# Patient Record
Sex: Female | Born: 1975 | Race: Black or African American | Hispanic: No | Marital: Single | Smoking: Former smoker
Health system: Southern US, Community
[De-identification: ages and names within clinical notes are randomized; demographics above are authoritative.]

## PROBLEM LIST (undated history)

## (undated) DIAGNOSIS — Z9289 Personal history of other medical treatment: Secondary | ICD-10-CM

## (undated) DIAGNOSIS — J309 Allergic rhinitis, unspecified: Secondary | ICD-10-CM

## (undated) DIAGNOSIS — F329 Major depressive disorder, single episode, unspecified: Secondary | ICD-10-CM

## (undated) DIAGNOSIS — D869 Sarcoidosis, unspecified: Secondary | ICD-10-CM

## (undated) DIAGNOSIS — K219 Gastro-esophageal reflux disease without esophagitis: Secondary | ICD-10-CM

## (undated) DIAGNOSIS — K279 Peptic ulcer, site unspecified, unspecified as acute or chronic, without hemorrhage or perforation: Secondary | ICD-10-CM

## (undated) DIAGNOSIS — J189 Pneumonia, unspecified organism: Secondary | ICD-10-CM

## (undated) DIAGNOSIS — R0602 Shortness of breath: Secondary | ICD-10-CM

## (undated) DIAGNOSIS — E119 Type 2 diabetes mellitus without complications: Secondary | ICD-10-CM

## (undated) DIAGNOSIS — I83819 Varicose veins of unspecified lower extremities with pain: Secondary | ICD-10-CM

## (undated) DIAGNOSIS — H6690 Otitis media, unspecified, unspecified ear: Secondary | ICD-10-CM

## (undated) DIAGNOSIS — D860 Sarcoidosis of lung: Secondary | ICD-10-CM

## (undated) DIAGNOSIS — M1990 Unspecified osteoarthritis, unspecified site: Secondary | ICD-10-CM

## (undated) DIAGNOSIS — F32A Depression, unspecified: Secondary | ICD-10-CM

## (undated) DIAGNOSIS — D649 Anemia, unspecified: Secondary | ICD-10-CM

## (undated) DIAGNOSIS — F419 Anxiety disorder, unspecified: Secondary | ICD-10-CM

## (undated) DIAGNOSIS — N920 Excessive and frequent menstruation with regular cycle: Secondary | ICD-10-CM

## (undated) DIAGNOSIS — J449 Chronic obstructive pulmonary disease, unspecified: Secondary | ICD-10-CM

## (undated) DIAGNOSIS — I10 Essential (primary) hypertension: Secondary | ICD-10-CM

## (undated) DIAGNOSIS — R768 Other specified abnormal immunological findings in serum: Secondary | ICD-10-CM

## (undated) DIAGNOSIS — E785 Hyperlipidemia, unspecified: Secondary | ICD-10-CM

## (undated) HISTORY — PX: WISDOM TOOTH EXTRACTION: SHX21

## (undated) HISTORY — DX: Hyperlipidemia, unspecified: E78.5

## (undated) HISTORY — DX: Allergic rhinitis, unspecified: J30.9

## (undated) HISTORY — DX: Gastro-esophageal reflux disease without esophagitis: K21.9

## (undated) HISTORY — DX: Morbid (severe) obesity due to excess calories: E66.01

## (undated) HISTORY — DX: Type 2 diabetes mellitus without complications: E11.9

## (undated) HISTORY — DX: Varicose veins of unspecified lower extremity with pain: I83.819

## (undated) HISTORY — DX: Essential (primary) hypertension: I10

## (undated) HISTORY — DX: Major depressive disorder, single episode, unspecified: F32.9

## (undated) HISTORY — DX: Anemia, unspecified: D64.9

## (undated) HISTORY — DX: Sarcoidosis, unspecified: D86.9

## (undated) HISTORY — PX: HERNIA REPAIR: SHX51

## (undated) HISTORY — DX: Peptic ulcer, site unspecified, unspecified as acute or chronic, without hemorrhage or perforation: K27.9

## (undated) HISTORY — DX: Otitis media, unspecified, unspecified ear: H66.90

## (undated) HISTORY — PX: ABDOMINAL HYSTERECTOMY: SHX81

## (undated) HISTORY — DX: Depression, unspecified: F32.A

## (undated) HISTORY — DX: Other specified abnormal immunological findings in serum: R76.8

## (undated) HISTORY — DX: Anxiety disorder, unspecified: F41.9

## (undated) HISTORY — DX: Excessive and frequent menstruation with regular cycle: N92.0

---

## 1998-05-07 ENCOUNTER — Emergency Department (HOSPITAL_COMMUNITY): Admission: EM | Admit: 1998-05-07 | Discharge: 1998-05-07 | Payer: Self-pay

## 1998-10-01 ENCOUNTER — Emergency Department (HOSPITAL_COMMUNITY): Admission: EM | Admit: 1998-10-01 | Discharge: 1998-10-02 | Payer: Self-pay | Admitting: Emergency Medicine

## 1999-02-22 ENCOUNTER — Emergency Department (HOSPITAL_COMMUNITY): Admission: EM | Admit: 1999-02-22 | Discharge: 1999-02-22 | Payer: Self-pay

## 1999-07-19 ENCOUNTER — Emergency Department (HOSPITAL_COMMUNITY): Admission: EM | Admit: 1999-07-19 | Discharge: 1999-07-19 | Payer: Self-pay | Admitting: Emergency Medicine

## 2000-03-15 ENCOUNTER — Emergency Department (HOSPITAL_COMMUNITY): Admission: EM | Admit: 2000-03-15 | Discharge: 2000-03-15 | Payer: Self-pay | Admitting: Emergency Medicine

## 2000-04-09 ENCOUNTER — Emergency Department (HOSPITAL_COMMUNITY): Admission: EM | Admit: 2000-04-09 | Discharge: 2000-04-09 | Payer: Self-pay | Admitting: Emergency Medicine

## 2000-06-19 ENCOUNTER — Emergency Department (HOSPITAL_COMMUNITY): Admission: EM | Admit: 2000-06-19 | Discharge: 2000-06-19 | Payer: Self-pay | Admitting: Emergency Medicine

## 2001-07-13 ENCOUNTER — Emergency Department (HOSPITAL_COMMUNITY): Admission: EM | Admit: 2001-07-13 | Discharge: 2001-07-13 | Payer: Self-pay | Admitting: Emergency Medicine

## 2001-11-20 ENCOUNTER — Emergency Department (HOSPITAL_COMMUNITY): Admission: EM | Admit: 2001-11-20 | Discharge: 2001-11-20 | Payer: Self-pay | Admitting: *Deleted

## 2003-10-26 ENCOUNTER — Emergency Department (HOSPITAL_COMMUNITY): Admission: EM | Admit: 2003-10-26 | Discharge: 2003-10-26 | Payer: Self-pay | Admitting: Emergency Medicine

## 2005-03-19 ENCOUNTER — Ambulatory Visit: Payer: Self-pay | Admitting: Internal Medicine

## 2005-04-15 ENCOUNTER — Ambulatory Visit: Payer: Self-pay | Admitting: Internal Medicine

## 2005-04-16 ENCOUNTER — Ambulatory Visit: Payer: Self-pay | Admitting: Internal Medicine

## 2005-06-24 ENCOUNTER — Ambulatory Visit: Payer: Self-pay | Admitting: Internal Medicine

## 2005-10-12 ENCOUNTER — Emergency Department (HOSPITAL_COMMUNITY): Admission: EM | Admit: 2005-10-12 | Discharge: 2005-10-12 | Payer: Self-pay | Admitting: Emergency Medicine

## 2006-03-14 ENCOUNTER — Ambulatory Visit: Payer: Self-pay | Admitting: Internal Medicine

## 2006-04-08 ENCOUNTER — Ambulatory Visit: Payer: Self-pay | Admitting: Internal Medicine

## 2006-09-09 ENCOUNTER — Emergency Department (HOSPITAL_COMMUNITY): Admission: EM | Admit: 2006-09-09 | Discharge: 2006-09-09 | Payer: Self-pay | Admitting: Emergency Medicine

## 2006-09-12 ENCOUNTER — Ambulatory Visit: Payer: Self-pay | Admitting: Internal Medicine

## 2006-10-06 ENCOUNTER — Ambulatory Visit: Payer: Self-pay | Admitting: Internal Medicine

## 2007-07-02 ENCOUNTER — Encounter: Payer: Self-pay | Admitting: Internal Medicine

## 2007-07-02 DIAGNOSIS — E739 Lactose intolerance, unspecified: Secondary | ICD-10-CM | POA: Insufficient documentation

## 2007-07-02 DIAGNOSIS — G43909 Migraine, unspecified, not intractable, without status migrainosus: Secondary | ICD-10-CM | POA: Insufficient documentation

## 2007-07-02 DIAGNOSIS — K279 Peptic ulcer, site unspecified, unspecified as acute or chronic, without hemorrhage or perforation: Secondary | ICD-10-CM | POA: Insufficient documentation

## 2007-07-02 DIAGNOSIS — K219 Gastro-esophageal reflux disease without esophagitis: Secondary | ICD-10-CM | POA: Insufficient documentation

## 2007-08-30 ENCOUNTER — Inpatient Hospital Stay (HOSPITAL_COMMUNITY): Admission: AD | Admit: 2007-08-30 | Discharge: 2007-08-31 | Payer: Self-pay | Admitting: Obstetrics & Gynecology

## 2007-09-01 ENCOUNTER — Ambulatory Visit (HOSPITAL_COMMUNITY): Admission: RE | Admit: 2007-09-01 | Discharge: 2007-09-01 | Payer: Self-pay | Admitting: Family Medicine

## 2007-09-13 ENCOUNTER — Encounter: Payer: Self-pay | Admitting: Obstetrics & Gynecology

## 2007-09-13 ENCOUNTER — Ambulatory Visit: Payer: Self-pay | Admitting: Obstetrics & Gynecology

## 2009-06-11 ENCOUNTER — Emergency Department (HOSPITAL_COMMUNITY): Admission: EM | Admit: 2009-06-11 | Discharge: 2009-06-11 | Payer: Self-pay | Admitting: Emergency Medicine

## 2009-12-11 ENCOUNTER — Ambulatory Visit: Payer: Self-pay | Admitting: Internal Medicine

## 2009-12-11 DIAGNOSIS — R062 Wheezing: Secondary | ICD-10-CM | POA: Insufficient documentation

## 2009-12-11 DIAGNOSIS — F17200 Nicotine dependence, unspecified, uncomplicated: Secondary | ICD-10-CM | POA: Insufficient documentation

## 2009-12-17 ENCOUNTER — Telehealth (INDEPENDENT_AMBULATORY_CARE_PROVIDER_SITE_OTHER): Payer: Self-pay | Admitting: *Deleted

## 2009-12-17 ENCOUNTER — Telehealth: Payer: Self-pay | Admitting: Internal Medicine

## 2009-12-17 DIAGNOSIS — F411 Generalized anxiety disorder: Secondary | ICD-10-CM | POA: Insufficient documentation

## 2009-12-17 DIAGNOSIS — D509 Iron deficiency anemia, unspecified: Secondary | ICD-10-CM | POA: Insufficient documentation

## 2009-12-17 DIAGNOSIS — E785 Hyperlipidemia, unspecified: Secondary | ICD-10-CM | POA: Insufficient documentation

## 2010-01-02 ENCOUNTER — Ambulatory Visit: Payer: Self-pay | Admitting: Internal Medicine

## 2010-01-16 ENCOUNTER — Encounter (INDEPENDENT_AMBULATORY_CARE_PROVIDER_SITE_OTHER): Payer: Self-pay | Admitting: *Deleted

## 2010-01-16 ENCOUNTER — Ambulatory Visit: Payer: Self-pay | Admitting: Internal Medicine

## 2010-01-16 DIAGNOSIS — H6690 Otitis media, unspecified, unspecified ear: Secondary | ICD-10-CM | POA: Insufficient documentation

## 2010-01-22 ENCOUNTER — Encounter: Payer: Self-pay | Admitting: Internal Medicine

## 2010-03-19 ENCOUNTER — Ambulatory Visit: Payer: Self-pay | Admitting: Internal Medicine

## 2010-03-25 HISTORY — PX: OTHER SURGICAL HISTORY: SHX169

## 2010-04-21 ENCOUNTER — Ambulatory Visit (HOSPITAL_COMMUNITY): Admission: RE | Admit: 2010-04-21 | Discharge: 2010-04-21 | Payer: Self-pay | Admitting: Obstetrics and Gynecology

## 2010-05-05 ENCOUNTER — Ambulatory Visit: Payer: Self-pay | Admitting: Internal Medicine

## 2010-05-05 DIAGNOSIS — R109 Unspecified abdominal pain: Secondary | ICD-10-CM | POA: Insufficient documentation

## 2010-05-05 DIAGNOSIS — R509 Fever, unspecified: Secondary | ICD-10-CM | POA: Insufficient documentation

## 2010-05-05 LAB — CONVERTED CEMR LAB
Bilirubin Urine: NEGATIVE
Leukocytes, UA: NEGATIVE
Total Protein, Urine: NEGATIVE mg/dL

## 2010-05-13 ENCOUNTER — Observation Stay (HOSPITAL_COMMUNITY): Admission: EM | Admit: 2010-05-13 | Discharge: 2010-05-13 | Payer: Self-pay | Admitting: Emergency Medicine

## 2010-05-15 ENCOUNTER — Ambulatory Visit: Payer: Self-pay | Admitting: Internal Medicine

## 2010-05-15 ENCOUNTER — Encounter (INDEPENDENT_AMBULATORY_CARE_PROVIDER_SITE_OTHER): Payer: Self-pay | Admitting: *Deleted

## 2010-05-15 DIAGNOSIS — J189 Pneumonia, unspecified organism: Secondary | ICD-10-CM | POA: Insufficient documentation

## 2010-05-15 DIAGNOSIS — N920 Excessive and frequent menstruation with regular cycle: Secondary | ICD-10-CM | POA: Insufficient documentation

## 2010-05-15 LAB — CONVERTED CEMR LAB
HCT: 29 % — ABNORMAL LOW (ref 36.0–46.0)
Hemoglobin: 9 g/dL — ABNORMAL LOW (ref 12.0–15.0)
Lymphocytes Relative: 15.7 % (ref 12.0–46.0)
Lymphs Abs: 1 10*3/uL (ref 0.7–4.0)
MCV: 61.6 fL — ABNORMAL LOW (ref 78.0–100.0)
Neutro Abs: 4.5 10*3/uL (ref 1.4–7.7)
Platelets: 535 10*3/uL — ABNORMAL HIGH (ref 150.0–400.0)
WBC: 6.4 10*3/uL (ref 4.5–10.5)

## 2010-05-19 ENCOUNTER — Encounter: Payer: Self-pay | Admitting: Internal Medicine

## 2010-05-19 ENCOUNTER — Ambulatory Visit: Payer: Self-pay | Admitting: Internal Medicine

## 2010-05-19 ENCOUNTER — Encounter (INDEPENDENT_AMBULATORY_CARE_PROVIDER_SITE_OTHER): Payer: Self-pay | Admitting: *Deleted

## 2010-05-19 DIAGNOSIS — B373 Candidiasis of vulva and vagina: Secondary | ICD-10-CM | POA: Insufficient documentation

## 2010-05-19 DIAGNOSIS — R197 Diarrhea, unspecified: Secondary | ICD-10-CM | POA: Insufficient documentation

## 2010-05-19 LAB — CONVERTED CEMR LAB
ALT: 26 units/L (ref 0–35)
Albumin: 3.6 g/dL (ref 3.5–5.2)
Basophils Relative: 0.9 % (ref 0.0–3.0)
Calcium: 9 mg/dL (ref 8.4–10.5)
Chloride: 111 meq/L (ref 96–112)
GFR calc non Af Amer: 107.25 mL/min (ref >60)
HCT: 27.6 % — ABNORMAL LOW (ref 36.0–46.0)
Hemoglobin: 8.6 g/dL — ABNORMAL LOW (ref 12.0–15.0)
Hgb A1c MFr Bld: 5.8 % (ref 4.6–6.5)
Lymphocytes Relative: 18.3 % (ref 12.0–46.0)
MCV: 61.4 fL — ABNORMAL LOW (ref 78.0–100.0)
Monocytes Relative: 6.1 % (ref 3.0–12.0)
Neutro Abs: 6.1 10*3/uL (ref 1.4–7.7)
Nitrite: NEGATIVE
Platelets: 432 10*3/uL — ABNORMAL HIGH (ref 150.0–400.0)
Potassium: 3.9 meq/L (ref 3.5–5.1)
RBC: 4.49 M/uL (ref 3.87–5.11)
RDW: 21 % — ABNORMAL HIGH (ref 11.5–14.6)
Sodium: 138 meq/L (ref 135–145)
Specific Gravity, Urine: >=1.03 (ref 1.000–1.030)
Total Protein: 7.3 g/dL (ref 6.0–8.3)
Urobilinogen, UA: 0.2 (ref 0.0–1.0)

## 2010-05-20 ENCOUNTER — Ambulatory Visit: Payer: Self-pay | Admitting: Internal Medicine

## 2010-05-21 ENCOUNTER — Encounter: Payer: Self-pay | Admitting: Internal Medicine

## 2010-05-21 ENCOUNTER — Ambulatory Visit: Payer: Self-pay | Admitting: Pulmonary Disease

## 2010-05-21 DIAGNOSIS — R599 Enlarged lymph nodes, unspecified: Secondary | ICD-10-CM | POA: Insufficient documentation

## 2010-05-21 DIAGNOSIS — J8409 Other alveolar and parieto-alveolar conditions: Secondary | ICD-10-CM | POA: Insufficient documentation

## 2010-05-28 ENCOUNTER — Ambulatory Visit: Payer: Self-pay | Admitting: Internal Medicine

## 2010-05-28 ENCOUNTER — Telehealth (INDEPENDENT_AMBULATORY_CARE_PROVIDER_SITE_OTHER): Payer: Self-pay | Admitting: *Deleted

## 2010-05-28 ENCOUNTER — Encounter (INDEPENDENT_AMBULATORY_CARE_PROVIDER_SITE_OTHER): Payer: Self-pay | Admitting: *Deleted

## 2010-05-28 ENCOUNTER — Encounter: Payer: Self-pay | Admitting: Pulmonary Disease

## 2010-05-28 DIAGNOSIS — R0602 Shortness of breath: Secondary | ICD-10-CM | POA: Insufficient documentation

## 2010-05-28 LAB — CONVERTED CEMR LAB
ALT: 20 units/L (ref 0–35)
AST: 23 units/L (ref 0–37)
Albumin: 3.7 g/dL (ref 3.5–5.2)
Alkaline Phosphatase: 65 units/L (ref 39–117)
Basophils Absolute: 0.1 10*3/uL (ref 0.0–0.1)
Basophils Relative: 0.9 % (ref 0.0–3.0)
Bilirubin, Direct: 0.2 mg/dL (ref 0.0–0.3)
CO2: 21 meq/L (ref 19–32)
Chloride: 107 meq/L (ref 96–112)
Eosinophils Absolute: 0.1 10*3/uL (ref 0.0–0.7)
HCT: 28.6 % — ABNORMAL LOW (ref 36.0–46.0)
Hemoglobin: 9 g/dL — ABNORMAL LOW (ref 12.0–15.0)
Lymphs Abs: 0.9 10*3/uL (ref 0.7–4.0)
Neutrophils Relative %: 81.1 % — ABNORMAL HIGH (ref 43.0–77.0)
Platelets: 365 10*3/uL (ref 150.0–400.0)
Potassium: 4.3 meq/L (ref 3.5–5.1)
RBC: 4.7 M/uL (ref 3.87–5.11)
Sed Rate: 36 mm/hr — ABNORMAL HIGH (ref 0–22)
Sodium: 138 meq/L (ref 135–145)
Total Protein: 7.5 g/dL (ref 6.0–8.3)
WBC: 8.8 10*3/uL (ref 4.5–10.5)

## 2010-06-03 ENCOUNTER — Ambulatory Visit: Payer: Self-pay | Admitting: Pulmonary Disease

## 2010-06-03 ENCOUNTER — Ambulatory Visit: Admission: RE | Admit: 2010-06-03 | Discharge: 2010-06-03 | Payer: Self-pay | Admitting: Pulmonary Disease

## 2010-06-08 ENCOUNTER — Ambulatory Visit: Payer: Self-pay | Admitting: Pulmonary Disease

## 2010-06-08 DIAGNOSIS — D869 Sarcoidosis, unspecified: Secondary | ICD-10-CM | POA: Insufficient documentation

## 2010-06-19 ENCOUNTER — Ambulatory Visit: Payer: Self-pay | Admitting: Internal Medicine

## 2010-06-19 DIAGNOSIS — G4700 Insomnia, unspecified: Secondary | ICD-10-CM | POA: Insufficient documentation

## 2010-06-24 ENCOUNTER — Ambulatory Visit: Payer: Self-pay | Admitting: Internal Medicine

## 2010-06-28 ENCOUNTER — Emergency Department (HOSPITAL_COMMUNITY): Admission: EM | Admit: 2010-06-28 | Discharge: 2010-06-28 | Payer: Self-pay | Admitting: Family Medicine

## 2010-07-07 ENCOUNTER — Telehealth: Payer: Self-pay | Admitting: Internal Medicine

## 2010-07-07 ENCOUNTER — Ambulatory Visit: Payer: Self-pay | Admitting: Internal Medicine

## 2010-07-07 DIAGNOSIS — R10816 Epigastric abdominal tenderness: Secondary | ICD-10-CM | POA: Insufficient documentation

## 2010-07-07 DIAGNOSIS — R350 Frequency of micturition: Secondary | ICD-10-CM | POA: Insufficient documentation

## 2010-07-07 DIAGNOSIS — B370 Candidal stomatitis: Secondary | ICD-10-CM | POA: Insufficient documentation

## 2010-07-07 LAB — CONVERTED CEMR LAB
Albumin: 4.1 g/dL (ref 3.5–5.2)
Basophils Relative: 0.2 % (ref 0.0–3.0)
Bilirubin Urine: NEGATIVE
Bilirubin, Direct: 0.1 mg/dL (ref 0.0–0.3)
Eosinophils Relative: 1 % (ref 0.0–5.0)
Glucose, Bld: 621 mg/dL (ref 70–99)
Hemoglobin: 10.8 g/dL — ABNORMAL LOW (ref 12.0–15.0)
Iron: 28 ug/dL — ABNORMAL LOW (ref 42–145)
Ketones, ur: NEGATIVE mg/dL
Leukocytes, UA: NEGATIVE
MCHC: 31.8 g/dL (ref 30.0–36.0)
Neutrophils Relative %: 81.7 % — ABNORMAL HIGH (ref 43.0–77.0)
Nitrite: NEGATIVE
Platelets: 354 10*3/uL (ref 150.0–400.0)
Potassium: 4.3 meq/L (ref 3.5–5.1)
RBC: 5.25 M/uL — ABNORMAL HIGH (ref 3.87–5.11)
RDW: 23.1 % — ABNORMAL HIGH (ref 11.5–14.6)
Saturation Ratios: 5.7 % — ABNORMAL LOW (ref 20.0–50.0)
Sed Rate: 16 mm/hr (ref 0–22)
Sodium: 136 meq/L (ref 135–145)
Total Bilirubin: 0.5 mg/dL (ref 0.3–1.2)
Transferrin: 349.2 mg/dL (ref 212.0–360.0)
Urobilinogen, UA: 0.2 (ref 0.0–1.0)
WBC: 6.7 10*3/uL (ref 4.5–10.5)
pH: 5.5 (ref 5.0–8.0)

## 2010-07-09 ENCOUNTER — Encounter: Payer: Self-pay | Admitting: Pulmonary Disease

## 2010-07-10 ENCOUNTER — Ambulatory Visit: Payer: Self-pay | Admitting: Pulmonary Disease

## 2010-07-26 ENCOUNTER — Encounter: Payer: Self-pay | Admitting: Internal Medicine

## 2010-07-26 ENCOUNTER — Emergency Department (HOSPITAL_COMMUNITY): Admission: EM | Admit: 2010-07-26 | Discharge: 2010-07-26 | Payer: Self-pay | Admitting: Emergency Medicine

## 2010-07-31 ENCOUNTER — Encounter (INDEPENDENT_AMBULATORY_CARE_PROVIDER_SITE_OTHER): Payer: Self-pay | Admitting: *Deleted

## 2010-07-31 ENCOUNTER — Encounter: Payer: Self-pay | Admitting: Internal Medicine

## 2010-07-31 ENCOUNTER — Ambulatory Visit: Payer: Self-pay | Admitting: Internal Medicine

## 2010-07-31 ENCOUNTER — Telehealth: Payer: Self-pay | Admitting: Internal Medicine

## 2010-07-31 DIAGNOSIS — R609 Edema, unspecified: Secondary | ICD-10-CM | POA: Insufficient documentation

## 2010-07-31 DIAGNOSIS — J019 Acute sinusitis, unspecified: Secondary | ICD-10-CM | POA: Insufficient documentation

## 2010-07-31 DIAGNOSIS — E1140 Type 2 diabetes mellitus with diabetic neuropathy, unspecified: Secondary | ICD-10-CM | POA: Insufficient documentation

## 2010-07-31 LAB — CONVERTED CEMR LAB
CO2: 24 meq/L (ref 19–32)
Cholesterol: 157 mg/dL (ref 0–200)
Eosinophils Absolute: 0.1 10*3/uL (ref 0.0–0.7)
Eosinophils Relative: 1.9 % (ref 0.0–5.0)
HCT: 25.8 % — ABNORMAL LOW (ref 36.0–46.0)
Hemoglobin: 8.2 g/dL — ABNORMAL LOW (ref 12.0–15.0)
Hgb A1c MFr Bld: 9.9 % — ABNORMAL HIGH (ref 4.6–6.5)
MCV: 65 fL — ABNORMAL LOW (ref 78.0–100.0)
Monocytes Absolute: 0.4 10*3/uL (ref 0.1–1.0)
Monocytes Relative: 5.7 % (ref 3.0–12.0)
Platelets: 349 10*3/uL (ref 150.0–400.0)
Potassium: 3.5 meq/L (ref 3.5–5.1)
RDW: 23.3 % — ABNORMAL HIGH (ref 11.5–14.6)
Saturation Ratios: 2.7 % — ABNORMAL LOW (ref 20.0–50.0)
Transferrin: 348.9 mg/dL (ref 212.0–360.0)
VLDL: 21.2 mg/dL (ref 0.0–40.0)
WBC: 7.6 10*3/uL (ref 4.5–10.5)

## 2010-08-03 ENCOUNTER — Encounter (HOSPITAL_COMMUNITY)
Admission: RE | Admit: 2010-08-03 | Discharge: 2010-10-23 | Payer: Self-pay | Source: Home / Self Care | Attending: Internal Medicine | Admitting: Internal Medicine

## 2010-08-10 ENCOUNTER — Ambulatory Visit: Payer: Self-pay

## 2010-08-15 ENCOUNTER — Ambulatory Visit: Payer: Self-pay | Admitting: Family Medicine

## 2010-08-15 DIAGNOSIS — R5381 Other malaise: Secondary | ICD-10-CM | POA: Insufficient documentation

## 2010-08-15 DIAGNOSIS — R5383 Other fatigue: Secondary | ICD-10-CM

## 2010-09-01 ENCOUNTER — Ambulatory Visit: Payer: Self-pay | Admitting: Internal Medicine

## 2010-09-02 LAB — CONVERTED CEMR LAB
BUN: 6 mg/dL (ref 6–23)
CO2: 24 meq/L (ref 19–32)
Eosinophils Absolute: 0.4 10*3/uL (ref 0.0–0.7)
GFR calc non Af Amer: 123.35 mL/min (ref >60)
Hemoglobin: 9.7 g/dL — ABNORMAL LOW (ref 12.0–15.0)
Lymphocytes Relative: 14.4 % (ref 12.0–46.0)
Lymphs Abs: 1 10*3/uL (ref 0.7–4.0)
Neutrophils Relative %: 73.5 % (ref 43.0–77.0)
Platelets: 344 10*3/uL (ref 150.0–400.0)
Potassium: 3.4 meq/L — ABNORMAL LOW (ref 3.5–5.1)
WBC: 6.8 10*3/uL (ref 4.5–10.5)

## 2010-09-10 ENCOUNTER — Ambulatory Visit: Payer: Self-pay | Admitting: Pulmonary Disease

## 2010-09-10 DIAGNOSIS — J209 Acute bronchitis, unspecified: Secondary | ICD-10-CM | POA: Insufficient documentation

## 2010-09-14 ENCOUNTER — Encounter (INDEPENDENT_AMBULATORY_CARE_PROVIDER_SITE_OTHER): Payer: Self-pay | Admitting: *Deleted

## 2010-09-14 ENCOUNTER — Ambulatory Visit: Payer: Self-pay | Admitting: Internal Medicine

## 2010-09-14 DIAGNOSIS — F32A Depression, unspecified: Secondary | ICD-10-CM | POA: Insufficient documentation

## 2010-09-14 DIAGNOSIS — M2550 Pain in unspecified joint: Secondary | ICD-10-CM | POA: Insufficient documentation

## 2010-09-14 DIAGNOSIS — F329 Major depressive disorder, single episode, unspecified: Secondary | ICD-10-CM

## 2010-09-14 LAB — CONVERTED CEMR LAB: Sed Rate: 37 mm/hr — ABNORMAL HIGH (ref 0–22)

## 2010-09-25 ENCOUNTER — Emergency Department (HOSPITAL_COMMUNITY)
Admission: EM | Admit: 2010-09-25 | Discharge: 2010-09-25 | Payer: Self-pay | Source: Home / Self Care | Admitting: Emergency Medicine

## 2010-09-28 ENCOUNTER — Emergency Department (HOSPITAL_COMMUNITY)
Admission: EM | Admit: 2010-09-28 | Discharge: 2010-09-28 | Payer: Self-pay | Source: Home / Self Care | Admitting: Emergency Medicine

## 2010-09-28 ENCOUNTER — Telehealth: Payer: Self-pay | Admitting: Internal Medicine

## 2010-09-28 ENCOUNTER — Encounter: Payer: Self-pay | Admitting: Internal Medicine

## 2010-09-29 ENCOUNTER — Ambulatory Visit: Payer: Self-pay | Admitting: Internal Medicine

## 2010-09-29 DIAGNOSIS — L509 Urticaria, unspecified: Secondary | ICD-10-CM | POA: Insufficient documentation

## 2010-09-30 ENCOUNTER — Ambulatory Visit: Payer: Self-pay | Admitting: Internal Medicine

## 2010-09-30 ENCOUNTER — Encounter: Payer: Self-pay | Admitting: Internal Medicine

## 2010-10-01 ENCOUNTER — Telehealth: Payer: Self-pay | Admitting: Internal Medicine

## 2010-10-01 ENCOUNTER — Encounter (INDEPENDENT_AMBULATORY_CARE_PROVIDER_SITE_OTHER): Payer: Self-pay | Admitting: *Deleted

## 2010-10-02 ENCOUNTER — Ambulatory Visit: Payer: Self-pay | Admitting: Internal Medicine

## 2010-10-09 ENCOUNTER — Ambulatory Visit: Payer: Self-pay | Admitting: Pulmonary Disease

## 2010-11-15 ENCOUNTER — Encounter: Payer: Self-pay | Admitting: Internal Medicine

## 2010-11-20 ENCOUNTER — Encounter: Payer: Self-pay | Admitting: Internal Medicine

## 2010-11-24 NOTE — Progress Notes (Signed)
Summary: bronch rescheduled to 06-03-2010   ---- 05/27/2010 3:54 PM, Arman Filter LPN wrote: Nicole Melendez, this is the pt that was scheduled for tomorrow for a bronch that was cancelled.  do you want me to go ahead and call her to get her rescheduled?  if so, let me know of a date and time and i will schedule it.  ------------------------------  ---- Converted from flag ---- ---- 05/28/2010 8:37 AM, Barbaraann Share MD wrote: thanks Nicole Millet.  the other bronch could be scheduled on wed or thurs am at 730 t  called and spoke with Nicole Melendez from cardiopulmonary and rescheduled pt's bronch to [facility] August 10 at 7:30am.  called and spoke with pt and informed her of the new appt date for bronch, to arrive at [facility] outpatient admitting at 6:15am,  NPO after midnight, and she will need to have someone there with her to drive her home.  Pt verbalized understanding.  Pt requested we send a doctors note to her job at [phone] informing them that she will not be at work on 06-03-2010 d/t medical reasons/procedure.  Will forward message to [facility] to make sure he is ok to send note.  Nicole Millet Reynolds LPN  May 28, 2010 10:41 AM   ok with me.     faxed dr's note to # above.  Nicole Millet Reynolds LPN  May 28, 2010 2:08 PM

## 2010-11-24 NOTE — Letter (Signed)
Summary: Out of Work  [facility]  [address] [place], [place] [address]   Phone: [phone]  Fax: [phone]    May 28, 2010   Employee:  [employer]    To Whom It May Concern:   For Medical reasons, please excuse the above named employee from work for the following dates:  Start:   05/27/2010  End:   06/01/2010  Return to work 06/01/2010  If you need additional information, please feel free to contact our office.         Sincerely,    Dr. [name]

## 2010-11-24 NOTE — Assessment & Plan Note (Signed)
Summary: SARCODOSIS IN HAND PER RHEUMATOLOGIST/HIVES ALL OVER AND HAND.Marland KitchenMarland Kitchen   Vital Signs:  Patient profile:   35 year old female Height:      67 inches Weight:      295.50 pounds BMI:     46.45 O2 Sat:      97 % on Room air Temp:     99.1 degrees F oral Pulse rate:   88 / minute Pulse rhythm:   regular BP sitting:   104 / 68  (left arm) Cuff size:   large  Vitals Entered By: Rock Nephew CMA (September 29, 2010 1:59 PM)  O2 Flow:  Room air CC: Patient c/o hives all over body which has flared up the sarcardosis in both hands  Does patient need assistance? Functional Status Self care Ambulation Normal   Primary Care Provider:  [name] MD  CC:  Patient c/o hives all over body which has flared up the sarcardosis in both hands.  History of Present Illness: complicated pt - here for ER f/u  seen 09/25/10 for bronchitis - given Zpack then 09/28/10 developed migrating hives and hand swelling - seens in ER x 2 on 12/5 - 1st no tx as hives had improved, 2nd given pepcid and benadryl - cont rash and itch and hand swelling -  no mouth swelling or trouble breathing  also needs work note stating intermittent abscence due to medical illness  reviewed prior OV as well: here to f/u;  under more stress lately as recently step sister died realtively suddently with breast cancer, as well as much more anixety with recent reading about sarcoidosis on the internet and all the possible end organ damage;  saw dr clance with bronchtis per pt last wk, finished the avelox and has small non prod cough residual, but Pt denies CP, worsening sob, doe, wheezing, orthopnea, pnd, worsening LE edema, palps, dizziness or syncope Pt denies new neuro symptoms such as headache, facial or extremity weakness Pt denies polydipsia, polyuria, or low sugar symptoms such as shakiness improved with eating.  Overall good compliance with meds, trying to follow low chol, DM diet, wt stable, little excercise however CBG's  in the lower 100s on current meds  Unfortunately today has what appears to be new issue of  significant polyarthralgias/arthritis -  hand/wrists and ankles primarily, with 2-3 wk increased marked Increaesd AM stiffness and pain, better with running hands in hot water, and improved after 1 hr. still with swelling to LLE (I think prob some venous insufficiency)but  has tender LLE to the swollen diffuse area of the ankle Left > right  also mentions increased depressive symtpoms in the past month with lower mood, high anxiety, anhedonia, wt increase, sleeping more, less motivated on the job though not in trouble and not missed work;  not looking forward or enjoying anymore;  Denies  suicidal ideation, or panic.   Also no overt bruising or bleeding.   Clinical Review Panels:  CBC   WBC:  6.8 (09/01/2010)   RBC:  4.44 (09/01/2010)   Hgb:  9.7 (09/01/2010)   Hct:  30.0 (09/01/2010)   Platelets:  344.0 (09/01/2010)   MCV  67.6 (09/01/2010)   MCHC  32.2 (09/01/2010)   RDW  23.1 (09/01/2010)   PMN:  73.5 (09/01/2010)   Lymphs:  14.4 (09/01/2010)   Monos:  6.2 (09/01/2010)   Eosinophils:  5.6 (09/01/2010)   Basophil:  0.3 (09/01/2010)  Complete Metabolic Panel   Glucose:  94 (09/01/2010)   Sodium:  140 (09/01/2010)   Potassium:  3.4 (09/01/2010)   Chloride:  107 (09/01/2010)   CO2:  24 (09/01/2010)   BUN:  6 (09/01/2010)   Creatinine:  0.7 (09/01/2010)   Albumin:  4.1 (07/07/2010)   Total Protein:  7.2 (07/07/2010)   Calcium:  9.2 (09/01/2010)   Total Bili:  0.5 (07/07/2010)   Alk Phos:  116 (07/07/2010)   SGPT (ALT):  32 (07/07/2010)   SGOT (AST):  19 (07/07/2010)   Current Medications (verified): 1)  Omeprazole 20 Mg Cpdr (Omeprazole) .... 2 By Mouth Once Daily 2)  Meclizine Hcl 12.5 Mg Tabs (Meclizine Hcl) .Marland Kitchen.. 1po Q 6 Hrs As Needed Dizzy 3)  Alprazolam 0.5 Mg Tabs (Alprazolam) .Marland Kitchen.. 1 By Mouth Two Times A Day As Needed 4)  Fluoxetine Hcl 40 Mg Caps (Fluoxetine Hcl) .Marland Kitchen.. 1 By Mouth  Once Daily 5)  Symbicort 160-4.5 Mcg/act Aero (Budesonide-Formoterol Fumarate) .... 2 Puffs Two Times A Day 6)  Proair Hfa 108 (90 Base) Mcg/act Aers (Albuterol Sulfate) .... 2 Puffs Four Times Per Day As Needed 7)  Zolpidem Tartrate 12.5 Mg Cr-Tabs (Zolpidem Tartrate) .Marland Kitchen.. 1 By Mouth At Bedtime As Needed 8)  Onetouch Test  Strp (Glucose Blood) .... Use Asd Four Times Per Day 9)  Lancets  Misc (Lancets) .... Use Asd Four Times Per Day 10)  Janumet 50-500 Mg Tabs (Sitagliptin-Metformin Hcl) .Marland Kitchen.. 1poi Two Times A Day 11)  Ferrous Sulfate 325 (65 Fe) Mg Tbec (Ferrous Sulfate) .Marland Kitchen.. 1po Two Times A Day 12)  Furosemide 20 Mg Tabs (Furosemide) .... Take 1 Tab By Mouth Every Day 13)  Tylenol Arthritis Pain 650 Mg Cr-Tabs (Acetaminophen) .Marland Kitchen.. 1-2 By Mouth Three Times A Day As Needed 14)  Celebrex 200 Mg Caps (Celecoxib) .... Take 1 Tablet By Mouth Once A Day As Needed  Allergies (verified): 1)  ! Pcn 2)  ! Percocet  Past History:  Past Medical History: Morbid Obesity Peptic ulcer disease GERD Migraine Anxiety Anemia-iron deficiency Hyperlipidemia menorrhoagia PNA episode july 2011 GYN:  Dr Rosemary Holms Diabetes mellitus, type II - steroid related Depression sarcoid  Review of Systems  The patient denies fever, chest pain, headaches, and hemoptysis.    Physical Exam  General:  NAD, alert and overweight-appearing.  sister (?friend) at side Eyes:  vision grossly intact; pupils equal, round and reactive to light.  conjunctiva and lids normal.    Lungs:  normal respiratory effort, no intercostal retractions or use of accessory muscles; normal breath sounds bilaterally - no crackles and no wheezes.    Heart:  normal rate, regular rhythm, no murmur, and no rub. BLE without edema. Skin:  hives B forearms and trunk - swelling diffuse of B hands/digits   Impression & Recommendations:  Problem # 1:  URTICARIA (ICD-708.9)  ?azith rxn - no other new exposure triggers identified on hx pred pak x  6 days (watch DM), cont antihist as ongoing close f/u PCP in next 72h to review and ensure resolution note provided as requested  Orders: Prescription Created Electronically [phone])  Problem # 2:  PULMONARY SARCOIDOSIS (ICD-135) defer mgmt of sarcoid symptoms to pulm and rheum as ongoing -  f/u PCP re: labs as needed (ANA not done prev?) note provided to pt as requested re: intermit abscences related to medical flares of sarcoid and DM - suspect FMLA needed - defer same to PCP  Problem # 3:  DIABETES MELLITUS, TYPE II (ICD-250.00) pt to watch for exac by steroids for urticaria - call if cbg>200 Her updated  medication list for this problem includes:    Janumet 50-500 Mg Tabs (Sitagliptin-metformin hcl) .Marland Kitchen... 1poi two times a day  Labs Reviewed: Creat: 0.7 (09/01/2010)    Reviewed HgBA1c results: 9.9 (07/31/2010)  10.8 (07/07/2010)  Complete Medication List: 1)  Omeprazole 20 Mg Cpdr (Omeprazole) .... 2 by mouth once daily 2)  Meclizine Hcl 12.5 Mg Tabs (Meclizine hcl) .Marland Kitchen.. 1po q 6 hrs as needed dizzy 3)  Alprazolam 0.5 Mg Tabs (Alprazolam) .Marland Kitchen.. 1 by mouth two times a day as needed 4)  Fluoxetine Hcl 40 Mg Caps (Fluoxetine hcl) .Marland Kitchen.. 1 by mouth once daily 5)  Symbicort 160-4.5 Mcg/act Aero (Budesonide-formoterol fumarate) .... 2 puffs two times a day 6)  Proair Hfa 108 (90 Base) Mcg/act Aers (Albuterol sulfate) .... 2 puffs four times per day as needed 7)  Zolpidem Tartrate 12.5 Mg Cr-tabs (Zolpidem tartrate) .Marland Kitchen.. 1 by mouth at bedtime as needed 8)  Onetouch Test Strp (Glucose blood) .... Use asd four times per day 9)  Lancets Misc (Lancets) .... Use asd four times per day 10)  Janumet 50-500 Mg Tabs (Sitagliptin-metformin hcl) .Marland Kitchen.. 1poi two times a day 11)  Ferrous Sulfate 325 (65 Fe) Mg Tbec (Ferrous sulfate) .Marland Kitchen.. 1po two times a day 12)  Furosemide 20 Mg Tabs (Furosemide) .... Take 1 tab by mouth every day 13)  Tylenol Arthritis Pain 650 Mg Cr-tabs (Acetaminophen) .Marland Kitchen.. 1-2 by  mouth three times a day as needed 14)  Celebrex 200 Mg Caps (Celecoxib) .... Take 1 tablet by mouth once a day as needed 15)  Prednisone (pak) 10 Mg Tabs (Prednisone) .... As directed x 6 days  Patient Instructions: 1)  it was good to see you today. 2)  Pred pak x 6 days - your prescriptions have been electronically submitted to your pharmacy. Please take as directed. Contact our office if you believe you're having problems with the medication(s).  3)  stop azith and continue benadryl every 4 hours as needed for other itch/hives symptoms  4)  work note as discussed - followup with dr. Jonny Ruiz on other paperwork as needed 5)  Please schedule a follow-up appointment in 48-72h with Dr. Jonny Ruiz, call sooner if problems.  Prescriptions: PREDNISONE (PAK) 10 MG TABS (PREDNISONE) as directed x 6 days  #1 pak x 0   Entered and Authorized by:   Newt Lukes MD   Signed by:   Newt Lukes MD on 09/29/2010   Method used:   Electronically to        [facility] [phone]* (retail)       [address]       [address]       [place], [place]  [address]       Ph: [phone]       Fax: [phone]   RxID:   [id number]    Orders Added: 1)  Est. Patient Level IV [44010] 2)  Prescription Created Electronically [id number]

## 2010-11-24 NOTE — Assessment & Plan Note (Signed)
Summary: consult for pulmonary infiltrates, mediastinal LN   Copy to:  [name] Primary Provider/Referring Provider:  [name] MD  CC:  Pulmonary Consult for pneumonia. Marland Kitchen  History of Present Illness: The pt is a 35y/o female who I have been asked to see for persistent pulmonary symptoms and abnormal ct chest.  She feels that she has been ill since Feb of this year, with ongoing cough, as well as sinus and chest congestion.  She has been treated with levaquin, azithro, and doxycycline, and has not gotten back to baseline. She also was treated with steroids prior to this summer.   She recently had a panic attack with chest tightness, and went to ER a week ago where she was diagnosed with pna by cxr.  She was admitted, and a chest ct during that admit showed mediastinal and hilar LN, as well as bilateral pulmonary infiltrates with sparing deep in the bases.  She was treated with IV abx, and sent home on orals, but didn't feel any better.  She still had sob, cough that was dry, but no fever at the time of discharge.  She currently is still coughing, but is not producing mucus.  Her breathing is better, but not back to baseline.  She is on a bronchodilator regimen, but unsure if they have been helpful.  She denies any fevers, chills, or sweats.  She does have a longstanding history of smoking, but has not done so since her hospitalization.  Current Medications (verified): 1)  Omeprazole 20 Mg Cpdr (Omeprazole) .... 2 By Mouth Once Daily 2)  Meclizine Hcl 12.5 Mg Tabs (Meclizine Hcl) .Marland Kitchen.. 1po Q 6 Hrs As Needed Dizzy 3)  Alprazolam 0.25 Mg Tabs (Alprazolam) .... 1/2 - 1 By Mouth Once Daily As Needed 4)  Oxycodone Hcl 5 Mg Tabs (Oxycodone Hcl) .Marland Kitchen.. 1po Q 6 Hrs As Needed Pain 5)  Fluoxetine Hcl 20 Mg Caps (Fluoxetine Hcl) .Marland Kitchen.. 1 By Mouth Once Daily 6)  Tessalon Perles 100 Mg Caps (Benzonatate) .Marland Kitchen.. 1-2 By Mouth Three Times A Day As Needed Cough 7)  Symbicort 160-4.5 Mcg/act Aero (Budesonide-Formoterol  Fumarate) .... 2 Puffs Two Times A Day 8)  Proair Hfa 108 (90 Base) Mcg/act Aers (Albuterol Sulfate) .... 2 Puffs Four Times Per Day As Needed 9)  Metronidazole 250 Mg Tabs (Metronidazole) .Marland Kitchen.. 1 By Mouth Qid For 10 Days 10)  Fluconazole 150 Mg Tabs (Fluconazole) .Marland Kitchen.. 1po Every Other Day As Needed Yeast Infection 11)  Norethindrone Acetate 5 Mg Tabs (Norethindrone Acetate) .... Take 3 Tabs By Mouth Daily As Needed For Bleeding  Allergies (verified): 1)  ! Pcn 2)  ! Percocet  Past History:  Past Medical History: Reviewed history from 05/15/2010 and no changes required. Morbid Obesity Glucose Intolerance Peptic ulcer disease GERD Migraine Anxiety Anemia-iron deficiency glucose intolerance Hyperlipidemia menorrhoagia PNA episode july 2011  GYN:  Dr Rosemary Holms  Past Surgical History: Reviewed history from 05/05/2010 and no changes required. s/p uterine ablation  April 21, 2010  Family History: Reviewed history from 12/11/2009 and no changes required. DM HTN  allergies: mother heart disease: mother (m.i.) , father cancer: maternal aunts ( ovarian, lung, breast, bone)   Social History: Reviewed history from 05/15/2010 and no changes required. pt is single and lives with Dalbert Batman pt has no children. pt works as a Human resources officer. former smoker.  started at age 63.  less than 1 ppd.  quit July 2011. Alcohol use-yes Drug use-no  Review of Systems  The patient complains of shortness of breath with activity, non-productive cough, chest pain, weight change, abdominal pain, sore throat, tooth/dental problems, and anxiety.  The patient denies shortness of breath at rest, productive cough, coughing up blood, irregular heartbeats, acid heartburn, indigestion, loss of appetite, difficulty swallowing, headaches, nasal congestion/difficulty breathing through nose, sneezing, itching, ear ache, depression, hand/feet swelling, joint stiffness or pain, rash, change in color of mucus,  and fever.    Vital Signs:  Patient profile:   35 year old female Height:      67 inches Weight:      306.13 pounds BMI:     48.12 O2 Sat:      97 % on Room air Temp:     98.2 degrees F oral Pulse rate:   89 / minute BP sitting:   114 / 68  (right arm) Cuff size:   large  Vitals Entered By: Arman Filter LPN (May 21, 2010 10:09 AM)  O2 Flow:  Room air CC: Pulmonary Consult for pneumonia.  Comments Medications reviewed with patient Arman Filter LPN  May 21, 2010 10:17 AM    Physical Exam  General:  obese female in nad Eyes:  PERRLA and EOMI.   Nose:  patent without discharge Mouth:  clear Neck:  no jvd, tmg, LN Lungs:  minimal scattered crackles, no wheezing or rhonchi Heart:  rrr, no mrg Abdomen:  soft and nontender, bs+ Extremities:  no edema or cyanosis pulses intact distally Neurologic:  alert and oriented, moves all 4.   Impression & Recommendations:  Problem # 1:  UNSPEC ALVEOLAR&PARIETOALVEOLAR PNEUMONOPATHY (ICD-516.9)  The pt has pulmonary infiltrates that have not responded to oral and IV abx.  I suspect this is not infectious in origin, but inflammatory.  Possibilites include BOOP, eosinophilic pna, sarcoid, etc.  I suspect this is not a malignancy.  The pt will need bronchoscopy with biopsy and BAL to help with the diagnosis.  The pt is agreeable.  I have discussed the risks of bleeding, ptx, and respiratory distress with her.  Problem # 2:  MEDIASTINAL LYMPHADENOPATHY (ICD-785.6)  It is unclear if this is reactive, or is a part of the actual process.  Certainly sarcoid could be a unifying diagnosis.  Medications Added to Medication List This Visit: 1)  Norethindrone Acetate 5 Mg Tabs (Norethindrone acetate) .... Take 3 tabs by mouth daily as needed for bleeding  Other Orders: Consultation Level V (16109)  Patient Instructions: 1)  will do bronchoscopy to look into your lungs next week for diagnosis 2)  no aspirin 3 days before procedure.

## 2010-11-24 NOTE — Progress Notes (Signed)
----   Converted from flag ---- ---- 12/16/2009 8:18 PM, [name] MD wrote: need old paper chart please ------------------------------

## 2010-11-24 NOTE — Progress Notes (Signed)
Summary: Work note  Phone Note Call from Patient Call back at [employer] [phone]   Caller: Patient Summary of Call: Pt called requesting new work note to include yesterday Dec 8th. Pt states she was not able to go to work this day either due to rash and advised MD of this at OV. Fax to [phone] Initial call taken by: Margaret Pyle, CMA,  October 01, 2010 11:12 AM  Follow-up for Phone Call        Patient called back a second time to have note corrected. Out of work dates she is requesting is Dec. 8 - 12. Patient requesting a call back to confirm note completed and can pickup or fax to her work at [phone]. call back number is [phone] Follow-up by: Robin Ewing CMA Duncan Dull),  October 01, 2010 1:50 PM  Additional Follow-up for Phone Call Additional follow up Details #1::        ok    to go back to work dec 12  to [place] Additional Follow-up by: [name] MD,  October 01, 2010 4:26 PM    Additional Follow-up for Phone Call Additional follow up Details #2::    completed out of work note for pt. and called pt to inform ready for pickup. Follow-up by: Zella Ball Ewing CMA Duncan Dull),  October 01, 2010 4:35 PM

## 2010-11-24 NOTE — Letter (Signed)
Summary: Out of Work  [facility]  [address] [place], [place] [address]   Phone: [phone]  Fax: [phone]    January 02, 2010   Employee:  Nicole Melendez    To Whom It May Concern:   For Medical reasons, please excuse the above named employee from work for the following dates:  Start:   Jan 02, 2010 only              OK to return to work Jan 04, 2010 as planned without restriction  If you need additional information, please feel free to contact our office.         Sincerely,    [name] MD

## 2010-11-24 NOTE — Assessment & Plan Note (Signed)
Summary: 1 mops f/u #/cd   Vital Signs:  Patient profile:   35 year old female Height:      67 inches Weight:      302.50 pounds BMI:     47.55 O2 Sat:      98 % on Room air Temp:     98.4 degrees F oral Pulse rate:   76 / minute BP sitting:   110 / 70  (left arm) Cuff size:   large  Vitals Entered By: Zella Ball Ewing CMA Duncan Dull) (June 19, 2010 10:01 AM)  O2 Flow:  Room air CC: 1 month followup/RE   Primary Care Provider:  [name] MD  CC:  1 month followup/RE.  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 and much improved with current tx;  overall doing well except for onset difficulty with sleep primarily getting to sleep, then staying asleep more than a couple of hours at a time;  denies worsening depressive symtpoms, anxiety or panic;  no other med changes;  Pt denies CP, worsening sob, doe, wheezing, orthopnea, pnd, worsening LE edema, palps, dizziness or syncope Pt denies new neuro symptoms such as headache, facial or extremity weakness  Denies polydipsia or polyuria  Problems Prior to Update: 1)  Insomnia-sleep Disorder-unspec  (ICD-780.52) 2)  Pulmonary Sarcoidosis  (ICD-135) 3)  Dyspnea  (ICD-786.05) 4)  Mediastinal Lymphadenopathy  (ICD-785.6) 5)  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) 6)  Vaginitis, Candidal  (ICD-112.1) 7)  Diarrhea, Acute  (ICD-787.91) 8)  Menorrhagia  (ICD-626.2) 9)  Pneumonia  (ICD-486) 10)  Abdominal Pain, Lower  (ICD-789.09) 11)  Fever Unspecified  (ICD-780.60) 12)  Otitis Media, Acute, Bilateral  (ICD-382.9) 13)  Hyperlipidemia  (ICD-272.4) 14)  Glucose Intolerance  (ICD-271.3) 15)  Anemia-iron Deficiency  (ICD-280.9) 16)  Anxiety  (ICD-300.00) 17)  Smoker  (ICD-305.1) 18)  Wheezing  (ICD-786.07) 19)  Migraine Headache  (ICD-346.90) 20)  Gerd  (ICD-530.81) 21)  Peptic Ulcer Disease  (ICD-533.90) 22)  Glucose Intolerance  (ICD-271.3) 23)  Morbid Obesity   (ICD-278.01)  Medications Prior to Update: 1)  Omeprazole 20 Mg Cpdr (Omeprazole) .... 2 By Mouth Once Daily 2)  Meclizine Hcl 12.5 Mg Tabs (Meclizine Hcl) .Marland Kitchen.. 1po Q 6 Hrs As Needed Dizzy 3)  Alprazolam 0.5 Mg Tabs (Alprazolam) .Marland Kitchen.. 1 By Mouth Two Times A Day As Needed 4)  Oxycodone Hcl 5 Mg Tabs (Oxycodone Hcl) .Marland Kitchen.. 1po Q 6 Hrs As Needed Pain 5)  Fluoxetine Hcl 20 Mg Caps (Fluoxetine Hcl) .Marland Kitchen.. 1 By Mouth Once Daily 6)  Tessalon Perles 100 Mg Caps (Benzonatate) .Marland Kitchen.. 1-2 By Mouth Three Times A Day As Needed Cough 7)  Symbicort 160-4.5 Mcg/act Aero (Budesonide-Formoterol Fumarate) .... 2 Puffs Two Times A Day 8)  Proair Hfa 108 (90 Base) Mcg/act Aers (Albuterol Sulfate) .... 2 Puffs Four Times Per Day As Needed 9)  Fluconazole 150 Mg Tabs (Fluconazole) .Marland Kitchen.. 1po Every Other Day As Needed Yeast Infection 10)  Prednisone 10 Mg  Tabs (Prednisone) .... Take As Directed 11)  Prednisone 10 Mg  Tabs (Prednisone) .... 4 Each Day For Next 3 Weeks, Then Decrease To 3 Each Day Until Next Visit With Me.  Current Medications (verified): 1)  Omeprazole 20 Mg Cpdr (Omeprazole) .... 2 By Mouth Once Daily 2)  Meclizine Hcl 12.5 Mg Tabs (Meclizine Hcl) .Marland Kitchen.. 1po Q 6 Hrs As Needed Dizzy 3)  Alprazolam 0.5 Mg Tabs (Alprazolam) .Marland Kitchen.. 1 By Mouth Two Times A  Day As Needed 4)  Oxycodone Hcl 5 Mg Tabs (Oxycodone Hcl) .Marland Kitchen.. 1po Q 6 Hrs As Needed Pain 5)  Fluoxetine Hcl 20 Mg Caps (Fluoxetine Hcl) .Marland Kitchen.. 1 By Mouth Once Daily 6)  Tessalon Perles 100 Mg Caps (Benzonatate) .Marland Kitchen.. 1-2 By Mouth Three Times A Day As Needed Cough 7)  Symbicort 160-4.5 Mcg/act Aero (Budesonide-Formoterol Fumarate) .... 2 Puffs Two Times A Day 8)  Proair Hfa 108 (90 Base) Mcg/act Aers (Albuterol Sulfate) .... 2 Puffs Four Times Per Day As Needed 9)  Fluconazole 150 Mg Tabs (Fluconazole) .Marland Kitchen.. 1po Every Other Day As Needed Yeast Infection 10)  Prednisone 10 Mg  Tabs (Prednisone) .... Take As Directed 11)  Prednisone 10 Mg  Tabs (Prednisone) .... 4 Each  Day For Next 3 Weeks, Then Decrease To 3 Each Day Until Next Visit With Me. 12)  Zolpidem Tartrate 12.5 Mg Cr-Tabs (Zolpidem Tartrate) .Marland Kitchen.. 1 By Mouth At Bedtime As Needed  Allergies (verified): 1)  ! Pcn 2)  ! Percocet  Past History:  Past Medical History: Last updated: 05/15/2010 Morbid Obesity Glucose Intolerance Peptic ulcer disease GERD Migraine Anxiety Anemia-iron deficiency glucose intolerance Hyperlipidemia menorrhoagia PNA episode july 2011  GYN:  Dr Rosemary Holms  Past Surgical History: Last updated: 05/05/2010 s/p uterine ablation  April 21, 2010  Social History: Last updated: 05/21/2010 pt is single and lives with Dalbert Batman pt has no children. pt works as a Human resources officer. former smoker.  started at age 32.  less than 1 ppd.  quit July 2011. Alcohol use-yes Drug use-no  Risk Factors: Smoking Status: current (12/11/2009)  Review of Systems       all otherwise negative per pt -    Physical Exam  General:  alert and overweight-appearing.   Head:  normocephalic and atraumatic.   Eyes:  vision grossly intact, pupils equal, and pupils round.   Ears:  R ear normal and L ear normal.   Nose:  no external deformity and no nasal discharge.   Mouth:  no gingival abnormalities and pharynx pink and moist.   Neck:  supple and no masses.   Lungs:  normal respiratory effort and normal breath sounds.   Heart:  normal rate and regular rhythm.   Extremities:  no edema, no erythema  Neurologic:  cranial nerves II-XII intact and strength normal in all extremities.   Psych:  not depressed appearing and slightly anxious.     Impression & Recommendations:  Problem # 1:  INSOMNIA-SLEEP DISORDER-UNSPEC (ICD-780.52)  Her updated medication list for this problem includes:    Zolpidem Tartrate 12.5 Mg Cr-tabs (Zolpidem tartrate) .Marland Kitchen... 1 by mouth at bedtime as needed treat as above, f/u any worsening signs or symptoms   Problem # 2:  GLUCOSE INTOLERANCE  (ICD-271.3) asympt, ok to follow for now, but to report any polydipsia, polyuria, or cbg > 150-200 on steroid  Problem # 3:  DYSPNEA (ICD-786.05) marked improved with current tx for sarcoid; to f/u pulm as recommended  Complete Medication List: 1)  Omeprazole 20 Mg Cpdr (Omeprazole) .... 2 by mouth once daily 2)  Meclizine Hcl 12.5 Mg Tabs (Meclizine hcl) .Marland Kitchen.. 1po q 6 hrs as needed dizzy 3)  Alprazolam 0.5 Mg Tabs (Alprazolam) .Marland Kitchen.. 1 by mouth two times a day as needed 4)  Oxycodone Hcl 5 Mg Tabs (Oxycodone hcl) .Marland Kitchen.. 1po q 6 hrs as needed pain 5)  Fluoxetine Hcl 20 Mg Caps (Fluoxetine hcl) .Marland Kitchen.. 1 by mouth once daily 6)  Tessalon Perles 100 Mg Caps (  Benzonatate) .Marland Kitchen.. 1-2 by mouth three times a day as needed cough 7)  Symbicort 160-4.5 Mcg/act Aero (Budesonide-formoterol fumarate) .... 2 puffs two times a day 8)  Proair Hfa 108 (90 Base) Mcg/act Aers (Albuterol sulfate) .... 2 puffs four times per day as needed 9)  Fluconazole 150 Mg Tabs (Fluconazole) .Marland Kitchen.. 1po every other day as needed yeast infection 10)  Prednisone 10 Mg Tabs (Prednisone) .... Take as directed 11)  Prednisone 10 Mg Tabs (Prednisone) .... 4 each day for next 3 weeks, then decrease to 3 each day until next visit with me. 12)  Zolpidem Tartrate 12.5 Mg Cr-tabs (Zolpidem tartrate) .Marland Kitchen.. 1 by mouth at bedtime as needed  Patient Instructions: 1)  Please take all new medications as prescribed 2)  Continue all previous medications as before this visit 3)  Please schedule a follow-up appointment in 6 months or sooner if needed Prescriptions: ZOLPIDEM TARTRATE 12.5 MG CR-TABS (ZOLPIDEM TARTRATE) 1 by mouth at bedtime as needed  #30 x 5   Entered and Authorized by:   [name] MD   Signed by:   [name] MD on 06/19/2010   Method used:   Print then Give to Patient   RxID:   [id number]

## 2010-11-24 NOTE — Assessment & Plan Note (Signed)
Summary: diarrhea/cold/last ov 2007/coming for acute visit only/cd   Vital Signs:  Patient profile:   35 year old female Height:      67.5 inches Weight:      313 pounds BMI:     48.47 O2 Sat:      98 % on Room air Temp:     98.5 degrees F oral Pulse rate:   89 / minute BP sitting:   118 / 70  (left arm) Cuff size:   large  Vitals Entered ByZella Ball Ewing (December 11, 2009 11:16 AM)  O2 Flow:  Room air CC: Diarrhea, body aches, congestion, chest pain/RE   CC:  Diarrhea, body aches, congestion, and chest pain/RE.  History of Present Illness: here with acute onset 3 days flu like illness with low grade fever, headache, general weakness and malaise; mild ST, myalgias, mild right pleuritic chest pains, non prod cough but chest congestion clearish sputum;  and diarrhea watery stool without blood or orthostasis, dizziness, palp or syncope.  Does have some mild difficulty with taking a deep breath with mild wheezing at best but denies sob/doe, orthopnea, pnd or worsening LE edema.  Denies rash, joint swelling, chills, abd pains.  Also requests rx for chantix as she is now determined to quit smoking.  Declines flu shot today.  Denies polydipsia or polyuria, blurred vision or recent  known elevated blood sugars.    Preventive Screening-Counseling & Management  Alcohol-Tobacco     Smoking Status: current      Drug Use:  no.    Problems Prior to Update: 1)  Smoker  (ICD-305.1) 2)  Wheezing  (ICD-786.07) 3)  Viral Infection-unspec  (ICD-079.99) 4)  Migraine Headache  (ICD-346.90) 5)  Gerd  (ICD-530.81) 6)  Peptic Ulcer Disease  (ICD-533.90) 7)  Glucose Intolerance  (ICD-271.3) 8)  Morbid Obesity  (ICD-278.01)  Medications Prior to Update: 1)  None  Current Medications (verified): 1)  Promethazine Hcl 25 Mg Tabs (Promethazine Hcl) .Marland Kitchen.. 1 By Mouth Q 6 Hrs As Needed Nausea 2)  Lomotil 2.5-0.025 Mg Tabs (Diphenoxylate-Atropine) .Marland Kitchen.. 1 By Mouth As Needed Loose Stool (Max 8 Tabs Per  24hrs) 3)  Prednisone 10 Mg Tabs (Prednisone) .... 3po Qd For 3days, Then 2po Qd For 3days, Then 1po Qd For 3days, Then Stop 4)  Chantix Continuing Month Pak 1 Mg Tabs (Varenicline Tartrate) .... Use Asd 1 By Mouth Once Daily  Allergies (verified): 1)  ! Pcn  Past History:  Family History: Last updated: 12/11/2009 DM HTN  Social History: Last updated: 12/11/2009 Married Current Smoker Alcohol use-yes Drug use-no  Risk Factors: Smoking Status: current (12/11/2009)  Past Medical History: Morbid Obesity Glucose Intolerance Peptic ulcer disease GERD Migraine Anxiety Anemia-iron deficiency glucose intolerance Hyperlipidemia  Past Surgical History: Denies surgical history  Family History: Reviewed history and no changes required. DM HTN  Social History: Reviewed history and no changes required. Married Current Smoker Alcohol use-yes Drug use-no Smoking Status:  current Drug Use:  no  Review of Systems       all otherwise negative per pt   Physical Exam  General:  alert and overweight-appearing.   Head:  normocephalic and atraumatic.   Eyes:  vision grossly intact, pupils equal, and pupils round.   Ears:  bilat tm's red, sinus nontender Nose:  nasal dischargemucosal pallor and mucosal edema.   Mouth:  pharyngeal erythema and fair dentition.   Neck:  supple and no masses.   Lungs:  normal respiratory effort, R decreased breath sounds,  R wheezes, L decreased breath sounds, and L wheezes.   Heart:  normal rate and regular rhythm.   Abdomen:  soft, non-tender, and normal bowel sounds.   Msk:  no joint tenderness and no joint swelling.   Extremities:  no edema, no erythema  Skin:  color normal and no rashes.     Impression & Recommendations:  Problem # 1:  VIRAL INFECTION-UNSPEC (ICD-079.99) clinical exam c/w this;  declines tamiflu,  tx with rest, fluids , tylenol as needed   Problem # 2:  WHEEZING (ICD-786.07) for prednisone burst and taper off    Problem # 3:  SMOKER (ICD-305.1)  Her updated medication list for this problem includes:    Chantix Continuing Month Pak 1 Mg Tabs (Varenicline tartrate) ..... Use asd 1 by mouth once daily treat as above, f/u any worsening signs or symptoms   Problem # 4:  GLUCOSE INTOLERANCE (ICD-271.3) stable overall by hx and exam, ok to continue meds/tx as is , no meds needed at this time, pt to f/u any worsening polys on prednisone or blurred vision, declines labs today  Complete Medication List: 1)  Promethazine Hcl 25 Mg Tabs (Promethazine hcl) .Marland Kitchen.. 1 by mouth q 6 hrs as needed nausea 2)  Lomotil 2.5-0.025 Mg Tabs (Diphenoxylate-atropine) .Marland Kitchen.. 1 by mouth as needed loose stool (max 8 tabs per 24hrs) 3)  Prednisone 10 Mg Tabs (Prednisone) .... 3po qd for 3days, then 2po qd for 3days, then 1po qd for 3days, then stop 4)  Chantix Continuing Month Pak 1 Mg Tabs (Varenicline tartrate) .... Use asd 1 by mouth once daily  Patient Instructions: 1)  Please take all new medications as prescribed 2)  Continue all previous medications as before this visit  3)  You are given the note for work today 4)  Please call for any owrsening symptoms such as blurred vision, or increase thirst or urination 5)  Please schedule a follow-up appointment as needed. Prescriptions: CHANTIX CONTINUING MONTH PAK 1 MG TABS (VARENICLINE TARTRATE) use asd 1 by mouth once daily  #30 x 1   Entered and Authorized by:   [name] MD   Signed by:   [name] MD on 12/11/2009   Method used:   Print then Give to Patient   RxID:   [id number] CHANTIX STARTING MONTH PAK 0.5 MG X 11 & 1 MG X 42 TABS (VARENICLINE TARTRATE) use asd 1 by mouth once daily  #30 x 0   Entered and Authorized by:   [name] MD   Signed by:   [name] MD on 12/11/2009   Method used:   Print then Give to Patient   RxID:   [id number] PREDNISONE 10 MG TABS (PREDNISONE) 3po qd for 3days, then 2po qd for 3days, then 1po qd for 3days, then  stop  #18 x 0   Entered and Authorized by:   [name] MD   Signed by:   [name] MD on 12/11/2009   Method used:   Print then Give to Patient   RxID:   [id number] LOMOTIL 2.5-0.025 MG TABS (DIPHENOXYLATE-ATROPINE) 1 by mouth as needed loose stool (max 8 tabs per 24hrs)  #40 x 1   Entered and Authorized by:   [name] MD   Signed by:   [name] MD on 12/11/2009   Method used:   Print then Give to Patient   RxID:   [id number] PROMETHAZINE HCL 25 MG TABS (PROMETHAZINE HCL)  1 by mouth q 6 hrs as needed nausea  #40 x 1   Entered and Authorized by:   [name] MD   Signed by:   [name] MD on 12/11/2009   Method used:   Print then Give to Patient   RxID:   [id number]

## 2010-11-24 NOTE — Assessment & Plan Note (Signed)
Summary: COUGH/NWS   Vital Signs:  Patient profile:   35 year old female Height:      67 inches Weight:      308.25 pounds BMI:     48.45 O2 Sat:      97 % on Room air Temp:     100.3 degrees F oral Pulse rate:   94 / minute BP sitting:   100 / 60  (left arm) Cuff size:   large  Vitals Entered ByZella Ball Ewing (Mar 19, 2010 11:48 AM)  O2 Flow:  Room air CC: Cough/RE   CC:  Cough/RE.  History of Present Illness: here with acute onset mild to mod x 2 to 3 days onset fever, mild ST, and increaseinlyg prod ocugh with greenish sputum, and today with mild wheezing and sob/doe (but not activity limiting).  Pt denies  orthopnea, pnd, worsening LE edema, palps, dizziness or syncope  Pt denies new neuro symptoms such as headache, facial or extremity weakness , but does have marked increased anxiety recently with near panic, though she has not had to go to ER.  Worse to go to work, one xanax per day in the am prior to work seems to work well in the past.  Denies worsening depressive symptoms, or suicidal ideation.    Problems Prior to Update: 1)  Bronchitis-acute  (ICD-466.0) 2)  Otitis Media, Acute, Bilateral  (ICD-382.9) 3)  Hyperlipidemia  (ICD-272.4) 4)  Glucose Intolerance  (ICD-271.3) 5)  Anemia-iron Deficiency  (ICD-280.9) 6)  Anxiety  (ICD-300.00) 7)  Smoker  (ICD-305.1) 8)  Wheezing  (ICD-786.07) 9)  Migraine Headache  (ICD-346.90) 10)  Gerd  (ICD-530.81) 11)  Peptic Ulcer Disease  (ICD-533.90) 12)  Glucose Intolerance  (ICD-271.3) 13)  Morbid Obesity  (ICD-278.01)  Medications Prior to Update: 1)  Prednisone 10 Mg Tabs (Prednisone) .... 3po Qd For 3days, Then 2po Qd For 3days, Then 1po Qd For 3days, Then Stop 2)  Chantix Continuing Month Pak 1 Mg Tabs (Varenicline Tartrate) .... Use Asd 1 By Mouth Once Daily 3)  Doxycycline Hyclate 100 Mg Caps (Doxycycline Hyclate) .Marland Kitchen.. 1po Two Times A Day 4)  Hydrocodone-Homatropine 5-1.5 Mg/79ml Syrp (Hydrocodone-Homatropine) .Marland Kitchen.. 1 Tsp By  Mouth Q 6 Hrs As Needed Cough 5)  Omeprazole 20 Mg Cpdr (Omeprazole) .... 2 By Mouth Once Daily 6)  Meclizine Hcl 12.5 Mg Tabs (Meclizine Hcl) .Marland Kitchen.. 1po Q 6 Hrs As Needed Dizzy  Current Medications (verified): 1)  Prednisone 10 Mg Tabs (Prednisone) .... 3po Qd For 3days, Then 2po Qd For 3days, Then 1po Qd For 3days, Then Stop 2)  Chantix Continuing Month Pak 1 Mg Tabs (Varenicline Tartrate) .... Use Asd 1 By Mouth Once Daily 3)  Azithromycin 250 Mg Tabs (Azithromycin) .... 2po Qd For 1 Day, Then 1po Qd For 4days, Then Stop 4)  Hydrocodone-Homatropine 5-1.5 Mg/76ml Syrp (Hydrocodone-Homatropine) .Marland Kitchen.. 1 Tsp By Mouth Q 6 Hrs As Needed Cough 5)  Omeprazole 20 Mg Cpdr (Omeprazole) .... 2 By Mouth Once Daily 6)  Meclizine Hcl 12.5 Mg Tabs (Meclizine Hcl) .Marland Kitchen.. 1po Q 6 Hrs As Needed Dizzy 7)  Alprazolam 0.25 Mg Tabs (Alprazolam) .... 1/2 - 1 By Mouth Once Daily As Needed  Allergies (verified): 1)  ! Pcn  Past History:  Past Medical History: Last updated: 12/11/2009 Morbid Obesity Glucose Intolerance Peptic ulcer disease GERD Migraine Anxiety Anemia-iron deficiency glucose intolerance Hyperlipidemia  Past Surgical History: Last updated: 12/11/2009 Denies surgical history  Social History: Last updated: 12/11/2009 Married Current Smoker Alcohol use-yes Drug  use-no  Risk Factors: Smoking Status: current (12/11/2009)  Review of Systems       all otherwise negative per pt -    Physical Exam  General:  alert and overweight-appearing.  , mild ill  Head:  normocephalic and atraumatic.   Eyes:  vision grossly intact, pupils equal, and pupils round.   Ears:  bilat tm's mil dred, sinus nontender Nose:  nasal dischargemucosal pallor and mucosal edema.   Mouth:  pharyngeal erythema and fair dentition.   Neck:  supple and cervical lymphadenopathy.   Lungs:  normal respiratory effort, R decreased breath sounds, R wheezes, L decreased breath sounds, and L wheezes.   - mild Heart:   normal rate and regular rhythm.   Extremities:  no edema, no erythema  Psych:  not depressed appearing and moderately anxious.     Impression & Recommendations:  Problem # 1:  BRONCHITIS-ACUTE (ICD-466.0)  Her updated medication list for this problem includes:    Azithromycin 250 Mg Tabs (Azithromycin) .Marland Kitchen... 2po qd for 1 day, then 1po qd for 4days, then stop    Hydrocodone-homatropine 5-1.5 Mg/81ml Syrp (Hydrocodone-homatropine) .Marland Kitchen... 1 tsp by mouth q 6 hrs as needed cough  Orders: Depo- Medrol 40mg  (J1030) Depo- Medrol 80mg  (J1040) Admin of Therapeutic Inj  intramuscular or subcutaneous (45409) treat as above, f/u any worsening signs or symptoms mo  Problem # 2:  WHEEZING (ICD-786.07) st likely related to above, tx with depo shot, and pred pack   Problem # 3:  .stable ANXIETY (ICD-300.00)  Her updated medication list for this problem includes:    Alprazolam 0.25 Mg Tabs (Alprazolam) .Marland Kitchen... 1/2 - 1 by mouth once daily as needed , stable overall by hx and exam, ok to continue meds/tx as is   Complete Medication List: 1)  Prednisone 10 Mg Tabs (Prednisone) .... 3po qd for 3days, then 2po qd for 3days, then 1po qd for 3days, then stop 2)  Chantix Continuing Month Pak 1 Mg Tabs (Varenicline tartrate) .... Use asd 1 by mouth once daily 3)  Azithromycin 250 Mg Tabs (Azithromycin) .... 2po qd for 1 day, then 1po qd for 4days, then stop 4)  Hydrocodone-homatropine 5-1.5 Mg/33ml Syrp (Hydrocodone-homatropine) .Marland Kitchen.. 1 tsp by mouth q 6 hrs as needed cough 5)  Omeprazole 20 Mg Cpdr (Omeprazole) .... 2 by mouth once daily 6)  Meclizine Hcl 12.5 Mg Tabs (Meclizine hcl) .Marland Kitchen.. 1po q 6 hrs as needed dizzy 7)  Alprazolam 0.25 Mg Tabs (Alprazolam) .... 1/2 - 1 by mouth once daily as needed  Patient Instructions: 1)  you had the steroid shot today 2)  Please take all new medications as prescribed  3)  Continue all previous medications as before this visit  4)  Please schedule a follow-up appointment  as needed. Prescriptions: ALPRAZOLAM 0.25 MG TABS (ALPRAZOLAM) 1/2 - 1 by mouth once daily as needed  #30 x 3   Entered and Authorized by:   [name] MD   Signed by:   [name] MD on 03/19/2010   Method used:   Print then Give to Patient   RxID:   [id number] PREDNISONE 10 MG TABS (PREDNISONE) 3po qd for 3days, then 2po qd for 3days, then 1po qd for 3days, then stop  #18 x 0   Entered and Authorized by:   [name] MD   Signed by:   [name] MD on 03/19/2010   Method used:   Print then Give to Patient   RxID:  [id number] HYDROCODONE-HOMATROPINE 5-1.5 MG/5ML SYRP (HYDROCODONE-HOMATROPINE) 1 tsp by mouth q 6 hrs as needed cough  #6oz x 1   Entered and Authorized by:   [name] MD   Signed by:   [name] MD on 03/19/2010   Method used:   Print then Give to Patient   RxID:   [id number] AZITHROMYCIN 250 MG TABS (AZITHROMYCIN) 2po qd for 1 day, then 1po qd for 4days, then stop  #6 x 1   Entered and Authorized by:   [name] MD   Signed by:   [name] MD on 03/19/2010   Method used:   Print then Give to Patient   RxID:   [id number]    Medication Administration  Injection # 1:    Medication: Depo- Medrol 40mg     Diagnosis: BRONCHITIS-ACUTE (ICD-466.0)    Route: IV    Site: LUOQ gluteus    Exp Date: 08/2012    Lot #: [id number]    Mfr: [employer]    Given by: Zella Ball Ewing (Mar 19, 2010 1:40 PM)  Injection # 2:    Medication: Depo- Medrol 80mg     Diagnosis: BRONCHITIS-ACUTE (ICD-466.0)    Route: IM    Site: LUOQ gluteus    Exp Date: 08/2012    Lot #: [id number]    Mfr: [employer]    Given by: Zella Ball Ewing (Mar 19, 2010 1:41 PM)  Orders Added: 1)  Depo- Medrol 40mg  [J1030] 2)  Depo- Medrol 80mg  [J1040] 3)  Admin of Therapeutic Inj  intramuscular or subcutaneous [96372] 4)  Est. Patient Level IV [06237]

## 2010-11-24 NOTE — Letter (Signed)
Summary: Out of Work  [facility]  [address] [place], [place] [address]   Phone: [phone]  Fax: [phone]    July 31, 2010   Employee:  Nicole Melendez    To Whom It May Concern:   For Medical reasons, please excuse the above named employee from work for the following dates:  Start:   07/31/2010  End:     08/01/2010  Patient can return to work Sunday August 02, 2010  If you need additional information, please feel free to contact our office.         Sincerely,    Dr. [name]

## 2010-11-24 NOTE — Assessment & Plan Note (Signed)
Summary: acute sick visit for acute bronchitis.   Visit Type:  Follow-up Copy to:  [name] Primary Provider/Referring Provider:  [name] MD  CC:  2 month f/u. pt c/o cough, wheezing, chest tightness when coughing, fever, chills, and body aches. x 1 week.  pt states she is getting strangled. pt quit smoking 04/2010.  History of Present Illness: the pt comes in today for an acute sick visit.  She has known sarcoid, but had very poor tolerance to prednisone with significant complications.  She has been weaned off the medication, and the decision was made to follow her clinically.  She comes in with a one week h/o cough with discolored mucus, chest congestion, worsening sob, and body aches.  She also feels that she has had fever and chills.  Current Medications (verified): 1)  Omeprazole 20 Mg Cpdr (Omeprazole) .... 2 By Mouth Once Daily 2)  Meclizine Hcl 12.5 Mg Tabs (Meclizine Hcl) .Marland Kitchen.. 1po Q 6 Hrs As Needed Dizzy 3)  Alprazolam 0.5 Mg Tabs (Alprazolam) .Marland Kitchen.. 1 By Mouth Two Times A Day As Needed 4)  Fluoxetine Hcl 20 Mg Caps (Fluoxetine Hcl) .Marland Kitchen.. 1 By Mouth Once Daily 5)  Symbicort 160-4.5 Mcg/act Aero (Budesonide-Formoterol Fumarate) .... 2 Puffs Two Times A Day 6)  Proair Hfa 108 (90 Base) Mcg/act Aers (Albuterol Sulfate) .... 2 Puffs Four Times Per Day As Needed 7)  Zolpidem Tartrate 12.5 Mg Cr-Tabs (Zolpidem Tartrate) .Marland Kitchen.. 1 By Mouth At Bedtime As Needed 8)  Onetouch Test  Strp (Glucose Blood) .... Use Asd Four Times Per Day 9)  Lancets  Misc (Lancets) .... Use Asd Four Times Per Day 10)  Janumet 50-500 Mg Tabs (Sitagliptin-Metformin Hcl) .Marland Kitchen.. 1poi Two Times A Day 11)  Ferrous Sulfate 325 (65 Fe) Mg Tbec (Ferrous Sulfate) .Marland Kitchen.. 1po Two Times A Day 12)  Furosemide 20 Mg Tabs (Furosemide) .... Take 1 Tab By Mouth Every Day  Allergies (verified): 1)  ! Pcn 2)  ! Percocet  Past History:  Past medical, surgical, family and social histories (including risk factors) reviewed, and no  changes noted (except as noted below).  Past Medical History: Reviewed history from 07/31/2010 and no changes required. Morbid Obesity Glucose Intolerance Peptic ulcer disease GERD Migraine Anxiety Anemia-iron deficiency glucose intolerance Hyperlipidemia menorrhoagia PNA episode july 2011 GYN:  Dr Rosemary Holms Diabetes mellitus, type II - steroid related  Past Surgical History: Reviewed history from 05/05/2010 and no changes required. s/p uterine ablation  April 21, 2010  Family History: Reviewed history from 05/21/2010 and no changes required. DM HTN  allergies: mother heart disease: mother (m.i.) , father cancer: maternal aunts ( ovarian, lung, breast, bone)   Social History: Reviewed history from 07/31/2010 and no changes required. pt is single and lives with Dalbert Batman - also pt of [facility] pt has no children. pt works as a Human resources officer. former smoker.  started at age 43.  less than 1 ppd.  quit July 2011. Alcohol use-yes Drug use-no  Review of Systems       The patient complains of shortness of breath with activity, non-productive cough, nasal congestion/difficulty breathing through nose, and hand/feet swelling.  The patient denies shortness of breath at rest, productive cough, coughing up blood, chest pain, irregular heartbeats, acid heartburn, indigestion, loss of appetite, weight change, abdominal pain, difficulty swallowing, sore throat, tooth/dental problems, headaches, sneezing, itching, ear ache, anxiety, depression, joint stiffness or pain, rash, change in color of mucus, and fever.    Vital Signs:  Patient  profile:   35 year old female Height:      67 inches Weight:      304 pounds BMI:     47.79 O2 Sat:      98 % on Room air Temp:     98.2 degrees F oral Pulse rate:   91 / minute BP sitting:   110 / 56  (left arm) Cuff size:   large  Vitals Entered By: Carver Fila (September 10, 2010 3:31 PM)  O2 Flow:  Room air CC: 2 month f/u. pt c/o cough, wheezing,  chest tightness when coughing, fever, chills, body aches. x 1 week.  pt states she is getting strangled. pt quit smoking 04/2010 Comments meds and allergies updated Phone number updated Carver Fila  September 10, 2010 3:29 PM    Physical Exam  General:  obese female in nad Nose:  no purulence or drainage Lungs:  a few rhonchi, but no crackles or wheezing Heart:  rrr Extremities:  no significant edema or cyanosis Neurologic:  alert and oriented, moves all 4.    Impression & Recommendations:  Problem # 1:  BRONCHITIS, ACUTE (ICD-466.0) the pt's history is most suggestive of acute bronchitis.  She will need treatment with abx, but would like to hold off on steroids given her lack of bronchospasm on exam and rxn in the past.  Problem # 2:  PULMONARY SARCOIDOSIS (ICD-135) I suspect the pt's current symptoms have nothing to do with her sarcoid.  Will need to monitor her over time with pfts and cxr's, as well as her clinical progress.  I have also stressed to her the importance of weight loss.  Medications Added to Medication List This Visit: 1)  Ferrous Sulfate 325 (65 Fe) Mg Tbec (Ferrous sulfate) .Marland Kitchen.. 1po two times a day 2)  Avelox 400 Mg Tabs (Moxifloxacin hcl) .... By mouth daily  Other Orders: Est. Patient Level IV (04540) T- * Misc. Laboratory test [phone])  Patient Instructions: 1)  will treat with avelox 400mg  one each day for 5 days 2)  mucinex dm twice a day may help with cough 3)  please call me if not improved in next 3-4 days.  Prescriptions: AVELOX 400 MG  TABS (MOXIFLOXACIN HCL) By mouth daily  #3 x 0   Entered and Authorized by:   Barbaraann Share MD   Signed by:   Barbaraann Share MD on 09/10/2010   Method used:   Print then Give to Patient   RxID:   [id number]   Appended Document: acute sick visit for acute bronchitis. please let pt know we forgot to schedule f/u apptm (check IDX to see if this is the case)  would like to see her in 3 mos if doing well, sooner  if having issues.  Appended Document: acute sick visit for acute bronchitis. pt scehduled for 11/27/2010 at 9:15

## 2010-11-24 NOTE — Letter (Signed)
Summary: Generic Letter  [name]  [address] [place], [place] [address]   Phone: [phone]  Fax: [phone]    09/29/2010  [facility] [place] [address] [place], [place]  [address]  Dear [name] and whom it may concern,  Due to chronic medical conditions of diabetes and sarcoid with frequent flares of each, patient will intermittently be absent from work. Please contact her PCP Dr. [name] for further information as needed.      Sincerely,     Rene Paci MD

## 2010-11-24 NOTE — Assessment & Plan Note (Signed)
Summary: WEAK/ACHES SINCE IRON TRANSFUSION WED/NWS   Vital Signs:  Patient profile:   35 year old female Weight:      300 pounds O2 Sat:      99 % on Room air Temp:     98.7 degrees F oral Pulse rate:   92 / minute BP sitting:   108 / 70  (left arm)  Vitals Entered By: Doristine Devoid CMA (August 15, 2010 9:18 AM)  O2 Flow:  Room air CC: body aches and cough    History of Present Illness: 35 yo woman here today for body aches since iron transfusion on Wed.  this is 2nd transfusion, no problems after 1st.  taking tylenol bid w/ some relief.  denies fevers or chills.  has developed cough- intermittantly productive.  hx of pulmonary sarcoid- 'they give me abx when i get a cold to avoid flaring up the sarcoid'.  denies nasal congestion or ear pain.  does have HA.  + facial pressure.  also c/o LE edema.  reports 'they puff up real good at work'.  recently started on HCTZ- 'this don't work'.  legs will improve w/ elevation and massage.  is on her feet for her entire work shift and reports that they get tight and painful w/ increased edema.  Current Medications (verified): 1)  Omeprazole 20 Mg Cpdr (Omeprazole) .... 2 By Mouth Once Daily 2)  Meclizine Hcl 12.5 Mg Tabs (Meclizine Hcl) .Marland Kitchen.. 1po Q 6 Hrs As Needed Dizzy 3)  Alprazolam 0.5 Mg Tabs (Alprazolam) .Marland Kitchen.. 1 By Mouth Two Times A Day As Needed 4)  Fluoxetine Hcl 20 Mg Caps (Fluoxetine Hcl) .Marland Kitchen.. 1 By Mouth Once Daily 5)  Tessalon Perles 100 Mg Caps (Benzonatate) .Marland Kitchen.. 1-2 By Mouth Three Times A Day As Needed Cough 6)  Symbicort 160-4.5 Mcg/act Aero (Budesonide-Formoterol Fumarate) .... 2 Puffs Two Times A Day 7)  Proair Hfa 108 (90 Base) Mcg/act Aers (Albuterol Sulfate) .... 2 Puffs Four Times Per Day As Needed 8)  Fluconazole 150 Mg Tabs (Fluconazole) .Marland Kitchen.. 1po Every Other Day As Needed Yeast Infection 9)  Zolpidem Tartrate 12.5 Mg Cr-Tabs (Zolpidem Tartrate) .Marland Kitchen.. 1 By Mouth At Bedtime As Needed 10)  Fioricet 50-325-40 Mg Tabs  (Butalbital-Apap-Caffeine) .Marland Kitchen.. 1po Once Daily As Needed 11)  Onetouch Test  Strp (Glucose Blood) .... Use Asd Four Times Per Day 12)  Lancets  Misc (Lancets) .... Use Asd Four Times Per Day 13)  Janumet 50-500 Mg Tabs (Sitagliptin-Metformin Hcl) .Marland Kitchen.. 1poi Two Times A Day 14)  Ferrous Sulfate 325 (65 Fe) Mg Tbec (Ferrous Sulfate) .Marland Kitchen.. 1po Once Daily 15)  Venofer 20 Mg/ml Soln (Iron Sucrose) .... 5 Cc Iv Q Wk For 3 Wks 16)  Furosemide 20 Mg Tabs (Furosemide) .... Take 1 Tab By Mouth Every Day 17)  Doxycycline Hyclate 100 Mg Caps (Doxycycline Hyclate) .... Take 1 Tab Twice A Day- Take W/ Food 18)  Diflucan 150 Mg Tabs (Fluconazole) .... Once Daily If Yeast Sxs Develop.  May Repeat in 3 Days If Sxs Persist.  Allergies (verified): 1)  ! Pcn 2)  ! Percocet  Past History:  Past Medical History: Last updated: 07/31/2010 Morbid Obesity Glucose Intolerance Peptic ulcer disease GERD Migraine Anxiety Anemia-iron deficiency glucose intolerance Hyperlipidemia menorrhoagia PNA episode july 2011 GYN:  Dr Rosemary Holms Diabetes mellitus, type II - steroid related  Review of Systems      See HPI  Physical Exam  General:  alert and overweight-appearing.  NAD Head:  + TTP over frontal and  maxillary sinuses Eyes:  no injxn or inflammation Ears:  External ear exam shows no significant lesions or deformities.  Otoscopic examination reveals clear canals, tympanic membranes are intact bilaterally without bulging, retraction, inflammation or discharge. Hearing is grossly normal bilaterally. Nose:  mild congestion Mouth:  no pharyngeal erythema or exudate Neck:  supple and no masses.   Lungs:  normal respiratory effort and normal breath sounds.   no cough heard during visit  Heart:  normal rate and regular rhythm.   Pulses:  +2 DP Extremities:  trace to 1+ edema bilat   Impression & Recommendations:  Problem # 1:  SINUSITIS- ACUTE-NOS (ICD-461.9) Assessment Unchanged given PCN allergy and recent  Azithro use will start Doxy.  may be cause of pt's myalgias/malaise.  reviewed supportive care and red flags that should prompt return.  Pt expresses understanding and is in agreement w/ this plan. The following medications were removed from the medication list:    Azithromycin 250 Mg Tabs (Azithromycin) .Marland Kitchen... 2po qd for 1 day, then 1po qd for 4days, then stop Her updated medication list for this problem includes:    Tessalon Perles 100 Mg Caps (Benzonatate) .Marland Kitchen... 1-2 by mouth three times a day as needed cough    Doxycycline Hyclate 100 Mg Caps (Doxycycline hyclate) .Marland Kitchen... Take 1 tab twice a day- take w/ food  Problem # 2:  PERIPHERAL EDEMA (ICD-782.3) Assessment: Unchanged pt reports no improvement in sxs w/ HCTZ.  will stop HCTZ and start Lasix.  stressed importance of f/u w/ PCP to check K+.  pt aware and in agreement. The following medications were removed from the medication list:    Hydrochlorothiazide 12.5 Mg Caps (Hydrochlorothiazide) .Marland Kitchen... 1po once daily Her updated medication list for this problem includes:    Furosemide 20 Mg Tabs (Furosemide) .Marland Kitchen... Take 1 tab by mouth every day  Problem # 3:  MALAISE (ICD-780.79) Assessment: New pt's body aches could be side effect of iron transfusion as this occurs in >10% of pts.  may also be related to sinus infxn.  tylenol q6 for pain, tx sinusitis as discussed above.  Complete Medication List: 1)  Omeprazole 20 Mg Cpdr (Omeprazole) .... 2 by mouth once daily 2)  Meclizine Hcl 12.5 Mg Tabs (Meclizine hcl) .Marland Kitchen.. 1po q 6 hrs as needed dizzy 3)  Alprazolam 0.5 Mg Tabs (Alprazolam) .Marland Kitchen.. 1 by mouth two times a day as needed 4)  Fluoxetine Hcl 20 Mg Caps (Fluoxetine hcl) .Marland Kitchen.. 1 by mouth once daily 5)  Tessalon Perles 100 Mg Caps (Benzonatate) .Marland Kitchen.. 1-2 by mouth three times a day as needed cough 6)  Symbicort 160-4.5 Mcg/act Aero (Budesonide-formoterol fumarate) .... 2 puffs two times a day 7)  Proair Hfa 108 (90 Base) Mcg/act Aers (Albuterol sulfate)  .... 2 puffs four times per day as needed 8)  Fluconazole 150 Mg Tabs (Fluconazole) .Marland Kitchen.. 1po every other day as needed yeast infection 9)  Zolpidem Tartrate 12.5 Mg Cr-tabs (Zolpidem tartrate) .Marland Kitchen.. 1 by mouth at bedtime as needed 10)  Fioricet 50-325-40 Mg Tabs (Butalbital-apap-caffeine) .Marland Kitchen.. 1po once daily as needed 11)  Onetouch Test Strp (Glucose blood) .... Use asd four times per day 12)  Lancets Misc (Lancets) .... Use asd four times per day 13)  Janumet 50-500 Mg Tabs (Sitagliptin-metformin hcl) .Marland Kitchen.. 1poi two times a day 14)  Ferrous Sulfate 325 (65 Fe) Mg Tbec (Ferrous sulfate) .Marland Kitchen.. 1po once daily 15)  Venofer 20 Mg/ml Soln (Iron sucrose) .... 5 cc iv q wk for 3 wks 16)  Furosemide 20 Mg Tabs (Furosemide) .... Take 1 tab by mouth every day 17)  Doxycycline Hyclate 100 Mg Caps (Doxycycline hyclate) .... Take 1 tab twice a day- take w/ food 18)  Diflucan 150 Mg Tabs (Fluconazole) .... Once daily if yeast sxs develop.  may repeat in 3 days if sxs persist.  Patient Instructions: 1)  Follow up with Dr Jonny Ruiz early this week 2)  Start the Doxycycline for a sinus infxn- take w/ food 3)  STOP the hydrochlorothiazide 4)  Start the furosemide daily for the swelling 5)  Take the Diflucan if you develop yeast 6)  Take tylenol for the body aches every 6 hours 7)  Call if no improvement or concerns 8)  Hang in there! Prescriptions: DIFLUCAN 150 MG TABS (FLUCONAZOLE) once daily if yeast sxs develop.  may repeat in 3 days if sxs persist.  #2 x 0   Entered and Authorized by:   Neena Rhymes MD   Signed by:   Neena Rhymes MD on 08/15/2010   Method used:   Electronically to        [facility] [address] [phone]* (retail)       [address] [place], [place]  [address]       Ph: [phone]       Fax: [phone]   RxID:   [id number] DOXYCYCLINE HYCLATE 100 MG CAPS (DOXYCYCLINE HYCLATE) Take 1 tab twice a day- take w/ food  #20 x 0   Entered and Authorized by:   Neena Rhymes MD   Signed by:   Neena Rhymes MD on 08/15/2010   Method used:   Electronically to        [facility] [address] [phone]* (retail)       [address] [place], [place]  [address]       Ph: [phone]       Fax: [phone]   RxID:   [id number] FUROSEMIDE 20 MG TABS (FUROSEMIDE) Take 1 tab by mouth every day  #30 x 1   Entered and Authorized by:   Neena Rhymes MD   Signed by:   Neena Rhymes MD on 08/15/2010   Method used:   Electronically to        [facility] [address] [phone]* (retail)       [address] [place], [place]  [address]       Ph: [phone]       Fax: [phone]   RxID:   [id number]    Orders Added: 1)  Est. Patient Level IV [[address]]

## 2010-11-24 NOTE — Assessment & Plan Note (Signed)
Summary: headache x3 days/cd   Vital Signs:  Patient profile:   35 year old female Height:      67 inches Weight:      304.50 pounds BMI:     47.86 O2 Sat:      97 % on Room air Temp:     97.5 degrees F oral Pulse rate:   71 / minute BP sitting:   114 / 72  (left arm) Cuff size:   large  Vitals Entered By: Zella Ball Ewing CMA Duncan Dull) (June 24, 2010 2:52 PM)  O2 Flow:  Room air CC: Headache for 3 days/RE   Primary Care Provider:  [name] MD  CC:  Headache for 3 days/RE.  History of Present Illness: here with acute onset 3 days mod and now severe right side headache, worse bilat temporal and occipital with throbbing, photophobia and n/v, with tylenol and flurbiprofen not working as it has in the past;  pain worse to bend over ,  assoc with mild dizziness and no syncope, on falls, injury, fever.  Pt denies CP, worsening sob, doe, wheezing, orthopnea, pnd, worsening LE edema, palps, dizziness or syncope  Pt denies new neuro symptoms such as facial or extremity weakness . 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) 6)  Vaginitis, Candidal  (ICD-112.1) 7)  Diarrhea, Acute  (ICD-787.91) 8)  Menorrhagia  (ICD-626.2) 9)  Pneumonia  (ICD-486) 10)  Abdominal Pain, Lower  (ICD-789.09) 11)  Fever Unspecified  (ICD-780.60) 12)  Otitis Media, Acute, Bilateral  (ICD-382.9) 13)  Hyperlipidemia  (ICD-272.4) 14)  Glucose Intolerance  (ICD-271.3) 15)  Anemia-iron Deficiency  (ICD-280.9) 16)  Anxiety  (ICD-300.00) 17)  Smoker  (ICD-305.1) 18)  Wheezing  (ICD-786.07) 19)  Migraine Headache  (ICD-346.90) 20)  Gerd  (ICD-530.81) 21)  Peptic Ulcer Disease  (ICD-533.90) 22)  Glucose Intolerance   (ICD-271.3) 23)  Morbid Obesity  (ICD-278.01)  Medications Prior to Update: 1)  Omeprazole 20 Mg Cpdr (Omeprazole) .... 2 By Mouth Once Daily 2)  Meclizine Hcl 12.5 Mg Tabs (Meclizine Hcl) .Marland Kitchen.. 1po Q 6 Hrs As Needed Dizzy 3)  Alprazolam 0.5 Mg Tabs (Alprazolam) .Marland Kitchen.. 1 By Mouth Two Times A Day As Needed 4)  Oxycodone Hcl 5 Mg Tabs (Oxycodone Hcl) .Marland Kitchen.. 1po Q 6 Hrs As Needed Pain 5)  Fluoxetine Hcl 20 Mg Caps (Fluoxetine Hcl) .Marland Kitchen.. 1 By Mouth Once Daily 6)  Tessalon Perles 100 Mg Caps (Benzonatate) .Marland Kitchen.. 1-2 By Mouth Three Times A Day As Needed Cough 7)  Symbicort 160-4.5 Mcg/act Aero (Budesonide-Formoterol Fumarate) .... 2 Puffs Two Times A Day 8)  Proair Hfa 108 (90 Base) Mcg/act Aers (Albuterol Sulfate) .... 2 Puffs Four Times Per Day As Needed 9)  Fluconazole 150 Mg Tabs (Fluconazole) .Marland Kitchen.. 1po Every Other Day As Needed Yeast Infection 10)  Prednisone 10 Mg  Tabs (Prednisone) .... Take As Directed 11)  Prednisone 10 Mg  Tabs (Prednisone) .... 4 Each Day For Next 3 Weeks, Then Decrease To 3 Each Day Until Next Visit With Me. 12)  Zolpidem Tartrate 12.5 Mg Cr-Tabs (Zolpidem Tartrate) .Marland Kitchen.. 1 By Mouth At Bedtime As Needed  Current Medications (verified): 1)  Omeprazole 20 Mg Cpdr (Omeprazole) .... 2 By Mouth Once Daily 2)  Meclizine Hcl 12.5 Mg  Tabs (Meclizine Hcl) .Marland Kitchen.. 1po Q 6 Hrs As Needed Dizzy 3)  Alprazolam 0.5 Mg Tabs (Alprazolam) .Marland Kitchen.. 1 By Mouth Two Times A Day As Needed 4)  Fluoxetine Hcl 20 Mg Caps (Fluoxetine Hcl) .Marland Kitchen.. 1 By Mouth Once Daily 5)  Tessalon Perles 100 Mg Caps (Benzonatate) .Marland Kitchen.. 1-2 By Mouth Three Times A Day As Needed Cough 6)  Symbicort 160-4.5 Mcg/act Aero (Budesonide-Formoterol Fumarate) .... 2 Puffs Two Times A Day 7)  Proair Hfa 108 (90 Base) Mcg/act Aers (Albuterol Sulfate) .... 2 Puffs Four Times Per Day As Needed 8)  Fluconazole 150 Mg Tabs (Fluconazole) .Marland Kitchen.. 1po Every Other Day As Needed Yeast Infection 9)  Prednisone 10 Mg  Tabs (Prednisone) .... Take As  Directed 10)  Prednisone 10 Mg  Tabs (Prednisone) .... 4 Each Day For Next 3 Weeks, Then Decrease To 3 Each Day Until Next Visit With Me. 11)  Zolpidem Tartrate 12.5 Mg Cr-Tabs (Zolpidem Tartrate) .Marland Kitchen.. 1 By Mouth At Bedtime As Needed 12)  Fioricet 50-325-40 Mg Tabs (Butalbital-Apap-Caffeine) .Marland Kitchen.. 1po Once Daily As Needed  Allergies (verified): 1)  ! Pcn 2)  ! Percocet  Past History:  Past Medical History: Last updated: 05/15/2010 Morbid Obesity Glucose Intolerance Peptic ulcer disease GERD Migraine Anxiety Anemia-iron deficiency glucose intolerance Hyperlipidemia menorrhoagia PNA episode july 2011  GYN:  Dr Rosemary Holms  Past Surgical History: Last updated: 05/05/2010 s/p uterine ablation  April 21, 2010  Social History: Last updated: 05/21/2010 pt is single and lives with Dalbert Batman pt has no children. pt works as a Human resources officer. former smoker.  started at age 43.  less than 1 ppd.  quit July 2011. Alcohol use-yes Drug use-no  Risk Factors: Smoking Status: current (12/11/2009)  Review of Systems       all otherwise negative per pt -    Physical Exam  General:  alert and overweight-appearing.   Head:  normocephalic and atraumatic.   Eyes:  vision grossly intact, pupils equal, and pupils round.   Ears:  R ear normal and L ear normal.   Nose:  no external deformity and no nasal discharge.   Mouth:  no gingival abnormalities and pharynx pink and moist.   Neck:  supple and no masses.   Lungs:  normal respiratory effort and normal breath sounds.   Heart:  normal rate and regular rhythm.   Extremities:  no edema, no erythema  Neurologic:  cranial nerves II-XII intact, strength normal in all extremities, and gait normal.     Impression & Recommendations:  Problem # 1:  MIGRAINE HEADACHE (ICD-346.90)  The following medications were removed from the medication list:    Oxycodone Hcl 5 Mg Tabs (Oxycodone hcl) .Marland Kitchen... 1po q 6 hrs as needed pain Her updated  medication list for this problem includes:    Fioricet 50-325-40 Mg Tabs (Butalbital-apap-caffeine) .Marland Kitchen... 1po once daily as needed 3 days, severe, missed work today;  for work note, demerol/phenergan 50/25 mg IM, and home with fioricet as needed   Orders: Demerol / Phenergan Injection (Y8657) Admin of Therapeutic Inj  intramuscular or subcutaneous (84696)  Complete Medication List: 1)  Omeprazole 20 Mg Cpdr (Omeprazole) .... 2 by mouth once daily 2)  Meclizine Hcl 12.5 Mg Tabs (Meclizine hcl) .Marland Kitchen.. 1po q 6 hrs as needed dizzy 3)  Alprazolam 0.5 Mg Tabs (Alprazolam) .Marland Kitchen.. 1 by mouth two times a day as needed 4)  Fluoxetine Hcl 20 Mg Caps (Fluoxetine hcl) .Marland Kitchen.. 1 by mouth once daily 5)  Tessalon Perles 100 Mg  Caps (Benzonatate) .Marland Kitchen.. 1-2 by mouth three times a day as needed cough 6)  Symbicort 160-4.5 Mcg/act Aero (Budesonide-formoterol fumarate) .... 2 puffs two times a day 7)  Proair Hfa 108 (90 Base) Mcg/act Aers (Albuterol sulfate) .... 2 puffs four times per day as needed 8)  Fluconazole 150 Mg Tabs (Fluconazole) .Marland Kitchen.. 1po every other day as needed yeast infection 9)  Prednisone 10 Mg Tabs (Prednisone) .... Take as directed 10)  Prednisone 10 Mg Tabs (Prednisone) .... 4 each day for next 3 weeks, then decrease to 3 each day until next visit with me. 11)  Zolpidem Tartrate 12.5 Mg Cr-tabs (Zolpidem tartrate) .Marland Kitchen.. 1 by mouth at bedtime as needed 12)  Fioricet 50-325-40 Mg Tabs (Butalbital-apap-caffeine) .Marland Kitchen.. 1po once daily as needed  Patient Instructions: 1)  you had the pain/nausea shot today (demerol/phenergan) 2)  you are given the work note today 3)  Please take all new medications as prescribed - the fioricet as needed further headaches (please use sparingly) 4)  Continue all previous medications as before this visit  5)  Please schedule a follow-up appointment as needed. Prescriptions: FIORICET 50-325-40 MG TABS (BUTALBITAL-APAP-CAFFEINE) 1po once daily as needed  #30 x 0   Entered  and Authorized by:   [name] MD   Signed by:   [name] MD on 06/24/2010   Method used:   Print then Give to Patient   RxID:   [id number]    Medication Administration  Injection # 1:    Medication: Demerol / Phenergan Injection    Diagnosis: MIGRAINE HEADACHE (ICD-346.90)    Route: IM    Site: LUOQ gluteus    Exp Date: 08/2011    Lot #: [id number]    Mfr: Baxter    Comments: Patient received Demerol 50mg , FAO#[id number], exp.12/24/2010 Patient received 25mg  Phenergan, QIO#[id number], exp 08/2011    Patient tolerated injection without complications    Given by: Zella Ball Ewing CMA Duncan Dull) (June 24, 2010 4:01 PM)  Orders Added: 1)  Demerol / Phenergan Injection [J2180] 2)  Admin of Therapeutic Inj  intramuscular or subcutaneous [96372] 3)  Est. Patient Level III [84132]

## 2010-11-24 NOTE — Assessment & Plan Note (Signed)
Summary: hives worst and cant put clothes on-lb   Vital Signs:  Patient profile:   35 year old female Height:      67 inches Weight:      298 pounds BMI:     46.84 O2 Sat:      96 % on Room air Temp:     98.4 degrees F oral Pulse rate:   84 / minute BP sitting:   140 / 78  (left arm) Cuff size:   large  Vitals Entered By: Zella Ball Ewing CMA Duncan Dull) (September 30, 2010 2:52 PM)  O2 Flow:  Room air CC: hives no better/RE   Primary Care Provider:  [name] MD  CC:  hives no better/RE.  History of Present Illness: pt here with acute - unfortunately still with severe hives with itching that do not seem much improved at all after being seen yesterday, tx with high dose predpack per Dr Felicity Coyer;  has been scratching so hard she has bruises to the right bicep area, bilat upper abd area.  Today in the office, new hive rather large occurred to the post right arm without scratching that area prior.  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  Has been taking the benadryl round the clock.  Roomate with hives approx 2 mo ago resolved and not recurred.   No tongue or other swelling, sob or wheezing or other rash. Overall good compliance with meds, and good tolerability.  Pt denies CP, worsening sob, doe, wheezing, orthopnea, pnd, worsening LE edema, palps, dizziness or syncope  This is fourth medical exposure for same problem - 2 in the ER, and now 2 in this clinic  Problems Prior to Update: 1)  Urticaria  (ICD-708.9) 2)  Depression  (ICD-311) 3)  Pain in Joint, Multiple Sites  (ICD-719.49) 4)  Bronchitis, Acute  (ICD-466.0) 5)  Malaise  (ICD-780.79) 6)  Diabetes Mellitus, Type II  (ICD-250.00) 7)  Peripheral Edema  (ICD-782.3) 8)  Sinusitis- Acute-nos  (ICD-461.9) 9)  Thrush  (ICD-112.0) 10)  Frequency, Urinary  (ICD-788.41) 11)   Epigastric Tenderness  (ICD-789.66) 12)  Insomnia-sleep Disorder-unspec  (ICD-780.52) 13)  Pulmonary Sarcoidosis  (ICD-135) 14)  Dyspnea  (ICD-786.05) 15)  Mediastinal Lymphadenopathy  (ICD-785.6) 16)  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) 17)  Vaginitis, Candidal  (ICD-112.1) 18)  Diarrhea, Acute  (ICD-787.91) 19)  Menorrhagia  (ICD-626.2) 20)  Pneumonia  (ICD-486) 21)  Abdominal Pain, Lower  (ICD-789.09) 22)  Fever Unspecified  (ICD-780.60) 23)  Otitis Media, Acute, Bilateral  (ICD-382.9) 24)  Hyperlipidemia  (ICD-272.4) 25)  Glucose Intolerance  (ICD-271.3) 26)  Anemia-iron Deficiency  (ICD-280.9) 27)  Anxiety  (ICD-300.00) 28)  Smoker  (ICD-305.1) 29)  Wheezing  (ICD-786.07) 30)  Migraine Headache  (ICD-346.90) 31)  Gerd  (ICD-530.81) 32)  Peptic Ulcer Disease  (ICD-533.90) 33)  Glucose Intolerance  (ICD-271.3) 34)  Morbid Obesity  (ICD-278.01)  Medications Prior to Update: 1)  Omeprazole 20 Mg Cpdr (Omeprazole) .... 2 By Mouth Once Daily 2)  Meclizine Hcl 12.5 Mg Tabs (Meclizine Hcl) .Marland Kitchen.. 1po Q 6 Hrs As Needed Dizzy 3)  Alprazolam 0.5 Mg Tabs (Alprazolam) .Marland Kitchen.. 1 By Mouth Two Times A Day As Needed 4)  Fluoxetine Hcl 40 Mg Caps (Fluoxetine Hcl) .Marland Kitchen.. 1 By Mouth Once Daily 5)  Symbicort 160-4.5 Mcg/act Aero (Budesonide-Formoterol  Fumarate) .... 2 Puffs Two Times A Day 6)  Proair Hfa 108 (90 Base) Mcg/act Aers (Albuterol Sulfate) .... 2 Puffs Four Times Per Day As Needed 7)  Zolpidem Tartrate 12.5 Mg Cr-Tabs (Zolpidem Tartrate) .Marland Kitchen.. 1 By Mouth At Bedtime As Needed 8)  Onetouch Test  Strp (Glucose Blood) .... Use Asd Four Times Per Day 9)  Lancets  Misc (Lancets) .... Use Asd Four Times Per Day 10)  Janumet 50-500 Mg Tabs (Sitagliptin-Metformin Hcl) .Marland Kitchen.. 1poi Two Times A Day 11)  Ferrous Sulfate 325 (65 Fe) Mg Tbec (Ferrous Sulfate) .Marland Kitchen.. 1po Two Times A Day 12)  Furosemide 20 Mg Tabs (Furosemide) .... Take 1 Tab By Mouth Every Day 13)  Tylenol Arthritis Pain  650 Mg Cr-Tabs (Acetaminophen) .Marland Kitchen.. 1-2 By Mouth Three Times A Day As Needed 14)  Celebrex 200 Mg Caps (Celecoxib) .... Take 1 Tablet By Mouth Once A Day As Needed 15)  Prednisone (Pak) 10 Mg Tabs (Prednisone) .... As Directed X 6 Days  Current Medications (verified): 1)  Omeprazole 20 Mg Cpdr (Omeprazole) .... 2 By Mouth Once Daily 2)  Meclizine Hcl 12.5 Mg Tabs (Meclizine Hcl) .Marland Kitchen.. 1po Q 6 Hrs As Needed Dizzy 3)  Alprazolam 0.5 Mg Tabs (Alprazolam) .Marland Kitchen.. 1 By Mouth Two Times A Day As Needed 4)  Fluoxetine Hcl 40 Mg Caps (Fluoxetine Hcl) .Marland Kitchen.. 1 By Mouth Once Daily 5)  Symbicort 160-4.5 Mcg/act Aero (Budesonide-Formoterol Fumarate) .... 2 Puffs Two Times A Day 6)  Proair Hfa 108 (90 Base) Mcg/act Aers (Albuterol Sulfate) .... 2 Puffs Four Times Per Day As Needed 7)  Zolpidem Tartrate 12.5 Mg Cr-Tabs (Zolpidem Tartrate) .Marland Kitchen.. 1 By Mouth At Bedtime As Needed 8)  Onetouch Test  Strp (Glucose Blood) .... Use Asd Four Times Per Day 9)  Lancets  Misc (Lancets) .... Use Asd Four Times Per Day 10)  Janumet 50-500 Mg Tabs (Sitagliptin-Metformin Hcl) .Marland Kitchen.. 1poi Two Times A Day 11)  Ferrous Sulfate 325 (65 Fe) Mg Tbec (Ferrous Sulfate) .Marland Kitchen.. 1po Two Times A Day 12)  Furosemide 20 Mg Tabs (Furosemide) .... Take 1 Tab By Mouth Every Day 13)  Tylenol Arthritis Pain 650 Mg Cr-Tabs (Acetaminophen) .Marland Kitchen.. 1-2 By Mouth Three Times A Day As Needed 14)  Celebrex 200 Mg Caps (Celecoxib) .... Take 1 Tablet By Mouth Once A Day As Needed 15)  Prednisone (Pak) 10 Mg Tabs (Prednisone) .... As Directed X 6 Days 16)  Hydroxyzine Hcl 25 Mg Tabs (Hydroxyzine Hcl) .Marland Kitchen.. 1 - 2 By Mouth Q 6 Hrs As Needed Itching  Allergies (verified): 1)  ! Pcn 2)  ! Percocet 3)  ! * Zpack  Past History:  Past Surgical History: Last updated: 05/05/2010 s/p uterine ablation  April 21, 2010  Social History: Last updated: 07/31/2010 pt is single and lives with Dalbert Batman - also pt of [facility] pt has no children. pt works as a Recruitment consultant. former smoker.  started at age 28.  less than 1 ppd.  quit July 2011. Alcohol use-yes Drug use-no  Risk Factors: Smoking Status: current (12/11/2009)  Past Medical History: Morbid Obesity Peptic ulcer disease GERD Migraine Anxiety Anemia-iron deficiency Hyperlipidemia menorrhoagia PNA episode july 2011 GYN:  Dr Rosemary Holms Diabetes mellitus, type II - steroid related Depression sarcoid - including hand per Rheumatology - Dr Dierdre Forth  Review of Systems       all otherwise negative per pt -    Physical Exam  General:  NAD, alert and overweight-appearing.  sister (?friend) at side, scratching  incessantly Head:  normocephalic and atraumatic.   Eyes:  vision grossly intact; pupils equal, round and reactive to light.  conjunctiva and lids normal.    Ears:  R ear normal and L ear normal.   Nose:  no external deformity and no nasal discharge.   Mouth:  pharyngeal erythema and fair dentition.   Neck:  supple and no masses.   Lungs:  normal respiratory effort, no intercostal retractions or use of accessory muscles; normal breath sounds bilaterally - no crackles and no wheezes.    Heart:  normal rate, regular rhythm, no murmur, and no rub. BLE without edema. Extremities:  no edema, no erythema  Skin:  hives B forearms and trunk - swelling diffuse of B hands/digits, with several areas or excoriation and mild bruising right upper arm and upper abd Psych:  slightly anxious.     Impression & Recommendations:  Problem # 1:  URTICARIA (ICD-708.9) unclear etiollgy;  I explaiined to pt that steroid effect does not start for 18 hrs after treatment start, so OK for depomedrol IM now, cont same oral predpack, cont benadryl as needed, also atarax as needed for itching, and zantac OTC 150 two times a day;  pt declines allergist at this time Orders: Depo- Medrol 40mg  (J1030) Depo- Medrol 80mg  (J1040) Admin of Therapeutic Inj  intramuscular or subcutaneous (16109)  Problem # 2:  PAIN IN  JOINT, MULTIPLE SITES (ICD-719.49) with recent dx sarcoid related hand involvement;  treat as above, f/u any worsening signs or symptoms , ok to start the celebrex soon, but I would wait until after the predpack completed  Problem # 3:  DIABETES MELLITUS, TYPE II (ICD-250.00)  Her updated medication list for this problem includes:    Janumet 50-500 Mg Tabs (Sitagliptin-metformin hcl) .Marland Kitchen... 1poi two times a day  Labs Reviewed: Creat: 0.7 (09/01/2010)    Reviewed HgBA1c results: 9.9 (07/31/2010)  10.8 (07/07/2010) stable overall by hx and exam, ok to continue meds/tx as is , to call for onset polys or sugar > 200 on the steroid tx  Complete Medication List: 1)  Omeprazole 20 Mg Cpdr (Omeprazole) .... 2 by mouth once daily 2)  Meclizine Hcl 12.5 Mg Tabs (Meclizine hcl) .Marland Kitchen.. 1po q 6 hrs as needed dizzy 3)  Alprazolam 0.5 Mg Tabs (Alprazolam) .Marland Kitchen.. 1 by mouth two times a day as needed 4)  Fluoxetine Hcl 40 Mg Caps (Fluoxetine hcl) .Marland Kitchen.. 1 by mouth once daily 5)  Symbicort 160-4.5 Mcg/act Aero (Budesonide-formoterol fumarate) .... 2 puffs two times a day 6)  Proair Hfa 108 (90 Base) Mcg/act Aers (Albuterol sulfate) .... 2 puffs four times per day as needed 7)  Zolpidem Tartrate 12.5 Mg Cr-tabs (Zolpidem tartrate) .Marland Kitchen.. 1 by mouth at bedtime as needed 8)  Onetouch Test Strp (Glucose blood) .... Use asd four times per day 9)  Lancets Misc (Lancets) .... Use asd four times per day 10)  Janumet 50-500 Mg Tabs (Sitagliptin-metformin hcl) .Marland Kitchen.. 1poi two times a day 11)  Ferrous Sulfate 325 (65 Fe) Mg Tbec (Ferrous sulfate) .Marland Kitchen.. 1po two times a day 12)  Furosemide 20 Mg Tabs (Furosemide) .... Take 1 tab by mouth every day 13)  Tylenol Arthritis Pain 650 Mg Cr-tabs (Acetaminophen) .Marland Kitchen.. 1-2 by mouth three times a day as needed 14)  Celebrex 200 Mg Caps (Celecoxib) .... Take 1 tablet by mouth once a day as needed 15)  Prednisone (pak) 10 Mg Tabs (Prednisone) .... As directed x 6 days 16)  Hydroxyzine Hcl  25  Mg Tabs (Hydroxyzine hcl) .Marland Kitchen.. 1 - 2 by mouth q 6 hrs as needed itching  Patient Instructions: 1)  you had the steroid shot today 2)  Please take all new medications as prescribed  - the atarax generic for itching 3)  Continue all previous medications as before this visit  4)  please also take zantac generic (ranitidine) 150 mg two times a day  that could help as well 5)  Please call if you feel you need the allergy referral 6)  You are given the work note today 7)  Please schedule a follow-up appointment as needed. Prescriptions: HYDROXYZINE HCL 25 MG TABS (HYDROXYZINE HCL) 1 - 2 by mouth q 6 hrs as needed itching  #60 x 1   Entered and Authorized by:   [name] MD   Signed by:   [name] MD on 09/30/2010   Method used:   Print then Give to Patient   RxID:   [id number]    Medication Administration  Injection # 1:    Medication: Depo- Medrol 40mg     Diagnosis: URTICARIA (ICD-708.9)    Route: IM    Site: LUOQ gluteus    Exp Date: 01/2013    Lot #: [id number]    Mfr: [employer]    Comments: patient received 120mg  Depo-medrol    Patient tolerated injection without complications    Given by: Zella Ball Ewing CMA Duncan Dull) (September 30, 2010 3:36 PM)  Injection # 2:    Medication: Depo- Medrol 80mg     Diagnosis: URTICARIA (ICD-708.9)    Route: IM    Site: LUOQ gluteus    Exp Date: 01/2013    Lot #: [id number]    Mfr: [employer]    Given by: Zella Ball Ewing CMA Duncan Dull) (September 30, 2010 3:36 PM)  Orders Added: 1)  Depo- Medrol 40mg  [J1030] 2)  Depo- Medrol 80mg  [J1040] 3)  Admin of Therapeutic Inj  intramuscular or subcutaneous [96372] 4)  Est. Patient Level IV [56213]     Appended Document: hives worst and cant put clothes on-lb add for HPI:  Pt denies polydipsia, polyuria, or low sugar symptoms such as shakiness improved with eating.  Overall good compliance with meds, trying to follow low chol, DM diet, wt stable, little excercise however

## 2010-11-24 NOTE — Assessment & Plan Note (Signed)
Summary: stopped taking steroids, feeling worse/cd   Vital Signs:  Patient profile:   35 year old female Height:      67 inches Weight:      287.38 pounds BMI:     45.17 O2 Sat:      97 % on Room air Temp:     98.6 degrees F oral Pulse rate:   117 / minute BP sitting:   116 / 74  (left arm) Cuff size:   large  Vitals Entered By: Zella Ball Ewing CMA (AAMA) (July 07, 2010 10:18 AM)  O2 Flow:  Room air CC: Body aches, no appetite, thirsty/RE   Primary Care Provider:  [name] MD  CC:  Body aches, no appetite, and thirsty/RE.  History of Present Illness: here to f/u - feels "flu-like" with aches to arms, upper back, neck ;  fees naueseaus  persistent,  had vomit x 3 four days ago, but nausea med has helped since then; no overt bleeding;  feels overall thirsty since been tkaing the prednisone, and ? worse since off the prednisone.  Has hx of glucose intolerance but normal a1c in july 2011. Also tx for thrush sept 4 per urgent care - overall improved, but water still does not taste right.  Pt denies CP except with drinking cold water and belching helped, worsening sob, doe, wheezing, orthopnea, pnd, worsening LE edema, palps,  syncope .  Has been some dizzy with standing up too fast  during the day.   Pt denies new neuro symptoms such as headache, facial or extremity weakness, except does have 3 fingers numb on the right hand for 3 days last wk, now resolved.  Last migraine was last wk.  No fever,  night sweats, loss of appetite or other constitutional symptoms , except for ? cold chills last night. Wt loss   + - down to 287 from 304 over 2 wks since last visit.  off the prednisone since sat sept 10.  has freq urination up to 4 -5 toimes per hour. Some increased blurry vision noted recetnly as well.  Not taking the PPI for some weeks. 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) 9)  Vaginitis, Candidal  (ICD-112.1) 10)  Diarrhea, Acute  (ICD-787.91) 11)  Menorrhagia  (ICD-626.2) 12)  Pneumonia  (ICD-486) 13)  Abdominal Pain, Lower  (ICD-789.09) 14)  Fever Unspecified  (ICD-780.60) 15)  Otitis Media, Acute, Bilateral  (ICD-382.9) 16)  Hyperlipidemia  (ICD-272.4) 17)  Glucose Intolerance  (ICD-271.3) 18)  Anemia-iron Deficiency  (ICD-280.9) 19)  Anxiety  (ICD-300.00) 20)  Smoker  (ICD-305.1) 21)  Wheezing  (ICD-786.07) 22)  Migraine Headache  (ICD-346.90) 23)  Gerd  (ICD-530.81) 24)  Peptic Ulcer Disease  (ICD-533.90) 25)  Glucose Intolerance  (ICD-271.3) 26)  Morbid Obesity  (ICD-278.01)  Medications Prior to Update: 1)  Omeprazole 20 Mg Cpdr (Omeprazole) .... 2 By Mouth Once Daily 2)  Meclizine Hcl 12.5 Mg Tabs (Meclizine Hcl) .Marland Kitchen.. 1po Q 6 Hrs As Needed Dizzy 3)  Alprazolam 0.5 Mg Tabs (Alprazolam) .Marland Kitchen.. 1 By Mouth Two Times A Day As Needed 4)  Fluoxetine Hcl 20 Mg Caps (Fluoxetine Hcl) .Marland Kitchen.. 1 By Mouth Once Daily 5)  Tessalon Perles 100 Mg Caps (  Benzonatate) .Marland Kitchen.. 1-2 By Mouth Three Times A Day As Needed Cough 6)  Symbicort 160-4.5 Mcg/act Aero (Budesonide-Formoterol Fumarate) .... 2 Puffs Two Times A Day 7)  Proair Hfa 108 (90 Base) Mcg/act Aers (Albuterol Sulfate) .... 2 Puffs Four Times Per Day As Needed 8)  Fluconazole 150 Mg Tabs (Fluconazole) .Marland Kitchen.. 1po Every Other Day As Needed Yeast Infection 9)  Prednisone 10 Mg  Tabs (Prednisone) .... Take As Directed 10)  Prednisone 10 Mg  Tabs (Prednisone) .... 4 Each Day For Next 3 Weeks, Then Decrease To 3 Each Day Until Next Visit With Me. 11)  Zolpidem Tartrate 12.5 Mg Cr-Tabs (Zolpidem Tartrate) .Marland Kitchen.. 1 By Mouth At Bedtime As Needed 12)  Fioricet 50-325-40 Mg Tabs (Butalbital-Apap-Caffeine) .Marland Kitchen..  1po Once Daily As Needed  Current Medications (verified): 1)  Omeprazole 20 Mg Cpdr (Omeprazole) .... 2 By Mouth Once Daily 2)  Meclizine Hcl 12.5 Mg Tabs (Meclizine Hcl) .Marland Kitchen.. 1po Q 6 Hrs As Needed Dizzy 3)  Alprazolam 0.5 Mg Tabs (Alprazolam) .Marland Kitchen.. 1 By Mouth Two Times A Day As Needed 4)  Fluoxetine Hcl 20 Mg Caps (Fluoxetine Hcl) .Marland Kitchen.. 1 By Mouth Once Daily 5)  Tessalon Perles 100 Mg Caps (Benzonatate) .Marland Kitchen.. 1-2 By Mouth Three Times A Day As Needed Cough 6)  Symbicort 160-4.5 Mcg/act Aero (Budesonide-Formoterol Fumarate) .... 2 Puffs Two Times A Day 7)  Proair Hfa 108 (90 Base) Mcg/act Aers (Albuterol Sulfate) .... 2 Puffs Four Times Per Day As Needed 8)  Fluconazole 150 Mg Tabs (Fluconazole) .Marland Kitchen.. 1po Every Other Day As Needed Yeast Infection 9)  Prednisone 10 Mg  Tabs (Prednisone) .... Take As Directed 10)  Prednisone 10 Mg  Tabs (Prednisone) .... 4 Each Day For Next 3 Weeks, Then Decrease To 3 Each Day Until Next Visit With Me. 11)  Zolpidem Tartrate 12.5 Mg Cr-Tabs (Zolpidem Tartrate) .Marland Kitchen.. 1 By Mouth At Bedtime As Needed 12)  Fioricet 50-325-40 Mg Tabs (Butalbital-Apap-Caffeine) .Marland Kitchen.. 1po Once Daily As Needed  Allergies (verified): 1)  ! Pcn 2)  ! Percocet  Past History:  Past Medical History: Last updated: 05/15/2010 Morbid Obesity Glucose Intolerance Peptic ulcer disease GERD Migraine Anxiety Anemia-iron deficiency glucose intolerance Hyperlipidemia menorrhoagia PNA episode july 2011  GYN:  Dr Rosemary Holms  Past Surgical History: Last updated: 05/05/2010 s/p uterine ablation  April 21, 2010  Social History: Last updated: 05/21/2010 pt is single and lives with Dalbert Batman pt has no children. pt works as a Human resources officer. former smoker.  started at age 3.  less than 1 ppd.  quit July 2011. Alcohol use-yes Drug use-no  Risk Factors: Smoking Status: current (12/11/2009)  Review of Systems       all otherwise negative per pt -    Physical Exam  General:  alert and  overweight-appearing.   Head:  normocephalic and atraumatic.   Eyes:  vision grossly intact, pupils equal, and pupils round.   Ears:  R ear normal and L ear normal.   Nose:  no external deformity and no nasal discharge.   Mouth:  no gingival abnormalities and pharynx pink and moist.  , no thrush noted Neck:  supple and no masses.   Lungs:  normal respiratory effort and normal breath sounds.   Heart:  normal rate and regular rhythm.   Abdomen:  soft and normal bowel sounds.  but mild tender epigstrium and mid lower abd without guarding or rebound Msk:  no joint tenderness and no joint swelling.   Extremities:  no edema, no erythema  Neurologic:  cranial nerves II-XII intact, strength normal in all extremities, and gait normal.     Impression & Recommendations:  Problem # 1:  GLUCOSE INTOLERANCE (ICD-271.3)  ? DM out of control with blurry vision and polydipsia/polyuria, wt loss - for labs check today, also urinary studies with freq urination   Orders: TLB-A1C / Hgb A1C (Glycohemoglobin) (83036-A1C) TLB-BMP (Basic Metabolic Panel-BMET) (80048-METABOL)  Problem # 2:  EPIGASTRIC TENDERNESS (ICD-789.66)  to re-start the PPI, also for abd u/s to check GB  Orders: Radiology Referral (Radiology) TLB-Hepatic/Liver Function Pnl (80076-HEPATIC) TLB-CBC Platelet - w/Differential (85025-CBCD) TLB-Sedimentation Rate (ESR) (85652-ESR)  Problem # 3:  ANEMIA-IRON DEFICIENCY (ICD-280.9)  for f/u lab today  Orders: TLB-IBC Pnl (Iron/FE;Transferrin) (83550-IBC)  Problem # 4:  GERD (ICD-530.81)  Her updated medication list for this problem includes:    Omeprazole 20 Mg Cpdr (Omeprazole) .Marland Kitchen... 2 by mouth once daily treat as above, f/u any worsening signs or symptoms   Problem # 5:  THRUSH (ICD-112.0) improved ; no further tx needed  Complete Medication List: 1)  Omeprazole 20 Mg Cpdr (Omeprazole) .... 2 by mouth once daily 2)  Meclizine Hcl 12.5 Mg Tabs (Meclizine hcl) .Marland Kitchen.. 1po q 6 hrs  as needed dizzy 3)  Alprazolam 0.5 Mg Tabs (Alprazolam) .Marland Kitchen.. 1 by mouth two times a day as needed 4)  Fluoxetine Hcl 20 Mg Caps (Fluoxetine hcl) .Marland Kitchen.. 1 by mouth once daily 5)  Tessalon Perles 100 Mg Caps (Benzonatate) .Marland Kitchen.. 1-2 by mouth three times a day as needed cough 6)  Symbicort 160-4.5 Mcg/act Aero (Budesonide-formoterol fumarate) .... 2 puffs two times a day 7)  Proair Hfa 108 (90 Base) Mcg/act Aers (Albuterol sulfate) .... 2 puffs four times per day as needed 8)  Fluconazole 150 Mg Tabs (Fluconazole) .Marland Kitchen.. 1po every other day as needed yeast infection 9)  Prednisone 10 Mg Tabs (Prednisone) .... Take as directed 10)  Prednisone 10 Mg Tabs (Prednisone) .... 4 each day for next 3 weeks, then decrease to 3 each day until next visit with me. 11)  Zolpidem Tartrate 12.5 Mg Cr-tabs (Zolpidem tartrate) .Marland Kitchen.. 1 by mouth at bedtime as needed 12)  Fioricet 50-325-40 Mg Tabs (Butalbital-apap-caffeine) .Marland Kitchen.. 1po once daily as needed  Other Orders: T-Culture, Urine ([id number]) TLB-Udip w/ Micro (81001-URINE)  Patient Instructions: 1)  please re-start the omeprazole for the stomach 2)  Please go to the Lab in the basement for your blood and/or urine tests today 3)  Please call the number on the [facility] Card for results of your testing  4)  You will be contacted about the referral(s) to: ultrasound 5)  We may need to start sugar medicine, or antibiotic depending on the  lab studies 6)  Please schedule a follow-up appointment as needed. Prescriptions: OMEPRAZOLE 20 MG CPDR (OMEPRAZOLE) 2 by mouth once daily  #180 x 3   Entered and Authorized by:   [name] MD   Signed by:   [name] MD on 07/07/2010   Method used:   Print then Give to Patient   RxID:   [id number]

## 2010-11-24 NOTE — Letter (Signed)
Summary: [facility]  [facility]   Imported By: Lester Parkwood 07/31/2010 07:14:51  _____________________________________________________________________  External Attachment:    Type:   Image     Comment:   External Document

## 2010-11-24 NOTE — Letter (Addendum)
Summary: Out of Work  [facility]  [address] [place], [place] [address]   Phone: [phone]  Fax: [phone]    October 01, 2010   Employee:  Nicole Melendez    To Whom It May Concern:   For Medical reasons, please excuse the above named employee from work for the following dates:  Start:   10/01/2010  End:   10/05/2010  If you need additional information, please feel free to contact our office.         Sincerely,    Dr. [name]

## 2010-11-24 NOTE — Letter (Signed)
Summary: Generic Letter  [facility]  [address] [place], [place] [address]   Phone: [phone]  Fax: [phone]    06/24/2010  [facility] [address] [place], [place]  [address]  Dear Ms. Ketchem,       This letter is to excuse you from work today  due to severe migraine headache flare.  You also  have been diagnosed with Pulmonary Sarcoidosis  which has been symptomatic for several months,  and only recently diagnosed and treated, and is   now improved.      Sincerely,   [name] MD

## 2010-11-24 NOTE — Assessment & Plan Note (Signed)
Summary: STILL WEAK/ SOB/ REFUSED MON APPT/NWS  #   Vital Signs:  Patient profile:   35 year old female Height:      67 inches Weight:      304 pounds BMI:     47.79 O2 Sat:      98 % on Room air Temp:     98.2 degrees F oral Pulse rate:   100 / minute BP sitting:   110 / 64  (left arm) Cuff size:   large  Vitals Entered By: Zella Ball Ewing CMA (AAMA) (May 19, 2010 9:26 AM)  O2 Flow:  Room air CC: Weak, SOB/RE   CC:  Weak and SOB/RE.  History of Present Illness: here with persistent fatigue and exhaustion - ? worse  -,  now with symptoms of fatigue walking from waiting room to the exam room;  some sob but better than last visit - inhalers seem to help;; still bleeding vaginally - stopped for one day, then re-started again yesterday;  also with pain to the left lower back and left side;  no radiation, no LLE pain/ weakness/numbness - pain worse to stand up or go up stairs;  makes her more nervous, has had to have help from friend going up the stairs;  Has somewhat loose stool/diarrhea , but no blood in the stool on the antibx.  No GU symptoms such as pain, freq, urgency, hematuria; does have the vagainl yeast infection symptoms of d/c since last visit.  Saw GYN last friday;  finished the progestin,  but  bleeding again the next day (sunday last) - called GYN earlier this am and re-started new rx for progestin;  Pt denies CP,  orthopnea, pnd, worsening LE edema, palps, or syncope .  cough helped by the perles, and wheezing improved.  No fever or chills or ST.  No  night sweats, loss of appetite or other constitutional symptoms except may have lost about 10 lbs with this illness.   Has hx of C diff and mult antibx recently.    Problems Prior to Update: 1)  Vaginitis, Candidal  (ICD-112.1) 2)  Diarrhea, Acute  (ICD-787.91) 3)  Menorrhagia  (ICD-626.2) 4)  Pneumonia  (ICD-486) 5)  Abdominal Pain, Lower  (ICD-789.09) 6)  Fever Unspecified  (ICD-780.60) 7)  Otitis Media, Acute, Bilateral   (ICD-382.9) 8)  Hyperlipidemia  (ICD-272.4) 9)  Glucose Intolerance  (ICD-271.3) 10)  Anemia-iron Deficiency  (ICD-280.9) 11)  Anxiety  (ICD-300.00) 12)  Smoker  (ICD-305.1) 13)  Wheezing  (ICD-786.07) 14)  Migraine Headache  (ICD-346.90) 15)  Gerd  (ICD-530.81) 16)  Peptic Ulcer Disease  (ICD-533.90) 17)  Glucose Intolerance  (ICD-271.3) 18)  Morbid Obesity  (ICD-278.01)  Medications Prior to Update: 1)  Omeprazole 20 Mg Cpdr (Omeprazole) .... 2 By Mouth Once Daily 2)  Meclizine Hcl 12.5 Mg Tabs (Meclizine Hcl) .Marland Kitchen.. 1po Q 6 Hrs As Needed Dizzy 3)  Alprazolam 0.25 Mg Tabs (Alprazolam) .... 1/2 - 1 By Mouth Once Daily As Needed 4)  Doxycycline Hyclate 100 Mg Caps (Doxycycline Hyclate) .Marland Kitchen.. 1 By Mouth Two Times A Day 5)  Oxycodone Hcl 5 Mg Tabs (Oxycodone Hcl) .Marland Kitchen.. 1po Q 6 Hrs As Needed Pain 6)  Fluoxetine Hcl 20 Mg Caps (Fluoxetine Hcl) .Marland Kitchen.. 1 By Mouth Once Daily 7)  Tessalon Perles 100 Mg Caps (Benzonatate) .Marland Kitchen.. 1-2 By Mouth Three Times A Day As Needed Cough 8)  Symbicort 160-4.5 Mcg/act Aero (Budesonide-Formoterol Fumarate) .... 2 Puffs Two Times A Day 9)  Proair Hfa 108 (  90 Base) Mcg/act Aers (Albuterol Sulfate) .... 2 Puffs Four Times Per Day As Needed  Current Medications (verified): 1)  Omeprazole 20 Mg Cpdr (Omeprazole) .... 2 By Mouth Once Daily 2)  Meclizine Hcl 12.5 Mg Tabs (Meclizine Hcl) .Marland Kitchen.. 1po Q 6 Hrs As Needed Dizzy 3)  Alprazolam 0.25 Mg Tabs (Alprazolam) .... 1/2 - 1 By Mouth Once Daily As Needed 4)  Oxycodone Hcl 5 Mg Tabs (Oxycodone Hcl) .Marland Kitchen.. 1po Q 6 Hrs As Needed Pain 5)  Fluoxetine Hcl 20 Mg Caps (Fluoxetine Hcl) .Marland Kitchen.. 1 By Mouth Once Daily 6)  Tessalon Perles 100 Mg Caps (Benzonatate) .Marland Kitchen.. 1-2 By Mouth Three Times A Day As Needed Cough 7)  Symbicort 160-4.5 Mcg/act Aero (Budesonide-Formoterol Fumarate) .... 2 Puffs Two Times A Day 8)  Proair Hfa 108 (90 Base) Mcg/act Aers (Albuterol Sulfate) .... 2 Puffs Four Times Per Day As Needed 9)  Metronidazole 250 Mg Tabs  (Metronidazole) .Marland Kitchen.. 1 By Mouth Qid For 10 Days 10)  Fluconazole 150 Mg Tabs (Fluconazole) .Marland Kitchen.. 1po Every Other Day As Needed Yeast Infection  Allergies (verified): 1)  ! Pcn  Past History:  Past Medical History: Last updated: 05/15/2010 Morbid Obesity Glucose Intolerance Peptic ulcer disease GERD Migraine Anxiety Anemia-iron deficiency glucose intolerance Hyperlipidemia menorrhoagia PNA episode july 2011  GYN:  Dr Rosemary Holms  Past Surgical History: Last updated: 05/05/2010 s/p uterine ablation  April 21, 2010  Family History: Last updated: 12/11/2009 DM HTN  Social History: Last updated: 05/15/2010 Married Current Smoker Alcohol use-yes Drug use-no  Risk Factors: Smoking Status: current (12/11/2009)  Review of Systems  The patient denies weight gain, vision loss, decreased hearing, hoarseness, chest pain, syncope, peripheral edema, prolonged cough, headaches, hemoptysis, melena, hematochezia, severe indigestion/heartburn, hematuria, suspicious skin lesions, depression, unusual weight change, abnormal bleeding, enlarged lymph nodes, and angioedema.         all otherwise negative per pt -    Physical Exam  General:  alert and overweight-appearing, mod ill appearing, sob with exertion to get up on the exam table, and appaers fatigued, weak Head:  normocephalic and atraumatic.  normocephalic and atraumatic.   Eyes:  vision grossly intact, pupils equal, and pupils round.  vision grossly intact, pupils equal, and pupils round.   Ears:  R ear normal and L ear normal.   Nose:  no external deformity and no nasal discharge.  no external deformity and no nasal discharge.   Mouth:  no gingival abnormalities and pharynx pink and moist.  no gingival abnormalities and pharynx pink and moist.   Neck:  supple and no masses.  supple and no masses.   Lungs:  normal respiratory effort and normal breath sounds.  normal respiratory effort and normal breath sounds.   Heart:  normal  rate and regular rhythm.  normal rate and regular rhythm.   Abdomen:  soft and normal bowel sounds.  but surprisingly moderate tender to mid upper and lower abd, as well left upper and lower and left side, as well as left flank and lumbar paravertebral areassoft and normal bowel sounds.   Msk:  no joint tenderness and no joint swelling.  except for mild warmth to the left kneeno joint tenderness and no joint swelling.   Extremities:  no edema, no erythema  Neurologic:  cranial nerves II-XII intact, strength normal in all extremities, sensation intact to light touch, and gait normal.  except for slight decreased sensation left mid ant thigh;cranial nerves II-XII intact, strength normal in all extremities, sensation intact to light  touch, and gait normal.   Skin:  color normal and no rashes.   Psych:  memory intact for recent and remote and normally interactive.     Impression & Recommendations:  Problem # 1:  DIARRHEA, ACUTE (ICD-787.91)  ? antibx assoc vs recurrent d ciff, but suspect the latter given the marked abd tenderness/back pain/tender as well;  will check stool tests, tx for c diff empiric with flagyl course; for routine labs as well   Orders: TLB-BMP (Basic Metabolic Panel-BMET) (80048-METABOL) TLB-CBC Platelet - w/Differential (85025-CBCD) TLB-Hepatic/Liver Function Pnl (80076-HEPATIC) TLB-Sedimentation Rate (ESR) (85652-ESR) T-Culture, C-Diff Toxin A/B ([address]-60454) T-Stool for O&P (09811-91478) T-Culture, Stool (87045/[id number]) T-Stool Giardia / Crypto- EIA (29562)  NOTE:   over 45 min spent on hx, physical exam, d/w pt of results of exam and plan for further eval and tx, review of recent labs in the [place] , and documentation time  Problem # 2:  VAGINITIS, CANDIDAL (ICD-112.1)  presumed per pt hx - tx empiric with diflucan asd  Her updated medication list for this problem includes:    Fluconazole 150 Mg Tabs (Fluconazole) .Marland Kitchen... 1po every other day as needed yeast  infection  Problem # 3:  MENORRHAGIA (ICD-626.2) with recurrent bleeding, recent hgb 9.0 - will persistent DOE will need to re-check, to re-start the progestin as per GYN this am, f/u with GYN  Problem # 4:  WHEEZING (ICD-786.07)  resolved with tx last visit; but with doe will check cxr to f/u also recent pna; ok to stop the doxy for now  Orders: T-2 View CXR, Same Day (71020.5TC)  Complete Medication List: 1)  Omeprazole 20 Mg Cpdr (Omeprazole) .... 2 by mouth once daily 2)  Meclizine Hcl 12.5 Mg Tabs (Meclizine hcl) .Marland Kitchen.. 1po q 6 hrs as needed dizzy 3)  Alprazolam 0.25 Mg Tabs (Alprazolam) .... 1/2 - 1 by mouth once daily as needed 4)  Oxycodone Hcl 5 Mg Tabs (Oxycodone hcl) .Marland Kitchen.. 1po q 6 hrs as needed pain 5)  Fluoxetine Hcl 20 Mg Caps (Fluoxetine hcl) .Marland Kitchen.. 1 by mouth once daily 6)  Tessalon Perles 100 Mg Caps (Benzonatate) .Marland Kitchen.. 1-2 by mouth three times a day as needed cough 7)  Symbicort 160-4.5 Mcg/act Aero (Budesonide-formoterol fumarate) .... 2 puffs two times a day 8)  Proair Hfa 108 (90 Base) Mcg/act Aers (Albuterol sulfate) .... 2 puffs four times per day as needed 9)  Metronidazole 250 Mg Tabs (Metronidazole) .Marland Kitchen.. 1 by mouth qid for 10 days 10)  Fluconazole 150 Mg Tabs (Fluconazole) .Marland Kitchen.. 1po every other day as needed yeast infection  Other Orders: TLB-Udip w/ Micro (81001-URINE) T-Culture, Urine ([id number]) TLB-A1C / Hgb A1C (Glycohemoglobin) (83036-A1C)  Patient Instructions: 1)  OK to stop the doxycycline 2)  start the metronidazole (flagyl) 3)  start the diflucan (for the yeast infection) 4)  Please go to the Lab in the basement for your blood and/or urine tests today , and stool tests 5)  Please go to Radiology in the basement level for your X-Ray today  6)  you are given the work note today 7)  Please schedule a follow-up appointment as needed. 8)  Call or go to the ER if the abd pain or diarrhea seems to be worse, or if you have nausea so that you cannot take  your medications Prescriptions: FLUCONAZOLE 150 MG TABS (FLUCONAZOLE) 1po every other day as needed yeast infection  #3 x 1   Entered and Authorized by:   [name] MD   Signed by:  [name] MD on 05/19/2010   Method used:   Print then Give to Patient   RxID:   [id number] METRONIDAZOLE 250 MG TABS (METRONIDAZOLE) 1 by mouth qid for 10 days  #40 x 0   Entered and Authorized by:   [name] MD   Signed by:   [name] MD on 05/19/2010   Method used:   Print then Give to Patient   RxID:   [id number]

## 2010-11-24 NOTE — Miscellaneous (Signed)
Summary: Orders Update  Clinical Lists Changes  Orders: Added new Test order of T-2 View CXR (71020TC) - Signed 

## 2010-11-24 NOTE — Assessment & Plan Note (Signed)
Summary: ov to discuss bronch results/mg   Copy to:  [name] Primary Provider/Referring Provider:  [name] MD  CC:  Pt is here for a f/u appt to discuss bronch results.  Pt states still sob with exertion and mostly non-productive cough but will occ cough up white to green colored sputum.  Marland Kitchen  History of Present Illness: The pt comes in today for f/u of her recent bronch results.  She was found to have non-necrotizing granulomas on tbbx, and her BAL did show a high percentage of lymphocytes (not eos).  All stains were negative.  I have reviewed the results with her in detail, and answered all of her questions.  Current Medications (verified): 1)  Omeprazole 20 Mg Cpdr (Omeprazole) .... 2 By Mouth Once Daily 2)  Meclizine Hcl 12.5 Mg Tabs (Meclizine Hcl) .Marland Kitchen.. 1po Q 6 Hrs As Needed Dizzy 3)  Alprazolam 0.5 Mg Tabs (Alprazolam) .Marland Kitchen.. 1 By Mouth Two Times A Day As Needed 4)  Oxycodone Hcl 5 Mg Tabs (Oxycodone Hcl) .Marland Kitchen.. 1po Q 6 Hrs As Needed Pain 5)  Fluoxetine Hcl 20 Mg Caps (Fluoxetine Hcl) .Marland Kitchen.. 1 By Mouth Once Daily 6)  Tessalon Perles 100 Mg Caps (Benzonatate) .Marland Kitchen.. 1-2 By Mouth Three Times A Day As Needed Cough 7)  Symbicort 160-4.5 Mcg/act Aero (Budesonide-Formoterol Fumarate) .... 2 Puffs Two Times A Day 8)  Proair Hfa 108 (90 Base) Mcg/act Aers (Albuterol Sulfate) .... 2 Puffs Four Times Per Day As Needed 9)  Fluconazole 150 Mg Tabs (Fluconazole) .Marland Kitchen.. 1po Every Other Day As Needed Yeast Infection  Allergies (verified): 1)  ! Pcn 2)  ! Percocet  Past History:  Past medical, surgical, family and social histories (including risk factors) reviewed, and no changes noted (except as noted below).  Past Medical History: Reviewed history from 05/15/2010 and no changes required. Morbid Obesity Glucose Intolerance Peptic ulcer disease GERD Migraine Anxiety Anemia-iron deficiency glucose intolerance Hyperlipidemia menorrhoagia PNA episode july 2011  GYN:  Dr Rosemary Holms  Past  Surgical History: Reviewed history from 05/05/2010 and no changes required. s/p uterine ablation  April 21, 2010  Family History: Reviewed history from 05/21/2010 and no changes required. DM HTN  allergies: mother heart disease: mother (m.i.) , father cancer: maternal aunts ( ovarian, lung, breast, bone)   Social History: Reviewed history from 05/21/2010 and no changes required. pt is single and lives with Dalbert Batman pt has no children. pt works as a Human resources officer. former smoker.  started at age 89.  less than 1 ppd.  quit July 2011. Alcohol use-yes Drug use-no  Review of Systems       The patient complains of shortness of breath with activity, productive cough, and non-productive cough.  The patient denies shortness of breath at rest, coughing up blood, chest pain, irregular heartbeats, acid heartburn, indigestion, loss of appetite, weight change, abdominal pain, difficulty swallowing, sore throat, tooth/dental problems, headaches, nasal congestion/difficulty breathing through nose, sneezing, itching, ear ache, anxiety, depression, hand/feet swelling, joint stiffness or pain, rash, change in color of mucus, and fever.    Vital Signs:  Patient profile:   35 year old female Height:      67 inches Weight:      308.50 pounds BMI:     48.49 O2 Sat:      99 % on Room air Temp:     97.7 degrees F oral Pulse rate:   86 / minute BP sitting:   100 / 58  (left arm) Cuff size:  large  Vitals Entered By: Arman Filter LPN (June 08, 2010 9:13 AM)  O2 Flow:  Room air CC: Pt is here for a f/u appt to discuss bronch results.  Pt states still sob with exertion and mostly non-productive cough but will occ cough up white to green colored sputum.   Comments Medications reviewed with patient Arman Filter LPN  June 08, 2010 9:13 AM    Physical Exam  General:  obese female in nad Nose:  no drainage or purulence Lungs:  a few scattered crackles, o/w clear Heart:   rrr Extremities:  no significant edema or cyanosis  Neurologic:  alert and oriented, moves all 4.   Impression & Recommendations:  Problem # 1:  PULMONARY SARCOIDOSIS (ICD-135) The pt has noncaseating granulomas on her lung biopsy which when combined with her history, is most suggestive of sarcoid.  I cannot 100% rule out HP given the biopsy description and large numbers of lymphocytes on BAL, but I think this is not likely.  I have had a long discussion with the pt about sarcoid, including its pathophysiology and various treatments.  She is clearly very symptomatic, and therefore will treat her with a course of steroids that I hope will be short.  She will also need full pfts once she reaches a stable baseline.  Medications Added to Medication List This Visit: 1)  Prednisone 10 Mg Tabs (Prednisone) .... Take as directed 2)  Prednisone 10 Mg Tabs (Prednisone) .... 4 each day for next 3 weeks, then decrease to 3 each day until next visit with me.  Other Orders: Est. Patient Level IV ([address])  Patient Instructions: 1)  will start on prednisone 40mg  a day for next 3 weeks.  Then decrease to 30mg  a day until next visit. 2)  make apptm with ophthamologist to examine your eyes, and make sure they know you have sarcoid. 3)  followup with me in 4 weeks, or sooner if problems.  Will check cxr next visit.   Prescriptions: PREDNISONE 10 MG  TABS (PREDNISONE) 4 each day for next 3 weeks, then decrease to 3 each day until next visit with me.  #100 x 1   Entered and Authorized by:   Barbaraann Share MD   Signed by:   Barbaraann Share MD on 06/08/2010   Method used:   Print then Give to Patient   RxID:   [id number]   Appended Document: ov to discuss bronch results/mg discussed path with Dr Dierdre Searles.  The granulomas are very well formed, are not in the alveoli, and appear most c/w sarcoid rather than HP.

## 2010-11-24 NOTE — Miscellaneous (Signed)
Summary: Orders Update  Clinical Lists Changes  Orders: Added new Referral order of Pulmonary Referral (Pulmonary) - Signed 

## 2010-11-24 NOTE — Assessment & Plan Note (Signed)
Summary: ACHES/ NO FEVER/NWS   Vital Signs:  Patient profile:   35 year old female Height:      67 inches Weight:      296.25 pounds BMI:     46.57 O2 Sat:      96 % on Room air Temp:     98.5 degrees F oral Pulse rate:   96 / minute BP sitting:   110 / 72  (left arm) Cuff size:   large  Vitals Entered By: Zella Ball Ewing CMA Duncan Dull) (September 14, 2010 4:04 PM)  O2 Flow:  Room air CC: Body Aches, depression/RE   Primary Care Provider:  [name] MD  CC:  Body Aches and depression/RE.  History of Present Illness: here to f/u;  under more stress lately as recently step sister died realtively suddently with breast cancer, as well as much more anixety with recent reading about sarcoidosis on the internet and all the possible end organ damage;  saw dr clance with bronchtis per pt last wk, finished the avelox and has small non prod cough residual, but Pt denies CP, worsening sob, doe, wheezing, orthopnea, pnd, worsening LE edema, palps, dizziness or syncope Pt denies new neuro symptoms such as headache, facial or extremity weakness Pt denies polydipsia, polyuria, or low sugar symptoms such as shakiness improved with eating.  Overall good compliance with meds, trying to follow low chol, DM diet, wt stable, little excercise however CBG's in the lower 100s on current meds  Unfortunately today has what appears to be new issue of  significant polyarthralgias/arthritis -  hand/wrists and ankles primarily, with 2-3 wk increased marked Increaesd AM stiffness and pain, better with running hands in hot water, and improved after 1 hr. still with swelling to LLE (I think prob some venous insufficiency)but  has tender LLE to the swollen diffuse area of the ankle Left > right  also mentions increased depressive symtpoms in the past month with lower mood, high anxiety, anhedonia, wt increase, sleeping more, less motivated on the job though not in trouble and not missed work;  not looking forward or enjoying  anymore;  Denies  suicidal ideation, or panic.   Also no overt bruising or bleeding.   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) 15)  Vaginitis, Candidal  (ICD-112.1) 16)  Diarrhea, Acute  (ICD-787.91) 17)  Menorrhagia  (ICD-626.2) 18)  Pneumonia  (ICD-486) 19)  Abdominal Pain, Lower  (ICD-789.09) 20)  Fever Unspecified  (ICD-780.60) 21)  Otitis Media, Acute, Bilateral  (ICD-382.9) 22)  Hyperlipidemia  (ICD-272.4) 23)  Glucose Intolerance  (ICD-271.3) 24)  Anemia-iron Deficiency  (ICD-280.9) 25)  Anxiety  (ICD-300.00) 26)  Smoker  (ICD-305.1) 27)  Wheezing  (ICD-786.07) 28)  Migraine Headache  (ICD-346.90) 29)  Gerd  (ICD-530.81) 30)  Peptic Ulcer Disease  (ICD-533.90) 31)  Glucose Intolerance  (ICD-271.3) 32)  Morbid Obesity  (ICD-278.01)  Medications Prior to Update: 1)  Omeprazole 20 Mg Cpdr (Omeprazole) .... 2 By Mouth Once Daily 2)  Meclizine Hcl 12.5  Mg Tabs (Meclizine Hcl) .Marland Kitchen.. 1po Q 6 Hrs As Needed Dizzy 3)  Alprazolam 0.5 Mg Tabs (Alprazolam) .Marland Kitchen.. 1 By Mouth Two Times A Day As Needed 4)  Fluoxetine Hcl 20 Mg Caps (Fluoxetine Hcl) .Marland Kitchen.. 1 By Mouth Once Daily 5)  Symbicort 160-4.5 Mcg/act Aero (Budesonide-Formoterol Fumarate) .... 2 Puffs Two Times A Day 6)  Proair Hfa 108 (90 Base) Mcg/act Aers (Albuterol Sulfate) .... 2 Puffs Four Times Per Day As Needed 7)  Zolpidem Tartrate 12.5 Mg Cr-Tabs (Zolpidem Tartrate) .Marland Kitchen.. 1 By Mouth At Bedtime As  Needed 8)  Onetouch Test  Strp (Glucose Blood) .... Use Asd Four Times Per Day 9)  Lancets  Misc (Lancets) .... Use Asd Four Times Per Day 10)  Janumet 50-500 Mg Tabs (Sitagliptin-Metformin Hcl) .Marland Kitchen.. 1poi Two Times A Day 11)  Ferrous Sulfate 325 (65 Fe) Mg Tbec (Ferrous Sulfate) .Marland Kitchen.. 1po Two Times A Day 12)  Furosemide 20 Mg Tabs (Furosemide) .... Take 1 Tab By Mouth Every Day 13)  Avelox 400 Mg  Tabs (Moxifloxacin Hcl) .... By Mouth Daily  Current Medications (verified): 1)  Omeprazole 20 Mg Cpdr (Omeprazole) .... 2 By Mouth Once Daily 2)  Meclizine Hcl 12.5 Mg Tabs (Meclizine Hcl) .Marland Kitchen.. 1po Q 6 Hrs As Needed Dizzy 3)  Alprazolam 0.5 Mg Tabs (Alprazolam) .Marland Kitchen.. 1 By Mouth Two Times A Day As Needed 4)  Fluoxetine Hcl 40 Mg Caps (Fluoxetine Hcl) .Marland Kitchen.. 1 By Mouth Once Daily 5)  Symbicort 160-4.5 Mcg/act Aero (Budesonide-Formoterol Fumarate) .... 2 Puffs Two Times A Day 6)  Proair Hfa 108 (90 Base) Mcg/act Aers (Albuterol Sulfate) .... 2 Puffs Four Times Per Day As Needed 7)  Zolpidem Tartrate 12.5 Mg Cr-Tabs (Zolpidem Tartrate) .Marland Kitchen.. 1 By Mouth At Bedtime As Needed 8)  Onetouch Test  Strp (Glucose Blood) .... Use Asd Four Times Per Day 9)  Lancets  Misc (Lancets) .... Use Asd Four Times Per Day 10)  Janumet 50-500 Mg Tabs (Sitagliptin-Metformin Hcl) .Marland Kitchen.. 1poi Two Times A Day 11)  Ferrous Sulfate 325 (65 Fe) Mg Tbec (Ferrous Sulfate) .Marland Kitchen.. 1po Two Times A Day 12)  Furosemide 20 Mg Tabs (Furosemide) .... Take 1 Tab By Mouth Every Day 13)  Tylenol Arthritis Pain 650 Mg Cr-Tabs (Acetaminophen) .Marland Kitchen.. 1-2 By Mouth Three Times A Day As Needed  Allergies (verified): 1)  ! Pcn 2)  ! Percocet  Past History:  Past Surgical History: Last updated: 05/05/2010 s/p uterine ablation  April 21, 2010  Social History: Last updated: 07/31/2010 pt is single and lives with Dalbert Batman - also pt of [facility] pt has no children. pt works as a Human resources officer. former smoker.  started at age 56.  less than 1 ppd.  quit  July 2011. Alcohol use-yes Drug use-no  Risk Factors: Smoking Status: current (12/11/2009)  Past Medical History: Morbid Obesity Glucose Intolerance Peptic ulcer disease GERD Migraine Anxiety Anemia-iron deficiency glucose intolerance Hyperlipidemia menorrhoagia PNA episode july 2011 GYN:  Dr Rosemary Holms Diabetes mellitus, type II - steroid related Depression  Family History: DM HTN  allergies: mother heart disease: mother (m.i.) , father cancer: maternal aunts ( ovarian, lung, breast, bone)  father with RA  Review of Systems       all otherwise negative per pt -    Physical Exam  General:  alert and overweight-appearing.  NAD Head:  normocephalic and atraumatic.   Eyes:  vision grossly intact, pupils equal, and pupils round.   Ears:  R ear normal and L ear normal.  Nose:  no external deformity and no nasal discharge.   Mouth:  pharyngeal erythema and fair dentition.   Neck:  supple and no masses.   Lungs:  normal respiratory effort and normal breath sounds.   Heart:  normal rate and regular rhythm.   Msk:  bilat MCP's with warmth, tender and mild swelling, also tender left ankle with small effusion Extremities:  no edema, no erythema    Impression & Recommendations:  Problem # 1:  BRONCHITIS, ACUTE (ICD-466.0)  The following medications were removed from the medication list:    Avelox 400 Mg Tabs (Moxifloxacin hcl) ..... By mouth daily Her updated medication list for this problem includes:    Symbicort 160-4.5 Mcg/act Aero (Budesonide-formoterol fumarate) .Marland Kitchen... 2 puffs two times a day    Proair Hfa 108 (90 Base) Mcg/act Aers (Albuterol sulfate) .Marland Kitchen... 2 puffs four times per day as needed improved, reassured, no further tx needed or cxr   Problem # 2:  DIABETES MELLITUS, TYPE II (ICD-250.00)  Her updated medication list for this problem includes:    Janumet 50-500 Mg Tabs (Sitagliptin-metformin hcl) .Marland Kitchen... 1poi two times a day  Labs Reviewed: Creat: 0.7  (09/01/2010)    Reviewed HgBA1c results: 9.9 (07/31/2010)  10.8 (07/07/2010) cbg's improved on current med, currently not on steroid which has caused increase in the past;  I expect next a1c to be much improved  Problem # 3:  DEPRESSION (ICD-311)  Her updated medication list for this problem includes:    Alprazolam 0.5 Mg Tabs (Alprazolam) .Marland Kitchen... 1 by mouth two times a day as needed    Fluoxetine Hcl 40 Mg Caps (Fluoxetine hcl) .Marland Kitchen... 1 by mouth once daily for increaesed prozac as above, f/u any worsening s/s.  declines counseling  Discussed treatment options, including trial of antidpressant medication. Patient agrees to call if any worsening of symptoms or thoughts of doing harm arise. Verified that the patient has no suicidal ideation at this time.   Problem # 4:  PAIN IN JOINT, MULTIPLE SITES (ICD-719.49) I suspect RA, but will need lab eval, and refer rheumatology, hopefully wont need prednisone for this, hold for now, for tylenol as needed  Orders: T-ANA Titer and Pattern (25366-44034) TLB-Sedimentation Rate (ESR) (85652-ESR) Rheumatology Referral (Rheumatology)  Complete Medication List: 1)  Omeprazole 20 Mg Cpdr (Omeprazole) .... 2 by mouth once daily 2)  Meclizine Hcl 12.5 Mg Tabs (Meclizine hcl) .Marland Kitchen.. 1po q 6 hrs as needed dizzy 3)  Alprazolam 0.5 Mg Tabs (Alprazolam) .Marland Kitchen.. 1 by mouth two times a day as needed 4)  Fluoxetine Hcl 40 Mg Caps (Fluoxetine hcl) .Marland Kitchen.. 1 by mouth once daily 5)  Symbicort 160-4.5 Mcg/act Aero (Budesonide-formoterol fumarate) .... 2 puffs two times a day 6)  Proair Hfa 108 (90 Base) Mcg/act Aers (Albuterol sulfate) .... 2 puffs four times per day as needed 7)  Zolpidem Tartrate 12.5 Mg Cr-tabs (Zolpidem tartrate) .Marland Kitchen.. 1 by mouth at bedtime as needed 8)  Onetouch Test Strp (Glucose blood) .... Use asd four times per day 9)  Lancets Misc (Lancets) .... Use asd four times per day 10)  Janumet 50-500 Mg Tabs (Sitagliptin-metformin hcl) .Marland Kitchen.. 1poi two times a  day 11)  Ferrous Sulfate 325 (65 Fe) Mg Tbec (Ferrous sulfate) .Marland Kitchen.. 1po two times a day 12)  Furosemide 20 Mg Tabs (Furosemide) .... Take 1 tab by mouth every day 13)  Tylenol Arthritis Pain 650 Mg Cr-tabs (Acetaminophen) .Marland Kitchen.. 1-2 by mouth three times a day as needed  Patient Instructions: 1)  Please go to the Lab in the basement for your blood and/or urine tests today 2)  Please call the number on the [facility] for results of your testing  3)  You will be contacted about the referral(s) to: rheumatology 4)  increase the fluoxetine to 40 mg per day 5)  you can also use the tylenol arthritis up to 2 every 8 hrs for pain  6)  Continue all previous medications as before this visit  7)  Please schedule a follow-up appointment in 3 months with CPX and labs and; 8)  HbgA1C prior to visit, ICD-9: 250.02 9)  Urine Microalbumin prior to visit, ICD-9: Prescriptions: FLUOXETINE HCL 40 MG CAPS (FLUOXETINE HCL) 1 by mouth once daily  #90 x 3   Entered and Authorized by:   [name] MD   Signed by:   [name] MD on 09/14/2010   Method used:   Print then Give to Patient   RxID:   [id number]    Orders Added: 1)  T-ANA Titer and Pattern [01027-25366] 2)  TLB-Sedimentation Rate (ESR) [85652-ESR] 3)  Rheumatology Referral [Rheumatology] 4)  Est. Patient Level IV [44034]

## 2010-11-24 NOTE — Letter (Signed)
Summary: Out of Work  [facility]  [address] [place], [place] [address]   Phone: [phone]  Fax: [phone]    January 16, 2010   Employee:  Nicole Melendez    To Whom It May Concern:   For Medical reasons, please excuse the above named employee from work for the following dates:  Start:   01/16/2010  End:   01/16/2010  If you need additional information, please feel free to contact our office.         Sincerely,    Dr. [name]

## 2010-11-24 NOTE — Letter (Signed)
Summary: Out of Work  [facility]  [address] [place], [place] [address]   Phone: [phone]  Fax: [phone]    May 15, 2010   Employee:  AVERLEIGH SAVARY Gasiorowski    To Whom It May Concern:   For Medical reasons, please excuse the above named employee from work for the following dates:  Start:   05/12/2010  End:   05/19/2010  Return to work May 19, 2010  If you need additional information, please feel free to contact our office.         Sincerely,    Dr. [name]

## 2010-11-24 NOTE — Letter (Signed)
Summary: Physician's order for Venofer/[place]  Physician's order for Venofer/[place]   Imported By: Sherian Rein 08/05/2010 09:13:35  _____________________________________________________________________  External Attachment:    Type:   Image     Comment:   External Document

## 2010-11-24 NOTE — Progress Notes (Signed)
Summary: LABS NEEDED  Phone Note From Other Clinic   Caller: Lanora Manis - Dr Dierdre Forth [phone] Summary of Call: Lanora Manis from Dr Shawnee Knapp office called regarding pt's last labs. Pt was in the office at the time. Gave her verbal results of what is avail per MD's request. Called lab, ANA has not resulted yet - max time should be 10 days, [facility] says it is still processing. called Lanora Manis back and advise she f/u w/pulmonary regarding results when avail.  Initial call taken by: Lamar Sprinkles, CMA,  September 28, 2010 12:47 PM  Follow-up for Phone Call        The ANA was originally ordered by Dr Jonny Ruiz. Wrong test was ordered due to lab error and if MD would like results of ANA pt must come in for re-draw. Advised Dr Felicity Coyer of this prior to pt's office visit today.  Follow-up by: Lamar Sprinkles, CMA,  September 29, 2010 5:27 PM  Additional Follow-up for Phone Call Additional follow up Details #1::        noted Additional Follow-up by: [name] MD,  September 29, 2010 5:46 PM     Appended Document: LABS NEEDED i did not order ANA at [facility] due to other acute issues (hives) and ER f/u needs - pt will see PCP again later this week for review - thanks

## 2010-11-24 NOTE — Progress Notes (Signed)
Summary: Return to work  Phone Note Call from Patient Call back at [employer] [phone]   Caller: Patient VM OK Summary of Call: Pt called requesting letter stating she is okay to return to work Sunday. Initial call taken by: Margaret Pyle, CMA,  July 31, 2010 1:12 PM  Follow-up for Phone Call        ok - to robin to handle  please also inform pt that her HGB is much lower this time, and almost to the point of needing transfusion of blood  She REALLY need to take her iron pill;  also we will refer to Short stay for IV iron - venofer 150 mg q wk x 3 wks  robin to obtain form for short stay to fill out Follow-up by: [name] MD,  July 31, 2010 1:14 PM  Additional Follow-up for Phone Call Additional follow up Details #1::        called pt informed of above information. Completed work Physicist, medical and faxed for patient. Will call [facility] and schedule Venofer IV for patient.    Additional Follow-up for Phone Call Additional follow up Details #2::    patient is scheduled for Venofer IV August 03, 2010 at 10:00am at [facility]. Faxed order and recent office notes to [phone]. Called patient and informed of appointment. Follow-up by: Zella Ball Ewing CMA (AAMA),  July 31, 2010 2:25 PM  New/Updated Medications: VENOFER 20 MG/ML SOLN (IRON SUCROSE) 5 cc IV q wk for 3 wks Prescriptions: VENOFER 20 MG/ML SOLN (IRON SUCROSE) 5 cc IV q wk for 3 wks  #as above x 0   Entered and Authorized by:   [name] MD   Signed by:   [name] MD on 07/31/2010   Method used:   Print then Give to Patient   RxID:   [id number]

## 2010-11-24 NOTE — Assessment & Plan Note (Signed)
Summary: COUGH/ EAR PAIN /NWS   Vital Signs:  Patient profile:   35 year old female Height:      67 inches Weight:      312.13 pounds BMI:     49.06 O2 Sat:      98 % on Room air Temp:     97.9 degrees F oral Pulse rate:   79 / minute BP sitting:   120 / 64  (left arm) Cuff size:   large  Vitals Entered ByZella Ball Ewing (January 16, 2010 2:16 PM)  O2 Flow:  Room air CC: cough, ear pain, dizzy/RE   CC:  cough, ear pain, and dizzy/RE.  History of Present Illness: here to f/u;  chest symtpoms have resolved, and overall felt better the worse again in the past 3 days with worsening bilat ear pain right more than left, with pressure, pain, feverish again, and headache without signfiicant sinus pain, pressure, St, cough , and Pt denies CP, sob, doe, wheezing, orthopnea, pnd, worsening LE edema, palps, dizziness or syncope   Has occas dizziness, hard beating occasoinally of the heart and few sweats.  Denies polyuria and polydipsia, has been trying to follow lower cal diet, no  wt loss yet.    Problems Prior to Update: 1)  Otitis Media, Acute, Bilateral  (ICD-382.9) 2)  Bronchitis-acute  (ICD-466.0) 3)  Hyperlipidemia  (ICD-272.4) 4)  Glucose Intolerance  (ICD-271.3) 5)  Anemia-iron Deficiency  (ICD-280.9) 6)  Anxiety  (ICD-300.00) 7)  Smoker  (ICD-305.1) 8)  Wheezing  (ICD-786.07) 9)  Migraine Headache  (ICD-346.90) 10)  Gerd  (ICD-530.81) 11)  Peptic Ulcer Disease  (ICD-533.90) 12)  Glucose Intolerance  (ICD-271.3) 13)  Morbid Obesity  (ICD-278.01)  Medications Prior to Update: 1)  Prednisone 10 Mg Tabs (Prednisone) .... 3po Qd For 3days, Then 2po Qd For 3days, Then 1po Qd For 3days, Then Stop 2)  Chantix Continuing Month Pak 1 Mg Tabs (Varenicline Tartrate) .... Use Asd 1 By Mouth Once Daily 3)  Azithromycin 250 Mg Tabs (Azithromycin) .... 2po Qd For 1 Day, Then 1po Qd For 4days, Then Stop 4)  Hydrocodone-Homatropine 5-1.5 Mg/70ml Syrp (Hydrocodone-Homatropine) .Marland Kitchen.. 1 Tsp By Mouth  Q 6 Hrs As Needed Cough 5)  Omeprazole 20 Mg Cpdr (Omeprazole) .... 2 By Mouth Once Daily  Current Medications (verified): 1)  Prednisone 10 Mg Tabs (Prednisone) .... 3po Qd For 3days, Then 2po Qd For 3days, Then 1po Qd For 3days, Then Stop 2)  Chantix Continuing Month Pak 1 Mg Tabs (Varenicline Tartrate) .... Use Asd 1 By Mouth Once Daily 3)  Doxycycline Hyclate 100 Mg Caps (Doxycycline Hyclate) .Marland Kitchen.. 1po Two Times A Day 4)  Hydrocodone-Homatropine 5-1.5 Mg/57ml Syrp (Hydrocodone-Homatropine) .Marland Kitchen.. 1 Tsp By Mouth Q 6 Hrs As Needed Cough 5)  Omeprazole 20 Mg Cpdr (Omeprazole) .... 2 By Mouth Once Daily 6)  Meclizine Hcl 12.5 Mg Tabs (Meclizine Hcl) .Marland Kitchen.. 1po Q 6 Hrs As Needed Dizzy  Allergies (verified): 1)  ! Pcn  Past History:  Past Medical History: Last updated: 12/11/2009 Morbid Obesity Glucose Intolerance Peptic ulcer disease GERD Migraine Anxiety Anemia-iron deficiency glucose intolerance Hyperlipidemia  Past Surgical History: Last updated: 12/11/2009 Denies surgical history  Social History: Last updated: 12/11/2009 Married Current Smoker Alcohol use-yes Drug use-no  Risk Factors: Smoking Status: current (12/11/2009)  Review of Systems       all otherwise negative per pt -    Physical Exam  General:  alert and overweight-appearing.  , mild ill  Head:  normocephalic  and atraumatic.   Eyes:  vision grossly intact, pupils equal, and pupils round.   Ears:  bilat tm's marked erythema, right shiny and slight bulging, bilat canals ok Nose:  nasal dischargemucosal pallor and mucosal edema.   Mouth:  pharyngeal erythema and fair dentition.   Neck:  supple and no masses.   Lungs:  normal respiratory effort and normal breath sounds.   Heart:  normal rate and regular rhythm.   Extremities:  no edema, no erythema    Impression & Recommendations:  Problem # 1:  OTITIS MEDIA, ACUTE, BILATERAL (ICD-382.9)  Her updated medication list for this problem includes:     Doxycycline Hyclate 100 Mg Caps (Doxycycline hyclate) .Marland Kitchen... 1po two times a day bilat, serous with significant vertigo - treat as above, f/u any worsening signs or symptoms   Problem # 2:  BRONCHITIS-ACUTE (ICD-466.0)  Her updated medication list for this problem includes:    Doxycycline Hyclate 100 Mg Caps (Doxycycline hyclate) .Marland Kitchen... 1po two times a day    Hydrocodone-homatropine 5-1.5 Mg/70ml Syrp (Hydrocodone-homatropine) .Marland Kitchen... 1 tsp by mouth q 6 hrs as needed cough resolved, cont to follow for any worsening symptoms  Problem # 3:  GLUCOSE INTOLERANCE (ICD-271.3) asympt - ok to follow for onset polys  Complete Medication List: 1)  Prednisone 10 Mg Tabs (Prednisone) .... 3po qd for 3days, then 2po qd for 3days, then 1po qd for 3days, then stop 2)  Chantix Continuing Month Pak 1 Mg Tabs (Varenicline tartrate) .... Use asd 1 by mouth once daily 3)  Doxycycline Hyclate 100 Mg Caps (Doxycycline hyclate) .Marland Kitchen.. 1po two times a day 4)  Hydrocodone-homatropine 5-1.5 Mg/20ml Syrp (Hydrocodone-homatropine) .Marland Kitchen.. 1 tsp by mouth q 6 hrs as needed cough 5)  Omeprazole 20 Mg Cpdr (Omeprazole) .... 2 by mouth once daily 6)  Meclizine Hcl 12.5 Mg Tabs (Meclizine hcl) .Marland Kitchen.. 1po q 6 hrs as needed dizzy  Patient Instructions: 1)  Please take all new medications as prescribed 2)  Continue all previous medications as before this visit  3)  Please schedule a follow-up appointment as needed. 4)  you are given the work note today Prescriptions: MECLIZINE HCL 12.5 MG TABS (MECLIZINE HCL) 1po q 6 hrs as needed dizzy  #50 x 1   Entered and Authorized by:   [name] MD   Signed by:   [name] MD on 01/16/2010   Method used:   Print then Give to Patient   RxID:   [id number] DOXYCYCLINE HYCLATE 100 MG CAPS (DOXYCYCLINE HYCLATE) 1po two times a day  #20 x 0   Entered and Authorized by:   [name] MD   Signed by:   [name] MD on 01/16/2010   Method used:   Print then Give to Patient   RxID:    [id number]

## 2010-11-24 NOTE — Letter (Signed)
Summary: Out of Work  [facility]  [address] [place], [place] [address]   Phone: [phone]  Fax: [phone]    December 11, 2009   Employee:  Nicole Melendez    To Whom It May Concern:   For Medical reasons, please excuse the above named employee from work for the following dates:  Start:   Dec 09, 2009  End:   Dec 14, 2009   --   to return to work without restriction Dec 15, 2009  If you need additional information, please feel free to contact our office.         Sincerely,    [name] MD

## 2010-11-24 NOTE — Assessment & Plan Note (Signed)
Summary: rov for sarcoid   Copy to:  [name] Primary Provider/Referring Provider:  [name] MD  CC:  4 week follow up. Pt states the prednisone caused her to have DM and withdrawls. Pt states she went to the eye dr and was told her vision was so bad. pt states she has cough with yellow phlem.Nicole Melendez  History of Present Illness: the pt comes in today for f/u of her recently diagnosed sarcoidosis.  She was started on prednisone at the last visit, however the pt had poor tolerance with hyperglycemia and malaise.  Unfortunately, she stopped suddenly on her own without consulting with me.  She did not see a big difference in her breathing, and her cxr today is really without much change.  Current Medications (verified): 1)  Omeprazole 20 Mg Cpdr (Omeprazole) .... 2 By Mouth Once Daily 2)  Meclizine Hcl 12.5 Mg Tabs (Meclizine Hcl) .Nicole Melendez.. 1po Q 6 Hrs As Needed Dizzy 3)  Alprazolam 0.5 Mg Tabs (Alprazolam) .Nicole Melendez.. 1 By Mouth Two Times A Day As Needed 4)  Fluoxetine Hcl 20 Mg Caps (Fluoxetine Hcl) .Nicole Melendez.. 1 By Mouth Once Daily 5)  Tessalon Perles 100 Mg Caps (Benzonatate) .Nicole Melendez.. 1-2 By Mouth Three Times A Day As Needed Cough 6)  Symbicort 160-4.5 Mcg/act Aero (Budesonide-Formoterol Fumarate) .... 2 Puffs Two Times A Day 7)  Proair Hfa 108 (90 Base) Mcg/act Aers (Albuterol Sulfate) .... 2 Puffs Four Times Per Day As Needed 8)  Fluconazole 150 Mg Tabs (Fluconazole) .Nicole Melendez.. 1po Every Other Day As Needed Yeast Infection 9)  Zolpidem Tartrate 12.5 Mg Cr-Tabs (Zolpidem Tartrate) .Nicole Melendez.. 1 By Mouth At Bedtime As Needed 10)  Fioricet 50-325-40 Mg Tabs (Butalbital-Apap-Caffeine) .Nicole Melendez.. 1po Once Daily As Needed 11)  Onetouch Test  Strp (Glucose Blood) .... Use Asd Four Times Per Day 12)  Lancets  Misc (Lancets) .... Use Asd Four Times Per Day 13)  Novolog Flexpen 100 Unit/ml Soln (Insulin Aspart) .... Use Asd Sliding Scale 14)  Novolog Flexpen Needles .... Use Asd Four Times Per Day As Needed Sliding Scale 15)  Janumet  50-500 Mg Tabs (Sitagliptin-Metformin Hcl) .Nicole Melendez.. 1poi Two Times A Day  Allergies (verified): 1)  ! Pcn 2)  ! Percocet  Past History:  Past medical, surgical, family and social histories (including risk factors) reviewed, and no changes noted (except as noted below).  Past Medical History: Reviewed history from 05/15/2010 and no changes required. Morbid Obesity Glucose Intolerance Peptic ulcer disease GERD Migraine Anxiety Anemia-iron deficiency glucose intolerance Hyperlipidemia menorrhoagia PNA episode july 2011  GYN:  Dr Rosemary Holms  Past Surgical History: Reviewed history from 05/05/2010 and no changes required. s/p uterine ablation  April 21, 2010 PMH-FH-SH reviewed-no changes except otherwise noted  Family History: Reviewed history from 05/21/2010 and no changes required. DM HTN  allergies: mother heart disease: mother (m.i.) , father cancer: maternal aunts ( ovarian, lung, breast, bone)   Social History: Reviewed history from 05/21/2010 and no changes required. pt is single and lives with Dalbert Batman pt has no children. pt works as a Human resources officer. former smoker.  started at age 54.  less than 1 ppd.  quit July 2011. Alcohol use-yes Drug use-no  Review of Systems       The patient complains of productive cough, acid heartburn, indigestion, loss of appetite, weight change, sneezing, and anxiety.  The patient denies shortness of breath with activity, shortness of breath at rest, non-productive cough, coughing up blood, chest pain, irregular heartbeats, abdominal pain, difficulty swallowing, sore  throat, tooth/dental problems, headaches, nasal congestion/difficulty breathing through nose, itching, ear ache, depression, hand/feet swelling, joint stiffness or pain, rash, change in color of mucus, and fever.    Vital Signs:  Patient profile:   35 year old female Height:      67 inches Weight:      302.50 pounds O2 Sat:      99 % on Room air Temp:     98.3 degrees  F oral Pulse rate:   91 / minute BP sitting:   120 / 70  (left arm) Cuff size:   large  Vitals Entered By: Carver Fila (July 10, 2010 9:56 AM)  O2 Flow:  Room air CC: 4 week follow up. Pt states the prednisone caused her to have DM and withdrawls. Pt states she went to the eye dr and was told her vision was so bad. pt states she has cough with yellow phlem. Comments meds and allergies updated Phone number updated Carver Fila  July 10, 2010 9:54 AM    Physical Exam  General:  obese female in nad Nose:  no drainage or purulence Mouth:  no thrush, exudates, or lesions. Lungs:  a few scattered crackles, no wheezing or rhonchi Heart:  rrr,no mrg Extremities:  no edema or cyanosis  Neurologic:  alert and oriented, moves all 4.   Impression & Recommendations:  Problem # 1:  PULMONARY SARCOIDOSIS (ICD-135) the pt had significant issues with prednisone treatment, including severe hyperglycemia, malaise, and really did not see a big difference in her breathing.  At this point, I am not sure the risk/benefit ratio favors continuing treatment (especially since no change in her cxr).  I have explained to her that she cannot just stop the medication, and will arrange for a taper over 6 days.  I have asked her to continue with her symbicort to help with airway symptoms, and to work on weight loss and exercise program.  If she continues to have symptoms that are significantly impacting her QOL, would consider whether to start MTX.  I have discussed this with her as well.    Medications Added to Medication List This Visit: 1)  Prednisone 10 Mg Tabs (Prednisone) .... Take as directed  Other Orders: Est. Patient Level IV (32440)  Patient Instructions: 1)  we need to taper you off prednisone...can't stop suddenly.  Would take 20mg  a day for 2 days, then 15mg  a day for 2 days, then 10mg  a day for 2 days, then stop. 2)  stay on symbicort, keep mouth rinsed.  If your breathing worsens to the  point that it is interfering significantly with your quality of life, can consider more aggressive treatment that does not include prednisone. 3)  followup with me in 2mos.  Prescriptions: PREDNISONE 10 MG  TABS (PREDNISONE) Take as directed  #10 x 0   Entered and Authorized by:   Barbaraann Share MD   Signed by:   Barbaraann Share MD on 07/10/2010   Method used:   Print then Give to Patient   RxID:   [id number]

## 2010-11-24 NOTE — Assessment & Plan Note (Signed)
Summary: fever/cough,earache/cd - told pt to come to side door/cd   Vital Signs:  Patient profile:   35 year old female Height:      67.5 inches Weight:      316.25 pounds BMI:     48.98 O2 Sat:      97 % on Room air Temp:     98.3 degrees F oral Pulse rate:   95 / minute BP sitting:   140 / 76  (left arm)  Vitals Entered By: Lucious Groves (January 02, 2010 3:47 PM)  O2 Flow:  Room air CC: C/O fever, cough, and earache x5 days. Pt is also having ulcer issues./kb Is Patient Diabetic? No Pain Assessment Patient in pain? no        CC:  C/O fever, cough, and and earache x5 days. Pt is also having ulcer issues./kb.  History of Present Illness: here with acute onset mild to mod symtpoms for 3 days fever, ST, headache, malaise and general weakness, and today with worsening prod cough and mild wheezing, sob;  Pt denies CP, orthopnea, pnd, worsening LE edema, palps, dizziness or syncope .  Pt denies new neuro symptoms such as headache, facial or extremity weakness   Has bilat ear fullness but no hearing loss or vertigo or n/v.  Does have some mild reflux symptoms despite diet changes, without dysphagia, wt loss, abd pain or bowel changes, or blood.    Preventive Screening-Counseling & Management      Drug Use:  no.    Problems Prior to Update: 1)  Bronchitis-acute  (ICD-466.0) 2)  Hyperlipidemia  (ICD-272.4) 3)  Glucose Intolerance  (ICD-271.3) 4)  Anemia-iron Deficiency  (ICD-280.9) 5)  Anxiety  (ICD-300.00) 6)  Smoker  (ICD-305.1) 7)  Wheezing  (ICD-786.07) 8)  Migraine Headache  (ICD-346.90) 9)  Gerd  (ICD-530.81) 10)  Peptic Ulcer Disease  (ICD-533.90) 11)  Glucose Intolerance  (ICD-271.3) 12)  Morbid Obesity  (ICD-278.01)  Medications Prior to Update: 1)  Promethazine Hcl 25 Mg Tabs (Promethazine Hcl) .Marland Kitchen.. 1 By Mouth Q 6 Hrs As Needed Nausea 2)  Lomotil 2.5-0.025 Mg Tabs (Diphenoxylate-Atropine) .Marland Kitchen.. 1 By Mouth As Needed Loose Stool (Max 8 Tabs Per 24hrs) 3)  Prednisone 10  Mg Tabs (Prednisone) .... 3po Qd For 3days, Then 2po Qd For 3days, Then 1po Qd For 3days, Then Stop 4)  Chantix Continuing Month Pak 1 Mg Tabs (Varenicline Tartrate) .... Use Asd 1 By Mouth Once Daily  Current Medications (verified): 1)  Prednisone 10 Mg Tabs (Prednisone) .... 3po Qd For 3days, Then 2po Qd For 3days, Then 1po Qd For 3days, Then Stop 2)  Chantix Continuing Month Pak 1 Mg Tabs (Varenicline Tartrate) .... Use Asd 1 By Mouth Once Daily 3)  Azithromycin 250 Mg Tabs (Azithromycin) .... 2po Qd For 1 Day, Then 1po Qd For 4days, Then Stop 4)  Hydrocodone-Homatropine 5-1.5 Mg/67ml Syrp (Hydrocodone-Homatropine) .Marland Kitchen.. 1 Tsp By Mouth Q 6 Hrs As Needed Cough 5)  Omeprazole 20 Mg Cpdr (Omeprazole) .... 2 By Mouth Once Daily  Allergies (verified): 1)  ! Pcn  Past History:  Past Medical History: Last updated: 12/11/2009 Morbid Obesity Glucose Intolerance Peptic ulcer disease GERD Migraine Anxiety Anemia-iron deficiency glucose intolerance Hyperlipidemia  Past Surgical History: Last updated: 12/11/2009 Denies surgical history  Social History: Last updated: 12/11/2009 Married Current Smoker Alcohol use-yes Drug use-no  Risk Factors: Smoking Status: current (12/11/2009)  Review of Systems       all otherwise negative per pt -    Physical  Exam  General:  alert and overweight-appearing.  , mild ill  Head:  normocephalic and atraumatic.   Eyes:  vision grossly intact, pupils equal, and pupils round.   Ears:  bilat tm's red, sinus nontender Nose:  nasal dischargemucosal pallor and mucosal edema.   Mouth:  pharyngeal erythema and fair dentition.   Neck:  supple and no masses.   Lungs:  normal respiratory effort, R decreased breath sounds, R wheezes, L decreased breath sounds, and L wheezes.   Heart:  normal rate and regular rhythm.   Abdomen:  soft, non-tender, and normal bowel sounds.   Extremities:  no edema, no erythema    Impression & Recommendations:  Problem  # 1:  BRONCHITIS-ACUTE (ICD-466.0)  Her updated medication list for this problem includes:    Azithromycin 250 Mg Tabs (Azithromycin) .Marland Kitchen... 2po qd for 1 day, then 1po qd for 4days, then stop    Hydrocodone-homatropine 5-1.5 Mg/30ml Syrp (Hydrocodone-homatropine) .Marland Kitchen... 1 tsp by mouth q 6 hrs as needed cough  Orders: Admin of Therapeutic Inj  intramuscular or subcutaneous (30160) Depo- Medrol 40mg  (J1030) Depo- Medrol 80mg  (J1040) treat as above, f/u any worsening signs or symptoms   Problem # 2:  WHEEZING (ICD-786.07) for depo shot today;  for prednisone burst and taper off , most likely due to above   Problem # 3:  GERD (ICD-530.81)  uncomplicated - for PPI tx as otc no longer working, treat as above, f/u any worsening signs or symptoms   Her updated medication list for this problem includes:    Omeprazole 20 Mg Cpdr (Omeprazole) .Marland Kitchen... 2 by mouth once daily  Problem # 4:  GLUCOSE INTOLERANCE (ICD-271.3) asympt - to cal or return for onset polydipsia or polyuria or cbg > 200 on prednisone  Complete Medication List: 1)  Prednisone 10 Mg Tabs (Prednisone) .... 3po qd for 3days, then 2po qd for 3days, then 1po qd for 3days, then stop 2)  Chantix Continuing Month Pak 1 Mg Tabs (Varenicline tartrate) .... Use asd 1 by mouth once daily 3)  Azithromycin 250 Mg Tabs (Azithromycin) .... 2po qd for 1 day, then 1po qd for 4days, then stop 4)  Hydrocodone-homatropine 5-1.5 Mg/70ml Syrp (Hydrocodone-homatropine) .Marland Kitchen.. 1 tsp by mouth q 6 hrs as needed cough 5)  Omeprazole 20 Mg Cpdr (Omeprazole) .... 2 by mouth once daily  Patient Instructions: 1)  you had the steroid shot today 2)  Please take all new medications as prescribed 3)  Continue all previous medications as before this visit  4)  you are given the work note today 5)  Please schedule a follow-up appointment as needed. Prescriptions: OMEPRAZOLE 20 MG CPDR (OMEPRAZOLE) 2 by mouth once daily  #60 x 11   Entered and Authorized by:   [name] MD   Signed by:   [name] MD on 01/02/2010   Method used:   Print then Give to Patient   RxID:   [id number] HYDROCODONE-HOMATROPINE 5-1.5 MG/5ML SYRP (HYDROCODONE-HOMATROPINE) 1 tsp by mouth q 6 hrs as needed cough  #6 oz x 1   Entered and Authorized by:   [name] MD   Signed by:   [name] MD on 01/02/2010   Method used:   Print then Give to Patient   RxID:   [id number] AZITHROMYCIN 250 MG TABS (AZITHROMYCIN) 2po qd for 1 day, then 1po qd for 4days, then stop  #6 x 1   Entered and Authorized by:   [name]  MD   Signed by:   [name] MD on 01/02/2010   Method used:   Print then Give to Patient   RxID:   [id number] PREDNISONE 10 MG TABS (PREDNISONE) 3po qd for 3days, then 2po qd for 3days, then 1po qd for 3days, then stop  #18 x 0   Entered and Authorized by:   [name] MD   Signed by:   [name] MD on 01/02/2010   Method used:   Print then Give to Patient   RxID:   [id number]    Medication Administration  Injection # 1:    Medication: Depo- Medrol 80mg     Diagnosis: BRONCHITIS-ACUTE (ICD-466.0)    Route: IM    Site: LUOQ gluteus    Exp Date: 06/2012    Lot #: Franchot Heidelberg    Mfr: Pharmacia    Patient tolerated injection without complications    Given by: Lucious Groves (January 02, 2010 4:24 PM)  Injection # 2:    Medication: Depo- Medrol 40mg     Diagnosis: BRONCHITIS-ACUTE (ICD-466.0)    Route: IM    Site: LUOQ gluteus    Exp Date: 06/2012    Lot #: Franchot Heidelberg    Patient tolerated injection without complications    Given by: Lucious Groves (January 02, 2010 4:25 PM)  Orders Added: 1)  Admin of Therapeutic Inj  intramuscular or subcutaneous [96372] 2)  Depo- Medrol 40mg  [J1030] 3)  Depo- Medrol 80mg  [J1040] 4)  Est. Patient Level I [84696] 5)  Est. Patient Level IV [29528]

## 2010-11-24 NOTE — Progress Notes (Signed)
  Phone Note Outgoing Call   Call placed by: Robin Call placed to: Patient Summary of Call: Called patient to request to return to office this afternoon as lab results came in and Glucose of 621. Dr. Jonny Ruiz request pt. to return to treat this. Patient stated she would be here asap. Initial call taken by: Robin Ewing CMA Duncan Dull),  July 07, 2010 3:51 PM     Appended Document:  pt seen post visit:   labs reviewed with pt;  gave 12 units subq novolog (fast acting regular) in the office ;  and samples/and rx for janumet 50/500 to start 1 by mouth two times a day;  also gave one syringe of novolog for SSI with instructions, and also glucometer with rx for supplies  pt states roomate well versed on glucometer use and insulin admin  pt states may need to re-start the prednisone - pending further eval later this wk per pulm  will give work note for today and tomorrow

## 2010-11-24 NOTE — Letter (Signed)
Summary: Out of Work  [facility]  [address] [place], [place] [address]   Phone: [phone]  Fax: [phone]    September 30, 2010   Employee:  ROBERT SUNGA [place]    To Whom It May Concern:   For Medical reasons, please excuse the above named employee from work for the following dates:  Start:   Oct 02, 2010  End:   Oct 04, 2010   -   to return to work Oct 05, 2010  If you need additional information, please feel free to contact our office.         Sincerely,    [name] MD

## 2010-11-24 NOTE — Letter (Signed)
Summary: Out of Work  [facility]  [address] [place], [place] [address]   Phone: [phone]  Fax: [phone]    July 07, 2010   Employee:  Nicole Melendez    To Whom It May Concern:   For Medical reasons, please excuse the above named employee from work for the following dates:  Start:   sept 13, 2011  End:   sept 14, 2011    -  to return to work sept 15, 2011 without restrictions  If you need additional information, please feel free to contact our office.         Sincerely,    [name] MD

## 2010-11-24 NOTE — Assessment & Plan Note (Signed)
Summary: abd,back cramps/cd   Vital Signs:  Patient profile:   35 year old female Height:      67 inches Weight:      305.75 pounds BMI:     48.06 O2 Sat:      97 % on Room air Temp:     98.3 degrees F oral Pulse rate:   106 / minute BP sitting:   112 / 68  (left arm) Cuff size:   large  Vitals Entered By: Zella Ball Ewing CMA Duncan Dull) (May 05, 2010 11:49 AM)  O2 Flow:  Room air CC: abdominal and back cramps/RE   CC:  abdominal and back cramps/RE.  History of Present Illness: here s/p recent uterine ablation june 28;  was doing well until 1-2 days increased lower abd pain, gradually worsening now 7/10; seems located mid lower abd, with some radiation to the lower back lower lumbar level, but no back area tenderness, and pain only worse to the ant mid abd with bending;  has had low grade temp , no chills, some nausea but able to take by mouth,  denies bowel change (last BM today without blood or unusual other character), but may have had mild urinary discomfort on urination with ? increased freq, no urgency and no blood.  No vomiting, headache, rash, joint pains and Pt denies CP, sob, doe, wheezing, orthopnea, pnd, worsening LE edema, palps, dizziness or syncope  Pt denies new neuro symptoms such as headache, facial or extremity weakness No intercourse since juen 289.  No hx of PID.  Denies polydipsia, polyuria.    Problems Prior to Update: 1)  Abdominal Pain, Lower  (ICD-789.09) 2)  Fever Unspecified  (ICD-780.60) 3)  Otitis Media, Acute, Bilateral  (ICD-382.9) 4)  Hyperlipidemia  (ICD-272.4) 5)  Glucose Intolerance  (ICD-271.3) 6)  Anemia-iron Deficiency  (ICD-280.9) 7)  Anxiety  (ICD-300.00) 8)  Smoker  (ICD-305.1) 9)  Wheezing  (ICD-786.07) 10)  Migraine Headache  (ICD-346.90) 11)  Gerd  (ICD-530.81) 12)  Peptic Ulcer Disease  (ICD-533.90) 13)  Glucose Intolerance  (ICD-271.3) 14)  Morbid Obesity  (ICD-278.01)  Medications Prior to Update: 1)  Prednisone 10 Mg Tabs  (Prednisone) .... 3po Qd For 3days, Then 2po Qd For 3days, Then 1po Qd For 3days, Then Stop 2)  Chantix Continuing Month Pak 1 Mg Tabs (Varenicline Tartrate) .... Use Asd 1 By Mouth Once Daily 3)  Azithromycin 250 Mg Tabs (Azithromycin) .... 2po Qd For 1 Day, Then 1po Qd For 4days, Then Stop 4)  Hydrocodone-Homatropine 5-1.5 Mg/44ml Syrp (Hydrocodone-Homatropine) .Marland Kitchen.. 1 Tsp By Mouth Q 6 Hrs As Needed Cough 5)  Omeprazole 20 Mg Cpdr (Omeprazole) .... 2 By Mouth Once Daily 6)  Meclizine Hcl 12.5 Mg Tabs (Meclizine Hcl) .Marland Kitchen.. 1po Q 6 Hrs As Needed Dizzy 7)  Alprazolam 0.25 Mg Tabs (Alprazolam) .... 1/2 - 1 By Mouth Once Daily As Needed  Current Medications (verified): 1)  Omeprazole 20 Mg Cpdr (Omeprazole) .... 2 By Mouth Once Daily 2)  Meclizine Hcl 12.5 Mg Tabs (Meclizine Hcl) .Marland Kitchen.. 1po Q 6 Hrs As Needed Dizzy 3)  Alprazolam 0.25 Mg Tabs (Alprazolam) .... 1/2 - 1 By Mouth Once Daily As Needed 4)  Levaquin 500 Mg Tabs (Levofloxacin) .Marland Kitchen.. 1po Once Daily 5)  Oxycodone Hcl 5 Mg Tabs (Oxycodone Hcl) .Marland Kitchen.. 1po Q 6 Hrs As Needed Pain  Allergies (verified): 1)  ! Pcn  Past History:  Past Medical History: Last updated: 12/11/2009 Morbid Obesity Glucose Intolerance Peptic ulcer disease GERD Migraine Anxiety Anemia-iron  deficiency glucose intolerance Hyperlipidemia  Social History: Last updated: 12/11/2009 Married Current Smoker Alcohol use-yes Drug use-no  Risk Factors: Smoking Status: current (12/11/2009)  Past Surgical History: s/p uterine ablation  April 21, 2010  Review of Systems       all otherwise negative per pt -    Physical Exam  General:  alert and overweight-appearing.   Head:  normocephalic and atraumatic.   Eyes:  vision grossly intact, pupils equal, and pupils round.   Ears:  R ear normal and L ear normal.   Nose:  no external deformity and no nasal discharge.   Mouth:  no gingival abnormalities and pharynx pink and moist.   Neck:  supple and no masses.     Lungs:  normal respiratory effort and normal breath sounds.   Heart:  normal rate and regular rhythm.   Extremities:  no edema, no erythema    Impression & Recommendations:  Problem # 1:  FEVER UNSPECIFIED (ICD-780.60) most likely assoc with below; treat adn eval as below, f/u any worsening signs or symptoms   Problem # 2:  ABDOMINAL PAIN, LOWER (ICD-789.09)  diff  to include endometritis, cystitis, or less likely diverticulitis  or other;  treat as below, f/u any worsening signs or symptoms . tx with empriric levaquiin, consider CT r/o abscess, or adding flagyl;  doubt PID at this time  Orders: T-Culture, Urine (87564-33295) TLB-Udip w/ Micro (81001-URINE)  Her updated medication list for this problem includes:    Oxycodone Hcl 5 Mg Tabs (Oxycodone hcl) .Marland Kitchen... 1po q 6 hrs as needed pain  Problem # 3:  GLUCOSE INTOLERANCE (ICD-271.3) asympt, follow for any onset polys  Complete Medication List: 1)  Omeprazole 20 Mg Cpdr (Omeprazole) .... 2 by mouth once daily 2)  Meclizine Hcl 12.5 Mg Tabs (Meclizine hcl) .Marland Kitchen.. 1po q 6 hrs as needed dizzy 3)  Alprazolam 0.25 Mg Tabs (Alprazolam) .... 1/2 - 1 by mouth once daily as needed 4)  Levaquin 500 Mg Tabs (Levofloxacin) .Marland Kitchen.. 1po once daily 5)  Oxycodone Hcl 5 Mg Tabs (Oxycodone hcl) .Marland Kitchen.. 1po q 6 hrs as needed pain  Patient Instructions: 1)  Please take all new medications as prescribed 2)  Continue all previous medications as before this visit  3)  Please go to the Lab in the basement for your urine tests today  4)  You are OK to go to Work later today 5)  Please keep your followup appt with GYN as planned Prescriptions: OXYCODONE HCL 5 MG TABS (OXYCODONE HCL) 1po q 6 hrs as needed pain  #30 x 0   Entered and Authorized by:   [name] MD   Signed by:   [name] MD on 05/05/2010   Method used:   Print then Give to Patient   RxID:   [id number] LEVAQUIN 500 MG TABS (LEVOFLOXACIN) 1po once daily  #10 x 0   Entered and  Authorized by:   [name] MD   Signed by:   [name] MD on 05/05/2010   Method used:   Print then Give to Patient   RxID:   [id number]

## 2010-11-24 NOTE — Assessment & Plan Note (Signed)
Summary: POST HOSP/PNEUMONIA/BLOOD TRANSFUSION-LB   Vital Signs:  Patient profile:   35 year old female Height:      67 inches Weight:      306.50 pounds BMI:     48.18 O2 Sat:      97 % on Room air Temp:     98.5 degrees F oral Pulse rate:   90 / minute BP sitting:   108 / 68  (left arm) Cuff size:   large  Vitals Entered By: Zella Ball Ewing CMA Duncan Dull) (May 15, 2010 8:57 AM)  O2 Flow:  Room air  CC: [facility], Pneumonia, blood transfusion/RE   CC:  [facility], Pneumonia, and blood transfusion/RE.  History of Present Illness: here to f/u ? pna with cough and bilat upper lobe infiltrates and significant mediastinal and hilar LA felt reactive on recent CTA (no PE, also on levaquin)  --    hospd july 19-20 with PNA, CP, anemia, anxiety;  also s/p endometrial ablation june 28;  has f/u appt later today with GYN and still having some vaginal bleeding;  no further CP;  did start prozac at time of d/c (and doxycyline) from hosp and tolerating well;  d/c hgb approx 8.6 - due for f/u cbc today;  no CP but still quite winded with non prod coughing still frequent; has some ? wheezing as well;  in retrospect also with ongoing cough for several months that seemed to improve with steroid treatment;  feels overall very fatigued for multiplle reasons and for the first time does not want to return to work  - last day of work was july 19 when she was taken from work to the ER.    Problems Prior to Update: 1)  Abdominal Pain, Lower  (ICD-789.09) 2)  Fever Unspecified  (ICD-780.60) 3)  Otitis Media, Acute, Bilateral  (ICD-382.9) 4)  Hyperlipidemia  (ICD-272.4) 5)  Glucose Intolerance  (ICD-271.3) 6)  Anemia-iron Deficiency  (ICD-280.9) 7)  Anxiety  (ICD-300.00) 8)  Smoker  (ICD-305.1) 9)  Wheezing  (ICD-786.07) 10)  Migraine Headache  (ICD-346.90) 11)  Gerd  (ICD-530.81) 12)  Peptic Ulcer Disease  (ICD-533.90) 13)  Glucose Intolerance  (ICD-271.3) 14)  Morbid Obesity   (ICD-278.01)  Medications Prior to Update: 1)  Omeprazole 20 Mg Cpdr (Omeprazole) .... 2 By Mouth Once Daily 2)  Meclizine Hcl 12.5 Mg Tabs (Meclizine Hcl) .Marland Kitchen.. 1po Q 6 Hrs As Needed Dizzy 3)  Alprazolam 0.25 Mg Tabs (Alprazolam) .... 1/2 - 1 By Mouth Once Daily As Needed 4)  Levaquin 500 Mg Tabs (Levofloxacin) .Marland Kitchen.. 1po Once Daily 5)  Oxycodone Hcl 5 Mg Tabs (Oxycodone Hcl) .Marland Kitchen.. 1po Q 6 Hrs As Needed Pain  Current Medications (verified): 1)  Omeprazole 20 Mg Cpdr (Omeprazole) .... 2 By Mouth Once Daily 2)  Meclizine Hcl 12.5 Mg Tabs (Meclizine Hcl) .Marland Kitchen.. 1po Q 6 Hrs As Needed Dizzy 3)  Alprazolam 0.25 Mg Tabs (Alprazolam) .... 1/2 - 1 By Mouth Once Daily As Needed 4)  Doxycycline Hyclate 100 Mg Caps (Doxycycline Hyclate) .Marland Kitchen.. 1 By Mouth Two Times A Day 5)  Oxycodone Hcl 5 Mg Tabs (Oxycodone Hcl) .Marland Kitchen.. 1po Q 6 Hrs As Needed Pain 6)  Fluoxetine Hcl 20 Mg Caps (Fluoxetine Hcl) .Marland Kitchen.. 1 By Mouth Once Daily 7)  Tessalon Perles 100 Mg Caps (Benzonatate) .Marland Kitchen.. 1-2 By Mouth Three Times A Day As Needed Cough 8)  Symbicort 160-4.5 Mcg/act Aero (Budesonide-Formoterol Fumarate) .... 2 Puffs Two Times A Day 9)  Proair Hfa 108 (90  Base) Mcg/act Aers (Albuterol Sulfate) .... 2 Puffs Four Times Per Day As Needed  Allergies (verified): 1)  ! Pcn  Past History:  Past Surgical History: Last updated: 05/05/2010 s/p uterine ablation  April 21, 2010  Social History: Last updated: 05/15/2010 Married Current Smoker Alcohol use-yes Drug use-no  Risk Factors: Smoking Status: current (12/11/2009)  Past Medical History: Morbid Obesity Glucose Intolerance Peptic ulcer disease GERD Migraine Anxiety Anemia-iron deficiency glucose intolerance Hyperlipidemia menorrhoagia PNA episode july 2011  GYN:  Dr Rosemary Holms  Social History: Married Current Smoker Alcohol use-yes Drug use-no  Review of Systems       all otherwise negative per pt -    Physical Exam  General:  alert and overweight-appearing.    Head:  normocephalic and atraumatic.   Eyes:  vision grossly intact, pupils equal, and pupils round.   Ears:  bilat tm's mild erythema Nose:  no external deformity and no nasal discharge.   Mouth:  no gingival abnormalities and pharynx pink and moist.   Neck:  supple and no masses.   Lungs:  normal respiratory effort, R decreased breath sounds, and L decreased breath sounds.  with ? trace wheezing Heart:  normal rate and regular rhythm.   Abdomen:  soft, non-tender, and normal bowel sounds.   Msk:  no joint tenderness and no joint swelling.   Extremities:  no edema, no erythema  Neurologic:  cranial nerves II-XII intact, strength normal in all extremities, and gait normal.   Skin:  color normal and no rashes.   Psych:  fatigued appearing, mod anxious   Impression & Recommendations:  Problem # 1:  PNEUMONIA (ICD-486)  Her updated medication list for this problem includes:    Doxycycline Hyclate 100 Mg Caps (Doxycycline hyclate) .Marland Kitchen... 1 by mouth two times a day bialt upper lobe with signif med and hilar adenopathy - cant r/o sarcoid I think but to cont the doxy for now,  consider pulm eval or CT f/u; work note given  Problem # 2:  WHEEZING (ICD-786.07)  mild, low grade but always seems to respont to depomedrol and prednisone - will add inhalers - symbicort and proair today after depomedrol shot today  Orders: Depo- Medrol 40mg  (J1030) Depo- Medrol 80mg  (J1040) Admin of Therapeutic Inj  intramuscular or subcutaneous (04540)  Problem # 3:  ANEMIA-IRON DEFICIENCY (ICD-280.9)  for f/u cbc today  Orders: TLB-CBC Platelet - w/Differential (85025-CBCD)  Problem # 4:  ANXIETY (ICD-300.00)  Her updated medication list for this problem includes:    Alprazolam 0.25 Mg Tabs (Alprazolam) .Marland Kitchen... 1/2 - 1 by mouth once daily as needed    Fluoxetine Hcl 20 Mg Caps (Fluoxetine hcl) .Marland Kitchen... 1 by mouth once daily add the prozac as above ;  f/u next visit  Problem # 5:  MENORRHAGIA  (ICD-626.2) to f/iu GYN later today, will try to forward cbc results  Complete Medication List: 1)  Omeprazole 20 Mg Cpdr (Omeprazole) .... 2 by mouth once daily 2)  Meclizine Hcl 12.5 Mg Tabs (Meclizine hcl) .Marland Kitchen.. 1po q 6 hrs as needed dizzy 3)  Alprazolam 0.25 Mg Tabs (Alprazolam) .... 1/2 - 1 by mouth once daily as needed 4)  Doxycycline Hyclate 100 Mg Caps (Doxycycline hyclate) .Marland Kitchen.. 1 by mouth two times a day 5)  Oxycodone Hcl 5 Mg Tabs (Oxycodone hcl) .Marland Kitchen.. 1po q 6 hrs as needed pain 6)  Fluoxetine Hcl 20 Mg Caps (Fluoxetine hcl) .Marland Kitchen.. 1 by mouth once daily 7)  Tessalon Perles 100 Mg Caps (Benzonatate) .Marland KitchenMarland KitchenMarland Kitchen  1-2 by mouth three times a day as needed cough 8)  Symbicort 160-4.5 Mcg/act Aero (Budesonide-formoterol fumarate) .... 2 puffs two times a day 9)  Proair Hfa 108 (90 Base) Mcg/act Aers (Albuterol sulfate) .... 2 puffs four times per day as needed  Patient Instructions: 1)  you had the steroid shot today 2)  start the symbicort at 2 puffs two times a day  3)  you can also use the proair HFA at 2 puffs four times per day as needed for wheezing and shortness of breath 4)  Please take all new medications as prescribed  - the cough pill 5)  Continue all previous medications as before this visit , including the doxycycline, and the prozac 6)  Please go to the Lab in the basement for your blood and/or urine tests today  7)  Please keep your Appt with GYN later today 8)  Please schedule a follow-up appointment in 1 month, or sooner if needed 9)  Call if not improving with the breathing for possible pulmonary referral Prescriptions: PROAIR HFA 108 (90 BASE) MCG/ACT AERS (ALBUTEROL SULFATE) 2 puffs four times per day as needed  #1 x 11   Entered and Authorized by:   [name] MD   Signed by:   [name] MD on 05/15/2010   Method used:   Print then Give to Patient   RxID:   [id number] SYMBICORT 160-4.5 MCG/ACT AERO (BUDESONIDE-FORMOTEROL FUMARATE) 2 puffs two times a day  #1 x  11   Entered and Authorized by:   [name] MD   Signed by:   [name] MD on 05/15/2010   Method used:   Print then Give to Patient   RxID:   [id number] TESSALON PERLES 100 MG CAPS (BENZONATATE) 1-2 by mouth three times a day as needed cough  #60 x 1   Entered and Authorized by:   [name] MD   Signed by:   [name] MD on 05/15/2010   Method used:   Print then Give to Patient   RxID:   [id number] FLUOXETINE HCL 20 MG CAPS (FLUOXETINE HCL) 1 by mouth once daily  #90 x 3   Entered and Authorized by:   [name] MD   Signed by:   [name] MD on 05/15/2010   Method used:   Print then Give to Patient   RxID:   [id number]    Medication Administration  Injection # 1:    Medication: Depo- Medrol 40mg     Diagnosis: WHEEZING (ICD-786.07)    Route: IM    Site: LUOQ gluteus    Exp Date: 01/2013    Lot #: [id number]    Mfr: [employer]    Given by: Zella Ball Ewing CMA Duncan Dull) (May 15, 2010 9:49 AM)  Injection # 2:    Medication: Depo- Medrol 80mg     Diagnosis: WHEEZING (ICD-786.07)    Route: IM    Site: LUOQ gluteus    Exp Date: 01/2013    Lot #: [id number]    Mfr: [employer]    Given by: Zella Ball Ewing CMA Duncan Dull) (May 15, 2010 9:49 AM)  Orders Added: 1)  Depo- Medrol 40mg  [J1030] 2)  Depo- Medrol 80mg  [J1040] 3)  Admin of Therapeutic Inj  intramuscular or subcutaneous [96372] 4)  TLB-CBC Platelet - w/Differential [85025-CBCD] 5)  Est. Patient Level IV [63016]

## 2010-11-24 NOTE — Progress Notes (Signed)
----   Converted from flag ---- ---- 12/16/2009 8:18 PM, [name] MD wrote: need old paper chart please ------------------------------  I have ordered chart

## 2010-11-24 NOTE — Letter (Signed)
Summary: Return to work paper  Return to work paper   Imported By: Lester Dunkirk 05/21/2010 10:41:28  _____________________________________________________________________  External Attachment:    Type:   Image     Comment:   External Document

## 2010-11-24 NOTE — Letter (Signed)
Summary: [employer]  [address]   [place], [place] [address]   Phone: [phone]  Fax: [phone]    Today's Date: May 28, 2010  Name of Patient: Nicole Melendez [facility]  The above named patient has a medical visit scheduled for Wednesday June 03, 2010.   Please take this into consideration when reviewing the time away from work/school.    Special Instructions:  [  ] None  [ X ] Patient  is to be off from work for the day on Wednesday August 10,2011 and can return to work the follow day Thursday June 04, 2010.  [  ] To be off until the next scheduled appointment on ______________________.  [  ] Other ________________________________________________________________ ________________________________________________________________________   Sincerely yours,   Marcelyn Bruins, M.D.

## 2010-11-24 NOTE — Letter (Signed)
Summary: Out of Work  [facility]  [address] [place], [place] [address]   Phone: [phone]  Fax: [phone]    May 19, 2010   Employee:  Nicole Melendez [place]am    To Whom It May Concern:   For Medical reasons, please excuse the above named employee from work for the following dates:  Start:   May 12, 2010  End:   May 25, 2010  Return to work May 25, 2010  If you need additional information, please feel free to contact our office.         Sincerely,    Dr. [name]

## 2010-11-24 NOTE — Letter (Signed)
Summary: Out of Work  [facility]  [address] [place], [place] [address]   Phone: [phone]  Fax: [phone]    September 14, 2010   Employee:  SABINE TENENBAUM Aja    To Whom It May Concern:   For Medical reasons, please excuse the above named employee from work for the following dates:  Start:   09/09/2010  End:   09/15/2010  If you need additional information, please feel free to contact our office.         Sincerely,    Dr. [name]

## 2010-11-24 NOTE — Assessment & Plan Note (Signed)
Summary: CANT CATCH HER BREATHE-LB   Vital Signs:  Patient profile:   35 year old female Height:      67 inches Weight:      304.75 pounds BMI:     47.90 O2 Sat:      98 % on Room air Temp:     98 degrees F oral Pulse rate:   80 / minute BP sitting:   112 / 64  (left arm) Cuff size:   large  Vitals Entered By: Zella Ball Ewing CMA Duncan Dull) (May 28, 2010 2:20 PM)  O2 Flow:  Room air CC: Breathing problems/RE   Primary Care Nicholous Girgenti:  [name] MD  CC:  Breathing problems/RE.  History of Present Illness: here after documented natural gas leak after smelling gas, called company yest and leak fixed, but pt with worsening sob /wheezing and using the proair inhaler up to 8 times per day; cough seems stable and mild productive;  Pt denies CP,  orthopnea, pnd, worsening LE edema, palps, dizziness or syncope but chest has sense of tightness.  No fever, wt loss,  loss of appetite or other constitutional symptoms except for 3 to 4 wks of night sweats.  Bronch unfort had to be postponed to next wed.  has some ongoing anxiety not well controlled with current meds but more situational it seems due to current illness;  denies worsening depresive symtpoms or suicidal ideation.  Denies polydipsia, polyuria. Overall good med complaicne and good tolerance.  Problems Prior to Update: 1)  Mediastinal Lymphadenopathy  (ICD-785.6) 2)  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) 3)  Vaginitis, Candidal  (ICD-112.1) 4)  Diarrhea, Acute  (ICD-787.91) 5)  Menorrhagia  (ICD-626.2) 6)  Pneumonia  (ICD-486) 7)  Abdominal Pain, Lower  (ICD-789.09) 8)  Fever Unspecified  (ICD-780.60) 9)  Otitis Media, Acute, Bilateral  (ICD-382.9) 10)  Hyperlipidemia  (ICD-272.4) 11)  Glucose Intolerance  (ICD-271.3) 12)  Anemia-iron Deficiency  (ICD-280.9) 13)  Anxiety  (ICD-300.00) 14)  Smoker  (ICD-305.1) 15)  Wheezing  (ICD-786.07) 16)  Migraine Headache  (ICD-346.90) 17)  Gerd  (ICD-530.81) 18)   Peptic Ulcer Disease  (ICD-533.90) 19)  Glucose Intolerance  (ICD-271.3) 20)  Morbid Obesity  (ICD-278.01)  Medications Prior to Update: 1)  Omeprazole 20 Mg Cpdr (Omeprazole) .... 2 By Mouth Once Daily 2)  Meclizine Hcl 12.5 Mg Tabs (Meclizine Hcl) .Marland Kitchen.. 1po Q 6 Hrs As Needed Dizzy 3)  Alprazolam 0.25 Mg Tabs (Alprazolam) .... 1/2 - 1 By Mouth Once Daily As Needed 4)  Oxycodone Hcl 5 Mg Tabs (Oxycodone Hcl) .Marland Kitchen.. 1po Q 6 Hrs As Needed Pain 5)  Fluoxetine Hcl 20 Mg Caps (Fluoxetine Hcl) .Marland Kitchen.. 1 By Mouth Once Daily 6)  Tessalon Perles 100 Mg Caps (Benzonatate) .Marland Kitchen.. 1-2 By Mouth Three Times A Day As Needed Cough 7)  Symbicort 160-4.5 Mcg/act Aero (Budesonide-Formoterol Fumarate) .... 2 Puffs Two Times A Day 8)  Proair Hfa 108 (90 Base) Mcg/act Aers (Albuterol Sulfate) .... 2 Puffs Four Times Per Day As Needed 9)  Metronidazole 250 Mg Tabs (Metronidazole) .Marland Kitchen.. 1 By Mouth Qid For 10 Days 10)  Fluconazole 150 Mg Tabs (Fluconazole) .Marland Kitchen.. 1po Every Other Day As Needed Yeast Infection 11)  Norethindrone Acetate 5 Mg Tabs (Norethindrone Acetate) .... Take 3 Tabs By Mouth Daily As Needed For Bleeding  Current Medications (verified): 1)  Omeprazole 20 Mg Cpdr (Omeprazole) .... 2 By Mouth Once Daily 2)  Meclizine Hcl 12.5 Mg Tabs (Meclizine Hcl) .Marland Kitchen.. 1po Q 6 Hrs As  Needed Dizzy 3)  Alprazolam 0.5 Mg Tabs (Alprazolam) .Marland Kitchen.. 1 By Mouth Two Times A Day As Needed 4)  Oxycodone Hcl 5 Mg Tabs (Oxycodone Hcl) .Marland Kitchen.. 1po Q 6 Hrs As Needed Pain 5)  Fluoxetine Hcl 20 Mg Caps (Fluoxetine Hcl) .Marland Kitchen.. 1 By Mouth Once Daily 6)  Tessalon Perles 100 Mg Caps (Benzonatate) .Marland Kitchen.. 1-2 By Mouth Three Times A Day As Needed Cough 7)  Symbicort 160-4.5 Mcg/act Aero (Budesonide-Formoterol Fumarate) .... 2 Puffs Two Times A Day 8)  Proair Hfa 108 (90 Base) Mcg/act Aers (Albuterol Sulfate) .... 2 Puffs Four Times Per Day As Needed 9)  Metronidazole 250 Mg Tabs (Metronidazole) .Marland Kitchen.. 1 By Mouth Qid For 10 Days 10)  Fluconazole 150 Mg Tabs  (Fluconazole) .Marland Kitchen.. 1po Every Other Day As Needed Yeast Infection 11)  Norethindrone Acetate 5 Mg Tabs (Norethindrone Acetate) .... Take 3 Tabs By Mouth Daily As Needed For Bleeding 12)  Prednisone 10 Mg Tabs (Prednisone) .... 3po Qd For 3days, Then 2po Qd For 3days, Then 1po Qd For 3days, Then Stop  Allergies (verified): 1)  ! Pcn 2)  ! Percocet  Past History:  Past Medical History: Last updated: 05/15/2010 Morbid Obesity Glucose Intolerance Peptic ulcer disease GERD Migraine Anxiety Anemia-iron deficiency glucose intolerance Hyperlipidemia menorrhoagia PNA episode july 2011  GYN:  Dr Rosemary Holms  Past Surgical History: Last updated: 05/05/2010 s/p uterine ablation  April 21, 2010  Social History: Last updated: 05/21/2010 pt is single and lives with Dalbert Batman pt has no children. pt works as a Human resources officer. former smoker.  started at age 50.  less than 1 ppd.  quit July 2011. Alcohol use-yes Drug use-no  Risk Factors: Smoking Status: current (12/11/2009)  Review of Systems       all otherwise negative per pt -    Physical Exam  General:  alert and overweight-appearing.   Head:  normocephalic and atraumatic.   Eyes:  vision grossly intact, pupils equal, and pupils round.   Ears:  R ear normal.  and left tm mild erythema Nose:  no external deformity and no nasal discharge.   Mouth:  no gingival abnormalities and pharynx pink and moist.   Neck:  supple.  with few nontender LA noted near angle of jaw on the right Lungs:  normal respiratory effort, R decreased breath sounds, and L decreased breath sounds.  with few wheezes, no rales Heart:  normal rate and regular rhythm.   Extremities:  no edema, no erythema    Impression & Recommendations:  Problem # 1:  DYSPNEA (ICD-786.05)  ? related to nat gas leak, vs anxiety vs underlying other as yet undefined condition;  OK for depomedrol IM todya, and pred pack for home and cont inhalers;  also for repeat cxr given  the gas leak to r/o radiographic oil droplets , as well as labs except hold on ABG for now; should f/u with bronch next wk as planned - doubt current tx will neg affect results  Orders: T-2 View CXR, Same Day (71020.5TC) TLB-BMP (Basic Metabolic Panel-BMET) (80048-METABOL) TLB-CBC Platelet - w/Differential (85025-CBCD) TLB-Hepatic/Liver Function Pnl (80076-HEPATIC) TLB-Sedimentation Rate (ESR) (85652-ESR)  Problem # 2:  ANXIETY (ICD-300.00)  Her updated medication list for this problem includes:    Alprazolam 0.5 Mg Tabs (Alprazolam) .Marland Kitchen... 1 by mouth two times a day as needed    Fluoxetine Hcl 20 Mg Caps (Fluoxetine hcl) .Marland Kitchen... 1 by mouth once daily ok for incr xanax as needed as above, Continue all other previous medications as  before this visit   Problem # 3:  GLUCOSE INTOLERANCE (ICD-271.3) pt to follow for onset polydipsia on the prednisone;  check labs today  Complete Medication List: 1)  Omeprazole 20 Mg Cpdr (Omeprazole) .... 2 by mouth once daily 2)  Meclizine Hcl 12.5 Mg Tabs (Meclizine hcl) .Marland Kitchen.. 1po q 6 hrs as needed dizzy 3)  Alprazolam 0.5 Mg Tabs (Alprazolam) .Marland Kitchen.. 1 by mouth two times a day as needed 4)  Oxycodone Hcl 5 Mg Tabs (Oxycodone hcl) .Marland Kitchen.. 1po q 6 hrs as needed pain 5)  Fluoxetine Hcl 20 Mg Caps (Fluoxetine hcl) .Marland Kitchen.. 1 by mouth once daily 6)  Tessalon Perles 100 Mg Caps (Benzonatate) .Marland Kitchen.. 1-2 by mouth three times a day as needed cough 7)  Symbicort 160-4.5 Mcg/act Aero (Budesonide-formoterol fumarate) .... 2 puffs two times a day 8)  Proair Hfa 108 (90 Base) Mcg/act Aers (Albuterol sulfate) .... 2 puffs four times per day as needed 9)  Metronidazole 250 Mg Tabs (Metronidazole) .Marland Kitchen.. 1 by mouth qid for 10 days 10)  Fluconazole 150 Mg Tabs (Fluconazole) .Marland Kitchen.. 1po every other day as needed yeast infection 11)  Norethindrone Acetate 5 Mg Tabs (Norethindrone acetate) .... Take 3 tabs by mouth daily as needed for bleeding 12)  Prednisone 10 Mg Tabs (Prednisone) .... 3po qd  for 3days, then 2po qd for 3days, then 1po qd for 3days, then stop  Patient Instructions: 1)  you had the steroid shot today 2)  Please take all new medications as prescribed - the prednisone 3)  Continue all previous medications as before this visit , including the inhalers 4)  Please go to Radiology in the basement level for your X-Ray today  5)  Please go to the Lab in the basement for your blood and/or urine tests today  6)  Please schedule a follow-up appointment next wk with Dr Shelle Iron and bronchoscopy as planned Prescriptions: PREDNISONE 10 MG TABS (PREDNISONE) 3po qd for 3days, then 2po qd for 3days, then 1po qd for 3days, then stop  #18 x 0   Entered and Authorized by:   [name] MD   Signed by:   [name] MD on 05/28/2010   Method used:   Print then Give to Patient   RxID:   [id number] ALPRAZOLAM 0.5 MG TABS (ALPRAZOLAM) 1 by mouth two times a day as needed  #60 x 1   Entered and Authorized by:   [name] MD   Signed by:   [name] MD on 05/28/2010   Method used:   Print then Give to Patient   RxID:   [id number]   Appended Document: CANT CATCH HER BREATHE-LB        Medication Administration  Injection # 1:    Medication: Depo- Medrol 40mg     Diagnosis: DYSPNEA (ICD-786.05)    Route: IM    Site: LUOQ gluteus    Exp Date: 01/2013    Lot #: [id number]    Mfr: Pharmacia    Comments: Patient received 120mg  Depo-Medrol    Patient tolerated injection without complications    Given by: Zella Ball Ewing CMA Duncan Dull) (May 28, 2010 4:26 PM)  Injection # 2:    Medication: Depo- Medrol 80mg     Diagnosis: DYSPNEA (ICD-786.05)    Route: IM    Site: LUOQ gluteus    Exp Date: 01/2013    Lot #: [id number]    Mfr: Pharmacia    Given by: Zella Ball Ewing CMA (AAMA) (May 28, 2010 4:26 PM)  Orders Added: 1)  Depo- Medrol 40mg  [J1030] 2)  Depo- Medrol 80mg  [J1040] 3)  Admin of Therapeutic Inj  intramuscular or subcutaneous [41324]

## 2010-11-24 NOTE — Assessment & Plan Note (Signed)
Summary: 2 WK FU/NWS  #   Vital Signs:  Patient profile:   35 year old female Height:      67 inches Weight:      305 pounds BMI:     47.94 O2 Sat:      97 % on Room air Temp:     98.1 degrees F oral Pulse rate:   97 / minute BP sitting:   120 / 78  (left arm) Cuff size:   large  Vitals Entered By: Alysia Penna (July 31, 2010 9:01 AM)  O2 Flow:  Room air CC: pt here for 2 week follow up. /cp sma Comments pt no longer taking Novolog Flexpen.   Primary Care Provider:  [name] MD  CC:  pt here for 2 week follow up. /cp sma.  History of Present Illness: here to f/u; now off prednisone per Dr Shelle Iron,  recent cxr/ua neg;  Pt denies CP, worsening sob, doe, wheezing, orthopnea, pnd, palps, dizziness or syncope  Pt denies new neuro symptoms such as headache, facial or extremity weakness  Pt denies polydipsia, polyuria, or low sugar symptoms such as shakiness improved with eating.  Overall good compliance with meds, trying to follow low chol, DM diet, wt stable, little excercise however CBG's have been in the lower 100's.  Not having to use the sliding scale insulin in the past 2 wks.  No overt bleeing or brusiing except for an insect bite to the right forearm , seen in ER 4 days ago - tx with benadryl and tylenol - now resolved.  Did have fever this AM at 100 with mild prod cough, and mild sinus congestion, but no ST, rash, or bowel or bladder changes, except for decreased overall urine volume she thinks since onset of worsening peripheral edema to the lower legs since approx 4 days.  Only taking the iron sporadicaly (not much at all) with recent mild anemia.  Has been thirsty and not really pushing fluids except hot on the job - works [employer] , but not in the [facility].   LMP 1 wk ago, normal and not pregnant per pt . Lives with female partner. 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) 12)  Vaginitis, Candidal  (ICD-112.1) 13)  Diarrhea, Acute  (ICD-787.91) 14)  Menorrhagia  (ICD-626.2) 15)  Pneumonia  (ICD-486) 16)  Abdominal Pain, Lower  (ICD-789.09) 17)  Fever Unspecified  (ICD-780.60) 18)  Otitis Media, Acute, Bilateral  (ICD-382.9) 19)  Hyperlipidemia  (ICD-272.4) 20)  Glucose Intolerance  (ICD-271.3) 21)  Anemia-iron Deficiency  (ICD-280.9) 22)  Anxiety  (ICD-300.00) 23)  Smoker  (ICD-305.1) 24)  Wheezing  (ICD-786.07) 25)  Migraine Headache  (ICD-346.90) 26)  Gerd  (ICD-530.81) 27)  Peptic Ulcer Disease  (ICD-533.90) 28)  Glucose Intolerance  (ICD-271.3) 29)  Morbid Obesity  (ICD-278.01)  Medications Prior to Update: 1)  Omeprazole 20 Mg Cpdr (Omeprazole) .... 2 By Mouth Once Daily 2)  Meclizine Hcl 12.5 Mg Tabs (Meclizine Hcl) .Marland Kitchen.. 1po Q 6 Hrs As Needed Dizzy 3)  Alprazolam 0.5 Mg Tabs (Alprazolam) .Marland Kitchen.. 1 By  Mouth Two Times A Day As Needed 4)  Fluoxetine Hcl 20 Mg Caps (Fluoxetine Hcl) .Marland Kitchen.. 1 By Mouth Once Daily 5)  Tessalon Perles 100 Mg Caps (Benzonatate) .Marland Kitchen.. 1-2 By Mouth Three Times A Day As Needed Cough 6)  Symbicort 160-4.5 Mcg/act Aero (Budesonide-Formoterol Fumarate) .... 2 Puffs Two Times A Day 7)  Proair Hfa 108 (90 Base) Mcg/act Aers (Albuterol Sulfate) .... 2 Puffs Four Times Per Day As Needed 8)  Fluconazole 150 Mg Tabs (Fluconazole) .Marland Kitchen.. 1po Every Other Day As Needed Yeast Infection 9)  Zolpidem Tartrate 12.5 Mg Cr-Tabs (Zolpidem Tartrate) .Marland Kitchen.. 1 By Mouth At Bedtime As Needed 10)  Fioricet 50-325-40 Mg Tabs (Butalbital-Apap-Caffeine) .Marland Kitchen.. 1po Once Daily As Needed 11)  Onetouch Test  Strp  (Glucose Blood) .... Use Asd Four Times Per Day 12)  Lancets  Misc (Lancets) .... Use Asd Four Times Per Day 13)  Novolog Flexpen 100 Unit/ml Soln (Insulin Aspart) .... Use Asd Sliding Scale 14)  Novolog Flexpen Needles .... Use Asd Four Times Per Day As Needed Sliding Scale 15)  Janumet 50-500 Mg Tabs (Sitagliptin-Metformin Hcl) .Marland Kitchen.. 1poi Two Times A Day 16)  Prednisone 10 Mg  Tabs (Prednisone) .... Take As Directed  Current Medications (verified): 1)  Omeprazole 20 Mg Cpdr (Omeprazole) .... 2 By Mouth Once Daily 2)  Meclizine Hcl 12.5 Mg Tabs (Meclizine Hcl) .Marland Kitchen.. 1po Q 6 Hrs As Needed Dizzy 3)  Alprazolam 0.5 Mg Tabs (Alprazolam) .Marland Kitchen.. 1 By Mouth Two Times A Day As Needed 4)  Fluoxetine Hcl 20 Mg Caps (Fluoxetine Hcl) .Marland Kitchen.. 1 By Mouth Once Daily 5)  Tessalon Perles 100 Mg Caps (Benzonatate) .Marland Kitchen.. 1-2 By Mouth Three Times A Day As Needed Cough 6)  Symbicort 160-4.5 Mcg/act Aero (Budesonide-Formoterol Fumarate) .... 2 Puffs Two Times A Day 7)  Proair Hfa 108 (90 Base) Mcg/act Aers (Albuterol Sulfate) .... 2 Puffs Four Times Per Day As Needed 8)  Fluconazole 150 Mg Tabs (Fluconazole) .Marland Kitchen.. 1po Every Other Day As Needed Yeast Infection 9)  Zolpidem Tartrate 12.5 Mg Cr-Tabs (Zolpidem Tartrate) .Marland Kitchen.. 1 By Mouth At Bedtime As Needed 10)  Fioricet 50-325-40 Mg Tabs (Butalbital-Apap-Caffeine) .Marland Kitchen.. 1po Once Daily As Needed 11)  Onetouch Test  Strp (Glucose Blood) .... Use Asd Four Times Per Day 12)  Lancets  Misc (Lancets) .... Use Asd Four Times Per Day 13)  Janumet 50-500 Mg Tabs (Sitagliptin-Metformin Hcl) .Marland Kitchen.. 1poi Two Times A Day 14)  Azithromycin 250 Mg Tabs (Azithromycin) .... 2po Qd For 1 Day, Then 1po Qd For 4days, Then Stop 15)  Ferrous Sulfate 325 (65 Fe) Mg Tbec (Ferrous Sulfate) .Marland Kitchen.. 1po Once Daily 16)  Venofer 20 Mg/ml Soln (Iron Sucrose) .... 5 Cc Iv Q Wk For 3 Wks 17)  Hydrochlorothiazide 12.5 Mg Caps (Hydrochlorothiazide) .Marland Kitchen.. 1po Once Daily  Allergies (verified): 1)  ! Pcn 2)  !  Percocet  Past History:  Past Surgical History: Last updated: 05/05/2010 s/p uterine ablation  April 21, 2010  Social History: Last updated: 07/31/2010 pt is single and lives with Dalbert Batman - also pt of [facility] pt has no children. pt works as a Human resources officer. former smoker.  started at age 69.  less than 1 ppd.  quit July 2011. Alcohol use-yes Drug use-no  Risk Factors: Smoking Status: current (12/11/2009)  Past Medical History: Morbid Obesity Glucose Intolerance Peptic ulcer disease GERD Migraine Anxiety Anemia-iron deficiency glucose intolerance Hyperlipidemia menorrhoagia PNA episode july 2011 GYN:  Dr Rosemary Holms Diabetes mellitus, type II - steroid related  Social  History: pt is single and lives with Dalbert Batman - also pt of [facility] pt has no children. pt works as a Human resources officer. former smoker.  started at age 30.  less than 1 ppd.  quit July 2011. Alcohol use-yes Drug use-no  Review of Systems       all otherwise negative per pt -    Physical Exam  General:  alert and overweight-appearing.  , mild ill  Head:  normocephalic and atraumatic.   Eyes:  vision grossly intact, pupils equal, and pupils round.   Ears:  right sinus tender, left nontender, TM's clear, canals clear Nose:  nasal dischargemucosal pallor and mucosal edema.   Mouth:  pharyngeal erythema and fair dentition.   Neck:  supple and no masses.   Lungs:  normal respiratory effort and normal breath sounds.   Heart:  normal rate and regular rhythm.   Abdomen:  soft and normal bowel sounds.  but mild tender epigstrium and mid lower abd without guarding or rebound Extremities:  trace to 1+ edema bilat   Impression & Recommendations:  Problem # 1:  SINUSITIS- ACUTE-NOS (ICD-461.9)  Her updated medication list for this problem includes:    Tessalon Perles 100 Mg Caps (Benzonatate) .Marland Kitchen... 1-2 by mouth three times a day as needed cough    Azithromycin 250 Mg Tabs (Azithromycin) .Marland Kitchen... 2po qd for 1  day, then 1po qd for 4days, then stop treat as above, f/u any worsening signs or symptoms  Problem # 2:  PERIPHERAL EDEMA (ICD-782.3)  differential includes sequelae of resolution of polyuria and by mouth fluids; worsening anemia, cardiac, pulm htn vs other-   for ecg, echo, and re-check labs below  Orders: EKG w/ Interpretation (93000) Echo Referral (Echo)  Her updated medication list for this problem includes:    Hydrochlorothiazide 12.5 Mg Caps (Hydrochlorothiazide) .Marland Kitchen... 1po once daily  Problem # 3:  ANEMIA-IRON DEFICIENCY (ICD-280.9)  encouraged to take the iron suppleemnt - 1 per day;  to recheck labs   Orders: TLB-IBC Pnl (Iron/FE;Transferrin) (83550-IBC) TLB-CBC Platelet - w/Differential (85025-CBCD)  Her updated medication list for this problem includes:    Ferrous Sulfate 325 (65 Fe) Mg Tbec (Ferrous sulfate) .Marland Kitchen... 1po once daily    Venofer 20 Mg/ml Soln (Iron sucrose) .Marland KitchenMarland KitchenMarland KitchenMarland Kitchen 5 cc iv q wk for 3 wks  Problem # 4:  DIABETES MELLITUS, TYPE II (ICD-250.00)  The following medications were removed from the medication list:    Novolog Flexpen 100 Unit/ml Soln (Insulin aspart) ..... Use asd sliding scale Her updated medication list for this problem includes:    Janumet 50-500 Mg Tabs (Sitagliptin-metformin hcl) .Marland Kitchen... 1poi two times a day improved, Continue all previous medications as before this visit , Pt to cont DM diet, excercise, wt loss efforts; to check labs today ; d/c SSI  Orders: TLB-BMP (Basic Metabolic Panel-BMET) (80048-METABOL) TLB-Lipid Panel (80061-LIPID) TLB-A1C / Hgb A1C (Glycohemoglobin) (83036-A1C)  Complete Medication List: 1)  Omeprazole 20 Mg Cpdr (Omeprazole) .... 2 by mouth once daily 2)  Meclizine Hcl 12.5 Mg Tabs (Meclizine hcl) .Marland Kitchen.. 1po q 6 hrs as needed dizzy 3)  Alprazolam 0.5 Mg Tabs (Alprazolam) .Marland Kitchen.. 1 by mouth two times a day as needed 4)  Fluoxetine Hcl 20 Mg Caps (Fluoxetine hcl) .Marland Kitchen.. 1 by mouth once daily 5)  Tessalon Perles 100 Mg  Caps (Benzonatate) .Marland Kitchen.. 1-2 by mouth three times a day as needed cough 6)  Symbicort 160-4.5 Mcg/act Aero (Budesonide-formoterol fumarate) .... 2 puffs two times a day 7)  Proair Hfa 108 (90 Base) Mcg/act Aers (Albuterol sulfate) .... 2 puffs four times per day as needed 8)  Fluconazole 150 Mg Tabs (Fluconazole) .Marland Kitchen.. 1po every other day as needed yeast infection 9)  Zolpidem Tartrate 12.5 Mg Cr-tabs (Zolpidem tartrate) .Marland Kitchen.. 1 by mouth at bedtime as needed 10)  Fioricet 50-325-40 Mg Tabs (Butalbital-apap-caffeine) .Marland Kitchen.. 1po once daily as needed 11)  Onetouch Test Strp (Glucose blood) .... Use asd four times per day 12)  Lancets Misc (Lancets) .... Use asd four times per day 13)  Janumet 50-500 Mg Tabs (Sitagliptin-metformin hcl) .Marland Kitchen.. 1poi two times a day 14)  Azithromycin 250 Mg Tabs (Azithromycin) .... 2po qd for 1 day, then 1po qd for 4days, then stop 15)  Ferrous Sulfate 325 (65 Fe) Mg Tbec (Ferrous sulfate) .Marland Kitchen.. 1po once daily 16)  Venofer 20 Mg/ml Soln (Iron sucrose) .... 5 cc iv q wk for 3 wks 17)  Hydrochlorothiazide 12.5 Mg Caps (Hydrochlorothiazide) .Marland Kitchen.. 1po once daily  Patient Instructions: 1)  OK to stop the insulin as you have 2)  Please take all new medications as prescribed - the antibiotic 3)  Continue all other previous medications as before this visit , including the iron - 1 pill per day 4)  Please go to the Lab in the basement for your blood and/or urine tests today 5)  Please call the number on the [facility] Card for results of your testing 6)  Your EKG was good today 7)  You will be contacted about the referral(s) to: echocardiogram 8)  Please schedule a follow-up appointment in 4 months with CPX labs and: 9)  HbgA1C prior to visit, ICD-9: 250.02 10)  Urine Microalbumin prior to visit, ICD-9: Prescriptions: HYDROCHLOROTHIAZIDE 12.5 MG CAPS (HYDROCHLOROTHIAZIDE) 1po once daily  #30 x 0   Entered and Authorized by:   [name] MD   Signed by:   [name] MD on  07/31/2010   Method used:   Print then Give to Patient   RxID:   [id number] AZITHROMYCIN 250 MG TABS (AZITHROMYCIN) 2po qd for 1 day, then 1po qd for 4days, then stop  #6 x 1   Entered and Authorized by:   [name] MD   Signed by:   [name] MD on 07/31/2010   Method used:   Print then Give to Patient   RxID:   [id number]

## 2010-11-26 NOTE — Consult Note (Signed)
Summary: [facility]   Imported By: Sherian Rein 10/09/2010 09:02:07  _____________________________________________________________________  External Attachment:    Type:   Image     Comment:   External Document

## 2010-11-27 ENCOUNTER — Other Ambulatory Visit: Payer: Self-pay

## 2010-11-27 ENCOUNTER — Ambulatory Visit: Admit: 2010-11-27 | Payer: Self-pay | Admitting: Internal Medicine

## 2010-12-04 ENCOUNTER — Ambulatory Visit: Payer: Self-pay | Admitting: Pulmonary Disease

## 2010-12-04 ENCOUNTER — Ambulatory Visit: Payer: Self-pay | Admitting: Internal Medicine

## 2010-12-10 ENCOUNTER — Encounter: Payer: Self-pay | Admitting: Internal Medicine

## 2010-12-10 ENCOUNTER — Ambulatory Visit (INDEPENDENT_AMBULATORY_CARE_PROVIDER_SITE_OTHER): Payer: 59 | Admitting: Internal Medicine

## 2010-12-10 DIAGNOSIS — R062 Wheezing: Secondary | ICD-10-CM

## 2010-12-10 DIAGNOSIS — J209 Acute bronchitis, unspecified: Secondary | ICD-10-CM

## 2010-12-10 DIAGNOSIS — F329 Major depressive disorder, single episode, unspecified: Secondary | ICD-10-CM

## 2010-12-10 DIAGNOSIS — E119 Type 2 diabetes mellitus without complications: Secondary | ICD-10-CM

## 2010-12-10 NOTE — Letter (Signed)
Summary: Alben Deeds MD/[facility]  Alben Deeds MD/[facility]   Imported By: Lester [place] 12/01/2010 09:26:26  _____________________________________________________________________  External Attachment:    Type:   Image     Comment:   External Document

## 2010-12-16 NOTE — Assessment & Plan Note (Signed)
Summary: strangling coughing spells causing difficulty breathing/lb   Vital Signs:  Patient profile:   35 year old female Height:      67 inches Weight:      293.25 pounds BMI:     46.10 O2 Sat:      97 % on Room air Temp:     99.2 degrees F oral Pulse rate:   88 / minute BP sitting:   120 / 78  (left arm) Cuff size:   large  Vitals Entered By: Zella Ball Ewing CMA Duncan Dull) (December 10, 2010 3:13 PM)  O2 Flow:  Room air CC: Cough/RE   Primary Care Provider:  [name] MD  CC:  Cough/RE.  History of Present Illness: here to f/u with acute onset 3 days mild to mod fever, general weakness and malaise, ST , increased prod cough with greenish sputum, and now mild wheezing today with sob.  Pt denies CP,  orthopnea, pnd, worsening LE edema, palps, dizziness or syncope  .  Pt denies new neuro symptoms such as headache, facial or extremity weakness  Pt denies polydipsia, polyuria   Overall good compliance with meds, trying to follow low chol diet, wt stable, little excercise however .  No  wt loss, night sweats, loss of appetite or other constitutional symptoms, though has signficant fatigue, and difficult to walk into work and states she has to walk a mile from her required parking area to workspace.  Denies worsening depressive symptoms, suicidal ideation, or panic. though has ongoing anxiety, overall stable.    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) 17)  Vaginitis, Candidal  (ICD-112.1) 18)  Diarrhea, Acute  (ICD-787.91) 19)  Menorrhagia  (ICD-626.2) 20)  Pneumonia  (ICD-486) 21)  Abdominal Pain, Lower  (ICD-789.09) 22)  Fever Unspecified  (ICD-780.60) 23)  Otitis Media, Acute, Bilateral  (ICD-382.9) 24)  Hyperlipidemia  (ICD-272.4) 25)  Glucose Intolerance  (ICD-271.3) 26)  Anemia-iron Deficiency  (ICD-280.9) 27)  Anxiety  (ICD-300.00) 28)  Smoker  (ICD-305.1) 29)  Wheezing  (ICD-786.07) 30)  Migraine Headache  (ICD-346.90) 31)  Gerd  (ICD-530.81) 32)  Peptic Ulcer Disease  (ICD-533.90) 33)  Glucose Intolerance  (ICD-271.3) 34)  Morbid Obesity  (ICD-278.01)  Medications Prior to Update: 1)  Omeprazole 20 Mg Cpdr (Omeprazole) .... 2 By Mouth Once Daily 2)  Meclizine Hcl 12.5 Mg Tabs (Meclizine Hcl) .Marland Kitchen.. 1po Q 6 Hrs As Needed Dizzy 3)  Alprazolam 0.5 Mg Tabs (Alprazolam) .Marland Kitchen.. 1 By Mouth Two Times A Day As Needed 4)  Fluoxetine Hcl 40 Mg Caps (Fluoxetine Hcl) .Marland Kitchen.. 1 By Mouth Once Daily 5)  Symbicort 160-4.5 Mcg/act Aero (Budesonide-Formoterol Fumarate) .... 2 Puffs Two Times A Day 6)  Proair Hfa 108 (90 Base) Mcg/act Aers (Albuterol Sulfate) .... 2 Puffs Four Times Per Day As Needed 7)  Zolpidem Tartrate 12.5 Mg Cr-Tabs (Zolpidem Tartrate) .Marland Kitchen.. 1 By Mouth At Bedtime As Needed 8)  Onetouch Test  Strp (Glucose Blood) .Marland KitchenMarland KitchenMarland Kitchen  Use Asd Four Times Per Day 9)  Lancets  Misc (Lancets) .... Use Asd Four Times Per Day 10)  Janumet 50-500 Mg Tabs (Sitagliptin-Metformin Hcl) .Marland Kitchen.. 1poi Two Times A Day 11)  Ferrous Sulfate 325 (65 Fe) Mg Tbec (Ferrous Sulfate) .Marland Kitchen.. 1po Two Times A Day 12)  Furosemide 20 Mg Tabs (Furosemide) .... Take 1 Tab By Mouth Every Day 13)  Tylenol Arthritis Pain 650 Mg Cr-Tabs (Acetaminophen) .Marland Kitchen.. 1-2 By Mouth Three Times A Day As Needed 14)  Celebrex 200 Mg Caps (Celecoxib) .... Take 1 Tablet By Mouth Once A Day As Needed 15)  Hydroxyzine Hcl 25 Mg Tabs (Hydroxyzine  Hcl) .Marland Kitchen.. 1 - 2 By Mouth Q 6 Hrs As Needed Itching  Current Medications (verified): 1)  Omeprazole 20 Mg Cpdr (Omeprazole) .... 2 By Mouth Once Daily 2)  Meclizine Hcl 12.5 Mg Tabs (Meclizine Hcl) .Marland Kitchen.. 1po Q 6 Hrs As Needed Dizzy 3)  Alprazolam 0.5 Mg Tabs (Alprazolam) .Marland Kitchen.. 1 By Mouth Two Times A Day As Needed 4)  Fluoxetine Hcl 40 Mg Caps (Fluoxetine Hcl) .Marland Kitchen.. 1 By Mouth Once Daily 5)  Symbicort 160-4.5 Mcg/act Aero (Budesonide-Formoterol Fumarate) .... 2 Puffs Two Times A Day 6)  Proair Hfa 108 (90 Base) Mcg/act Aers (Albuterol Sulfate) .... 2 Puffs Four Times Per Day As Needed 7)  Zolpidem Tartrate 12.5 Mg Cr-Tabs (Zolpidem Tartrate) .Marland Kitchen.. 1 By Mouth At Bedtime As Needed 8)  Onetouch Test  Strp (Glucose Blood) .... Use Asd Four Times Per Day 9)  Lancets  Misc (Lancets) .... Use Asd Four Times Per Day 10)  Janumet 50-500 Mg Tabs (Sitagliptin-Metformin Hcl) .Marland Kitchen.. 1poi Two Times A Day 11)  Ferrous Sulfate 325 (65 Fe) Mg Tbec (Ferrous Sulfate) .Marland Kitchen.. 1po Two Times A Day 12)  Furosemide 20 Mg Tabs (Furosemide) .... Take 1 Tab By Mouth Every Day 13)  Tylenol Arthritis Pain 650 Mg Cr-Tabs (Acetaminophen) .Marland Kitchen.. 1-2 By Mouth Three Times A Day As Needed 14)  Celebrex 200 Mg Caps (Celecoxib) .... Take 1 Tablet By Mouth Once A Day As Needed 15)  Hydroxyzine Hcl 25 Mg Tabs (Hydroxyzine Hcl) .Marland Kitchen.. 1 - 2 By Mouth Q 6 Hrs As Needed Itching 16)  Levofloxacin 250 Mg Tabs (Levofloxacin) .Marland Kitchen.. 1po Once Daily 17)  Tussionex Pennkinetic Er 10-8 Mg/20ml Lqcr (Hydrocod Polst-Chlorphen Polst) .Marland Kitchen.. 1 Tsp By Mouth Two Times A Day As Needed  Allergies (verified): 1)  ! Pcn 2)  ! Percocet 3)  ! * Zpack   Impression & Recommendations:  Problem # 1:  BRONCHITIS, ACUTE (ICD-466.0)  Her updated medication list for this problem includes:    Symbicort 160-4.5 Mcg/act Aero (Budesonide-formoterol fumarate) .Marland Kitchen... 2 puffs two times a day    Proair Hfa 108 (90 Base) Mcg/act Aers (Albuterol sulfate) .Marland Kitchen... 2 puffs four times per  day as needed    Levofloxacin 250 Mg Tabs (Levofloxacin) .Marland Kitchen... 1po once daily    Tussionex Pennkinetic Er 10-8 Mg/53ml Lqcr (Hydrocod polst-chlorphen polst) .Marland Kitchen... 1 tsp by mouth two times a day as needed  Take antibiotics and other medications as directed. Encouraged to push clear liquids, get enough rest, and take acetaminophen as needed. To be seen in 5-7 days if no improvement, sooner if worse.  treat as above, f/u any worsening signs or symptoms   Problem # 2:  WHEEZING (ICD-786.07) mild, likely due to above, for predpack for home as well  Problem # 3:  DIABETES MELLITUS, TYPE II (ICD-250.00)  Her updated medication list for this problem includes:  Janumet 50-500 Mg Tabs (Sitagliptin-metformin hcl) .Marland Kitchen... 1poi two times a day  Labs Reviewed: Creat: 0.7 (09/01/2010)    Reviewed HgBA1c results: 9.9 (07/31/2010)  10.8 (07/07/2010) stable overall by hx and exam, ok to continue meds/tx as is , pt to call for onset worsening polys or cbg's > 200 (does have insulin at home for as needed use)  Problem # 4:  DEPRESSION (ICD-311)  Her updated medication list for this problem includes:    Alprazolam 0.5 Mg Tabs (Alprazolam) .Marland Kitchen... 1 by mouth two times a day as needed    Fluoxetine Hcl 40 Mg Caps (Fluoxetine hcl) .Marland Kitchen... 1 by mouth once daily    Hydroxyzine Hcl 25 Mg Tabs (Hydroxyzine hcl) .Marland Kitchen... 1 - 2 by mouth q 6 hrs as needed itching  Discussed treatment options, including trial of antidpressant medication. Patient agrees to call if any worsening of symptoms or thoughts of doing harm arise. Verified that the patient has no suicidal ideation at this time.   stable overall by hx and exam, ok to continue meds/tx as is   Complete Medication List: 1)  Omeprazole 20 Mg Cpdr (Omeprazole) .... 2 by mouth once daily 2)  Meclizine Hcl 12.5 Mg Tabs (Meclizine hcl) .Marland Kitchen.. 1po q 6 hrs as needed dizzy 3)  Alprazolam 0.5 Mg Tabs (Alprazolam) .Marland Kitchen.. 1 by mouth two times a day as needed 4)  Fluoxetine Hcl 40  Mg Caps (Fluoxetine hcl) .Marland Kitchen.. 1 by mouth once daily 5)  Symbicort 160-4.5 Mcg/act Aero (Budesonide-formoterol fumarate) .... 2 puffs two times a day 6)  Proair Hfa 108 (90 Base) Mcg/act Aers (Albuterol sulfate) .... 2 puffs four times per day as needed 7)  Zolpidem Tartrate 12.5 Mg Cr-tabs (Zolpidem tartrate) .Marland Kitchen.. 1 by mouth at bedtime as needed 8)  Onetouch Test Strp (Glucose blood) .... Use asd four times per day 9)  Lancets Misc (Lancets) .... Use asd four times per day 10)  Janumet 50-500 Mg Tabs (Sitagliptin-metformin hcl) .Marland Kitchen.. 1poi two times a day 11)  Ferrous Sulfate 325 (65 Fe) Mg Tbec (Ferrous sulfate) .Marland Kitchen.. 1po two times a day 12)  Furosemide 20 Mg Tabs (Furosemide) .... Take 1 tab by mouth every day 13)  Tylenol Arthritis Pain 650 Mg Cr-tabs (Acetaminophen) .Marland Kitchen.. 1-2 by mouth three times a day as needed 14)  Celebrex 200 Mg Caps (Celecoxib) .... Take 1 tablet by mouth once a day as needed 15)  Hydroxyzine Hcl 25 Mg Tabs (Hydroxyzine hcl) .Marland Kitchen.. 1 - 2 by mouth q 6 hrs as needed itching 16)  Levofloxacin 250 Mg Tabs (Levofloxacin) .Marland Kitchen.. 1po once daily 17)  Tussionex Pennkinetic Er 10-8 Mg/15ml Lqcr (Hydrocod polst-chlorphen polst) .Marland Kitchen.. 1 tsp by mouth two times a day as needed   Patient Instructions: 1)  Please take all new medications as prescribed 2)  Continue all previous medications as before this visit  3)  Please schedule a follow-up appointment March 2 as planned Prescriptions: TUSSIONEX PENNKINETIC ER 10-8 MG/5ML LQCR (HYDROCOD POLST-CHLORPHEN POLST) 1 tsp by mouth two times a day as needed  #6oz x 1   Entered and Authorized by:   [name] MD   Signed by:   [name] MD on 12/10/2010   Method used:   Print then Give to Patient   RxID:   [id number] LEVOFLOXACIN 250 MG TABS (LEVOFLOXACIN) 1po once daily  #10 x 0   Entered and Authorized by:   [name] MD   Signed by:   [name] MD  on 12/10/2010   Method used:   Print then Give to Patient   RxID:    [id number]    Orders Added: 1)  Est. Patient Level IV [62952]

## 2010-12-25 ENCOUNTER — Ambulatory Visit: Payer: Self-pay | Admitting: Internal Medicine

## 2011-01-01 ENCOUNTER — Ambulatory Visit: Payer: Self-pay | Admitting: Pulmonary Disease

## 2011-01-01 ENCOUNTER — Ambulatory Visit: Payer: Self-pay | Admitting: Internal Medicine

## 2011-01-06 ENCOUNTER — Other Ambulatory Visit: Payer: Self-pay | Admitting: Internal Medicine

## 2011-01-06 ENCOUNTER — Encounter: Payer: Self-pay | Admitting: Internal Medicine

## 2011-01-06 ENCOUNTER — Ambulatory Visit (INDEPENDENT_AMBULATORY_CARE_PROVIDER_SITE_OTHER): Payer: 59 | Admitting: Internal Medicine

## 2011-01-06 ENCOUNTER — Other Ambulatory Visit: Payer: 59

## 2011-01-06 DIAGNOSIS — Z0000 Encounter for general adult medical examination without abnormal findings: Secondary | ICD-10-CM

## 2011-01-06 DIAGNOSIS — E119 Type 2 diabetes mellitus without complications: Secondary | ICD-10-CM

## 2011-01-06 LAB — CBC WITH DIFFERENTIAL/PLATELET
Basophils Absolute: 0 10*3/uL (ref 0.0–0.1)
Eosinophils Absolute: 0.3 10*3/uL (ref 0.0–0.7)
Hemoglobin: 10.7 g/dL — ABNORMAL LOW (ref 12.0–15.0)
MCHC: 32.4 g/dL (ref 30.0–36.0)
MCV: 66.8 fl — ABNORMAL LOW (ref 78.0–100.0)
Monocytes Absolute: 0.4 10*3/uL (ref 0.1–1.0)
Monocytes Relative: 7.7 % (ref 3.0–12.0)
Neutro Abs: 3.5 10*3/uL (ref 1.4–7.7)
Platelets: 368 10*3/uL (ref 150.0–400.0)
RBC: 4.95 Mil/uL (ref 3.87–5.11)

## 2011-01-06 LAB — HEPATIC FUNCTION PANEL
ALT: 18 U/L (ref 0–35)
Albumin: 3.6 g/dL (ref 3.5–5.2)
Alkaline Phosphatase: 118 U/L — ABNORMAL HIGH (ref 39–117)
Total Bilirubin: 0.5 mg/dL (ref 0.3–1.2)
Total Protein: 6.8 g/dL (ref 6.0–8.3)

## 2011-01-06 LAB — HEMOGLOBIN A1C: Hgb A1c MFr Bld: 5.6 % (ref 4.6–6.5)

## 2011-01-06 LAB — LIPID PANEL
Cholesterol: 170 mg/dL (ref 0–200)
HDL: 35.6 mg/dL — ABNORMAL LOW (ref >39.00)
Triglycerides: 117 mg/dL (ref 0.0–149.0)
VLDL: 23.4 mg/dL (ref 0.0–40.0)

## 2011-01-06 LAB — BASIC METABOLIC PANEL
BUN: 11 mg/dL (ref 6–23)
Chloride: 105 mEq/L (ref 96–112)
Creatinine, Ser: 0.8 mg/dL (ref 0.4–1.2)
Potassium: 3.8 mEq/L (ref 3.5–5.1)

## 2011-01-08 ENCOUNTER — Ambulatory Visit (INDEPENDENT_AMBULATORY_CARE_PROVIDER_SITE_OTHER)
Admission: RE | Admit: 2011-01-08 | Discharge: 2011-01-08 | Disposition: A | Discharge status: Home / Self Care | Payer: 59 | Source: Ambulatory Visit | Attending: Pulmonary Disease | Admitting: Pulmonary Disease

## 2011-01-08 ENCOUNTER — Ambulatory Visit: Payer: Self-pay | Admitting: Internal Medicine

## 2011-01-08 ENCOUNTER — Other Ambulatory Visit: Payer: Self-pay | Admitting: Pulmonary Disease

## 2011-01-08 ENCOUNTER — Encounter: Payer: Self-pay | Admitting: Pulmonary Disease

## 2011-01-08 ENCOUNTER — Ambulatory Visit (INDEPENDENT_AMBULATORY_CARE_PROVIDER_SITE_OTHER): Payer: 59 | Admitting: Pulmonary Disease

## 2011-01-08 DIAGNOSIS — D869 Sarcoidosis, unspecified: Secondary | ICD-10-CM

## 2011-01-08 LAB — AFB CULTURE WITH SMEAR (NOT AT ARMC): Acid Fast Smear: NONE SEEN

## 2011-01-08 LAB — CULTURE, RESPIRATORY W GRAM STAIN

## 2011-01-08 LAB — FUNGUS CULTURE W SMEAR

## 2011-01-08 LAB — BODY FLUID CELL COUNT WITH DIFFERENTIAL
Lymphs, Fluid: 51 %
Monocyte-Macrophage-Serous Fluid: 38 % — ABNORMAL LOW (ref 50–90)
Neutrophil Count, Fluid: 11 % (ref 0–25)
Total Nucleated Cell Count, Fluid: 675 cu mm (ref 0–1000)

## 2011-01-09 LAB — URINE MICROSCOPIC-ADD ON

## 2011-01-09 LAB — CBC
Hemoglobin: 8.6 g/dL — ABNORMAL LOW (ref 12.0–15.0)
MCH: 19 pg — ABNORMAL LOW (ref 26.0–34.0)
MCH: 19.5 pg — ABNORMAL LOW (ref 26.0–34.0)
MCHC: 30.8 g/dL (ref 30.0–36.0)
MCV: 61.5 fL — ABNORMAL LOW (ref 78.0–100.0)
MCV: 61.8 fL — ABNORMAL LOW (ref 78.0–100.0)
Platelets: 384 10*3/uL (ref 150–400)
RBC: 4.16 MIL/uL (ref 3.87–5.11)
RBC: 4.41 MIL/uL (ref 3.87–5.11)
RDW: 20.6 % — ABNORMAL HIGH (ref 11.5–15.5)
WBC: 6.8 10*3/uL (ref 4.0–10.5)

## 2011-01-09 LAB — COMPREHENSIVE METABOLIC PANEL
AST: 24 U/L (ref 0–37)
Albumin: 3.1 g/dL — ABNORMAL LOW (ref 3.5–5.2)
BUN: 5 mg/dL — ABNORMAL LOW (ref 6–23)
Calcium: 8.5 mg/dL (ref 8.4–10.5)
Chloride: 107 mEq/L (ref 96–112)
Creatinine, Ser: 0.74 mg/dL (ref 0.4–1.2)
GFR calc Af Amer: >60 mL/min (ref >60)
GFR calc non Af Amer: >60 mL/min (ref >60)
Potassium: 3.8 mEq/L (ref 3.5–5.1)
Total Bilirubin: 0.6 mg/dL (ref 0.3–1.2)
Total Protein: 6.3 g/dL (ref 6.0–8.3)

## 2011-01-09 LAB — RAPID URINE DRUG SCREEN, HOSP PERFORMED
Barbiturates: NOT DETECTED
Benzodiazepines: POSITIVE — AB
Opiates: POSITIVE — AB

## 2011-01-09 LAB — URINALYSIS, ROUTINE W REFLEX MICROSCOPIC
Glucose, UA: NEGATIVE mg/dL
Leukocytes, UA: NEGATIVE
Protein, ur: NEGATIVE mg/dL
pH: 5.5 (ref 5.0–8.0)

## 2011-01-09 LAB — CROSSMATCH

## 2011-01-09 LAB — DIFFERENTIAL
Basophils Absolute: 0.1 10*3/uL (ref 0.0–0.1)
Eosinophils Absolute: 0.3 10*3/uL (ref 0.0–0.7)
Lymphocytes Relative: 12 % (ref 12–46)
Monocytes Absolute: 0.7 10*3/uL (ref 0.1–1.0)
Neutrophils Relative %: 76 % (ref 43–77)

## 2011-01-09 LAB — POCT CARDIAC MARKERS: Myoglobin, poc: 60.6 ng/mL (ref 12–200)

## 2011-01-09 LAB — BASIC METABOLIC PANEL
BUN: 7 mg/dL (ref 6–23)
Calcium: 8.7 mg/dL (ref 8.4–10.5)
Creatinine, Ser: 0.78 mg/dL (ref 0.4–1.2)
GFR calc non Af Amer: >60 mL/min (ref >60)
Glucose, Bld: 96 mg/dL (ref 70–99)

## 2011-01-09 LAB — HEMOCCULT GUIAC POC 1CARD (OFFICE): Fecal Occult Bld: POSITIVE

## 2011-01-09 LAB — PHOSPHORUS: Phosphorus: 3.2 mg/dL (ref 2.3–4.6)

## 2011-01-09 LAB — APTT: aPTT: 28 seconds (ref 24–37)

## 2011-01-09 LAB — TROPONIN I: Troponin I: 0.01 ng/mL (ref 0.00–0.06)

## 2011-01-09 LAB — PROTIME-INR
INR: 1.34 (ref 0.00–1.49)
Prothrombin Time: 16.5 seconds — ABNORMAL HIGH (ref 11.6–15.2)

## 2011-01-09 LAB — CK TOTAL AND CKMB (NOT AT ARMC)
CK, MB: 1.9 ng/mL (ref 0.3–4.0)
Relative Index: 1.6 (ref 0.0–2.5)
Total CK: 116 U/L (ref 7–177)

## 2011-01-09 LAB — CARDIAC PANEL(CRET KIN+CKTOT+MB+TROPI): Total CK: 121 U/L (ref 7–177)

## 2011-01-09 LAB — HEMOGLOBIN A1C
Hgb A1c MFr Bld: 5.7 % — ABNORMAL HIGH (ref <5.7)
Mean Plasma Glucose: 117 mg/dL — ABNORMAL HIGH (ref <117)

## 2011-01-09 LAB — POCT PREGNANCY, URINE: Preg Test, Ur: NEGATIVE

## 2011-01-09 LAB — ABO/RH: ABO/RH(D): B POS

## 2011-01-10 LAB — CBC
HCT: 27.5 % — ABNORMAL LOW (ref 36.0–46.0)
Platelets: 387 10*3/uL (ref 150–400)
RBC: 4.44 MIL/uL (ref 3.87–5.11)
RDW: 20.6 % — ABNORMAL HIGH (ref 11.5–15.5)
WBC: 8.5 10*3/uL (ref 4.0–10.5)

## 2011-01-12 NOTE — Assessment & Plan Note (Signed)
Summary: HEADACHE /NWS   Vital Signs:  Patient profile:   35 year old female Height:      67 inches Weight:      287.50 pounds BMI:     45.19 O2 Sat:      98 % on Room air Temp:     98.6 degrees F oral Pulse rate:   79 / minute BP sitting:   122 / 68  (left arm) Cuff size:   large  Vitals Entered By: Zella Ball Ewing CMA [facility] Dull) (January 06, 2011 2:13 PM)  O2 Flow:  Room air  Preventive Care Screening  Pap Smear:    Date:  01/22/2010    Results:  normal      decleins pneumovax  CC: Headache and sore throat/RE   Primary Care Provider:  [name] MD  CC:  Headache and sore throat/RE.  History of Present Illness: here for wellness and f/u;  incidently with 3 days onset fever, facial pain, generalized HA, pressure, greenish d/c and mild ST;  Pt denies CP, worsening sob, doe, wheezing, orthopnea, pnd, worsening LE edema, palps, dizziness or syncope  Pt denies new neuro symptoms such as  facial or extremity weakness   Pt denies polydipsia, polyuria, or low sugar symptoms such as shakiness improved with eating.  Overall good compliance with meds, trying to follow low chol, DM diet, wt stable, little excercise however  CBG's in the lower 100's. Overall good compliance with meds, and good tolerability. .No recent wt loss, night sweats, loss of appetite or other constitutional symptoms .  Denies worsening depressive symptoms, suicidal ideation, or panic. though has ongoing anxiety, not worse recently.  Pt states good ability with ADL's, low fall risk, home safety reviewed and adequate, no significant change in hearing or vision, trying to follow lower chol diet, and occasionally active only with regular excercise.   Problems Prior to Update: 1)  Preventive Health Care  (ICD-V70.0) 2)  Urticaria  (ICD-708.9) 3)  Depression  (ICD-311) 4)  Pain in Joint, Multiple Sites  (ICD-719.49) 5)  Bronchitis, Acute  (ICD-466.0) 6)  Malaise  (ICD-780.79) 7)  Diabetes Mellitus, Type II  (ICD-250.00) 8)   Peripheral Edema  (ICD-782.3) 9)  Sinusitis- Acute-nos  (ICD-461.9) 10)  Thrush  (ICD-112.0) 11)  Frequency, Urinary  (ICD-788.41) 12)  Epigastric Tenderness  (ICD-789.66) 13)  Insomnia-sleep Disorder-unspec  (ICD-780.52) 14)  Pulmonary Sarcoidosis  (ICD-135) 15)  Dyspnea  (ICD-786.05) 16)  Mediastinal Lymphadenopathy  (ICD-785.6) 17)  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) 18)  Vaginitis, Candidal  (ICD-112.1) 19)  Diarrhea, Acute  (ICD-787.91) 20)  Menorrhagia  (ICD-626.2) 21)  Pneumonia  (ICD-486) 22)  Abdominal Pain, Lower  (ICD-789.09) 23)  Fever Unspecified  (ICD-780.60) 24)  Otitis Media, Acute, Bilateral  (ICD-382.9) 25)  Hyperlipidemia  (ICD-272.4) 26)  Glucose Intolerance  (ICD-271.3) 27)  Anemia-iron Deficiency  (ICD-280.9) 28)  Anxiety  (ICD-300.00) 29)  Smoker  (ICD-305.1) 30)  Wheezing  (ICD-786.07) 31)  Migraine Headache  (ICD-346.90) 32)  Gerd  (ICD-530.81) 33)  Peptic Ulcer Disease  (ICD-533.90) 34)  Glucose Intolerance  (ICD-271.3) 35)  Morbid Obesity  (ICD-278.01)  Medications Prior to Update: 1)  Omeprazole 20 Mg Cpdr (Omeprazole) .... 2 By Mouth Once Daily 2)  Meclizine Hcl 12.5 Mg Tabs (Meclizine Hcl) .Marland Kitchen.. 1po Q 6 Hrs As Needed Dizzy 3)  Alprazolam 0.5 Mg Tabs (Alprazolam) .Marland Kitchen.. 1 By Mouth Two Times A Day As Needed 4)  Fluoxetine Hcl 40 Mg Caps (Fluoxetine Hcl) .Marland Kitchen.. 1 By Mouth  Once Daily 5)  Symbicort 160-4.5 Mcg/act Aero (Budesonide-Formoterol Fumarate) .... 2 Puffs Two Times A Day 6)  Proair Hfa 108 (90 Base) Mcg/act Aers (Albuterol Sulfate) .... 2 Puffs Four Times Per Day As Needed 7)  Zolpidem Tartrate 12.5 Mg Cr-Tabs (Zolpidem Tartrate) .Marland Kitchen.. 1 By Mouth At Bedtime As Needed 8)  Onetouch Test  Strp (Glucose Blood) .... Use Asd Four Times Per Day 9)  Lancets  Misc (Lancets) .... Use Asd Four Times Per Day 10)  Janumet 50-500 Mg Tabs (Sitagliptin-Metformin Hcl) .Marland Kitchen.. 1poi Two Times A Day 11)  Ferrous Sulfate 325 (65 Fe) Mg Tbec  (Ferrous Sulfate) .Marland Kitchen.. 1po Two Times A Day 12)  Furosemide 20 Mg Tabs (Furosemide) .... Take 1 Tab By Mouth Every Day 13)  Tylenol Arthritis Pain 650 Mg Cr-Tabs (Acetaminophen) .Marland Kitchen.. 1-2 By Mouth Three Times A Day As Needed 14)  Celebrex 200 Mg Caps (Celecoxib) .... Take 1 Tablet By Mouth Once A Day As Needed 15)  Hydroxyzine Hcl 25 Mg Tabs (Hydroxyzine Hcl) .Marland Kitchen.. 1 - 2 By Mouth Q 6 Hrs As Needed Itching 16)  Levofloxacin 250 Mg Tabs (Levofloxacin) .Marland Kitchen.. 1po Once Daily 17)  Tussionex Pennkinetic Er 10-8 Mg/44ml Lqcr (Hydrocod Polst-Chlorphen Polst) .Marland Kitchen.. 1 Tsp By Mouth Two Times A Day As Needed  Current Medications (verified): 1)  Omeprazole 20 Mg Cpdr (Omeprazole) .... 2 By Mouth Once Daily 2)  Meclizine Hcl 12.5 Mg Tabs (Meclizine Hcl) .Marland Kitchen.. 1po Q 6 Hrs As Needed Dizzy 3)  Alprazolam 0.5 Mg Tabs (Alprazolam) .Marland Kitchen.. 1 By Mouth Two Times A Day As Needed 4)  Fluoxetine Hcl 40 Mg Caps (Fluoxetine Hcl) .Marland Kitchen.. 1 By Mouth Once Daily 5)  Symbicort 160-4.5 Mcg/act Aero (Budesonide-Formoterol Fumarate) .... 2 Puffs Two Times A Day 6)  Proair Hfa 108 (90 Base) Mcg/act Aers (Albuterol Sulfate) .... 2 Puffs Four Times Per Day As Needed 7)  Zolpidem Tartrate 12.5 Mg Cr-Tabs (Zolpidem Tartrate) .Marland Kitchen.. 1 By Mouth At Bedtime As Needed 8)  Onetouch Test  Strp (Glucose Blood) .... Use Asd Four Times Per Day 9)  Lancets  Misc (Lancets) .... Use Asd Four Times Per Day 10)  Janumet 50-500 Mg Tabs (Sitagliptin-Metformin Hcl) .Marland Kitchen.. 1poi Two Times A Day 11)  Ferrous Sulfate 325 (65 Fe) Mg Tbec (Ferrous Sulfate) .Marland Kitchen.. 1po Two Times A Day 12)  Furosemide 20 Mg Tabs (Furosemide) .... Take 1 Tab By Mouth Every Day 13)  Tylenol Arthritis Pain 650 Mg Cr-Tabs (Acetaminophen) .Marland Kitchen.. 1-2 By Mouth Three Times A Day As Needed 14)  Celebrex 200 Mg Caps (Celecoxib) .... Take 1 Tablet By Mouth Once A Day As Needed 15)  Hydroxyzine Hcl 25 Mg Tabs (Hydroxyzine Hcl) .Marland Kitchen.. 1 - 2 By Mouth Q 6 Hrs As Needed Itching 16)  Levofloxacin 250 Mg Tabs  (Levofloxacin) .Marland Kitchen.. 1po Once Daily 17)  Tussionex Pennkinetic Er 10-8 Mg/66ml Lqcr (Hydrocod Polst-Chlorphen Polst) .Marland Kitchen.. 1 Tsp By Mouth Two Times A Day As Needed  Allergies (verified): 1)  ! Pcn 2)  ! Percocet 3)  ! * Zpack  Past History:  Past Medical History: Last updated: 09/30/2010 Morbid Obesity Peptic ulcer disease GERD Migraine Anxiety Anemia-iron deficiency Hyperlipidemia menorrhoagia PNA episode july 2011 GYN:  Dr Rosemary Holms Diabetes mellitus, type II - steroid related Depression sarcoid - including hand per Rheumatology - Dr Dierdre Forth  Past Surgical History: Last updated: 05/05/2010 s/p uterine ablation  April 21, 2010  Family History: Last updated: 09/14/2010 DM HTN  allergies: mother heart disease: mother (m.i.) , father cancer: maternal  aunts ( ovarian, lung, breast, bone)  father with RA  Social History: Last updated: 07/31/2010 pt is single and lives with Dalbert Batman - also pt of [facility] pt has no children. pt works as a Human resources officer. former smoker.  started at age 62.  less than 1 ppd.  quit July 2011. Alcohol use-yes Drug use-no  Risk Factors: Smoking Status: current (12/11/2009)  Review of Systems  The patient denies anorexia, vision loss, decreased hearing, hoarseness, chest pain, syncope, dyspnea on exertion, peripheral edema, prolonged cough, hemoptysis, abdominal pain, melena, hematochezia, severe indigestion/heartburn, hematuria, muscle weakness, suspicious skin lesions, difficulty walking, depression, unusual weight change, abnormal bleeding, enlarged lymph nodes, and angioedema.         all otherwise negative per pt -    Physical Exam  General:  NAD, alert and overweight-appearing;  mild ill  Head:  normocephalic and atraumatic.   Eyes:  vision grossly intact; pupils equal, round and reactive to light.  conjunctiva and lids normal.    Ears:  bilat tm's midl erythema, sinus tender bilat Nose:  nasal dischargemucosal pallor and mucosal  edema.   Mouth:  pharyngeal erythema and fair dentition.   Neck:  supple and cervical lymphadenopathy.   Lungs:  normal respiratory effort and normal breath sounds.   Heart:  normal rate and regular rhythm.   Abdomen:  soft and normal bowel sound;  non-tender.   Msk:  no joint tenderness and no joint swelling.   Extremities:  no edema, no erythema  Neurologic:  strength normal in all extremities and gait normal.   Skin:  color normal and no rashes.   Psych:  not depressed appearing and slightly anxious.     Impression & Recommendations:  Problem # 1:  Preventive Health Care (ICD-V70.0)  Overall doing well, age appropriate education and counseling updated, referral for preventive services and immunizations addressed, dietary counseling and smoking status adressed , most recent labs reviewed I have personally reviewed and have noted 1.The patient's medical and social history 2.Their use of alcohol, tobacco or illicit drugs 3.Their current medications and supplements 4. Functional ability including ADL's, fall risk, home safety risk, hearing & visual impairment  5.Diet and physical activities 6.Evidence for depression or mood disorders The patients weight, height, BMI  have been recorded in the chart I have made referrals, counseling and provided education to the patient based review of the above   Orders: TLB-BMP (Basic Metabolic Panel-BMET) (80048-METABOL) TLB-CBC Platelet - w/Differential (85025-CBCD) TLB-Hepatic/Liver Function Pnl (80076-HEPATIC) TLB-Lipid Panel (80061-LIPID) TLB-TSH (Thyroid Stimulating Hormone) (84443-TSH) TLB-Udip ONLY (81003-UDIP)  Problem # 2:  DIABETES MELLITUS, TYPE II (ICD-250.00)  Her updated medication list for this problem includes:    Janumet 50-500 Mg Tabs (Sitagliptin-metformin hcl) .Marland Kitchen... 1poi two times a day  Labs Reviewed: Creat: 0.7 (09/01/2010)    Reviewed HgBA1c results: 9.9 (07/31/2010)  10.8 (07/07/2010) stable overall by hx and  exam, ok to continue meds/tx as is . /Pt to cont DM diet, excercise, wt control efforts; to check labs today  Orders: TLB-A1C / Hgb A1C (Glycohemoglobin) (83036-A1C) TLB-Microalbumin/Creat Ratio, Urine (82043-MALB)  Problem # 3:  SINUSITIS- ACUTE-NOS (ICD-461.9)  Her updated medication list for this problem includes:    Levofloxacin 250 Mg Tabs (Levofloxacin) .Marland Kitchen... 1po once daily    Tussionex Pennkinetic Er 10-8 Mg/56ml Lqcr (Hydrocod polst-chlorphen polst) .Marland Kitchen... 1 tsp by mouth two times a day as needed with headache, treat as above, f/u any worsening signs or symptoms   Instructed on treatment. Call if symptoms  persist or worsen.   Complete Medication List: 1)  Omeprazole 20 Mg Cpdr (Omeprazole) .... 2 by mouth once daily 2)  Meclizine Hcl 12.5 Mg Tabs (Meclizine hcl) .Marland Kitchen.. 1po q 6 hrs as needed dizzy 3)  Alprazolam 0.5 Mg Tabs (Alprazolam) .Marland Kitchen.. 1 by mouth two times a day as needed 4)  Fluoxetine Hcl 40 Mg Caps (Fluoxetine hcl) .Marland Kitchen.. 1 by mouth once daily 5)  Symbicort 160-4.5 Mcg/act Aero (Budesonide-formoterol fumarate) .... 2 puffs two times a day 6)  Proair Hfa 108 (90 Base) Mcg/act Aers (Albuterol sulfate) .... 2 puffs four times per day as needed 7)  Zolpidem Tartrate 12.5 Mg Cr-tabs (Zolpidem tartrate) .Marland Kitchen.. 1 by mouth at bedtime as needed 8)  Onetouch Test Strp (Glucose blood) .... Use asd four times per day 9)  Lancets Misc (Lancets) .... Use asd four times per day 10)  Janumet 50-500 Mg Tabs (Sitagliptin-metformin hcl) .Marland Kitchen.. 1poi two times a day 11)  Ferrous Sulfate 325 (65 Fe) Mg Tbec (Ferrous sulfate) .Marland Kitchen.. 1po two times a day 12)  Furosemide 20 Mg Tabs (Furosemide) .... Take 1 tab by mouth every day 13)  Tylenol Arthritis Pain 650 Mg Cr-tabs (Acetaminophen) .Marland Kitchen.. 1-2 by mouth three times a day as needed 14)  Celebrex 200 Mg Caps (Celecoxib) .... Take 1 tablet by mouth once a day as needed 15)  Hydroxyzine Hcl 25 Mg Tabs (Hydroxyzine hcl) .Marland Kitchen.. 1 - 2 by mouth q 6 hrs as needed  itching 16)  Levofloxacin 250 Mg Tabs (Levofloxacin) .Marland Kitchen.. 1po once daily 17)  Tussionex Pennkinetic Er 10-8 Mg/22ml Lqcr (Hydrocod polst-chlorphen polst) .Marland Kitchen.. 1 tsp by mouth two times a day as needed  Patient Instructions: 1)  Please take all new medications as prescribed 2)  Continue all previous medications as before this visit  3)  You are given the work note today 4)  Please go to the Lab in the basement for your blood and/or urine tests today 5)  Please call the number on the [facility] Card for results of your testing  6)  Please schedule a follow-up appointment in 6 months with: 7)  BMP prior to visit, ICD-9: 250.02 8)  Lipid Panel prior to visit, ICD-9: 9)  HbgA1C prior to visit, ICD-9: Prescriptions: TUSSIONEX PENNKINETIC ER 10-8 MG/5ML LQCR (HYDROCOD POLST-CHLORPHEN POLST) 1 tsp by mouth two times a day as needed  #6oz x 0   Entered and Authorized by:   [name] MD   Signed by:   [name] MD on 01/06/2011   Method used:   Print then Give to Patient   RxID:   [id number] LEVOFLOXACIN 250 MG TABS (LEVOFLOXACIN) 1po once daily  #10 x 0   Entered and Authorized by:   [name] MD   Signed by:   [name] MD on 01/06/2011   Method used:   Print then Give to Patient   RxID:   [id number]    Orders Added: 1)  TLB-A1C / Hgb A1C (Glycohemoglobin) [83036-A1C] 2)  TLB-Microalbumin/Creat Ratio, Urine [82043-MALB] 3)  TLB-BMP (Basic Metabolic Panel-BMET) [80048-METABOL] 4)  TLB-CBC Platelet - w/Differential [85025-CBCD] 5)  TLB-Hepatic/Liver Function Pnl [80076-HEPATIC] 6)  TLB-Lipid Panel [80061-LIPID] 7)  TLB-TSH (Thyroid Stimulating Hormone) [84443-TSH] 8)  TLB-Udip ONLY [81003-UDIP] 9)  Est. Patient 18-39 years [02725]

## 2011-01-12 NOTE — Letter (Signed)
Summary: Out of Work  [facility]  [address] [place], [place] [address]   Phone: [phone]  Fax: [phone]    January 06, 2011   Employee:  PARRIS SIGNER Tiller    To Whom It May Concern:   For Medical reasons, please excuse the above named employee from work for the following dates:  Start:   Jan 06, 2011  End:   Jan 06, 2011   -    OK to return to work later today Jan 06, 2011 without restriction  If you need additional information, please feel free to contact our office.         Sincerely,    [name] MD

## 2011-01-18 ENCOUNTER — Ambulatory Visit (INDEPENDENT_AMBULATORY_CARE_PROVIDER_SITE_OTHER): Payer: 59 | Admitting: Pulmonary Disease

## 2011-01-18 DIAGNOSIS — R0602 Shortness of breath: Secondary | ICD-10-CM

## 2011-01-18 DIAGNOSIS — D869 Sarcoidosis, unspecified: Secondary | ICD-10-CM

## 2011-01-18 LAB — PULMONARY FUNCTION TEST

## 2011-01-18 NOTE — Progress Notes (Signed)
PFT done today. 

## 2011-01-21 ENCOUNTER — Encounter: Payer: Self-pay | Admitting: Pulmonary Disease

## 2011-01-21 ENCOUNTER — Telehealth: Payer: Self-pay | Admitting: Pulmonary Disease

## 2011-01-21 NOTE — Telephone Encounter (Signed)
Called and spoke with pt.  Pt scheduled to see [facility] tomorrow at 9:45am.

## 2011-01-21 NOTE — Telephone Encounter (Signed)
Pt's pfts show severe restriction and severe decrease in DLCO.  Given this and abnormal cxr with symptoms, would consider treating her sarcoidosis to see if things improve.  She has been intolerant of prednisone due to very severe hyperglycemia, and would like to try her on MTX with low dose prednisone to see how she does    Nicole Melendez, please have pt come in to discuss pfts results, and possible treatment options for her sarcoid.

## 2011-01-21 NOTE — Assessment & Plan Note (Signed)
Summary: rov for sarcoid, ?recurrent pulmonary infxn   Copy to:  [name] Primary Provider/Referring Provider:  [name] MD  CC:  Routine f/u appt.   Pt states breathing is unchanged from last visit.  Pt states her pcp recently dx her with ear infection and bronchitis.  Pt c/o coughing up yellow to green sputum, ear ache, and sore throat and head/nasal congestion. Marland Kitchen  History of Present Illness: The pt comes in today for f/u of her multiple respiratory issues.  She has known sarcoidosis, and have been treating with symbicort for airway symptoms.  She has not had pfts either due to noncompliance with f/u visits or ?recurrent pulmonary infections.  It has been unclear how much sarcoid is playing a role in any of her symptoms, and she was tried on a short course of prednisone to help sort this out.  She has severe hyperglycemia related to this, and was intolerant of even low to moderate doses.  She has not been seen since Nov, even though she reports multiple respiratory infections that by history and documentation have been more upper respiratory in nature.  She also has been having joint issues, and I have reviewed the available rheum records which sound unclear whether they think this is sarcoid or not.  She currently has been treated for otitis/bronchitis.  She is not staying on her PPI compliantly, and admits to having reflux symptoms.   Medications Prior to Update: 1)  Omeprazole 20 Mg Cpdr (Omeprazole) .... 2 By Mouth Once Daily 2)  Meclizine Hcl 12.5 Mg Tabs (Meclizine Hcl) .Marland Kitchen.. 1po Q 6 Hrs As Needed Dizzy 3)  Alprazolam 0.5 Mg Tabs (Alprazolam) .Marland Kitchen.. 1 By Mouth Two Times A Day As Needed 4)  Fluoxetine Hcl 40 Mg Caps (Fluoxetine Hcl) .Marland Kitchen.. 1 By Mouth Once Daily 5)  Symbicort 160-4.5 Mcg/act Aero (Budesonide-Formoterol Fumarate) .... 2 Puffs Two Times A Day 6)  Proair Hfa 108 (90 Base) Mcg/act Aers (Albuterol Sulfate) .... 2 Puffs Four Times Per Day As Needed 7)  Zolpidem Tartrate 12.5 Mg  Cr-Tabs (Zolpidem Tartrate) .Marland Kitchen.. 1 By Mouth At Bedtime As Needed 8)  Onetouch Test  Strp (Glucose Blood) .... Use Asd Four Times Per Day 9)  Lancets  Misc (Lancets) .... Use Asd Four Times Per Day 10)  Janumet 50-500 Mg Tabs (Sitagliptin-Metformin Hcl) .Marland Kitchen.. 1poi Two Times A Day 11)  Ferrous Sulfate 325 (65 Fe) Mg Tbec (Ferrous Sulfate) .Marland Kitchen.. 1po Two Times A Day 12)  Furosemide 20 Mg Tabs (Furosemide) .... Take 1 Tab By Mouth Every Day 13)  Tylenol Arthritis Pain 650 Mg Cr-Tabs (Acetaminophen) .Marland Kitchen.. 1-2 By Mouth Three Times A Day As Needed 14)  Celebrex 200 Mg Caps (Celecoxib) .... Take 1 Tablet By Mouth Once A Day As Needed 15)  Hydroxyzine Hcl 25 Mg Tabs (Hydroxyzine Hcl) .Marland Kitchen.. 1 - 2 By Mouth Q 6 Hrs As Needed Itching 16)  Levofloxacin 250 Mg Tabs (Levofloxacin) .Marland Kitchen.. 1po Once Daily 17)  Tussionex Pennkinetic Er 10-8 Mg/76ml Lqcr (Hydrocod Polst-Chlorphen Polst) .Marland Kitchen.. 1 Tsp By Mouth Two Times A Day As Needed  Allergies (verified): 1)  ! Pcn 2)  ! Percocet 3)  ! * Zpack  Past History:  Past medical, surgical, family and social histories (including risk factors) reviewed, and no changes noted (except as noted below).  Past Medical History: Reviewed history from 09/30/2010 and no changes required. 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 s/p uterine ablation  April 21, 2010  Family History: Reviewed history from 09/14/2010 and no changes required. DM HTN  allergies: mother heart disease: mother (m.i.) , father cancer: maternal aunts ( ovarian, lung, breast, bone)  father with RA  Social History: Reviewed history from 07/31/2010 and no changes required. pt is single and lives with  Dalbert Batman - also pt of [facility] pt has no children. pt works as a Human resources officer. former smoker.  started at age 42.  less than 1 ppd.  quit July 2011. Alcohol use-yes Drug use-no  Review of Systems       The patient complains of productive cough, sore throat, ear ache, joint stiffness or pain, and change in color of mucus.  The patient denies shortness of breath with activity, shortness of breath at rest, non-productive cough, coughing up blood, chest pain, irregular heartbeats, acid heartburn, indigestion, loss of appetite, weight change, abdominal pain, difficulty swallowing, tooth/dental problems, headaches, nasal congestion/difficulty breathing through nose, sneezing, itching, anxiety, depression, hand/feet swelling, rash, and fever.    Vital Signs:  Patient profile:   35 year old female Height:      67 inches Weight:      288.25 pounds BMI:     45.31 O2 Sat:      98 % on Room air Temp:     98.2 degrees F oral Pulse rate:   83 / minute BP sitting:   114 / 78  (left arm) Cuff size:   large  Vitals Entered By: Arman Filter LPN (January 08, 2011 10:06 AM)  O2 Flow:  Room air CC: Routine f/u appt.   Pt states breathing is unchanged from last visit.  Pt states her pcp recently dx her with ear infection and bronchitis.  Pt c/o coughing up yellow to green sputum, ear ache, sore throat and head/nasal congestion.  Comments Medications reviewed with patient Arman Filter LPN  January 08, 2011 10:09 AM    Physical Exam  General:  obese female in nad  Nose:  no purulence or discharge seen  Lungs:  totally clear to ausculation  Heart:  rrr, no mrg  Extremities:  no significant edema or cyanosis  Neurologic:  alert and oriented, moves all 4    Impression & Recommendations:  Problem # 1:  PULMONARY SARCOIDOSIS (ICD-135) the pt has been having ongoing pulmonary symptoms, but is back to baseline currently. I have not seen her since Nov., and it is unclear if this is due to her sarcoid  flaring or possibly true ongoing pulmonary infections? She denies any sinus disease symptoms, but does have reflux and has not been compliant with her PPI.  I have reminded her that LPR can result in recurrent pulmonary infections.  If this is due to her sarcoid, will need to start mtx due to her steroid intolerance (severe hyperglycemia).  Would like her to stay compliant with PPI, and also finally get pfts for evaluation.  Will decide at that point whether to consider a trial of mtx.    Other Orders: Est. Patient Level III (16109) T-2 View CXR (71020TC) Pulmonary Referral (Pulmonary)  Patient Instructions: 1)  continue on symbicort 2)  take omeprazole for reflux everyday!! 3)  will check cxr today and schedule for breathing studies.  I will call you with results, and talk about future  plans 4)  schedule followup with me in 3mos, but I need to see you if you get sick to help sort thru the issues.

## 2011-01-22 ENCOUNTER — Encounter: Payer: Self-pay | Admitting: Pulmonary Disease

## 2011-01-22 ENCOUNTER — Telehealth: Payer: Self-pay | Admitting: Pulmonary Disease

## 2011-01-22 ENCOUNTER — Ambulatory Visit (INDEPENDENT_AMBULATORY_CARE_PROVIDER_SITE_OTHER): Payer: 59 | Admitting: Pulmonary Disease

## 2011-01-22 VITALS — BP 120/74 | HR 100 | Temp 98.2°F | Ht 67.0 in | Wt 291.2 lb

## 2011-01-22 DIAGNOSIS — D869 Sarcoidosis, unspecified: Secondary | ICD-10-CM

## 2011-01-22 DIAGNOSIS — T887XXA Unspecified adverse effect of drug or medicament, initial encounter: Secondary | ICD-10-CM

## 2011-01-22 MED ORDER — METHOTREXATE SODIUM 10 MG PO TABS: 10.0000 mg | ORAL_TABLET | ORAL | Status: DC

## 2011-01-22 MED ORDER — PREDNISONE 10 MG PO TABS: ORAL_TABLET | ORAL | Status: DC

## 2011-01-22 NOTE — Patient Instructions (Signed)
Stay on symbicort and meds for reflux Can try chlorpheniramine over the counter 8mg  at bedtime and afternoon if needed. Will start methotrexate 10mg  weekly, along with prednisone 10mg  a day.   Will need to check liver tests and blood count every 2 weeks until next visit  followup with me in 4weeks.

## 2011-01-22 NOTE — Telephone Encounter (Signed)
Megan, please make sure pt knows that she cannot take MTX if she is pregnant, and she is to call me immediately and stop med if she gets pregnant.

## 2011-01-22 NOTE — Assessment & Plan Note (Signed)
The pt has known sarcoidosis, has parenchymal infiltrates on cxr, and has abnormal pfts.  Her primary symptoms are cough, that may just be upper airway in origin, as well as doe.  However, she is morbidly obese and clearly deconditioned.  It is unclear whether sarcoid is playing a significant role in her symptomatology or not, but she does have abnormal findings that could be explained by active sarcoid.  She did not tolerate prednisone at moderate to high doses, due to severe hyperglycemia.  I would try and treat her again with MTX and low dose prednisone.  I have discussed the potential liver, lung, and bone marrow toxicity with her.  She understands we need to monitor her lfts and cbc, and is agreeable to doing this.  If she does not see a considerable improvement in 4-8  Wks, would d/c.

## 2011-01-22 NOTE — Progress Notes (Signed)
  Subjective:    Patient ID: Nicole Melendez, female    DOB: 1976/01/07, 35 y.o.   MRN: [id number]  HPI    Review of Systems  Constitutional: Positive for fever. Negative for appetite change and unexpected weight change.  HENT: Positive for ear pain, congestion, rhinorrhea and postnasal drip. Negative for sore throat, sneezing, trouble swallowing and dental problem.   Respiratory: Positive for cough, shortness of breath and wheezing. Negative for chest tightness.   Cardiovascular: Positive for chest pain and leg swelling. Negative for palpitations.  Gastrointestinal: Positive for nausea and diarrhea. Negative for abdominal pain.  Genitourinary: Negative for dysuria.  Musculoskeletal: Negative for joint swelling.  Skin: Negative for rash.  Neurological: Positive for headaches. Negative for syncope.  Hematological: Does not bruise/bleed easily.  Psychiatric/Behavioral: Negative for dysphoric mood. The patient is not nervous/anxious.        Objective:   Physical Exam Obese female in nad  Nares patent without discharge, no purulence Chest fairly clear, no wheezing or rhonchi Cor with rrr, no mrg Minimal edema, no cyanosis Alert and oriented, moves all 4        Assessment & Plan:

## 2011-01-25 NOTE — Telephone Encounter (Signed)
[id number] 

## 2011-01-26 NOTE — Telephone Encounter (Signed)
Pt called back and pt states she is not pregnant. Pt is aware to stop mtx if she does become pregnant. Pt verbalized understanding

## 2011-01-26 NOTE — Telephone Encounter (Signed)
Patient phoned stated that she was returning a call to [facility]. She can be reached at [phone].Vedia Coffer

## 2011-02-03 ENCOUNTER — Encounter: Payer: Self-pay | Admitting: *Deleted

## 2011-02-03 ENCOUNTER — Ambulatory Visit (INDEPENDENT_AMBULATORY_CARE_PROVIDER_SITE_OTHER): Payer: 59 | Admitting: Pulmonary Disease

## 2011-02-03 ENCOUNTER — Encounter: Payer: Self-pay | Admitting: Pulmonary Disease

## 2011-02-03 VITALS — BP 112/86 | HR 78 | Temp 99.0°F | Ht 67.0 in | Wt 288.2 lb

## 2011-02-03 DIAGNOSIS — D869 Sarcoidosis, unspecified: Secondary | ICD-10-CM

## 2011-02-03 MED ORDER — PROMETHAZINE HCL 12.5 MG PO TABS: 12.5000 mg | ORAL_TABLET | Freq: Four times a day (QID) | ORAL | Status: DC | PRN

## 2011-02-03 MED ORDER — NYSTATIN 100000 UNIT/ML MT SUSP: OROMUCOSAL | Status: DC

## 2011-02-03 NOTE — Progress Notes (Signed)
  Subjective:    Patient ID: Nicole Melendez, female    DOB: 27-Dec-1975, 35 y.o.   MRN: [id number]  HPI The pt comes in today for an acute sick visit.  She was started on mtx with low dose prednisone last visit, but had issues with nausea and abd. Discomfort, and had to discontinue.  She notes that while taking this, she saw a considerable improvement in her breathing and cough.  She also is having issues with thrush, and is on symbicort as well.    Review of Systems  Constitutional: Negative for fever and unexpected weight change.  HENT: Positive for sore throat, rhinorrhea and trouble swallowing. Negative for ear pain, nosebleeds, congestion, sneezing, dental problem, postnasal drip and sinus pressure.   Eyes: Positive for itching. Negative for redness.  Respiratory: Positive for cough. Negative for chest tightness, shortness of breath and wheezing.   Cardiovascular: Negative for palpitations and leg swelling.  Gastrointestinal: Positive for nausea, vomiting, abdominal pain and diarrhea.  Genitourinary: Negative for dysuria.  Musculoskeletal: Negative for joint swelling.  Skin: Negative for rash.  Neurological: Positive for headaches.  Hematological: Does not bruise/bleed easily.  Psychiatric/Behavioral: Negative for dysphoric mood. The patient is not nervous/anxious.        Objective:   Physical Exam Obese female in nad No purulence or discharge from nares OP with thrush, no lesions seen Chest with clear bs, no wheezing or rhonchi Cor with rrr, no mrg LE without edema, no cyanosis  Alert and oriented, moves all 4        Assessment & Plan:

## 2011-02-03 NOTE — Patient Instructions (Signed)
Make sure to rinse, gargle, and swallow after using symbicort Stay on prednisone 10mg  a day for now Stay off methotrexate for next 2 weeks, then restart back at 5mg  weekly for 2 weeks, then 7.5mg  weekly for 2 weeks, then 10mg  weekly thereafter.  You can split the dose on the day you take the medication.  Let me know if you continue to have GI issues.  Do not forget to get your labwork done once you start back on methotrexate Phenergan 12.5mg  one every 6hrs if needed for nausea.  Will make you sleepy Nystatin mouthwash as directed. Change followup appt with me to 4 weeks.

## 2011-02-04 ENCOUNTER — Encounter: Payer: Self-pay | Admitting: Pulmonary Disease

## 2011-02-06 NOTE — Assessment & Plan Note (Signed)
The pt is having probable side effects from mtx, but did notice considerable improvement in her breathing, cough, joint complaints with the therapeutic trial.  She wishes to keep trying the medication, but I will use lower doses to start with to see if she can tolerate.  She will call if she is continuing to have issues with tolerance, and will keep up with lab checks to monitor for toxicity.

## 2011-02-16 ENCOUNTER — Other Ambulatory Visit: Payer: Self-pay | Admitting: Internal Medicine

## 2011-02-17 ENCOUNTER — Encounter: Payer: Self-pay | Admitting: Internal Medicine

## 2011-02-17 ENCOUNTER — Ambulatory Visit (INDEPENDENT_AMBULATORY_CARE_PROVIDER_SITE_OTHER): Payer: 59 | Admitting: Internal Medicine

## 2011-02-17 VITALS — BP 120/70 | HR 96 | Temp 98.5°F | Ht 67.0 in | Wt 290.5 lb

## 2011-02-17 DIAGNOSIS — J019 Acute sinusitis, unspecified: Secondary | ICD-10-CM

## 2011-02-17 DIAGNOSIS — J309 Allergic rhinitis, unspecified: Secondary | ICD-10-CM

## 2011-02-17 DIAGNOSIS — I83819 Varicose veins of unspecified lower extremities with pain: Secondary | ICD-10-CM

## 2011-02-17 DIAGNOSIS — B373 Candidiasis of vulva and vagina: Secondary | ICD-10-CM

## 2011-02-17 DIAGNOSIS — E119 Type 2 diabetes mellitus without complications: Secondary | ICD-10-CM

## 2011-02-17 DIAGNOSIS — B3731 Acute candidiasis of vulva and vagina: Secondary | ICD-10-CM

## 2011-02-17 DIAGNOSIS — I868 Varicose veins of other specified sites: Secondary | ICD-10-CM

## 2011-02-17 DIAGNOSIS — D509 Iron deficiency anemia, unspecified: Secondary | ICD-10-CM

## 2011-02-17 MED ORDER — FLUTICASONE PROPIONATE 50 MCG/ACT NA SUSP: 2.0000 | Freq: Every day | NASAL | Status: DC

## 2011-02-17 MED ORDER — FLUCONAZOLE 150 MG PO TABS: ORAL_TABLET | ORAL | Status: AC

## 2011-02-17 MED ORDER — SULFAMETHOXAZOLE-TRIMETHOPRIM 800-160 MG PO TABS: 1.0000 | ORAL_TABLET | Freq: Two times a day (BID) | ORAL | Status: AC

## 2011-02-17 NOTE — Assessment & Plan Note (Signed)
stable overall by hx and exam, most recent lab reviewed with pt, and pt to continue medical treatment as before  Lab Results  Component Value Date   HGBA1C 5.6 01/06/2011

## 2011-02-17 NOTE — Assessment & Plan Note (Signed)
With current seasonal worsening ;  To cont the zyrtec as is, but add the flonase asd;  Consider allergy referral if not improved

## 2011-02-17 NOTE — Assessment & Plan Note (Addendum)
Roommate has current boil with recurrent boils as well;  Cant r/o MRSA;  For septra ds bid - tx with antibx course,  to f/u any worsening symptoms or concerns

## 2011-02-17 NOTE — Telephone Encounter (Signed)
Faxed hardcopy to pharmacy. 

## 2011-02-17 NOTE — Assessment & Plan Note (Addendum)
Mild symptoms, for diflucan given the current sinusitis per pt request prn symptoms

## 2011-02-17 NOTE — Patient Instructions (Addendum)
Take all new medications as prescribed Continue all other medications as before You will be contacted regarding the referral for: vein clinic Please return in Sept 2012 with Lab testing done 3-5 days before

## 2011-02-19 ENCOUNTER — Ambulatory Visit: Payer: 59 | Admitting: Pulmonary Disease

## 2011-02-21 ENCOUNTER — Encounter: Payer: Self-pay | Admitting: Internal Medicine

## 2011-02-21 DIAGNOSIS — Z0000 Encounter for general adult medical examination without abnormal findings: Secondary | ICD-10-CM | POA: Insufficient documentation

## 2011-02-21 DIAGNOSIS — Z0001 Encounter for general adult medical examination with abnormal findings: Secondary | ICD-10-CM | POA: Insufficient documentation

## 2011-02-21 NOTE — Progress Notes (Signed)
Subjective:    Patient ID: Nicole Melendez, female    DOB: Mar 06, 1976, 35 y.o.   MRN: [id number]  HPI   Here with 3 days acute onset fever, facial pain, pressure, general weakness and malaise, and greenish d/c, with slight ST, but little to no cough and Pt denies chest pain, increased sob or doe, wheezing, orthopnea, PND, increased LE swelling, palpitations, dizziness or syncope.  Does have several wks ongoing nasal allergy symptoms with clear congestion, itch and sneeze, without fever, pain, ST, cough or wheezing, and zyrtec not working well enough.  Pt denies polydipsia, polyuria, or low sugar symptoms such as weakness or confusion improved with po intake.  Pt states overall good compliance with meds, trying to follow lower cholesterol, diabetic diet, wt overall stable but little exercise however.   Pt denies chest pain, increased sob or doe, wheezing, orthopnea, PND, increased LE swelling, palpitations, dizziness or syncope.  Pt denies new neurological symptoms such as new headache, or facial or extremity weakness or numbness   Does also have varicose vein that seem to be tender to the lower extremities without active phlebitis and wonders if this can be treated, Past Medical History  Diagnosis Date  . Morbid obesity   . Peptic ulcer disease   . GERD (gastroesophageal reflux disease)   . Migraine   . Anxiety   . Anemia   . Hyperlipidemia   . Menorrhagia   . PNA (pneumonia) july 2011  . Diabetes mellitus type II     steroid related  . Depression   . Sarcoid     including hand per rheumatology-Dr. Dierdre Forth  . Allergic rhinitis, cause unspecified 02/17/2011  . Varicose veins with pain 02/17/2011   Past Surgical History  Procedure Date  . Uterine ablation 03/2010    reports that she quit smoking about 9 months ago. Her smoking use included Cigarettes. She has a 20 pack-year smoking history. She does not have any smokeless tobacco history on file. She reports that she drinks alcohol. She  reports that she does not use illicit drugs. family history includes Allergies in her mother; Bone cancer in her maternal aunt; Breast cancer in her maternal aunt; Diabetes in an unspecified family member; Heart attack in her mother; Heart disease in her father; Hypertension in an unspecified family member; Lung cancer in her maternal aunt; Ovarian cancer in her maternal aunt; and Rheum arthritis in her father. Allergies  Allergen Reactions  . Azithromycin     (z pak) hives  . Oxycodone-Acetaminophen     REACTION: difficulty breathing  . Penicillins     REACTION: swelling and difficulty breathing   Current Outpatient Prescriptions on File Prior to Visit  Medication Sig Dispense Refill  . acetaminophen (TYLENOL) 650 MG CR tablet 1-2 tablets by mouth 3 times a day as needed       . albuterol (PROAIR HFA) 108 (90 BASE) MCG/ACT inhaler 2 puffs up to 4 times a day as needed       . ALPRAZolam (XANAX) 0.5 MG tablet 1 by mouth 2 times a day as needed      . B-D UF III MINI PEN NEEDLES 31G X 5 MM MISC       . budesonide-formoterol (SYMBICORT) 160-4.5 MCG/ACT inhaler Inhale 2 puffs into the lungs 2 (two) times daily.        Marland Kitchen FLUoxetine (PROZAC) 40 MG capsule Once a day      . furosemide (LASIX) 20 MG tablet Once a day       .  hydrOXYzine (ATARAX) 25 MG tablet 1-2 by mouth every 6 hrs as needed for itching       . JANUMET 50-500 MG per tablet 1 twice a day      . methotrexate (RHEUMATREX) 2.5 MG tablet Take 4 tabs by mouth once a week.       Marland Kitchen NOVOLOG FLEXPEN 100 UNIT/ML injection       . omeprazole (PRILOSEC) 20 MG capsule 2 once a day       . ONE TOUCH ULTRA TEST test strip       . ONETOUCH DELICA LANCETS MISC       . predniSONE (DELTASONE) 10 MG tablet Take 10 mg by mouth daily.        . CELEBREX 200 MG capsule Once a day as needed      . cetirizine (ZYRTEC) 10 MG tablet Take 10 mg by mouth daily.        . ferrous sulfate 325 (65 FE) MG tablet 1 twice a day       . nystatin (MYCOSTATIN)  100000 UNIT/ML suspension 4 ml swish and swallow three times a day for 5 days.  60 mL  0  . zolpidem (AMBIEN CR) 12.5 MG CR tablet TAKE ONE TABLET BY MOUTH AT BEDTIME AS NEEDED  30 tablet  5   Review of Systems Review of Systems  Constitutional: Negative for diaphoresis and unexpected weight change.  HENT: Negative for drooling and tinnitus.   Eyes: Negative for photophobia and visual disturbance.  Respiratory: Negative for choking and stridor.   Gastrointestinal: Negative for vomiting and blood in stool.  Genitourinary: Negative for hematuria and decreased urine volume.  Musculoskeletal: Negative for gait problem.  Does have occasional leg cramps Skin: Negative for color change and wound.  Neurological: Negative for tremors and numbness.  Psychiatric/Behavioral: Negative for decreased concentration. The patient is not hyperactive.       Objective:   Physical Exam BP 120/70  Pulse 96  Temp(Src) 98.5 F (36.9 C) (Oral)  Ht 5\' 7"  (1.702 m)  Wt 290 lb 8 oz (131.77 kg)  BMI 45.50 kg/m2  SpO2 98%  LMP 02/05/2011 Physical Exam  VS noted, mild ill  Constitutional: Pt appears well-developed and well-nourished.  HENT: Head: Normocephalic.  Bilat tm's mild erythema.  Sinus tender.  Pharynx mild erythema without exudate Right Ear: External ear normal.  Left Ear: External ear normal.  Eyes: Conjunctivae and EOM are normal. Pupils are equal, round, and reactive to light.  Neck: Normal range of motion. Neck supple.  Cardiovascular: Normal rate and regular rhythm.   Pulmonary/Chest: Effort normal and breath sounds normal.  Neurological: Pt is alert. No cranial nerve deficit.  Skin: Skin is warm. No erythema. Several tender but nonphlebitic veins noted to LE's Psychiatric: Pt behavior is normal. Thought content normal.         Assessment & Plan:

## 2011-02-21 NOTE — Assessment & Plan Note (Signed)
No longer taking the iron;  No overt bleeding,  stable overall by hx and exam, most recent lab reviewed with pt, and pt to continue medical treatment as before  Lab Results  Component Value Date   WBC 5.4 01/06/2011   HGB 10.7* 01/06/2011   HCT 33.1* 01/06/2011   PLT 368.0 01/06/2011   CHOL 170 01/06/2011   TRIG 117.0 01/06/2011   HDL 35.60* 01/06/2011   ALT 18 01/06/2011   AST 24 01/06/2011   NA 137 01/06/2011   K 3.8 01/06/2011   CL 105 01/06/2011   CREATININE 0.8 01/06/2011   BUN 11 01/06/2011   CO2 26 01/06/2011   TSH 2.23 01/06/2011   INR 1.34 05/13/2010   HGBA1C 5.6 01/06/2011

## 2011-03-05 ENCOUNTER — Ambulatory Visit (INDEPENDENT_AMBULATORY_CARE_PROVIDER_SITE_OTHER): Payer: 59 | Admitting: Pulmonary Disease

## 2011-03-05 ENCOUNTER — Encounter: Payer: Self-pay | Admitting: Pulmonary Disease

## 2011-03-05 VITALS — BP 100/80 | HR 94 | Temp 98.5°F | Ht 67.0 in | Wt 292.4 lb

## 2011-03-05 DIAGNOSIS — D869 Sarcoidosis, unspecified: Secondary | ICD-10-CM

## 2011-03-05 MED ORDER — METHOTREXATE 2.5 MG PO TABS: ORAL_TABLET | ORAL | Status: DC

## 2011-03-05 NOTE — Progress Notes (Signed)
  Subjective:    Patient ID: Nicole Melendez, female    DOB: December 11, 1975, 35 y.o.   MRN: [id number]  HPI The pt comes in today for f/u of her known sarcoidosis.  She is being treated with mtx and low dose prednisone, and feels she is much better. Cough and dyspnea improved.  She is tolerating the lower dose mtx, and will go to the 10mg /weekly next week.  She is also due for her bloodwork next week.    Review of Systems  Constitutional: Positive for unexpected weight change. Negative for fever.  HENT: Positive for rhinorrhea, sneezing and dental problem. Negative for ear pain, nosebleeds, congestion, sore throat, trouble swallowing, postnasal drip and sinus pressure.   Eyes: Negative for redness and itching.  Respiratory: Positive for cough. Negative for chest tightness, shortness of breath and wheezing.   Cardiovascular: Negative for palpitations and leg swelling.  Gastrointestinal: Negative for nausea and vomiting.  Genitourinary: Negative for dysuria.  Musculoskeletal: Negative for joint swelling.  Skin: Negative for rash.  Neurological: Positive for headaches.  Hematological: Bruises/bleeds easily.  Psychiatric/Behavioral: Negative for dysphoric mood. The patient is not nervous/anxious.        Objective:   Physical Exam Obese female in nad No purulence or discharge from nares Chest clear to auscultation Cor with rrr LE with mild edema, no cyanosis  Alert, oriented, moves all 4        Assessment & Plan:

## 2011-03-05 NOTE — Patient Instructions (Signed)
Increase methotrexate as directed to 10mg  a week starting next week. No change in prednisone dosing Make sure you get your bloodwork Monday. Will see you back in 4weeks, and check cxr same day.

## 2011-03-05 NOTE — Assessment & Plan Note (Signed)
The pt feels she is doing better on mtx/pred.  She has less cough and sob is improved.  She feels better as well.  She is tolerating 7.5mg  a week currently without GI side effects, and is to go to 10mg  a week starting next week.  She knows to come for bloodwork next week as well.  She is to f/u with me in 4 weeks, and will check cxr next visit.

## 2011-03-08 ENCOUNTER — Other Ambulatory Visit (INDEPENDENT_AMBULATORY_CARE_PROVIDER_SITE_OTHER): Payer: 59

## 2011-03-08 DIAGNOSIS — T887XXA Unspecified adverse effect of drug or medicament, initial encounter: Secondary | ICD-10-CM

## 2011-03-08 LAB — CBC WITH DIFFERENTIAL/PLATELET
Basophils Absolute: 0 10*3/uL (ref 0.0–0.1)
Basophils Relative: 0.4 % (ref 0.0–3.0)
Eosinophils Absolute: 0.1 10*3/uL (ref 0.0–0.7)
Lymphocytes Relative: 11.5 % — ABNORMAL LOW (ref 12.0–46.0)
MCHC: 32.8 g/dL (ref 30.0–36.0)
MCV: 66.8 fl — ABNORMAL LOW (ref 78.0–100.0)
Monocytes Absolute: 0.7 10*3/uL (ref 0.1–1.0)
Neutrophils Relative %: 78.1 % — ABNORMAL HIGH (ref 43.0–77.0)
Platelets: 364 10*3/uL (ref 150.0–400.0)
RBC: 4.79 Mil/uL (ref 3.87–5.11)
RDW: 19.8 % — ABNORMAL HIGH (ref 11.5–14.6)

## 2011-03-08 LAB — HEPATIC FUNCTION PANEL
ALT: 19 U/L (ref 0–35)
AST: 18 U/L (ref 0–37)
Bilirubin, Direct: 0 mg/dL (ref 0.0–0.3)
Total Protein: 6.9 g/dL (ref 6.0–8.3)

## 2011-03-09 NOTE — Group Therapy Note (Signed)
NAME:  Nicole Melendez, Nicole Melendez               ACCOUNT NO.:  [id number]   MEDICAL RECORD NO.:  [id number]          PATIENT TYPE:  WOC   LOCATION:  [facility]                   FACILITY:  [facility]   PHYSICIAN:  Johnella Moloney, MD        DATE OF BIRTH:  [birth year]   DATE OF SERVICE:                                  CLINIC NOTE   CHIEF COMPLAINT:  Menometrorrhagia.   HISTORY OF PRESENT ILLNESS:  The patient is a 35 year old G0 who  presents with menometrorrhagia.  The patient was last seen in MAU on  August 30, 2007 for bleeding for over 20 days and changing pads every 2  to 3 hours.  She was evaluated in MAU and found to have a hematocrit of  26 and a pelvic ultrasound showed a 4.9-cm broad based subserosal  fibroid at the lower uterine segment at the right of midline.  Overall,  uterus size was 9.9 x 4.9 x 5.5 cm.  Endometrial stripe was homogenous  measuring 8 to 9 mm and normal adnexa.  The patient was told to follow  up here for further evaluation.  Of note, she was given a prescription  for Ortho Tri-Cyclen Lo which patient has been taking since her MAU  encounter.  Today, the patient reports that she continues to bleed even  though she is taking the Ortho Tri-Cyclen Lo.  She also experiences some  lower abdominal pain with this bleeding.  She wants to know if there is  anything else that can be done to stop this bleeding.  Patient reports  taking 800 of ibuprofen a day which does not help with this pain.   PAST OB-GYN HISTORY:  G0.  Her last menstrual period was on October 17.  Menarche at age 35.  She has had regular menstrual cycles prior to last  month.  Patient is currently on Ortho Tri-Cyclen Lo.  She had her last  Pap smear in 2005.  She does not have any history of abnormal Pap  smears.   PAST MEDICAL HISTORY:  Ulcer.   PAST SURGICAL HISTORY:  None.   MEDICATIONS:  1. Tri-Cyclen p.o. daily.  2. Iron OTC p.o. daily.   ALLERGIES:  1. PENICILLIN.  2. PERCOCET.  3. KEFLEX.  4.  SEAFOOD.  5. SHELLFISH.   The patient has no allergies to latex.   SOCIAL HISTORY:  The patient is currently unemployed.  She smokes 1/2 a  pack a day for 6 years.  She does not drink alcohol.  Does not use any  illicit drugs and denies abuse.   FAMILY HISTORY:  Mother had lump in her breast and uterus ? cancer.  Also has a significant  family history of diabetes and heart disease.   SYSTEMIC REVIEW:  The patient reports swelling in legs, muscle aches,  fatigue, dizzy spells, problems with breathing, nausea, vomiting, and  vaginal bleeding.   PHYSICAL EXAMINATION:  VITAL SIGNS:  Temperature 98.9, pulse 86, blood  pressure 117/76, weight 331 pounds, height 5 feet 7-1/2 inches.  GENERAL:  No apparent distress.  ABDOMEN:  Soft, nontender, nondistended.  PELVIC:  Normal external  female genitalia.  On speculum examination,  pink, well-rugated vagina, small amount of bright red blood involved.  No active bleeding noted.  Cervix normal.  No lesions.  Pap smear,  sample obtained.   ENDOMETRIAL BIOPSY:  The patient was counseled regarding the need for  endometrial biopsy given her menometrorrhagia and obesity.  All  questions were answered.  Informed consent was obtained.  Urine  pregnancy test was done and was negative.  Cervix was swabbed with  Betadine and an attempt was made to pass the [place] and __________  speculum was in place in anterior cervix and a sound was used but was  only able to advance to 4 cm.  At this point, the patient states that  she was in excruciating pain and demanding that procedure be stopped.  The procedure was abandoned at this point.  No tissue was obtained.  The  __________ removed from the patient's vagina, small amount of bleeding  was controlled.  Pressure and speculum was removed.   ASSESSMENT/PLAN:  The patient is a 35 year old G0 with menometrorrhagia,  now status post failed endometrial biopsy.  The patient was counseled  regarding the importance  of getting the tissue biopsy.  She is amenable  to have a D&C and hysteroscopy in the OR and this will be booked.  The  patient was also told that the Ortho Tri-Cyclen Lo might not be enough.  Hormones to stabilize her bleeders.  She was switched over to Provera 10  mg p.o. b.i.d. for 15 days to stabilize her bleeding and the  prescription was given also for ibuprofen 800 mg p.o. t.i.d. p.r.n.  pain.  The patient was told to follow up as needed and to expect a call  from the surgical scheduler for her D&C hysteroscopy  She will follow up  in 3 weeks and at this point we will see how she is doing.  May also  discuss possible IUD insertion at the time of surgery.           ______________________________  Johnella Moloney, MD     UD/MEDQ  D:  09/13/2007  T:  09/14/2007  Job:  [id number]

## 2011-04-02 ENCOUNTER — Other Ambulatory Visit: Payer: Self-pay | Admitting: Internal Medicine

## 2011-04-02 ENCOUNTER — Ambulatory Visit: Payer: 59 | Admitting: Pulmonary Disease

## 2011-04-05 ENCOUNTER — Ambulatory Visit (INDEPENDENT_AMBULATORY_CARE_PROVIDER_SITE_OTHER): Payer: 59 | Admitting: Internal Medicine

## 2011-04-05 ENCOUNTER — Encounter: Payer: Self-pay | Admitting: Internal Medicine

## 2011-04-05 DIAGNOSIS — R609 Edema, unspecified: Secondary | ICD-10-CM

## 2011-04-05 DIAGNOSIS — F329 Major depressive disorder, single episode, unspecified: Secondary | ICD-10-CM

## 2011-04-05 DIAGNOSIS — G43909 Migraine, unspecified, not intractable, without status migrainosus: Secondary | ICD-10-CM

## 2011-04-05 MED ORDER — PROMETHAZINE HCL 25 MG/ML IJ SOLN
25.0000 mg | Freq: Once | INTRAMUSCULAR | Status: AC
  Administered 2011-04-05: 25 mg via INTRAMUSCULAR

## 2011-04-05 MED ORDER — SUMATRIPTAN SUCCINATE 100 MG PO TABS: 100.0000 mg | ORAL_TABLET | ORAL | Status: DC | PRN

## 2011-04-05 MED ORDER — MEPERIDINE HCL 50 MG/ML IJ SOLN
50.0000 mg | Freq: Once | INTRAMUSCULAR | Status: AC
  Administered 2011-04-05: 50 mg via INTRAMUSCULAR

## 2011-04-05 MED ORDER — FLUOXETINE HCL 20 MG PO CAPS: ORAL_CAPSULE | ORAL | Status: DC

## 2011-04-05 NOTE — Patient Instructions (Addendum)
You are given the demerol/phenergan shot today for the migraine You are given the work note Take all new medications as prescribed - the imitrex (generic) for future migraine (you can still take the excedrin migraine for milder headache) Increase the prozac to 60 mg total per day You are given the Chantix coupon today Continue all other medications as before Please return in 3 mo with Lab testing done 3-5 days before

## 2011-04-05 NOTE — Assessment & Plan Note (Signed)
stable overall by hx and exam, most recent data reviewed with pt, and pt to continue medical treatment as before  Lab Results  Component Value Date   WBC 7.9 03/08/2011   HGB 10.5* 03/08/2011   HCT 32.0* 03/08/2011   PLT 364.0 03/08/2011   CHOL 170 01/06/2011   TRIG 117.0 01/06/2011   HDL 35.60* 01/06/2011   ALT 19 03/08/2011   AST 18 03/08/2011   NA 137 01/06/2011   K 3.8 01/06/2011   CL 105 01/06/2011   CREATININE 0.8 01/06/2011   BUN 11 01/06/2011   CO2 26 01/06/2011   TSH 2.23 01/06/2011   INR 1.34 05/13/2010   HGBA1C 5.6 01/06/2011

## 2011-04-05 NOTE — Assessment & Plan Note (Signed)
Persists despite OTC med tx, for demerol/phenergan IM today, imitrex prn, declines HA clinic referral

## 2011-04-05 NOTE — Assessment & Plan Note (Signed)
Mild worse per pt; to incr the prozac to 60 mg qd,declines counseling or psychiatric referral, verified nonsuicidal,   to f/u any worsening symptoms or concerns

## 2011-04-05 NOTE — Progress Notes (Signed)
Subjective:    Patient ID: Nicole Melendez, female    DOB: [birth year], 35 y.o.   MRN: [id number]  HPI Here to f/u, with c/o persistent severe HA onset last night, bifrontal, throbbing, assoc with photophobia and nausea, no vomiting; not better with excedrin migraine;  Has worsening depressive symptoms, but no suicidal ideation, or panic, and has ongoing anxiety, not increased recently.   Unable to go to work today.  Pt denies chest pain, increased sob or doe, wheezing, orthopnea, PND, increased LE swelling, palpitations, dizziness or syncope.  Pt denies new neurological symptoms such,as  facial or extremity weakness or numbness.   Pt denies polydipsia, polyuria.   Pt denies fever, wt loss, night sweats, loss of appetite, or other constitutional symptoms  Overall good compliance with treatment, and good medicine tolerability.   Concerned edema may be getting worse , no wt change, Past Medical History  Diagnosis Date  . Morbid obesity   . Peptic ulcer disease   . GERD (gastroesophageal reflux disease)   . Migraine   . Anxiety   . Anemia   . Hyperlipidemia   . Menorrhagia   . PNA (pneumonia) july 2011  . Diabetes mellitus type II     steroid related  . Depression   . Sarcoid     including hand per rheumatology-Dr. Dierdre Forth  . Allergic rhinitis, cause unspecified 02/17/2011  . Varicose veins with pain 02/17/2011   Past Surgical History  Procedure Date  . Uterine ablation 03/2010    reports that she quit smoking about a year ago. Her smoking use included Cigarettes. She has a 20 pack-year smoking history. She does not have any smokeless tobacco history on file. She reports that she drinks alcohol. She reports that she does not use illicit drugs. family history includes Allergies in her mother; Bone cancer in her maternal aunt; Breast cancer in her maternal aunt; Diabetes in an unspecified family member; Heart attack in her mother; Heart disease in her father; Hypertension in an unspecified family  member; Lung cancer in her maternal aunt; Ovarian cancer in her maternal aunt; and Rheum arthritis in her father. Allergies  Allergen Reactions  . Azithromycin     (z pak) hives  . Oxycodone-Acetaminophen     REACTION: difficulty breathing  . Penicillins     REACTION: swelling and difficulty breathing   Current Outpatient Prescriptions on File Prior to Visit  Medication Sig Dispense Refill  . acetaminophen (TYLENOL) 650 MG CR tablet 1-2 tablets by mouth 3 times a day as needed       . albuterol (PROAIR HFA) 108 (90 BASE) MCG/ACT inhaler 2 puffs up to 4 times a day as needed       . ALPRAZolam (XANAX) 0.5 MG tablet 1 by mouth 2 times a day as needed      . B-D UF III MINI PEN NEEDLES 31G X 5 MM MISC       . budesonide-formoterol (SYMBICORT) 160-4.5 MCG/ACT inhaler Inhale 2 puffs into the lungs 2 (two) times daily.        . CELEBREX 200 MG capsule Once a day as needed      . cetirizine (ZYRTEC) 10 MG tablet Take 10 mg by mouth daily.        . fluticasone (FLONASE) 50 MCG/ACT nasal spray 2 sprays by Nasal route daily.  16 g  2  . furosemide (LASIX) 20 MG tablet daily as needed.       . hydrOXYzine (ATARAX) 25 MG tablet  1-2 by mouth every 6 hrs as needed for itching       . JANUMET 50-500 MG per tablet 1 twice a day      . methotrexate (RHEUMATREX) 2.5 MG tablet Take 4 tabs by mouth once weekly.  16 tablet  0  . NOVOLOG FLEXPEN 100 UNIT/ML injection       . omeprazole (PRILOSEC) 20 MG capsule 2 once a day       . ONE TOUCH ULTRA TEST test strip       . ONETOUCH DELICA LANCETS MISC       . predniSONE (DELTASONE) 10 MG tablet Take 10 mg by mouth daily.        Marland Kitchen zolpidem (AMBIEN CR) 12.5 MG CR tablet TAKE ONE TABLET BY MOUTH AT BEDTIME AS NEEDED  30 tablet  5  . DISCONTD: FLUoxetine (PROZAC) 40 MG capsule Once a day      . CHANTIX STARTING MONTH PAK 0.5 MG X 11 & 1 MG X 42 tablet USE DAILY AS DIRECTED  53 tablet  0   No current facility-administered medications on file prior to visit.    Review of Systems All otherwise neg per pt     Objective:   Physical Exam BP 118/78  Pulse 74  Temp(Src) 98.4 F (36.9 C) (Oral)  Ht 5\' 7"  (1.702 m)  Wt 294 lb 4 oz (133.471 kg)  BMI 46.09 kg/m2  SpO2 97%  LMP 03/14/2011 Physical Exam  VS noted Constitutional: Pt appears well-developed and well-nourished.  HENT: Head: Normocephalic.  Right Ear: External ear normal.  Left Ear: External ear normal.  Eyes: Conjunctivae and EOM are normal. Pupils are equal, round, and reactive to light.  Neck: Normal range of motion. Neck supple.  Cardiovascular: Normal rate and regular rhythm.   Pulmonary/Chest: Effort normal and breath sounds normal.  Abd:  Soft, NT, non-distended, + BS Neurological: Pt is alert. No cranial nerve deficit. motor/sens/dtr's intact, gait normal Skin: Skin is warm. No erythema. No edema Psychiatric: Pt behavior is normal. Thought content normal. Has depressed affect, 1+ nervous        Assessment & Plan:

## 2011-04-07 ENCOUNTER — Encounter: Payer: 59 | Admitting: Vascular Surgery

## 2011-04-08 ENCOUNTER — Other Ambulatory Visit: Payer: Self-pay | Admitting: Pulmonary Disease

## 2011-04-09 NOTE — Telephone Encounter (Signed)
Pt checking on status of refill for methotrexate.Nicole Melendez

## 2011-04-12 ENCOUNTER — Ambulatory Visit: Payer: 59 | Admitting: Pulmonary Disease

## 2011-04-19 ENCOUNTER — Encounter: Payer: Self-pay | Admitting: Pulmonary Disease

## 2011-04-20 ENCOUNTER — Ambulatory Visit: Payer: 59 | Admitting: Pulmonary Disease

## 2011-04-29 ENCOUNTER — Other Ambulatory Visit: Payer: Self-pay | Admitting: Internal Medicine

## 2011-05-07 ENCOUNTER — Ambulatory Visit (INDEPENDENT_AMBULATORY_CARE_PROVIDER_SITE_OTHER)
Admission: RE | Admit: 2011-05-07 | Discharge: 2011-05-07 | Disposition: A | Discharge status: Home / Self Care | Payer: 59 | Source: Ambulatory Visit | Attending: Pulmonary Disease | Admitting: Pulmonary Disease

## 2011-05-07 ENCOUNTER — Ambulatory Visit (INDEPENDENT_AMBULATORY_CARE_PROVIDER_SITE_OTHER): Payer: 59 | Admitting: Pulmonary Disease

## 2011-05-07 VITALS — BP 130/80 | HR 94 | Temp 98.1°F | Ht 67.0 in | Wt 305.4 lb

## 2011-05-07 DIAGNOSIS — D869 Sarcoidosis, unspecified: Secondary | ICD-10-CM

## 2011-05-07 MED ORDER — METHOTREXATE 2.5 MG PO TABS: ORAL_TABLET | ORAL | Status: DC

## 2011-05-07 NOTE — Patient Instructions (Signed)
Stay on methotrexate 10mg  weekly along with prednisone 10mg  a day Will check bloodwork today, and let you know the results. followup with me in 6 wks. Stop smoking.

## 2011-05-07 NOTE — Progress Notes (Signed)
  Subjective:    Patient ID: Nicole Melendez, female    DOB: [birth year], 35 y.o.   MRN: [id number]  HPI The pt comes in today for f/u of her known sarcoidosis.  She is being treated with mtx and low dose prednisone, and feels she is doing much better.  She has had improvement of her arthritis, cough, and sob.  She is tolerating her meds well, but needs to be more vigilant with lab checks.  She has some purulent mucus that she feels is coming from her sinuses, but not consistent, and not overly symptomatic.   Review of Systems  Constitutional: Negative for fever and unexpected weight change.  HENT: Positive for congestion, rhinorrhea, sneezing, postnasal drip and sinus pressure. Negative for ear pain, nosebleeds, sore throat, trouble swallowing and dental problem.   Eyes: Negative for redness and itching.  Respiratory: Positive for cough. Negative for chest tightness, shortness of breath and wheezing.   Cardiovascular: Negative for palpitations and leg swelling.  Gastrointestinal: Positive for nausea and vomiting.  Genitourinary: Negative for dysuria.  Musculoskeletal: Negative for joint swelling.  Skin: Negative for rash.  Neurological: Positive for headaches.  Hematological: Does not bruise/bleed easily.  Psychiatric/Behavioral: Negative for dysphoric mood. The patient is not nervous/anxious.        Objective:   Physical Exam Obese female in nad Nares without purulence or discharge Chest with decreased bs, but no crackles, pops, or wheezes Cor with rrr LE with minimal edema, no cyanosis Alert, oriented,moves all 4        Assessment & Plan:

## 2011-05-10 ENCOUNTER — Other Ambulatory Visit (INDEPENDENT_AMBULATORY_CARE_PROVIDER_SITE_OTHER): Payer: 59

## 2011-05-10 DIAGNOSIS — D869 Sarcoidosis, unspecified: Secondary | ICD-10-CM

## 2011-05-10 LAB — HEPATIC FUNCTION PANEL
ALT: 22 U/L (ref 0–35)
Alkaline Phosphatase: 113 U/L (ref 39–117)
Bilirubin, Direct: 0.1 mg/dL (ref 0.0–0.3)
Total Protein: 7.4 g/dL (ref 6.0–8.3)

## 2011-05-10 LAB — CBC WITH DIFFERENTIAL/PLATELET
Basophils Relative: 0.2 % (ref 0.0–3.0)
Eosinophils Absolute: 0.1 10*3/uL (ref 0.0–0.7)
Eosinophils Relative: 0.9 % (ref 0.0–5.0)
Hemoglobin: 9.6 g/dL — ABNORMAL LOW (ref 12.0–15.0)
Lymphocytes Relative: 4 % — ABNORMAL LOW (ref 12.0–46.0)
MCHC: 32 g/dL (ref 30.0–36.0)
MCV: 68.7 fl — ABNORMAL LOW (ref 78.0–100.0)
Neutro Abs: 9.4 10*3/uL — ABNORMAL HIGH (ref 1.4–7.7)
Neutrophils Relative %: 90.3 % — ABNORMAL HIGH (ref 43.0–77.0)
RBC: 4.39 Mil/uL (ref 3.87–5.11)
WBC: 10.4 10*3/uL (ref 4.5–10.5)

## 2011-05-11 ENCOUNTER — Encounter: Payer: Self-pay | Admitting: Pulmonary Disease

## 2011-05-11 NOTE — Assessment & Plan Note (Signed)
The pt has really done well on her current meds, and I have asked her to continue.  Her cxr today is really unchanged, but she feels her breathing, cough, and arthritis symptoms are much improved.  She still has doe due to her morbid obesity and deconditioning, and she needs to work on this.  We also need to keep tabs on her labs while taking mtx.  She is having some scant discolored mucus, but is not consistent, and she thinks may be coming from her sinuses.

## 2011-05-13 IMAGING — CR DG CHEST 2V
2 series · 2 of 2 positions shown · non-contrast
Comparison: Chest x-ray of 05/19/2010

CLINICAL DATA: Shortness of breath, cough, chest tightness, former
smoker

CHEST - 2 VIEW

[view not recorded (1 of 2)]
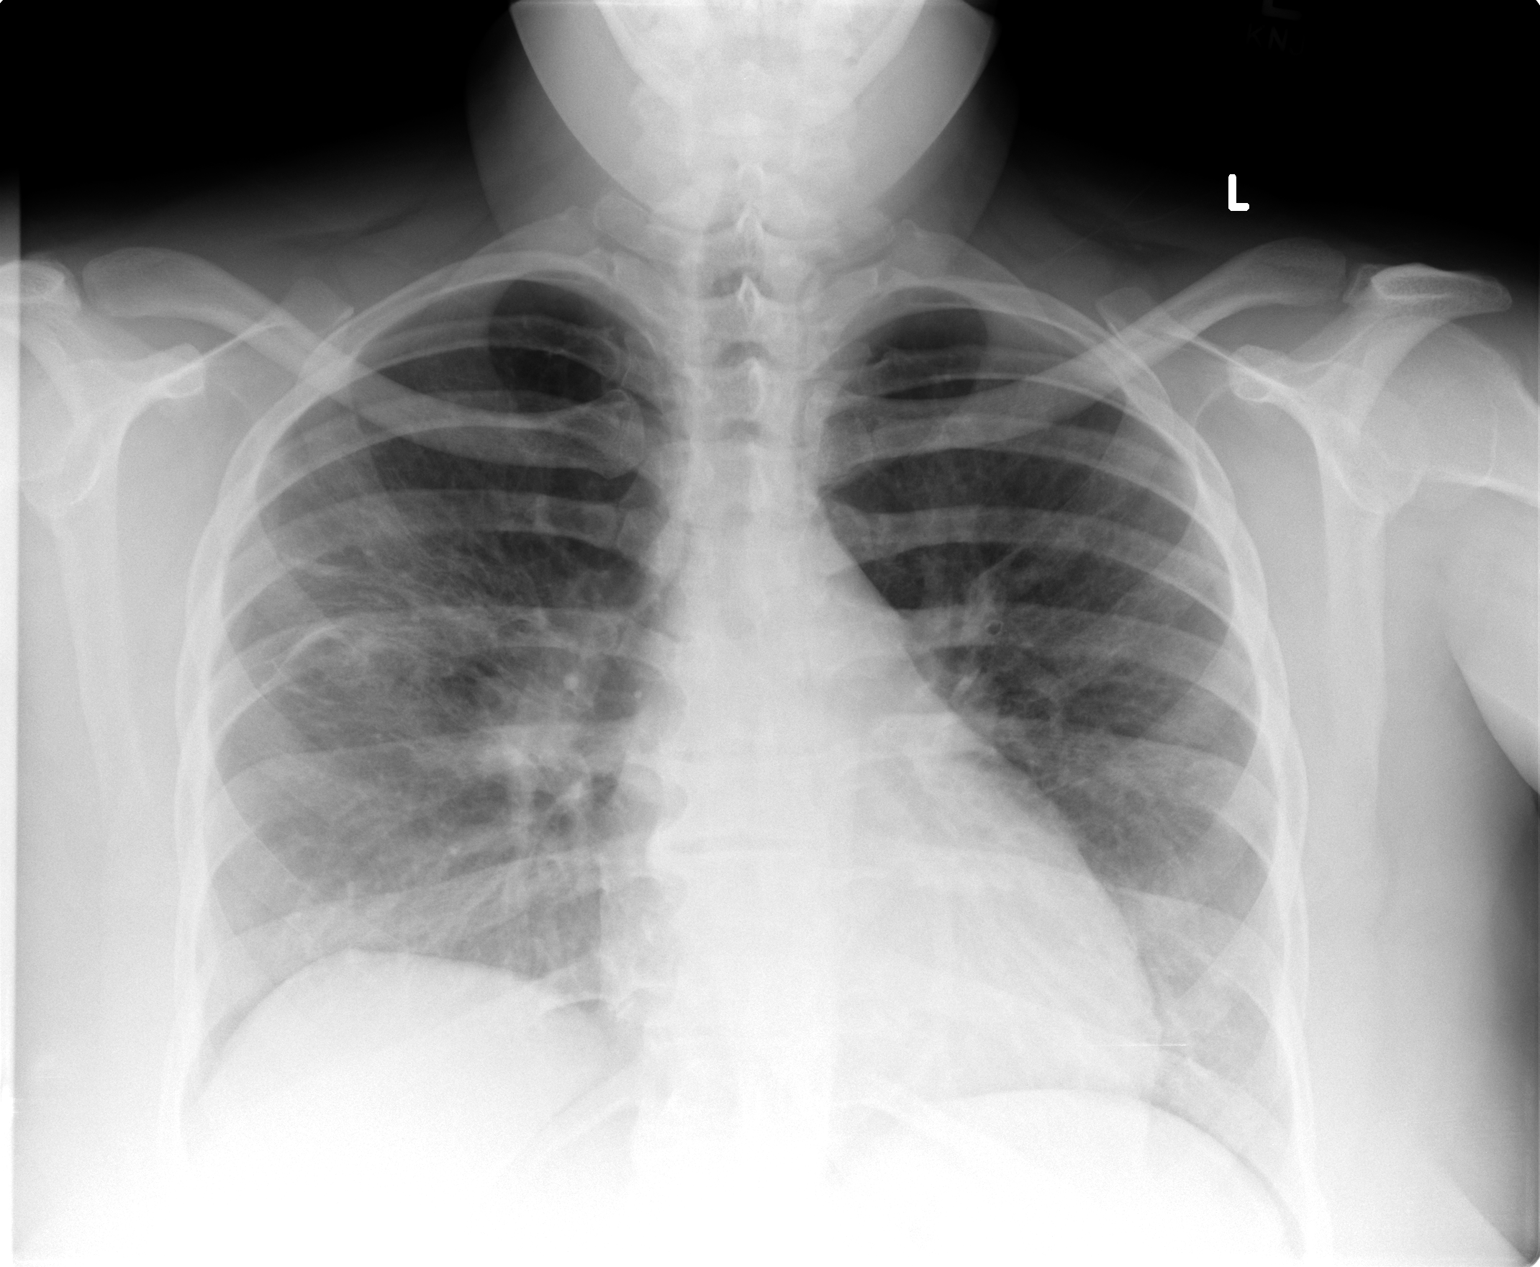

[view not recorded (2 of 2)]
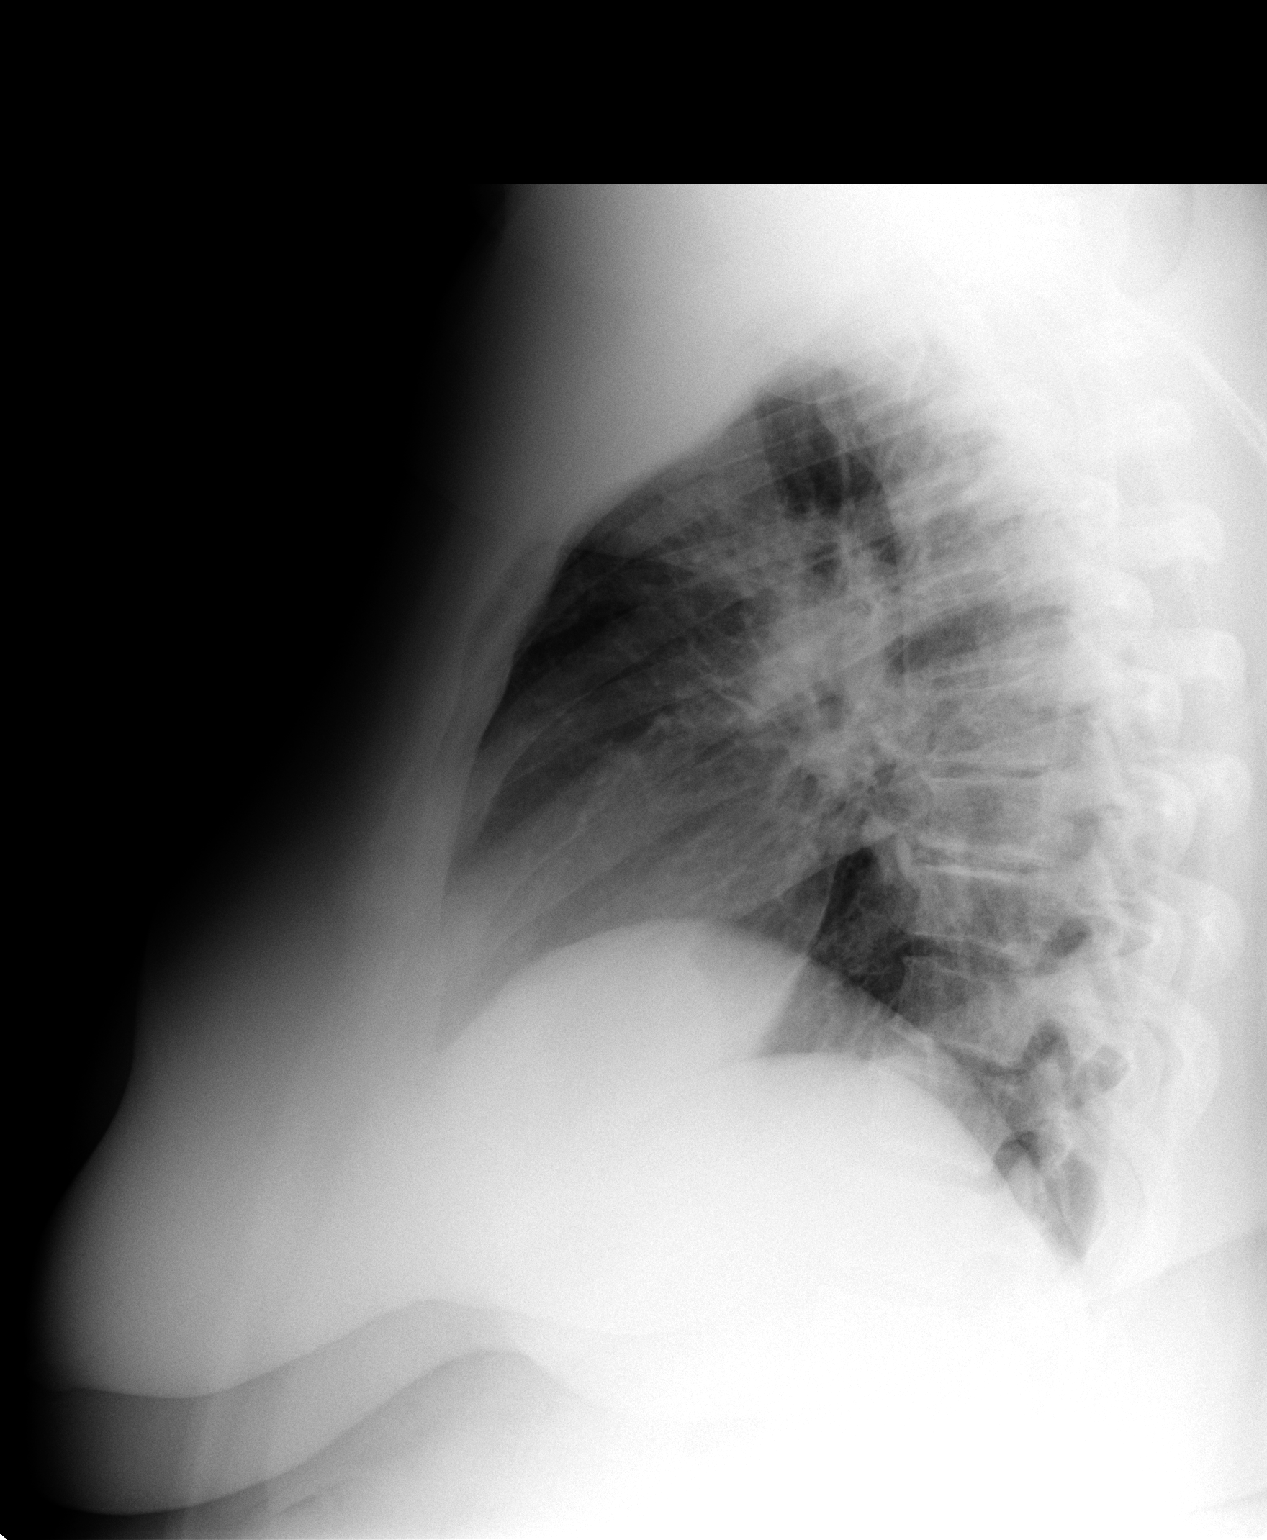

[2 of 2 positions shown; findings below may reference images not displayed]

FINDINGS: There is incomplete clearing of the opacity overlying the
right hilum and right mid lung field when compared to the most
recent chest x-ray.  Otherwise the lungs are clear.  The heart is
within upper limits of normal.  No bony abnormality is seen.
IMPRESSION: Incomplete clearing of right perihilar and right mid lung
infiltrate.  Recommend continued follow-up.

## 2011-05-14 ENCOUNTER — Other Ambulatory Visit: Payer: Self-pay

## 2011-05-14 ENCOUNTER — Telehealth: Payer: Self-pay | Admitting: Internal Medicine

## 2011-05-14 MED ORDER — FUROSEMIDE 20 MG PO TABS: 20.0000 mg | ORAL_TABLET | Freq: Every day | ORAL | Status: DC

## 2011-05-14 NOTE — Telephone Encounter (Signed)
Dr Jonny Ruiz patient, will forward.

## 2011-05-14 NOTE — Telephone Encounter (Signed)
Routines to robin 

## 2011-06-15 ENCOUNTER — Other Ambulatory Visit: Payer: Self-pay | Admitting: Internal Medicine

## 2011-06-18 ENCOUNTER — Encounter: Payer: Self-pay | Admitting: Pulmonary Disease

## 2011-06-18 ENCOUNTER — Ambulatory Visit (INDEPENDENT_AMBULATORY_CARE_PROVIDER_SITE_OTHER): Payer: 59 | Admitting: Pulmonary Disease

## 2011-06-18 VITALS — BP 122/74 | HR 108 | Temp 98.0°F | Ht 67.0 in | Wt 308.6 lb

## 2011-06-18 DIAGNOSIS — D869 Sarcoidosis, unspecified: Secondary | ICD-10-CM

## 2011-06-18 MED ORDER — VARENICLINE TARTRATE 1 MG PO TABS: 1.0000 mg | ORAL_TABLET | Freq: Two times a day (BID) | ORAL | Status: AC

## 2011-06-18 MED ORDER — VARENICLINE TARTRATE 0.5 MG X 11 & 1 MG X 42 PO MISC: ORAL | Status: AC

## 2011-06-18 MED ORDER — PREDNISONE 5 MG PO TABS: ORAL_TABLET | ORAL | Status: DC

## 2011-06-18 NOTE — Patient Instructions (Signed)
Decrease prednisone to 7.5mg  each day for 2 weeks, then go to 5mg  each day after that and stay there.  Go back to 10mg  a day if you have increased breathing or joint symptoms.  Will check bloodwork today, and monthly! Will give you prescription for chantix to quit smoking. Work on [employer] loss and exercise program. followup with me in 3mos.

## 2011-06-18 NOTE — Assessment & Plan Note (Signed)
The patient is much improved on methotrexate with low-dose prednisone, and I would like to try and taper her prednisone to lower doses given her underlying diabetes.  I have also stressed the importance of total smoking cessation, and we'll give the patient a prescription to try Chantix.  I have also encouraged her to work aggressively on weight loss and some type of exercise program now that she is feeling better.  We will check a chest x-ray next visit.

## 2011-06-18 NOTE — Progress Notes (Signed)
  Subjective:    Patient ID: Nicole Melendez, female    DOB: [birth year], 35 y.o.   MRN: [id number]  HPI The patient comes in today for followup of her known sarcoidosis, being treated with methotrexate and low-dose prednisone.  She has done extremely well from a pulmonary standpoint on this regimen, and has improved dyspnea on exertion and minimal cough at this time.  She has had periodic blood work with no evidence for myelosuppression or liver dysfunction.  Unfortunately, she is continuing to smoke, and is interested in trying Chantix.   Review of Systems  Constitutional: Negative for fever and unexpected weight change.  HENT: Positive for congestion, rhinorrhea, sneezing and postnasal drip. Negative for ear pain, nosebleeds, sore throat, trouble swallowing, dental problem and sinus pressure.   Eyes: Negative for redness and itching.  Respiratory: Negative for cough, chest tightness, shortness of breath and wheezing.   Cardiovascular: Positive for leg swelling. Negative for palpitations.  Gastrointestinal: Negative for nausea and vomiting.  Genitourinary: Negative for dysuria.  Musculoskeletal: Positive for joint swelling.  Skin: Negative for rash.  Neurological: Positive for headaches.  Hematological: Bruises/bleeds easily.  Psychiatric/Behavioral: Positive for dysphoric mood. The patient is nervous/anxious.        Objective:   Physical Exam Obese female in no acute distress Nose without purulent discharge noted Chest clear to auscultation, no wheezes or rhonchi Cardiac exam with regular rate and rhythm, no MRG Lower extremities with no significant edema, pulses intact distally Alert and oriented, moves all 4 extremities.       Assessment & Plan:

## 2011-07-05 ENCOUNTER — Other Ambulatory Visit: Payer: Self-pay | Admitting: Internal Medicine

## 2011-07-05 ENCOUNTER — Other Ambulatory Visit (INDEPENDENT_AMBULATORY_CARE_PROVIDER_SITE_OTHER): Payer: 59

## 2011-07-05 DIAGNOSIS — E119 Type 2 diabetes mellitus without complications: Secondary | ICD-10-CM

## 2011-07-05 DIAGNOSIS — D869 Sarcoidosis, unspecified: Secondary | ICD-10-CM

## 2011-07-05 DIAGNOSIS — D509 Iron deficiency anemia, unspecified: Secondary | ICD-10-CM

## 2011-07-05 LAB — CBC WITH DIFFERENTIAL/PLATELET
Basophils Absolute: 0.1 10*3/uL (ref 0.0–0.1)
Basophils Relative: 0.7 % (ref 0.0–3.0)
Eosinophils Absolute: 0.2 10*3/uL (ref 0.0–0.7)
Eosinophils Relative: 2.3 % (ref 0.0–5.0)
HCT: 35.7 % — ABNORMAL LOW (ref 36.0–46.0)
HCT: 36.9 % (ref 36.0–46.0)
Hemoglobin: 11 g/dL — ABNORMAL LOW (ref 12.0–15.0)
Lymphocytes Relative: 11.9 % — ABNORMAL LOW (ref 12.0–46.0)
Lymphs Abs: 1 10*3/uL (ref 0.7–4.0)
MCV: 68.9 fl — ABNORMAL LOW (ref 78.0–100.0)
Monocytes Relative: 6.8 % (ref 3.0–12.0)
Neutrophils Relative %: 78.1 % — ABNORMAL HIGH (ref 43.0–77.0)
Platelets: 441 10*3/uL — ABNORMAL HIGH (ref 150.0–400.0)
Platelets: 441 10*3/uL — ABNORMAL HIGH (ref 150.0–400.0)
RDW: 20.3 % — ABNORMAL HIGH (ref 11.5–14.6)
RDW: 20.8 % — ABNORMAL HIGH (ref 11.5–14.6)

## 2011-07-05 LAB — BASIC METABOLIC PANEL
BUN: 15 mg/dL (ref 6–23)
Calcium: 9.1 mg/dL (ref 8.4–10.5)
Creatinine, Ser: 1 mg/dL (ref 0.4–1.2)
GFR: 84.9 mL/min (ref >60.00)
Potassium: 3.9 mEq/L (ref 3.5–5.1)

## 2011-07-05 LAB — IBC PANEL
Iron: 29 ug/dL — ABNORMAL LOW (ref 42–145)
Saturation Ratios: 7.1 % — ABNORMAL LOW (ref 20.0–50.0)
Transferrin: 293.7 mg/dL (ref 212.0–360.0)

## 2011-07-05 LAB — LIPID PANEL
Cholesterol: 205 mg/dL — ABNORMAL HIGH (ref 0–200)
Total CHOL/HDL Ratio: 4
Triglycerides: 164 mg/dL — ABNORMAL HIGH (ref 0.0–149.0)
VLDL: 32.8 mg/dL (ref 0.0–40.0)

## 2011-07-05 LAB — HEPATIC FUNCTION PANEL
AST: 20 U/L (ref 0–37)
Total Bilirubin: 0.3 mg/dL (ref 0.3–1.2)

## 2011-07-06 ENCOUNTER — Other Ambulatory Visit: Payer: Self-pay

## 2011-07-06 MED ORDER — ALPRAZOLAM 0.5 MG PO TABS: ORAL_TABLET | ORAL | Status: DC

## 2011-07-06 NOTE — Telephone Encounter (Signed)
Faxed hardcopy to [facility]. 

## 2011-07-07 ENCOUNTER — Encounter: Payer: Self-pay | Admitting: Internal Medicine

## 2011-07-07 ENCOUNTER — Ambulatory Visit (INDEPENDENT_AMBULATORY_CARE_PROVIDER_SITE_OTHER): Payer: 59 | Admitting: Internal Medicine

## 2011-07-07 VITALS — BP 112/72 | HR 84 | Temp 98.3°F | Ht 67.0 in | Wt 309.8 lb

## 2011-07-07 DIAGNOSIS — Z0000 Encounter for general adult medical examination without abnormal findings: Secondary | ICD-10-CM

## 2011-07-07 DIAGNOSIS — M7712 Lateral epicondylitis, left elbow: Secondary | ICD-10-CM

## 2011-07-07 DIAGNOSIS — M7710 Lateral epicondylitis, unspecified elbow: Secondary | ICD-10-CM

## 2011-07-07 DIAGNOSIS — E785 Hyperlipidemia, unspecified: Secondary | ICD-10-CM

## 2011-07-07 DIAGNOSIS — E119 Type 2 diabetes mellitus without complications: Secondary | ICD-10-CM

## 2011-07-07 DIAGNOSIS — J019 Acute sinusitis, unspecified: Secondary | ICD-10-CM

## 2011-07-07 DIAGNOSIS — Z23 Encounter for immunization: Secondary | ICD-10-CM

## 2011-07-07 MED ORDER — ATORVASTATIN CALCIUM 10 MG PO TABS: 10.0000 mg | ORAL_TABLET | Freq: Every day | ORAL | Status: DC

## 2011-07-07 MED ORDER — FLUCONAZOLE 150 MG PO TABS: ORAL_TABLET | ORAL | Status: AC

## 2011-07-07 MED ORDER — SITAGLIPTIN PHOS-METFORMIN HCL 50-500 MG PO TABS: 1.0000 | ORAL_TABLET | Freq: Every day | ORAL | Status: DC

## 2011-07-07 MED ORDER — TRAMADOL HCL 50 MG PO TABS: 50.0000 mg | ORAL_TABLET | Freq: Four times a day (QID) | ORAL | Status: AC | PRN

## 2011-07-07 MED ORDER — DOXYCYCLINE HYCLATE 100 MG PO TABS: 100.0000 mg | ORAL_TABLET | Freq: Two times a day (BID) | ORAL | Status: AC

## 2011-07-07 NOTE — Patient Instructions (Signed)
You had the flu shot today Take all new medications as prescribed - the antibiotic, and the low dose lipitor, and the pain medication Please decrease the janumet to one pill in the AM only Please also use a Forearm band during the day, to help take the pressure off the left elbow tendonitis (ok to take off at night) until the pain is improved Please keep your appointments with your specialists as you have planned - Dr Dierdre Forth for next Monday Please return in 6 mo with Lab testing done 3-5 days before

## 2011-07-07 NOTE — Assessment & Plan Note (Signed)
Mild overcontrolled now, with several low AM sugars, though on prednisone; ok to decrease the janumet to aqm only  Lab Results  Component Value Date   HGBA1C 5.6 07/05/2011

## 2011-07-07 NOTE — Assessment & Plan Note (Signed)
Uncontrolled, goal ldl < 70 - to add lipitor 10 qd, f/u labs next visit

## 2011-07-08 ENCOUNTER — Encounter: Payer: Self-pay | Admitting: Internal Medicine

## 2011-07-08 NOTE — Assessment & Plan Note (Signed)
Mild to mod, for tramadol prn, forearm band during day only, consider 2-3 sick day off work (pt declines), f/u rheum Mon as she already has planned - ? For cortisone shot

## 2011-07-08 NOTE — Progress Notes (Signed)
Subjective:    Patient ID: Nicole Melendez, female    DOB: Dec 24, 1975, 35 y.o.   MRN: [id number]  HPI   Here with 3 days acute onset fever, facial pain, pressure, general weakness and malaise, and greenish d/c, with slight ST, but little to no cough and Pt denies chest pain, increased sob or doe, wheezing, orthopnea, PND, increased LE swelling, palpitations, dizziness or syncope.  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  Pt denies new neurological symptoms such as new headache, or facial or extremity weakness or numbness   Pt denies polydipsia, polyuria, or low sugar symptoms such as weakness or confusion improved with po intake.  Pt states overall good compliance with meds, trying to follow lower cholesterol, diabetic diet, wt overall stable but little exercise however.   Past Medical History  Diagnosis Date  . Morbid obesity   . Peptic ulcer disease   . GERD (gastroesophageal reflux disease)   . Migraine   . Anxiety   . Anemia   . Hyperlipidemia   . Menorrhagia   . PNA (pneumonia) july 2011  . Diabetes mellitus type II     steroid related  . Depression   . Sarcoid     including hand per rheumatology-Dr. Dierdre Forth  . Allergic rhinitis, cause unspecified 02/17/2011  . Varicose veins with pain 02/17/2011   Past Surgical History  Procedure Date  . Uterine ablation 03/2010    reports that she quit smoking about 14 months ago. Her smoking use included Cigarettes. She has a 20 pack-year smoking history. She does not have any smokeless tobacco history on file. She reports that she drinks alcohol. She reports that she does not use illicit drugs. family history includes Allergies in her mother; Bone cancer in her maternal aunt; Breast cancer in her maternal aunt; Diabetes in an unspecified family member; Heart attack in her mother; Heart disease in her father; Hypertension in an unspecified family member; Lung cancer in her maternal  aunt; Ovarian cancer in her maternal aunt; and Rheum arthritis in her father. Allergies  Allergen Reactions  . Azithromycin     (z pak) hives  . Oxycodone-Acetaminophen     REACTION: difficulty breathing  . Penicillins     REACTION: swelling and difficulty breathing   Current Outpatient Prescriptions on File Prior to Visit  Medication Sig Dispense Refill  . acetaminophen (TYLENOL) 650 MG CR tablet 1-2 tablets by mouth 3 times a day as needed       . albuterol (PROAIR HFA) 108 (90 BASE) MCG/ACT inhaler 2 puffs up to 4 times a day as needed       . ALPRAZolam (XANAX) 0.5 MG tablet 1 by mouth 2 times a day as needed  60 tablet  2  . B-D UF III MINI PEN NEEDLES 31G X 5 MM MISC       . CELEBREX 200 MG capsule Once a day as needed      . cetirizine (ZYRTEC) 10 MG tablet Take 10 mg by mouth daily.        Marland Kitchen FLUoxetine (PROZAC) 20 MG capsule Take 3 tabs by mouth per day  90 capsule  11  . fluticasone (FLONASE) 50 MCG/ACT nasal spray 2 sprays by Nasal route daily.  16 g  2  . furosemide (LASIX) 20 MG tablet Take 20 mg by mouth daily as needed.        . methotrexate (RHEUMATREX) 2.5 MG  tablet Take 4 tabs by mouth once weekly.  18 tablet  3  . NOVOLOG FLEXPEN 100 UNIT/ML injection as needed.       Marland Kitchen omeprazole (PRILOSEC) 20 MG capsule TAKE 2 CAPSULES BY MOUTH ONCE DAILY  60 capsule  11  . ONE TOUCH ULTRA TEST test strip       . ONETOUCH DELICA LANCETS MISC       . predniSONE (DELTASONE) 5 MG tablet Take as directed  100 tablet  1  . SUMAtriptan (IMITREX) 100 MG tablet Take 1 tablet (100 mg total) by mouth every 2 (two) hours as needed for migraine.  10 tablet  5  . SYMBICORT 160-4.5 MCG/ACT inhaler INHALE 2 PUFFS BY MOUTH TWICE DAILY  10.2 g  6  . varenicline (CHANTIX CONTINUING MONTH PAK) 1 MG tablet Take 1 tablet (1 mg total) by mouth 2 (two) times daily.  60 tablet  0  . varenicline (CHANTIX STARTING MONTH PAK) 0.5 MG X 11 & 1 MG X 42 tablet Take one 0.5mg  tablet by mouth once daily for 3 days,  then increase to one 0.5mg  tablet twice daily for 3 days, then increase to one 1mg  tablet twice daily.  53 tablet  0  . zolpidem (AMBIEN CR) 12.5 MG CR tablet TAKE ONE TABLET BY MOUTH AT BEDTIME AS NEEDED  30 tablet  5  . predniSONE (DELTASONE) 10 MG tablet Take 10 mg by mouth daily.         Review of Systems Review of Systems  Constitutional: Negative for diaphoresis and unexpected weight change.  HENT: Negative for drooling and tinnitus.   Eyes: Negative for photophobia and visual disturbance.  Respiratory: Negative for choking and stridor.   Gastrointestinal: Negative for vomiting and blood in stool.  Genitourinary: Negative for hematuria and decreased urine volume.  Musculoskeletal: Negative for gait problem.  Skin: Negative for color change and wound.     Objective:   Physical Exam BP 112/72  Pulse 84  Temp(Src) 98.3 F (36.8 C) (Oral)  Ht 5\' 7"  (1.702 m)  Wt 309 lb 12 oz (140.502 kg)  BMI 48.51 kg/m2  SpO2 95%  LMP 06/26/2011 Physical Exam  VS noted, mild ill Constitutional: Pt appears well-developed and well-nourished.  HENT: Head: Normocephalic.  Right Ear: External ear normal.  Left Ear: External ear normal.  Bilat tm's mild erythema.  Sinus tender bilat.  Pharynx mild erythema Eyes: Conjunctivae and EOM are normal. Pupils are equal, round, and reactive to light.  Neck: Normal range of motion. Neck supple.  Cardiovascular: Normal rate and regular rhythm.   Pulmonary/Chest: Effort normal and breath sounds normal.  Neurological: Pt is alert. No cranial nerve deficit.  Skin: Skin is warm. No erythema.  Psychiatric: Pt behavior is normal. Thought content normal.  Left lateral epicondylar area mild tender, swollen, without erythema, drainage, and elbow with FROM       Assessment & Plan:

## 2011-07-09 ENCOUNTER — Ambulatory Visit: Payer: 59 | Admitting: Internal Medicine

## 2011-07-13 ENCOUNTER — Encounter: Payer: Self-pay | Admitting: Internal Medicine

## 2011-07-13 ENCOUNTER — Ambulatory Visit (INDEPENDENT_AMBULATORY_CARE_PROVIDER_SITE_OTHER): Payer: 59 | Admitting: Internal Medicine

## 2011-07-13 VITALS — BP 126/72 | HR 82 | Temp 98.1°F | Wt 306.0 lb

## 2011-07-13 DIAGNOSIS — E119 Type 2 diabetes mellitus without complications: Secondary | ICD-10-CM

## 2011-07-13 DIAGNOSIS — M7710 Lateral epicondylitis, unspecified elbow: Secondary | ICD-10-CM

## 2011-07-13 DIAGNOSIS — E785 Hyperlipidemia, unspecified: Secondary | ICD-10-CM

## 2011-07-13 DIAGNOSIS — M7712 Lateral epicondylitis, left elbow: Secondary | ICD-10-CM

## 2011-07-15 DIAGNOSIS — Z0279 Encounter for issue of other medical certificate: Secondary | ICD-10-CM

## 2011-07-18 ENCOUNTER — Encounter: Payer: Self-pay | Admitting: Internal Medicine

## 2011-07-18 NOTE — Assessment & Plan Note (Signed)
stable overall by hx and exam, most recent data reviewed with pt, and pt to continue medical treatment as before  Lab Results  Component Value Date   HGBA1C 5.6 07/05/2011

## 2011-07-18 NOTE — Progress Notes (Signed)
Subjective:    Patient ID: Nicole Melendez, female    DOB: January 13, 1976, 35 y.o.   MRN: [id number]  HPI  Here to f/u; overall doing ok, at least 50% improved left lateral epicondylar area pain/swelling without new symptoms such as neck or shoulder pain or more distal LUE pain;  Pt denies chest pain, increased sob or doe, wheezing, orthopnea, PND, increased LE swelling, palpitations, dizziness or syncope. Pt denies new neurological symptoms such as new headache, or facial or extremity weakness or numbness   Pt denies polydipsia, polyuria, or low sugar symptoms.  Trying to follow lower chol, DM diet.  Has seen rheum, Dr Dierdre Forth, asked to rest for up to 6 wks with warm compresses, and possible MRI if not improved, no steroid injection.  Last day worked sept 11, needs work note, wants to go back sept 24. Overall good compliance with treatment, and good medicine tolerability.  Past Medical History  Diagnosis Date  . Morbid obesity   . Peptic ulcer disease   . GERD (gastroesophageal reflux disease)   . Migraine   . Anxiety   . Anemia   . Hyperlipidemia   . Menorrhagia   . PNA (pneumonia) july 2011  . Diabetes mellitus type II     steroid related  . Depression   . Sarcoid     including hand per rheumatology-Dr. Dierdre Forth  . Allergic rhinitis, cause unspecified 02/17/2011  . Varicose veins with pain 02/17/2011   Past Surgical History  Procedure Date  . Uterine ablation 03/2010    reports that she quit smoking about 14 months ago. Her smoking use included Cigarettes. She has a 20 pack-year smoking history. She does not have any smokeless tobacco history on file. She reports that she drinks alcohol. She reports that she does not use illicit drugs. family history includes Allergies in her mother; Bone cancer in her maternal aunt; Breast cancer in her maternal aunt; Diabetes in an unspecified family member; Heart attack in her mother; Heart disease in her father; Hypertension in an unspecified family  member; Lung cancer in her maternal aunt; Ovarian cancer in her maternal aunt; and Rheum arthritis in her father. Allergies  Allergen Reactions  . Azithromycin     (z pak) hives  . Oxycodone-Acetaminophen     REACTION: difficulty breathing  . Penicillins     REACTION: swelling and difficulty breathing   Current Outpatient Prescriptions on File Prior to Visit  Medication Sig Dispense Refill  . acetaminophen (TYLENOL) 650 MG CR tablet 1-2 tablets by mouth 3 times a day as needed       . albuterol (PROAIR HFA) 108 (90 BASE) MCG/ACT inhaler 2 puffs up to 4 times a day as needed       . ALPRAZolam (XANAX) 0.5 MG tablet 1 by mouth 2 times a day as needed  60 tablet  2  . atorvastatin (LIPITOR) 10 MG tablet Take 1 tablet (10 mg total) by mouth daily.  90 tablet  3  . B-D UF III MINI PEN NEEDLES 31G X 5 MM MISC       . CELEBREX 200 MG capsule Once a day as needed      . cetirizine (ZYRTEC) 10 MG tablet Take 10 mg by mouth daily.        Marland Kitchen doxycycline (VIBRA-TABS) 100 MG tablet Take 1 tablet (100 mg total) by mouth 2 (two) times daily.  20 tablet  0  . FLUoxetine (PROZAC) 20 MG capsule Take 3 tabs by mouth  per day  90 capsule  11  . fluticasone (FLONASE) 50 MCG/ACT nasal spray 2 sprays by Nasal route daily.  16 g  2  . ONE TOUCH ULTRA TEST test strip       . ONETOUCH DELICA LANCETS MISC       . predniSONE (DELTASONE) 10 MG tablet Take 10 mg by mouth daily.        . predniSONE (DELTASONE) 5 MG tablet Take as directed  100 tablet  1  . SUMAtriptan (IMITREX) 100 MG tablet Take 1 tablet (100 mg total) by mouth every 2 (two) hours as needed for migraine.  10 tablet  5  . SYMBICORT 160-4.5 MCG/ACT inhaler INHALE 2 PUFFS BY MOUTH TWICE DAILY  10.2 g  6  . traMADol (ULTRAM) 50 MG tablet Take 1 tablet (50 mg total) by mouth every 6 (six) hours as needed for pain.  60 tablet  1  . varenicline (CHANTIX CONTINUING MONTH PAK) 1 MG tablet Take 1 tablet (1 mg total) by mouth 2 (two) times daily.  60 tablet  0    . varenicline (CHANTIX STARTING MONTH PAK) 0.5 MG X 11 & 1 MG X 42 tablet Take one 0.5mg  tablet by mouth once daily for 3 days, then increase to one 0.5mg  tablet twice daily for 3 days, then increase to one 1mg  tablet twice daily.  53 tablet  0  . zolpidem (AMBIEN CR) 12.5 MG CR tablet TAKE ONE TABLET BY MOUTH AT BEDTIME AS NEEDED  30 tablet  5  . furosemide (LASIX) 20 MG tablet Take 20 mg by mouth daily as needed.        . methotrexate (RHEUMATREX) 2.5 MG tablet Take 4 tabs by mouth once weekly.  18 tablet  3  . NOVOLOG FLEXPEN 100 UNIT/ML injection as needed.       Marland Kitchen omeprazole (PRILOSEC) 20 MG capsule TAKE 2 CAPSULES BY MOUTH ONCE DAILY  60 capsule  11  . sitaGLIPtan-metformin (JANUMET) 50-500 MG per tablet Take 1 tablet by mouth daily after breakfast.  90 tablet  3   Review of Systems Review of Systems  Constitutional: Negative for diaphoresis and unexpected weight change.  HENT: Negative for drooling and tinnitus.   Eyes: Negative for photophobia and visual disturbance.  Respiratory: Negative for choking and stridor.   Gastrointestinal: Negative for vomiting and blood in stool.  Genitourinary: Negative for hematuria and decreased urine volume.     Objective:   Physical Exam BP 126/72  Pulse 82  Temp(Src) 98.1 F (36.7 C) (Oral)  Wt 306 lb (138.801 kg)  SpO2 97%  LMP 06/26/2011 Physical Exam  VS noted Constitutional: Pt appears well-developed and well-nourished.  HENT: Head: Normocephalic.  Right Ear: External ear normal.  Left Ear: External ear normal.  Eyes: Conjunctivae and EOM are normal. Pupils are equal, round, and reactive to light.  Neck: Normal range of motion. Neck supple.  Cardiovascular: Normal rate and regular rhythm.   Pulmonary/Chest: Effort normal and breath sounds normal.  Neurological: Pt is alert. No cranial nerve deficit.  Skin: Skin is warm. No erythema.  Psychiatric: Pt behavior is normal. Thought content normal.  LUE with mild tender/swelling only to  left lateral epicondylar area - o/w neurovasc intact       Assessment & Plan:

## 2011-07-18 NOTE — Assessment & Plan Note (Signed)
stable overall by hx and exam, most recent data reviewed with pt, and pt to continue medical treatment as before  Lab Results  Component Value Date   LDLCALC 111* 01/06/2011

## 2011-07-18 NOTE — Patient Instructions (Addendum)
Continue all other medications as before  

## 2011-07-18 NOTE — Assessment & Plan Note (Signed)
Improved overall, Continue all other medications as before and treatment,  to f/u any worsening symptoms or concerns, work note given, pt reassured

## 2011-07-23 ENCOUNTER — Ambulatory Visit (INDEPENDENT_AMBULATORY_CARE_PROVIDER_SITE_OTHER): Payer: 59 | Admitting: Internal Medicine

## 2011-07-23 ENCOUNTER — Encounter: Payer: Self-pay | Admitting: Internal Medicine

## 2011-07-23 VITALS — BP 128/82 | HR 75 | Temp 98.9°F | Ht 67.0 in | Wt 311.4 lb

## 2011-07-23 DIAGNOSIS — M7712 Lateral epicondylitis, left elbow: Secondary | ICD-10-CM

## 2011-07-23 DIAGNOSIS — E119 Type 2 diabetes mellitus without complications: Secondary | ICD-10-CM

## 2011-07-23 DIAGNOSIS — M7710 Lateral epicondylitis, unspecified elbow: Secondary | ICD-10-CM

## 2011-07-23 DIAGNOSIS — B370 Candidal stomatitis: Secondary | ICD-10-CM

## 2011-07-23 DIAGNOSIS — G43909 Migraine, unspecified, not intractable, without status migrainosus: Secondary | ICD-10-CM

## 2011-07-23 MED ORDER — NYSTATIN 100000 UNIT/ML MT SUSP: 500000.0000 [IU] | Freq: Four times a day (QID) | OROMUCOSAL | Status: AC

## 2011-07-23 MED ORDER — PROMETHAZINE HCL 25 MG/ML IJ SOLN: 25.0000 mg | Freq: Every day | INTRAMUSCULAR | Status: DC

## 2011-07-23 MED ORDER — SITAGLIPTIN PHOS-METFORMIN HCL 50-500 MG PO TABS: 1.0000 | ORAL_TABLET | Freq: Two times a day (BID) | ORAL | Status: DC

## 2011-07-23 MED ORDER — PROMETHAZINE HCL 25 MG/ML IJ SOLN
25.0000 mg | Freq: Every day | INTRAMUSCULAR | Status: DC
  Administered 2011-07-23: 25 mg via INTRAMUSCULAR

## 2011-07-23 MED ORDER — MEPERIDINE HCL 50 MG/ML IJ SOLN
50.0000 mg | Freq: Once | INTRAMUSCULAR | Status: AC
  Administered 2011-07-23: 50 mg via INTRAMUSCULAR

## 2011-07-23 NOTE — Patient Instructions (Addendum)
You had the shot for the headache today - demerol/phenergan 50/25 mg Increase the janumet to twice per day - sent to the pharmacy Take all new medications as prescribed - the nystatin for the thrush You will be contacted regarding the referral for: Diabetic education You are given the going back to work note for oct 1 Continue all other medications as before - the prednisone and insulin Please call in 1 wk if sugars are still > 200 Please return in 3 mo with Lab testing done 3-5 days before

## 2011-07-23 NOTE — Assessment & Plan Note (Addendum)
Uncontrolled likely due to combination less active than at work, ongoing prednisone use, wt gain, decreased janumet and dietary noncompliance - , symptomatic unfortunately with rather labile BS - will need to return to bid janumet, and FMLA form filled out, also refer DM education, to return to work soon, encouraged dietary compliance, exercise, wt loss

## 2011-07-23 NOTE — Progress Notes (Signed)
Subjective:    Patient ID: Nicole Melendez, female    DOB: 1975-11-26, 35 y.o.   MRN: [id number]  HPI   Here to f/u;  Since decreasing her janumet recently to one per day since her A1c was < 6, pt admits to less dietary compliance and has gained several lbs, with recent sugars in the past wk many times over 300, assoc with mild polydipsia, polyuria, and blurred vision, such that she felt she was unable to go to work.   Pt denies fever, wt loss, night sweats, loss of appetite, or other constitutional symptoms   Denies urinary symptoms such as dysuria, frequency, urgency,or hematuria.  No ST, HA, cough.  Pt denies chest pain, increased sob or doe, wheezing, orthopnea, PND, increased LE swelling, palpitations, dizziness or syncope.  Pt denies new neurological symptoms such as new headache, or facial or extremity weakness or numbness    Overall left arm lateral epicondylitis near resolved after being away from work and resting from her probable repetitive motion injury and is set to return to work soon.   Denies worsening depressive symptoms, suicidal ideation, or panic, though has ongoing anxiety.  Also incidentally with migraine onset this AM , left sided pain, throbbing, with nausea, mod to severe, no vomiting bu with photophobia.  Also with new whitch coating to the tongue on chronic prednisone - ? thrush Past Medical History  Diagnosis Date  . Morbid obesity   . Peptic ulcer disease   . GERD (gastroesophageal reflux disease)   . Migraine   . Anxiety   . Anemia   . Hyperlipidemia   . Menorrhagia   . PNA (pneumonia) july 2011  . Diabetes mellitus type II     steroid related  . Depression   . Sarcoid     including hand per rheumatology-Dr. Dierdre Forth  . Allergic rhinitis, cause unspecified 02/17/2011  . Varicose veins with pain 02/17/2011   Past Surgical History  Procedure Date  . Uterine ablation 03/2010    reports that she quit smoking about 15 months ago. Her smoking use included Cigarettes.  She has a 20 pack-year smoking history. She does not have any smokeless tobacco history on file. She reports that she drinks alcohol. She reports that she does not use illicit drugs. family history includes Allergies in her mother; Bone cancer in her maternal aunt; Breast cancer in her maternal aunt; Diabetes in an unspecified family member; Heart attack in her mother; Heart disease in her father; Hypertension in an unspecified family member; Lung cancer in her maternal aunt; Ovarian cancer in her maternal aunt; and Rheum arthritis in her father. Allergies  Allergen Reactions  . Azithromycin     (z pak) hives  . Oxycodone-Acetaminophen     REACTION: difficulty breathing  . Penicillins     REACTION: swelling and difficulty breathing   Current Outpatient Prescriptions on File Prior to Visit  Medication Sig Dispense Refill  . acetaminophen (TYLENOL) 650 MG CR tablet 1-2 tablets by mouth 3 times a day as needed       . albuterol (PROAIR HFA) 108 (90 BASE) MCG/ACT inhaler 2 puffs up to 4 times a day as needed       . ALPRAZolam (XANAX) 0.5 MG tablet 1 by mouth 2 times a day as needed  60 tablet  2  . atorvastatin (LIPITOR) 10 MG tablet Take 1 tablet (10 mg total) by mouth daily.  90 tablet  3  . B-D UF III MINI PEN NEEDLES 31G  X 5 MM MISC       . CELEBREX 200 MG capsule Once a day as needed      . cetirizine (ZYRTEC) 10 MG tablet Take 10 mg by mouth daily.        Marland Kitchen FLUoxetine (PROZAC) 20 MG capsule Take 3 tabs by mouth per day  90 capsule  11  . fluticasone (FLONASE) 50 MCG/ACT nasal spray 2 sprays by Nasal route daily.  16 g  2  . furosemide (LASIX) 20 MG tablet Take 20 mg by mouth daily as needed.        . methotrexate (RHEUMATREX) 2.5 MG tablet Take 4 tabs by mouth once weekly.  18 tablet  3  . NOVOLOG FLEXPEN 100 UNIT/ML injection as needed.       Marland Kitchen omeprazole (PRILOSEC) 20 MG capsule TAKE 2 CAPSULES BY MOUTH ONCE DAILY  60 capsule  11  . ONE TOUCH ULTRA TEST test strip       . ONETOUCH  DELICA LANCETS MISC       . predniSONE (DELTASONE) 10 MG tablet Take 10 mg by mouth daily.        . predniSONE (DELTASONE) 5 MG tablet Take as directed  100 tablet  1  . SUMAtriptan (IMITREX) 100 MG tablet Take 1 tablet (100 mg total) by mouth every 2 (two) hours as needed for migraine.  10 tablet  5  . SYMBICORT 160-4.5 MCG/ACT inhaler INHALE 2 PUFFS BY MOUTH TWICE DAILY  10.2 g  6  . varenicline (CHANTIX CONTINUING MONTH PAK) 1 MG tablet Take 1 tablet (1 mg total) by mouth 2 (two) times daily.  60 tablet  0  . varenicline (CHANTIX STARTING MONTH PAK) 0.5 MG X 11 & 1 MG X 42 tablet Take one 0.5mg  tablet by mouth once daily for 3 days, then increase to one 0.5mg  tablet twice daily for 3 days, then increase to one 1mg  tablet twice daily.  53 tablet  0  . zolpidem (AMBIEN CR) 12.5 MG CR tablet TAKE ONE TABLET BY MOUTH AT BEDTIME AS NEEDED  30 tablet  5   No current facility-administered medications on file prior to visit.   Review of Systems Review of Systems  Constitutional: Negative for diaphoresis and unexpected weight change.  HENT: Negative for drooling and tinnitus.   Eyes: Negative for photophobia and visual disturbance.  Respiratory: Negative for choking and stridor.   Gastrointestinal: Negative for vomiting and blood in stool.  Genitourinary: Negative for hematuria and decreased urine volume.    Objective:   Physical Exam BP 128/82  Pulse 75  Temp(Src) 98.9 F (37.2 C) (Oral)  Ht 5\' 7"  (1.702 m)  Wt 311 lb 6 oz (141.239 kg)  BMI 48.77 kg/m2  SpO2 97%  LMP 06/26/2011 Physical Exam  VS noted, not ill appearing Constitutional: Pt appears well-developed and well-nourished.  HENT: Head: Normocephalic. Tongue with whitich coating Right Ear: External ear normal.  Left Ear: External ear normal.  Eyes: Conjunctivae and EOM are normal. Pupils are equal, round, and reactive to light.  Neck: Normal range of motion. Neck supple.  Cardiovascular: Normal rate and regular rhythm.     Pulmonary/Chest: Effort normal and breath sounds normal.  Abd:  Soft, NT, non-distended, + BS Neurological: Pt is alert. No cranial nerve deficit. motor/sens/dtr intact, gait intact Skin: Skin is warm. No erythema.  Psychiatric: Pt behavior is normal. Thought content normal. not depressed appaering, 1+ nervous Left lateral epicondyle trace swelling, tender only with LUE o/w neurovasc  intact    Assessment & Plan:

## 2011-07-23 NOTE — Assessment & Plan Note (Signed)
Improved, ok for return to work  - note given and form filled out for [employer] - out of work from [employer] , return oct 1

## 2011-07-24 ENCOUNTER — Other Ambulatory Visit: Payer: Self-pay | Admitting: Internal Medicine

## 2011-07-24 ENCOUNTER — Encounter: Payer: Self-pay | Admitting: Internal Medicine

## 2011-07-24 NOTE — Assessment & Plan Note (Signed)
Mild to mod, for antibx course - nystatin soln,  to f/u any worsening symptoms or concerns

## 2011-07-24 NOTE — Assessment & Plan Note (Signed)
Typical  - for demeraol/phenergan IM today,  to f/u any worsening symptoms or concerns

## 2011-08-01 ENCOUNTER — Other Ambulatory Visit: Payer: Self-pay | Admitting: Internal Medicine

## 2011-08-02 ENCOUNTER — Ambulatory Visit (INDEPENDENT_AMBULATORY_CARE_PROVIDER_SITE_OTHER): Payer: 59 | Admitting: Internal Medicine

## 2011-08-02 ENCOUNTER — Ambulatory Visit (INDEPENDENT_AMBULATORY_CARE_PROVIDER_SITE_OTHER)
Admission: RE | Admit: 2011-08-02 | Discharge: 2011-08-02 | Disposition: A | Discharge status: Home / Self Care | Payer: 59 | Source: Ambulatory Visit | Attending: Internal Medicine | Admitting: Internal Medicine

## 2011-08-02 ENCOUNTER — Encounter: Payer: Self-pay | Admitting: Internal Medicine

## 2011-08-02 DIAGNOSIS — E119 Type 2 diabetes mellitus without complications: Secondary | ICD-10-CM

## 2011-08-02 DIAGNOSIS — J209 Acute bronchitis, unspecified: Secondary | ICD-10-CM

## 2011-08-02 DIAGNOSIS — R062 Wheezing: Secondary | ICD-10-CM | POA: Insufficient documentation

## 2011-08-02 MED ORDER — LEVOFLOXACIN 500 MG PO TABS: 500.0000 mg | ORAL_TABLET | Freq: Every day | ORAL | Status: AC

## 2011-08-02 NOTE — Assessment & Plan Note (Signed)
Mild to mod, for antibx course,  to f/u any worsening symptoms or concerns, cant r/p pna -for cxr today

## 2011-08-02 NOTE — Patient Instructions (Signed)
Take all new medications as prescribed - the levaquin Please also start today prednisone at 30 mg for 3 days, 20 mg for 3 days, 10 mg for 3 days, then back to the baseline 5 mg per day Be aware that your blood sugars will be increased due to illness and the prednisone;  Be prepared to check your blood sugars at least 3 to 4 times per day and use the sliding scale Novolog as you normally do You are given the work note today Please go to XRAY in the Basement for the x-ray test Please call the phone number [phone] (the [employer] System) for results of testing in 2-3 days;  When calling, simply dial the number, and when prompted enter the MRN number above (the Medical Record Number) and the # key, then the message should start.

## 2011-08-02 NOTE — Assessment & Plan Note (Signed)
Now on daily baseline 5 mg prednisone, to increase to 30 mg for 3 days, 20 mg for 3 days, 10 mg for 3 days, then back to 5 mg after that, has plenty prednisone at home/no new rx needed today

## 2011-08-02 NOTE — Assessment & Plan Note (Signed)
Improved by cbg's since last visit, to cont but will need to aware of incrased need for insulin due to illness and prednisone asd     - to cont the SSI and all other meds, diet

## 2011-08-02 NOTE — Progress Notes (Signed)
Subjective:    Patient ID: Nicole Melendez, female    DOB: 1976-06-19, 35 y.o.   MRN: [id number]  HPI  Here with acute visit for 1 wk URI symptoms of malaise, fever, congestion colored d/c, now unfortunately worsening - Here with acute onset mild to mod 2-3 days ST, HA, general weakness and malaise, with prod cough greenish sputum, but Pt denies chest pain, increased sob or doe, wheezing, orthopnea, PND, increased LE swelling, palpitations, dizziness or syncope except for onset mild wheezing this am.  Pt denies new neurological symptoms such as new headache, or facial or extremity weakness or numbness   Pt denies polydipsia, polyuria, or low sugar symptoms such as weakness or confusion improved with po intake.  Pt states overall good compliance with meds, trying to follow lower cholesterol, diabetic diet, wt overall stable but little exercise however.    Since last visit, cbg's have been some improved, still on prednisone dialy 5 mg.  Only spent one day back at work oct 1, then ill since then, hoping to improve after several days but just getting worse. Past Medical History  Diagnosis Date  . Morbid obesity   . Peptic ulcer disease   . GERD (gastroesophageal reflux disease)   . Migraine   . Anxiety   . Anemia   . Hyperlipidemia   . Menorrhagia   . PNA (pneumonia) july 2011  . Diabetes mellitus type II     steroid related  . Depression   . Sarcoid     including hand per rheumatology-Dr. Dierdre Forth  . Allergic rhinitis, cause unspecified 02/17/2011  . Varicose veins with pain 02/17/2011   Past Surgical History  Procedure Date  . Uterine ablation 03/2010    reports that she quit smoking about 15 months ago. Her smoking use included Cigarettes. She has a 20 pack-year smoking history. She does not have any smokeless tobacco history on file. She reports that she drinks alcohol. She reports that she does not use illicit drugs. family history includes Allergies in her mother; Bone cancer in her  maternal aunt; Breast cancer in her maternal aunt; Diabetes in an unspecified family member; Heart attack in her mother; Heart disease in her father; Hypertension in an unspecified family member; Lung cancer in her maternal aunt; Ovarian cancer in her maternal aunt; and Rheum arthritis in her father. Allergies  Allergen Reactions  . Azithromycin     (z pak) hives  . Oxycodone-Acetaminophen     REACTION: difficulty breathing  . Penicillins     REACTION: swelling and difficulty breathing   Current Outpatient Prescriptions on File Prior to Visit  Medication Sig Dispense Refill  . acetaminophen (TYLENOL) 650 MG CR tablet 1-2 tablets by mouth 3 times a day as needed       . albuterol (PROAIR HFA) 108 (90 BASE) MCG/ACT inhaler 2 puffs up to 4 times a day as needed       . ALPRAZolam (XANAX) 0.5 MG tablet 1 by mouth 2 times a day as needed  60 tablet  2  . CELEBREX 200 MG capsule Once a day as needed      . cetirizine (ZYRTEC) 10 MG tablet Take 10 mg by mouth daily.        Marland Kitchen FLUoxetine (PROZAC) 20 MG capsule Take 3 tabs by mouth per day  90 capsule  11  . fluticasone (FLONASE) 50 MCG/ACT nasal spray 2 sprays by Nasal route daily.  16 g  2  . furosemide (LASIX) 20 MG  tablet Take 20 mg by mouth daily as needed.        . methotrexate (RHEUMATREX) 2.5 MG tablet Take 4 tabs by mouth once weekly.  18 tablet  3  . NOVOLOG FLEXPEN 100 UNIT/ML injection USE AS DIRECTED PER SLIDING SCALE  15 mL  11  . nystatin (MYCOSTATIN) 100000 UNIT/ML suspension Take 5 mLs (500,000 Units total) by mouth 4 (four) times daily.  60 mL  0  . omeprazole (PRILOSEC) 20 MG capsule TAKE 2 CAPSULES BY MOUTH ONCE DAILY  60 capsule  11  . ONE TOUCH ULTRA TEST test strip USE TO TEST FOUR TIMES DAILY  200 each  11  . ONETOUCH DELICA LANCETS MISC USE TO TEST FOUR TIMES DAILY  200 each  11  . predniSONE (DELTASONE) 10 MG tablet Take 10 mg by mouth daily.        . predniSONE (DELTASONE) 5 MG tablet Take as directed  100 tablet  1  .  sitaGLIPtan-metformin (JANUMET) 50-500 MG per tablet Take 1 tablet by mouth 2 (two) times daily with a meal.  180 tablet  3  . SUMAtriptan (IMITREX) 100 MG tablet Take 1 tablet (100 mg total) by mouth every 2 (two) hours as needed for migraine.  10 tablet  5  . SYMBICORT 160-4.5 MCG/ACT inhaler INHALE 2 PUFFS BY MOUTH TWICE DAILY  10.2 g  6  . varenicline (CHANTIX CONTINUING MONTH PAK) 1 MG tablet Take 1 tablet (1 mg total) by mouth 2 (two) times daily.  60 tablet  0  . varenicline (CHANTIX STARTING MONTH PAK) 0.5 MG X 11 & 1 MG X 42 tablet Take one 0.5mg  tablet by mouth once daily for 3 days, then increase to one 0.5mg  tablet twice daily for 3 days, then increase to one 1mg  tablet twice daily.  53 tablet  0  . zolpidem (AMBIEN CR) 12.5 MG CR tablet TAKE ONE TABLET BY MOUTH AT BEDTIME AS NEEDED  30 tablet  5  . atorvastatin (LIPITOR) 10 MG tablet Take 1 tablet (10 mg total) by mouth daily.  90 tablet  3  . B-D UF III MINI PEN NEEDLES 31G X 5 MM MISC USE FOUR TIMES DAILY AS NEEDED PER SLIDING SCALE  200 each  11   Current Facility-Administered Medications on File Prior to Visit  Medication Dose Route Frequency Provider Last Rate Last Dose  . promethazine (PHENERGAN) injection 25 mg  25 mg Intravenous Daily [name], MD      . promethazine (PHENERGAN) injection 25 mg  25 mg Intramuscular Daily [name], MD   25 mg at 07/23/11 1211   Review of Systems Review of Systems  Constitutional: Negative for diaphoresis and unexpected weight change.  HENT: Negative for drooling and tinnitus.   Eyes: Negative for photophobia and visual disturbance.  Respiratory: Negative for choking and stridor.   Gastrointestinal: Negative for vomiting and blood in stool.  Genitourinary: Negative for hematuria and decreased urine volume.     Objective:   Physical Exam BP 108/70  Pulse 89  Temp(Src) 98.2 F (36.8 C) (Oral)  Ht 5\' 7"  (1.702 m)  Wt 316 lb 6 oz (143.507 kg)  BMI 49.55 kg/m2  SpO2 97%  LMP  06/26/2011 Physical Exam  VS noted. Mild ill  Constitutional: Pt appears well-developed and well-nourished.  HENT: Head: Normocephalic.  Right Ear: External ear normal.  Left Ear: External ear normal.  Bilat tm's mild erythema.  Sinus nontender.  Pharynx mild erythema Eyes: Conjunctivae and EOM are  normal. Pupils are equal, round, and reactive to light.  Neck: Normal range of motion. Neck supple.  Cardiovascular: Normal rate and regular rhythm.   Pulmonary/Chest: Effort normal and breath sounds  Mild decrased with bilat wheeze  Neurological: Pt is alert. No cranial nerve deficit.  Skin: Skin is warm. No erythema.  Psychiatric: Pt behavior is normal. Thought content normal. 1+ nervous    Assessment & Plan:

## 2011-08-03 ENCOUNTER — Telehealth: Payer: Self-pay

## 2011-08-03 LAB — CBC
HCT: 26.3 — ABNORMAL LOW
Hemoglobin: 8.6 — ABNORMAL LOW
RBC: 3.98
RDW: 21 — ABNORMAL HIGH

## 2011-08-03 LAB — WET PREP, GENITAL
Clue Cells Wet Prep HPF POC: NONE SEEN
Trich, Wet Prep: NONE SEEN
Yeast Wet Prep HPF POC: NONE SEEN

## 2011-08-03 LAB — URINALYSIS, ROUTINE W REFLEX MICROSCOPIC
Glucose, UA: 100 — AB
Ketones, ur: NEGATIVE
Leukocytes, UA: NEGATIVE
Nitrite: NEGATIVE
Specific Gravity, Urine: 1.02
pH: 6

## 2011-08-03 LAB — URINE MICROSCOPIC-ADD ON: WBC, UA: NONE SEEN

## 2011-08-03 LAB — POCT PREGNANCY, URINE: Preg Test, Ur: NEGATIVE

## 2011-08-03 MED ORDER — BENZONATATE 100 MG PO CAPS: ORAL_CAPSULE | ORAL | Status: DC

## 2011-08-03 MED ORDER — PROMETHAZINE HCL 25 MG PO TABS: 25.0000 mg | ORAL_TABLET | Freq: Four times a day (QID) | ORAL | Status: DC | PRN

## 2011-08-03 NOTE — Telephone Encounter (Signed)
The patient received the work note for today. She called to inform she would like to be out for the entire week, dated 08/03/2011 through 08/06/2011. Please advise.

## 2011-08-03 NOTE — Telephone Encounter (Signed)
Ok for phenergan for nasuea, and tessalon perle for cough \ Done per [employer]

## 2011-08-03 NOTE — Telephone Encounter (Signed)
Called the patient informed prescription has been sent in. Patient is requesting a work note was unable to work today.

## 2011-08-03 NOTE — Telephone Encounter (Signed)
Patient called with a cough and cannot keep anything down. Please advise

## 2011-08-03 NOTE — Telephone Encounter (Signed)
Ok for note 

## 2011-08-03 NOTE — Telephone Encounter (Signed)
Ok for this , as there is possible pna on cxr

## 2011-08-04 NOTE — Telephone Encounter (Signed)
Completed letter called the patient informed to pickup at front desk.

## 2011-08-26 ENCOUNTER — Ambulatory Visit: Payer: 59 | Admitting: Internal Medicine

## 2011-08-26 ENCOUNTER — Encounter: Payer: Self-pay | Admitting: Internal Medicine

## 2011-08-26 ENCOUNTER — Ambulatory Visit (INDEPENDENT_AMBULATORY_CARE_PROVIDER_SITE_OTHER): Payer: 59 | Admitting: Internal Medicine

## 2011-08-26 ENCOUNTER — Encounter: Payer: Self-pay | Admitting: *Deleted

## 2011-08-26 VITALS — BP 110/82 | HR 98 | Temp 98.8°F | Wt 319.4 lb

## 2011-08-26 DIAGNOSIS — F411 Generalized anxiety disorder: Secondary | ICD-10-CM

## 2011-08-26 MED ORDER — ALPRAZOLAM 0.5 MG PO TABS: 0.5000 mg | ORAL_TABLET | Freq: Three times a day (TID) | ORAL | Status: DC | PRN

## 2011-08-26 MED ORDER — SERTRALINE HCL 50 MG PO TABS: 50.0000 mg | ORAL_TABLET | Freq: Every day | ORAL | Status: DC

## 2011-08-26 NOTE — Assessment & Plan Note (Signed)
Long history of same. Prior overlap with depression New panic symptoms last night reviewed. No evidence of other physiologic problem Recent increase in Prozac to max dose one month ago, maybe overstimulating Stop Prozac, start sertraline -new prescription electronically submitted Also increase alprazolam to 3 times a day as needed for the next 30 days Followup with primary care physician in next 4-6 weeks to review symptoms and medications, call sooner if problems Reviewed and denied SI/HI

## 2011-08-26 NOTE — Patient Instructions (Signed)
It was good to see you today. Work note provided to you for return to work November 1 Stop Prozac and start Zoloft (sertraline) Increase alprazolam to 3 times a day as needed Your prescription(s) have been submitted to your pharmacy. Please take as directed and contact our office if you believe you are having problem(s) with the medication(s). Followup with Dr. Jonny Ruiz in 4-6 weeks to review symptoms and medications, call sooner if problems

## 2011-08-26 NOTE — Progress Notes (Signed)
  Subjective:    Patient ID: Nicole Melendez, female    DOB: 1976-09-21, 35 y.o.   MRN: [id number]  Anxiety Presents for follow-up visit. Symptoms include dizziness, hyperventilation, nervous/anxious behavior, panic and shortness of breath. Patient reports no chest pain, confusion or suicidal ideas. Symptoms occur occasionally. The severity of symptoms is causing significant distress. The quality of sleep is non-restorative. Nighttime awakenings: one to two.   Compliance with medications is 76-100%.   Past Medical History  Diagnosis Date  . Morbid obesity   . Peptic ulcer disease   . GERD (gastroesophageal reflux disease)   . Migraine   . Anxiety   . Anemia   . Hyperlipidemia   . Menorrhagia   . PNA (pneumonia) july 2011  . Diabetes mellitus type II     steroid related  . Depression   . Sarcoid     including hand per rheumatology-Dr. Dierdre Forth  . Allergic rhinitis, cause unspecified 02/17/2011  . Varicose veins with pain 02/17/2011    Review of Systems  Respiratory: Positive for shortness of breath.   Cardiovascular: Negative for chest pain.  Neurological: Positive for dizziness.  Psychiatric/Behavioral: Negative for suicidal ideas and confusion. The patient is nervous/anxious.        Objective:   Physical Exam BP 110/82  Pulse 98  Temp(Src) 98.8 F (37.1 C) (Oral)  Wt 319 lb 6.4 oz (144.879 kg)  SpO2 99% Wt Readings from Last 3 Encounters:  08/26/11 319 lb 6.4 oz (144.879 kg)  08/02/11 316 lb 6 oz (143.507 kg)  07/23/11 311 lb 6 oz (141.239 kg)   Constitutional: She is overweight - but appears well-developed and well-nourished. No distress.  Cardiovascular: Normal rate, regular rhythm and normal heart sounds.  No murmur heard. No BLE edema. Pulmonary/Chest: Effort normal and breath sounds normal. No respiratory distress. She has no wheezes. Psychiatric: She has a normal mood and slightly anxious affect. Her behavior is normal. Judgment and thought content normal.    Lab Results  Component Value Date   WBC 8.1 07/05/2011   HGB 11.0* 07/05/2011   HCT 35.7* 07/05/2011   PLT 441.0* 07/05/2011   GLUCOSE 102* 07/05/2011   CHOL 205* 07/05/2011   TRIG 164.0* 07/05/2011   HDL 53.40 07/05/2011   LDLDIRECT 135.0 07/05/2011   LDLCALC 111* 01/06/2011   ALT 19 07/05/2011   AST 20 07/05/2011   NA 139 07/05/2011   K 3.9 07/05/2011   CL 107 07/05/2011   CREATININE 1.0 07/05/2011   BUN 15 07/05/2011   CO2 24 07/05/2011   TSH 2.23 01/06/2011   INR 1.34 05/13/2010   HGBA1C 5.6 07/05/2011        Assessment & Plan:  See problem list. Medications and labs reviewed today.

## 2011-09-03 ENCOUNTER — Other Ambulatory Visit: Payer: Self-pay | Admitting: Pulmonary Disease

## 2011-09-24 ENCOUNTER — Ambulatory Visit: Payer: 59 | Admitting: Pulmonary Disease

## 2011-09-25 ENCOUNTER — Other Ambulatory Visit: Payer: Self-pay | Admitting: Internal Medicine

## 2011-09-27 NOTE — Telephone Encounter (Signed)
Done hardcopy to robin  

## 2011-09-28 NOTE — Telephone Encounter (Signed)
Faxed hardcopy to pharmacy. 

## 2011-10-05 ENCOUNTER — Other Ambulatory Visit: Payer: Self-pay | Admitting: Pulmonary Disease

## 2011-10-06 ENCOUNTER — Other Ambulatory Visit: Payer: Self-pay | Admitting: Internal Medicine

## 2011-10-08 ENCOUNTER — Telehealth: Payer: Self-pay | Admitting: Pulmonary Disease

## 2011-10-08 ENCOUNTER — Ambulatory Visit: Payer: 59 | Admitting: Pulmonary Disease

## 2011-10-08 NOTE — Telephone Encounter (Signed)
Called the pts pharmacy and they have a rx ready for her.  Called and spoke with pt and she is aware to keep her appt with [facility] and that the pharmacy has this ready for her.

## 2011-10-12 ENCOUNTER — Encounter: Payer: Self-pay | Admitting: Pulmonary Disease

## 2011-10-12 ENCOUNTER — Ambulatory Visit (INDEPENDENT_AMBULATORY_CARE_PROVIDER_SITE_OTHER): Payer: 59 | Admitting: Pulmonary Disease

## 2011-10-12 VITALS — BP 106/70 | HR 118 | Temp 98.2°F | Ht 67.0 in | Wt 331.2 lb

## 2011-10-12 DIAGNOSIS — D869 Sarcoidosis, unspecified: Secondary | ICD-10-CM

## 2011-10-12 NOTE — Patient Instructions (Signed)
Continue on methotrexate and prednisone Make sure you are taking folic acid daily, and need to check labwork monthly Work on weight loss followup with me in 4mos

## 2011-10-12 NOTE — Progress Notes (Signed)
  Subjective:    Patient ID: Nicole Melendez, female    DOB: Aug 20, 1976, 35 y.o.   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  She is being managed on low-dose methotrexate and prednisone, and has done very well with this treatment regimen.  She has had no symptoms consistent with a sarcoid flare, and her level of dyspnea on exertion is primarily related to her obesity and deconditioning.  She has done a good job staying away from cigarettes. Unfortunately she has gained 30 pounds since her last visit here.  She also has not kept up with her blood work since being on methotrexate, and I reminded her this needs to be done monthly.  She did have recent blood work with her rheumatologist, and I will get this information.  Review of Systems  Constitutional: Positive for appetite change and unexpected weight change. Negative for fever.  HENT: Positive for congestion and sneezing. Negative for ear pain, nosebleeds, sore throat, rhinorrhea, trouble swallowing, dental problem, postnasal drip and sinus pressure.   Eyes: Negative for redness and itching.  Respiratory: Positive for cough. Negative for chest tightness, shortness of breath and wheezing.   Cardiovascular: Negative for palpitations and leg swelling.  Gastrointestinal: Negative for nausea and vomiting.  Genitourinary: Negative for dysuria.  Musculoskeletal: Negative for joint swelling.  Skin: Negative for rash.  Neurological: Negative for headaches.  Hematological: Negative for adenopathy. Bruises/bleeds easily.  Psychiatric/Behavioral: Positive for dysphoric mood. The patient is nervous/anxious.        Objective:   Physical Exam Obese female in no acute distress Nose without purulence or discharge noted Chest is totally clear to auscultation, no wheezes or rhonchi Cardiac exam with regular rate and rhythm Lower extremities without edema, no cyanosis noted Alert and oriented, moves all 4  extremities.       Assessment & Plan:

## 2011-10-12 NOTE — Assessment & Plan Note (Signed)
The patient is doing fairly well from a pulmonary standpoint on her current regimen.  I have asked her to continue on this, and to work aggressively on weight loss and some type of conditioning program.  There is really no set time that she needs to be treated for her sarcoid, but I would typically do this for 6-12 months minimum and see how she responds.  I have reminded her that she needs to stay on folic acid, and also have her monthly lab work done while she is on methotrexate.

## 2011-10-15 ENCOUNTER — Telehealth: Payer: Self-pay | Admitting: Pulmonary Disease

## 2011-10-15 NOTE — Telephone Encounter (Signed)
LMOMTCB x 1 

## 2011-10-18 NOTE — Telephone Encounter (Signed)
LMOMTCB at home # and mobile #

## 2011-10-20 NOTE — Telephone Encounter (Signed)
LMOMTCB x 1 

## 2011-10-21 NOTE — Telephone Encounter (Signed)
Lm advising we have tried to contact her multiple times and has not had a response and to call back if anything further was needed

## 2011-11-24 ENCOUNTER — Encounter: Payer: Self-pay | Admitting: Internal Medicine

## 2011-11-24 ENCOUNTER — Other Ambulatory Visit (INDEPENDENT_AMBULATORY_CARE_PROVIDER_SITE_OTHER): Payer: 59

## 2011-11-24 ENCOUNTER — Ambulatory Visit (INDEPENDENT_AMBULATORY_CARE_PROVIDER_SITE_OTHER): Payer: 59 | Admitting: Internal Medicine

## 2011-11-24 VITALS — BP 120/78 | HR 95 | Temp 99.7°F | Ht 67.0 in | Wt 333.4 lb

## 2011-11-24 DIAGNOSIS — D869 Sarcoidosis, unspecified: Secondary | ICD-10-CM

## 2011-11-24 DIAGNOSIS — J209 Acute bronchitis, unspecified: Secondary | ICD-10-CM | POA: Insufficient documentation

## 2011-11-24 DIAGNOSIS — E119 Type 2 diabetes mellitus without complications: Secondary | ICD-10-CM

## 2011-11-24 DIAGNOSIS — F411 Generalized anxiety disorder: Secondary | ICD-10-CM

## 2011-11-24 DIAGNOSIS — R062 Wheezing: Secondary | ICD-10-CM | POA: Insufficient documentation

## 2011-11-24 LAB — CBC WITH DIFFERENTIAL/PLATELET
Basophils Absolute: 0 10*3/uL (ref 0.0–0.1)
Eosinophils Relative: 2.3 % (ref 0.0–5.0)
HCT: 30 % — ABNORMAL LOW (ref 36.0–46.0)
Lymphocytes Relative: 6.8 % — ABNORMAL LOW (ref 12.0–46.0)
Monocytes Relative: 5.8 % (ref 3.0–12.0)
Neutrophils Relative %: 85.1 % — ABNORMAL HIGH (ref 43.0–77.0)
Platelets: 308 10*3/uL (ref 150.0–400.0)
RDW: 21.4 % — ABNORMAL HIGH (ref 11.5–14.6)
WBC: 8.5 10*3/uL (ref 4.5–10.5)

## 2011-11-24 LAB — HEPATIC FUNCTION PANEL
ALT: 19 U/L (ref 0–35)
AST: 25 U/L (ref 0–37)
Alkaline Phosphatase: 126 U/L — ABNORMAL HIGH (ref 39–117)
Bilirubin, Direct: 0.1 mg/dL (ref 0.0–0.3)
Total Bilirubin: 0.4 mg/dL (ref 0.3–1.2)

## 2011-11-24 MED ORDER — LEVOFLOXACIN 250 MG PO TABS: 250.0000 mg | ORAL_TABLET | Freq: Every day | ORAL | Status: AC

## 2011-11-24 MED ORDER — LEVALBUTEROL TARTRATE 45 MCG/ACT IN AERO: 1.0000 | INHALATION_SPRAY | RESPIRATORY_TRACT | Status: DC | PRN

## 2011-11-24 MED ORDER — LEVOFLOXACIN 250 MG PO TABS: 250.0000 mg | ORAL_TABLET | Freq: Every day | ORAL | Status: DC

## 2011-11-24 NOTE — Progress Notes (Signed)
Subjective:    Patient ID: Nicole Melendez, female    DOB: 1976/02/03, 36 y.o.   MRN: [id number]  HPI  Here to f/u;  States she thinks she had panic attack with episode of sweats, tachypnea, and elev HR at work, and had to be let go home jan 28th partial day.  BS 154 at the time, went away when got home. Had 2 furhter similar episode last night, and one this am, did not try to go to work today.  Also incidentally with low grade temp today, with prod cough greenish sputum for last 3-4 days, mild to mod but using the symbicort more whille at work b/c the rescue inhaler makes her jittery at work.but Pt denies orthopnea, PND, increased LE swelling, palpitations, dizziness or syncope.    Pt denies other fever, wt loss, night sweats, loss of appetite, or other constitutional symptoms.  Overall good compliance with treatment, and good medicine tolerability. Past Medical History  Diagnosis Date  . Morbid obesity   . Peptic ulcer disease   . GERD (gastroesophageal reflux disease)   . Migraine   . Anxiety   . Anemia   . Hyperlipidemia   . Menorrhagia   . PNA (pneumonia) july 2011  . Diabetes mellitus type II     steroid related  . Depression   . Sarcoid     including hand per rheumatology-Dr. Dierdre Forth  . Allergic rhinitis, cause unspecified 02/17/2011  . Varicose veins with pain 02/17/2011   Past Surgical History  Procedure Date  . Uterine ablation 03/2010    reports that she quit smoking about 19 months ago. Her smoking use included Cigarettes. She has a 20 pack-year smoking history. She does not have any smokeless tobacco history on file. She reports that she drinks alcohol. She reports that she does not use illicit drugs. family history includes Allergies in her mother; Bone cancer in her maternal aunt; Breast cancer in her maternal aunt; Diabetes in an unspecified family member; Heart attack in her mother; Heart disease in her father; Hypertension in an unspecified family member; Lung cancer in  her maternal aunt; Ovarian cancer in her maternal aunt; and Rheum arthritis in her father. Allergies  Allergen Reactions  . Azithromycin     (z pak) hives  . Oxycodone-Acetaminophen     REACTION: difficulty breathing  . Penicillins     REACTION: swelling and difficulty breathing   Current Outpatient Prescriptions on File Prior to Visit  Medication Sig Dispense Refill  . acetaminophen (TYLENOL) 650 MG CR tablet 1-2 tablets by mouth 3 times a day as needed       . ALPRAZolam (XANAX) 0.5 MG tablet Take 1 tablet (0.5 mg total) by mouth 3 (three) times daily as needed for sleep or anxiety.  90 tablet  0  . atorvastatin (LIPITOR) 10 MG tablet Take 1 tablet (10 mg total) by mouth daily.  90 tablet  3  . B-D UF III MINI PEN NEEDLES 31G X 5 MM MISC USE FOUR TIMES DAILY AS NEEDED PER SLIDING SCALE  200 each  11  . CELEBREX 200 MG capsule Once a day as needed      . cetirizine (ZYRTEC) 10 MG tablet Take 10 mg by mouth daily.        . cyclobenzaprine (FLEXERIL) 5 MG tablet Take 5 mg by mouth as needed.        . fluticasone (FLONASE) 50 MCG/ACT nasal spray 2 sprays by Nasal route daily.  16 g  2  . furosemide (LASIX) 20 MG tablet Take 20 mg by mouth daily as needed.        . methotrexate (RHEUMATREX) 2.5 MG tablet TAKE 4 TABLETS BY MOUTH ONCE WEEKLY  18 tablet  0  . NOVOLOG FLEXPEN 100 UNIT/ML injection USE AS DIRECTED PER SLIDING SCALE  15 mL  11  . omeprazole (PRILOSEC) 20 MG capsule TAKE 2 CAPSULES BY MOUTH ONCE DAILY  60 capsule  11  . ONE TOUCH ULTRA TEST test strip USE TO TEST FOUR TIMES DAILY  200 each  11  . ONETOUCH DELICA LANCETS MISC USE TO TEST FOUR TIMES DAILY  200 each  11  . sertraline (ZOLOFT) 50 MG tablet Take 1 tablet (50 mg total) by mouth daily.  30 tablet  2  . sitaGLIPtan-metformin (JANUMET) 50-500 MG per tablet Take 1 tablet by mouth daily.        . SUMAtriptan (IMITREX) 100 MG tablet Take 1 tablet (100 mg total) by mouth every 2 (two) hours as needed for migraine.  10 tablet  5   . SYMBICORT 160-4.5 MCG/ACT inhaler INHALE 2 PUFFS BY MOUTH TWICE DAILY  10.2 g  6  . traMADol (ULTRAM) 50 MG tablet Take 50 mg by mouth as needed. Maximum dose= 8 tablets per day       . zolpidem (AMBIEN CR) 12.5 MG CR tablet TAKE 1 TABLET BY MOUTH AT BEDTIME AS NEEDED  30 tablet  5  . benzonatate (TESSALON PERLES) 100 MG capsule 1-2 tabs by mouth up to three times per day as needed for cough  90 capsule  1  . predniSONE (DELTASONE) 10 MG tablet One a day        Review of Systems Review of Systems  Constitutional: Negative for diaphoresis and unexpected weight change.  HENT: Negative for drooling and tinnitus.   Eyes: Negative for photophobia and visual disturbance.  Respiratory: Negative for choking and stridor.   Gastrointestinal: Negative for vomiting and blood in stool.  Genitourinary: Negative for hematuria and decreased urine volume.     Objective:   Physical Exam BP 120/78  Pulse 95  Temp(Src) 99.7 F (37.6 C) (Oral)  Ht 5\' 7"  (1.702 m)  Wt 333 lb 6 oz (151.218 kg)  BMI 52.21 kg/m2  SpO2 98% Physical Exam  VS noted, mild ill Constitutional: Pt appears well-developed and well-nourished.  HENT: Head: Normocephalic.  Right Ear: External ear normal.  Left Ear: External ear normal.  Bilat tm's mild erythema.  Sinus nontender.  Pharynx mild erythema Eyes: Conjunctivae and EOM are normal. Pupils are equal, round, and reactive to light.  Neck: Normal range of motion. Neck supple.  Cardiovascular: Normal rate and regular rhythm.   Pulmonary/Chest: Effort normal and breath sounds normal.  Abd:  Soft, NT, non-distended, + BS Neurological: Pt is alert. No cranial nerve deficit.  Skin: Skin is warm. No erythema.  Psychiatric: Pt behavior is normal. Thought content normal. 2-3+ nervous    Assessment & Plan:

## 2011-11-24 NOTE — Patient Instructions (Signed)
You are given the work note today Take all new medications as prescribed - the antibiotic Please increase the prednisone to 10 mg per day for 1 wk only, then back to the 5 mg per day OK to stop the albuterol inhaler Please use the Xopenex as directed as the rescue inhaler Please use the Symbicort only as prescribed Continue all other medications as before, except you can use the xanax RARELY up to 3 tabs per 8 hrs for panic attack

## 2011-11-24 NOTE — Assessment & Plan Note (Signed)
Currently on 5 mg prednisone, but with current illness, owuld recommend incr to 10 mg per day for 1 wk, then back to 5 mg; also change the alb rescue inhaler to Xopenex to hopefully help with daytime jitteriness, to take the symbicort asd only

## 2011-11-24 NOTE — Assessment & Plan Note (Addendum)
With recent panic attacks likely multifact with anxiety, acute illness, and possible med side effect;  Gave work note for illness, change to xopenex as above, and authorized for RARE use of xanax .5 gm - 1-3 as needed, but should not need often

## 2011-11-26 ENCOUNTER — Encounter: Payer: Self-pay | Admitting: Internal Medicine

## 2011-11-26 ENCOUNTER — Ambulatory Visit (INDEPENDENT_AMBULATORY_CARE_PROVIDER_SITE_OTHER): Payer: 59 | Admitting: Internal Medicine

## 2011-11-26 DIAGNOSIS — E119 Type 2 diabetes mellitus without complications: Secondary | ICD-10-CM

## 2011-11-26 DIAGNOSIS — B370 Candidal stomatitis: Secondary | ICD-10-CM

## 2011-11-26 DIAGNOSIS — J209 Acute bronchitis, unspecified: Secondary | ICD-10-CM

## 2011-11-26 DIAGNOSIS — A09 Infectious gastroenteritis and colitis, unspecified: Secondary | ICD-10-CM

## 2011-11-26 DIAGNOSIS — A084 Viral intestinal infection, unspecified: Secondary | ICD-10-CM

## 2011-11-26 MED ORDER — NYSTATIN 100000 UNIT/ML MT SUSP: 500000.0000 [IU] | Freq: Four times a day (QID) | OROMUCOSAL | Status: AC

## 2011-11-26 MED ORDER — DIPHENOXYLATE-ATROPINE 2.5-0.025 MG PO TABS: 1.0000 | ORAL_TABLET | Freq: Four times a day (QID) | ORAL | Status: AC | PRN

## 2011-11-26 NOTE — Progress Notes (Signed)
  Subjective:    Patient ID: Nicole Melendez, female    DOB: 1976-08-26, 36 y.o.   MRN: [id number]  HPI  Here for cold symptoms Seen 2 days ago by PCP for bronchospasm and anxiety rx'd levaquin x 10d and increase pred from 5>10 qd and change Alb to xopinex MDI Breathing better but "severe" diarrhea last 24h - no nausea and vomiting   Past Medical History  Diagnosis Date  . Morbid obesity   . Peptic ulcer disease   . GERD (gastroesophageal reflux disease)   . Migraine   . Anxiety   . Anemia   . Hyperlipidemia   . Menorrhagia   . PNA (pneumonia) july 2011  . Diabetes mellitus type II     steroid related  . Depression   . Sarcoid     including hand per rheumatology-Dr. Dierdre Forth  . Allergic rhinitis, cause unspecified 02/17/2011  . Varicose veins with pain 02/17/2011    Review of Systems  Constitutional: Positive for fever and fatigue. Negative for activity change and appetite change.  Gastrointestinal: Positive for abdominal pain and diarrhea (profuse x 24h). Negative for nausea, vomiting, constipation and rectal pain.       Objective:   Physical Exam BP 122/80  Pulse 99  Temp(Src) 99.1 F (37.3 C) (Oral)  SpO2 99% Wt Readings from Last 3 Encounters:  11/24/11 333 lb 6 oz (151.218 kg)  10/12/11 331 lb 3.2 oz (150.231 kg)  08/26/11 319 lb 6.4 oz (144.879 kg)   Constitutional: Obese. She appears fatigued but well-developed and well-nourished. No distress.  HENT: Head: Normocephalic and atraumatic. Ears: B TMs ok, no erythema or effusion; Nose: Nose normal. Mouth/Throat: Mild oral thrush. No oropharyngeal exudate.  Neck: Normal range of motion. Neck supple. No JVD present. No thyromegaly present.  Cardiovascular: Normal rate, regular rhythm and normal heart sounds.  No murmur heard. No BLE edema. Pulmonary/Chest: Effort normal and breath sounds normal. No respiratory distress. She has no wheezes.  Abdominal: Obese, Soft. Bowel sounds are normal. She exhibits no distension.  There is no tenderness. no masses   Lab Results  Component Value Date   WBC 8.5 11/24/2011   HGB 9.5* 11/24/2011   HCT 30.0* 11/24/2011   PLT 308.0 11/24/2011   GLUCOSE 102* 07/05/2011   CHOL 205* 07/05/2011   TRIG 164.0* 07/05/2011   HDL 53.40 07/05/2011   LDLDIRECT 135.0 07/05/2011   LDLCALC 111* 01/06/2011   ALT 19 11/24/2011   AST 25 11/24/2011   NA 139 07/05/2011   K 3.9 07/05/2011   CL 107 07/05/2011   CREATININE 1.0 07/05/2011   BUN 15 07/05/2011   CO2 24 07/05/2011   TSH 2.23 01/06/2011   INR 1.34 05/13/2010   HGBA1C 5.6 07/05/2011       Assessment & Plan:  Acute bronchitis - breathing seems improved - continue tx as begun by PCP 2 days ago  Oral thrush - nystatin swish and swallow  Diarrhea, suspect viral - reports "no help" with OTC meds - lomotil rx done - to call if worse - encouraged to maintain hydration efforts as ongoing  DM2 - patient advised on anticipated increase in cbgs due to steroid effect for next several days - pt will call if cbg>200

## 2011-11-26 NOTE — Patient Instructions (Signed)
It was good to see you today. Continue medications from Dr. Jonny Ruiz for your bronchitis symptoms Add nystatin solution for your thrush and use Lomotil for diarrhea as needed - Your prescription(s) have been submitted to your pharmacy. Please take as directed and contact our office if you believe you are having problem(s) with the medication(s). Keep hydrated, get plenty of rest and call if symptoms worse or unimproved Norovirus Infection Norovirus illness is caused by a viral infection. The term norovirus refers to a group of viruses. Any of those viruses can cause norovirus illness. This illness is often referred to by other names such as viral gastroenteritis, stomach flu, and food poisoning. Anyone can get a norovirus infection. People can have the illness multiple times during their lifetime. CAUSES   Norovirus is found in the stool or vomit of infected people. It is easily spread from person to person (contagious). People with norovirus are contagious from the moment they begin feeling ill. They may remain contagious for as long as 3 days to 2 weeks after recovery. People can become infected with the virus in several ways. This includes:  Eating food or drinking liquids that are contaminated with norovirus.     Touching surfaces or objects contaminated with norovirus, and then placing your hand in your mouth.     Having direct contact with a person who is infected and shows symptoms. This may occur while caring for someone with illness or while sharing foods or eating utensils with someone who is ill.  SYMPTOMS   Symptoms usually begin 1 to 2 days after ingestion of the virus. Symptoms may include:  Nausea.     Vomiting.    Diarrhea.    Stomach cramps.     Low-grade fever.     Chills.    Headache.    Muscle aches.     Tiredness.  Most people with norovirus illness get better within 1 to 2 days. Some people become dehydrated because they cannot drink enough liquids to replace  those lost from vomiting and diarrhea. This is especially true for young children, the elderly, and others who are unable to care for themselves. DIAGNOSIS   Diagnosis is based on your symptoms and exam. Currently, only state public health laboratories have the ability to test for norovirus in stool or vomit. TREATMENT   No specific treatment exists for norovirus infections. No vaccine is available to prevent infections. Norovirus illness is usually brief in healthy people. If you are ill with vomiting and diarrhea, you should drink enough water and fluids to keep your urine clear or pale yellow. Dehydration is the most serious health effect that can result from this infection. By drinking oral rehydration solution (ORS), people can reduce their chance of becoming dehydrated. There are many commercially available pre-made and powdered ORS designed to safely rehydrate people. These may be recommended by your caregiver. Replace any new fluid losses from diarrhea or vomiting with ORS as follows:  If your child weighs 10 kg or less (22 lb or less), give 60 to 120 ml ( to  cup or 2 to 4 oz) of ORS for each diarrheal stool or vomiting episode.     If your child weighs more than 10 kg (more than 22 lb), give 120 to 240 ml ( to 1 cup or 4 to 8 oz) of ORS for each diarrheal stool or vomiting episode.  HOME CARE INSTRUCTIONS    Follow all your caregiver's instructions.     Avoid sugar-free and  alcoholic drinks while ill.     Only take over-the-counter or prescription medicines for pain, vomiting, diarrhea, or fever as directed by your caregiver.  You can decrease your chances of coming in contact with norovirus or spreading it by following these steps:  Frequently wash your hands, especially after using the toilet, changing diapers, and before eating or preparing food.     Carefully wash fruits and vegetables. Cook shellfish before eating them.     Do not prepare food for others while you are  infected and for at least 3 days after recovering from illness.     Thoroughly clean and disinfect contaminated surfaces immediately after an episode of illness using a bleach-based household cleaner.     Immediately remove and wash clothing or linens that may be contaminated with the virus.     Use the toilet to dispose of any vomit or stool. Make sure the surrounding area is kept clean.     Food that may have been contaminated by an ill person should be discarded.  SEEK IMMEDIATE MEDICAL CARE IF:    You develop symptoms of dehydration that do not improve with fluid replacement. This may include:     Excessive sleepiness.     Lack of tears.     Dry mouth.     Dizziness when standing.     Weak pulse.  Document Released: 01/01/2003 Document Revised: 06/23/2011 Document Reviewed: 02/02/2010 [facility] Patient Information 2012 [place], [place].Norovirus Infection Norovirus illness is caused by a viral infection. The term norovirus refers to a group of viruses. Any of those viruses can cause norovirus illness. This illness is often referred to by other names such as viral gastroenteritis, stomach flu, and food poisoning. Anyone can get a norovirus infection. People can have the illness multiple times during their lifetime. CAUSES   Norovirus is found in the stool or vomit of infected people. It is easily spread from person to person (contagious). People with norovirus are contagious from the moment they begin feeling ill. They may remain contagious for as long as 3 days to 2 weeks after recovery. People can become infected with the virus in several ways. This includes:  Eating food or drinking liquids that are contaminated with norovirus.     Touching surfaces or objects contaminated with norovirus, and then placing your hand in your mouth.     Having direct contact with a person who is infected and shows symptoms. This may occur while caring for someone with illness or while sharing foods  or eating utensils with someone who is ill.  SYMPTOMS   Symptoms usually begin 1 to 2 days after ingestion of the virus. Symptoms may include:  Nausea.     Vomiting.    Diarrhea.    Stomach cramps.     Low-grade fever.     Chills.    Headache.    Muscle aches.     Tiredness.  Most people with norovirus illness get better within 1 to 2 days. Some people become dehydrated because they cannot drink enough liquids to replace those lost from vomiting and diarrhea. This is especially true for young children, the elderly, and others who are unable to care for themselves. DIAGNOSIS   Diagnosis is based on your symptoms and exam. Currently, only state public health laboratories have the ability to test for norovirus in stool or vomit. TREATMENT   No specific treatment exists for norovirus infections. No vaccine is available to prevent infections. Norovirus illness is usually brief  in healthy people. If you are ill with vomiting and diarrhea, you should drink enough water and fluids to keep your urine clear or pale yellow. Dehydration is the most serious health effect that can result from this infection. By drinking oral rehydration solution (ORS), people can reduce their chance of becoming dehydrated. There are many commercially available pre-made and powdered ORS designed to safely rehydrate people. These may be recommended by your caregiver. Replace any new fluid losses from diarrhea or vomiting with ORS as follows:  If your child weighs 10 kg or less (22 lb or less), give 60 to 120 ml ( to  cup or 2 to 4 oz) of ORS for each diarrheal stool or vomiting episode.     If your child weighs more than 10 kg (more than 22 lb), give 120 to 240 ml ( to 1 cup or 4 to 8 oz) of ORS for each diarrheal stool or vomiting episode.  HOME CARE INSTRUCTIONS    Follow all your caregiver's instructions.     Avoid sugar-free and alcoholic drinks while ill.     Only take over-the-counter or prescription  medicines for pain, vomiting, diarrhea, or fever as directed by your caregiver.  You can decrease your chances of coming in contact with norovirus or spreading it by following these steps:  Frequently wash your hands, especially after using the toilet, changing diapers, and before eating or preparing food.     Carefully wash fruits and vegetables. Cook shellfish before eating them.     Do not prepare food for others while you are infected and for at least 3 days after recovering from illness.     Thoroughly clean and disinfect contaminated surfaces immediately after an episode of illness using a bleach-based household cleaner.     Immediately remove and wash clothing or linens that may be contaminated with the virus.     Use the toilet to dispose of any vomit or stool. Make sure the surrounding area is kept clean.     Food that may have been contaminated by an ill person should be discarded.  SEEK IMMEDIATE MEDICAL CARE IF:    You develop symptoms of dehydration that do not improve with fluid replacement. This may include:     Excessive sleepiness.     Lack of tears.     Dry mouth.     Dizziness when standing.     Weak pulse.  Document Released: 01/01/2003 Document Revised: 06/23/2011 Document Reviewed: 02/02/2010 [facility] Patient Information 2012 [place], [place].

## 2011-11-28 ENCOUNTER — Encounter: Payer: Self-pay | Admitting: Internal Medicine

## 2011-11-28 NOTE — Assessment & Plan Note (Signed)
Mild to mod, for antibx course,  to f/u any worsening symptoms or concerns 

## 2011-11-28 NOTE — Assessment & Plan Note (Signed)
stable overall by hx and exam, most recent data reviewed with pt, and pt to continue medical treatment as before Lab Results  Component Value Date   HGBA1C 5.6 07/05/2011   To call for cbg > 200 on prednisone

## 2011-11-29 ENCOUNTER — Telehealth: Payer: Self-pay

## 2011-11-29 MED ORDER — METRONIDAZOLE 500 MG PO TABS: 500.0000 mg | ORAL_TABLET | Freq: Three times a day (TID) | ORAL | Status: AC

## 2011-11-29 NOTE — Telephone Encounter (Signed)
Patient informed. 

## 2011-11-29 NOTE — Telephone Encounter (Signed)
I would refer to Dr Felicity Coyer who saw her last

## 2011-11-29 NOTE — Telephone Encounter (Signed)
Patient was seen on Friday. She is still having nausea, diarrhea, body aches and some fever. Patient states she is no better and feeling worse. [phone]

## 2011-11-29 NOTE — Telephone Encounter (Signed)
Flagyl abx - may given 3 day work note extension but will need to see PCP if still unable to go back to work in 3 days time -thanks

## 2011-11-29 NOTE — Telephone Encounter (Signed)
Patient informed of MD's instructions. The patient informed to come by and pickup work note.

## 2011-11-29 NOTE — Telephone Encounter (Signed)
A user error has taken place: encounter opened in error, closed for administrative reasons.

## 2011-12-02 ENCOUNTER — Encounter: Payer: Self-pay | Admitting: Internal Medicine

## 2011-12-02 ENCOUNTER — Ambulatory Visit (INDEPENDENT_AMBULATORY_CARE_PROVIDER_SITE_OTHER): Payer: 59 | Admitting: Internal Medicine

## 2011-12-02 ENCOUNTER — Encounter: Payer: Self-pay | Admitting: *Deleted

## 2011-12-02 VITALS — BP 98/62 | HR 98 | Temp 98.2°F | Wt 333.1 lb

## 2011-12-02 DIAGNOSIS — D869 Sarcoidosis, unspecified: Secondary | ICD-10-CM

## 2011-12-02 DIAGNOSIS — IMO0001 Reserved for inherently not codable concepts without codable children: Secondary | ICD-10-CM

## 2011-12-02 DIAGNOSIS — J99 Respiratory disorders in diseases classified elsewhere: Secondary | ICD-10-CM

## 2011-12-02 DIAGNOSIS — E860 Dehydration: Secondary | ICD-10-CM

## 2011-12-02 DIAGNOSIS — D860 Sarcoidosis of lung: Secondary | ICD-10-CM

## 2011-12-02 DIAGNOSIS — A09 Infectious gastroenteritis and colitis, unspecified: Secondary | ICD-10-CM

## 2011-12-02 DIAGNOSIS — E1165 Type 2 diabetes mellitus with hyperglycemia: Secondary | ICD-10-CM

## 2011-12-02 NOTE — Patient Instructions (Signed)
It was good to see you today. Continue medications as reviewed - no changes Work note provided as discussed Keep hydrated, get plenty of rest and call if symptoms worse or unimproved Call Dr Shelle Iron for follow up sooner than 01/2012 for your sarcoid symptoms and recent bronchitis flare   Dehydration Dehydration is the reduction of water and fluid from the body to a level below that required for proper functioning. CAUSES   Dehydration occurs when there is excessive fluid loss from the body or when loss of normal fluids is not adequately replaced.  Loss of fluids occurs in vomiting, diarrhea, excessive sweating, excessive urine output, or excessive loss of fluid from the lungs (as occurs in fever or in patients on a ventilator).     Inadequate fluid replacement occurs with nausea or decreased appetite due to illness, sore throat, or mouth pain.  SYMPTOMS   Mild dehydration  Thirst (infants and young children may not be able to tell you they are thirsty).     Dry lips.     Slightly dry mouth membranes.  Moderate dehydration  Very dry mouth membranes.     Sunken eyes.     Sunken soft spot (fontanelle) on infant's head.     Skin does not bounce back quickly when lightly pinched and released.     Decreased urine production.     Decreased tear production.  Severe dehydration  Rapid, weak pulse (more than 100 beats per minute at rest).     Cold hands and feet.     Loss of ability to sweat in spite of heat and temperature.     Rapid breathing.     Blue lips.     Confusion, lethargy, difficult to arouse.     Minimal urine production.     No tears.  DIAGNOSIS   Your caregiver will diagnose dehydration based on your symptoms and your exam. Blood and urine tests will help confirm the diagnosis. The diagnostic evaluation should also identify the cause of dehydration. PREVENTION   The body depends on a proper balance of fluid and salts (electrolytes) for normal function.  Adequate fluid intake in the presence of illness or other stresses (such as extreme exercise) is important.   TREATMENT    Mild dehydration is safe to self-treat for most ages as long as it does not worsen. Contact your caregiver for even mild dehydration in infants and the elderly.     In teenagers and adults with moderate dehydration, careful home treatment (as outlined below) can be safe. Phone contact with a caregiver is advised. Children under 19 years of age with moderate dehydration should see a caregiver.     If you or your child is severely dehydrated, go to a hospital for treatment. Intravenous (IV) fluids will quickly reverse dehydration and are often lifesaving in young children, infants, and elderly persons.  HOME CARE INSTRUCTIONS   Small amounts of fluids should be taken frequently. Large amounts at one time may not be tolerated. Plain water may be harmful in infants and the elderly. Oral rehydration solutions (ORS) are available at pharmacies and grocery stores. ORS replaces water and important electrolytes in proper proportions. Sports drinks are not as effective as ORS and may be harmful because the sugar can make diarrhea worse.  As a general guideline for children, replace any new fluid losses from diarrhea and/or vomiting with ORS as follows:     If your child weighs 22 pounds or under (10 kg or less), give  60-120 mL (1/4-1/2 cup or 2-4 ounces) of ORS for each diarrheal stool or vomiting episode.     If your child weighs more than 22 pounds (more than 10 kg), give 120-240 mL (1/2-1 cup or 4-8 ounces) of ORS for each diarrheal stool or vomiting episode.     If your child is vomiting, it may be helpful to give the above ORS replacement in 5 mL (1 teaspoon) amounts every 5 minutes and increase as tolerated.     While correcting for dehydration, children should eat normally. However, foods high in sugar should be avoided because they may worsen diarrhea. Large amounts of  carbonated soft drinks, juice, gelatin desserts, and other highly sugared drinks should be avoided.     After correction of dehydration, other liquids that are appealing to the child may be added. Children should drink small amounts of fluids frequently and fluids should be increased as tolerated. Children should drink enough fluids to keep urine clear or pale yellow.     Adults should eat normally while drinking more fluids than usual. Drink small amounts of fluids frequently and increase the amount as tolerated. Drink enough fluids to keep urine clear or pale yellow. Broths, weak decaffeinated tea, lemon-lime soft drinks (allowed to go flat), and ORS replace fluids and electrolytes.     Avoid:    Carbonated drinks.     Juice.    Extremely hot or cold fluids.     Caffeine drinks.     Fatty, greasy foods.     Alcohol.    Tobacco.    Too much intake of anything at one time.     Gelatin desserts.     Probiotics are active cultures of beneficial bacteria. They may lessen the amount and number of diarrheal stools in adults. Probiotics can be found in yogurt with active cultures and in supplements.     Wash your hands well to avoid spreading germs (bacteria) and viruses.     Antidiarrheal medicines are not recommended for infants and children.     Only take over-the-counter or prescription medicines for pain, discomfort, or fever as directed by your caregiver. Do not give aspirin to children.     For adults with dehydration, ask your caregiver if you should continue all prescribed and over-the-counter medicines.     If your caregiver has given you a follow-up appointment, it is very important to keep that appointment. Not keeping the appointment could result in a lasting (chronic) or permanent injury and disability. If there is any problem keeping the appointment, you must call to reschedule.  SEEK IMMEDIATE MEDICAL CARE IF:    You are unable to keep fluids down or other symptoms  become worse despite treatment.     Vomiting or diarrhea develops and becomes persistent.     There is vomiting of blood or green matter (bile).     There is blood in the stool or the stools are black and tarry.     There is no urine output in 6 to 8 hours or there is only a small amount of very dark urine.     Abdominal pain develops, increases, or localizes.     You or your child has an oral temperature above 102 F (38.9 C), not controlled by medicine.     Your baby is older than 3 months with a rectal temperature of 102.66F (38.9 C) or higher.     Your baby is 64 months old or younger with a rectal  temperature of 100.4 F (38 C) or higher.     You develop excessive weakness, dizziness, fainting, or extreme thirst.     You develop a rash, stiff neck, severe headache, or you become irritable, sleepy, or difficult to awaken.  MAKE SURE YOU:    Understand these instructions.     Will watch your condition.     Will get help right away if you are not doing well or get worse.  Document Released: 10/11/2005 Document Revised: 04/26/2011 Document Reviewed: 09/09/2009 [facility] Patient Information 2012 [place], [place].

## 2011-12-02 NOTE — Progress Notes (Signed)
Subjective:    Patient ID: Nicole Melendez, female    DOB: September 06, 1976, 36 y.o.   MRN: [id number]  Wheezing  Pertinent negatives include no coughing, diarrhea, fever, shortness of breath or vomiting.    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 Morbid obesity   . Peptic ulcer disease   . GERD (gastroesophageal reflux disease)   . Migraine   . Anxiety   . Anemia   . Hyperlipidemia   . Menorrhagia   . PNA (pneumonia) july 2011  . Diabetes mellitus type II     steroid related  . Depression   . Sarcoid     including hand per rheumatology-Dr. Dierdre Forth  . Allergic rhinitis, cause unspecified   . Varicose veins with pain     Review of Systems  Constitutional: Positive for fatigue. Negative for fever, activity change and appetite change.  Respiratory: Negative for cough, shortness of breath and wheezing.   Gastrointestinal: Negative for nausea, vomiting, diarrhea, constipation and rectal pain.       Objective:   Physical Exam  BP 98/62  Pulse 98  Temp(Src) 98.2 F (36.8 C) (Oral)  Wt 333 lb 1.9 oz (151.102 kg)  SpO2 95% Wt Readings from Last 3 Encounters:  12/02/11 333 lb 1.9 oz (151.102 kg)  11/24/11 333 lb 6 oz (151.218 kg)  10/12/11 331 lb 3.2 oz (150.231 kg)   Constitutional: Obese. She appears fatigued but well-developed and well-nourished. No distress. s.o. At side Neck: Normal range of motion. Neck supple. No JVD present. No  thyromegaly present.  Cardiovascular: Normal rate, regular rhythm and normal heart sounds.  No murmur heard. No BLE edema. Pulmonary/Chest: Effort normal and breath sounds normal. No respiratory distress. She has no wheezes.  Abdominal: Obese, Soft. Bowel sounds are normal. She exhibits no distension. There is no tenderness. no masses   Lab Results  Component Value Date   WBC 8.5 11/24/2011   HGB 9.5* 11/24/2011   HCT 30.0* 11/24/2011   PLT 308.0 11/24/2011   GLUCOSE 102* 07/05/2011   CHOL 205* 07/05/2011   TRIG 164.0* 07/05/2011   HDL 53.40 07/05/2011   LDLDIRECT 135.0 07/05/2011   LDLCALC 111* 01/06/2011   ALT 19 11/24/2011   AST 25 11/24/2011   NA 139 07/05/2011   K 3.9 07/05/2011   CL 107 07/05/2011   CREATININE 1.0 07/05/2011   BUN 15 07/05/2011   CO2 24 07/05/2011   TSH 2.23 01/06/2011   INR 1.34 05/13/2010   HGBA1C 5.6 07/05/2011       Assessment & Plan:  Near syncope - mild component of dehydration following GI illness (resolve) - BP and HR stable - pulm symptoms imporved - continue hydration, no lasix and work note for last PM provided  Diarrhea, suspect viral -resolved -use prn lomotil and complete Flagyl as rx'd 2/3 - encouraged to maintain hydration efforts as ongoing  DM2 - patient advised on anticipated increase in cbgs due to steroid  effect- pt will call if cbg>200  pulm sarcoid - flare stabilizing following acute URI/bronchitis symptoms - advised follow up with pulm sooner than 01/2011  Time spent with pt/family today 25 minutes, greater than 50% time spent counseling patient on recent GI/pulm illness, DM2 with increase steroids and medication review. Also work note for acute time away due to illness

## 2011-12-03 ENCOUNTER — Ambulatory Visit (INDEPENDENT_AMBULATORY_CARE_PROVIDER_SITE_OTHER)
Admission: RE | Admit: 2011-12-03 | Discharge: 2011-12-03 | Disposition: A | Discharge status: Home / Self Care | Payer: 59 | Source: Ambulatory Visit | Attending: Internal Medicine | Admitting: Internal Medicine

## 2011-12-03 ENCOUNTER — Other Ambulatory Visit: Payer: Self-pay | Admitting: Internal Medicine

## 2011-12-03 ENCOUNTER — Other Ambulatory Visit: Payer: Self-pay | Admitting: Pulmonary Disease

## 2011-12-03 DIAGNOSIS — R05 Cough: Secondary | ICD-10-CM

## 2011-12-03 DIAGNOSIS — R509 Fever, unspecified: Secondary | ICD-10-CM

## 2011-12-03 DIAGNOSIS — R059 Cough, unspecified: Secondary | ICD-10-CM

## 2011-12-07 ENCOUNTER — Other Ambulatory Visit: Payer: Self-pay | Admitting: Internal Medicine

## 2011-12-10 ENCOUNTER — Other Ambulatory Visit: Payer: Self-pay | Admitting: Pulmonary Disease

## 2012-01-15 ENCOUNTER — Telehealth: Payer: Self-pay | Admitting: Pulmonary Disease

## 2012-01-15 NOTE — Telephone Encounter (Signed)
Pt has h/o sarcoidosis. Calls with a 3 week h/o cough, primarily occuring when lies down at night to sleep.  ++PND, denies reflux sx.  NO purulence from nares.  Very little mucus and no chest congestion.  On mtx, but no f/c/s.  Sounds more ua in origin than lower.  Asked her to use saline nasal rinses, take her tessalon pearls, and can try mucinex dm as well.  If cough not better by early next week, she is to call me at office.

## 2012-02-04 ENCOUNTER — Other Ambulatory Visit: Payer: Self-pay | Admitting: Internal Medicine

## 2012-02-04 ENCOUNTER — Other Ambulatory Visit: Payer: Self-pay | Admitting: Pulmonary Disease

## 2012-02-08 ENCOUNTER — Ambulatory Visit: Payer: 59 | Admitting: Pulmonary Disease

## 2012-02-10 ENCOUNTER — Emergency Department (HOSPITAL_COMMUNITY)
Admission: EM | Admit: 2012-02-10 | Discharge: 2012-02-10 | Disposition: A | Discharge status: Home / Self Care | Payer: 59 | Attending: Emergency Medicine | Admitting: Emergency Medicine

## 2012-02-10 ENCOUNTER — Other Ambulatory Visit: Payer: Self-pay | Admitting: Internal Medicine

## 2012-02-10 ENCOUNTER — Encounter (HOSPITAL_COMMUNITY): Payer: Self-pay | Admitting: Emergency Medicine

## 2012-02-10 DIAGNOSIS — E119 Type 2 diabetes mellitus without complications: Secondary | ICD-10-CM | POA: Insufficient documentation

## 2012-02-10 DIAGNOSIS — F411 Generalized anxiety disorder: Secondary | ICD-10-CM

## 2012-02-10 DIAGNOSIS — Z79899 Other long term (current) drug therapy: Secondary | ICD-10-CM | POA: Insufficient documentation

## 2012-02-10 DIAGNOSIS — M2550 Pain in unspecified joint: Secondary | ICD-10-CM | POA: Insufficient documentation

## 2012-02-10 DIAGNOSIS — D869 Sarcoidosis, unspecified: Secondary | ICD-10-CM | POA: Insufficient documentation

## 2012-02-10 MED ORDER — HYDROMORPHONE HCL PF 1 MG/ML IJ SOLN
1.0000 mg | Freq: Once | INTRAMUSCULAR | Status: AC
  Administered 2012-02-10: 1 mg via INTRAMUSCULAR
  Filled 2012-02-10: qty 1

## 2012-02-10 MED ORDER — ALPRAZOLAM 0.5 MG PO TABS: 0.5000 mg | ORAL_TABLET | Freq: Three times a day (TID) | ORAL | Status: DC | PRN

## 2012-02-10 MED ORDER — HYDROMORPHONE HCL 2 MG PO TABS: 2.0000 mg | ORAL_TABLET | ORAL | Status: DC | PRN

## 2012-02-10 MED ORDER — ALPRAZOLAM 0.5 MG PO TBDP: 0.5000 mg | ORAL_TABLET | Freq: Three times a day (TID) | ORAL | Status: DC | PRN

## 2012-02-10 NOTE — Telephone Encounter (Signed)
Faxed hardcopy to pharmacy. 

## 2012-02-10 NOTE — ED Notes (Signed)
Pt alert, nad, c/o joint pain, onset was this am, hx, of sarcoidosis, ambuulates to triage, steady gait noted

## 2012-02-10 NOTE — Telephone Encounter (Signed)
Done hardcopy to robin  

## 2012-02-10 NOTE — ED Provider Notes (Signed)
Medical screening examination/treatment/procedure(s) were performed by non-physician practitioner and as supervising physician I was immediately available for consultation/collaboration.   Ladaysha Soutar M Bevin Mayall, MD 02/10/12 0504 

## 2012-02-10 NOTE — ED Provider Notes (Signed)
History     CSN: [id number]  Arrival date & time 02/10/12  0309   First MD Initiated Contact with Patient 02/10/12 [phone]      Chief Complaint  Patient presents with  . 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  States this feels like a more intense version of her chronic pain.  Pain is exacerbated by using the joints and palpation.  Has taken ultram and celebrex without relief.  Denies fevers, trauma/injury.    The history is provided by the patient.    Past Medical History  Diagnosis Date  . Morbid obesity   . Peptic ulcer disease   . GERD (gastroesophageal reflux disease)   . Migraine   . Anxiety   . Anemia   . Hyperlipidemia   . Menorrhagia   . PNA (pneumonia) july 2011  . Diabetes mellitus type II     steroid related  . Depression   . Sarcoid     including hand per rheumatology-Dr. Dierdre Forth  . Allergic rhinitis, cause unspecified   . Varicose veins with pain     Past Surgical History  Procedure Date  . Uterine ablation 03/2010    Family History  Problem Relation Age of Onset  . Diabetes    . Hypertension    . Allergies Mother   . Heart disease Father   . Heart attack Mother   . Ovarian cancer Maternal Aunt   . Lung cancer Maternal Aunt   . Breast cancer Maternal Aunt   . Bone cancer Maternal Aunt   . Rheum arthritis Father     History  Substance Use Topics  . Smoking status: Former Smoker -- 1.0 packs/day for 20 years    Types: Cigarettes    Quit date: 04/24/2010  . Smokeless tobacco: Not on file   Comment: less than 1 ppd.  started at age 21.    Marland Kitchen Alcohol Use: Yes    OB History    Grav Para Term Preterm Abortions TAB SAB Ect Mult Living                  Review of Systems  Constitutional: Negative for fever.  Respiratory: Negative  for shortness of breath.   Cardiovascular: Negative for chest pain.  Gastrointestinal: Negative for abdominal pain.  Musculoskeletal: Positive for arthralgias. Negative for myalgias and back pain.  Skin: Negative for rash.  Neurological: Negative for weakness and numbness.  All other systems reviewed and are negative.    Allergies  Azithromycin; Oxycodone-acetaminophen; and Penicillins  Home Medications   Current Outpatient Rx  Name Route Sig Dispense Refill  . ACETAMINOPHEN ER 650 MG PO TBCR  1-2 tablets by mouth 3 times a day as needed     . ALPRAZOLAM 0.5 MG PO TABS Oral Take 1 tablet (0.5 mg total) by mouth 3 (three) times daily as needed for sleep or anxiety. 90 tablet 0  . ATORVASTATIN CALCIUM 10 MG PO TABS Oral Take 1 tablet (10 mg total) by mouth daily. 90 tablet 3  . BD PEN NEEDLE MINI U/F 31G X 5 MM MISC  USE FOUR TIMES DAILY AS NEEDED PER SLIDING SCALE 200 each 11  . BENZONATATE 100 MG PO CAPS  1-2 tabs by mouth up to three times per day as needed for cough 90 capsule 1  . CELEBREX 200 MG PO CAPS  Once a day as needed    . CETIRIZINE HCL 10 MG PO TABS Oral Take 10 mg by mouth daily.      . CYCLOBENZAPRINE HCL 5 MG PO TABS Oral Take 5 mg by mouth as needed.      Marland Kitchen FLUTICASONE PROPIONATE 50 MCG/ACT NA SUSP Nasal 2 sprays by Nasal route daily. 16 g 2  . FUROSEMIDE 20 MG PO TABS Oral Take 20 mg by mouth daily as needed.      Marland Kitchen LEVALBUTEROL TARTRATE 45 MCG/ACT IN AERO Inhalation Inhale 1-2 puffs into the lungs every 4 (four) hours as needed for wheezing. 1 Inhaler 12  . METHOTREXATE 2.5 MG PO TABS  TAKE 4 TABLETS BY MOUTH ONCE WEEKLY 18 tablet 0  . METHOTREXATE 2.5 MG PO TABS  TAKE 4 TABLETS BY MOUTH ONCE WEEKLY 16 tablet 3  . METHOTREXATE 2.5 MG PO TABS  TAKE 4 TABLETS BY MOUTH ONCE A WEEK 16 tablet 3  . NOVOLOG FLEXPEN 100 UNIT/ML [place] SOLN  USE AS DIRECTED PER SLIDING SCALE 15 mL 11  . OMEPRAZOLE 20 MG PO CPDR  TAKE 2 CAPSULES BY MOUTH ONCE DAILY 60 capsule 11  . ONETOUCH  ULTRA BLUE VI STRP  USE TO TEST FOUR TIMES DAILY 200 each 11  . ONETOUCH DELICA LANCETS MISC  USE TO TEST FOUR TIMES DAILY 200 each 11  . PREDNISONE 10 MG PO TABS  One a day    . PREDNISONE 5 MG PO TABS  TAKE AS DIRECTED 100 tablet 0  . SERTRALINE HCL 50 MG PO TABS  TAKE 1 TABLET BY MOUTH DAILY 30 tablet 2  . SITAGLIPTIN-METFORMIN HCL 50-500 MG PO TABS Oral Take 1 tablet by mouth daily.      . SUMATRIPTAN SUCCINATE 100 MG PO TABS Oral Take 1 tablet (100 mg total) by mouth every 2 (two) hours as needed for migraine. 10 tablet 5  . SYMBICORT 160-4.5 MCG/ACT IN AERO  INHALE 2 PUFFS BY MOUTH TWICE DAILY 10.2 g 6  . TRAMADOL HCL 50 MG PO TABS Oral Take 50 mg by mouth as needed. Maximum dose= 8 tablets per day     . ZOLPIDEM TARTRATE ER 12.5 MG PO TBCR  TAKE 1 TABLET BY MOUTH AT BEDTIME AS NEEDED 30 tablet 5    BP 107/62  Pulse 104  Temp(Src) 98 F (36.7 C) (Oral)  Resp 16  Wt 330 lb (149.687 kg)  SpO2 99%  LMP 01/27/2012  Physical Exam  Nursing note and vitals reviewed. Constitutional: She is oriented to person, place, and time. She appears well-developed and well-nourished.  HENT:  Head: Normocephalic and atraumatic.  Neck: Neck supple.  Pulmonary/Chest: Effort normal.  Musculoskeletal:       Right elbow: no tenderness found.       Left elbow: no tenderness found.       Right wrist: She exhibits tenderness.       Left wrist: She exhibits tenderness.       Right knee: tenderness found.       Left knee: no tenderness found.       Right ankle: no tenderness.       Left ankle: tenderness.       Patient with diffuse tenderness of bilateral wrists, right knee, left ankle.  No localized tenderness. Sensation intact, pulses intact.  No edema, erythema, warmth.    Neurological: She is alert and oriented to person, place, and time.    ED Course  Procedures (including critical care  time)  Labs Reviewed - No data to display No results found.  Discussed patient with Dr Patria Mane.   Filed  Vitals:   02/10/12 0441  BP: 143/70  Pulse: 77  Temp:   Resp: 18     1. Sarcoidosis   2. Joint pain       MDM  Patient with chronic joint pain from sarcoidosis, with increased pain today without injury or fevers.  Pt with tender joints without erythema, edema, fluctuance, or warmth.  Distal pulses intact.  Pain relieved in ED.  Likely normal pain exacerbation in patient's chronic condition.  D/C home with short course of pain medication and follow up with doctor who manages her sarcoidosis.  To return for worsening symptoms.  Return precautions given. Patient verbalizes understanding and agrees with plan.          Dillard Cannon [place], [place] 02/10/12 [phone]

## 2012-02-10 NOTE — Discharge Instructions (Signed)
Please call your doctor tomorrow morning to schedule a close follow up appointment for further management of your pain.  If you develop fevers, uncontrolled pain, or extreme swelling or redness of your joints, return to the ER immediately for a recheck.  You may return to the ER at any time for worsening condition or any new symptoms that concern you.   Arthralgia Your caregiver has diagnosed you as suffering from an arthralgia. Arthralgia means there is pain in a joint. This can come from many reasons including:  Bruising the joint which causes soreness (inflammation) in the joint.   Wear and tear on the joints which occur as we grow older (osteoarthritis).   Overusing the joint.   Various forms of arthritis.   Infections of the joint.  Regardless of the cause of pain in your joint, most of these different pains respond to anti-inflammatory drugs and rest. The exception to this is when a joint is infected, and these cases are treated with antibiotics, if it is a bacterial infection. HOME CARE INSTRUCTIONS   Rest the injured area for as long as directed by your caregiver. Then slowly start using the joint as directed by your caregiver and as the pain allows. Crutches as directed may be useful if the ankles, knees or hips are involved. If the knee was splinted or casted, continue use and care as directed. If an stretchy or elastic wrapping bandage has been applied today, it should be removed and re-applied every 3 to 4 hours. It should not be applied tightly, but firmly enough to keep swelling down. Watch toes and feet for swelling, bluish discoloration, coldness, numbness or excessive pain. If any of these problems (symptoms) occur, remove the ace bandage and re-apply more loosely. If these symptoms persist, contact your caregiver or return to this location.   For the first 24 hours, keep the injured extremity elevated on pillows while lying down.   Apply ice for 15 to 20 minutes to the sore  joint every couple hours while awake for the first half day. Then 3 to 4 times per day for the first 48 hours. Put the ice in a plastic bag and place a towel between the bag of ice and your skin.   Wear any splinting, casting, elastic bandage applications, or slings as instructed.   Only take over-the-counter or prescription medicines for pain, discomfort, or fever as directed by your caregiver. Do not use aspirin immediately after the injury unless instructed by your physician. Aspirin can cause increased bleeding and bruising of the tissues.   If you were given crutches, continue to use them as instructed and do not resume weight bearing on the sore joint until instructed.  Persistent pain and inability to use the sore joint as directed for more than 2 to 3 days are warning signs indicating that you should see a caregiver for a follow-up visit as soon as possible. Initially, a hairline fracture (break in bone) may not be evident on X-rays. Persistent pain and swelling indicate that further evaluation, non-weight bearing or use of the joint (use of crutches or slings as instructed), or further X-rays are indicated. X-rays may sometimes not show a small fracture until a week or 10 days later. Make a follow-up appointment with your own caregiver or one to whom we have referred you. A radiologist (specialist in reading X-rays) may read your X-rays. Make sure you know how you are to obtain your X-ray results. Do not assume everything is normal if  you do not hear from [place]. SEEK MEDICAL CARE IF: Bruising, swelling, or pain increases. SEEK IMMEDIATE MEDICAL CARE IF:   Your fingers or toes are numb or blue.   The pain is not responding to medications and continues to stay the same or get worse.   The pain in your joint becomes severe.   You develop a fever over 102 F (38.9 C).   It becomes impossible to move or use the joint.  MAKE SURE YOU:   Understand these instructions.   Will watch your  condition.   Will get help right away if you are not doing well or get worse.  Document Released: 10/11/2005 Document Revised: 09/30/2011 Document Reviewed: 05/29/2008 [facility] Patient Information 2012 [place], [place].

## 2012-02-11 ENCOUNTER — Other Ambulatory Visit (INDEPENDENT_AMBULATORY_CARE_PROVIDER_SITE_OTHER): Payer: 59

## 2012-02-11 ENCOUNTER — Ambulatory Visit (INDEPENDENT_AMBULATORY_CARE_PROVIDER_SITE_OTHER): Payer: 59 | Admitting: Internal Medicine

## 2012-02-11 ENCOUNTER — Encounter: Payer: Self-pay | Admitting: Internal Medicine

## 2012-02-11 VITALS — BP 120/82 | HR 92 | Temp 98.9°F | Ht 67.0 in | Wt 343.0 lb

## 2012-02-11 DIAGNOSIS — E119 Type 2 diabetes mellitus without complications: Secondary | ICD-10-CM

## 2012-02-11 DIAGNOSIS — D869 Sarcoidosis, unspecified: Secondary | ICD-10-CM

## 2012-02-11 DIAGNOSIS — D509 Iron deficiency anemia, unspecified: Secondary | ICD-10-CM

## 2012-02-11 DIAGNOSIS — M130 Polyarthritis, unspecified: Secondary | ICD-10-CM | POA: Insufficient documentation

## 2012-02-11 LAB — CBC WITH DIFFERENTIAL/PLATELET
Basophils Relative: 0.1 % (ref 0.0–3.0)
Eosinophils Absolute: 0.1 10*3/uL (ref 0.0–0.7)
Eosinophils Relative: 1.3 % (ref 0.0–5.0)
Eosinophils Relative: 1.7 % (ref 0.0–5.0)
HCT: 29.9 % — ABNORMAL LOW (ref 36.0–46.0)
HCT: 30.1 % — ABNORMAL LOW (ref 36.0–46.0)
Lymphs Abs: 0.4 10*3/uL — ABNORMAL LOW (ref 0.7–4.0)
Lymphs Abs: 0.8 10*3/uL (ref 0.7–4.0)
MCHC: 30.2 g/dL (ref 30.0–36.0)
MCV: 64.9 fl — ABNORMAL LOW (ref 78.0–100.0)
MCV: 65.1 fl — ABNORMAL LOW (ref 78.0–100.0)
Monocytes Absolute: 0.5 10*3/uL (ref 0.1–1.0)
Monocytes Absolute: 0.6 10*3/uL (ref 0.1–1.0)
Neutrophils Relative %: 87.7 % — ABNORMAL HIGH (ref 43.0–77.0)
Platelets: 308 10*3/uL (ref 150.0–400.0)
Platelets: 314 10*3/uL (ref 150.0–400.0)
RDW: 21.3 % — ABNORMAL HIGH (ref 11.5–14.6)
WBC: 8.7 10*3/uL (ref 4.5–10.5)
WBC: 8.9 10*3/uL (ref 4.5–10.5)

## 2012-02-11 LAB — BASIC METABOLIC PANEL
GFR: 135.94 mL/min (ref >60.00)
Glucose, Bld: 104 mg/dL — ABNORMAL HIGH (ref 70–99)
Potassium: 4.2 mEq/L (ref 3.5–5.1)
Sodium: 137 mEq/L (ref 135–145)

## 2012-02-11 LAB — HEPATIC FUNCTION PANEL
ALT: 18 U/L (ref 0–35)
ALT: 19 U/L (ref 0–35)
AST: 23 U/L (ref 0–37)
Albumin: 3.8 g/dL (ref 3.5–5.2)
Bilirubin, Direct: 0 mg/dL (ref 0.0–0.3)
Total Bilirubin: 0.3 mg/dL (ref 0.3–1.2)
Total Bilirubin: 0.3 mg/dL (ref 0.3–1.2)

## 2012-02-11 LAB — LIPID PANEL
LDL Cholesterol: 99 mg/dL (ref 0–99)
Total CHOL/HDL Ratio: 3
VLDL: 18.4 mg/dL (ref 0.0–40.0)

## 2012-02-11 LAB — HEMOGLOBIN A1C: Hgb A1c MFr Bld: 6 % (ref 4.6–6.5)

## 2012-02-11 LAB — IBC PANEL: Iron: 14 ug/dL — ABNORMAL LOW (ref 42–145)

## 2012-02-11 LAB — SEDIMENTATION RATE: Sed Rate: 40 mm/hr — ABNORMAL HIGH (ref 0–22)

## 2012-02-11 MED ORDER — PREDNISONE 10 MG PO TABS: 10.0000 mg | ORAL_TABLET | Freq: Every day | ORAL | Status: DC

## 2012-02-11 MED ORDER — HYDROMORPHONE HCL 2 MG PO TABS: ORAL_TABLET | ORAL | Status: DC

## 2012-02-11 MED ORDER — METHYLPREDNISOLONE ACETATE 80 MG/ML IJ SUSP: 120.0000 mg | Freq: Once | INTRAMUSCULAR | Status: DC

## 2012-02-11 MED ORDER — METHYLPREDNISOLONE ACETATE 80 MG/ML IJ SUSP
120.0000 mg | Freq: Once | INTRAMUSCULAR | Status: AC
  Administered 2012-02-11: 120 mg via INTRAMUSCULAR

## 2012-02-11 NOTE — Assessment & Plan Note (Addendum)
Pain has been attributed to sarcoid, but with frank swelling as well now appears to be inflammatory and I would also consider gout  - for depomedrol IM, predpack then return to baseline 5 qd, check uric acid, consider allopurinol, consider f/u with Dr Dierdre Forth for future attack for crystal/fluid analysis, and pain control today

## 2012-02-11 NOTE — Patient Instructions (Signed)
You had the steroid shot today Take all new medications as prescribed Continue all other medications as before Please have the pharmacy call with any refills you may need. Please go to LAB in the Basement for the blood and/or urine tests to be done today You will be contacted by phone if any changes need to be made immediately.  Otherwise, you will receive a letter about your results with an explanation. Please plan to return to work as scheduled 12 am Apr 23  Please consider seeing Dr Christy Gentles if you have an attack in the future, as I think he may be able to check joint fluid analysis for crystals

## 2012-02-12 ENCOUNTER — Encounter: Payer: Self-pay | Admitting: Internal Medicine

## 2012-02-12 LAB — RHEUMATOID FACTOR: Rhuematoid fact SerPl-aCnc: <10 IU/mL (ref <=14)

## 2012-02-12 NOTE — Assessment & Plan Note (Signed)
stable overall by hx and exam, most recent data reviewed with pt, and pt to continue medical treatment as before, consider iron infusion  Lab Results  Component Value Date   WBC 8.7 02/11/2012   HGB 9.1* 02/11/2012   HCT 30.1* 02/11/2012   MCV 64.9* 02/11/2012   PLT 314.0 02/11/2012   Lab Results  Component Value Date   IRON 14* 02/11/2012   FERRITIN 4.3* 02/11/2012

## 2012-02-12 NOTE — Progress Notes (Signed)
Subjective:    Patient ID: Nicole Melendez, female    DOB: [birth year], 36 y.o.   MRN: [id number]  HPI   Here to f/u, seen in ER apr 18 with joint pain thought related to sarcoid, today states pain increased now with swelling as well to bilat wrists and left ankle, without trauma, fever, hx or gout, and pt with good compliance with celebrex and daily prednisone 5 qd.  Also with pain to right knee, but no swelling.  Tx with limited dilaudid 2 mg rx but pt finds hard to take as the whole 2 mg makes her loopy, cant drive.  Father with hx of gout, mother with OA.  Sees [facility] for sardoid, hard to lose wt on the prednisone.  Pt denies chest pain, increased sob or doe, wheezing, orthopnea, PND, increased LE swelling, palpitations, dizziness or syncope.  Pt denies new neurological symptoms such as new headache, or facial or extremity weakness or numbness   Pt denies polydipsia, polyuria on the prednisone, last a1c approx 7 mo ago normal.  No overt bleeding or bruising, still with occas irreg period.   Past Medical History  Diagnosis Date  . Morbid obesity   . Peptic ulcer disease   . GERD (gastroesophageal reflux disease)   . Migraine   . Anxiety   . Anemia   . Hyperlipidemia   . Menorrhagia   . PNA (pneumonia) july 2011  . Diabetes mellitus type II     steroid related  . Depression   . Sarcoid     including hand per rheumatology-Dr. Dierdre Forth  . Allergic rhinitis, cause unspecified   . Varicose veins with pain    Past Surgical History  Procedure Date  . Uterine ablation 03/2010    reports that she quit smoking about 21 months ago. Her smoking use included Cigarettes. She has a 20 pack-year smoking history. She does not have any smokeless tobacco history on file. She reports that she drinks alcohol. She reports that she does not use illicit drugs. family history includes Allergies in her mother; Bone cancer in her maternal aunt; Breast cancer in her maternal aunt; Diabetes in an unspecified  family member; Heart attack in her mother; Heart disease in her father; Hypertension in an unspecified family member; Lung cancer in her maternal aunt; Ovarian cancer in her maternal aunt; and Rheum arthritis in her father. Allergies  Allergen Reactions  . Azithromycin     (z pak) hives  . Oxycodone-Acetaminophen     REACTION: difficulty breathing  . Penicillins     REACTION: swelling and difficulty breathing   Current Outpatient Prescriptions on File Prior to Visit  Medication Sig Dispense Refill  . acetaminophen (TYLENOL) 650 MG CR tablet 1-2 tablets by mouth 3 times a day as needed       . ALPRAZolam (XANAX) 0.5 MG tablet Take 1 tablet (0.5 mg total) by mouth 3 (three) times daily as needed for sleep or anxiety.  90 tablet  0  . atorvastatin (LIPITOR) 10 MG tablet Take 1 tablet (10 mg total) by mouth daily.  90 tablet  3  . B-D UF III MINI PEN NEEDLES 31G X 5 MM MISC USE FOUR TIMES DAILY AS NEEDED PER SLIDING SCALE  200 each  11  . benzonatate (TESSALON PERLES) 100 MG capsule 1-2 tabs by mouth up to three times per day as needed for cough  90 capsule  1  . CELEBREX 200 MG capsule Take 200 mg by mouth daily. Once  a day as needed      . cetirizine (ZYRTEC) 10 MG tablet Take 10 mg by mouth daily.        . cyclobenzaprine (FLEXERIL) 5 MG tablet Take 5 mg by mouth as needed.        . fluticasone (FLONASE) 50 MCG/ACT nasal spray 2 sprays by Nasal route daily.  16 g  2  . furosemide (LASIX) 20 MG tablet Take 20 mg by mouth daily as needed.        . levalbuterol (XOPENEX HFA) 45 MCG/ACT inhaler Inhale 1-2 puffs into the lungs every 4 (four) hours as needed for wheezing.  1 Inhaler  12  . methotrexate (RHEUMATREX) 2.5 MG tablet TAKE 4 TABLETS BY MOUTH ONCE A WEEK  16 tablet  3  . NOVOLOG FLEXPEN 100 UNIT/ML injection USE AS DIRECTED PER SLIDING SCALE  15 mL  11  . omeprazole (PRILOSEC) 20 MG capsule TAKE 2 CAPSULES BY MOUTH ONCE DAILY  60 capsule  11  . ONE TOUCH ULTRA TEST test strip USE TO TEST  FOUR TIMES DAILY  200 each  11  . ONETOUCH DELICA LANCETS MISC USE TO TEST FOUR TIMES DAILY  200 each  11  . predniSONE (DELTASONE) 5 MG tablet TAKE AS DIRECTED  100 tablet  0  . sertraline (ZOLOFT) 50 MG tablet TAKE 1 TABLET BY MOUTH DAILY  30 tablet  2  . sitaGLIPtan-metformin (JANUMET) 50-500 MG per tablet Take 1 tablet by mouth daily.        . SUMAtriptan (IMITREX) 100 MG tablet Take 1 tablet (100 mg total) by mouth every 2 (two) hours as needed for migraine.  10 tablet  5  . SYMBICORT 160-4.5 MCG/ACT inhaler INHALE 2 PUFFS BY MOUTH TWICE DAILY  10.2 g  6  . traMADol (ULTRAM) 50 MG tablet Take 50 mg by mouth as needed. Maximum dose= 8 tablets per day       . zolpidem (AMBIEN CR) 12.5 MG CR tablet TAKE 1 TABLET BY MOUTH AT BEDTIME AS NEEDED  30 tablet  5   Review of Systems Review of Systems  Constitutional: Negative for diaphoresis and unexpected weight change.  Respiratory: Negative for choking and stridor.   Gastrointestinal: Negative for vomiting and blood in stool.  Genitourinary: Negative for hematuria and decreased urine volume.  Skin: Negative for color change and wound.  Neurological: Negative for tremors and numbness.  Psychiatric/Behavioral: Negative for decreased concentration. The patient is not hyperactive.       Objective:   Physical Exam BP 120/82  Pulse 92  Temp(Src) 98.9 F (37.2 C) (Oral)  Ht 5\' 7"  (1.702 m)  Wt 343 lb (155.584 kg)  BMI 53.72 kg/m2  SpO2 95%  LMP 01/27/2012 Physical Exam  VS noted Constitutional: Pt appears well-developed and well-nourished.  HENT: Head: Normocephalic.  Right Ear: External ear normal.  Left Ear: External ear normal.  Eyes: Conjunctivae and EOM are normal. Pupils are equal, round, and reactive to light.  Neck: Normal range of motion. Neck supple.  Cardiovascular: Normal rate and regular rhythm.   Pulmonary/Chest: Effort normal and breath sounds normal.  Abd:  Soft, NT, non-distended, + BS Neurological: Pt is alert. No  motor intact; gait antalgic to wt bearing on left ankle MSK:   has mild trace to 1+ effusion bilat wrists and left ankle, with tenderness but FROM each.  Right knee NT, no effusion and FROM Skin: Skin is warm. No erythema.  Psychiatric: Pt behavior is normal. Thought content  normal. 1+ nervous    Assessment & Plan:

## 2012-02-12 NOTE — Assessment & Plan Note (Signed)
stable overall by hx and exam, most recent data reviewed with pt, and pt to continue medical treatment as before  Lab Results  Component Value Date   HGBA1C 6.0 02/11/2012    

## 2012-02-14 ENCOUNTER — Telehealth: Payer: Self-pay | Admitting: Internal Medicine

## 2012-02-14 ENCOUNTER — Encounter: Payer: Self-pay | Admitting: Internal Medicine

## 2012-02-14 ENCOUNTER — Ambulatory Visit (HOSPITAL_COMMUNITY): Payer: 59

## 2012-02-14 DIAGNOSIS — R768 Other specified abnormal immunological findings in serum: Secondary | ICD-10-CM

## 2012-02-14 DIAGNOSIS — M130 Polyarthritis, unspecified: Secondary | ICD-10-CM

## 2012-02-14 HISTORY — DX: Other specified abnormal immunological findings in serum: R76.8

## 2012-02-14 LAB — ANTI-NUCLEAR AB-TITER (ANA TITER): ANA Titer 1: NEGATIVE

## 2012-02-14 MED ORDER — FERUMOXYTOL INJECTION 510 MG/17 ML: INTRAVENOUS | Status: DC

## 2012-02-14 NOTE — Telephone Encounter (Signed)
Iv iron Done hardcopy to [facility]  Referral done

## 2012-02-14 NOTE — Telephone Encounter (Signed)
Scheduled the patient for IV Iron at [facility] 02/22/12 at 9 AM and 02/29/12 at 9 AM. Faxed completed form and medication list to [facility] at [phone]. Called the patient informed of appointments and patient agreed to all.

## 2012-02-15 ENCOUNTER — Ambulatory Visit (INDEPENDENT_AMBULATORY_CARE_PROVIDER_SITE_OTHER): Payer: 59 | Admitting: Internal Medicine

## 2012-02-15 ENCOUNTER — Encounter: Payer: Self-pay | Admitting: Internal Medicine

## 2012-02-15 VITALS — BP 122/62 | HR 91 | Temp 97.6°F | Ht 67.0 in | Wt 344.5 lb

## 2012-02-15 DIAGNOSIS — E119 Type 2 diabetes mellitus without complications: Secondary | ICD-10-CM

## 2012-02-15 DIAGNOSIS — D509 Iron deficiency anemia, unspecified: Secondary | ICD-10-CM

## 2012-02-15 DIAGNOSIS — R894 Abnormal immunological findings in specimens from other organs, systems and tissues: Secondary | ICD-10-CM

## 2012-02-15 DIAGNOSIS — M130 Polyarthritis, unspecified: Secondary | ICD-10-CM

## 2012-02-15 DIAGNOSIS — R768 Other specified abnormal immunological findings in serum: Secondary | ICD-10-CM

## 2012-02-15 NOTE — Assessment & Plan Note (Signed)
somwhat suggestive of rheumatologic d/o such as lupus, and does have anemia, as well as joint issuesm but may be nonspecific, for rheum f/u as above

## 2012-02-15 NOTE — Patient Instructions (Addendum)
Continue all other medications as before Please keep your appointments with your specialists as you have planned - Dr Dierdre Forth, and Dr Jessica Priest are given the work note Please keep your appointments with your IV iron as planned

## 2012-02-15 NOTE — Assessment & Plan Note (Signed)
Moderate, for IV iron  - apr 30 and second dose 1 wk later, cont oral iron

## 2012-02-15 NOTE — Assessment & Plan Note (Signed)
Improved , to cont same tx, finish current predpack, but unable to wear the boots for work, ok for work note, recent esr mild to [employer], neg RF but pos ANA somewhat suggestive of other illness such as lupus but could be nonspecific, for Rheum f/u as well, sees Dr Dierdre Forth

## 2012-02-15 NOTE — Assessment & Plan Note (Signed)
stable overall by hx and exam, most recent data reviewed with pt, and pt to continue medical treatment as before  Lab Results  Component Value Date   HGBA1C 6.0 02/11/2012

## 2012-02-15 NOTE — Progress Notes (Signed)
Subjective:    Patient ID: Nicole Melendez, female    DOB: 1976/03/15, 36 y.o.   MRN: [id number]  HPI  Here to f/u; was supposed to go to work last night, but unable due to joint tenderness and pain, specifically the left ankle;  Overall swelling is down at least 50% and pain down about 25% but is required to wear steel toed boots with a metal protection to the ankle as well, and pt simply too much tender and pain to wear the boot last night despite current treatment.  Has had no new trauma, fever or new or other joint pain or swelling.  No new cough. Sob, wheezing, CP.  CBG's minimally increased on po prednisone, and  Pt denies polydipsia, polyuria.   Past Medical History  Diagnosis Date  . Morbid obesity   . Peptic ulcer disease   . GERD (gastroesophageal reflux disease)   . Migraine   . Anxiety   . Anemia   . Hyperlipidemia   . Menorrhagia   . PNA (pneumonia) july 2011  . Diabetes mellitus type II     steroid related  . Depression   . Sarcoid     including hand per rheumatology-Dr. Dierdre Forth  . Allergic rhinitis, cause unspecified   . Varicose veins with pain   . Positive ANA (antinuclear antibody) 02/14/2012   Past Surgical History  Procedure Date  . Uterine ablation 03/2010    reports that she quit smoking about 21 months ago. Her smoking use included Cigarettes. She has a 20 pack-year smoking history. She does not have any smokeless tobacco history on file. She reports that she drinks alcohol. She reports that she does not use illicit drugs. family history includes Allergies in her mother; Bone cancer in her maternal aunt; Breast cancer in her maternal aunt; Diabetes in an unspecified family member; Heart attack in her mother; Heart disease in her father; Hypertension in an unspecified family member; Lung cancer in her maternal aunt; Ovarian cancer in her maternal aunt; and Rheum arthritis in her father. Allergies  Allergen Reactions  . Azithromycin     (z pak) hives  .  Oxycodone-Acetaminophen     REACTION: difficulty breathing  . Penicillins     REACTION: swelling and difficulty breathing   Current Outpatient Prescriptions on File Prior to Visit  Medication Sig Dispense Refill  . acetaminophen (TYLENOL) 650 MG CR tablet 1-2 tablets by mouth 3 times a day as needed       . ALPRAZolam (XANAX) 0.5 MG tablet Take 1 tablet (0.5 mg total) by mouth 3 (three) times daily as needed for sleep or anxiety.  90 tablet  0  . atorvastatin (LIPITOR) 10 MG tablet Take 1 tablet (10 mg total) by mouth daily.  90 tablet  3  . B-D UF III MINI PEN NEEDLES 31G X 5 MM MISC USE FOUR TIMES DAILY AS NEEDED PER SLIDING SCALE  200 each  11  . benzonatate (TESSALON PERLES) 100 MG capsule 1-2 tabs by mouth up to three times per day as needed for cough  90 capsule  1  . CELEBREX 200 MG capsule Take 200 mg by mouth daily. Once a day as needed      . cetirizine (ZYRTEC) 10 MG tablet Take 10 mg by mouth daily.        . cyclobenzaprine (FLEXERIL) 5 MG tablet Take 5 mg by mouth as needed.        . ferumoxytol (FERAHEME) 510 MG/17ML SOLN 510 mg  IV x 1 , with repeat dose in 1 week  15.08 mL  1  . fluticasone (FLONASE) 50 MCG/ACT nasal spray 2 sprays by Nasal route daily.  16 g  2  . furosemide (LASIX) 20 MG tablet Take 20 mg by mouth daily as needed.        Marland Kitchen HYDROmorphone (DILAUDID) 2 MG tablet 1/2 - 1 po bid as needed for pain  15 tablet  0  . levalbuterol (XOPENEX HFA) 45 MCG/ACT inhaler Inhale 1-2 puffs into the lungs every 4 (four) hours as needed for wheezing.  1 Inhaler  12  . methotrexate (RHEUMATREX) 2.5 MG tablet TAKE 4 TABLETS BY MOUTH ONCE A WEEK  16 tablet  3  . NOVOLOG FLEXPEN 100 UNIT/ML injection USE AS DIRECTED PER SLIDING SCALE  15 mL  11  . omeprazole (PRILOSEC) 20 MG capsule TAKE 2 CAPSULES BY MOUTH ONCE DAILY  60 capsule  11  . ONE TOUCH ULTRA TEST test strip USE TO TEST FOUR TIMES DAILY  200 each  11  . ONETOUCH DELICA LANCETS MISC USE TO TEST FOUR TIMES DAILY  200 each  11   . predniSONE (DELTASONE) 10 MG tablet Take 1 tablet (10 mg total) by mouth daily. 3 tabs by mouth per day for 3 days, then 2 tabs per day for 3 days, then 1 tab per day for 3 days, then stop  18 tablet  0  . predniSONE (DELTASONE) 5 MG tablet TAKE AS DIRECTED  100 tablet  0  . sertraline (ZOLOFT) 50 MG tablet TAKE 1 TABLET BY MOUTH DAILY  30 tablet  2  . sitaGLIPtan-metformin (JANUMET) 50-500 MG per tablet Take 1 tablet by mouth daily.        . SUMAtriptan (IMITREX) 100 MG tablet Take 1 tablet (100 mg total) by mouth every 2 (two) hours as needed for migraine.  10 tablet  5  . SYMBICORT 160-4.5 MCG/ACT inhaler INHALE 2 PUFFS BY MOUTH TWICE DAILY  10.2 g  6  . traMADol (ULTRAM) 50 MG tablet Take 50 mg by mouth as needed. Maximum dose= 8 tablets per day       . zolpidem (AMBIEN CR) 12.5 MG CR tablet TAKE 1 TABLET BY MOUTH AT BEDTIME AS NEEDED  30 tablet  5   Review of Systems Review of Systems  Constitutional: Negative for diaphoresis and unexpected weight change.  Eyes: Negative for photophobia and visual disturbance.  Respiratory: Negative for choking and stridor.   Gastrointestinal: Negative for vomiting and blood in stool.  Genitourinary: Negative for hematuria and decreased urine volume.  Musculoskeletal: + for limping to walk, but less than last visit Skin: Negative for color change and wound.  Neurological: Negative for tremors and numbness.     Objective:   Physical Exam BP 122/62  Pulse 91  Temp(Src) 97.6 F (36.4 C) (Oral)  Ht 5\' 7"  (1.702 m)  Wt 344 lb 8 oz (156.264 kg)  BMI 53.96 kg/m2  SpO2 98%  LMP 01/27/2012 Physical Exam  VS noted Constitutional: Pt appears well-developed and well-nourished.  HENT: Head: Normocephalic.  Right Ear: External ear normal.  Left Ear: External ear normal.  Eyes: Conjunctivae and EOM are normal. Pupils are equal, round, and reactive to light.  Neck: Normal range of motion. Neck supple.  Cardiovascular: Normal rate and regular rhythm.     Pulmonary/Chest: Effort normal and breath sounds normal.  Neurological: Pt is alert. Motor/sens/dtr intact throughout Skin: Skin is warm. No erythema. No rash Bilat wrist  with trace warmth, swelling, tender with FROM , gait antalgic - favors LLE Left ankle with trace warm, swelling but still at least mod tender, worse to ROM but did have full ROM Psychiatric: Pt behavior is normal. Thought content normal. 1+ nervous    Assessment & Plan:

## 2012-02-17 ENCOUNTER — Encounter: Payer: Self-pay | Admitting: Pulmonary Disease

## 2012-02-17 ENCOUNTER — Ambulatory Visit (INDEPENDENT_AMBULATORY_CARE_PROVIDER_SITE_OTHER): Payer: 59 | Admitting: Pulmonary Disease

## 2012-02-17 VITALS — BP 110/68 | HR 96 | Temp 98.5°F | Ht 67.0 in | Wt 344.8 lb

## 2012-02-17 DIAGNOSIS — D869 Sarcoidosis, unspecified: Secondary | ICD-10-CM

## 2012-02-17 NOTE — Patient Instructions (Signed)
Stay on symbicort, methotrexate with folic acid, and prednisone per rheumatology. Work on weight loss Can try chlorpheniramine 4mg  or 8mg  twice a day as needed for allergies in the place of zyrtec  Will schedule for your yearly breathing studies in 4-6 weeks after allergy season. followup with me in 6mos if doing well, but keep up with monthly bloodwork.

## 2012-02-17 NOTE — Assessment & Plan Note (Signed)
The patient been doing fairly well from a pulmonary standpoint, but has been having a lot of joint issues.  She is following closely with her rheumatologist for this.  I've asked her to continue on her current inhaler regimen, and also encouraged her to work aggressively on weight loss.  She is due for her yearly pulmonary function studies, and I would rather wait until allergy season is a little further along.  If she is doing well, I will see her back in 6 months.

## 2012-02-17 NOTE — Progress Notes (Signed)
  Subjective:    Patient ID: Nicole Melendez, female    DOB: 02-Mar-1976, 36 y.o.   MRN: [id number]  HPI Patient comes in today for followup of her known sarcoidosis, manifested as dyspnea on exertion and also significant joint complaints.  She is currently on methotrexate and low-dose prednisone, but most recently has had issues with joint pain and swelling.  She has had her prednisone increased by her rheumatologist, and consideration is being given to increasing her methotrexate dose.  From a pulmonary standpoint, the patient feels that she is fairly stable.  She has had no significant increasing shortness of breath despite gaining 13 pounds since last visit.  She has minimal cough except for that being caused by postnasal drip related to allergies.  She has been keeping up with her monthly blood work, and it has been normal.   Review of Systems  Constitutional: Negative for fever and unexpected weight change.  HENT: Positive for rhinorrhea. Negative for ear pain, nosebleeds, congestion, sore throat, sneezing, trouble swallowing, dental problem, postnasal drip and sinus pressure.   Eyes: Negative for redness and itching.  Respiratory: Positive for cough. Negative for chest tightness, shortness of breath and wheezing.   Cardiovascular: Positive for leg swelling. Negative for palpitations.  Gastrointestinal: Negative for nausea and vomiting.  Genitourinary: Negative for dysuria.  Musculoskeletal: Negative for joint swelling.  Skin: Negative for rash.  Neurological: Negative for headaches.  Hematological: Does not bruise/bleed easily.  Psychiatric/Behavioral: Negative for dysphoric mood. The patient is not nervous/anxious.        Objective:   Physical Exam Obese female in no acute distress Nose without purulence or discharge noted Chest with mildly decreased breath sounds, a few scattered crackles, no wheezing Cardiac exam with regular rate and rhythm Lower extremities with no significant  edema, no cyanosis Alert and oriented, moves all 4 extremities.       Assessment & Plan:

## 2012-02-22 ENCOUNTER — Encounter (HOSPITAL_COMMUNITY): Admission: RE | Admit: 2012-02-22 | Payer: 59 | Source: Ambulatory Visit

## 2012-02-29 ENCOUNTER — Encounter (HOSPITAL_COMMUNITY)
Admission: RE | Admit: 2012-02-29 | Discharge: 2012-02-29 | Disposition: A | Discharge status: Home / Self Care | Payer: 59 | Source: Ambulatory Visit | Attending: Internal Medicine | Admitting: Internal Medicine

## 2012-02-29 ENCOUNTER — Encounter (HOSPITAL_COMMUNITY): Payer: Self-pay

## 2012-02-29 DIAGNOSIS — D509 Iron deficiency anemia, unspecified: Secondary | ICD-10-CM | POA: Insufficient documentation

## 2012-02-29 MED ORDER — FERUMOXYTOL INJECTION 510 MG/17 ML
510.0000 mg | INTRAVENOUS | Status: DC
  Administered 2012-02-29: 510 mg via INTRAVENOUS

## 2012-02-29 MED ORDER — SODIUM CHLORIDE 0.9 % IV SOLN
INTRAVENOUS | Status: DC
  Administered 2012-02-29: 09:00:00 via INTRAVENOUS

## 2012-02-29 MED ORDER — FERUMOXYTOL INJECTION 510 MG/17 ML
INTRAVENOUS | Status: AC
  Administered 2012-02-29: 510 mg via INTRAVENOUS
  Filled 2012-02-29: qty 17

## 2012-02-29 NOTE — Discharge Instructions (Signed)
Refer to printed sheet for next appointment. Short Stay Phone # [phone] Your 2nd appointment for Nicole Melendez is 5/13 13 at 9:30 AM    [facility]  What is this medicine? FERUMOXYTOL is an iron complex. Iron is used to make healthy red blood cells, which carry oxygen and nutrients throughout the body. This medicine is used to treat iron deficiency anemia in people with chronic kidney disease. This medicine may be used for other purposes; ask your health care provider or pharmacist if you have questions. What should I tell my health care provider before I take this medicine? They need to know if you have any of these conditions: -anemia not caused by low iron levels -high levels of iron in the blood -magnetic resonance imaging (MRI) test scheduled -an unusual or allergic reaction to iron, other medicines, foods, dyes, or preservatives -pregnant or trying to get pregnant -breast-feeding How should I use this medicine? This medicine is for infusion into a vein. It is given by a health care professional in a hospital or clinic setting. Talk to your pediatrician regarding the use of this medicine in children. Special care may be needed. Overdosage: If you think you've taken too much of this medicine contact a poison control center or emergency room at once. Overdosage: If you think you have taken too much of this medicine contact a poison control center or emergency room at once. NOTE: This medicine is only for you. Do not share this medicine with others. What if I miss a dose? It is important not to miss your dose. Call your doctor or health care professional if you are unable to keep an appointment. What may interact with this medicine? This medicine may interact with the following medications: -other iron products This list may not describe all possible interactions. Give your health care provider a list of all the medicines, herbs, non-prescription drugs, or dietary supplements you use. Also  tell them if you smoke, drink alcohol, or use illegal drugs. Some items may interact with your medicine. What should I watch for while using this medicine? Visit your doctor or healthcare professional regularly. Tell your doctor or healthcare professional if your symptoms do not start to get better or if they get worse. You may need blood work done while you are taking this medicine. You may need to follow a special diet. Talk to your doctor. Foods that contain iron include: whole grains/cereals, dried fruits, beans, or peas, leafy green vegetables, and organ meats (liver, kidney). What side effects may I notice from receiving this medicine? Side effects that you should report to your doctor or health care professional as soon as possible: -allergic reactions like skin rash, itching or hives, swelling of the face, lips, or tongue -breathing problems -changes in blood pressure -feeling faint or lightheaded, falls -fever or chills -flushing, sweating, or hot feelings -swelling of the ankles or feet Side effects that usually do not require medical attention (Report these to your doctor or health care professional if they continue or are bothersome.): -diarrhea -headache -nausea, vomiting -stomach pain This list may not describe all possible side effects. Call your doctor for medical advice about side effects. You may report side effects to FDA at 1-800-FDA-1088. Where should I keep my medicine? This drug is given in a hospital or clinic and will not be stored at home. NOTE: This sheet is a summary. It may not cover all possible information. If you have questions about this medicine, talk to your doctor, pharmacist, or  health care provider.  2012, Elsevier/Gold Standard. (07/03/2008 9:48:25 PM)

## 2012-03-06 ENCOUNTER — Encounter (HOSPITAL_COMMUNITY): Payer: Self-pay

## 2012-03-06 ENCOUNTER — Encounter (HOSPITAL_COMMUNITY)
Admission: RE | Admit: 2012-03-06 | Discharge: 2012-03-06 | Disposition: A | Discharge status: Home / Self Care | Payer: 59 | Source: Ambulatory Visit | Attending: Internal Medicine | Admitting: Internal Medicine

## 2012-03-06 MED ORDER — FERUMOXYTOL INJECTION 510 MG/17 ML
INTRAVENOUS | Status: AC
  Administered 2012-03-06: 510 mg via INTRAVENOUS
  Filled 2012-03-06: qty 17

## 2012-03-06 MED ORDER — SODIUM CHLORIDE 0.9 % IV SOLN
INTRAVENOUS | Status: AC
  Administered 2012-03-06: 10:00:00 via INTRAVENOUS

## 2012-03-06 MED ORDER — FERUMOXYTOL INJECTION 510 MG/17 ML
510.0000 mg | INTRAVENOUS | Status: DC
  Administered 2012-03-06: 510 mg via INTRAVENOUS

## 2012-03-06 NOTE — Discharge Instructions (Signed)
Anemia, Nonspecific Your exam and blood tests show you are anemic. This means your blood (hemoglobin) level is low. Normal hemoglobin values are 12 to 15 g/dL for females and 14 to 17 g/dL for males. Make a note of your hemoglobin level today. The hematocrit percent is also used to measure anemia. A normal hematocrit is 38% to 46% in females and 42% to 49% in males. Make a note of your hematocrit level today. CAUSES  Anemia can be due to many different causes.  Excessive bleeding from periods (in women).   Intestinal bleeding.   Poor nutrition.   Kidney, thyroid, liver, and bone marrow diseases.  SYMPTOMS  Anemia can come on suddenly (acute). It can also come on slowly. Symptoms can include:  Minor weakness.   Dizziness.   Palpitations.   Shortness of breath.  Symptoms may be absent until half your hemoglobin is missing if it comes on slowly. Anemia due to acute blood loss from an injury or internal bleeding may require blood transfusion if the loss is severe. Hospital care is needed if you are anemic and there is significant continual blood loss. TREATMENT   Stool tests for blood (Hemoccult) and additional lab tests are often needed. This determines the best treatment.   Further checking on your condition and your response to treatment is very important. It often takes many weeks to correct anemia.  Depending on the cause, treatment can include:  Supplements of iron.   Vitamins B12 and folic acid.   Hormone medicines.If your anemia is due to bleeding, finding the cause of the blood loss is very important. This will help avoid further problems.  SEEK IMMEDIATE MEDICAL CARE IF:   You develop fainting, extreme weakness, shortness of breath, or chest pain.   You develop heavy vaginal bleeding.   You develop bloody or black, tarry stools or vomit up blood.   You develop a high fever, rash, repeated vomiting, or dehydration.  Document Released: 11/18/2004 Document Revised:  09/30/2011 Document Reviewed: 08/26/2009 Exit[employer] Patient Information 2012 [employer]. 

## 2012-03-10 ENCOUNTER — Other Ambulatory Visit (INDEPENDENT_AMBULATORY_CARE_PROVIDER_SITE_OTHER): Payer: 59

## 2012-03-10 DIAGNOSIS — D869 Sarcoidosis, unspecified: Secondary | ICD-10-CM

## 2012-03-10 LAB — HEPATIC FUNCTION PANEL
ALT: 18 U/L (ref 0–35)
AST: 20 U/L (ref 0–37)
Alkaline Phosphatase: 118 U/L — ABNORMAL HIGH (ref 39–117)
Bilirubin, Direct: 0.1 mg/dL (ref 0.0–0.3)
Total Bilirubin: 0.4 mg/dL (ref 0.3–1.2)
Total Protein: 7.1 g/dL (ref 6.0–8.3)

## 2012-03-10 LAB — CBC WITH DIFFERENTIAL/PLATELET
Basophils Relative: 0.3 % (ref 0.0–3.0)
Eosinophils Relative: 1 % (ref 0.0–5.0)
Lymphocytes Relative: 5.4 % — ABNORMAL LOW (ref 12.0–46.0)
MCV: 71.3 fl — ABNORMAL LOW (ref 78.0–100.0)
Monocytes Absolute: 0.6 10*3/uL (ref 0.1–1.0)
Monocytes Relative: 5.9 % (ref 3.0–12.0)
Neutrophils Relative %: 87.4 % — ABNORMAL HIGH (ref 43.0–77.0)
RBC: 4.59 Mil/uL (ref 3.87–5.11)
WBC: 9.7 10*3/uL (ref 4.5–10.5)

## 2012-03-13 ENCOUNTER — Telehealth: Payer: Self-pay | Admitting: Pulmonary Disease

## 2012-03-13 NOTE — Telephone Encounter (Signed)
Notes Recorded by Gwynneth Albright, CMA on 03/13/2012 at 10:06 AM LMOMTCB x 1 for the pt. ------  Notes Recorded by Barbaraann Share, MD on 03/10/2012 at 5:46 PM Let pt know that labwork is ok.  ---  lmomtcb to inform pt of lab results

## 2012-03-14 NOTE — Telephone Encounter (Signed)
I spoke with patient about results and she verbalized understanding and had no questions 

## 2012-03-17 ENCOUNTER — Other Ambulatory Visit: Payer: Self-pay | Admitting: Internal Medicine

## 2012-03-17 NOTE — Telephone Encounter (Signed)
Please advise on Xanax refill for this JWJ pt, thanks!

## 2012-03-17 NOTE — Telephone Encounter (Signed)
Rx faxed to pharmacy  

## 2012-03-31 ENCOUNTER — Ambulatory Visit (INDEPENDENT_AMBULATORY_CARE_PROVIDER_SITE_OTHER): Payer: 59 | Admitting: Pulmonary Disease

## 2012-03-31 DIAGNOSIS — D869 Sarcoidosis, unspecified: Secondary | ICD-10-CM

## 2012-03-31 LAB — PULMONARY FUNCTION TEST

## 2012-03-31 NOTE — Progress Notes (Signed)
PFT done today. 

## 2012-04-01 ENCOUNTER — Other Ambulatory Visit: Payer: Self-pay | Admitting: Internal Medicine

## 2012-04-03 NOTE — Telephone Encounter (Signed)
Done hardcopy to sharon 

## 2012-04-03 NOTE — Telephone Encounter (Signed)
Ambien request [last refill 12.01.12 #[id number]]/SLS Please advise.

## 2012-04-03 NOTE — Telephone Encounter (Signed)
Faxed Rx to pharmacy/SLS

## 2012-04-04 NOTE — Telephone Encounter (Signed)
Faxed hardcopy to pharmacy. 

## 2012-04-05 ENCOUNTER — Telehealth: Payer: Self-pay | Admitting: Pulmonary Disease

## 2012-04-05 NOTE — Telephone Encounter (Signed)
Nicole Melendez, please let pt know that her breathing studies are much improved from last year.  Good news.

## 2012-04-07 NOTE — Telephone Encounter (Signed)
Pt aware of PFT results per [facility] and also needed to schedule a follow-up per GYN for surgical clearance. TAH to be done the 2nd week of Aug 2013. Pt has appt 05/29/12 @ 12pm.

## 2012-04-11 ENCOUNTER — Telehealth: Payer: Self-pay | Admitting: Pulmonary Disease

## 2012-04-11 NOTE — Telephone Encounter (Signed)
Please let pt know that her breathing studies are IMPROVED from last study.

## 2012-04-11 NOTE — Telephone Encounter (Signed)
Patient notified her breathing study has improved from previous one.

## 2012-05-08 ENCOUNTER — Other Ambulatory Visit: Payer: Self-pay | Admitting: Internal Medicine

## 2012-05-08 MED ORDER — OMEPRAZOLE 20 MG PO CPDR: 20.0000 mg | DELAYED_RELEASE_CAPSULE | Freq: Two times a day (BID) | ORAL | Status: DC

## 2012-05-09 ENCOUNTER — Inpatient Hospital Stay (HOSPITAL_COMMUNITY)
Admission: AD | Admit: 2012-05-09 | Discharge: 2012-05-10 | Disposition: A | Discharge status: Home / Self Care | Payer: 59 | Source: Ambulatory Visit | Attending: Obstetrics and Gynecology | Admitting: Obstetrics and Gynecology

## 2012-05-09 DIAGNOSIS — N946 Dysmenorrhea, unspecified: Secondary | ICD-10-CM

## 2012-05-09 DIAGNOSIS — N920 Excessive and frequent menstruation with regular cycle: Secondary | ICD-10-CM

## 2012-05-10 ENCOUNTER — Encounter (HOSPITAL_COMMUNITY): Payer: Self-pay | Admitting: *Deleted

## 2012-05-10 MED ORDER — KETOROLAC TROMETHAMINE 60 MG/2ML IM SOLN
60.0000 mg | Freq: Once | INTRAMUSCULAR | Status: AC
  Administered 2012-05-10: 60 mg via INTRAMUSCULAR
  Filled 2012-05-10: qty 2

## 2012-05-10 MED ORDER — TRAMADOL HCL 50 MG PO TABS: 50.0000 mg | ORAL_TABLET | Freq: Four times a day (QID) | ORAL | Status: AC | PRN

## 2012-05-10 MED ORDER — NORETHINDRONE ACETATE 5 MG PO TABS: 5.0000 mg | ORAL_TABLET | Freq: Every day | ORAL | Status: DC

## 2012-05-10 NOTE — MAU Provider Note (Signed)
History     CSN: [id number]  Arrival date and time: 05/09/12 2357   First Provider Initiated Contact with Patient 05/10/12 0153      Chief Complaint  Patient presents with  . Vaginal Bleeding   HPI Menorrhagia, dysmenorrhea. Denies dizziness, pain worse, bleeding worse recently, Tramadol didn't help much. Awaiting hysterectomy. Anemia. Several medical problems, reviewed medical hx and meds.   Past Medical History  Diagnosis Date  . Morbid obesity   . Peptic ulcer disease   . GERD (gastroesophageal reflux disease)   . Migraine   . Anxiety   . Anemia   . Hyperlipidemia   . Menorrhagia   . PNA (pneumonia) july 2011  . Diabetes mellitus type II     steroid related  . Depression   . Sarcoid     including hand per rheumatology-Dr. Dierdre Forth  . Allergic rhinitis, cause unspecified   . Varicose veins with pain   . Positive ANA (antinuclear antibody) 02/14/2012    Past Surgical History  Procedure Date  . Uterine ablation 03/2010  . Wisdom tooth extraction     Family History  Problem Relation Age of Onset  . Diabetes    . Hypertension    . Allergies Mother   . Heart attack Mother   . Heart disease Father   . Rheum arthritis Father   . Ovarian cancer Maternal Aunt   . Lung cancer Maternal Aunt   . Breast cancer Maternal Aunt   . Bone cancer Maternal Aunt   . Other Neg Hx     History  Substance Use Topics  . Smoking status: Former Smoker -- 1.0 packs/day for 20 years    Types: Cigarettes    Quit date: 04/24/2010  . Smokeless tobacco: Not on file   Comment: less than 1 ppd.  started at age 31.    Marland Kitchen Alcohol Use: No    Allergies:  Allergies  Allergen Reactions  . Azithromycin     (z pak) hives  . Oxycodone-Acetaminophen     REACTION: difficulty breathing  . Penicillins     REACTION: swelling and difficulty breathing    Prescriptions prior to admission  Medication Sig Dispense Refill  . acetaminophen (TYLENOL) 650 MG CR tablet 1-2 tablets by mouth 3  times a day as needed       . ALPRAZolam (XANAX) 0.5 MG tablet TAKE 1 TABLET BY MOUTH THREE TIMES DAILY AS NEEDED FOR SLEEP OR ANXIETY  90 tablet  0  . CELEBREX 200 MG capsule Take 200 mg by mouth daily. Once a day as needed      . cetirizine (ZYRTEC) 10 MG tablet Take 10 mg by mouth daily.        . cyclobenzaprine (FLEXERIL) 5 MG tablet Take 5 mg by mouth as needed.        . furosemide (LASIX) 20 MG tablet Take 20 mg by mouth daily as needed.        . levalbuterol (XOPENEX HFA) 45 MCG/ACT inhaler Inhale 1-2 puffs into the lungs every 4 (four) hours as needed for wheezing.  1 Inhaler  12  . methotrexate (RHEUMATREX) 2.5 MG tablet TAKE 4 TABLETS BY MOUTH ONCE A WEEK  16 tablet  3  . NOVOLOG FLEXPEN 100 UNIT/ML injection USE AS DIRECTED PER SLIDING SCALE  15 mL  11  . omeprazole (PRILOSEC) 20 MG capsule Take 1 capsule (20 mg total) by mouth 2 (two) times daily.  60 capsule  11  . predniSONE (DELTASONE) 5  MG tablet TAKE AS DIRECTED  100 tablet  0  . sertraline (ZOLOFT) 50 MG tablet TAKE 1 TABLET BY MOUTH DAILY  30 tablet  11  . sitaGLIPtan-metformin (JANUMET) 50-500 MG per tablet Take 1 tablet by mouth daily.        . SYMBICORT 160-4.5 MCG/ACT inhaler INHALE 2 PUFFS BY MOUTH TWICE DAILY  10.2 g  6  . traMADol (ULTRAM) 50 MG tablet Take 50 mg by mouth as needed. Maximum dose= 8 tablets per day       . zolpidem (AMBIEN CR) 12.5 MG CR tablet TAKE 1 TABLET BY MOUTH AT BEDTIME AS NEEDED  30 tablet  5  . atorvastatin (LIPITOR) 10 MG tablet Take 1 tablet (10 mg total) by mouth daily.  90 tablet  3  . B-D UF III MINI PEN NEEDLES 31G X 5 MM MISC USE FOUR TIMES DAILY AS NEEDED PER SLIDING SCALE  200 each  11  . benzonatate (TESSALON PERLES) 100 MG capsule 1-2 tabs by mouth up to three times per day as needed for cough  90 capsule  1  . ferumoxytol (FERAHEME) 510 MG/17ML SOLN 510 mg IV x 1 , with repeat dose in 1 week  15.08 mL  1  . fluticasone (FLONASE) 50 MCG/ACT nasal spray 2 sprays by Nasal route daily.   16 g  2  . HYDROmorphone (DILAUDID) 2 MG tablet 1/2 - 1 po bid as needed for pain  15 tablet  0  . ONE TOUCH ULTRA TEST test strip USE TO TEST FOUR TIMES DAILY  200 each  11  . ONETOUCH DELICA LANCETS MISC USE TO TEST FOUR TIMES DAILY  200 each  11  . SUMAtriptan (IMITREX) 100 MG tablet Take 1 tablet (100 mg total) by mouth every 2 (two) hours as needed for migraine.  10 tablet  5    ROS Physical Exam   Blood pressure 135/86, pulse 80, temperature 98.6 F (37 C), temperature source Oral, resp. rate 20, height 5\' 7"  (1.702 m), weight 159.213 kg (351 lb), last menstrual period 05/07/2012, SpO2 100.00%.  Physical Exam A&O x 3, no acute distress. Pleasant CV RRR, S1S2 normal Abdo soft, non tender, non acute Extr no edema/ tenderness Pelvic deferred UPT not done since not sexually active with female  MAU Course  Procedures Toradol injection IM.   Assessment and Plan  Menorrhagia, dysmenorrhea.  Discharge home since pain improved and is stable.  Office f/up in 2 wks.  Aygestin to control bleeding, Ultram for pain mngmt Add Iron bid, see PCP to check if repeat iron infusion needed.  Bleeding precautions.   Nicole Melendez R 05/10/2012, 1:58 AM

## 2012-05-10 NOTE — MAU Note (Signed)
Vaginal bleeding x3 days. Bright red with few tennis ball sized clots today. States went through a "bag of pads" today that were all saturated. Lower back pain that is sharp & constant & she states is related to her sarcoidosis diagnosis. Scheduled for a hysterectomy in August with Dr. Billy Coast.

## 2012-05-10 NOTE — MAU Note (Signed)
Pt reports bleeding every 15-16 days for a 5-8 day duration of bleeding. This episode the pt reports day 3 of bleeding with tennis ball sized clots. Pt reports changing a pad every 30-60 minutes and back"spasms". Surgery scheduled August 22nd.

## 2012-05-12 ENCOUNTER — Ambulatory Visit (INDEPENDENT_AMBULATORY_CARE_PROVIDER_SITE_OTHER): Payer: 59 | Admitting: Internal Medicine

## 2012-05-12 ENCOUNTER — Encounter: Payer: Self-pay | Admitting: Internal Medicine

## 2012-05-12 VITALS — BP 122/70 | HR 92 | Temp 98.2°F | Ht 67.0 in | Wt 351.5 lb

## 2012-05-12 DIAGNOSIS — M130 Polyarthritis, unspecified: Secondary | ICD-10-CM

## 2012-05-12 DIAGNOSIS — E119 Type 2 diabetes mellitus without complications: Secondary | ICD-10-CM

## 2012-05-12 DIAGNOSIS — Z0000 Encounter for general adult medical examination without abnormal findings: Secondary | ICD-10-CM

## 2012-05-12 DIAGNOSIS — N920 Excessive and frequent menstruation with regular cycle: Secondary | ICD-10-CM

## 2012-05-12 MED ORDER — PREDNISONE 10 MG PO TABS: ORAL_TABLET | ORAL | Status: DC

## 2012-05-12 MED ORDER — HYDROMORPHONE HCL 2 MG PO TABS: ORAL_TABLET | ORAL | Status: DC

## 2012-05-12 NOTE — Patient Instructions (Addendum)
Take all new medications as prescribed  - the pain medication, and the prednisone Continue all other medications as before Please keep your appointments with your specialists as you have planned - including the surgury for aug 22  You are given the work note today You should consider follow up with Dr Dierdre Forth sooner to consider possible Humira for after august surgury if you continue to have flares of the joints

## 2012-05-13 ENCOUNTER — Encounter: Payer: Self-pay | Admitting: Internal Medicine

## 2012-05-13 NOTE — Assessment & Plan Note (Signed)
With marked bilat wrist and hand swelling/warm/tender and other joint pain - for pain control, depomedrol IM, predpak, and f/u rheum as planned, gave note for work,

## 2012-05-13 NOTE — Assessment & Plan Note (Signed)
stable overall by hx and exam, most recent data reviewed with pt, and pt to continue medical treatment as before, to call with cbg > 200 with steroid tx Lab Results  Component Value Date   HGBA1C 6.0 02/11/2012

## 2012-05-13 NOTE — Assessment & Plan Note (Signed)
To f/u with surgury as planned aug 22

## 2012-05-13 NOTE — Progress Notes (Signed)
Subjective:    Patient ID: Nicole Melendez, female    DOB: 27-Aug-1976, 36 y.o.   MRN: [id number]  HPI  Here with marked flare symmetrical bilat wrists and mult other joints including bilat MCP's, left knee, right ankle, left lower back pain/swelling/warmth again x 4-5 days , about 9/10, not able to work, worse to move wrists and hands, nothing makes better.  Has seen Rheumatology, assured per pt it was not gout, and is already on MTX in the setting of sarcoidosis.  Was seen in ER, with toradol 60 mg no help per pt.  Overall good compliance with treatment, and good medicine tolerability - including the celebrex, flexeril and tramadol, none helping at this time. Has ongoing menorrhagia, for TAH scheduled aug 22.  Pt denies chest pain, increased sob or doe, wheezing, orthopnea, PND, increased LE swelling, palpitations, dizziness or syncope.   Pt denies polydipsia, polyuria.   Pt denies fever, wt loss, night sweats, loss of appetite, or other constitutional symptoms Past Medical History  Diagnosis Date  . Morbid obesity   . Peptic ulcer disease   . GERD (gastroesophageal reflux disease)   . Migraine   . Anxiety   . Anemia   . Hyperlipidemia   . Menorrhagia   . PNA (pneumonia) july 2011  . Diabetes mellitus type II     steroid related  . Depression   . Sarcoid     including hand per rheumatology-Dr. Dierdre Forth  . Allergic rhinitis, cause unspecified   . Varicose veins with pain   . Positive ANA (antinuclear antibody) 02/14/2012   Past Surgical History  Procedure Date  . Uterine ablation 03/2010  . Wisdom tooth extraction     reports that she quit smoking about 2 years ago. Her smoking use included Cigarettes. She has a 20 pack-year smoking history. She does not have any smokeless tobacco history on file. She reports that she does not drink alcohol or use illicit drugs. family history includes Allergies in her mother; Bone cancer in her maternal aunt; Breast cancer in her maternal aunt; Diabetes  in an unspecified family member; Heart attack in her mother; Heart disease in her father; Hypertension in an unspecified family member; Lung cancer in her maternal aunt; Ovarian cancer in her maternal aunt; and Rheum arthritis in her father.  There is no history of Other. Allergies  Allergen Reactions  . Azithromycin     (z pak) hives  . Oxycodone-Acetaminophen     REACTION: difficulty breathing  . Penicillins     REACTION: swelling and difficulty breathing   Current Outpatient Prescriptions on File Prior to Visit  Medication Sig Dispense Refill  . acetaminophen (TYLENOL) 650 MG CR tablet 1-2 tablets by mouth 3 times a day as needed       . ALPRAZolam (XANAX) 0.5 MG tablet TAKE 1 TABLET BY MOUTH THREE TIMES DAILY AS NEEDED FOR SLEEP OR ANXIETY  90 tablet  0  . atorvastatin (LIPITOR) 10 MG tablet Take 1 tablet (10 mg total) by mouth daily.  90 tablet  3  . B-D UF III MINI PEN NEEDLES 31G X 5 MM MISC USE FOUR TIMES DAILY AS NEEDED PER SLIDING SCALE  200 each  11  . benzonatate (TESSALON PERLES) 100 MG capsule 1-2 tabs by mouth up to three times per day as needed for cough  90 capsule  1  . CELEBREX 200 MG capsule Take 200 mg by mouth daily. Once a day as needed      .  cetirizine (ZYRTEC) 10 MG tablet Take 10 mg by mouth daily.        . cyclobenzaprine (FLEXERIL) 5 MG tablet Take 5 mg by mouth as needed.        . ferumoxytol (FERAHEME) 510 MG/17ML SOLN 510 mg IV x 1 , with repeat dose in 1 week  15.08 mL  1  . furosemide (LASIX) 20 MG tablet Take 20 mg by mouth daily as needed.        . levalbuterol (XOPENEX HFA) 45 MCG/ACT inhaler Inhale 1-2 puffs into the lungs every 4 (four) hours as needed for wheezing.  1 Inhaler  12  . methotrexate (RHEUMATREX) 2.5 MG tablet TAKE 4 TABLETS BY MOUTH ONCE A WEEK  16 tablet  3  . norethindrone (AYGESTIN) 5 MG tablet Take 1 tablet (5 mg total) by mouth daily.  30 tablet  0  . NOVOLOG FLEXPEN 100 UNIT/ML injection USE AS DIRECTED PER SLIDING SCALE  15 mL  11    . omeprazole (PRILOSEC) 20 MG capsule Take 1 capsule (20 mg total) by mouth 2 (two) times daily.  60 capsule  11  . ONE TOUCH ULTRA TEST test strip USE TO TEST FOUR TIMES DAILY  200 each  11  . ONETOUCH DELICA LANCETS MISC USE TO TEST FOUR TIMES DAILY  200 each  11  . predniSONE (DELTASONE) 5 MG tablet TAKE AS DIRECTED  100 tablet  0  . sertraline (ZOLOFT) 50 MG tablet TAKE 1 TABLET BY MOUTH DAILY  30 tablet  11  . sitaGLIPtan-metformin (JANUMET) 50-500 MG per tablet Take 1 tablet by mouth daily.        . SYMBICORT 160-4.5 MCG/ACT inhaler INHALE 2 PUFFS BY MOUTH TWICE DAILY  10.2 g  6  . traMADol (ULTRAM) 50 MG tablet Take 50 mg by mouth as needed. Maximum dose= 8 tablets per day       . traMADol (ULTRAM) 50 MG tablet Take 1 tablet (50 mg total) by mouth every 6 (six) hours as needed for pain.  15 tablet  0  . zolpidem (AMBIEN CR) 12.5 MG CR tablet TAKE 1 TABLET BY MOUTH AT BEDTIME AS NEEDED  30 tablet  5  . fluticasone (FLONASE) 50 MCG/ACT nasal spray 2 sprays by Nasal route daily.  16 g  2  . SUMAtriptan (IMITREX) 100 MG tablet Take 1 tablet (100 mg total) by mouth every 2 (two) hours as needed for migraine.  10 tablet  5   Review of Systems Review of Systems  Constitutional: Negative for diaphoresis and unexpected weight change.  HENT: Negative for tinnitus.   Eyes: Negative for photophobia and visual disturbance.  Respiratory: Negative for choking and stridor.   Gastrointestinal: Negative for vomiting and blood in stool.  Genitourinary: Negative for hematuria and decreased urine volume.  Skin: Negative for color change and wound.  Neurological: Negative for tremors and numbness.     Objective:   Physical Exam BP 122/70  Pulse 92  Temp 98.2 F (36.8 C) (Oral)  Ht 5\' 7"  (1.702 m)  Wt 351 lb 8 oz (159.439 kg)  BMI 55.05 kg/m2  SpO2 97%  LMP 05/07/2012 Physical Exam  VS noted, uncomfortable in pain Constitutional: Pt appears well-developed and well-nourished.  HENT: Head:  Normocephalic.  Right Ear: External ear normal.  Left Ear: External ear normal.  Eyes: Conjunctivae and EOM are normal. Pupils are equal, round, and reactive to light.  Neck: Normal range of motion. Neck supple.  Cardiovascular: Normal rate and  regular rhythm.   Pulmonary/Chest: Effort normal and breath sounds  - no rales or wheezing.  Bilat wrists with 1-2+ swelling, warm, tender with swelling or hands and less tender to bilat MCP's, left knee mild warm without effusion, right ankle mild warm without effusion, tender of left SI joint area, spine nontender Neurological: Pt is alert. Not confused Skin: Skin is warm. No erythema.  Psychiatric: Pt behavior is normal. Thought content normal.     Assessment & Plan:

## 2012-05-29 ENCOUNTER — Ambulatory Visit: Payer: 59 | Admitting: Pulmonary Disease

## 2012-05-30 ENCOUNTER — Encounter: Payer: Self-pay | Admitting: Endocrinology

## 2012-05-30 ENCOUNTER — Ambulatory Visit (INDEPENDENT_AMBULATORY_CARE_PROVIDER_SITE_OTHER): Payer: 59 | Admitting: Endocrinology

## 2012-05-30 VITALS — BP 118/70 | HR 85 | Temp 99.2°F | Resp 16 | Wt 347.0 lb

## 2012-05-30 DIAGNOSIS — J069 Acute upper respiratory infection, unspecified: Secondary | ICD-10-CM

## 2012-05-30 MED ORDER — DOXYCYCLINE HYCLATE 100 MG PO TABS: 100.0000 mg | ORAL_TABLET | Freq: Two times a day (BID) | ORAL | Status: AC

## 2012-05-30 MED ORDER — FLUCONAZOLE 150 MG PO TABS: 150.0000 mg | ORAL_TABLET | Freq: Every day | ORAL | Status: AC

## 2012-05-30 NOTE — Patient Instructions (Addendum)
i have sent 2 prescriptions to your pharmacy: for ear infections, and for thrush.   Don't take lipitor while on these new medications. I hope you feel better soon.  If you don't feel better by next week, please call back.

## 2012-05-30 NOTE — Progress Notes (Signed)
Subjective:    Patient ID: Nicole Melendez, female    DOB: June 07, 1976, 36 y.o.   MRN: [id number]  HPI Pt states few days of moderate pain at the throat, but no assoc fever.  Because she is on steroids for sarcoidosis, she wonders if she has thrush.   Past Medical History  Diagnosis Date  . Morbid obesity   . Peptic ulcer disease   . GERD (gastroesophageal reflux disease)   . Migraine   . Anxiety   . Anemia   . Hyperlipidemia   . Menorrhagia   . PNA (pneumonia) july 2011  . Diabetes mellitus type II     steroid related  . Depression   . Sarcoid     including hand per rheumatology-Dr. Dierdre Forth  . Allergic rhinitis, cause unspecified   . Varicose veins with pain   . Positive ANA (antinuclear antibody) 02/14/2012    Past Surgical History  Procedure Date  . Uterine ablation 03/2010  . Wisdom tooth extraction     History   Social History  . Marital Status: Single    Spouse Name: N/A    Number of Children: 0  . Years of Education: N/A   Occupational History  . tobacco worker    Social History Main Topics  . Smoking status: Former Smoker -- 1.0 packs/day for 20 years    Types: Cigarettes    Quit date: 04/24/2010  . Smokeless tobacco: Not on file   Comment: less than 1 ppd.  started at age 57.    Marland Kitchen Alcohol Use: No  . Drug Use: No  . Sexually Active: Yes   Other Topics Concern  . Not on file   Social History Narrative   Pt lives with Dalbert Batman- also pt of [facility]    Current Outpatient Prescriptions on File Prior to Visit  Medication Sig Dispense Refill  . acetaminophen (TYLENOL) 650 MG CR tablet 1-2 tablets by mouth 3 times a day as needed       . ALPRAZolam (XANAX) 0.5 MG tablet TAKE 1 TABLET BY MOUTH THREE TIMES DAILY AS NEEDED FOR SLEEP OR ANXIETY  90 tablet  0  . atorvastatin (LIPITOR) 10 MG tablet Take 1 tablet (10 mg total) by mouth daily.  90 tablet  3  . B-D UF III MINI PEN NEEDLES 31G X 5 MM MISC USE FOUR TIMES DAILY AS NEEDED PER SLIDING SCALE  200 each   11  . benzonatate (TESSALON PERLES) 100 MG capsule 1-2 tabs by mouth up to three times per day as needed for cough  90 capsule  1  . CELEBREX 200 MG capsule Take 200 mg by mouth daily. Once a day as needed      . cetirizine (ZYRTEC) 10 MG tablet Take 10 mg by mouth daily.        . cyclobenzaprine (FLEXERIL) 5 MG tablet Take 5 mg by mouth as needed.        . ferumoxytol (FERAHEME) 510 MG/17ML SOLN 510 mg IV x 1 , with repeat dose in 1 week  15.08 mL  1  . furosemide (LASIX) 20 MG tablet Take 20 mg by mouth daily as needed.        Marland Kitchen HYDROmorphone (DILAUDID) 2 MG tablet 1/2 - 1 po bid as needed for pain  15 tablet  0  . levalbuterol (XOPENEX HFA) 45 MCG/ACT inhaler Inhale 1-2 puffs into the lungs every 4 (four) hours as needed for wheezing.  1 Inhaler  12  .  methotrexate (RHEUMATREX) 2.5 MG tablet TAKE 4 TABLETS BY MOUTH ONCE A WEEK  16 tablet  3  . norethindrone (AYGESTIN) 5 MG tablet Take 1 tablet (5 mg total) by mouth daily.  30 tablet  0  . NOVOLOG FLEXPEN 100 UNIT/ML injection USE AS DIRECTED PER SLIDING SCALE  15 mL  11  . omeprazole (PRILOSEC) 20 MG capsule Take 1 capsule (20 mg total) by mouth 2 (two) times daily.  60 capsule  11  . ONE TOUCH ULTRA TEST test strip USE TO TEST FOUR TIMES DAILY  200 each  11  . ONETOUCH DELICA LANCETS MISC USE TO TEST FOUR TIMES DAILY  200 each  11  . predniSONE (DELTASONE) 5 MG tablet TAKE AS DIRECTED  100 tablet  0  . sertraline (ZOLOFT) 50 MG tablet TAKE 1 TABLET BY MOUTH DAILY  30 tablet  11  . sitaGLIPtan-metformin (JANUMET) 50-500 MG per tablet Take 1 tablet by mouth daily.        . SYMBICORT 160-4.5 MCG/ACT inhaler INHALE 2 PUFFS BY MOUTH TWICE DAILY  10.2 g  6  . traMADol (ULTRAM) 50 MG tablet Take 50 mg by mouth as needed. Maximum dose= 8 tablets per day       . zolpidem (AMBIEN CR) 12.5 MG CR tablet TAKE 1 TABLET BY MOUTH AT BEDTIME AS NEEDED  30 tablet  5  . fluticasone (FLONASE) 50 MCG/ACT nasal spray 2 sprays by Nasal route daily.  16 g  2  .  SUMAtriptan (IMITREX) 100 MG tablet Take 1 tablet (100 mg total) by mouth every 2 (two) hours as needed for migraine.  10 tablet  5    Allergies  Allergen Reactions  . Azithromycin     (z pak) hives  . Oxycodone-Acetaminophen     REACTION: difficulty breathing  . Penicillins     REACTION: swelling and difficulty breathing    Family History  Problem Relation Age of Onset  . Diabetes    . Hypertension    . Allergies Mother   . Heart attack Mother   . Heart disease Father   . Rheum arthritis Father   . Ovarian cancer Maternal Aunt   . Lung cancer Maternal Aunt   . Breast cancer Maternal Aunt   . Bone cancer Maternal Aunt   . Other Neg Hx     BP 118/70  Pulse 85  Temp 99.2 F (37.3 C) (Oral)  Resp 16  Wt 347 lb (157.398 kg)  SpO2 98%  LMP 05/07/2012    Review of Systems Denies nasal congestion, but she has earache.      Objective:   Physical Exam VITAL SIGNS:  See vs page GENERAL: no distress head: no deformity eyes: no periorbital swelling, no proptosis external nose and ears are normal mouth: no lesion seen, but there is erythema Both tm's are red.        Assessment & Plan:  URI.  New Sarcoidosis. She is at risk for thrush, in view of steroid rx Dyslipidemia.  There is a potential drug-drug interaction between lipitor and diflucan

## 2012-06-01 ENCOUNTER — Other Ambulatory Visit: Payer: Self-pay | Admitting: Pulmonary Disease

## 2012-06-05 ENCOUNTER — Other Ambulatory Visit (HOSPITAL_COMMUNITY): Payer: 59

## 2012-06-05 ENCOUNTER — Other Ambulatory Visit: Payer: Self-pay | Admitting: Internal Medicine

## 2012-06-05 NOTE — Telephone Encounter (Signed)
Faxed hardcopy to pharmacy. 

## 2012-06-05 NOTE — Telephone Encounter (Signed)
Done hardcopy to robin  

## 2012-06-27 ENCOUNTER — Other Ambulatory Visit: Payer: Self-pay | Admitting: Pulmonary Disease

## 2012-06-27 ENCOUNTER — Other Ambulatory Visit (INDEPENDENT_AMBULATORY_CARE_PROVIDER_SITE_OTHER): Payer: 59

## 2012-06-27 ENCOUNTER — Other Ambulatory Visit: Payer: 59

## 2012-06-27 DIAGNOSIS — D869 Sarcoidosis, unspecified: Secondary | ICD-10-CM

## 2012-06-27 DIAGNOSIS — Z0000 Encounter for general adult medical examination without abnormal findings: Secondary | ICD-10-CM

## 2012-06-27 DIAGNOSIS — E119 Type 2 diabetes mellitus without complications: Secondary | ICD-10-CM

## 2012-06-27 LAB — HEPATIC FUNCTION PANEL
AST: 19 U/L (ref 0–37)
Albumin: 3.6 g/dL (ref 3.5–5.2)
Alkaline Phosphatase: 91 U/L (ref 39–117)
Alkaline Phosphatase: 90 U/L (ref 39–117)
Bilirubin, Direct: 0 mg/dL (ref 0.0–0.3)
Total Bilirubin: 0.3 mg/dL (ref 0.3–1.2)
Total Bilirubin: 0.5 mg/dL (ref 0.3–1.2)

## 2012-06-27 LAB — CBC WITH DIFFERENTIAL/PLATELET
Basophils Absolute: 0 10*3/uL (ref 0.0–0.1)
Basophils Absolute: 0 10*3/uL (ref 0.0–0.1)
Basophils Relative: 0.3 % (ref 0.0–3.0)
Eosinophils Absolute: 0.1 10*3/uL (ref 0.0–0.7)
HCT: 38.4 % (ref 36.0–46.0)
Hemoglobin: 12.5 g/dL (ref 12.0–15.0)
Hemoglobin: 12.6 g/dL (ref 12.0–15.0)
Lymphocytes Relative: 8 % — ABNORMAL LOW (ref 12.0–46.0)
Lymphocytes Relative: 7.5 % — ABNORMAL LOW (ref 12.0–46.0)
Lymphs Abs: 0.6 10*3/uL — ABNORMAL LOW (ref 0.7–4.0)
MCHC: 32.6 g/dL (ref 30.0–36.0)
Monocytes Relative: 5.7 % (ref 3.0–12.0)
Neutro Abs: 6.8 10*3/uL (ref 1.4–7.7)
Neutro Abs: 6.8 10*3/uL (ref 1.4–7.7)
RBC: 4.57 Mil/uL (ref 3.87–5.11)
RDW: 16.6 % — ABNORMAL HIGH (ref 11.5–14.6)
RDW: 16.2 % — ABNORMAL HIGH (ref 11.5–14.6)
WBC: 8 10*3/uL (ref 4.5–10.5)

## 2012-06-27 LAB — TSH: TSH: 1.55 u[IU]/mL (ref 0.35–5.50)

## 2012-06-27 LAB — BASIC METABOLIC PANEL
BUN: 10 mg/dL (ref 6–23)
CO2: 25 mEq/L (ref 19–32)
Calcium: 9.1 mg/dL (ref 8.4–10.5)
Chloride: 104 mEq/L (ref 96–112)
Creatinine, Ser: 0.9 mg/dL (ref 0.4–1.2)

## 2012-06-27 LAB — HEMOGLOBIN A1C: Hgb A1c MFr Bld: 6.8 % — ABNORMAL HIGH (ref 4.6–6.5)

## 2012-06-27 LAB — LIPID PANEL
LDL Cholesterol: 98 mg/dL (ref 0–99)
Total CHOL/HDL Ratio: 4
Triglycerides: 111 mg/dL (ref 0.0–149.0)

## 2012-06-29 ENCOUNTER — Encounter: Payer: Self-pay | Admitting: Internal Medicine

## 2012-07-03 ENCOUNTER — Other Ambulatory Visit: Payer: Self-pay | Admitting: Internal Medicine

## 2012-07-05 ENCOUNTER — Ambulatory Visit: Payer: 59

## 2012-07-05 ENCOUNTER — Encounter (HOSPITAL_COMMUNITY): Payer: Self-pay

## 2012-07-05 DIAGNOSIS — E119 Type 2 diabetes mellitus without complications: Secondary | ICD-10-CM

## 2012-07-05 LAB — URINALYSIS
Nitrite: NEGATIVE
Total Protein, Urine: NEGATIVE
pH: 6.6 (ref 5.0–8.0)

## 2012-07-06 ENCOUNTER — Other Ambulatory Visit: Payer: Self-pay | Admitting: Internal Medicine

## 2012-07-06 NOTE — Telephone Encounter (Signed)
Done hardcopy to robin  

## 2012-07-07 NOTE — Telephone Encounter (Signed)
Faxed hardcopy to pharmacy. 

## 2012-07-12 ENCOUNTER — Encounter (HOSPITAL_COMMUNITY): Payer: Self-pay | Admitting: Pharmacist

## 2012-07-13 ENCOUNTER — Other Ambulatory Visit: Payer: Self-pay | Admitting: Obstetrics and Gynecology

## 2012-07-13 ENCOUNTER — Ambulatory Visit (INDEPENDENT_AMBULATORY_CARE_PROVIDER_SITE_OTHER): Payer: 59 | Admitting: Internal Medicine

## 2012-07-13 ENCOUNTER — Encounter: Payer: Self-pay | Admitting: Internal Medicine

## 2012-07-13 VITALS — BP 122/86 | HR 90 | Temp 98.2°F | Ht 67.0 in | Wt 350.1 lb

## 2012-07-13 DIAGNOSIS — E119 Type 2 diabetes mellitus without complications: Secondary | ICD-10-CM

## 2012-07-13 DIAGNOSIS — F329 Major depressive disorder, single episode, unspecified: Secondary | ICD-10-CM

## 2012-07-13 DIAGNOSIS — K529 Noninfective gastroenteritis and colitis, unspecified: Secondary | ICD-10-CM

## 2012-07-13 DIAGNOSIS — K5289 Other specified noninfective gastroenteritis and colitis: Secondary | ICD-10-CM

## 2012-07-13 MED ORDER — PROMETHAZINE HCL 25 MG PO TABS: 25.0000 mg | ORAL_TABLET | Freq: Four times a day (QID) | ORAL | Status: DC | PRN

## 2012-07-13 MED ORDER — DIPHENOXYLATE-ATROPINE 2.5-0.025 MG PO TABS: 1.0000 | ORAL_TABLET | Freq: Four times a day (QID) | ORAL | Status: DC | PRN

## 2012-07-13 NOTE — Patient Instructions (Addendum)
Take all new medications as prescribed Continue all other medications as before Please go to LAB in the Basement for the stool tests to be done You are given the work note You should be ok if the illness takes it course as usual (viral gastroenteritis) for surgury next wk Please drink more fluids in the next week

## 2012-07-13 NOTE — Progress Notes (Signed)
Subjective:    Patient ID: Nicole Melendez, female    DOB: 05/04/1976, 36 y.o.   MRN: [id number]  HPI  Here with acute onset 2-3 days nausea, crampy abd pains, feeling warm and 5-7 loose stools per day, and myalgias without chills, rash, joint pain, blood or orthostasis. No other sick contacts.  Pt denies chest pain, increased sob or doe, wheezing, orthopnea, PND, increased LE swelling, palpitations, dizziness or syncope.  Pt denies new neurological symptoms such as new headache, or facial or extremity weakness or numbness   Pt denies polydipsia, polyuria, Denies worsening depressive symptoms, suicidal ideation, or panic, though has ongoing anxiety, not increased recently.  Overall good compliance with treatment, and good medicine tolerability.  Due for surgury next week Past Medical History  Diagnosis Date  . Morbid obesity   . Peptic ulcer disease   . GERD (gastroesophageal reflux disease)   . Migraine   . Anxiety   . Anemia   . Hyperlipidemia   . Menorrhagia   . PNA (pneumonia) july 2011  . Diabetes mellitus type II     steroid related  . Depression   . Sarcoid     including hand per rheumatology-Dr. Dierdre Forth  . Allergic rhinitis, cause unspecified   . Varicose veins with pain   . Positive ANA (antinuclear antibody) 02/14/2012   Past Surgical History  Procedure Date  . Uterine ablation 03/2010  . Wisdom tooth extraction     reports that she quit smoking about 2 years ago. Her smoking use included Cigarettes. She has a 20 pack-year smoking history. She does not have any smokeless tobacco history on file. She reports that she does not drink alcohol or use illicit drugs. family history includes Allergies in her mother; Bone cancer in her maternal aunt; Breast cancer in her maternal aunt; Diabetes in an unspecified family member; Heart attack in her mother; Heart disease in her father; Hypertension in an unspecified family member; Lung cancer in her maternal aunt; Ovarian cancer in her  maternal aunt; and Rheum arthritis in her father.  There is no history of Other. Allergies  Allergen Reactions  . Azithromycin     (z pak) hives  . Oxycodone-Acetaminophen     REACTION: difficulty breathing  . Penicillins     REACTION: swelling and difficulty breathing   Current Outpatient Prescriptions on File Prior to Visit  Medication Sig Dispense Refill  . acetaminophen (TYLENOL) 500 MG tablet Take 1,000 mg by mouth every 6 (six) hours as needed. For pain      . ALPRAZolam (XANAX) 0.5 MG tablet Take 0.5 mg by mouth 3 (three) times daily as needed. For anxiety      . B-D UF III MINI PEN NEEDLES 31G X 5 MM MISC USE FOUR TIMES DAILY AS NEEDED PER SLIDING SCALE  200 each  11  . benzonatate (TESSALON) 100 MG capsule Take 100-200 mg by mouth 3 (three) times daily as needed. For cough      . budesonide-formoterol (SYMBICORT) 160-4.5 MCG/ACT inhaler Inhale 2 puffs into the lungs 2 (two) times daily.      . CELEBREX 200 MG capsule Take 200 mg by mouth daily as needed. For pain      . cetirizine (ZYRTEC) 10 MG tablet Take 10 mg by mouth daily as needed. For allergies      . cyclobenzaprine (FLEXERIL) 5 MG tablet Take 5 mg by mouth daily as needed. For spasms      . fluticasone (FLONASE) 50 MCG/ACT nasal  spray Place 2 sprays into the nose daily as needed. For allergies      . folic acid (FOLVITE) 1 MG tablet Take 1 mg by mouth daily.      . furosemide (LASIX) 20 MG tablet Take 20 mg by mouth daily as needed. For fluid retention      . HYDROmorphone (DILAUDID) 2 MG tablet Take 1-2 mg by mouth every 4 (four) hours as needed. For pain      . insulin aspart (NOVOLOG FLEXPEN) 100 UNIT/ML injection Inject 0-4 Units into the skin 3 (three) times daily before meals. Based on sliding scale.  Pt checks blood sugars 6 times per day but typically only has to cover with novolog insulin1 time per day.      . levalbuterol (XOPENEX HFA) 45 MCG/ACT inhaler Inhale 2 puffs into the lungs 2 (two) times daily.      .  methotrexate (RHEUMATREX) 2.5 MG tablet Take 10 mg by mouth every Friday. Caution:Chemotherapy. Protect from light.      Marland Kitchen omeprazole (PRILOSEC) 20 MG capsule Take 20 mg by mouth daily.      . ONE TOUCH ULTRA TEST test strip USE TO TEST FOUR TIMES DAILY  200 each  11  . ONETOUCH DELICA LANCETS MISC USE TO TEST FOUR TIMES DAILY  200 each  11  . predniSONE (DELTASONE) 5 MG tablet Take 5 mg by mouth daily.      . sertraline (ZOLOFT) 50 MG tablet Take 50 mg by mouth daily.      . sitaGLIPtan-metformin (JANUMET) 50-500 MG per tablet Take 1 tablet by mouth daily.       . SUMAtriptan (IMITREX) 100 MG tablet Take 100 mg by mouth every 2 (two) hours as needed. For migraines      . traMADol (ULTRAM) 50 MG tablet Take 50 mg by mouth every 6 (six) hours as needed. For pain; Maximum dose= 8 tablets per day      . zolpidem (AMBIEN CR) 12.5 MG CR tablet Take 12.5 mg by mouth at bedtime.      . promethazine (PHENERGAN) 12.5 MG tablet Take 1 tablet (12.5 mg total) by mouth every 6 (six) hours as needed for nausea.  20 tablet  0  . promethazine (PHENERGAN) 25 MG tablet Take 1 tablet (25 mg total) by mouth every 6 (six) hours as needed for nausea.  30 tablet  0   Review of Systems All otherwise neg per pt    Objective:   Physical Exam BP 122/86  Pulse 90  Temp 98.2 F (36.8 C) (Oral)  Ht 5\' 7"  (1.702 m)  Wt 350 lb 2 oz (158.816 kg)  BMI 54.84 kg/m2  SpO2 97% Physical Exam  VS noted, mild ill Constitutional: Pt appears well-developed and well-nourished.  HENT: Head: Normocephalic.  Right Ear: External ear normal.  Left Ear: External ear normal.  Bilat tm's mild erythema.  Sinus nontender.  Pharynx mild erythema Eyes: Conjunctivae and EOM are normal. Pupils are equal, round, and reactive to light.  Neck: Normal range of motion. Neck supple.  Cardiovascular: Normal rate and regular rhythm.   Pulmonary/Chest: Effort normal and breath sounds normal.  Abd:  Soft, non-distended, + BS, but with diffuse mild  tender Neurological: Pt is alert. Not confused  Skin: Skin is warm. No erythema.  Psychiatric: Pt behavior is normal. Thought content normal.     Assessment & Plan:

## 2012-07-15 ENCOUNTER — Encounter: Payer: Self-pay | Admitting: Internal Medicine

## 2012-07-15 NOTE — Assessment & Plan Note (Signed)
stable overall by hx and exam, most recent data reviewed with pt, and pt to continue medical treatment as before Lab Results  Component Value Date   WBC 8.0 06/27/2012   HGB 12.6 06/27/2012   HCT 38.9 06/27/2012   PLT 299.0 06/27/2012   GLUCOSE 119* 06/27/2012   CHOL 163 06/27/2012   TRIG 111.0 06/27/2012   HDL 43.30 06/27/2012   LDLDIRECT 135.0 07/05/2011   LDLCALC 98 06/27/2012   ALT 20 06/27/2012   AST 19 06/27/2012   NA 139 06/27/2012   K 3.6 06/27/2012   CL 104 06/27/2012   CREATININE 0.9 06/27/2012   BUN 10 06/27/2012   CO2 25 06/27/2012   TSH 1.55 06/27/2012   INR 1.34 05/13/2010   HGBA1C 6.8* 06/27/2012   MICROALBUR 0.5 07/05/2012

## 2012-07-15 NOTE — Assessment & Plan Note (Signed)
stable overall by hx and exam, most recent data reviewed with pt, and pt to continue medical treatment as before Lab Results  Component Value Date   HGBA1C 6.8* 06/27/2012

## 2012-07-15 NOTE — Assessment & Plan Note (Signed)
C/w prob viral illness, for anti-emetic/lomotil, stool tests, hold antibx for now

## 2012-07-19 ENCOUNTER — Encounter (HOSPITAL_COMMUNITY)
Admission: RE | Admit: 2012-07-19 | Discharge: 2012-07-19 | Disposition: A | Discharge status: Home / Self Care | Payer: 59 | Source: Ambulatory Visit | Attending: Obstetrics and Gynecology | Admitting: Obstetrics and Gynecology

## 2012-07-19 ENCOUNTER — Other Ambulatory Visit: Payer: Self-pay

## 2012-07-19 ENCOUNTER — Encounter (HOSPITAL_COMMUNITY): Payer: Self-pay

## 2012-07-19 HISTORY — DX: Sarcoidosis of lung: D86.0

## 2012-07-19 HISTORY — DX: Shortness of breath: R06.02

## 2012-07-19 HISTORY — DX: Personal history of other medical treatment: Z92.89

## 2012-07-19 LAB — CBC
HCT: 36.9 % (ref 36.0–46.0)
MCHC: 32.8 g/dL (ref 30.0–36.0)
MCV: 80.2 fL (ref 78.0–100.0)
RDW: 15.6 % — ABNORMAL HIGH (ref 11.5–15.5)
WBC: 8 10*3/uL (ref 4.0–10.5)

## 2012-07-19 LAB — COMPREHENSIVE METABOLIC PANEL
Albumin: 3.9 g/dL (ref 3.5–5.2)
BUN: 8 mg/dL (ref 6–23)
Chloride: 99 mEq/L (ref 96–112)
Creatinine, Ser: 0.76 mg/dL (ref 0.50–1.10)
GFR calc Af Amer: >90 mL/min (ref >90)
Glucose, Bld: 115 mg/dL — ABNORMAL HIGH (ref 70–99)
Total Bilirubin: 0.2 mg/dL — ABNORMAL LOW (ref 0.3–1.2)

## 2012-07-19 LAB — SURGICAL PCR SCREEN
MRSA, PCR: NEGATIVE
Staphylococcus aureus: NEGATIVE

## 2012-07-19 MED ORDER — GENTAMICIN SULFATE 40 MG/ML IJ SOLN
INTRAVENOUS | Status: AC
  Administered 2012-07-20: 100 mL via INTRAVENOUS
  Filled 2012-07-19: qty 12.56

## 2012-07-19 NOTE — Anesthesia Preprocedure Evaluation (Addendum)
Anesthesia Evaluation  Patient identified by MRN, date of birth, ID band Patient awake    Reviewed: Allergy & Precautions, H&P , Patient's Chart, lab work & pertinent test results, reviewed documented beta blocker date and time   Airway Mallampati: III TM Distance: >3 FB Neck ROM: full    Dental No notable dental hx.    Pulmonary shortness of breath and with exertion,  breath sounds clear to auscultation  Pulmonary exam normal       Cardiovascular Rhythm:regular Rate:Normal     Neuro/Psych    GI/Hepatic GERD-  Medicated,  Endo/Other  diabetes, Well Controlled, Type obesity  Renal/GU      Musculoskeletal   Abdominal   Peds  Hematology   Anesthesia Other Findings 1) MO: pt gained 95 lbs since last GA at [facility]. 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 Takes Prednisone for yrs. We will add Methyl-pred in am, and could expect difficulty ventilating her  with her MO,sarcoidosis, and trendelenburg for procedure. RA sat is 95%, but pt does have impaired diffusion on PFT's.  I have mentioned this to Dr Billy [place] 3)DM: am FBs typically on 70"s. No insulin. Today it is 121 4) GERD she took meds this am  Reproductive/Obstetrics                        Anesthesia Physical Anesthesia Plan  ASA: III  Anesthesia Plan: General   Post-op Pain Management:    Induction: Intravenous  Airway Management Planned: Oral ETT and Video Laryngoscope Planned  Additional Equipment:   Intra-op Plan:   Post-operative Plan:   Informed Consent: I have reviewed the patients History and Physical, chart, labs and discussed the procedure including the risks, benefits and alternatives for the proposed anesthesia with the patient or authorized representative who has indicated his/her understanding and  acceptance.   Dental Advisory Given and Dental advisory given  Plan Discussed with: CRNA and Surgeon  Anesthesia Plan Comments: (  Discussed  general anesthesia, including possible nausea, instrumentation of airway, sore throat,pulmonary aspiration, etc. I asked if the were any outstanding questions, or  concerns before we proceeded. )        Anesthesia Quick Evaluation

## 2012-07-19 NOTE — Patient Instructions (Addendum)
Your procedure is scheduled on:07/20/12  Enter through the Main Entrance at :6am  Pick up desk phone and dial [phone] and inform us of your arrival.  Please call [phone] if you have any problems the morning of surgery.  Remember: Do not eat after midnight: tonight  Do not drink after:3:30 am Thursday- water only  Take these meds the morning of surgery with a sip of water:Prednisone, xanax, Omeprazole, Sertraline Bring inhalers to hospital  DO NOT wear jewelry, eye make-up, lipstick,body lotion, or dark fingernail polish. Do not shave for 48 hours prior to surgery.  If you are to be admitted after surgery, leave suitcase in car until your room has been assigned. Patients discharged on the day of surgery will not be allowed to drive home.   Remember to use your Hibiclens as instructed.

## 2012-07-20 ENCOUNTER — Ambulatory Visit (HOSPITAL_COMMUNITY)
Admission: RE | Admit: 2012-07-20 | Discharge: 2012-07-21 | Disposition: A | Discharge status: Home / Self Care | Payer: 59 | Source: Ambulatory Visit | Attending: Obstetrics and Gynecology | Admitting: Obstetrics and Gynecology

## 2012-07-20 ENCOUNTER — Encounter (HOSPITAL_COMMUNITY): Payer: Self-pay | Admitting: *Deleted

## 2012-07-20 ENCOUNTER — Encounter (HOSPITAL_COMMUNITY): Payer: Self-pay | Admitting: Anesthesiology

## 2012-07-20 ENCOUNTER — Ambulatory Visit (HOSPITAL_COMMUNITY): Payer: 59 | Admitting: Anesthesiology

## 2012-07-20 ENCOUNTER — Encounter (HOSPITAL_COMMUNITY)
Admission: RE | Disposition: A | Discharge status: Home / Self Care | Payer: Self-pay | Source: Ambulatory Visit | Attending: Obstetrics and Gynecology

## 2012-07-20 DIAGNOSIS — D259 Leiomyoma of uterus, unspecified: Secondary | ICD-10-CM | POA: Insufficient documentation

## 2012-07-20 DIAGNOSIS — Z01812 Encounter for preprocedural laboratory examination: Secondary | ICD-10-CM | POA: Insufficient documentation

## 2012-07-20 DIAGNOSIS — N815 Vaginal enterocele: Secondary | ICD-10-CM | POA: Insufficient documentation

## 2012-07-20 DIAGNOSIS — Z01818 Encounter for other preprocedural examination: Secondary | ICD-10-CM | POA: Insufficient documentation

## 2012-07-20 DIAGNOSIS — N838 Other noninflammatory disorders of ovary, fallopian tube and broad ligament: Secondary | ICD-10-CM | POA: Insufficient documentation

## 2012-07-20 DIAGNOSIS — N946 Dysmenorrhea, unspecified: Secondary | ICD-10-CM | POA: Insufficient documentation

## 2012-07-20 LAB — GLUCOSE, CAPILLARY
Glucose-Capillary: 179 mg/dL — ABNORMAL HIGH (ref 70–99)
Glucose-Capillary: 145 mg/dL — ABNORMAL HIGH (ref 70–99)

## 2012-07-20 SURGERY — ROBOTIC ASSISTED TOTAL HYSTERECTOMY
Anesthesia: General | Site: Abdomen | Wound class: Clean Contaminated

## 2012-07-20 MED ORDER — GLUCOSE BLOOD VI STRP: 1.0000 | ORAL_STRIP | Status: DC | PRN

## 2012-07-20 MED ORDER — ROCURONIUM BROMIDE 50 MG/5ML IV SOLN
INTRAVENOUS | Status: AC
  Filled 2012-07-20: qty 1

## 2012-07-20 MED ORDER — HYDROMORPHONE HCL PF 1 MG/ML IJ SOLN
INTRAMUSCULAR | Status: AC
  Administered 2012-07-20: 0.5 mg via INTRAVENOUS
  Filled 2012-07-20: qty 1

## 2012-07-20 MED ORDER — ONDANSETRON HCL 4 MG/2ML IJ SOLN: 4.0000 mg | Freq: Four times a day (QID) | INTRAMUSCULAR | Status: DC | PRN

## 2012-07-20 MED ORDER — ROCURONIUM BROMIDE 100 MG/10ML IV SOLN
INTRAVENOUS | Status: DC | PRN
  Administered 2012-07-20: 10 mg via INTRAVENOUS
  Administered 2012-07-20: 20 mg via INTRAVENOUS
  Administered 2012-07-20: 10 mg via INTRAVENOUS
  Administered 2012-07-20: 5 mg via INTRAVENOUS
  Administered 2012-07-20: 40 mg via INTRAVENOUS

## 2012-07-20 MED ORDER — NEOSTIGMINE METHYLSULFATE 1 MG/ML IJ SOLN
INTRAMUSCULAR | Status: AC
  Filled 2012-07-20: qty 10

## 2012-07-20 MED ORDER — INFLUENZA VIRUS VACC SPLIT PF IM SUSP: 0.5000 mL | INTRAMUSCULAR | Status: DC

## 2012-07-20 MED ORDER — FENTANYL CITRATE 0.05 MG/ML IJ SOLN
INTRAMUSCULAR | Status: DC | PRN
  Administered 2012-07-20 (×2): 50 ug via INTRAVENOUS
  Administered 2012-07-20: 100 ug via INTRAVENOUS
  Administered 2012-07-20: 50 ug via INTRAVENOUS

## 2012-07-20 MED ORDER — LEVALBUTEROL TARTRATE 45 MCG/ACT IN AERO
2.0000 | INHALATION_SPRAY | Freq: Two times a day (BID) | RESPIRATORY_TRACT | Status: DC
  Administered 2012-07-20: 2 via RESPIRATORY_TRACT

## 2012-07-20 MED ORDER — BUPIVACAINE HCL (PF) 0.25 % IJ SOLN
INTRAMUSCULAR | Status: DC | PRN
  Administered 2012-07-20: 18 mL

## 2012-07-20 MED ORDER — HYDROMORPHONE HCL PF 1 MG/ML IJ SOLN
INTRAMUSCULAR | Status: DC | PRN
  Administered 2012-07-20: 1 mg via INTRAVENOUS

## 2012-07-20 MED ORDER — NALOXONE HCL 0.4 MG/ML IJ SOLN: 0.4000 mg | INTRAMUSCULAR | Status: DC | PRN

## 2012-07-20 MED ORDER — FLUTICASONE PROPIONATE 50 MCG/ACT NA SUSP: 2.0000 | Freq: Every day | NASAL | Status: DC | PRN

## 2012-07-20 MED ORDER — HYDROMORPHONE HCL PF 1 MG/ML IJ SOLN
0.2500 mg | INTRAMUSCULAR | Status: DC | PRN
  Administered 2012-07-20 (×4): 0.5 mg via INTRAVENOUS

## 2012-07-20 MED ORDER — DIPHENHYDRAMINE HCL 50 MG/ML IJ SOLN: 12.5000 mg | Freq: Four times a day (QID) | INTRAMUSCULAR | Status: DC | PRN

## 2012-07-20 MED ORDER — SUCCINYLCHOLINE CHLORIDE 20 MG/ML IJ SOLN
INTRAMUSCULAR | Status: AC
  Filled 2012-07-20: qty 10

## 2012-07-20 MED ORDER — GLYCOPYRROLATE 0.2 MG/ML IJ SOLN
INTRAMUSCULAR | Status: AC
  Filled 2012-07-20: qty 1

## 2012-07-20 MED ORDER — MENTHOL 3 MG MT LOZG
1.0000 | LOZENGE | OROMUCOSAL | Status: DC | PRN
  Filled 2012-07-20: qty 9

## 2012-07-20 MED ORDER — PROPOFOL 10 MG/ML IV EMUL
INTRAVENOUS | Status: AC
  Filled 2012-07-20: qty 40

## 2012-07-20 MED ORDER — SERTRALINE HCL 50 MG PO TABS
50.0000 mg | ORAL_TABLET | Freq: Every day | ORAL | Status: DC
  Filled 2012-07-20: qty 1

## 2012-07-20 MED ORDER — DIPHENHYDRAMINE HCL 12.5 MG/5ML PO ELIX: 12.5000 mg | ORAL_SOLUTION | Freq: Four times a day (QID) | ORAL | Status: DC | PRN

## 2012-07-20 MED ORDER — PHENYLEPHRINE HCL 10 MG/ML IJ SOLN
INTRAMUSCULAR | Status: DC | PRN
  Administered 2012-07-20 (×3): 80 ug via INTRAVENOUS

## 2012-07-20 MED ORDER — SODIUM CHLORIDE 0.9 % IJ SOLN: 9.0000 mL | INTRAMUSCULAR | Status: DC | PRN

## 2012-07-20 MED ORDER — METHOTREXATE 2.5 MG PO TABS: 10.0000 mg | ORAL_TABLET | ORAL | Status: DC

## 2012-07-20 MED ORDER — FENTANYL CITRATE 0.05 MG/ML IJ SOLN
INTRAMUSCULAR | Status: AC
Start: 2012-07-20 — End: 2012-07-20
  Filled 2012-07-20: qty 5

## 2012-07-20 MED ORDER — ARTIFICIAL TEARS OP OINT
TOPICAL_OINTMENT | OPHTHALMIC | Status: AC
  Filled 2012-07-20: qty 3.5

## 2012-07-20 MED ORDER — KETOROLAC TROMETHAMINE 30 MG/ML IJ SOLN
INTRAMUSCULAR | Status: AC
  Filled 2012-07-20: qty 1

## 2012-07-20 MED ORDER — MIDAZOLAM HCL 5 MG/5ML IJ SOLN
INTRAMUSCULAR | Status: DC | PRN
  Administered 2012-07-20 (×2): 1 mg via INTRAVENOUS

## 2012-07-20 MED ORDER — FUROSEMIDE 20 MG PO TABS
20.0000 mg | ORAL_TABLET | Freq: Every day | ORAL | Status: DC | PRN
  Filled 2012-07-20: qty 1

## 2012-07-20 MED ORDER — TRAMADOL HCL 50 MG PO TABS
50.0000 mg | ORAL_TABLET | Freq: Four times a day (QID) | ORAL | Status: DC | PRN
  Administered 2012-07-21: 50 mg via ORAL
  Filled 2012-07-20: qty 1

## 2012-07-20 MED ORDER — SODIUM CHLORIDE 0.9 % IJ SOLN
INTRAMUSCULAR | Status: DC | PRN
  Administered 2012-07-20: 10 mL

## 2012-07-20 MED ORDER — MIDAZOLAM HCL 5 MG/5ML IJ SOLN: INTRAMUSCULAR | Status: DC | PRN

## 2012-07-20 MED ORDER — LACTATED RINGERS IV SOLN
INTRAVENOUS | Status: DC
  Administered 2012-07-20 – 2012-07-21 (×2): via INTRAVENOUS

## 2012-07-20 MED ORDER — PREDNISONE 5 MG PO TABS
5.0000 mg | ORAL_TABLET | Freq: Every day | ORAL | Status: DC
  Filled 2012-07-20 (×2): qty 1

## 2012-07-20 MED ORDER — LACTATED RINGERS IV SOLN
INTRAVENOUS | Status: DC
  Administered 2012-07-20 (×2): via INTRAVENOUS

## 2012-07-20 MED ORDER — HYDROMORPHONE 0.3 MG/ML IV SOLN
INTRAVENOUS | Status: DC
  Administered 2012-07-20: 13:00:00 via INTRAVENOUS
  Administered 2012-07-20 (×2): 1.2 mg via INTRAVENOUS
  Administered 2012-07-20: 1.5 mg via INTRAVENOUS
  Administered 2012-07-21 (×2): 0.6 mg via INTRAVENOUS
  Filled 2012-07-20: qty 25

## 2012-07-20 MED ORDER — NEOSTIGMINE METHYLSULFATE 1 MG/ML IJ SOLN
INTRAMUSCULAR | Status: DC | PRN
  Administered 2012-07-20: 3 mg via INTRAVENOUS

## 2012-07-20 MED ORDER — BUPIVACAINE HCL (PF) 0.25 % IJ SOLN
INTRAMUSCULAR | Status: AC
  Filled 2012-07-20: qty 30

## 2012-07-20 MED ORDER — ONDANSETRON HCL 4 MG/2ML IJ SOLN
INTRAMUSCULAR | Status: AC
  Filled 2012-07-20: qty 2

## 2012-07-20 MED ORDER — HYDROMORPHONE HCL PF 1 MG/ML IJ SOLN
INTRAMUSCULAR | Status: AC
  Filled 2012-07-20: qty 1

## 2012-07-20 MED ORDER — HYDROMORPHONE HCL 2 MG PO TABS
1.0000 mg | ORAL_TABLET | ORAL | Status: DC | PRN
  Administered 2012-07-21 (×2): 2 mg via ORAL
  Filled 2012-07-20 (×2): qty 1

## 2012-07-20 MED ORDER — GLYCOPYRROLATE 0.2 MG/ML IJ SOLN
INTRAMUSCULAR | Status: AC
  Filled 2012-07-20: qty 2

## 2012-07-20 MED ORDER — ZOLPIDEM TARTRATE 5 MG PO TABS: 5.0000 mg | ORAL_TABLET | Freq: Every evening | ORAL | Status: DC | PRN

## 2012-07-20 MED ORDER — LIDOCAINE HCL (CARDIAC) 20 MG/ML IV SOLN
INTRAVENOUS | Status: DC | PRN
  Administered 2012-07-20: 80 mg via INTRAVENOUS

## 2012-07-20 MED ORDER — PROPOFOL 10 MG/ML IV EMUL
INTRAVENOUS | Status: DC | PRN
  Administered 2012-07-20: 40 mg via INTRAVENOUS
  Administered 2012-07-20: 100 mg via INTRAVENOUS
  Administered 2012-07-20: 250 mg via INTRAVENOUS

## 2012-07-20 MED ORDER — BUDESONIDE-FORMOTEROL FUMARATE 160-4.5 MCG/ACT IN AERO
2.0000 | INHALATION_SPRAY | Freq: Two times a day (BID) | RESPIRATORY_TRACT | Status: DC
  Administered 2012-07-20: 2 via RESPIRATORY_TRACT

## 2012-07-20 MED ORDER — MIDAZOLAM HCL 2 MG/2ML IJ SOLN
INTRAMUSCULAR | Status: AC
  Filled 2012-07-20: qty 2

## 2012-07-20 MED ORDER — GLYCOPYRROLATE 0.2 MG/ML IJ SOLN
INTRAMUSCULAR | Status: DC | PRN
  Administered 2012-07-20: 0.4 mg via INTRAVENOUS

## 2012-07-20 MED ORDER — MUPIROCIN 2 % EX OINT: TOPICAL_OINTMENT | Freq: Two times a day (BID) | CUTANEOUS | Status: DC

## 2012-07-20 MED ORDER — INSULIN ASPART 100 UNIT/ML ~~LOC~~ SOLN
0.0000 [IU] | Freq: Three times a day (TID) | SUBCUTANEOUS | Status: DC
  Administered 2012-07-20: 2 [IU] via SUBCUTANEOUS

## 2012-07-20 MED ORDER — LIDOCAINE HCL (CARDIAC) 20 MG/ML IV SOLN
INTRAVENOUS | Status: AC
  Filled 2012-07-20: qty 5

## 2012-07-20 MED ORDER — ONDANSETRON HCL 4 MG/2ML IJ SOLN
INTRAMUSCULAR | Status: DC | PRN
  Administered 2012-07-20: 4 mg via INTRAVENOUS

## 2012-07-20 MED ORDER — METHYLPREDNISOLONE SODIUM SUCC 125 MG IJ SOLR
125.0000 mg | Freq: Once | INTRAMUSCULAR | Status: AC
  Administered 2012-07-20: 125 mg via INTRAVENOUS
  Filled 2012-07-20: qty 2

## 2012-07-20 MED ORDER — SUCCINYLCHOLINE CHLORIDE 20 MG/ML IJ SOLN
INTRAMUSCULAR | Status: DC | PRN
  Administered 2012-07-20: 100 mg via INTRAVENOUS

## 2012-07-20 SURGICAL SUPPLY — 61 items
BAG URINE DRAINAGE (UROLOGICAL SUPPLIES) ×3 IMPLANT
BARRIER ADHS 3X4 INTERCEED (GAUZE/BANDAGES/DRESSINGS) IMPLANT
BLADE MORCELLATOR EXT  12.5X15 (ELECTROSURGICAL) ×1
BLADE MORCELLATOR EXT 12.5X15 (ELECTROSURGICAL) ×2 IMPLANT
CABLE HIGH FREQUENCY MONO STRZ (ELECTRODE) ×3 IMPLANT
CATH FOLEY 3WAY  5CC 16FR (CATHETERS) ×1
CATH FOLEY 3WAY 5CC 16FR (CATHETERS) ×2 IMPLANT
CHLORAPREP W/TINT 26ML (MISCELLANEOUS) ×3 IMPLANT
CLOTH BEACON ORANGE TIMEOUT ST (SAFETY) ×3 IMPLANT
CONT PATH 16OZ SNAP LID 3702 (MISCELLANEOUS) ×3 IMPLANT
COVER MAYO STAND STRL (DRAPES) ×3 IMPLANT
COVER TABLE BACK 60X90 (DRAPES) ×6 IMPLANT
COVER TIP SHEARS 8 DVNC (MISCELLANEOUS) ×2 IMPLANT
COVER TIP SHEARS 8MM DA VINCI (MISCELLANEOUS) ×1
DECANTER SPIKE VIAL GLASS SM (MISCELLANEOUS) ×3 IMPLANT
DERMABOND ADVANCED (GAUZE/BANDAGES/DRESSINGS) ×1
DERMABOND ADVANCED .7 DNX12 (GAUZE/BANDAGES/DRESSINGS) ×2 IMPLANT
DRAPE HUG U DISPOSABLE (DRAPE) ×3 IMPLANT
DRAPE LG THREE QUARTER DISP (DRAPES) ×6 IMPLANT
DRAPE ROBOTICS STRL (DRAPES) ×3 IMPLANT
DRAPE WARM FLUID 44X44 (DRAPE) ×3 IMPLANT
ELECT REM PT RETURN 9FT ADLT (ELECTROSURGICAL) ×3
ELECTRODE REM PT RTRN 9FT ADLT (ELECTROSURGICAL) ×2 IMPLANT
GAUZE PACKING 2X5 YD STERILE (GAUZE/BANDAGES/DRESSINGS) ×3 IMPLANT
GLOVE BIO SURGEON STRL SZ7 (GLOVE) ×3 IMPLANT
GLOVE BIO SURGEON STRL SZ7.5 (GLOVE) ×9 IMPLANT
GLOVE BIOGEL PI IND STRL 7.0 (GLOVE) ×2 IMPLANT
GLOVE BIOGEL PI INDICATOR 7.0 (GLOVE) ×1
GOWN STRL REIN XL XLG (GOWN DISPOSABLE) ×18 IMPLANT
GYRUS RUMI II 2.5CM BLUE (DISPOSABLE) ×3
KIT ACCESSORY DA VINCI DISP (KITS) ×1
KIT ACCESSORY DVNC DISP (KITS) ×2 IMPLANT
NEEDLE INSUFFLATION 14GA 150MM (NEEDLE) ×3 IMPLANT
PACK LAVH (CUSTOM PROCEDURE TRAY) ×3 IMPLANT
PAD PREP 24X48 CUFFED NSTRL (MISCELLANEOUS) ×6 IMPLANT
PLUG CATH AND CAP STER (CATHETERS) ×3 IMPLANT
PROTECTOR NERVE ULNAR (MISCELLANEOUS) ×6 IMPLANT
RUMI II GYRUS 2.5CM BLUE (DISPOSABLE) ×2 IMPLANT
SET CYSTO W/LG BORE CLAMP LF (SET/KITS/TRAYS/PACK) ×3 IMPLANT
SET IRRIG TUBING LAPAROSCOPIC (IRRIGATION / IRRIGATOR) ×3 IMPLANT
SLEEVE SCD COMPRESS KNEE MED (MISCELLANEOUS) ×3 IMPLANT
SOLUTION ELECTROLUBE (MISCELLANEOUS) ×3 IMPLANT
SUT VIC AB 0 CT1 27 (SUTURE) ×2
SUT VIC AB 0 CT1 27XBRD ANBCTR (SUTURE) ×4 IMPLANT
SUT VICRYL 0 UR6 27IN ABS (SUTURE) ×3 IMPLANT
SUT VICRYL RAPIDE 4/0 PS 2 (SUTURE) ×6 IMPLANT
SUT VLOC 180 0 9IN  GS21 (SUTURE)
SUT VLOC 180 0 9IN GS21 (SUTURE) IMPLANT
SYR 50ML LL SCALE MARK (SYRINGE) ×3 IMPLANT
SYRINGE 10CC LL (SYRINGE) ×3 IMPLANT
TIP UTERINE 6.7X8CM BLUE DISP (MISCELLANEOUS) ×3 IMPLANT
TOWEL OR 17X24 6PK STRL BLUE (TOWEL DISPOSABLE) ×9 IMPLANT
TROCAR 5M 150ML BLDLS (TROCAR) ×3 IMPLANT
TROCAR DISP BLADELESS 8 DVNC (TROCAR) ×2 IMPLANT
TROCAR DISP BLADELESS 8MM (TROCAR) ×1
TROCAR XCEL NON-BLD 11X100MML (ENDOMECHANICALS) ×3 IMPLANT
TROCAR XCEL NON-BLD 5MMX100MML (ENDOMECHANICALS) ×3 IMPLANT
TROCAR Z-THREAD 12X150 (TROCAR) ×3 IMPLANT
TUBING FILTER THERMOFLATOR (ELECTROSURGICAL) ×3 IMPLANT
WARMER LAPAROSCOPE (MISCELLANEOUS) ×3 IMPLANT
WATER STERILE IRR 1000ML POUR (IV SOLUTION) ×9 IMPLANT

## 2012-07-20 NOTE — Anesthesia Postprocedure Evaluation (Signed)
  Anesthesia Post-op Note  Patient: Nicole Melendez  Procedure(s) Performed: Procedure(s) (LRB) with comments: ROBOTIC ASSISTED TOTAL HYSTERECTOMY (N/A) BILATERAL SALPINGECTOMY (Bilateral) Patient is awake and responsive. Pain and nausea are reasonably well controlled. Vital signs are stable and clinically acceptable. Oxygen saturation is clinically acceptable. There are no apparent anesthetic complications at this time. Patient is ready for discharge with Oxygen to floor

## 2012-07-20 NOTE — H&P (Signed)
NAME:  Nicole Melendez, Nicole Melendez               ACCOUNT NO.:  [id number]  MEDICAL RECORD NO.:  [id number]  LOCATION:  SDC                           FACILITY:  [facility]  PHYSICIAN:  Lenoard Aden, M.D.DATE OF BIRTH:  1976-08-15  DATE OF ADMISSION:  07/19/2012 DATE OF DISCHARGE:                             HISTORY & PHYSICAL   CHIEF COMPLAINT:  Refractory menorrhagia and dysmenorrhea.  HISTORY OF PRESENT ILLNESS:  She is a 36 year old African American female G0, P0 status post hysteroscopy, resection of fibroid with HTA endometrial ablation in 2011, with persistent dysmenorrhea and menorrhagia for definitive therapy.  PAST MEDICAL HISTORY:  Remarkable for morbid obesity, menorrhagia, diabetes mellitus, and sarcoidosis.  ALLERGIES:  PENICILLIN, Z-PAK, and PERCOCET ANALGESIA.  SOCIAL HISTORY:  She is a nonsmoker, nondrinker.  She denies domestic or physical violence.  FAMILY HISTORY:  Hypertension, heart disease, diabetes, ovarian cancer, cervical cancer, and breast cancer.  MEDICATIONS: 1. Pepcid. 2. Janumet. 3. NovoLog. 4. Celebrex. 5. Tramadol. 6. Prednisone. 7. Methotrexate. 8. Ambien. 9. Flexeril. 10.Folic acid. 11.Hydromorphone as needed. 12.Xopenex. 13.Symbicort. 14.Xanax as needed.  PAST SURGICAL HISTORY:  As noted.  PHYSICAL EXAMINATION:  GENERAL:  She is an obese African American female, height is 66 inches, weight of 345 pounds. HEENT:  Normal. NECK:  Supple.  Full range of motion. LUNGS:  Clear. HEART:  Regular rhythm. ABDOMEN:  Soft, obese, and nontender. PELVIC:  Confirmed by pelvic ultrasound, reveals a bulky anteflexed uterus, and a questionable posterior pedunculated fibroid that is approximately 10 cm in diameter. EXTREMITIES:  No cords. NEUROLOGIC:  Nonfocal. SKIN:  Intact.  IMPRESSION: 1. Refractory dysmenorrhea, menorrhagia for definitive therapy. 2. Sarcoidosis with restrictive airway disease. 3. Diabetes mellitus. 4. Morbid obesity.  PLAN:   Proceed with da Vinci assisted TLH  with bilateral salpingectomy.  Risks of anesthesia, infection, bleeding, injury to abdominal organs, need for repair was discussed.  Delayed versus immediate complications to include bowel and bladder injury noted.  Upon discussion with anesthesia and with the patient today due to her restrictive lung disease and inability to tolerate sustained deep Trendelenburg position. She might be candidate for minimally invasive surgery failure.  She acknowledges these risks and consents to an open procedure as needed.  She also consents to the possible need for blood product transfusion.     Lenoard Aden, M.D.     RJT/MEDQ  D:  07/19/2012  T:  07/19/2012  Job:  [id number]

## 2012-07-20 NOTE — Op Note (Signed)
07/20/2012  10:54 AM  PATIENT:  Nicole Melendez  36 y.o. female  PRE-OPERATIVE DIAGNOSIS:  Symptomatic Fibroids Morbid obesity Dysmenorrhea  POST-OPERATIVE DIAGNOSIS:  Symptomatic Fibroids Enterocele Pelvic Adhesions  PROCEDURE:  Procedure(s): ROBOTIC ASSISTED TOTAL HYSTERECTOMY BILATERAL SALPINGECTOMY MCCALL CULDEPLASTY LYSIS OF ADHESIONS REMOVAL OF BILATERAL PARATUBAL CYSTS MORCELLATION MYOMECTOMY  SURGEON:  Surgeon(s): Lenoard Aden, MD Robley Fries, MD  ASSISTANTS: MODY   ANESTHESIA:   local and general  ESTIMATED BLOOD LOSS: * No blood loss amount entered *   DRAINS: FOLEY  LOCAL MEDICATIONS USED:  MARCAINE     SPECIMEN:  Source of Specimen:  UTERUS, CERVIX, TUBES, PARATUBAL CYSTS  DISPOSITION OF SPECIMEN:  PATHOLOGY  COUNTS:  YES  DICTATION #: [id number]  PLAN OF CARE: DC IN AM  PATIENT DISPOSITION:  PACU - hemodynamically stable.

## 2012-07-20 NOTE — Transfer of Care (Signed)
Immediate Anesthesia Transfer of Care Note  Patient: Nicole Melendez  Procedure(s) Performed: Procedure(s) (LRB) with comments: ROBOTIC ASSISTED TOTAL HYSTERECTOMY (N/A) BILATERAL SALPINGECTOMY (Bilateral)  Patient Location: PACU  Anesthesia Type: General  Level of Consciousness: awake, alert  and oriented  Airway & Oxygen Therapy: Patient Spontanous Breathing and Patient connected to nasal cannula oxygen  Post-op Assessment: Report given to PACU RN and Post -op Vital signs reviewed and stable  Post vital signs: stable  Complications: No apparent anesthesia complications

## 2012-07-20 NOTE — Progress Notes (Signed)
Patient ID: Nicole Melendez, female   DOB: August 09, 1976, 36 y.o.   MRN: [id number] Patient seen and examined. Consent witnessed and signed. No changes noted. Update completed.

## 2012-07-20 NOTE — Anesthesia Postprocedure Evaluation (Signed)
  Anesthesia Post-op Note  Patient: Nicole Melendez  Procedure(s) Performed: Procedure(s) (LRB) with comments: ROBOTIC ASSISTED TOTAL HYSTERECTOMY (N/A) BILATERAL SALPINGECTOMY (Bilateral)  Patient Location: [facility]  Anesthesia Type: General  Level of Consciousness: awake  Airway and Oxygen Therapy: Patient Spontanous Breathing  Post-op Pain: mild  Post-op Assessment: Patient's Cardiovascular Status Stable and Respiratory Function Stable  Post-op Vital Signs: stable  Complications: No apparent anesthesia complications

## 2012-07-20 NOTE — Addendum Note (Signed)
Addendum  created 07/20/12 1716 by Renford Dills, CRNA   Modules edited:Notes Section

## 2012-07-21 LAB — CBC
HCT: 28.5 % — ABNORMAL LOW (ref 36.0–46.0)
Hemoglobin: 9.4 g/dL — ABNORMAL LOW (ref 12.0–15.0)
MCH: 26.6 pg (ref 26.0–34.0)
MCV: 80.5 fL (ref 78.0–100.0)
Platelets: 275 10*3/uL (ref 150–400)
RBC: 3.54 MIL/uL — ABNORMAL LOW (ref 3.87–5.11)
WBC: 11.4 10*3/uL — ABNORMAL HIGH (ref 4.0–10.5)

## 2012-07-21 LAB — COMPREHENSIVE METABOLIC PANEL
AST: 17 U/L (ref 0–37)
Albumin: 3.1 g/dL — ABNORMAL LOW (ref 3.5–5.2)
BUN: 7 mg/dL (ref 6–23)
Calcium: 9.1 mg/dL (ref 8.4–10.5)
Creatinine, Ser: 0.77 mg/dL (ref 0.50–1.10)
Total Protein: 6.2 g/dL (ref 6.0–8.3)

## 2012-07-21 LAB — GLUCOSE, CAPILLARY
Glucose-Capillary: 176 mg/dL — ABNORMAL HIGH (ref 70–99)
Glucose-Capillary: 189 mg/dL — ABNORMAL HIGH (ref 70–99)

## 2012-07-21 MED ORDER — HYDROMORPHONE HCL 2 MG PO TABS: 2.0000 mg | ORAL_TABLET | ORAL | Status: DC | PRN

## 2012-07-21 MED ORDER — INFLUENZA VIRUS VACC SPLIT PF IM SUSP
0.5000 mL | INTRAMUSCULAR | Status: AC
  Administered 2012-07-21: 0.5 mL via INTRAMUSCULAR
  Filled 2012-07-21: qty 0.5

## 2012-07-21 MED ORDER — TRAMADOL HCL 50 MG PO TABS: 50.0000 mg | ORAL_TABLET | Freq: Four times a day (QID) | ORAL | Status: DC | PRN

## 2012-07-21 NOTE — Progress Notes (Signed)
1 Day Post-Op Procedure(s) (LRB): ROBOTIC ASSISTED TOTAL HYSTERECTOMY (N/A) BILATERAL SALPINGECTOMY (Bilateral)  Subjective: Patient reports nausea, incisional pain, tolerating PO, + flatus and no problems voiding.    Objective: I have reviewed patient's vital signs, intake and output, medications and labs. BP 125/70  Pulse 81  Temp 98.2 F (36.8 C) (Oral)  Resp 18  Ht 5\' 7"  (1.702 m)  Wt 156.491 kg (345 lb)  BMI 54.03 kg/m2  SpO2 98%  CBC    Component Value Date/Time   WBC 11.4* 07/21/2012 0540   RBC 3.54* 07/21/2012 0540   HGB 9.4* 07/21/2012 0540   HCT 28.5* 07/21/2012 0540   PLT 275 07/21/2012 0540   MCV 80.5 07/21/2012 0540   MCH 26.6 07/21/2012 0540   MCHC 33.0 07/21/2012 0540   RDW 15.5 07/21/2012 0540   LYMPHSABS 0.6* 06/27/2012 0856   MONOABS 0.5 06/27/2012 0856   EOSABS 0.1 06/27/2012 0856   BASOSABS 0.0 06/27/2012 0856      General: alert, cooperative and appears stated age Resp: clear to auscultation bilaterally and normal percussion bilaterally Cardio: regular rate and rhythm, S1, S2 normal, no murmur, click, rub or gallop and normal apical impulse GI: soft, non-tender; bowel sounds normal; no masses,  no organomegaly and incision: clean, dry and intact Extremities: extremities normal, atraumatic, no cyanosis or edema and Homans sign is negative, no sign of DVT Vaginal Bleeding: minimal  Assessment: s/p Procedure(s) (LRB) with comments: ROBOTIC ASSISTED TOTAL HYSTERECTOMY (N/A) BILATERAL SALPINGECTOMY (Bilateral): stable, progressing well and tolerating diet  Plan: Advance diet Encourage ambulation Advance to PO medication Discharge home  LOS: 1 day    Tirth Cothron J 07/21/2012, 7:05 AM

## 2012-07-21 NOTE — Op Note (Signed)
NAME:  Nicole Melendez, Nicole Melendez               ACCOUNT NO.:  [id number]  MEDICAL RECORD NO.:  [id number]  LOCATION:  9316                          FACILITY:  [facility]  PHYSICIAN:  Lenoard Aden, M.D.DATE OF BIRTH:  1976/08/13  DATE OF PROCEDURE: DATE OF DISCHARGE:                              OPERATIVE REPORT   DESCRIPTION OF PROCEDURE:  After being apprised of risks of anesthesia, infection, bleeding, injury to abdominal organs, possible need for repair, the patient was brought to the operating room where she was administered general anesthetic without complications.  Prepped and draped in usual sterile fashion.  Foley catheter placed.  RUMI retractor placed per vagina.  Supraumbilical incision made without difficulty. Veress needle placed, opening pressure of 5 noted, 3 L of CO2 insufflated without difficulty.  Trocar placed atraumatically. Visualization revealed a markedly enlarged uterine fibroid which is on a thick stalk, pedunculated off the fundus of the uterus.  Bilateral normal ovaries.  Both tubes with large paratubal cyst.  At this time, robotic ports were made, 2 on the right, 1 on the left, and an additional 12 mm port on the left.  Replaced this as an Geophysicist/field seismologist port. The robot was then docked in a standard fashion after establishing deep Trendelenburg position which the patient tolerates well.  At this time after docking the robot, the ProGrasp PK forceps and Endoshears were placed.  Visualization revealed a markedly enlarged fibroid as noted. This fibroid was isolated, cauterized, and a myomectomy was performed removing the fibroid at its base.  The base of the fibroid was then cauterized for good hemostasis.  Both paratubal cysts were then sharply excised.  The mesosalpinx was identified and cauterized and divided. Both tubes were excised.  At this time retroperitoneal space was entered on the right side.  The ovarian ligament was skeletonized, divided.  The round ligament was  opened.  The ureter was noted.  The bladder flap was developed sharply.  On the left side, the retroperitoneal space was entered.  The tubo-ovarian ligament was divided, round ligament was divided, and the uterine vessels were skeletonized bilaterally after the bladder flap was developed sharply in anterior fashion palpating the RUMI cup between the exposed area.  At this time, the uterine vessels were cauterized bilaterally.  After cauterizing them on the left, they were divided.  The ureters were noted to be out of the field and peristalsing normally bilaterally.  The cup was then circumscribed using the Endoshears dividing the specimen which was then released into the abdomen with the fibroid and RUMI retractor was removed including cups placed.  The vagina was closed using a 0 V-Loc suture in a continuous running fashion and McCall culdoplasty suture was placed without difficulty.  Good hemostasis noted.  Ureters appeared normal.  Both ovaries appeared normal.  At this time, the robot was undocked and extensive morcellation was accomplished for specimen weighing 930 g. All fibroid fragments were noted and removed.  Irrigation was accomplished.  Good hemostasis was noted.  The trocar sites were removed and incisions were closed using 0 Vicryl, 4-0 Vicryl.  At this time, the attention was turned to the vaginal portion of the procedure where there was small amount  of bleeding, oozing noted from the vaginal cuff.  A vaginal packing was placed without difficulty.  The patient tolerated the procedure well.  Urine was clear and copious.  The patient was awakened and transferred to recovery room in good condition.    Lenoard Aden, M.D.    RJT/MEDQ  D:  07/20/2012  T:  07/21/2012  Job:  [id number]

## 2012-08-07 ENCOUNTER — Other Ambulatory Visit: Payer: Self-pay | Admitting: Internal Medicine

## 2012-08-14 ENCOUNTER — Other Ambulatory Visit (INDEPENDENT_AMBULATORY_CARE_PROVIDER_SITE_OTHER): Payer: 59

## 2012-08-14 DIAGNOSIS — D869 Sarcoidosis, unspecified: Secondary | ICD-10-CM

## 2012-08-14 LAB — HEPATIC FUNCTION PANEL
ALT: 26 U/L (ref 0–35)
AST: 24 U/L (ref 0–37)
Albumin: 3.4 g/dL — ABNORMAL LOW (ref 3.5–5.2)
Alkaline Phosphatase: 100 U/L (ref 39–117)
Total Protein: 7.2 g/dL (ref 6.0–8.3)

## 2012-08-14 LAB — CBC WITH DIFFERENTIAL/PLATELET
Basophils Relative: 0.6 % (ref 0.0–3.0)
Eosinophils Relative: 3.5 % (ref 0.0–5.0)
HCT: 35.2 % — ABNORMAL LOW (ref 36.0–46.0)
Hemoglobin: 11.2 g/dL — ABNORMAL LOW (ref 12.0–15.0)
Lymphocytes Relative: 8.5 % — ABNORMAL LOW (ref 12.0–46.0)
Lymphs Abs: 0.6 10*3/uL — ABNORMAL LOW (ref 0.7–4.0)
Monocytes Relative: 6.3 % (ref 3.0–12.0)
Neutro Abs: 6.1 10*3/uL (ref 1.4–7.7)
RBC: 4.41 Mil/uL (ref 3.87–5.11)

## 2012-08-15 ENCOUNTER — Telehealth: Payer: Self-pay | Admitting: Pulmonary Disease

## 2012-08-15 NOTE — Telephone Encounter (Signed)
Pt returned call. I advised her re:  f/u appt 08-18-12. Nothing further needed per pt and I will close this encounter. Hazel Sams

## 2012-08-15 NOTE — Telephone Encounter (Signed)
lmomtcb x1 for pt--looks like it was a reminder call advising pt of upcoming appt.

## 2012-08-16 ENCOUNTER — Other Ambulatory Visit: Payer: Self-pay

## 2012-08-16 MED ORDER — SITAGLIPTIN PHOS-METFORMIN HCL 50-500 MG PO TABS: 1.0000 | ORAL_TABLET | Freq: Two times a day (BID) | ORAL | Status: DC

## 2012-08-17 ENCOUNTER — Telehealth: Payer: Self-pay | Admitting: Pulmonary Disease

## 2012-08-17 NOTE — Telephone Encounter (Signed)
Notes Recorded by Barbaraann Share, MD on 08/14/2012 at 11:49 AM Please let pt know that her labwork is ok.  I spoke with patient about results and she verbalized understanding and had no questions

## 2012-08-18 ENCOUNTER — Ambulatory Visit (INDEPENDENT_AMBULATORY_CARE_PROVIDER_SITE_OTHER): Payer: 59 | Admitting: Pulmonary Disease

## 2012-08-18 ENCOUNTER — Encounter: Payer: Self-pay | Admitting: Pulmonary Disease

## 2012-08-18 VITALS — BP 132/82 | HR 89 | Temp 98.5°F | Ht 67.0 in | Wt 347.6 lb

## 2012-08-18 DIAGNOSIS — D869 Sarcoidosis, unspecified: Secondary | ICD-10-CM

## 2012-08-18 MED ORDER — NYSTATIN 100000 UNIT/ML MT SUSP: 4.0000 mL | Freq: Three times a day (TID) | OROMUCOSAL | Status: DC

## 2012-08-18 NOTE — Patient Instructions (Addendum)
No change in breathing meds Will get you nystatin mouth wash for your thrush. Work on weight loss.  This is the next step now that sarcoid is controlled.  followup with me in 6mos if doing well.

## 2012-08-18 NOTE — Assessment & Plan Note (Signed)
The patient is doing very well on her current medical regimen.  She is having no breathing issues or joint complaints at this time.  Her lab testing has been adequate.  I have asked her to continue on her current medications, and to work aggressively on weight loss.  She will continue to need yearly PFTs and chest x-ray.

## 2012-08-18 NOTE — Addendum Note (Signed)
Addended by: Ozella Almond R on: 08/18/2012 10:42 AM   Modules accepted: Orders

## 2012-08-18 NOTE — Progress Notes (Signed)
  Subjective:    Patient ID: Nicole Melendez, female    DOB: 12-16-1975, 36 y.o.   MRN: [id number]  HPI The patient comes in today for followup of her known sarcoidosis affecting both her lung and joints.  She is maintaining on methotrexate and low-dose prednisone, and has done well.  Her PFTs this summer actually showed improvement from her prior, and her joint symptoms are well controlled on her current dose of medications.  She is recently had a hysterectomy with no pulmonary complications.  She did recently get an upper respiratory infection that sounds viral, but denies any chest congestion or purulence.   Review of Systems  Constitutional: Negative for fever and unexpected weight change.  HENT: Positive for congestion, rhinorrhea and postnasal drip. Negative for ear pain, nosebleeds, sore throat, sneezing, trouble swallowing, dental problem and sinus pressure.   Eyes: Negative for redness and itching.  Respiratory: Positive for wheezing ( surgery 4 weeks ago...wheezing since sx and since catching cold). Negative for cough, chest tightness and shortness of breath.   Cardiovascular: Negative for palpitations and leg swelling.  Gastrointestinal: Negative for nausea and vomiting.  Genitourinary: Negative for dysuria.  Musculoskeletal: Negative for joint swelling.  Skin: Negative for rash.  Neurological: Positive for headaches.  Hematological: Does not bruise/bleed easily.  Psychiatric/Behavioral: Negative for dysphoric mood. The patient is not nervous/anxious.        Objective:   Physical Exam Morbidly obese female in no acute distress Nose without purulence or discharge noted Oropharynx with thrush Chest totally clear to auscultation, no wheezing Cardiac exam with regular rate and rhythm Lower extremities with no significant edema, no cyanosis Alert and oriented, moves all 4 extremities.       Assessment & Plan:

## 2012-08-25 ENCOUNTER — Other Ambulatory Visit: Payer: Self-pay | Admitting: Pulmonary Disease

## 2012-09-18 ENCOUNTER — Other Ambulatory Visit: Payer: Self-pay

## 2012-09-18 MED ORDER — GLUCOSE BLOOD VI STRP: ORAL_STRIP | Status: DC

## 2012-09-18 MED ORDER — INSULIN ASPART 100 UNIT/ML ~~LOC~~ SOLN: SUBCUTANEOUS | Status: DC

## 2012-09-29 ENCOUNTER — Telehealth: Payer: Self-pay | Admitting: Pulmonary Disease

## 2012-09-29 MED ORDER — METHOTREXATE 2.5 MG PO TABS: 10.0000 mg | ORAL_TABLET | ORAL | Status: DC

## 2012-09-29 NOTE — Telephone Encounter (Signed)
Called and spoke with pt and she stated that [facility] normally fills her prednisone and methotrexate.  She stated that she called the pharmacy last week for the refill and this has still not been done.  i apologized to the pt and i advised her that i would get this sent in for her today.  Nothing further is needed.

## 2012-09-29 NOTE — Telephone Encounter (Signed)
Duplicate message.  This medication has already been sent to the pharmacy.

## 2012-10-05 ENCOUNTER — Other Ambulatory Visit: Payer: Self-pay

## 2012-10-05 MED ORDER — ALPRAZOLAM 0.5 MG PO TABS: 0.5000 mg | ORAL_TABLET | Freq: Three times a day (TID) | ORAL | Status: DC | PRN

## 2012-10-05 NOTE — Telephone Encounter (Signed)
Faxed hardcopy to pharmacy. 

## 2012-10-05 NOTE — Telephone Encounter (Signed)
Done hardcopy to robin  

## 2012-10-06 ENCOUNTER — Other Ambulatory Visit (INDEPENDENT_AMBULATORY_CARE_PROVIDER_SITE_OTHER): Payer: 59

## 2012-10-06 DIAGNOSIS — D869 Sarcoidosis, unspecified: Secondary | ICD-10-CM

## 2012-10-06 LAB — CBC WITH DIFFERENTIAL/PLATELET
Basophils Absolute: 0 10*3/uL (ref 0.0–0.1)
HCT: 36.1 % (ref 36.0–46.0)
Lymphocytes Relative: 13 % (ref 12.0–46.0)
Lymphs Abs: 0.9 10*3/uL (ref 0.7–4.0)
Monocytes Relative: 6.5 % (ref 3.0–12.0)
Neutrophils Relative %: 77 % (ref 43.0–77.0)
Platelets: 315 10*3/uL (ref 150.0–400.0)
RDW: 18.9 % — ABNORMAL HIGH (ref 11.5–14.6)

## 2012-10-06 LAB — HEPATIC FUNCTION PANEL
AST: 21 U/L (ref 0–37)
Bilirubin, Direct: 0 mg/dL (ref 0.0–0.3)
Total Bilirubin: 0.6 mg/dL (ref 0.3–1.2)

## 2012-10-13 ENCOUNTER — Telehealth: Payer: Self-pay | Admitting: Pulmonary Disease

## 2012-10-13 NOTE — Telephone Encounter (Signed)
Notes Recorded by Barbaraann Share, MD on 10/09/2012 at 9:06 AM Please let pt know that bloodwork ok. ----  I spoke with patient about results and she verbalized understanding and had no questions

## 2012-10-26 ENCOUNTER — Encounter: Payer: Self-pay | Admitting: Internal Medicine

## 2012-10-26 ENCOUNTER — Ambulatory Visit (INDEPENDENT_AMBULATORY_CARE_PROVIDER_SITE_OTHER): Payer: 59 | Admitting: Internal Medicine

## 2012-10-26 VITALS — BP 114/86 | HR 98 | Temp 98.1°F | Ht 67.0 in | Wt 346.0 lb

## 2012-10-26 DIAGNOSIS — B370 Candidal stomatitis: Secondary | ICD-10-CM

## 2012-10-26 DIAGNOSIS — J209 Acute bronchitis, unspecified: Secondary | ICD-10-CM

## 2012-10-26 MED ORDER — LEVOFLOXACIN 500 MG PO TABS: 500.0000 mg | ORAL_TABLET | Freq: Every day | ORAL | Status: DC

## 2012-10-26 MED ORDER — NYSTATIN 100000 UNIT/ML MT SUSP: 4.0000 mL | Freq: Three times a day (TID) | OROMUCOSAL | Status: DC

## 2012-10-26 MED ORDER — PHENYLEPH-PROMETHAZINE-COD 5-6.25-10 MG/5ML PO SYRP: 5.0000 mL | ORAL_SOLUTION | Freq: Three times a day (TID) | ORAL | Status: DC | PRN

## 2012-10-26 NOTE — Patient Instructions (Signed)

## 2012-10-26 NOTE — Progress Notes (Signed)
HPI  Pt presents to the clinic today with c/o cough, wheezing and diarrhea x 4 days. She does have a history of sarcoidosis and is prone to acute bronchitis. She has been taking ibuprofen and her inhalers. They have helped a little. She does feel fatigued and short of breath. She denies chest pains. She has taken lomotil for her diarrhea which has helped some. She does c/o of some nausea but no vomiting. She has had sick contacts. Additionally, she does c/o thrush in her mouth. This is a chronic problem for her since she in using her inhalers and on daily prednisone. She has used nystatin swish and swallow in the past which helps but she ran out of it.  Review of Systems    Past Medical History  Diagnosis Date  . Morbid obesity   . Peptic ulcer disease   . GERD (gastroesophageal reflux disease)   . Migraine   . Anxiety   . Anemia   . Hyperlipidemia   . Menorrhagia   . PNA (pneumonia) july 2011  . Diabetes mellitus type II     steroid related  . Depression   . Sarcoid     including hand per rheumatology-Dr. Dierdre Forth  . Allergic rhinitis, cause unspecified   . Varicose veins with pain   . Positive ANA (antinuclear antibody) 02/14/2012  . Shortness of breath     on exertion  . History of blood transfusion   . Sarcoidosis of lung     Family History  Problem Relation Age of Onset  . Diabetes    . Hypertension    . Allergies Mother   . Heart attack Mother   . Heart disease Father   . Rheum arthritis Father   . Ovarian cancer Maternal Aunt   . Lung cancer Maternal Aunt   . Breast cancer Maternal Aunt   . Bone cancer Maternal Aunt   . Other Neg Hx     History   Social History  . Marital Status: Single    Spouse Name: N/A    Number of Children: 0  . Years of Education: N/A   Occupational History  . tobacco worker    Social History Main Topics  . Smoking status: Former Smoker -- 1.0 packs/day for 20 years    Types: Cigarettes    Quit date: 04/24/2010  . Smokeless  tobacco: Not on file     Comment: less than 1 ppd.  started at age 23.    Marland Kitchen Alcohol Use: No  . Drug Use: No  . Sexually Active: Yes   Other Topics Concern  . Not on file   Social History Narrative   Pt lives with Dalbert Batman- also pt of [facility]    Allergies  Allergen Reactions  . Azithromycin     (z pak) hives  . Oxycodone-Acetaminophen     REACTION: difficulty breathing  . Penicillins     REACTION: swelling and difficulty breathing     Constitutional: Positive headache, fatigue and fever. Denies abrupt weight changes.  HEENT:  Positive sore throat. Denies eye redness, eye pain, pressure behind the eyes, facial pain, nasal congestion ear pain, ringing in the ears, wax buildup, runny nose or bloody nose. Respiratory: Positive cough and thick green sputum production. Denies difficulty breathing or shortness of breath.  Cardiovascular: Denies chest pain, chest tightness, palpitations or swelling in the hands or feet.   No other specific complaints in a complete review of systems (except as listed in HPI above).  Objective:    General: Appears her stated age, well developed, well nourished in NAD. HEENT: Head: normal shape and size; Eyes: sclera white, no icterus, conjunctiva pink, PERRLA and EOMs intact; Ears: Tm's gray and intact, normal light reflex; Nose: mucosa pink and moist, septum midline; Throat/Mouth: + PND. Teeth present, mucosa pink and moist, no exudate noted, no lesions or ulcerations noted.  Neck: Mild cervical lymphadenopathy. Neck supple, trachea midline. No massses, lumps or thyromegaly present.  Cardiovascular: Normal rate and rhythm. S1,S2 noted.  No murmur, rubs or gallops noted. No JVD or BLE edema. No carotid bruits noted. Pulmonary/Chest: Normal effort and scattered ronchi throughout. No respiratory distress. No wheezes, rales noted.      Assessment & Plan:   Acute bronchitis  eRx given for Levaquin RX faxed for phenergen with codeine  Thrush, new  onset with additional workup required:  Refilled Nystatin swish and swallow  RTC as needed or if symptoms persist.

## 2012-10-29 ENCOUNTER — Ambulatory Visit (INDEPENDENT_AMBULATORY_CARE_PROVIDER_SITE_OTHER): Payer: 59 | Admitting: Emergency Medicine

## 2012-10-29 VITALS — BP 132/79 | HR 80 | Temp 98.4°F | Resp 18 | Ht 66.0 in | Wt 346.0 lb

## 2012-10-29 DIAGNOSIS — R05 Cough: Secondary | ICD-10-CM

## 2012-10-29 DIAGNOSIS — J99 Respiratory disorders in diseases classified elsewhere: Secondary | ICD-10-CM

## 2012-10-29 DIAGNOSIS — R059 Cough, unspecified: Secondary | ICD-10-CM

## 2012-10-29 DIAGNOSIS — J40 Bronchitis, not specified as acute or chronic: Secondary | ICD-10-CM

## 2012-10-29 NOTE — Progress Notes (Signed)
  Subjective:    Patient ID: Nicole Melendez, female    DOB: [birth year], 37 y.o.   MRN: [id number]  HPI patient enters with a chief complaint of cough. She was seen by her PCP she has a history of sarcoidosis. She was treated with Levaquin and is feeling significantly better    Review of Systems     Objective:   Physical Exam H. EENT exam is normal. Neck is supple. Chest is clear to auscultation and percussion Pulse ox was checked in the office and it was      Assessment & Plan:  Patient much improved from recent office visit. She is on Levaquin and getting better. I have released her to return to work tomorrow.

## 2012-11-10 ENCOUNTER — Other Ambulatory Visit: Payer: Self-pay | Admitting: Internal Medicine

## 2012-11-13 ENCOUNTER — Ambulatory Visit (INDEPENDENT_AMBULATORY_CARE_PROVIDER_SITE_OTHER): Payer: 59 | Admitting: Internal Medicine

## 2012-11-13 ENCOUNTER — Encounter: Payer: Self-pay | Admitting: Internal Medicine

## 2012-11-13 ENCOUNTER — Telehealth: Payer: Self-pay | Admitting: Internal Medicine

## 2012-11-13 VITALS — BP 108/62 | HR 101 | Temp 98.2°F | Ht 67.0 in | Wt 343.0 lb

## 2012-11-13 DIAGNOSIS — R05 Cough: Secondary | ICD-10-CM | POA: Insufficient documentation

## 2012-11-13 DIAGNOSIS — F411 Generalized anxiety disorder: Secondary | ICD-10-CM

## 2012-11-13 DIAGNOSIS — R059 Cough, unspecified: Secondary | ICD-10-CM

## 2012-11-13 DIAGNOSIS — E119 Type 2 diabetes mellitus without complications: Secondary | ICD-10-CM

## 2012-11-13 DIAGNOSIS — N760 Acute vaginitis: Secondary | ICD-10-CM

## 2012-11-13 DIAGNOSIS — F17200 Nicotine dependence, unspecified, uncomplicated: Secondary | ICD-10-CM

## 2012-11-13 MED ORDER — HYDROCODONE-HOMATROPINE 5-1.5 MG/5ML PO SYRP: 5.0000 mL | ORAL_SOLUTION | Freq: Four times a day (QID) | ORAL | Status: DC | PRN

## 2012-11-13 MED ORDER — VARENICLINE TARTRATE 1 MG PO TABS: 1.0000 mg | ORAL_TABLET | Freq: Two times a day (BID) | ORAL | Status: DC

## 2012-11-13 MED ORDER — SERTRALINE HCL 100 MG PO TABS: 100.0000 mg | ORAL_TABLET | Freq: Every day | ORAL | Status: DC

## 2012-11-13 MED ORDER — VARENICLINE TARTRATE 0.5 MG PO TABS: 0.5000 mg | ORAL_TABLET | Freq: Two times a day (BID) | ORAL | Status: DC

## 2012-11-13 MED ORDER — FLUCONAZOLE 150 MG PO TABS: ORAL_TABLET | ORAL | Status: DC

## 2012-11-13 NOTE — Telephone Encounter (Signed)
Called the patient informed letter was faxed as requested from VM message to her work fax number [phone]

## 2012-11-13 NOTE — Progress Notes (Signed)
Subjective:    Patient ID: Nicole Melendez, female    DOB: 02-17-1976, 37 y.o.   MRN: [id number]  HPI  Here to f/u with persistent nonprod cough, has been ongoing since tx for infection with levaquin, now without fever, productivity, and Pt denies chest pain, increased sob or doe, wheezing, orthopnea, PND, increased LE swelling, palpitations, dizziness or syncope.  But still with cough controleld ok with tessalon during the day, but very disruptive to sleep in the past wk and cannot function well at work due to fatigue, lack of sleep and persistent general weakness/malaise.  Incidentally with mild vaginal d/c c/w prob yeast and reqeusts tx.  Denies worsening depressive symptoms, suicidal ideation, or panic; has ongoing anxiety, some increased stressors recently, and asks for increased zoloft from 50 to 100 as 50 no longer really working as well.  Wants to quit smoking, and asks for chantix, as she took this successfully before.   Pt denies polydipsia, polyuria.  Pt denies new neurological symptoms such as new headache, or facial or extremity weakness or numbnes   Plans for gastric sleeve procedure soon Past Medical History  Diagnosis Date  . Morbid obesity   . Peptic ulcer disease   . GERD (gastroesophageal reflux disease)   . Migraine   . Anxiety   . Anemia   . Hyperlipidemia   . Menorrhagia   . PNA (pneumonia) july 2011  . Diabetes mellitus type II     steroid related  . Depression   . Sarcoid     including hand per rheumatology-Dr. Dierdre Forth  . Allergic rhinitis, cause unspecified   . Varicose veins with pain   . Positive ANA (antinuclear antibody) 02/14/2012  . Shortness of breath     on exertion  . History of blood transfusion   . Sarcoidosis of lung    Past Surgical History  Procedure Date  . Uterine ablation 03/2010  . Wisdom tooth extraction     reports that she quit smoking about 2 years ago. Her smoking use included Cigarettes. She has a 20 pack-year smoking history. She does  not have any smokeless tobacco history on file. She reports that she does not drink alcohol or use illicit drugs. family history includes Allergies in her mother; Bone cancer in her maternal aunt; Breast cancer in her maternal aunt; Diabetes in an unspecified family member; Heart attack in her mother; Heart disease in her father; Hypertension in an unspecified family member; Lung cancer in her maternal aunt; Ovarian cancer in her maternal aunt; and Rheum arthritis in her father.  There is no history of Other. Allergies  Allergen Reactions  . Azithromycin     (z pak) hives  . Oxycodone-Acetaminophen     REACTION: difficulty breathing  . Penicillins     REACTION: swelling and difficulty breathing   Current Outpatient Prescriptions on File Prior to Visit  Medication Sig Dispense Refill  . acetaminophen (TYLENOL) 500 MG tablet Take 1,000 mg by mouth every 6 (six) hours as needed. For pain      . ALPRAZolam (XANAX) 0.5 MG tablet Take 1 tablet (0.5 mg total) by mouth 3 (three) times daily as needed. For anxiety  90 tablet  1  . B-D UF III MINI PEN NEEDLES 31G X 5 MM MISC USE FOUR TIMES DAILY AS NEEDED PER SLIDING SCALE  200 each  11  . benzonatate (TESSALON) 100 MG capsule Take 100-200 mg by mouth 3 (three) times daily as needed. For cough      .  CELEBREX 200 MG capsule Take 200 mg by mouth daily as needed. For pain      . cetirizine (ZYRTEC) 10 MG tablet Take 10 mg by mouth daily as needed. For allergies      . cyclobenzaprine (FLEXERIL) 5 MG tablet Take 5 mg by mouth daily as needed. For spasms      . fluticasone (FLONASE) 50 MCG/ACT nasal spray Place 2 sprays into the nose daily as needed. For allergies      . folic acid (FOLVITE) 1 MG tablet Take 1 mg by mouth daily.      . furosemide (LASIX) 20 MG tablet Take 20 mg by mouth daily as needed. For fluid retention      . glucose blood (ONE TOUCH ULTRA TEST) test strip Use as instructed  200 each  11  . HYDROmorphone (DILAUDID) 2 MG tablet Take 1-2  mg by mouth every 4 (four) hours as needed. For pain      . insulin aspart (NOVOLOG FLEXPEN) 100 UNIT/ML injection Only as needed  3 mL  6  . levalbuterol (XOPENEX HFA) 45 MCG/ACT inhaler Inhale 2 puffs into the lungs 2 (two) times daily.      Marland Kitchen levofloxacin (LEVAQUIN) 500 MG tablet Take 1 tablet (500 mg total) by mouth daily.  7 tablet  0  . methotrexate (RHEUMATREX) 2.5 MG tablet TAKE 4 TABLETS BY MOUTH ONCE A WEEK  16 tablet  0  . nystatin (MYCOSTATIN) 100000 UNIT/ML suspension Take 4 mLs (400,000 Units total) by mouth 3 (three) times daily. Swish and swallow as directed  120 mL  0  . omeprazole (PRILOSEC) 20 MG capsule Take 20 mg by mouth daily.      Letta Pate DELICA LANCETS MISC USE TO TEST FOUR TIMES DAILY  200 each  11  . Phenyleph-Promethazine-Cod 5-6.25-10 MG/5ML SYRP Take 5 mLs by mouth 3 (three) times daily as needed.  180 mL  0  . predniSONE (DELTASONE) 5 MG tablet Take 5 mg by mouth daily.      . sitaGLIPtan-metformin (JANUMET) 50-500 MG per tablet Take 1 tablet by mouth 2 (two) times daily with a meal.  180 tablet  3  . SUMAtriptan (IMITREX) 100 MG tablet Take 100 mg by mouth every 2 (two) hours as needed. For migraines      . SYMBICORT 160-4.5 MCG/ACT inhaler INHALE 2 PUFFS BY MOUTH TWICE DAILY  10.2 g  3  . traMADol (ULTRAM) 50 MG tablet Take 1-2 tablets (50-100 mg total) by mouth every 6 (six) hours as needed.  30 tablet  0  . zolpidem (AMBIEN CR) 12.5 MG CR tablet Take 12.5 mg by mouth at bedtime.       Review of Systems  Constitutional: Negative for unexpected weight change, or unusual diaphoresis  HENT: Negative for tinnitus.   Eyes: Negative for photophobia and visual disturbance.  Respiratory: Negative for choking and stridor.   Gastrointestinal: Negative for vomiting and blood in stool.  Genitourinary: Negative for hematuria and decreased urine volume.  Musculoskeletal: Negative for acute joint swelling Skin: Negative for color change and wound.  Neurological:  Negative for tremors and numbness other than noted  Psychiatric/Behavioral: Negative for decreased concentration or  hyperactivity.       Objective:   Physical Exam BP 108/62  Pulse 101  Temp 98.2 F (36.8 C) (Oral)  Ht 5\' 7"  (1.702 m)  Wt 343 lb (155.584 kg)  BMI 53.72 kg/m2  SpO2 97%  LMP 07/17/2012 VS noted, not ill appaering,  but fatigued Constitutional: Pt appears well-developed and well-nourished.  HENT: Head: NCAT.  Right Ear: External ear normal.  Left Ear: External ear normal.  Bilat tm's with mild erythema.  Max sinus areas mild tender.  Pharynx with mild erythema, no exudate Eyes: Conjunctivae and EOM are normal. Pupils are equal, round, and reactive to light.  Neck: Normal range of motion. Neck supple.  Cardiovascular: Normal rate and regular rhythm.   Pulmonary/Chest: Effort normal and breath sounds normal. , no rales or wheezing Abd:  Soft, NT, non-distended, + BS Neurological: Pt is alert. Not confused  Skin: Skin is warm. No erythema.  Psychiatric: Pt behavior is normal. Thought content normal.     Assessment & Plan:

## 2012-11-13 NOTE — Assessment & Plan Note (Addendum)
etiolgoy unclear, on mult meds for allergy, asthma and PPI, - ? Post infectious, exam bening - for cough med,  to f/u any worsening symptoms or concerns, hold on cxr or pulm at this time

## 2012-11-13 NOTE — Telephone Encounter (Signed)
Patient was just here for an appointment and forgot to get a work note, she is requesting a call back when it is ready for pick up

## 2012-11-13 NOTE — Patient Instructions (Addendum)
Please take all new medication as prescribed - the cough medicine, diflucan for yeast, chantix OK to increase the zoloft to 100 mg per day Please continue all other medications as before

## 2012-11-17 ENCOUNTER — Ambulatory Visit: Payer: Self-pay | Admitting: Bariatrics

## 2012-11-17 DIAGNOSIS — E7800 Pure hypercholesterolemia, unspecified: Secondary | ICD-10-CM

## 2012-11-17 LAB — CBC WITH DIFFERENTIAL/PLATELET
Eosinophil #: 0.2 10*3/uL (ref 0.0–0.7)
HCT: 34.6 % — ABNORMAL LOW (ref 35.0–47.0)
HGB: 11.4 g/dL — ABNORMAL LOW (ref 12.0–16.0)
MCHC: 32.9 g/dL (ref 32.0–36.0)
MCV: 74 fL — ABNORMAL LOW (ref 80–100)
Monocyte #: 0.6 x10 3/mm (ref 0.2–0.9)
Neutrophil #: 7 10*3/uL — ABNORMAL HIGH (ref 1.4–6.5)
Neutrophil %: 79.9 %
WBC: 8.8 10*3/uL (ref 3.6–11.0)

## 2012-11-17 LAB — PROTIME-INR
INR: 1.3
Prothrombin Time: 16.3 secs — ABNORMAL HIGH (ref 11.5–14.7)

## 2012-11-17 LAB — COMPREHENSIVE METABOLIC PANEL
Anion Gap: 6 — ABNORMAL LOW (ref 7–16)
Bilirubin,Total: 0.4 mg/dL (ref 0.2–1.0)
Calcium, Total: 9.1 mg/dL (ref 8.5–10.1)
Co2: 28 mmol/L (ref 21–32)
Creatinine: 0.78 mg/dL (ref 0.60–1.30)
EGFR (African American): >60
EGFR (Non-African Amer.): >60
Osmolality: 274 (ref 275–301)
Potassium: 3.7 mmol/L (ref 3.5–5.1)
SGOT(AST): 19 U/L (ref 15–37)
SGPT (ALT): 25 U/L (ref 12–78)
Sodium: 138 mmol/L (ref 136–145)
Total Protein: 7.5 g/dL (ref 6.4–8.2)

## 2012-11-17 LAB — AMYLASE: Amylase: 40 U/L (ref 25–115)

## 2012-11-17 LAB — IRON AND TIBC
Iron Saturation: 9 %
Iron: 31 ug/dL — ABNORMAL LOW (ref 50–170)
Unbound Iron-Bind.Cap.: 332 ug/dL

## 2012-11-17 LAB — LIPASE, BLOOD: Lipase: 188 U/L (ref 73–393)

## 2012-11-17 LAB — MAGNESIUM: Magnesium: 1.8 mg/dL

## 2012-11-17 LAB — BILIRUBIN, DIRECT: Bilirubin, Direct: <0.1 mg/dL (ref 0.00–0.20)

## 2012-11-17 LAB — APTT: Activated PTT: 32.1 secs (ref 23.6–35.9)

## 2012-11-19 ENCOUNTER — Encounter: Payer: Self-pay | Admitting: Internal Medicine

## 2012-11-19 DIAGNOSIS — F17200 Nicotine dependence, unspecified, uncomplicated: Secondary | ICD-10-CM | POA: Insufficient documentation

## 2012-11-19 DIAGNOSIS — N760 Acute vaginitis: Secondary | ICD-10-CM | POA: Insufficient documentation

## 2012-11-19 NOTE — Assessment & Plan Note (Signed)
For increased zoloft to 100 mg,  to f/u any worsening symptoms or concerns, declines counseling referrral

## 2012-11-19 NOTE — Assessment & Plan Note (Signed)
stable overall by history and exam, recent data reviewed with pt, and pt to continue medical treatment as before,  to f/u any worsening symptoms or concerns Lab Results  Component Value Date   HGBA1C 6.8* 06/27/2012

## 2012-11-19 NOTE — Assessment & Plan Note (Signed)
Ok for chantix as reqeusted,  to f/u any worsening symptoms or concerns

## 2012-11-19 NOTE — Assessment & Plan Note (Signed)
Ok for diflucan asd, to f/u any worsening symptoms or concerns 

## 2012-11-24 ENCOUNTER — Ambulatory Visit: Payer: Self-pay | Admitting: Bariatrics

## 2012-11-25 ENCOUNTER — Ambulatory Visit: Payer: Self-pay | Admitting: Bariatrics

## 2012-12-04 ENCOUNTER — Other Ambulatory Visit: Payer: Self-pay | Admitting: Internal Medicine

## 2012-12-08 ENCOUNTER — Other Ambulatory Visit: Payer: Self-pay | Admitting: Internal Medicine

## 2012-12-14 ENCOUNTER — Ambulatory Visit: Payer: 59 | Admitting: Internal Medicine

## 2012-12-14 ENCOUNTER — Telehealth: Payer: Self-pay

## 2012-12-14 ENCOUNTER — Ambulatory Visit (INDEPENDENT_AMBULATORY_CARE_PROVIDER_SITE_OTHER): Payer: 59 | Admitting: Physician Assistant

## 2012-12-14 VITALS — BP 135/87 | HR 88 | Temp 98.8°F | Resp 17 | Ht 66.0 in | Wt 339.0 lb

## 2012-12-14 DIAGNOSIS — M130 Polyarthritis, unspecified: Secondary | ICD-10-CM

## 2012-12-14 MED ORDER — TRAMADOL HCL 50 MG PO TABS: 50.0000 mg | ORAL_TABLET | Freq: Four times a day (QID) | ORAL | Status: DC | PRN

## 2012-12-14 NOTE — Telephone Encounter (Signed)
Called the PA to inform of MD response and she stated she had returned to the patients room after leaving [place] a message.  The patient stated she did not need Dilaudid, but would like her Tramadol refilled and could go home sleep it off and go to work tonight.  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

## 2012-12-14 NOTE — Progress Notes (Signed)
  Subjective:    Patient ID: Nicole Melendez, female    DOB: 27-Mar-1976, 37 y.o.   MRN: [id number]  HPI A 37 year old AA female presents for bilateral wrist and hand pain since 4:30 am this morning. Hands were very swollen this morning but have progressively improved.  Pain feels like "normal sarcoid flare-up."  Rates pain 15/10.  Pt unable to see PCP until 4:15 pm and states "the pain is too severe to wait that long."  Pt normally followed by Dr. [name], PCP, and Dr. Mallie Mussel, endocrinologist.  Pt has appt with Dr. Mallie Mussel on Monday.  Pt denies f/c, CP, SOB.  Pt normally takes Dilaudid for pain during flare-ups.  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   Review of Systems   as above  Objective:   Physical Exam   General:  Pleasant, obese female.  NAD. HEENT:  PERRLA.  EOMs intact.   Heart:  RRR.  Normal S1,S2.  No m/g/r. Lungs:  CTAB. MSK:  Full ROM bilateral wrists and elbows.  Bilateral edema phalanges, more so right hand.  5/5 grip strength. Neuro:  A&Ox3.  Cranial nerves intact.  Sensation intact. Skin:  Warm and moist.  No rashes.      Assessment & Plan:  Polyarthritis - Plan: traMADol (ULTRAM) 50 MG tablet Spoke with Dr. Raphael Gibney nurse regarding the patient's apparent request for Dilaudid.  Actually, she is asking only for Tramadol, which she believes will allow her to "break the pain," take a nap, and then go to work this evening as planned.  Our impression that the patient's pain is real, not inflated, is confirmed by her PCP who indicates that she has not exhibited narcotic seeking behavior in the past.    Patient Instructions  Follow up with Dr. Jonny Ruiz and other healthcare providers per their directions.

## 2012-12-14 NOTE — Patient Instructions (Signed)
Follow up with Dr. Jonny Ruiz and other healthcare providers per their directions.

## 2012-12-14 NOTE — Telephone Encounter (Signed)
Although somewhat unusual, she has been tx as outpt in the past with limited prescriptoin for diluadid, has not been drug seeking or diverting in the past, and I think ok for limited rx

## 2012-12-14 NOTE — Telephone Encounter (Signed)
PA from urgent care needs advise as the patient is being seen at this time.  She presents with a flare up of her sarcoid in her hand and wrist.  She is requesting Dilaudid. Please advise  Call back number is [phone] or [phone]

## 2012-12-22 ENCOUNTER — Encounter: Payer: Self-pay | Admitting: Internal Medicine

## 2012-12-22 ENCOUNTER — Other Ambulatory Visit: Payer: Self-pay | Admitting: Internal Medicine

## 2012-12-22 ENCOUNTER — Ambulatory Visit (INDEPENDENT_AMBULATORY_CARE_PROVIDER_SITE_OTHER): Payer: 59 | Admitting: Internal Medicine

## 2012-12-22 VITALS — BP 124/72 | HR 76 | Temp 97.5°F | Ht 66.0 in | Wt 344.0 lb

## 2012-12-22 DIAGNOSIS — M130 Polyarthritis, unspecified: Secondary | ICD-10-CM

## 2012-12-22 DIAGNOSIS — F411 Generalized anxiety disorder: Secondary | ICD-10-CM

## 2012-12-22 NOTE — Patient Instructions (Signed)

## 2012-12-22 NOTE — Telephone Encounter (Signed)
Done hardcopy to robin  

## 2012-12-22 NOTE — Assessment & Plan Note (Signed)
Declined per Dr. Jonny Ruiz to give pt Dilaudid at this time. He prefers that her rheumatologist evaluate and treat pt's pain

## 2012-12-22 NOTE — Progress Notes (Signed)
Subjective:    Patient ID: Nicole Melendez, female    DOB: July 03, 1976, 37 y.o.   MRN: [id number]  HPI  Pt presents to the clinic today with c/o increased anxiety secondary to being in increased pain in her hands due to the rheumatoid arthritis. She has seen the rheumatologist who has her on prednisone, methotrexate and tramadol. The patient states that "she eats the tramadol like candy and it does nothing for her pain" She is requesting a stronger pain medicines. She says that she can't take vicodin because it makes her sick and she is allergic to percocet.  Review of Systems      Past Medical History  Diagnosis Date  . Morbid obesity   . Peptic ulcer disease   . GERD (gastroesophageal reflux disease)   . Migraine   . Anxiety   . Anemia   . Hyperlipidemia   . Menorrhagia   . PNA (pneumonia) july 2011  . Diabetes mellitus type II     steroid related  . Depression   . Sarcoid     including hand per rheumatology-Dr. Dierdre Forth  . Allergic rhinitis, cause unspecified   . Varicose veins with pain   . Positive ANA (antinuclear antibody) 02/14/2012  . Shortness of breath     on exertion  . History of blood transfusion   . Sarcoidosis of lung     Current Outpatient Prescriptions  Medication Sig Dispense Refill  . acetaminophen (TYLENOL) 500 MG tablet Take 1,000 mg by mouth every 6 (six) hours as needed. For pain      . ALPRAZolam (XANAX) 0.5 MG tablet TAKE 1 TABLET BY MOUTH THREE TIMES DAILY AS NEEDED FOR ANXIETY  90 tablet  01  . B-D UF III MINI PEN NEEDLES 31G X 5 MM MISC USE FOUR TIMES DAILY AS NEEDED PER SLIDING SCALE  200 each  11  . CELEBREX 200 MG capsule Take 200 mg by mouth daily as needed. For pain      . cetirizine (ZYRTEC) 10 MG tablet Take 10 mg by mouth daily as needed. For allergies      . cyclobenzaprine (FLEXERIL) 5 MG tablet Take 5 mg by mouth daily as needed. For spasms      . fluticasone (FLONASE) 50 MCG/ACT nasal spray Place 2 sprays into the nose daily as  needed. For allergies      . folic acid (FOLVITE) 1 MG tablet Take 1 mg by mouth daily.      . furosemide (LASIX) 20 MG tablet Take 20 mg by mouth daily as needed. For fluid retention      . glucose blood (ONE TOUCH ULTRA TEST) test strip Use as instructed  200 each  11  . insulin aspart (NOVOLOG FLEXPEN) 100 UNIT/ML injection Only as needed  3 mL  6  . methotrexate (RHEUMATREX) 2.5 MG tablet TAKE 4 TABLETS BY MOUTH ONCE A WEEK  16 tablet  0  . nystatin (MYCOSTATIN) 100000 UNIT/ML suspension TAKE 4 ML BY MOUTH THREE TIMES DAILY . SWISH AND SWALLOW AS DIRECTED  120 mL  0  . omeprazole (PRILOSEC) 20 MG capsule Take 20 mg by mouth daily.      Letta Pate DELICA LANCETS MISC USE TO TEST FOUR TIMES DAILY  200 each  11  . predniSONE (DELTASONE) 5 MG tablet Take 5 mg by mouth daily.      . sertraline (ZOLOFT) 100 MG tablet Take 1 tablet (100 mg total) by mouth daily.  90 tablet  3  .  sitaGLIPtan-metformin (JANUMET) 50-500 MG per tablet Take 1 tablet by mouth 2 (two) times daily with a meal.  180 tablet  3  . SUMAtriptan (IMITREX) 100 MG tablet Take 100 mg by mouth every 2 (two) hours as needed. For migraines      . SYMBICORT 160-4.5 MCG/ACT inhaler INHALE 2 PUFFS BY MOUTH TWICE DAILY  10.2 g  3  . traMADol (ULTRAM) 50 MG tablet Take 1-2 tablets (50-100 mg total) by mouth every 6 (six) hours as needed.  30 tablet  0  . varenicline (CHANTIX CONTINUING MONTH PAK) 1 MG tablet Take 1 tablet (1 mg total) by mouth 2 (two) times daily.  60 tablet  1  . zolpidem (AMBIEN CR) 12.5 MG CR tablet Take 12.5 mg by mouth at bedtime.       No current facility-administered medications for this visit.    Allergies  Allergen Reactions  . Azithromycin     (z pak) hives  . Oxycodone-Acetaminophen     REACTION: difficulty breathing  . Penicillins     REACTION: swelling and difficulty breathing    Family History  Problem Relation Age of Onset  . Diabetes    . Hypertension    . Allergies Mother   . Heart attack  Mother   . Heart disease Father   . Rheum arthritis Father   . Ovarian cancer Maternal Aunt   . Lung cancer Maternal Aunt   . Breast cancer Maternal Aunt   . Bone cancer Maternal Aunt   . Other Neg Hx     History   Social History  . Marital Status: Single    Spouse Name: N/A    Number of Children: 0  . Years of Education: N/A   Occupational History  . tobacco worker    Social History Main Topics  . Smoking status: Former Smoker -- 1.00 packs/day for 20 years    Types: Cigarettes    Quit date: 04/24/2010  . Smokeless tobacco: Not on file     Comment: less than 1 ppd.  started at age 62.    Marland Kitchen Alcohol Use: No  . Drug Use: No  . Sexually Active: Yes   Other Topics Concern  . Not on file   Social History Narrative   Pt lives with Dalbert Batman- also pt of [facility]     Constitutional: Denies fever, malaise, fatigue, headache or abrupt weight changes.  Musculoskeletal: Pt reports joint pain and swelling in her hands. Denies decrease in range of motion, difficulty with gait, muscle pain.  Psych: Pt reports anxiety. Denies depression, SI/HI.  No other specific complaints in a complete review of systems (except as listed in HPI above).  Objective:   Physical Exam   BP 124/72  Pulse 76  Temp(Src) 97.5 F (36.4 C) (Oral)  Ht 5\' 6"  (1.676 m)  Wt 344 lb (156.037 kg)  BMI 55.55 kg/m2  SpO2 99%  LMP 07/17/2012 Wt Readings from Last 3 Encounters:  12/22/12 344 lb (156.037 kg)  12/14/12 339 lb (153.769 kg)  11/13/12 343 lb (155.584 kg)    General: Appears her stated age, obese but well developed, well nourished in NAD. Cardiovascular: Normal rate and rhythm. S1,S2 noted.  No murmur, rubs or gallops noted. No JVD or BLE edema. No carotid bruits noted. Pulmonary/Chest: Normal effort and positive vesicular breath sounds. No respiratory distress. No wheezes, rales or ronchi noted.  Musculoskeletal: Normal range of motion. No signs of joint swelling. No difficulty with gait.   Psychiatric:  Mood and affect normal. Behavior is normal. Judgment and thought content normal.       Assessment & Plan:

## 2012-12-22 NOTE — Assessment & Plan Note (Signed)
Take one tab in the morning and 2 at night See how this regimen does for the next week Pt declined referral for counseling

## 2012-12-22 NOTE — Telephone Encounter (Signed)
Faxed hardcopy to pharmacy. 

## 2012-12-28 ENCOUNTER — Other Ambulatory Visit: Payer: Self-pay | Admitting: Internal Medicine

## 2013-01-03 ENCOUNTER — Encounter: Payer: Self-pay | Admitting: Internal Medicine

## 2013-01-03 ENCOUNTER — Ambulatory Visit (INDEPENDENT_AMBULATORY_CARE_PROVIDER_SITE_OTHER): Payer: 59 | Admitting: Internal Medicine

## 2013-01-03 VITALS — BP 104/68 | HR 131 | Temp 97.9°F | Ht 67.0 in | Wt 340.1 lb

## 2013-01-03 DIAGNOSIS — Z23 Encounter for immunization: Secondary | ICD-10-CM

## 2013-01-03 DIAGNOSIS — F411 Generalized anxiety disorder: Secondary | ICD-10-CM

## 2013-01-03 DIAGNOSIS — E119 Type 2 diabetes mellitus without complications: Secondary | ICD-10-CM

## 2013-01-03 DIAGNOSIS — F419 Anxiety disorder, unspecified: Secondary | ICD-10-CM

## 2013-01-03 DIAGNOSIS — Z0000 Encounter for general adult medical examination without abnormal findings: Secondary | ICD-10-CM

## 2013-01-03 MED ORDER — CLONAZEPAM 1 MG PO TABS: 1.0000 mg | ORAL_TABLET | Freq: Two times a day (BID) | ORAL | Status: DC | PRN

## 2013-01-03 MED ORDER — SERTRALINE HCL 100 MG PO TABS: ORAL_TABLET | ORAL | Status: DC

## 2013-01-03 NOTE — Progress Notes (Signed)
Subjective:    Patient ID: Nicole Melendez, female    DOB: 1976/03/19, 37 y.o.   MRN: [id number]  HPI  Here for wellness and f/u;  Overall doing ok;  Pt denies CP, worsening SOB, DOE, wheezing, orthopnea, PND, worsening LE edema, palpitations, dizziness or syncope.  Pt denies neurological change such as new headache, facial or extremity weakness.  Pt denies polydipsia, polyuria, or low sugar symptoms. Pt states overall good compliance with treatment and medications, good tolerability, and has been trying to follow lower cholesterol diet.  Pt denies worsening depressive symptoms, suicidal ideation or panic. No fever, night sweats, wt loss, loss of appetite, or other constitutional symptoms.  Pt states good ability with ADL's, has low fall risk, home safety reviewed and adequate, no other significant changes in hearing or vision, and only occasionally active with exercise. Unfortunately also with More stressors at work and home, with several recent panic attacks, despite taking her zoloft.  Due for gastric sleave surgury soon. She plans to call insurance to determine her coverage, and self-refer for counseling soon.  Denies worsening depressive symptoms, suicidal ideation Past Medical History  Diagnosis Date  . Morbid obesity   . Peptic ulcer disease   . GERD (gastroesophageal reflux disease)   . Migraine   . Anxiety   . Anemia   . Hyperlipidemia   . Menorrhagia   . PNA (pneumonia) july 2011  . Diabetes mellitus type II     steroid related  . Depression   . Sarcoid     including hand per rheumatology-Dr. Dierdre Forth  . Allergic rhinitis, cause unspecified   . Varicose veins with pain   . Positive ANA (antinuclear antibody) 02/14/2012  . Shortness of breath     on exertion  . History of blood transfusion   . Sarcoidosis of lung    Past Surgical History  Procedure Laterality Date  . Uterine ablation  03/2010  . Wisdom tooth extraction    . Abdominal hysterectomy      reports that she quit  smoking about 2 years ago. Her smoking use included Cigarettes. She has a 20 pack-year smoking history. She does not have any smokeless tobacco history on file. She reports that she does not drink alcohol or use illicit drugs. family history includes Allergies in her mother; Bone cancer in her maternal aunt; Breast cancer in her maternal aunt; Diabetes in an unspecified family member; Heart attack in her mother; Heart disease in her father; Hypertension in an unspecified family member; Lung cancer in her maternal aunt; Ovarian cancer in her maternal aunt; and Rheum arthritis in her father.  There is no history of Other. Allergies  Allergen Reactions  . Azithromycin     (z pak) hives  . Oxycodone-Acetaminophen     REACTION: difficulty breathing  . Penicillins     REACTION: swelling and difficulty breathing   Current Outpatient Prescriptions on File Prior to Visit  Medication Sig Dispense Refill  . acetaminophen (TYLENOL) 500 MG tablet Take 1,000 mg by mouth every 6 (six) hours as needed. For pain      . ALPRAZolam (XANAX) 0.5 MG tablet TAKE 1 TABLET BY MOUTH THREE TIMES DAILY AS NEEDED FOR ANXIETY  90 tablet  01  . B-D UF III MINI PEN NEEDLES 31G X 5 MM MISC USE FOUR TIMES DAILY AS NEEDED FOR SLIDING SCALE  200 each  0  . CELEBREX 200 MG capsule Take 200 mg by mouth daily as needed. For pain      .  cetirizine (ZYRTEC) 10 MG tablet Take 10 mg by mouth daily as needed. For allergies      . cyclobenzaprine (FLEXERIL) 5 MG tablet Take 5 mg by mouth daily as needed. For spasms      . fluticasone (FLONASE) 50 MCG/ACT nasal spray Place 2 sprays into the nose daily as needed. For allergies      . folic acid (FOLVITE) 1 MG tablet Take 1 mg by mouth daily.      . furosemide (LASIX) 20 MG tablet Take 20 mg by mouth daily as needed. For fluid retention      . glucose blood (ONE TOUCH ULTRA TEST) test strip Use as instructed  200 each  11  . insulin aspart (NOVOLOG FLEXPEN) 100 UNIT/ML injection Only as  needed  3 mL  6  . methotrexate (RHEUMATREX) 2.5 MG tablet TAKE 4 TABLETS BY MOUTH ONCE A WEEK  16 tablet  0  . nystatin (MYCOSTATIN) 100000 UNIT/ML suspension TAKE 4 ML BY MOUTH THREE TIMES DAILY . SWISH AND SWALLOW AS DIRECTED  120 mL  0  . omeprazole (PRILOSEC) 20 MG capsule Take 20 mg by mouth daily.      Letta Pate DELICA LANCETS MISC USE TO TEST FOUR TIMES DAILY  200 each  11  . predniSONE (DELTASONE) 5 MG tablet Take 5 mg by mouth daily.      . sitaGLIPtan-metformin (JANUMET) 50-500 MG per tablet Take 1 tablet by mouth 2 (two) times daily with a meal.  180 tablet  3  . SUMAtriptan (IMITREX) 100 MG tablet Take 100 mg by mouth every 2 (two) hours as needed. For migraines      . SYMBICORT 160-4.5 MCG/ACT inhaler INHALE 2 PUFFS BY MOUTH TWICE DAILY  10.2 g  3  . traMADol (ULTRAM) 50 MG tablet Take 1-2 tablets (50-100 mg total) by mouth every 6 (six) hours as needed.  30 tablet  0  . zolpidem (AMBIEN CR) 12.5 MG CR tablet Take 12.5 mg by mouth at bedtime.       No current facility-administered medications on file prior to visit.   Review of Systems Constitutional: Negative for diaphoresis, activity change, appetite change or unexpected weight change.  HENT: Negative for hearing loss, ear pain, facial swelling, mouth sores and neck stiffness.   Eyes: Negative for pain, redness and visual disturbance.  Respiratory: Negative for shortness of breath and wheezing.   Cardiovascular: Negative for chest pain and palpitations.  Gastrointestinal: Negative for diarrhea, blood in stool, abdominal distention or other pain Genitourinary: Negative for hematuria, flank pain or change in urine volume.  Musculoskeletal: Negative for myalgias and joint swelling.  Skin: Negative for color change and wound.  Neurological: Negative for syncope and numbness. other than noted Hematological: Negative for adenopathy.  Psychiatric/Behavioral: Negative for hallucinations, self-injury, decreased concentration and  agitation.      Objective:   Physical Exam BP 104/68  Pulse 131  Temp(Src) 97.9 F (36.6 C) (Oral)  Ht 5\' 7"  (1.702 m)  Wt 340 lb 2 oz (154.28 kg)  BMI 53.26 kg/m2  SpO2 97%  LMP 07/17/2012 VS noted,  Constitutional: Pt is oriented to person, place, and time. Appears well-developed and well-nourished.  Head: Normocephalic and atraumatic.  Right Ear: External ear normal.  Left Ear: External ear normal.  Nose: Nose normal.  Mouth/Throat: Oropharynx is clear and moist.  Eyes: Conjunctivae and EOM are normal. Pupils are equal, round, and reactive to light.  Neck: Normal range of motion. Neck supple. No JVD  present. No tracheal deviation present.  Cardiovascular: Normal rate, regular rhythm, normal heart sounds and intact distal pulses.   Pulmonary/Chest: Effort normal and breath sounds normal.  Abdominal: Soft. Bowel sounds are normal. There is no tenderness. No HSM  Musculoskeletal: Normal range of motion. Exhibits no edema.  Lymphadenopathy:  Has no cervical adenopathy.  Neurological: Pt is alert and oriented to person, place, and time. Pt has normal reflexes. No cranial nerve deficit.  Skin: Skin is warm and dry. No rash noted.  Psychiatric: 2-3+ nervous. Behavior is normal.     Assessment & Plan:

## 2013-01-03 NOTE — Assessment & Plan Note (Signed)
To increase the zoloft to 200 mg per day, and add klonopin prn panic,  to f/u any worsening symptoms or concerns, for self referral to counseling

## 2013-01-03 NOTE — Patient Instructions (Addendum)
You had the tetanus shot today (Tdap) OK to incresae the zoloft to 200 mg per day Please take all new medication as prescribed - the klonopin as directed You are given the work note today Please go to the LAB in the Basement (turn left off the elevator) for the tests to be done today You will be contacted by phone if any changes need to be made immediately.  Otherwise, you will receive a letter about your results with an explanation, but please check with MyChart first. Please keep your appointments with your specialists as you have planned Thank you for enrolling in MyChart. Please follow the instructions below to securely access your online medical record. MyChart allows you to send messages to your doctor, view your test results, renew your prescriptions, schedule appointments, and more. Nicole Melendez with your Gastric Sleave surgury I think you should go for counseling as we discussed today Please return in 6 months, or sooner if needed, with Lab testing done 3-5 days before

## 2013-01-03 NOTE — Assessment & Plan Note (Signed)
stable overall by history and exam, recent data reviewed with pt, and pt to continue medical treatment as before,  to f/u any worsening symptoms or concerns Lab Results  Component Value Date   HGBA1C 6.8* 06/27/2012    

## 2013-01-03 NOTE — Assessment & Plan Note (Signed)

## 2013-01-04 ENCOUNTER — Other Ambulatory Visit: Payer: Self-pay | Admitting: Internal Medicine

## 2013-01-08 ENCOUNTER — Other Ambulatory Visit (INDEPENDENT_AMBULATORY_CARE_PROVIDER_SITE_OTHER): Payer: 59

## 2013-01-08 ENCOUNTER — Encounter: Payer: Self-pay | Admitting: Internal Medicine

## 2013-01-08 ENCOUNTER — Ambulatory Visit (INDEPENDENT_AMBULATORY_CARE_PROVIDER_SITE_OTHER): Payer: 59 | Admitting: Internal Medicine

## 2013-01-08 VITALS — BP 120/74 | HR 87 | Temp 97.9°F | Ht 67.0 in | Wt 346.0 lb

## 2013-01-08 DIAGNOSIS — D869 Sarcoidosis, unspecified: Secondary | ICD-10-CM

## 2013-01-08 DIAGNOSIS — E119 Type 2 diabetes mellitus without complications: Secondary | ICD-10-CM

## 2013-01-08 DIAGNOSIS — W57XXXA Bitten or stung by nonvenomous insect and other nonvenomous arthropods, initial encounter: Secondary | ICD-10-CM

## 2013-01-08 DIAGNOSIS — Z0000 Encounter for general adult medical examination without abnormal findings: Secondary | ICD-10-CM

## 2013-01-08 DIAGNOSIS — J0100 Acute maxillary sinusitis, unspecified: Secondary | ICD-10-CM | POA: Insufficient documentation

## 2013-01-08 DIAGNOSIS — J019 Acute sinusitis, unspecified: Secondary | ICD-10-CM

## 2013-01-08 LAB — HEPATIC FUNCTION PANEL
ALT: 19 U/L (ref 0–35)
Alkaline Phosphatase: 123 U/L — ABNORMAL HIGH (ref 39–117)
Bilirubin, Direct: 0.1 mg/dL (ref 0.0–0.3)
Bilirubin, Direct: 0.1 mg/dL (ref 0.0–0.3)
Total Bilirubin: 0.3 mg/dL (ref 0.3–1.2)
Total Protein: 7 g/dL (ref 6.0–8.3)

## 2013-01-08 LAB — CBC WITH DIFFERENTIAL/PLATELET
Basophils Absolute: 0 10*3/uL (ref 0.0–0.1)
Basophils Relative: 0.2 % (ref 0.0–3.0)
Basophils Relative: 0.2 % (ref 0.0–3.0)
Eosinophils Absolute: 0.1 10*3/uL (ref 0.0–0.7)
Eosinophils Absolute: 0.1 10*3/uL (ref 0.0–0.7)
Eosinophils Relative: 1.4 % (ref 0.0–5.0)
Lymphocytes Relative: 3.9 % — ABNORMAL LOW (ref 12.0–46.0)
Lymphocytes Relative: 4.9 % — ABNORMAL LOW (ref 12.0–46.0)
MCHC: 32.8 g/dL (ref 30.0–36.0)
MCHC: 33.1 g/dL (ref 30.0–36.0)
MCV: 76.5 fl — ABNORMAL LOW (ref 78.0–100.0)
Monocytes Absolute: 0.4 10*3/uL (ref 0.1–1.0)
Neutrophils Relative %: 89.8 % — ABNORMAL HIGH (ref 43.0–77.0)
Neutrophils Relative %: 91.4 % — ABNORMAL HIGH (ref 43.0–77.0)
Platelets: 346 10*3/uL (ref 150.0–400.0)
RBC: 4.63 Mil/uL (ref 3.87–5.11)
RBC: 4.68 Mil/uL (ref 3.87–5.11)
RDW: 20.4 % — ABNORMAL HIGH (ref 11.5–14.6)
WBC: 10 10*3/uL (ref 4.5–10.5)

## 2013-01-08 LAB — TSH: TSH: 1.01 u[IU]/mL (ref 0.35–5.50)

## 2013-01-08 LAB — BASIC METABOLIC PANEL
CO2: 25 mEq/L (ref 19–32)
Chloride: 99 mEq/L (ref 96–112)
Creatinine, Ser: 0.8 mg/dL (ref 0.4–1.2)

## 2013-01-08 LAB — LIPID PANEL
Cholesterol: 165 mg/dL (ref 0–200)
Total CHOL/HDL Ratio: 4
Triglycerides: 107 mg/dL (ref 0.0–149.0)

## 2013-01-08 MED ORDER — LEVOFLOXACIN 250 MG PO TABS: 250.0000 mg | ORAL_TABLET | Freq: Every day | ORAL | Status: DC

## 2013-01-08 NOTE — Assessment & Plan Note (Signed)
Mild to mod, for antibx course,  to f/u any worsening symptoms or concerns 

## 2013-01-08 NOTE — Assessment & Plan Note (Signed)
For topical benadryl

## 2013-01-08 NOTE — Progress Notes (Signed)
Subjective:    Patient ID: Nicole Melendez, female    DOB: Feb 02, 1976, 37 y.o.   MRN: [id number]  HPI  Here for wellness and f/u;  Overall doing ok;  Pt denies CP, worsening SOB, DOE, wheezing, orthopnea, PND, worsening LE edema, palpitations, dizziness or syncope.  Pt denies neurological change such as new headache, facial or extremity weakness.  Pt denies polydipsia, polyuria, or low sugar symptoms. Pt states overall good compliance with treatment and medications, good tolerability, and has been trying to follow lower cholesterol diet.  Pt denies worsening depressive symptoms, suicidal ideation or panic. No fever, night sweats, wt loss, loss of appetite, or other constitutional symptoms.  Pt states good ability with ADL's, has low fall risk, home safety reviewed and adequate, no other significant changes in hearing or vision, and only occasionally active with exercise.  Also,  Here with 2-3 days acute onset fever, facial pain, pressure, headache, general weakness and malaise, and greenish d/c, with mild ST and cough. Past Medical History  Diagnosis Date  . Morbid obesity   . Peptic ulcer disease   . GERD (gastroesophageal reflux disease)   . Migraine   . Anxiety   . Anemia   . Hyperlipidemia   . Menorrhagia   . PNA (pneumonia) july 2011  . Diabetes mellitus type II     steroid related  . Depression   . Sarcoid     including hand per rheumatology-Dr. Dierdre Forth  . Allergic rhinitis, cause unspecified   . Varicose veins with pain   . Positive ANA (antinuclear antibody) 02/14/2012  . Shortness of breath     on exertion  . History of blood transfusion   . Sarcoidosis of lung    Past Surgical History  Procedure Laterality Date  . Uterine ablation  03/2010  . Wisdom tooth extraction    . Abdominal hysterectomy      reports that she quit smoking about 2 years ago. Her smoking use included Cigarettes. She has a 20 pack-year smoking history. She does not have any smokeless tobacco history on  file. She reports that she does not drink alcohol or use illicit drugs. family history includes Allergies in her mother; Bone cancer in her maternal aunt; Breast cancer in her maternal aunt; Diabetes in an unspecified family member; Heart attack in her mother; Heart disease in her father; Hypertension in an unspecified family member; Lung cancer in her maternal aunt; Ovarian cancer in her maternal aunt; and Rheum arthritis in her father.  There is no history of Other. Allergies  Allergen Reactions  . Azithromycin     (z pak) hives  . Oxycodone-Acetaminophen     REACTION: difficulty breathing  . Penicillins     REACTION: swelling and difficulty breathing   Current Outpatient Prescriptions on File Prior to Visit  Medication Sig Dispense Refill  . acetaminophen (TYLENOL) 500 MG tablet Take 1,000 mg by mouth every 6 (six) hours as needed. For pain      . ALPRAZolam (XANAX) 0.5 MG tablet TAKE 1 TABLET BY MOUTH THREE TIMES DAILY AS NEEDED FOR ANXIETY  90 tablet  01  . B-D UF III MINI PEN NEEDLES 31G X 5 MM MISC USE FOUR TIMES DAILY AS NEEDED FOR SLIDING SCALE  200 each  0  . CELEBREX 200 MG capsule Take 200 mg by mouth daily as needed. For pain      . cetirizine (ZYRTEC) 10 MG tablet Take 10 mg by mouth daily as needed. For allergies      .  clonazePAM (KLONOPIN) 1 MG tablet Take 1 tablet (1 mg total) by mouth 2 (two) times daily as needed for anxiety.  60 tablet  2  . cyclobenzaprine (FLEXERIL) 5 MG tablet Take 5 mg by mouth daily as needed. For spasms      . fluticasone (FLONASE) 50 MCG/ACT nasal spray Place 2 sprays into the nose daily as needed. For allergies      . folic acid (FOLVITE) 1 MG tablet Take 1 mg by mouth daily.      . furosemide (LASIX) 20 MG tablet Take 20 mg by mouth daily as needed. For fluid retention      . glucose blood (ONE TOUCH ULTRA TEST) test strip Use as instructed  200 each  11  . insulin aspart (NOVOLOG FLEXPEN) 100 UNIT/ML injection Only as needed  3 mL  6  .  methotrexate (RHEUMATREX) 2.5 MG tablet TAKE 4 TABLETS BY MOUTH ONCE A WEEK  16 tablet  0  . nystatin (MYCOSTATIN) 100000 UNIT/ML suspension TAKE 4 ML BY MOUTH THREE TIMES DAILY . SWISH AND SWALLOW AS DIRECTED  120 mL  0  . omeprazole (PRILOSEC) 20 MG capsule Take 20 mg by mouth daily.      Letta Pate DELICA LANCETS MISC USE TO TEST FOUR TIMES DAILY  200 each  11  . predniSONE (DELTASONE) 5 MG tablet Take 5 mg by mouth daily.      . sertraline (ZOLOFT) 100 MG tablet 2 tabs by mouth per day  180 tablet  3  . sitaGLIPtan-metformin (JANUMET) 50-500 MG per tablet Take 1 tablet by mouth 2 (two) times daily with a meal.  180 tablet  3  . SUMAtriptan (IMITREX) 100 MG tablet Take 100 mg by mouth every 2 (two) hours as needed. For migraines      . SYMBICORT 160-4.5 MCG/ACT inhaler INHALE 2 PUFFS BY MOUTH TWICE DAILY  10.2 g  3  . traMADol (ULTRAM) 50 MG tablet Take 1-2 tablets (50-100 mg total) by mouth every 6 (six) hours as needed.  30 tablet  0  . zolpidem (AMBIEN CR) 12.5 MG CR tablet Take 12.5 mg by mouth at bedtime.       No current facility-administered medications on file prior to visit.   Review of Systems Constitutional: Negative for diaphoresis, activity change, appetite change or unexpected weight change.  HENT: Negative for hearing loss, ear pain, facial swelling, mouth sores and neck stiffness.   Eyes: Negative for pain, redness and visual disturbance.  Respiratory: Negative for shortness of breath and wheezing.   Cardiovascular: Negative for chest pain and palpitations.  Gastrointestinal: Negative for diarrhea, blood in stool, abdominal distention or other pain Genitourinary: Negative for hematuria, flank pain or change in urine volume.  Musculoskeletal: Negative for myalgias and joint swelling.  Skin: Negative for color change and wound.  Neurological: Negative for syncope and numbness. other than noted Hematological: Negative for adenopathy.  Psychiatric/Behavioral: Negative for  hallucinations, self-injury, decreased concentration and agitation.      Objective:   Physical Exam BP 120/74  Pulse 87  Temp(Src) 97.9 F (36.6 C) (Oral)  Ht 5\' 7"  (1.702 m)  Wt 346 lb (156.945 kg)  BMI 54.18 kg/m2  SpO2 97%  LMP 07/17/2012 VS noted,  Constitutional: Pt is oriented to person, place, and time. Appears well-developed and well-nourished.  Head: Normocephalic and atraumatic.  Right Ear: External ear normal.  Left Ear: External ear normal.  Nose: Nose normal.  Mouth/Throat: Oropharynx is clear and moist.  Bilat  tm's with mild erythema.  Max sinus areas mild tender.  Pharynx with mild erythema, no exudate Eyes: Conjunctivae and EOM are normal. Pupils are equal, round, and reactive to light.  Neck: Normal range of motion. Neck supple. No JVD present. No tracheal deviation present.  Cardiovascular: Normal rate, regular rhythm, normal heart sounds and intact distal pulses.   Pulmonary/Chest: Effort normal and breath sounds normal.  Abdominal: Soft. Bowel sounds are normal. There is no tenderness. No HSM  Musculoskeletal: Normal range of motion. Exhibits no edema.  Lymphadenopathy:  Has no cervical adenopathy.  Neurological: Pt is alert and oriented to person, place, and time. Pt has normal reflexes. No cranial nerve deficit.  Skin: Skin is warm and dry. No rash noted. Has several bedbug bites to arms Psychiatric:  Has  normal mood and affect. Behavior is normal.     Assessment & Plan:

## 2013-01-08 NOTE — Assessment & Plan Note (Signed)

## 2013-01-08 NOTE — Patient Instructions (Addendum)
Please take all new medication as prescribed - the antibiotic You can also take Delsym OTC for cough, and/or Mucinex (or it's generic off brand) for congestion, and tylenol as needed for pain, as well as sudafed for congestion Please continue the pest control efforts You are given the work note today Please go to the LAB in the Basement (turn left off the elevator) for the tests to be done today You will be contacted by phone if any changes need to be made immediately.  Otherwise, you will receive a letter about your results with an explanation, but please check with MyChart first. Thank you for enrolling in MyChart. Please follow the instructions below to securely access your online medical record. MyChart allows you to send messages to your doctor, view your test results, renew your prescriptions, schedule appointments, and more.

## 2013-01-08 NOTE — Assessment & Plan Note (Signed)
stable overall by history and exam, recent data reviewed with pt, and pt to continue medical treatment as before,  to f/u any worsening symptoms or concerns Lab Results  Component Value Date   HGBA1C 6.8* 06/27/2012

## 2013-01-09 ENCOUNTER — Encounter: Payer: Self-pay | Admitting: *Deleted

## 2013-01-09 ENCOUNTER — Other Ambulatory Visit: Payer: Self-pay | Admitting: Pulmonary Disease

## 2013-01-11 LAB — URINALYSIS, ROUTINE W REFLEX MICROSCOPIC
Leukocytes, UA: NEGATIVE
Nitrite: NEGATIVE
Urobilinogen, UA: 0.2 (ref 0.0–1.0)

## 2013-01-11 LAB — MICROALBUMIN / CREATININE URINE RATIO
Creatinine,U: 111.7 mg/dL
Microalb, Ur: 0.7 mg/dL (ref 0.0–1.9)

## 2013-01-15 ENCOUNTER — Encounter: Payer: Self-pay | Admitting: Internal Medicine

## 2013-01-15 ENCOUNTER — Ambulatory Visit (INDEPENDENT_AMBULATORY_CARE_PROVIDER_SITE_OTHER): Payer: 59 | Admitting: Internal Medicine

## 2013-01-15 VITALS — BP 126/82 | HR 108 | Temp 97.7°F | Ht 67.0 in | Wt 343.0 lb

## 2013-01-15 DIAGNOSIS — B379 Candidiasis, unspecified: Secondary | ICD-10-CM

## 2013-01-15 DIAGNOSIS — J019 Acute sinusitis, unspecified: Secondary | ICD-10-CM

## 2013-01-15 DIAGNOSIS — B49 Unspecified mycosis: Secondary | ICD-10-CM

## 2013-01-15 DIAGNOSIS — T3695XA Adverse effect of unspecified systemic antibiotic, initial encounter: Secondary | ICD-10-CM

## 2013-01-15 MED ORDER — AMOXICILLIN-POT CLAVULANATE 875-125 MG PO TABS: 1.0000 | ORAL_TABLET | Freq: Two times a day (BID) | ORAL | Status: DC

## 2013-01-15 MED ORDER — HYDROCODONE-HOMATROPINE 5-1.5 MG/5ML PO SYRP: 5.0000 mL | ORAL_SOLUTION | Freq: Three times a day (TID) | ORAL | Status: DC | PRN

## 2013-01-15 MED ORDER — FLUCONAZOLE 150 MG PO TABS: 150.0000 mg | ORAL_TABLET | Freq: Once | ORAL | Status: DC

## 2013-01-15 NOTE — Progress Notes (Signed)
HPI  Pt presents to the clinic today with 3 week history of nasal congestions, sinus pain and pressure, headache, fatigue and fever. She saw Dr. Jonny Ruiz on 3/17 for the same. She was diagnosed with acute sinuitis and given a course of Levaquin. She does not feel any better. The pain continues in her head and face. She has finished the course of antibiotics and been through a whole bottle of robitussin DM. She has continue to ha low grade fevers at night, controlled with tylenol. She does have a history of allergies and sarcoidosis. She has had sick contacts.  Review of Systems    Past Medical History  Diagnosis Date  . Morbid obesity   . Peptic ulcer disease   . GERD (gastroesophageal reflux disease)   . Migraine   . Anxiety   . Anemia   . Hyperlipidemia   . Menorrhagia   . PNA (pneumonia) july 2011  . Diabetes mellitus type II     steroid related  . Depression   . Sarcoid     including hand per rheumatology-Dr. Dierdre Forth  . Allergic rhinitis, cause unspecified   . Varicose veins with pain   . Positive ANA (antinuclear antibody) 02/14/2012  . Shortness of breath     on exertion  . History of blood transfusion   . Sarcoidosis of lung     Family History  Problem Relation Age of Onset  . Diabetes    . Hypertension    . Allergies Mother   . Heart attack Mother   . Heart disease Father   . Rheum arthritis Father   . Ovarian cancer Maternal Aunt   . Lung cancer Maternal Aunt   . Breast cancer Maternal Aunt   . Bone cancer Maternal Aunt   . Other Neg Hx     History   Social History  . Marital Status: Single    Spouse Name: N/A    Number of Children: 0  . Years of Education: N/A   Occupational History  . tobacco worker    Social History Main Topics  . Smoking status: Former Smoker -- 1.00 packs/day for 20 years    Types: Cigarettes    Quit date: 04/24/2010  . Smokeless tobacco: Not on file     Comment: less than 1 ppd.  started at age 50.    Marland Kitchen Alcohol Use: No  .  Drug Use: No  . Sexually Active: Yes   Other Topics Concern  . Not on file   Social History Narrative   Pt lives with Dalbert Batman- also pt of [facility]    Allergies  Allergen Reactions  . Azithromycin     (z pak) hives  . Oxycodone-Acetaminophen     REACTION: difficulty breathing  . Penicillins     REACTION: swelling and difficulty breathing     Constitutional: Positive headache, fatigue and fever. Denies abrupt weight changes.  HEENT:  Positive eye pain, pressure behind the eyes, facial pain, nasal congestion and sore throat. Denies eye redness, ear pain, ringing in the ears, wax buildup, runny nose or bloody nose. Respiratory: Positive cough. Denies sputum production, difficulty breathing or shortness of breath.  Cardiovascular: Denies chest pain, chest tightness, palpitations or swelling in the hands or feet.   No other specific complaints in a complete review of systems (except as listed in HPI above).  Objective:    BP 126/82  Pulse 108  Temp(Src) 97.7 F (36.5 C) (Oral)  Ht 5\' 7"  (1.702 m)  Wt  343 lb (155.584 kg)  BMI 53.71 kg/m2  SpO2 98%  LMP 07/17/2012 Wt Readings from Last 3 Encounters:  01/15/13 343 lb (155.584 kg)  01/08/13 346 lb (156.945 kg)  01/03/13 340 lb 2 oz (154.28 kg)    General: Appears her stated age, well developed, well nourished in NAD. HEENT: Head: normal shape and size, sinuses tender to palpation; Eyes: sclera white, no icterus, conjunctiva pink, PERRLA and EOMs intact; Ears: Tm's gray and intact, normal light reflex; Nose: mucosa pink and moist, septum midline; Throat/Mouth: + PND. Teeth present, mucosa pink and moist, no exudate noted, no lesions or ulcerations noted.  Neck: Mild cervical lymphadenopathy. Neck supple, trachea midline. No massses, lumps or thyromegaly present.  Cardiovascular: Normal rate and rhythm. S1,S2 noted.  No murmur, rubs or gallops noted. No JVD or BLE edema. No carotid bruits noted. Pulmonary/Chest: Normal effort  and positive vesicular breath sounds. No respiratory distress. No wheezes, rales or ronchi noted.      Assessment & Plan:   Acute bacterial sinusitis  Can use a Neti Pot which can be purchased from your local drug store. Flonase 2 sprays each nostril for 3 days and then as needed. Augmentin BID for 10 days Hycodan cough syrup for night time Pt requests diflucan for antibiotic induced yeast infection  RTC as needed or if symptoms persist.

## 2013-01-15 NOTE — Patient Instructions (Signed)

## 2013-01-16 DIAGNOSIS — Z0279 Encounter for issue of other medical certificate: Secondary | ICD-10-CM

## 2013-01-22 ENCOUNTER — Encounter: Payer: Self-pay | Admitting: *Deleted

## 2013-01-22 ENCOUNTER — Encounter: Payer: Self-pay | Admitting: Internal Medicine

## 2013-01-22 ENCOUNTER — Ambulatory Visit (INDEPENDENT_AMBULATORY_CARE_PROVIDER_SITE_OTHER): Payer: 59 | Admitting: Internal Medicine

## 2013-01-22 VITALS — BP 124/82 | HR 102 | Temp 97.2°F | Ht 67.0 in | Wt 343.0 lb

## 2013-01-22 DIAGNOSIS — R300 Dysuria: Secondary | ICD-10-CM

## 2013-01-22 DIAGNOSIS — M549 Dorsalgia, unspecified: Secondary | ICD-10-CM

## 2013-01-22 LAB — POCT URINALYSIS DIPSTICK
Glucose, UA: NEGATIVE
Ketones, UA: NEGATIVE
Spec Grav, UA: 1.025

## 2013-01-22 MED ORDER — KETOROLAC TROMETHAMINE 60 MG/2ML IM SOLN
30.0000 mg | Freq: Once | INTRAMUSCULAR | Status: AC
  Administered 2013-01-22: 30 mg via INTRAMUSCULAR

## 2013-01-22 NOTE — Progress Notes (Signed)
HPI  Pt presents to the clinic today with c/o back pain. She is concerned that she has a kidney stone. She has pain in her left flank. She has not noticed any blood in her urine. She does have some burning with urination. She is currently on Levaquin for a sinus infection. She has been having fevers, no chills or body aches. She has not vaginal discharge that is concerning. She has no history of kidney stones in the past.   Review of Systems  Past Medical History  Diagnosis Date  . Morbid obesity   . Peptic ulcer disease   . GERD (gastroesophageal reflux disease)   . Migraine   . Anxiety   . Anemia   . Hyperlipidemia   . Menorrhagia   . PNA (pneumonia) july 2011  . Diabetes mellitus type II     steroid related  . Depression   . Sarcoid     including hand per rheumatology-Dr. Dierdre Forth  . Allergic rhinitis, cause unspecified   . Varicose veins with pain   . Positive ANA (antinuclear antibody) 02/14/2012  . Shortness of breath     on exertion  . History of blood transfusion   . Sarcoidosis of lung     Family History  Problem Relation Age of Onset  . Diabetes    . Hypertension    . Allergies Mother   . Heart attack Mother   . Heart disease Father   . Rheum arthritis Father   . Ovarian cancer Maternal Aunt   . Lung cancer Maternal Aunt   . Breast cancer Maternal Aunt   . Bone cancer Maternal Aunt   . Other Neg Hx     History   Social History  . Marital Status: Single    Spouse Name: N/A    Number of Children: 0  . Years of Education: N/A   Occupational History  . tobacco worker    Social History Main Topics  . Smoking status: Former Smoker -- 1.00 packs/day for 20 years    Types: Cigarettes    Quit date: 04/24/2010  . Smokeless tobacco: Not on file     Comment: less than 1 ppd.  started at age 88.    Marland Kitchen Alcohol Use: No  . Drug Use: No  . Sexually Active: Yes   Other Topics Concern  . Not on file   Social History Narrative   Pt lives with Dalbert Batman-  also pt of [facility]    Allergies  Allergen Reactions  . Azithromycin     (z pak) hives  . Oxycodone-Acetaminophen     REACTION: difficulty breathing  . Penicillins     REACTION: swelling and difficulty breathing    Constitutional: Pt reports fever. Denies fever, malaise, fatigue, headache or abrupt weight changes.   GU: Pt reports back pain. Denies urgency, frequency, pain with urination, burning sensation, blood in urine, odor or discharge. Skin: Denies redness, rashes, lesions or ulcercations.   No other specific complaints in a complete review of systems (except as listed in HPI above).    Objective:   Physical Exam  BP 124/82  Pulse 102  Temp(Src) 97.2 F (36.2 C) (Oral)  Ht 5\' 7"  (1.702 m)  Wt 343 lb (155.584 kg)  BMI 53.71 kg/m2  SpO2 95%  LMP 07/17/2012 Wt Readings from Last 3 Encounters:  01/22/13 343 lb (155.584 kg)  01/15/13 343 lb (155.584 kg)  01/08/13 346 lb (156.945 kg)    General: Appears her stated age, obese,  but well developed, well nourished in NAD. Cardiovascular: Normal rate and rhythm. S1,S2 noted.  No murmur, rubs or gallops noted. No JVD or BLE edema. No carotid bruits noted. Pulmonary/Chest: Normal effort and positive vesicular breath sounds. No respiratory distress. No wheezes, rales or ronchi noted.  Abdomen: Soft and nontender. Normal bowel sounds, no bruits noted. No distention or masses noted. Liver, spleen and kidneys non palpable. Tender to palpation over the bladder area. No CVA tenderness.      Assessment & Plan:   Flank pain with dysuria, new onset:  Urinalysis, showed blood and leuks- continue Levaquin at this time Will give 30 mg toradol for back pain If no improvement with abx, may consider CT scan to r/o kidney stones  RTC as needed or if symptoms persist.

## 2013-01-22 NOTE — Patient Instructions (Signed)
Dysuria  Dysuria is the medical term for pain with urination. There are many causes for dysuria, but urinary tract infection is the most common. If a urinalysis was performed it can show that there is a urinary tract infection. A urine culture confirms that you or your child is sick. You will need to follow up with a healthcare provider because:  · If a urine culture was done you will need to know the culture results and treatment recommendations.  · If the urine culture was positive, you or your child will need to be put on antibiotics or know if the antibiotics prescribed are the right antibiotics for your urinary tract infection.  · If the urine culture is negative (no urinary tract infection), then other causes may need to be explored or antibiotics need to be stopped.  Today laboratory work may have been done and there does not seem to be an infection. If cultures were done they will take at least 24 to 48 hours to be completed.  Today x-rays may have been taken and they read as normal. No cause can be found for the problems. The x-rays may be re-read by a radiologist and you will be contacted if additional findings are made.  You or your child may have been put on medications to help with this problem until you can see your primary caregiver. If the problems get better, see your primary caregiver if the problems return. If you were given antibiotics (medications which kill germs), take all of the mediations as directed for the full course of treatment.   If laboratory work was done, you need to find the results. Leave a telephone number where you can be reached. If this is not possible, make sure you find out how you are to get test results.  HOME CARE INSTRUCTIONS   · Drink lots of fluids. For adults, drink eight, 8 ounce glasses of clear juice or water a day. For children, replace fluids as suggested by your caregiver.  · Empty the bladder often. Avoid holding urine for long periods of time.  · After a bowel  movement, women should cleanse front to back, using each tissue only once.  · Empty your bladder before and after sexual intercourse.  · Take all the medicine given to you until it is gone. You may feel better in a few days, but TAKE ALL MEDICINE.  · Avoid caffeine, tea, alcohol and carbonated beverages, because they tend to irritate the bladder.  · In men, alcohol may irritate the prostate.  · Only take over-the-counter or prescription medicines for pain, discomfort, or fever as directed by your caregiver.  · If your caregiver has given you a follow-up appointment, it is very important to keep that appointment. Not keeping the appointment could result in a chronic or permanent injury, pain, and disability. If there is any problem keeping the appointment, you must call back to this facility for assistance.  SEEK IMMEDIATE MEDICAL CARE IF:   · Back pain develops.  · A fever develops.  · There is nausea (feeling sick to your stomach) or vomiting (throwing up).  · Problems are no better with medications or are getting worse.  MAKE SURE YOU:   · Understand these instructions.  · Will watch your condition.  · Will get help right away if you are not doing well or get worse.  Document Released: 07/09/2004 Document Revised: 01/03/2012 Document Reviewed: 05/16/2008  [employer]® Patient Information ©2013 [employer].

## 2013-01-24 ENCOUNTER — Encounter: Payer: Self-pay | Admitting: Internal Medicine

## 2013-01-24 ENCOUNTER — Other Ambulatory Visit (INDEPENDENT_AMBULATORY_CARE_PROVIDER_SITE_OTHER): Payer: 59

## 2013-01-24 ENCOUNTER — Ambulatory Visit (INDEPENDENT_AMBULATORY_CARE_PROVIDER_SITE_OTHER)
Admission: RE | Admit: 2013-01-24 | Discharge: 2013-01-24 | Disposition: A | Discharge status: Home / Self Care | Payer: 59 | Source: Ambulatory Visit | Attending: Internal Medicine | Admitting: Internal Medicine

## 2013-01-24 ENCOUNTER — Ambulatory Visit (INDEPENDENT_AMBULATORY_CARE_PROVIDER_SITE_OTHER): Payer: 59 | Admitting: Internal Medicine

## 2013-01-24 VITALS — BP 112/70 | HR 78 | Temp 97.8°F | Ht 67.0 in | Wt 343.2 lb

## 2013-01-24 DIAGNOSIS — R300 Dysuria: Secondary | ICD-10-CM

## 2013-01-24 DIAGNOSIS — R109 Unspecified abdominal pain: Secondary | ICD-10-CM

## 2013-01-24 DIAGNOSIS — E119 Type 2 diabetes mellitus without complications: Secondary | ICD-10-CM

## 2013-01-24 LAB — URINALYSIS, ROUTINE W REFLEX MICROSCOPIC
Nitrite: NEGATIVE
Total Protein, Urine: NEGATIVE
Urobilinogen, UA: 0.2 (ref 0.0–1.0)

## 2013-01-24 MED ORDER — IOHEXOL 300 MG/ML  SOLN
100.0000 mL | Freq: Once | INTRAMUSCULAR | Status: AC | PRN
  Administered 2013-01-24: 100 mL via INTRAVENOUS

## 2013-01-24 MED ORDER — SULFAMETHOXAZOLE-TRIMETHOPRIM 800-160 MG PO TABS: 1.0000 | ORAL_TABLET | Freq: Two times a day (BID) | ORAL | Status: DC

## 2013-01-24 NOTE — Assessment & Plan Note (Addendum)
Also markedly tender to lower right abd/mid low abd - could be GU related but also cant r/o acute appendix, ovarian dz, or abscess; for CT with contrast now; had BMET with cr 0.8 on mar 27

## 2013-01-24 NOTE — Assessment & Plan Note (Signed)
stable overall by history and exam, recent data reviewed with pt, and pt to continue medical treatment as before,  to f/u any worsening symptoms or concerns Lab Results  Component Value Date   HGBA1C 6.1 01/08/2013    

## 2013-01-24 NOTE — Progress Notes (Signed)
Subjective:    Patient ID: Nicole Melendez, female    DOB: 10-29-1975, 37 y.o.   MRN: [id number]  HPI   Here with 3-4 day lower abd pain and dysuria, with urinary freq with small amounts, no blood, seen and tx for UTI per NP mar 31 with oral levaquin after exam and abnormal Udip; has been taking the levaquin asd, but unfortunately pain has persisted and worsened now mod to severe to low mid abd and RLQ with persistent dysuria/frequency, now with pain also worsening to lower back as well as bilat flank areas.  States temp 101 last night, not as high this AM, no chills, has some myalgias as well.  No n/v, constipation. Still has appendix but no other hx of GYN prior issues.  Here with female significant other, no hx of STD, vaginal d/c.   Pt denies polydipsia, polyuria, or low sugar symptoms  Pt states overall good compliance with meds, trying to follow lower cholesterol, diabetic diet. Past Medical History  Diagnosis Date  . Morbid obesity   . Peptic ulcer disease   . GERD (gastroesophageal reflux disease)   . Migraine   . Anxiety   . Anemia   . Hyperlipidemia   . Menorrhagia   . PNA (pneumonia) july 2011  . Diabetes mellitus type II     steroid related  . Depression   . Sarcoid     including hand per rheumatology-Dr. Dierdre Forth  . Allergic rhinitis, cause unspecified   . Varicose veins with pain   . Positive ANA (antinuclear antibody) 02/14/2012  . Shortness of breath     on exertion  . History of blood transfusion   . Sarcoidosis of lung    Past Surgical History  Procedure Laterality Date  . Uterine ablation  03/2010  . Wisdom tooth extraction    . Abdominal hysterectomy      reports that she quit smoking about 2 years ago. Her smoking use included Cigarettes. She has a 20 pack-year smoking history. She does not have any smokeless tobacco history on file. She reports that she does not drink alcohol or use illicit drugs. family history includes Allergies in her mother; Bone cancer in  her maternal aunt; Breast cancer in her maternal aunt; Diabetes in an unspecified family member; Heart attack in her mother; Heart disease in her father; Hypertension in an unspecified family member; Lung cancer in her maternal aunt; Ovarian cancer in her maternal aunt; and Rheum arthritis in her father.  There is no history of Other. Allergies  Allergen Reactions  . Azithromycin     (z pak) hives  . Oxycodone-Acetaminophen     REACTION: difficulty breathing  . Penicillins     REACTION: swelling and difficulty breathing   Current Outpatient Prescriptions on File Prior to Visit  Medication Sig Dispense Refill  . acetaminophen (TYLENOL) 500 MG tablet Take 1,000 mg by mouth every 6 (six) hours as needed. For pain      . ALPRAZolam (XANAX) 0.5 MG tablet TAKE 1 TABLET BY MOUTH THREE TIMES DAILY AS NEEDED FOR ANXIETY  90 tablet  01  . B-D UF III MINI PEN NEEDLES 31G X 5 MM MISC USE FOUR TIMES DAILY AS NEEDED FOR SLIDING SCALE  200 each  0  . CELEBREX 200 MG capsule Take 200 mg by mouth daily as needed. For pain      . cetirizine (ZYRTEC) 10 MG tablet Take 10 mg by mouth daily as needed. For allergies      .  clonazePAM (KLONOPIN) 1 MG tablet Take 1 tablet (1 mg total) by mouth 2 (two) times daily as needed for anxiety.  60 tablet  2  . cyclobenzaprine (FLEXERIL) 5 MG tablet Take 5 mg by mouth daily as needed. For spasms      . fluconazole (DIFLUCAN) 150 MG tablet Take 1 tablet (150 mg total) by mouth once.  1 tablet  1  . fluticasone (FLONASE) 50 MCG/ACT nasal spray Place 2 sprays into the nose daily as needed. For allergies      . folic acid (FOLVITE) 1 MG tablet Take 1 mg by mouth daily.      . furosemide (LASIX) 20 MG tablet Take 20 mg by mouth daily as needed. For fluid retention      . glucose blood (ONE TOUCH ULTRA TEST) test strip Use as instructed  200 each  11  . HYDROcodone-homatropine (HYCODAN) 5-1.5 MG/5ML syrup Take 5 mLs by mouth every 8 (eight) hours as needed for cough.  120 mL  0   . insulin aspart (NOVOLOG FLEXPEN) 100 UNIT/ML injection Only as needed  3 mL  6  . methotrexate (RHEUMATREX) 2.5 MG tablet TAKE 4 TABLETS BY MOUTH ONCE A WEEK  16 tablet  0  . nystatin (MYCOSTATIN) 100000 UNIT/ML suspension TAKE 4 ML BY MOUTH THREE TIMES DAILY . SWISH AND SWALLOW AS DIRECTED  120 mL  0  . omeprazole (PRILOSEC) 20 MG capsule Take 20 mg by mouth daily.      Letta Pate DELICA LANCETS MISC USE TO TEST FOUR TIMES DAILY  200 each  11  . predniSONE (DELTASONE) 5 MG tablet Take 5 mg by mouth daily.      . sertraline (ZOLOFT) 100 MG tablet 2 tabs by mouth per day  180 tablet  3  . sitaGLIPtan-metformin (JANUMET) 50-500 MG per tablet Take 1 tablet by mouth 2 (two) times daily with a meal.  180 tablet  3  . SUMAtriptan (IMITREX) 100 MG tablet Take 100 mg by mouth every 2 (two) hours as needed. For migraines      . SYMBICORT 160-4.5 MCG/ACT inhaler INHALE 2 PUFFS BY MOUTH TWICE DAILY  10.2 g  3  . traMADol (ULTRAM) 50 MG tablet Take 1-2 tablets (50-100 mg total) by mouth every 6 (six) hours as needed.  30 tablet  0  . zolpidem (AMBIEN CR) 12.5 MG CR tablet Take 12.5 mg by mouth at bedtime.       No current facility-administered medications on file prior to visit.    Review of Systems  Constitutional: Negative for unexpected weight change, or unusual diaphoresis  HENT: Negative for tinnitus.   Eyes: Negative for photophobia and visual disturbance.  Respiratory: Negative for choking and stridor.   Gastrointestinal: Negative for vomiting and blood in stool.  Genitourinary: Negative for hematuria and decreased urine volume.  Musculoskeletal: Negative for acute joint swelling Skin: Negative for color change and wound.  Neurological: Negative for tremors and numbness other than noted  Psychiatric/Behavioral: Negative for decreased concentration or  hyperactivity.  '    Objective:   Physical Exam BP 112/70  Pulse 78  Temp(Src) 97.8 F (36.6 C) (Oral)  Ht 5\' 7"  (1.702 m)  Wt 343 lb  4 oz (155.697 kg)  BMI 53.75 kg/m2  SpO2 97%  LMP 07/17/2012 VS noted, at least mild ill appearing Constitutional: Pt appears well-developed and well-nourished.  HENT: Head: NCAT.  Right Ear: External ear normal.  Left Ear: External ear normal.  Eyes: Conjunctivae and  EOM are normal. Pupils are equal, round, and reactive to light.  Neck: Normal range of motion. Neck supple.  Cardiovascular: Normal rate and regular rhythm.   Pulmonary/Chest: Effort normal and breath sounds normal.  Abd:  Soft, non-distended, + BS but with moderate tender to mid low abd and rlq, without guarding; has bilat flank tender per pt Neurological: Pt is alert. Not confused , motor intact, dtr symmetric Spine tender in midline approx l4 or l5 but no swelling/red/rash and she thinks this happened this AM with bending forward to put on shoes Skin: Skin is warm. No erythema.  Psychiatric: Pt behavior is normal. Thought content normal. mild nervous    Assessment & Plan:

## 2013-01-24 NOTE — Assessment & Plan Note (Signed)
Persists after abnormal Udip and levaquin x 3 days, cx not done at the time, for urine studies today, and change levaquin for possible resistant infection

## 2013-01-24 NOTE — Patient Instructions (Addendum)
Your specimen will be sent for culture today OK to stop the levaquin as it may not be working well Please take all new medication as prescribed  - the Septra DS (sulfa antibiotic) Please continue all other medications as before Please have the pharmacy call with any other refills you may need. You will be contacted regarding the referral for: CT abd/pelvis - please see PCC's now - to rule out appendix and abscess You are given the work note

## 2013-01-28 ENCOUNTER — Ambulatory Visit (INDEPENDENT_AMBULATORY_CARE_PROVIDER_SITE_OTHER): Payer: 59 | Admitting: Internal Medicine

## 2013-01-28 VITALS — BP 133/81 | HR 122 | Temp 98.5°F | Resp 17 | Ht 67.0 in | Wt 336.0 lb

## 2013-01-28 DIAGNOSIS — K5732 Diverticulitis of large intestine without perforation or abscess without bleeding: Secondary | ICD-10-CM

## 2013-01-28 DIAGNOSIS — E669 Obesity, unspecified: Secondary | ICD-10-CM

## 2013-01-28 DIAGNOSIS — R1032 Left lower quadrant pain: Secondary | ICD-10-CM

## 2013-01-28 DIAGNOSIS — R509 Fever, unspecified: Secondary | ICD-10-CM

## 2013-01-28 LAB — POCT CBC
Lymph, poc: 1.1 (ref 0.6–3.4)
MCHC: 31.4 g/dL — AB (ref 31.8–35.4)
MPV: 7.5 fL (ref 0–99.8)
POC Granulocyte: 7 — AB (ref 2–6.9)
POC LYMPH PERCENT: 13.2 %L (ref 10–50)
POC MID %: 5.8 %M (ref 0–12)
RDW, POC: 19.5 %

## 2013-01-28 LAB — LIPASE: Lipase: 65 U/L (ref 0–75)

## 2013-01-28 LAB — POCT URINALYSIS DIPSTICK
Glucose, UA: NEGATIVE
Nitrite, UA: NEGATIVE
Protein, UA: NEGATIVE
Urobilinogen, UA: 0.2

## 2013-01-28 LAB — IFOBT (OCCULT BLOOD): IFOBT: NEGATIVE

## 2013-01-28 LAB — POCT UA - MICROSCOPIC ONLY
Casts, Ur, LPF, POC: NEGATIVE
Crystals, Ur, HPF, POC: NEGATIVE
Yeast, UA: NEGATIVE

## 2013-01-28 MED ORDER — METRONIDAZOLE 500 MG PO TABS: 500.0000 mg | ORAL_TABLET | Freq: Two times a day (BID) | ORAL | Status: DC

## 2013-01-28 MED ORDER — CIPROFLOXACIN HCL 500 MG PO TABS: 500.0000 mg | ORAL_TABLET | Freq: Two times a day (BID) | ORAL | Status: DC

## 2013-01-28 NOTE — Patient Instructions (Signed)
Constipation, Adult Constipation is when a person has fewer than 3 bowel movements a week; has difficulty having a bowel movement; or has stools that are dry, hard, or larger than normal. As people grow older, constipation is more common. If you try to fix constipation with medicines that make you have a bowel movement (laxatives), the problem may get worse. Long-term laxative use may cause the muscles of the colon to become weak. A low-fiber diet, not taking in enough fluids, and taking certain medicines may make constipation worse. CAUSES   Certain medicines, such as antidepressants, pain medicine, iron supplements, antacids, and water pills.   Certain diseases, such as diabetes, irritable bowel syndrome (IBS), thyroid disease, or depression.   Not drinking enough water.   Not eating enough fiber-rich foods.   Stress or travel.  Lack of physical activity or exercise.  Not going to the restroom when there is the urge to have a bowel movement.  Ignoring the urge to have a bowel movement.  Using laxatives too much. SYMPTOMS   Having fewer than 3 bowel movements a week.   Straining to have a bowel movement.   Having hard, dry, or larger than normal stools.   Feeling full or bloated.   Pain in the lower abdomen.  Not feeling relief after having a bowel movement. DIAGNOSIS  Your caregiver will take a medical history and perform a physical exam. Further testing may be done for severe constipation. Some tests may include:   A barium enema X-ray to examine your rectum, colon, and sometimes, your small intestine.  A sigmoidoscopy to examine your lower colon.  A colonoscopy to examine your entire colon. TREATMENT  Treatment will depend on the severity of your constipation and what is causing it. Some dietary treatments include drinking more fluids and eating more fiber-rich foods. Lifestyle treatments may include regular exercise. If these diet and lifestyle recommendations  do not help, your caregiver may recommend taking over-the-counter laxative medicines to help you have bowel movements. Prescription medicines may be prescribed if over-the-counter medicines do not work.  HOME CARE INSTRUCTIONS   Increase dietary fiber in your diet, such as fruits, vegetables, whole grains, and beans. Limit high-fat and processed sugars in your diet, such as [place] fries, hamburgers, cookies, candies, and soda.   A fiber supplement may be added to your diet if you cannot get enough fiber from foods.   Drink enough fluids to keep your urine clear or pale yellow.   Exercise regularly or as directed by your caregiver.   Go to the restroom when you have the urge to go. Do not hold it.  Only take medicines as directed by your caregiver. Do not take other medicines for constipation without talking to your caregiver first. [place] IF:   You have bright red blood in your stool.   Your constipation lasts for more than 4 days or gets worse.   You have abdominal or rectal pain.   You have thin, pencil-like stools.  You have unexplained weight loss. MAKE SURE YOU:   Understand these instructions.  Will watch your condition.  Will get help right away if you are not doing well or get worse. Document Released: 07/09/2004 Document Revised: 01/03/2012 Document Reviewed: 09/14/2011 [facility] Patient Information 2013 [place]. Diverticulitis A diverticulum is a small pouch or sac on the colon. Diverticulosis is the presence of these diverticula on the colon. Diverticulitis is the irritation (inflammation) or infection of diverticula. CAUSES  The colon and  its diverticula contain bacteria. If food particles block the tiny opening to a diverticulum, the bacteria inside can grow and cause an increase in pressure. This leads to infection and inflammation and is called diverticulitis. SYMPTOMS   Abdominal pain and tenderness. Usually, the pain is  located on the left side of your abdomen. However, it could be located elsewhere.  Fever.  Bloating.  Feeling sick to your stomach (nausea).  Throwing up (vomiting).  Abnormal stools. DIAGNOSIS  Your caregiver will take a history and perform a physical exam. Since many things can cause abdominal pain, other tests may be necessary. Tests may include:  Blood tests.  Urine tests.  X-ray of the abdomen.  CT scan of the abdomen. Sometimes, surgery is needed to determine if diverticulitis or other conditions are causing your symptoms. TREATMENT  Most of the time, you can be treated without surgery. Treatment includes:  Resting the bowels by only having liquids for a few days. As you improve, you will need to eat a low-fiber diet.  Intravenous (IV) fluids if you are losing body fluids (dehydrated).  Antibiotic medicines that treat infections may be given.  Pain and nausea medicine, if needed.  Surgery if the inflamed diverticulum has burst. HOME CARE INSTRUCTIONS   Try a clear liquid diet (broth, tea, or water for as long as directed by your caregiver). You may then gradually begin a low-fiber diet as tolerated. A low-fiber diet is a diet with less than 10 grams of fiber. Choose the foods below to reduce fiber in the diet:  White breads, cereals, rice, and pasta.  Cooked fruits and vegetables or soft fresh fruits and vegetables without the skin.  Ground or well-cooked tender beef, ham, veal, lamb, pork, or poultry.  Eggs and seafood.  After your diverticulitis symptoms have improved, your caregiver may put you on a high-fiber diet. A high-fiber diet includes 14 grams of fiber for every 1000 calories consumed. For a standard 2000 calorie diet, you would need 28 grams of fiber. Follow these diet guidelines to help you increase the fiber in your diet. It is important to slowly increase the amount fiber in your diet to avoid gas, constipation, and bloating.  Choose whole-grain  breads, cereals, pasta, and brown rice.  Choose fresh fruits and vegetables with the skin on. Do not overcook vegetables because the more vegetables are cooked, the more fiber is lost.  Choose more nuts, seeds, legumes, dried peas, beans, and lentils.  Look for food products that have greater than 3 grams of fiber per serving on the Nutrition Facts label.  Take all medicine as directed by your caregiver.  If your caregiver has given you a follow-up appointment, it is very important that you go. Not going could result in lasting (chronic) or permanent injury, pain, and disability. If there is any problem keeping the appointment, call to reschedule. SEEK MEDICAL CARE IF:   Your pain does not improve.  You have a hard time advancing your diet beyond clear liquids.  Your bowel movements do not return to normal. SEEK IMMEDIATE MEDICAL CARE IF:   Your pain becomes worse.  You have an oral temperature above 102 F (38.9 C), not controlled by medicine.  You have repeated vomiting.  You have bloody or black, tarry stools.  Symptoms that brought you to your caregiver become worse or are not getting better. MAKE SURE YOU:   Understand these instructions.  Will watch your condition.  Will get help right away if you are  not doing well or get worse. Document Released: 07/21/2005 Document Revised: 01/03/2012 Document Reviewed: 11/16/2010 [facility] Patient Information 2013 Nicole Melendez, [place].

## 2013-01-28 NOTE — Progress Notes (Signed)
  Subjective:    Patient ID: Nicole Melendez, female    DOB: 1976-09-12, 37 y.o.   MRN: [id number]  HPI    Review of Systems     Objective:   Physical Exam        Assessment & Plan:

## 2013-01-28 NOTE — Progress Notes (Signed)
Subjective:    Patient ID: Nicole Melendez, female    DOB: 07-24-1976, 37 y.o.   MRN: [id number]  HPI  37 year old female presents with abdominal pain and constipation.  Had a ct on April 2 went home and did what they told her to and she is drinking plenty of water.  Now having pain on the left side.  Thinks it might be the contrast that she had to drink for ct.  She drank it really fast and now feels like she is having difficulty releasing the contrast.  Having fever and chills but no vomiting.  Hurting in the left side of back.  Saw Dr. Jonny Ruiz a few days ago.  Pulse 122, oximetry 94% at intake. Repeat 97% and pulse of 86. Is constipated and using MOM and water. 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    Review of Systems Massive obesity    Objective:   Physical Exam  Vitals reviewed. Constitutional: She is oriented to person, place, and time. She appears well-nourished. She appears distressed.  Cardiovascular: Normal rate, regular rhythm and normal heart sounds.   Pulmonary/Chest: Effort normal and breath sounds normal.  Abdominal: There is tenderness. There is no rebound and no guarding.  Genitourinary: Rectal exam shows fissure, mass, tenderness and anal tone abnormal. Left adnexum displays tenderness.  Neurological: She is alert and oriented to person, place, and time. No cranial nerve deficit. She exhibits normal muscle tone. Coordination normal.   Results for orders placed in visit on 01/28/13  POCT CBC      Result Value Range   WBC 8.7  4.6 - 10.2 K/uL   Lymph, poc 1.1  0.6 - 3.4   POC LYMPH PERCENT 13.2  10 - 50 %L   MID (cbc) 0.5  0 - 0.9   POC MID % 5.8  0 - 12 %M   POC Granulocyte 7.0 (*) 2 - 6.9   Granulocyte percent 81.0 (*) 37 - 80 %G   RBC 5.15  4.04 - 5.48 M/uL   Hemoglobin 13.0  12.2 - 16.2 g/dL   HCT, POC 78.2  95.6 - 47.9 %   MCV 80.4  80 - 97 fL   MCH, POC 25.2 (*) 27 - 31.2 pg   MCHC 31.4 (*)  31.8 - 35.4 g/dL   RDW, POC 21.3     Platelet Count, POC 403  142 - 424 K/uL   MPV 7.5  0 - 99.8 fL  POCT UA - MICROSCOPIC ONLY      Result Value Range   WBC, Ur, HPF, POC 0-1     RBC, urine, microscopic 0-4     Bacteria, U Microscopic t4race     Mucus, UA trace     Epithelial cells, urine per micros 2-6     Crystals, Ur, HPF, POC neg     Casts, Ur, LPF, POC neg     Yeast, UA neg    POCT URINALYSIS DIPSTICK      Result Value Range   Color, UA amber     Clarity, UA cloudy     Glucose, UA neg     Bilirubin, UA neg     Ketones, UA neg     Spec Grav, UA 1.025     Blood, UA trace-lysed     pH, UA 6.0     Protein, UA neg     Urobilinogen, UA 0.2  Nitrite, UA neg     Leukocytes, UA Negative    IFOBT (OCCULT BLOOD)      Result Value Range   IFOBT Negative            Assessment & Plan:

## 2013-01-30 ENCOUNTER — Encounter: Payer: Self-pay | Admitting: *Deleted

## 2013-02-02 DIAGNOSIS — Z0279 Encounter for issue of other medical certificate: Secondary | ICD-10-CM

## 2013-02-08 ENCOUNTER — Other Ambulatory Visit: Payer: Self-pay | Admitting: Internal Medicine

## 2013-02-09 NOTE — Telephone Encounter (Signed)
Faxed hardcopy to [facility] [place] 

## 2013-02-09 NOTE — Telephone Encounter (Signed)
Done hardcopy to robin  

## 2013-02-13 ENCOUNTER — Ambulatory Visit: Payer: 59 | Admitting: Pulmonary Disease

## 2013-02-22 ENCOUNTER — Encounter: Payer: Self-pay | Admitting: Internal Medicine

## 2013-02-22 ENCOUNTER — Ambulatory Visit (INDEPENDENT_AMBULATORY_CARE_PROVIDER_SITE_OTHER): Payer: 59 | Admitting: Internal Medicine

## 2013-02-22 ENCOUNTER — Encounter: Payer: Self-pay | Admitting: *Deleted

## 2013-02-22 VITALS — BP 118/80 | HR 95 | Temp 99.2°F | Ht 67.0 in | Wt 340.0 lb

## 2013-02-22 DIAGNOSIS — K439 Ventral hernia without obstruction or gangrene: Secondary | ICD-10-CM

## 2013-02-22 HISTORY — PX: HERNIA MESH REMOVAL: SHX1745

## 2013-02-22 MED ORDER — ABDOMINAL BINDER/ELASTIC 3XL MISC: 1.0000 | Status: DC | PRN

## 2013-02-22 NOTE — Progress Notes (Signed)
Subjective:    Patient ID: Nicole Melendez, female    DOB: 1976/03/01, 37 y.o.   MRN: [id number]  HPI  Pt presents to the clinic today with c/o abdominal pain. This started roughly 1 month ago. The pain originally started on the right side. She saw Dr. Jonny Ruiz for the same. CT scan was performed to r/o appendicitis. She did have a small hernia containing fat but not bowel. This did not seem to be the cause of her pain. A few days after the scan was done, she saw a different doctor [facility] with c/o abdominal pain on the left side. She felt like she was having trouble passing the contrast. They did treat her with cipro/flagyl. Since that time, the pain has continued just above her umbilicus. The pain is worse when she lifts, strains or has a BM. She thinks it may be her hernia. She is supposed to have a gastric sleeve and hernia repair on May 13th. She needs help controlling the pain until that time.  Review of Systems      Past Medical History  Diagnosis Date  . Morbid obesity   . Peptic ulcer disease   . GERD (gastroesophageal reflux disease)   . Migraine   . Anxiety   . Anemia   . Hyperlipidemia   . Menorrhagia   . PNA (pneumonia) july 2011  . Diabetes mellitus type II     steroid related  . Depression   . Sarcoid     including hand per rheumatology-Dr. Dierdre Forth  . Allergic rhinitis, cause unspecified   . Varicose veins with pain   . Positive ANA (antinuclear antibody) 02/14/2012  . Shortness of breath     on exertion  . History of blood transfusion   . Sarcoidosis of lung     Current Outpatient Prescriptions  Medication Sig Dispense Refill  . acetaminophen (TYLENOL) 500 MG tablet Take 1,000 mg by mouth every 6 (six) hours as needed. For pain      . ALPRAZolam (XANAX) 0.5 MG tablet TAKE 1 TABLET BY MOUTH THREE TIMES DAILY AS NEEDED FOR ANXIETY  90 tablet  01  . B-D UF III MINI PEN NEEDLES 31G X 5 MM MISC USE FOUR TIMES DAILY AS NEEDED FOR SLIDING SCALE  200  each  0  . CELEBREX 200 MG capsule Take 200 mg by mouth daily as needed. For pain      . cetirizine (ZYRTEC) 10 MG tablet Take 10 mg by mouth daily as needed. For allergies      . ciprofloxacin (CIPRO) 500 MG tablet Take 1 tablet (500 mg total) by mouth 2 (two) times daily.  20 tablet  0  . clonazePAM (KLONOPIN) 1 MG tablet Take 1 tablet (1 mg total) by mouth 2 (two) times daily as needed for anxiety.  60 tablet  2  . cyclobenzaprine (FLEXERIL) 5 MG tablet Take 5 mg by mouth daily as needed. For spasms      . fluconazole (DIFLUCAN) 150 MG tablet Take 1 tablet (150 mg total) by mouth once.  1 tablet  1  . fluticasone (FLONASE) 50 MCG/ACT nasal spray Place 2 sprays into the nose daily as needed. For allergies      . folic acid (FOLVITE) 1 MG tablet Take 1 mg by mouth daily.      . furosemide (LASIX) 20 MG tablet Take 20 mg by mouth daily as needed. For fluid retention      . glucose blood (  ONE TOUCH ULTRA TEST) test strip Use as instructed  200 each  11  . HYDROcodone-homatropine (HYCODAN) 5-1.5 MG/5ML syrup Take 5 mLs by mouth every 8 (eight) hours as needed for cough.  120 mL  0  . insulin aspart (NOVOLOG FLEXPEN) 100 UNIT/ML injection Only as needed  3 mL  6  . methotrexate (RHEUMATREX) 2.5 MG tablet TAKE 4 TABLETS BY MOUTH ONCE A WEEK  16 tablet  0  . metroNIDAZOLE (FLAGYL) 500 MG tablet Take 1 tablet (500 mg total) by mouth 2 (two) times daily with a meal.  14 tablet  0  . nystatin (MYCOSTATIN) 100000 UNIT/ML suspension TAKE 4 ML BY MOUTH THREE TIMES DAILY . SWISH AND SWALLOW AS DIRECTED  120 mL  0  . omeprazole (PRILOSEC) 20 MG capsule Take 20 mg by mouth daily.      Letta Pate DELICA LANCETS MISC USE TO TEST FOUR TIMES DAILY  200 each  11  . predniSONE (DELTASONE) 5 MG tablet Take 5 mg by mouth daily.      . sertraline (ZOLOFT) 100 MG tablet 2 tabs by mouth per day  180 tablet  3  . sitaGLIPtan-metformin (JANUMET) 50-500 MG per tablet Take 1 tablet by mouth 2 (two) times daily with a meal.   180 tablet  3  . sulfamethoxazole-trimethoprim (SEPTRA DS) 800-160 MG per tablet Take 1 tablet by mouth 2 (two) times daily.  20 tablet  0  . SUMAtriptan (IMITREX) 100 MG tablet Take 100 mg by mouth every 2 (two) hours as needed. For migraines      . SYMBICORT 160-4.5 MCG/ACT inhaler INHALE 2 PUFFS BY MOUTH TWICE DAILY  10.2 g  3  . traMADol (ULTRAM) 50 MG tablet Take 1-2 tablets (50-100 mg total) by mouth every 6 (six) hours as needed.  30 tablet  0  . zolpidem (AMBIEN CR) 12.5 MG CR tablet TAKE 1 TABLET BY MOUTH EVERY NIGHT AT BEDTIME AS NEEDED  30 tablet  0   No current facility-administered medications for this visit.    Allergies  Allergen Reactions  . Azithromycin     (z pak) hives  . Oxycodone-Acetaminophen     REACTION: difficulty breathing  . Penicillins     REACTION: swelling and difficulty breathing    Family History  Problem Relation Age of Onset  . Diabetes    . Hypertension    . Allergies Mother   . Heart attack Mother   . Heart disease Father   . Rheum arthritis Father   . Ovarian cancer Maternal Aunt   . Lung cancer Maternal Aunt   . Breast cancer Maternal Aunt   . Bone cancer Maternal Aunt   . Other Neg Hx     History   Social History  . Marital Status: Single    Spouse Name: N/A    Number of Children: 0  . Years of Education: N/A   Occupational History  . tobacco worker    Social History Main Topics  . Smoking status: Former Smoker -- 1.00 packs/day for 20 years    Types: Cigarettes    Quit date: 04/24/2010  . Smokeless tobacco: Not on file     Comment: less than 1 ppd.  started at age 47.    Marland Kitchen Alcohol Use: No  . Drug Use: No  . Sexually Active: Yes   Other Topics Concern  . Not on file   Social History Narrative   Pt lives with Dalbert Batman- also pt of [facility]  Constitutional: Denies fever, malaise, fatigue, headache or abrupt weight changes.   Gastrointestinal: Pt reports abdominal pain. Denies bloating, constipation, diarrhea or  blood in the stool.  GU: Denies urgency, frequency, pain with urination, burning sensation, blood in urine, odor or discharge.   No other specific complaints in a complete review of systems (except as listed in HPI above).  Objective:   Physical Exam  BP 118/80  Pulse 95  Temp(Src) 99.2 F (37.3 C) (Oral)  Ht 5\' 7"  (1.702 m)  Wt 340 lb (154.223 kg)  BMI 53.24 kg/m2  SpO2 97%  LMP 07/17/2012 Wt Readings from Last 3 Encounters:  02/22/13 340 lb (154.223 kg)  01/28/13 336 lb (152.409 kg)  01/24/13 343 lb 4 oz (155.697 kg)    General: Appears her stated age, obese but well developed, well nourished in NAD. Cardiovascular: Normal rate and rhythm. S1,S2 noted.  No murmur, rubs or gallops noted. No JVD or BLE edema. No carotid bruits noted. Pulmonary/Chest: Normal effort and positive vesicular breath sounds. No respiratory distress. No wheezes, rales or ronchi noted.  Abdomen: Soft and nontender. Normal bowel sounds, no bruits noted. No distention noted. Liver, spleen and kidneys non palpable. Tender just above the umbilicus with presence of fat hernia noted.        Assessment & Plan:   Abdominal pain secondary to fat hernia, recurrent:  eRx for abdominal binder Wear when up and about for the next 2 weeks Follow up with bariatric surgery about have hernia repaired Good luck with your surgery!  RTC as needed or if symptoms persist

## 2013-02-22 NOTE — Patient Instructions (Signed)
Abdominal Pain (Nonspecific)  Your exam might not show the exact reason you have abdominal pain. Since there are many different causes of abdominal pain, another checkup and more tests may be needed. It is very important to follow up for lasting (persistent) or worsening symptoms. A possible cause of abdominal pain in any person who still has his or her appendix is acute appendicitis. Appendicitis is often hard to diagnose. Normal blood tests, urine tests, ultrasound, and CT scans do not completely rule out early appendicitis or other causes of abdominal pain. Sometimes, only the changes that happen over time will allow appendicitis and other causes of abdominal pain to be determined. Other potential problems that may require surgery may also take time to become more apparent. Because of this, it is important that you follow all of the instructions below.  HOME CARE INSTRUCTIONS    Rest as much as possible.   Do not eat solid food until your pain is gone.   While adults or children have pain: A diet of water, weak decaffeinated tea, broth or bouillon, gelatin, oral rehydration solutions (ORS), frozen ice pops, or ice chips may be helpful.   When pain is gone in adults or children: Start a light diet (dry toast, crackers, applesauce, or white rice). Increase the diet slowly as long as it does not bother you. Eat no dairy products (including cheese and eggs) and no spicy, fatty, fried, or high-fiber foods.   Use no alcohol, caffeine, or cigarettes.   Take your regular medicines unless your caregiver told you not to.   Take any prescribed medicine as directed.   Only take over-the-counter or prescription medicines for pain, discomfort, or fever as directed by your caregiver. Do not give aspirin to children.  If your caregiver has given you a follow-up appointment, it is very important to keep that appointment. Not keeping the appointment could result in a permanent injury and/or lasting (chronic) pain and/or  disability. If there is any problem keeping the appointment, you must call to reschedule.   SEEK IMMEDIATE MEDICAL CARE IF:    Your pain is not gone in 24 hours.   Your pain becomes worse, changes location, or feels different.   You or your child has an oral temperature above 102 F (38.9 C), not controlled by medicine.   Your baby is older than 3 months with a rectal temperature of 102 F (38.9 C) or higher.   Your baby is 3 months old or younger with a rectal temperature of 100.4 F (38 C) or higher.   You have shaking chills.   You keep throwing up (vomiting) or cannot drink liquids.   There is blood in your vomit or you see blood in your bowel movements.   Your bowel movements become dark or black.   You have frequent bowel movements.   Your bowel movements stop (become blocked) or you cannot pass gas.   You have bloody, frequent, or painful urination.   You have yellow discoloration in the skin or whites of the eyes.   Your stomach becomes bloated or bigger.   You have dizziness or fainting.   You have chest or back pain.  MAKE SURE YOU:    Understand these instructions.   Will watch your condition.   Will get help right away if you are not doing well or get worse.  Document Released: 10/11/2005 Document Revised: 01/03/2012 Document Reviewed: 09/08/2009  [employer] Patient Information 2013 [employer].

## 2013-02-23 ENCOUNTER — Emergency Department (HOSPITAL_COMMUNITY)
Admission: EM | Admit: 2013-02-23 | Discharge: 2013-02-23 | Disposition: A | Discharge status: Home / Self Care | Payer: 59 | Attending: Emergency Medicine | Admitting: Emergency Medicine

## 2013-02-23 ENCOUNTER — Encounter (HOSPITAL_COMMUNITY): Payer: Self-pay | Admitting: Emergency Medicine

## 2013-02-23 DIAGNOSIS — IMO0002 Reserved for concepts with insufficient information to code with codable children: Secondary | ICD-10-CM | POA: Insufficient documentation

## 2013-02-23 DIAGNOSIS — Z8709 Personal history of other diseases of the respiratory system: Secondary | ICD-10-CM | POA: Insufficient documentation

## 2013-02-23 DIAGNOSIS — F411 Generalized anxiety disorder: Secondary | ICD-10-CM | POA: Insufficient documentation

## 2013-02-23 DIAGNOSIS — Z8719 Personal history of other diseases of the digestive system: Secondary | ICD-10-CM | POA: Insufficient documentation

## 2013-02-23 DIAGNOSIS — R109 Unspecified abdominal pain: Secondary | ICD-10-CM

## 2013-02-23 DIAGNOSIS — Z862 Personal history of diseases of the blood and blood-forming organs and certain disorders involving the immune mechanism: Secondary | ICD-10-CM | POA: Insufficient documentation

## 2013-02-23 DIAGNOSIS — Z8701 Personal history of pneumonia (recurrent): Secondary | ICD-10-CM | POA: Insufficient documentation

## 2013-02-23 DIAGNOSIS — Z8619 Personal history of other infectious and parasitic diseases: Secondary | ICD-10-CM | POA: Insufficient documentation

## 2013-02-23 DIAGNOSIS — F329 Major depressive disorder, single episode, unspecified: Secondary | ICD-10-CM | POA: Insufficient documentation

## 2013-02-23 DIAGNOSIS — F3289 Other specified depressive episodes: Secondary | ICD-10-CM | POA: Insufficient documentation

## 2013-02-23 DIAGNOSIS — Z880 Allergy status to penicillin: Secondary | ICD-10-CM | POA: Insufficient documentation

## 2013-02-23 DIAGNOSIS — E785 Hyperlipidemia, unspecified: Secondary | ICD-10-CM | POA: Insufficient documentation

## 2013-02-23 DIAGNOSIS — Z87891 Personal history of nicotine dependence: Secondary | ICD-10-CM | POA: Insufficient documentation

## 2013-02-23 DIAGNOSIS — Z79899 Other long term (current) drug therapy: Secondary | ICD-10-CM | POA: Insufficient documentation

## 2013-02-23 DIAGNOSIS — Z8679 Personal history of other diseases of the circulatory system: Secondary | ICD-10-CM | POA: Insufficient documentation

## 2013-02-23 DIAGNOSIS — R110 Nausea: Secondary | ICD-10-CM | POA: Insufficient documentation

## 2013-02-23 DIAGNOSIS — E119 Type 2 diabetes mellitus without complications: Secondary | ICD-10-CM | POA: Insufficient documentation

## 2013-02-23 DIAGNOSIS — G43909 Migraine, unspecified, not intractable, without status migrainosus: Secondary | ICD-10-CM | POA: Insufficient documentation

## 2013-02-23 DIAGNOSIS — Z794 Long term (current) use of insulin: Secondary | ICD-10-CM | POA: Insufficient documentation

## 2013-02-23 DIAGNOSIS — K219 Gastro-esophageal reflux disease without esophagitis: Secondary | ICD-10-CM | POA: Insufficient documentation

## 2013-02-23 DIAGNOSIS — R1013 Epigastric pain: Secondary | ICD-10-CM | POA: Insufficient documentation

## 2013-02-23 DIAGNOSIS — Z8742 Personal history of other diseases of the female genital tract: Secondary | ICD-10-CM | POA: Insufficient documentation

## 2013-02-23 DIAGNOSIS — Z8711 Personal history of peptic ulcer disease: Secondary | ICD-10-CM | POA: Insufficient documentation

## 2013-02-23 DIAGNOSIS — Z90710 Acquired absence of both cervix and uterus: Secondary | ICD-10-CM | POA: Insufficient documentation

## 2013-02-23 LAB — URINALYSIS, ROUTINE W REFLEX MICROSCOPIC
Leukocytes, UA: NEGATIVE
Nitrite: NEGATIVE
Specific Gravity, Urine: 1.02 (ref 1.005–1.030)
Urobilinogen, UA: 0.2 mg/dL (ref 0.0–1.0)
pH: 5.5 (ref 5.0–8.0)

## 2013-02-23 LAB — CBC WITH DIFFERENTIAL/PLATELET
Eosinophils Absolute: 0.2 10*3/uL (ref 0.0–0.7)
Eosinophils Relative: 2 % (ref 0–5)
HCT: 33.6 % — ABNORMAL LOW (ref 36.0–46.0)
Hemoglobin: 11.6 g/dL — ABNORMAL LOW (ref 12.0–15.0)
Lymphs Abs: 1 10*3/uL (ref 0.7–4.0)
MCH: 26 pg (ref 26.0–34.0)
MCV: 75.3 fL — ABNORMAL LOW (ref 78.0–100.0)
Monocytes Absolute: 0.4 10*3/uL (ref 0.1–1.0)
Monocytes Relative: 4 % (ref 3–12)
RBC: 4.46 MIL/uL (ref 3.87–5.11)

## 2013-02-23 LAB — COMPREHENSIVE METABOLIC PANEL
AST: 21 U/L (ref 0–37)
Albumin: 3.7 g/dL (ref 3.5–5.2)
Alkaline Phosphatase: 109 U/L (ref 39–117)
BUN: 8 mg/dL (ref 6–23)
Chloride: 102 mEq/L (ref 96–112)
Potassium: 3.8 mEq/L (ref 3.5–5.1)
Sodium: 139 mEq/L (ref 135–145)
Total Bilirubin: 0.4 mg/dL (ref 0.3–1.2)
Total Protein: 7.8 g/dL (ref 6.0–8.3)

## 2013-02-23 MED ORDER — HYDROMORPHONE HCL PF 1 MG/ML IJ SOLN
1.0000 mg | Freq: Once | INTRAMUSCULAR | Status: DC
  Filled 2013-02-23: qty 1

## 2013-02-23 MED ORDER — PANTOPRAZOLE SODIUM 40 MG IV SOLR
40.0000 mg | Freq: Once | INTRAVENOUS | Status: AC
  Administered 2013-02-23: 40 mg via INTRAVENOUS
  Filled 2013-02-23: qty 40

## 2013-02-23 MED ORDER — PROMETHAZINE HCL 25 MG PO TABS: 25.0000 mg | ORAL_TABLET | Freq: Four times a day (QID) | ORAL | Status: DC | PRN

## 2013-02-23 MED ORDER — ONDANSETRON HCL 4 MG/2ML IJ SOLN
4.0000 mg | Freq: Once | INTRAMUSCULAR | Status: AC
  Administered 2013-02-23: 4 mg via INTRAVENOUS
  Filled 2013-02-23 (×2): qty 2

## 2013-02-23 NOTE — ED Notes (Signed)
Pt reports seeing PCP for same pain. Was told pain was related to hernia and to "find an abdominal wrap." Pt unable to find abdominal wrap and is here today for pain control. States it is a 10/10 pain. Pt reports taking 650 mg tylenol 1115 this AM, with no relief.

## 2013-02-23 NOTE — ED Notes (Signed)
IV cannula removed.

## 2013-02-23 NOTE — ED Notes (Signed)
Pt  Discharged.Vital signs stable and GCS 15

## 2013-02-23 NOTE — ED Provider Notes (Signed)
History     CSN: [id number]  Arrival date & time 02/23/13  0124   First MD Initiated Contact with Patient 02/23/13 [phone]      Chief Complaint  Patient presents with  . Abdominal Pain    (Consider location/radiation/quality/duration/timing/severity/associated sxs/prior treatment) Patient is a 37 y.o. female presenting with abdominal pain. The history is provided by the patient (pt complains of nauseau and pain).  Abdominal Pain Pain location:  Epigastric Pain quality: aching   Pain radiates to:  Does not radiate Pain severity:  Moderate Onset quality:  Sudden Timing:  Intermittent Progression:  Partially resolved Chronicity:  Recurrent Context: not alcohol use   Associated symptoms: no chest pain, no cough, no diarrhea, no fatigue and no hematuria     Past Medical History  Diagnosis Date  . Morbid obesity   . Peptic ulcer disease   . GERD (gastroesophageal reflux disease)   . Migraine   . Anxiety   . Anemia   . Hyperlipidemia   . Menorrhagia   . PNA (pneumonia) july 2011  . Diabetes mellitus type II     steroid related  . Depression   . Sarcoid     including hand per rheumatology-Dr. Dierdre Forth  . Allergic rhinitis, cause unspecified   . Varicose veins with pain   . Positive ANA (antinuclear antibody) 02/14/2012  . Shortness of breath     on exertion  . History of blood transfusion   . Sarcoidosis of lung     Past Surgical History  Procedure Laterality Date  . Uterine ablation  03/2010  . Wisdom tooth extraction    . Abdominal hysterectomy      Family History  Problem Relation Age of Onset  . Diabetes    . Hypertension    . Allergies Mother   . Heart attack Mother   . Heart disease Father   . Rheum arthritis Father   . Ovarian cancer Maternal Aunt   . Lung cancer Maternal Aunt   . Breast cancer Maternal Aunt   . Bone cancer Maternal Aunt   . Other Neg Hx     History  Substance Use Topics  . Smoking status: Former Smoker -- 1.00 packs/day for 20  years    Types: Cigarettes    Quit date: 04/24/2010  . Smokeless tobacco: Not on file     Comment: less than 1 ppd.  started at age 60.    Marland Kitchen Alcohol Use: No    OB History   Grav Para Term Preterm Abortions TAB SAB Ect Mult Living                  Review of Systems  Constitutional: Negative for appetite change and fatigue.  HENT: Negative for congestion, sinus pressure and ear discharge.   Eyes: Negative for discharge.  Respiratory: Negative for cough.   Cardiovascular: Negative for chest pain.  Gastrointestinal: Positive for abdominal pain. Negative for diarrhea.  Genitourinary: Negative for frequency and hematuria.  Musculoskeletal: Negative for back pain.  Skin: Negative for rash.  Neurological: Negative for seizures and headaches.  Psychiatric/Behavioral: Negative for hallucinations.    Allergies  Azithromycin; Oxycodone-acetaminophen; and Penicillins  Home Medications   Current Outpatient Rx  Name  Route  Sig  Dispense  Refill  . acetaminophen (TYLENOL) 500 MG tablet   Oral   Take 1,000 mg by mouth every 6 (six) hours as needed. For pain         . ALPRAZolam (XANAX) 0.5 MG tablet  TAKE 1 TABLET BY MOUTH THREE TIMES DAILY AS NEEDED FOR ANXIETY   90 tablet   01   . budesonide-formoterol (SYMBICORT) 160-4.5 MCG/ACT inhaler   Inhalation   Inhale 2 puffs into the lungs 2 (two) times daily.         . CELEBREX 200 MG capsule   Oral   Take 200 mg by mouth daily as needed. For pain         . cetirizine (ZYRTEC) 10 MG tablet   Oral   Take 10 mg by mouth daily as needed. For allergies         . cyclobenzaprine (FLEXERIL) 5 MG tablet   Oral   Take 5 mg by mouth daily as needed. For spasms         . fluticasone (FLONASE) 50 MCG/ACT nasal spray   Nasal   Place 2 sprays into the nose daily as needed. For allergies         . folic acid (FOLVITE) 1 MG tablet   Oral   Take 1 mg by mouth daily.         . furosemide (LASIX) 20 MG tablet   Oral    Take 20 mg by mouth daily as needed. For fluid retention         . HYDROcodone-homatropine (HYCODAN) 5-1.5 MG/5ML syrup   Oral   Take 5 mLs by mouth every 8 (eight) hours as needed for cough.   120 mL   0   . insulin aspart (NOVOLOG) 100 UNIT/ML injection   Subcutaneous   Inject into the skin 3 (three) times daily with meals. Sliding scale         . methotrexate (RHEUMATREX) 2.5 MG tablet   Oral   Take 10 mg by mouth once a week. fridays         . nystatin (MYCOSTATIN) 100000 UNIT/ML suspension      TAKE 4 ML BY MOUTH THREE TIMES DAILY . SWISH AND SWALLOW AS DIRECTED   120 mL   0   . omeprazole (PRILOSEC) 20 MG capsule   Oral   Take 20 mg by mouth daily.         . predniSONE (DELTASONE) 5 MG tablet   Oral   Take 5 mg by mouth daily.         . sertraline (ZOLOFT) 100 MG tablet   Oral   Take 100 mg by mouth daily.         . sitaGLIPtan-metformin (JANUMET) 50-500 MG per tablet   Oral   Take 1 tablet by mouth daily.         . SUMAtriptan (IMITREX) 100 MG tablet   Oral   Take 100 mg by mouth every 2 (two) hours as needed. For migraines         . zolpidem (AMBIEN CR) 12.5 MG CR tablet   Oral   Take 12.5 mg by mouth every morning. Pt works 3rd shift         . promethazine (PHENERGAN) 25 MG tablet   Oral   Take 1 tablet (25 mg total) by mouth every 6 (six) hours as needed for nausea.   15 tablet   0     BP 150/91  Pulse 92  Temp(Src) 98.3 F (36.8 C) (Oral)  Resp 14  SpO2 97%  LMP 07/17/2012  Physical Exam  Constitutional: She is oriented to person, place, and time. She appears well-developed.  HENT:  Head: Normocephalic.  Eyes: Conjunctivae and  EOM are normal. No scleral icterus.  Neck: Neck supple. No thyromegaly present.  Cardiovascular: Normal rate and regular rhythm.  Exam reveals no gallop and no friction rub.   No murmur heard. Pulmonary/Chest: No stridor. She has no wheezes. She has no rales. She exhibits no tenderness.   Abdominal: She exhibits no distension. There is tenderness. There is no rebound.  Mild epigastric tenderness  Musculoskeletal: Normal range of motion. She exhibits no edema.  Lymphadenopathy:    She has no cervical adenopathy.  Neurological: She is oriented to person, place, and time. Coordination normal.  Skin: No rash noted. No erythema.  Psychiatric: She has a normal mood and affect. Her behavior is normal.    ED Course  Procedures (including critical care time)  Labs Reviewed  URINALYSIS, ROUTINE W REFLEX MICROSCOPIC - Abnormal; Notable for the following:    APPearance CLOUDY (*)    Bilirubin Urine SMALL (*)    All other components within normal limits  CBC WITH DIFFERENTIAL - Abnormal; Notable for the following:    Hemoglobin 11.6 (*)    HCT 33.6 (*)    MCV 75.3 (*)    RDW 17.7 (*)    Neutrophils Relative 81 (*)    All other components within normal limits  COMPREHENSIVE METABOLIC PANEL  LIPASE, BLOOD  URINALYSIS, ROUTINE W REFLEX MICROSCOPIC   No results found.   1. Abdominal pain       MDM          Benny Lennert, MD 02/23/13 [phone]

## 2013-02-23 NOTE — ED Notes (Signed)
PT. REPORTS MID ABDOMINAL PAIN WITH NAUSEA ONSET 2 DAYS AGO , STATES HISTORY OF HERNIA .

## 2013-02-26 ENCOUNTER — Emergency Department (HOSPITAL_COMMUNITY)
Admission: EM | Admit: 2013-02-26 | Discharge: 2013-02-27 | Disposition: A | Discharge status: Home / Self Care | Payer: 59 | Attending: Emergency Medicine | Admitting: Emergency Medicine

## 2013-02-26 DIAGNOSIS — Z9189 Other specified personal risk factors, not elsewhere classified: Secondary | ICD-10-CM | POA: Insufficient documentation

## 2013-02-26 DIAGNOSIS — N920 Excessive and frequent menstruation with regular cycle: Secondary | ICD-10-CM | POA: Insufficient documentation

## 2013-02-26 DIAGNOSIS — J99 Respiratory disorders in diseases classified elsewhere: Secondary | ICD-10-CM | POA: Insufficient documentation

## 2013-02-26 DIAGNOSIS — F329 Major depressive disorder, single episode, unspecified: Secondary | ICD-10-CM | POA: Insufficient documentation

## 2013-02-26 DIAGNOSIS — Z888 Allergy status to other drugs, medicaments and biological substances status: Secondary | ICD-10-CM | POA: Insufficient documentation

## 2013-02-26 DIAGNOSIS — I83893 Varicose veins of bilateral lower extremities with other complications: Secondary | ICD-10-CM | POA: Insufficient documentation

## 2013-02-26 DIAGNOSIS — E162 Hypoglycemia, unspecified: Secondary | ICD-10-CM

## 2013-02-26 DIAGNOSIS — Z3202 Encounter for pregnancy test, result negative: Secondary | ICD-10-CM | POA: Insufficient documentation

## 2013-02-26 DIAGNOSIS — Z885 Allergy status to narcotic agent status: Secondary | ICD-10-CM | POA: Insufficient documentation

## 2013-02-26 DIAGNOSIS — Z79899 Other long term (current) drug therapy: Secondary | ICD-10-CM | POA: Insufficient documentation

## 2013-02-26 DIAGNOSIS — K219 Gastro-esophageal reflux disease without esophagitis: Secondary | ICD-10-CM | POA: Insufficient documentation

## 2013-02-26 DIAGNOSIS — Z87891 Personal history of nicotine dependence: Secondary | ICD-10-CM | POA: Insufficient documentation

## 2013-02-26 DIAGNOSIS — Z8701 Personal history of pneumonia (recurrent): Secondary | ICD-10-CM | POA: Insufficient documentation

## 2013-02-26 DIAGNOSIS — R55 Syncope and collapse: Secondary | ICD-10-CM

## 2013-02-26 DIAGNOSIS — D869 Sarcoidosis, unspecified: Secondary | ICD-10-CM | POA: Insufficient documentation

## 2013-02-26 DIAGNOSIS — Z8679 Personal history of other diseases of the circulatory system: Secondary | ICD-10-CM | POA: Insufficient documentation

## 2013-02-26 DIAGNOSIS — F411 Generalized anxiety disorder: Secondary | ICD-10-CM | POA: Insufficient documentation

## 2013-02-26 DIAGNOSIS — E785 Hyperlipidemia, unspecified: Secondary | ICD-10-CM | POA: Insufficient documentation

## 2013-02-26 DIAGNOSIS — R61 Generalized hyperhidrosis: Secondary | ICD-10-CM | POA: Insufficient documentation

## 2013-02-26 DIAGNOSIS — Z794 Long term (current) use of insulin: Secondary | ICD-10-CM | POA: Insufficient documentation

## 2013-02-26 DIAGNOSIS — E1169 Type 2 diabetes mellitus with other specified complication: Secondary | ICD-10-CM | POA: Insufficient documentation

## 2013-02-26 DIAGNOSIS — F3289 Other specified depressive episodes: Secondary | ICD-10-CM | POA: Insufficient documentation

## 2013-02-26 DIAGNOSIS — Z880 Allergy status to penicillin: Secondary | ICD-10-CM | POA: Insufficient documentation

## 2013-02-26 DIAGNOSIS — D649 Anemia, unspecified: Secondary | ICD-10-CM | POA: Insufficient documentation

## 2013-02-26 DIAGNOSIS — R894 Abnormal immunological findings in specimens from other organs, systems and tissues: Secondary | ICD-10-CM | POA: Insufficient documentation

## 2013-02-26 DIAGNOSIS — K279 Peptic ulcer, site unspecified, unspecified as acute or chronic, without hemorrhage or perforation: Secondary | ICD-10-CM | POA: Insufficient documentation

## 2013-02-26 NOTE — ED Notes (Addendum)
Per EMS: Pt walking when she had sudden onset of weakness, witnessed syncopal episode with LOC. Denies injury. PT BG 90 on arrival, which is low per pt. Pt given 12.5 g D10. CBG 150. Pt reporting nausea on arrival, given 4mg  Zofran IV.  Pt AO  X 4. Denies dizziness, blurred vision. 130/76. 80 SR. 16 RR. 100% RA.

## 2013-02-27 ENCOUNTER — Ambulatory Visit: Payer: Self-pay | Admitting: Bariatrics

## 2013-02-27 ENCOUNTER — Ambulatory Visit (INDEPENDENT_AMBULATORY_CARE_PROVIDER_SITE_OTHER): Payer: 59 | Admitting: Family Medicine

## 2013-02-27 VITALS — BP 159/75 | HR 89 | Temp 97.9°F | Resp 18 | Ht 67.25 in | Wt 338.4 lb

## 2013-02-27 DIAGNOSIS — E119 Type 2 diabetes mellitus without complications: Secondary | ICD-10-CM

## 2013-02-27 LAB — CBC WITH DIFFERENTIAL/PLATELET
Basophil #: 0.1 10*3/uL (ref 0.0–0.1)
Basophil %: 0.8 %
Eosinophil #: 0.2 10*3/uL (ref 0.0–0.7)
Eosinophils Absolute: 0.2 10*3/uL (ref 0.0–0.7)
HGB: 12.2 g/dL (ref 12.0–16.0)
Hemoglobin: 11.4 g/dL — ABNORMAL LOW (ref 12.0–15.0)
Lymphocyte %: 4.7 %
Lymphs Abs: 0.9 10*3/uL (ref 0.7–4.0)
MCH: 26 pg (ref 26.0–34.0)
MCH: 25.9 pg — ABNORMAL LOW (ref 26.0–34.0)
MCHC: 33.4 g/dL (ref 32.0–36.0)
MCV: 78 fL — ABNORMAL LOW (ref 80–100)
Monocyte #: 0.5 x10 3/mm (ref 0.2–0.9)
Monocytes Relative: 4 % (ref 3–12)
Neutro Abs: 8.4 10*3/uL — ABNORMAL HIGH (ref 1.7–7.7)
Neutrophil #: 8.2 10*3/uL — ABNORMAL HIGH (ref 1.4–6.5)
Neutrophil %: 87.2 %
Neutrophils Relative %: 85 % — ABNORMAL HIGH (ref 43–77)
RBC: 4.39 MIL/uL (ref 3.87–5.11)
RDW: 18.6 % — ABNORMAL HIGH (ref 11.5–14.5)

## 2013-02-27 LAB — BASIC METABOLIC PANEL
Anion Gap: 7 (ref 7–16)
BUN: 7 mg/dL (ref 7–18)
Calcium, Total: 9.5 mg/dL (ref 8.5–10.1)
Chloride: 110 mmol/L — ABNORMAL HIGH (ref 98–107)
Creatinine: 0.7 mg/dL (ref 0.60–1.30)
EGFR (African American): >60
Glucose: 81 mg/dL (ref 65–99)
Osmolality: 278 (ref 275–301)
Potassium: 3.6 mmol/L (ref 3.5–5.1)
Sodium: 141 mmol/L (ref 136–145)

## 2013-02-27 LAB — URINALYSIS, ROUTINE W REFLEX MICROSCOPIC
Bilirubin Urine: NEGATIVE
Glucose, UA: NEGATIVE mg/dL
Hgb urine dipstick: NEGATIVE
Ketones, ur: NEGATIVE mg/dL
Leukocytes, UA: NEGATIVE
pH: 6 (ref 5.0–8.0)

## 2013-02-27 LAB — POCT I-STAT, CHEM 8
Chloride: 105 mEq/L (ref 96–112)
Creatinine, Ser: 0.7 mg/dL (ref 0.50–1.10)
Glucose, Bld: 89 mg/dL (ref 70–99)
Potassium: 3.4 mEq/L — ABNORMAL LOW (ref 3.5–5.1)

## 2013-02-27 LAB — GLUCOSE, POCT (MANUAL RESULT ENTRY): POC Glucose: 122 mg/dl — AB (ref 70–99)

## 2013-02-27 MED ORDER — SODIUM CHLORIDE 0.9 % IV BOLUS (SEPSIS)
1000.0000 mL | Freq: Once | INTRAVENOUS | Status: AC
  Administered 2013-02-27: 1000 mL via INTRAVENOUS

## 2013-02-27 NOTE — Progress Notes (Signed)
Nicole Melendez is a 37 y.o. female who presents to [facility] today for  hypoglycemia. Patient had a syncopal event last evening and was seen in the emergency room found to be hypoglycemic. This is as result of significant weight loss in preparation for gastric bypass surgery. She's currently on a clear liquid diet. She currently feels a bit woozy but overall is well. Her blood sugars today have ranged from 50 this morning to 88 later today. She's currently taking Januvia and metformin for her diabetes. She does have NovoLog sliding scale insulin but is not required any recently.  She denies any chest pain palpitations or shortness of breath. She feels well otherwise.    PMH: Reviewed diabetes, morbid obesity, sarcoidosis History  Substance Use Topics  . Smoking status: Former Smoker -- 1.00 packs/day for 20 years    Types: Cigarettes    Quit date: 04/24/2010  . Smokeless tobacco: Not on file     Comment: less than 1 ppd.  started at age 9.    Marland Kitchen Alcohol Use: No   ROS as above  Medications reviewed. Current Outpatient Prescriptions  Medication Sig Dispense Refill  . acetaminophen (TYLENOL) 500 MG tablet Take 1,000 mg by mouth every 6 (six) hours as needed. For pain      . ALPRAZolam (XANAX) 0.5 MG tablet Take 0.5 mg by mouth 3 (three) times daily as needed for sleep or anxiety.      . budesonide-formoterol (SYMBICORT) 160-4.5 MCG/ACT inhaler Inhale 2 puffs into the lungs 2 (two) times daily.      . CELEBREX 200 MG capsule Take 200 mg by mouth daily as needed. For pain      . cetirizine (ZYRTEC) 10 MG tablet Take 10 mg by mouth daily as needed. For allergies      . cyclobenzaprine (FLEXERIL) 5 MG tablet Take 5 mg by mouth daily as needed. For spasms      . fluticasone (FLONASE) 50 MCG/ACT nasal spray Place 2 sprays into the nose daily as needed. For allergies      . folic acid (FOLVITE) 1 MG tablet Take 1 mg by mouth daily.      . furosemide (LASIX) 20 MG tablet Take 20 mg by mouth daily as  needed. For fluid retention      . insulin aspart (NOVOLOG) 100 UNIT/ML injection Inject into the skin 3 (three) times daily with meals. Sliding scale      . methotrexate (RHEUMATREX) 2.5 MG tablet Take 10 mg by mouth once a week. fridays      . omeprazole (PRILOSEC) 20 MG capsule Take 20 mg by mouth daily.      . predniSONE (DELTASONE) 5 MG tablet Take 5 mg by mouth daily.      . promethazine (PHENERGAN) 25 MG tablet Take 1 tablet (25 mg total) by mouth every 6 (six) hours as needed for nausea.  15 tablet  0  . sertraline (ZOLOFT) 100 MG tablet Take 100 mg by mouth daily.      . sitaGLIPtan-metformin (JANUMET) 50-500 MG per tablet Take 1 tablet by mouth daily.      . SUMAtriptan (IMITREX) 100 MG tablet Take 100 mg by mouth every 2 (two) hours as needed. For migraines      . traMADol (ULTRAM) 50 MG tablet Take 50 mg by mouth every 6 (six) hours as needed for pain.      Marland Kitchen zolpidem (AMBIEN CR) 12.5 MG CR tablet Take 12.5 mg by mouth every morning. Pt  works 3rd shift      . diphenoxylate-atropine (LOMOTIL) 2.5-0.025 MG per tablet Take 1 tablet by mouth 4 (four) times daily as needed for diarrhea or loose stools.      Marland Kitchen HYDROcodone-homatropine (HYCODAN) 5-1.5 MG/5ML syrup Take 5 mLs by mouth every 8 (eight) hours as needed for cough.  120 mL  0  . nystatin (MYCOSTATIN) 100000 UNIT/ML suspension TAKE 4 ML BY MOUTH THREE TIMES DAILY . SWISH AND SWALLOW AS DIRECTED  120 mL  0   No current facility-administered medications for this visit.    Exam:  BP 159/75  Pulse 89  Temp(Src) 97.9 F (36.6 C) (Oral)  Resp 18  Ht 5' 7.25" (1.708 m)  Wt 338 lb 6.4 oz (153.497 kg)  BMI 52.62 kg/m2  SpO2 96%  LMP 07/17/2012 Gen: Well NAD HEENT: EOMI,  MMM Lungs: CTABL Nl WOB Heart: RRR no MRG Abd: NABS, NT, ND Exts: Non edematous BL  LE, warm and well perfused.   Results for orders placed during the hospital encounter of 02/26/13 (from the past 72 hour(s))  URINALYSIS, ROUTINE W REFLEX MICROSCOPIC      Status: Abnormal   Collection Time    02/27/13  1:25 AM      Result Value Range   Color, Urine YELLOW  YELLOW   APPearance CLOUDY (*) CLEAR   Specific Gravity, Urine 1.020  1.005 - 1.030   pH 6.0  5.0 - 8.0   Glucose, UA NEGATIVE  NEGATIVE mg/dL   Hgb urine dipstick NEGATIVE  NEGATIVE   Bilirubin Urine NEGATIVE  NEGATIVE   Ketones, ur NEGATIVE  NEGATIVE mg/dL   Protein, ur NEGATIVE  NEGATIVE mg/dL   Urobilinogen, UA 1.0  0.0 - 1.0 mg/dL   Nitrite NEGATIVE  NEGATIVE   Leukocytes, UA NEGATIVE  NEGATIVE   Comment: MICROSCOPIC NOT DONE ON URINES WITH NEGATIVE PROTEIN, BLOOD, LEUKOCYTES, NITRITE, OR GLUCOSE <1000 mg/dL.  GLUCOSE, CAPILLARY     Status: None   Collection Time    02/27/13  1:30 AM      Result Value Range   Glucose-Capillary 91  70 - 99 mg/dL  POCT PREGNANCY, URINE     Status: None   Collection Time    02/27/13  1:31 AM      Result Value Range   Preg Test, Ur NEGATIVE  NEGATIVE   Comment:            THE SENSITIVITY OF THIS     METHODOLOGY IS >24 mIU/mL  CBC WITH DIFFERENTIAL     Status: Abnormal   Collection Time    02/27/13  2:02 AM      Result Value Range   WBC 9.8  4.0 - 10.5 K/uL   RBC 4.39  3.87 - 5.11 MIL/uL   Hemoglobin 11.4 (*) 12.0 - 15.0 g/dL   HCT 40.9 (*) 81.1 - 91.4 %   MCV 74.0 (*) 78.0 - 100.0 fL   MCH 26.0  26.0 - 34.0 pg   MCHC 35.1  30.0 - 36.0 g/dL   RDW 78.2 (*) 95.6 - 21.3 %   Platelets 273  150 - 400 K/uL   Neutrophils Relative 85 (*) 43 - 77 %   Neutro Abs 8.4 (*) 1.7 - 7.7 K/uL   Lymphocytes Relative 9 (*) 12 - 46 %   Lymphs Abs 0.9  0.7 - 4.0 K/uL   Monocytes Relative 4  3 - 12 %   Monocytes Absolute 0.3  0.1 - 1.0 K/uL  Eosinophils Relative 2  0 - 5 %   Eosinophils Absolute 0.2  0.0 - 0.7 K/uL   Basophils Relative 0  0 - 1 %   Basophils Absolute 0.0  0.0 - 0.1 K/uL  POCT I-STAT, CHEM 8     Status: Abnormal   Collection Time    02/27/13  2:27 AM      Result Value Range   Sodium 140  135 - 145 mEq/L   Potassium 3.4 (*) 3.5 -  5.1 mEq/L   Chloride 105  96 - 112 mEq/L   BUN 6  6 - 23 mg/dL   Creatinine, Ser 2.72  0.50 - 1.10 mg/dL   Glucose, Bld 89  70 - 99 mg/dL   Calcium, Ion 5.36  1.12 - 1.23 mmol/L   TCO2 27  0 - 100 mmol/L   Hemoglobin 12.2  12.0 - 15.0 g/dL   HCT 64.4  03.4 - 74.2 %  GLUCOSE, CAPILLARY     Status: Abnormal   Collection Time    02/27/13  3:27 AM      Result Value Range   Glucose-Capillary 117 (*) 70 - 99 mg/dL    Assessment and Plan: 37 y.o. female with hypoglycemia. This is likely related to severe calorie restriction with a clear liquid diet in preparation for gastric bypass surgery.   She's currently doing well. Plan to discontinue Januvia and metformin.  We'll carefully monitor blood sugars over the next week. Followup as needed.

## 2013-02-27 NOTE — ED Provider Notes (Signed)
History     CSN: [id number]  Arrival date & time 02/26/13  2349   First MD Initiated Contact with Patient 02/27/13 0129      Chief Complaint  Patient presents with  . Near Syncope    (Consider location/radiation/quality/duration/timing/severity/associated sxs/prior treatment) HPI 37 yo female presents to the ER via EMS with reported syncope.  Pt is unsure of what happened.  Friends report she became very diaphoretic, was "talking crazy" and then fell forward.  EMS reports blood sugar was 90 upon their arrival.  Pt reports this is very low for her-she normally runs 120-150.  She was given D10.  Pt is on week three of a clear liquid diet in preparation for bariatric surgery next week.  She is eating chicken broth, diluted juice, and bariatric replacement meal supplement as well as vitamins. This diet was recommended by her bariatric surgeon.  No prior episodes of hypoglycemia on this diet.  No h/o syncope.  Pt currently denies chest pain, palpitations, sob, head or face pain, n/v, fever.  She does not use novolog, only janumet which has been recently adjusted downward by her doctors.   Past Medical History  Diagnosis Date  . Morbid obesity   . Peptic ulcer disease   . GERD (gastroesophageal reflux disease)   . Migraine   . Anxiety   . Anemia   . Hyperlipidemia   . Menorrhagia   . PNA (pneumonia) july 2011  . Diabetes mellitus type II     steroid related  . Depression   . Sarcoid     including hand per rheumatology-Dr. Dierdre Forth  . Allergic rhinitis, cause unspecified   . Varicose veins with pain   . Positive ANA (antinuclear antibody) 02/14/2012  . Shortness of breath     on exertion  . History of blood transfusion   . Sarcoidosis of lung     Past Surgical History  Procedure Laterality Date  . Uterine ablation  03/2010  . Wisdom tooth extraction    . Abdominal hysterectomy      Family History  Problem Relation Age of Onset  . Diabetes    . Hypertension    . Allergies  Mother   . Heart attack Mother   . Heart disease Father   . Rheum arthritis Father   . Ovarian cancer Maternal Aunt   . Lung cancer Maternal Aunt   . Breast cancer Maternal Aunt   . Bone cancer Maternal Aunt   . Other Neg Hx     History  Substance Use Topics  . Smoking status: Former Smoker -- 1.00 packs/day for 20 years    Types: Cigarettes    Quit date: 04/24/2010  . Smokeless tobacco: Not on file     Comment: less than 1 ppd.  started at age 62.    Marland Kitchen Alcohol Use: No    OB History   Grav Para Term Preterm Abortions TAB SAB Ect Mult Living                  Review of Systems  All other systems reviewed and are negative.    Allergies  Azithromycin; Oxycodone-acetaminophen; and Penicillins  Home Medications   Current Outpatient Rx  Name  Route  Sig  Dispense  Refill  . acetaminophen (TYLENOL) 500 MG tablet   Oral   Take 1,000 mg by mouth every 6 (six) hours as needed. For pain         . ALPRAZolam (XANAX) 0.5 MG tablet  Oral   Take 0.5 mg by mouth 3 (three) times daily as needed for sleep or anxiety.         . budesonide-formoterol (SYMBICORT) 160-4.5 MCG/ACT inhaler   Inhalation   Inhale 2 puffs into the lungs 2 (two) times daily.         . CELEBREX 200 MG capsule   Oral   Take 200 mg by mouth daily as needed. For pain         . cetirizine (ZYRTEC) 10 MG tablet   Oral   Take 10 mg by mouth daily as needed. For allergies         . cyclobenzaprine (FLEXERIL) 5 MG tablet   Oral   Take 5 mg by mouth daily as needed. For spasms         . diphenoxylate-atropine (LOMOTIL) 2.5-0.025 MG per tablet   Oral   Take 1 tablet by mouth 4 (four) times daily as needed for diarrhea or loose stools.         . fluticasone (FLONASE) 50 MCG/ACT nasal spray   Nasal   Place 2 sprays into the nose daily as needed. For allergies         . folic acid (FOLVITE) 1 MG tablet   Oral   Take 1 mg by mouth daily.         . furosemide (LASIX) 20 MG tablet    Oral   Take 20 mg by mouth daily as needed. For fluid retention         . HYDROcodone-homatropine (HYCODAN) 5-1.5 MG/5ML syrup   Oral   Take 5 mLs by mouth every 8 (eight) hours as needed for cough.   120 mL   0   . insulin aspart (NOVOLOG) 100 UNIT/ML injection   Subcutaneous   Inject into the skin 3 (three) times daily with meals. Sliding scale         . methotrexate (RHEUMATREX) 2.5 MG tablet   Oral   Take 10 mg by mouth once a week. fridays         . nystatin (MYCOSTATIN) 100000 UNIT/ML suspension      TAKE 4 ML BY MOUTH THREE TIMES DAILY . SWISH AND SWALLOW AS DIRECTED   120 mL   0   . omeprazole (PRILOSEC) 20 MG capsule   Oral   Take 20 mg by mouth daily.         . predniSONE (DELTASONE) 5 MG tablet   Oral   Take 5 mg by mouth daily.         . promethazine (PHENERGAN) 25 MG tablet   Oral   Take 1 tablet (25 mg total) by mouth every 6 (six) hours as needed for nausea.   15 tablet   0   . sertraline (ZOLOFT) 100 MG tablet   Oral   Take 100 mg by mouth daily.         . sitaGLIPtan-metformin (JANUMET) 50-500 MG per tablet   Oral   Take 1 tablet by mouth daily.         . SUMAtriptan (IMITREX) 100 MG tablet   Oral   Take 100 mg by mouth every 2 (two) hours as needed. For migraines         . traMADol (ULTRAM) 50 MG tablet   Oral   Take 50 mg by mouth every 6 (six) hours as needed for pain.         Marland Kitchen zolpidem (AMBIEN CR) 12.5 MG CR  tablet   Oral   Take 12.5 mg by mouth every morning. Pt works 3rd shift           BP 127/69  Pulse 78  Temp(Src) 98.1 F (36.7 C) (Oral)  Resp 16  SpO2 100%  LMP 07/17/2012  Physical Exam  Nursing note and vitals reviewed. Constitutional: She is oriented to person, place, and time. She appears well-developed and well-nourished.  Morbid obesity  HENT:  Head: Normocephalic and atraumatic.  Right Ear: External ear normal.  Left Ear: External ear normal.  Nose: Nose normal.  Mouth/Throat: Oropharynx  is clear and moist.  Eyes: Conjunctivae and EOM are normal. Pupils are equal, round, and reactive to light.  Neck: Normal range of motion. Neck supple. No JVD present. No tracheal deviation present. No thyromegaly present.  Cardiovascular: Normal rate, regular rhythm, normal heart sounds and intact distal pulses.  Exam reveals no gallop and no friction rub.   No murmur heard. Pulmonary/Chest: Effort normal and breath sounds normal. No stridor. No respiratory distress. She has no wheezes. She has no rales. She exhibits no tenderness.  Abdominal: Soft. Bowel sounds are normal. She exhibits no distension and no mass. There is no tenderness. There is no rebound and no guarding.  Musculoskeletal: Normal range of motion. She exhibits no edema and no tenderness.  Lymphadenopathy:    She has no cervical adenopathy.  Neurological: She is oriented to person, place, and time. She has normal reflexes. No cranial nerve deficit. She exhibits normal muscle tone. Coordination normal.  Skin: Skin is warm and dry. No rash noted. No erythema. No pallor.  Psychiatric: She has a normal mood and affect. Her behavior is normal. Judgment and thought content normal.    ED Course  Procedures (including critical care time)  Labs Reviewed  URINALYSIS, ROUTINE W REFLEX MICROSCOPIC - Abnormal; Notable for the following:    APPearance CLOUDY (*)    All other components within normal limits  CBC WITH DIFFERENTIAL - Abnormal; Notable for the following:    Hemoglobin 11.4 (*)    HCT 32.5 (*)    MCV 74.0 (*)    RDW 17.3 (*)    Neutrophils Relative 85 (*)    Neutro Abs 8.4 (*)    Lymphocytes Relative 9 (*)    All other components within normal limits  POCT I-STAT, CHEM 8 - Abnormal; Notable for the following:    Potassium 3.4 (*)    All other components within normal limits  POCT PREGNANCY, URINE   No results found.   Date: 02/27/2013  Rate: 76  Rhythm: normal sinus rhythm  QRS Axis: right  Intervals: normal   ST/T Wave abnormalities: normal  Conduction Disutrbances:none  Narrative Interpretation:   Old EKG Reviewed: none available    1. Hypoglycemia   2. Syncope       MDM  37 yo female with syncope.  Noted to be slightly orthostatic here, appears slighly dehydrated on physical exam.  Do not feel sxs due to PE or malignant arrythmia.  Pt has drank juice and had IVF here, able to ambulate and feels well.  Will d/c to f/u with her bariatric team at [facility].        Olivia Mackie, MD 02/27/13 [id number]

## 2013-02-27 NOTE — Patient Instructions (Addendum)
Thank you for coming in today. Discontinue the Januvia and metformin Check your blood sugar 3 times a day and when you feel woozy Continue the clear liquid diet Followup with your surgeon as scheduled.  Return as needed

## 2013-02-27 NOTE — ED Notes (Signed)
Pt ambulated in hallway without difficulty

## 2013-02-28 ENCOUNTER — Other Ambulatory Visit: Payer: Self-pay | Admitting: Internal Medicine

## 2013-02-28 ENCOUNTER — Ambulatory Visit: Payer: 59 | Admitting: Internal Medicine

## 2013-03-02 ENCOUNTER — Encounter: Payer: Self-pay | Admitting: Pulmonary Disease

## 2013-03-02 ENCOUNTER — Ambulatory Visit (INDEPENDENT_AMBULATORY_CARE_PROVIDER_SITE_OTHER): Payer: 59 | Admitting: Pulmonary Disease

## 2013-03-02 VITALS — BP 126/82 | HR 98 | Temp 97.9°F | Ht 67.0 in | Wt 335.0 lb

## 2013-03-02 DIAGNOSIS — D869 Sarcoidosis, unspecified: Secondary | ICD-10-CM

## 2013-03-02 MED ORDER — CETIRIZINE HCL 10 MG PO TABS: 10.0000 mg | ORAL_TABLET | Freq: Every day | ORAL | Status: DC | PRN

## 2013-03-02 MED ORDER — PREDNISONE 5 MG PO TABS: 5.0000 mg | ORAL_TABLET | Freq: Every day | ORAL | Status: DC

## 2013-03-02 NOTE — Patient Instructions (Addendum)
No change in medications You will need followup breathing tests after your surgery.  Will wait until pain has resolved so we can get good effort. followup with me in 6mos.

## 2013-03-02 NOTE — Addendum Note (Signed)
Addended by: Nita Sells on: 03/02/2013 11:14 AM   Modules accepted: Orders

## 2013-03-02 NOTE — Assessment & Plan Note (Signed)
Patient has done very well from a pulmonary standpoint on methotrexate and low-dose prednisone for her sarcoidosis.  Her x-rays and symptoms have remained stable.  She is due for pulmonary function studies, but will await until she is recovered from her upcoming bariatric surgery.  I have asked her to continue on her current medications, to keep up with her labs while on methotrexate, and to work aggressively on weight loss.  Will schedule for pulmonary function studies when she is recovered from her surgery.

## 2013-03-02 NOTE — Progress Notes (Signed)
  Subjective:    Patient ID: Nicole Melendez, female    DOB: [birth year], 37 y.o.   MRN: [id number]  HPI The patient comes in today for followup of her sarcoidosis.  She has maintained on methotrexate and low-dose prednisone, and has done extremely well from a pulmonary standpoint.  She believes her breathing is at a stable baseline, and has not had any significant cough.  She is scheduled for bariatric surgery next week.  She is continuing to followup with rheumatology regarding her joint complaints, and there is a discussion about changing her methotrexate to injectable form.  She is continuing to have her labs checked periodically while being on methotrexate.   Review of Systems  Constitutional: Negative for fever and unexpected weight change.  HENT: Negative for ear pain, nosebleeds, congestion, sore throat, rhinorrhea, sneezing, trouble swallowing, dental problem, postnasal drip and sinus pressure.   Eyes: Negative for redness and itching.  Respiratory: Negative for cough, chest tightness, shortness of breath and wheezing.   Cardiovascular: Negative for palpitations and leg swelling.  Gastrointestinal: Negative for nausea and vomiting.  Genitourinary: Negative for dysuria.  Musculoskeletal: Negative for joint swelling.  Skin: Negative for rash.  Neurological: Positive for headaches (d/t stopping DM meds for surgery---"blood sugar HA").  Hematological: Does not bruise/bleed easily.  Psychiatric/Behavioral: Negative for dysphoric mood. The patient is not nervous/anxious.        Objective:   Physical Exam Morbidly obese female in no acute distress Nose without purulence or discharge noted Neck without lymphadenopathy or thyromegaly Chest totally clear to auscultation, no wheezing Cardiac exam with regular rate and rhythm Lower extremities with minimal edema, no cyanosis Alert and oriented, moves all 4 extremities.       Assessment & Plan:

## 2013-03-06 ENCOUNTER — Inpatient Hospital Stay: Payer: Self-pay | Admitting: Bariatrics

## 2013-03-07 LAB — BASIC METABOLIC PANEL
Anion Gap: 6 — ABNORMAL LOW (ref 7–16)
BUN: 6 mg/dL — ABNORMAL LOW (ref 7–18)
EGFR (African American): >60
EGFR (Non-African Amer.): >60
Osmolality: 271 (ref 275–301)
Potassium: 4.1 mmol/L (ref 3.5–5.1)

## 2013-03-07 LAB — CBC WITH DIFFERENTIAL/PLATELET
Basophil #: 0.1 10*3/uL (ref 0.0–0.1)
Basophil %: 0.6 %
HCT: 33.3 % — ABNORMAL LOW (ref 35.0–47.0)
HGB: 11.4 g/dL — ABNORMAL LOW (ref 12.0–16.0)
Lymphocyte %: 7.8 %
MCV: 78 fL — ABNORMAL LOW (ref 80–100)
Monocyte %: 6.5 %
Neutrophil #: 8 10*3/uL — ABNORMAL HIGH (ref 1.4–6.5)
RBC: 4.28 10*6/uL (ref 3.80–5.20)

## 2013-04-05 ENCOUNTER — Other Ambulatory Visit: Payer: Self-pay | Admitting: Pulmonary Disease

## 2013-04-13 ENCOUNTER — Ambulatory Visit: Payer: 59 | Admitting: Internal Medicine

## 2013-04-23 ENCOUNTER — Other Ambulatory Visit: Payer: Self-pay | Admitting: Internal Medicine

## 2013-04-23 NOTE — Telephone Encounter (Signed)
Done hardcopy to robin  

## 2013-04-23 NOTE — Telephone Encounter (Signed)
Faxed hardcopy to [facility] 

## 2013-08-30 ENCOUNTER — Other Ambulatory Visit: Payer: Self-pay

## 2013-09-07 ENCOUNTER — Ambulatory Visit: Payer: 59 | Admitting: Pulmonary Disease

## 2013-12-28 ENCOUNTER — Other Ambulatory Visit: Payer: Self-pay | Admitting: Pulmonary Disease

## 2013-12-28 ENCOUNTER — Other Ambulatory Visit: Payer: Self-pay | Admitting: Internal Medicine

## 2013-12-28 NOTE — Telephone Encounter (Signed)
Faxed hardcopy to [facility] [place] 

## 2013-12-28 NOTE — Telephone Encounter (Signed)
Done hardcopy to robin  

## 2013-12-31 ENCOUNTER — Encounter: Payer: Self-pay | Admitting: Internal Medicine

## 2013-12-31 ENCOUNTER — Telehealth: Payer: Self-pay | Admitting: *Deleted

## 2013-12-31 ENCOUNTER — Ambulatory Visit (INDEPENDENT_AMBULATORY_CARE_PROVIDER_SITE_OTHER): Payer: BC Managed Care – PPO | Admitting: Internal Medicine

## 2013-12-31 VITALS — BP 108/60 | HR 105 | Temp 98.0°F | Wt 263.6 lb

## 2013-12-31 DIAGNOSIS — M130 Polyarthritis, unspecified: Secondary | ICD-10-CM

## 2013-12-31 DIAGNOSIS — D869 Sarcoidosis, unspecified: Secondary | ICD-10-CM

## 2013-12-31 DIAGNOSIS — J209 Acute bronchitis, unspecified: Secondary | ICD-10-CM

## 2013-12-31 MED ORDER — HYDROXYZINE HCL 10 MG PO TABS: 10.0000 mg | ORAL_TABLET | Freq: Four times a day (QID) | ORAL | Status: DC | PRN

## 2013-12-31 MED ORDER — MOMETASONE FUROATE 0.1 % EX OINT: TOPICAL_OINTMENT | Freq: Two times a day (BID) | CUTANEOUS | Status: DC

## 2013-12-31 MED ORDER — MELOXICAM 7.5 MG PO TABS: ORAL_TABLET | ORAL | Status: DC

## 2013-12-31 MED ORDER — DOXYCYCLINE HYCLATE 100 MG PO TABS: 100.0000 mg | ORAL_TABLET | Freq: Two times a day (BID) | ORAL | Status: DC

## 2013-12-31 NOTE — Progress Notes (Addendum)
   Subjective:    Patient ID: Nicole Melendez, female    DOB: May 09, 1976, 38 y.o.   MRN: [id number]  HPI  Swelling of her hands and feet began 12/27/13; this is in the context of sarcoid with present joint involvement. She is seeing a rheumatologist. She restarted her methotrexate and prednisone on 3/7; she has noted some improvement in the swelling. She is here because she's continued to have swelling as well as discomfort.Tylenol & Tramadol don't help; she is unable to work.  There was no specific trigger or cause of the swelling such as increased salt intake or prolonged travel / standing. She is not on amlodipine       Review of Systems    She has had cough and productive sputum for 3 weeks. It is now yellow-green. She is having intermittent fever mainly  late in the evenings.       Objective:   Physical Exam   General appearance:adequately nourished; no acute distress or increased work of breathing is present.  No  lymphadenopathy about the head, neck, or axilla noted.   Eyes: No conjunctival inflammation or lid edema is present. There is no scleral icterus.  Ears:  External ear exam shows no significant lesions or deformities.  Otoscopic examination reveals clear canals, tympanic membranes are intact bilaterally without bulging, retraction, inflammation or discharge.  Nose:  External nasal examination shows no deformity or inflammation. Nasal mucosa are pink and moist without lesions or exudates. No septal dislocation or deviation.No obstruction to airflow.   Oral exam: Dental hygiene is good; lips and gums are healthy appearing.There is no oropharyngeal erythema or exudate noted.   Neck:  No deformities,  masses, or tenderness noted.     Heart:  Normal rate and regular rhythm. S1 and S2 normal without gallop, murmur, click, rub or other extra sounds.   Lungs:Chest clear to auscultation; no wheezes, rhonchi,rales ,or rubs present.No increased work of breathing.     Extremities:  No cyanosis, edema, or clubbing  noted . Hands appear surprisingly normal   Skin: Warm & dry w/o jaundice or tenting.         Assessment & Plan:  #1 sarcoidosis with joint involvement; swelling and pain secondary to this area and she's just restarted her methotrexate and prednisone which should address her present complaints  #2 bronchitis. Immunosuppression would not be suspect as she has been off the methotrexate and prednisone 8 mos See orders

## 2013-12-31 NOTE — Telephone Encounter (Signed)
[facility] Triage Call Report Triage Record Num: [id number] Operator: Feliberto Harts Patient Name: Janet Humphreys Call Date & Time: 12/29/2013 3:06:54PM Patient Phone: [phone] PCP: Cathlean Cower Patient Gender: Female PCP Fax : [phone] Patient DOB: [birth year] Practice Name: [facility] Reason for Call: Caller: Raeghan/Patient; PCP: Cathlean Cower (Adults only); CB#: [phone]; Call regarding Medication Issue; Medication(s): Methotrexate. Takes Methotrexate 2.5.mg Takes 4 pills once a week on Friday. States called office to refill pills on Thursday, but did not get call back. States [facility] sent over Medication Refill Request but was not sent back. Missed Methotrexate on 12/28/2013 because was out. Methrotrexate was prescribed by Dr. Lorel [facility] and last visit was 02/2013. Dr. Penni Homans notified. Ordered Methotrexate 2.5 mg tab. Take 4 tabs once a week on Fri.. Called to Hugh/Pharmacist/[facility]. Pt. has appt. 3/13 with Dr. Judi Cong and 3/27 with Dr. Gwenette Greet. Protocol(s) Used: Office Note Recommended Outcome per Protocol: Information Noted and Sent to Office Reason for Outcome: Caller information to office Care Advice: ~

## 2013-12-31 NOTE — Progress Notes (Signed)
Pre visit review using our clinic review tool, if applicable. No additional management support is needed unless otherwise documented below in the visit note. 

## 2013-12-31 NOTE — Progress Notes (Addendum)
   Subjective:    Patient ID: Nicole Melendez, female    DOB: 10/16/1976, 38 y.o.   MRN: [id number]  HPI      Review of Systems        Objective:   Physical Exam        Assessment & Plan:    A user error has taken place: charting done on wrong patient and has been corrected.

## 2013-12-31 NOTE — Addendum Note (Signed)
Addended byUnice Cobble F on: 12/31/2013 04:54 PM   Modules accepted: Orders

## 2013-12-31 NOTE — Patient Instructions (Addendum)
Please remain out of work until 01/02/2014.

## 2014-01-04 ENCOUNTER — Encounter: Payer: Self-pay | Admitting: Internal Medicine

## 2014-01-04 ENCOUNTER — Ambulatory Visit (INDEPENDENT_AMBULATORY_CARE_PROVIDER_SITE_OTHER): Payer: BC Managed Care – PPO | Admitting: Internal Medicine

## 2014-01-04 VITALS — BP 112/82 | HR 102 | Temp 98.1°F | Ht 67.0 in | Wt 261.8 lb

## 2014-01-04 DIAGNOSIS — N760 Acute vaginitis: Secondary | ICD-10-CM

## 2014-01-04 DIAGNOSIS — D509 Iron deficiency anemia, unspecified: Secondary | ICD-10-CM

## 2014-01-04 DIAGNOSIS — F411 Generalized anxiety disorder: Secondary | ICD-10-CM

## 2014-01-04 DIAGNOSIS — Z0000 Encounter for general adult medical examination without abnormal findings: Secondary | ICD-10-CM

## 2014-01-04 DIAGNOSIS — D869 Sarcoidosis, unspecified: Secondary | ICD-10-CM

## 2014-01-04 DIAGNOSIS — E119 Type 2 diabetes mellitus without complications: Secondary | ICD-10-CM

## 2014-01-04 DIAGNOSIS — Z23 Encounter for immunization: Secondary | ICD-10-CM

## 2014-01-04 MED ORDER — ALPRAZOLAM 0.5 MG PO TABS: ORAL_TABLET | ORAL | Status: DC

## 2014-01-04 MED ORDER — FLUCONAZOLE 150 MG PO TABS: ORAL_TABLET | ORAL | Status: DC

## 2014-01-04 NOTE — Assessment & Plan Note (Signed)
Also for iron f/u 

## 2014-01-04 NOTE — Assessment & Plan Note (Signed)
For diflucan asd,  to f/u any worsening symptoms or concerns 

## 2014-01-04 NOTE — Assessment & Plan Note (Signed)

## 2014-01-04 NOTE — Progress Notes (Signed)
Subjective:    Patient ID: Nicole Melendez, female    DOB: [birth year], 38 y.o.   MRN: [id number]  HPI  Here for wellness and f/u;  Overall doing ok;  Pt denies CP, worsening SOB, DOE, wheezing, orthopnea, PND, worsening LE edema, palpitations, dizziness or syncope.  Pt denies neurological change such as new headache, facial or extremity weakness.  Pt denies polydipsia, polyuria, or low sugar symptoms. Pt states overall good compliance with treatment and medications, good tolerability, and has been trying to follow lower cholesterol diet.  Pt denies worsening depressive symptoms, suicidal ideation or panic. No fever, night sweats, wt loss, loss of appetite, or other constitutional symptoms.  Pt states good ability with ADL's, has low fall risk, home safety reviewed and adequate, no other significant changes in hearing or vision, and only occasionally active with exercise. Working now with [employer], requests labs.  Had recent bronchitis now improved except for slight nonprod cough.  Asks for diflucan due to yeast infx related to doxy use. Past Medical History  Diagnosis Date  . Morbid obesity   . Peptic ulcer disease   . GERD (gastroesophageal reflux disease)   . Migraine   . Anxiety   . Anemia   . Hyperlipidemia   . Menorrhagia   . PNA (pneumonia) july 2011  . Diabetes mellitus type II     steroid related  . Depression   . Sarcoid     including hand per rheumatology-Dr. Amil Amen  . Allergic rhinitis, cause unspecified   . Varicose veins with pain   . Positive ANA (antinuclear antibody) 02/14/2012  . Shortness of breath     on exertion  . History of blood transfusion   . Sarcoidosis of lung    Past Surgical History  Procedure Laterality Date  . Uterine ablation  03/2010  . Wisdom tooth extraction    . Abdominal hysterectomy      reports that she quit smoking about 14 months ago. Her smoking use included Cigarettes. She has a 20 pack-year smoking history. She does not have any  smokeless tobacco history on file. She reports that she does not drink alcohol or use illicit drugs. family history includes Allergies in her mother; Bone cancer in her maternal aunt; Breast cancer in her maternal aunt; Diabetes in an other family member; Heart attack in her mother; Heart disease in her father; Hypertension in an other family member; Lung cancer in her maternal aunt; Ovarian cancer in her maternal aunt; Rheum arthritis in her father. There is no history of Other. Allergies  Allergen Reactions  . Azithromycin     (z pak) hives  . Oxycodone-Acetaminophen     REACTION: difficulty breathing  . Penicillins     REACTION: swelling and difficulty breathing   Current Outpatient Prescriptions on File Prior to Visit  Medication Sig Dispense Refill  . acetaminophen (TYLENOL) 500 MG tablet Take 1,000 mg by mouth every 6 (six) hours as needed. For pain      . budesonide-formoterol (SYMBICORT) 160-4.5 MCG/ACT inhaler Inhale 2 puffs into the lungs 2 (two) times daily.      Marland Kitchen doxycycline (VIBRA-TABS) 100 MG tablet Take 1 tablet (100 mg total) by mouth 2 (two) times daily.  14 tablet  0  . fluticasone (FLONASE) 50 MCG/ACT nasal spray Place 2 sprays into the nose daily as needed. For allergies      . insulin aspart (NOVOLOG) 100 UNIT/ML injection Inject into the skin 3 (three) times daily with meals. Sliding  scale      . meloxicam (MOBIC) 7.5 MG tablet 1 bid  20 tablet  0  . methotrexate (RHEUMATREX) 2.5 MG tablet Take 10 mg by mouth once a week. fridays      . methotrexate (RHEUMATREX) 2.5 MG tablet TAKE 4 TABLETS BY MOUTH EVERY FRIDAY NIGHT. PROTECT FROM LIGHT.  16 tablet  0  . promethazine (PHENERGAN) 25 MG tablet Take 1 tablet (25 mg total) by mouth every 6 (six) hours as needed for nausea.  15 tablet  0  . XOPENEX HFA 45 MCG/ACT inhaler INHALE 1-2 PUFFS BY MOUTH EVERY 4 HOURS AS NEEDED FOR WHEEZING  15 g  11   No current facility-administered medications on file prior to visit.   Overall  states lost > 100 lbs, only takes insulin prn.  Review of Systems Constitutional: Negative for diaphoresis, activity change, appetite change or unexpected weight change.  HENT: Negative for hearing loss, ear pain, facial swelling, mouth sores and neck stiffness.   Eyes: Negative for pain, redness and visual disturbance.  Respiratory: Negative for shortness of breath and wheezing.   Cardiovascular: Negative for chest pain and palpitations.  Gastrointestinal: Negative for diarrhea, blood in stool, abdominal distention or other pain Genitourinary: Negative for hematuria, flank pain or change in urine volume.  Musculoskeletal: Negative for myalgias and joint swelling.  Skin: Negative for color change and wound.  Neurological: Negative for syncope and numbness. other than noted Hematological: Negative for adenopathy.  Psychiatric/Behavioral: Negative for hallucinations, self-injury, decreased concentration and agitation.      Objective:   Physical Exam BP 112/82  Pulse 102  Temp(Src) 98.1 F (36.7 C) (Oral)  Ht 5\' 7"  (1.702 m)  Wt 261 lb 12 oz (118.729 kg)  BMI 40.99 kg/m2  SpO2 96%  LMP 07/17/2012 VS noted,  Constitutional: Pt is oriented to person, place, and time./obese Appears well-developed and well-nourished.  Head: Normocephalic and atraumatic.  Right Ear: External ear normal.  Left Ear: External ear normal.  Nose: Nose normal.  Mouth/Throat: Oropharynx is clear and moist.  Eyes: Conjunctivae and EOM are normal. Pupils are equal, round, and reactive to light.  Neck: Normal range of motion. Neck supple. No JVD present. No tracheal deviation present.  Cardiovascular: Normal rate, regular rhythm, normal heart sounds and intact distal pulses.   Pulmonary/Chest: Effort normal and breath sounds normal.  Abdominal: Soft. Bowel sounds are normal. There is no tenderness. No HSM  Musculoskeletal: Normal range of motion. Exhibits no edema.  Lymphadenopathy:  Has no cervical  adenopathy.  Neurological: Pt is alert and oriented to person, place, and time. Pt has normal reflexes. No cranial nerve deficit.  Skin: Skin is warm and dry. No rash noted.  Psychiatric:  Has  normal mood and affect. Behavior is normal.     Assessment & Plan:

## 2014-01-04 NOTE — Progress Notes (Signed)
Pre visit review using our clinic review tool, if applicable. No additional management support is needed unless otherwise documented below in the visit note. 

## 2014-01-04 NOTE — Assessment & Plan Note (Signed)
stable overall by history and exam, recent data reviewed with pt, and pt to continue medical treatment as before,  to f/u any worsening symptoms or concerns Lab Results  Component Value Date   HGBA1C 6.1 01/08/2013

## 2014-01-04 NOTE — Assessment & Plan Note (Signed)
To cont prednisone as rx, f/u pulm, recent bronchitis symtpomatically improved

## 2014-01-04 NOTE — Assessment & Plan Note (Signed)
stable overall by history and exam, recent data reviewed with pt, and pt to continue medical treatment as before,  to f/u any worsening symptoms or concerns Lab Results  Component Value Date   WBC 9.8 02/27/2013   HGB 12.2 02/27/2013   HCT 36.0 02/27/2013   PLT 273 02/27/2013   GLUCOSE 89 02/27/2013   CHOL 165 01/08/2013   TRIG 107.0 01/08/2013   HDL 45.70 01/08/2013   LDLDIRECT 135.0 07/05/2011   LDLCALC 98 01/08/2013   ALT 18 02/23/2013   AST 21 02/23/2013   NA 140 02/27/2013   K 3.4* 02/27/2013   CL 105 02/27/2013   CREATININE 0.70 02/27/2013   BUN 6 02/27/2013   CO2 24 02/23/2013   TSH 1.01 01/08/2013   INR 1.34 05/13/2010   HGBA1C 6.1 01/08/2013   MICROALBUR 0.7 01/08/2013

## 2014-01-04 NOTE — Patient Instructions (Addendum)
You had the flu shot today  Please continue all other medications as before, and refills have been done if requested. Please have the pharmacy call with any other refills you may need.  Please continue your efforts at being more active, low cholesterol diet, and weight control. You are otherwise up to date with prevention measures today.  You will be contacted regarding the referral for: mammogram  Please go to the LAB in the Basement (turn left off the elevator) for the tests to be done today You will be contacted by phone if any changes need to be made immediately.  Otherwise, you will receive a letter about your results with an explanation, but please check with MyChart first.  Please remember to sign up for MyChart if you have not done so, as this will be important to you in the future with finding out test results, communicating by private email, and scheduling acute appointments online when needed.  Please keep your appointments with your specialists as you have planned  - Dr Amil Amen  Please return in 6 months, or sooner if needed, with Lab testing done 3-5 days before

## 2014-01-09 ENCOUNTER — Other Ambulatory Visit: Payer: Self-pay | Admitting: Internal Medicine

## 2014-01-09 DIAGNOSIS — Z1231 Encounter for screening mammogram for malignant neoplasm of breast: Secondary | ICD-10-CM

## 2014-01-11 ENCOUNTER — Ambulatory Visit: Payer: BC Managed Care – PPO

## 2014-01-18 ENCOUNTER — Encounter: Payer: Self-pay | Admitting: Pulmonary Disease

## 2014-01-18 ENCOUNTER — Other Ambulatory Visit (INDEPENDENT_AMBULATORY_CARE_PROVIDER_SITE_OTHER): Payer: BC Managed Care – PPO

## 2014-01-18 ENCOUNTER — Ambulatory Visit (INDEPENDENT_AMBULATORY_CARE_PROVIDER_SITE_OTHER)
Admission: RE | Admit: 2014-01-18 | Discharge: 2014-01-18 | Disposition: A | Discharge status: Home / Self Care | Payer: BC Managed Care – PPO | Source: Ambulatory Visit | Attending: Pulmonary Disease | Admitting: Pulmonary Disease

## 2014-01-18 ENCOUNTER — Ambulatory Visit (INDEPENDENT_AMBULATORY_CARE_PROVIDER_SITE_OTHER): Payer: BC Managed Care – PPO | Admitting: Pulmonary Disease

## 2014-01-18 VITALS — BP 129/86 | HR 72 | Temp 98.0°F | Ht 67.0 in | Wt 246.0 lb

## 2014-01-18 DIAGNOSIS — D869 Sarcoidosis, unspecified: Secondary | ICD-10-CM

## 2014-01-18 DIAGNOSIS — Z79899 Other long term (current) drug therapy: Secondary | ICD-10-CM

## 2014-01-18 LAB — CBC WITH DIFFERENTIAL/PLATELET
Basophils Absolute: 0 10*3/uL (ref 0.0–0.1)
Basophils Relative: 0.2 % (ref 0.0–3.0)
EOS ABS: 0.1 10*3/uL (ref 0.0–0.7)
Eosinophils Relative: 2.2 % (ref 0.0–5.0)
HCT: 38.2 % (ref 36.0–46.0)
Hemoglobin: 12.9 g/dL (ref 12.0–15.0)
LYMPHS PCT: 11.5 % — AB (ref 12.0–46.0)
Lymphs Abs: 0.8 10*3/uL (ref 0.7–4.0)
MCHC: 33.8 g/dL (ref 30.0–36.0)
MCV: 87.6 fl (ref 78.0–100.0)
Monocytes Absolute: 0.3 10*3/uL (ref 0.1–1.0)
Monocytes Relative: 4.4 % (ref 3.0–12.0)
NEUTROS PCT: 81.7 % — AB (ref 43.0–77.0)
Neutro Abs: 5.6 10*3/uL (ref 1.4–7.7)
PLATELETS: 309 10*3/uL (ref 150.0–400.0)
RBC: 4.37 Mil/uL (ref 3.87–5.11)
RDW: 15.2 % — AB (ref 11.5–14.6)
WBC: 6.8 10*3/uL (ref 4.5–10.5)

## 2014-01-18 NOTE — Progress Notes (Signed)
   Subjective:    Patient ID: Nicole Melendez, female    DOB: 1976-08-26, 38 y.o.   MRN: [id number]  HPI The patient comes in today for followup of her known pulmonary sarcoidosis. She has done very well with methotrexate and low-dose prednisone in the past, which also treated her joint issues. She was off methotrexate for a period of time, but has had a recurrence of her hand discomfort. She is now back on the medication under the direction of her rheumatologist. The patient has had bariatric surgery, and has lost almost 100 pounds since the last visit. She never did have her followup PFTs, and is due for her chest x-ray as well. She feels that her breathing has greatly improved, probably more because of the weight loss than anything else.   Review of Systems  Constitutional: Negative for fever and unexpected weight change.  HENT: Positive for facial swelling, postnasal drip and sinus pressure. Negative for congestion, dental problem, ear pain, nosebleeds, rhinorrhea, sneezing, sore throat and trouble swallowing.   Eyes: Negative for redness and itching.  Respiratory: Negative for cough, chest tightness, shortness of breath and wheezing.   Cardiovascular: Negative for palpitations and leg swelling.  Gastrointestinal: Negative for nausea and vomiting.  Genitourinary: Negative for dysuria.  Musculoskeletal: Negative for joint swelling.  Skin: Negative for rash.  Neurological: Negative for headaches.  Hematological: Does not bruise/bleed easily.  Psychiatric/Behavioral: Negative for dysphoric mood. The patient is not nervous/anxious.        Objective:   Physical Exam Overweight female in no acute distress Nose without purulence or discharge noted Neck without lymphadenopathy or thyromegaly Chest is totally clear to auscultation Cardiac exam with regular rate and rhythm Lower extremities with no significant edema, no cyanosis Alert and oriented, moves all 4 extremities        Assessment & Plan:

## 2014-01-18 NOTE — Patient Instructions (Signed)
Will get bloodwork requested by Dr. Amil Amen, and send to him Will give you prescription for MTX and folic acid until your visit with rheumatology. Will check chest xray today, and call you with results. Please schedule breathing tests in the next 2-4 weeks to evaluate your lungs, and will call you with results.

## 2014-01-18 NOTE — Assessment & Plan Note (Signed)
The patient has done very well from a pulmonary standpoint, with no significant cough and greatly improved dyspnea on exertion. I suspect a lot of her shortness of breath was due to to her morbid obesity, and she has lost 100 pounds after her bariatric surgery.  She is back home methotrexate and low-dose prednisone because of her joint symptoms, and she is due for a chest x-ray and pulmonary function studies. Her lungs are clear on exam today. I will call her with the results once they're available.

## 2014-01-23 ENCOUNTER — Telehealth: Payer: Self-pay | Admitting: Pulmonary Disease

## 2014-01-23 NOTE — Telephone Encounter (Signed)
Spoke with lab technician  Will do research to find out why this is saying "needs to be collected"

## 2014-01-23 NOTE — Telephone Encounter (Signed)
Spoke with Blanch Media in the lab and advised her the order for CBC w/diff and CMET were place 01/18/14 @11 :21 a.m BOTH orders. Informed to advise Caren Griffins her supervisor and contact pt ASAP to come and have this lab drawn free of charge and explain to pt why they did not draw this the first time. Also, informed her my manager Maryann Conners would follow up that this matter has been dealt with ASAP

## 2014-01-23 NOTE — Telephone Encounter (Signed)
Nicole Melendez, we never got the CMET results on this pt but did get cbc. The computer says not collected!  What gives??

## 2014-01-24 ENCOUNTER — Encounter: Payer: Self-pay | Admitting: Internal Medicine

## 2014-01-26 ENCOUNTER — Other Ambulatory Visit: Payer: Self-pay | Admitting: Internal Medicine

## 2014-01-28 ENCOUNTER — Other Ambulatory Visit (INDEPENDENT_AMBULATORY_CARE_PROVIDER_SITE_OTHER): Payer: BC Managed Care – PPO

## 2014-01-28 ENCOUNTER — Ambulatory Visit (INDEPENDENT_AMBULATORY_CARE_PROVIDER_SITE_OTHER): Payer: BC Managed Care – PPO | Admitting: Pulmonary Disease

## 2014-01-28 ENCOUNTER — Telehealth: Payer: Self-pay | Admitting: Pulmonary Disease

## 2014-01-28 DIAGNOSIS — D869 Sarcoidosis, unspecified: Secondary | ICD-10-CM

## 2014-01-28 DIAGNOSIS — Z5181 Encounter for therapeutic drug level monitoring: Secondary | ICD-10-CM

## 2014-01-28 LAB — PULMONARY FUNCTION TEST
DL/VA % PRED: 81 %
DL/VA: 4.11 ml/min/mmHg/L
DLCO UNC: 17.48 ml/min/mmHg
DLCO unc % pred: 64 %
FEF 25-75 Post: 2 L/sec
FEF 25-75 Pre: 2.72 L/sec
FEF2575-%Change-Post: -26 %
FEF2575-%PRED-PRE: 91 %
FEF2575-%Pred-Post: 67 %
FEV1-%CHANGE-POST: -6 %
FEV1-%PRED-PRE: 86 %
FEV1-%Pred-Post: 80 %
FEV1-Post: 2.19 L
FEV1-Pre: 2.35 L
FEV1FVC-%Change-Post: -7 %
FEV1FVC-%Pred-Pre: 103 %
FEV6-%Change-Post: -1 %
FEV6-%Pred-Post: 82 %
FEV6-%Pred-Pre: 84 %
FEV6-POST: 2.68 L
FEV6-Pre: 2.73 L
FEV6FVC-%PRED-PRE: 102 %
FEV6FVC-%Pred-Post: 102 %
FVC-%Change-Post: 0 %
FVC-%PRED-PRE: 82 %
FVC-%Pred-Post: 82 %
FVC-POST: 2.74 L
FVC-PRE: 2.73 L
POST FEV1/FVC RATIO: 80 %
POST FEV6/FVC RATIO: 100 %
Pre FEV1/FVC ratio: 86 %
Pre FEV6/FVC Ratio: 100 %
RV % pred: 71 %
RV: 1.2 L
TLC % PRED: 72 %
TLC: 3.9 L

## 2014-01-28 LAB — COMPREHENSIVE METABOLIC PANEL
ALT: 12 U/L (ref 0–35)
AST: 12 U/L (ref 0–37)
Albumin: 3.5 g/dL (ref 3.5–5.2)
Alkaline Phosphatase: 89 U/L (ref 39–117)
BILIRUBIN TOTAL: 0.8 mg/dL (ref 0.3–1.2)
BUN: 11 mg/dL (ref 6–23)
CHLORIDE: 107 meq/L (ref 96–112)
CO2: 26 meq/L (ref 19–32)
CREATININE: 0.8 mg/dL (ref 0.4–1.2)
Calcium: 9 mg/dL (ref 8.4–10.5)
GFR: 105.16 mL/min (ref >60.00)
Glucose, Bld: 92 mg/dL (ref 70–99)
Potassium: 3.9 mEq/L (ref 3.5–5.1)
Sodium: 139 mEq/L (ref 135–145)
Total Protein: 6.7 g/dL (ref 6.0–8.3)

## 2014-01-28 NOTE — Telephone Encounter (Signed)
Lynette from the [place] office called and said that pt's CMET needs to be made "future" This has been done Will sign off

## 2014-01-28 NOTE — Progress Notes (Signed)
PFt done. Nicole Melendez, CMA  

## 2014-01-29 ENCOUNTER — Other Ambulatory Visit: Payer: Self-pay | Admitting: Internal Medicine

## 2014-01-29 ENCOUNTER — Encounter: Payer: Self-pay | Admitting: Internal Medicine

## 2014-01-29 ENCOUNTER — Ambulatory Visit (INDEPENDENT_AMBULATORY_CARE_PROVIDER_SITE_OTHER): Payer: BC Managed Care – PPO | Admitting: Internal Medicine

## 2014-01-29 VITALS — BP 120/62 | HR 87 | Temp 97.9°F | Resp 14 | Wt 264.0 lb

## 2014-01-29 DIAGNOSIS — F4323 Adjustment disorder with mixed anxiety and depressed mood: Secondary | ICD-10-CM

## 2014-01-29 MED ORDER — SERTRALINE HCL 50 MG PO TABS: 50.0000 mg | ORAL_TABLET | Freq: Every day | ORAL | Status: DC

## 2014-01-29 NOTE — Telephone Encounter (Signed)
Patient informed of refills remaining and to check with drug store.

## 2014-01-29 NOTE — Telephone Encounter (Signed)
Xanax already done mar 15 for 3 mo total tx

## 2014-01-29 NOTE — Patient Instructions (Addendum)
Please remain out of work until seen by Dr Jenny Reichmann 01/31/14 Sertraline 50 mg 1/2 daily X 2 days.
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
## 2014-01-29 NOTE — Progress Notes (Signed)
Pre visit review using our clinic review tool, if applicable. No additional management support is needed unless otherwise documented below in the visit note. 

## 2014-01-31 ENCOUNTER — Ambulatory Visit (INDEPENDENT_AMBULATORY_CARE_PROVIDER_SITE_OTHER): Payer: BC Managed Care – PPO | Admitting: Internal Medicine

## 2014-01-31 ENCOUNTER — Other Ambulatory Visit: Payer: Self-pay

## 2014-01-31 ENCOUNTER — Encounter: Payer: Self-pay | Admitting: Internal Medicine

## 2014-01-31 VITALS — BP 118/82 | HR 91 | Temp 97.8°F | Ht 67.0 in | Wt 264.0 lb

## 2014-01-31 DIAGNOSIS — E119 Type 2 diabetes mellitus without complications: Secondary | ICD-10-CM

## 2014-01-31 DIAGNOSIS — F411 Generalized anxiety disorder: Secondary | ICD-10-CM

## 2014-01-31 DIAGNOSIS — F3289 Other specified depressive episodes: Secondary | ICD-10-CM

## 2014-01-31 DIAGNOSIS — F329 Major depressive disorder, single episode, unspecified: Secondary | ICD-10-CM

## 2014-01-31 DIAGNOSIS — F4323 Adjustment disorder with mixed anxiety and depressed mood: Secondary | ICD-10-CM

## 2014-01-31 MED ORDER — SERTRALINE HCL 100 MG PO TABS: ORAL_TABLET | ORAL | Status: DC

## 2014-01-31 NOTE — Progress Notes (Signed)
Pre visit review using our clinic review tool, if applicable. No additional management support is needed unless otherwise documented below in the visit note. 

## 2014-01-31 NOTE — Patient Instructions (Addendum)
OK to increase the zoloft to 50 mg per day for 3 days, then 100 mg per day for 3 days, then 150 mg per day for 3 days, then 200 mg per day after that (a new prescription has been sent to your pharmacy)  Please continue all other medications as before, and refills have been done if requested. Please have the pharmacy call with any other refills you may need.  You should have refills of the xanax already at the phamacy, please call if this is not correct  Please continue your efforts at being more active, low cholesterol diet, and weight control.  Please keep your appointments with your specialists as you have planned - psychology Feb 13, 2014  You are given the LOA work note for the dates of Apr 6 with a return to work date of Feb 18, 2014  Please return in 6 months, or sooner if needed

## 2014-01-31 NOTE — Progress Notes (Signed)
Subjective:    Patient ID: Nicole Melendez, female    DOB: 12-12-1975, 38 y.o.   MRN: [id number]  HPI Here to f/u, c/o overwhelmed with anxiety and panic recently, has been increased depressed,  Mod to severe, no suicidal ideation or plan. 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 Zoloft helped in the past.  Pt denies chest pain, increased sob or doe, wheezing, orthopnea, PND, increased LE swelling, palpitations, dizziness or syncope.  Pt denies new neurological symptoms such as new headache, or facial or extremity weakness or numbness   Pt denies polydipsia, polyuria,   Past Medical History  Diagnosis Date  . Morbid obesity   . Peptic ulcer disease   . GERD (gastroesophageal reflux disease)   . Migraine   . Anxiety   . Anemia   . Hyperlipidemia   . Menorrhagia   . PNA (pneumonia) july 2011  . Diabetes mellitus type II     steroid related  . Depression   . Sarcoid     including hand per rheumatology-Dr. Amil Amen  . Allergic rhinitis, cause unspecified   . Varicose veins with pain   . Positive ANA (antinuclear antibody) 02/14/2012  . Shortness of breath     on exertion  . History of blood transfusion   . Sarcoidosis of lung    Past Surgical History  Procedure Laterality Date  . Uterine ablation  03/2010  . Wisdom tooth extraction    . Abdominal hysterectomy      reports that she quit smoking about 15 months ago. Her smoking use included Cigarettes. She has a 20 pack-year smoking history. She does not have any smokeless tobacco history on file. She reports that she does not drink alcohol or use illicit drugs. family history includes Allergies in her mother; Bone cancer in her maternal aunt; Breast cancer in her maternal aunt; Diabetes in an other family member; Heart attack in her mother; Heart disease in her father; Hypertension in an other family member;  Lung cancer in her maternal aunt; Ovarian cancer in her maternal aunt; Rheum arthritis in her father. There is no history of Other. Allergies  Allergen Reactions  . Azithromycin     (z pak) hives  . Oxycodone-Acetaminophen     REACTION: difficulty breathing  . Penicillins     REACTION: swelling and difficulty breathing   . Current Outpatient Prescriptions on File Prior to Visit  Medication Sig Dispense Refill  . acetaminophen (TYLENOL) 500 MG tablet Take 1,000 mg by mouth every 6 (six) hours as needed. For pain      . ALPRAZolam (XANAX) 0.5 MG tablet TAKE 1 TABLET BY MOUTH THREE TIMES DAILY AS NEEDED FOR ANXIETY  90 tablet  2  . budesonide-formoterol (SYMBICORT) 160-4.5 MCG/ACT inhaler Inhale 2 puffs into the lungs 2 (two) times daily.      . fluticasone (FLONASE) 50 MCG/ACT nasal spray Place 2 sprays into the nose daily as needed. For allergies      . insulin aspart (NOVOLOG) 100 UNIT/ML injection Inject into the skin 3 (three) times daily with meals. Sliding scale      . meloxicam (MOBIC) 7.5 MG tablet 1 bid  20 tablet  0  . methotrexate (RHEUMATREX) 2.5 MG tablet TAKE 4 TABLETS BY MOUTH EVERY FRIDAY NIGHT. PROTECT FROM LIGHT.  16 tablet  0  . XOPENEX HFA 45 MCG/ACT inhaler INHALE 1-2  PUFFS BY MOUTH EVERY 4 HOURS AS NEEDED FOR WHEEZING  15 g  11   No current facility-administered medications on file prior to visit.   Review of Systems  Constitutional: Negative for unexpected weight change, or unusual diaphoresis  HENT: Negative for tinnitus.   Eyes: Negative for photophobia and visual disturbance.  Respiratory: Negative for choking and stridor.   Gastrointestinal: Negative for vomiting and blood in stool.  Genitourinary: Negative for hematuria and decreased urine volume.  Musculoskeletal: Negative for acute joint swelling Skin: Negative for color change and wound.  Neurological: Negative for tremors and numbness other than noted  Psychiatric/Behavioral: Negative for decreased  concentration or  hyperactivity.       Objective:   Physical Exam BP 118/82  Pulse 91  Temp(Src) 97.8 F (36.6 C) (Oral)  Ht 5\' 7"  (1.702 m)  Wt 264 lb (119.75 kg)  BMI 41.34 kg/m2  SpO2 99%  LMP 07/17/2012 VS noted,  Constitutional: Pt appears well-developed and well-nourished.  HENT: Head: NCAT.  Right Ear: External ear normal.  Left Ear: External ear normal.  Eyes: Conjunctivae and EOM are normal. Pupils are equal, round, and reactive to light.  Neck: Normal range of motion. Neck supple.  Cardiovascular: Normal rate and regular rhythm.   Pulmonary/Chest: Effort normal and breath sounds normal.  Neurological: Pt is alert. Not confused  Skin: Skin is warm. No erythema.  Psychiatric: Pt behavior is normal. Thought content normal. 2+ nervous, with markedly depressed affect    Assessment & Plan:

## 2014-02-03 NOTE — Assessment & Plan Note (Addendum)
To re-start zoloft asd,  to f/u any worsening symptoms or concerns, declines psychiatry referral, to go to ER or [facility] for worsening or sucidal ideation, for LOA letter

## 2014-02-03 NOTE — Assessment & Plan Note (Signed)
Cont xanax prn, already had rx

## 2014-02-03 NOTE — Assessment & Plan Note (Signed)
stable overall by history and exam, recent data reviewed with pt, and pt to continue medical treatment as before,  to f/u any worsening symptoms or concerns Lab Results  Component Value Date   HGBA1C 6.1 01/08/2013    

## 2014-02-07 ENCOUNTER — Encounter: Payer: Self-pay | Admitting: Pulmonary Disease

## 2014-02-13 ENCOUNTER — Ambulatory Visit (INDEPENDENT_AMBULATORY_CARE_PROVIDER_SITE_OTHER): Payer: BC Managed Care – PPO | Admitting: Psychiatry

## 2014-02-13 DIAGNOSIS — F410 Panic disorder [episodic paroxysmal anxiety] without agoraphobia: Secondary | ICD-10-CM

## 2014-02-13 DIAGNOSIS — F329 Major depressive disorder, single episode, unspecified: Secondary | ICD-10-CM

## 2014-02-13 DIAGNOSIS — F3289 Other specified depressive episodes: Secondary | ICD-10-CM

## 2014-02-14 ENCOUNTER — Encounter: Payer: Self-pay | Admitting: *Deleted

## 2014-02-15 ENCOUNTER — Encounter: Payer: Self-pay | Admitting: Internal Medicine

## 2014-02-15 ENCOUNTER — Ambulatory Visit (INDEPENDENT_AMBULATORY_CARE_PROVIDER_SITE_OTHER): Payer: BC Managed Care – PPO | Admitting: Internal Medicine

## 2014-02-15 VITALS — BP 126/88 | HR 79 | Temp 98.3°F | Ht 67.0 in | Wt 261.2 lb

## 2014-02-15 DIAGNOSIS — F411 Generalized anxiety disorder: Secondary | ICD-10-CM

## 2014-02-15 DIAGNOSIS — J309 Allergic rhinitis, unspecified: Secondary | ICD-10-CM

## 2014-02-15 DIAGNOSIS — E119 Type 2 diabetes mellitus without complications: Secondary | ICD-10-CM

## 2014-02-15 MED ORDER — ALPRAZOLAM 0.5 MG PO TABS: ORAL_TABLET | ORAL | Status: DC

## 2014-02-15 MED ORDER — CETIRIZINE HCL 10 MG PO TABS: 10.0000 mg | ORAL_TABLET | Freq: Every day | ORAL | Status: DC

## 2014-02-15 NOTE — Assessment & Plan Note (Signed)
To cont current meds, f/u counseling as planned, gave further work note

## 2014-02-15 NOTE — Assessment & Plan Note (Signed)
Mild to mod, for zyrtec asd,  to f/u any worsening symptoms or concerns 

## 2014-02-15 NOTE — Patient Instructions (Signed)
Please take all new medication as prescribed - the zyrtec  Please continue all other medications as before, and refills have been done if requested - the xanax  Please have the pharmacy call with any other refills you may need.  You are given the work note

## 2014-02-15 NOTE — Progress Notes (Signed)
Subjective:    Patient ID: Nicole Melendez, female    DOB: [birth year], 38 y.o.   MRN: [id number]  HPI  Here to f/u, improved but still States panic attack only down from 15/day 6 per day , Now up to 200 mg zoloft for the past wk, tolerating well. Xanax seems to wear off quickly.  Saw apr 22 psychologist, for f/u at 1 mo.  Still with significant fatigue. , states unable to cope with work. Still pushing herself to excericise, lost over 100lbs, no wt regain so far. Denies worsening depressive symptoms, suicidal ideation.   Does have several wks ongoing nasal allergy symptoms with clearish congestion, itch and sneezing, without fever, pain, ST, cough, swelling or wheezing.  Pt denies polydipsia, polyuria,   Past Medical History  Diagnosis Date  . Morbid obesity   . Peptic ulcer disease   . GERD (gastroesophageal reflux disease)   . Migraine   . Anxiety   . Anemia   . Hyperlipidemia   . Menorrhagia   . PNA (pneumonia) july 2011  . Diabetes mellitus type II     steroid related  . Depression   . Sarcoid     including hand per rheumatology-Dr. Amil Amen  . Allergic rhinitis, cause unspecified   . Varicose veins with pain   . Positive ANA (antinuclear antibody) 02/14/2012  . Shortness of breath     on exertion  . History of blood transfusion   . Sarcoidosis of lung    Past Surgical History  Procedure Laterality Date  . Uterine ablation  03/2010  . Wisdom tooth extraction    . Abdominal hysterectomy      reports that she quit smoking about 15 months ago. Her smoking use included Cigarettes. She has a 20 pack-year smoking history. She does not have any smokeless tobacco history on file. She reports that she does not drink alcohol or use illicit drugs. family history includes Allergies in her mother; Bone cancer in her maternal aunt; Breast cancer in her maternal aunt; Diabetes in an other family member; Heart attack in her mother; Heart disease in her father; Hypertension in an other family  member; Lung cancer in her maternal aunt; Ovarian cancer in her maternal aunt; Rheum arthritis in her father. There is no history of Other. Allergies  Allergen Reactions  . Azithromycin     (z pak) hives  . Oxycodone-Acetaminophen     REACTION: difficulty breathing  . Penicillins     REACTION: swelling and difficulty breathing  . Klonopin [Clonazepam] Other (See Comments)    Memory difficulty   Current Outpatient Prescriptions on File Prior to Visit  Medication Sig Dispense Refill  . acetaminophen (TYLENOL) 500 MG tablet Take 1,000 mg by mouth every 6 (six) hours as needed. For pain      . budesonide-formoterol (SYMBICORT) 160-4.5 MCG/ACT inhaler Inhale 2 puffs into the lungs 2 (two) times daily.      . fluticasone (FLONASE) 50 MCG/ACT nasal spray Place 2 sprays into the nose daily as needed. For allergies      . insulin aspart (NOVOLOG) 100 UNIT/ML injection Inject into the skin 3 (three) times daily with meals. Sliding scale      . meloxicam (MOBIC) 7.5 MG tablet 1 bid  20 tablet  0  . methotrexate (RHEUMATREX) 2.5 MG tablet TAKE 4 TABLETS BY MOUTH EVERY FRIDAY NIGHT. PROTECT FROM LIGHT.  16 tablet  0  . sertraline (ZOLOFT) 100 MG tablet 2 tabs by mouth per day  180 tablet  3  . XOPENEX HFA 45 MCG/ACT inhaler INHALE 1-2 PUFFS BY MOUTH EVERY 4 HOURS AS NEEDED FOR WHEEZING  15 g  11   No current facility-administered medications on file prior to visit.   Review of Systems  Constitutional: Negative for unusual diaphoresis or other sweats  HENT: Negative for ringing in ear Eyes: Negative for double vision or worsening visual disturbance.  Respiratory: Negative for choking and stridor.   Gastrointestinal: Negative for vomiting or other signifcant bowel change Genitourinary: Negative for hematuria or decreased urine volume.  Musculoskeletal: Negative for other MSK pain or swelling Skin: Negative for color change and worsening wound.  Neurological: Negative for tremors and numbness other  than noted        Objective:   Physical Exam BP 126/88  Pulse 79  Temp(Src) 98.3 F (36.8 C) (Oral)  Ht 5\' 7"  (1.702 m)  Wt 261 lb 3.2 oz (118.48 kg)  BMI 40.90 kg/m2  SpO2 97%  LMP 07/17/2012 VS noted,  Constitutional: Pt appears well-developed, well-nourished.  HENT: Head: NCAT.  Right Ear: External ear normal.  Left Ear: External ear normal.  Bilat tm's with mild erythema.  Max sinus areas non tender.  Pharynx with mild erythema, no exudate Eyes: . Pupils are equal, round, and reactive to light. Conjunctivae and EOM are normal Neck: Normal range of motion. Neck supple.  Cardiovascular: Normal rate and regular rhythm.   Pulmonary/Chest: Effort normal and breath sounds normal.  Neurological: Pt is alert. Not confused , motor grossly intact Skin: Skin is warm. No rash Psychiatric: Pt behavior is normal. No agitation. 1-2+ nervous, some depressed affect    Assessment & Plan:

## 2014-02-15 NOTE — Progress Notes (Signed)
Pre visit review using our clinic review tool, if applicable. No additional management support is needed unless otherwise documented below in the visit note. 

## 2014-02-15 NOTE — Assessment & Plan Note (Signed)
stable overall by history and exam, recent data reviewed with pt, and pt to continue medical treatment as before,  to f/u any worsening symptoms or concerns Lab Results  Component Value Date   HGBA1C 6.1 01/08/2013

## 2014-02-22 ENCOUNTER — Other Ambulatory Visit: Payer: Self-pay | Admitting: Internal Medicine

## 2014-02-25 ENCOUNTER — Other Ambulatory Visit: Payer: Self-pay | Admitting: Internal Medicine

## 2014-02-25 NOTE — Telephone Encounter (Signed)
Done erx 

## 2014-02-27 ENCOUNTER — Other Ambulatory Visit: Payer: Self-pay | Admitting: Internal Medicine

## 2014-03-04 ENCOUNTER — Other Ambulatory Visit: Payer: Self-pay | Admitting: Internal Medicine

## 2014-03-05 ENCOUNTER — Other Ambulatory Visit: Payer: Self-pay | Admitting: Internal Medicine

## 2014-03-05 MED ORDER — ALPRAZOLAM 0.5 MG PO TABS: ORAL_TABLET | ORAL | Status: DC

## 2014-03-05 NOTE — Telephone Encounter (Signed)
This is third reqeust for xanax, and appears to be too soon   Ok to let pharmacy know

## 2014-03-05 NOTE — Telephone Encounter (Signed)
Done hardcopy to robin  

## 2014-03-05 NOTE — Telephone Encounter (Signed)
Maybe the last paper refill is up front in the closet?

## 2014-03-05 NOTE — Telephone Encounter (Signed)
Normally would fax alprazolam, but checked the cabnet and no prescription for this patient was found.

## 2014-03-05 NOTE — Telephone Encounter (Signed)
Called the pharmacy to inform and they stated they did not send in request.  The patient stated she last refilled in march.

## 2014-03-05 NOTE — Telephone Encounter (Signed)
g

## 2014-03-06 NOTE — Telephone Encounter (Signed)
Faxed script back to [facility].../lmb 

## 2014-03-13 ENCOUNTER — Ambulatory Visit: Payer: BC Managed Care – PPO | Admitting: Psychiatry

## 2014-03-13 DIAGNOSIS — Z0279 Encounter for issue of other medical certificate: Secondary | ICD-10-CM

## 2014-03-19 ENCOUNTER — Ambulatory Visit (INDEPENDENT_AMBULATORY_CARE_PROVIDER_SITE_OTHER): Payer: BC Managed Care – PPO | Admitting: Internal Medicine

## 2014-03-19 ENCOUNTER — Encounter: Payer: Self-pay | Admitting: Internal Medicine

## 2014-03-19 VITALS — BP 112/70 | HR 98 | Temp 98.2°F | Ht 67.0 in | Wt 259.2 lb

## 2014-03-19 DIAGNOSIS — T783XXA Angioneurotic edema, initial encounter: Secondary | ICD-10-CM

## 2014-03-19 DIAGNOSIS — E119 Type 2 diabetes mellitus without complications: Secondary | ICD-10-CM

## 2014-03-19 DIAGNOSIS — J3489 Other specified disorders of nose and nasal sinuses: Secondary | ICD-10-CM

## 2014-03-19 MED ORDER — DOXYCYCLINE HYCLATE 100 MG PO TABS: 100.0000 mg | ORAL_TABLET | Freq: Two times a day (BID) | ORAL | Status: DC

## 2014-03-19 MED ORDER — PREDNISONE 10 MG PO TABS: ORAL_TABLET | ORAL | Status: DC

## 2014-03-19 MED ORDER — METHYLPREDNISOLONE ACETATE 80 MG/ML IJ SUSP
120.0000 mg | Freq: Once | INTRAMUSCULAR | Status: AC
  Administered 2014-03-19: 120 mg via INTRAMUSCULAR

## 2014-03-19 NOTE — Progress Notes (Signed)
Pre visit review using our clinic review tool, if applicable. No additional management support is needed unless otherwise documented below in the visit note. 

## 2014-03-19 NOTE — Progress Notes (Signed)
Subjective:    Patient ID: Nicole Melendez, female    DOB: Feb 02, 1976, 38 y.o.   MRN: [id number]  HPI  Here to f/u, had a fall down stairs recently when "left hip went out", and c/o some right post lat rib pain, then saw rheum with LBP, had some sort of shot IM left buttock, effect supposed to last up to 3 wks (cant recall name), no help with pain really, but then later may 15 awoke with eyes/face/tongue swelling (iwth pain but without trauma or fever), and some improved with time and benadryl but still significant today, despite 7 days prednisone pack per pt.  Pt denies chest pain, increased sob or doe, wheezing, orthopnea, PND, increased LE swelling, palpitations, dizziness or syncope, except for some mild sob, cut back on exercise, used inhaler, now improved. Last cxr mar 27  - nad.  Hard to speak due to persistent swollen tongue, not yet back to work since being out initialy late April.  Wants to go back in 1 wk Past Medical History  Diagnosis Date  . Morbid obesity   . Peptic ulcer disease   . GERD (gastroesophageal reflux disease)   . Migraine   . Anxiety   . Anemia   . Hyperlipidemia   . Menorrhagia   . PNA (pneumonia) july 2011  . Diabetes mellitus type II     steroid related  . Depression   . Sarcoid     including hand per rheumatology-Dr. Amil Amen  . Allergic rhinitis, cause unspecified   . Varicose veins with pain   . Positive ANA (antinuclear antibody) 02/14/2012  . Shortness of breath     on exertion  . History of blood transfusion   . Sarcoidosis of lung    Past Surgical History  Procedure Laterality Date  . Uterine ablation  03/2010  . Wisdom tooth extraction    . Abdominal hysterectomy      reports that she quit smoking about 16 months ago. Her smoking use included Cigarettes. She has a 20 pack-year smoking history. She does not have any smokeless tobacco history on file. She reports that she does not drink alcohol or use illicit drugs. family history includes  Allergies in her mother; Bone cancer in her maternal aunt; Breast cancer in her maternal aunt; Diabetes in an other family member; Heart attack in her mother; Heart disease in her father; Hypertension in an other family member; Lung cancer in her maternal aunt; Ovarian cancer in her maternal aunt; Rheum arthritis in her father. There is no history of Other. Allergies  Allergen Reactions  . Azithromycin     (z pak) hives  . Oxycodone-Acetaminophen     REACTION: difficulty breathing  . Penicillins     REACTION: swelling and difficulty breathing  . Klonopin [Clonazepam] Other (See Comments)    Memory difficulty   Current Outpatient Prescriptions on File Prior to Visit  Medication Sig Dispense Refill  . acetaminophen (TYLENOL) 500 MG tablet Take 1,000 mg by mouth every 6 (six) hours as needed. For pain      . ALPRAZolam (XANAX) 0.5 MG tablet TAKE 1 TABLET BY MOUTH THREE TIMES DAILY AS NEEDED FOR ANXIETY  90 tablet  2  . B-D UF III MINI PEN NEEDLES 31G X 5 MM MISC USE FOUR TIMES DAILY FOR SLIDING SCALE AS NEEDED  200 each  0  . budesonide-formoterol (SYMBICORT) 160-4.5 MCG/ACT inhaler Inhale 2 puffs into the lungs 2 (two) times daily.      Marland Kitchen  cetirizine (ZYRTEC) 10 MG tablet Take 1 tablet (10 mg total) by mouth daily.  30 tablet  11  . fluticasone (FLONASE) 50 MCG/ACT nasal spray Place 2 sprays into the nose daily as needed. For allergies      . insulin aspart (NOVOLOG) 100 UNIT/ML injection Inject into the skin 3 (three) times daily with meals. Sliding scale      . meloxicam (MOBIC) 7.5 MG tablet 1 bid  20 tablet  0  . methotrexate (RHEUMATREX) 2.5 MG tablet TAKE 4 TABLETS BY MOUTH EVERY FRIDAY NIGHT. PROTECT FROM LIGHT.  16 tablet  0  . sertraline (ZOLOFT) 100 MG tablet 2 tabs by mouth per day  180 tablet  3  . sertraline (ZOLOFT) 50 MG tablet 2 tabs by mouth per day  60 tablet  11  . XOPENEX HFA 45 MCG/ACT inhaler INHALE 1-2 PUFFS BY MOUTH EVERY 4 HOURS AS NEEDED FOR WHEEZING  15 g  11   No  current facility-administered medications on file prior to visit.   Review of Systems  Constitutional: Negative for unusual diaphoresis or other sweats  HENT: Negative for ringing in ear Eyes: Negative for double vision or worsening visual disturbance.  Respiratory: Negative for choking and stridor.   Gastrointestinal: Negative for vomiting or other signifcant bowel change Genitourinary: Negative for hematuria or decreased urine volume.  Musculoskeletal: Negative for other MSK pain or swelling Skin: Negative for color change and worsening wound.  Neurological: Negative for tremors and numbness other than noted  Psychiatric/Behavioral: Negative for decreased concentration or agitation other than above       Objective:   Physical Exam BP 112/70  Pulse 98  Temp(Src) 98.2 F (36.8 C) (Oral)  Ht 5\' 7"  (1.702 m)  Wt 259 lb 4 oz (117.595 kg)  BMI 40.59 kg/m2  SpO2 98%  LMP 07/17/2012 VS noted, voice with "thick tongue" with slurred but mostly understandable speech Constitutional: Pt appears well-developed, well-nourished.  HENT: Head: NCAT.  Right Ear: External ear normal.  Left Ear: External ear normal.  Eyes: . Pupils are equal, round, and reactive to light. Conjunctivae and EOM are normal  Has trace upper and lower eyelid puffiness but 2+ bilat facial edema, and swelling tongue Bilat tm's with no erythema.  Max sinus areas mod tender.  Pharynx with mild erythema, no exudate Neck: Normal range of motion. Neck supple.  Cardiovascular: Normal rate and regular rhythm.   Pulmonary/Chest: Effort normal and breath sounds normal.  - no rales or wheezing Neurological: Pt is alert. Not confused , motor grossly intact Skin: Skin is warm. No rash Psychiatric: Pt behavior is normal. No agitation. still somewhat depressed affect     Assessment & Plan:

## 2014-03-19 NOTE — Assessment & Plan Note (Signed)
?   Underlying sinusitis, with pain out of proportion to recent allergy rxn, for doxy course

## 2014-03-19 NOTE — Assessment & Plan Note (Signed)
Though improved from onset, still dramatic with facial and tongue swelling, has been avoiding the Er due to wait time, only some improved with recent predpack per rheum, for depomedrol IM today, further predpack asd, cont benadryl prn, will likely be ok for return to work June 1

## 2014-03-19 NOTE — Assessment & Plan Note (Signed)
stable overall by history and exam, recent data reviewed with pt, and pt to continue medical treatment as before,  to f/u any worsening symptoms or concerns Lab Results  Component Value Date   HGBA1C 6.1 01/08/2013   Follow closely on steroid tx

## 2014-03-19 NOTE — Patient Instructions (Signed)
You had the steroid shot today  Please take all new medication as prescribed - the antibiotic, and the prednisone  Please check your sugars to make sure they stay at least less than 200  Please continue all other medications as before, and refills have been done if requested. Please have the pharmacy call with any other refills you may need.  Please continue your efforts at being more active, low cholesterol diet, and weight control.  Please keep your appointments with your specialists as you have planned  You should be ok to return to work March 25, 2014, without restrictions

## 2014-03-22 DIAGNOSIS — Z0279 Encounter for issue of other medical certificate: Secondary | ICD-10-CM

## 2014-03-27 DIAGNOSIS — Z0279 Encounter for issue of other medical certificate: Secondary | ICD-10-CM

## 2014-04-11 ENCOUNTER — Ambulatory Visit: Payer: BC Managed Care – PPO | Admitting: Psychiatry

## 2014-04-24 ENCOUNTER — Other Ambulatory Visit: Payer: Self-pay | Admitting: Internal Medicine

## 2014-04-29 ENCOUNTER — Telehealth: Payer: Self-pay | Admitting: *Deleted

## 2014-04-29 DIAGNOSIS — E119 Type 2 diabetes mellitus without complications: Secondary | ICD-10-CM

## 2014-04-29 NOTE — Telephone Encounter (Signed)
Patient coming in for an appointment. Lipid, a1c, bmet ordered Diabetic bundle.

## 2014-04-30 ENCOUNTER — Ambulatory Visit (INDEPENDENT_AMBULATORY_CARE_PROVIDER_SITE_OTHER): Payer: BC Managed Care – PPO | Admitting: Psychiatry

## 2014-04-30 DIAGNOSIS — F410 Panic disorder [episodic paroxysmal anxiety] without agoraphobia: Secondary | ICD-10-CM

## 2014-04-30 DIAGNOSIS — F3289 Other specified depressive episodes: Secondary | ICD-10-CM

## 2014-04-30 DIAGNOSIS — F329 Major depressive disorder, single episode, unspecified: Secondary | ICD-10-CM

## 2014-05-03 ENCOUNTER — Other Ambulatory Visit: Payer: Self-pay | Admitting: Internal Medicine

## 2014-05-03 ENCOUNTER — Ambulatory Visit: Payer: BC Managed Care – PPO | Admitting: Internal Medicine

## 2014-05-17 ENCOUNTER — Ambulatory Visit: Payer: BC Managed Care – PPO | Admitting: Internal Medicine

## 2014-05-28 ENCOUNTER — Ambulatory Visit: Payer: BC Managed Care – PPO | Admitting: Psychiatry

## 2014-06-08 ENCOUNTER — Other Ambulatory Visit: Payer: Self-pay | Admitting: Pulmonary Disease

## 2014-06-11 ENCOUNTER — Ambulatory Visit: Payer: BC Managed Care – PPO | Admitting: Psychiatry

## 2014-06-13 ENCOUNTER — Other Ambulatory Visit: Payer: Self-pay | Admitting: Pulmonary Disease

## 2014-07-04 ENCOUNTER — Telehealth: Payer: Self-pay | Admitting: Pulmonary Disease

## 2014-07-04 NOTE — Telephone Encounter (Signed)
Called # provided, not correct. Correct # [phone]  Called [facility] x1 for Nicole Melendez. Did not see any surgical clearance

## 2014-07-04 NOTE — Telephone Encounter (Signed)
Spoke with Dr Loyal Gambler  He states that the pt has an abscess tooth and is scheduled to have this taken care of tomorrow  Given her hx of sarcoid he wants verbal ok from [facility]  She states that he will be giving her versed and fentanyl  Appt is tomorrow 07/05/14 at 11:45 am  Please advise asap thanks!

## 2014-07-05 ENCOUNTER — Ambulatory Visit: Payer: BC Managed Care – PPO | Admitting: Internal Medicine

## 2014-07-05 NOTE — Telephone Encounter (Signed)
Spoke with Dr Loyal Gambler and gave Dr Janifer Adie recommendations regarding tooth extraction.

## 2014-07-05 NOTE — Telephone Encounter (Signed)
Should do fine with tooth extraction from a pulmonary standpoint.

## 2014-07-08 ENCOUNTER — Encounter: Payer: Self-pay | Admitting: Pulmonary Disease

## 2014-07-08 ENCOUNTER — Ambulatory Visit (INDEPENDENT_AMBULATORY_CARE_PROVIDER_SITE_OTHER): Payer: BC Managed Care – PPO | Admitting: Pulmonary Disease

## 2014-07-08 VITALS — BP 110/80 | HR 84 | Temp 98.7°F | Ht 67.0 in | Wt 280.0 lb

## 2014-07-08 DIAGNOSIS — D869 Sarcoidosis, unspecified: Secondary | ICD-10-CM

## 2014-07-08 DIAGNOSIS — Z23 Encounter for immunization: Secondary | ICD-10-CM

## 2014-07-08 NOTE — Assessment & Plan Note (Signed)
The patient is doing well from a pulmonary standpoint with respect to sarcoidosis. She is continuing on her bronchodilator regimen, and feels that her breathing overall is stable. She is still on methotrexate and prednisone for her joint complaints, and is being overseen by rheumatology. She has gained significant weight since the last visit, and I have encouraged her to work aggressively on further weight loss. We will also give her a flu shot today.

## 2014-07-08 NOTE — Patient Instructions (Signed)
Will refill your prescriptions, and give you the flu shot today. Keep working on weight loss. followup with me again in 64mos.

## 2014-07-08 NOTE — Progress Notes (Signed)
   Subjective:    Patient ID: Nicole Melendez, female    DOB: Mar 14, 1976, 38 y.o.   MRN: [id number]  HPI The patient comes in today for followup of her known pulmonary sarcoid. She is continuing on methotrexate and low-dose prednisone another direction of rheumatology for her ongoing joint symptoms. She has done very well from a pulmonary standpoint, and is staying on her bronchodilator regimen. She has had very after surgery and had lost a lot of weight, but has gained 35 pounds back since the last visit. She feels that her exertional tolerance is at baseline, and has minimal cough.   Review of Systems  Constitutional: Negative for fever and unexpected weight change.  HENT: Positive for congestion and postnasal drip. Negative for dental problem, ear pain, nosebleeds, rhinorrhea, sinus pressure, sneezing, sore throat and trouble swallowing.   Eyes: Negative for redness and itching.  Respiratory: Positive for cough. Negative for chest tightness, shortness of breath and wheezing.   Cardiovascular: Negative for palpitations and leg swelling.  Gastrointestinal: Negative for nausea and vomiting.  Genitourinary: Negative for dysuria.  Musculoskeletal: Negative for joint swelling.  Skin: Negative for rash.  Neurological: Negative for headaches.  Hematological: Does not bruise/bleed easily.  Psychiatric/Behavioral: Negative for dysphoric mood. The patient is not nervous/anxious.        Objective:   Physical Exam Obese female in no acute distress Nose without purulence or discharge noted Neck without lymphadenopathy or thyromegaly Chest fairly clear except for a rare crackle Cardiac exam with regular rate and rhythm Lower extremities with minimal edema, no cyanosis Alert and oriented, moves all 4 extremities.       Assessment & Plan:

## 2014-07-11 ENCOUNTER — Other Ambulatory Visit: Payer: Self-pay | Admitting: Pulmonary Disease

## 2014-07-12 ENCOUNTER — Other Ambulatory Visit: Payer: Self-pay | Admitting: Internal Medicine

## 2014-07-12 NOTE — Telephone Encounter (Signed)
Done hardcopy to robin  

## 2014-07-12 NOTE — Telephone Encounter (Signed)
Faxed hardcopy for Alprazolam [facility]

## 2014-10-15 ENCOUNTER — Other Ambulatory Visit: Payer: Self-pay | Admitting: Internal Medicine

## 2014-10-15 NOTE — Telephone Encounter (Signed)
Done hardcopy to robin  

## 2014-10-15 NOTE — Telephone Encounter (Signed)
Faxed hardcopy for [facility] for Alprazolam

## 2014-10-17 ENCOUNTER — Other Ambulatory Visit (INDEPENDENT_AMBULATORY_CARE_PROVIDER_SITE_OTHER): Payer: BC Managed Care – PPO

## 2014-10-17 ENCOUNTER — Ambulatory Visit (INDEPENDENT_AMBULATORY_CARE_PROVIDER_SITE_OTHER): Payer: BC Managed Care – PPO | Admitting: Internal Medicine

## 2014-10-17 ENCOUNTER — Encounter: Payer: Self-pay | Admitting: Internal Medicine

## 2014-10-17 VITALS — BP 112/84 | HR 96 | Temp 98.6°F | Ht 67.0 in | Wt 297.0 lb

## 2014-10-17 DIAGNOSIS — E119 Type 2 diabetes mellitus without complications: Secondary | ICD-10-CM

## 2014-10-17 DIAGNOSIS — F411 Generalized anxiety disorder: Secondary | ICD-10-CM

## 2014-10-17 DIAGNOSIS — G43909 Migraine, unspecified, not intractable, without status migrainosus: Secondary | ICD-10-CM

## 2014-10-17 DIAGNOSIS — E785 Hyperlipidemia, unspecified: Secondary | ICD-10-CM

## 2014-10-17 DIAGNOSIS — Z0189 Encounter for other specified special examinations: Secondary | ICD-10-CM

## 2014-10-17 DIAGNOSIS — Z0000 Encounter for general adult medical examination without abnormal findings: Secondary | ICD-10-CM

## 2014-10-17 LAB — HEPATIC FUNCTION PANEL
ALK PHOS: 115 U/L (ref 39–117)
ALT: 11 U/L (ref 0–35)
AST: 16 U/L (ref 0–37)
Albumin: 3.9 g/dL (ref 3.5–5.2)
BILIRUBIN TOTAL: 0.5 mg/dL (ref 0.2–1.2)
Bilirubin, Direct: 0 mg/dL (ref 0.0–0.3)
Total Protein: 7.6 g/dL (ref 6.0–8.3)

## 2014-10-17 LAB — LIPID PANEL
CHOL/HDL RATIO: 4
Cholesterol: 210 mg/dL — ABNORMAL HIGH (ref 0–200)
HDL: 58.3 mg/dL (ref >39.00)
LDL CALC: 132 mg/dL — AB (ref 0–99)
NONHDL: 151.7
Triglycerides: 101 mg/dL (ref 0.0–149.0)
VLDL: 20.2 mg/dL (ref 0.0–40.0)

## 2014-10-17 LAB — BASIC METABOLIC PANEL
BUN: 7 mg/dL (ref 6–23)
CALCIUM: 9.2 mg/dL (ref 8.4–10.5)
CO2: 24 meq/L (ref 19–32)
CREATININE: 0.8 mg/dL (ref 0.4–1.2)
Chloride: 105 mEq/L (ref 96–112)
GFR: 107.97 mL/min (ref >60.00)
Glucose, Bld: 101 mg/dL — ABNORMAL HIGH (ref 70–99)
Potassium: 4.3 mEq/L (ref 3.5–5.1)
SODIUM: 138 meq/L (ref 135–145)

## 2014-10-17 LAB — HEMOGLOBIN A1C: HEMOGLOBIN A1C: 5.7 % (ref 4.6–6.5)

## 2014-10-17 MED ORDER — LEVALBUTEROL TARTRATE 45 MCG/ACT IN AERO: 1.0000 | INHALATION_SPRAY | Freq: Two times a day (BID) | RESPIRATORY_TRACT | Status: DC

## 2014-10-17 MED ORDER — ALPRAZOLAM 0.5 MG PO TABS: 0.5000 mg | ORAL_TABLET | Freq: Three times a day (TID) | ORAL | Status: DC | PRN

## 2014-10-17 MED ORDER — BUDESONIDE-FORMOTEROL FUMARATE 160-4.5 MCG/ACT IN AERO: 2.0000 | INHALATION_SPRAY | Freq: Two times a day (BID) | RESPIRATORY_TRACT | Status: DC

## 2014-10-17 MED ORDER — NAPROXEN 500 MG PO TABS: 500.0000 mg | ORAL_TABLET | Freq: Two times a day (BID) | ORAL | Status: DC

## 2014-10-17 MED ORDER — SERTRALINE HCL 100 MG PO TABS: ORAL_TABLET | ORAL | Status: DC

## 2014-10-17 MED ORDER — FLUTICASONE PROPIONATE 50 MCG/ACT NA SUSP: 2.0000 | Freq: Every day | NASAL | Status: DC | PRN

## 2014-10-17 MED ORDER — CYCLOBENZAPRINE HCL 5 MG PO TABS: 5.0000 mg | ORAL_TABLET | Freq: Every evening | ORAL | Status: DC | PRN

## 2014-10-17 NOTE — Patient Instructions (Signed)

## 2014-10-17 NOTE — Progress Notes (Signed)
Subjective:    Patient ID: Nicole Melendez, female    DOB: 05/30/1976, 38 y.o.   MRN: [id number]  HPI  Here to f/u; overall doing ok,  Pt denies chest pain, increased sob or doe, wheezing, orthopnea, PND, increased LE swelling, palpitations, dizziness or syncope.  Pt denies polydipsia, polyuria, or low sugar symptoms such as weakness or confusion improved with po intake.  Pt denies new neurological symptoms such as new headache, or facial or extremity weakness or numbness.   Pt states overall good compliance with meds, has been trying to follow lower cholesterol, diabetic diet,  but little exercise however. Did have fall down stair may 2015 with hairline pelvic fx, difficult recovery and wt gain 60 lbs (but only 38 lbs documented) Wt Readings from Last 3 Encounters:  10/17/14 297 lb (134.718 kg)  07/08/14 280 lb (127.007 kg)  03/19/14 259 lb 4 oz (117.595 kg)  Last a1c mar 2014 - 6.1, but 6.8 prior secondary to steroid.  Also still on chronic prednisone low dose, off mtx, but also on naproxen bid prn, and flexreil qhs prn spasms.  Trying to be more active, has more stable work schedule, plans to lose wt, goal 225 for now.   Denies worsening depressive symptoms, suicidal ideation, or panic Past Medical History  Diagnosis Date  . Morbid obesity   . Peptic ulcer disease   . GERD (gastroesophageal reflux disease)   . Migraine   . Anxiety   . Anemia   . Hyperlipidemia   . Menorrhagia   . PNA (pneumonia) july 2011  . Diabetes mellitus type II     steroid related  . Depression   . Sarcoid     including hand per rheumatology-Dr. Amil Amen  . Allergic rhinitis, cause unspecified   . Varicose veins with pain   . Positive ANA (antinuclear antibody) 02/14/2012  . Shortness of breath     on exertion  . History of blood transfusion   . Sarcoidosis of lung    Past Surgical History  Procedure Laterality Date  . Uterine ablation  03/2010  . Wisdom tooth extraction    . Abdominal hysterectomy       reports that she quit smoking about 1 years ago. Her smoking use included Cigarettes. She has a 20 pack-year smoking history. She does not have any smokeless tobacco history on file. She reports that she does not drink alcohol or use illicit drugs. family history includes Allergies in her mother; Bone cancer in her maternal aunt; Breast cancer in her maternal aunt; Diabetes in an other family member; Heart attack in her mother; Heart disease in her father; Hypertension in an other family member; Lung cancer in her maternal aunt; Ovarian cancer in her maternal aunt; Rheum arthritis in her father. There is no history of Other. Allergies  Allergen Reactions  . Azithromycin     (z pak) hives  . Oxycodone-Acetaminophen     REACTION: difficulty breathing  . Penicillins     REACTION: swelling and difficulty breathing  . Klonopin [Clonazepam] Other (See Comments)    Memory difficulty   Current Outpatient Prescriptions on File Prior to Visit  Medication Sig Dispense Refill  . acetaminophen (TYLENOL) 500 MG tablet Take 1,000 mg by mouth every 6 (six) hours as needed. For pain    . B-D UF III MINI PEN NEEDLES 31G X 5 MM MISC USE FOUR TIMES DAILY FOR SLIDING SCALE AS NEEDED 200 each 0  . cetirizine (ZYRTEC) 10 MG tablet Take  1 tablet (10 mg total) by mouth daily. 30 tablet 11  . insulin aspart (NOVOLOG) 100 UNIT/ML injection Inject into the skin 3 (three) times daily with meals. Sliding scale as needed    . NOVOLOG FLEXPEN 100 UNIT/ML FlexPen USE AS NEEDED AS DIRECTED 15 mL 11  . predniSONE (DELTASONE) 10 MG tablet Take 5 mg by mouth daily.     No current facility-administered medications on file prior to visit.   Review of Systems  Constitutional: Negative for unusual diaphoresis or other sweats  HENT: Negative for ringing in ear Eyes: Negative for double vision or worsening visual disturbance.  Respiratory: Negative for choking and stridor.   Gastrointestinal: Negative for vomiting or other  signifcant bowel change Genitourinary: Negative for hematuria or decreased urine volume.  Musculoskeletal: Negative for other MSK pain or swelling Skin: Negative for color change and worsening wound.  Neurological: Negative for tremors and numbness other than noted  Psychiatric/Behavioral: Negative for decreased concentration or agitation other than above       Objective:   Physical Exam BP 112/84 mmHg  Pulse 96  Temp(Src) 98.6 F (37 C) (Oral)  Ht 5\' 7"  (1.702 m)  Wt 297 lb (134.718 kg)  BMI 46.51 kg/m2  SpO2 95%  LMP 07/17/2012 VS noted, not ill appearing Constitutional: Pt appears well-developed, well-nourished.  HENT: Head: NCAT.  Right Ear: External ear normal.  Left Ear: External ear normal.  Eyes: . Pupils are equal, round, and reactive to light. Conjunctivae and EOM are normal Neck: Normal range of motion. Neck supple.  Cardiovascular: Normal rate and regular rhythm.   Pulmonary/Chest: Effort normal and breath sounds without rales or wheezing.  Neurological: Pt is alert. Not confused , motor grossly intact Skin: Skin is warm. No rash Psychiatric: Pt behavior is normal. No agitation. not depressed affect, or overly nervous    Assessment & Plan:

## 2014-10-17 NOTE — Progress Notes (Signed)
Pre visit review using our clinic review tool, if applicable. No additional management support is needed unless otherwise documented below in the visit note. 

## 2014-10-20 NOTE — Assessment & Plan Note (Signed)
stable overall by history and exam, recent data reviewed with pt, and pt to continue medical treatment as before,  to f/u any worsening symptoms or concerns Lab Results  Component Value Date   HGBA1C 5.7 10/17/2014

## 2014-10-20 NOTE — Assessment & Plan Note (Signed)
stable overall by history and exam, recent data reviewed with pt, and pt to continue medical treatment as before,  to f/u any worsening symptoms or concerns Lab Results  Component Value Date   LDLCALC 132* 10/17/2014

## 2014-10-20 NOTE — Assessment & Plan Note (Signed)
stable overall by history and exam, and pt to continue medical treatment as before,  to f/u any worsening symptoms or concerns, FLMA form filled out/signed

## 2014-10-20 NOTE — Assessment & Plan Note (Signed)
stable overall by history and exam, recent data reviewed with pt, and pt to continue medical treatment as before,  to f/u any worsening symptoms or concerns Lab Results  Component Value Date   WBC 6.8 01/18/2014   HGB 12.9 01/18/2014   HCT 38.2 01/18/2014   PLT 309.0 01/18/2014   GLUCOSE 101* 10/17/2014   CHOL 210* 10/17/2014   TRIG 101.0 10/17/2014   HDL 58.30 10/17/2014   LDLDIRECT 135.0 07/05/2011   LDLCALC 132* 10/17/2014   ALT 11 10/17/2014   AST 16 10/17/2014   NA 138 10/17/2014   K 4.3 10/17/2014   CL 105 10/17/2014   CREATININE 0.8 10/17/2014   BUN 7 10/17/2014   CO2 24 10/17/2014   TSH 1.01 01/08/2013   INR 1.34 05/13/2010   HGBA1C 5.7 10/17/2014   MICROALBUR 0.7 01/08/2013

## 2014-10-22 ENCOUNTER — Telehealth: Payer: Self-pay | Admitting: Internal Medicine

## 2014-10-22 MED ORDER — INSULIN ASPART 100 UNIT/ML ~~LOC~~ SOLN: SUBCUTANEOUS | Status: DC

## 2014-10-22 NOTE — Telephone Encounter (Signed)
Pt called request refill for Novolog, pt stated that she got a massage stating that it need an authorization for it (not sure why). Please check

## 2014-11-14 ENCOUNTER — Encounter: Payer: Self-pay | Admitting: Internal Medicine

## 2014-11-14 ENCOUNTER — Ambulatory Visit (INDEPENDENT_AMBULATORY_CARE_PROVIDER_SITE_OTHER): Payer: BLUE CROSS/BLUE SHIELD | Admitting: Internal Medicine

## 2014-11-14 VITALS — BP 134/84 | HR 93 | Temp 98.4°F | Ht 67.0 in | Wt 303.5 lb

## 2014-11-14 DIAGNOSIS — E785 Hyperlipidemia, unspecified: Secondary | ICD-10-CM

## 2014-11-14 DIAGNOSIS — R062 Wheezing: Secondary | ICD-10-CM

## 2014-11-14 DIAGNOSIS — Z0189 Encounter for other specified special examinations: Secondary | ICD-10-CM

## 2014-11-14 DIAGNOSIS — J069 Acute upper respiratory infection, unspecified: Secondary | ICD-10-CM

## 2014-11-14 DIAGNOSIS — E119 Type 2 diabetes mellitus without complications: Secondary | ICD-10-CM

## 2014-11-14 DIAGNOSIS — Z0000 Encounter for general adult medical examination without abnormal findings: Secondary | ICD-10-CM

## 2014-11-14 MED ORDER — LEVOFLOXACIN 250 MG PO TABS: 250.0000 mg | ORAL_TABLET | Freq: Every day | ORAL | Status: DC

## 2014-11-14 MED ORDER — KETOROLAC TROMETHAMINE 30 MG/ML IJ SOLN
30.0000 mg | Freq: Once | INTRAMUSCULAR | Status: AC
  Administered 2014-11-14: 30 mg via INTRAMUSCULAR

## 2014-11-14 MED ORDER — METHYLPREDNISOLONE ACETATE 80 MG/ML IJ SUSP
80.0000 mg | Freq: Once | INTRAMUSCULAR | Status: AC
  Administered 2014-11-14: 80 mg via INTRAMUSCULAR

## 2014-11-14 MED ORDER — INSULIN ASPART 100 UNIT/ML ~~LOC~~ SOLN: SUBCUTANEOUS | Status: DC

## 2014-11-14 MED ORDER — HYDROCODONE-HOMATROPINE 5-1.5 MG/5ML PO SYRP: 5.0000 mL | ORAL_SOLUTION | Freq: Four times a day (QID) | ORAL | Status: DC | PRN

## 2014-11-14 NOTE — Assessment & Plan Note (Signed)
Recent a1c good -  Lab Results  Component Value Date   HGBA1C 5.7 10/17/2014   Ok to continue SSI novolog for now, sugar likely up and down related to acute illness

## 2014-11-14 NOTE — Patient Instructions (Addendum)
You had the steroid shot today, and the pain shot (toradol)  Please take all new medication as prescribed - the antibiotic, and cough medicine  Please continue all other medications as before, including the insulin as prescribed  Please have the pharmacy call with any other refills you may need.  Please continue your efforts at being more active, low cholesterol diet, and weight control.  Please keep your appointments with your specialists as you may have planned  Please return in 6 months, or sooner if needed, with Lab testing done 3-5 days before

## 2014-11-14 NOTE — Progress Notes (Signed)
Subjective:    Patient ID: Nicole Melendez, female    DOB: 09/26/1976, 39 y.o.   MRN: [id number]  HPI  Here with 2-3 days acute onset fever, facial pain, pressure, headache, general weakness and malaise, and greenish d/c, with mild ST and cough, but pt denies chest pain, wheezing, increased sob or doe, orthopnea, PND, increased LE swelling, palpitations, dizziness or syncope. Sick people at work but cant always stay away.  BS has been up and down since last visit, hih of 369, lowest approx 52. Still taking prednisone 5 mg per day. Pt denies new neurological symptoms such as new headache, or facial or extremity weakness or numbness Admits to not watching cholesterol in diet recently. Past Medical History  Diagnosis Date  . Morbid obesity   . Peptic ulcer disease   . GERD (gastroesophageal reflux disease)   . Migraine   . Anxiety   . Anemia   . Hyperlipidemia   . Menorrhagia   . PNA (pneumonia) july 2011  . Diabetes mellitus type II     steroid related  . Depression   . Sarcoid     including hand per rheumatology-Dr. Amil Amen  . Allergic rhinitis, cause unspecified   . Varicose veins with pain   . Positive ANA (antinuclear antibody) 02/14/2012  . Shortness of breath     on exertion  . History of blood transfusion   . Sarcoidosis of lung    Past Surgical History  Procedure Laterality Date  . Uterine ablation  03/2010  . Wisdom tooth extraction    . Abdominal hysterectomy      reports that she quit smoking about 2 years ago. Her smoking use included Cigarettes. She has a 20 pack-year smoking history. She does not have any smokeless tobacco history on file. She reports that she does not drink alcohol or use illicit drugs. family history includes Allergies in her mother; Bone cancer in her maternal aunt; Breast cancer in her maternal aunt; Diabetes in an other family member; Heart attack in her mother; Heart disease in her father; Hypertension in an other family member; Lung cancer in  her maternal aunt; Ovarian cancer in her maternal aunt; Rheum arthritis in her father. There is no history of Other. Allergies  Allergen Reactions  . Azithromycin     (z pak) hives  . Oxycodone-Acetaminophen     REACTION: difficulty breathing  . Penicillins     REACTION: swelling and difficulty breathing  . Klonopin [Clonazepam] Other (See Comments)    Memory difficulty   Current Outpatient Prescriptions on File Prior to Visit  Medication Sig Dispense Refill  . acetaminophen (TYLENOL) 500 MG tablet Take 1,000 mg by mouth every 6 (six) hours as needed. For pain    . ALPRAZolam (XANAX) 0.5 MG tablet Take 1 tablet (0.5 mg total) by mouth 3 (three) times daily as needed. for anxiety 90 tablet 2  . B-D UF III MINI PEN NEEDLES 31G X 5 MM MISC USE FOUR TIMES DAILY FOR SLIDING SCALE AS NEEDED 200 each 0  . budesonide-formoterol (SYMBICORT) 160-4.5 MCG/ACT inhaler Inhale 2 puffs into the lungs 2 (two) times daily. 1 Inhaler 11  . cetirizine (ZYRTEC) 10 MG tablet Take 1 tablet (10 mg total) by mouth daily. 30 tablet 11  . cyclobenzaprine (FLEXERIL) 5 MG tablet Take 1 tablet (5 mg total) by mouth at bedtime as needed for muscle spasms. 30 tablet 3  . fluticasone (FLONASE) 50 MCG/ACT nasal spray Place 2 sprays into both nostrils daily  as needed. For allergies 16 g 11  . levalbuterol (XOPENEX HFA) 45 MCG/ACT inhaler Inhale 1-2 puffs into the lungs 2 (two) times daily. 15 g 11  . naproxen (NAPROSYN) 500 MG tablet Take 1 tablet (500 mg total) by mouth 2 (two) times daily with a meal. 60 tablet 3  . NOVOLOG FLEXPEN 100 UNIT/ML FlexPen USE AS NEEDED AS DIRECTED 15 mL 11  . predniSONE (DELTASONE) 10 MG tablet Take 5 mg by mouth daily.    . sertraline (ZOLOFT) 100 MG tablet 2 tabs by mouth per day 180 tablet 3   No current facility-administered medications on file prior to visit.   Review of Systems  Constitutional: Negative for unusual diaphoresis or other sweats  HENT: Negative for ringing in  ear Eyes: Negative for double vision or worsening visual disturbance.  Respiratory: Negative for choking and stridor.   Gastrointestinal: Negative for vomiting or other signifcant bowel change Genitourinary: Negative for hematuria or decreased urine volume.  Musculoskeletal: Negative for other MSK pain or swelling Skin: Negative for color change and worsening wound.  Neurological: Negative for tremors and numbness other than noted  Psychiatric/Behavioral: Negative for decreased concentration or agitation other than above       Objective:   Physical Exam BP 134/84 mmHg  Pulse 93  Temp(Src) 98.4 F (36.9 C) (Oral)  Ht 5\' 7"  (1.702 m)  Wt 303 lb 8 oz (137.667 kg)  BMI 47.52 kg/m2  SpO2 95%  LMP 07/17/2012 VS noted, mild ill Constitutional: Pt appears well-developed, well-nourished.  HENT: Head: NCAT.  Right Ear: External ear normal.  Left Ear: External ear normal.  Eyes: . Pupils are equal, round, and reactive to light. Conjunctivae and EOM are normal Bilat tm's with mild erythema.  Max sinus areas mild tender.  Pharynx with mild erythema, no exudate Neck: Normal range of motion. Neck supple.  Cardiovascular: Normal rate and regular rhythm.   Pulmonary/Chest: Effort normal and decresaed breath sounds without rales but with mild wheezing.  Neurological: Pt is alert. Not confused , motor grossly intact Skin: Skin is warm. No rash Psychiatric: Pt behavior is normal. No agitation.     Assessment & Plan:

## 2014-11-14 NOTE — Progress Notes (Signed)
Pre visit review using our clinic review tool, if applicable. No additional management support is needed unless otherwise documented below in the visit note. 

## 2014-11-14 NOTE — Assessment & Plan Note (Signed)
Mild to mod, for antibx course,  to f/u any worsening symptoms or concerns, also for toradol IM due to severe HA associated

## 2014-11-14 NOTE — Assessment & Plan Note (Signed)
Declines statin , for lower chol diet,  Lab Results  Component Value Date   LDLCALC 132* 10/17/2014   Goal ldl < 100  to f/u any worsening symptoms or concerns

## 2014-11-14 NOTE — Assessment & Plan Note (Signed)
Mild to mod, for depomedrol IM,  to f/u any worsening symptoms or concerns 

## 2014-11-22 ENCOUNTER — Telehealth: Payer: Self-pay

## 2014-11-22 MED ORDER — INSULIN LISPRO 100 UNIT/ML ~~LOC~~ SOLN: 12.0000 [IU] | Freq: Three times a day (TID) | SUBCUTANEOUS | Status: DC

## 2014-11-22 NOTE — Telephone Encounter (Signed)
Insurance does not cover novolog, but will do humalog.. Advise please

## 2014-11-22 NOTE — Telephone Encounter (Signed)
RX changed 

## 2014-11-22 NOTE — Telephone Encounter (Signed)
Prescription faxed in.

## 2014-11-22 NOTE — Telephone Encounter (Signed)
Called the patient left a detailed medication has been changed as she requested.

## 2014-12-04 ENCOUNTER — Other Ambulatory Visit: Payer: Self-pay | Admitting: Pulmonary Disease

## 2014-12-10 ENCOUNTER — Telehealth: Payer: Self-pay | Admitting: Pulmonary Disease

## 2014-12-10 MED ORDER — PREDNISONE 10 MG PO TABS: 5.0000 mg | ORAL_TABLET | Freq: Every day | ORAL | Status: DC

## 2014-12-10 NOTE — Telephone Encounter (Signed)
Pt has upcoming appointment on 01/06/15. Advised pt that I would give enough medication to last until then but she needs to make sure she keeps her appointment. She agreed and verbalized understanding.

## 2014-12-31 ENCOUNTER — Encounter: Payer: Self-pay | Admitting: Internal Medicine

## 2015-01-06 ENCOUNTER — Ambulatory Visit: Payer: Self-pay | Admitting: Pulmonary Disease

## 2015-01-10 ENCOUNTER — Telehealth: Payer: Self-pay | Admitting: Pulmonary Disease

## 2015-01-10 MED ORDER — PREDNISONE 10 MG PO TABS: 5.0000 mg | ORAL_TABLET | Freq: Every day | ORAL | Status: DC

## 2015-01-10 NOTE — Telephone Encounter (Signed)
Pt states its ok to leave detailed message on vm she works 2nd shift

## 2015-01-10 NOTE — Telephone Encounter (Signed)
Rx sent to pharmacy. Patient notified via voicemail. Nothing further needed.

## 2015-01-10 NOTE — Telephone Encounter (Signed)
Ok to give her enough to get to the April app;tm

## 2015-01-10 NOTE — Telephone Encounter (Signed)
Patient requesting refill on Prednisone to last until she can come in for her next OV.  She is currently taking 5mg  daily.  Next OV 02/06/15.    Tracy - ok to refill Prednisone?  Please advise.

## 2015-01-22 ENCOUNTER — Encounter: Payer: Self-pay | Admitting: Internal Medicine

## 2015-01-22 ENCOUNTER — Ambulatory Visit (INDEPENDENT_AMBULATORY_CARE_PROVIDER_SITE_OTHER): Payer: BLUE CROSS/BLUE SHIELD | Admitting: Internal Medicine

## 2015-01-22 ENCOUNTER — Other Ambulatory Visit (INDEPENDENT_AMBULATORY_CARE_PROVIDER_SITE_OTHER): Payer: BLUE CROSS/BLUE SHIELD

## 2015-01-22 VITALS — BP 130/90 | HR 81 | Temp 97.9°F | Resp 15 | Ht 67.0 in | Wt 308.5 lb

## 2015-01-22 DIAGNOSIS — R112 Nausea with vomiting, unspecified: Secondary | ICD-10-CM

## 2015-01-22 DIAGNOSIS — J069 Acute upper respiratory infection, unspecified: Secondary | ICD-10-CM | POA: Diagnosis not present

## 2015-01-22 DIAGNOSIS — R040 Epistaxis: Secondary | ICD-10-CM

## 2015-01-22 DIAGNOSIS — J209 Acute bronchitis, unspecified: Secondary | ICD-10-CM | POA: Diagnosis not present

## 2015-01-22 DIAGNOSIS — E119 Type 2 diabetes mellitus without complications: Secondary | ICD-10-CM

## 2015-01-22 LAB — BASIC METABOLIC PANEL
BUN: 11 mg/dL (ref 6–23)
CHLORIDE: 104 meq/L (ref 96–112)
CO2: 26 mEq/L (ref 19–32)
CREATININE: 0.72 mg/dL (ref 0.40–1.20)
Calcium: 9.2 mg/dL (ref 8.4–10.5)
GFR: 114.78 mL/min (ref >60.00)
GLUCOSE: 94 mg/dL (ref 70–99)
POTASSIUM: 3.9 meq/L (ref 3.5–5.1)
Sodium: 135 mEq/L (ref 135–145)

## 2015-01-22 LAB — HEMOGLOBIN A1C: HEMOGLOBIN A1C: 5.8 % (ref 4.6–6.5)

## 2015-01-22 MED ORDER — SULFAMETHOXAZOLE-TRIMETHOPRIM 800-160 MG PO TABS: 1.0000 | ORAL_TABLET | Freq: Two times a day (BID) | ORAL | Status: DC

## 2015-01-22 NOTE — Progress Notes (Signed)
Pre visit review using our clinic review tool, if applicable. No additional management support is needed unless otherwise documented below in the visit note. 

## 2015-01-22 NOTE — Patient Instructions (Addendum)
Stay on clear liquids for 48-72 hours or until vomiting resolved.This would include  jello, sherbert (NOT ice cream), Li[employer] chicken noodle soup(NOT cream based soups),Gatorade Lite, flat Ginger ale (without High Fructose Corn Syrup),dry toast or crackers, baked potato.No milk , dairy or grease until well. Align , a [employer] , daily if stools are loose. Immodium AD for frankly watery stool. Report increasing pain, fever or rectal bleeding  Plain Mucinex (NOT D) for thick secretions ;force NON dairy fluids .   Flonase OR Nasacort AQ 1 spray in each nostril twice a day as needed. Use the "crossover" technique into opposite nostril spraying toward opposite ear @ 45 degree angle, not straight up into nostril.  Plain Allegra (NOT D )  160 daily , Loratidine 10 mg , OR Zyrtec 10 mg @ bedtime  as needed for itchy eyes & sneezing.   Your next office appointment will be determined based upon review of your pending labs 7/or xrays  Those instructions will be transmitted to you by My Chart  Critical results will be called.   Followup as needed for any active or acute issue. Please report any significant change in your symptoms.

## 2015-01-22 NOTE — Progress Notes (Signed)
Subjective:    Patient ID: Nicole Melendez, female    DOB: 1976/01/24, 39 y.o.   MRN: [id number]  HPI  Her symptoms began 01/18/15 has sneezing followed by epistaxis. She does use Flonase and her technique was reviewed. She squirts it straight up @ the nasal septum. She has no other bleeding dyscrasias  As of 07/22/15 she developed nausea and vomiting with production of gastric juices without hematemesis. She did cough up some of the blood which had drained from the nose. Since then she has had nausea  & vomiting at least 4 times a day.  She is a diabetic on insulin only if fasting blood sugars are over 150. Her fasting blood sugars range 42 (!!!)-101. Her A1c was 5.7% on 10/17/14.No hypoglycemic symptoms described.  She has had some ear pain with some foamy discharge. She's had chills and sweats. She has frontal headache. She describes yellow secretions greater from the chest than from the head. She also has sneezing and itching.   Review of Systems Hemoptysis, hematuria, melena, or rectal bleeding denied. No unexplained weight loss, significant dyspepsia,dysphagia, or abdominal pain.  There is no abnormal bruising , bleeding, or difficulty stopping bleeding with injury.  She denies dysuria, pyuria, hematuria.  She has no constipation or diarrhea.  The present illness is not associated with fever or facial pain.     Objective:   Physical Exam  Pertinent or positive findings include: There is severe erythema of the nasal septum.  She has minimal tenderness in the left upper quadrant.  She has slight crepitus of the knees.  Gen.: Adequately nourished in appearance. Alert, appropriate and cooperative throughout exam. BMI: 48.31 Head: Normocephalic without obvious abnormalities  Eyes: No corneal or conjunctival inflammation noted. Pupils equal round reactive to light and accommodation. Extraocular motion intact. No icterus. Ears: External  ear exam reveals no significant lesions or  deformities. Canals clear .TMs normal. Hearing is grossly normal bilaterally. Nose: External nasal exam reveals no deformity or inflammation. No lesions or exudates noted.   Mouth: Oral mucosa and oropharynx reveal no lesions or exudates. Teeth in good repair.Tongue moist. Neck: No deformities, masses, or tenderness noted. Range of motion & Thyroid normal. Lungs: Normal respiratory effort; chest expands symmetrically. Lungs are clear to auscultation without rales, wheezes, or increased work of breathing. Heart: Normal rate and rhythm. Normal S1 and S2. No gallop, click, or rub. No  murmur. Abdomen: Bowel sounds normal; abdomen soft . No masses, organomegaly or hernias noted. Musculoskeletal/extremities: No deformity or scoliosis noted of  the thoracic or lumbar spine.  No clubbing, cyanosis, edema, or significant extremity  deformity noted.  Range of motion normal . Tone & strength normal. Hand joints normal  Fingernail  health good. Able to lie down & sit up w/o help.  Negative SLR bilaterally Vascular: Carotid, radial artery, dorsalis pedis and  posterior tibial pulses are full and equal. No bruits present. Neurologic: Alert and oriented x3. Deep tendon reflexes symmetrical and normal.  Gait normal    Skin: Intact without suspicious lesions or rashes. Lymph: No cervical, axillary lymphadenopathy present. Psych: Mood and affect are normal. Normally interactive  Assessment & Plan:  #1 acute bronchitis  #2 upper respiratory infection  #3 nausea & vomiting possibly related to postnasal drainage and epistaxis  #4 epistaxis due to to nasal septum irritation  #5 diabetes;? Status. She is on as needed insulin  Plan: See orders and recommendations

## 2015-01-23 ENCOUNTER — Other Ambulatory Visit: Payer: Self-pay | Admitting: Internal Medicine

## 2015-01-23 NOTE — Telephone Encounter (Signed)
Sent to pharmacy 

## 2015-01-23 NOTE — Telephone Encounter (Signed)
Done hardcopy to Cherina  

## 2015-01-27 ENCOUNTER — Ambulatory Visit: Payer: Self-pay | Admitting: Pulmonary Disease

## 2015-02-04 ENCOUNTER — Emergency Department (HOSPITAL_COMMUNITY): Payer: BLUE CROSS/BLUE SHIELD

## 2015-02-04 ENCOUNTER — Emergency Department (HOSPITAL_COMMUNITY)
Admission: EM | Admit: 2015-02-04 | Discharge: 2015-02-05 | Disposition: A | Discharge status: Home / Self Care | Payer: BLUE CROSS/BLUE SHIELD | Attending: Emergency Medicine | Admitting: Emergency Medicine

## 2015-02-04 ENCOUNTER — Encounter (HOSPITAL_COMMUNITY): Payer: Self-pay

## 2015-02-04 DIAGNOSIS — Z794 Long term (current) use of insulin: Secondary | ICD-10-CM | POA: Diagnosis not present

## 2015-02-04 DIAGNOSIS — Z87891 Personal history of nicotine dependence: Secondary | ICD-10-CM | POA: Insufficient documentation

## 2015-02-04 DIAGNOSIS — K219 Gastro-esophageal reflux disease without esophagitis: Secondary | ICD-10-CM | POA: Insufficient documentation

## 2015-02-04 DIAGNOSIS — E119 Type 2 diabetes mellitus without complications: Secondary | ICD-10-CM | POA: Insufficient documentation

## 2015-02-04 DIAGNOSIS — Z7952 Long term (current) use of systemic steroids: Secondary | ICD-10-CM | POA: Insufficient documentation

## 2015-02-04 DIAGNOSIS — Z862 Personal history of diseases of the blood and blood-forming organs and certain disorders involving the immune mechanism: Secondary | ICD-10-CM | POA: Insufficient documentation

## 2015-02-04 DIAGNOSIS — Z7951 Long term (current) use of inhaled steroids: Secondary | ICD-10-CM | POA: Insufficient documentation

## 2015-02-04 DIAGNOSIS — Z880 Allergy status to penicillin: Secondary | ICD-10-CM | POA: Diagnosis not present

## 2015-02-04 DIAGNOSIS — F419 Anxiety disorder, unspecified: Secondary | ICD-10-CM | POA: Diagnosis not present

## 2015-02-04 DIAGNOSIS — Z8701 Personal history of pneumonia (recurrent): Secondary | ICD-10-CM | POA: Insufficient documentation

## 2015-02-04 DIAGNOSIS — Z79899 Other long term (current) drug therapy: Secondary | ICD-10-CM | POA: Diagnosis not present

## 2015-02-04 DIAGNOSIS — R1012 Left upper quadrant pain: Secondary | ICD-10-CM | POA: Diagnosis not present

## 2015-02-04 DIAGNOSIS — F329 Major depressive disorder, single episode, unspecified: Secondary | ICD-10-CM | POA: Insufficient documentation

## 2015-02-04 DIAGNOSIS — Z8679 Personal history of other diseases of the circulatory system: Secondary | ICD-10-CM | POA: Diagnosis not present

## 2015-02-04 DIAGNOSIS — Z8742 Personal history of other diseases of the female genital tract: Secondary | ICD-10-CM | POA: Insufficient documentation

## 2015-02-04 DIAGNOSIS — R112 Nausea with vomiting, unspecified: Secondary | ICD-10-CM | POA: Diagnosis present

## 2015-02-04 DIAGNOSIS — Z8711 Personal history of peptic ulcer disease: Secondary | ICD-10-CM | POA: Insufficient documentation

## 2015-02-04 DIAGNOSIS — R109 Unspecified abdominal pain: Secondary | ICD-10-CM

## 2015-02-04 LAB — CBC WITH DIFFERENTIAL/PLATELET
BASOS ABS: 0 10*3/uL (ref 0.0–0.1)
Basophils Relative: 1 % (ref 0–1)
Eosinophils Absolute: 0.2 10*3/uL (ref 0.0–0.7)
Eosinophils Relative: 3 % (ref 0–5)
HCT: 36.5 % (ref 36.0–46.0)
HEMOGLOBIN: 12.4 g/dL (ref 12.0–15.0)
LYMPHS ABS: 1.4 10*3/uL (ref 0.7–4.0)
LYMPHS PCT: 17 % (ref 12–46)
MCH: 29.4 pg (ref 26.0–34.0)
MCHC: 34 g/dL (ref 30.0–36.0)
MCV: 86.5 fL (ref 78.0–100.0)
Monocytes Absolute: 0.3 10*3/uL (ref 0.1–1.0)
Monocytes Relative: 4 % (ref 3–12)
NEUTROS PCT: 75 % (ref 43–77)
Neutro Abs: 6.4 10*3/uL (ref 1.7–7.7)
PLATELETS: 235 10*3/uL (ref 150–400)
RBC: 4.22 MIL/uL (ref 3.87–5.11)
RDW: 14 % (ref 11.5–15.5)
WBC: 8.4 10*3/uL (ref 4.0–10.5)

## 2015-02-04 LAB — COMPREHENSIVE METABOLIC PANEL
ALBUMIN: 3.8 g/dL (ref 3.5–5.2)
ALT: 16 U/L (ref 0–35)
AST: 18 U/L (ref 0–37)
Alkaline Phosphatase: 123 U/L — ABNORMAL HIGH (ref 39–117)
Anion gap: 8 (ref 5–15)
BUN: 15 mg/dL (ref 6–23)
CALCIUM: 9.2 mg/dL (ref 8.4–10.5)
CO2: 25 mmol/L (ref 19–32)
CREATININE: 0.72 mg/dL (ref 0.50–1.10)
Chloride: 104 mmol/L (ref 96–112)
GFR calc Af Amer: >90 mL/min (ref >90)
GFR calc non Af Amer: >90 mL/min (ref >90)
Glucose, Bld: 96 mg/dL (ref 70–99)
Potassium: 4 mmol/L (ref 3.5–5.1)
Sodium: 137 mmol/L (ref 135–145)
TOTAL PROTEIN: 7.2 g/dL (ref 6.0–8.3)
Total Bilirubin: 0.4 mg/dL (ref 0.3–1.2)

## 2015-02-04 LAB — URINALYSIS, ROUTINE W REFLEX MICROSCOPIC
BILIRUBIN URINE: NEGATIVE
GLUCOSE, UA: NEGATIVE mg/dL
Ketones, ur: NEGATIVE mg/dL
Leukocytes, UA: NEGATIVE
NITRITE: NEGATIVE
PH: 5.5 (ref 5.0–8.0)
Protein, ur: NEGATIVE mg/dL
Specific Gravity, Urine: 1.013 (ref 1.005–1.030)
Urobilinogen, UA: 0.2 mg/dL (ref 0.0–1.0)

## 2015-02-04 LAB — LIPASE, BLOOD: Lipase: 44 U/L (ref 11–59)

## 2015-02-04 LAB — URINE MICROSCOPIC-ADD ON

## 2015-02-04 MED ORDER — SODIUM CHLORIDE 0.9 % IV BOLUS (SEPSIS)
1000.0000 mL | Freq: Once | INTRAVENOUS | Status: AC
  Administered 2015-02-04: 1000 mL via INTRAVENOUS

## 2015-02-04 MED ORDER — SODIUM CHLORIDE 0.9 % IV SOLN
80.0000 mg | Freq: Once | INTRAVENOUS | Status: AC
  Administered 2015-02-04: 80 mg via INTRAVENOUS
  Filled 2015-02-04: qty 80

## 2015-02-04 MED ORDER — ONDANSETRON HCL 4 MG/2ML IJ SOLN
4.0000 mg | Freq: Once | INTRAMUSCULAR | Status: AC
  Administered 2015-02-04: 4 mg via INTRAVENOUS
  Filled 2015-02-04: qty 2

## 2015-02-04 MED ORDER — IOHEXOL 300 MG/ML  SOLN
100.0000 mL | Freq: Once | INTRAMUSCULAR | Status: AC | PRN
  Administered 2015-02-04: 100 mL via INTRAVENOUS

## 2015-02-04 MED ORDER — MORPHINE SULFATE 4 MG/ML IJ SOLN
4.0000 mg | Freq: Once | INTRAMUSCULAR | Status: AC
  Administered 2015-02-04: 4 mg via INTRAVENOUS
  Filled 2015-02-04: qty 1

## 2015-02-04 NOTE — ED Notes (Signed)
Pt presents with c/o vomiting for approx 2-3 weeks. Pt was seen at her PCP but symptoms have not resolved. Pt denies diarrhea.

## 2015-02-04 NOTE — ED Notes (Signed)
Patient unable to urinate at this time. 

## 2015-02-04 NOTE — ED Provider Notes (Signed)
CSN: [id number]     Arrival date & time 02/04/15  1900 History   First MD Initiated Contact with Patient 02/04/15 2121     Chief Complaint  Patient presents with  . Emesis     (Consider location/radiation/quality/duration/timing/severity/associated sxs/prior Treatment) The history is provided by the patient and medical records. No language interpreter was used.     Nicole Melendez is a 39 y.o. female  with a hx of hx of diabetes (no longer on medication after losing 123 lbs), PUD, anxiety, depression presents to the Emergency Department complaining of gradual, persistent, progressively worsening vomiting onset 2-3 weeks ago.  Pt reports she was evaluated by her PCP who gave her bactrim which did not help her emesis.  She reports this was given for "irritation" of her sinuses.  Pt reports she had no abd pain prior to the onset of her emesis.  She reports she has abd soreness now but no focal pain.  Pt reports Hx of hiatal hernia which was surgically repaired 2 years ago.  Pt reports no hematemesis, melena or hematochezia.  Pt reports she vomits about several times per day but not immediately before or after eating.  Nothing makes it better or worse.  Pt denies fever, chills, headache, neck pain, chest pain, SOB, diarrhea, weakness, dizziness, syncope.  Pt reports she does take aleve (once every 2 weeks or so) but denies EtOH usage.     Past Medical History  Diagnosis Date  . Morbid obesity   . Peptic ulcer disease   . GERD (gastroesophageal reflux disease)   . Migraine   . Anxiety   . Anemia   . Hyperlipidemia   . Menorrhagia   . PNA (pneumonia) july 2011  . Diabetes mellitus type II     steroid related  . Depression   . Sarcoid     including hand per rheumatology-Dr. Amil Amen  . Allergic rhinitis, cause unspecified   . Varicose veins with pain   . Positive ANA (antinuclear antibody) 02/14/2012  . Shortness of breath     on exertion  . History of blood transfusion   . Sarcoidosis  of lung    Past Surgical History  Procedure Laterality Date  . Uterine ablation  03/2010  . Wisdom tooth extraction    . Abdominal hysterectomy     Family History  Problem Relation Age of Onset  . Diabetes    . Hypertension    . Allergies Mother   . Heart attack Mother   . Heart disease Father   . Rheum arthritis Father   . Ovarian cancer Maternal Aunt   . Lung cancer Maternal Aunt   . Breast cancer Maternal Aunt   . Bone cancer Maternal Aunt   . Other Neg Hx    History  Substance Use Topics  . Smoking status: Former Smoker -- 1.00 packs/day for 20 years    Types: Cigarettes    Quit date: 10/25/2012  . Smokeless tobacco: Not on file     Comment: QUIT 04/2010 AND STARTED BACK 2014 X 3 MONTHS. less than 1 ppd.  started at age 42.    Marland Kitchen Alcohol Use: No   OB History    No data available     Review of Systems  Constitutional: Negative for fever, diaphoresis, appetite change, fatigue and unexpected weight change.  HENT: Negative for mouth sores and trouble swallowing.   Respiratory: Negative for cough, chest tightness, shortness of breath, wheezing and stridor.   Cardiovascular: Negative for  chest pain and palpitations.  Gastrointestinal: Positive for nausea, vomiting and abdominal pain. Negative for diarrhea, constipation, blood in stool, abdominal distention and rectal pain.  Genitourinary: Negative for dysuria, urgency, frequency, hematuria, flank pain and difficulty urinating.  Musculoskeletal: Negative for back pain, neck pain and neck stiffness.  Skin: Negative for rash.  Neurological: Negative for weakness.  Hematological: Negative for adenopathy.  Psychiatric/Behavioral: Negative for confusion.  All other systems reviewed and are negative.     Allergies  Azithromycin; Oxycodone-acetaminophen; Penicillins; and Klonopin  Home Medications   Prior to Admission medications   Medication Sig Start Date End Date Taking? Authorizing Provider  acetaminophen (TYLENOL)  500 MG tablet Take 1,000 mg by mouth every 6 (six) hours as needed. For pain   Yes Historical Provider, MD  ALPRAZolam Duanne Moron) 0.5 MG tablet TAKE 1 TABLET BY MOUTH THREE TIMES DAILY AS NEEDED 01/23/15  Yes Biagio Borg, MD  budesonide-formoterol [facility]) 160-4.5 MCG/ACT inhaler Inhale 2 puffs into the lungs 2 (two) times daily. 10/17/14  Yes Biagio Borg, MD  cetirizine (ZYRTEC) 10 MG tablet Take 1 tablet (10 mg total) by mouth daily. 02/15/14  Yes Biagio Borg, MD  cholecalciferol (VITAMIN D) 1000 UNITS tablet Take 5,000 Units by mouth daily.   Yes Historical Provider, MD  cyclobenzaprine (FLEXERIL) 5 MG tablet Take 1 tablet (5 mg total) by mouth at bedtime as needed for muscle spasms. 10/17/14  Yes Biagio Borg, MD  fluticasone (FLONASE) 50 MCG/ACT nasal spray Place 2 sprays into both nostrils daily as needed. For allergies 10/17/14  Yes Biagio Borg, MD  insulin lispro (HUMALOG) 100 UNIT/ML injection Inject 0.12 mLs (12 Units total) into the skin 3 (three) times daily with meals. Up to 12 units tid with meals, sliding scale use as directed 11/22/14  Yes Biagio Borg, MD  levalbuterol [facility] HFA) 45 MCG/ACT inhaler Inhale 1-2 puffs into the lungs 2 (two) times daily. 10/17/14  Yes Biagio Borg, MD  naproxen (NAPROSYN) 500 MG tablet Take 1 tablet (500 mg total) by mouth 2 (two) times daily with a meal. 10/17/14  Yes Biagio Borg, MD  naproxen sodium (ANAPROX) 220 MG tablet Take 220 mg by mouth 2 (two) times daily with a meal.   Yes Historical Provider, MD  predniSONE (DELTASONE) 10 MG tablet Take 0.5 tablets (5 mg total) by mouth daily. 01/10/15  Yes Kathee Delton, MD  sertraline (ZOLOFT) 100 MG tablet 2 tabs by mouth per day Patient taking differently: Take 200 mg by mouth daily.  10/17/14  Yes Biagio Borg, MD  vitamin B-12 (CYANOCOBALAMIN) 1000 MCG tablet Take 1,000 mcg by mouth daily.   Yes Historical Provider, MD  B-D UF III MINI PEN NEEDLES 31G X 5 MM MISC USE FOUR TIMES DAILY FOR SLIDING  SCALE AS NEEDED    Biagio Borg, MD  HYDROcodone-acetaminophen (NORCO/VICODIN) 5-325 MG per tablet Take 1-2 tablets by mouth every 6 (six) hours as needed for moderate pain or severe pain. 02/05/15   Tyquasia Pant, PA-C  NOVOLOG FLEXPEN 100 UNIT/ML FlexPen USE AS NEEDED AS DIRECTED Patient not taking: Reported on 02/04/2015 05/03/14   Biagio Borg, MD  omeprazole (PRILOSEC) 20 MG capsule Take 1 capsule (20 mg total) by mouth daily. 02/05/15   Pike Scantlebury, PA-C  ondansetron (ZOFRAN ODT) 8 MG disintegrating tablet 8mg  ODT q4 hours prn nausea 02/05/15   Juanmiguel Defelice, PA-C  sulfamethoxazole-trimethoprim (BACTRIM DS,SEPTRA DS) 800-160 MG per tablet Take 1 tablet by mouth  2 (two) times daily. Patient not taking: Reported on 02/04/2015 01/22/15   Hendricks Limes, MD   BP 134/76 mmHg  Pulse 83  Temp(Src)   Resp 22  Ht 5\' 7"  (1.702 m)  Wt 312 lb (141.522 kg)  BMI 48.85 kg/m2  SpO2 100%  LMP 07/17/2012 Physical Exam  Constitutional: She appears well-developed and well-nourished.  HENT:  Head: Normocephalic and atraumatic.  Mouth/Throat: Oropharynx is clear and moist.  Eyes: Conjunctivae are normal. No scleral icterus.  Cardiovascular: Normal rate, regular rhythm and intact distal pulses.   Pulmonary/Chest: Effort normal and breath sounds normal.  Abdominal: Soft. Bowel sounds are normal. She exhibits no distension and no mass. There is tenderness in the left upper quadrant. There is no rebound, no guarding and no CVA tenderness.    Left upper quadrant abdominal tenderness without guarding or rebound No CVA tenderness  Neurological: She is alert.  Skin: Skin is warm and dry.  Psychiatric: She has a normal mood and affect.  Nursing note and vitals reviewed.   ED Course  Procedures (including critical care time) Labs Review Labs Reviewed  COMPREHENSIVE METABOLIC PANEL - Abnormal; Notable for the following:    Alkaline Phosphatase 123 (*)    All other components within  normal limits  URINALYSIS, ROUTINE W REFLEX MICROSCOPIC - Abnormal; Notable for the following:    Hgb urine dipstick TRACE (*)    All other components within normal limits  URINE MICROSCOPIC-ADD ON - Abnormal; Notable for the following:    Bacteria, UA FEW (*)    All other components within normal limits  CBC WITH DIFFERENTIAL/PLATELET  LIPASE, BLOOD    Imaging Review No results found.   EKG Interpretation   Date/Time:  Tuesday February 04 2015 21:28:37 EDT Ventricular Rate:  82 PR Interval:  172 QRS Duration: 94 QT Interval:  389 QTC Calculation: 315 R Axis:   94 Text Interpretation:  Sinus rhythm Borderline right axis deviation ST  elev, probable normal early repol pattern ED PHYSICIAN INTERPRETATION  AVAILABLE IN [place] Confirmed by TEST, Record ([id number]) on  02/06/2015 8:10:38 AM      MDM   Final diagnoses:  Abdominal pain  Non-intractable vomiting with nausea, vomiting of unspecified type   [name] presents with left upper quadrant abdominal pain and daily emesis for the last 3 weeks. Patient reports being treated for a possible sinus infection with Bactrim and has not been given antiemetics by her primary care physician.  Patient reports she is concerned because of her persistent vomiting.    Patient has a history of hiatal hernia repair by [place] [facility] with a procedure similar to gastric bypass.  1:00AM Labs reassuring.  CT scan pending.  Pt resting comfortably at this time.    Care transferred to Florene Glen, NP who will follow CT and dispo accordingly.  I anticipate discharge home with GI follow-up.  BP 134/76 mmHg  Pulse 83  Temp(Src)   Resp 22  Ht 5\' 7"  (1.702 m)  Wt 312 lb (141.522 kg)  BMI 48.85 kg/m2  SpO2 100%  LMP 07/17/2012   Abigail Butts, PA-C 02/07/15 1746  Davonna Belling, MD 02/08/15 1254

## 2015-02-05 MED ORDER — HYDROCODONE-ACETAMINOPHEN 5-325 MG PO TABS: 1.0000 | ORAL_TABLET | Freq: Four times a day (QID) | ORAL | Status: DC | PRN

## 2015-02-05 MED ORDER — ONDANSETRON 8 MG PO TBDP
ORAL_TABLET | ORAL | Status: DC
Start: 2015-02-05 — End: 2015-02-13

## 2015-02-05 MED ORDER — OMEPRAZOLE 20 MG PO CPDR: 20.0000 mg | DELAYED_RELEASE_CAPSULE | Freq: Every day | ORAL | Status: DC

## 2015-02-05 MED ORDER — ONDANSETRON HCL 4 MG/2ML IJ SOLN
INTRAMUSCULAR | Status: AC
  Filled 2015-02-05: qty 2

## 2015-02-05 NOTE — Discharge Instructions (Signed)
1. Medications: zofran, vicodin, usual home medications 2. Treatment: rest, drink plenty of fluids, advance diet slowly 3. Follow Up: Please followup with your primary doctor in 2 days for discussion of your diagnoses and further evaluation after today's visit; if you do not have a primary care doctor use the resource guide provided to find one; Please return to the ER for persistent vomiting, high fevers or worsening symptoms  

## 2015-02-06 ENCOUNTER — Ambulatory Visit: Payer: Self-pay | Admitting: Pulmonary Disease

## 2015-02-12 ENCOUNTER — Other Ambulatory Visit: Payer: Self-pay | Admitting: Internal Medicine

## 2015-02-13 ENCOUNTER — Ambulatory Visit (INDEPENDENT_AMBULATORY_CARE_PROVIDER_SITE_OTHER)
Admission: RE | Admit: 2015-02-13 | Discharge: 2015-02-13 | Disposition: A | Discharge status: Home / Self Care | Payer: BLUE CROSS/BLUE SHIELD | Source: Ambulatory Visit | Attending: Pulmonary Disease | Admitting: Pulmonary Disease

## 2015-02-13 ENCOUNTER — Ambulatory Visit (INDEPENDENT_AMBULATORY_CARE_PROVIDER_SITE_OTHER): Payer: BLUE CROSS/BLUE SHIELD | Admitting: Pulmonary Disease

## 2015-02-13 ENCOUNTER — Telehealth: Payer: Self-pay | Admitting: Pulmonary Disease

## 2015-02-13 ENCOUNTER — Other Ambulatory Visit: Payer: Self-pay | Admitting: *Deleted

## 2015-02-13 ENCOUNTER — Encounter: Payer: Self-pay | Admitting: Pulmonary Disease

## 2015-02-13 VITALS — BP 124/70 | HR 76 | Temp 98.2°F | Ht 67.0 in | Wt 316.2 lb

## 2015-02-13 DIAGNOSIS — D869 Sarcoidosis, unspecified: Secondary | ICD-10-CM

## 2015-02-13 MED ORDER — PREDNISONE 10 MG PO TABS: 5.0000 mg | ORAL_TABLET | Freq: Every day | ORAL | Status: DC

## 2015-02-13 NOTE — Patient Instructions (Signed)
Continue on your methotrexate and very low dose prednisone.  Keep up with your rheumatology visits Work on weight loss Will check chest xray today, and schedule breathing studies for some time in next 4-8 weeks Try stopping your symbicort since you are no longer smoking, and your sarcoid appears to be under good control followup again in 56mos.

## 2015-02-13 NOTE — Progress Notes (Signed)
   Subjective:    Patient ID: Nicole Melendez, female    DOB: July 31, 1976, 39 y.o.   MRN: [id number]  HPI The patient comes in today for follow-up of her known sarcoidosis, manifested as arthritis and also dyspnea on exertion. She has done very well on methotrexate and low-dose prednisone, and is also following with rheumatology with her blood work. She comes in today where she feels that she is breathing fairly well, and has had no recent exacerbations. She has no significant cough or mucus production. She has gained over 30 pounds since the last visit, and I've encouraged her to work aggressively on reducing this. She is due for her yearly chest x-ray and PFTs.   Review of Systems  Constitutional: Negative for fever and unexpected weight change.  HENT: Positive for congestion, postnasal drip, sneezing and sore throat. Negative for dental problem, ear pain, nosebleeds, rhinorrhea, sinus pressure and trouble swallowing.   Eyes: Negative for redness and itching.  Respiratory: Negative for cough, chest tightness, shortness of breath and wheezing.   Cardiovascular: Negative for palpitations and leg swelling.  Gastrointestinal: Negative for nausea and vomiting.  Genitourinary: Negative for dysuria.  Musculoskeletal: Negative for joint swelling.  Skin: Negative for rash.  Neurological: Negative for headaches.  Hematological: Does not bruise/bleed easily.  Psychiatric/Behavioral: Negative for dysphoric mood. The patient is not nervous/anxious.        Objective:   Physical Exam Obese female in no acute distress Nose without purulence or discharge noted Neck without lymphadenopathy or thyromegaly Chest with clear breath sounds, no wheezing Cardiac exam with regular rate and rhythm Lower extremities without significant edema, no cyanosis Alert and oriented, moves all 4 extremities.       Assessment & Plan:

## 2015-02-13 NOTE — Telephone Encounter (Signed)
Patient notified of CXR results. Nothing further needed.  

## 2015-02-13 NOTE — Assessment & Plan Note (Signed)
The patient continues on methotrexate and low-dose prednisone for her sarcoidosis. Her joint issues have really been more of a problem than her breathing issues, and she continues to follow-up with rheumatology. She is due for her yearly chest x-ray and PFTs, and these will be scheduled. I've also asked her to try and come off her Symbicort, since she has quit smoking and her sarcoid appears to be under good control. Finally, I have stressed to her the importance of aggressive weight loss, since this can affect her breathing.

## 2015-02-14 NOTE — Consult Note (Signed)
PATIENT NAME:  Nicole Melendez, Nicole Melendez MR#:  [id number] DATE OF BIRTH:  12-08-1975  DATE OF CONSULTATION:  03/07/2013  REFERRING PHYSICIAN:    Arletta Bale, MD CONSULTING PHYSICIAN:  Lavone Orn, MD  CHIEF COMPLAINT: Diabetes.   HISTORY OF PRESENT ILLNESS: This is a 39 year old female seen in consultation for diabetes. She has morbid obesity and underwent sleeve gastrectomy yesterday. She has had diabetes since 2011. Diabetes was managed with Janumet 50/500 mg once daily and NovoLog before meals sliding scale. She recalls blood sugars were typically in the low 100 range and she is not seeing any blood sugars over 160 in recent weeks. She had a severe low episode about one week ago. She recalls loss of consciousness.  EMS was called and she was found to have blood sugar in the 50s. She was treated and released. Since that time she has not been taking her Janumet. She denies any known complications from diabetes. Blood sugars since hospitalization have been in the 130 to 170 range on fingerstick testing. A venous glucose level this morning was just 96. Currently her incisional pain is fairly well controlled. She denies nausea. She is on a liquid diet. Appetite is poor.   PAST MEDICAL HISTORY: 1.  Type 2 diabetes.  2.  Morbid obesity (BMI 55). 3.  Anxiety.  4.  Sarcoid.   FAMILY HISTORY: Both parents had diabetes.   ALLERGIES: 1.  PENICILLIN. 2.  AZITHROMYCIN. 3.  PERCOCET.  OUTPATIENT MEDICATIONS: 1.  Janumet 50/500 mg once daily.  2.  NovoLog insulin sliding scale before meals and at bedtime.  3.  Ambien 10 mg at bedtime.  4.  Methotrexate 10 mg once weekly.  5.  Xanax 0.5 mg t.i.d. as needed.  6.  Zoloft 50 mg daily.  7.  Celebrex 200 mg daily.  8.  Flexeril 5 mg t.i.d. as needed.  9.  Tramadol 50 mg t.i.d. as needed.  10.  Lasix 20 mg daily as needed.   SOCIAL HISTORY: The patient is single. She denies use of alcohol. She quit tobacco 3 years ago, she then recently restarted but quit  again 2 weeks ago.    REVIEW OF SYSTEMS:   GENERAL: Poor appetite. No fever.  HEENT: No blurred vision. No sore throat.  NECK: No neck pain. No dysphasia.  CARDIAC: No chest pain. No palpitations.  PULMONARY: No cough. No shortness of breath.  ABDOMEN: She has appropriate incisional pain. Denies nausea.  EXTREMITIES: Denies leg swelling. Denies weakness. endocrine:  Denies heat or cold intolerance.  HEMATOLOGIC:  Denies easy bruisability or recent bleeding.   PHYSICAL EXAMINATION: VITAL SIGNS: Height 66.9 inches, weight 353 pounds, BMI 55. Temperature 98.2, pulse 87, respirations 20, blood pressure 122/86.  GENERAL: Morbidly obese, African American female in no acute distress.  HEENT: Extraocular movements are intact. Oropharynx clear.  Mucous membranes moist. NECK: Supple. No thyromegaly is present. No submandibular lymphadenopathy is present.  CARDIAC: Regular rate and rhythm. No carotid bruit.  LUNGS: Clear to auscultation bilaterally. Good inspiratory effort. No wheeze.  ABDOMEN: Appropriately tender, decreased bowel sounds.  EXTREMITIES: No edema is present.  SKIN: No rash or dermatopathy is present. Acanthosis nigricans is present on skin folds.  PSYCHIATRIC: Alert and oriented x 3.  NEUROLOGIC: No tremors present. No appreciable sensory deficits. Normal cranial nerves.   LABORATORY DATA: Glucose 96, BUN 6, creatinine 0.76, sodium 137, potassium 4.1, calcium 9.0, WBC 9.4, hematocrit 33.3, platelets 295.   GENERAL: This is a 39 year old female with mild diabetes, apparently was  well controlled prior to surgery on low dose Janumet.   RECOMMENDATIONS: 1.  Agree with current use of NovoLog sliding scale only. Given that she will be on a very limited diet and previously had well-controlled diabetes just on Janumet, I do not suspect she will need any additional medications. She is familiar with use of NovoLog sliding scale, so this would be appropriate for discharge.  2.  I do  recommend follow-up with her primary care physician in 3 to 4 weeks to review diabetes control.  Her primary care physician is in [place]. She has a good long-term prognosis, especially with weight loss, that she can maintain good diabetes control with diet alone.   Thank you for the kind request for consultation.     ____________________________ A. Lavone Orn, MD ams:ct D: 03/07/2013 10:24:56 ET T: 03/07/2013 11:01:02 ET JOB#: [id number]  cc: A. Lavone Orn, MD, <Dictator> Sherlon Handing MD ELECTRONICALLY SIGNED 03/09/2013 15:48

## 2015-02-14 NOTE — Op Note (Signed)
PATIENT NAME:  Nicole Melendez, Nicole Melendez MR#:  [id number] DATE OF BIRTH:  1976/07/21  DATE OF PROCEDURE:  03/06/2013  PREOPERATIVE DIAGNOSIS: Morbid obesity with a BMI of 53, associated with diabetes mellitus, obstructive sleep apnea, hypertension and gastroesophageal reflux disease.   POSTOPERATIVE DIAGNOSIS: Morbid obesity with a BMI of 53, associated with diabetes mellitus, obstructive sleep apnea, hypertension and gastroesophageal reflux disease with presence of hiatal hernia.   PROCEDURE PERFORMED: Laparoscopic sleeve gastrectomy with repair of hiatal hernia.   PHYSICIAN IN ATTENDANCE:  Legrand Como A. Duke Salvia, MD   ASSISTANT: Darrin Luis, PA   DESCRIPTION OF PROCEDURE: The patient was brought to the operating room and placed in the supine position. General anesthesia was obtained with orotracheal intubation. The patient had a Foley catheter inserted sterilely. TED hose and Thromboguards were applied and a foot board applied at the end of the operative bed. The abdomen and chest were sterilely prepped.  A 5 mm Optiview trocar was introduced under direct visualization in the left upper quadrant of the abdomen. Pneumoperitoneum was obtained with carbon dioxide. Three additional trocars were introduced across the upper abdomen and a Nathanson liver retractor introduced through a subxiphoid wound. On inspection, the patient was noted to have a small hiatal hernia present with the GE junction lying within the lower mediastinum consistent with a 1 cm defect. The patient had division of gastrohepatic ligament with use of the Harmonic scalpel. Hemoclips were applied at the moderate sized left hepatic branch of the left gastric artery. The patient then had division of the peritoneum across the anterior hiatus. This was performed with use of the Harmonic scalpel followed by blunt dissection separating both the herniated peritoneum and the lower esophagus away from the overlying pericardium. Additional dissection within the  lower mediastinum was performed, sweeping the esophagus away from the pleural surfaces on the right and left side.  Use of the Harmonic scalpel was performed in case of several small little vessels to prevent any bleeding. The patient then had division of the peritoneum just lateral to the right crural margin, and further blunt dissection was used to reduce herniated lesser sac fatty tissue. The base of the crus was identified. The patient then had division of several phrenoesophageal ligaments and the upper stomach freed from the undersurface of the left hemidiaphragm, all done by use of the Harmonic scalpel. Further circumferential dissection of the lower esophagus was then performed completing mobilization from the pleural surfaces on the right and left side. This dissection was extended in the lower mediastinum over a distance of approximately 5 to 6 cm. Ultimately, this resulted in delivery of 2 cm of esophagus lying comfortably in the abdominal cavity. A posterior crural repair was then performed with 2 interrupted 0 Ethibond sutures. At this point, the patient had division of multiple gastric pedicles along the greater curvature of the stomach beginning approximately 4 cm proximal to the pylorus, the stomach being freed from peritoneal attachments to the pancreas as well. With full mobilization of the lateral aspect of the stomach, a ViSiGi dilator and aspirating cannula was introduced transorally and directed towards the distal antrum. The patient then underwent a series of GIA staple firings to create a medially-based gastric tube. This was begun with a series of green load staples placed in a relative transverse direction in an effort to avoid any narrowing in the region of the incisura. Next, a vertical line of staples were placed parallel to the lesser curvature and brought out just lateral to the angle of His  leaving a small dog ear at the upper apex of the sleeve. This was then treated by partial  withdrawal, occlusion of the distal stomach, and insufflation via the cannula for distention of the gastric tube with a saline bath, no air leak identified. The ViSiGi dilator was withdrawn. The patient then had approximation in the upper outer aspect of the sleeve to the prior divided aspects of the gastrosplenic ligament.  This was done in an effort to prevent any proximal migration of the stomach into the lower mediastinum. At this point, the mobilized and divided lateral stomach was delivered through a right upper quadrant 15 mm trocar site. The fascia and peritoneum of the wound were then closed with 0 Vicryl suture as passed by a PMI suture device. The pneumoperitoneum was relieved. The trocars were removed. The wounds were injected with 0.25 Marcaine followed by 4-0 Monocryl in the dermis, followed by Dermabond. The patient was allowed to recover at this point having tolerated the procedure well.  ____________________________ Venia Carbon. Duke Salvia, MD mat:cb D: 03/06/2013 16:00:57 ET T: 03/06/2013 17:28:00 ET JOB#: [id number]  cc: Legrand Como A. Duke Salvia, MD, <Dictator> Cathlean Cower, MD Ladora Daniel MD ELECTRONICALLY SIGNED 04/02/2013 13:44

## 2015-02-14 NOTE — Consult Note (Signed)
Chief Complaint and History:  Referring Physician Dr. Duke Salvia   Chief Complaint Diabetes   Allergies:  Fish: Resp. Distress, Angioedema  Shellfish: Other, Angioedema, Hives  Bee Stings: Swelling, Resp. Distress  Penicillin: Hives  Percocet 10/325: Hives  Assessment/Plan:  Assessment/Plan 39 yo F with type 2 diabetes and morbid obesity, s/p sleeve gastrectomy yesterday. She was seen, examined, and chart was reviewed. She was diangosed with type 2 diabetes in 2011. Outpt diabetes regimen included Janumet 50-500 mg once daily and NovoLog qAC sliding scale. She had a severe episode with LOC one week ago and at that time Janumet was stopped. Recalls that sugars are typically in the low 100s and never over 160. Now feeling well. Incision pain is controlled. No N/V. Sugar today fasting was in the 90s.  A/ Obesity S/p sleeve gastrectomy Type 2 diabetes  P/ Agree with current use of NovoLog SSI only. She has a good prognosis for not needing medications for diabetes as she was only on low dose Janumet with apparently good glycemic control pre-operatively. She was advised to see her PCP in [place] in the next 3-4 weeks for F/U diabetes.   Full consult will be dictated.   Electronic Signatures: Judi Cong (MD)  (Signed 14-May-14 10:08)  Authored: Chief Complaint and History, ALLERGIES, Assessment/Plan   Last Updated: 14-May-14 10:08 by Judi Cong (MD)

## 2015-02-21 ENCOUNTER — Ambulatory Visit (INDEPENDENT_AMBULATORY_CARE_PROVIDER_SITE_OTHER): Payer: BLUE CROSS/BLUE SHIELD | Admitting: Internal Medicine

## 2015-02-21 ENCOUNTER — Encounter: Payer: Self-pay | Admitting: Internal Medicine

## 2015-02-21 VITALS — BP 128/88 | HR 87 | Temp 98.9°F | Resp 18 | Ht 67.0 in | Wt 315.0 lb

## 2015-02-21 DIAGNOSIS — J069 Acute upper respiratory infection, unspecified: Secondary | ICD-10-CM

## 2015-02-21 DIAGNOSIS — J309 Allergic rhinitis, unspecified: Secondary | ICD-10-CM | POA: Diagnosis not present

## 2015-02-21 DIAGNOSIS — E119 Type 2 diabetes mellitus without complications: Secondary | ICD-10-CM

## 2015-02-21 MED ORDER — LEVOFLOXACIN 500 MG PO TABS: 500.0000 mg | ORAL_TABLET | Freq: Every day | ORAL | Status: DC

## 2015-02-21 MED ORDER — FLUCONAZOLE 150 MG PO TABS: ORAL_TABLET | ORAL | Status: DC

## 2015-02-21 MED ORDER — METHYLPREDNISOLONE ACETATE 80 MG/ML IJ SUSP
80.0000 mg | Freq: Once | INTRAMUSCULAR | Status: AC
  Administered 2015-02-21: 80 mg via INTRAMUSCULAR

## 2015-02-21 NOTE — Patient Instructions (Signed)
You had the steroid shot today  Please take all new medication as prescribed - the antibiotic (and diflucan if needed)  Please continue all other medications as before, and refills have been done if requested.  Please have the pharmacy call with any other refills you may need.  Please continue your efforts at being more active, low cholesterol diet, and weight control.  Please keep your appointments with your specialists as you may have planned

## 2015-02-21 NOTE — Assessment & Plan Note (Signed)
Mild to mod, for antibx course,  to f/u any worsening symptoms or concerns 

## 2015-02-21 NOTE — Progress Notes (Signed)
Subjective:    Patient ID: Nicole Melendez, female    DOB: Jul 23, 1976, 39 y.o.   MRN: [id number]  HPI   Here with 2-3 days acute onset fever, facial pain, pressure, headache, general weakness and malaise, and greenish d/c, with mild ST and cough, but pt denies chest pain, wheezing, increased sob or doe, orthopnea, PND, increased LE swelling, palpitations, dizziness or syncope.  Does have several wks ongoing nasal allergy symptoms with clearish congestion, itch and sneezing, without fever, pain, ST, cough, swelling or wheezing.   Pt denies polydipsia, polyuria Past Medical History  Diagnosis Date  . Morbid obesity   . Peptic ulcer disease   . GERD (gastroesophageal reflux disease)   . Migraine   . Anxiety   . Anemia   . Hyperlipidemia   . Menorrhagia   . PNA (pneumonia) july 2011  . Diabetes mellitus type II     steroid related  . Depression   . Sarcoid     including hand per rheumatology-Dr. Amil Amen  . Allergic rhinitis, cause unspecified   . Varicose veins with pain   . Positive ANA (antinuclear antibody) 02/14/2012  . Shortness of breath     on exertion  . History of blood transfusion   . Sarcoidosis of lung    Past Surgical History  Procedure Laterality Date  . Uterine ablation  03/2010  . Wisdom tooth extraction    . Abdominal hysterectomy      reports that she quit smoking about 2 years ago. Her smoking use included Cigarettes. She has a 20 pack-year smoking history. She does not have any smokeless tobacco history on file. She reports that she does not drink alcohol or use illicit drugs. family history includes Allergies in her mother; Bone cancer in her maternal aunt; Breast cancer in her maternal aunt; Diabetes in an other family member; Heart attack in her mother; Heart disease in her father; Hypertension in an other family member; Lung cancer in her maternal aunt; Ovarian cancer in her maternal aunt; Rheum arthritis in her father. There is no history of Other. Allergies    Allergen Reactions  . Azithromycin     (z pak) hives  . Oxycodone-Acetaminophen     REACTION: difficulty breathing  . Penicillins     REACTION: swelling and difficulty breathing  . Klonopin [Clonazepam] Other (See Comments)    Memory difficulty   Current Outpatient Prescriptions on File Prior to Visit  Medication Sig Dispense Refill  . acetaminophen (TYLENOL) 500 MG tablet Take 1,000 mg by mouth every 6 (six) hours as needed. For pain    . ALPRAZolam (XANAX) 0.5 MG tablet TAKE 1 TABLET BY MOUTH THREE TIMES DAILY AS NEEDED 90 tablet 2  . B-D UF III MINI PEN NEEDLES 31G X 5 MM MISC USE FOUR TIMES DAILY FOR SLIDING SCALE AS NEEDED 200 each 0  . budesonide-formoterol (SYMBICORT) 160-4.5 MCG/ACT inhaler Inhale 2 puffs into the lungs 2 (two) times daily. 1 Inhaler 11  . cetirizine (ZYRTEC) 10 MG tablet Take 1 tablet (10 mg total) by mouth daily. 30 tablet 11  . cholecalciferol (VITAMIN D) 1000 UNITS tablet Take 5,000 Units by mouth daily.    . cyclobenzaprine (FLEXERIL) 5 MG tablet Take 1 tablet (5 mg total) by mouth at bedtime as needed for muscle spasms. 30 tablet 3  . fluticasone (FLONASE) 50 MCG/ACT nasal spray Place 2 sprays into both nostrils daily as needed. For allergies 16 g 11  . insulin lispro (HUMALOG) 100 UNIT/ML injection  Inject 0.12 mLs (12 Units total) into the skin 3 (three) times daily with meals. Up to 12 units tid with meals, sliding scale use as directed 10 mL 11  . levalbuterol (XOPENEX HFA) 45 MCG/ACT inhaler Inhale 1-2 puffs into the lungs 2 (two) times daily. 15 g 11  . methotrexate (RHEUMATREX) 2.5 MG tablet Take as directed    . naproxen (NAPROSYN) 500 MG tablet TAKE 1 TABLET BY MOUTH TWICE DAILY WITH A MEAL 60 tablet 5  . naproxen sodium (ANAPROX) 220 MG tablet Take 220 mg by mouth 2 (two) times daily with a meal.    . omeprazole (PRILOSEC) 20 MG capsule Take 1 capsule (20 mg total) by mouth daily. 30 capsule 0  . predniSONE (DELTASONE) 10 MG tablet Take 0.5 tablets  (5 mg total) by mouth daily. 30 tablet 5  . sertraline (ZOLOFT) 100 MG tablet 2 tabs by mouth per day (Patient taking differently: Take 200 mg by mouth daily. ) 180 tablet 3  . vitamin B-12 (CYANOCOBALAMIN) 1000 MCG tablet Take 1,000 mcg by mouth daily.     No current facility-administered medications on file prior to visit.   Review of Systems  Constitutional: Negative for unusual diaphoresis or night sweats HENT: Negative for ringing in ear or discharge Eyes: Negative for double vision or worsening visual disturbance.  Respiratory: Negative for choking and stridor.   Gastrointestinal: Negative for vomiting or other signifcant bowel change Genitourinary: Negative for hematuria or change in urine volume.  Musculoskeletal: Negative for other MSK pain or swelling Skin: Negative for color change and worsening wound.  Neurological: Negative for tremors and numbness other than noted  Psychiatric/Behavioral: Negative for decreased concentration or agitation other than above       Objective:   Physical Exam BP 128/88 mmHg  Pulse 87  Temp(Src) 98.9 F (37.2 C) (Oral)  Resp 18  Ht 5\' 7"  (1.702 m)  Wt 315 lb (142.883 kg)  BMI 49.32 kg/m2  SpO2 97%  LMP 07/17/2012 VS noted, midl ill Constitutional: Pt appears in no significant distress HENT: Head: NCAT.  Right Ear: External ear normal.  Left Ear: External ear normal.  Bilat tm's with mild erythema.  Max sinus areas mild tender.  Pharynx with mild erythema, no exudate Eyes: . Pupils are equal, round, and reactive to light. Conjunctivae and EOM are normal Neck: Normal range of motion. Neck supple.  Cardiovascular: Normal rate and regular rhythm.   Pulmonary/Chest: Effort normal and breath sounds without rales or wheezing.  Neurological: Pt is alert. Not confused , motor grossly intact Skin: Skin is warm. No rash, no LE edema Psychiatric: Pt behavior is normal. No agitation.     Assessment & Plan:

## 2015-02-21 NOTE — Assessment & Plan Note (Signed)
stable overall by history and exam, recent data reviewed with pt, and pt to continue medical treatment as before,  to f/u any worsening symptoms or concerns Lab Results  Component Value Date   HGBA1C 5.8 01/22/2015

## 2015-02-21 NOTE — Assessment & Plan Note (Signed)
Mild to mod seasonal flare, for depomedrol IM, cont flonase asd,  to f/u any worsening symptoms or concerns

## 2015-03-18 ENCOUNTER — Other Ambulatory Visit: Payer: Self-pay | Admitting: Internal Medicine

## 2015-03-21 ENCOUNTER — Ambulatory Visit (INDEPENDENT_AMBULATORY_CARE_PROVIDER_SITE_OTHER): Payer: BLUE CROSS/BLUE SHIELD | Admitting: Internal Medicine

## 2015-03-21 ENCOUNTER — Encounter: Payer: Self-pay | Admitting: Internal Medicine

## 2015-03-21 ENCOUNTER — Other Ambulatory Visit (INDEPENDENT_AMBULATORY_CARE_PROVIDER_SITE_OTHER): Payer: BLUE CROSS/BLUE SHIELD

## 2015-03-21 VITALS — BP 132/74 | HR 85 | Temp 98.9°F | Resp 15 | Wt 321.1 lb

## 2015-03-21 DIAGNOSIS — R300 Dysuria: Secondary | ICD-10-CM

## 2015-03-21 DIAGNOSIS — K148 Other diseases of tongue: Secondary | ICD-10-CM | POA: Diagnosis not present

## 2015-03-21 DIAGNOSIS — R350 Frequency of micturition: Secondary | ICD-10-CM | POA: Diagnosis not present

## 2015-03-21 LAB — URINALYSIS
BILIRUBIN URINE: NEGATIVE
KETONES UR: NEGATIVE
LEUKOCYTES UA: NEGATIVE
Nitrite: NEGATIVE
Specific Gravity, Urine: 1.02 (ref 1.000–1.030)
TOTAL PROTEIN, URINE-UPE24: NEGATIVE
Urine Glucose: NEGATIVE
Urobilinogen, UA: 0.2 (ref 0.0–1.0)
pH: 6.5 (ref 5.0–8.0)

## 2015-03-21 MED ORDER — SULFAMETHOXAZOLE-TRIMETHOPRIM 800-160 MG PO TABS: 1.0000 | ORAL_TABLET | Freq: Two times a day (BID) | ORAL | Status: DC

## 2015-03-21 NOTE — Patient Instructions (Addendum)
Drink as much nondairy fluids as possible. Avoid spicy foods or alcohol as  these may aggravate the bladder. Do not take decongestants. Avoid narcotics if possible.  Salt water gargled well  3X/day;do not swallow  Plain Mucinex (NOT D) for thick secretions ;force NON dairy fluids .   Nasal cleansing in the shower as discussed with lather of mild shampoo.After 10 seconds wash off lather while  exhaling through nostrils. Make sure that all residual soap is removed to prevent irritation.  Flonase OR Nasacort AQ 1 spray in each nostril twice a day as needed. Use the "crossover" technique into opposite nostril spraying toward opposite ear @ 45 degree angle, not straight up into nostril.  Plain Allegra (NOT D )  160 daily , Loratidine 10 mg , OR Zyrtec 10 mg @ bedtime  as needed for itchy eyes & sneezing.

## 2015-03-21 NOTE — Progress Notes (Signed)
Pre visit review using our clinic review tool, if applicable. No additional management support is needed unless otherwise documented below in the visit note. 

## 2015-03-21 NOTE — Progress Notes (Signed)
   Subjective:    Patient ID: Nicole Melendez, female    DOB: [birth year], 39 y.o.   MRN: [id number]  HPI  Her symptoms began as congestion in  the left ear. With a Q-tip she was able to remove some yellow material. That day she had some cold sweats. She developed sore throat for which she used Hall's lozenges.  As of 5/25 she thought she had thrush covering her tongue. She developed a painful white bump on the tip of the time. Also on that day she began having aching with urination as well as frequency.   She denies any other symptoms of an upper respiratory tract infection. She also denies any pulmonary symptoms.  She denies hematuria or pyuria.  Review of Systems  Frontal headache, facial pain , nasal purulence, dental pain, sore throat , otic pain or otic discharge denied. No fever or chills. Extrinsic symptoms of itchy, watery eyes, sneezing, or angioedema are denied. There is no significant cough, sputum production, wheezing,or  paroxysmal nocturnal dyspnea.       Objective:   Physical Exam  Pertinent or positive findings include: The otic exam is  totally normal with no sign of infection.  She has a small whitish plaque 3 x 3 mm over the tip of the tongue.  General appearance :adequately nourished; in no distress. BMI:50.28 Eyes: No conjunctival inflammation or scleral icterus is present. Oral exam:  Lips and gums are healthy appearing.There is no oropharyngeal erythema or exudate noted. Dental hygiene is good. Heart:  Normal rate and regular rhythm. S1 and S2 normal without gallop, murmur, click, rub or other extra sounds   Lungs:Chest clear to auscultation; no wheezes, rhonchi,rales ,or rubs present.No increased work of breathing.  Abdomen: bowel sounds normal, soft and non-tender without masses, organomegaly or hernias noted.  No guarding or rebound. No flank tenderness to percussion. Vascular : all pulses equal ; no bruits present. Skin:Warm & dry.  Intact without  suspicious lesions or rashes ; no tenting Lymphatic: No lymphadenopathy is noted about the head, neck, axilla Neuro: Strength, tone & DTRs normal.         Assessment & Plan:  #1 dysuria, probable urinary tract infection #2 URI symptoms #3 tongue lesion Plan:  she'll be placed on Sulfa pending results of the culture and sensitivity.

## 2015-03-23 LAB — URINE CULTURE

## 2015-04-03 ENCOUNTER — Ambulatory Visit: Payer: Self-pay | Admitting: Internal Medicine

## 2015-04-21 ENCOUNTER — Telehealth: Payer: Self-pay | Admitting: Internal Medicine

## 2015-04-22 ENCOUNTER — Ambulatory Visit: Payer: Self-pay | Admitting: Internal Medicine

## 2015-04-23 NOTE — Telephone Encounter (Signed)
Rx faxed to pharmacy  

## 2015-04-23 NOTE — Telephone Encounter (Signed)
Done hardcopy to Dahlia  

## 2015-04-25 NOTE — Telephone Encounter (Signed)
Rx called in to pharmacy. 

## 2015-04-25 NOTE — Telephone Encounter (Signed)
Patient called and stated pharmacy has not received this yet. Can you please refax it.

## 2015-05-14 ENCOUNTER — Ambulatory Visit: Payer: Self-pay | Admitting: Internal Medicine

## 2015-06-07 ENCOUNTER — Encounter (HOSPITAL_COMMUNITY): Payer: Self-pay | Admitting: Emergency Medicine

## 2015-06-07 ENCOUNTER — Emergency Department (HOSPITAL_COMMUNITY)
Admission: EM | Admit: 2015-06-07 | Discharge: 2015-06-07 | Disposition: A | Discharge status: Home / Self Care | Payer: BLUE CROSS/BLUE SHIELD | Source: Home / Self Care | Attending: Family Medicine | Admitting: Family Medicine

## 2015-06-07 DIAGNOSIS — R109 Unspecified abdominal pain: Secondary | ICD-10-CM

## 2015-06-07 DIAGNOSIS — K529 Noninfective gastroenteritis and colitis, unspecified: Secondary | ICD-10-CM | POA: Diagnosis not present

## 2015-06-07 MED ORDER — ONDANSETRON HCL 4 MG PO TABS: 4.0000 mg | ORAL_TABLET | Freq: Three times a day (TID) | ORAL | Status: DC | PRN

## 2015-06-07 NOTE — ED Provider Notes (Signed)
CSN: [id number]     Arrival date & time 06/07/15  1734 History   First MD Initiated Contact with Patient 06/07/15 1753     Chief Complaint  Patient presents with  . Nausea    Patient is a 39 y.o. female presenting with diarrhea, abdominal pain, and hypertension. The history is provided by the patient. No language interpreter was used.  Diarrhea Quality:  Watery (Diarrhea started 4 days ago) Severity:  Moderate Onset quality:  Gradual Timing:  Intermittent (She had loose stool 4 times already today) Progression:  Unchanged Relieved by:  Nothing Worsened by:  Nothing tried Associated symptoms: abdominal pain and vomiting   Associated symptoms: no recent cough and no fever   Vomiting:    Quality:  Stomach contents   Severity:  Moderate   Timing:  Intermittent   Progression:  Worsening (She vomited about 12 times already today. She has been drinking each time she vomits) Risk factors: sick contacts   Risk factors: no suspicious food intake   Abdominal Pain Pain location: Left mid abdominal quadrant. Pain quality: aching   Pain radiates to:  Does not radiate Pain severity:  Moderate Onset quality:  Unable to specify Timing:  Intermittent Context: retching   Context: not trauma   Relieved by: Uses tramadol at home with some improvement. Worsened by:  Nothing tried Associated symptoms: diarrhea, nausea and vomiting   Associated symptoms: no constipation, no fever, no hematemesis, no hematochezia and no melena   Hypertension This is a new (BP high today, this is not normal for her.) problem. Associated symptoms include abdominal pain.  Elevated BP: Denies previous hx of HTN.  Past Medical History  Diagnosis Date  . Morbid obesity   . Peptic ulcer disease   . GERD (gastroesophageal reflux disease)   . Migraine   . Anxiety   . Anemia   . Hyperlipidemia   . Menorrhagia   . PNA (pneumonia) july 2011  . Diabetes mellitus type II     steroid related  . Depression   . Sarcoid      including hand per rheumatology-Dr. Amil Amen  . Allergic rhinitis, cause unspecified   . Varicose veins with pain   . Positive ANA (antinuclear antibody) 02/14/2012  . Shortness of breath     on exertion  . History of blood transfusion   . Sarcoidosis of lung    Past Surgical History  Procedure Laterality Date  . Uterine ablation  03/2010  . Wisdom tooth extraction    . Abdominal hysterectomy     Family History  Problem Relation Age of Onset  . Diabetes    . Hypertension    . Allergies Mother   . Heart attack Mother   . Heart disease Father   . Rheum arthritis Father   . Ovarian cancer Maternal Aunt   . Lung cancer Maternal Aunt   . Breast cancer Maternal Aunt   . Bone cancer Maternal Aunt   . Other Neg Hx    Social History  Substance Use Topics  . Smoking status: Former Smoker -- 1.00 packs/day for 20 years    Types: Cigarettes    Quit date: 10/25/2012  . Smokeless tobacco: None     Comment: QUIT 04/2010 AND STARTED BACK 2014 X 3 MONTHS. less than 1 ppd.  started at age 12.    Marland Kitchen Alcohol Use: No   OB History    No data available     Review of Systems  Constitutional: Negative for fever.  Respiratory: Negative.   Cardiovascular: Negative.   Gastrointestinal: Positive for nausea, vomiting, abdominal pain and diarrhea. Negative for constipation, melena, hematochezia and hematemesis.  Genitourinary: Negative.   All other systems reviewed and are negative.   Allergies  Azithromycin; Oxycodone-acetaminophen; Penicillins; and Klonopin  Home Medications   Prior to Admission medications   Medication Sig Start Date End Date Taking? Authorizing Provider  acetaminophen (TYLENOL) 500 MG tablet Take 1,000 mg by mouth every 6 (six) hours as needed. For pain    Historical Provider, MD  ALPRAZolam Duanne Moron) 0.5 MG tablet TAKE 1 TABLET BY MOUTH THREE TIMES DAILY AS NEEDED 04/23/15   Biagio Borg, MD  B-D UF III MINI PEN NEEDLES 31G X 5 MM MISC USE FOUR TIMES DAILY FOR SLIDING  SCALE AS NEEDED    Biagio Borg, MD  budesonide-formoterol [facility]) 160-4.5 MCG/ACT inhaler Inhale 2 puffs into the lungs 2 (two) times daily. 10/17/14   Biagio Borg, MD  cetirizine (ZYRTEC) 10 MG tablet TAKE 1 TABLET BY MOUTH EVERY DAY 03/18/15   Biagio Borg, MD  cholecalciferol (VITAMIN D) 1000 UNITS tablet Take 5,000 Units by mouth daily.    Historical Provider, MD  cyclobenzaprine (FLEXERIL) 5 MG tablet Take 1 tablet (5 mg total) by mouth at bedtime as needed for muscle spasms. 10/17/14   Biagio Borg, MD  fluconazole (DIFLUCAN) 150 MG tablet 1 tab by mouth every 3 days as needed 02/21/15   Biagio Borg, MD  fluticasone [facility]) 50 MCG/ACT nasal spray Place 2 sprays into both nostrils daily as needed. For allergies 10/17/14   Biagio Borg, MD  insulin lispro (HUMALOG) 100 UNIT/ML injection Inject 0.12 mLs (12 Units total) into the skin 3 (three) times daily with meals. Up to 12 units tid with meals, sliding scale use as directed 11/22/14   Biagio Borg, MD  levalbuterol [facility] HFA) 45 MCG/ACT inhaler Inhale 1-2 puffs into the lungs 2 (two) times daily. 10/17/14   Biagio Borg, MD  levofloxacin (LEVAQUIN) 500 MG tablet Take 1 tablet (500 mg total) by mouth daily. 02/21/15   Biagio Borg, MD  methotrexate (RHEUMATREX) 2.5 MG tablet Take as directed    Historical Provider, MD  naproxen (NAPROSYN) 500 MG tablet TAKE 1 TABLET BY MOUTH TWICE DAILY WITH A MEAL 02/12/15   Biagio Borg, MD  naproxen sodium (ANAPROX) 220 MG tablet Take 220 mg by mouth 2 (two) times daily with a meal.    Historical Provider, MD  omeprazole (PRILOSEC) 20 MG capsule Take 1 capsule (20 mg total) by mouth daily. 02/05/15   Hannah Muthersbaugh, PA-C  predniSONE (DELTASONE) 10 MG tablet Take 0.5 tablets (5 mg total) by mouth daily. 02/13/15   Kathee Delton, MD  sertraline (ZOLOFT) 100 MG tablet 2 tabs by mouth per day Patient taking differently: Take 200 mg by mouth daily.  10/17/14   Biagio Borg, MD    sulfamethoxazole-trimethoprim (BACTRIM DS,SEPTRA DS) 800-160 MG per tablet Take 1 tablet by mouth 2 (two) times daily. 03/21/15   Hendricks Limes, MD  traMADol Veatrice Bourbon) 50 MG tablet  02/21/15   Historical Provider, MD  vitamin B-12 (CYANOCOBALAMIN) 1000 MCG tablet Take 1,000 mcg by mouth daily.    Historical Provider, MD   BP 149/102 mmHg  Pulse 85  Temp(Src) 98.3 F (36.8 C) (Oral)  Resp 16  SpO2 99%  LMP 07/17/2012 Physical Exam  Constitutional: She appears well-developed. No distress.  HENT:  Mouth/Throat: Uvula is midline,  oropharynx is clear and moist and mucous membranes are normal.  Cardiovascular: Normal rate, regular rhythm and normal heart sounds.   No murmur heard. Pulmonary/Chest: Effort normal and breath sounds normal. No respiratory distress. She has no wheezes.  Abdominal: Soft. She exhibits no distension and no mass. There is no hepatosplenomegaly. There is tenderness. There is no rigidity, no rebound, no guarding, no CVA tenderness, no tenderness at McBurney's point and negative Murphy's sign.    Musculoskeletal: Normal range of motion.  Skin: Skin is warm. No pallor.  Nursing note and vitals reviewed.   ED Course  Procedures (including critical care time) Labs Review Labs Reviewed - No data to display  Imaging Review No results found. Rechecked BP =140/95  MDM  No diagnosis found. Gastroenteritis Abdominal pain Elevated BP  Patient likely have viral enteritis especially in the setting of sick contact. Hydration recommended. Zofran given prn nausea and vomiting. Good hand and bowel hygiene discussed. Continue tramadol prn abdominal pain. She is advised to go to the ED if symptoms worsens. Currently she is hemodynamically stable. BP initially elevated. Repeat BP checked by me was 140/90. Elevated is likely due to her pain. Monitor for now.  Kinnie Feil, MD 06/07/15 [phone]

## 2015-06-07 NOTE — ED Notes (Signed)
C/o nausea and diarrhea

## 2015-06-07 NOTE — Discharge Instructions (Signed)
It was nice seeing you today, I am sorry you don't feel well, you likely have viral gastroenteritis. I have given you Zofran to help with your vomiting, keep hydrated. Continue Tramadol as needed for pain. If no improvement please go to the ED.   Viral Gastroenteritis Viral gastroenteritis is also called stomach flu. This illness is caused by a certain type of germ (virus). It can cause sudden watery poop (diarrhea) and throwing up (vomiting). This can cause you to lose body fluids (dehydration). This illness usually lasts for 3 to 8 days. It usually goes away on its own. HOME CARE   Drink enough fluids to keep your pee (urine) clear or pale yellow. Drink small amounts of fluids often.  Ask your doctor how to replace body fluid losses (rehydration).  Avoid:  Foods high in sugar.  Alcohol.  Bubbly (carbonated) drinks.  Tobacco.  Juice.  Caffeine drinks.  Very hot or cold fluids.  Fatty, greasy foods.  Eating too much at one time.  Dairy products until 24 to 48 hours after your watery poop stops.  You may eat foods with active cultures (probiotics). They can be found in some yogurts and supplements.  Wash your hands well to avoid spreading the illness.  Only take medicines as told by your doctor. Do not give aspirin to children. Do not take medicines for watery poop (antidiarrheals).  Ask your doctor if you should keep taking your regular medicines.  Keep all doctor visits as told. GET HELP RIGHT AWAY IF:   You cannot keep fluids down.  You do not pee at least once every 6 to 8 hours.  You are short of breath.  You see blood in your poop or throw up. This may look like coffee grounds.  You have belly (abdominal) pain that gets worse or is just in one small spot (localized).  You keep throwing up or having watery poop.  You have a fever.  The patient is a child younger than 3 months, and he or she has a fever.  The patient is a child older than 3 months, and  he or she has a fever and problems that do not go away.  The patient is a child older than 3 months, and he or she has a fever and problems that suddenly get worse.  The patient is a baby, and he or she has no tears when crying. MAKE SURE YOU:   Understand these instructions.  Will watch your condition.  Will get help right away if you are not doing well or get worse. Document Released: 03/29/2008 Document Revised: 01/03/2012 Document Reviewed: 07/28/2011 [facility] Patient Information 2015 [place], [place]. This information is not intended to replace advice given to you by your health care provider. Make sure you discuss any questions you have with your health care provider.

## 2015-06-10 ENCOUNTER — Encounter: Payer: Self-pay | Admitting: Internal Medicine

## 2015-06-10 ENCOUNTER — Ambulatory Visit (INDEPENDENT_AMBULATORY_CARE_PROVIDER_SITE_OTHER): Payer: BLUE CROSS/BLUE SHIELD | Admitting: Internal Medicine

## 2015-06-10 VITALS — BP 150/88 | HR 83 | Temp 98.5°F | Ht 67.0 in | Wt 326.0 lb

## 2015-06-10 DIAGNOSIS — E119 Type 2 diabetes mellitus without complications: Secondary | ICD-10-CM | POA: Diagnosis not present

## 2015-06-10 DIAGNOSIS — R112 Nausea with vomiting, unspecified: Secondary | ICD-10-CM | POA: Diagnosis not present

## 2015-06-10 DIAGNOSIS — R768 Other specified abnormal immunological findings in serum: Secondary | ICD-10-CM | POA: Diagnosis not present

## 2015-06-10 DIAGNOSIS — D869 Sarcoidosis, unspecified: Secondary | ICD-10-CM | POA: Diagnosis not present

## 2015-06-10 MED ORDER — ONDANSETRON HCL 4 MG PO TABS: 4.0000 mg | ORAL_TABLET | Freq: Three times a day (TID) | ORAL | Status: DC | PRN

## 2015-06-10 MED ORDER — PREDNISONE 10 MG PO TABS: ORAL_TABLET | ORAL | Status: DC

## 2015-06-10 MED ORDER — METHYLPREDNISOLONE ACETATE 80 MG/ML IJ SUSP
80.0000 mg | Freq: Once | INTRAMUSCULAR | Status: AC
  Administered 2015-06-10: 80 mg via INTRAMUSCULAR

## 2015-06-10 NOTE — Assessment & Plan Note (Signed)
For f/u Rheum as well, pt plans to call

## 2015-06-10 NOTE — Assessment & Plan Note (Signed)
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

## 2015-06-10 NOTE — Progress Notes (Signed)
Subjective:    Patient ID: Nicole Melendez, female    DOB: Jun 23, 1976, 39 y.o.   MRN: [id number]  HPI  Here with acute onset n/v/d x 6 days, seen at [facility] with dx AGE likely viral, with little improvement so far except watery stools only 3 today when has been 5 per day, no high fevers, ST, cough and Pt denies chest pain, increased sob or doe, wheezing, orthopnea, PND, increased LE swelling, palpitations, dizziness or syncope.  Denies worsening reflux, dysphagia, n/v, bowel change or blood, but does have some left sided abd pains, crampy, mild.  Denies urinary symptoms such as dysuria, frequency, urgency, flank pain, hematuria or fever, chills.  Does have bilat MCP hand swelling assoc with sardoid repeat flare similar to last, needs referral to pulm as dr clance has left practice, also plans to try to contact dr beekman/rheum as he has left [facility] practice as well.  Pt denies polydipsia, polyuria  Past Medical History  Diagnosis Date  . Morbid obesity   . Peptic ulcer disease   . GERD (gastroesophageal reflux disease)   . Migraine   . Anxiety   . Anemia   . Hyperlipidemia   . Menorrhagia   . PNA (pneumonia) july 2011  . Diabetes mellitus type II     steroid related  . Depression   . Sarcoid     including hand per rheumatology-Dr. Amil Amen  . Allergic rhinitis, cause unspecified   . Varicose veins with pain   . Positive ANA (antinuclear antibody) 02/14/2012  . Shortness of breath     on exertion  . History of blood transfusion   . Sarcoidosis of lung    Past Surgical History  Procedure Laterality Date  . Uterine ablation  03/2010  . Wisdom tooth extraction    . Abdominal hysterectomy      reports that she quit smoking about 2 years ago. Her smoking use included Cigarettes. She has a 20 pack-year smoking history. She does not have any smokeless tobacco history on file. She reports that she does not drink alcohol or use illicit drugs. family history includes Allergies in her  mother; Bone cancer in her maternal aunt; Breast cancer in her maternal aunt; Diabetes in an other family member; Heart attack in her mother; Heart disease in her father; Hypertension in an other family member; Lung cancer in her maternal aunt; Ovarian cancer in her maternal aunt; Rheum arthritis in her father. There is no history of Other. Allergies  Allergen Reactions  . Azithromycin     (z pak) hives  . Oxycodone-Acetaminophen     REACTION: difficulty breathing  . Penicillins     REACTION: swelling and difficulty breathing  . Klonopin [Clonazepam] Other (See Comments)    Memory difficulty   Current Outpatient Prescriptions on File Prior to Visit  Medication Sig Dispense Refill  . acetaminophen (TYLENOL) 500 MG tablet Take 1,000 mg by mouth every 6 (six) hours as needed. For pain    . ALPRAZolam (XANAX) 0.5 MG tablet TAKE 1 TABLET BY MOUTH THREE TIMES DAILY AS NEEDED 90 tablet 2  . B-D UF III MINI PEN NEEDLES 31G X 5 MM MISC USE FOUR TIMES DAILY FOR SLIDING SCALE AS NEEDED 200 each 0  . budesonide-formoterol (SYMBICORT) 160-4.5 MCG/ACT inhaler Inhale 2 puffs into the lungs 2 (two) times daily. 1 Inhaler 11  . cholecalciferol (VITAMIN D) 1000 UNITS tablet Take 5,000 Units by mouth daily.    . cyclobenzaprine (FLEXERIL) 5 MG  tablet Take 1 tablet (5 mg total) by mouth at bedtime as needed for muscle spasms. 30 tablet 3  . fluticasone (FLONASE) 50 MCG/ACT nasal spray Place 2 sprays into both nostrils daily as needed. For allergies 16 g 11  . insulin lispro (HUMALOG) 100 UNIT/ML injection Inject 0.12 mLs (12 Units total) into the skin 3 (three) times daily with meals. Up to 12 units tid with meals, sliding scale use as directed 10 mL 11  . methotrexate (RHEUMATREX) 2.5 MG tablet Take as directed    . naproxen (NAPROSYN) 500 MG tablet TAKE 1 TABLET BY MOUTH TWICE DAILY WITH A MEAL 60 tablet 5  . naproxen sodium (ANAPROX) 220 MG tablet Take 220 mg by mouth 2 (two) times daily with a meal.    .  omeprazole (PRILOSEC) 20 MG capsule Take 1 capsule (20 mg total) by mouth daily. 30 capsule 0  . sertraline (ZOLOFT) 100 MG tablet 2 tabs by mouth per day (Patient taking differently: Take 200 mg by mouth daily. ) 180 tablet 3  . traMADol (ULTRAM) 50 MG tablet   3  . vitamin B-12 (CYANOCOBALAMIN) 1000 MCG tablet Take 1,000 mcg by mouth daily.    . cetirizine (ZYRTEC) 10 MG tablet TAKE 1 TABLET BY MOUTH EVERY DAY (Patient not taking: Reported on 06/10/2015) 30 tablet 5  . fluconazole (DIFLUCAN) 150 MG tablet 1 tab by mouth every 3 days as needed (Patient not taking: Reported on 06/10/2015) 2 tablet 1  . levalbuterol (XOPENEX HFA) 45 MCG/ACT inhaler Inhale 1-2 puffs into the lungs 2 (two) times daily. (Patient not taking: Reported on 06/10/2015) 15 g 11  . levofloxacin (LEVAQUIN) 500 MG tablet Take 1 tablet (500 mg total) by mouth daily. (Patient not taking: Reported on 06/10/2015) 10 tablet 0  . sulfamethoxazole-trimethoprim (BACTRIM DS,SEPTRA DS) 800-160 MG per tablet Take 1 tablet by mouth 2 (two) times daily. (Patient not taking: Reported on 06/10/2015) 14 tablet 0   No current facility-administered medications on file prior to visit.   Review of Systems  Constitutional: Negative for unusual diaphoresis or night sweats HENT: Negative for ringing in ear or discharge Eyes: Negative for double vision or worsening visual disturbance.  Respiratory: Negative for choking and stridor.   Gastrointestinal: Negative for vomiting or other signifcant bowel change Genitourinary: Negative for hematuria or change in urine volume.  Musculoskeletal: Negative for other MSK pain or swelling Skin: Negative for color change and worsening wound.  Neurological: Negative for tremors and numbness other than noted  Psychiatric/Behavioral: Negative for decreased concentration or agitation other than above       Objective:   Physical Exam BP 150/88 mmHg  Pulse 83  Temp(Src) 98.5 F (36.9 C) (Oral)  Ht 5\' 7"  (1.702 m)   Wt 326 lb (147.873 kg)  BMI 51.05 kg/m2  SpO2 99%  LMP 07/17/2012 VS noted, non toxic appearing  Constitutional: Pt appears in no significant distress HENT: Head: NCAT.  Right Ear: External ear normal.  Left Ear: External ear normal.  Eyes: . Pupils are equal, round, and reactive to light. Conjunctivae and EOM are normal Neck: Normal range of motion. Neck supple.  Cardiovascular: Normal rate and regular rhythm.   Pulmonary/Chest: Effort normal and breath sounds without rales or wheezing.  Abd:  Soft, NT, ND, + BSexcep tmile left side/LLQ tender, no guarding or rebound Neurological: Pt is alert. Not confused , motor grossly intact Skin: Skin is warm. No rash, no LE edema Psychiatric: Pt behavior is normal. No agitation.  Assessment & Plan:

## 2015-06-10 NOTE — Assessment & Plan Note (Signed)
stable overall by history and exam, recent data reviewed with pt, and pt to continue medical treatment as before,  to f/u any worsening symptoms or concerns Lab Results  Component Value Date   HGBA1C 5.8 01/22/2015   To call for onset polys or sugars > 200 with steroid tx

## 2015-06-10 NOTE — Addendum Note (Signed)
Addended by: Lyman Bishop on: 06/10/2015 05:34 PM   Modules accepted: Orders

## 2015-06-10 NOTE — Assessment & Plan Note (Addendum)
Mild, with watery stools c/w AGE, for zofran prn, cont lomotil prn, declines labs today,  to f/u any worsening symptoms or concerns

## 2015-06-10 NOTE — Patient Instructions (Signed)
You had the steroid shot today  Please take all new medication as prescribed  - the prednisone, and zofran as needed  You will be contacted regarding the referral for: pulmonary  Please continue all other medications as before, and refills have been done if requested.  Please have the pharmacy call with any other refills you may need.  Please keep your appointments with your specialists as you may have planned

## 2015-06-21 ENCOUNTER — Telehealth: Payer: Self-pay | Admitting: Critical Care Medicine

## 2015-06-21 ENCOUNTER — Other Ambulatory Visit: Payer: Self-pay | Admitting: Internal Medicine

## 2015-06-21 MED ORDER — PREDNISONE 10 MG PO TABS: ORAL_TABLET | ORAL | Status: DC

## 2015-06-21 MED ORDER — PREDNISONE 5 MG PO TABS: 5.0000 mg | ORAL_TABLET | Freq: Every day | ORAL | Status: DC

## 2015-06-21 NOTE — Telephone Encounter (Signed)
[facility] office pt with sarcoid flare. I sent pred pulse Needs OV this week with TP then assignment to new pulm MD in sept/october

## 2015-06-23 NOTE — Telephone Encounter (Signed)
Spoke with pt and advised of Dr Bettina Gavia recommendations.  Pt scheduled to see Tammy Parrett 06/27/15 at 2:45

## 2015-06-23 NOTE — Telephone Encounter (Signed)
[place] x 1 ([phone])

## 2015-06-24 ENCOUNTER — Encounter: Payer: Self-pay | Admitting: Internal Medicine

## 2015-06-24 ENCOUNTER — Ambulatory Visit (INDEPENDENT_AMBULATORY_CARE_PROVIDER_SITE_OTHER): Payer: BLUE CROSS/BLUE SHIELD | Admitting: Internal Medicine

## 2015-06-24 VITALS — BP 138/88 | HR 77 | Temp 97.6°F | Ht 67.0 in | Wt 329.0 lb

## 2015-06-24 DIAGNOSIS — D869 Sarcoidosis, unspecified: Secondary | ICD-10-CM | POA: Diagnosis not present

## 2015-06-24 DIAGNOSIS — B370 Candidal stomatitis: Secondary | ICD-10-CM

## 2015-06-24 DIAGNOSIS — R112 Nausea with vomiting, unspecified: Secondary | ICD-10-CM | POA: Diagnosis not present

## 2015-06-24 MED ORDER — NYSTATIN 100000 UNIT/ML MT SUSP: 500000.0000 [IU] | Freq: Four times a day (QID) | OROMUCOSAL | Status: DC

## 2015-06-24 NOTE — Progress Notes (Signed)
Pre visit review using our clinic review tool, if applicable. No additional management support is needed unless otherwise documented below in the visit note. 

## 2015-06-24 NOTE — Progress Notes (Signed)
Subjective:    Patient ID: Nicole Melendez, female    DOB: 12-08-1975, 39 y.o.   MRN: [id number]  HPI  Here to f/u, Pt denies chest pain, increasing sob or doe, wheezing, orthopnea, PND, increased LE swelling, palpitations, dizziness or syncope.  Pt denies new neurological symptoms such as new headache, or facial or extremity weakness or numbness.  Pt denies polydipsia, polyuria, or low sugar episode.   Pt denies new neurological symptoms such as new headache, or facial or extremity weakness or numbness.   Pt states overall good compliance with meds.  Recent AGE symptoms resolved, as welll as sarcoid flare , needs FMLA filled out for each episode out of work.  Does have thrush like rash to tongue after recent steroid tx. Past Medical History  Diagnosis Date  . Morbid obesity   . Peptic ulcer disease   . GERD (gastroesophageal reflux disease)   . Migraine   . Anxiety   . Anemia   . Hyperlipidemia   . Menorrhagia   . PNA (pneumonia) july 2011  . Diabetes mellitus type II     steroid related  . Depression   . Sarcoid     including hand per rheumatology-Dr. Amil Amen  . Allergic rhinitis, cause unspecified   . Varicose veins with pain   . Positive ANA (antinuclear antibody) 02/14/2012  . Shortness of breath     on exertion  . History of blood transfusion   . Sarcoidosis of lung    Past Surgical History  Procedure Laterality Date  . Uterine ablation  03/2010  . Wisdom tooth extraction    . Abdominal hysterectomy      reports that she quit smoking about 2 years ago. Her smoking use included Cigarettes. She has a 20 pack-year smoking history. She does not have any smokeless tobacco history on file. She reports that she does not drink alcohol or use illicit drugs. family history includes Allergies in her mother; Bone cancer in her maternal aunt; Breast cancer in her maternal aunt; Diabetes in an other family member; Heart attack in her mother; Heart disease in her father; Hypertension in an  other family member; Lung cancer in her maternal aunt; Ovarian cancer in her maternal aunt; Rheum arthritis in her father. There is no history of Other. Allergies  Allergen Reactions  . Azithromycin     (z pak) hives  . Oxycodone-Acetaminophen     REACTION: difficulty breathing  . Penicillins     REACTION: swelling and difficulty breathing  . Klonopin [Clonazepam] Other (See Comments)    Memory difficulty   Current Outpatient Prescriptions on File Prior to Visit  Medication Sig Dispense Refill  . acetaminophen (TYLENOL) 500 MG tablet Take 1,000 mg by mouth every 6 (six) hours as needed. For pain    . ALPRAZolam (XANAX) 0.5 MG tablet TAKE 1 TABLET BY MOUTH THREE TIMES DAILY AS NEEDED 90 tablet 2  . B-D UF III MINI PEN NEEDLES 31G X 5 MM MISC USE FOUR TIMES DAILY FOR SLIDING SCALE AS NEEDED 200 each 0  . budesonide-formoterol (SYMBICORT) 160-4.5 MCG/ACT inhaler Inhale 2 puffs into the lungs 2 (two) times daily. 1 Inhaler 11  . cholecalciferol (VITAMIN D) 1000 UNITS tablet Take 5,000 Units by mouth daily.    . cyclobenzaprine (FLEXERIL) 5 MG tablet Take 1 tablet (5 mg total) by mouth at bedtime as needed for muscle spasms. 30 tablet 3  . fluticasone (FLONASE) 50 MCG/ACT nasal spray Place 2 sprays into both nostrils daily  as needed. For allergies 16 g 11  . insulin lispro (HUMALOG) 100 UNIT/ML injection Inject 0.12 mLs (12 Units total) into the skin 3 (three) times daily with meals. Up to 12 units tid with meals, sliding scale use as directed 10 mL 11  . methotrexate (RHEUMATREX) 2.5 MG tablet Take as directed    . naproxen (NAPROSYN) 500 MG tablet TAKE 1 TABLET BY MOUTH TWICE DAILY WITH A MEAL 60 tablet 5  . naproxen sodium (ANAPROX) 220 MG tablet Take 220 mg by mouth 2 (two) times daily with a meal.    . omeprazole (PRILOSEC) 20 MG capsule Take 1 capsule (20 mg total) by mouth daily. 30 capsule 0  . ondansetron (ZOFRAN) 4 MG tablet Take 1 tablet (4 mg total) by mouth every 8 (eight) hours as  needed for nausea or vomiting. 15 tablet 0  . predniSONE (DELTASONE) 10 MG tablet Take 4 for three days 3 for three days 2 for three days 1 for three days and then resume 5mg  prednisone daily 30 tablet 0  . predniSONE (DELTASONE) 5 MG tablet Take 1 tablet (5 mg total) by mouth daily with breakfast. When current prednisone pulse of 10mg  dosing ends 60 tablet 6  . sertraline (ZOLOFT) 100 MG tablet 2 tabs by mouth per day (Patient taking differently: Take 200 mg by mouth daily. ) 180 tablet 3  . traMADol (ULTRAM) 50 MG tablet   3  . vitamin B-12 (CYANOCOBALAMIN) 1000 MCG tablet Take 1,000 mcg by mouth daily.    . cetirizine (ZYRTEC) 10 MG tablet TAKE 1 TABLET BY MOUTH EVERY DAY (Patient not taking: Reported on 06/10/2015) 30 tablet 5  . fluconazole (DIFLUCAN) 150 MG tablet 1 tab by mouth every 3 days as needed (Patient not taking: Reported on 06/10/2015) 2 tablet 1  . levalbuterol (XOPENEX HFA) 45 MCG/ACT inhaler Inhale 1-2 puffs into the lungs 2 (two) times daily. (Patient not taking: Reported on 06/10/2015) 15 g 11  . levofloxacin (LEVAQUIN) 500 MG tablet Take 1 tablet (500 mg total) by mouth daily. (Patient not taking: Reported on 06/10/2015) 10 tablet 0  . sulfamethoxazole-trimethoprim (BACTRIM DS,SEPTRA DS) 800-160 MG per tablet Take 1 tablet by mouth 2 (two) times daily. (Patient not taking: Reported on 06/10/2015) 14 tablet 0   No current facility-administered medications on file prior to visit.   Review of Systems  Constitutional: Negative for unusual diaphoresis or night sweats HENT: Negative for ringing in ear or discharge Eyes: Negative for double vision or worsening visual disturbance.  Respiratory: Negative for choking and stridor.   Gastrointestinal: Negative for vomiting or other signifcant bowel change Genitourinary: Negative for hematuria or change in urine volume.  Musculoskeletal: Negative for other MSK pain or swelling Skin: Negative for color change and worsening wound.    Neurological: Negative for tremors and numbness other than noted  Psychiatric/Behavioral: Negative for decreased concentration or agitation other than above       Objective:   Physical Exam BP 138/88 mmHg  Pulse 77  Temp(Src) 97.6 F (36.4 C) (Oral)  Ht 5\' 7"  (1.702 m)  Wt 329 lb (149.233 kg)  BMI 51.52 kg/m2  SpO2 98%  LMP 07/17/2012 VS noted,  Constitutional: Pt appears in no significant distress HENT: Head: NCAT.  Right Ear: External ear normal.  Left Ear: External ear normal.  Eyes: . Pupils are equal, round, and reactive to light. Conjunctivae and EOM are normal Neck: Normal range of motion. Neck supple.  Cardiovascular: Normal rate and regular rhythm.  Pulmonary/Chest: Effort normal and breath sounds without rales or wheezing.  Abd:  Soft, NT, ND, + BS Neurological: Pt is alert. Not confused , motor grossly intact Skin: Skin is warm. No rash except tongue white coating, no LE edema, no hand swelling Psychiatric: Pt behavior is normal. No agitation.     Assessment & Plan:

## 2015-06-24 NOTE — Patient Instructions (Addendum)
Please take all new medication as prescribed - the mouth solution  Please continue all other medications as before, and refills have been done if requested.  Please have the pharmacy call with any other refills you may need.  Please keep your appointments with your specialists as you may have planned  We will finish the paperwork and fax

## 2015-06-25 DIAGNOSIS — Z0279 Encounter for issue of other medical certificate: Secondary | ICD-10-CM

## 2015-06-26 NOTE — Assessment & Plan Note (Signed)
With recent flare arthritic symptoms now resolved post steroid tx, for FMLA filled out for dates off work

## 2015-06-26 NOTE — Assessment & Plan Note (Signed)
Resolved, exam benign, ok for FMLA filled out for aug 13 and 14 dates away from work

## 2015-06-26 NOTE — Assessment & Plan Note (Signed)
Mild, for nystatin soln asd, . to f/u any worsening symptoms or concerns  

## 2015-06-27 ENCOUNTER — Ambulatory Visit: Payer: Self-pay | Admitting: Adult Health

## 2015-06-28 ENCOUNTER — Encounter: Payer: Self-pay | Admitting: Family Medicine

## 2015-06-28 ENCOUNTER — Ambulatory Visit (INDEPENDENT_AMBULATORY_CARE_PROVIDER_SITE_OTHER): Payer: BLUE CROSS/BLUE SHIELD | Admitting: Family Medicine

## 2015-06-28 VITALS — BP 108/80 | Temp 98.8°F | Ht 67.0 in | Wt 328.0 lb

## 2015-06-28 DIAGNOSIS — H6692 Otitis media, unspecified, left ear: Secondary | ICD-10-CM

## 2015-06-28 DIAGNOSIS — H6690 Otitis media, unspecified, unspecified ear: Secondary | ICD-10-CM

## 2015-06-28 DIAGNOSIS — N760 Acute vaginitis: Secondary | ICD-10-CM | POA: Diagnosis not present

## 2015-06-28 HISTORY — DX: Otitis media, unspecified, unspecified ear: H66.90

## 2015-06-28 MED ORDER — CIPROFLOXACIN HCL 500 MG PO TABS: 500.0000 mg | ORAL_TABLET | Freq: Two times a day (BID) | ORAL | Status: DC

## 2015-06-28 MED ORDER — FLUCONAZOLE 150 MG PO TABS: 150.0000 mg | ORAL_TABLET | ORAL | Status: DC

## 2015-06-28 NOTE — Progress Notes (Signed)
Pre visit review using our clinic review tool, if applicable. No additional management support is needed unless otherwise documented below in the visit note. 

## 2015-06-28 NOTE — Patient Instructions (Signed)
Probiotic daily such as Dig[employer]age or [facility]    Otitis Media Otitis media is redness, soreness, and inflammation of the middle ear. Otitis media may be caused by allergies or, most commonly, by infection. Often it occurs as a complication of the common cold. SIGNS AND SYMPTOMS Symptoms of otitis media may include:  Earache.  Fever.  Ringing in your ear.  Headache.  Leakage of fluid from the ear. DIAGNOSIS To diagnose otitis media, your health care provider will examine your ear with an otoscope. This is an instrument that allows your health care provider to see into your ear in order to examine your eardrum. Your health care provider also will ask you questions about your symptoms. TREATMENT  Typically, otitis media resolves on its own within 3-5 days. Your health care provider may prescribe medicine to ease your symptoms of pain. If otitis media does not resolve within 5 days or is recurrent, your health care provider may prescribe antibiotic medicines if he or she suspects that a bacterial infection is the cause. HOME CARE INSTRUCTIONS   If you were prescribed an antibiotic medicine, finish it all even if you start to feel better.  Take medicines only as directed by your health care provider.  Keep all follow-up visits as directed by your health care provider. SEEK MEDICAL CARE IF:  You have otitis media only in one ear, or bleeding from your nose, or both.  You notice a lump on your neck.  You are not getting better in 3-5 days.  You feel worse instead of better. SEEK IMMEDIATE MEDICAL CARE IF:   You have pain that is not controlled with medicine.  You have swelling, redness, or pain around your ear or stiffness in your neck.  You notice that part of your face is paralyzed.  You notice that the bone behind your ear (mastoid) is tender when you touch it. MAKE SURE YOU:   Understand these instructions.  Will watch your condition.  Will get  help right away if you are not doing well or get worse. Document Released: 07/16/2004 Document Revised: 02/25/2014 Document Reviewed: 05/08/2013 [facility] Patient Information 2015 [place], [place]. This information is not intended to replace advice given to you by your health care provider. Make sure you discuss any questions you have with your health care provider.

## 2015-06-28 NOTE — Progress Notes (Signed)
Subjective:    Patient ID: Nicole Melendez, female    DOB: 09-Sep-1976, 39 y.o.   MRN: [id number]  Chief Complaint  Patient presents with  . bilateral ear pain    HPI Patient is in today for evaluation of ear pain. She has recently been struggling with a sarcoid flare which required a steroid increase. This she feels lead to thrush and further immune compromise. She is now struggling with left ear pain and she noted some discharge earlier in the week. Her right ear felt a little clogged but is better. No HA or fever. Does note some increased PND and SOB and has restarted her Symbicort with good results. No GI or GU concerns  Past Medical History  Diagnosis Date  . Morbid obesity   . Peptic ulcer disease   . GERD (gastroesophageal reflux disease)   . Migraine   . Anxiety   . Anemia   . Hyperlipidemia   . Menorrhagia   . PNA (pneumonia) july 2011  . Diabetes mellitus type II     steroid related  . Depression   . Sarcoid     including hand per rheumatology-Dr. Amil Amen  . Allergic rhinitis, cause unspecified   . Varicose veins with pain   . Positive ANA (antinuclear antibody) 02/14/2012  . Shortness of breath     on exertion  . History of blood transfusion   . Sarcoidosis of lung     Past Surgical History  Procedure Laterality Date  . Uterine ablation  03/2010  . Wisdom tooth extraction    . Abdominal hysterectomy      Family History  Problem Relation Age of Onset  . Diabetes    . Hypertension    . Allergies Mother   . Heart attack Mother   . Heart disease Father   . Rheum arthritis Father   . Ovarian cancer Maternal Aunt   . Lung cancer Maternal Aunt   . Breast cancer Maternal Aunt   . Bone cancer Maternal Aunt   . Other Neg Hx     Social History   Social History  . Marital Status: Single    Spouse Name: N/A  . Number of Children: 0  . Years of Education: N/A   Occupational History  . tobacco worker    Social History Main Topics  . Smoking status:  Former Smoker -- 1.00 packs/day for 20 years    Types: Cigarettes    Quit date: 10/25/2012  . Smokeless tobacco: Not on file     Comment: QUIT 04/2010 AND STARTED BACK 2014 X 3 MONTHS. less than 1 ppd.  started at age 38.    Marland Kitchen Alcohol Use: No  . Drug Use: No  . Sexual Activity: Yes    Birth Control/ Protection: None   Other Topics Concern  . Not on file   Social History Narrative   Pt lives with [name]- also pt of [facility]    Outpatient Prescriptions Prior to Visit  Medication Sig Dispense Refill  . acetaminophen (TYLENOL) 500 MG tablet Take 1,000 mg by mouth every 6 (six) hours as needed. For pain    . ALPRAZolam (XANAX) 0.5 MG tablet TAKE 1 TABLET BY MOUTH THREE TIMES DAILY AS NEEDED 90 tablet 2  . B-D UF III MINI PEN NEEDLES 31G X 5 MM MISC USE FOUR TIMES DAILY FOR SLIDING SCALE AS NEEDED 200 each 0  . budesonide-formoterol (SYMBICORT) 160-4.5 MCG/ACT inhaler Inhale 2 puffs into the lungs 2 (two) times  daily. 1 Inhaler 11  . cetirizine (ZYRTEC) 10 MG tablet TAKE 1 TABLET BY MOUTH EVERY DAY 30 tablet 5  . cholecalciferol (VITAMIN D) 1000 UNITS tablet Take 5,000 Units by mouth daily.    . cyclobenzaprine (FLEXERIL) 5 MG tablet Take 1 tablet (5 mg total) by mouth at bedtime as needed for muscle spasms. 30 tablet 3  . fluconazole (DIFLUCAN) 150 MG tablet 1 tab by mouth every 3 days as needed 2 tablet 1  . fluticasone (FLONASE) 50 MCG/ACT nasal spray Place 2 sprays into both nostrils daily as needed. For allergies 16 g 11  . insulin lispro (HUMALOG) 100 UNIT/ML injection Inject 0.12 mLs (12 Units total) into the skin 3 (three) times daily with meals. Up to 12 units tid with meals, sliding scale use as directed 10 mL 11  . levalbuterol (XOPENEX HFA) 45 MCG/ACT inhaler Inhale 1-2 puffs into the lungs 2 (two) times daily. 15 g 11  . levofloxacin (LEVAQUIN) 500 MG tablet Take 1 tablet (500 mg total) by mouth daily. 10 tablet 0  . methotrexate (RHEUMATREX) 2.5 MG tablet Take as directed    .  naproxen (NAPROSYN) 500 MG tablet TAKE 1 TABLET BY MOUTH TWICE DAILY WITH A MEAL 60 tablet 5  . naproxen sodium (ANAPROX) 220 MG tablet Take 220 mg by mouth 2 (two) times daily with a meal.    . nystatin (MYCOSTATIN) 100000 UNIT/ML suspension Take 5 mLs (500,000 Units total) by mouth 4 (four) times daily. 180 mL 0  . omeprazole (PRILOSEC) 20 MG capsule Take 1 capsule (20 mg total) by mouth daily. 30 capsule 0  . ondansetron (ZOFRAN) 4 MG tablet Take 1 tablet (4 mg total) by mouth every 8 (eight) hours as needed for nausea or vomiting. 15 tablet 0  . predniSONE (DELTASONE) 10 MG tablet Take 4 for three days 3 for three days 2 for three days 1 for three days and then resume 5mg  prednisone daily 30 tablet 0  . predniSONE (DELTASONE) 5 MG tablet Take 1 tablet (5 mg total) by mouth daily with breakfast. When current prednisone pulse of 10mg  dosing ends 60 tablet 6  . sertraline (ZOLOFT) 100 MG tablet 2 tabs by mouth per day (Patient taking differently: Take 200 mg by mouth daily. ) 180 tablet 3  . sulfamethoxazole-trimethoprim (BACTRIM DS,SEPTRA DS) 800-160 MG per tablet Take 1 tablet by mouth 2 (two) times daily. 14 tablet 0  . traMADol (ULTRAM) 50 MG tablet   3  . vitamin B-12 (CYANOCOBALAMIN) 1000 MCG tablet Take 1,000 mcg by mouth daily.     No facility-administered medications prior to visit.    Allergies  Allergen Reactions  . Azithromycin     (z pak) hives  . Oxycodone-Acetaminophen     REACTION: difficulty breathing  . Penicillins     REACTION: swelling and difficulty breathing  . Klonopin [Clonazepam] Other (See Comments)    Memory difficulty    Review of Systems  Constitutional: Positive for malaise/fatigue. Negative for fever.  HENT: Positive for congestion, ear discharge and ear pain.   Eyes: Negative for discharge.  Respiratory: Negative for shortness of breath.   Cardiovascular: Negative for chest pain, palpitations and leg swelling.  Gastrointestinal: Negative for nausea  and abdominal pain.  Genitourinary: Negative for dysuria.  Musculoskeletal: Positive for myalgias. Negative for falls.  Skin: Negative for rash.  Neurological: Positive for dizziness. Negative for loss of consciousness and headaches.  Endo/Heme/Allergies: Negative for environmental allergies.  Psychiatric/Behavioral: Negative for depression. The patient  is not nervous/anxious.        Objective:    Physical Exam  Constitutional: She is oriented to person, place, and time. She appears well-developed and well-nourished. No distress.  HENT:  Head: Normocephalic and atraumatic.  Right Ear: External ear normal.  Nose: Nose normal.  Left canal mildly erythematous, left TM erythematous and retracted  Eyes: Right eye exhibits no discharge. Left eye exhibits no discharge.  Neck: Normal range of motion. Neck supple.  Cardiovascular: Normal rate and regular rhythm.   No murmur heard. Pulmonary/Chest: Effort normal and breath sounds normal.  Abdominal: Soft. Bowel sounds are normal. There is no tenderness.  Musculoskeletal: She exhibits no edema.  Neurological: She is alert and oriented to person, place, and time.  Skin: Skin is warm and dry.  Psychiatric: She has a normal mood and affect.  Nursing note and vitals reviewed.   BP 108/80 mmHg  Temp(Src) 98.8 F (37.1 C) (Oral)  Ht 5\' 7"  (1.702 m)  Wt 328 lb (148.78 kg)  BMI 51.36 kg/m2  LMP 07/17/2012 Wt Readings from Last 3 Encounters:  06/28/15 328 lb (148.78 kg)  06/24/15 329 lb (149.233 kg)  06/10/15 326 lb (147.873 kg)     Lab Results  Component Value Date   WBC 8.4 02/04/2015   HGB 12.4 02/04/2015   HCT 36.5 02/04/2015   PLT 235 02/04/2015   GLUCOSE 96 02/04/2015   CHOL 210* 10/17/2014   TRIG 101.0 10/17/2014   HDL 58.30 10/17/2014   LDLDIRECT 135.0 07/05/2011   LDLCALC 132* 10/17/2014   ALT 16 02/04/2015   AST 18 02/04/2015   NA 137 02/04/2015   K 4.0 02/04/2015   CL 104 02/04/2015   CREATININE 0.72 02/04/2015     BUN 15 02/04/2015   CO2 25 02/04/2015   TSH 1.01 01/08/2013   INR 1.3 11/17/2012   HGBA1C 5.8 01/22/2015   MICROALBUR 0.7 01/08/2013    Lab Results  Component Value Date   TSH 1.01 01/08/2013   Lab Results  Component Value Date   WBC 8.4 02/04/2015   HGB 12.4 02/04/2015   HCT 36.5 02/04/2015   MCV 86.5 02/04/2015   PLT 235 02/04/2015   Lab Results  Component Value Date   NA 137 02/04/2015   K 4.0 02/04/2015   CO2 25 02/04/2015   GLUCOSE 96 02/04/2015   BUN 15 02/04/2015   CREATININE 0.72 02/04/2015   BILITOT 0.4 02/04/2015   ALKPHOS 123* 02/04/2015   AST 18 02/04/2015   ALT 16 02/04/2015   PROT 7.2 02/04/2015   ALBUMIN 3.8 02/04/2015   CALCIUM 9.2 02/04/2015   ANIONGAP 8 02/04/2015   GFR 114.78 01/22/2015   Lab Results  Component Value Date   CHOL 210* 10/17/2014   Lab Results  Component Value Date   HDL 58.30 10/17/2014   Lab Results  Component Value Date   LDLCALC 132* 10/17/2014   Lab Results  Component Value Date   TRIG 101.0 10/17/2014   Lab Results  Component Value Date   CHOLHDL 4 10/17/2014   Lab Results  Component Value Date   HGBA1C 5.8 01/22/2015       Assessment & Plan:   Otitis media Left, some irritation in the left canal as well, mild erythema, started on Ciprofloxacin with probiotics and report if worsens.  Vaginitis and vulvovaginitis Given rx for diflucan and encouraged probiotics   I am having Ms. Mcmillion maintain her acetaminophen, B-D UF III MINI PEN NEEDLES, fluticasone, levalbuterol, budesonide-formoterol, sertraline, cyclobenzaprine,  insulin lispro, naproxen sodium, vitamin B-12, cholecalciferol, omeprazole, naproxen, methotrexate, levofloxacin, fluconazole, cetirizine, traMADol, sulfamethoxazole-trimethoprim, ALPRAZolam, ondansetron, predniSONE, predniSONE, and nystatin.  No orders of the defined types were placed in this encounter.     Penni Homans, MD

## 2015-06-28 NOTE — Assessment & Plan Note (Signed)
Left, some irritation in the left canal as well, mild erythema, started on Ciprofloxacin with probiotics and report if worsens.

## 2015-06-29 DIAGNOSIS — N760 Acute vaginitis: Secondary | ICD-10-CM | POA: Insufficient documentation

## 2015-06-29 NOTE — Assessment & Plan Note (Signed)
Given rx for diflucan and encouraged probiotics

## 2015-07-01 ENCOUNTER — Telehealth: Payer: Self-pay

## 2015-07-01 NOTE — Telephone Encounter (Signed)
Form faxed to [place].  Pt informed and will pick up a copy in the morning.  Copy is UP Front.

## 2015-07-02 ENCOUNTER — Ambulatory Visit: Payer: Self-pay | Admitting: Adult Health

## 2015-07-02 ENCOUNTER — Encounter: Payer: Self-pay | Admitting: Adult Health

## 2015-07-02 ENCOUNTER — Ambulatory Visit (INDEPENDENT_AMBULATORY_CARE_PROVIDER_SITE_OTHER): Payer: BLUE CROSS/BLUE SHIELD | Admitting: Adult Health

## 2015-07-02 VITALS — BP 126/80 | HR 85 | Temp 98.2°F | Ht 67.0 in | Wt 324.0 lb

## 2015-07-02 DIAGNOSIS — Z23 Encounter for immunization: Secondary | ICD-10-CM

## 2015-07-02 DIAGNOSIS — R059 Cough, unspecified: Secondary | ICD-10-CM | POA: Insufficient documentation

## 2015-07-02 DIAGNOSIS — D869 Sarcoidosis, unspecified: Secondary | ICD-10-CM | POA: Diagnosis not present

## 2015-07-02 DIAGNOSIS — R05 Cough: Secondary | ICD-10-CM | POA: Insufficient documentation

## 2015-07-02 MED ORDER — FOLIC ACID 1 MG PO TABS: 1.0000 mg | ORAL_TABLET | Freq: Every day | ORAL | Status: DC

## 2015-07-02 NOTE — Progress Notes (Signed)
   Subjective:    Patient ID: Nicole Melendez, female    DOB: April 11, 1976, 39 y.o.   MRN: [id number]  HPI 39 yo morbidly obese former smoker female with sarcoid manifested as arthritis and DOE  07/02/2015 Follow up : Sarcoid  Pt returns for follow up . Former pt of Dr. Gwenette Greet , to establish with Dr. Lamonte Sakai  Last this year.  She has sarcoid on methotrexate and chronic prednisone at 5mg  daily  Had recent sarcoid flare with increased joint pain/swelling in hands and increased cough and dyspnea.  She was given prednisone burst. On 8/27. Says she if felling much better with decreased joint pain and breathing has improved. Previously tapered off symbicort in May but restarted last month when breathing worsening . Feels it has helped.  Currently on prednisone 10mg  daily .  Follows with rheumatology , on MTX and Folic acid.  Last cxr in April this year with chronic changes.  Needs refill of folic acid .  Denies chest pain, orthopnea, edema or fever. No rash .   Review of Systems Constitutional:   No  weight loss, night sweats,  Fevers, chills +, fatigue, or  lassitude.  HEENT:   No headaches,  Difficulty swallowing,  Tooth/dental problems, or  Sore throat,                No sneezing, itching, ear ache, nasal congestion, post nasal drip,   CV:  No chest pain,  Orthopnea, PND, swelling in lower extremities, anasarca, dizziness, palpitations, syncope.   GI  No heartburn, indigestion, abdominal pain, nausea, vomiting, diarrhea, change in bowel habits, loss of appetite, bloody stools.   Resp:   No chest wall deformity  Skin: no rash or lesions.  GU: no dysuria, change in color of urine, no urgency or frequency.  No flank pain, no hematuria   MS:  +jjoint pain    No decreased range of motion.  No back pain.  Psych:  No change in mood or affect. No depression or anxiety.  No memory loss.         Objective:   Physical Exam GEN: A/Ox3; pleasant , NAD, morbidly obese   HEENT:  [place]/AT,   EACs-clear, TMs-wnl, NOSE-clear, THROAT-clear, no lesions, no postnasal drip or exudate noted.   NECK:  Supple w/ fair ROM; no JVD; normal carotid impulses w/o bruits; no thyromegaly or nodules palpated; no lymphadenopathy.  RESP  Clear  P & A; w/o, wheezes/ rales/ or rhonchi.no accessory muscle use, no dullness to percussion  CARD:  RRR, no m/r/g  , no peripheral edema, pulses intact, no cyanosis or clubbing.  GI:   Soft & nt; nml bowel sounds; no organomegaly or masses detected.  Musco: Warm bil, no deformities or joint swelling noted.   Neuro: alert, no focal deficits noted.    Skin: Warm, no lesions or rashes       Assessment & Plan:

## 2015-07-02 NOTE — Patient Instructions (Addendum)
Taper prednisone 10mg  daily for 1 week then back to 5mg  daily and hold .  Can continue on Symbicort 2 puffs Twice daily  , rinse after use.  Flu shot today .  Follow up Dr. Lamonte Sakai  As planned in November.  Please contact office for sooner follow up if symptoms do not improve or worsen or seek emergency care

## 2015-07-02 NOTE — Assessment & Plan Note (Signed)
Cough ? Sarcoid related /RAD  Improved with steroids and symbicort   Plan  Taper prednisone 10mg  daily for 1 week then back to 5mg  daily and hold .  Can continue on Symbicort 2 puffs Twice daily  , rinse after use.  Flu shot today .  Follow up Dr. Lamonte Sakai  As planned in November.  Please contact office for sooner follow up if symptoms do not improve or worsen or seek emergency care

## 2015-07-02 NOTE — Assessment & Plan Note (Addendum)
Recent flare , improved and near baseline with steroid burst She will continue on slow steroid taper to 5mg  daily and hold.  Remain on MTX   Plan  Taper prednisone 10mg  daily for 1 week then back to 5mg  daily and hold .  Can continue on Symbicort 2 puffs Twice daily  , rinse after use.  Flu shot today .  Follow up Dr. Lamonte Sakai  As planned in November.  Please contact office for sooner follow up if symptoms do not improve or worsen or seek emergency care

## 2015-07-03 ENCOUNTER — Ambulatory Visit: Payer: Self-pay | Admitting: Adult Health

## 2015-07-21 ENCOUNTER — Encounter: Payer: Self-pay | Admitting: Internal Medicine

## 2015-07-22 ENCOUNTER — Telehealth: Payer: Self-pay | Admitting: *Deleted

## 2015-07-22 MED ORDER — GLUCOSE BLOOD VI STRP: 1.0000 | ORAL_STRIP | Freq: Four times a day (QID) | Status: DC

## 2015-07-22 NOTE — Telephone Encounter (Signed)
Pt sent email needing rx for one touch strips sent to pharmacy...Nicole Melendez

## 2015-07-29 ENCOUNTER — Encounter: Payer: Self-pay | Admitting: Internal Medicine

## 2015-07-29 ENCOUNTER — Encounter: Payer: Self-pay | Admitting: Emergency Medicine

## 2015-07-29 ENCOUNTER — Ambulatory Visit (INDEPENDENT_AMBULATORY_CARE_PROVIDER_SITE_OTHER): Payer: BLUE CROSS/BLUE SHIELD | Admitting: Internal Medicine

## 2015-07-29 ENCOUNTER — Other Ambulatory Visit (INDEPENDENT_AMBULATORY_CARE_PROVIDER_SITE_OTHER): Payer: Self-pay

## 2015-07-29 VITALS — BP 130/82 | HR 84 | Temp 98.1°F | Resp 16 | Wt 323.0 lb

## 2015-07-29 DIAGNOSIS — E1140 Type 2 diabetes mellitus with diabetic neuropathy, unspecified: Secondary | ICD-10-CM | POA: Diagnosis not present

## 2015-07-29 DIAGNOSIS — Z794 Long term (current) use of insulin: Secondary | ICD-10-CM

## 2015-07-29 LAB — HEPATIC FUNCTION PANEL
ALBUMIN: 3.8 g/dL (ref 3.5–5.2)
ALT: 13 U/L (ref 0–35)
AST: 15 U/L (ref 0–37)
Alkaline Phosphatase: 100 U/L (ref 39–117)
Bilirubin, Direct: 0.2 mg/dL (ref 0.0–0.3)
TOTAL PROTEIN: 7.1 g/dL (ref 6.0–8.3)
Total Bilirubin: 0.4 mg/dL (ref 0.2–1.2)

## 2015-07-29 LAB — BASIC METABOLIC PANEL
BUN: 8 mg/dL (ref 6–23)
CALCIUM: 9.5 mg/dL (ref 8.4–10.5)
CO2: 25 meq/L (ref 19–32)
CREATININE: 0.77 mg/dL (ref 0.40–1.20)
Chloride: 106 mEq/L (ref 96–112)
GFR: 105.95 mL/min (ref >60.00)
GLUCOSE: 103 mg/dL — AB (ref 70–99)
Potassium: 4 mEq/L (ref 3.5–5.1)
SODIUM: 139 meq/L (ref 135–145)

## 2015-07-29 LAB — MICROALBUMIN / CREATININE URINE RATIO
CREATININE, U: 90.5 mg/dL
MICROALB/CREAT RATIO: 0.8 mg/g (ref 0.0–30.0)

## 2015-07-29 LAB — HEMOGLOBIN A1C: Hgb A1c MFr Bld: 5.9 % (ref 4.6–6.5)

## 2015-07-29 LAB — TSH: TSH: 1.03 u[IU]/mL (ref 0.35–4.50)

## 2015-07-29 NOTE — Progress Notes (Signed)
Pre visit review using our clinic review tool, if applicable. No additional management support is needed unless otherwise documented below in the visit note. 

## 2015-07-29 NOTE — Patient Instructions (Signed)
  Your next office appointment will be determined based upon review of your pending labs  and  xrays  Those written interpretation of the lab results and instructions will be transmitted to you by My Chart  Critical results will be called.  Followup as needed for any active or acute issue. Please report any significant change in your symptoms.   The following nutritional changes may help prevent Diabetes progression & complications.  White carbohydrates (potatoes, rice, bread, and pasta) cause a high spike of the sugar level which stays elevated for a significant period of time (called sugar"load").  For example a  baked potato has a cup of sugar and a  french fry  2 teaspoons of sugar.  More complex carbs such as yams, wild  rice, whole grained bread &  wheat pasta have been much lower spike and persistent load of sugar than the white carbs. The pancreas excretes excess insulin in response to the high spike & load of sugar . Over time the pancreas can actually run out of insulin necessitating insulin shots.  Consume less than 30 Grams (preferably ZERO) of sugar per day from foods & drinks with High Fructose Corn Syrup (HFCS) sugar as #1,2,3 or # 4 on label.[employer], [employer] do not carry products with HFCS.

## 2015-07-29 NOTE — Progress Notes (Signed)
   Subjective:    Patient ID: Nicole Melendez, female    DOB: 21-Nov-1975, 39 y.o.   MRN: [id number]  HPI She has been out of insulin since February this year except for an outdated insulin pen.. She had been on Januvia until 2013 and is unsure why it was stopped. She typically administers short-acting insulin if her glucoses are over 150. If it's over 150 she will check her sugars up to 10 times a day. Her glucometer is over 85 years old.  9/30 her fasting blood sugar was 319 ;she took 12 units of the outdated insulin. That evening  sugar was 360 and she took another 12 units from her outdated pen.  On 9/30 she described blurred vision. She states she knows sugars are up as she "smells sweet to herself". 9/30-10/2 she had cold sweats, shakes, and felt jittery. 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  She has had polydipsia for 1 month. She had numbness of the left big toe for 3 months  She weighed 260 pounds in April of this year but has gained to 323 pounds as she could not exercise due to a hairline pelvic fracture. She had sustained a fracture when she fell due to dizziness caused by one of her medications. She is no longer on that medication and can not remember the name.   She has started walking 3 hours 4 times a week. She has no associated cardiopulmonary symptoms with this  Review of the chart reveals that her last A1c was 5.8% on 01/22/15. In December 2015 it was 5.7. Renal function has been normal.   Review of Systems  Chest pain, palpitations, tachycardia, exertional dyspnea, paroxysmal nocturnal dyspnea, claudication or edema are absent.  She does not have polyphagia or polyuria..   She also denies any nonhealing skin lesions.  She has no postural hypotension.      Objective:   Physical Exam  Pertinent or positive findings include:BMI 50.59. Pes planus present. Slightly deformed toenails.  General appearance :adequately nourished; in no  distress.  Eyes: No conjunctival inflammation or scleral icterus is present.  Oral exam:  Lips and gums are healthy appearing.There is no oropharyngeal erythema or exudate noted. Dental hygiene is good.  Heart:  Normal rate and regular rhythm. S1 and S2 normal without gallop, murmur, click, rub or other extra sounds    Lungs:Chest clear to auscultation; no wheezes, rhonchi,rales ,or rubs present.No increased work of breathing.   Abdomen: bowel sounds normal, soft and non-tender without masses, organomegaly or hernias noted.  No guarding or rebound. No flank tenderness to percussion.  Vascular : all pulses equal ; no bruits present.  Skin:Warm & dry.  Intact without suspicious lesions or rashes ; no tenting or jaundice   Lymphatic: No lymphadenopathy is noted about the head, neck, axilla, or inguinal areas.   Neuro: Strength, tone & DTRs normal.      Assessment & Plan:  #1 diabetes, questionable status. Rule out uncontrolled diabetes versus insulin induced hypoglycemia. The veracity of her glucometer will need to be documented  #2 morbid obesity. Nutritional interventions discussed  Plan: See orders and recommendations

## 2015-07-30 ENCOUNTER — Encounter: Payer: Self-pay | Admitting: Internal Medicine

## 2015-07-30 ENCOUNTER — Other Ambulatory Visit: Payer: Self-pay | Admitting: Internal Medicine

## 2015-07-30 DIAGNOSIS — Z794 Long term (current) use of insulin: Principal | ICD-10-CM

## 2015-07-30 DIAGNOSIS — E1140 Type 2 diabetes mellitus with diabetic neuropathy, unspecified: Secondary | ICD-10-CM

## 2015-08-02 ENCOUNTER — Encounter (HOSPITAL_COMMUNITY): Payer: Self-pay

## 2015-08-02 ENCOUNTER — Emergency Department (INDEPENDENT_AMBULATORY_CARE_PROVIDER_SITE_OTHER)
Admission: EM | Admit: 2015-08-02 | Discharge: 2015-08-02 | Disposition: A | Discharge status: Nursing Home (Includes ICF) | Payer: BLUE CROSS/BLUE SHIELD | Source: Home / Self Care

## 2015-08-02 ENCOUNTER — Encounter (HOSPITAL_COMMUNITY): Payer: Self-pay | Admitting: Emergency Medicine

## 2015-08-02 ENCOUNTER — Emergency Department (HOSPITAL_COMMUNITY)
Admission: EM | Admit: 2015-08-02 | Discharge: 2015-08-02 | Disposition: A | Discharge status: Home / Self Care | Payer: BLUE CROSS/BLUE SHIELD | Attending: Emergency Medicine | Admitting: Emergency Medicine

## 2015-08-02 DIAGNOSIS — Z8711 Personal history of peptic ulcer disease: Secondary | ICD-10-CM | POA: Insufficient documentation

## 2015-08-02 DIAGNOSIS — K219 Gastro-esophageal reflux disease without esophagitis: Secondary | ICD-10-CM | POA: Insufficient documentation

## 2015-08-02 DIAGNOSIS — D649 Anemia, unspecified: Secondary | ICD-10-CM | POA: Diagnosis not present

## 2015-08-02 DIAGNOSIS — E119 Type 2 diabetes mellitus without complications: Secondary | ICD-10-CM | POA: Diagnosis not present

## 2015-08-02 DIAGNOSIS — I10 Essential (primary) hypertension: Secondary | ICD-10-CM | POA: Diagnosis not present

## 2015-08-02 DIAGNOSIS — Z794 Long term (current) use of insulin: Secondary | ICD-10-CM | POA: Diagnosis not present

## 2015-08-02 DIAGNOSIS — Z880 Allergy status to penicillin: Secondary | ICD-10-CM | POA: Diagnosis not present

## 2015-08-02 DIAGNOSIS — F329 Major depressive disorder, single episode, unspecified: Secondary | ICD-10-CM | POA: Insufficient documentation

## 2015-08-02 DIAGNOSIS — F419 Anxiety disorder, unspecified: Secondary | ICD-10-CM | POA: Insufficient documentation

## 2015-08-02 DIAGNOSIS — Z791 Long term (current) use of non-steroidal anti-inflammatories (NSAID): Secondary | ICD-10-CM | POA: Diagnosis not present

## 2015-08-02 DIAGNOSIS — Z8742 Personal history of other diseases of the female genital tract: Secondary | ICD-10-CM | POA: Diagnosis not present

## 2015-08-02 DIAGNOSIS — Z79899 Other long term (current) drug therapy: Secondary | ICD-10-CM | POA: Insufficient documentation

## 2015-08-02 DIAGNOSIS — Z7951 Long term (current) use of inhaled steroids: Secondary | ICD-10-CM | POA: Diagnosis not present

## 2015-08-02 DIAGNOSIS — E669 Obesity, unspecified: Secondary | ICD-10-CM | POA: Insufficient documentation

## 2015-08-02 DIAGNOSIS — Z8701 Personal history of pneumonia (recurrent): Secondary | ICD-10-CM | POA: Diagnosis not present

## 2015-08-02 DIAGNOSIS — Z87891 Personal history of nicotine dependence: Secondary | ICD-10-CM | POA: Insufficient documentation

## 2015-08-02 DIAGNOSIS — E785 Hyperlipidemia, unspecified: Secondary | ICD-10-CM | POA: Diagnosis not present

## 2015-08-02 DIAGNOSIS — G43909 Migraine, unspecified, not intractable, without status migrainosus: Secondary | ICD-10-CM | POA: Insufficient documentation

## 2015-08-02 DIAGNOSIS — H539 Unspecified visual disturbance: Secondary | ICD-10-CM

## 2015-08-02 DIAGNOSIS — R51 Headache: Secondary | ICD-10-CM | POA: Diagnosis present

## 2015-08-02 DIAGNOSIS — R519 Headache, unspecified: Secondary | ICD-10-CM

## 2015-08-02 LAB — BASIC METABOLIC PANEL
ANION GAP: 6 (ref 5–15)
BUN: 8 mg/dL (ref 6–20)
CALCIUM: 8.9 mg/dL (ref 8.9–10.3)
CO2: 27 mmol/L (ref 22–32)
Chloride: 103 mmol/L (ref 101–111)
Creatinine, Ser: 0.78 mg/dL (ref 0.44–1.00)
GLUCOSE: 105 mg/dL — AB (ref 65–99)
Potassium: 4.2 mmol/L (ref 3.5–5.1)
Sodium: 136 mmol/L (ref 135–145)

## 2015-08-02 LAB — URINALYSIS, ROUTINE W REFLEX MICROSCOPIC
BILIRUBIN URINE: NEGATIVE
Glucose, UA: NEGATIVE mg/dL
KETONES UR: NEGATIVE mg/dL
Leukocytes, UA: NEGATIVE
NITRITE: NEGATIVE
PH: 5.5 (ref 5.0–8.0)
Protein, ur: NEGATIVE mg/dL
Specific Gravity, Urine: 1.017 (ref 1.005–1.030)
UROBILINOGEN UA: 0.2 mg/dL (ref 0.0–1.0)

## 2015-08-02 LAB — CBC
HEMATOCRIT: 37.3 % (ref 36.0–46.0)
HEMOGLOBIN: 12.6 g/dL (ref 12.0–15.0)
MCH: 27.8 pg (ref 26.0–34.0)
MCHC: 33.8 g/dL (ref 30.0–36.0)
MCV: 82.3 fL (ref 78.0–100.0)
Platelets: 267 10*3/uL (ref 150–400)
RBC: 4.53 MIL/uL (ref 3.87–5.11)
RDW: 15 % (ref 11.5–15.5)
WBC: 8.5 10*3/uL (ref 4.0–10.5)

## 2015-08-02 LAB — URINE MICROSCOPIC-ADD ON

## 2015-08-02 MED ORDER — KETOROLAC TROMETHAMINE 30 MG/ML IJ SOLN
INTRAMUSCULAR | Status: AC
  Filled 2015-08-02: qty 1

## 2015-08-02 MED ORDER — KETOROLAC TROMETHAMINE 30 MG/ML IJ SOLN
30.0000 mg | Freq: Once | INTRAMUSCULAR | Status: AC
  Administered 2015-08-02: 30 mg via INTRAVENOUS
  Filled 2015-08-02: qty 1

## 2015-08-02 MED ORDER — SODIUM CHLORIDE 0.9 % IV BOLUS (SEPSIS)
1000.0000 mL | Freq: Once | INTRAVENOUS | Status: AC
  Administered 2015-08-02: 1000 mL via INTRAVENOUS

## 2015-08-02 MED ORDER — DIPHENHYDRAMINE HCL 50 MG/ML IJ SOLN
25.0000 mg | Freq: Once | INTRAMUSCULAR | Status: AC
  Administered 2015-08-02: 25 mg via INTRAVENOUS
  Filled 2015-08-02: qty 1

## 2015-08-02 MED ORDER — PROCHLORPERAZINE EDISYLATE 5 MG/ML IJ SOLN
5.0000 mg | Freq: Once | INTRAMUSCULAR | Status: AC
  Administered 2015-08-02: 5 mg via INTRAVENOUS
  Filled 2015-08-02: qty 2

## 2015-08-02 NOTE — ED Notes (Addendum)
Pt reports headache and blurry vision since Tuesday. She went to PCP on Tuesday and Thursday and was told her blood pressure has been elevated. Hx of migraines and this does not feel similar. She went to [facility] today and sent here for CT scan.

## 2015-08-02 NOTE — ED Provider Notes (Signed)
CSN: [id number]     Arrival date & time 08/02/15  1855 History   First MD Initiated Contact with Patient 08/02/15 1940     Chief Complaint  Patient presents with  . Headache     (Consider location/radiation/quality/duration/timing/severity/associated sxs/prior Treatment) HPI This is a 39 year old morbidly obese female with past medical history of diabetes, chronic migraines who presents emergency Department with chief complaint of headache. Patient was seen at the urgent care earlier today here for further evaluation. Patient states that she normally gets migraines. This one is slightly different from her normal migraine. She states that normally it occurs in a single area. However, this feels the entire right side of her head. She also states that normally she gets an aura where she smells certain smells. She did not have that with the onset of this headache. She has some blurry vision which normal with her migraines.  Denies photophobia, phonophobia, UL throbbing, N/V,  stiff neck, neck pain, rash, or "thunderclap" onset.    Past Medical History  Diagnosis Date  . Morbid obesity ([place])   . Peptic ulcer disease   . GERD (gastroesophageal reflux disease)   . Migraine   . Anxiety   . Anemia   . Hyperlipidemia   . Menorrhagia   . PNA (pneumonia) july 2011  . Diabetes mellitus type II     steroid related  . Depression   . Sarcoid ([place])     including hand per rheumatology-Dr. Amil Amen  . Allergic rhinitis, cause unspecified   . Varicose veins with pain   . Positive ANA (antinuclear antibody) 02/14/2012  . Shortness of breath     on exertion  . History of blood transfusion   . Sarcoidosis of lung ([place])   . Otitis media 06/28/2015   Past Surgical History  Procedure Laterality Date  . Uterine ablation  03/2010  . Wisdom tooth extraction    . Abdominal hysterectomy     Family History  Problem Relation Age of Onset  . Diabetes    . Hypertension    . Allergies Mother   . Heart  attack Mother   . Heart disease Father   . Rheum arthritis Father   . Ovarian cancer Maternal Aunt   . Lung cancer Maternal Aunt   . Breast cancer Maternal Aunt   . Bone cancer Maternal Aunt   . Other Neg Hx    Social History  Substance Use Topics  . Smoking status: Former Smoker -- 1.00 packs/day for 20 years    Types: Cigarettes    Quit date: 10/25/2012  . Smokeless tobacco: None     Comment: QUIT 04/2010 AND STARTED BACK 2014 X 3 MONTHS. less than 1 ppd.  started at age 50.    Marland Kitchen Alcohol Use: No   OB History    No data available     Review of Systems  Ten systems reviewed and are negative for acute change, except as noted in the HPI.    Allergies  Azithromycin; Oxycodone-acetaminophen; Penicillins; and Klonopin  Home Medications   Prior to Admission medications   Medication Sig Start Date End Date Taking? Authorizing Provider  acetaminophen (TYLENOL) 500 MG tablet Take 1,000 mg by mouth every 6 (six) hours as needed. For pain    Historical Provider, MD  ALPRAZolam Duanne Moron) 0.5 MG tablet TAKE 1 TABLET BY MOUTH THREE TIMES DAILY AS NEEDED 04/23/15   Biagio Borg, MD  B-D UF III MINI PEN NEEDLES 31G X 5 MM MISC USE  FOUR TIMES DAILY FOR SLIDING SCALE AS NEEDED    Biagio Borg, MD  budesonide-formoterol [facility]) 160-4.5 MCG/ACT inhaler Inhale 2 puffs into the lungs 2 (two) times daily. 10/17/14   Biagio Borg, MD  cetirizine (ZYRTEC) 10 MG tablet TAKE 1 TABLET BY MOUTH EVERY DAY 03/18/15   Biagio Borg, MD  cholecalciferol (VITAMIN D) 1000 UNITS tablet Take 5,000 Units by mouth daily.    Historical Provider, MD  cyclobenzaprine (FLEXERIL) 5 MG tablet Take 1 tablet (5 mg total) by mouth at bedtime as needed for muscle spasms. 10/17/14   Biagio Borg, MD  folic acid (FOLVITE) 1 MG tablet Take 1 tablet (1 mg total) by mouth daily. 07/02/15   Tammy S Parrett, NP  glucose blood (ONE TOUCH ULTRA TEST) test strip 1 each by Other route 4 (four) times daily. Use to check blood sugars four  times a day Dx E11.9 07/22/15   Biagio Borg, MD  insulin lispro (HUMALOG) 100 UNIT/ML injection Inject 0.12 mLs (12 Units total) into the skin 3 (three) times daily with meals. Up to 12 units tid with meals, sliding scale use as directed 11/22/14   Biagio Borg, MD  levalbuterol [facility] HFA) 45 MCG/ACT inhaler Inhale 1-2 puffs into the lungs 2 (two) times daily. 10/17/14   Biagio Borg, MD  levofloxacin (LEVAQUIN) 500 MG tablet Take 1 tablet (500 mg total) by mouth daily. 02/21/15   Biagio Borg, MD  methotrexate (RHEUMATREX) 2.5 MG tablet Take as directed    Historical Provider, MD  naproxen (NAPROSYN) 500 MG tablet TAKE 1 TABLET BY MOUTH TWICE DAILY WITH A MEAL 02/12/15   Biagio Borg, MD  nystatin (MYCOSTATIN) 100000 UNIT/ML suspension Take 5 mLs (500,000 Units total) by mouth 4 (four) times daily. 06/24/15   Biagio Borg, MD  omeprazole (PRILOSEC) 20 MG capsule Take 1 capsule (20 mg total) by mouth daily. 02/05/15   Hannah Muthersbaugh, PA-C  ondansetron (ZOFRAN) 4 MG tablet Take 1 tablet (4 mg total) by mouth every 8 (eight) hours as needed for nausea or vomiting. 06/10/15   Biagio Borg, MD  predniSONE (DELTASONE) 10 MG tablet Take 4 for three days 3 for three days 2 for three days 1 for three days and then resume 5mg  prednisone daily 06/21/15   Elsie Stain, MD  predniSONE (DELTASONE) 5 MG tablet Take 1 tablet (5 mg total) by mouth daily with breakfast. When current prednisone pulse of 10mg  dosing ends 06/21/15   Elsie Stain, MD  sertraline (ZOLOFT) 100 MG tablet 2 tabs by mouth per day Patient taking differently: Take 200 mg by mouth daily.  10/17/14   Biagio Borg, MD  traMADol (ULTRAM) 50 MG tablet Take 50 mg by mouth every 6 (six) hours as needed.  02/21/15   Historical Provider, MD  vitamin B-12 (CYANOCOBALAMIN) 1000 MCG tablet Take 1,000 mcg by mouth daily.    Historical Provider, MD   BP 146/68 mmHg  Pulse 76  Temp(Src) 99.1 F (37.3 C) (Oral)  Resp 12  SpO2 100%  LMP  07/17/2012 Physical Exam  Constitutional: She is oriented to person, place, and time. She appears well-developed and well-nourished. No distress.  HENT:  Head: Normocephalic and atraumatic.  Mouth/Throat: Oropharynx is clear and moist.  Eyes: Conjunctivae and EOM are normal. Pupils are equal, round, and reactive to light. No scleral icterus.  No horizontal, vertical or rotational nystagmus  Neck: Normal range of motion. Neck supple.  Full  active and passive ROM without pain No midline or paraspinal tenderness No nuchal rigidity or meningeal signs  Cardiovascular: Normal rate, regular rhythm and intact distal pulses.   Pulmonary/Chest: Effort normal and breath sounds normal. No respiratory distress. She has no wheezes. She has no rales.  Abdominal: Soft. Bowel sounds are normal. There is no tenderness. There is no rebound and no guarding.  Musculoskeletal: Normal range of motion.  Lymphadenopathy:    She has no cervical adenopathy.  Neurological: She is alert and oriented to person, place, and time. She has normal reflexes. No cranial nerve deficit. She exhibits normal muscle tone. Coordination normal.  Mental Status:  Alert, oriented, thought content appropriate. Speech fluent without evidence of aphasia. Able to follow 2 step commands without difficulty.  Cranial Nerves:  II:  Peripheral visual fields grossly normal, pupils equal, round, reactive to light III,IV, VI: ptosis not present, extra-ocular motions intact bilaterally  V,VII: smile symmetric, facial light touch sensation equal VIII: hearing grossly normal bilaterally  IX,X: midline uvula rise  XI: bilateral shoulder shrug equal and strong XII: midline tongue extension  Motor:  5/5 in upper and lower extremities bilaterally including strong and equal grip strength and dorsiflexion/plantar flexion Sensory: Pinprick and light touch normal in all extremities.  Deep Tendon Reflexes: 2+ and symmetric  Cerebellar: normal  finger-to-nose with bilateral upper extremities Gait: normal gait and balance CV: distal pulses palpable throughout   Skin: Skin is warm and dry. No rash noted. She is not diaphoretic.  Psychiatric: She has a normal mood and affect. Her behavior is normal. Judgment and thought content normal.  Nursing note and vitals reviewed.   ED Course  Procedures (including critical care time) Labs Review Labs Reviewed - No data to display  Imaging Review No results found. I have personally reviewed and evaluated these images and lab results as part of my medical decision-making.   EKG Interpretation None      MDM   Final diagnoses:  Bad headache  Essential hypertension   No imaging necessary Pt HA treated and improved while in ED.  Presentation is like pts typical HA and non concerning for [facility], ICH, Meningitis, or temporal arteritis. Pt is afebrile with no focal neuro deficits, nuchal rigidity, or change in vision. Pt is to follow up with PCP to discuss prophylactic medication. Pt verbalizes understanding and is agreeable with plan to dc.      Margarita Mail, PA-C 08/04/15 [place], MD 08/05/15 1535

## 2015-08-02 NOTE — ED Notes (Signed)
The patient presented to the [facility] with a complaint of blurry vision and a headache that has been going on for a week. The patient stated that she has been to her PCP and optometrist.

## 2015-08-02 NOTE — Discharge Instructions (Signed)
You are having a headache. No specific cause was found today for your headache. It may have been a migraine or other cause of headache. Stress, anxiety, fatigue, and depression are common triggers for headaches. Your headache today does not appear to be life-threatening or require hospitalization, but often the exact cause of headaches is not determined in the emergency department. Therefore, follow-up with your doctor is very important to find out what may have caused your headache, and whether or not you need any further diagnostic testing or treatment. Sometimes headaches can appear benign (not harmful), but then more serious symptoms can develop which should prompt an immediate re-evaluation by your doctor or the emergency department. SEEK MEDICAL ATTENTION IF: You develop possible problems with medications prescribed.  The medications don't resolve your headache, if it recurs , or if you have multiple episodes of vomiting or can't take fluids. You have a change from the usual headache. RETURN IMMEDIATELY IF you develop a sudden, severe headache or confusion, become poorly responsive or faint, develop a fever above 100.35F or problem breathing, have a change in speech, vision, swallowing, or understanding, or develop new weakness, numbness, tingling, incoordination, or have a seizure.   Migraine Headache A migraine headache is an intense, throbbing pain on one or both sides of your head. A migraine can last for 30 minutes to several hours. CAUSES  The exact cause of a migraine headache is not always known. However, a migraine may be caused when nerves in the brain become irritated and release chemicals that cause inflammation. This causes pain. Certain things may also trigger migraines, such as:  Alcohol.  Smoking.  Stress.  Menstruation.  Aged cheeses.  Foods or drinks that contain nitrates, glutamate, aspartame, or tyramine.  Lack of  sleep.  Chocolate.  Caffeine.  Hunger.  Physical exertion.  Fatigue.  Medicines used to treat chest pain (nitroglycerine), birth control pills, estrogen, and some blood pressure medicines. SIGNS AND SYMPTOMS  Pain on one or both sides of your head.  Pulsating or throbbing pain.  Severe pain that prevents daily activities.  Pain that is aggravated by any physical activity.  Nausea, vomiting, or both.  Dizziness.  Pain with exposure to bright lights, loud noises, or activity.  General sensitivity to bright lights, loud noises, or smells. Before you get a migraine, you may get warning signs that a migraine is coming (aura). An aura may include:  Seeing flashing lights.  Seeing bright spots, halos, or zigzag lines.  Having tunnel vision or blurred vision.  Having feelings of numbness or tingling.  Having trouble talking.  Having muscle weakness. DIAGNOSIS  A migraine headache is often diagnosed based on:  Symptoms.  Physical exam.  A CT scan or MRI of your head. These imaging tests cannot diagnose migraines, but they can help rule out other causes of headaches. TREATMENT Medicines may be given for pain and nausea. Medicines can also be given to help prevent recurrent migraines.  HOME CARE INSTRUCTIONS  Only take over-the-counter or prescription medicines for pain or discomfort as directed by your health care provider. The use of long-term narcotics is not recommended.  Lie down in a dark, quiet room when you have a migraine.  Keep a journal to find out what may trigger your migraine headaches. For example, write down:  What you eat and drink.  How much sleep you get.  Any change to your diet or medicines.  Limit alcohol consumption.  Quit smoking if you smoke.  Get 7-9 hours  of sleep, or as recommended by your health care provider.  Limit stress.  Keep lights dim if bright lights bother you and make your migraines worse. SEEK IMMEDIATE MEDICAL  CARE IF:   Your migraine becomes severe.  You have a fever.  You have a stiff neck.  You have vision loss.  You have muscular weakness or loss of muscle control.  You start losing your balance or have trouble walking.  You feel faint or pass out.  You have severe symptoms that are different from your first symptoms. MAKE SURE YOU:   Understand these instructions.  Will watch your condition.  Will get help right away if you are not doing well or get worse.   This information is not intended to replace advice given to you by your health care provider. Make sure you discuss any questions you have with your health care provider.   Document Released: 10/11/2005 Document Revised: 11/01/2014 Document Reviewed: 06/18/2013 Elsevier Interactive Patient Education 2016 [employer].  Hypertension Hypertension, commonly called high blood pressure, is when the force of blood pumping through your arteries is too strong. Your arteries are the blood vessels that carry blood from your heart throughout your body. A blood pressure reading consists of a higher number over a lower number, such as 110/72. The higher number (systolic) is the pressure inside your arteries when your heart pumps. The lower number (diastolic) is the pressure inside your arteries when your heart relaxes. Ideally you want your blood pressure below 120/80. Hypertension forces your heart to work harder to pump blood. Your arteries may become narrow or stiff. Having untreated or uncontrolled hypertension can cause heart attack, stroke, kidney disease, and other problems. RISK FACTORS Some risk factors for high blood pressure are controllable. Others are not.  Risk factors you cannot control include:   Race. You may be at higher risk if you are African American.  Age. Risk increases with age.  Gender. Men are at higher risk than women before age 61 years. After age 57, women are at higher risk than men. Risk factors you can  control include:  Not getting enough exercise or physical activity.  Being overweight.  Getting too much fat, sugar, calories, or salt in your diet.  Drinking too much alcohol. SIGNS AND SYMPTOMS Hypertension does not usually cause signs or symptoms. Extremely high blood pressure (hypertensive crisis) may cause headache, anxiety, shortness of breath, and nosebleed. DIAGNOSIS To check if you have hypertension, your health care provider will measure your blood pressure while you are seated, with your arm held at the level of your heart. It should be measured at least twice using the same arm. Certain conditions can cause a difference in blood pressure between your right and left arms. A blood pressure reading that is higher than normal on one occasion does not mean that you need treatment. If it is not clear whether you have high blood pressure, you may be asked to return on a different day to have your blood pressure checked again. Or, you may be asked to monitor your blood pressure at home for 1 or more weeks. TREATMENT Treating high blood pressure includes making lifestyle changes and possibly taking medicine. Living a healthy lifestyle can help lower high blood pressure. You may need to change some of your habits. Lifestyle changes may include:  Following the DASH diet. This diet is high in fruits, vegetables, and whole grains. It is low in salt, red meat, and added sugars.  Keep your  sodium intake below 2,300 mg per day.  Getting at least 30-45 minutes of aerobic exercise at least 4 times per week.  Losing weight if necessary.  Not smoking.  Limiting alcoholic beverages.  Learning ways to reduce stress. Your health care provider may prescribe medicine if lifestyle changes are not enough to get your blood pressure under control, and if one of the following is true:  You are 88-46 years of age and your systolic blood pressure is above 140.  You are 11 years of age or older, and  your systolic blood pressure is above 150.  Your diastolic blood pressure is above 90.  You have diabetes, and your systolic blood pressure is over 638 or your diastolic blood pressure is over 90.  You have kidney disease and your blood pressure is above 140/90.  You have heart disease and your blood pressure is above 140/90. Your personal target blood pressure may vary depending on your medical conditions, your age, and other factors. HOME CARE INSTRUCTIONS  Have your blood pressure rechecked as directed by your health care provider.   Take medicines only as directed by your health care provider. Follow the directions carefully. Blood pressure medicines must be taken as prescribed. The medicine does not work as well when you skip doses. Skipping doses also puts you at risk for problems.  Do not smoke.   Monitor your blood pressure at home as directed by your health care provider. SEEK MEDICAL CARE IF:   You think you are having a reaction to medicines taken.  You have recurrent headaches or feel dizzy.  You have swelling in your ankles.  You have trouble with your vision. SEEK IMMEDIATE MEDICAL CARE IF:  You develop a severe headache or confusion.  You have unusual weakness, numbness, or feel faint.  You have severe chest or abdominal pain.  You vomit repeatedly.  You have trouble breathing. MAKE SURE YOU:   Understand these instructions.  Will watch your condition.  Will get help right away if you are not doing well or get worse.   This information is not intended to replace advice given to you by your health care provider. Make sure you discuss any questions you have with your health care provider.   Document Released: 10/11/2005 Document Revised: 02/25/2015 Document Reviewed: 08/03/2013 Elsevier Interactive Patient Education [employer].

## 2015-08-02 NOTE — ED Notes (Signed)
Gave pt sandwich and Sprite, per [place]

## 2015-08-02 NOTE — ED Provider Notes (Signed)
CSN: [id number]     Arrival date & time 08/02/15  1752 History   None    Chief Complaint  Patient presents with  . Visual Field Change  . Hypertension  . Headache   (Consider location/radiation/quality/duration/timing/severity/associated sxs/prior Treatment) Patient is a 39 y.o. female presenting with hypertension and headaches. The history is provided by the patient.  Hypertension This is a new problem. The current episode started more than 2 days ago. The problem occurs constantly. The problem has been gradually worsening. Associated symptoms include headaches. Nothing aggravates the symptoms. Nothing relieves the symptoms. She has tried nothing for the symptoms.  Headache   Past Medical History  Diagnosis Date  . Morbid obesity ([place])   . Peptic ulcer disease   . GERD (gastroesophageal reflux disease)   . Migraine   . Anxiety   . Anemia   . Hyperlipidemia   . Menorrhagia   . PNA (pneumonia) july 2011  . Diabetes mellitus type II     steroid related  . Depression   . Sarcoid ([place])     including hand per rheumatology-Dr. Amil Amen  . Allergic rhinitis, cause unspecified   . Varicose veins with pain   . Positive ANA (antinuclear antibody) 02/14/2012  . Shortness of breath     on exertion  . History of blood transfusion   . Sarcoidosis of lung ([place])   . Otitis media 06/28/2015   Past Surgical History  Procedure Laterality Date  . Uterine ablation  03/2010  . Wisdom tooth extraction    . Abdominal hysterectomy     Family History  Problem Relation Age of Onset  . Diabetes    . Hypertension    . Allergies Mother   . Heart attack Mother   . Heart disease Father   . Rheum arthritis Father   . Ovarian cancer Maternal Aunt   . Lung cancer Maternal Aunt   . Breast cancer Maternal Aunt   . Bone cancer Maternal Aunt   . Other Neg Hx    Social History  Substance Use Topics  . Smoking status: Former Smoker -- 1.00 packs/day for 20 years    Types: Cigarettes    Quit date:  10/25/2012  . Smokeless tobacco: None     Comment: QUIT 04/2010 AND STARTED BACK 2014 X 3 MONTHS. less than 1 ppd.  started at age 35.    Marland Kitchen Alcohol Use: No   OB History    No data available     Review of Systems  Constitutional: Negative.   Eyes: Positive for visual disturbance.  Cardiovascular: Negative.   Gastrointestinal: Negative.   Endocrine: Negative.   Genitourinary: Negative.   Allergic/Immunologic: Negative.   Neurological: Positive for headaches.  Hematological: Negative.   Psychiatric/Behavioral: Negative.     Allergies  Azithromycin; Oxycodone-acetaminophen; Penicillins; and Klonopin  Home Medications   Prior to Admission medications   Medication Sig Start Date End Date Taking? Authorizing Provider  acetaminophen (TYLENOL) 500 MG tablet Take 1,000 mg by mouth every 6 (six) hours as needed. For pain    Historical Provider, MD  ALPRAZolam Duanne Moron) 0.5 MG tablet TAKE 1 TABLET BY MOUTH THREE TIMES DAILY AS NEEDED 04/23/15   Biagio Borg, MD  B-D UF III MINI PEN NEEDLES 31G X 5 MM MISC USE FOUR TIMES DAILY FOR SLIDING SCALE AS NEEDED    Biagio Borg, MD  budesonide-formoterol [facility]) 160-4.5 MCG/ACT inhaler Inhale 2 puffs into the lungs 2 (two) times daily. 10/17/14   Hunt Oris  Jenny Reichmann, MD  cetirizine (ZYRTEC) 10 MG tablet TAKE 1 TABLET BY MOUTH EVERY DAY 03/18/15   Biagio Borg, MD  cholecalciferol (VITAMIN D) 1000 UNITS tablet Take 5,000 Units by mouth daily.    Historical Provider, MD  cyclobenzaprine (FLEXERIL) 5 MG tablet Take 1 tablet (5 mg total) by mouth at bedtime as needed for muscle spasms. 10/17/14   Biagio Borg, MD  folic acid (FOLVITE) 1 MG tablet Take 1 tablet (1 mg total) by mouth daily. 07/02/15   Tammy S Parrett, NP  glucose blood (ONE TOUCH ULTRA TEST) test strip 1 each by Other route 4 (four) times daily. Use to check blood sugars four times a day Dx E11.9 07/22/15   Biagio Borg, MD  insulin lispro (HUMALOG) 100 UNIT/ML injection Inject 0.12 mLs (12 Units  total) into the skin 3 (three) times daily with meals. Up to 12 units tid with meals, sliding scale use as directed 11/22/14   Biagio Borg, MD  levalbuterol [facility] HFA) 45 MCG/ACT inhaler Inhale 1-2 puffs into the lungs 2 (two) times daily. 10/17/14   Biagio Borg, MD  levofloxacin (LEVAQUIN) 500 MG tablet Take 1 tablet (500 mg total) by mouth daily. 02/21/15   Biagio Borg, MD  methotrexate (RHEUMATREX) 2.5 MG tablet Take as directed    Historical Provider, MD  naproxen (NAPROSYN) 500 MG tablet TAKE 1 TABLET BY MOUTH TWICE DAILY WITH A MEAL 02/12/15   Biagio Borg, MD  nystatin (MYCOSTATIN) 100000 UNIT/ML suspension Take 5 mLs (500,000 Units total) by mouth 4 (four) times daily. 06/24/15   Biagio Borg, MD  omeprazole (PRILOSEC) 20 MG capsule Take 1 capsule (20 mg total) by mouth daily. 02/05/15   Hannah Muthersbaugh, PA-C  ondansetron (ZOFRAN) 4 MG tablet Take 1 tablet (4 mg total) by mouth every 8 (eight) hours as needed for nausea or vomiting. 06/10/15   Biagio Borg, MD  predniSONE (DELTASONE) 10 MG tablet Take 4 for three days 3 for three days 2 for three days 1 for three days and then resume 5mg  prednisone daily 06/21/15   Elsie Stain, MD  predniSONE (DELTASONE) 5 MG tablet Take 1 tablet (5 mg total) by mouth daily with breakfast. When current prednisone pulse of 10mg  dosing ends 06/21/15   Elsie Stain, MD  sertraline (ZOLOFT) 100 MG tablet 2 tabs by mouth per day Patient taking differently: Take 200 mg by mouth daily.  10/17/14   Biagio Borg, MD  traMADol (ULTRAM) 50 MG tablet Take 50 mg by mouth every 6 (six) hours as needed.  02/21/15   Historical Provider, MD  vitamin B-12 (CYANOCOBALAMIN) 1000 MCG tablet Take 1,000 mcg by mouth daily.    Historical Provider, MD   Meds Ordered and Administered this Visit  Medications - No data to display  BP 159/117 mmHg  Pulse 84  Temp(Src) 98.9 F (37.2 C) (Oral)  Resp 20  SpO2 99%  LMP 07/17/2012 No data found.   Physical Exam   Constitutional: She appears well-developed and well-nourished.  Obese AA female in NAD  HENT:  Head: Normocephalic and atraumatic.  Right Ear: External ear normal.  Left Ear: External ear normal.  Mouth/Throat: Oropharynx is clear and moist.  Eyes: Conjunctivae and EOM are normal. Pupils are equal, round, and reactive to light.  Neck: Normal range of motion. Neck supple.  Cardiovascular: Normal rate, regular rhythm and normal heart sounds.   Pulmonary/Chest: Effort normal and breath sounds normal.  Abdominal:  Soft. Bowel sounds are normal.    ED Course  Procedures (including critical care time)  Labs Review Labs Reviewed - No data to display  Imaging Review No results found.   Visual Acuity Review  Right Eye Distance: 20/100 Left Eye Distance: 20/30 Bilateral Distance: 20/50  Right Eye Near:   Left Eye Near:    Bilateral Near:         MDM   1. Vision changes   2. Essential hypertension    Sent to ED for evaluation of Vision changes, headaches, negative Optometry evaluation yesterday. Headache is worse headache she has ever experienced and is not like the migraines she has had in the past.  Consider Neuro Evaluation.  Louise, FNP 08/02/15 [place], FNP 08/02/15 [phone]

## 2015-08-04 ENCOUNTER — Encounter: Payer: Self-pay | Admitting: Internal Medicine

## 2015-08-07 ENCOUNTER — Encounter: Payer: Self-pay | Admitting: Endocrinology

## 2015-08-07 ENCOUNTER — Encounter: Payer: Self-pay | Admitting: Internal Medicine

## 2015-08-07 ENCOUNTER — Ambulatory Visit (INDEPENDENT_AMBULATORY_CARE_PROVIDER_SITE_OTHER): Payer: BLUE CROSS/BLUE SHIELD | Admitting: Endocrinology

## 2015-08-07 ENCOUNTER — Ambulatory Visit: Payer: Self-pay | Admitting: Internal Medicine

## 2015-08-07 ENCOUNTER — Ambulatory Visit (INDEPENDENT_AMBULATORY_CARE_PROVIDER_SITE_OTHER): Payer: BLUE CROSS/BLUE SHIELD | Admitting: Internal Medicine

## 2015-08-07 VITALS — BP 128/86 | HR 98 | Temp 98.3°F | Ht 67.0 in | Wt 326.0 lb

## 2015-08-07 VITALS — BP 130/90 | HR 93 | Temp 98.4°F | Ht 67.0 in | Wt 326.0 lb

## 2015-08-07 DIAGNOSIS — R61 Generalized hyperhidrosis: Secondary | ICD-10-CM | POA: Diagnosis not present

## 2015-08-07 DIAGNOSIS — F411 Generalized anxiety disorder: Secondary | ICD-10-CM | POA: Diagnosis not present

## 2015-08-07 DIAGNOSIS — I10 Essential (primary) hypertension: Secondary | ICD-10-CM | POA: Diagnosis not present

## 2015-08-07 DIAGNOSIS — E1140 Type 2 diabetes mellitus with diabetic neuropathy, unspecified: Secondary | ICD-10-CM

## 2015-08-07 DIAGNOSIS — Z794 Long term (current) use of insulin: Secondary | ICD-10-CM

## 2015-08-07 DIAGNOSIS — G43909 Migraine, unspecified, not intractable, without status migrainosus: Secondary | ICD-10-CM | POA: Diagnosis not present

## 2015-08-07 MED ORDER — TELMISARTAN 40 MG PO TABS: 40.0000 mg | ORAL_TABLET | Freq: Every day | ORAL | Status: DC

## 2015-08-07 NOTE — Patient Instructions (Addendum)
Please take all new medication as prescribed - the micardis 40 mg per day for blood pressure  You are given the note today  Please continue all other medications as before, and refills have been done if requested.  Please have the pharmacy call with any other refills you may need.  Please keep your appointments with your specialists as you may have planned  Please return in 3 months, or sooner if needed

## 2015-08-07 NOTE — Progress Notes (Signed)
Pre visit review using our clinic review tool, if applicable. No additional management support is needed unless otherwise documented below in the visit note. 

## 2015-08-07 NOTE — Progress Notes (Signed)
Subjective:    Patient ID: Nicole Melendez, female    DOB: [birth year], 39 y.o.   MRN: [id number]  HPI pt states DM was dx'ed in 2011, when she was on steroids for sarcoidosis; she has mild if any neuropathy of the lower extremities; she is unaware of any associated chronic complications; she has been on insulin since dx (prn humalog only); pt says her diet and exercise are good; she has never had GDM, pancreatitis, severe hypoglycemia or DKA.  She also reports episodic HTN.  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  Meter is downloaded today, and the printout is scanned into the record.  cbg is higher on days off.  It varies from 70-190.  There is no trend throughout the day.  sxs (listed below under ROS), happen only when cbg is over 150.  She says she tolerated janumet in the past.   Past Medical History  Diagnosis Date  . Morbid obesity ([place])   . Peptic ulcer disease   . GERD (gastroesophageal reflux disease)   . Migraine   . Anxiety   . Anemia   . Hyperlipidemia   . Menorrhagia   . PNA (pneumonia) july 2011  . Diabetes mellitus type II     steroid related  . Depression   . Sarcoid ([place])     including hand per rheumatology-Dr. Amil Amen  . Allergic rhinitis, cause unspecified   . Varicose veins with pain   . Positive ANA (antinuclear antibody) 02/14/2012  . Shortness of breath     on exertion  . History of blood transfusion   . Sarcoidosis of lung ([place])   . Otitis media 06/28/2015  . Essential hypertension 08/08/2015    Past Surgical History  Procedure Laterality Date  . Uterine ablation  03/2010  . Wisdom tooth extraction    . Abdominal hysterectomy      Social History   Social History  . Marital Status: Single    Spouse Name: N/A  . Number of Children: 0  . Years of Education: N/A   Occupational History  . tobacco worker    Social History Main Topics  . Smoking status: Former Smoker -- 1.00 packs/day for 20 years    Types: Cigarettes    Quit date:  10/25/2012  . Smokeless tobacco: Not on file     Comment: QUIT 04/2010 AND STARTED BACK 2014 X 3 MONTHS. less than 1 ppd.  started at age 50.    Marland Kitchen Alcohol Use: No  . Drug Use: No  . Sexual Activity: Yes    Birth Control/ Protection: None   Other Topics Concern  . Not on file   Social History Narrative   Pt lives with [name]- also pt of [facility]    Current Outpatient Prescriptions on File Prior to Visit  Medication Sig Dispense Refill  . ALPRAZolam (XANAX) 0.5 MG tablet TAKE 1 TABLET BY MOUTH THREE TIMES DAILY AS NEEDED 90 tablet 2  . B-D UF III MINI PEN NEEDLES 31G X 5 MM MISC USE FOUR TIMES DAILY FOR SLIDING SCALE AS NEEDED 200 each 0  . budesonide-formoterol (SYMBICORT) 160-4.5 MCG/ACT inhaler Inhale 2 puffs into the lungs 2 (two) times daily. 1 Inhaler 11  . cetirizine (ZYRTEC) 10 MG tablet TAKE 1 TABLET BY MOUTH EVERY DAY 30 tablet 5  . cholecalciferol (VITAMIN D) 1000 UNITS tablet Take 5,000 Units by mouth daily.    . cyclobenzaprine (FLEXERIL) 5 MG tablet Take 1 tablet (5  mg total) by mouth at bedtime as needed for muscle spasms. 30 tablet 3  . folic acid (FOLVITE) 1 MG tablet Take 1 tablet (1 mg total) by mouth daily. 60 tablet 5  . glucose blood (ONE TOUCH ULTRA TEST) test strip 1 each by Other route 4 (four) times daily. Use to check blood sugars four times a day Dx E11.9 100 each 3  . levalbuterol (XOPENEX HFA) 45 MCG/ACT inhaler Inhale 1-2 puffs into the lungs 2 (two) times daily. 15 g 11  . methotrexate (RHEUMATREX) 2.5 MG tablet Take 10 mg by mouth every Friday.     . naproxen (NAPROSYN) 500 MG tablet TAKE 1 TABLET BY MOUTH TWICE DAILY WITH A MEAL 60 tablet 5  . ondansetron (ZOFRAN) 4 MG tablet Take 1 tablet (4 mg total) by mouth every 8 (eight) hours as needed for nausea or vomiting. 15 tablet 0  . oxyCODONE-acetaminophen (PERCOCET/ROXICET) 5-325 MG tablet Take 1 tablet by mouth daily as needed. For pain  0  . predniSONE (DELTASONE) 5 MG tablet Take 1 tablet (5 mg total)  by mouth daily with breakfast. When current prednisone pulse of 10mg  dosing ends 60 tablet 6  . sertraline (ZOLOFT) 100 MG tablet 2 tabs by mouth per day (Patient taking differently: Take 200 mg by mouth daily. ) 180 tablet 3  . vitamin B-12 (CYANOCOBALAMIN) 1000 MCG tablet Take 1,000 mcg by mouth daily.    Marland Kitchen nystatin (MYCOSTATIN) 100000 UNIT/ML suspension Take 5 mLs (500,000 Units total) by mouth 4 (four) times daily. 180 mL 0  . omeprazole (PRILOSEC) 20 MG capsule Take 1 capsule (20 mg total) by mouth daily. 30 capsule 0   No current facility-administered medications on file prior to visit.    Allergies  Allergen Reactions  . Azithromycin Hives    (z pak) hives  . Penicillins Shortness Of Breath and Swelling    REACTION: swelling and difficulty breathing  . Klonopin [Clonazepam] Other (See Comments)    Memory difficulty    Family History  Problem Relation Age of Onset  . Diabetes Father   . Hypertension    . Allergies Mother   . Heart attack Mother   . Heart disease Father   . Rheum arthritis Father   . Ovarian cancer Maternal Aunt   . Lung cancer Maternal Aunt   . Breast cancer Maternal Aunt   . Bone cancer Maternal Aunt   . Other Neg Hx   . Diabetes Mother     BP 128/86 mmHg  Pulse 98  Temp(Src) 98.3 F (36.8 C) (Oral)  Ht 5\' 7"  (1.702 m)  Wt 326 lb (147.873 kg)  BMI 51.05 kg/m2  SpO2 96%  LMP 07/17/2012    Review of Systems denies chest pain, sob, n/v, urinary frequency, muscle cramps, depression, cold intolerance, and rhinorrhea.  she has intermittent blurry vision, extreme fatigue, and intermittent headache.  She has lost 20 lbs recently.  She reports excessive diaphoresis and easy bruising.    Objective:   Physical Exam VS: see vs page GEN: no distress.  Morbid obesity.   HEAD: head: no deformity eyes: no periorbital swelling, no proptosis external nose and ears are normal mouth: no lesion seen NECK: supple, thyroid is not enlarged CHEST WALL: no  deformity LUNGS:  Clear to auscultation CV: reg rate and rhythm, no murmur ABD: abdomen is soft, nontender.  no hepatosplenomegaly.  not distended.  no hernia.  MUSCULOSKELETAL: muscle bulk and strength are grossly normal.  no obvious joint swelling.  gait is normal and steady EXTEMITIES: no deformity.  no ulcer on the feet.  feet are of normal color and temp.  no edema PULSES: dorsalis pedis intact bilat.  no carotid bruit NEURO:  cn 2-12 grossly intact.   readily moves all 4's.  sensation is intact to touch on the feet SKIN:  Normal texture and temperature.  No rash or suspicious lesion is visible.   NODES:  None palpable at the neck PSYCH: alert, well-oriented.  Does not appear anxious nor depressed.   Lab Results  Component Value Date   HGBA1C 5.9 07/29/2015       Assessment & Plan:  DM: cbg's suggest worse glycemic control than a1c, so we'll check fructosamine.  She prob only needs medication on days off. Morbid obesity: she would benefit from surgery Episodic HTN, uncertain etiology   Patient is advised the following: Patient Instructions  good diet and exercise significantly improve the control of your diabetes.  please let me know if you wish to be referred to a dietician.  high blood sugar is very risky to your health.  you should see an eye doctor and dentist every year.  It is very important to get all recommended vaccinations.  controlling your blood pressure and cholesterol drastically reduces the damage diabetes does to your body.  Those who smoke should quit.  please discuss these with your doctor.  check your blood sugar once a day.  vary the time of day when you check, between before the 3 meals, and at bedtime.  also check if you have symptoms of your blood sugar being too high or too low.  please keep a record of the readings and bring it to your next appointment here (or you can bring the meter itself).  You can write it on any piece of paper.  please call us sooner  if your blood sugar goes below 70, or if you have a lot of readings over 200.  A 24-HR urine test is requested for you today.  We'll let you know about the results.   Please consider having weight loss surgery.  It is good for your health.  Here is some information about it.  If you decide to consider further, please call the phone number in the papers, and register for a free informational meeting.     A different type of diabetes blood test is requested for you today.  We'll let you know about the results.   After we know the result, janumet-XR may be our best option (or, depending on your insurance, a similar medication called "kombiglyze").  It may be best to just take this in your days off.   Please come back for a follow-up appointment in 2 months.

## 2015-08-07 NOTE — Patient Instructions (Addendum)
good diet and exercise significantly improve the control of your diabetes.  please let me know if you wish to be referred to a dietician.  high blood sugar is very risky to your health.  you should see an eye doctor and dentist every year.  It is very important to get all recommended vaccinations.  controlling your blood pressure and cholesterol drastically reduces the damage diabetes does to your body.  Those who smoke should quit.  please discuss these with your doctor.  check your blood sugar once a day.  vary the time of day when you check, between before the 3 meals, and at bedtime.  also check if you have symptoms of your blood sugar being too high or too low.  please keep a record of the readings and bring it to your next appointment here (or you can bring the meter itself).  You can write it on any piece of paper.  please call us sooner if your blood sugar goes below 70, or if you have a lot of readings over 200.  A 24-HR urine test is requested for you today.  We'll let you know about the results.   Please consider having weight loss surgery.  It is good for your health.  Here is some information about it.  If you decide to consider further, please call the phone number in the papers, and register for a free informational meeting.     A different type of diabetes blood test is requested for you today.  We'll let you know about the results.   After we know the result, janumet-XR may be our best option (or, depending on your insurance, a similar medication called "kombiglyze").  It may be best to just take this in your days off.   Please come back for a follow-up appointment in 2 months.

## 2015-08-07 NOTE — Progress Notes (Signed)
Subjective:    Patient ID: Nicole Melendez, female    DOB: [birth year], 39 y.o.   MRN: [id number]  HPI   Here to f/u; overall doing ok,  Pt denies chest pain, increasing sob or doe, wheezing, orthopnea, PND, increased LE swelling, palpitations, dizziness or syncope. Pt denies polydipsia, polyuria, or low sugar episode.  .   Pt states overall good compliance with meds, mostly trying to follow appropriate diet, with wt overall stable,  but little exercise however. Wt Readings from Last 3 Encounters:  08/07/15 326 lb (147.873 kg)  08/07/15 326 lb (147.873 kg)  07/29/15 323 lb (146.512 kg)   BP Readings from Last 3 Encounters:  08/07/15 130/90  08/07/15 128/86  08/02/15 154/93  Pt denies new neurological symptoms such as new headache, or facial or extremity weakness or numbness,though  c/o visual disturbance intermittent for several months, usually but not always assoc with HA as she has had for several years.  Has long hx of anxiety and migraine. . Saw optho - no evidence for DM, or sarcoid.  Has missed work several times, needs note.  Doing ok with respect to migraine overall per pt though tx for migraine at ED was assoc with improvement of symptoms. Denies worsening depressive symptoms, suicidal ideation, or panic; she attribrutes her symtpoms completely due to HTN though for the most part has been minimally elevated, except for a severe exacerbation with recent ED visit at [facility] per pt .  Her main focus today is to have work note for specific dates missed in last few months and FMLA filled out, making HTN her chief illness for this purpose. Past Medical History  Diagnosis Date  . Morbid obesity ([place])   . Peptic ulcer disease   . GERD (gastroesophageal reflux disease)   . Migraine   . Anxiety   . Anemia   . Hyperlipidemia   . Menorrhagia   . PNA (pneumonia) july 2011  . Diabetes mellitus type II     steroid related  . Depression   . Sarcoid ([place])     including hand per  rheumatology-Dr. Amil Amen  . Allergic rhinitis, cause unspecified   . Varicose veins with pain   . Positive ANA (antinuclear antibody) 02/14/2012  . Shortness of breath     on exertion  . History of blood transfusion   . Sarcoidosis of lung ([place])   . Otitis media 06/28/2015   Past Surgical History  Procedure Laterality Date  . Uterine ablation  03/2010  . Wisdom tooth extraction    . Abdominal hysterectomy      reports that she quit smoking about 2 years ago. Her smoking use included Cigarettes. She has a 20 pack-year smoking history. She does not have any smokeless tobacco history on file. She reports that she does not drink alcohol or use illicit drugs. family history includes Allergies in her mother; Bone cancer in her maternal aunt; Breast cancer in her maternal aunt; Diabetes in her father and mother; Heart attack in her mother; Heart disease in her father; Hypertension in an other family member; Lung cancer in her maternal aunt; Ovarian cancer in her maternal aunt; Rheum arthritis in her father. There is no history of Other. Allergies  Allergen Reactions  . Azithromycin Hives    (z pak) hives  . Penicillins Shortness Of Breath and Swelling    REACTION: swelling and difficulty breathing  . Klonopin [Clonazepam] Other (See Comments)    Memory difficulty   Current Outpatient Prescriptions  on File Prior to Visit  Medication Sig Dispense Refill  . ALPRAZolam (XANAX) 0.5 MG tablet TAKE 1 TABLET BY MOUTH THREE TIMES DAILY AS NEEDED 90 tablet 2  . B-D UF III MINI PEN NEEDLES 31G X 5 MM MISC USE FOUR TIMES DAILY FOR SLIDING SCALE AS NEEDED 200 each 0  . budesonide-formoterol (SYMBICORT) 160-4.5 MCG/ACT inhaler Inhale 2 puffs into the lungs 2 (two) times daily. 1 Inhaler 11  . cetirizine (ZYRTEC) 10 MG tablet TAKE 1 TABLET BY MOUTH EVERY DAY 30 tablet 5  . cholecalciferol (VITAMIN D) 1000 UNITS tablet Take 5,000 Units by mouth daily.    . cyclobenzaprine (FLEXERIL) 5 MG tablet Take 1 tablet  (5 mg total) by mouth at bedtime as needed for muscle spasms. 30 tablet 3  . folic acid (FOLVITE) 1 MG tablet Take 1 tablet (1 mg total) by mouth daily. 60 tablet 5  . glucose blood (ONE TOUCH ULTRA TEST) test strip 1 each by Other route 4 (four) times daily. Use to check blood sugars four times a day Dx E11.9 100 each 3  . levalbuterol (XOPENEX HFA) 45 MCG/ACT inhaler Inhale 1-2 puffs into the lungs 2 (two) times daily. 15 g 11  . methotrexate (RHEUMATREX) 2.5 MG tablet Take 10 mg by mouth every Friday.     . naproxen (NAPROSYN) 500 MG tablet TAKE 1 TABLET BY MOUTH TWICE DAILY WITH A MEAL 60 tablet 5  . nystatin (MYCOSTATIN) 100000 UNIT/ML suspension Take 5 mLs (500,000 Units total) by mouth 4 (four) times daily. 180 mL 0  . omeprazole (PRILOSEC) 20 MG capsule Take 1 capsule (20 mg total) by mouth daily. 30 capsule 0  . ondansetron (ZOFRAN) 4 MG tablet Take 1 tablet (4 mg total) by mouth every 8 (eight) hours as needed for nausea or vomiting. 15 tablet 0  . oxyCODONE-acetaminophen (PERCOCET/ROXICET) 5-325 MG tablet Take 1 tablet by mouth daily as needed. For pain  0  . predniSONE (DELTASONE) 5 MG tablet Take 1 tablet (5 mg total) by mouth daily with breakfast. When current prednisone pulse of 10mg  dosing ends 60 tablet 6  . sertraline (ZOLOFT) 100 MG tablet 2 tabs by mouth per day (Patient taking differently: Take 200 mg by mouth daily. ) 180 tablet 3  . vitamin B-12 (CYANOCOBALAMIN) 1000 MCG tablet Take 1,000 mcg by mouth daily.     No current facility-administered medications on file prior to visit.   Review of Systems  Constitutional: Negative for unusual diaphoresis or night sweats HENT: Negative for ringing in ear or discharge Eyes: Negative for double vision or worsening visual disturbance.  Respiratory: Negative for choking and stridor.   Gastrointestinal: Negative for vomiting or other signifcant bowel change Genitourinary: Negative for hematuria or change in urine volume.    Musculoskeletal: Negative for other MSK pain or swelling Skin: Negative for color change and worsening wound.  Neurological: Negative for tremors and numbness other than noted  Psychiatric/Behavioral: Negative for decreased concentration or agitation other than above       Objective:   Physical Exam BP 130/90 mmHg  Pulse 93  Temp(Src) 98.4 F (36.9 C) (Oral)  Ht 5\' 7"  (1.702 m)  Wt 326 lb (147.873 kg)  BMI 51.05 kg/m2  SpO2 96%  LMP 07/17/2012 VS noted, non toxic Constitutional: Pt appears in no significant distress HENT: Head: NCAT.  Right Ear: External ear normal.  Left Ear: External ear normal.  Eyes: . Pupils are equal, round, and reactive to light. Conjunctivae and EOM are  normal Neck: Normal range of motion. Neck supple.  Cardiovascular: Normal rate and regular rhythm.   Pulmonary/Chest: Effort normal and breath sounds without rales or wheezing.  Abd:  Soft, NT, ND, + BS Neurological: Pt is alert. Not confused , motor grossly intact Skin: Skin is warm. No rash, no LE edema Psychiatric: Pt behavior is normal. No agitation. 2+ nervous    Assessment & Plan:

## 2015-08-08 ENCOUNTER — Encounter: Payer: Self-pay | Admitting: Internal Medicine

## 2015-08-08 ENCOUNTER — Encounter: Payer: Self-pay | Admitting: Endocrinology

## 2015-08-08 DIAGNOSIS — I10 Essential (primary) hypertension: Secondary | ICD-10-CM | POA: Insufficient documentation

## 2015-08-08 HISTORY — DX: Essential (primary) hypertension: I10

## 2015-08-08 NOTE — Assessment & Plan Note (Signed)
[place] for work note, but with focus on anxiety, migraine, and HTN as well, d/w pt, declines neurology referral as other tx change, appears to have low insight or even denial issue

## 2015-08-08 NOTE — Assessment & Plan Note (Signed)
Chronic ongoing, declines change of tx or referral for counseling, just wants her forms as she seems in danger of losing job at [employer]

## 2015-08-08 NOTE — Assessment & Plan Note (Signed)
Agree with pt she likely has new but mild onset HTN, not likely directly related to her recent visual symptoms assoc with recurring migraine, but r/o contributing cause of HA.  She is s/p TAH.  With hx of DM- to start micardis 40 qd, cont to monitor BP at home and next visit,  to f/u any worsening symptoms or concerns

## 2015-08-09 ENCOUNTER — Other Ambulatory Visit: Payer: Self-pay | Admitting: Internal Medicine

## 2015-08-09 MED ORDER — ONETOUCH ULTRA 2 W/DEVICE KIT: PACK | Status: DC

## 2015-08-09 MED ORDER — GLUCOSE BLOOD VI STRP: 1.0000 | ORAL_STRIP | Freq: Four times a day (QID) | Status: DC

## 2015-08-11 ENCOUNTER — Telehealth: Payer: Self-pay | Admitting: *Deleted

## 2015-08-11 LAB — FRUCTOSAMINE: Fructosamine: 206 umol/L (ref 190–270)

## 2015-08-11 MED ORDER — ONETOUCH ULTRA 2 W/DEVICE KIT: PACK | Status: DC

## 2015-08-11 MED ORDER — GLUCOSE BLOOD VI STRP: ORAL_STRIP | Status: DC

## 2015-08-11 NOTE — Telephone Encounter (Signed)
Pt sent a message via MyChart and asked for there glucometer and strips be sent to [facility] on [address] due to the cost. Done.

## 2015-08-12 ENCOUNTER — Other Ambulatory Visit: Payer: BLUE CROSS/BLUE SHIELD

## 2015-08-12 DIAGNOSIS — R61 Generalized hyperhidrosis: Secondary | ICD-10-CM

## 2015-08-13 ENCOUNTER — Ambulatory Visit (INDEPENDENT_AMBULATORY_CARE_PROVIDER_SITE_OTHER): Payer: BLUE CROSS/BLUE SHIELD | Admitting: Internal Medicine

## 2015-08-13 ENCOUNTER — Encounter: Payer: Self-pay | Admitting: Internal Medicine

## 2015-08-13 VITALS — BP 116/78 | HR 96 | Temp 98.3°F | Wt 326.2 lb

## 2015-08-13 DIAGNOSIS — I10 Essential (primary) hypertension: Secondary | ICD-10-CM | POA: Diagnosis not present

## 2015-08-13 DIAGNOSIS — Z794 Long term (current) use of insulin: Secondary | ICD-10-CM

## 2015-08-13 DIAGNOSIS — E1140 Type 2 diabetes mellitus with diabetic neuropathy, unspecified: Secondary | ICD-10-CM | POA: Diagnosis not present

## 2015-08-13 DIAGNOSIS — G43909 Migraine, unspecified, not intractable, without status migrainosus: Secondary | ICD-10-CM

## 2015-08-13 MED ORDER — KETOROLAC TROMETHAMINE 30 MG/ML IJ SOLN
30.0000 mg | Freq: Once | INTRAMUSCULAR | Status: AC
  Administered 2015-08-13: 30 mg via INTRAVENOUS

## 2015-08-13 MED ORDER — BUTALBITAL-APAP-CAFFEINE 50-325-40 MG PO TABS: 1.0000 | ORAL_TABLET | Freq: Four times a day (QID) | ORAL | Status: DC | PRN

## 2015-08-13 MED ORDER — TOPIRAMATE 50 MG PO TABS: 50.0000 mg | ORAL_TABLET | Freq: Two times a day (BID) | ORAL | Status: DC

## 2015-08-13 MED ORDER — SUMATRIPTAN SUCCINATE 100 MG PO TABS: 100.0000 mg | ORAL_TABLET | ORAL | Status: DC | PRN

## 2015-08-13 NOTE — Patient Instructions (Signed)
You had the toradol 30 mg shot today for pain  Please take all new medication as prescribed - the imitrex and fioricet as needed, as well as starting the topamax  Remember start the topamax at 1/2 pill at bedtime for 3 nights, then 1/2 pill twice per day for 3 days, then 1/2 pill in the AM and 1 pill at bedtime for 3 days, then 1 pill twice per day after that  You are given the work note  You will be contacted regarding the referral for: Neurology  Please continue all other medications as before, and refills have been done if requested.  Please have the pharmacy call with any other refills you may need.  Please keep your appointments with your specialists as you may have planned

## 2015-08-13 NOTE — Assessment & Plan Note (Signed)
Now appears transformed to daily HA, for imitrex prn, fioricet breakthrough pain, topamax asd for prevention, and refer Neurology/Dr Lewit, will hold on imaging for now, ok for back to work oct 21

## 2015-08-13 NOTE — Assessment & Plan Note (Signed)
Improved, o/w stable overall by history and exam, recent data reviewed with pt, and pt to continue medical treatment as before,  to f/u any worsening symptoms or concerns BP Readings from Last 3 Encounters:  08/13/15 116/78  08/07/15 130/90  08/07/15 128/86

## 2015-08-13 NOTE — Progress Notes (Signed)
Pre visit review using our clinic review tool, if applicable. No additional management support is needed unless otherwise documented below in the visit note. 

## 2015-08-13 NOTE — Assessment & Plan Note (Signed)
stable overall by history and exam, recent data reviewed with pt, and pt to continue medical treatment as before,  to f/u any worsening symptoms or concerns Lab Results  Component Value Date   HGBA1C 5.9 07/29/2015

## 2015-08-13 NOTE — Progress Notes (Signed)
Subjective:    Patient ID: Nicole Melendez, female    DOB: 1976-03-13, 39 y.o.   MRN: [id number]  HPI  Here to f/u., good med compliance and BP overall improved and stable, but unfortunately states 2 wks daily Headaches will not improve, daily right sided, throbbing, with blurry vision occas, nausea without vomiting, and photophobia.  Imitrex has helped in the past.  No willing to re-try med.  Wants to get back to work on Fri oct 21 as bills getting high. Pt denies chest pain, increased sob or doe, wheezing, orthopnea, PND, increased LE swelling, palpitations, dizziness or syncope.  Pt denies new neurological symptoms such as new headache, or facial or extremity weakness or numbness except for the above.  Pt denies polydipsia, polyuria  Wondering if she needs imaging Past Medical History  Diagnosis Date  . Morbid obesity ([place])   . Peptic ulcer disease   . GERD (gastroesophageal reflux disease)   . Migraine   . Anxiety   . Anemia   . Hyperlipidemia   . Menorrhagia   . PNA (pneumonia) july 2011  . Diabetes mellitus type II     steroid related  . Depression   . Sarcoid ([place])     including hand per rheumatology-Dr. Amil Amen  . Allergic rhinitis, cause unspecified   . Varicose veins with pain   . Positive ANA (antinuclear antibody) 02/14/2012  . Shortness of breath     on exertion  . History of blood transfusion   . Sarcoidosis of lung ([place])   . Otitis media 06/28/2015  . Essential hypertension 08/08/2015   Past Surgical History  Procedure Laterality Date  . Uterine ablation  03/2010  . Wisdom tooth extraction    . Abdominal hysterectomy      reports that she quit smoking about 2 years ago. Her smoking use included Cigarettes. She has a 20 pack-year smoking history. She does not have any smokeless tobacco history on file. She reports that she does not drink alcohol or use illicit drugs. family history includes Allergies in her mother; Bone cancer in her maternal aunt; Breast cancer in  her maternal aunt; Diabetes in her father and mother; Heart attack in her mother; Heart disease in her father; Hypertension in an other family member; Lung cancer in her maternal aunt; Ovarian cancer in her maternal aunt; Rheum arthritis in her father. There is no history of Other. Allergies  Allergen Reactions  . Azithromycin Hives    (z pak) hives  . Penicillins Shortness Of Breath and Swelling    REACTION: swelling and difficulty breathing  . Klonopin [Clonazepam] Other (See Comments)    Memory difficulty   Current Outpatient Prescriptions on File Prior to Visit  Medication Sig Dispense Refill  . ALPRAZolam (XANAX) 0.5 MG tablet TAKE 1 TABLET BY MOUTH THREE TIMES DAILY AS NEEDED 90 tablet 2  . B-D UF III MINI PEN NEEDLES 31G X 5 MM MISC USE FOUR TIMES DAILY FOR SLIDING SCALE AS NEEDED 200 each 0  . Blood Glucose Monitoring Suppl (ONE TOUCH ULTRA 2) W/DEVICE KIT Use as directed four times daily. Dx: E11.9 1 each 0  . budesonide-formoterol (SYMBICORT) 160-4.5 MCG/ACT inhaler Inhale 2 puffs into the lungs 2 (two) times daily. 1 Inhaler 11  . cetirizine (ZYRTEC) 10 MG tablet TAKE 1 TABLET BY MOUTH EVERY DAY 30 tablet 5  . cholecalciferol (VITAMIN D) 1000 UNITS tablet Take 5,000 Units by mouth daily.    . cyclobenzaprine (FLEXERIL) 5 MG tablet Take 1  tablet (5 mg total) by mouth at bedtime as needed for muscle spasms. 30 tablet 3  . folic acid (FOLVITE) 1 MG tablet Take 1 tablet (1 mg total) by mouth daily. 60 tablet 5  . glucose blood (ONE TOUCH ULTRA TEST) test strip Use to check blood sugars four times a day Dx E11.9 400 each 11  . levalbuterol (XOPENEX HFA) 45 MCG/ACT inhaler Inhale 1-2 puffs into the lungs 2 (two) times daily. 15 g 11  . methotrexate (RHEUMATREX) 2.5 MG tablet Take 10 mg by mouth every Friday.     . naproxen (NAPROSYN) 500 MG tablet TAKE 1 TABLET BY MOUTH TWICE DAILY WITH A MEAL 60 tablet 5  . nystatin (MYCOSTATIN) 100000 UNIT/ML suspension Take 5 mLs (500,000 Units total)  by mouth 4 (four) times daily. 180 mL 0  . omeprazole (PRILOSEC) 20 MG capsule Take 1 capsule (20 mg total) by mouth daily. 30 capsule 0  . ondansetron (ZOFRAN) 4 MG tablet Take 1 tablet (4 mg total) by mouth every 8 (eight) hours as needed for nausea or vomiting. 15 tablet 0  . oxyCODONE-acetaminophen (PERCOCET/ROXICET) 5-325 MG tablet Take 1 tablet by mouth daily as needed. For pain  0  . predniSONE (DELTASONE) 5 MG tablet Take 1 tablet (5 mg total) by mouth daily with breakfast. When current prednisone pulse of 32m dosing ends 60 tablet 6  . sertraline (ZOLOFT) 100 MG tablet 2 tabs by mouth per day (Patient taking differently: Take 200 mg by mouth daily. ) 180 tablet 3  . telmisartan (MICARDIS) 40 MG tablet Take 1 tablet (40 mg total) by mouth daily. 90 tablet 3  . vitamin B-12 (CYANOCOBALAMIN) 1000 MCG tablet Take 1,000 mcg by mouth daily.     No current facility-administered medications on file prior to visit.    Review of Systems  Constitutional: Negative for unusual diaphoresis or night sweats HENT: Negative for ringing in ear or discharge Eyes: Negative for double vision or worsening visual disturbance.  Respiratory: Negative for choking and stridor.   Gastrointestinal: Negative for vomiting or other signifcant bowel change Genitourinary: Negative for hematuria or change in urine volume.  Musculoskeletal: Negative for other MSK pain or swelling Skin: Negative for color change and worsening wound.  Neurological: Negative for tremors and numbness other than noted  Psychiatric/Behavioral: Negative for decreased concentration or agitation other than above       Objective:   Physical Exam BP 116/78 mmHg  Pulse 96  Temp(Src) 98.3 F (36.8 C)  Wt 326 lb 4 oz (147.986 kg)  SpO2 98%  LMP 07/17/2012 VS noted,  Constitutional: Pt appears in no significant distress HENT: Head: NCAT.  Right Ear: External ear normal.  Left Ear: External ear normal.  Eyes: . Pupils are equal, round,  and reactive to light. Conjunctivae and EOM are normal Neck: Normal range of motion. Neck supple.  Cardiovascular: Normal rate and regular rhythm.   Pulmonary/Chest: Effort normal and breath sounds without rales or wheezing.  Abd:  Soft, NT, ND, + BS Neurological: Pt is alert. Not confused , motor 5/5 intact, cn 2-12 intact Skin: Skin is warm. No rash, no LE edema Psychiatric: Pt behavior is normal. No agitation.     Assessment & Plan:

## 2015-08-17 LAB — CATECHOLAMINES, FRACTIONATED, URINE, 24 HOUR
Calculated Total (E+NE): 69 mcg/24 h (ref 26–121)
Creatinine, Urine mg/day-CATEUR: 1.79 g/(24.h) (ref 0.63–2.50)
DOPAMINE, 24 HR URINE: 341 ug/(24.h) (ref 52–480)
Norepinephrine, 24 hr Ur: 69 mcg/24 h (ref 15–100)
TOTAL VOLUME - CF 24HR U: 2544 mL

## 2015-08-21 LAB — METANEPHRINES, URINE, 24 HOUR
METANEPHRINES UR: 74 ug/(24.h) (ref 58–203)
Metaneph Total, Ur: 410 mcg/24 h (ref 182–739)
NORMETANEPHRINE 24H UR: 336 ug/(24.h) (ref 88–649)

## 2015-08-22 ENCOUNTER — Ambulatory Visit (INDEPENDENT_AMBULATORY_CARE_PROVIDER_SITE_OTHER): Payer: BLUE CROSS/BLUE SHIELD | Admitting: Internal Medicine

## 2015-08-22 ENCOUNTER — Telehealth: Payer: Self-pay | Admitting: Pulmonary Disease

## 2015-08-22 ENCOUNTER — Ambulatory Visit (INDEPENDENT_AMBULATORY_CARE_PROVIDER_SITE_OTHER)
Admission: RE | Admit: 2015-08-22 | Discharge: 2015-08-22 | Disposition: A | Discharge status: Home / Self Care | Payer: BLUE CROSS/BLUE SHIELD | Source: Ambulatory Visit | Attending: Internal Medicine | Admitting: Internal Medicine

## 2015-08-22 ENCOUNTER — Encounter: Payer: Self-pay | Admitting: Internal Medicine

## 2015-08-22 ENCOUNTER — Other Ambulatory Visit: Payer: Self-pay | Admitting: Pulmonary Disease

## 2015-08-22 VITALS — BP 128/82 | HR 105 | Temp 98.4°F | Ht 67.0 in | Wt 321.6 lb

## 2015-08-22 DIAGNOSIS — J209 Acute bronchitis, unspecified: Secondary | ICD-10-CM

## 2015-08-22 DIAGNOSIS — M130 Polyarthritis, unspecified: Secondary | ICD-10-CM

## 2015-08-22 DIAGNOSIS — D869 Sarcoidosis, unspecified: Secondary | ICD-10-CM

## 2015-08-22 MED ORDER — LEVOFLOXACIN 500 MG PO TABS: 500.0000 mg | ORAL_TABLET | Freq: Every day | ORAL | Status: DC

## 2015-08-22 MED ORDER — METHYLPREDNISOLONE ACETATE 80 MG/ML IJ SUSP
80.0000 mg | Freq: Once | INTRAMUSCULAR | Status: AC
  Administered 2015-08-22: 80 mg via INTRAMUSCULAR

## 2015-08-22 MED ORDER — BENZONATATE 200 MG PO CAPS: 200.0000 mg | ORAL_CAPSULE | Freq: Three times a day (TID) | ORAL | Status: DC | PRN

## 2015-08-22 MED ORDER — LEVALBUTEROL HCL 0.63 MG/3ML IN NEBU
0.6300 mg | INHALATION_SOLUTION | Freq: Once | RESPIRATORY_TRACT | Status: AC
  Administered 2015-08-22: 0.63 mg via RESPIRATORY_TRACT

## 2015-08-22 NOTE — Assessment & Plan Note (Signed)
Known sarcoid and arthritis with question of sarcoid arthritis. Continues maintenance prednisone and methotrexate per rheumatology

## 2015-08-22 NOTE — Patient Instructions (Addendum)
Neb xop 0.63  Depo 80  Script sent for benzonatate perles for cough  Go ahead and take the levaquin script you were given last night  Order- CXR     Dx sarcoid, acute bronchitis  Keep appointment with Dr. Lamonte Sakai for sarcoid follow-up in November

## 2015-08-22 NOTE — Assessment & Plan Note (Signed)
Uncertain if sarcoid arthritis. Followed by rheumatology

## 2015-08-22 NOTE — Telephone Encounter (Signed)
Spoke with pt.  She has fever 101F, cold shakes, cough with white sputum, back pain, pain with deep breath and coughing, and intermittent wheeze.  She has been using inhalers more.  She is on prednisone and MTX.  She has allergy to PCN and zithromax.  Will send script for levaquin.  Will have triage arrange for Acute visit in AM of 08/22/15.

## 2015-08-22 NOTE — Assessment & Plan Note (Signed)
Potential to escalate since she is on maintenance immunosuppressants Plan-chest x-ray, nebulizer treatments Xopenex, Depo-Medrol, filled the prescription for Levaquin sent him for her last night.

## 2015-08-22 NOTE — Telephone Encounter (Signed)
Called and spoke to pt. Appt made with CY today (08/22/2015). Appt OK'ed by Sadie Haber. Pt verbalized understanding and denied any further questions or concerns at this time.

## 2015-08-22 NOTE — Progress Notes (Signed)
Quick Note:  Attempted to contact pt, LMTCB ______

## 2015-08-22 NOTE — Progress Notes (Signed)
HPI 39 yo morbidly obese former smoker female with sarcoid manifested as arthritis and DOE  07/02/2015 Follow up : Sarcoid  Pt returns for follow up . Former pt of Dr. Gwenette Greet , to establish with Dr. Lamonte Sakai Last this year.  She has sarcoid on methotrexate and chronic prednisone at 5mg  daily  Had recent sarcoid flare with increased joint pain/swelling in hands and increased cough and dyspnea.  She was given prednisone burst. On 8/27. Says she if felling much better with decreased joint pain and breathing has improved. Previously tapered off symbicort in May but restarted last month when breathing worsening . Feels it has helped.  Currently on prednisone 10mg  daily .  Follows with rheumatology , on MTX and Folic acid.  Last cxr in April this year with chronic changes.  Needs refill of folic acid .  Denies chest pain, orthopnea, edema or fever. No rash .  08/22/15- 75 yoF39 yo morbidly obese former smoker female with sarcoid manifested as arthritis and DOE Acute visit-c/o prod cough with clear mucus,fever 101.1-101.9, upper back pain, occ chest pain and body aches. Started yesterday. Bilateral ear pain.   Has follow-up appointment with Dr. Lamonte Sakai in November for her sarcoid. On maintenance prednisone 5 mg plus methotrexate for arthritis? Sarcoid. Has had flu vaccine. Sick exposures. Abrupt onset yesterday of illness with temperature 100.1, cold shakes, chest tight, congestion, sniffing, clear mucus. 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 Levaquin called in by on-call doctor last night. She has not yet filled it.  ROS-see HPI   Negative unless "+" Constitutional:    weight loss, night sweats, + fevers, chills, fatigue, lassitude. HEENT:    headaches, difficulty swallowing, tooth/dental problems, sore throat,       sneezing, itching, + ear ache, + nasal congestion, post nasal drip, snoring CV:    chest pain, orthopnea, PND, swelling in lower  extremities, anasarca,                                                  dizziness, palpitations Resp:   +short of breath with exertion or at rest.               + productive cough,   non-productive cough, coughing up of blood.              change in color of mucus.  wheezing.   Skin:    rash or lesions. GI:  No-   heartburn, indigestion, abdominal pain, nausea, vomiting,  GU:. MS:  + joint pain, stiffness, decreased range of motion, back pain. Neuro-     nothing unusual Psych:  change in mood or affect.  depression or anxiety.   memory loss.  OBJ- Physical Exam General- Alert, Oriented, Affect-appropriate, Distress- none acute, + morbidly obese Skin- rash-none, lesions- none, excoriation- none Lymphadenopathy- none Head- atraumatic            Eyes- Gross vision intact, PERRLA, conjunctivae and secretions clear            Ears- Hearing, canals-normal            Nose- Clear, no-Septal dev, mucus, polyps, erosion, perforation             Throat- Mallampati II , mucosa + stained red by medication , drainage- none, tonsils-  atrophic Neck- flexible , trachea midline, no stridor , thyroid nl, carotid no bruit Chest - symmetrical excursion , unlabored           Heart/CV- RRR , no murmur , no gallop  , no rub, nl s1 s2                           - JVD- none , edema- none, stasis changes- none, varices- none           Lung- clear to P&A, wheeze- none, cough + dry , dullness-none, rub- none           Chest wall-  Abd-  Br/ Gen/ Rectal- Not done, not indicated Extrem- cyanosis- none, clubbing, none, atrophy- none, strength- nl Neuro- grossly intact to observation

## 2015-08-26 ENCOUNTER — Telehealth: Payer: Self-pay | Admitting: Internal Medicine

## 2015-08-26 NOTE — Telephone Encounter (Signed)
Okay for work note as requested

## 2015-08-26 NOTE — Telephone Encounter (Signed)
Per Dr. Annamaria Boots, ok for work note.  Work note created, signed and left up front for patient to pick up.  Patient notified. Nothing further needed. Closing encounter

## 2015-08-26 NOTE — Telephone Encounter (Signed)
Called and spoke to pt. Informed her of the CXR results. Pt verbalized understanding. Pt also requesting a letter excusing her from work on 08/23/15 and 08/24/15 because she was not feeling well, pt will go back to work on 08/29/15.  Dr. Annamaria Boots please advise on letter. Thanks.     Notes Recorded by Deneise Lever, MD on 08/22/2015 at 2:59 PM CXR- Stable old scarring that looks like old sarcoid. No pneumonia or obvious active process.

## 2015-08-26 NOTE — Progress Notes (Signed)
Quick Note:  Please see phone note from 11.1.16. ______

## 2015-08-27 ENCOUNTER — Encounter: Payer: Self-pay | Admitting: Internal Medicine

## 2015-08-27 ENCOUNTER — Other Ambulatory Visit: Payer: Self-pay | Admitting: Internal Medicine

## 2015-08-27 NOTE — Telephone Encounter (Signed)
Rx faxed to pharmacy  

## 2015-08-27 NOTE — Telephone Encounter (Signed)
Done Done hardcopy to Dahlia  

## 2015-09-02 ENCOUNTER — Ambulatory Visit (INDEPENDENT_AMBULATORY_CARE_PROVIDER_SITE_OTHER): Payer: BLUE CROSS/BLUE SHIELD | Admitting: Internal Medicine

## 2015-09-02 VITALS — BP 126/86 | HR 91 | Temp 98.2°F | Ht 67.0 in | Wt 326.0 lb

## 2015-09-02 DIAGNOSIS — R0609 Other forms of dyspnea: Secondary | ICD-10-CM

## 2015-09-02 DIAGNOSIS — R05 Cough: Secondary | ICD-10-CM

## 2015-09-02 DIAGNOSIS — R059 Cough, unspecified: Secondary | ICD-10-CM

## 2015-09-02 DIAGNOSIS — R0600 Dyspnea, unspecified: Secondary | ICD-10-CM

## 2015-09-02 DIAGNOSIS — Z794 Long term (current) use of insulin: Secondary | ICD-10-CM

## 2015-09-02 DIAGNOSIS — E1140 Type 2 diabetes mellitus with diabetic neuropathy, unspecified: Secondary | ICD-10-CM

## 2015-09-02 MED ORDER — PREDNISONE 10 MG PO TABS: ORAL_TABLET | ORAL | Status: DC

## 2015-09-02 MED ORDER — HYDROCODONE-HOMATROPINE 5-1.5 MG/5ML PO SYRP: 5.0000 mL | ORAL_SOLUTION | Freq: Four times a day (QID) | ORAL | Status: DC | PRN

## 2015-09-02 MED ORDER — METHYLPREDNISOLONE ACETATE 80 MG/ML IJ SUSP
80.0000 mg | Freq: Once | INTRAMUSCULAR | Status: AC
  Administered 2015-09-02: 80 mg via INTRAMUSCULAR

## 2015-09-02 MED ORDER — DOXYCYCLINE HYCLATE 100 MG PO TABS: 100.0000 mg | ORAL_TABLET | Freq: Two times a day (BID) | ORAL | Status: DC

## 2015-09-02 NOTE — Assessment & Plan Note (Signed)
Etiology unclear but acute, doubt PE, diff includes bronchospasm related tin infectious, or sarcoid flare.  Recent cxr neg.  Sat ok for now. For depo 80 today, predpac asd, cont'd inhaler use, work forms filled out, will hold on further imaging but consider ct chest is not improved, to f/u with pulm nov 28 as planned, also for cough med prn use

## 2015-09-02 NOTE — Assessment & Plan Note (Signed)
Also for doxy course,  to f/u any worsening symptoms or concerns

## 2015-09-02 NOTE — Progress Notes (Signed)
Subjective:    Patient ID: Nicole Melendez, female    DOB: [birth year], 39 y.o.   MRN: [id number]  HPI  Here to f/u, unfortunately has not improved after seeing Dr Annamaria Boots oct 28, did take the levaquin and inhalers as prescribed, received depo 80, cxr neg for acute, asked to f/u with Dr Lamonte Sakai nov 28 as planned.  Pt has missed work Oct 29.30, and Nov 4,5,6 - needs work note. Today with persistent cough, scant productive, feels feverish, no chills, has left post lat intermittent sharp chest pain worse with cough, but most significant is persistent doe.  No sob at rest, and can tolerated moving about slowly, but normal walking speed causes doe.  No overt wheezing, uses inhalers regularly.  Has hx of elev  BS with steorid use,  Pt denies polydipsia, polyuria, CBG's have been low 100s Past Medical History  Diagnosis Date  . Morbid obesity ([place])   . Peptic ulcer disease   . GERD (gastroesophageal reflux disease)   . Migraine   . Anxiety   . Anemia   . Hyperlipidemia   . Menorrhagia   . PNA (pneumonia) july 2011  . Diabetes mellitus type II     steroid related  . Depression   . Sarcoid ([place])     including hand per rheumatology-Dr. Amil Amen  . Allergic rhinitis, cause unspecified   . Varicose veins with pain   . Positive ANA (antinuclear antibody) 02/14/2012  . Shortness of breath     on exertion  . History of blood transfusion   . Sarcoidosis of lung ([place])   . Otitis media 06/28/2015  . Essential hypertension 08/08/2015   Past Surgical History  Procedure Laterality Date  . Uterine ablation  03/2010  . Wisdom tooth extraction    . Abdominal hysterectomy      reports that she quit smoking about 2 years ago. Her smoking use included Cigarettes. She has a 20 pack-year smoking history. She does not have any smokeless tobacco history on file. She reports that she does not drink alcohol or use illicit drugs. family history includes Allergies in her mother; Bone cancer in her maternal aunt; Breast  cancer in her maternal aunt; Diabetes in her father and mother; Heart attack in her mother; Heart disease in her father; Hypertension in an other family member; Lung cancer in her maternal aunt; Ovarian cancer in her maternal aunt; Rheum arthritis in her father. There is no history of Other. Allergies  Allergen Reactions  . Azithromycin Hives    (z pak) hives  . Penicillins Shortness Of Breath and Swelling    REACTION: swelling and difficulty breathing  . Klonopin [Clonazepam] Other (See Comments)    Memory difficulty   Current Outpatient Prescriptions on File Prior to Visit  Medication Sig Dispense Refill  . ALPRAZolam (XANAX) 0.5 MG tablet TAKE 1 TABLET BY MOUTH THREE TIMES DAILY AS NEEDED 90 tablet 2  . B-D UF III MINI PEN NEEDLES 31G X 5 MM MISC USE FOUR TIMES DAILY FOR SLIDING SCALE AS NEEDED 200 each 0  . benzonatate (TESSALON) 200 MG capsule Take 1 capsule (200 mg total) by mouth 3 (three) times daily as needed for cough. 30 capsule 1  . Blood Glucose Monitoring Suppl (ONE TOUCH ULTRA 2) W/DEVICE KIT Use as directed four times daily. Dx: E11.9 1 each 0  . budesonide-formoterol (SYMBICORT) 160-4.5 MCG/ACT inhaler Inhale 2 puffs into the lungs 2 (two) times daily. 1 Inhaler 11  . butalbital-acetaminophen-caffeine (FIORICET) 50-325-40 MG  tablet Take 1-2 tablets by mouth every 6 (six) hours as needed for headache. 15 tablet 0  . cetirizine (ZYRTEC) 10 MG tablet TAKE 1 TABLET BY MOUTH EVERY DAY 30 tablet 5  . cholecalciferol (VITAMIN D) 1000 UNITS tablet Take 5,000 Units by mouth daily.    . cyclobenzaprine (FLEXERIL) 5 MG tablet Take 1 tablet (5 mg total) by mouth at bedtime as needed for muscle spasms. 30 tablet 3  . folic acid (FOLVITE) 1 MG tablet Take 1 tablet (1 mg total) by mouth daily. 60 tablet 5  . glucose blood (ONE TOUCH ULTRA TEST) test strip Use to check blood sugars four times a day Dx E11.9 400 each 11  . guaifenesin (ROBITUSSIN) 100 MG/5ML syrup Take 200 mg by mouth 3  (three) times daily as needed for cough.    . levalbuterol (XOPENEX HFA) 45 MCG/ACT inhaler Inhale 1-2 puffs into the lungs 2 (two) times daily. 15 g 11  . methotrexate (RHEUMATREX) 2.5 MG tablet Take 10 mg by mouth every Friday.     . naproxen (NAPROSYN) 500 MG tablet TAKE 1 TABLET BY MOUTH TWICE DAILY WITH A MEAL 60 tablet 5  . nystatin (MYCOSTATIN) 100000 UNIT/ML suspension Take 5 mLs (500,000 Units total) by mouth 4 (four) times daily. 180 mL 0  . ondansetron (ZOFRAN) 4 MG tablet Take 1 tablet (4 mg total) by mouth every 8 (eight) hours as needed for nausea or vomiting. 15 tablet 0  . oxyCODONE-acetaminophen (PERCOCET/ROXICET) 5-325 MG tablet Take 1 tablet by mouth daily as needed. For pain  0  . predniSONE (DELTASONE) 5 MG tablet Take 1 tablet (5 mg total) by mouth daily with breakfast. When current prednisone pulse of 80m dosing ends 60 tablet 6  . sertraline (ZOLOFT) 100 MG tablet 2 tabs by mouth per day (Patient taking differently: Take 200 mg by mouth daily. ) 180 tablet 3  . Sodium Chloride-Sodium Bicarb (NETI POT SINUS WASH) 2300-700 MG KIT Place into the nose.    . SUMAtriptan (IMITREX) 100 MG tablet Take 1 tablet (100 mg total) by mouth every 2 (two) hours as needed for migraine. May repeat in 2 hours if headache persists or recurs. 10 tablet 11  . telmisartan (MICARDIS) 40 MG tablet Take 1 tablet (40 mg total) by mouth daily. 90 tablet 3  . topiramate (TOPAMAX) 50 MG tablet Take 1 tablet (50 mg total) by mouth 2 (two) times daily. 60 tablet 5  . vitamin B-12 (CYANOCOBALAMIN) 1000 MCG tablet Take 1,000 mcg by mouth daily.     No current facility-administered medications on file prior to visit.   Review of Systems  Constitutional: Negative for unusual diaphoresis or night sweats HENT: Negative for ringing in ear or discharge Eyes: Negative for double vision or worsening visual disturbance.  Respiratory: Negative for choking and stridor.   Gastrointestinal: Negative for vomiting or  other signifcant bowel change Genitourinary: Negative for hematuria or change in urine volume.  Musculoskeletal: Negative for other MSK pain or swelling Skin: Negative for color change and worsening wound.  Neurological: Negative for tremors and numbness other than noted  Psychiatric/Behavioral: Negative for decreased concentration or agitation other than above       Objective:   Physical Exam BP 126/86 mmHg  Pulse 91  Temp(Src) 98.2 F (36.8 C) (Oral)  Ht '5\' 7"'  (1.702 m)  Wt 326 lb (147.873 kg)  BMI 51.05 kg/m2  SpO2 97%  LMP 07/17/2012 VS noted, non toxic, btu breathy and some difficulty speaking full  sentences with soe No accessory muscle use Constitutional: Pt appears in no significant distress HENT: Head: NCAT.  Right Ear: External ear normal.  Left Ear: External ear normal.  Eyes: . Pupils are equal, round, and reactive to light. Conjunctivae and EOM are normal Neck: Normal range of motion. Neck supple.  Cardiovascular: Normal rate and regular rhythm.   Pulmonary/Chest: Effort normal and breath sounds decaresed without rales or frank wheezing.  Neurological: Pt is alert. Not confused , motor grossly intact Skin: Skin is warm. No rash, no LE edema Psychiatric: Pt behavior is normal. No agitation.     Assessment & Plan:

## 2015-09-02 NOTE — Assessment & Plan Note (Signed)
stable overall by history and exam, recent data reviewed with pt, and pt to continue medical treatment as before,  to f/u any worsening symptoms or concerns Lab Results  Component Value Date   HGBA1C 5.9 07/29/2015   Pt to call for onset polys or cbg > 200 with steroid tx

## 2015-09-02 NOTE — Patient Instructions (Addendum)
You had the steroid shot today  Please take all new medication as prescribed - the antibiotic, cough medicine, and prednisone  Please continue all other medications as before, including your inhalers  Please have the pharmacy call with any other refills you may need.  Please continue your efforts at being more active, low cholesterol diet, and weight control.  Please keep your appointments with your specialists as you may have planned - Dr Lamonte Sakai on Nov 28

## 2015-09-02 NOTE — Addendum Note (Signed)
Addended by: Lyman Bishop on: 09/02/2015 09:52 AM   Modules accepted: Orders

## 2015-09-02 NOTE — Progress Notes (Signed)
Pre visit review using our clinic review tool, if applicable. No additional management support is needed unless otherwise documented below in the visit note. 

## 2015-09-05 ENCOUNTER — Encounter: Payer: Self-pay | Admitting: Internal Medicine

## 2015-09-08 DIAGNOSIS — Z0279 Encounter for issue of other medical certificate: Secondary | ICD-10-CM

## 2015-09-12 ENCOUNTER — Encounter: Payer: Self-pay | Admitting: Internal Medicine

## 2015-09-15 NOTE — Telephone Encounter (Signed)
Pt called concern about paper work that just fill out for her, she said that section 5 need to be fix and get Dr. Jenny Reichmann to initial it and refax. Please give her a call at [phone]

## 2015-09-23 ENCOUNTER — Ambulatory Visit: Payer: Self-pay | Admitting: Emergency Medicine

## 2015-09-23 ENCOUNTER — Ambulatory Visit (INDEPENDENT_AMBULATORY_CARE_PROVIDER_SITE_OTHER): Payer: BLUE CROSS/BLUE SHIELD | Admitting: Emergency Medicine

## 2015-09-23 ENCOUNTER — Encounter: Payer: Self-pay | Admitting: Emergency Medicine

## 2015-09-23 VITALS — BP 110/84 | HR 83 | Ht 66.0 in | Wt 324.0 lb

## 2015-09-23 DIAGNOSIS — D869 Sarcoidosis, unspecified: Secondary | ICD-10-CM | POA: Diagnosis not present

## 2015-09-23 DIAGNOSIS — J309 Allergic rhinitis, unspecified: Secondary | ICD-10-CM

## 2015-09-23 LAB — PULMONARY FUNCTION TEST
DL/VA % pred: 85 %
DL/VA: 4.33 ml/min/mmHg/L
DLCO unc % pred: 64 %
DLCO unc: 17.4 ml/min/mmHg
FEF 25-75 POST: 2.54 L/s
FEF 25-75 Pre: 2.49 L/sec
FEF2575-%CHANGE-POST: 2 %
FEF2575-%PRED-POST: 87 %
FEF2575-%PRED-PRE: 85 %
FEV1-%CHANGE-POST: 1 %
FEV1-%PRED-POST: 86 %
FEV1-%PRED-PRE: 84 %
FEV1-POST: 2.31 L
FEV1-PRE: 2.28 L
FEV1FVC-%CHANGE-POST: 0 %
FEV1FVC-%PRED-PRE: 101 %
FEV6-%Change-Post: 1 %
FEV6-%Pred-Post: 84 %
FEV6-%Pred-Pre: 83 %
FEV6-Post: 2.73 L
FEV6-Pre: 2.7 L
FEV6FVC-%Change-Post: 0 %
FEV6FVC-%PRED-POST: 101 %
FEV6FVC-%Pred-Pre: 101 %
FVC-%CHANGE-POST: 2 %
FVC-%PRED-PRE: 82 %
FVC-%Pred-Post: 84 %
FVC-POST: 2.77 L
FVC-PRE: 2.71 L
POST FEV1/FVC RATIO: 84 %
PRE FEV1/FVC RATIO: 84 %
Post FEV6/FVC ratio: 99 %
Pre FEV6/FVC Ratio: 99 %
RV % pred: 85 %
RV: 1.48 L
TLC % PRED: 76 %
TLC: 4.11 L

## 2015-09-23 NOTE — Assessment & Plan Note (Signed)
Add back fluticasone nasal spray to her daily Zyrtec. She has had some difficulty tolerating this in the past. If the fluticasone irritates her nose and we will look for an alternative. She might also benefit from nasal saline washes

## 2015-09-23 NOTE — Assessment & Plan Note (Signed)
Pulmonary function testing today shows stable FEV1, no evidence of overt obstruction, no bronchodilator response. Suspect that she does have an asthmatic tendency when she flares or has an upper respiratory infection. I do not want to restart Symbicort at this time. We will perform a CT scan of her chest to compare with 2011. Believe she also needs a sputum culture for bacterial, fungal and AFB given her micronodular disease on the old CT scan and her persistent cough and mucus production.

## 2015-09-23 NOTE — Patient Instructions (Addendum)
We will not restart Symbicort at this time Please continue to keep Xopenex available to use if needed for shortness of breath Continue Zyrtec every day Restart your fluticasone nasal spray, 2 sprays each nostril once a day Continue your methotrexate and prednisone 5 mg as ordered We will perform a CT scan of your chest to compare with 2011 We will perform a sputum culture Follow with Dr Lamonte Sakai in 6 weeks or sooner if you have any problems.

## 2015-09-23 NOTE — Progress Notes (Signed)
Subjective:    Patient ID: Nicole Melendez, female    DOB: Apr 01, 1976, 39 y.o.   MRN: [id number]  HPI 39 year old woman with a history of sarcoidosis and former tobacco use (20 pack years), diabetes, GERD, hypertension. Followed in the past by Dr Gwenette Greet and had been managed on Symbicort (stopped last time), methotrexate, and daily prednisone. Current prednisone dose is 20m. She has been seen at the end of October and also on 09/02/15 by Dr. JJenny Reichmannfor progressive dyspnea that was treated with corticosteroids and Levaquin, completed 3 weeks ago.  She underwent pulmonary function testing today that I personally reviewed. This shows grossly normal airflows without a bronchodilator response, now restriction based on a slightly decreased TLC, and a decreased diffusion capacity that corrects to normal range for alveolar volume.   Having nasal drainage, some green. Cough is a bit better than last month, but now producing colored mucous. She is on zyrtec, has stopped the flonase nasal spray. She is doing nasal saline spray. She gets regular eye exams.    Review of Systems   Past Medical History  Diagnosis Date  . [place]bid [place]   . Peptic ulcer disease   . GERD (gastroesophageal reflux disease)   . Migraine   . Anxiety   . Anemia   . Hyperlipidemia   . Menorrhagia   . PNA (pneumonia) july 2011  . Diabetes mellitus type II     steroid related  . Depression   . [place]     including hand per rheumatology-Dr. BAmil Amen . Allergic rhinitis, cause unspecified   . Varicose veins with pain   . Positive ANA (antinuclear antibody) 02/14/2012  . Shortness of breath     on exertion  . History of blood transfusion   . [place]   . Otitis media 06/28/2015  . Essential hypertension 08/08/2015     Family History  Problem Relation Age of Onset  . Diabetes Father   . Hypertension    . Allergies Mother   . Heart attack Mother   . Heart disease Father   . Rheum arthritis  Father   . Ovarian cancer Maternal Aunt   . Lung cancer Maternal Aunt   . Breast cancer Maternal Aunt   . Bone cancer Maternal Aunt   . Other Neg Hx   . Diabetes Mother      Social History   Social History  . Marital Status: Single    Spouse Name: N/A  . Number of Children: 0  . Years of Education: N/A   Occupational History  . tobacco worker    Social History Main Topics  . Smoking status: Former Smoker -- 1.00 packs/day for 20 years    Types: Cigarettes    Quit date: 10/25/2012  . Smokeless tobacco: Not on file     Comment: QUIT 04/2010 AND STARTED BACK 2014 X 3 MONTHS. less than 1 ppd.  started at age 39    .Marland KitchenAlcohol Use: No  . Drug Use: No  . Sexual Activity: Yes    Birth Control/ Protection: None   Other Topics Concern  . Not on file   Social History Narrative   Pt lives with ABrooke Bonito also pt of [facility]     Allergies  Allergen Reactions  . Azithromycin Hives    (z pak) hives  . Penicillins Shortness Of Breath and Swelling    REACTION: swelling and difficulty breathing  . Klonopin [Clonazepam] Other (See Comments)  Memory difficulty     Outpatient Prescriptions Prior to Visit  Medication Sig Dispense Refill  . ALPRAZolam (XANAX) 0.5 MG tablet TAKE 1 TABLET BY MOUTH THREE TIMES DAILY AS NEEDED 90 tablet 2  . B-D UF III MINI PEN NEEDLES 31G X 5 MM MISC USE FOUR TIMES DAILY FOR SLIDING SCALE AS NEEDED 200 each 0  . Blood Glucose Monitoring Suppl (ONE TOUCH ULTRA 2) W/DEVICE KIT Use as directed four times daily. Dx: E11.9 1 each 0  . budesonide-formoterol (SYMBICORT) 160-4.5 MCG/ACT inhaler Inhale 2 puffs into the lungs 2 (two) times daily. 1 Inhaler 11  . butalbital-acetaminophen-caffeine (FIORICET) 50-325-40 MG tablet Take 1-2 tablets by mouth every 6 (six) hours as needed for headache. 15 tablet 0  . cetirizine (ZYRTEC) 10 MG tablet TAKE 1 TABLET BY MOUTH EVERY DAY 30 tablet 5  . cholecalciferol (VITAMIN D) 1000 UNITS tablet Take 5,000 Units by mouth  daily.    . cyclobenzaprine (FLEXERIL) 5 MG tablet Take 1 tablet (5 mg total) by mouth at bedtime as needed for muscle spasms. 30 tablet 3  . folic acid (FOLVITE) 1 MG tablet Take 1 tablet (1 mg total) by mouth daily. 60 tablet 5  . glucose blood (ONE TOUCH ULTRA TEST) test strip Use to check blood sugars four times a day Dx E11.9 400 each 11  . HYDROcodone-homatropine (HYCODAN) 5-1.5 MG/5ML syrup Take 5 mLs by mouth every 6 (six) hours as needed for cough. 180 mL 0  . levalbuterol (XOPENEX HFA) 45 MCG/ACT inhaler Inhale 1-2 puffs into the lungs 2 (two) times daily. 15 g 11  . methotrexate (RHEUMATREX) 2.5 MG tablet Take 10 mg by mouth every Friday.     . naproxen (NAPROSYN) 500 MG tablet TAKE 1 TABLET BY MOUTH TWICE DAILY WITH A MEAL 60 tablet 5  . ondansetron (ZOFRAN) 4 MG tablet Take 1 tablet (4 mg total) by mouth every 8 (eight) hours as needed for nausea or vomiting. 15 tablet 0  . oxyCODONE-acetaminophen (PERCOCET/ROXICET) 5-325 MG tablet Take 1 tablet by mouth daily as needed. For pain  0  . predniSONE (DELTASONE) 5 MG tablet Take 1 tablet (5 mg total) by mouth daily with breakfast. When current prednisone pulse of 2m dosing ends 60 tablet 6  . sertraline (ZOLOFT) 100 MG tablet 2 tabs by mouth per day (Patient taking differently: Take 200 mg by mouth daily. ) 180 tablet 3  . SUMAtriptan (IMITREX) 100 MG tablet Take 1 tablet (100 mg total) by mouth every 2 (two) hours as needed for migraine. May repeat in 2 hours if headache persists or recurs. 10 tablet 11  . telmisartan (MICARDIS) 40 MG tablet Take 1 tablet (40 mg total) by mouth daily. 90 tablet 3  . topiramate (TOPAMAX) 50 MG tablet Take 1 tablet (50 mg total) by mouth 2 (two) times daily. 60 tablet 5  . vitamin B-12 (CYANOCOBALAMIN) 1000 MCG tablet Take 1,000 mcg by mouth daily.    . benzonatate (TESSALON) 200 MG capsule Take 1 capsule (200 mg total) by mouth 3 (three) times daily as needed for cough. 30 capsule 1  . doxycycline  (VIBRA-TABS) 100 MG tablet Take 1 tablet (100 mg total) by mouth 2 (two) times daily. 20 tablet 0  . guaifenesin (ROBITUSSIN) 100 MG/5ML syrup Take 200 mg by mouth 3 (three) times daily as needed for cough.    . nystatin (MYCOSTATIN) 100000 UNIT/ML suspension Take 5 mLs (500,000 Units total) by mouth 4 (four) times daily. 180 mL 0  .  predniSONE (DELTASONE) 10 MG tablet 4tab by mouth x3day,3tab x3day,2tab x 3day,1tab x 3day 30 tablet 0  . Sodium Chloride-Sodium Bicarb (NETI POT SINUS WASH) 2300-700 MG KIT Place into the nose.     No facility-administered medications prior to visit.          Objective:   Physical Exam  Filed Vitals:   09/23/15 0944 09/23/15 0950  BP:  110/84  Pulse:  83  Height: '5\' 6"'  (1.676 m)   Weight: 324 lb (146.965 kg)   SpO2:  93%   Gen: Pleasant, overweight, in no distress,  normal affect  ENT: No lesions,  mouth clear,  oropharynx clear, no postnasal drip  Neck: No JVD, no stridor  Lungs: No use of accessory muscles, clear without rales or rhonchi, no wheeze on forced expiration  Cardiovascular: RRR, heart sounds normal, no murmur or gallops, no peripheral edema  Musculoskeletal: No deformities, no cyanosis or clubbing  Neuro: alert, non focal  Skin: Warm, no lesions or rashes     Assessment & Plan:  Allergic rhinitis Add back fluticasone nasal spray to her daily Zyrtec. She has had some difficulty tolerating this in the past. If the fluticasone irritates her nose and we will look for an alternative. She might also benefit from nasal saline washes  PULMONARY SARCOIDOSIS Pulmonary function testing today shows stable FEV1, no evidence of overt obstruction, no bronchodilator response. Suspect that she does have an asthmatic tendency when she flares or has an upper respiratory infection. I do not want to restart Symbicort at this time. We will perform a CT scan of her chest to compare with 2011. Believe she also needs a sputum culture for bacterial,  fungal and AFB given her micronodular disease on the old CT scan and her persistent cough and mucus production.

## 2015-09-23 NOTE — Progress Notes (Signed)
PFT done today. 

## 2015-09-25 ENCOUNTER — Other Ambulatory Visit: Payer: Self-pay

## 2015-10-01 ENCOUNTER — Telehealth: Payer: Self-pay | Admitting: Internal Medicine

## 2015-10-01 NOTE — Telephone Encounter (Signed)
Called in states last FMLA forms that were last completed has the incorrect end date.  End date was marked at the 10th and should be the 11th.  Please call back as soon as possible in regards to this.

## 2015-10-04 ENCOUNTER — Emergency Department (HOSPITAL_COMMUNITY)
Admission: EM | Admit: 2015-10-04 | Discharge: 2015-10-04 | Disposition: A | Discharge status: Home / Self Care | Payer: BLUE CROSS/BLUE SHIELD | Attending: Emergency Medicine | Admitting: Emergency Medicine

## 2015-10-04 ENCOUNTER — Emergency Department (HOSPITAL_COMMUNITY): Payer: BLUE CROSS/BLUE SHIELD

## 2015-10-04 ENCOUNTER — Encounter (HOSPITAL_COMMUNITY): Payer: Self-pay | Admitting: Emergency Medicine

## 2015-10-04 DIAGNOSIS — M5441 Lumbago with sciatica, right side: Secondary | ICD-10-CM | POA: Diagnosis not present

## 2015-10-04 DIAGNOSIS — Z8742 Personal history of other diseases of the female genital tract: Secondary | ICD-10-CM | POA: Diagnosis not present

## 2015-10-04 DIAGNOSIS — Z8701 Personal history of pneumonia (recurrent): Secondary | ICD-10-CM | POA: Insufficient documentation

## 2015-10-04 DIAGNOSIS — D649 Anemia, unspecified: Secondary | ICD-10-CM | POA: Diagnosis not present

## 2015-10-04 DIAGNOSIS — I10 Essential (primary) hypertension: Secondary | ICD-10-CM | POA: Diagnosis not present

## 2015-10-04 DIAGNOSIS — F419 Anxiety disorder, unspecified: Secondary | ICD-10-CM | POA: Insufficient documentation

## 2015-10-04 DIAGNOSIS — Z87891 Personal history of nicotine dependence: Secondary | ICD-10-CM | POA: Diagnosis not present

## 2015-10-04 DIAGNOSIS — Z880 Allergy status to penicillin: Secondary | ICD-10-CM | POA: Insufficient documentation

## 2015-10-04 DIAGNOSIS — Z7952 Long term (current) use of systemic steroids: Secondary | ICD-10-CM | POA: Diagnosis not present

## 2015-10-04 DIAGNOSIS — E119 Type 2 diabetes mellitus without complications: Secondary | ICD-10-CM | POA: Diagnosis not present

## 2015-10-04 DIAGNOSIS — Z8669 Personal history of other diseases of the nervous system and sense organs: Secondary | ICD-10-CM | POA: Diagnosis not present

## 2015-10-04 DIAGNOSIS — Z791 Long term (current) use of non-steroidal anti-inflammatories (NSAID): Secondary | ICD-10-CM | POA: Insufficient documentation

## 2015-10-04 DIAGNOSIS — Z79899 Other long term (current) drug therapy: Secondary | ICD-10-CM | POA: Insufficient documentation

## 2015-10-04 DIAGNOSIS — G43909 Migraine, unspecified, not intractable, without status migrainosus: Secondary | ICD-10-CM | POA: Insufficient documentation

## 2015-10-04 DIAGNOSIS — Z8711 Personal history of peptic ulcer disease: Secondary | ICD-10-CM | POA: Insufficient documentation

## 2015-10-04 DIAGNOSIS — M1990 Unspecified osteoarthritis, unspecified site: Secondary | ICD-10-CM | POA: Diagnosis not present

## 2015-10-04 DIAGNOSIS — F329 Major depressive disorder, single episode, unspecified: Secondary | ICD-10-CM | POA: Diagnosis not present

## 2015-10-04 DIAGNOSIS — Z8719 Personal history of other diseases of the digestive system: Secondary | ICD-10-CM | POA: Insufficient documentation

## 2015-10-04 DIAGNOSIS — G8929 Other chronic pain: Secondary | ICD-10-CM | POA: Insufficient documentation

## 2015-10-04 DIAGNOSIS — M545 Low back pain: Secondary | ICD-10-CM | POA: Diagnosis present

## 2015-10-04 MED ORDER — OXYCODONE-ACETAMINOPHEN 5-325 MG PO TABS: 1.0000 | ORAL_TABLET | Freq: Every day | ORAL | Status: DC | PRN

## 2015-10-04 NOTE — ED Notes (Signed)
MRI called and are arranging for transport for pt, pt is not claustrophobic.

## 2015-10-04 NOTE — ED Provider Notes (Signed)
CSN: [id number]     Arrival date & time 10/04/15  5681 History   First MD Initiated Contact with Patient 10/04/15 [phone]     Chief Complaint  Patient presents with  . Hip Pain     (Consider location/radiation/quality/duration/timing/severity/associated sxs/prior Treatment) HPI   Nicole Melendez is a 39 y.o. female who presents for evaluation of right low back pain radiating to right leg. Symptoms worsening since injection to lumbar facets, and week ago. She is using hydrocodone, and oxycodone, without relief. She denies bowel or urinary incontinence. No paresthesia of the legs. She has chronic low back pain. Her procedure was performed by Dr. Lovenia Shuck, as an outpatient. No fever, chills, cough, shortness breath or chest pain. There are no other no modifying factors.   Past Medical History  Diagnosis Date  . Morbid obesity ([place])   . Peptic ulcer disease   . GERD (gastroesophageal reflux disease)   . Migraine   . Anxiety   . Anemia   . Hyperlipidemia   . Menorrhagia   . PNA (pneumonia) july 2011  . Depression   . Sarcoid ([place])     including hand per rheumatology-Dr. Amil Amen  . Allergic rhinitis, cause unspecified   . Varicose veins with pain   . Positive ANA (antinuclear antibody) 02/14/2012  . Shortness of breath     on exertion  . History of blood transfusion   . Sarcoidosis of lung ([place])   . Otitis media 06/28/2015  . Essential hypertension 08/08/2015  . Diabetes mellitus type II     steroid related, patient states "im no longer diabetic"   Past Surgical History  Procedure Laterality Date  . Uterine ablation  03/2010  . Wisdom tooth extraction    . Abdominal hysterectomy     Family History  Problem Relation Age of Onset  . Diabetes Father   . Hypertension    . Allergies Mother   . Heart attack Mother   . Heart disease Father   . Rheum arthritis Father   . Ovarian cancer Maternal Aunt   . Lung cancer Maternal Aunt   . Breast cancer Maternal Aunt   . Bone cancer  Maternal Aunt   . Other Neg Hx   . Diabetes Mother    Social History  Substance Use Topics  . Smoking status: Former Smoker -- 1.00 packs/day for 20 years    Types: Cigarettes    Quit date: 10/25/2012  . Smokeless tobacco: None     Comment: QUIT 04/2010 AND STARTED BACK 2014 X 3 MONTHS. less than 1 ppd.  started at age 59.    Marland Kitchen Alcohol Use: No   OB History    No data available     Review of Systems  All other systems reviewed and are negative.     Allergies  Azithromycin; Penicillins; Shellfish allergy; Klonopin; and Bee venom  Home Medications   Prior to Admission medications   Medication Sig Start Date End Date Taking? Authorizing Provider  ALPRAZolam (XANAX) 0.5 MG tablet TAKE 1 TABLET BY MOUTH THREE TIMES DAILY AS NEEDED Patient taking differently: TAKE 1 TABLET BY MOUTH THREE TIMES DAILY AS NEEDED FOR ANXIETY. 08/27/15  Yes Biagio Borg, MD  B-D UF III MINI PEN NEEDLES 31G X 5 MM MISC USE FOUR TIMES DAILY FOR SLIDING SCALE AS NEEDED   Yes Biagio Borg, MD  Blood Glucose Monitoring Suppl (ONE TOUCH ULTRA 2) W/DEVICE KIT Use as directed four times daily. Dx: E11.9 08/11/15  Yes Hilliard Clark  Loanne Drilling, MD  budesonide-formoterol [facility]) 160-4.5 MCG/ACT inhaler Inhale 2 puffs into the lungs 2 (two) times daily. Patient taking differently: Inhale 2 puffs into the lungs 2 (two) times daily as needed (SHORTNESS OF BREATH).  10/17/14  Yes Biagio Borg, MD  cetirizine (ZYRTEC) 10 MG tablet TAKE 1 TABLET BY MOUTH EVERY DAY 03/18/15  Yes Biagio Borg, MD  cholecalciferol (VITAMIN D) 1000 UNITS tablet Take 5,000 Units by mouth daily.   Yes Historical Provider, MD  cyclobenzaprine (FLEXERIL) 5 MG tablet Take 1 tablet (5 mg total) by mouth at bedtime as needed for muscle spasms. 10/17/14  Yes Biagio Borg, MD  folic acid (FOLVITE) 1 MG tablet Take 1 tablet (1 mg total) by mouth daily. 07/02/15  Yes Tammy S Parrett, NP  glucose blood (ONE TOUCH ULTRA TEST) test strip Use to check blood sugars four  times a day Dx E11.9 08/11/15  Yes Renato Shin, MD  HYDROmorphone (DILAUDID) 4 MG tablet Take 4 mg by mouth every 4 (four) hours as needed for severe pain.   Yes Historical Provider, MD  levalbuterol (XOPENEX HFA) 45 MCG/ACT inhaler Inhale 1-2 puffs into the lungs 2 (two) times daily. Patient taking differently: Inhale 1-2 puffs into the lungs every 8 (eight) hours as needed for wheezing or shortness of breath.  10/17/14  Yes Biagio Borg, MD  methotrexate (RHEUMATREX) 2.5 MG tablet Take 10 mg by mouth every Wednesday.    Yes Historical Provider, MD  nabumetone (RELAFEN) 500 MG tablet Take 500 mg by mouth 2 (two) times daily.   Yes Historical Provider, MD  ondansetron (ZOFRAN) 4 MG tablet Take 1 tablet (4 mg total) by mouth every 8 (eight) hours as needed for nausea or vomiting. 06/10/15  Yes Biagio Borg, MD  predniSONE (DELTASONE) 5 MG tablet Take 1 tablet (5 mg total) by mouth daily with breakfast. When current prednisone pulse of 75m dosing ends 06/21/15  Yes PElsie Stain MD  sertraline (ZOLOFT) 100 MG tablet 2 tabs by mouth per day Patient taking differently: Take 200 mg by mouth daily.  10/17/14  Yes JBiagio Borg MD  SUMAtriptan (IMITREX) 100 MG tablet Take 1 tablet (100 mg total) by mouth every 2 (two) hours as needed for migraine. May repeat in 2 hours if headache persists or recurs. 08/13/15  Yes JBiagio Borg MD  telmisartan (MICARDIS) 40 MG tablet Take 1 tablet (40 mg total) by mouth daily. 08/07/15  Yes JBiagio Borg MD  vitamin B-12 (CYANOCOBALAMIN) 1000 MCG tablet Take 1,000 mcg by mouth daily.   Yes Historical Provider, MD  oxyCODONE-acetaminophen (PERCOCET/ROXICET) 5-325 MG tablet Take 1 tablet by mouth daily as needed for moderate pain. For pain 10/04/15   EDaleen Bo MD   BP 140/80 mmHg  Pulse 76  Temp(Src) 98.1 F (36.7 C) (Oral)  Resp 18  Ht 5' 7" (1.702 m)  Wt 318 lb (144.244 kg)  BMI 49.79 kg/m2  SpO2 99%  LMP 07/17/2012 Physical Exam  Constitutional: She is  oriented to person, place, and time. She appears well-developed.  Obese  HENT:  Head: Normocephalic and atraumatic.  Right Ear: External ear normal.  Left Ear: External ear normal.  Eyes: Conjunctivae and EOM are normal. Pupils are equal, round, and reactive to light.  Neck: Normal range of motion and phonation normal. Neck supple.  Cardiovascular: Normal rate, regular rhythm and normal heart sounds.   Pulmonary/Chest: Effort normal and breath sounds normal. She exhibits no bony tenderness.  Musculoskeletal:  Normal range of motion.  Tender right lower lumbar without associated mass, swelling or deformity. No visible puncture sites, and no bleeding.  Neurological: She is alert and oriented to person, place, and time. No cranial nerve deficit or sensory deficit. She exhibits normal muscle tone. Coordination normal.  Skin: Skin is warm, dry and intact.  Psychiatric: She has a normal mood and affect. Her behavior is normal. Judgment and thought content normal.  Nursing note and vitals reviewed.   ED Course  Procedures (including critical care time)  Medications - No data to display  No data found.  MRI ordered to evaluate for complications from recent injection, or progression of chronic back disorder.   At Discharge Reevaluation with update and discussion. After initial assessment and treatment, an updated evaluation reveals she remains comfortable, all questions answered. [place] Review Labs Reviewed - No data to display  Imaging Review Ct Chest High Resolution  10/06/2015  CLINICAL DATA:  Sarcoid, on daily prednisone. EXAM: CT CHEST WITHOUT CONTRAST TECHNIQUE: Multidetector CT imaging of the chest was performed following the standard protocol without intravenous contrast. High resolution imaging of the lungs, as well as inspiratory and expiratory imaging, was performed. COMPARISON:  05/13/2010. FINDINGS: Mediastinum/Lymph Nodes: Mediastinal lymph nodes are not  enlarged by CT size criteria. Hilar regions are difficult to definitively evaluate without IV contrast. No axillary adenopathy. Heart size normal. No pericardial effusion. Lungs/Pleura: Image quality is somewhat degraded by body habitus. Mild patchy upper and midlung zone predominant ground-glass with slight architectural distortion. A corresponding acute infectious/ inflammatory process was seen in the upper/ mid lung zones on 05/13/2010. No pleural fluid. Airway is unremarkable. Upper abdomen: Visualized portions of the liver, left adrenal gland, left kidney, spleen and pancreas are grossly unremarkable. Postoperative changes in the stomach. Musculoskeletal: No worrisome lytic or sclerotic lesions. IMPRESSION: 1. Upper/midlung zone predominant ground-glass and mild architectural distortion may represent the residua of acute changes of sarcoid seen on 05/13/2010. Post infectious scarring can also have this appearance. 2. Mediastinal lymph nodes are within normal limits. Electronically Signed   By: Lorin Picket M.D.   On: 10/06/2015 12:07   I have personally reviewed and evaluated these images and lab results as part of my medical decision-making.   EKG Interpretation None      MDM   Final diagnoses:  Right-sided low back pain with right-sided sciatica  Osteoarthritis, unspecified osteoarthritis type, unspecified site    Nonspecific back pain.  MRI is reassuring. No suspected fracture. Doubt spinal myelopathy.    Nursing Notes Reviewed/ Care Coordinated Applicable Imaging Reviewed Interpretation of Laboratory Data incorporated into ED treatment  The patient appears reasonably screened and/or stabilized for discharge and I doubt any other medical condition or other [facility] requiring further screening, evaluation, or treatment in the ED at this time prior to discharge.  Plan: Home Medications- usual; Home Treatments- rest; return here if the recommended treatment, does not improve the  symptoms; Recommended follow up- PCP and Back doctor prn     Daleen Bo, MD 10/06/15 1743

## 2015-10-04 NOTE — ED Notes (Signed)
Patient left at this time with all belongings. 

## 2015-10-04 NOTE — Discharge Instructions (Signed)
Arthritis Arthritis is a term that is commonly used to refer to joint pain or joint disease. There are more than 100 types of arthritis. CAUSES The most common cause of this condition is wear and tear of a joint. Other causes include:  Gout.  Inflammation of a joint.  An infection of a joint.  Sprains and other injuries near the joint.  A drug reaction or allergic reaction. In some cases, the cause may not be known. SYMPTOMS The main symptom of this condition is pain in the joint with movement. Other symptoms include:  Redness, swelling, or stiffness at a joint.  Warmth coming from the joint.  Fever.  Overall feeling of illness. DIAGNOSIS This condition may be diagnosed with a physical exam and tests, including:  Blood tests.  Urine tests.  Imaging tests, such as MRI, X-rays, or a CT scan. Sometimes, fluid is removed from a joint for testing. TREATMENT Treatment for this condition may involve:  Treatment of the cause, if it is known.  Rest.  Raising (elevating) the joint.  Applying cold or hot packs to the joint.  Medicines to improve symptoms and reduce inflammation.  Injections of a steroid such as cortisone into the joint to help reduce pain and inflammation. Depending on the cause of your arthritis, you may need to make lifestyle changes to reduce stress on your joint. These changes may include exercising more and losing weight. HOME CARE INSTRUCTIONS Medicines  Take over-the-counter and prescription medicines only as told by your health care provider.  Do not take aspirin to relieve pain if gout is suspected. Activities  Rest your joint if told by your health care provider. Rest is important when your disease is active and your joint feels painful, swollen, or stiff.  Avoid activities that make the pain worse. It is important to balance activity with rest.  Exercise your joint regularly with range-of-motion exercises as told by your health care  provider. Try doing low-impact exercise, such as:  Swimming.  Water aerobics.  Biking.  Walking. Joint Care  If your joint is swollen, keep it elevated if told by your health care provider.  If your joint feels stiff in the morning, try taking a warm shower.  If directed, apply heat to the joint. If you have diabetes, do not apply heat without permission from your health care provider.  Put a towel between the joint and the hot pack or heating pad.  Leave the heat on the area for 20-30 minutes.  If directed, apply ice to the joint:  Put ice in a plastic bag.  Place a towel between your skin and the bag.  Leave the ice on for 20 minutes, 2-3 times per day.  Keep all follow-up visits as told by your health care provider. This is important. SEEK MEDICAL CARE IF:  The pain gets worse.  You have a fever. SEEK IMMEDIATE MEDICAL CARE IF:  You develop severe joint pain, swelling, or redness.  Many joints become painful and swollen.  You develop severe back pain.  You develop severe weakness in your leg.  You cannot control your bladder or bowels.   This information is not intended to replace advice given to you by your health care provider. Make sure you discuss any questions you have with your health care provider.   Document Released: 11/18/2004 Document Revised: 07/02/2015 Document Reviewed: 01/06/2015 Elsevier Interactive Patient Education 2016 [place] therapy can help ease sore, stiff, injured, and tight muscles and  joints. Heat relaxes your muscles, which may help ease your pain.  RISKS AND COMPLICATIONS If you have any of the following conditions, do not use heat therapy unless your health care provider has approved:  Poor circulation.  Healing wounds or scarred skin in the area being treated.  Diabetes, heart disease, or high blood pressure.  Not being able to feel (numbness) the area being treated.  Unusual swelling of the  area being treated.  Active infections.  Blood clots.  Cancer.  Inability to communicate pain. This may include young children and people who have problems with their brain function (dementia).  Pregnancy. Heat therapy should only be used on old, pre-existing, or long-lasting (chronic) injuries. Do not use heat therapy on new injuries unless directed by your health care provider. HOW TO USE HEAT THERAPY There are several different kinds of heat therapy, including:  Moist heat pack.  Warm water bath.  Hot water bottle.  Electric heating pad.  Heated gel pack.  Heated wrap.  Electric heating pad. Use the heat therapy method suggested by your health care provider. Follow your health care provider's instructions on when and how to use heat therapy. GENERAL HEAT THERAPY RECOMMENDATIONS  Do not sleep while using heat therapy. Only use heat therapy while you are awake.  Your skin may turn pink while using heat therapy. Do not use heat therapy if your skin turns red.  Do not use heat therapy if you have new pain.  High heat or long exposure to heat can cause burns. Be careful when using heat therapy to avoid burning your skin.  Do not use heat therapy on areas of your skin that are already irritated, such as with a rash or sunburn. SEEK MEDICAL CARE IF:  You have blisters, redness, swelling, or numbness.  You have new pain.  Your pain is worse. MAKE SURE YOU:  Understand these instructions.  Will watch your condition.  Will get help right away if you are not doing well or get worse.   This information is not intended to replace advice given to you by your health care provider. Make sure you discuss any questions you have with your health care provider.   Document Released: 01/03/2012 Document Revised: 11/01/2014 Document Reviewed: 12/04/2013 Elsevier Interactive Patient Education [employer].

## 2015-10-04 NOTE — ED Notes (Signed)
Patient states on Monday she had back surgery done, "the less invasive kind", and pain has been increasing on right side from hip down to right leg.

## 2015-10-06 ENCOUNTER — Ambulatory Visit
Admission: RE | Admit: 2015-10-06 | Discharge: 2015-10-06 | Disposition: A | Discharge status: Home / Self Care | Payer: BLUE CROSS/BLUE SHIELD | Source: Ambulatory Visit | Attending: Emergency Medicine | Admitting: Emergency Medicine

## 2015-10-06 DIAGNOSIS — D869 Sarcoidosis, unspecified: Secondary | ICD-10-CM

## 2015-10-07 ENCOUNTER — Ambulatory Visit: Payer: Self-pay | Admitting: Endocrinology

## 2015-10-08 DIAGNOSIS — Z7689 Persons encountering health services in other specified circumstances: Secondary | ICD-10-CM

## 2015-10-08 NOTE — Telephone Encounter (Signed)
Called patient back left vm. Advised via vm not sure which dates that she is referring to. The FMLA paperwork that i filled out did not ask for a specific date range. We did issue a letter dated 08/05/2015 stating that she was cleared to go back to work. i also received a new packet from her.will await her call back./

## 2015-10-10 ENCOUNTER — Ambulatory Visit: Payer: Self-pay | Admitting: Endocrinology

## 2015-10-15 ENCOUNTER — Encounter: Payer: Self-pay | Admitting: Emergency Medicine

## 2015-10-15 ENCOUNTER — Emergency Department (HOSPITAL_COMMUNITY)
Admission: EM | Admit: 2015-10-15 | Discharge: 2015-10-15 | Disposition: A | Discharge status: Home / Self Care | Payer: BLUE CROSS/BLUE SHIELD | Attending: Emergency Medicine | Admitting: Emergency Medicine

## 2015-10-15 ENCOUNTER — Encounter (HOSPITAL_COMMUNITY): Payer: Self-pay | Admitting: Emergency Medicine

## 2015-10-15 DIAGNOSIS — Z87891 Personal history of nicotine dependence: Secondary | ICD-10-CM | POA: Diagnosis not present

## 2015-10-15 DIAGNOSIS — Z8719 Personal history of other diseases of the digestive system: Secondary | ICD-10-CM | POA: Diagnosis not present

## 2015-10-15 DIAGNOSIS — D649 Anemia, unspecified: Secondary | ICD-10-CM | POA: Insufficient documentation

## 2015-10-15 DIAGNOSIS — Z8711 Personal history of peptic ulcer disease: Secondary | ICD-10-CM | POA: Insufficient documentation

## 2015-10-15 DIAGNOSIS — F329 Major depressive disorder, single episode, unspecified: Secondary | ICD-10-CM | POA: Insufficient documentation

## 2015-10-15 DIAGNOSIS — Z8709 Personal history of other diseases of the respiratory system: Secondary | ICD-10-CM | POA: Insufficient documentation

## 2015-10-15 DIAGNOSIS — Z8701 Personal history of pneumonia (recurrent): Secondary | ICD-10-CM | POA: Insufficient documentation

## 2015-10-15 DIAGNOSIS — Z79899 Other long term (current) drug therapy: Secondary | ICD-10-CM | POA: Insufficient documentation

## 2015-10-15 DIAGNOSIS — Z7951 Long term (current) use of inhaled steroids: Secondary | ICD-10-CM | POA: Insufficient documentation

## 2015-10-15 DIAGNOSIS — I10 Essential (primary) hypertension: Secondary | ICD-10-CM | POA: Insufficient documentation

## 2015-10-15 DIAGNOSIS — Z7952 Long term (current) use of systemic steroids: Secondary | ICD-10-CM | POA: Insufficient documentation

## 2015-10-15 DIAGNOSIS — Z880 Allergy status to penicillin: Secondary | ICD-10-CM | POA: Diagnosis not present

## 2015-10-15 DIAGNOSIS — F419 Anxiety disorder, unspecified: Secondary | ICD-10-CM | POA: Insufficient documentation

## 2015-10-15 DIAGNOSIS — Z8742 Personal history of other diseases of the female genital tract: Secondary | ICD-10-CM | POA: Diagnosis not present

## 2015-10-15 DIAGNOSIS — M25552 Pain in left hip: Secondary | ICD-10-CM

## 2015-10-15 DIAGNOSIS — E119 Type 2 diabetes mellitus without complications: Secondary | ICD-10-CM | POA: Insufficient documentation

## 2015-10-15 NOTE — ED Notes (Signed)
See PA assessment 

## 2015-10-15 NOTE — Discharge Instructions (Signed)

## 2015-10-15 NOTE — ED Notes (Signed)
Pt recently seen here for right hip pain. Pt had MRI done on 10/04/15. Pt states she needs to find an orthopedic doctor. Pt reports she was traveling up some stairs on Monday and her hip popped and since she has been in pain. Pt in NAD. Pt reports taking "hydromorphine" at home for pain.

## 2015-10-15 NOTE — Telephone Encounter (Signed)
RB please advise. Thanks.  

## 2015-10-15 NOTE — ED Provider Notes (Signed)
CSN: [id number]     Arrival date & time 10/15/15  8841 History  By signing my name below, I, Nicole Melendez, attest that this documentation has been prepared under the direction and in the presence of [employer], PA-C. Electronically Signed: Starleen Melendez ED Scribe. 10/15/2015. 10:33 AM.   Chief Complaint  Patient presents with  . Hip Pain   The history is provided by the patient. No language interpreter was used.   HPI Comments: Nicole Melendez is a 39 y.o. female with hx of HTN, sarcoidosis, RA (methotrexate, prednisone) who presents to the Emergency Department complaining of new, worsening, severe left hip pain radiating down the left leg onset two days ago after "popping" while walking up the stairs.  She has used hydromorphone, Tylenol, and ibuprofen with some relief.   Patient is able to walk with a limp.  She denies knee pain.    Past Medical History  Diagnosis Date  . Morbid obesity ([place])   . Peptic ulcer disease   . GERD (gastroesophageal reflux disease)   . Migraine   . Anxiety   . Anemia   . Hyperlipidemia   . Menorrhagia   . PNA (pneumonia) july 2011  . Depression   . Sarcoid ([place])     including hand per rheumatology-Dr. Amil Amen  . Allergic rhinitis, cause unspecified   . Varicose veins with pain   . Positive ANA (antinuclear antibody) 02/14/2012  . Shortness of breath     on exertion  . History of blood transfusion   . Sarcoidosis of lung ([place])   . Otitis media 06/28/2015  . Essential hypertension 08/08/2015  . Diabetes mellitus type II     steroid related, patient states "im no longer diabetic"   Past Surgical History  Procedure Laterality Date  . Uterine ablation  03/2010  . Wisdom tooth extraction    . Abdominal hysterectomy     Family History  Problem Relation Age of Onset  . Diabetes Father   . Hypertension    . Allergies Mother   . Heart attack Mother   . Heart disease Father   . Rheum arthritis Father   . Ovarian cancer Maternal Aunt   . Lung  cancer Maternal Aunt   . Breast cancer Maternal Aunt   . Bone cancer Maternal Aunt   . Other Neg Hx   . Diabetes Mother    Social History  Substance Use Topics  . Smoking status: Former Smoker -- 1.00 packs/day for 20 years    Types: Cigarettes    Quit date: 10/25/2012  . Smokeless tobacco: None     Comment: QUIT 04/2010 AND STARTED BACK 2014 X 3 MONTHS. less than 1 ppd.  started at age 23.    Marland Kitchen Alcohol Use: No   OB History    No data available     Review of Systems  Constitutional: Negative for fever.  Musculoskeletal: Positive for arthralgias.     Allergies  Azithromycin; Penicillins; Shellfish allergy; Klonopin; and Bee venom  Home Medications   Prior to Admission medications   Medication Sig Start Date End Date Taking? Authorizing Provider  acetaminophen (TYLENOL) 500 MG tablet Take 1,000 mg by mouth every 6 (six) hours as needed for mild pain or moderate pain.   Yes Historical Provider, MD  ALPRAZolam (XANAX) 0.5 MG tablet TAKE 1 TABLET BY MOUTH THREE TIMES DAILY AS NEEDED Patient taking differently: TAKE 1 TABLET BY MOUTH THREE TIMES DAILY AS NEEDED FOR ANXIETY. 08/27/15  Yes Hunt Oris  Jenny Reichmann, MD  B-D UF III MINI PEN NEEDLES 31G X 5 MM MISC USE FOUR TIMES DAILY FOR SLIDING SCALE AS NEEDED   Yes Biagio Borg, MD  Blood Glucose Monitoring Suppl (ONE TOUCH ULTRA 2) W/DEVICE KIT Use as directed four times daily. Dx: E11.9 08/11/15  Yes Renato Shin, MD  budesonide-formoterol [facility]) 160-4.5 MCG/ACT inhaler Inhale 2 puffs into the lungs 2 (two) times daily. Patient taking differently: Inhale 2 puffs into the lungs 2 (two) times daily as needed (SHORTNESS OF BREATH).  10/17/14  Yes Biagio Borg, MD  cetirizine (ZYRTEC) 10 MG tablet TAKE 1 TABLET BY MOUTH EVERY DAY 03/18/15  Yes Biagio Borg, MD  cholecalciferol (VITAMIN D) 1000 UNITS tablet Take 5,000 Units by mouth daily.   Yes Historical Provider, MD  cyclobenzaprine (FLEXERIL) 5 MG tablet Take 1 tablet (5 mg total) by mouth  at bedtime as needed for muscle spasms. 10/17/14  Yes Biagio Borg, MD  folic acid (FOLVITE) 1 MG tablet Take 1 tablet (1 mg total) by mouth daily. 07/02/15  Yes Tammy S Parrett, NP  glucose blood (ONE TOUCH ULTRA TEST) test strip Use to check blood sugars four times a day Dx E11.9 08/11/15  Yes Renato Shin, MD  HYDROmorphone (DILAUDID) 4 MG tablet Take 4 mg by mouth every 4 (four) hours as needed for severe pain.   Yes Historical Provider, MD  levalbuterol (XOPENEX HFA) 45 MCG/ACT inhaler Inhale 1-2 puffs into the lungs 2 (two) times daily. Patient taking differently: Inhale 1-2 puffs into the lungs every 8 (eight) hours as needed for wheezing or shortness of breath.  10/17/14  Yes Biagio Borg, MD  methotrexate (RHEUMATREX) 2.5 MG tablet Take 10 mg by mouth every Wednesday.    Yes Historical Provider, MD  nabumetone (RELAFEN) 500 MG tablet Take 500 mg by mouth 2 (two) times daily.   Yes Historical Provider, MD  predniSONE (DELTASONE) 5 MG tablet Take 1 tablet (5 mg total) by mouth daily with breakfast. When current prednisone pulse of '10mg'$  dosing ends 06/21/15  Yes Elsie Stain, MD  sertraline (ZOLOFT) 100 MG tablet 2 tabs by mouth per day Patient taking differently: Take 200 mg by mouth daily.  10/17/14  Yes Biagio Borg, MD  telmisartan (MICARDIS) 40 MG tablet Take 1 tablet (40 mg total) by mouth daily. 08/07/15  Yes Biagio Borg, MD  vitamin B-12 (CYANOCOBALAMIN) 1000 MCG tablet Take 1,000 mcg by mouth daily.   Yes Historical Provider, MD  ondansetron (ZOFRAN) 4 MG tablet Take 1 tablet (4 mg total) by mouth every 8 (eight) hours as needed for nausea or vomiting. Patient not taking: Reported on 10/15/2015 06/10/15   Biagio Borg, MD  oxyCODONE-acetaminophen (PERCOCET/ROXICET) 5-325 MG tablet Take 1 tablet by mouth daily as needed for moderate pain. For pain Patient not taking: Reported on 10/15/2015 10/04/15   Daleen Bo, MD  SUMAtriptan (IMITREX) 100 MG tablet Take 1 tablet (100 mg total)  by mouth every 2 (two) hours as needed for migraine. May repeat in 2 hours if headache persists or recurs. Patient not taking: Reported on 10/15/2015 08/13/15   Biagio Borg, MD   BP 159/81 mmHg  Pulse 107  Temp(Src) 97.9 F (36.6 C) (Oral)  Resp 24  Ht '5\' 7"'$  (1.702 m)  Wt 146.058 kg  BMI 50.42 kg/m2  SpO2 99%  LMP 07/17/2012 Physical Exam  Constitutional: She is oriented to person, place, and time. She appears well-developed and well-nourished. No distress.  HENT:  Head: Normocephalic and atraumatic.  Eyes: Conjunctivae and EOM are normal.  Neck: Neck supple. No tracheal deviation present.  Cardiovascular: Normal rate.   Pulmonary/Chest: Effort normal. No respiratory distress.  Musculoskeletal: Normal range of motion.  No leg shortening. Able to flex and extend the knee and at the hip. Mild reproducible tenderness to the left hip. Ambulatory with limping gait favoring right leg.  Neurological: She is alert and oriented to person, place, and time.  Skin: Skin is warm and dry.  Psychiatric: She has a normal mood and affect. Her behavior is normal.  Nursing note and vitals reviewed.   ED Course  Procedures (including critical care time)  DIAGNOSTIC STUDIES: Oxygen Saturation is 99% on RA, normal by my interpretation.    COORDINATION OF CARE:  10:24 AM Will provide orthopaedic referral for f/u and discharge with work note.    Labs Review Labs Reviewed - No data to display  Imaging Review No results found.   EKG Interpretation None      MDM   Final diagnoses:  Left hip pain  Patient presents for left hip pain. She was seen for back pain and right hip pain 11 days ago. She had MRI of her spine at that time and there were no concerns for fracture or spinal myelopathy . She followed up with spine specialist who gave her steroid injections. She states that now the left side is hurting. She has a history of rheumatoid arthritis and sarcoidosis but I do not suspect that  this is a septic joint. She is ambulatory and refused pain medication. She is requesting orthopedic referral because her spine specialist told her that he cannot treat his pain. Pt advised to follow up with orthopedics if symptoms persist. She is currently taking her leftover hydromorphone for pain. I discussed not taking this while working. She also requested a work note so that she can return to work. Return precautions discussed. Patient will be dc home & is agreeable with above plan.  I personally performed the services described in this documentation, which was scribed in my presence. The recorded information has been reviewed and is accurate.   Ottie Glazier, PA-C 10/15/15 1120  Julianne Rice, MD 10/19/15 [phone]

## 2015-10-22 ENCOUNTER — Other Ambulatory Visit: Payer: Self-pay | Admitting: Internal Medicine

## 2015-10-26 ENCOUNTER — Emergency Department (INDEPENDENT_AMBULATORY_CARE_PROVIDER_SITE_OTHER)
Admission: EM | Admit: 2015-10-26 | Discharge: 2015-10-26 | Disposition: A | Discharge status: Home / Self Care | Payer: BLUE CROSS/BLUE SHIELD | Source: Home / Self Care | Attending: Family Medicine | Admitting: Family Medicine

## 2015-10-26 ENCOUNTER — Encounter (HOSPITAL_COMMUNITY): Payer: Self-pay | Admitting: *Deleted

## 2015-10-26 DIAGNOSIS — M25552 Pain in left hip: Secondary | ICD-10-CM

## 2015-10-26 MED ORDER — TRAMADOL HCL 50 MG PO TABS: 50.0000 mg | ORAL_TABLET | Freq: Four times a day (QID) | ORAL | Status: DC | PRN

## 2015-10-26 NOTE — ED Provider Notes (Signed)
CSN: [id number]     Arrival date & time 10/26/15  1305 History   First MD Initiated Contact with Patient 10/26/15 1408     Chief Complaint  Patient presents with  . Hip Pain   (Consider location/radiation/quality/duration/timing/severity/associated sxs/prior Treatment) Patient is a 40 y.o. female presenting with hip pain. The history is provided by the patient.  Hip Pain This is a chronic problem. The current episode started more than 1 week ago (pt has been seen recently 12/21 in ER and 4d ago at [place] with mri on 1/9 sceduled, pt c/o pain not responding to Ibup, requesting pain meds, has had x-rays done.). The problem has been gradually worsening. Pertinent negatives include no abdominal pain.    Past Medical History  Diagnosis Date  . Morbid obesity ([place])   . Peptic ulcer disease   . GERD (gastroesophageal reflux disease)   . Migraine   . Anxiety   . Anemia   . Hyperlipidemia   . Menorrhagia   . PNA (pneumonia) july 2011  . Depression   . Sarcoid ([place])     including hand per rheumatology-Dr. Amil Amen  . Allergic rhinitis, cause unspecified   . Varicose veins with pain   . Positive ANA (antinuclear antibody) 02/14/2012  . Shortness of breath     on exertion  . History of blood transfusion   . Sarcoidosis of lung ([place])   . Otitis media 06/28/2015  . Essential hypertension 08/08/2015  . Diabetes mellitus type II     steroid related, patient states "im no longer diabetic"   Past Surgical History  Procedure Laterality Date  . Uterine ablation  03/2010  . Wisdom tooth extraction    . Abdominal hysterectomy     Family History  Problem Relation Age of Onset  . Diabetes Father   . Hypertension    . Allergies Mother   . Heart attack Mother   . Heart disease Father   . Rheum arthritis Father   . Ovarian cancer Maternal Aunt   . Lung cancer Maternal Aunt   . Breast cancer Maternal Aunt   . Bone cancer Maternal Aunt   . Other Neg Hx   . Diabetes Mother    Social  History  Substance Use Topics  . Smoking status: Former Smoker -- 1.00 packs/day for 20 years    Types: Cigarettes    Quit date: 10/25/2012  . Smokeless tobacco: None     Comment: QUIT 04/2010 AND STARTED BACK 2014 X 3 MONTHS. less than 1 ppd.  started at age 56.    Marland Kitchen Alcohol Use: No   OB History    No data available     Review of Systems  Constitutional: Negative.   Cardiovascular: Negative.   Gastrointestinal: Negative.  Negative for abdominal pain.  Genitourinary: Negative.   Musculoskeletal: Positive for joint swelling and gait problem. Negative for back pain.  Skin: Negative.   All other systems reviewed and are negative.   Allergies  Azithromycin; Penicillins; Shellfish allergy; Klonopin; and Bee venom  Home Medications   Prior to Admission medications   Medication Sig Start Date End Date Taking? Authorizing Provider  acetaminophen (TYLENOL) 500 MG tablet Take 1,000 mg by mouth every 6 (six) hours as needed for mild pain or moderate pain.    Historical Provider, MD  ALPRAZolam (XANAX) 0.5 MG tablet TAKE 1 TABLET BY MOUTH THREE TIMES DAILY AS NEEDED Patient taking differently: TAKE 1 TABLET BY MOUTH THREE TIMES DAILY AS NEEDED FOR ANXIETY. 08/27/15  Biagio Borg, MD  B-D UF III MINI PEN NEEDLES 31G X 5 MM MISC USE FOUR TIMES DAILY FOR SLIDING SCALE AS NEEDED    Biagio Borg, MD  Blood Glucose Monitoring Suppl (ONE TOUCH ULTRA 2) W/DEVICE KIT Use as directed four times daily. Dx: E11.9 08/11/15   Renato Shin, MD  budesonide-formoterol [facility]) 160-4.5 MCG/ACT inhaler Inhale 2 puffs into the lungs 2 (two) times daily. Patient taking differently: Inhale 2 puffs into the lungs 2 (two) times daily as needed (SHORTNESS OF BREATH).  10/17/14   Biagio Borg, MD  cetirizine (ZYRTEC) 10 MG tablet TAKE 1 TABLET BY MOUTH EVERY DAY 03/18/15   Biagio Borg, MD  cholecalciferol (VITAMIN D) 1000 UNITS tablet Take 5,000 Units by mouth daily.    Historical Provider, MD  cyclobenzaprine  (FLEXERIL) 5 MG tablet Take 1 tablet (5 mg total) by mouth at bedtime as needed for muscle spasms. 10/17/14   Biagio Borg, MD  folic acid (FOLVITE) 1 MG tablet Take 1 tablet (1 mg total) by mouth daily. 07/02/15   Tammy S Parrett, NP  glucose blood (ONE TOUCH ULTRA TEST) test strip Use to check blood sugars four times a day Dx E11.9 08/11/15   Renato Shin, MD  HYDROmorphone (DILAUDID) 4 MG tablet Take 4 mg by mouth every 4 (four) hours as needed for severe pain.    Historical Provider, MD  levalbuterol [facility] HFA) 45 MCG/ACT inhaler Inhale 1-2 puffs into the lungs 2 (two) times daily. Patient taking differently: Inhale 1-2 puffs into the lungs every 8 (eight) hours as needed for wheezing or shortness of breath.  10/17/14   Biagio Borg, MD  methotrexate (RHEUMATREX) 2.5 MG tablet Take 10 mg by mouth every Wednesday.     Historical Provider, MD  nabumetone (RELAFEN) 500 MG tablet Take 500 mg by mouth 2 (two) times daily.    Historical Provider, MD  ondansetron (ZOFRAN) 4 MG tablet Take 1 tablet (4 mg total) by mouth every 8 (eight) hours as needed for nausea or vomiting. Patient not taking: Reported on 10/15/2015 06/10/15   Biagio Borg, MD  oxyCODONE-acetaminophen (PERCOCET/ROXICET) 5-325 MG tablet Take 1 tablet by mouth daily as needed for moderate pain. For pain Patient not taking: Reported on 10/15/2015 10/04/15   Daleen Bo, MD  predniSONE (DELTASONE) 5 MG tablet Take 1 tablet (5 mg total) by mouth daily with breakfast. When current prednisone pulse of 63m dosing ends 06/21/15   PElsie Stain MD  sertraline (ZOLOFT) 100 MG tablet TAKE 2 TABLETS BY MOUTH DAILY 10/23/15   JBiagio Borg MD  SUMAtriptan (IMITREX) 100 MG tablet Take 1 tablet (100 mg total) by mouth every 2 (two) hours as needed for migraine. May repeat in 2 hours if headache persists or recurs. Patient not taking: Reported on 10/15/2015 08/13/15   JBiagio Borg MD  telmisartan (MICARDIS) 40 MG tablet Take 1 tablet (40 mg  total) by mouth daily. 08/07/15   JBiagio Borg MD  traMADol (ULTRAM) 50 MG tablet Take 1 tablet (50 mg total) by mouth every 6 (six) hours as needed. 10/26/15   JBilly Fischer MD  vitamin B-12 (CYANOCOBALAMIN) 1000 MCG tablet Take 1,000 mcg by mouth daily.    Historical Provider, MD   Meds Ordered and Administered this Visit  Medications - No data to display  BP 155/88 mmHg  Pulse 83  Temp(Src) 98.1 F (36.7 C) (Oral)  Resp 20  SpO2 98%  LMP 07/17/2012  No data found.   Physical Exam  Constitutional: She is oriented to person, place, and time. She appears well-developed and well-nourished.  Obesity.  Abdominal: Soft. Bowel sounds are normal.  Musculoskeletal: She exhibits tenderness.       Legs: Neurological: She is alert and oriented to person, place, and time.  Skin: Skin is warm and dry.  Nursing note and vitals reviewed.   ED Course  Procedures (including critical care time)  Labs Review Labs Reviewed - No data to display  Imaging Review No results found.   Visual Acuity Review  Right Eye Distance:   Left Eye Distance:   Bilateral Distance:    Right Eye Near:   Left Eye Near:    Bilateral Near:         MDM   1. Hip pain, acute, left        Billy Fischer, MD 10/26/15 1426

## 2015-10-26 NOTE — ED Notes (Signed)
Pt  Reports  l  Hip  Pain     -  Pt  Was   Seen   4  Days  By  gso          Orthopedic  Pa    She  Reports   She is  Scheduled  For  Mri  On  Jan 9  -  She  Reports  meds  Not  Helping     denys  Any  specefic  Injury

## 2015-10-26 NOTE — Discharge Instructions (Signed)
See your doctor as planned. °

## 2015-10-28 ENCOUNTER — Encounter: Payer: Self-pay | Admitting: Internal Medicine

## 2015-10-28 ENCOUNTER — Ambulatory Visit (INDEPENDENT_AMBULATORY_CARE_PROVIDER_SITE_OTHER): Payer: BLUE CROSS/BLUE SHIELD | Admitting: Internal Medicine

## 2015-10-28 VITALS — BP 126/82 | HR 88 | Temp 98.0°F | Ht 67.0 in | Wt 326.0 lb

## 2015-10-28 DIAGNOSIS — E785 Hyperlipidemia, unspecified: Secondary | ICD-10-CM

## 2015-10-28 DIAGNOSIS — Z23 Encounter for immunization: Secondary | ICD-10-CM

## 2015-10-28 DIAGNOSIS — Z0001 Encounter for general adult medical examination with abnormal findings: Secondary | ICD-10-CM

## 2015-10-28 DIAGNOSIS — M48061 Spinal stenosis, lumbar region without neurogenic claudication: Secondary | ICD-10-CM | POA: Insufficient documentation

## 2015-10-28 DIAGNOSIS — M5432 Sciatica, left side: Secondary | ICD-10-CM

## 2015-10-28 DIAGNOSIS — Z0000 Encounter for general adult medical examination without abnormal findings: Secondary | ICD-10-CM

## 2015-10-28 DIAGNOSIS — I10 Essential (primary) hypertension: Secondary | ICD-10-CM

## 2015-10-28 DIAGNOSIS — F411 Generalized anxiety disorder: Secondary | ICD-10-CM

## 2015-10-28 MED ORDER — GABAPENTIN 300 MG PO CAPS: 300.0000 mg | ORAL_CAPSULE | Freq: Three times a day (TID) | ORAL | Status: DC

## 2015-10-28 MED ORDER — KETOROLAC TROMETHAMINE 30 MG/ML IM SOLN
30.0000 mg | Freq: Once | INTRAMUSCULAR | Status: AC
  Administered 2015-10-28: 30 mg via INTRAMUSCULAR

## 2015-10-28 MED ORDER — CETIRIZINE HCL 10 MG PO TABS: 10.0000 mg | ORAL_TABLET | Freq: Every day | ORAL | Status: DC

## 2015-10-28 NOTE — Assessment & Plan Note (Signed)
stable overall by history and exam, recent data reviewed with pt, and pt to continue medical treatment as before,  to f/u any worsening symptoms or concerns Lab Results  Component Value Date   WBC 8.5 08/02/2015   HGB 12.6 08/02/2015   HCT 37.3 08/02/2015   PLT 267 08/02/2015   GLUCOSE 105* 08/02/2015   CHOL 210* 10/17/2014   TRIG 101.0 10/17/2014   HDL 58.30 10/17/2014   LDLDIRECT 135.0 07/05/2011   LDLCALC 132* 10/17/2014   ALT 13 07/29/2015   AST 15 07/29/2015   NA 136 08/02/2015   K 4.2 08/02/2015   CL 103 08/02/2015   CREATININE 0.78 08/02/2015   BUN 8 08/02/2015   CO2 27 08/02/2015   TSH 1.03 07/29/2015   INR 1.3 11/17/2012   HGBA1C 5.9 07/29/2015   MICROALBUR <0.7 07/29/2015

## 2015-10-28 NOTE — Addendum Note (Signed)
Addended by: Lyman Bishop on: 10/28/2015 10:32 AM   Modules accepted: Orders

## 2015-10-28 NOTE — Progress Notes (Signed)
Subjective:    Patient ID: Nicole Melendez, female    DOB: [birth year], 40 y.o.   MRN: [id number]  HPI  Here for wellness and f/u;  Overall doing ok;  Pt denies Chest pain, worsening SOB, DOE, wheezing, orthopnea, PND, worsening LE edema, palpitations, dizziness or syncope.  Pt denies neurological change such as new headache, facial or extremity weakness.  Pt denies polydipsia, polyuria, or low sugar symptoms. Pt states overall good compliance with treatment and medications, good tolerability, and has been trying to follow appropriate diet.  Pt denies worsening depressive symptoms, suicidal ideation or panic. No fever, night sweats, wt loss, loss of appetite, or other constitutional symptoms.  Pt states good ability with ADL's, has low fall risk, home safety reviewed and adequate, no other significant changes in hearing or vision, and only occasionally active with exercise.   Still having left hip pain after seeing ortho dec 27, then at [facility] on Jan 1 with same, ibuprofen not working well, and has diluadid prn but can only use rarely.  Cant work on dilaudid due to sleepiness. Sees pain specialist for lower back, just taking time about 7-10 days for notes to get to pain specialist.  Is still trying to work, has MRI planned for Jan 9.,    Tramadol would not work, cannot get pain meds from rheum as well. Is also wanting to avoid cortisone shot to hip , is OK for toradol/ Pt continues to have recurring LBP without change in severity, bowel or bladder change, fever, wt loss,  worsening LE pain/numbness/weakness, gait change or falls except for recurring pain radiating to left hip.  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 Morbid obesity ([place])   . Peptic ulcer disease   . GERD (gastroesophageal reflux disease)   . Migraine   . Anxiety   . Anemia   . Hyperlipidemia   . Menorrhagia   . PNA (pneumonia) july 2011  . Depression   . Sarcoid ([place])     including  hand per rheumatology-Dr. Amil Amen  . Allergic rhinitis, cause unspecified   . Varicose veins with pain   . Positive ANA (antinuclear antibody) 02/14/2012  . Shortness of breath     on exertion  . History of blood transfusion   . Sarcoidosis of lung ([place])   . Otitis media 06/28/2015  . Essential hypertension 08/08/2015  . Diabetes mellitus type II     steroid related, patient states "im no longer diabetic"   Past Surgical History  Procedure Laterality Date  . Uterine ablation  03/2010  . Wisdom tooth extraction    . Abdominal hysterectomy      reports that she quit smoking about 3 years ago. Her smoking use included Cigarettes. She has a 20 pack-year smoking history. She does not have any smokeless tobacco history on file. She reports that she does not drink alcohol or use illicit drugs. family history includes Allergies in her mother; Bone cancer in her maternal aunt; Breast cancer in her maternal aunt; Diabetes in her father and mother; Heart attack in her mother; Heart disease in her father; Lung cancer in her maternal aunt; Ovarian cancer in her maternal aunt; Rheum arthritis in her father. There is no history of Other. Allergies  Allergen Reactions  . Azithromycin Hives    (z pak) hives  . Penicillins Shortness Of Breath and Swelling    Has patient had a PCN reaction causing immediate rash, facial/tongue/throat swelling, SOB  or lightheadedness with hypotension: Yes Has patient had a PCN reaction causing severe rash involving mucus membranes or skin necrosis: No Has patient had a PCN reaction that required hospitalization No Has patient had a PCN reaction occurring within the last 10 years: No If all of the above answers are "NO", then may proceed with Cephalosporin use.  REACTION: swelling and difficulty breathing  . Shellfish Allergy Anaphylaxis  . Klonopin [Clonazepam] Other (See Comments)    Memory difficulty  . Bee Venom    Current Outpatient Prescriptions on File Prior to  Visit  Medication Sig Dispense Refill  . acetaminophen (TYLENOL) 500 MG tablet Take 1,000 mg by mouth every 6 (six) hours as needed for mild pain or moderate pain.    Marland Kitchen ALPRAZolam (XANAX) 0.5 MG tablet TAKE 1 TABLET BY MOUTH THREE TIMES DAILY AS NEEDED (Patient taking differently: TAKE 1 TABLET BY MOUTH THREE TIMES DAILY AS NEEDED FOR ANXIETY.) 90 tablet 2  . B-D UF III MINI PEN NEEDLES 31G X 5 MM MISC USE FOUR TIMES DAILY FOR SLIDING SCALE AS NEEDED 200 each 0  . Blood Glucose Monitoring Suppl (ONE TOUCH ULTRA 2) W/DEVICE KIT Use as directed four times daily. Dx: E11.9 1 each 0  . budesonide-formoterol (SYMBICORT) 160-4.5 MCG/ACT inhaler Inhale 2 puffs into the lungs 2 (two) times daily. (Patient taking differently: Inhale 2 puffs into the lungs 2 (two) times daily as needed (SHORTNESS OF BREATH). ) 1 Inhaler 11  . cetirizine (ZYRTEC) 10 MG tablet TAKE 1 TABLET BY MOUTH EVERY DAY 30 tablet 5  . cholecalciferol (VITAMIN D) 1000 UNITS tablet Take 5,000 Units by mouth daily.    . cyclobenzaprine (FLEXERIL) 5 MG tablet Take 1 tablet (5 mg total) by mouth at bedtime as needed for muscle spasms. 30 tablet 3  . folic acid (FOLVITE) 1 MG tablet Take 1 tablet (1 mg total) by mouth daily. 60 tablet 5  . glucose blood (ONE TOUCH ULTRA TEST) test strip Use to check blood sugars four times a day Dx E11.9 400 each 11  . HYDROmorphone (DILAUDID) 4 MG tablet Take 4 mg by mouth every 4 (four) hours as needed for severe pain.    Marland Kitchen levalbuterol (XOPENEX HFA) 45 MCG/ACT inhaler Inhale 1-2 puffs into the lungs 2 (two) times daily. (Patient taking differently: Inhale 1-2 puffs into the lungs every 8 (eight) hours as needed for wheezing or shortness of breath. ) 15 g 11  . methotrexate (RHEUMATREX) 2.5 MG tablet Take 10 mg by mouth every Wednesday.     . predniSONE (DELTASONE) 5 MG tablet Take 1 tablet (5 mg total) by mouth daily with breakfast. When current prednisone pulse of 76m dosing ends 60 tablet 6  . sertraline  (ZOLOFT) 100 MG tablet TAKE 2 TABLETS BY MOUTH DAILY 180 tablet 1  . SUMAtriptan (IMITREX) 100 MG tablet Take 1 tablet (100 mg total) by mouth every 2 (two) hours as needed for migraine. May repeat in 2 hours if headache persists or recurs. 10 tablet 11  . telmisartan (MICARDIS) 40 MG tablet Take 1 tablet (40 mg total) by mouth daily. 90 tablet 3  . vitamin B-12 (CYANOCOBALAMIN) 1000 MCG tablet Take 1,000 mcg by mouth daily.    . nabumetone (RELAFEN) 500 MG tablet Take 500 mg by mouth 2 (two) times daily. Reported on 10/28/2015    . ondansetron (ZOFRAN) 4 MG tablet Take 1 tablet (4 mg total) by mouth every 8 (eight) hours as needed for nausea or vomiting. (Patient not  taking: Reported on 10/15/2015) 15 tablet 0  . oxyCODONE-acetaminophen (PERCOCET/ROXICET) 5-325 MG tablet Take 1 tablet by mouth daily as needed for moderate pain. For pain (Patient not taking: Reported on 10/15/2015) 30 tablet 0  . traMADol (ULTRAM) 50 MG tablet Take 1 tablet (50 mg total) by mouth every 6 (six) hours as needed. (Patient not taking: Reported on 10/28/2015) 15 tablet 0   No current facility-administered medications on file prior to visit.    Review of Systems Constitutional: Negative for increased diaphoresis, other activity, appetite or siginficant weight change other than noted HENT: Negative for worsening hearing loss, ear pain, facial swelling, mouth sores and neck stiffness.   Eyes: Negative for other worsening pain, redness or visual disturbance.  Respiratory: Negative for shortness of breath and wheezing  Cardiovascular: Negative for chest pain and palpitations.  Gastrointestinal: Negative for diarrhea, blood in stool, abdominal distention or other pain Genitourinary: Negative for hematuria, flank pain or change in urine volume.  Musculoskeletal: Negative for myalgias or other joint complaints.  Skin: Negative for color change and wound or drainage.  Neurological: Negative for syncope and numbness. other than  noted Hematological: Negative for adenopathy. or other swelling Psychiatric/Behavioral: Negative for hallucinations, SI, self-injury, decreased concentration or other worsening agitation.      Objective:   Physical Exam BP 126/82 mmHg  Pulse 88  Temp(Src) 98 F (36.7 C) (Oral)  Ht _0  (1.702 m)  Wt 326 lb (147.873 kg)  BMI 51.05 kg/m2  SpO2 97%  LMP 07/17/2012 VS noted,  Constitutional: Pt is oriented to person, place, and time. Appears well-developed and well-nourished, in no significant distress Head: Normocephalic and atraumatic.  Right Ear: External ear normal.  Left Ear: External ear normal.  Nose: Nose normal.  Mouth/Throat: Oropharynx is clear and moist.  Eyes: Conjunctivae and EOM are normal. Pupils are equal, round, and reactive to light.  Neck: Normal range of motion. Neck supple. No JVD present. No tracheal deviation present or significant neck LA or mass Cardiovascular: Normal rate, regular rhythm, normal heart sounds and intact distal pulses.   Pulmonary/Chest: Effort normal and breath sounds without rales or wheezing  Abdominal: Soft. Bowel sounds are normal. NT. No HSM  Musculoskeletal: Normal range of motion. Exhibits no edema.  Lymphadenopathy:  Has no cervical adenopathy.  Neurological: Pt is alert and oriented to person, place, and time. Pt has normal reflexes. No cranial nerve deficit. Motor grossly intact Skin: Skin is warm and dry. No rash noted.  Psychiatric:  Has mild dysphoric nervous mood and affect. Behavior is normal.     Assessment & Plan:

## 2015-10-28 NOTE — Patient Instructions (Addendum)
You had the toradol shot today for pain, as well as the Prevnar pneuomonia shot.  Please take all new medication as prescribed  - the gabapentin  Please start the gabapentin at 300 mg at night only for 3 nights, then twice per day for 3 days, then three times per day after that  Please continue all other medications as before, and refills have been done if requested.  Please have the pharmacy call with any other refills you may need.  Please continue your efforts at being more active, low cholesterol diet, and weight control.  You are otherwise up to date with prevention measures today.  Please keep your appointments with your specialists as you may have planned  Please return in 1 year for your yearly visit, or sooner if needed

## 2015-10-28 NOTE — Assessment & Plan Note (Signed)
Overall doing well, age appropriate education and counseling updated, referrals for preventative services and immunizations addressed, dietary and smoking counseling addressed, most recent labs reviewed.  I have personally reviewed and have noted:  1) the patient's medical and social history 2) The pt's use of alcohol, tobacco, and illicit drugs 3) The patient's current medications and supplements 4) Functional ability including ADL's, fall risk, home safety risk, hearing and visual impairment 5) Diet and physical activities 6) Evidence for depression or mood disorder 7) The patient's height, weight, and BMI have been recorded in the chart  I have made referrals, and provided counseling and education based on review of the above Lab Results  Component Value Date   WBC 8.5 08/02/2015   HGB 12.6 08/02/2015   HCT 37.3 08/02/2015   PLT 267 08/02/2015   GLUCOSE 105* 08/02/2015   CHOL 210* 10/17/2014   TRIG 101.0 10/17/2014   HDL 58.30 10/17/2014   LDLDIRECT 135.0 07/05/2011   LDLCALC 132* 10/17/2014   ALT 13 07/29/2015   AST 15 07/29/2015   NA 136 08/02/2015   K 4.2 08/02/2015   CL 103 08/02/2015   CREATININE 0.78 08/02/2015   BUN 8 08/02/2015   CO2 27 08/02/2015   TSH 1.03 07/29/2015   INR 1.3 11/17/2012   HGBA1C 5.9 07/29/2015   MICROALBUR <0.7 07/29/2015   No further labs felt needed today

## 2015-10-28 NOTE — Assessment & Plan Note (Signed)
With recent MRI with lumbar spinal stenosis and potential foraminal stenosis l4-5 left side, ok for trial gabapentin, f/u pain toradol shot today

## 2015-10-28 NOTE — Assessment & Plan Note (Signed)
stable overall by history and exam, recent data reviewed with pt, and pt to continue medical treatment as before,  to f/u any worsening symptoms or concerns BP Readings from Last 3 Encounters:  10/28/15 126/82  10/26/15 155/88  10/15/15 159/81

## 2015-10-28 NOTE — Progress Notes (Signed)
Pre visit review using our clinic review tool, if applicable. No additional management support is needed unless otherwise documented below in the visit note. 

## 2015-10-28 NOTE — Assessment & Plan Note (Signed)
stable overall by history and exam, recent data reviewed with pt, and pt to continue medical treatment as before,  to f/u any worsening symptoms or concerns Lab Results  Component Value Date   LDLCALC 132* 10/17/2014    

## 2015-11-21 ENCOUNTER — Emergency Department (HOSPITAL_COMMUNITY)
Admission: EM | Admit: 2015-11-21 | Discharge: 2015-11-22 | Disposition: A | Discharge status: Home / Self Care | Payer: BLUE CROSS/BLUE SHIELD | Attending: Emergency Medicine | Admitting: Emergency Medicine

## 2015-11-21 ENCOUNTER — Emergency Department (HOSPITAL_COMMUNITY): Payer: BLUE CROSS/BLUE SHIELD

## 2015-11-21 ENCOUNTER — Encounter (HOSPITAL_COMMUNITY): Payer: Self-pay | Admitting: Emergency Medicine

## 2015-11-21 DIAGNOSIS — G43909 Migraine, unspecified, not intractable, without status migrainosus: Secondary | ICD-10-CM | POA: Insufficient documentation

## 2015-11-21 DIAGNOSIS — Z8701 Personal history of pneumonia (recurrent): Secondary | ICD-10-CM | POA: Insufficient documentation

## 2015-11-21 DIAGNOSIS — Z862 Personal history of diseases of the blood and blood-forming organs and certain disorders involving the immune mechanism: Secondary | ICD-10-CM | POA: Insufficient documentation

## 2015-11-21 DIAGNOSIS — S199XXA Unspecified injury of neck, initial encounter: Secondary | ICD-10-CM | POA: Diagnosis not present

## 2015-11-21 DIAGNOSIS — Y92410 Unspecified street and highway as the place of occurrence of the external cause: Secondary | ICD-10-CM | POA: Diagnosis not present

## 2015-11-21 DIAGNOSIS — Z7952 Long term (current) use of systemic steroids: Secondary | ICD-10-CM | POA: Diagnosis not present

## 2015-11-21 DIAGNOSIS — Z791 Long term (current) use of non-steroidal anti-inflammatories (NSAID): Secondary | ICD-10-CM | POA: Diagnosis not present

## 2015-11-21 DIAGNOSIS — Z8719 Personal history of other diseases of the digestive system: Secondary | ICD-10-CM | POA: Diagnosis not present

## 2015-11-21 DIAGNOSIS — Z880 Allergy status to penicillin: Secondary | ICD-10-CM | POA: Insufficient documentation

## 2015-11-21 DIAGNOSIS — I10 Essential (primary) hypertension: Secondary | ICD-10-CM | POA: Insufficient documentation

## 2015-11-21 DIAGNOSIS — Y998 Other external cause status: Secondary | ICD-10-CM | POA: Insufficient documentation

## 2015-11-21 DIAGNOSIS — S301XXA Contusion of abdominal wall, initial encounter: Secondary | ICD-10-CM | POA: Diagnosis not present

## 2015-11-21 DIAGNOSIS — S29001A Unspecified injury of muscle and tendon of front wall of thorax, initial encounter: Secondary | ICD-10-CM | POA: Diagnosis present

## 2015-11-21 DIAGNOSIS — E119 Type 2 diabetes mellitus without complications: Secondary | ICD-10-CM | POA: Insufficient documentation

## 2015-11-21 DIAGNOSIS — Z8669 Personal history of other diseases of the nervous system and sense organs: Secondary | ICD-10-CM | POA: Diagnosis not present

## 2015-11-21 DIAGNOSIS — F419 Anxiety disorder, unspecified: Secondary | ICD-10-CM | POA: Insufficient documentation

## 2015-11-21 DIAGNOSIS — Z79899 Other long term (current) drug therapy: Secondary | ICD-10-CM | POA: Diagnosis not present

## 2015-11-21 DIAGNOSIS — S20219A Contusion of unspecified front wall of thorax, initial encounter: Secondary | ICD-10-CM

## 2015-11-21 DIAGNOSIS — Z87891 Personal history of nicotine dependence: Secondary | ICD-10-CM | POA: Insufficient documentation

## 2015-11-21 DIAGNOSIS — F329 Major depressive disorder, single episode, unspecified: Secondary | ICD-10-CM | POA: Insufficient documentation

## 2015-11-21 DIAGNOSIS — Y9389 Activity, other specified: Secondary | ICD-10-CM | POA: Diagnosis not present

## 2015-11-21 DIAGNOSIS — Z8711 Personal history of peptic ulcer disease: Secondary | ICD-10-CM | POA: Diagnosis not present

## 2015-11-21 DIAGNOSIS — Z8742 Personal history of other diseases of the female genital tract: Secondary | ICD-10-CM | POA: Diagnosis not present

## 2015-11-21 LAB — CBC
HCT: 37.5 % (ref 36.0–46.0)
HEMOGLOBIN: 12.5 g/dL (ref 12.0–15.0)
MCH: 28.9 pg (ref 26.0–34.0)
MCHC: 33.3 g/dL (ref 30.0–36.0)
MCV: 86.8 fL (ref 78.0–100.0)
Platelets: 315 10*3/uL (ref 150–400)
RBC: 4.32 MIL/uL (ref 3.87–5.11)
RDW: 15.4 % (ref 11.5–15.5)
WBC: 14.8 10*3/uL — AB (ref 4.0–10.5)

## 2015-11-21 LAB — BASIC METABOLIC PANEL
ANION GAP: 13 (ref 5–15)
BUN: 10 mg/dL (ref 6–20)
CALCIUM: 9.5 mg/dL (ref 8.9–10.3)
CO2: 24 mmol/L (ref 22–32)
CREATININE: 0.67 mg/dL (ref 0.44–1.00)
Chloride: 103 mmol/L (ref 101–111)
GFR calc non Af Amer: >60 mL/min (ref >60)
Glucose, Bld: 159 mg/dL — ABNORMAL HIGH (ref 65–99)
Potassium: 3.9 mmol/L (ref 3.5–5.1)
SODIUM: 140 mmol/L (ref 135–145)

## 2015-11-21 MED ORDER — FENTANYL CITRATE (PF) 100 MCG/2ML IJ SOLN
50.0000 ug | Freq: Once | INTRAMUSCULAR | Status: AC
  Administered 2015-11-21: 50 ug via INTRAVENOUS
  Filled 2015-11-21: qty 2

## 2015-11-21 MED ORDER — IBUPROFEN 600 MG PO TABS: 600.0000 mg | ORAL_TABLET | Freq: Four times a day (QID) | ORAL | Status: DC | PRN

## 2015-11-21 MED ORDER — IOHEXOL 300 MG/ML  SOLN
100.0000 mL | Freq: Once | INTRAMUSCULAR | Status: AC | PRN
  Administered 2015-11-21: 100 mL via INTRAVENOUS

## 2015-11-21 MED ORDER — OXYCODONE-ACETAMINOPHEN 5-325 MG PO TABS: 2.0000 | ORAL_TABLET | ORAL | Status: DC | PRN
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
## 2015-11-21 NOTE — ED Notes (Signed)
Pt ambulatory to restroom with standby assist
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
## 2015-11-22 DIAGNOSIS — S20219A Contusion of unspecified front wall of thorax, initial encounter: Secondary | ICD-10-CM | POA: Diagnosis not present

## 2015-11-26 ENCOUNTER — Ambulatory Visit (INDEPENDENT_AMBULATORY_CARE_PROVIDER_SITE_OTHER)
Admission: RE | Admit: 2015-11-26 | Discharge: 2015-11-26 | Disposition: A | Discharge status: Home / Self Care | Payer: BLUE CROSS/BLUE SHIELD | Source: Ambulatory Visit | Attending: Internal Medicine | Admitting: Internal Medicine

## 2015-11-26 ENCOUNTER — Ambulatory Visit: Payer: Self-pay | Admitting: Emergency Medicine

## 2015-11-26 ENCOUNTER — Encounter: Payer: Self-pay | Admitting: Internal Medicine

## 2015-11-26 ENCOUNTER — Ambulatory Visit (INDEPENDENT_AMBULATORY_CARE_PROVIDER_SITE_OTHER): Payer: BLUE CROSS/BLUE SHIELD | Admitting: Emergency Medicine

## 2015-11-26 ENCOUNTER — Ambulatory Visit (INDEPENDENT_AMBULATORY_CARE_PROVIDER_SITE_OTHER): Payer: BLUE CROSS/BLUE SHIELD | Admitting: Internal Medicine

## 2015-11-26 ENCOUNTER — Encounter: Payer: Self-pay | Admitting: Emergency Medicine

## 2015-11-26 VITALS — BP 130/80 | HR 98 | Temp 98.6°F | Resp 20 | Ht 67.0 in | Wt 327.0 lb

## 2015-11-26 VITALS — BP 114/82 | HR 101 | Ht 67.0 in | Wt 325.0 lb

## 2015-11-26 DIAGNOSIS — Z23 Encounter for immunization: Secondary | ICD-10-CM

## 2015-11-26 DIAGNOSIS — J309 Allergic rhinitis, unspecified: Secondary | ICD-10-CM

## 2015-11-26 DIAGNOSIS — R51 Headache: Secondary | ICD-10-CM | POA: Diagnosis not present

## 2015-11-26 DIAGNOSIS — R519 Headache, unspecified: Secondary | ICD-10-CM

## 2015-11-26 DIAGNOSIS — T07XXXA Unspecified multiple injuries, initial encounter: Secondary | ICD-10-CM | POA: Insufficient documentation

## 2015-11-26 DIAGNOSIS — M545 Low back pain, unspecified: Secondary | ICD-10-CM

## 2015-11-26 DIAGNOSIS — D869 Sarcoidosis, unspecified: Secondary | ICD-10-CM

## 2015-11-26 DIAGNOSIS — M542 Cervicalgia: Secondary | ICD-10-CM | POA: Diagnosis not present

## 2015-11-26 DIAGNOSIS — T148 Other injury of unspecified body region: Secondary | ICD-10-CM | POA: Diagnosis not present

## 2015-11-26 NOTE — Patient Instructions (Signed)
Please continue your prednisone and methotrexate as directed by Dr Amil Amen. I agree with your goal to decrease your prednisone ultimately to zero.  Her CT scan of the chest shows no evidence of active sarcoidosis. There is some subtle postinflammatory change. Do not need to act on this at this time.  Please remember to get your ophthalmology exam once a year Continue Symbicort twice a day Follow with Dr Lamonte Sakai in 6 months or sooner if you have any problems

## 2015-11-26 NOTE — Progress Notes (Signed)
Pre visit review using our clinic review tool, if applicable. No additional management support is needed unless otherwise documented below in the visit note. 

## 2015-11-26 NOTE — Assessment & Plan Note (Signed)
Please continue your prednisone and methotrexate as directed by Dr Amil Amen. I agree with your goal to decrease your prednisone ultimately to zero.  Her CT scan of the chest shows no evidence of active sarcoidosis. There is some subtle postinflammatory change. Do not need to act on this at this time.  Please remember to get your ophthalmology exam once a year Continue Symbicort twice a day Follow with Dr Lamonte Sakai in 6 months or sooner if you have any problems

## 2015-11-26 NOTE — Progress Notes (Signed)
Subjective:    Patient ID: Nicole Melendez, female    DOB: 1976-09-18, 40 y.o.   MRN: [id number]  HPI 40 year old woman with a history of sarcoidosis and former tobacco use (20 pack years), diabetes, GERD, hypertension. Followed in the past by Dr Gwenette Greet and had been managed on Symbicort (stopped last time), methotrexate, and daily prednisone. Current prednisone dose is 5mg . She has been seen at the end of October and also on 09/02/15 by Dr. Jenny Reichmann for progressive dyspnea that was treated with corticosteroids and Levaquin, completed 3 weeks ago.  She underwent pulmonary function testing today that I personally reviewed. This shows grossly normal airflows without a bronchodilator response, now restriction based on a slightly decreased TLC, and a decreased diffusion capacity that corrects to normal range for alveolar volume.   Having nasal drainage, some green. Cough is a bit better than last month, but now producing colored mucous. She is on zyrtec, has stopped the flonase nasal spray. She is doing nasal saline spray. She gets regular eye exams.   ROV 11/26/15 -- patient follows up for her history of sarcoidosis and former tobacco use. She has been managed on methotrexate and daily prednisone. Her current dose is. She is having some nasal drainage and congestion, still on flonase and zyrtec.  She is on symbicort bid, rarely needs xopenex prn.  On stable dose MTX 10mg  per Dr Amil Amen. She was treated recently with a burst of prednisone for Low back neuropathic pain, now back to 5mg  daily.   Review of Systems  As per HPI     Objective:   Physical Exam  Filed Vitals:   11/26/15 1636  BP: 114/82  Pulse: 101  Height: 5\' 7"  (1.702 m)  Weight: 325 lb (147.419 kg)  SpO2: 98%   Gen: Pleasant, overweight, in no distress,  normal affect  ENT: No lesions,  mouth clear,  oropharynx clear, no postnasal drip  Neck: No JVD, no stridor  Lungs: No use of accessory muscles, clear without rales or rhonchi, no  wheeze on forced expiration  Cardiovascular: RRR, heart sounds normal, no murmur or gallops, no peripheral edema  Musculoskeletal: No deformities, no cyanosis or clubbing  Neuro: alert, non focal  Skin: Warm, no lesions or rashes    10/06/15 --  COMPARISON: 05/13/2010.  FINDINGS: Mediastinum/Lymph Nodes: Mediastinal lymph nodes are not enlarged by CT size criteria. Hilar regions are difficult to definitively evaluate without IV contrast. No axillary adenopathy. Heart size normal. No pericardial effusion.  Lungs/Pleura: Image quality is somewhat degraded by body habitus. Mild patchy upper and midlung zone predominant ground-glass with slight architectural distortion. A corresponding acute infectious/ inflammatory process was seen in the upper/ mid lung zones on 05/13/2010. No pleural fluid. Airway is unremarkable.  Upper abdomen: Visualized portions of the liver, left adrenal gland, left kidney, spleen and pancreas are grossly unremarkable. Postoperative changes in the stomach.  Musculoskeletal: No worrisome lytic or sclerotic lesions.  IMPRESSION: 1. Upper/midlung zone predominant ground-glass and mild architectural distortion may represent the residua of acute changes of sarcoid seen on 05/13/2010. Post infectious scarring can also have this appearance. 2. Mediastinal lymph nodes are within normal limits.      Assessment & Plan:  PULMONARY SARCOIDOSIS Please continue your prednisone and methotrexate as directed by Dr Amil Amen. I agree with your goal to decrease your prednisone ultimately to zero.  Her CT scan of the chest shows no evidence of active sarcoidosis. There is some subtle postinflammatory change. Do not need to act  on this at this time.  Please remember to get your ophthalmology exam once a year Continue Symbicort twice a day Follow with Dr Lamonte Sakai in 6 months or sooner if you have any problems  Allergic rhinitis Stable on fluticasone, Zyrtec, nasal  saline washes
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
## 2015-11-26 NOTE — Patient Instructions (Addendum)
You had the Pneumovax pneumonia shot today  Please continue all other medications as before, and refills have been done if requested.  Please have the pharmacy call with any other refills you may need.  Please continue your efforts at being more active, low cholesterol diet, and weight control  Please keep your appointments with your specialists as you may have planned - Dr Lamonte Sakai later today  You are given the work note today  Please submit the FMLA forms and handicap forms as you mentioned  Please go to the XRAY Department in the Basement (go straight as you get off the elevator) for the x-ray testing  You will be contacted by phone if any changes need to be made immediately.  Otherwise, you will receive a letter about your results with an explanation, but please check with MyChart first.  Please remember to sign up for MyChart if you have not done so, as this will be important to you in the future with finding out test results, communicating by private email, and scheduling acute appointments online when needed.

## 2015-11-26 NOTE — Assessment & Plan Note (Signed)
Stable on fluticasone, Zyrtec, nasal saline washes

## 2015-11-27 ENCOUNTER — Encounter: Payer: Self-pay | Admitting: Internal Medicine
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
## 2015-11-27 NOTE — Assessment & Plan Note (Signed)
D/w pt, benign, should heal in several wks, is given work note as well
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
## 2015-12-02 ENCOUNTER — Telehealth: Payer: Self-pay

## 2015-12-02 NOTE — Telephone Encounter (Signed)
Handicap Placard and FMLA paperwork received and placed on MD's desk for completion and signature

## 2015-12-03 ENCOUNTER — Encounter: Payer: Self-pay | Admitting: Family

## 2015-12-03 ENCOUNTER — Ambulatory Visit (INDEPENDENT_AMBULATORY_CARE_PROVIDER_SITE_OTHER): Payer: BLUE CROSS/BLUE SHIELD | Admitting: Family

## 2015-12-03 ENCOUNTER — Encounter: Payer: Self-pay | Admitting: Internal Medicine

## 2015-12-03 VITALS — BP 158/104 | HR 85 | Temp 97.9°F | Resp 18 | Ht 67.0 in | Wt 325.0 lb

## 2015-12-03 DIAGNOSIS — S161XXA Strain of muscle, fascia and tendon at neck level, initial encounter: Secondary | ICD-10-CM | POA: Insufficient documentation

## 2015-12-03 DIAGNOSIS — S161XXD Strain of muscle, fascia and tendon at neck level, subsequent encounter: Secondary | ICD-10-CM

## 2015-12-03 DIAGNOSIS — T148 Other injury of unspecified body region: Secondary | ICD-10-CM | POA: Diagnosis not present

## 2015-12-03 DIAGNOSIS — F0781 Postconcussional syndrome: Secondary | ICD-10-CM | POA: Diagnosis not present

## 2015-12-03 DIAGNOSIS — T07XXXA Unspecified multiple injuries, initial encounter: Secondary | ICD-10-CM

## 2015-12-03 MED ORDER — TIZANIDINE HCL 4 MG PO TABS: 4.0000 mg | ORAL_TABLET | Freq: Four times a day (QID) | ORAL | Status: DC | PRN

## 2015-12-03 MED ORDER — DICLOFENAC SODIUM 2 % TD SOLN: 1.0000 "application " | Freq: Two times a day (BID) | TRANSDERMAL | Status: DC | PRN

## 2015-12-03 MED ORDER — NAPROXEN-ESOMEPRAZOLE 500-20 MG PO TBEC: 1.0000 | DELAYED_RELEASE_TABLET | Freq: Two times a day (BID) | ORAL | Status: DC | PRN

## 2015-12-03 NOTE — Assessment & Plan Note (Signed)
Neurological exam is unremarkable. Symptoms and exam consistent with postconcussive syndrome. Discussed with patient that this may take several days to weeks to resolve. Continue over-the-counter medications as needed for symptom relief. Follow-up if symptoms worsen or fail to improve or if worst headache of life develops.
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
## 2015-12-03 NOTE — Assessment & Plan Note (Signed)
Multiple contusions appear healing well with no complications. Continue conservative treatment of ice/heat follow-up if symptoms worsen or fail to improve.

## 2015-12-03 NOTE — Progress Notes (Signed)
Pre visit review using our clinic review tool, if applicable. No additional management support is needed unless otherwise documented below in the visit note. 
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
## 2015-12-03 NOTE — Patient Instructions (Addendum)
Thank you for choosing [facility].  Summary/Instructions:  Please start taking the Vimovo for inflammation [place] to neck 2x per day as needed about pinkie sized dose. Ice multiple times per day with intermittent heat. Exercises daily. Follow up in 1 week to determine improvements.   Your prescription(s) have been submitted to your pharmacy or been printed and provided for you. Please take as directed and contact our office if you believe you are having problem(s) with the medication(s) or have any questions.  If your symptoms worsen or fail to improve, please contact our office for further instruction, or in case of emergency go directly to the emergency room at the closest medical facility.   Cervical Strain and Sprain With Rehab Cervical strain and sprain are injuries that commonly occur with "whiplash" injuries. Whiplash occurs when the neck is forcefully whipped backward or forward, such as during a motor vehicle accident or during contact sports. The muscles, ligaments, tendons, discs, and nerves of the neck are susceptible to injury when this occurs. RISK FACTORS Risk of having a whiplash injury increases if:  Osteoarthritis of the spine.  Situations that make head or neck accidents or trauma more likely.  High-risk sports (football, rugby, wrestling, hockey, auto racing, gymnastics, diving, contact karate, or boxing).  Poor strength and flexibility of the neck.  Previous neck injury.  Poor tackling technique.  Improperly fitted or padded equipment. SYMPTOMS   Pain or stiffness in the front or back of neck or both.  Symptoms may present immediately or up to 24 hours after injury.  Dizziness, headache, nausea, and vomiting.  Muscle spasm with soreness and stiffness in the neck.  Tenderness and swelling at the injury site. PREVENTION  Learn and use proper technique (avoid tackling with the head, spearing, and head-butting; use proper falling techniques to  avoid landing on the head).  Warm up and stretch properly before activity.  Maintain physical fitness:  Strength, flexibility, and endurance.  Cardiovascular fitness.  Wear properly fitted and padded protective equipment, such as padded soft collars, for participation in contact sports. PROGNOSIS  Recovery from cervical strain and sprain injuries is dependent on the extent of the injury. These injuries are usually curable in 1 week to 3 months with appropriate treatment.  RELATED COMPLICATIONS   Temporary numbness and weakness may occur if the nerve roots are damaged, and this may persist until the nerve has completely healed.  Chronic pain due to frequent recurrence of symptoms.  Prolonged healing, especially if activity is resumed too soon (before complete recovery). TREATMENT  Treatment initially involves the use of ice and medication to help reduce pain and inflammation. It is also important to perform strengthening and stretching exercises and modify activities that worsen symptoms so the injury does not get worse. These exercises may be performed at home or with a therapist. For patients who experience severe symptoms, a soft, padded collar may be recommended to be worn around the neck.  Improving your posture may help reduce symptoms. Posture improvement includes pulling your chin and abdomen in while sitting or standing. If you are sitting, sit in a firm chair with your buttocks against the back of the chair. While sleeping, try replacing your pillow with a small towel rolled to 2 inches in diameter, or use a cervical pillow or soft cervical collar. Poor sleeping positions delay healing.  For patients with nerve root damage, which causes numbness or weakness, the use of a cervical traction apparatus may be recommended. Surgery is rarely necessary  for these injuries. However, cervical strain and sprains that are present at birth (congenital) may require surgery. MEDICATION   If pain  medication is necessary, nonsteroidal anti-inflammatory medications, such as aspirin and ibuprofen, or other minor pain relievers, such as acetaminophen, are often recommended.  Do not take pain medication for 7 days before surgery.  Prescription pain relievers may be given if deemed necessary by your caregiver. Use only as directed and only as much as you need. HEAT AND COLD:   Cold treatment (icing) relieves pain and reduces inflammation. Cold treatment should be applied for 10 to 15 minutes every 2 to 3 hours for inflammation and pain and immediately after any activity that aggravates your symptoms. Use ice packs or an ice massage.  Heat treatment may be used prior to performing the stretching and strengthening activities prescribed by your caregiver, physical therapist, or athletic trainer. Use a heat pack or a warm soak. SEEK MEDICAL CARE IF:   Symptoms get worse or do not improve in 2 weeks despite treatment.  New, unexplained symptoms develop (drugs used in treatment may produce side effects). EXERCISES RANGE OF MOTION (ROM) AND STRETCHING EXERCISES - Cervical Strain and Sprain These exercises may help you when beginning to rehabilitate your injury. In order to successfully resolve your symptoms, you must improve your posture. These exercises are designed to help reduce the forward-head and rounded-shoulder posture which contributes to this condition. Your symptoms may resolve with or without further involvement from your physician, physical therapist or athletic trainer. While completing these exercises, remember:   Restoring tissue flexibility helps normal motion to return to the joints. This allows healthier, less painful movement and activity.  An effective stretch should be held for at least 20 seconds, although you may need to begin with shorter hold times for comfort.  A stretch should never be painful. You should only feel a gentle lengthening or release in the stretched  tissue. STRETCH- Axial Extensors  Lie on your back on the floor. You may bend your knees for comfort. Place a rolled-up hand towel or dish towel, about 2 inches in diameter, under the part of your head that makes contact with the floor.  Gently tuck your chin, as if trying to make a "double chin," until you feel a gentle stretch at the base of your head.  Hold __________ seconds. Repeat __________ times. Complete this exercise __________ times per day.  STRETCH - Axial Extension   Stand or sit on a firm surface. Assume a good posture: chest up, shoulders drawn back, abdominal muscles slightly tense, knees unlocked (if standing) and feet hip width apart.  Slowly retract your chin so your head slides back and your chin slightly lowers. Continue to look straight ahead.  You should feel a gentle stretch in the back of your head. Be certain not to feel an aggressive stretch since this can cause headaches later.  Hold for __________ seconds. Repeat __________ times. Complete this exercise __________ times per day. STRETCH - Cervical Side Bend   Stand or sit on a firm surface. Assume a good posture: chest up, shoulders drawn back, abdominal muscles slightly tense, knees unlocked (if standing) and feet hip width apart.  Without letting your nose or shoulders move, slowly tip your right / left ear to your shoulder until your feel a gentle stretch in the muscles on the opposite side of your neck.  Hold __________ seconds. Repeat __________ times. Complete this exercise __________ times per day. STRETCH - Cervical Rotators  Stand or sit on a firm surface. Assume a good posture: chest up, shoulders drawn back, abdominal muscles slightly tense, knees unlocked (if standing) and feet hip width apart.  Keeping your eyes level with the ground, slowly turn your head until you feel a gentle stretch along the back and opposite side of your neck.  Hold __________ seconds. Repeat __________ times.  Complete this exercise __________ times per day. RANGE OF MOTION - Neck Circles   Stand or sit on a firm surface. Assume a good posture: chest up, shoulders drawn back, abdominal muscles slightly tense, knees unlocked (if standing) and feet hip width apart.  Gently roll your head down and around from the back of one shoulder to the back of the other. The motion should never be forced or painful.  Repeat the motion 10-20 times, or until you feel the neck muscles relax and loosen. Repeat __________ times. Complete the exercise __________ times per day. STRENGTHENING EXERCISES - Cervical Strain and Sprain These exercises may help you when beginning to rehabilitate your injury. They may resolve your symptoms with or without further involvement from your physician, physical therapist, or athletic trainer. While completing these exercises, remember:   Muscles can gain both the endurance and the strength needed for everyday activities through controlled exercises.  Complete these exercises as instructed by your physician, physical therapist, or athletic trainer. Progress the resistance and repetitions only as guided.  You may experience muscle soreness or fatigue, but the pain or discomfort you are trying to eliminate should never worsen during these exercises. If this pain does worsen, stop and make certain you are following the directions exactly. If the pain is still present after adjustments, discontinue the exercise until you can discuss the trouble with your clinician. STRENGTH - Cervical Flexors, Isometric  Face a wall, standing about 6 inches away. Place a small pillow, a ball about 6-8 inches in diameter, or a folded towel between your forehead and the wall.  Slightly tuck your chin and gently push your forehead into the soft object. Push only with mild to moderate intensity, building up tension gradually. Keep your jaw and forehead relaxed.  Hold 10 to 20 seconds. Keep your breathing  relaxed.  Release the tension slowly. Relax your neck muscles completely before you start the next repetition. Repeat __________ times. Complete this exercise __________ times per day. STRENGTH- Cervical Lateral Flexors, Isometric   Stand about 6 inches away from a wall. Place a small pillow, a ball about 6-8 inches in diameter, or a folded towel between the side of your head and the wall.  Slightly tuck your chin and gently tilt your head into the soft object. Push only with mild to moderate intensity, building up tension gradually. Keep your jaw and forehead relaxed.  Hold 10 to 20 seconds. Keep your breathing relaxed.  Release the tension slowly. Relax your neck muscles completely before you start the next repetition. Repeat __________ times. Complete this exercise __________ times per day. STRENGTH - Cervical Extensors, Isometric   Stand about 6 inches away from a wall. Place a small pillow, a ball about 6-8 inches in diameter, or a folded towel between the back of your head and the wall.  Slightly tuck your chin and gently tilt your head back into the soft object. Push only with mild to moderate intensity, building up tension gradually. Keep your jaw and forehead relaxed.  Hold 10 to 20 seconds. Keep your breathing relaxed.  Release the tension slowly. Relax your neck  muscles completely before you start the next repetition. Repeat __________ times. Complete this exercise __________ times per day. POSTURE AND BODY MECHANICS CONSIDERATIONS - Cervical Strain and Sprain Keeping correct posture when sitting, standing or completing your activities will reduce the stress put on different body tissues, allowing injured tissues a chance to heal and limiting painful experiences. The following are general guidelines for improved posture. Your physician or physical therapist will provide you with any instructions specific to your needs. While reading these guidelines, remember:  The exercises  prescribed by your provider will help you have the flexibility and strength to maintain correct postures.  The correct posture provides the optimal environment for your joints to work. All of your joints have less wear and tear when properly supported by a spine with good posture. This means you will experience a healthier, less painful body.  Correct posture must be practiced with all of your activities, especially prolonged sitting and standing. Correct posture is as important when doing repetitive low-stress activities (typing) as it is when doing a single heavy-load activity (lifting). PROLONGED STANDING WHILE SLIGHTLY LEANING FORWARD When completing a task that requires you to lean forward while standing in one place for a long time, place either foot up on a stationary 2- to 4-inch high object to help maintain the best posture. When both feet are on the ground, the low back tends to lose its slight inward curve. If this curve flattens (or becomes too large), then the back and your other joints will experience too much stress, fatigue more quickly, and can cause pain.  RESTING POSITIONS Consider which positions are most painful for you when choosing a resting position. If you have pain with flexion-based activities (sitting, bending, stooping, squatting), choose a position that allows you to rest in a less flexed posture. You would want to avoid curling into a fetal position on your side. If your pain worsens with extension-based activities (prolonged standing, working overhead), avoid resting in an extended position such as sleeping on your stomach. Most people will find more comfort when they rest with their spine in a more neutral position, neither too rounded nor too arched. Lying on a non-sagging bed on your side with a pillow between your knees, or on your back with a pillow under your knees will often provide some relief. Keep in mind, being in any one position for a prolonged period of time, no  matter how correct your posture, can still lead to stiffness. WALKING Walk with an upright posture. Your ears, shoulders, and hips should all line up. OFFICE WORK When working at a desk, create an environment that supports good, upright posture. Without extra support, muscles fatigue and lead to excessive strain on joints and other tissues. CHAIR:  A chair should be able to slide under your desk when your back makes contact with the back of the chair. This allows you to work closely.  The chair's height should allow your eyes to be level with the upper part of your monitor and your hands to be slightly lower than your elbows.  Body position:  Your feet should make contact with the floor. If this is not possible, use a foot rest.  Keep your ears over your shoulders. This will reduce stress on your neck and low back.   This information is not intended to replace advice given to you by your health care provider. Make sure you discuss any questions you have with your health care provider.   Document Released:  10/11/2005 Document Revised: 11/01/2014 Document Reviewed: 01/23/2009 Elsevier Interactive Patient Education 2016 [employer]  Post-Concussion Syndrome Post-concussion syndrome describes the symptoms that can occur after a head injury. These symptoms can last from weeks to months. CAUSES  It is not clear why some head injuries cause post-concussion syndrome. It can occur whether your head injury was mild or severe and whether you were wearing head protection or not.  SIGNS AND SYMPTOMS  Memory difficulties.  Dizziness.  Headaches.  Double vision or blurry vision.  Sensitivity to light.  Hearing difficulties.  Depression.  Tiredness.  Weakness.  Difficulty with concentration.  Difficulty sleeping or staying asleep.  Vomiting.  Poor balance or instability on your feet.  Slow reaction time.  Difficulty learning and remembering things you have heard. DIAGNOSIS   There is no test to determine whether you have post-concussion syndrome. Your health care provider may order an imaging scan of your brain, such as a CT scan, to check for other problems that may be causing your symptoms (such as a severe injury inside your skull). TREATMENT  Usually, these problems disappear over time without medical care. Your health care provider may prescribe medicine to help ease your symptoms. It is important to follow up with a neurologist to evaluate your recovery and address any lingering symptoms or issues. HOME CARE INSTRUCTIONS   Take medicines only as directed by your health care provider. Do not take aspirin. Aspirin can slow blood clotting.  Sleep with your head slightly elevated to help with headaches.  Avoid any situation where there is potential for another head injury. This includes football, hockey, soccer, basketball, martial arts, downhill snow sports, and horseback riding. Your condition will get worse every time you experience a concussion. You should avoid these activities until you are evaluated by the appropriate follow-up health care providers.  Keep all follow-up visits as directed by your health care provider. This is important. SEEK MEDICAL CARE IF:  You have increased problems paying attention or concentrating.  You have increased difficulty remembering or learning new information.  You need more time to complete tasks or assignments than before.  You have increased irritability or decreased ability to cope with stress.  You have more symptoms than before. Seek medical care if you have any of the following symptoms for more than two weeks after your injury:  Lasting (chronic) headaches.  Dizziness or balance problems.  Nausea.  Vision problems.  Increased sensitivity to noise or light.  Depression or mood swings.  Anxiety or irritability.  Memory problems.  Difficulty concentrating or paying attention.  Sleep  problems.  Feeling tired all the time. SEEK IMMEDIATE MEDICAL CARE IF:  You have confusion or unusual drowsiness.  Others find it difficult to wake you up.  You have nausea or persistent, forceful vomiting.  You feel like you are moving when you are not (vertigo). Your eyes may move rapidly back and forth.  You have convulsions or faint.  You have severe, persistent headaches that are not relieved by medicine.  You cannot use your arms or legs normally.  One of your pupils is larger than the other.  You have clear or bloody discharge from your nose or ears.  Your problems are getting worse, not better. MAKE SURE YOU:  Understand these instructions.  Will watch your condition.  Will get help right away if you are not doing well or get worse.   This information is not intended to replace advice given to you by your health  care provider. Make sure you discuss any questions you have with your health care provider.   Document Released: 04/02/2002 Document Revised: 11/01/2014 Document Reviewed: 01/16/2014 Elsevier Interactive Patient Education [employer].

## 2015-12-04 ENCOUNTER — Encounter: Payer: Self-pay | Admitting: Family

## 2015-12-04 DIAGNOSIS — Z7689 Persons encountering health services in other specified circumstances: Secondary | ICD-10-CM

## 2015-12-04 NOTE — Telephone Encounter (Signed)
FMLA signed, faxed, copy sent to scan. Handicap placard signed and placed in cabinet for pt pick up. Pt advised of same

## 2015-12-09 ENCOUNTER — Ambulatory Visit (INDEPENDENT_AMBULATORY_CARE_PROVIDER_SITE_OTHER): Payer: BLUE CROSS/BLUE SHIELD | Admitting: Family

## 2015-12-09 ENCOUNTER — Other Ambulatory Visit: Payer: Self-pay | Admitting: Internal Medicine

## 2015-12-09 ENCOUNTER — Other Ambulatory Visit: Payer: Self-pay | Admitting: Family

## 2015-12-09 ENCOUNTER — Encounter: Payer: Self-pay | Admitting: Family

## 2015-12-09 VITALS — BP 130/88 | HR 85 | Temp 98.0°F | Resp 18 | Ht 67.0 in | Wt 325.0 lb

## 2015-12-09 DIAGNOSIS — F0781 Postconcussional syndrome: Secondary | ICD-10-CM | POA: Diagnosis not present

## 2015-12-09 DIAGNOSIS — T148 Other injury of unspecified body region: Secondary | ICD-10-CM

## 2015-12-09 DIAGNOSIS — R040 Epistaxis: Secondary | ICD-10-CM | POA: Diagnosis not present

## 2015-12-09 DIAGNOSIS — S161XXD Strain of muscle, fascia and tendon at neck level, subsequent encounter: Secondary | ICD-10-CM

## 2015-12-09 DIAGNOSIS — T07XXXA Unspecified multiple injuries, initial encounter: Secondary | ICD-10-CM

## 2015-12-09 MED ORDER — NAPROXEN 500 MG PO TABS: 500.0000 mg | ORAL_TABLET | Freq: Two times a day (BID) | ORAL | Status: DC
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
## 2015-12-09 NOTE — Patient Instructions (Signed)
Thank you for choosing  HealthCare.  Summary/Instructions:  Your prescription(s) have been submitted to your pharmacy or been printed and provided for you. Please take as directed and contact our office if you believe you are having problem(s) with the medication(s) or have any questions.  If your symptoms worsen or fail to improve, please contact our office for further instruction, or in case of emergency go directly to the emergency room at the closest medical facility.     

## 2015-12-09 NOTE — Assessment & Plan Note (Signed)
Neurological exam continues to be normal despite continuation of headaches. Continue naproxen as needed for headaches. Patient informed this may take some time for her symptoms to improve. Follow up for worsening symptoms.
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
## 2015-12-09 NOTE — Telephone Encounter (Signed)
Her employer is having a problem with a part of her FMLA. Can you call them at [phone]

## 2015-12-09 NOTE — Assessment & Plan Note (Signed)
Symptoms are improved with conservative therapy and range of motion is significantly increased compared to previous visit. Continue with current dosage of naproxen and home exercise. No further follow up needed at this time. Follow up for worsening of symptoms.

## 2015-12-10 NOTE — Telephone Encounter (Signed)
Medication printed signed and faxed to pharmacy  

## 2015-12-10 NOTE — Telephone Encounter (Signed)
Contacted FMLA dept. Advised that question 1 was left blank.  Completed and faxed back to dept

## 2015-12-10 NOTE — Telephone Encounter (Signed)
Done Done hardcopy to Corinne  

## 2015-12-15 ENCOUNTER — Encounter: Payer: Self-pay | Admitting: Family

## 2015-12-17 NOTE — Telephone Encounter (Signed)
Work note held over 3 months.  Copy of letter can be found in "letters" tab in pt.s chart.  Will shred letter.  Nothing further needed.

## 2015-12-18 ENCOUNTER — Encounter: Payer: Self-pay | Admitting: Family

## 2015-12-30 ENCOUNTER — Encounter: Payer: Self-pay | Admitting: Internal Medicine

## 2015-12-30 ENCOUNTER — Ambulatory Visit: Payer: Self-pay | Admitting: Internal Medicine

## 2015-12-30 ENCOUNTER — Ambulatory Visit (INDEPENDENT_AMBULATORY_CARE_PROVIDER_SITE_OTHER): Payer: BLUE CROSS/BLUE SHIELD | Admitting: Internal Medicine

## 2015-12-30 VITALS — BP 116/82 | HR 99 | Temp 98.8°F | Resp 20 | Wt 328.0 lb

## 2015-12-30 DIAGNOSIS — B370 Candidal stomatitis: Secondary | ICD-10-CM | POA: Diagnosis not present

## 2015-12-30 DIAGNOSIS — E1140 Type 2 diabetes mellitus with diabetic neuropathy, unspecified: Secondary | ICD-10-CM

## 2015-12-30 DIAGNOSIS — Z794 Long term (current) use of insulin: Secondary | ICD-10-CM

## 2015-12-30 DIAGNOSIS — R51 Headache: Secondary | ICD-10-CM

## 2015-12-30 DIAGNOSIS — K529 Noninfective gastroenteritis and colitis, unspecified: Secondary | ICD-10-CM

## 2015-12-30 DIAGNOSIS — R519 Headache, unspecified: Secondary | ICD-10-CM

## 2015-12-30 DIAGNOSIS — I10 Essential (primary) hypertension: Secondary | ICD-10-CM | POA: Diagnosis not present

## 2015-12-30 MED ORDER — NYSTATIN 100000 UNIT/ML MT SUSP: 500000.0000 [IU] | Freq: Four times a day (QID) | OROMUCOSAL | Status: DC

## 2015-12-30 MED ORDER — DIPHENOXYLATE-ATROPINE 2.5-0.025 MG PO TABS: 1.0000 | ORAL_TABLET | Freq: Four times a day (QID) | ORAL | Status: DC | PRN

## 2015-12-30 MED ORDER — ONDANSETRON HCL 4 MG PO TABS: 4.0000 mg | ORAL_TABLET | Freq: Four times a day (QID) | ORAL | Status: DC | PRN
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
## 2015-12-30 NOTE — Progress Notes (Signed)
Pre visit review using our clinic review tool, if applicable. No additional management support is needed unless otherwise documented below in the visit note. 

## 2015-12-30 NOTE — Assessment & Plan Note (Signed)
Mild to mod, for antibx course,  to f/u any worsening symptoms or concerns 

## 2015-12-30 NOTE — Assessment & Plan Note (Addendum)
C/w viral illness by exam which is o/w benign except for subjective complaints, currently afeb, will tx with zofran prn, lomotil prn,  to f/u any worsening symptoms or concerns; also gave work note

## 2015-12-30 NOTE — Assessment & Plan Note (Signed)
stable overall by history and exam, recent data reviewed with pt, and pt to continue medical treatment as before,  to f/u any worsening symptoms or concerns BP Readings from Last 3 Encounters:  12/30/15 116/82  12/09/15 130/88  12/03/15 158/104

## 2015-12-30 NOTE — Patient Instructions (Signed)
Please take all new medication as prescribed - the nausea medication, and lomotil as needed for diarrhea  Please continue all other medications as before, and refills have been done if requested.  Please have the pharmacy call with any other refills you may need.  Please keep your appointments with your specialists as you may have planned  You will be contacted regarding the referral for: neurology  You are given the work note as well.

## 2015-12-30 NOTE — Progress Notes (Signed)
Subjective:    Patient ID: Nicole Melendez, female    DOB: Dec 10, 1975, 40 y.o.   MRN: [id number]  HPI  Here with acute visit with persistent recurrent n/v and diarrhea with HA and sensitive to sound and lights; has felt feverish but none today;  imitrex no help for HA's.  Started jan 24, Appetite ok, wants to eat but often vomits with eating.  No wt loss documented here, though she thinks overall 10 lb wt loss by scale at home.  Denies worsening reflux, dysphagia, other bowel change such as constipatoin. or blood. Has had diarrheal stools about 4 times per day, immodium and pepto bismol but cant seem to keep them down to work. BP Readings from Last 3 Encounters:  12/30/15 116/82  12/09/15 130/88  12/03/15 158/104   Wt Readings from Last 3 Encounters:  12/30/15 328 lb (148.78 kg)  12/09/15 325 lb (147.419 kg)  12/03/15 325 lb (147.419 kg)  Seems to have sensitive tongue with pain,  ? Thrush.  Was never called per pt about neurology referral for HA, and more recently concussion. First day missed work was [employer].   Pt denies polydipsia, polyuria, blood sugars remain 75-100 per pt Past Medical History  Diagnosis Date  . Morbid obesity ([place])   . Peptic ulcer disease   . GERD (gastroesophageal reflux disease)   . Migraine   . Anxiety   . Anemia   . Hyperlipidemia   . Menorrhagia   . PNA (pneumonia) july 2011  . Depression   . Sarcoid ([place])     including hand per rheumatology-Dr. Amil Amen  . Allergic rhinitis, cause unspecified   . Varicose veins with pain   . Positive ANA (antinuclear antibody) 02/14/2012  . Shortness of breath     on exertion  . History of blood transfusion   . Sarcoidosis of lung ([place])   . Otitis media 06/28/2015  . Essential hypertension 08/08/2015  . Diabetes mellitus type II     steroid related, patient states "im no longer diabetic"   Past Surgical History  Procedure Laterality Date  . Uterine ablation  03/2010  . Wisdom tooth extraction    . Abdominal  hysterectomy      reports that she quit smoking about 3 years ago. Her smoking use included Cigarettes. She has a 20 pack-year smoking history. She does not have any smokeless tobacco history on file. She reports that she does not drink alcohol or use illicit drugs. family history includes Allergies in her mother; Bone cancer in her maternal aunt; Breast cancer in her maternal aunt; Diabetes in her father and mother; Heart attack in her mother; Heart disease in her father; Lung cancer in her maternal aunt; Ovarian cancer in her maternal aunt; Rheum arthritis in her father. There is no history of Other. Allergies  Allergen Reactions  . Azithromycin Hives    (z pak) hives  . Penicillins Shortness Of Breath and Swelling    Has patient had a PCN reaction causing immediate rash, facial/tongue/throat swelling, SOB or lightheadedness with hypotension: Yes Has patient had a PCN reaction causing severe rash involving mucus membranes or skin necrosis: No Has patient had a PCN reaction that required hospitalization No Has patient had a PCN reaction occurring within the last 10 years: No If all of the above answers are "NO", then may proceed with Cephalosporin use.  REACTION: swelling and difficulty breathing  . Shellfish Allergy Anaphylaxis  . Klonopin [Clonazepam] Other (See Comments)    Memory  difficulty  . Bee Venom   . Flagyl [Metronidazole]    Current Outpatient Prescriptions on File Prior to Visit  Medication Sig Dispense Refill  . acetaminophen (TYLENOL) 500 MG tablet Take 1,000 mg by mouth every 6 (six) hours as needed for mild pain or moderate pain.    Marland Kitchen ALPRAZolam (XANAX) 0.5 MG tablet TAKE 1 TABLET BY MOUTH THREE TIMES DAILY AS NEEDED 90 tablet 2  . B-D UF III MINI PEN NEEDLES 31G X 5 MM MISC USE FOUR TIMES DAILY FOR SLIDING SCALE AS NEEDED 200 each 0  . Blood Glucose Monitoring Suppl (ONE TOUCH ULTRA 2) W/DEVICE KIT Use as directed four times daily. Dx: E11.9 1 each 0  .  budesonide-formoterol (SYMBICORT) 160-4.5 MCG/ACT inhaler Inhale 2 puffs into the lungs 2 (two) times daily. 1 Inhaler 11  . cetirizine (ZYRTEC) 10 MG tablet Take 1 tablet (10 mg total) by mouth daily. 90 tablet 3  . cholecalciferol (VITAMIN D) 1000 UNITS tablet Take 5,000 Units by mouth daily.    . cyclobenzaprine (FLEXERIL) 5 MG tablet Take 1 tablet (5 mg total) by mouth at bedtime as needed for muscle spasms. 30 tablet 3  . folic acid (FOLVITE) 1 MG tablet Take 1 tablet (1 mg total) by mouth daily. 60 tablet 5  . gabapentin (NEURONTIN) 300 MG capsule Take 1 capsule (300 mg total) by mouth 3 (three) times daily. 90 capsule 5  . glucose blood (ONE TOUCH ULTRA TEST) test strip Use to check blood sugars four times a day Dx E11.9 400 each 11  . HYDROmorphone (DILAUDID) 4 MG tablet Take 4 mg by mouth every 4 (four) hours as needed for severe pain.    Marland Kitchen ibuprofen (ADVIL,MOTRIN) 600 MG tablet Take 1 tablet (600 mg total) by mouth every 6 (six) hours as needed. 30 tablet 0  . levalbuterol (XOPENEX HFA) 45 MCG/ACT inhaler Inhale 1-2 puffs into the lungs 2 (two) times daily. (Patient taking differently: Inhale 1-2 puffs into the lungs every 8 (eight) hours as needed for wheezing or shortness of breath. ) 15 g 11  . methotrexate (RHEUMATREX) 2.5 MG tablet Take 10 mg by mouth every Friday.     . naproxen (NAPROSYN) 500 MG tablet Take 1 tablet (500 mg total) by mouth 2 (two) times daily with a meal. 60 tablet 0  . Naproxen-Esomeprazole (VIMOVO) 500-20 MG TBEC Take 1 tablet by mouth 2 (two) times daily as needed. 60 tablet 1  . ondansetron (ZOFRAN) 4 MG tablet Take 1 tablet (4 mg total) by mouth every 8 (eight) hours as needed for nausea or vomiting. 15 tablet 0  . oxyCODONE-acetaminophen (PERCOCET/ROXICET) 5-325 MG tablet Take 2 tablets by mouth every 4 (four) hours as needed for severe pain. 20 tablet 0  . predniSONE (DELTASONE) 5 MG tablet Take 1 tablet (5 mg total) by mouth daily with breakfast. When current  prednisone pulse of 21m dosing ends 60 tablet 6  . sertraline (ZOLOFT) 100 MG tablet TAKE 2 TABLETS BY MOUTH DAILY 180 tablet 1  . SUMAtriptan (IMITREX) 100 MG tablet Take 1 tablet (100 mg total) by mouth every 2 (two) hours as needed for migraine. May repeat in 2 hours if headache persists or recurs. 10 tablet 11  . telmisartan (MICARDIS) 40 MG tablet Take 1 tablet (40 mg total) by mouth daily. 90 tablet 3  . tiZANidine (ZANAFLEX) 4 MG tablet Take 1 tablet (4 mg total) by mouth every 6 (six) hours as needed for muscle spasms. 40 tablet 0  .  vitamin B-12 (CYANOCOBALAMIN) 1000 MCG tablet Take 1,000 mcg by mouth daily.     No current facility-administered medications on file prior to visit.   Review of Systems  Constitutional: Negative for unusual diaphoresis or night sweats HENT: Negative for ringing in ear or discharge Eyes: Negative for double vision or worsening visual disturbance.  Respiratory: Negative for choking and stridor.   Gastrointestinal: Negative for vomiting or other signifcant bowel change Genitourinary: Negative for hematuria or change in urine volume.  Musculoskeletal: Negative for other MSK pain or swelling Skin: Negative for color change and worsening wound.  Neurological: Negative for tremors and numbness other than noted  Psychiatric/Behavioral: Negative for decreased concentration or agitation other than above       Objective:   Physical Exam BP 116/82 mmHg  Pulse 99  Temp(Src) 98.8 F (37.1 C) (Oral)  Resp 20  Wt 328 lb (148.78 kg)  SpO2 97%  LMP 07/18/2011 VS noted, fatigued, mild ill appearing Constitutional: Pt appears in no significant distress HENT: Head: NCAT.  Right Ear: External ear normal.  Left Ear: External ear normal.  Tongue: + thrush Eyes: . Pupils are equal, round, and reactive to light. Conjunctivae and EOM are normal Neck: Normal range of motion. Neck supple.  Cardiovascular: Normal rate and regular rhythm.   Pulmonary/Chest: Effort  normal and breath sounds without rales or wheezing.  Abd:  Soft, NT, ND, + BS Neurological: Pt is alert. Not confused , motor grossly intact Skin: Skin is warm. No rash, no LE edema Psychiatric: Pt behavior is normal. No agitation.     Assessment & Plan:

## 2015-12-30 NOTE — Assessment & Plan Note (Signed)
stable overall by history and exam, recent data reviewed with pt, and pt to continue medical treatment as before,  to f/u any worsening symptoms or concerns Lab Results  Component Value Date   HGBA1C 5.9 07/29/2015    

## 2016-01-28 ENCOUNTER — Ambulatory Visit (INDEPENDENT_AMBULATORY_CARE_PROVIDER_SITE_OTHER): Payer: BLUE CROSS/BLUE SHIELD | Admitting: Internal Medicine

## 2016-01-28 ENCOUNTER — Encounter: Payer: Self-pay | Admitting: Internal Medicine

## 2016-01-28 VITALS — BP 130/76 | HR 93 | Temp 98.6°F | Resp 20 | Wt 329.0 lb

## 2016-01-28 DIAGNOSIS — Z794 Long term (current) use of insulin: Secondary | ICD-10-CM

## 2016-01-28 DIAGNOSIS — I10 Essential (primary) hypertension: Secondary | ICD-10-CM

## 2016-01-28 DIAGNOSIS — F411 Generalized anxiety disorder: Secondary | ICD-10-CM

## 2016-01-28 DIAGNOSIS — R609 Edema, unspecified: Secondary | ICD-10-CM

## 2016-01-28 DIAGNOSIS — E1140 Type 2 diabetes mellitus with diabetic neuropathy, unspecified: Secondary | ICD-10-CM | POA: Diagnosis not present

## 2016-01-28 DIAGNOSIS — R600 Localized edema: Secondary | ICD-10-CM | POA: Insufficient documentation

## 2016-01-28 MED ORDER — POTASSIUM CHLORIDE ER 10 MEQ PO TBCR: EXTENDED_RELEASE_TABLET | ORAL | Status: DC

## 2016-01-28 MED ORDER — FUROSEMIDE 40 MG PO TABS: ORAL_TABLET | ORAL | Status: DC

## 2016-01-28 NOTE — Progress Notes (Signed)
Subjective:    Patient ID: Nicole Melendez, female    DOB: [birth year], 40 y.o.   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 No hx of CHF or pulm HTN , hepatorenal dz, anemia, thyroid.  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  Pt denies chest pain, increased sob or doe, wheezing, orthopnea, PND, increased LE swelling, palpitations, dizziness or syncope except for the above. Pt denies new neurological symptoms such as new headache, or facial or extremity weakness or numbness   Pt denies polydipsia, polyuria.  Wt has increased.  Denies forced daily fluids.  Stands at work for 8-10 hr days.  No hx of venous insufficiency, swelling not going down at night Wt Readings from Last 3 Encounters:  01/28/16 329 lb (149.233 kg)  12/30/15 328 lb (148.78 kg)  12/09/15 325 lb (147.419 kg)   Past Medical History  Diagnosis Date  . Morbid obesity ([place])   . Peptic ulcer disease   . GERD (gastroesophageal reflux disease)   . Migraine   . Anxiety   . Anemia   . Hyperlipidemia   . Menorrhagia   . PNA (pneumonia) july 2011  . Depression   . Sarcoid ([place])     including hand per rheumatology-Dr. Amil Amen  . Allergic rhinitis, cause unspecified   . Varicose veins with pain   . Positive ANA (antinuclear antibody) 02/14/2012  . Shortness of breath     on exertion  . History of blood transfusion   . Sarcoidosis of lung ([place])   . Otitis media 06/28/2015  . Essential hypertension 08/08/2015  . Diabetes mellitus type II     steroid related, patient states "im no longer diabetic"   Past Surgical History  Procedure Laterality Date  . Uterine ablation  03/2010  . Wisdom tooth extraction    . Abdominal hysterectomy      reports that she quit smoking about 3 years ago. Her smoking use included Cigarettes. She  has a 20 pack-year smoking history. She does not have any smokeless tobacco history on file. She reports that she does not drink alcohol or use illicit drugs. family history includes Allergies in her mother; Bone cancer in her maternal aunt; Breast cancer in her maternal aunt; Diabetes in her father and mother; Heart attack in her mother; Heart disease in her father; Lung cancer in her maternal aunt; Ovarian cancer in her maternal aunt; Rheum arthritis in her father. There is no history of Other. Allergies  Allergen Reactions  . Azithromycin Hives    (z pak) hives  . Penicillins Shortness Of Breath and Swelling    Has patient had a PCN reaction causing immediate rash, facial/tongue/throat swelling, SOB or lightheadedness with hypotension: Yes Has patient had a PCN reaction causing severe rash involving mucus membranes or skin necrosis: No Has patient had a PCN reaction that required hospitalization No Has patient had a PCN reaction occurring within the last 10 years: No If all of the above answers are "NO", then may proceed with Cephalosporin use.  REACTION: swelling and difficulty breathing  . Shellfish Allergy Anaphylaxis  . Klonopin [Clonazepam] Other (See Comments)    Memory difficulty  . Bee Venom   . Flagyl [Metronidazole]    Current Outpatient Prescriptions on File Prior to Visit  Medication Sig Dispense Refill  . acetaminophen (TYLENOL) 500 MG tablet Take 1,000 mg by mouth every 6 (six) hours as needed for mild pain or moderate pain.    Marland Kitchen ALPRAZolam (XANAX) 0.5 MG tablet TAKE 1 TABLET BY MOUTH THREE TIMES DAILY AS NEEDED 90 tablet 2  . B-D UF III MINI PEN NEEDLES 31G X 5 MM MISC USE FOUR TIMES DAILY FOR SLIDING SCALE AS NEEDED 200 each 0  . Blood Glucose Monitoring Suppl (ONE TOUCH ULTRA 2) W/DEVICE KIT Use as directed four times daily. Dx: E11.9 1 each 0  . budesonide-formoterol (SYMBICORT) 160-4.5 MCG/ACT inhaler Inhale 2 puffs into the lungs 2 (two) times daily. 1 Inhaler 11  .  cetirizine (ZYRTEC) 10 MG tablet Take 1 tablet (10 mg total) by mouth daily. 90 tablet 3  . cholecalciferol (VITAMIN D) 1000 UNITS tablet Take 5,000 Units by mouth daily.    . cyclobenzaprine (FLEXERIL) 5 MG tablet Take 1 tablet (5 mg total) by mouth at bedtime as needed for muscle spasms. 30 tablet 3  . diphenoxylate-atropine (LOMOTIL) 2.5-0.025 MG tablet Take 1 tablet by mouth 4 (four) times daily as needed for diarrhea or loose stools. 40 tablet 0  . folic acid (FOLVITE) 1 MG tablet Take 1 tablet (1 mg total) by mouth daily. 60 tablet 5  . gabapentin (NEURONTIN) 300 MG capsule Take 1 capsule (300 mg total) by mouth 3 (three) times daily. 90 capsule 5  . glucose blood (ONE TOUCH ULTRA TEST) test strip Use to check blood sugars four times a day Dx E11.9 400 each 11  . HYDROmorphone (DILAUDID) 4 MG tablet Take 4 mg by mouth every 4 (four) hours as needed for severe pain.    Marland Kitchen ibuprofen (ADVIL,MOTRIN) 600 MG tablet Take 1 tablet (600 mg total) by mouth every 6 (six) hours as needed. 30 tablet 0  . levalbuterol (XOPENEX HFA) 45 MCG/ACT inhaler Inhale 1-2 puffs into the lungs 2 (two) times daily. (Patient taking differently: Inhale 1-2 puffs into the lungs every 8 (eight) hours as needed for wheezing or shortness of breath. ) 15 g 11  . methotrexate (RHEUMATREX) 2.5 MG tablet Take 10 mg by mouth every Friday.     . naproxen (NAPROSYN) 500 MG tablet Take 1 tablet (500 mg total) by mouth 2 (two) times daily with a meal. 60 tablet 0  . Naproxen-Esomeprazole (VIMOVO) 500-20 MG TBEC Take 1 tablet by mouth 2 (two) times daily as needed. 60 tablet 1  . nystatin (MYCOSTATIN) 100000 UNIT/ML suspension Take 5 mLs (500,000 Units total) by mouth 4 (four) times daily. 60 mL 1  . ondansetron (ZOFRAN) 4 MG tablet Take 1 tablet (4 mg total) by mouth 4 (four) times daily as needed for nausea or vomiting. 50 tablet 0  . oxyCODONE-acetaminophen (PERCOCET/ROXICET) 5-325 MG tablet Take 2 tablets by mouth every 4 (four) hours  as needed for severe pain. 20 tablet 0  . predniSONE (DELTASONE) 5 MG tablet Take 1 tablet (5 mg total) by mouth daily with breakfast. When current prednisone pulse of 33m dosing ends 60 tablet 6  . sertraline (ZOLOFT) 100 MG tablet TAKE 2 TABLETS BY MOUTH DAILY 180 tablet 1  . SUMAtriptan (IMITREX) 100 MG tablet Take 1 tablet (100 mg total) by mouth every 2 (two) hours as needed for migraine. May repeat in 2 hours if headache persists or recurs. 10 tablet 11  . telmisartan (MICARDIS) 40 MG tablet Take 1 tablet (40 mg total) by mouth daily. 90 tablet 3  .  tiZANidine (ZANAFLEX) 4 MG tablet Take 1 tablet (4 mg total) by mouth every 6 (six) hours as needed for muscle spasms. 40 tablet 0  . vitamin B-12 (CYANOCOBALAMIN) 1000 MCG tablet Take 1,000 mcg by mouth daily.     No current facility-administered medications on file prior to visit.    Review of Systems  Constitutional: Negative for unusual diaphoresis or night sweats HENT: Negative for ear swelling or discharge Eyes: Negative for worsening visual haziness  Respiratory: Negative for choking and stridor.   Gastrointestinal: Negative for distension or worsening eructation Genitourinary: Negative for retention or change in urine volume.  Musculoskeletal: Negative for other MSK pain or swelling Skin: Negative for color change and worsening wound Neurological: Negative for tremors and numbness other than noted  Psychiatric/Behavioral: Negative for decreased concentration or agitation other than above       Objective:   Physical Exam BP 130/76 mmHg  Pulse 93  Temp(Src) 98.6 F (37 C) (Oral)  Resp 20  Wt 329 lb (149.233 kg)  SpO2 94%  LMP 07/18/2011 VS noted, not il appearing, morbid obese Constitutional: Pt appears in no apparent distress HENT: Head: NCAT.  Right Ear: External ear normal.  Left Ear: External ear normal.  Eyes: . Pupils are equal, round, and reactive to light. Conjunctivae and EOM are normal Neck: Normal range of  motion. Neck supple.  Cardiovascular: Normal rate and regular rhythm.   Pulmonary/Chest: Effort normal and breath sounds without rales or wheezing.  Abd:  Soft, NT, ND, + BS Neurological: Pt is alert. Not confused , motor grossly intact Skin: Skin is warm. No rash, 1-2+ LE edema to knees bilat Psychiatric: Pt behavior is normal. No agitation. , mild nervous  ecg - nsr, no acute changes      Assessment & Plan:

## 2016-01-28 NOTE — Assessment & Plan Note (Signed)
Mild situational today, overall stable overall by history and exam, recent data reviewed with pt, and pt to continue medical treatment as before,  to f/u any worsening symptoms or concerns Lab Results  Component Value Date   WBC 14.8* 11/21/2015   HGB 12.5 11/21/2015   HCT 37.5 11/21/2015   PLT 315 11/21/2015   GLUCOSE 159* 11/21/2015   CHOL 210* 10/17/2014   TRIG 101.0 10/17/2014   HDL 58.30 10/17/2014   LDLDIRECT 135.0 07/05/2011   LDLCALC 132* 10/17/2014   ALT 13 07/29/2015   AST 15 07/29/2015   NA 140 11/21/2015   K 3.9 11/21/2015   CL 103 11/21/2015   CREATININE 0.67 11/21/2015   BUN 10 11/21/2015   CO2 24 11/21/2015   TSH 1.03 07/29/2015   INR 1.3 11/17/2012   HGBA1C 5.9 07/29/2015   MICROALBUR <0.7 07/29/2015

## 2016-01-28 NOTE — Assessment & Plan Note (Addendum)
Etiology unclear, ecg reviewed, for echo r/o cv or pulm htn, and labs as documented, for lasix 40/K replacement daily with daily wts at home, ok to decrease to lasix 20 qd prn only when improved, f/u 3 wks  Note:  Total time for pt hx, exam, review of record with pt in the room, determination of diagnoses and plan for further eval and tx is > 40 min, with over 50% spent in coordination and counseling of patient

## 2016-01-28 NOTE — Patient Instructions (Addendum)
Please take all new medication as prescribed  - the lasix at 40 mg and the potassium pill only when taking the lasix  Please check your weight daily at home in the AM; the goal is for 1/2 - 1 lb off per day  OK to stop the potassium and take the lasix 20 mg AS NEEDED only for swelling when your swelling is improved  You are given the work note; please keep your legs elevated when sitting to help the swelling  Please continue all other medications as before, and refills have been done if requested.  Please have the pharmacy call with any other refills you may need.  Please continue your efforts at being more active, low cholesterol diet, and weight control.  Please keep your appointments with your specialists as you may have planned  Your EKG was OK  You will be contacted regarding the referral for: Echocardiogram  Please go to the LAB in the Basement (turn left off the elevator) for the tests to be done today  You will be contacted by phone if any changes need to be made immediately.  Otherwise, you will receive a letter about your results with an explanation, but please check with MyChart first.  Please remember to sign up for MyChart if you have not done so, as this will be important to you in the future with finding out test results, communicating by private email, and scheduling acute appointments online when needed.  Please return in 3 weeks, or sooner if needed

## 2016-01-28 NOTE — Progress Notes (Signed)
Pre visit review using our clinic review tool, if applicable. No additional management support is needed unless otherwise documented below in the visit note. 

## 2016-01-28 NOTE — Assessment & Plan Note (Signed)
stable overall by history and exam, recent data reviewed with pt, and pt to continue medical treatment as before,  to f/u any worsening symptoms or concerns BP Readings from Last 3 Encounters:  01/28/16 130/76  12/30/15 116/82  12/09/15 130/88

## 2016-01-28 NOTE — Assessment & Plan Note (Signed)
stable overall by history and exam, recent data reviewed with pt, and pt to continue medical treatment as before,  to f/u any worsening symptoms or concerns Lab Results  Component Value Date   HGBA1C 5.9 07/29/2015   For f/u lab

## 2016-02-04 ENCOUNTER — Other Ambulatory Visit: Payer: Self-pay | Admitting: Internal Medicine

## 2016-02-04 ENCOUNTER — Other Ambulatory Visit (INDEPENDENT_AMBULATORY_CARE_PROVIDER_SITE_OTHER): Payer: BLUE CROSS/BLUE SHIELD

## 2016-02-04 DIAGNOSIS — Z794 Long term (current) use of insulin: Secondary | ICD-10-CM | POA: Diagnosis not present

## 2016-02-04 DIAGNOSIS — E1140 Type 2 diabetes mellitus with diabetic neuropathy, unspecified: Secondary | ICD-10-CM

## 2016-02-04 LAB — HEPATIC FUNCTION PANEL
ALBUMIN: 3.9 g/dL (ref 3.5–5.2)
ALK PHOS: 137 U/L — AB (ref 39–117)
ALT: 51 U/L — ABNORMAL HIGH (ref 0–35)
AST: 47 U/L — AB (ref 0–37)
Bilirubin, Direct: 0 mg/dL (ref 0.0–0.3)
TOTAL PROTEIN: 7.2 g/dL (ref 6.0–8.3)
Total Bilirubin: 0.4 mg/dL (ref 0.2–1.2)

## 2016-02-04 LAB — BASIC METABOLIC PANEL
BUN: 8 mg/dL (ref 6–23)
CO2: 29 meq/L (ref 19–32)
Calcium: 9.6 mg/dL (ref 8.4–10.5)
Chloride: 100 mEq/L (ref 96–112)
Creatinine, Ser: 0.76 mg/dL (ref 0.40–1.20)
GFR: 107.29 mL/min (ref >60.00)
GLUCOSE: 123 mg/dL — AB (ref 70–99)
POTASSIUM: 3.3 meq/L — AB (ref 3.5–5.1)
SODIUM: 137 meq/L (ref 135–145)

## 2016-02-04 LAB — HEMOGLOBIN A1C: HEMOGLOBIN A1C: 7.1 % — AB (ref 4.6–6.5)

## 2016-02-04 LAB — LIPID PANEL
CHOLESTEROL: 207 mg/dL — AB (ref 0–200)
HDL: 52.5 mg/dL (ref >39.00)
LDL Cholesterol: 134 mg/dL — ABNORMAL HIGH (ref 0–99)
NonHDL: 154.94
TRIGLYCERIDES: 103 mg/dL (ref 0.0–149.0)
Total CHOL/HDL Ratio: 4
VLDL: 20.6 mg/dL (ref 0.0–40.0)

## 2016-02-04 MED ORDER — POTASSIUM CHLORIDE ER 10 MEQ PO TBCR: EXTENDED_RELEASE_TABLET | ORAL | Status: DC

## 2016-02-20 ENCOUNTER — Other Ambulatory Visit (HOSPITAL_COMMUNITY): Payer: Self-pay

## 2016-02-24 ENCOUNTER — Ambulatory Visit (INDEPENDENT_AMBULATORY_CARE_PROVIDER_SITE_OTHER): Payer: BLUE CROSS/BLUE SHIELD | Admitting: Internal Medicine

## 2016-02-24 VITALS — BP 140/82 | HR 93 | Resp 20 | Wt 324.0 lb

## 2016-02-24 DIAGNOSIS — R609 Edema, unspecified: Secondary | ICD-10-CM

## 2016-02-24 DIAGNOSIS — K219 Gastro-esophageal reflux disease without esophagitis: Secondary | ICD-10-CM | POA: Diagnosis not present

## 2016-02-24 DIAGNOSIS — I10 Essential (primary) hypertension: Secondary | ICD-10-CM | POA: Diagnosis not present

## 2016-02-24 DIAGNOSIS — I872 Venous insufficiency (chronic) (peripheral): Secondary | ICD-10-CM | POA: Insufficient documentation

## 2016-02-24 MED ORDER — PANTOPRAZOLE SODIUM 40 MG PO TBEC: 40.0000 mg | DELAYED_RELEASE_TABLET | Freq: Every day | ORAL | Status: DC

## 2016-02-24 MED ORDER — TELMISARTAN 80 MG PO TABS: 80.0000 mg | ORAL_TABLET | Freq: Every day | ORAL | Status: DC

## 2016-02-24 NOTE — Progress Notes (Signed)
Subjective:    Patient ID: Nicole Melendez, female    DOB: 10-21-1976, 40 y.o.   MRN: [id number]  HPI  Here to f/u recent peripheral edema, now improved with diuresis to trace pedal only, echo not yet done, Pt denies chest pain, increased sob or doe, wheezing, orthopnea, PND, increased LE swelling, palpitations, dizziness or syncope.  Pt denies new neurological symptoms such as new headache, or facial or extremity weakness or numbness  Pt denies polydipsia, polyuria,   Wt down 5 lbs Wt Readings from Last 3 Encounters:  02/24/16 324 lb (146.965 kg)  01/28/16 329 lb (149.233 kg)  12/30/15 328 lb (148.78 kg)  Has also had mild worsening reflux, but no abd pain, dysphagia, n/v, bowel change or blood. Past Medical History  Diagnosis Date  . Morbid obesity ([place])   . Peptic ulcer disease   . GERD (gastroesophageal reflux disease)   . Migraine   . Anxiety   . Anemia   . Hyperlipidemia   . Menorrhagia   . PNA (pneumonia) july 2011  . Depression   . Sarcoid ([place])     including hand per rheumatology-Dr. Amil Amen  . Allergic rhinitis, cause unspecified   . Varicose veins with pain   . Positive ANA (antinuclear antibody) 02/14/2012  . Shortness of breath     on exertion  . History of blood transfusion   . Sarcoidosis of lung ([place])   . Otitis media 06/28/2015  . Essential hypertension 08/08/2015  . Diabetes mellitus type II     steroid related, patient states "im no longer diabetic"   Past Surgical History  Procedure Laterality Date  . Uterine ablation  03/2010  . Wisdom tooth extraction    . Abdominal hysterectomy      reports that she quit smoking about 3 years ago. Her smoking use included Cigarettes. She has a 20 pack-year smoking history. She does not have any smokeless tobacco history on file. She reports that she does not drink alcohol or use illicit drugs. family history includes Allergies in her mother; Bone cancer in her maternal aunt; Breast cancer in her maternal aunt; Diabetes  in her father and mother; Heart attack in her mother; Heart disease in her father; Lung cancer in her maternal aunt; Ovarian cancer in her maternal aunt; Rheum arthritis in her father. There is no history of Other. Allergies  Allergen Reactions  . Azithromycin Hives    (z pak) hives  . Penicillins Shortness Of Breath and Swelling    Has patient had a PCN reaction causing immediate rash, facial/tongue/throat swelling, SOB or lightheadedness with hypotension: Yes Has patient had a PCN reaction causing severe rash involving mucus membranes or skin necrosis: No Has patient had a PCN reaction that required hospitalization No Has patient had a PCN reaction occurring within the last 10 years: No If all of the above answers are "NO", then may proceed with Cephalosporin use.  REACTION: swelling and difficulty breathing  . Shellfish Allergy Anaphylaxis  . Klonopin [Clonazepam] Other (See Comments)    Memory difficulty  . Bee Venom   . Flagyl [Metronidazole]    Current Outpatient Prescriptions on File Prior to Visit  Medication Sig Dispense Refill  . acetaminophen (TYLENOL) 500 MG tablet Take 1,000 mg by mouth every 6 (six) hours as needed for mild pain or moderate pain.    Marland Kitchen ALPRAZolam (XANAX) 0.5 MG tablet TAKE 1 TABLET BY MOUTH THREE TIMES DAILY AS NEEDED 90 tablet 2  . B-D UF III MINI  PEN NEEDLES 31G X 5 MM MISC USE FOUR TIMES DAILY FOR SLIDING SCALE AS NEEDED 200 each 0  . Blood Glucose Monitoring Suppl (ONE TOUCH ULTRA 2) W/DEVICE KIT Use as directed four times daily. Dx: E11.9 1 each 0  . budesonide-formoterol (SYMBICORT) 160-4.5 MCG/ACT inhaler Inhale 2 puffs into the lungs 2 (two) times daily. 1 Inhaler 11  . cetirizine (ZYRTEC) 10 MG tablet Take 1 tablet (10 mg total) by mouth daily. 90 tablet 3  . cholecalciferol (VITAMIN D) 1000 UNITS tablet Take 5,000 Units by mouth daily.    . cyclobenzaprine (FLEXERIL) 5 MG tablet Take 1 tablet (5 mg total) by mouth at bedtime as needed for muscle  spasms. 30 tablet 3  . diphenoxylate-atropine (LOMOTIL) 2.5-0.025 MG tablet Take 1 tablet by mouth 4 (four) times daily as needed for diarrhea or loose stools. 40 tablet 0  . folic acid (FOLVITE) 1 MG tablet Take 1 tablet (1 mg total) by mouth daily. 60 tablet 5  . furosemide (LASIX) 40 MG tablet 1/2 - 1 tab by mouth every day in the AM 30 tablet 11  . gabapentin (NEURONTIN) 300 MG capsule Take 1 capsule (300 mg total) by mouth 3 (three) times daily. 90 capsule 5  . glucose blood (ONE TOUCH ULTRA TEST) test strip Use to check blood sugars four times a day Dx E11.9 400 each 11  . HYDROmorphone (DILAUDID) 4 MG tablet Take 4 mg by mouth every 4 (four) hours as needed for severe pain.    Marland Kitchen ibuprofen (ADVIL,MOTRIN) 600 MG tablet Take 1 tablet (600 mg total) by mouth every 6 (six) hours as needed. 30 tablet 0  . levalbuterol (XOPENEX HFA) 45 MCG/ACT inhaler Inhale 1-2 puffs into the lungs 2 (two) times daily. (Patient taking differently: Inhale 1-2 puffs into the lungs every 8 (eight) hours as needed for wheezing or shortness of breath. ) 15 g 11  . methotrexate (RHEUMATREX) 2.5 MG tablet Take 10 mg by mouth every Friday.     . naproxen (NAPROSYN) 500 MG tablet Take 1 tablet (500 mg total) by mouth 2 (two) times daily with a meal. 60 tablet 0  . Naproxen-Esomeprazole (VIMOVO) 500-20 MG TBEC Take 1 tablet by mouth 2 (two) times daily as needed. 60 tablet 1  . nystatin (MYCOSTATIN) 100000 UNIT/ML suspension Take 5 mLs (500,000 Units total) by mouth 4 (four) times daily. 60 mL 1  . ondansetron (ZOFRAN) 4 MG tablet Take 1 tablet (4 mg total) by mouth 4 (four) times daily as needed for nausea or vomiting. 50 tablet 0  . oxyCODONE-acetaminophen (PERCOCET/ROXICET) 5-325 MG tablet Take 2 tablets by mouth every 4 (four) hours as needed for severe pain. 20 tablet 0  . potassium chloride (K-DUR) 10 MEQ tablet 2 tab by mouth per day only when taking the lasix 60 tablet 11  . predniSONE (DELTASONE) 5 MG tablet Take 1  tablet (5 mg total) by mouth daily with breakfast. When current prednisone pulse of 48m dosing ends 60 tablet 6  . sertraline (ZOLOFT) 100 MG tablet TAKE 2 TABLETS BY MOUTH DAILY 180 tablet 1  . SUMAtriptan (IMITREX) 100 MG tablet Take 1 tablet (100 mg total) by mouth every 2 (two) hours as needed for migraine. May repeat in 2 hours if headache persists or recurs. 10 tablet 11  . tiZANidine (ZANAFLEX) 4 MG tablet Take 1 tablet (4 mg total) by mouth every 6 (six) hours as needed for muscle spasms. 40 tablet 0  . vitamin B-12 (CYANOCOBALAMIN) 1000  MCG tablet Take 1,000 mcg by mouth daily.     No current facility-administered medications on file prior to visit.    Review of Systems  Constitutional: Negative for unusual diaphoresis or night sweats HENT: Negative for ear swelling or discharge Eyes: Negative for worsening visual haziness  Respiratory: Negative for choking and stridor.   Gastrointestinal: Negative for distension or worsening eructation Genitourinary: Negative for retention or change in urine volume.  Musculoskeletal: Negative for other MSK pain or swelling Skin: Negative for color change and worsening wound Neurological: Negative for tremors and numbness other than noted  Psychiatric/Behavioral: Negative for decreased concentration or agitation other than above       Objective:   Physical Exam BP 140/82 mmHg  Pulse 93  Resp 20  Wt 324 lb (146.965 kg)  SpO2 98%  LMP 07/18/2011 VS noted,  Constitutional: Pt appears in no apparent distress HENT: Head: NCAT.  Right Ear: External ear normal.  Left Ear: External ear normal.  Eyes: . Pupils are equal, round, and reactive to light. Conjunctivae and EOM are normal Neck: Normal range of motion. Neck supple.  Cardiovascular: Normal rate and regular rhythm.   Pulmonary/Chest: Effort normal and breath sounds without rales or wheezing.  Abd:  Soft, NT, ND, + BS Neurological: Pt is alert. Not confused , motor grossly intact Skin:  Skin is warm. No rash, trace bilat pedal LE edema Psychiatric: Pt behavior is normal. No agitation.   Lab Results  Component Value Date   WBC 14.8* 11/21/2015   HGB 12.5 11/21/2015   HCT 37.5 11/21/2015   PLT 315 11/21/2015   GLUCOSE 123* 02/04/2016   CHOL 207* 02/04/2016   TRIG 103.0 02/04/2016   HDL 52.50 02/04/2016   LDLDIRECT 135.0 07/05/2011   LDLCALC 134* 02/04/2016   ALT 51* 02/04/2016   AST 47* 02/04/2016   NA 137 02/04/2016   K 3.3* 02/04/2016   CL 100 02/04/2016   CREATININE 0.76 02/04/2016   BUN 8 02/04/2016   CO2 29 02/04/2016   TSH 1.03 07/29/2015   INR 1.3 11/17/2012   HGBA1C 7.1* 02/04/2016   MICROALBUR <0.7 07/29/2015       Assessment & Plan:

## 2016-02-24 NOTE — Progress Notes (Signed)
Pre visit review using our clinic review tool, if applicable. No additional management support is needed unless otherwise documented below in the visit note. 

## 2016-02-24 NOTE — Patient Instructions (Signed)
OK to increase the micardis to 80 mg per day  Please take all new medication as prescribed - the protonix for reflux  Please continue all other medications as before, and refills have been done if requested.  Please have the pharmacy call with any other refills you may need.  Please keep your appointments with your specialists as you may have planned

## 2016-02-26 ENCOUNTER — Ambulatory Visit: Payer: Self-pay | Admitting: Internal Medicine

## 2016-02-29 NOTE — Assessment & Plan Note (Signed)
Mild uncontrolled, for increased micardis to 80 mg,  to f/u any worsening symptoms or concerns BP Readings from Last 3 Encounters:  02/24/16 140/82  01/28/16 130/76  12/30/15 116/82

## 2016-02-29 NOTE — Assessment & Plan Note (Signed)
Mild to mod, for protonix asd, to f/u any worsening symptoms or concerns 

## 2016-02-29 NOTE — Assessment & Plan Note (Signed)
Improved with diuretic, 5 lbs wt loss o/w stable overall by history and exam, and pt to continue medical treatment as before,  to f/u any worsening symptoms or concerns, for echo when scheduled

## 2016-03-08 ENCOUNTER — Other Ambulatory Visit (HOSPITAL_COMMUNITY): Payer: Self-pay

## 2016-03-09 ENCOUNTER — Ambulatory Visit (INDEPENDENT_AMBULATORY_CARE_PROVIDER_SITE_OTHER): Payer: BLUE CROSS/BLUE SHIELD | Admitting: Family

## 2016-03-09 ENCOUNTER — Encounter: Payer: Self-pay | Admitting: Family

## 2016-03-09 VITALS — BP 132/94 | HR 88 | Temp 98.4°F | Resp 18 | Ht 67.0 in | Wt 323.1 lb

## 2016-03-09 DIAGNOSIS — R51 Headache: Secondary | ICD-10-CM | POA: Diagnosis not present

## 2016-03-09 DIAGNOSIS — R519 Headache, unspecified: Secondary | ICD-10-CM

## 2016-03-09 MED ORDER — KETOROLAC TROMETHAMINE 60 MG/2ML IM SOLN
60.0000 mg | Freq: Once | INTRAMUSCULAR | Status: AC
Start: 2016-03-09 — End: 2016-03-09
  Administered 2016-03-09: 60 mg via INTRAMUSCULAR

## 2016-03-09 MED ORDER — METHYLPREDNISOLONE ACETATE 80 MG/ML IJ SUSP
80.0000 mg | Freq: Once | INTRAMUSCULAR | Status: AC
  Administered 2016-03-09: 80 mg via INTRAMUSCULAR

## 2016-03-09 NOTE — Progress Notes (Signed)
Pre visit review using our clinic review tool, if applicable. No additional management support is needed unless otherwise documented below in the visit note. 

## 2016-03-09 NOTE — Assessment & Plan Note (Addendum)
Neurological exam is benign. Symptoms consistent with a generalized/mixed headache. No neck pain or worst headache with low concern for intracranial pathology.  In office injection of Depo-Medrol and Toradol provided. If symptoms worsen or do not improve, we will send additional referral to neurology.

## 2016-03-09 NOTE — Progress Notes (Signed)
Subjective:    Patient ID: Nicole Melendez, female    DOB: 1976/10/23, 40 y.o.   MRN: [id number]  Chief Complaint  Patient presents with  . Headache    having headaches that are bad, states they have been back for a week, on a pain scale she states her pain is a 9    HPI:  Nicole Melendez is a 40 y.o. female who  has a past medical history of Morbid obesity ([place]); Peptic ulcer disease; GERD (gastroesophageal reflux disease); Migraine; Anxiety; Anemia; Hyperlipidemia; Menorrhagia; PNA (pneumonia) (july 2011); Depression; Sarcoid ([place]); Allergic rhinitis, cause unspecified; Varicose veins with pain; Positive ANA (antinuclear antibody) (02/14/2012); Shortness of breath; History of blood transfusion; Sarcoidosis of lung ([place]); Otitis media (06/28/2015); Essential hypertension (08/08/2015); and Diabetes mellitus type II. and presents today for an office visit.    Recently seen in the office for postconcussion syndrome and non-intractable headaches that were sensitive to light and sound and refractory to Imitrex. She was referred to neurology, however per patient she never received a phone call to schedule an appointment. Associated symptom of a new onset headache started about 1 week ago and has been refractory to the modifying factors of Imitrex and Tylenol. Pain is described as a constant ache with sensitivity to light and sound with nausea and vomiting. Denies any changes in vision. Severity of the pain has affected her lifestyle and is rated a 9/10. Denies worst headache of her life. States she has been out of work since Thursday because of the headaches.   Allergies  Allergen Reactions  . Azithromycin Hives    (z pak) hives  . Penicillins Shortness Of Breath and Swelling    Has patient had a PCN reaction causing immediate rash, facial/tongue/throat swelling, SOB or lightheadedness with hypotension: Yes Has patient had a PCN reaction causing severe rash involving mucus membranes or skin necrosis:  No Has patient had a PCN reaction that required hospitalization No Has patient had a PCN reaction occurring within the last 10 years: No If all of the above answers are "NO", then may proceed with Cephalosporin use.  REACTION: swelling and difficulty breathing  . Shellfish Allergy Anaphylaxis  . Klonopin [Clonazepam] Other (See Comments)    Memory difficulty  . Bee Venom   . Flagyl [Metronidazole]      Current Outpatient Prescriptions on File Prior to Visit  Medication Sig Dispense Refill  . acetaminophen (TYLENOL) 500 MG tablet Take 1,000 mg by mouth every 6 (six) hours as needed for mild pain or moderate pain.    Marland Kitchen ALPRAZolam (XANAX) 0.5 MG tablet TAKE 1 TABLET BY MOUTH THREE TIMES DAILY AS NEEDED 90 tablet 2  . B-D UF III MINI PEN NEEDLES 31G X 5 MM MISC USE FOUR TIMES DAILY FOR SLIDING SCALE AS NEEDED 200 each 0  . Blood Glucose Monitoring Suppl (ONE TOUCH ULTRA 2) W/DEVICE KIT Use as directed four times daily. Dx: E11.9 1 each 0  . budesonide-formoterol (SYMBICORT) 160-4.5 MCG/ACT inhaler Inhale 2 puffs into the lungs 2 (two) times daily. 1 Inhaler 11  . cetirizine (ZYRTEC) 10 MG tablet Take 1 tablet (10 mg total) by mouth daily. 90 tablet 3  . cholecalciferol (VITAMIN D) 1000 UNITS tablet Take 5,000 Units by mouth daily.    . cyclobenzaprine (FLEXERIL) 5 MG tablet Take 1 tablet (5 mg total) by mouth at bedtime as needed for muscle spasms. 30 tablet 3  . diphenoxylate-atropine (LOMOTIL) 2.5-0.025 MG tablet Take 1 tablet by mouth 4 (  four) times daily as needed for diarrhea or loose stools. 40 tablet 0  . folic acid (FOLVITE) 1 MG tablet Take 1 tablet (1 mg total) by mouth daily. 60 tablet 5  . furosemide (LASIX) 40 MG tablet 1/2 - 1 tab by mouth every day in the AM 30 tablet 11  . gabapentin (NEURONTIN) 300 MG capsule Take 1 capsule (300 mg total) by mouth 3 (three) times daily. 90 capsule 5  . glucose blood (ONE TOUCH ULTRA TEST) test strip Use to check blood sugars four times a day  Dx E11.9 400 each 11  . HYDROmorphone (DILAUDID) 4 MG tablet Take 4 mg by mouth every 4 (four) hours as needed for severe pain.    Marland Kitchen ibuprofen (ADVIL,MOTRIN) 600 MG tablet Take 1 tablet (600 mg total) by mouth every 6 (six) hours as needed. 30 tablet 0  . levalbuterol (XOPENEX HFA) 45 MCG/ACT inhaler Inhale 1-2 puffs into the lungs 2 (two) times daily. (Patient taking differently: Inhale 1-2 puffs into the lungs every 8 (eight) hours as needed for wheezing or shortness of breath. ) 15 g 11  . methotrexate (RHEUMATREX) 2.5 MG tablet Take 10 mg by mouth every Friday.     . naproxen (NAPROSYN) 500 MG tablet Take 1 tablet (500 mg total) by mouth 2 (two) times daily with a meal. 60 tablet 0  . Naproxen-Esomeprazole (VIMOVO) 500-20 MG TBEC Take 1 tablet by mouth 2 (two) times daily as needed. 60 tablet 1  . nystatin (MYCOSTATIN) 100000 UNIT/ML suspension Take 5 mLs (500,000 Units total) by mouth 4 (four) times daily. 60 mL 1  . ondansetron (ZOFRAN) 4 MG tablet Take 1 tablet (4 mg total) by mouth 4 (four) times daily as needed for nausea or vomiting. 50 tablet 0  . oxyCODONE-acetaminophen (PERCOCET/ROXICET) 5-325 MG tablet Take 2 tablets by mouth every 4 (four) hours as needed for severe pain. 20 tablet 0  . pantoprazole (PROTONIX) 40 MG tablet Take 1 tablet (40 mg total) by mouth daily. 90 tablet 3  . potassium chloride (K-DUR) 10 MEQ tablet 2 tab by mouth per day only when taking the lasix 60 tablet 11  . predniSONE (DELTASONE) 5 MG tablet Take 1 tablet (5 mg total) by mouth daily with breakfast. When current prednisone pulse of 17m dosing ends 60 tablet 6  . sertraline (ZOLOFT) 100 MG tablet TAKE 2 TABLETS BY MOUTH DAILY 180 tablet 1  . SUMAtriptan (IMITREX) 100 MG tablet Take 1 tablet (100 mg total) by mouth every 2 (two) hours as needed for migraine. May repeat in 2 hours if headache persists or recurs. 10 tablet 11  . telmisartan (MICARDIS) 80 MG tablet Take 1 tablet (80 mg total) by mouth daily. 90  tablet 3  . tiZANidine (ZANAFLEX) 4 MG tablet Take 1 tablet (4 mg total) by mouth every 6 (six) hours as needed for muscle spasms. 40 tablet 0  . vitamin B-12 (CYANOCOBALAMIN) 1000 MCG tablet Take 1,000 mcg by mouth daily.     No current facility-administered medications on file prior to visit.     Review of Systems  Constitutional: Negative for fever and chills.  Eyes: Positive for photophobia.  Respiratory: Negative for chest tightness and stridor.   Cardiovascular: Negative for chest pain, palpitations and leg swelling.  Musculoskeletal: Negative for neck pain and neck stiffness.  Neurological: Positive for headaches.      Objective:    BP 132/94 mmHg  Pulse 88  Temp(Src) 98.4 F (36.9 C) (Oral)  Resp  18  Ht '5\' 7"'  (1.702 m)  Wt 323 lb 1.9 oz (146.566 kg)  BMI 50.60 kg/m2  SpO2 99%  LMP 07/18/2011 Nursing note and vital signs reviewed.  Physical Exam  Constitutional: She is oriented to person, place, and time. She appears well-developed and well-nourished.  Non-toxic appearance. She does not have a sickly appearance. No distress.  Eyes: Conjunctivae and EOM are normal. Pupils are equal, round, and reactive to light.  Cardiovascular: Normal rate, regular rhythm, normal heart sounds and intact distal pulses.   Pulmonary/Chest: Effort normal and breath sounds normal.  Neurological: She is alert and oriented to person, place, and time. No cranial nerve deficit.  Skin: Skin is warm and dry.  Psychiatric: She has a normal mood and affect. Her behavior is normal. Judgment and thought content normal.       Assessment & Plan:   Problem List Items Addressed This Visit      Other   Mixed headache - Primary    Neurological exam is benign. Symptoms consistent with a generalized/mixed headache. No neck pain or worst headache with low concern for intracranial pathology.  In office injection of Depo-Medrol and Toradol provided. If symptoms worsen or do not improve, we will send  additional referral to neurology.      Relevant Medications   methylPREDNISolone acetate (DEPO-MEDROL) injection 80 mg (Completed)   ketorolac (TORADOL) injection 60 mg (Completed)       I am having Ms. Marland maintain her B-D UF III MINI PEN NEEDLES, levalbuterol, budesonide-formoterol, cyclobenzaprine, vitamin B-12, cholecalciferol, methotrexate, predniSONE, folic acid, ONE TOUCH ULTRA 2, glucose blood, SUMAtriptan, HYDROmorphone, acetaminophen, sertraline, cetirizine, gabapentin, oxyCODONE-acetaminophen, ibuprofen, tiZANidine, Naproxen-Esomeprazole, naproxen, ALPRAZolam, diphenoxylate-atropine, ondansetron, nystatin, furosemide, potassium chloride, telmisartan, and pantoprazole. We administered methylPREDNISolone acetate and ketorolac.   Meds ordered this encounter  Medications  . methylPREDNISolone acetate (DEPO-MEDROL) injection 80 mg    Sig:   . ketorolac (TORADOL) injection 60 mg    Sig:      Follow-up: Return if symptoms worsen or fail to improve.  Mauricio Po, FNP

## 2016-03-09 NOTE — Patient Instructions (Addendum)
Thank you for choosing [facility].  Summary/Instructions:   If your symptoms worsen or fail to improve, please contact our office for further instruction, or in case of emergency go directly to the emergency room at the closest medical facility.    General Headache Without Cause A headache is pain or discomfort felt around the head or neck area. The specific cause of a headache may not be found. There are many causes and types of headaches. A few common ones are:  Tension headaches.  Migraine headaches.  Cluster headaches.  Chronic daily headaches. HOME CARE INSTRUCTIONS  Watch your condition for any changes. Take these steps to help with your condition: Managing Pain  Take over-the-counter and prescription medicines only as told by your health care provider.  Lie down in a dark, quiet room when you have a headache.  If directed, apply ice to the head and neck area:  Put ice in a plastic bag.  Place a towel between your skin and the bag.  Leave the ice on for 20 minutes, 2-3 times per day.  Use a heating pad or hot shower to apply heat to the head and neck area as told by your health care provider.  Keep lights dim if bright lights bother you or make your headaches worse. Eating and Drinking  Eat meals on a regular schedule.  Limit alcohol use.  Decrease the amount of caffeine you drink, or stop drinking caffeine. General Instructions  Keep all follow-up visits as told by your health care provider. This is important.  Keep a headache journal to help find out what may trigger your headaches. For example, write down:  What you eat and drink.  How much sleep you get.  Any change to your diet or medicines.  Try massage or other relaxation techniques.  Limit stress.  Sit up straight, and do not tense your muscles.  Do not use tobacco products, including cigarettes, chewing tobacco, or e-cigarettes. If you need help quitting, ask your health care  provider.  Exercise regularly as told by your health care provider.  Sleep on a regular schedule. Get 7-9 hours of sleep, or the amount recommended by your health care provider. SEEK MEDICAL CARE IF:   Your symptoms are not helped by medicine.  You have a headache that is different from the usual headache.  You have nausea or you vomit.  You have a fever. SEEK IMMEDIATE MEDICAL CARE IF:   Your headache becomes severe.  You have repeated vomiting.  You have a stiff neck.  You have a loss of vision.  You have problems with speech.  You have pain in the eye or ear.  You have muscular weakness or loss of muscle control.  You lose your balance or have trouble walking.  You feel faint or pass out.  You have confusion.   This information is not intended to replace advice given to you by your health care provider. Make sure you discuss any questions you have with your health care provider.   Document Released: 10/11/2005 Document Revised: 07/02/2015 Document Reviewed: 02/03/2015 Elsevier Interactive Patient Education [employer].

## 2016-03-11 ENCOUNTER — Ambulatory Visit (INDEPENDENT_AMBULATORY_CARE_PROVIDER_SITE_OTHER): Payer: BLUE CROSS/BLUE SHIELD | Admitting: Internal Medicine

## 2016-03-11 VITALS — BP 116/78 | HR 88 | Resp 20 | Wt 325.0 lb

## 2016-03-11 DIAGNOSIS — R609 Edema, unspecified: Secondary | ICD-10-CM | POA: Diagnosis not present

## 2016-03-11 DIAGNOSIS — J309 Allergic rhinitis, unspecified: Secondary | ICD-10-CM

## 2016-03-11 DIAGNOSIS — R51 Headache: Secondary | ICD-10-CM

## 2016-03-11 DIAGNOSIS — I10 Essential (primary) hypertension: Secondary | ICD-10-CM

## 2016-03-11 DIAGNOSIS — R42 Dizziness and giddiness: Secondary | ICD-10-CM | POA: Insufficient documentation

## 2016-03-11 DIAGNOSIS — R519 Headache, unspecified: Secondary | ICD-10-CM

## 2016-03-11 MED ORDER — AZELASTINE HCL 0.1 % NA SOLN: 2.0000 | Freq: Two times a day (BID) | NASAL | Status: DC

## 2016-03-11 MED ORDER — TELMISARTAN 40 MG PO TABS: 40.0000 mg | ORAL_TABLET | Freq: Every day | ORAL | Status: DC

## 2016-03-11 NOTE — Progress Notes (Signed)
Pre visit review using our clinic review tool, if applicable. No additional management support is needed unless otherwise documented below in the visit note. 

## 2016-03-11 NOTE — Progress Notes (Signed)
   Subjective:    Patient ID: Nicole Melendez, female    DOB: August 07, 1976, 40 y.o.   MRN: [id number]  HPI  Here to f/u. Pt denies chest pain, increased sob or doe, wheezing, orthopnea, PND, increased LE swelling, palpitations, or syncope but still feels some positional lightheaded on standing with the micardis 80 mg, even with holding the diuretic for 1 wk.  Was able to do this as the edema resolved.  Wt is downoverall. Wt Readings from Last 3 Encounters:  03/11/16 325 lb (147.419 kg)  03/09/16 323 lb 1.9 oz (146.566 kg)  02/24/16 324 lb (146.965 kg)   01-28-2016        329 Also incidentally, Does have several wks ongoing nasal allergy symptoms with clearish congestion, itch and sneezing, without fever, pain, ST, cough, swelling or wheezing.   Pt denies fever, wt loss, night sweats, loss of appetite, or other constitutional symptoms  Pt denies new neurological symptoms such as new headache, or facial or extremity weakness or numbness   Pt denies polydipsia, polyuria,  Review of Systems  Constitutional: Negative for unusual diaphoresis or night sweats HENT: Negative for ear swelling or discharge Eyes: Negative for worsening visual haziness  Respiratory: Negative for choking and stridor.   Gastrointestinal: Negative for distension or worsening eructation Genitourinary: Negative for retention or change in urine volume.  Musculoskeletal: Negative for other MSK pain or swelling Skin: Negative for color change and worsening wound Neurological: Negative for tremors and numbness other than noted  Psychiatric/Behavioral: Negative for decreased concentration or agitation other than above       Objective:   Physical Exam BP 116/78 mmHg  Pulse 88  Resp 20  Wt 325 lb (147.419 kg)  SpO2 98%  LMP 07/18/2011 VS noted,  Constitutional: Pt appears in no apparent distress HENT: Head: NCAT.  Right Ear: External ear normal.  Left Ear: External ear normal.  Bilat tm's with mild erythema.  Max sinus areas  non tender.  Pharynx with mild erythema, no exudate Eyes: . Pupils are equal, round, and reactive to light. Conjunctivae and EOM are normal Neck: Normal range of motion. Neck supple.  Cardiovascular: Normal rate and regular rhythm.   Pulmonary/Chest: Effort normal and breath sounds without rales or wheezing.  Neurological: Pt is alert. Not confused , motor grossly intact Skin: Skin is warm. No rash, no LE edema Psychiatric: Pt behavior is normal. No agitation.     Assessment & Plan:

## 2016-03-11 NOTE — Patient Instructions (Signed)
You had the steroid shot today  OK to decrease the micardis to 40 mg per day (a new prescription has been sent, but you can cut the 80 mg pills in half to use them up)  Please take all new medication as prescribed - the astelin nasal spray for allergies (sent to the pharmacy)  Please continue all other medications as before, and refills have been done if requested.  Please have the pharmacy call with any other refills you may need.  Please keep your appointments with your specialists as you may have planned  You are given the work note for tonight

## 2016-03-14 NOTE — Assessment & Plan Note (Signed)
Borderline low today, likely overcontrolled and symptomatic, o/w stable overall by history and exam, recent data reviewed with pt, and pt to decrease the micardis to 40 qd,  to f/u any worsening symptoms or concerns BP Readings from Last 3 Encounters:  03/11/16 116/78  03/09/16 132/94  02/24/16 140/82

## 2016-03-14 NOTE — Assessment & Plan Note (Signed)
Improved,  to f/u any worsening symptoms or concerns 

## 2016-03-14 NOTE — Assessment & Plan Note (Signed)
Resolved , for diuretic prn only

## 2016-03-14 NOTE — Assessment & Plan Note (Signed)
Mild to mod seasonal flare, for depomedrol IM,  to f/u any worsening symptoms or concerns

## 2016-03-16 ENCOUNTER — Ambulatory Visit: Payer: Self-pay | Admitting: Internal Medicine

## 2016-03-17 ENCOUNTER — Ambulatory Visit: Payer: Self-pay | Admitting: Internal Medicine

## 2016-03-23 ENCOUNTER — Other Ambulatory Visit (HOSPITAL_COMMUNITY): Payer: Self-pay

## 2016-03-30 ENCOUNTER — Telehealth: Payer: Self-pay | Admitting: Internal Medicine

## 2016-03-30 NOTE — Telephone Encounter (Signed)
Pt requested a copy of OV 11/26/2015. Printed and mailed, pt advised of same
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
## 2016-04-02 ENCOUNTER — Other Ambulatory Visit: Payer: Self-pay | Admitting: Internal Medicine

## 2016-04-06 ENCOUNTER — Ambulatory Visit (INDEPENDENT_AMBULATORY_CARE_PROVIDER_SITE_OTHER): Payer: BLUE CROSS/BLUE SHIELD | Admitting: Internal Medicine

## 2016-04-06 DIAGNOSIS — R42 Dizziness and giddiness: Secondary | ICD-10-CM

## 2016-04-06 DIAGNOSIS — Z0289 Encounter for other administrative examinations: Secondary | ICD-10-CM

## 2016-04-07 ENCOUNTER — Encounter: Payer: Self-pay | Admitting: Family

## 2016-04-07 ENCOUNTER — Ambulatory Visit (INDEPENDENT_AMBULATORY_CARE_PROVIDER_SITE_OTHER): Payer: BLUE CROSS/BLUE SHIELD | Admitting: Family

## 2016-04-07 VITALS — BP 118/80 | HR 95 | Temp 98.0°F | Resp 16 | Ht 67.0 in | Wt 323.0 lb

## 2016-04-07 DIAGNOSIS — D860 Sarcoidosis of lung: Secondary | ICD-10-CM | POA: Diagnosis not present

## 2016-04-07 DIAGNOSIS — M47817 Spondylosis without myelopathy or radiculopathy, lumbosacral region: Secondary | ICD-10-CM | POA: Diagnosis not present

## 2016-04-07 DIAGNOSIS — R519 Headache, unspecified: Secondary | ICD-10-CM

## 2016-04-07 DIAGNOSIS — R51 Headache: Secondary | ICD-10-CM | POA: Diagnosis not present

## 2016-04-07 DIAGNOSIS — M5136 Other intervertebral disc degeneration, lumbar region: Secondary | ICD-10-CM | POA: Diagnosis not present

## 2016-04-07 DIAGNOSIS — M545 Low back pain: Secondary | ICD-10-CM | POA: Diagnosis not present

## 2016-04-07 DIAGNOSIS — M79671 Pain in right foot: Secondary | ICD-10-CM | POA: Diagnosis not present

## 2016-04-07 MED ORDER — ONDANSETRON HCL 4 MG PO TABS: 4.0000 mg | ORAL_TABLET | Freq: Four times a day (QID) | ORAL | Status: DC | PRN

## 2016-04-07 MED ORDER — TOPIRAMATE 25 MG PO TABS: ORAL_TABLET | ORAL | Status: DC

## 2016-04-07 MED ORDER — SUMATRIPTAN SUCCINATE 100 MG PO TABS: ORAL_TABLET | ORAL | Status: DC

## 2016-04-07 NOTE — Assessment & Plan Note (Signed)
Symptoms and exam remain consistent with mixed headaches although favoring migraine. Declines in office injection of Toradol and Depo-Medrol. Refill Imitrex and ondansetron. Start Topamax. Discussed a headache/food journal to determine potential causes. Denies worse headache of life with little concern for intracranial pathology. Follow-up neurology referral or for headache worsening.

## 2016-04-07 NOTE — Progress Notes (Signed)
Subjective:    Patient ID: [name], female    DOB: 09-27-1976, 40 y.o.   MRN: [id number]  Chief Complaint  Patient presents with  . Headache    headaches x3 weeks, can not eat, always throwing it up, since starting back work it has her back and feet hurting, has been having nausea    HPI:  SEVERINA SYKORA is a 40 y.o. female who  has a past medical history of Morbid obesity ([place]); Peptic ulcer disease; GERD (gastroesophageal reflux disease); Migraine; Anxiety; Anemia; Hyperlipidemia; Menorrhagia; PNA (pneumonia) (july 2011); Depression; Sarcoid ([place]); Allergic rhinitis, cause unspecified; Varicose veins with pain; Positive ANA (antinuclear antibody) (02/14/2012); Shortness of breath; History of blood transfusion; Sarcoidosis of lung ([place]); Otitis media (06/28/2015); Essential hypertension (08/08/2015); and Diabetes mellitus type II. and presents today for an office visit.   1.) Headaches - Previously diagnosed with migraine headaches and last seen in the office with a mixed headache that was treated with an injection of Depo-Medrol and Toradol which helped with her symptoms. She returns today with the associated symptom of a headache that has been going on for approximately 3 weeks. Headaches are described as dull and throbbing with sensitivity to light and sound as well as nausea and vomiting. Modifying factors include Tylenol and Aleve which has not helped. States she completed an eye exam. Reports that she is able to eat however, any hot food will cause her to vomit. Denies worst headache of life. States that she has had 2 headache free days since her last office visit. Does have a mild change in vision.   Allergies  Allergen Reactions  . Azithromycin Hives    (z pak) hives  . Penicillins Shortness Of Breath and Swelling    Has patient had a PCN reaction causing immediate rash, facial/tongue/throat swelling, SOB or lightheadedness with hypotension: Yes Has patient had a PCN reaction  causing severe rash involving mucus membranes or skin necrosis: No Has patient had a PCN reaction that required hospitalization No Has patient had a PCN reaction occurring within the last 10 years: No If all of the above answers are "NO", then may proceed with Cephalosporin use.  REACTION: swelling and difficulty breathing  . Shellfish Allergy Anaphylaxis  . Klonopin [Clonazepam] Other (See Comments)    Memory difficulty  . Bee Venom   . Flagyl [Metronidazole]      Current Outpatient Prescriptions on File Prior to Visit  Medication Sig Dispense Refill  . acetaminophen (TYLENOL) 500 MG tablet Take 1,000 mg by mouth every 6 (six) hours as needed for mild pain or moderate pain.    Marland Kitchen ALPRAZolam (XANAX) 0.5 MG tablet TAKE 1 TABLET BY MOUTH THREE TIMES DAILY AS NEEDED 90 tablet 2  . azelastine (ASTELIN) 0.1 % nasal spray Place 2 sprays into both nostrils 2 (two) times daily. Use in each nostril as directed 30 mL 12  . B-D UF III MINI PEN NEEDLES 31G X 5 MM MISC USE FOUR TIMES DAILY FOR SLIDING SCALE AS NEEDED 200 each 0  . Blood Glucose Monitoring Suppl (ONE TOUCH ULTRA 2) W/DEVICE KIT Use as directed four times daily. Dx: E11.9 1 each 0  . budesonide-formoterol (SYMBICORT) 160-4.5 MCG/ACT inhaler Inhale 2 puffs into the lungs 2 (two) times daily. 1 Inhaler 11  . cetirizine (ZYRTEC) 10 MG tablet Take 1 tablet (10 mg total) by mouth daily. 90 tablet 3  . cholecalciferol (VITAMIN D) 1000 UNITS tablet Take 5,000 Units by mouth daily.    Marland Kitchen  cyclobenzaprine (FLEXERIL) 5 MG tablet Take 1 tablet (5 mg total) by mouth at bedtime as needed for muscle spasms. 30 tablet 3  . diphenoxylate-atropine (LOMOTIL) 2.5-0.025 MG tablet Take 1 tablet by mouth 4 (four) times daily as needed for diarrhea or loose stools. 40 tablet 0  . folic acid (FOLVITE) 1 MG tablet Take 1 tablet (1 mg total) by mouth daily. 60 tablet 5  . furosemide (LASIX) 40 MG tablet 1/2 - 1 tab by mouth every day in the AM 30 tablet 11  .  gabapentin (NEURONTIN) 300 MG capsule Take 1 capsule (300 mg total) by mouth 3 (three) times daily. 90 capsule 5  . glucose blood (ONE TOUCH ULTRA TEST) test strip Use to check blood sugars four times a day Dx E11.9 400 each 11  . HYDROmorphone (DILAUDID) 4 MG tablet Take 4 mg by mouth every 4 (four) hours as needed for severe pain.    Marland Kitchen ibuprofen (ADVIL,MOTRIN) 600 MG tablet Take 1 tablet (600 mg total) by mouth every 6 (six) hours as needed. 30 tablet 0  . levalbuterol (XOPENEX HFA) 45 MCG/ACT inhaler Inhale 1-2 puffs into the lungs 2 (two) times daily. (Patient taking differently: Inhale 1-2 puffs into the lungs every 8 (eight) hours as needed for wheezing or shortness of breath. ) 15 g 11  . methotrexate (RHEUMATREX) 2.5 MG tablet Take 10 mg by mouth every Friday.     . naproxen (NAPROSYN) 500 MG tablet Take 1 tablet (500 mg total) by mouth 2 (two) times daily with a meal. 60 tablet 0  . naproxen (NAPROSYN) 500 MG tablet TAKE 1 TABLET BY MOUTH TWICE DAILY WITH A MEAL 60 tablet 0  . Naproxen-Esomeprazole (VIMOVO) 500-20 MG TBEC Take 1 tablet by mouth 2 (two) times daily as needed. 60 tablet 1  . nystatin (MYCOSTATIN) 100000 UNIT/ML suspension Take 5 mLs (500,000 Units total) by mouth 4 (four) times daily. 60 mL 1  . oxyCODONE-acetaminophen (PERCOCET/ROXICET) 5-325 MG tablet Take 2 tablets by mouth every 4 (four) hours as needed for severe pain. 20 tablet 0  . pantoprazole (PROTONIX) 40 MG tablet Take 1 tablet (40 mg total) by mouth daily. 90 tablet 3  . potassium chloride (K-DUR) 10 MEQ tablet 2 tab by mouth per day only when taking the lasix 60 tablet 11  . predniSONE (DELTASONE) 5 MG tablet Take 1 tablet (5 mg total) by mouth daily with breakfast. When current prednisone pulse of 71m dosing ends 60 tablet 6  . sertraline (ZOLOFT) 100 MG tablet TAKE 2 TABLETS BY MOUTH DAILY 180 tablet 1  . telmisartan (MICARDIS) 40 MG tablet Take 1 tablet (40 mg total) by mouth daily. 90 tablet 3  . tiZANidine  (ZANAFLEX) 4 MG tablet Take 1 tablet (4 mg total) by mouth every 6 (six) hours as needed for muscle spasms. 40 tablet 0  . vitamin B-12 (CYANOCOBALAMIN) 1000 MCG tablet Take 1,000 mcg by mouth daily.     No current facility-administered medications on file prior to visit.     Review of Systems  Constitutional: Negative for fever and chills.  HENT:       Positive for hypersensitivity to sound  Eyes: Positive for photophobia.  Gastrointestinal: Positive for nausea and vomiting. Negative for abdominal pain.  Neurological: Positive for headaches.      Objective:    BP 118/80 mmHg  Pulse 95  Temp(Src) 98 F (36.7 C) (Oral)  Resp 16  Ht _0  (1.702 m)  Wt 323 lb (146.512  kg)  BMI 50.58 kg/m2  SpO2 98%  LMP 07/18/2011 Nursing note and vital signs reviewed.  Physical Exam  Constitutional: She is oriented to person, place, and time. She appears well-developed and well-nourished. No distress.  Eyes: Conjunctivae and EOM are normal. Pupils are equal, round, and reactive to light.  Cardiovascular: Normal rate, regular rhythm, normal heart sounds and intact distal pulses.   Pulmonary/Chest: Effort normal and breath sounds normal.  Neurological: She is alert and oriented to person, place, and time. No cranial nerve deficit. Coordination normal.  Skin: Skin is warm and dry.  Psychiatric: She has a normal mood and affect. Her behavior is normal. Judgment and thought content normal.       Assessment & Plan:   Problem List Items Addressed This Visit      Other   Mixed headache - Primary    Symptoms and exam remain consistent with mixed headaches although favoring migraine. Declines in office injection of Toradol and Depo-Medrol. Refill Imitrex and ondansetron. Start Topamax. Discussed a headache/food journal to determine potential causes. Denies worse headache of life with little concern for intracranial pathology. Follow-up neurology referral or for headache worsening.      Relevant  Medications   topiramate (TOPAMAX) 25 MG tablet   SUMAtriptan (IMITREX) 100 MG tablet   ondansetron (ZOFRAN) 4 MG tablet   Other Relevant Orders   Ambulatory referral to Neurology       I have changed Ms. Rizor's SUMAtriptan. I am also having her start on topiramate. Additionally, I am having her maintain her B-D UF III MINI PEN NEEDLES, levalbuterol, budesonide-formoterol, cyclobenzaprine, vitamin B-12, cholecalciferol, methotrexate, predniSONE, folic acid, ONE TOUCH ULTRA 2, glucose blood, HYDROmorphone, acetaminophen, sertraline, cetirizine, gabapentin, oxyCODONE-acetaminophen, ibuprofen, tiZANidine, Naproxen-Esomeprazole, naproxen, ALPRAZolam, diphenoxylate-atropine, nystatin, furosemide, potassium chloride, pantoprazole, azelastine, telmisartan, naproxen, and ondansetron.   Meds ordered this encounter  Medications  . topiramate (TOPAMAX) 25 MG tablet    Sig: Take 1 tablet by mouth daily for 1 week then increase to 1 tablet by mouth twice daily for 1 week then increase to 2 tablets by mouth in the evening and 1 tablet by mouth in the morning for 1 week then increase to 2 tablets by mouth daily.    Dispense:  60 tablet    Refill:  0    Order Specific Question:  Supervising Provider    Answer:  Pricilla Holm A [[id number]]  . SUMAtriptan (IMITREX) 100 MG tablet    Sig: Take 1 tablet by mouth at the onset of a headache. May repeat in 2 hours if headache persists or recurs.    Dispense:  10 tablet    Refill:  2    Order Specific Question:  Supervising Provider    Answer:  Pricilla Holm A [[id number]]  . ondansetron (ZOFRAN) 4 MG tablet    Sig: Take 1 tablet (4 mg total) by mouth 4 (four) times daily as needed for nausea or vomiting.    Dispense:  50 tablet    Refill:  0    Order Specific Question:  Supervising Provider    Answer:  Pricilla Holm A [[id number]]     Follow-up: Return if symptoms worsen or fail to improve.  Mauricio Po, FNP

## 2016-04-07 NOTE — Progress Notes (Signed)
Pre visit review using our clinic review tool, if applicable. No additional management support is needed unless otherwise documented below in the visit note. 

## 2016-04-07 NOTE — Patient Instructions (Signed)
Thank you for choosing [facility].  Summary/Instructions:  Your prescription(s) have been submitted to your pharmacy or been printed and provided for you. Please take as directed and contact our office if you believe you are having problem(s) with the medication(s) or have any questions.  Referrals have been made during this visit. You should expect to hear back from our schedulers in about 7-10 days in regards to establishing an appointment with the specialists we discussed.   If your symptoms worsen or fail to improve, please contact our office for further instruction, or in case of emergency go directly to the emergency room at the closest medical facility.   Migraine Headache A migraine headache is an intense, throbbing pain on one or both sides of your head. A migraine can last for 30 minutes to several hours. CAUSES  The exact cause of a migraine headache is not always known. However, a migraine may be caused when nerves in the brain become irritated and release chemicals that cause inflammation. This causes pain. Certain things may also trigger migraines, such as:  Alcohol.  Smoking.  Stress.  Menstruation.  Aged cheeses.  Foods or drinks that contain nitrates, glutamate, aspartame, or tyramine.  Lack of sleep.  Chocolate.  Caffeine.  Hunger.  Physical exertion.  Fatigue.  Medicines used to treat chest pain (nitroglycerine), birth control pills, estrogen, and some blood pressure medicines. SIGNS AND SYMPTOMS  Pain on one or both sides of your head.  Pulsating or throbbing pain.  Severe pain that prevents daily activities.  Pain that is aggravated by any physical activity.  Nausea, vomiting, or both.  Dizziness.  Pain with exposure to bright lights, loud noises, or activity.  General sensitivity to bright lights, loud noises, or smells. Before you get a migraine, you may get warning signs that a migraine is coming (aura). An aura may  include:  Seeing flashing lights.  Seeing bright spots, halos, or zigzag lines.  Having tunnel vision or blurred vision.  Having feelings of numbness or tingling.  Having trouble talking.  Having muscle weakness. DIAGNOSIS  A migraine headache is often diagnosed based on:  Symptoms.  Physical exam.  A CT scan or MRI of your head. These imaging tests cannot diagnose migraines, but they can help rule out other causes of headaches. TREATMENT Medicines may be given for pain and nausea. Medicines can also be given to help prevent recurrent migraines.  HOME CARE INSTRUCTIONS  Only take over-the-counter or prescription medicines for pain or discomfort as directed by your health care provider. The use of long-term narcotics is not recommended.  Lie down in a dark, quiet room when you have a migraine.  Keep a journal to find out what may trigger your migraine headaches. For example, write down:  What you eat and drink.  How much sleep you get.  Any change to your diet or medicines.  Limit alcohol consumption.  Quit smoking if you smoke.  Get 7-9 hours of sleep, or as recommended by your health care provider.  Limit stress.  Keep lights dim if bright lights bother you and make your migraines worse. SEEK IMMEDIATE MEDICAL CARE IF:   Your migraine becomes severe.  You have a fever.  You have a stiff neck.  You have vision loss.  You have muscular weakness or loss of muscle control.  You start losing your balance or have trouble walking.  You feel faint or pass out.  You have severe symptoms that are different from your first  symptoms. MAKE SURE YOU:   Understand these instructions.  Will watch your condition.  Will get help right away if you are not doing well or get worse.   This information is not intended to replace advice given to you by your health care provider. Make sure you discuss any questions you have with your health care provider.   Document  Released: 10/11/2005 Document Revised: 11/01/2014 Document Reviewed: 06/18/2013 Elsevier Interactive Patient Education [employer].

## 2016-04-11 NOTE — Assessment & Plan Note (Signed)
Pt not seen, opened in error

## 2016-04-11 NOTE — Progress Notes (Signed)
Opened in error

## 2016-04-22 ENCOUNTER — Ambulatory Visit: Payer: Self-pay | Admitting: Internal Medicine

## 2016-04-22 ENCOUNTER — Telehealth: Payer: Self-pay

## 2016-04-22 NOTE — Telephone Encounter (Signed)
Patient called in stating that headaches were back and nothing has helped so far. Made appointment to follow up on this

## 2016-04-28 ENCOUNTER — Ambulatory Visit: Payer: Self-pay | Admitting: Internal Medicine

## 2016-05-12 ENCOUNTER — Other Ambulatory Visit: Payer: Self-pay | Admitting: Internal Medicine

## 2016-05-13 NOTE — Telephone Encounter (Signed)
Medication refill sent to pharmacy  

## 2016-05-13 NOTE — Telephone Encounter (Signed)
Please advise 

## 2016-05-13 NOTE — Telephone Encounter (Signed)
Done hardcopy to Corinne  

## 2016-06-01 ENCOUNTER — Ambulatory Visit: Payer: Self-pay | Admitting: Emergency Medicine

## 2016-06-02 ENCOUNTER — Ambulatory Visit (HOSPITAL_COMMUNITY): Payer: BLUE CROSS/BLUE SHIELD | Attending: Cardiology

## 2016-06-02 ENCOUNTER — Other Ambulatory Visit: Payer: Self-pay

## 2016-06-02 DIAGNOSIS — I371 Nonrheumatic pulmonary valve insufficiency: Secondary | ICD-10-CM | POA: Diagnosis not present

## 2016-06-02 DIAGNOSIS — Z794 Long term (current) use of insulin: Secondary | ICD-10-CM | POA: Insufficient documentation

## 2016-06-02 DIAGNOSIS — F411 Generalized anxiety disorder: Secondary | ICD-10-CM | POA: Insufficient documentation

## 2016-06-02 DIAGNOSIS — I10 Essential (primary) hypertension: Secondary | ICD-10-CM | POA: Diagnosis not present

## 2016-06-02 DIAGNOSIS — I340 Nonrheumatic mitral (valve) insufficiency: Secondary | ICD-10-CM | POA: Insufficient documentation

## 2016-06-02 DIAGNOSIS — R609 Edema, unspecified: Secondary | ICD-10-CM | POA: Diagnosis not present

## 2016-06-02 DIAGNOSIS — I110 Hypertensive heart disease with heart failure: Secondary | ICD-10-CM | POA: Diagnosis not present

## 2016-06-02 DIAGNOSIS — I358 Other nonrheumatic aortic valve disorders: Secondary | ICD-10-CM | POA: Insufficient documentation

## 2016-06-02 DIAGNOSIS — E785 Hyperlipidemia, unspecified: Secondary | ICD-10-CM | POA: Diagnosis not present

## 2016-06-02 DIAGNOSIS — E1140 Type 2 diabetes mellitus with diabetic neuropathy, unspecified: Secondary | ICD-10-CM | POA: Insufficient documentation

## 2016-06-02 DIAGNOSIS — I5031 Acute diastolic (congestive) heart failure: Secondary | ICD-10-CM | POA: Diagnosis not present

## 2016-06-02 DIAGNOSIS — Z6841 Body Mass Index (BMI) 40.0 and over, adult: Secondary | ICD-10-CM | POA: Diagnosis not present

## 2016-06-02 DIAGNOSIS — I071 Rheumatic tricuspid insufficiency: Secondary | ICD-10-CM | POA: Diagnosis not present

## 2016-06-07 ENCOUNTER — Encounter: Payer: Self-pay | Admitting: Family

## 2016-06-09 ENCOUNTER — Ambulatory Visit: Payer: BLUE CROSS/BLUE SHIELD | Admitting: Internal Medicine

## 2016-06-15 ENCOUNTER — Other Ambulatory Visit: Payer: Self-pay | Admitting: Internal Medicine

## 2016-06-16 ENCOUNTER — Encounter: Payer: Self-pay | Admitting: Internal Medicine

## 2016-06-16 ENCOUNTER — Ambulatory Visit (INDEPENDENT_AMBULATORY_CARE_PROVIDER_SITE_OTHER): Payer: BLUE CROSS/BLUE SHIELD | Admitting: Internal Medicine

## 2016-06-16 ENCOUNTER — Other Ambulatory Visit (INDEPENDENT_AMBULATORY_CARE_PROVIDER_SITE_OTHER): Payer: BLUE CROSS/BLUE SHIELD

## 2016-06-16 VITALS — BP 132/82 | HR 106 | Temp 98.7°F | Resp 20 | Wt 326.0 lb

## 2016-06-16 DIAGNOSIS — Z794 Long term (current) use of insulin: Secondary | ICD-10-CM

## 2016-06-16 DIAGNOSIS — E1140 Type 2 diabetes mellitus with diabetic neuropathy, unspecified: Secondary | ICD-10-CM | POA: Diagnosis not present

## 2016-06-16 DIAGNOSIS — D509 Iron deficiency anemia, unspecified: Secondary | ICD-10-CM | POA: Diagnosis not present

## 2016-06-16 DIAGNOSIS — R6889 Other general symptoms and signs: Secondary | ICD-10-CM

## 2016-06-16 DIAGNOSIS — Z0001 Encounter for general adult medical examination with abnormal findings: Secondary | ICD-10-CM

## 2016-06-16 DIAGNOSIS — I10 Essential (primary) hypertension: Secondary | ICD-10-CM | POA: Diagnosis not present

## 2016-06-16 DIAGNOSIS — I872 Venous insufficiency (chronic) (peripheral): Secondary | ICD-10-CM

## 2016-06-16 LAB — CBC WITH DIFFERENTIAL/PLATELET
BASOS PCT: 0.4 % (ref 0.0–3.0)
Basophils Absolute: 0 10*3/uL (ref 0.0–0.1)
EOS PCT: 6.3 % — AB (ref 0.0–5.0)
Eosinophils Absolute: 0.4 10*3/uL (ref 0.0–0.7)
HEMATOCRIT: 35 % — AB (ref 36.0–46.0)
HEMOGLOBIN: 11.6 g/dL — AB (ref 12.0–15.0)
Lymphocytes Relative: 12.7 % (ref 12.0–46.0)
Lymphs Abs: 0.8 10*3/uL (ref 0.7–4.0)
MCHC: 33.3 g/dL (ref 30.0–36.0)
MCV: 84.7 fl (ref 78.0–100.0)
MONO ABS: 0.4 10*3/uL (ref 0.1–1.0)
MONOS PCT: 6.1 % (ref 3.0–12.0)
Neutro Abs: 4.7 10*3/uL (ref 1.4–7.7)
Neutrophils Relative %: 74.5 % (ref 43.0–77.0)
Platelets: 290 10*3/uL (ref 150.0–400.0)
RBC: 4.13 Mil/uL (ref 3.87–5.11)
RDW: 16.2 % — AB (ref 11.5–15.5)
WBC: 6.3 10*3/uL (ref 4.0–10.5)

## 2016-06-16 LAB — BASIC METABOLIC PANEL
BUN: 7 mg/dL (ref 6–23)
CALCIUM: 8.6 mg/dL (ref 8.4–10.5)
CO2: 25 mEq/L (ref 19–32)
Chloride: 103 mEq/L (ref 96–112)
Creatinine, Ser: 0.73 mg/dL (ref 0.40–1.20)
GFR: 112.2 mL/min (ref >60.00)
Glucose, Bld: 182 mg/dL — ABNORMAL HIGH (ref 70–99)
POTASSIUM: 3.3 meq/L — AB (ref 3.5–5.1)
SODIUM: 136 meq/L (ref 135–145)

## 2016-06-16 LAB — LIPID PANEL
CHOL/HDL RATIO: 4
Cholesterol: 176 mg/dL (ref 0–200)
HDL: 39.8 mg/dL (ref >39.00)
LDL CALC: 108 mg/dL — AB (ref 0–99)
NonHDL: 136.18
Triglycerides: 141 mg/dL (ref 0.0–149.0)
VLDL: 28.2 mg/dL (ref 0.0–40.0)

## 2016-06-16 LAB — IBC PANEL
IRON: 36 ug/dL — AB (ref 42–145)
Saturation Ratios: 10.2 % — ABNORMAL LOW (ref 20.0–50.0)
Transferrin: 252 mg/dL (ref 212.0–360.0)

## 2016-06-16 LAB — HEMOGLOBIN A1C: Hgb A1c MFr Bld: 6.3 % (ref 4.6–6.5)

## 2016-06-16 LAB — HEPATIC FUNCTION PANEL
ALT: 13 U/L (ref 0–35)
AST: 16 U/L (ref 0–37)
Albumin: 3.7 g/dL (ref 3.5–5.2)
Alkaline Phosphatase: 149 U/L — ABNORMAL HIGH (ref 39–117)
BILIRUBIN TOTAL: 0.3 mg/dL (ref 0.2–1.2)
Bilirubin, Direct: 0.1 mg/dL (ref 0.0–0.3)
Total Protein: 6.5 g/dL (ref 6.0–8.3)

## 2016-06-16 MED ORDER — FUROSEMIDE 40 MG PO TABS
ORAL_TABLET | ORAL | 11 refills | Status: DC
Start: 2016-06-16 — End: 2021-12-31

## 2016-06-16 NOTE — Patient Instructions (Signed)
Ok to increase the lasix to 40 mg in the AM, and then 40 mg in the PM only for persistent swelling  Please also continue the compression stocking use, low salt diet and weight loss, as well as leg elevation when sitting  Please continue all other medications as before, and refills have been done if requested.  Please have the pharmacy call with any other refills you may need.  Please continue your efforts at being more active, low cholesterol diet, and weight control.  Please keep your appointments with your specialists as you may have planned  Please go to the LAB in the Basement (turn left off the elevator) for the tests to be done today  You will be contacted by phone if any changes need to be made immediately.  Otherwise, you will receive a letter about your results with an explanation, but please check with MyChart first.  Please remember to sign up for MyChart if you have not done so, as this will be important to you in the future with finding out test results, communicating by private email, and scheduling acute appointments online when needed.  Please return in 6 months, or sooner if needed, with Lab testing done 3-5 days before

## 2016-06-16 NOTE — Progress Notes (Signed)
Subjective:    Patient ID: Nicole Melendez, female    DOB: [birth year], 40 y.o.   MRN: [id number]  HPI  Here to f/u; overall doing ok,  Pt denies chest pain, increasing sob or doe, wheezing, orthopnea, PND, increased LE swelling, palpitations, dizziness or syncope though still has leg swelling despite use of lasix 40 qd, wants to know if can incresae the dose.  Stands all day at work, hard to lose wt..  Pt denies new neurological symptoms such as new headache, or facial or extremity weakness or numbness.  Pt denies polydipsia, polyuria, or low sugar episode.   Pt denies new neurological symptoms such as new headache, or facial or extremity weakness or numbness.   Pt states overall good compliance with meds, mostly trying to follow appropriate diet, with wt overall stable,  but little exercise however. Past Medical History:  Diagnosis Date  . Allergic rhinitis, cause unspecified   . Anemia   . Anxiety   . Depression   . Diabetes mellitus type II    steroid related, patient states "im no longer diabetic"  . Essential hypertension 08/08/2015  . GERD (gastroesophageal reflux disease)   . History of blood transfusion   . Hyperlipidemia   . Menorrhagia   . Migraine   . Morbid obesity ([place])   . Otitis media 06/28/2015  . Peptic ulcer disease   . PNA (pneumonia) july 2011  . Positive ANA (antinuclear antibody) 02/14/2012  . Sarcoid ([place])    including hand per rheumatology-Dr. Amil Amen  . Sarcoidosis of lung ([place])   . Shortness of breath    on exertion  . Varicose veins with pain    Past Surgical History:  Procedure Laterality Date  . ABDOMINAL HYSTERECTOMY    . uterine ablation  03/2010  . WISDOM TOOTH EXTRACTION      reports that she quit smoking about 3 years ago. Her smoking use included Cigarettes. She has a 20.00 pack-year smoking history. She does not have any smokeless tobacco history on file. She reports that she does not drink alcohol or use drugs. family history includes Allergies  in her mother; Bone cancer in her maternal aunt; Breast cancer in her maternal aunt; Diabetes in her father and mother; Heart attack in her mother; Heart disease in her father; Lung cancer in her maternal aunt; Ovarian cancer in her maternal aunt; Rheum arthritis in her father. Allergies  Allergen Reactions  . Azithromycin Hives    (z pak) hives  . Penicillins Shortness Of Breath and Swelling    Has patient had a PCN reaction causing immediate rash, facial/tongue/throat swelling, SOB or lightheadedness with hypotension: Yes Has patient had a PCN reaction causing severe rash involving mucus membranes or skin necrosis: No Has patient had a PCN reaction that required hospitalization No Has patient had a PCN reaction occurring within the last 10 years: No If all of the above answers are "NO", then may proceed with Cephalosporin use.  REACTION: swelling and difficulty breathing  . Shellfish Allergy Anaphylaxis  . Klonopin [Clonazepam] Other (See Comments)    Memory difficulty  . Bee Venom   . Flagyl [Metronidazole]    Current Outpatient Prescriptions on File Prior to Visit  Medication Sig Dispense Refill  . acetaminophen (TYLENOL) 500 MG tablet Take 1,000 mg by mouth every 6 (six) hours as needed for mild pain or moderate pain.    Marland Kitchen ALPRAZolam (XANAX) 0.5 MG tablet TAKE 1 TABLET THREE TIMES DAILY AS NEEDED 90 tablet 2  .  azelastine (ASTELIN) 0.1 % nasal spray Place 2 sprays into both nostrils 2 (two) times daily. Use in each nostril as directed 30 mL 12  . B-D UF III MINI PEN NEEDLES 31G X 5 MM MISC USE FOUR TIMES DAILY FOR SLIDING SCALE AS NEEDED 200 each 0  . Blood Glucose Monitoring Suppl (ONE TOUCH ULTRA 2) W/DEVICE KIT Use as directed four times daily. Dx: E11.9 1 each 0  . budesonide-formoterol (SYMBICORT) 160-4.5 MCG/ACT inhaler Inhale 2 puffs into the lungs 2 (two) times daily. 1 Inhaler 11  . cetirizine (ZYRTEC) 10 MG tablet Take 1 tablet (10 mg total) by mouth daily. 90 tablet 3  .  cholecalciferol (VITAMIN D) 1000 UNITS tablet Take 5,000 Units by mouth daily.    . cyclobenzaprine (FLEXERIL) 5 MG tablet Take 1 tablet (5 mg total) by mouth at bedtime as needed for muscle spasms. 30 tablet 3  . diphenoxylate-atropine (LOMOTIL) 2.5-0.025 MG tablet Take 1 tablet by mouth 4 (four) times daily as needed for diarrhea or loose stools. 40 tablet 0  . folic acid (FOLVITE) 1 MG tablet Take 1 tablet (1 mg total) by mouth daily. 60 tablet 5  . gabapentin (NEURONTIN) 300 MG capsule Take 1 capsule (300 mg total) by mouth 3 (three) times daily. 90 capsule 5  . glucose blood (ONE TOUCH ULTRA TEST) test strip Use to check blood sugars four times a day Dx E11.9 400 each 11  . HYDROmorphone (DILAUDID) 4 MG tablet Take 4 mg by mouth every 4 (four) hours as needed for severe pain.    Marland Kitchen ibuprofen (ADVIL,MOTRIN) 600 MG tablet Take 1 tablet (600 mg total) by mouth every 6 (six) hours as needed. 30 tablet 0  . levalbuterol (XOPENEX HFA) 45 MCG/ACT inhaler Inhale 1-2 puffs into the lungs 2 (two) times daily. (Patient taking differently: Inhale 1-2 puffs into the lungs every 8 (eight) hours as needed for wheezing or shortness of breath. ) 15 g 11  . methotrexate (RHEUMATREX) 2.5 MG tablet Take 10 mg by mouth every Friday.     . naproxen (NAPROSYN) 500 MG tablet Take 1 tablet (500 mg total) by mouth 2 (two) times daily with a meal. 60 tablet 0  . naproxen (NAPROSYN) 500 MG tablet TAKE 1 TABLET BY MOUTH TWICE DAILY WITH A MEAL 60 tablet 0  . Naproxen-Esomeprazole (VIMOVO) 500-20 MG TBEC Take 1 tablet by mouth 2 (two) times daily as needed. 60 tablet 1  . nystatin (MYCOSTATIN) 100000 UNIT/ML suspension Take 5 mLs (500,000 Units total) by mouth 4 (four) times daily. 60 mL 1  . ondansetron (ZOFRAN) 4 MG tablet Take 1 tablet (4 mg total) by mouth 4 (four) times daily as needed for nausea or vomiting. 50 tablet 0  . oxyCODONE-acetaminophen (PERCOCET/ROXICET) 5-325 MG tablet Take 2 tablets by mouth every 4 (four)  hours as needed for severe pain. 20 tablet 0  . pantoprazole (PROTONIX) 40 MG tablet Take 1 tablet (40 mg total) by mouth daily. 90 tablet 3  . potassium chloride (K-DUR) 10 MEQ tablet 2 tab by mouth per day only when taking the lasix 60 tablet 11  . predniSONE (DELTASONE) 5 MG tablet Take 1 tablet (5 mg total) by mouth daily with breakfast. When current prednisone pulse of 85m dosing ends 60 tablet 6  . sertraline (ZOLOFT) 100 MG tablet TAKE 2 TABLETS BY MOUTH DAILY 180 tablet 1  . SUMAtriptan (IMITREX) 100 MG tablet Take 1 tablet by mouth at the onset of a headache. May repeat  in 2 hours if headache persists or recurs. 10 tablet 2  . telmisartan (MICARDIS) 40 MG tablet Take 1 tablet (40 mg total) by mouth daily. 90 tablet 3  . tiZANidine (ZANAFLEX) 4 MG tablet Take 1 tablet (4 mg total) by mouth every 6 (six) hours as needed for muscle spasms. 40 tablet 0  . topiramate (TOPAMAX) 25 MG tablet Take 1 tablet by mouth daily for 1 week then increase to 1 tablet by mouth twice daily for 1 week then increase to 2 tablets by mouth in the evening and 1 tablet by mouth in the morning for 1 week then increase to 2 tablets by mouth daily. 60 tablet 0  . vitamin B-12 (CYANOCOBALAMIN) 1000 MCG tablet Take 1,000 mcg by mouth daily.     No current facility-administered medications on file prior to visit.    Review of Systems  Constitutional: Negative for unusual diaphoresis or night sweats HENT: Negative for ear swelling or discharge Eyes: Negative for worsening visual haziness  Respiratory: Negative for choking and stridor.   Gastrointestinal: Negative for distension or worsening eructation Genitourinary: Negative for retention or change in urine volume.  Musculoskeletal: Negative for other MSK pain or swelling Skin: Negative for color change and worsening wound Neurological: Negative for tremors and numbness other than noted  Psychiatric/Behavioral: Negative for decreased concentration or agitation other  than above       Objective:   Physical Exam BP 132/82   Pulse (!) 106   Temp 98.7 F (37.1 C) (Oral)   Resp 20   Wt (!) 326 lb (147.9 kg)   LMP 07/18/2011   SpO2 96%   BMI 51.06 kg/m  VS noted,  Constitutional: Pt appears in no apparent distress HENT: Head: NCAT.  Right Ear: External ear normal.  Left Ear: External ear normal.  Eyes: . Pupils are equal, round, and reactive to light. Conjunctivae and EOM are normal Neck: Normal range of motion. Neck supple.  Cardiovascular: Normal rate and regular rhythm.   Pulmonary/Chest: Effort normal and breath sounds without rales or wheezing.  Abd:  Soft, NT, ND, + BS Neurological: Pt is alert. Not confused , motor grossly intact Skin: Skin is warm. No rash, trace to 1+ bilat LE edema Psychiatric: Pt behavior is normal. No agitation.   Most recent echo Jun 02 2016 Study Conclusions  - Left ventricle: The cavity size was normal. Systolic function was   normal. The estimated ejection fraction was in the range of 55%   to 60%. Wall motion was normal; there were no regional wall   motion abnormalities. Left ventricular diastolic function   parameters were normal. - Aortic valve: Trileaflet; mildly thickened, mildly calcified   leaflets. - Mitral valve: There was trivial regurgitation. - Tricuspid valve: There was mild regurgitation. - Pulmonic valve: There was mild regurgitation.  Lab Results  Component Value Date   WBC 14.8 (H) 11/21/2015   HGB 12.5 11/21/2015   HCT 37.5 11/21/2015   PLT 315 11/21/2015   GLUCOSE 123 (H) 02/04/2016   CHOL 207 (H) 02/04/2016   TRIG 103.0 02/04/2016   HDL 52.50 02/04/2016   LDLDIRECT 135.0 07/05/2011   LDLCALC 134 (H) 02/04/2016   ALT 51 (H) 02/04/2016   AST 47 (H) 02/04/2016   NA 137 02/04/2016   K 3.3 (L) 02/04/2016   CL 100 02/04/2016   CREATININE 0.76 02/04/2016   BUN 8 02/04/2016   CO2 29 02/04/2016   TSH 1.03 07/29/2015   INR 1.3 11/17/2012  HGBA1C 7.1 (H) 02/04/2016    MICROALBUR <0.7 07/29/2015       Assessment & Plan:

## 2016-06-16 NOTE — Assessment & Plan Note (Signed)
stable overall by history and exam, recent data reviewed with pt, and pt to continue medical treatment as before,  to f/u any worsening symptoms or concerns Lab Results  Component Value Date   HGBA1C 7.1 (H) 02/04/2016

## 2016-06-16 NOTE — Assessment & Plan Note (Signed)
Persistent LE edema not likely to be completely controlled with diuretic alone, for small increase lasix, wt loss, leg elevation, low salt diet, for lab eval today including repeat K and cr

## 2016-06-16 NOTE — Assessment & Plan Note (Signed)
stable overall by history and exam, recent data reviewed with pt, and pt to continue medical treatment as before,  to f/u any worsening symptoms or concerns BP Readings from Last 3 Encounters:  06/16/16 132/82  04/07/16 118/80  03/11/16 116/78

## 2016-06-16 NOTE — Progress Notes (Signed)
Pre visit review using our clinic review tool, if applicable. No additional management support is needed unless otherwise documented below in the visit note. 

## 2016-06-16 NOTE — Assessment & Plan Note (Signed)
No overt bleeding,  Lab Results  Component Value Date   WBC 14.8 (H) 11/21/2015   HGB 12.5 11/21/2015   HCT 37.5 11/21/2015   MCV 86.8 11/21/2015   PLT 315 11/21/2015   For f/u lab

## 2016-06-17 ENCOUNTER — Other Ambulatory Visit: Payer: Self-pay | Admitting: Internal Medicine

## 2016-06-17 DIAGNOSIS — D509 Iron deficiency anemia, unspecified: Secondary | ICD-10-CM

## 2016-06-17 MED ORDER — POTASSIUM CHLORIDE ER 10 MEQ PO TBCR
EXTENDED_RELEASE_TABLET | ORAL | 11 refills | Status: DC
Start: 2016-06-17 — End: 2018-07-18

## 2016-06-18 ENCOUNTER — Ambulatory Visit (INDEPENDENT_AMBULATORY_CARE_PROVIDER_SITE_OTHER): Payer: BLUE CROSS/BLUE SHIELD | Admitting: Internal Medicine

## 2016-06-18 ENCOUNTER — Encounter: Payer: Self-pay | Admitting: Internal Medicine

## 2016-06-18 VITALS — BP 134/78 | HR 101 | Temp 98.5°F | Resp 20 | Wt 325.0 lb

## 2016-06-18 DIAGNOSIS — J069 Acute upper respiratory infection, unspecified: Secondary | ICD-10-CM | POA: Diagnosis not present

## 2016-06-18 DIAGNOSIS — E1140 Type 2 diabetes mellitus with diabetic neuropathy, unspecified: Secondary | ICD-10-CM

## 2016-06-18 DIAGNOSIS — R062 Wheezing: Secondary | ICD-10-CM

## 2016-06-18 DIAGNOSIS — I10 Essential (primary) hypertension: Secondary | ICD-10-CM | POA: Diagnosis not present

## 2016-06-18 DIAGNOSIS — Z794 Long term (current) use of insulin: Secondary | ICD-10-CM

## 2016-06-18 MED ORDER — HYDROCODONE-HOMATROPINE 5-1.5 MG/5ML PO SYRP: 5.0000 mL | ORAL_SOLUTION | Freq: Four times a day (QID) | ORAL | 0 refills | Status: AC | PRN

## 2016-06-18 MED ORDER — PREDNISONE 10 MG PO TABS: ORAL_TABLET | ORAL | 0 refills | Status: DC

## 2016-06-18 MED ORDER — LEVOFLOXACIN 500 MG PO TABS: 500.0000 mg | ORAL_TABLET | Freq: Every day | ORAL | 0 refills | Status: AC

## 2016-06-18 NOTE — Assessment & Plan Note (Signed)
Mild to mod, for antibx course,  to f/u any worsening symptoms or concerns 

## 2016-06-18 NOTE — Progress Notes (Signed)
Pre visit review using our clinic review tool, if applicable. No additional management support is needed unless otherwise documented below in the visit note. 

## 2016-06-18 NOTE — Assessment & Plan Note (Signed)
stable overall by history and exam, recent data reviewed with pt, and pt to continue medical treatment as before,  to f/u any worsening symptoms or concerns Lab Results  Component Value Date   HGBA1C 6.3 06/16/2016   Pt to call for onset polys or cbg > 200 with steroid tx

## 2016-06-18 NOTE — Assessment & Plan Note (Signed)
/  Mild to mod, for predpac asd,  to f/u any worsening symptoms or concerns 

## 2016-06-18 NOTE — Patient Instructions (Signed)
You are given the work note today  Please take all new medication as prescribed- the antibiotic, prednisone, and cough medicine  Please continue all other medications as before, and refills have been done if requested.  Please have the pharmacy call with any other refills you may need.  Please keep your appointments with your specialists as you may have planned

## 2016-06-18 NOTE — Assessment & Plan Note (Signed)
stable overall by history and exam, recent data reviewed with pt, and pt to continue medical treatment as before,  to f/u any worsening symptoms or concerns BP Readings from Last 3 Encounters:  06/18/16 134/78  06/16/16 132/82  04/07/16 118/80

## 2016-06-18 NOTE — Progress Notes (Signed)
Subjective:    Patient ID: Nicole Melendez, female    DOB: December 02, 1975, 40 y.o.   MRN: [id number]  HPI   Here with 2-3 days acute onset fever, facial pain, pressure, headache, general weakness and malaise, and greenish d/c, with mild ST and cough, but pt denies chest pain, wheezing, increased sob or doe, orthopnea, PND, increased LE swelling, palpitations, dizziness or syncope, except for onset mild wheeze/sob since last PM.  Pt denies new neurological symptoms such as new headache, or facial or extremity weakness or numbness   Pt denies polydipsia, polyuria.  Past Medical History:  Diagnosis Date  . Allergic rhinitis, cause unspecified   . Anemia   . Anxiety   . Depression   . Diabetes mellitus type II    steroid related, patient states "im no longer diabetic"  . Essential hypertension 08/08/2015  . GERD (gastroesophageal reflux disease)   . History of blood transfusion   . Hyperlipidemia   . Menorrhagia   . Migraine   . Morbid obesity ([place])   . Otitis media 06/28/2015  . Peptic ulcer disease   . PNA (pneumonia) july 2011  . Positive ANA (antinuclear antibody) 02/14/2012  . Sarcoid ([place])    including hand per rheumatology-Dr. Amil Amen  . Sarcoidosis of lung ([place])   . Shortness of breath    on exertion  . Varicose veins with pain    Past Surgical History:  Procedure Laterality Date  . ABDOMINAL HYSTERECTOMY    . uterine ablation  03/2010  . WISDOM TOOTH EXTRACTION      reports that she quit smoking about 3 years ago. Her smoking use included Cigarettes. She has a 20.00 pack-year smoking history. She does not have any smokeless tobacco history on file. She reports that she does not drink alcohol or use drugs. family history includes Allergies in her mother; Bone cancer in her maternal aunt; Breast cancer in her maternal aunt; Diabetes in her father and mother; Heart attack in her mother; Heart disease in her father; Lung cancer in her maternal aunt; Ovarian cancer in her maternal  aunt; Rheum arthritis in her father. Allergies  Allergen Reactions  . Azithromycin Hives    (z pak) hives  . Penicillins Shortness Of Breath and Swelling    Has patient had a PCN reaction causing immediate rash, facial/tongue/throat swelling, SOB or lightheadedness with hypotension: Yes Has patient had a PCN reaction causing severe rash involving mucus membranes or skin necrosis: No Has patient had a PCN reaction that required hospitalization No Has patient had a PCN reaction occurring within the last 10 years: No If all of the above answers are "NO", then may proceed with Cephalosporin use.  REACTION: swelling and difficulty breathing  . Shellfish Allergy Anaphylaxis  . Klonopin [Clonazepam] Other (See Comments)    Memory difficulty  . Bee Venom   . Flagyl [Metronidazole]    Current Outpatient Prescriptions on File Prior to Visit  Medication Sig Dispense Refill  . acetaminophen (TYLENOL) 500 MG tablet Take 1,000 mg by mouth every 6 (six) hours as needed for mild pain or moderate pain.    Marland Kitchen ALPRAZolam (XANAX) 0.5 MG tablet TAKE 1 TABLET THREE TIMES DAILY AS NEEDED 90 tablet 2  . azelastine (ASTELIN) 0.1 % nasal spray Place 2 sprays into both nostrils 2 (two) times daily. Use in each nostril as directed 30 mL 12  . B-D UF III MINI PEN NEEDLES 31G X 5 MM MISC USE FOUR TIMES DAILY FOR SLIDING SCALE AS  NEEDED 200 each 0  . Blood Glucose Monitoring Suppl (ONE TOUCH ULTRA 2) W/DEVICE KIT Use as directed four times daily. Dx: E11.9 1 each 0  . budesonide-formoterol (SYMBICORT) 160-4.5 MCG/ACT inhaler Inhale 2 puffs into the lungs 2 (two) times daily. 1 Inhaler 11  . cetirizine (ZYRTEC) 10 MG tablet TAKE 1 TABLET(10 MG) BY MOUTH DAILY 90 tablet 0  . cholecalciferol (VITAMIN D) 1000 UNITS tablet Take 5,000 Units by mouth daily.    . cyclobenzaprine (FLEXERIL) 5 MG tablet Take 1 tablet (5 mg total) by mouth at bedtime as needed for muscle spasms. 30 tablet 3  . diphenoxylate-atropine (LOMOTIL)  2.5-0.025 MG tablet Take 1 tablet by mouth 4 (four) times daily as needed for diarrhea or loose stools. 40 tablet 0  . folic acid (FOLVITE) 1 MG tablet Take 1 tablet (1 mg total) by mouth daily. 60 tablet 5  . furosemide (LASIX) 40 MG tablet 1 tab by mouth every day in the AM and then 1 as needed only for persistent swelling in the PM 60 tablet 11  . gabapentin (NEURONTIN) 300 MG capsule Take 1 capsule (300 mg total) by mouth 3 (three) times daily. 90 capsule 5  . glucose blood (ONE TOUCH ULTRA TEST) test strip Use to check blood sugars four times a day Dx E11.9 400 each 11  . HYDROmorphone (DILAUDID) 4 MG tablet Take 4 mg by mouth every 4 (four) hours as needed for severe pain.    Marland Kitchen ibuprofen (ADVIL,MOTRIN) 600 MG tablet Take 1 tablet (600 mg total) by mouth every 6 (six) hours as needed. 30 tablet 0  . levalbuterol (XOPENEX HFA) 45 MCG/ACT inhaler Inhale 1-2 puffs into the lungs 2 (two) times daily. (Patient taking differently: Inhale 1-2 puffs into the lungs every 8 (eight) hours as needed for wheezing or shortness of breath. ) 15 g 11  . methotrexate (RHEUMATREX) 2.5 MG tablet Take 10 mg by mouth every Friday.     . naproxen (NAPROSYN) 500 MG tablet Take 1 tablet (500 mg total) by mouth 2 (two) times daily with a meal. 60 tablet 0  . naproxen (NAPROSYN) 500 MG tablet TAKE 1 TABLET BY MOUTH TWICE DAILY WITH A MEAL 60 tablet 0  . Naproxen-Esomeprazole (VIMOVO) 500-20 MG TBEC Take 1 tablet by mouth 2 (two) times daily as needed. 60 tablet 1  . nystatin (MYCOSTATIN) 100000 UNIT/ML suspension Take 5 mLs (500,000 Units total) by mouth 4 (four) times daily. 60 mL 1  . ondansetron (ZOFRAN) 4 MG tablet Take 1 tablet (4 mg total) by mouth 4 (four) times daily as needed for nausea or vomiting. 50 tablet 0  . oxyCODONE-acetaminophen (PERCOCET/ROXICET) 5-325 MG tablet Take 2 tablets by mouth every 4 (four) hours as needed for severe pain. 20 tablet 0  . pantoprazole (PROTONIX) 40 MG tablet Take 1 tablet (40 mg  total) by mouth daily. 90 tablet 3  . potassium chloride (K-DUR) 10 MEQ tablet 3 tab by mouth per day only when taking the lasix 90 tablet 11  . predniSONE (DELTASONE) 5 MG tablet Take 1 tablet (5 mg total) by mouth daily with breakfast. When current prednisone pulse of 21m dosing ends 60 tablet 6  . sertraline (ZOLOFT) 100 MG tablet TAKE 2 TABLETS BY MOUTH DAILY 180 tablet 1  . SUMAtriptan (IMITREX) 100 MG tablet Take 1 tablet by mouth at the onset of a headache. May repeat in 2 hours if headache persists or recurs. 10 tablet 2  . telmisartan (MICARDIS) 40 MG  tablet Take 1 tablet (40 mg total) by mouth daily. 90 tablet 3  . tiZANidine (ZANAFLEX) 4 MG tablet Take 1 tablet (4 mg total) by mouth every 6 (six) hours as needed for muscle spasms. 40 tablet 0  . topiramate (TOPAMAX) 25 MG tablet Take 1 tablet by mouth daily for 1 week then increase to 1 tablet by mouth twice daily for 1 week then increase to 2 tablets by mouth in the evening and 1 tablet by mouth in the morning for 1 week then increase to 2 tablets by mouth daily. 60 tablet 0  . vitamin B-12 (CYANOCOBALAMIN) 1000 MCG tablet Take 1,000 mcg by mouth daily.     No current facility-administered medications on file prior to visit.    Review of Systems  Constitutional: Negative for unusual diaphoresis or night sweats HENT: Negative for ear swelling or discharge Eyes: Negative for worsening visual haziness  Respiratory: Negative for choking and stridor.   Gastrointestinal: Negative for distension or worsening eructation Genitourinary: Negative for retention or change in urine volume.  Musculoskeletal: Negative for other MSK pain or swelling Skin: Negative for color change and worsening wound Neurological: Negative for tremors and numbness other than noted  Psychiatric/Behavioral: Negative for decreased concentration or agitation other than above       Objective:   Physical Exam BP 134/78   Pulse (!) 101   Temp 98.5 F (36.9 C)  (Oral)   Resp 20   Wt (!) 325 lb (147.4 kg)   LMP 07/18/2011   SpO2 95%   BMI 50.90 kg/m  VS noted, morbid obese, mild ill Constitutional: Pt appears in no apparent distress HENT: Head: NCAT.  Right Ear: External ear normal.  Left Ear: External ear normal.  Bilat tm's with mild erythema.  Max sinus areas mild tender.  Pharynx with mild erythema, no exudate Eyes: . Pupils are equal, round, and reactive to light. Conjunctivae and EOM are normal Neck: Normal range of motion. Neck supple.  Cardiovascular: Normal rate and regular rhythm.   Pulmonary/Chest: Effort normal and breath sounds without rales but with mild scattered wheezing.  Neurological: Pt is alert. Not confused , motor grossly intact Skin: Skin is warm. No rash, no LE edema Psychiatric: Pt behavior is normal. No agitation.      Assessment & Plan:

## 2016-06-18 NOTE — Progress Notes (Signed)
Letter done

## 2016-06-23 DIAGNOSIS — Z6841 Body Mass Index (BMI) 40.0 and over, adult: Secondary | ICD-10-CM | POA: Diagnosis not present

## 2016-06-23 DIAGNOSIS — Z1231 Encounter for screening mammogram for malignant neoplasm of breast: Secondary | ICD-10-CM | POA: Diagnosis not present

## 2016-06-23 DIAGNOSIS — Z01419 Encounter for gynecological examination (general) (routine) without abnormal findings: Secondary | ICD-10-CM | POA: Diagnosis not present

## 2016-06-24 ENCOUNTER — Other Ambulatory Visit: Payer: Self-pay | Admitting: Internal Medicine

## 2016-06-30 ENCOUNTER — Ambulatory Visit (INDEPENDENT_AMBULATORY_CARE_PROVIDER_SITE_OTHER): Payer: BLUE CROSS/BLUE SHIELD | Admitting: Emergency Medicine

## 2016-06-30 ENCOUNTER — Encounter: Payer: Self-pay | Admitting: Emergency Medicine

## 2016-06-30 ENCOUNTER — Telehealth: Payer: Self-pay | Admitting: Emergency Medicine

## 2016-06-30 ENCOUNTER — Other Ambulatory Visit: Payer: Self-pay | Admitting: Emergency Medicine

## 2016-06-30 DIAGNOSIS — D869 Sarcoidosis, unspecified: Secondary | ICD-10-CM | POA: Diagnosis not present

## 2016-06-30 DIAGNOSIS — J309 Allergic rhinitis, unspecified: Secondary | ICD-10-CM

## 2016-06-30 DIAGNOSIS — M533 Sacrococcygeal disorders, not elsewhere classified: Secondary | ICD-10-CM | POA: Diagnosis not present

## 2016-06-30 DIAGNOSIS — M5136 Other intervertebral disc degeneration, lumbar region: Secondary | ICD-10-CM | POA: Diagnosis not present

## 2016-06-30 DIAGNOSIS — M25571 Pain in right ankle and joints of right foot: Secondary | ICD-10-CM | POA: Diagnosis not present

## 2016-06-30 DIAGNOSIS — M545 Low back pain: Secondary | ICD-10-CM | POA: Diagnosis not present

## 2016-06-30 DIAGNOSIS — D860 Sarcoidosis of lung: Secondary | ICD-10-CM | POA: Diagnosis not present

## 2016-06-30 MED ORDER — FLUCONAZOLE 100 MG PO TABS: ORAL_TABLET | ORAL | 0 refills | Status: DC

## 2016-06-30 MED ORDER — LEVALBUTEROL TARTRATE 45 MCG/ACT IN AERO: 1.0000 | INHALATION_SPRAY | Freq: Three times a day (TID) | RESPIRATORY_TRACT | 5 refills | Status: DC | PRN

## 2016-06-30 MED ORDER — HYDROCODONE-HOMATROPINE 5-1.5 MG/5ML PO SYRP: 5.0000 mL | ORAL_SOLUTION | Freq: Four times a day (QID) | ORAL | 0 refills | Status: DC | PRN

## 2016-06-30 MED ORDER — DOXYCYCLINE HYCLATE 100 MG PO TABS: 100.0000 mg | ORAL_TABLET | Freq: Two times a day (BID) | ORAL | 0 refills | Status: DC

## 2016-06-30 MED ORDER — BUDESONIDE-FORMOTEROL FUMARATE 160-4.5 MCG/ACT IN AERO: 2.0000 | INHALATION_SPRAY | Freq: Two times a day (BID) | RESPIRATORY_TRACT | 5 refills | Status: DC

## 2016-06-30 NOTE — Patient Instructions (Addendum)
Please take doxycycline 100mg  twice a day for 7 days.  Take fluconazole 200mg  on first day, 100mg  daily for 3 days after Continue your Symbicort, we will refill  Take hycodan up to every 6 hours if needed for cough Xopenex as needed, refill today Continue your nasal saline, zyrtec, flonase nasal spray. Continue methotrexate as directed by Dr Amil Amen Follow with Dr Lamonte Sakai in 6 months or sooner if you have any problems

## 2016-06-30 NOTE — Telephone Encounter (Signed)
Attempted to contact patient, left message for patient to return call.

## 2016-06-30 NOTE — Progress Notes (Signed)
Subjective:    Patient ID: Nicole Melendez, female    DOB: May 03, 1976, 40 y.o.   MRN: [id number]  HPI 40 year old woman with a history of sarcoidosis and former tobacco use (20 pack years), diabetes, GERD, hypertension. Followed in the past by Dr Gwenette Greet and had been managed on Symbicort (stopped last time), methotrexate, and daily prednisone. Current prednisone dose is 5mg . She has been seen at the end of October and also on 09/02/15 by Dr. Jenny Reichmann for progressive dyspnea that was treated with corticosteroids and Levaquin, completed 3 weeks ago.  She underwent pulmonary function testing today that I personally reviewed. This shows grossly normal airflows without a bronchodilator response, now restriction based on a slightly decreased TLC, and a decreased diffusion capacity that corrects to normal range for alveolar volume.   Having nasal drainage, some green. Cough is a bit better than last month, but now producing colored mucous. She is on zyrtec, has stopped the flonase nasal spray. She is doing nasal saline spray. She gets regular eye exams.   ROV 11/26/15 -- patient follows up for her history of sarcoidosis and former tobacco use. She has been managed on methotrexate and daily prednisone. Her current dose is. She is having some nasal drainage and congestion, still on flonase and zyrtec.  She is on symbicort bid, rarely needs xopenex prn.  On stable dose MTX 10mg  per Dr Amil Amen. She was treated recently with a burst of prednisone for Low back neuropathic pain, now back to 5mg  daily.   ROV 06/30/16 -- Patient follows up today for history of sarcoidosis for which she is managed on methotrexate 6mg  only, off maintenance pred. She is on Symbicort twice a day. She also takes nasal washes, Fluticasone and Zyrtec for allergic rhinitis. She has had URI sx since about 10-12 days ago. She was treated with prednisone taper and cipro about a weeks ago. She needs refill symbicort, xopenex. She still has nasal  congestion, some dyspnea, cough with yellow / green. She still feels run down. She feels like she may have thrush  Review of Systems  As per HPI     Objective:   Physical Exam  Vitals:   06/30/16 0906 06/30/16 0907  BP:  124/82  Pulse:  75  SpO2:  100%  Weight: (!) 325 lb (147.4 kg)   Height: 5\' 7"  (1.702 m)    Gen: Pleasant, overweight, in no distress,  normal affect  ENT: No lesions,  mouth clear,  oropharynx clear, no postnasal drip  Neck: No JVD, no stridor  Lungs: No use of accessory muscles, clear without rales or rhonchi, no wheeze on forced expiration  Cardiovascular: RRR, heart sounds normal, no murmur or gallops, no peripheral edema  Musculoskeletal: No deformities, no cyanosis or clubbing  Neuro: alert, non focal  Skin: Warm, no lesions or rashes    10/06/15 --  COMPARISON: 05/13/2010.  FINDINGS: Mediastinum/Lymph Nodes: Mediastinal lymph nodes are not enlarged by CT size criteria. Hilar regions are difficult to definitively evaluate without IV contrast. No axillary adenopathy. Heart size normal. No pericardial effusion.  Lungs/Pleura: Image quality is somewhat degraded by body habitus. Mild patchy upper and midlung zone predominant ground-glass with slight architectural distortion. A corresponding acute infectious/ inflammatory process was seen in the upper/ mid lung zones on 05/13/2010. No pleural fluid. Airway is unremarkable.  Upper abdomen: Visualized portions of the liver, left adrenal gland, left kidney, spleen and pancreas are grossly unremarkable. Postoperative changes in the stomach.  Musculoskeletal: No worrisome lytic  or sclerotic lesions.  IMPRESSION: 1. Upper/midlung zone predominant ground-glass and mild architectural distortion may represent the residua of acute changes of sarcoid seen on 05/13/2010. Post infectious scarring can also have this appearance. 2. Mediastinal lymph nodes are within normal limits.        Assessment & Plan:  Acute bronchitis In theAftermath of  Upper respiratory infection. Appears to be developing recurrent bronchitic symptoms. Treat with doxycycline  Thrush Notable on tongue and right side of her oropharynx. Treat with fluconazole  Allergic rhinitis Continue her current maintenance nasal saline, fluticasone, Zyrtec. Add Hycodan cough syrup when necessary  PULMONARY SARCOIDOSIS Currently managed on methotrexate 6 mg weekly. She is off maintenance prednisone. No indication for repeat imaging at this time. We will discuss the timing of her repeat CT scan at her follow-up visit. Continue Symbicort as ordered Xopenex when necessary  Baltazar Apo, MD, PhD 06/30/2016, 9:39 AM [facility] [phone] or if no answer [phone]

## 2016-06-30 NOTE — Assessment & Plan Note (Signed)
Notable on tongue and right side of her oropharynx. Treat with fluconazole

## 2016-06-30 NOTE — Assessment & Plan Note (Signed)
Currently managed on methotrexate 6 mg weekly. She is off maintenance prednisone. No indication for repeat imaging at this time. We will discuss the timing of her repeat CT scan at her follow-up visit. Continue Symbicort as ordered Xopenex when necessary

## 2016-06-30 NOTE — Assessment & Plan Note (Signed)
In theAftermath of  Upper respiratory infection. Appears to be developing recurrent bronchitic symptoms. Treat with doxycycline

## 2016-06-30 NOTE — Assessment & Plan Note (Signed)
Continue her current maintenance nasal saline, fluticasone, Zyrtec. Add Hycodan cough syrup when necessary

## 2016-07-01 DIAGNOSIS — M533 Sacrococcygeal disorders, not elsewhere classified: Secondary | ICD-10-CM | POA: Diagnosis not present

## 2016-07-02 ENCOUNTER — Other Ambulatory Visit: Payer: Self-pay | Admitting: Orthopedic Surgery

## 2016-07-02 DIAGNOSIS — M25571 Pain in right ankle and joints of right foot: Secondary | ICD-10-CM

## 2016-07-04 ENCOUNTER — Other Ambulatory Visit: Payer: Self-pay | Admitting: Critical Care Medicine

## 2016-07-05 ENCOUNTER — Telehealth: Payer: Self-pay | Admitting: Emergency Medicine

## 2016-07-05 NOTE — Telephone Encounter (Signed)
LMTCB x 1 

## 2016-07-06 ENCOUNTER — Encounter: Payer: Self-pay | Admitting: *Deleted

## 2016-07-06 ENCOUNTER — Ambulatory Visit
Admission: RE | Admit: 2016-07-06 | Discharge: 2016-07-06 | Disposition: A | Discharge status: Home / Self Care | Payer: BLUE CROSS/BLUE SHIELD | Source: Ambulatory Visit | Attending: Orthopedic Surgery | Admitting: Orthopedic Surgery

## 2016-07-06 DIAGNOSIS — M25571 Pain in right ankle and joints of right foot: Secondary | ICD-10-CM

## 2016-07-06 NOTE — Telephone Encounter (Signed)
Spoke with pt. She is needing a work note for the days that she missed due to having bronchitis. She missed 06/21/16 - 06/30/16. Pt forgot to get this when she came in for her appointment with RB.  SN - please advise as RB is unavailable. Thanks.

## 2016-07-06 NOTE — Telephone Encounter (Signed)
Per SN >> okay to write letter.  Spoke with pt. She is aware that we will get this letter ready for her. She was very Patent attorney. Nothing further was needed.

## 2016-07-07 ENCOUNTER — Telehealth: Payer: Self-pay

## 2016-07-07 NOTE — Telephone Encounter (Signed)
Pt request LOA for 06/17/2016 - 06/19/2016 (see letters).  Form completed and signed by MD. Copy sent to scan, original placed in cabinet for pt pick up. Pt advised of same

## 2016-07-08 ENCOUNTER — Telehealth: Payer: Self-pay | Admitting: Emergency Medicine

## 2016-07-08 NOTE — Telephone Encounter (Signed)
PA initiated through [facility] for Levalbuterol inhaler Key: [id number]  Your information has been submitted to [employer] [place]. [facility] will review the request and fax you a determination directly, typically within 3 business days of your submission once all necessary information is received. If [facility] has not responded in 3 business days or if you have any questions about your submission, contact [place] at [phone].

## 2016-07-09 DIAGNOSIS — M25571 Pain in right ankle and joints of right foot: Secondary | ICD-10-CM | POA: Diagnosis not present

## 2016-07-09 NOTE — Telephone Encounter (Signed)
RB pt-medication not on med list. Please advise.

## 2016-07-09 NOTE — Telephone Encounter (Signed)
Pt has [place], not [place] PA redone thru [facility] and approved:  Approved today [id number] Name:FCR: Non-Preferred and Formulary Exclusion Copay Tier Exception Generic, Branded *[employer]*;Status:Approved;Coverage Start Date:06/18/2016;Coverage End Date:07/09/2017;  Called [facility], spoke with pharmacist Verdis Frederickson who is aware the Rx has gone through. Nothing further needed; will sign off

## 2016-07-09 NOTE — Telephone Encounter (Signed)
Nicole Melendez from [place] called she is unable to process PA for Xopenex because patient has [place] [employer] [place]. She can be contacted at [phone] if needed -pr

## 2016-07-12 ENCOUNTER — Other Ambulatory Visit: Payer: Self-pay | Admitting: Internal Medicine

## 2016-07-13 DIAGNOSIS — M25571 Pain in right ankle and joints of right foot: Secondary | ICD-10-CM | POA: Diagnosis not present

## 2016-07-13 DIAGNOSIS — S8264XD Nondisplaced fracture of lateral malleolus of right fibula, subsequent encounter for closed fracture with routine healing: Secondary | ICD-10-CM | POA: Diagnosis not present

## 2016-07-15 DIAGNOSIS — S8264XD Nondisplaced fracture of lateral malleolus of right fibula, subsequent encounter for closed fracture with routine healing: Secondary | ICD-10-CM | POA: Diagnosis not present

## 2016-07-15 DIAGNOSIS — M25571 Pain in right ankle and joints of right foot: Secondary | ICD-10-CM | POA: Diagnosis not present

## 2016-07-18 ENCOUNTER — Other Ambulatory Visit: Payer: Self-pay | Admitting: Internal Medicine

## 2016-07-19 ENCOUNTER — Telehealth: Payer: Self-pay

## 2016-07-19 DIAGNOSIS — S161XXD Strain of muscle, fascia and tendon at neck level, subsequent encounter: Secondary | ICD-10-CM

## 2016-07-19 MED ORDER — NAPROXEN-ESOMEPRAZOLE 500-20 MG PO TBEC: 1.0000 | DELAYED_RELEASE_TABLET | Freq: Two times a day (BID) | ORAL | 1 refills | Status: DC | PRN

## 2016-07-19 NOTE — Telephone Encounter (Signed)
A user error has taken place.

## 2016-07-19 NOTE — Telephone Encounter (Signed)
Medication refill sent to pharmacy  

## 2016-07-20 DIAGNOSIS — S8264XD Nondisplaced fracture of lateral malleolus of right fibula, subsequent encounter for closed fracture with routine healing: Secondary | ICD-10-CM | POA: Diagnosis not present

## 2016-07-20 DIAGNOSIS — M25571 Pain in right ankle and joints of right foot: Secondary | ICD-10-CM | POA: Diagnosis not present

## 2016-07-21 ENCOUNTER — Emergency Department (HOSPITAL_COMMUNITY)
Admission: EM | Admit: 2016-07-21 | Discharge: 2016-07-21 | Disposition: A | Discharge status: Home / Self Care | Payer: BLUE CROSS/BLUE SHIELD | Attending: Emergency Medicine | Admitting: Emergency Medicine

## 2016-07-21 ENCOUNTER — Emergency Department (HOSPITAL_COMMUNITY): Payer: BLUE CROSS/BLUE SHIELD

## 2016-07-21 ENCOUNTER — Encounter (HOSPITAL_COMMUNITY): Payer: Self-pay | Admitting: Emergency Medicine

## 2016-07-21 DIAGNOSIS — Y929 Unspecified place or not applicable: Secondary | ICD-10-CM | POA: Diagnosis not present

## 2016-07-21 DIAGNOSIS — Z87891 Personal history of nicotine dependence: Secondary | ICD-10-CM | POA: Diagnosis not present

## 2016-07-21 DIAGNOSIS — Z79899 Other long term (current) drug therapy: Secondary | ICD-10-CM | POA: Diagnosis not present

## 2016-07-21 DIAGNOSIS — Z791 Long term (current) use of non-steroidal anti-inflammatories (NSAID): Secondary | ICD-10-CM | POA: Diagnosis not present

## 2016-07-21 DIAGNOSIS — S99911A Unspecified injury of right ankle, initial encounter: Secondary | ICD-10-CM | POA: Diagnosis not present

## 2016-07-21 DIAGNOSIS — Y939 Activity, unspecified: Secondary | ICD-10-CM | POA: Insufficient documentation

## 2016-07-21 DIAGNOSIS — Y999 Unspecified external cause status: Secondary | ICD-10-CM | POA: Diagnosis not present

## 2016-07-21 DIAGNOSIS — Z794 Long term (current) use of insulin: Secondary | ICD-10-CM | POA: Diagnosis not present

## 2016-07-21 DIAGNOSIS — E119 Type 2 diabetes mellitus without complications: Secondary | ICD-10-CM | POA: Insufficient documentation

## 2016-07-21 DIAGNOSIS — W010XXA Fall on same level from slipping, tripping and stumbling without subsequent striking against object, initial encounter: Secondary | ICD-10-CM | POA: Diagnosis not present

## 2016-07-21 DIAGNOSIS — I10 Essential (primary) hypertension: Secondary | ICD-10-CM | POA: Insufficient documentation

## 2016-07-21 DIAGNOSIS — M25571 Pain in right ankle and joints of right foot: Secondary | ICD-10-CM | POA: Insufficient documentation

## 2016-07-21 MED ORDER — HYDROMORPHONE HCL 1 MG/ML IJ SOLN
1.0000 mg | Freq: Once | INTRAMUSCULAR | Status: AC
  Administered 2016-07-21: 1 mg via INTRAMUSCULAR
  Filled 2016-07-21: qty 1

## 2016-07-21 MED ORDER — HYDROMORPHONE HCL 2 MG/ML IJ SOLN: 2.0000 mg | Freq: Once | INTRAMUSCULAR | Status: DC

## 2016-07-21 NOTE — ED Provider Notes (Signed)
[name] DEPT Provider Note   CSN: [id number] Arrival date & time: 07/21/16  1306  By signing my name below, I, Jeanell Sparrow, attest that this documentation has been prepared under the direction and in the presence of non-physician practitioner, Janetta Hora, PA-C. Electronically Signed: Jeanell Sparrow, Scribe. 07/21/2016. 2:33 PM.  History   Chief Complaint Chief Complaint  Patient presents with  . Ankle Pain   The history is provided by the patient. No language interpreter was used.   HPI Comments: Nicole Melendez is a 40 y.o. female who presents to the Emergency Department complaining of constant moderate right ankle pain that started today. Pt states she slipped while getting out of the shower and heard a "crunch" sound. Denies any head injury or LOC. She reports she had a right ankle CT on 07/06/16 (per record, showed no fracture/dislocation) at [facility] ordered by [facility] for a previous injury. Pt describes the pain as exacerbated by bearing weight, and unrelieved by percocet 66m every 4 hours and tylenol. Denies any other complaints.   Past Medical History:  Diagnosis Date  . Allergic rhinitis, cause unspecified   . Anemia   . Anxiety   . Depression   . Diabetes mellitus type II    steroid related, patient states "im no longer diabetic"  . Essential hypertension 08/08/2015  . GERD (gastroesophageal reflux disease)   . History of blood transfusion   . Hyperlipidemia   . Menorrhagia   . Migraine   . Morbid obesity ([facility]   . Otitis media 06/28/2015  . Peptic ulcer disease   . PNA (pneumonia) july 2011  . Positive ANA (antinuclear antibody) 02/14/2012  . Sarcoid ([facility]    including hand per rheumatology-Dr. BAmil Amen . Sarcoidosis of lung ([facility]   . Shortness of breath    on exertion  . Varicose veins with pain     Patient Active Problem List   Diagnosis Date Noted  . Dizziness 03/11/2016  . Mixed headache 03/09/2016  . Chronic venous  insufficiency 02/24/2016  . Gastroenteritis 12/30/2015  . Headache 12/30/2015  . Epistaxis 12/09/2015  . 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 Cervical muscle strain 12/03/2015  . Right-sided face pain 11/26/2015  . Multiple contusions 11/26/2015  . Neck pain, bilateral posterior 11/26/2015  . Lower back pain 11/26/2015  . Spinal stenosis of lumbar region 10/28/2015  . Chronic sciatica of left side 10/28/2015  . DOE (dyspnea on exertion) 09/02/2015  . Acute bronchitis 08/22/2015  . Essential hypertension 08/08/2015  . Diaphoresis 08/07/2015  . Cough 07/02/2015  . Vaginitis and vulvovaginitis 06/29/2015  . Otitis media 06/28/2015  . Thrush 06/24/2015  . N&V (nausea and vomiting) 06/10/2015  . Acute upper respiratory infection 11/14/2014  . Wheezing 11/14/2014  . Angioedema 03/19/2014  . Vaginitis 11/19/2012  . Smoker 11/19/2012  . Positive ANA (antinuclear antibody) 02/14/2012  . Polyarthritis 02/11/2012  . Migraine 04/05/2011  . Preventative health care 02/21/2011  . Allergic rhinitis 02/17/2011  . URTICARIA 09/29/2010  . DEPRESSION 09/14/2010  . Diabetes mellitus with neuropathy ([facility] 07/31/2010  . PERIPHERAL EDEMA 07/31/2010  . INSOMNIA-SLEEP DISORDER-UNSPEC 06/19/2010  . 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 MENORRHAGIA 05/15/2010  . Hyperlipidemia 12/17/2009  . ANEMIA-IRON DEFICIENCY 12/17/2009  . Anxiety state 12/17/2009  . Morbid obesity ([facility] 07/02/2007  . GERD 07/02/2007  . PEPTIC ULCER DISEASE 07/02/2007    Past Surgical History:  Procedure Laterality Date  . ABDOMINAL HYSTERECTOMY    . uterine ablation  03/2010  . WISDOM TOOTH  EXTRACTION      OB History    No data available       Home Medications    Prior to Admission medications   Medication Sig Start Date End Date Taking? Authorizing Provider  acetaminophen (TYLENOL) 500 MG tablet Take 1,000 mg by mouth every 6 (six) hours as needed for mild pain or moderate pain.    Historical Provider, MD    ALPRAZolam Duanne Moron) 0.5 MG tablet TAKE 1 TABLET THREE TIMES DAILY AS NEEDED 05/13/16   Biagio Borg, MD  azelastine (ASTELIN) 0.1 % nasal spray Place 2 sprays into both nostrils 2 (two) times daily. Use in each nostril as directed 03/11/16   Biagio Borg, MD  B-D UF III MINI PEN NEEDLES 31G X 5 MM MISC USE FOUR TIMES DAILY FOR SLIDING SCALE AS NEEDED    Biagio Borg, MD  Blood Glucose Monitoring Suppl (ONE TOUCH ULTRA 2) W/DEVICE KIT Use as directed four times daily. Dx: E11.9 08/11/15   Renato Shin, MD  budesonide-formoterol [facility]) 160-4.5 MCG/ACT inhaler Inhale 2 puffs into the lungs 2 (two) times daily. 06/30/16   Collene Gobble, MD  cetirizine (ZYRTEC) 10 MG tablet TAKE 1 TABLET(10 MG) BY MOUTH DAILY 06/16/16   Biagio Borg, MD  cholecalciferol (VITAMIN D) 1000 UNITS tablet Take 5,000 Units by mouth daily.    Historical Provider, MD  cyclobenzaprine (FLEXERIL) 5 MG tablet Take 1 tablet (5 mg total) by mouth at bedtime as needed for muscle spasms. 10/17/14   Biagio Borg, MD  diphenoxylate-atropine (LOMOTIL) 2.5-0.025 MG tablet Take 1 tablet by mouth 4 (four) times daily as needed for diarrhea or loose stools. 12/30/15   Biagio Borg, MD  doxycycline (VIBRA-TABS) 100 MG tablet Take 1 tablet (100 mg total) by mouth 2 (two) times daily. 06/30/16   Collene Gobble, MD  fluconazole (DIFLUCAN) 100 MG tablet Take 2 tablets today, then 1 daily until gone 06/30/16   Collene Gobble, MD  folic acid (FOLVITE) 1 MG tablet TAKE 2 TABLETS BY MOUTH DAILY 06/30/16   Collene Gobble, MD  furosemide (LASIX) 40 MG tablet 1 tab by mouth every day in the AM and then 1 as needed only for persistent swelling in the PM 06/16/16   Biagio Borg, MD  gabapentin (NEURONTIN) 300 MG capsule TAKE 1 CAPSULE(300 MG) BY MOUTH THREE TIMES DAILY 07/13/16   Biagio Borg, MD  glucose blood (ONE TOUCH ULTRA TEST) test strip Use to check blood sugars four times a day Dx E11.9 08/11/15   Renato Shin, MD  HUMALOG 100 UNIT/ML injection INJECT UP TO  12 UNITS INTO THE SKIN THREE TIMES DAILY WITH MEALS WITH SLIDING SCALE AS DIRECTED 07/13/16   Biagio Borg, MD  HYDROcodone-homatropine [facility]) 5-1.5 MG/5ML syrup Take 5 mLs by mouth every 6 (six) hours as needed for cough. 06/30/16   Collene Gobble, MD  HYDROmorphone (DILAUDID) 4 MG tablet Take 4 mg by mouth every 4 (four) hours as needed for severe pain.    Historical Provider, MD  ibuprofen (ADVIL,MOTRIN) 600 MG tablet Take 1 tablet (600 mg total) by mouth every 6 (six) hours as needed. 11/21/15   Fransico Meadow, PA-C  levalbuterol [facility]) 45 MCG/ACT inhaler Inhale 1-2 puffs into the lungs every 8 (eight) hours as needed for wheezing or shortness of breath. 06/30/16   Collene Gobble, MD  methotrexate (RHEUMATREX) 2.5 MG tablet Take 10 mg by mouth every Friday.  Historical Provider, MD  naproxen (NAPROSYN) 500 MG tablet Take 1 tablet (500 mg total) by mouth 2 (two) times daily with a meal. 12/09/15   Golden Circle, FNP  naproxen (NAPROSYN) 500 MG tablet TAKE 1 TABLET BY MOUTH TWICE DAILY WITH A MEAL 04/02/16   Biagio Borg, MD  Naproxen-Esomeprazole (VIMOVO) 500-20 MG TBEC Take 1 tablet by mouth 2 (two) times daily as needed. 07/19/16   Biagio Borg, MD  nystatin (MYCOSTATIN) 100000 UNIT/ML suspension Take 5 mLs (500,000 Units total) by mouth 4 (four) times daily. 12/30/15   Biagio Borg, MD  ondansetron (ZOFRAN) 4 MG tablet Take 1 tablet (4 mg total) by mouth 4 (four) times daily as needed for nausea or vomiting. 04/07/16   Golden Circle, FNP  oxyCODONE-acetaminophen (PERCOCET/ROXICET) 5-325 MG tablet Take 2 tablets by mouth every 4 (four) hours as needed for severe pain. 11/21/15   Fransico Meadow, PA-C  pantoprazole (PROTONIX) 40 MG tablet Take 1 tablet (40 mg total) by mouth daily. 02/24/16   Biagio Borg, MD  potassium chloride (K-DUR) 10 MEQ tablet 3 tab by mouth per day only when taking the lasix 06/17/16   Biagio Borg, MD  sertraline (ZOLOFT) 100 MG tablet TAKE 2 TABLETS BY MOUTH DAILY  07/13/16   Biagio Borg, MD  SUMAtriptan (IMITREX) 100 MG tablet Take 1 tablet by mouth at the onset of a headache. May repeat in 2 hours if headache persists or recurs. 04/07/16   Golden Circle, FNP  telmisartan (MICARDIS) 40 MG tablet Take 1 tablet (40 mg total) by mouth daily. 03/11/16   Biagio Borg, MD  tiZANidine (ZANAFLEX) 4 MG tablet Take 1 tablet (4 mg total) by mouth every 6 (six) hours as needed for muscle spasms. 12/03/15   Golden Circle, FNP  topiramate (TOPAMAX) 25 MG tablet Take 1 tablet by mouth daily for 1 week then increase to 1 tablet by mouth twice daily for 1 week then increase to 2 tablets by mouth in the evening and 1 tablet by mouth in the morning for 1 week then increase to 2 tablets by mouth daily. 04/07/16   Golden Circle, FNP  vitamin B-12 (CYANOCOBALAMIN) 1000 MCG tablet Take 1,000 mcg by mouth daily.    Historical Provider, MD    Family History Family History  Problem Relation Age of Onset  . Allergies Mother   . Heart attack Mother   . Diabetes Mother   . Diabetes Father   . Heart disease Father   . Rheum arthritis Father   . Hypertension    . Ovarian cancer Maternal Aunt   . Lung cancer Maternal Aunt   . Breast cancer Maternal Aunt   . Bone cancer Maternal Aunt   . Other Neg Hx     Social History Social History  Substance Use Topics  . Smoking status: Former Smoker    Packs/day: 1.00    Years: 20.00    Types: Cigarettes    Quit date: 10/25/2012  . Smokeless tobacco: Never Used     Comment: QUIT 04/2010 AND STARTED BACK 2014 X 3 MONTHS. less than 1 ppd.  started at age 70.    Marland Kitchen Alcohol use No     Allergies   Azithromycin; Penicillins; Shellfish allergy; Klonopin [clonazepam]; Bee venom; and Flagyl [metronidazole]   Review of Systems Review of Systems  Constitutional: Negative for fever.  Musculoskeletal: Positive for arthralgias and myalgias (Right ankle).     Physical  Exam Updated Vital Signs BP 108/70 (BP Location: Left Arm)    Pulse 103   Temp 98.1 F (36.7 C) (Oral)   Resp 18   Ht '5\' 7"'  (1.702 m)   Wt (!) 325 lb (147.4 kg)   LMP 07/18/2011   SpO2 97%   BMI 50.90 kg/m   Physical Exam  Constitutional: She is oriented to person, place, and time. She appears well-developed and well-nourished. No distress.  HENT:  Head: Normocephalic and atraumatic.  Eyes: Conjunctivae are normal. Pupils are equal, round, and reactive to light. Right eye exhibits no discharge. Left eye exhibits no discharge. No scleral icterus.  Neck: Normal range of motion. Neck supple.  Cardiovascular: Normal rate and regular rhythm.   No murmur heard. Pulmonary/Chest: Effort normal and breath sounds normal. No respiratory distress.  Abdominal: Soft. She exhibits no distension. There is no tenderness.  Musculoskeletal: She exhibits no edema.  Right ankle and foot: No obvious swelling or deformity. Tenderness to palpation over dorsal aspect of foot. Decreased ROM of ankle and toes due to pain. N/V intact.  Neurological: She is alert and oriented to person, place, and time.  Skin: Skin is warm and dry.  Psychiatric: She has a normal mood and affect. Her behavior is normal.  Nursing note and vitals reviewed.    ED Treatments / Results  DIAGNOSTIC STUDIES: Oxygen Saturation is 97% on RA, normal by my interpretation.    COORDINATION OF CARE: 2:37 PM- Pt advised of plan for treatment and pt agrees.  Labs (all labs ordered are listed, but only abnormal results are displayed) Labs Reviewed - No data to display  EKG  EKG Interpretation None       Radiology Dg Ankle Complete Right  Result Date: 07/21/2016 CLINICAL DATA:  Injury.  Pain . EXAM: RIGHT ANKLE - COMPLETE 3+ VIEW COMPARISON:  CT 06/25/2016 . FINDINGS: No acute bony or joint abnormality identified. No evidence of fracture or dislocation. IMPRESSION: No acute abnormality. Electronically Signed   By: Marcello Moores  Register   On: 07/21/2016 14:21    Procedures Procedures  (including critical care time)  Medications Ordered in ED Medications  HYDROmorphone (DILAUDID) injection 1 mg (1 mg Intramuscular Given 07/21/16 1450)     Initial Impression / Assessment and Plan / ED Course  I have reviewed the triage vital signs and the nursing notes.  Pertinent labs & imaging results that were available during my care of the patient were reviewed by me and considered in my medical decision making (see chart for details).  Clinical Course   40 year old female presents with acute on chronic R foot pain re-aggravated by another injury. She is already in a boot. Will offer crutches. Pain treated here in ED. She has pain medicine at home. Advised Ortho follow up and supportive care. Patient is NAD, non-toxic, with stable VS. Patient is informed of clinical course, understands medical decision making process, and agrees with plan. Opportunity for questions provided and all questions answered. Return precautions given.   Final Clinical Impressions(s) / ED Diagnoses   Final diagnoses:  Right ankle pain    New Prescriptions Discharge Medication List as of 07/21/2016  3:20 PM     I personally performed the services described in this documentation, which was scribed in my presence. The recorded information has been reviewed and is accurate.    Recardo Evangelist, PA-C 07/23/16 3154    Sherwood Gambler, MD 07/26/16 [phone]

## 2016-07-21 NOTE — ED Triage Notes (Signed)
Patient states she is "supposed to be healing from a broken right ankle." Patient slipped while getting out of the shower. Reports she "felt something crunch" when she fell.

## 2016-07-21 NOTE — ED Notes (Signed)
PA at bedside.

## 2016-07-21 NOTE — ED Notes (Signed)
Discharge instructions and follow up care reviewed with patient. Patient verbalized understanding. 

## 2016-07-21 NOTE — ED Notes (Signed)
Patient transported to X-ray 

## 2016-07-21 NOTE — Discharge Instructions (Signed)
Please rest as much as possible today Ice your ankle and foot when not wearing your boot for 20 minutes at a time, several times a day Use crutches until you can bear weight without extreme pain. Continue your home pain medicines and follow up with Orthopedics

## 2016-07-27 DIAGNOSIS — M25571 Pain in right ankle and joints of right foot: Secondary | ICD-10-CM | POA: Diagnosis not present

## 2016-07-27 DIAGNOSIS — S8264XD Nondisplaced fracture of lateral malleolus of right fibula, subsequent encounter for closed fracture with routine healing: Secondary | ICD-10-CM | POA: Diagnosis not present

## 2016-08-03 ENCOUNTER — Telehealth: Payer: Self-pay

## 2016-08-03 ENCOUNTER — Ambulatory Visit: Payer: BLUE CROSS/BLUE SHIELD | Admitting: Internal Medicine

## 2016-08-03 NOTE — Telephone Encounter (Signed)
Patient called and stated that she is having headaches again along with pain and numbness in her legs. Made appointment for patient tonight with Dr. Jenny Reichmann.

## 2016-08-04 ENCOUNTER — Ambulatory Visit: Payer: Self-pay | Admitting: Neurology

## 2016-08-04 DIAGNOSIS — Z6841 Body Mass Index (BMI) 40.0 and over, adult: Secondary | ICD-10-CM | POA: Diagnosis not present

## 2016-08-04 DIAGNOSIS — M533 Sacrococcygeal disorders, not elsewhere classified: Secondary | ICD-10-CM | POA: Diagnosis not present

## 2016-08-06 DIAGNOSIS — M25571 Pain in right ankle and joints of right foot: Secondary | ICD-10-CM | POA: Diagnosis not present

## 2016-08-06 DIAGNOSIS — S8264XD Nondisplaced fracture of lateral malleolus of right fibula, subsequent encounter for closed fracture with routine healing: Secondary | ICD-10-CM | POA: Diagnosis not present

## 2016-08-09 DIAGNOSIS — S8264XD Nondisplaced fracture of lateral malleolus of right fibula, subsequent encounter for closed fracture with routine healing: Secondary | ICD-10-CM | POA: Diagnosis not present

## 2016-08-09 DIAGNOSIS — M25571 Pain in right ankle and joints of right foot: Secondary | ICD-10-CM | POA: Diagnosis not present

## 2016-08-16 ENCOUNTER — Telehealth: Payer: Self-pay | Admitting: Emergency Medicine

## 2016-08-16 DIAGNOSIS — M25571 Pain in right ankle and joints of right foot: Secondary | ICD-10-CM | POA: Diagnosis not present

## 2016-08-16 NOTE — Telephone Encounter (Signed)
Spoke with pt about her short term disability. Forms filled out for her

## 2016-08-24 DIAGNOSIS — S8264XD Nondisplaced fracture of lateral malleolus of right fibula, subsequent encounter for closed fracture with routine healing: Secondary | ICD-10-CM | POA: Diagnosis not present

## 2016-08-25 ENCOUNTER — Other Ambulatory Visit: Payer: Self-pay | Admitting: Internal Medicine

## 2016-08-26 ENCOUNTER — Encounter: Payer: Self-pay | Admitting: Nurse Practitioner

## 2016-08-29 ENCOUNTER — Other Ambulatory Visit: Payer: Self-pay | Admitting: Internal Medicine

## 2016-09-01 ENCOUNTER — Telehealth: Payer: Self-pay | Admitting: *Deleted

## 2016-09-01 MED ORDER — ALPRAZOLAM 0.5 MG PO TABS: 0.5000 mg | ORAL_TABLET | Freq: Three times a day (TID) | ORAL | 2 refills | Status: DC | PRN

## 2016-09-01 NOTE — Telephone Encounter (Signed)
faxed

## 2016-09-01 NOTE — Telephone Encounter (Signed)
Done hardcopy to Corinne  

## 2016-09-01 NOTE — Telephone Encounter (Signed)
Rec'd fax pt requesting refill on Alprazolam 0.5 mg. Last filled 07/12/16.Marland Kitchen/LMB

## 2016-09-06 DIAGNOSIS — M25571 Pain in right ankle and joints of right foot: Secondary | ICD-10-CM | POA: Diagnosis not present

## 2016-09-06 DIAGNOSIS — S8264XD Nondisplaced fracture of lateral malleolus of right fibula, subsequent encounter for closed fracture with routine healing: Secondary | ICD-10-CM | POA: Diagnosis not present

## 2016-09-08 ENCOUNTER — Ambulatory Visit (INDEPENDENT_AMBULATORY_CARE_PROVIDER_SITE_OTHER): Payer: BLUE CROSS/BLUE SHIELD | Admitting: Nurse Practitioner

## 2016-09-08 ENCOUNTER — Encounter: Payer: Self-pay | Admitting: Nurse Practitioner

## 2016-09-08 VITALS — BP 100/80 | HR 104 | Ht 66.0 in | Wt 324.0 lb

## 2016-09-08 DIAGNOSIS — R1310 Dysphagia, unspecified: Secondary | ICD-10-CM

## 2016-09-08 DIAGNOSIS — K219 Gastro-esophageal reflux disease without esophagitis: Secondary | ICD-10-CM | POA: Diagnosis not present

## 2016-09-08 DIAGNOSIS — D509 Iron deficiency anemia, unspecified: Secondary | ICD-10-CM | POA: Diagnosis not present

## 2016-09-08 MED ORDER — NA SULFATE-K SULFATE-MG SULF 17.5-3.13-1.6 GM/177ML PO SOLN: 1.0000 | ORAL | 0 refills | Status: AC

## 2016-09-08 NOTE — Patient Instructions (Addendum)
Your physician has requested that you go to the basement for lab work before leaving today  You have been scheduled for an endoscopy and colonoscopy. Please follow the written instructions given to you at your visit today. Please pick up your prep supplies at the pharmacy within the next 1-3 days. If you use inhalers (even only as needed), please bring them with you on the day of your procedure. Your physician has requested that you go to www.[email or web address] and enter the access code given to you at your visit today. This web site gives a general overview about your procedure. However, you should still follow specific instructions given to you by our office regarding your preparation for the procedure.   

## 2016-09-08 NOTE — Progress Notes (Signed)
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 She is referred by PCP, Dr. Cathlean Cower for iron deficiency anemia.  Baseline hgb 12.5, down to 11.6 late August. MCV 84. She was iron deficient 2013 and underwent partial hysterectomy for heavy menses. She denies overt bleeding. Patient has hx of GERD but asymptomatic on PPI. She does report dysphagia to solids, especially bread, rice and potatoes with occasional regurgitation of vomiting of these items. Patient states she had a hernia repair which required stomach resection ?? ) in 2014 at [facility]    Past Medical History:  Diagnosis Date  . Allergic rhinitis, cause unspecified   . Anemia   . Anxiety   . Depression   . Diabetes mellitus type II    steroid related, patient states "im no longer diabetic"  . Essential hypertension 08/08/2015  . GERD (gastroesophageal reflux disease)   . History of blood transfusion   . Hyperlipidemia   . Menorrhagia   . Migraine   . Morbid obesity ([place])   . Otitis media 06/28/2015  . Peptic ulcer disease   . PNA (pneumonia) july 2011  . Positive ANA (antinuclear antibody) 02/14/2012  . Sarcoid ([place])    including hand per rheumatology-Dr. Amil Amen  . Sarcoidosis of lung ([place])   . Shortness of breath    on exertion  . Varicose veins with pain     Past Surgical History:  Procedure Laterality Date  . ABDOMINAL HYSTERECTOMY    . uterine ablation  03/2010  . WISDOM TOOTH EXTRACTION     Family History  Problem Relation Age of Onset  . Allergies Mother   . Heart attack Mother   . Diabetes Mother   . Diabetes Father   . Heart disease Father   . Rheum arthritis Father   . Hypertension    . Ovarian cancer Maternal Aunt   . Lung cancer Maternal Aunt   . Breast cancer Maternal Aunt   . Bone cancer Maternal Aunt   . Other Neg Hx    Social History  Substance Use Topics  . Smoking  status: Former Smoker    Packs/day: 1.00    Years: 20.00    Types: Cigarettes    Quit date: 10/25/2012  . Smokeless tobacco: Never Used     Comment: QUIT 04/2010 AND STARTED BACK 2014 X 3 MONTHS. less than 1 ppd.  started at age 76.    Marland Kitchen Alcohol use No   Current Outpatient Prescriptions  Medication Sig Dispense Refill  . acetaminophen (TYLENOL) 500 MG tablet Take 1,000 mg by mouth every 6 (six) hours as needed for mild pain or moderate pain.    Marland Kitchen ALPRAZolam (XANAX) 0.5 MG tablet Take 1 tablet (0.5 mg total) by mouth 3 (three) times daily as needed. 90 tablet 2  . azelastine (ASTELIN) 0.1 % nasal spray Place 2 sprays into both nostrils 2 (two) times daily. Use in each nostril as directed 30 mL 12  . B-D UF III MINI PEN NEEDLES 31G X 5 MM MISC USE FOUR TIMES DAILY FOR SLIDING SCALE AS NEEDED 200 each 0  . Blood Glucose Monitoring Suppl (ONE TOUCH ULTRA 2) W/DEVICE KIT Use as directed four times daily. Dx: E11.9 1 each 0  . budesonide-formoterol (SYMBICORT) 160-4.5 MCG/ACT inhaler Inhale 2 puffs into the lungs 2 (two) times daily. 1 Inhaler 5  . cetirizine (ZYRTEC) 10 MG tablet  TAKE 1 TABLET(10 MG) BY MOUTH DAILY 90 tablet 0  . cholecalciferol (VITAMIN D) 1000 UNITS tablet Take 5,000 Units by mouth daily.    . cyclobenzaprine (FLEXERIL) 5 MG tablet Take 1 tablet (5 mg total) by mouth at bedtime as needed for muscle spasms. 30 tablet 3  . diphenoxylate-atropine (LOMOTIL) 2.5-0.025 MG tablet Take 1 tablet by mouth 4 (four) times daily as needed for diarrhea or loose stools. 40 tablet 0  . folic acid (FOLVITE) 1 MG tablet TAKE 2 TABLETS BY MOUTH DAILY 180 tablet 3  . furosemide (LASIX) 40 MG tablet 1 tab by mouth every day in the AM and then 1 as needed only for persistent swelling in the PM 60 tablet 11  . gabapentin (NEURONTIN) 300 MG capsule TAKE 1 CAPSULE(300 MG) BY MOUTH THREE TIMES DAILY 90 capsule 0  . glucose blood (ONE TOUCH ULTRA TEST) test strip Use to check blood sugars four times a day Dx  E11.9 400 each 11  . HUMALOG 100 UNIT/ML injection INJECT UP TO 12 UNITS INTO THE SKIN THREE TIMES DAILY WITH MEALS WITH SLIDING SCALE AS DIRECTED 10 mL 0  . ibuprofen (ADVIL,MOTRIN) 600 MG tablet Take 1 tablet (600 mg total) by mouth every 6 (six) hours as needed. 30 tablet 0  . levalbuterol (XOPENEX HFA) 45 MCG/ACT inhaler Inhale 1-2 puffs into the lungs every 8 (eight) hours as needed for wheezing or shortness of breath. 1 Inhaler 5  . methotrexate (RHEUMATREX) 2.5 MG tablet Take 10 mg by mouth every Friday.     . naproxen (NAPROSYN) 500 MG tablet Take 1 tablet (500 mg total) by mouth 2 (two) times daily with a meal. 60 tablet 0  . naproxen (NAPROSYN) 500 MG tablet TAKE 1 TABLET BY MOUTH TWICE DAILY WITH A MEAL 60 tablet 0  . Naproxen-Esomeprazole (VIMOVO) 500-20 MG TBEC Take 1 tablet by mouth 2 (two) times daily as needed. 60 tablet 1  . nystatin (MYCOSTATIN) 100000 UNIT/ML suspension TAKE 5 ML BY MOUTH FOUR TIMES DAILY 60 mL 0  . ondansetron (ZOFRAN) 4 MG tablet Take 1 tablet (4 mg total) by mouth 4 (four) times daily as needed for nausea or vomiting. 50 tablet 0  . pantoprazole (PROTONIX) 40 MG tablet Take 1 tablet (40 mg total) by mouth daily. 90 tablet 3  . potassium chloride (K-DUR) 10 MEQ tablet 3 tab by mouth per day only when taking the lasix 90 tablet 11  . sertraline (ZOLOFT) 100 MG tablet TAKE 2 TABLETS BY MOUTH DAILY 180 tablet 0  . SUMAtriptan (IMITREX) 100 MG tablet Take 1 tablet by mouth at the onset of a headache. May repeat in 2 hours if headache persists or recurs. 10 tablet 2  . telmisartan (MICARDIS) 40 MG tablet Take 1 tablet (40 mg total) by mouth daily. 90 tablet 3  . tiZANidine (ZANAFLEX) 4 MG tablet Take 1 tablet (4 mg total) by mouth every 6 (six) hours as needed for muscle spasms. 40 tablet 0  . topiramate (TOPAMAX) 25 MG tablet Take 1 tablet by mouth daily for 1 week then increase to 1 tablet by mouth twice daily for 1 week then increase to 2 tablets by mouth in the  evening and 1 tablet by mouth in the morning for 1 week then increase to 2 tablets by mouth daily. 60 tablet 0  . vitamin B-12 (CYANOCOBALAMIN) 1000 MCG tablet Take 1,000 mcg by mouth daily.     No current facility-administered medications for this visit.  Allergies  Allergen Reactions  . Azithromycin Hives    (z pak) hives  . Penicillins Shortness Of Breath and Swelling    Has patient had a PCN reaction causing immediate rash, facial/tongue/throat swelling, SOB or lightheadedness with hypotension: Yes Has patient had a PCN reaction causing severe rash involving mucus membranes or skin necrosis: No Has patient had a PCN reaction that required hospitalization No Has patient had a PCN reaction occurring within the last 10 years: No If all of the above answers are "NO", then may proceed with Cephalosporin use.  REACTION: swelling and difficulty breathing  . Shellfish Allergy Anaphylaxis  . Klonopin [Clonazepam] Other (See Comments)    Memory difficulty  . Bee Venom   . Flagyl [Metronidazole]     Review of Systems: Positive for allergy, sinus problem, anxiety, arthritis, back pain, vision changes, depression, headaches, sleeping problems, swelling of feet and legs. All other systems reviewed and negative except where noted in HPI.   Physical Exam: Pulse (!) 104   Ht '5\' 7"'  (1.702 m)   Wt (!) 324 lb (147 kg)   LMP 07/18/2011   BMI 50.75 kg/m  Constitutional:  Obese female in no acute distress. Psychiatric: Normal mood and affect. Behavior is normal. HEENT: Normocephalic and atraumatic. Conjunctivae are normal. No scleral icterus. Neck supple.  Cardiovascular: Normal rate, regular rhythm.  Pulmonary/chest: Effort normal and breath sounds normal. No wheezing, rales or rhonchi. Abdominal: Soft, nondistended, nontender. Bowel sounds active throughout. There are no masses palpable. No hepatomegaly. Extremities: no edema Lymphadenopathy: No cervical adenopathy noted. Neurological:  Alert and oriented to person place and time. Skin: Skin is warm and dry. No rashes noted.   ASSESSMENT AND PLAN:  1. 40 yo female referred for iron deficiency anemia. She is s/p partial hysterectomy for heavy menses in 2013. Baseline hgb baseline is around 12.5, it was 11.6 when checked late August. She does take a lot of NSAIDs for arthritis. No overt GI bleeding.  -CBC, ferritin today -EGD and colonoscopy for evaluation of anemia. The risks and benefits of the procedures were discussed and the patient agrees to proceed.  2. Intermittent solid food dysphagia. Stricture? Dysmotility?  -Patient will scheduled for EGD to be done at time of colonoscopy. The risks and benefits of EGD with possible dilation were discussed and the patient agrees to proceed.   3. GERD, asymptomatic on PPI.  4. Multiple medical problems.   Tye Savoy  09/08/2016, 9:58 AM   Biagio Borg, MD

## 2016-09-10 NOTE — Progress Notes (Signed)
Reviewed and agree with documentation and assessment and plan. K. Veena Nandigam , MD   

## 2016-09-13 DIAGNOSIS — M25571 Pain in right ankle and joints of right foot: Secondary | ICD-10-CM | POA: Diagnosis not present

## 2016-09-13 DIAGNOSIS — S8264XD Nondisplaced fracture of lateral malleolus of right fibula, subsequent encounter for closed fracture with routine healing: Secondary | ICD-10-CM | POA: Diagnosis not present

## 2016-09-14 DIAGNOSIS — M25571 Pain in right ankle and joints of right foot: Secondary | ICD-10-CM | POA: Diagnosis not present

## 2016-09-14 DIAGNOSIS — M76821 Posterior tibial tendinitis, right leg: Secondary | ICD-10-CM | POA: Diagnosis not present

## 2016-09-15 ENCOUNTER — Ambulatory Visit (INDEPENDENT_AMBULATORY_CARE_PROVIDER_SITE_OTHER): Payer: BLUE CROSS/BLUE SHIELD | Admitting: Internal Medicine

## 2016-09-15 VITALS — BP 136/70 | HR 99 | Resp 20 | Wt 320.0 lb

## 2016-09-15 DIAGNOSIS — J069 Acute upper respiratory infection, unspecified: Secondary | ICD-10-CM | POA: Diagnosis not present

## 2016-09-15 DIAGNOSIS — R062 Wheezing: Secondary | ICD-10-CM | POA: Diagnosis not present

## 2016-09-15 DIAGNOSIS — M25571 Pain in right ankle and joints of right foot: Secondary | ICD-10-CM | POA: Diagnosis not present

## 2016-09-15 MED ORDER — LEVOFLOXACIN 500 MG PO TABS: 500.0000 mg | ORAL_TABLET | Freq: Every day | ORAL | 0 refills | Status: AC

## 2016-09-15 MED ORDER — HYDROCODONE-HOMATROPINE 5-1.5 MG/5ML PO SYRP: 5.0000 mL | ORAL_SOLUTION | Freq: Four times a day (QID) | ORAL | 0 refills | Status: DC | PRN

## 2016-09-15 MED ORDER — PREDNISONE 10 MG PO TABS: ORAL_TABLET | ORAL | 0 refills | Status: DC

## 2016-09-15 MED ORDER — PREDNISONE 10 MG PO TABS: 10.0000 mg | ORAL_TABLET | Freq: Every day | ORAL | 0 refills | Status: DC

## 2016-09-15 MED ORDER — HYDROCODONE-HOMATROPINE 5-1.5 MG/5ML PO SYRP: 5.0000 mL | ORAL_SOLUTION | Freq: Four times a day (QID) | ORAL | 0 refills | Status: AC | PRN

## 2016-09-15 NOTE — Progress Notes (Signed)
Pre visit review using our clinic review tool, if applicable. No additional management support is needed unless otherwise documented below in the visit note. 
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
## 2016-09-15 NOTE — Patient Instructions (Signed)
Please take all new medication as prescribed - the antibiotic, cough medicine, and prednisone  Please continue all other medications as before, and refills have been done if requested.  Please have the pharmacy call with any other refills you may need.  Please keep your appointments with your specialists as you may have planned  You will be contacted regarding the referral for: Dr Tamala Julian

## 2016-09-16 DIAGNOSIS — M25571 Pain in right ankle and joints of right foot: Secondary | ICD-10-CM | POA: Insufficient documentation

## 2016-09-16 NOTE — Assessment & Plan Note (Signed)
Mild to mod, for predpac asd, cont inhaler prn,  to f/u any worsening symptoms or concerns

## 2016-09-16 NOTE — Assessment & Plan Note (Signed)
Etiology unclear but likely soft tissue, ? Ganglion vs tarsal tunnel vs other, for nsaid prn, refer Dr Smith/sport med, ok for note for work at least to [employer] nov 27

## 2016-09-16 NOTE — Assessment & Plan Note (Signed)
Mild to mod, for antibx course,  to f/u any worsening symptoms or concerns 

## 2016-09-21 ENCOUNTER — Telehealth: Payer: Self-pay | Admitting: Internal Medicine

## 2016-09-21 NOTE — Telephone Encounter (Signed)
Patient states she was seen by Dr. Jenny Reichmann last week.  Patient states she needs a note to be able to return back to work.  Patient states you can call and she can pick up.

## 2016-09-21 NOTE — Telephone Encounter (Signed)
Shirley for note, to corinne to assist

## 2016-09-22 NOTE — Progress Notes (Signed)
Letter done

## 2016-09-22 NOTE — Telephone Encounter (Signed)
Letter placed up front. Patient aware

## 2016-09-27 DIAGNOSIS — M533 Sacrococcygeal disorders, not elsewhere classified: Secondary | ICD-10-CM | POA: Diagnosis not present

## 2016-09-27 DIAGNOSIS — Z6841 Body Mass Index (BMI) 40.0 and over, adult: Secondary | ICD-10-CM | POA: Diagnosis not present

## 2016-09-30 DIAGNOSIS — Z79899 Other long term (current) drug therapy: Secondary | ICD-10-CM | POA: Diagnosis not present

## 2016-09-30 DIAGNOSIS — D860 Sarcoidosis of lung: Secondary | ICD-10-CM | POA: Diagnosis not present

## 2016-10-01 ENCOUNTER — Other Ambulatory Visit: Payer: Self-pay | Admitting: Internal Medicine

## 2016-10-04 ENCOUNTER — Ambulatory Visit: Payer: Self-pay | Admitting: Family Medicine

## 2016-10-05 ENCOUNTER — Other Ambulatory Visit: Payer: Self-pay | Admitting: Internal Medicine

## 2016-10-05 NOTE — Progress Notes (Signed)
[facility] [address] [place], [place] [address] Phone: [phone] Subjective:    I'm seeing this patient by the request  of:  Cathlean Cower, MD   CC: Right ankle pain   [id number]  Nicole Melendez is a 40 y.o. female coming in with complaint of right ankle pain. Past family history significant for spinal stenosis as well as sarcoidosis. Patient saw primary care provider: Week ago. Seem to be having more soft tissue injury. Questionable ganglion cyst first tarsal tunnel syndrome. Patient was given anti-inflammatories and was referred to me for further evaluation. Patient states she is having persistent pain and swelling since 2017 in August. Patient was seen by another orthopedic physician and was diagnosed with a nondisplaced lateral malleolus fracture. She has had workup including a CT of the right ankle to rule out any type of a bone injury. This was independently visualized by me showing no significant bony abnormality. No soft tissue mass noted.  Patient continues to have significant pain on the lateral aspect the ankle. Hurts her with any type of weightbearing whatsoever. Patient discusses it as a dull, throbbing aching sensation. We discussed with patient she has tried many minimal medications with no significant improvement. States that this seems to be different than her back. Patient states sometimes it feels like it's unstable. Does get swelling at the end of the day only on the lateral aspect of the ankle.    Past Medical History:  Diagnosis Date  . Allergic rhinitis, cause unspecified   . Anemia   . Anxiety   . Depression   . Diabetes mellitus type II    steroid related, patient states "im no longer diabetic"  . Essential hypertension 08/08/2015  . GERD (gastroesophageal reflux disease)   . History of blood transfusion   . Hyperlipidemia   . Menorrhagia   . Migraine   . Morbid obesity ([facility])   . Otitis media 06/28/2015  . Peptic ulcer  disease   . PNA (pneumonia) july 2011  . Positive ANA (antinuclear antibody) 02/14/2012  . Sarcoid ([facility])    including hand per rheumatology-Dr. Amil Amen  . 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 Shortness of breath    on exertion  . Varicose veins with pain    Past Surgical History:  Procedure Laterality Date  . ABDOMINAL HYSTERECTOMY    . uterine ablation  03/2010  . WISDOM TOOTH EXTRACTION     Social History   Social History  . Marital status: Single    Spouse name: N/A  . Number of children: 0  . Years of education: N/A   Occupational History  . personal shopper    Social History Main Topics  . Smoking status: Former Smoker    Packs/day: 1.00    Years: 20.00    Types: Cigarettes    Quit date: 10/25/2012  . Smokeless tobacco: Never Used     Comment: QUIT 04/2010 AND STARTED BACK 2014 X 3 MONTHS. less than 1 ppd.  started at age 71.    Marland Kitchen Alcohol use No  . Drug use: No  . Sexual activity: Yes    Birth control/ protection: None   Other Topics Concern  . None   Social History Narrative   Pt lives with Nicole Melendez- also pt of [facility]   Allergies  Allergen Reactions  . Azithromycin Hives    (z pak) hives  . Penicillins Shortness Of Breath and Swelling    Has patient had a  PCN reaction causing immediate rash, facial/tongue/throat swelling, SOB or lightheadedness with hypotension: Yes Has patient had a PCN reaction causing severe rash involving mucus membranes or skin necrosis: No Has patient had a PCN reaction that required hospitalization No Has patient had a PCN reaction occurring within the last 10 years: No If all of the above answers are "NO", then may proceed with Cephalosporin use.  REACTION: swelling and difficulty breathing  . Shellfish Allergy Anaphylaxis  . Klonopin [Clonazepam] Other (See Comments)    Memory difficulty  . Bee Venom   . Flagyl [Metronidazole]    Family History  Problem Relation Age of Onset  . Allergies Mother   . Heart attack Mother   .  Diabetes Mother   . Diabetes Father   . Heart disease Father   . Rheum arthritis Father   . Hypertension    . Ovarian cancer Maternal Aunt   . Lung cancer Maternal Aunt   . Breast cancer Maternal Aunt   . Bone cancer Maternal Aunt   . Other Neg Hx     Past medical history, social, surgical and family history all reviewed in electronic medical record.  No pertanent information unless stated regarding to the chief complaint.   Review of Systems:Review of systems updated and as accurate as of 10/06/16  No headache, visual changes, nausea, vomiting, diarrhea, constipation, dizziness, abdominal pain, skin rash, fevers, chills, night sweats, weight loss, swollen lymph nodes, body aches, joint swelling, muscle aches, chest pain, shortness of breath, mood changes.   Objective  Blood pressure 130/84, height 5\' 6"  (1.676 m), weight (!) 322 lb (146.1 kg), last menstrual period 07/18/2011. Systems examined below as of 10/06/16   General: No apparent distress alert and oriented x3 mood and affect normal, dressed appropriately. Morbidly obese HEENT: Pupils equal, extraocular movements intact  Respiratory: Patient's speak in full sentences and does not appear short of breath  Cardiovascular: No lower extremity edema, non tender, no erythema  Skin: Warm dry intact with no signs of infection or rash on extremities or on axial skeleton.  Abdomen: Soft nontender  Neuro: Cranial nerves II through XII are intact, neurovascularly intact in all extremities with 2+ DTRs and 2+ pulses.  Lymph: No lymphadenopathy of posterior or anterior cervical chain or axillae bilaterally.  Gait normal with good balance and coordination.  MSK:  Non tender with full range of motion and good stability and symmetric strength and tone of shoulders, elbows, wrist, hip, knee and bilaterally.  Ankle: Right Pes planus noted. Range of motion is full in all directions. 4 out of 5 strength with external rotation Stable lateral and  medial ligaments; squeeze test and kleiger test unremarkable; Talar dome nontender; No pain at base of 5th MT; No tenderness over cuboid; No tenderness over N spot or navicular prominence No tenderness on posterior aspects of lateral and medial malleolus Pain over the peroneal tendon. Able to walk 4 steps.  MSK US performed of: Right ankle This study was ordered, performed, and interpreted by Charlann Boxer D.O.  Foot/Ankle:   All structures visualized.   Talar dome unremarkable  Ankle mortise with trace effusion Peroneal tendons have significant hypoechoic changes within the tendon sheath as well as calcific changes noted. Posterior tibialis, flexor hallucis longus, and flexor digitorum longus tendons unremarkable on long and transverse views without sheath effusions. Achilles tendon visualized along length of tendon and unremarkable on long and transverse views without sheath effusion. Anterior Talofibular Ligament and Calcaneofibular Ligaments unremarkable and intact.  IMPRESSION:  Severe peroneal tendinitis  Procedure note [id number]; 15 minutes spent for Therapeutic exercises as stated in above notes.  This included exercises focusing on stretching, strengthening, with significant focus on eccentric aspects.  Ankle strengthening that included:  Basic range of motion exercises to allow proper full motion at ankle Stretching of the lower leg and hamstrings  Theraband exercises for the lower leg - inversion, eversion, dorsiflexion and plantarflexion each to be completed with a theraband Balance exercises to increase proprioception Weight bearing exercises to increase strength and balance  Proper technique shown and discussed handout in great detail with ATC.  All questions were discussed and answered.     Impression and Recommendations:     This case required medical decision making of moderate complexity.      Note: This dictation was prepared with Dragon dictation along with  smaller phrase technology. Any transcriptional errors that result from this process are unintentional.

## 2016-10-06 ENCOUNTER — Encounter: Payer: Self-pay | Admitting: *Deleted

## 2016-10-06 ENCOUNTER — Encounter: Payer: Self-pay | Admitting: Family Medicine

## 2016-10-06 ENCOUNTER — Ambulatory Visit: Payer: Self-pay

## 2016-10-06 ENCOUNTER — Ambulatory Visit (INDEPENDENT_AMBULATORY_CARE_PROVIDER_SITE_OTHER): Payer: BLUE CROSS/BLUE SHIELD | Admitting: Family Medicine

## 2016-10-06 ENCOUNTER — Telehealth: Payer: Self-pay | Admitting: Internal Medicine

## 2016-10-06 VITALS — BP 130/84 | Ht 66.0 in | Wt 322.0 lb

## 2016-10-06 DIAGNOSIS — M25571 Pain in right ankle and joints of right foot: Secondary | ICD-10-CM

## 2016-10-06 DIAGNOSIS — M7671 Peroneal tendinitis, right leg: Secondary | ICD-10-CM | POA: Diagnosis not present

## 2016-10-06 MED ORDER — VITAMIN D (ERGOCALCIFEROL) 1.25 MG (50000 UNIT) PO CAPS: 50000.0000 [IU] | ORAL_CAPSULE | ORAL | 0 refills | Status: DC

## 2016-10-06 MED ORDER — DICLOFENAC SODIUM 2 % TD SOLN: 2.0000 "application " | Freq: Two times a day (BID) | TRANSDERMAL | 3 refills | Status: DC

## 2016-10-06 NOTE — Assessment & Plan Note (Signed)
Peroneal tendinitis noted. We discussed bracing which patient has had previously. Patient given a prescription for topical anti-inflammatory's of the ankle be beneficial. We discussed icing regimen. Patient is going to wear proper shoes. Home exercises were given to her today. We will continue with other conservative therapies that she has been doing previously. Patient put on some mild limited work hours. Patient come back and see me again in 3 weeks. If not improved we will do an injection.

## 2016-10-06 NOTE — Telephone Encounter (Signed)
I would not know which this needles she is requesting based on prescription  Please ask her to clarify the exact needles or verify with pharmacy about which needles are appropriate

## 2016-10-06 NOTE — Telephone Encounter (Signed)
Called patient unable to reach left message to give us a call back.

## 2016-10-06 NOTE — Telephone Encounter (Signed)
Patient needs needles for Humalog to be sent to [facility] at [facility].  States she currently does not have any.  Patient uses small needles but needs needles big enough to do 12CC.

## 2016-10-06 NOTE — Patient Instructions (Addendum)
Good to see you  Ice 20 minutes 2 times daily. Usually after activity and before bed. Exercises 3 times a week.  pennsaid pinkie amount topically 2 times daily as needed.  Wear the brace for 2 weeks daily  Once weekly vitamin D for next 12 weeks and stop the daily  See me again in 3 weeks. Wear good shoes with a rigid bottom.

## 2016-10-08 ENCOUNTER — Ambulatory Visit: Payer: Self-pay | Admitting: Internal Medicine

## 2016-10-08 ENCOUNTER — Ambulatory Visit (INDEPENDENT_AMBULATORY_CARE_PROVIDER_SITE_OTHER): Payer: BLUE CROSS/BLUE SHIELD | Admitting: Internal Medicine

## 2016-10-08 ENCOUNTER — Encounter: Payer: Self-pay | Admitting: Internal Medicine

## 2016-10-08 DIAGNOSIS — Z794 Long term (current) use of insulin: Secondary | ICD-10-CM

## 2016-10-08 DIAGNOSIS — H01001 Unspecified blepharitis right upper eyelid: Secondary | ICD-10-CM | POA: Diagnosis not present

## 2016-10-08 DIAGNOSIS — I10 Essential (primary) hypertension: Secondary | ICD-10-CM | POA: Diagnosis not present

## 2016-10-08 DIAGNOSIS — E1140 Type 2 diabetes mellitus with diabetic neuropathy, unspecified: Secondary | ICD-10-CM

## 2016-10-08 MED ORDER — INSULIN PEN NEEDLE 31G X 5 MM MISC: 10 refills | Status: DC

## 2016-10-08 MED ORDER — DOXYCYCLINE HYCLATE 100 MG PO TABS: 100.0000 mg | ORAL_TABLET | Freq: Two times a day (BID) | ORAL | 0 refills | Status: DC

## 2016-10-08 MED ORDER — ERYTHROMYCIN 5 MG/GM OP OINT: 1.0000 "application " | TOPICAL_OINTMENT | Freq: Four times a day (QID) | OPHTHALMIC | 0 refills | Status: DC

## 2016-10-08 NOTE — Progress Notes (Signed)
Letter done

## 2016-10-08 NOTE — Progress Notes (Signed)
Pre visit review using our clinic review tool, if applicable. No additional management support is needed unless otherwise documented below in the visit note. 

## 2016-10-08 NOTE — Patient Instructions (Signed)
.  Please take all new medication as prescribed - the ointment antibiotic, and the pill antibiotic  Please continue all other medications as before, and refills have been done if requested.  Please have the pharmacy call with any other refills you may need.  Please continue your efforts at being more active, low cholesterol diet, and weight control.  Please keep your appointments with your specialists as you may have planned  You are given the work note from Sat Dec 9 to a return to work date of Dec 18

## 2016-10-08 NOTE — Progress Notes (Signed)
 Subjective:    Patient ID: Nicole Melendez, female    DOB: 10/04/1976, 40 y.o.   MRN: [id number]  HPI  Here to f/u with c/o 2-3 days onset right upper eyelid pain, tender, red, swelling without fever, vision changes or conjunctival involvement, has some pain to the right temple area as well without swelling or redness; No sinus pain, chills, ST, cough.  Pt denies chest pain, increased sob or doe, wheezing, orthopnea, PND, increased LE swelling, palpitations, dizziness or syncope.  Already off work since dec 9, needs several more days.   Pt denies polydipsia, polyuria, or low sugar symptoms .  Pt states overall good compliance with meds, trying to follow lower cholesterol, diabetic diet, no other new history Past Medical History:  Diagnosis Date  . Allergic rhinitis, cause unspecified   . Anemia   . Anxiety   . Depression   . Diabetes mellitus type II    steroid related, patient states "im no longer diabetic"  . Essential hypertension 08/08/2015  . GERD (gastroesophageal reflux disease)   . History of blood transfusion   . Hyperlipidemia   . Menorrhagia   . Migraine   . Morbid obesity ([facility])   . Otitis media 06/28/2015  . Peptic ulcer disease   . PNA (pneumonia) july 2011  . Positive ANA (antinuclear antibody) 02/14/2012  . Sarcoid ([facility])    including hand per rheumatology-Dr. Beekman  . 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 Shortness of breath    on exertion  . Varicose veins with pain    Past Surgical History:  Procedure Laterality Date  . ABDOMINAL HYSTERECTOMY    . uterine ablation  03/2010  . WISDOM TOOTH EXTRACTION      reports that she quit smoking about 3 years ago. Her smoking use included Cigarettes. She has a 20.00 pack-year smoking history. She has never used smokeless tobacco. She reports that she does not drink alcohol or use drugs. family history includes Allergies in her mother; Bone cancer in her maternal aunt; Breast cancer in her maternal aunt; Diabetes in her father and  mother; Heart attack in her mother; Heart disease in her father; Lung cancer in her maternal aunt; Ovarian cancer in her maternal aunt; Rheum arthritis in her father. Allergies  Allergen Reactions  . Azithromycin Hives    (z pak) hives  . Penicillins Shortness Of Breath and Swelling    Has patient had a PCN reaction causing immediate rash, facial/tongue/throat swelling, SOB or lightheadedness with hypotension: Yes Has patient had a PCN reaction causing severe rash involving mucus membranes or skin necrosis: No Has patient had a PCN reaction that required hospitalization No Has patient had a PCN reaction occurring within the last 10 years: No If all of the above answers are "NO", then may proceed with Cephalosporin use.  REACTION: swelling and difficulty breathing  . Shellfish Allergy Anaphylaxis  . Klonopin [Clonazepam] Other (See Comments)    Memory difficulty  . Bee Venom   . Flagyl [Metronidazole]    Current Outpatient Prescriptions on File Prior to Visit  Medication Sig Dispense Refill  . acetaminophen (TYLENOL) 500 MG tablet Take 1,000 mg by mouth every 6 (six) hours as needed for mild pain or moderate pain.    . ALPRAZolam (XANAX) 0.5 MG tablet Take 1 tablet (0.5 mg total) by mouth 3 (three) times daily as needed. 90 tablet 2  . azelastine (ASTELIN) 0.1 % nasal spray Place 2 sprays into both nostrils 2 (two) times daily. Use   in each nostril as directed 30 mL 12  . Blood Glucose Monitoring Suppl (ONE TOUCH ULTRA 2) W/DEVICE KIT Use as directed four times daily. Dx: E11.9 1 each 0  . budesonide-formoterol (SYMBICORT) 160-4.5 MCG/ACT inhaler Inhale 2 puffs into the lungs 2 (two) times daily. 1 Inhaler 5  . cetirizine (ZYRTEC) 10 MG tablet TAKE 1 TABLET(10 MG) BY MOUTH DAILY 90 tablet 0  . cholecalciferol (VITAMIN D) 1000 UNITS tablet Take 5,000 Units by mouth daily.    . cyclobenzaprine (FLEXERIL) 5 MG tablet Take 1 tablet (5 mg total) by mouth at bedtime as needed for muscle spasms.  30 tablet 3  . Diclofenac Sodium (PENNSAID) 2 % SOLN Place 2 application onto the skin 2 (two) times daily. 112 g 3  . diphenoxylate-atropine (LOMOTIL) 2.5-0.025 MG tablet Take 1 tablet by mouth 4 (four) times daily as needed for diarrhea or loose stools. 40 tablet 0  . folic acid (FOLVITE) 1 MG tablet TAKE 2 TABLETS BY MOUTH DAILY 180 tablet 3  . furosemide (LASIX) 40 MG tablet 1 tab by mouth every day in the AM and then 1 as needed only for persistent swelling in the PM 60 tablet 11  . gabapentin (NEURONTIN) 300 MG capsule TAKE 1 CAPSULE(300 MG) BY MOUTH THREE TIMES DAILY 90 capsule 0  . gabapentin (NEURONTIN) 300 MG capsule TAKE 1 CAPSULE(300 MG) BY MOUTH THREE TIMES DAILY 90 capsule 5  . glucose blood (ONE TOUCH ULTRA TEST) test strip Use to check blood sugars four times a day Dx E11.9 400 each 11  . HUMALOG 100 UNIT/ML injection INJECT UP TO 12 UNITS INTO THE SKIN THREE TIMES DAILY WITH MEALS WITH SLIDING SCALE AS DIRECTED 10 mL 0  . ibuprofen (ADVIL,MOTRIN) 600 MG tablet Take 1 tablet (600 mg total) by mouth every 6 (six) hours as needed. 30 tablet 0  . levalbuterol (XOPENEX HFA) 45 MCG/ACT inhaler Inhale 1-2 puffs into the lungs every 8 (eight) hours as needed for wheezing or shortness of breath. 1 Inhaler 5  . methotrexate (RHEUMATREX) 2.5 MG tablet Take 10 mg by mouth every Friday.     . naproxen (NAPROSYN) 500 MG tablet Take 1 tablet (500 mg total) by mouth 2 (two) times daily with a meal. 60 tablet 0  . naproxen (NAPROSYN) 500 MG tablet TAKE 1 TABLET BY MOUTH TWICE DAILY WITH A MEAL 60 tablet 0  . Naproxen-Esomeprazole (VIMOVO) 500-20 MG TBEC Take 1 tablet by mouth 2 (two) times daily as needed. 60 tablet 1  . nystatin (MYCOSTATIN) 100000 UNIT/ML suspension TAKE 5 ML BY MOUTH FOUR TIMES DAILY 60 mL 0  . ondansetron (ZOFRAN) 4 MG tablet Take 1 tablet (4 mg total) by mouth 4 (four) times daily as needed for nausea or vomiting. 50 tablet 0  . pantoprazole (PROTONIX) 40 MG tablet Take 1  tablet (40 mg total) by mouth daily. 90 tablet 3  . potassium chloride (K-DUR) 10 MEQ tablet 3 tab by mouth per day only when taking the lasix 90 tablet 11  . predniSONE (DELTASONE) 10 MG tablet 3 tabs by mouth per day for 3 days,2tabs per day for 3 days,1tab per day for 3 days 18 tablet 0  . sertraline (ZOLOFT) 100 MG tablet TAKE 2 TABLETS BY MOUTH DAILY 180 tablet 0  . SUMAtriptan (IMITREX) 100 MG tablet Take 1 tablet by mouth at the onset of a headache. May repeat in 2 hours if headache persists or recurs. 10 tablet 2  . telmisartan (MICARDIS) 40 MG  tablet Take 1 tablet (40 mg total) by mouth daily. 90 tablet 3  . tiZANidine (ZANAFLEX) 4 MG tablet Take 1 tablet (4 mg total) by mouth every 6 (six) hours as needed for muscle spasms. 40 tablet 0  . topiramate (TOPAMAX) 25 MG tablet Take 1 tablet by mouth daily for 1 week then increase to 1 tablet by mouth twice daily for 1 week then increase to 2 tablets by mouth in the evening and 1 tablet by mouth in the morning for 1 week then increase to 2 tablets by mouth daily. 60 tablet 0  . vitamin B-12 (CYANOCOBALAMIN) 1000 MCG tablet Take 1,000 mcg by mouth daily.    . Vitamin D, Ergocalciferol, (DRISDOL) 50000 units CAPS capsule Take 1 capsule (50,000 Units total) by mouth every 7 (seven) days. 12 capsule 0   No current facility-administered medications on file prior to visit.    Review of Systems  Constitutional: Negative for unusual diaphoresis or night sweats HENT: Negative for ear swelling or discharge Eyes: Negative for worsening visual haziness  Respiratory: Negative for choking and stridor.   Gastrointestinal: Negative for distension or worsening eructation Genitourinary: Negative for retention or change in urine volume.  Musculoskeletal: Negative for other MSK pain or swelling Skin: Negative for color change and worsening wound Neurological: Negative for tremors and numbness other than noted  Psychiatric/Behavioral: Negative for decreased  concentration or agitation other than above   All other system neg per pt    Objective:   Physical Exam BP 138/78   Pulse 100   Temp 98.3 F (36.8 C) (Oral)   Resp 20   Wt (!) 323 lb (146.5 kg)   LMP 07/18/2011   SpO2 94%   BMI 52.13 kg/m  VS noted,  Constitutional: Pt appears in no apparent distress HENT: Head: NCAT.  Right Ear: External ear normal.  Left Ear: External ear normal.  Eyes: . Pupils are equal, round, and reactive to light. Conjunctivae and EOM are normal Neck: Normal range of motion. Neck supple.  Cardiovascular: Normal rate and regular rhythm.   Pulmonary/Chest: Effort normal and breath sounds without rales or wheezing.  Neurological: Pt is alert. Not confused , motor grossly intact Skin: Skin is warm. No rash, no LE edema but does have 1-2+ right upper eyelid red/tender/swelling without ulcer, drainage Psychiatric: Pt behavior is normal. No agitation.     Assessment & Plan:   

## 2016-10-09 DIAGNOSIS — H01001 Unspecified blepharitis right upper eyelid: Secondary | ICD-10-CM | POA: Insufficient documentation

## 2016-10-09 NOTE — Assessment & Plan Note (Signed)
stable overall by history and exam, recent data reviewed with pt, and pt to continue medical treatment as before,  to f/u any worsening symptoms or concerns BP Readings from Last 3 Encounters:  10/08/16 138/78  10/06/16 130/84  09/15/16 136/70

## 2016-10-09 NOTE — Assessment & Plan Note (Signed)
stable overall by history and exam, recent data reviewed with pt, and pt to continue medical treatment as before,  to f/u any worsening symptoms or concerns Lab Results  Component Value Date   HGBA1C 6.3 06/16/2016    

## 2016-10-09 NOTE — Assessment & Plan Note (Signed)
Mild to mod, for antibx course,  to f/u any worsening symptoms or concerns 

## 2016-10-12 ENCOUNTER — Encounter (HOSPITAL_COMMUNITY): Payer: Self-pay

## 2016-10-12 NOTE — Telephone Encounter (Signed)
Unfortunately I do no know the exact size needles  OK to contact pt or phamacy to find out. thanks

## 2016-10-13 ENCOUNTER — Encounter (HOSPITAL_COMMUNITY): Payer: Self-pay

## 2016-10-13 ENCOUNTER — Encounter: Payer: Self-pay | Admitting: Neurology

## 2016-10-13 ENCOUNTER — Ambulatory Visit (INDEPENDENT_AMBULATORY_CARE_PROVIDER_SITE_OTHER): Payer: BLUE CROSS/BLUE SHIELD | Admitting: Neurology

## 2016-10-13 ENCOUNTER — Telehealth: Payer: Self-pay | Admitting: Internal Medicine

## 2016-10-13 VITALS — HR 106 | Ht 66.0 in | Wt 320.0 lb

## 2016-10-13 DIAGNOSIS — G43009 Migraine without aura, not intractable, without status migrainosus: Secondary | ICD-10-CM | POA: Diagnosis not present

## 2016-10-13 DIAGNOSIS — F17200 Nicotine dependence, unspecified, uncomplicated: Secondary | ICD-10-CM

## 2016-10-13 DIAGNOSIS — R519 Headache, unspecified: Secondary | ICD-10-CM

## 2016-10-13 DIAGNOSIS — R51 Headache: Secondary | ICD-10-CM | POA: Diagnosis not present

## 2016-10-13 MED ORDER — SUMATRIPTAN SUCCINATE 100 MG PO TABS: ORAL_TABLET | ORAL | 2 refills | Status: DC

## 2016-10-13 MED ORDER — "INSULIN SYRINGE-NEEDLE U-100 25G X 1"" 1 ML MISC": 5 refills | Status: DC

## 2016-10-13 MED ORDER — ZONISAMIDE 100 MG PO CAPS: 100.0000 mg | ORAL_CAPSULE | Freq: Every day | ORAL | 2 refills | Status: DC

## 2016-10-13 NOTE — Patient Instructions (Signed)
Migraine Recommendations: 1.  Start zonisamide 100mg  daily.  Call in 6 weeks with update and we can adjust dose if needed. 2.  Take sumatriptan at earliest onset of headache.  May repeat dose once after 2 hours if needed.  Do not exceed two tablets in 24 hours. 3.  Limit use of pain relievers to no more than 2 days out of the week.  These medications include acetaminophen, ibuprofen, triptans and narcotics.  This will help reduce risk of rebound headaches. 4.  Be aware of common food triggers such as processed sweets, processed foods with nitrites (such as deli meat, hot dogs, sausages), foods with MSG, alcohol (such as wine), chocolate, certain cheeses, certain fruits (dried fruits, some citrus fruit), vinegar, diet soda. 4.  Avoid caffeine 5.  Routine exercise 6.  Proper sleep hygiene 7.  Stay adequately hydrated with water 8.  Keep a headache diary. 9.  Maintain proper stress management. 10.  Do not skip meals. 11.  Consider supplements:  Magnesium citrate 400mg  to 600mg  daily, riboflavin 400mg , Coenzyme Q 10 100mg  three times daily 12.  Discuss with Terri Piedra about switching from Zoloft to Effexor (it helps with migraines as well as depression and anxiety) 13.  Diet, weight loss 14.  Follow up in 3 months.

## 2016-10-13 NOTE — Progress Notes (Signed)
NEUROLOGY CONSULTATION NOTE  Nicole Melendez MRN: [id number] DOB: 1976-08-29  Referring provider: Mauricio Po, FNP Primary care provider: Cathlean Cower, MD  Reason for consult:  migraine  HISTORY OF PRESENT ILLNESS: Nicole Melendez is a 40 year old right-handed woman with diabetes with neuropathy, GERD, PUD, migraine, sarcoid, hyperlipidemia, and HTN who presents for headache.  History obtained by patient and PCP note.  Onset:  Mid-30s Location:  Back of head, right sided Quality:  pounding Intensity:  8-9/10 Aura:  no Prodrome:  no Associated symptoms:  Nausea, vomiting, osmophobia, right eye blurred vision, sometimes photophobia Duration:  2 days (within one hour with sumatriptan) Frequency:  Varies.  Some months 1-2 days, some months 10 days Triggers/exacerbating factors:  no Relieving factors:  no Activity:  Cannot function 1 to 2 days per month  Past NSAIDS:  ibuprofen Past analgesics:  Percocet, tramadol (for back pain) Past abortive triptans:  no Past muscle relaxants:  tizanidine 52m Past anti-emetic:  Phenergan, Zofran (effective) Past antihypertensive medications:  no Past antidepressant medications:  no Past anticonvulsant medications:  topiramate (caused nausea and vomiting) Past vitamins/Herbal/Supplements:  no Other past therapies:  meditation  Current NSAIDS:  naprpoxen 5048m(for back pain) Current analgesics:  Tylenol  Current triptans:  sumatriptan 10063meffective) Current anti-emetic:  Zofran 4mg30mrrent muscle relaxants:  Flexeril (back pain) Current anti-anxiolytic:  alprazolam Current sleep aide:  no Current Antihypertensive medications:  Lasix Current Antidepressant medications:  sertraline 200mg33mrent Anticonvulsant medications:  gabapentin 300mg 73me times daily Current Vitamins/Herbal/Supplements:  folic acid, D3 Current Antihistamines/Decongestants:  Zyrtec Other therapy:  no Other medication:  methotrexate  Caffeine:  no Alcohol:   no Smoker:  Started smoking last month (relapse) Diet:  Hydrates, baked chicken, steamed vegetables Exercise:  yes Depression/stress:  Increased stress Sleep hygiene:  varies Family history of headache:  mother  06/16/16:  BMP with Na 136, K 3.3, BUN 7, Cr 0.73; Hepatic panel with Total bili 0.3, ALP 149, AST 16, ALT 13.  PAST MEDICAL HISTORY: Past Medical History:  Diagnosis Date  . Allergic rhinitis, cause unspecified   . Anemia   . Anxiety   . Depression   . Diabetes mellitus type II    steroid related, patient states "im no longer diabetic"  . Essential hypertension 08/08/2015  . GERD (gastroesophageal reflux disease)   . History of blood transfusion   . Hyperlipidemia   . Menorrhagia   . Migraine   . Morbid obesity (HCC)  [place]itis media 06/28/2015  . Peptic ulcer disease   . PNA (pneumonia) july 2011  . Positive ANA (antinuclear antibody) 02/14/2012  . Sarcoid (HCC)  [place]-Dr. BeekmaAmil Amenrcoidosis of lung (HCC)  [place]ness of breath    on exertion  . Varicose veins with pain     PAST SURGICAL HISTORY: Past Surgical History:  Procedure Laterality Date  . ABDOMINAL HYSTERECTOMY    . uterine ablation  03/2010  . WISDOM TOOTH EXTRACTION      MEDICATIONS: Current Outpatient Prescriptions on File Prior to Visit  Medication Sig Dispense Refill  . acetaminophen (TYLENOL) 500 MG tablet Take 1,000 mg by mouth every 6 (six) hours as needed for mild pain or moderate pain.    . ALPRMarland KitchenZolam (XANAX) 0.5 MG tablet Take 1 tablet (0.5 mg total) by mouth 3 (three) times daily as needed. 90 tablet 2  . azelastine (ASTELIN) 0.1 % nasal spray Place 2 sprays into both nostrils 2 (two)  times daily. Use in each nostril as directed 30 mL 12  . Blood Glucose Monitoring Suppl (ONE TOUCH ULTRA 2) W/DEVICE KIT Use as directed four times daily. Dx: E11.9 1 each 0  . budesonide-formoterol (SYMBICORT) 160-4.5 MCG/ACT inhaler Inhale 2 puffs into the lungs 2 (two)  times daily. 1 Inhaler 5  . cetirizine (ZYRTEC) 10 MG tablet TAKE 1 TABLET(10 MG) BY MOUTH DAILY 90 tablet 0  . cholecalciferol (VITAMIN D) 1000 UNITS tablet Take 5,000 Units by mouth daily.    . cyclobenzaprine (FLEXERIL) 5 MG tablet Take 1 tablet (5 mg total) by mouth at bedtime as needed for muscle spasms. 30 tablet 3  . Diclofenac Sodium (PENNSAID) 2 % SOLN Place 2 application onto the skin 2 (two) times daily. 112 g 3  . diphenoxylate-atropine (LOMOTIL) 2.5-0.025 MG tablet Take 1 tablet by mouth 4 (four) times daily as needed for diarrhea or loose stools. 40 tablet 0  . doxycycline (VIBRA-TABS) 100 MG tablet Take 1 tablet (100 mg total) by mouth 2 (two) times daily. 20 tablet 0  . erythromycin ophthalmic ointment Place 1 application into the right eye 4 (four) times daily. 3.5 g 0  . folic acid (FOLVITE) 1 MG tablet TAKE 2 TABLETS BY MOUTH DAILY 180 tablet 3  . furosemide (LASIX) 40 MG tablet 1 tab by mouth every day in the AM and then 1 as needed only for persistent swelling in the PM 60 tablet 11  . gabapentin (NEURONTIN) 300 MG capsule TAKE 1 CAPSULE(300 MG) BY MOUTH THREE TIMES DAILY 90 capsule 5  . glucose blood (ONE TOUCH ULTRA TEST) test strip Use to check blood sugars four times a day Dx E11.9 400 each 11  . HUMALOG 100 UNIT/ML injection INJECT UP TO 12 UNITS INTO THE SKIN THREE TIMES DAILY WITH MEALS WITH SLIDING SCALE AS DIRECTED 10 mL 0  . ibuprofen (ADVIL,MOTRIN) 600 MG tablet Take 1 tablet (600 mg total) by mouth every 6 (six) hours as needed. 30 tablet 0  . Insulin Pen Needle (B-D UF III MINI PEN NEEDLES) 31G X 5 MM MISC USE FOUR TIMES DAILY FOR SLIDING SCALE AS NEEDED 200 each 10  . levalbuterol (XOPENEX HFA) 45 MCG/ACT inhaler Inhale 1-2 puffs into the lungs every 8 (eight) hours as needed for wheezing or shortness of breath. 1 Inhaler 5  . methotrexate (RHEUMATREX) 2.5 MG tablet Take 10 mg by mouth every Friday.     . naproxen (NAPROSYN) 500 MG tablet Take 1 tablet (500 mg  total) by mouth 2 (two) times daily with a meal. 60 tablet 0  . naproxen (NAPROSYN) 500 MG tablet TAKE 1 TABLET BY MOUTH TWICE DAILY WITH A MEAL 60 tablet 0  . Naproxen-Esomeprazole (VIMOVO) 500-20 MG TBEC Take 1 tablet by mouth 2 (two) times daily as needed. 60 tablet 1  . pantoprazole (PROTONIX) 40 MG tablet Take 1 tablet (40 mg total) by mouth daily. 90 tablet 3  . potassium chloride (K-DUR) 10 MEQ tablet 3 tab by mouth per day only when taking the lasix 90 tablet 11  . sertraline (ZOLOFT) 100 MG tablet TAKE 2 TABLETS BY MOUTH DAILY 180 tablet 0  . SUMAtriptan (IMITREX) 100 MG tablet Take 1 tablet by mouth at the onset of a headache. May repeat in 2 hours if headache persists or recurs. 10 tablet 2  . telmisartan (MICARDIS) 40 MG tablet Take 1 tablet (40 mg total) by mouth daily. 90 tablet 3  . vitamin B-12 (CYANOCOBALAMIN) 1000 MCG tablet Take  1,000 mcg by mouth daily.    . Vitamin D, Ergocalciferol, (DRISDOL) 50000 units CAPS capsule Take 1 capsule (50,000 Units total) by mouth every 7 (seven) days. 12 capsule 0   No current facility-administered medications on file prior to visit.     ALLERGIES: Allergies  Allergen Reactions  . Azithromycin Hives    (z pak) hives  . Penicillins Shortness Of Breath and Swelling    Has patient had a PCN reaction causing immediate rash, facial/tongue/throat swelling, SOB or lightheadedness with hypotension: Yes Has patient had a PCN reaction causing severe rash involving mucus membranes or skin necrosis: No Has patient had a PCN reaction that required hospitalization No Has patient had a PCN reaction occurring within the last 10 years: No If all of the above answers are "NO", then may proceed with Cephalosporin use.  REACTION: swelling and difficulty breathing  . Shellfish Allergy Anaphylaxis  . Klonopin [Clonazepam] Other (See Comments)    Memory difficulty  . Bee Venom   . Flagyl [Metronidazole]     FAMILY HISTORY: Family History  Problem  Relation Age of Onset  . Allergies Mother   . Heart attack Mother   . Diabetes Mother   . Diabetes Father   . Heart disease Father   . Rheum arthritis Father   . Stroke Father   . Hypertension    . Ovarian cancer Maternal Aunt   . Lung cancer Maternal Aunt   . Breast cancer Maternal Aunt   . Bone cancer Maternal Aunt   . Stroke Maternal Uncle   . Other Neg Hx     SOCIAL HISTORY: Social History   Social History  . Marital status: Single    Spouse name: N/A  . Number of children: 0  . Years of education: N/A   Occupational History  . personal shopper    Social History Main Topics  . Smoking status: Former Smoker    Packs/day: 1.00    Years: 20.00    Types: Cigarettes    Quit date: 10/25/2012  . Smokeless tobacco: Never Used     Comment: QUIT 04/2010 AND STARTED BACK 2014 X 3 MONTHS. less than 1 ppd.  started at age 39.    Marland Kitchen Alcohol use No  . Drug use: No  . Sexual activity: Yes    Birth control/ protection: None   Other Topics Concern  . Not on file   Social History Narrative   Pt lives with [name]- also pt of [facility]    REVIEW OF SYSTEMS: Constitutional: No fevers, chills, or sweats, no generalized fatigue, change in appetite Eyes: No visual changes, double vision, eye pain Ear, nose and throat: No hearing loss, ear pain, nasal congestion, sore throat Cardiovascular: No chest pain, palpitations Respiratory:  No shortness of breath at rest or with exertion, wheezes GastrointestinaI: No nausea, vomiting, diarrhea, abdominal pain, fecal incontinence Genitourinary:  No dysuria, urinary retention or frequency Musculoskeletal:  No neck pain, back pain Integumentary: No rash, pruritus, skin lesions Neurological: as above Psychiatric: No depression, insomnia, anxiety Endocrine: No palpitations, fatigue, diaphoresis, mood swings, change in appetite, change in weight, increased thirst Hematologic/Lymphatic:  No purpura, petechiae. Allergic/Immunologic: no  itchy/runny eyes, nasal congestion, recent allergic reactions, rashes  PHYSICAL EXAM: Vitals:   10/13/16 0819  Pulse: (!) 106   General: No acute distress.  Patient appears well-groomed.  Head:  Normocephalic/atraumatic Eyes:  fundi examined but not visualized Neck: supple, no paraspinal tenderness, full range of motion Back: No paraspinal tenderness Heart:  regular rate and rhythm Lungs: Clear to auscultation bilaterally. Vascular: No carotid bruits. Neurological Exam: Mental status: alert and oriented to person, place, and time, recent and remote memory intact, fund of knowledge intact, attention and concentration intact, speech fluent and not dysarthric, language intact. Cranial nerves: CN I: not tested CN II: pupils equal, round and reactive to light, visual fields intact CN III, IV, VI:  full range of motion, no nystagmus, no ptosis CN V: facial sensation intact CN VII: upper and lower face symmetric CN VIII: hearing intact CN IX, X: gag intact, uvula midline CN XI: sternocleidomastoid and trapezius muscles intact CN XII: tongue midline Bulk & Tone: normal, no fasciculations. Motor:  5/5 throughout  Sensation: temperature and vibration sensation intact. Deep Tendon Reflexes:  2+ throughout, toes downgoing.  Finger to nose testing:  Without dysmetria.  Heel to shin:  Without dysmetria.  Gait:  Normal station and stride.  Able to turn and tandem walk. Romberg negative.  IMPRESSION: Migraine Morbid obesity Tobacco use  PLAN: 1.  Will start zonisamide 167m daily.  Side effects discussed. Instructed to contact [place] 6 weeks with update. 2.  Refilled sumatriptan 1065mas it is effective 3.  Lifestyle modification:  Weight loss, smoking cessation 4.  She takes sertraline 20013mor depression but feels she needs a change as she has restarted smoking and feels anxious.  I asked her to discuss switching to venlafaxine XR as it is effective in treating migraines as well as  depression and anxiety. 5.  Follow up in 3 months.  Thank you for allowing me to take part in the care of this patient.  AdaMetta ClinesO  CC: JamCathlean CowerD  GreMauricio PoNP

## 2016-10-13 NOTE — Telephone Encounter (Signed)
Patient called to advise that Insulin Pen Needle (B-D UF III MINI PEN NEEDLES) 31G X 5 MM MISC [id number]  Were wrong altogether. She needs you to send in the syringes that allow her to draw 12cc. I was unable to locate on the med list, but she states that it was >76mo ago when we last prescribed.

## 2016-10-13 NOTE — Telephone Encounter (Signed)
Tried calling pt to inform rx has been sent to [facility]. Someone kept picking up but not saying anything, but syringes has been sent to [facility]...Nicole Melendez

## 2016-10-15 ENCOUNTER — Telehealth: Payer: Self-pay | Admitting: Emergency Medicine

## 2016-10-15 NOTE — Telephone Encounter (Signed)
I would know the answer this quesiton.  The exact needle type would need to be determined by asking the pt her previous, or pharmacy for a recommendation for appropriate needle for her particular inslulin pen

## 2016-10-15 NOTE — Telephone Encounter (Signed)
im not sure which ones she is referring to. I have sent some in and they were not correct. Please advise

## 2016-10-15 NOTE — Telephone Encounter (Signed)
Pt called and needs her insulin syringes that she can draw the 12cc's with. These are different than the one that were called in on 10/13/16. Please advise thanks.

## 2016-10-18 NOTE — Anesthesia Preprocedure Evaluation (Addendum)
Anesthesia Evaluation  Patient identified by MRN, date of birth, ID band Patient awake    Reviewed: Allergy & Precautions, NPO status , Patient's Chart, lab work & pertinent test results  Airway Mallampati: III  TM Distance: >3 FB Neck ROM: Full    Dental no notable dental hx. (+) Dental Advisory Given   Pulmonary neg pulmonary ROS, former smoker,    Pulmonary exam normal        Cardiovascular hypertension, + Peripheral Vascular Disease  negative cardio ROS Normal cardiovascular exam     Neuro/Psych  Headaches, PSYCHIATRIC DISORDERS Anxiety Depression negative neurological ROS     GI/Hepatic Neg liver ROS, PUD, GERD  ,  Endo/Other  diabetesMorbid obesity  Renal/GU negative Renal ROS     Musculoskeletal negative musculoskeletal ROS (+)   Abdominal   Peds  Hematology negative hematology ROS (+)   Anesthesia Other Findings Day of surgery medications reviewed with the patient.  Reproductive/Obstetrics                            Anesthesia Physical Anesthesia Plan  ASA: III  Anesthesia Plan: MAC   Post-op Pain Management:    Induction:   Airway Management Planned: Natural Airway  Additional Equipment:   Intra-op Plan:   Post-operative Plan:   Informed Consent: I have reviewed the patients History and Physical, chart, labs and discussed the procedure including the risks, benefits and alternatives for the proposed anesthesia with the patient or authorized representative who has indicated his/her understanding and acceptance.   Dental advisory given  Plan Discussed with: CRNA and Anesthesiologist  Anesthesia Plan Comments:        Anesthesia Quick Evaluation

## 2016-10-19 ENCOUNTER — Encounter (HOSPITAL_COMMUNITY): Payer: Self-pay

## 2016-10-19 ENCOUNTER — Ambulatory Visit (HOSPITAL_COMMUNITY)
Admission: RE | Admit: 2016-10-19 | Discharge: 2016-10-19 | Disposition: A | Discharge status: Home / Self Care | Payer: BLUE CROSS/BLUE SHIELD | Source: Ambulatory Visit | Attending: Gastroenterology | Admitting: Gastroenterology

## 2016-10-19 ENCOUNTER — Encounter (HOSPITAL_COMMUNITY)
Admission: RE | Disposition: A | Discharge status: Home / Self Care | Payer: Self-pay | Source: Ambulatory Visit | Attending: Gastroenterology

## 2016-10-19 ENCOUNTER — Ambulatory Visit (HOSPITAL_COMMUNITY): Payer: BLUE CROSS/BLUE SHIELD | Admitting: Anesthesiology

## 2016-10-19 DIAGNOSIS — I739 Peripheral vascular disease, unspecified: Secondary | ICD-10-CM | POA: Insufficient documentation

## 2016-10-19 DIAGNOSIS — K2970 Gastritis, unspecified, without bleeding: Secondary | ICD-10-CM | POA: Diagnosis not present

## 2016-10-19 DIAGNOSIS — E119 Type 2 diabetes mellitus without complications: Secondary | ICD-10-CM | POA: Insufficient documentation

## 2016-10-19 DIAGNOSIS — I10 Essential (primary) hypertension: Secondary | ICD-10-CM | POA: Diagnosis not present

## 2016-10-19 DIAGNOSIS — K259 Gastric ulcer, unspecified as acute or chronic, without hemorrhage or perforation: Secondary | ICD-10-CM | POA: Diagnosis not present

## 2016-10-19 DIAGNOSIS — K2211 Ulcer of esophagus with bleeding: Secondary | ICD-10-CM | POA: Insufficient documentation

## 2016-10-19 DIAGNOSIS — Z87891 Personal history of nicotine dependence: Secondary | ICD-10-CM | POA: Insufficient documentation

## 2016-10-19 DIAGNOSIS — K2990 Gastroduodenitis, unspecified, without bleeding: Secondary | ICD-10-CM

## 2016-10-19 DIAGNOSIS — D509 Iron deficiency anemia, unspecified: Secondary | ICD-10-CM | POA: Insufficient documentation

## 2016-10-19 DIAGNOSIS — K648 Other hemorrhoids: Secondary | ICD-10-CM

## 2016-10-19 DIAGNOSIS — D649 Anemia, unspecified: Secondary | ICD-10-CM | POA: Diagnosis not present

## 2016-10-19 DIAGNOSIS — K319 Disease of stomach and duodenum, unspecified: Secondary | ICD-10-CM | POA: Diagnosis not present

## 2016-10-19 DIAGNOSIS — K449 Diaphragmatic hernia without obstruction or gangrene: Secondary | ICD-10-CM | POA: Insufficient documentation

## 2016-10-19 DIAGNOSIS — K219 Gastro-esophageal reflux disease without esophagitis: Secondary | ICD-10-CM | POA: Diagnosis not present

## 2016-10-19 DIAGNOSIS — K649 Unspecified hemorrhoids: Secondary | ICD-10-CM | POA: Diagnosis not present

## 2016-10-19 DIAGNOSIS — Z6841 Body Mass Index (BMI) 40.0 and over, adult: Secondary | ICD-10-CM | POA: Insufficient documentation

## 2016-10-19 HISTORY — PX: ESOPHAGOGASTRODUODENOSCOPY (EGD) WITH PROPOFOL: SHX5813

## 2016-10-19 HISTORY — PX: COLONOSCOPY WITH PROPOFOL: SHX5780

## 2016-10-19 LAB — GLUCOSE, CAPILLARY: Glucose-Capillary: 87 mg/dL (ref 65–99)

## 2016-10-19 SURGERY — COLONOSCOPY WITH PROPOFOL
Anesthesia: Monitor Anesthesia Care

## 2016-10-19 MED ORDER — LACTATED RINGERS IV SOLN
INTRAVENOUS | Status: DC | PRN
  Administered 2016-10-19: 08:00:00 via INTRAVENOUS

## 2016-10-19 MED ORDER — PROPOFOL 10 MG/ML IV BOLUS
INTRAVENOUS | Status: AC
  Filled 2016-10-19: qty 20

## 2016-10-19 MED ORDER — PROPOFOL 500 MG/50ML IV EMUL
INTRAVENOUS | Status: DC | PRN
  Administered 2016-10-19: 100 ug/kg/min via INTRAVENOUS

## 2016-10-19 MED ORDER — PANTOPRAZOLE SODIUM 40 MG PO TBEC: 40.0000 mg | DELAYED_RELEASE_TABLET | Freq: Two times a day (BID) | ORAL | 3 refills | Status: DC

## 2016-10-19 MED ORDER — SODIUM CHLORIDE 0.9 % IV SOLN: INTRAVENOUS | Status: DC

## 2016-10-19 MED ORDER — HYDROMORPHONE HCL 1 MG/ML IJ SOLN: 0.2500 mg | INTRAMUSCULAR | Status: DC | PRN

## 2016-10-19 MED ORDER — PROPOFOL 10 MG/ML IV BOLUS
INTRAVENOUS | Status: AC
  Filled 2016-10-19: qty 40

## 2016-10-19 MED ORDER — PROMETHAZINE HCL 25 MG/ML IJ SOLN: 6.2500 mg | INTRAMUSCULAR | Status: DC | PRN

## 2016-10-19 MED ORDER — PROPOFOL 500 MG/50ML IV EMUL
INTRAVENOUS | Status: DC | PRN
  Administered 2016-10-19: 20 mg via INTRAVENOUS
  Administered 2016-10-19: 50 mg via INTRAVENOUS
  Administered 2016-10-19: 20 mg via INTRAVENOUS

## 2016-10-19 SURGICAL SUPPLY — 24 items

## 2016-10-19 NOTE — Anesthesia Postprocedure Evaluation (Signed)
Anesthesia Post Note  Patient: HEYLI BENISH  Procedure(s) Performed: Procedure(s) (LRB): COLONOSCOPY WITH PROPOFOL (N/A) ESOPHAGOGASTRODUODENOSCOPY (EGD) WITH PROPOFOL (N/A)  Patient location during evaluation: PACU Anesthesia Type: MAC Level of consciousness: awake and alert Pain management: pain level controlled Vital Signs Assessment: post-procedure vital signs reviewed and stable Respiratory status: spontaneous breathing and respiratory function stable Cardiovascular status: stable Anesthetic complications: no       Last Vitals:  Vitals:   10/19/16 0827  BP: (!) 151/88  Pulse: 82  Resp: 14  Temp: 36.5 C    Last Pain:  Vitals:   10/19/16 0827  TempSrc: Oral                 Lori-Ann Lindfors DANIEL

## 2016-10-19 NOTE — Transfer of Care (Signed)
Immediate Anesthesia Transfer of Care Note  Patient: Nicole Melendez  Procedure(s) Performed: Procedure(s): COLONOSCOPY WITH PROPOFOL (N/A) ESOPHAGOGASTRODUODENOSCOPY (EGD) WITH PROPOFOL (N/A)  Patient Location: PACU  Anesthesia Type:MAC  Level of Consciousness: awake, alert  and oriented  Airway & Oxygen Therapy: Patient Spontanous Breathing and Patient connected to nasal cannula oxygen  Post-op Assessment: Report given to RN and Post -op Vital signs reviewed and stable  Post vital signs: Reviewed and stable  Last Vitals:  Vitals:   10/19/16 0827  BP: (!) 151/88  Pulse: 82  Resp: 14  Temp: 36.5 C    Last Pain:  Vitals:   10/19/16 0827  TempSrc: Oral         Complications: No apparent anesthesia complications

## 2016-10-19 NOTE — H&P (Addendum)
[facility] History and Physical   Primary Care Physician:  Cathlean Cower, MD   Reason for Procedure:   Iron deficiency anemia, dysphagia, family h/o colon cancer (2 maternal aunts and 1 cousin on maternal side)  Plan:    EGD and Colonoscopy with possible intervention    HPI: Nicole Melendez is a 40 y.o. female with multiple co-morbidities here for evaluation of iron deficiency anemia, intermittent solid dysphagia. She is s/p partial hysterectomy.    Past Medical History:  Diagnosis Date  . Allergic rhinitis, cause unspecified   . Anemia   . Anxiety   . Depression   . Diabetes mellitus type II    steroid related, patient states "im no longer diabetic"  . Essential hypertension 08/08/2015  . GERD (gastroesophageal reflux disease)   . History of blood transfusion   . Hyperlipidemia   . Menorrhagia   . Migraine   . Morbid obesity ([place])   . Otitis media 06/28/2015  . Peptic ulcer disease   . Positive ANA (antinuclear antibody) 02/14/2012  . Sarcoid ([place])    including hand per rheumatology-Dr. Amil Amen  . Sarcoidosis of lung ([place])   . Shortness of breath    on exertion  . Varicose veins with pain     Past Surgical History:  Procedure Laterality Date  . ABDOMINAL HYSTERECTOMY    . HERNIA MESH REMOVAL  02/2013  . uterine ablation  03/2010  . WISDOM TOOTH EXTRACTION      Prior to Admission medications   Medication Sig Start Date End Date Taking? Authorizing Provider  acetaminophen (TYLENOL) 500 MG tablet Take 1,000 mg by mouth every 6 (six) hours as needed for mild pain or moderate pain.   Yes Historical Provider, MD  ALPRAZolam Duanne Moron) 0.5 MG tablet Take 1 tablet (0.5 mg total) by mouth 3 (three) times daily as needed. Patient taking differently: Take 0.5 mg by mouth 3 (three) times daily as needed for anxiety.  09/01/16  Yes Biagio Borg, MD  azelastine (ASTELIN) 0.1 % nasal spray Place 2 sprays into both nostrils 2 (two) times daily. Use in each nostril as  directed 03/11/16  Yes Biagio Borg, MD  budesonide-formoterol [facility]) 160-4.5 MCG/ACT inhaler Inhale 2 puffs into the lungs 2 (two) times daily. 06/30/16  Yes Collene Gobble, MD  cetirizine (ZYRTEC) 10 MG tablet TAKE 1 TABLET(10 MG) BY MOUTH DAILY 08/30/16  Yes Biagio Borg, MD  cyclobenzaprine (FLEXERIL) 5 MG tablet Take 1 tablet (5 mg total) by mouth at bedtime as needed for muscle spasms. Patient taking differently: Take 10 mg by mouth at bedtime as needed for muscle spasms.  10/17/14  Yes Biagio Borg, MD  Diclofenac Sodium (PENNSAID) 2 % SOLN Place 2 application onto the skin 2 (two) times daily. 10/06/16  Yes Lyndal Pulley, DO  doxycycline (VIBRA-TABS) 100 MG tablet Take 1 tablet (100 mg total) by mouth 2 (two) times daily. 10/08/16  Yes Biagio Borg, MD  erythromycin ophthalmic ointment Place 1 application into the right eye 4 (four) times daily. 10/08/16  Yes Biagio Borg, MD  folic acid (FOLVITE) 1 MG tablet TAKE 2 TABLETS BY MOUTH DAILY 06/30/16  Yes Collene Gobble, MD  furosemide (LASIX) 40 MG tablet 1 tab by mouth every day in the AM and then 1 as needed only for persistent swelling in the PM 06/16/16  Yes Biagio Borg, MD  gabapentin (NEURONTIN) 300 MG capsule TAKE 1 CAPSULE(300 MG) BY MOUTH THREE TIMES DAILY  10/05/16  Yes Biagio Borg, MD  HUMALOG 100 UNIT/ML injection INJECT UP TO 12 UNITS INTO THE SKIN THREE TIMES DAILY WITH MEALS WITH SLIDING SCALE AS DIRECTED Patient taking differently: Inject 2 to 12 units 3 times a day as needed for high blood sugar 10/01/16  Yes Biagio Borg, MD  levalbuterol [facility]) 45 MCG/ACT inhaler Inhale 1-2 puffs into the lungs every 8 (eight) hours as needed for wheezing or shortness of breath. 06/30/16  Yes Collene Gobble, MD  methotrexate (RHEUMATREX) 2.5 MG tablet Take 15 mg by mouth every Tuesday.    Yes Historical Provider, MD  naproxen (NAPROSYN) 500 MG tablet Take 1 tablet (500 mg total) by mouth 2 (two) times daily with a meal. Patient taking  differently: Take 500 mg by mouth daily.  12/09/15  Yes Golden Circle, FNP  pantoprazole (PROTONIX) 40 MG tablet Take 1 tablet (40 mg total) by mouth daily. 02/24/16  Yes Biagio Borg, MD  potassium chloride (K-DUR) 10 MEQ tablet 3 tab by mouth per day only when taking the lasix Patient taking differently: Take 10-20 mEq by mouth 2 (two) times daily. Take 10 meq in the morning and 2mq at night 06/17/16  Yes JBiagio Borg MD  sertraline (ZOLOFT) 100 MG tablet TAKE 2 TABLETS BY MOUTH DAILY 10/01/16  Yes JBiagio Borg MD  SUMAtriptan (IMITREX) 100 MG tablet Take 1 tablet by mouth at the onset of a headache. May repeat in 2 hours if headache persists or recurs. 10/13/16  Yes Adam RTelford Nab DO  telmisartan (MICARDIS) 40 MG tablet Take 1 tablet (40 mg total) by mouth daily. 03/11/16  Yes JBiagio Borg MD  TURMERIC PO Take 1 tablet by mouth daily.   Yes Historical Provider, MD  vitamin B-12 (CYANOCOBALAMIN) 1000 MCG tablet Take 1,000 mcg by mouth daily.   Yes Historical Provider, MD  Vitamin D, Ergocalciferol, (DRISDOL) 50000 units CAPS capsule Take 1 capsule (50,000 Units total) by mouth every 7 (seven) days. 10/06/16  Yes ZLyndal Pulley DO  zonisamide (ZONEGRAN) 100 MG capsule Take 1 capsule (100 mg total) by mouth daily. 10/13/16  Yes Adam RTelford Nab DO  Blood Glucose Monitoring Suppl (ONE TOUCH ULTRA 2) W/DEVICE KIT Use as directed four times daily. Dx: E11.9 08/11/15   SRenato Shin MD  glucose blood (ONE TOUCH ULTRA TEST) test strip Use to check blood sugars four times a day Dx E11.9 08/11/15   SRenato Shin MD  Insulin Syringe-Needle U-100 25G X 1" 1 ML MISC Use to administer humalog insulin three times a day Dx E11.9 10/13/16   JBiagio Borg MD  Insulin Syringes, Disposable, U-100 1 ML MISC by Does not apply route. Use to administer insulin three times a day. Dx E11.9    Historical Provider, MD  Naproxen-Esomeprazole (VIMOVO) 500-20 MG TBEC Take 1 tablet by mouth 2 (two) times daily as needed. Patient  not taking: Reported on 10/14/2016 07/19/16   JBiagio Borg MD    No current facility-administered medications for this encounter.     Allergies as of 09/08/2016 - Review Complete 09/08/2016  Allergen Reaction Noted  . Azithromycin Hives 01/22/2011  . Penicillins Shortness Of Breath and Swelling   . Shellfish allergy Anaphylaxis 10/04/2015  . Klonopin [clonazepam] Other (See Comments) 02/15/2014  . Bee venom  10/04/2015  . Flagyl [metronidazole]  11/21/2015    Family History  Problem Relation Age of Onset  . Allergies Mother   . Heart attack Mother   .  Diabetes Mother   . Diabetes Father   . Heart disease Father   . Rheum arthritis Father   . Stroke Father   . Hypertension    . Ovarian cancer Maternal Aunt   . Lung cancer Maternal Aunt   . Breast cancer Maternal Aunt   . Bone cancer Maternal Aunt   . Stroke Maternal Uncle   . Other Neg Hx     Social History   Social History  . Marital status: Single    Spouse name: N/A  . Number of children: 0  . Years of education: N/A   Occupational History  . personal shopper    Social History Main Topics  . Smoking status: Former Smoker    Packs/day: 1.00    Years: 20.00    Types: Cigarettes    Quit date: 10/25/2012  . Smokeless tobacco: Never Used     Comment: QUIT 04/2010 AND STARTED BACK 2014 X 3 MONTHS. less than 1 ppd.  started at age 57.    Marland Kitchen Alcohol use No  . Drug use: No  . Sexual activity: Yes    Birth control/ protection: None   Other Topics Concern  . Not on file   Social History Narrative   Pt lives with [name]- also pt of [facility]    Review of Systems: All other review of systems negative except as mentioned in the HPI.  Physical Exam: Vital signs in last 24 hours:   BP (!) 151/88   Pulse 82   Temp 97.7 F (36.5 C) (Oral)   Resp 14   Ht _0  (1.676 m)   Wt (!) 322 lb (146.1 kg)   LMP 07/18/2011   SpO2 100%   BMI 51.97 kg/m    General:   Alert,  Well-developed, well-nourished, pleasant  and cooperative in NAD, morbidly obese Lungs:  Clear throughout to auscultation.   Heart:  Regular rate and rhythm; no murmurs, clicks, rubs,  or gallops. Abdomen:  Soft, nontender and nondistended. Normal bowel sounds.   Neuro/Psych:  Alert and cooperative. Normal mood and affect. A and O x 3   _1 .Denzil Magnuson, MD [phone] Mon-Fri 8a-5p [phone] after 5p, weekends, holidays 10/19/2016 8:11 AM@

## 2016-10-19 NOTE — Op Note (Addendum)
[facility] Patient Name: Nicole Melendez Procedure Date: 10/19/2016 MRN: [id number] Attending MD: Mauri Pole , MD Date of Birth: 03/09/1976 CSN: [id number] Age: 40 Admit Type: Outpatient Procedure:                Upper GI endoscopy Indications:              Suspected upper gastrointestinal bleeding in                            patient with unexplained iron deficiency anemia,                            Dysphagia Providers:                Mauri Pole, MD, Zenon Mayo, RN, Rosario Adie, CRNA, Elspeth Cho Tech., Technician Referring MD:              Medicines:                Monitored Anesthesia Care Complications:            No immediate complications. Estimated Blood Loss:     Estimated blood loss was minimal. Procedure:                Pre-Anesthesia Assessment:                           - Prior to the procedure, a History and Physical                            was performed, and patient medications and                            allergies were reviewed. The patient's tolerance of                            previous anesthesia was also reviewed. The risks                            and benefits of the procedure and the sedation                            options and risks were discussed with the patient.                            All questions were answered, and informed consent                            was obtained. Prior Anticoagulants: The patient has                            taken no previous anticoagulant or antiplatelet  agents. ASA Grade Assessment: III - A patient with                            severe systemic disease. After reviewing the risks                            and benefits, the patient was deemed in                            satisfactory condition to undergo the procedure.                           After obtaining informed consent, the endoscope was               passed under direct vision. Throughout the                            procedure, the patient's blood pressure, pulse, and                            oxygen saturations were monitored continuously. The                            [id number]) scope was introduced through the                            mouth, and advanced to the second part of duodenum.                            The upper GI endoscopy was accomplished without                            difficulty. The patient tolerated the procedure                            well. Scope In: Scope Out: Findings:      Few superficial esophageal ulcers with oozing blood and stigmata of       recent bleeding were found 36 to 40 cm from the incisors. The largest       lesion was 9 mm in largest dimension. Biopsies were taken with a cold       forceps for histology.      Scattered severe inflammation characterized by congestion (edema),       erosions, erythema, friability and shallow ulcerations was found in the       entire examined stomach. Biopsies were taken with a cold forceps for       Helicobacter pylori testing.      A large hiatal hernia was found. The proximal extent of the gastric       folds (end of tubular esophagus) was 50 cm from the incisors. The Z-line       was 40 cm from the incisors.      The examined duodenum was normal. Impression:               - Bleeding esophageal ulcers. Biopsied.                           -  Gastritis. Biopsied.                           - Large hiatal hernia.                           - Normal examined duodenum. Moderate Sedation:      N/A- Per Anesthesia Care Recommendation:           - Patient has a contact number available for                            emergencies. The signs and symptoms of potential                            delayed complications were discussed with the                            patient. Return to normal activities tomorrow.                             Written discharge instructions were provided to the                            patient.                           - Resume previous diet.                           - Continue present medications.                           - Await pathology results.                           - No aspirin, ibuprofen, naproxen, or other                            non-steroidal anti-inflammatory drugs.                           - Use Protonix (pantoprazole) 40 mg PO BID.                           - Follow an antireflux regimen. Procedure Code(s):        --- Professional ---                           [phone], Esophagogastroduodenoscopy, flexible,                            transoral; with biopsy, single or multiple Diagnosis Code(s):        --- Professional ---                           K22.11, Ulcer of esophagus with bleeding  K29.70, Gastritis, unspecified, without bleeding                           K44.9, Diaphragmatic hernia without obstruction or                            gangrene                           D50.9, Iron deficiency anemia, unspecified                           R13.10, Dysphagia, unspecified CPT copyright 2016 American Medical Association. All rights reserved. The codes documented in this report are preliminary and upon coder review may  be revised to meet current compliance requirements. Mauri Pole, MD 10/19/2016 9:42:34 AM This report has been signed electronically. Number of Addenda: 0

## 2016-10-19 NOTE — Op Note (Signed)
[facility] Patient Name: Nicole Melendez Procedure Date: 10/19/2016 MRN: [id number] Attending MD: Mauri Pole , MD Date of Birth: 10/19/1976 CSN: [id number] Age: 40 Admit Type: Outpatient Procedure:                Colonoscopy Indications:              Unexplained iron deficiency anemia Providers:                Mauri Pole, MD, Zenon Mayo, RN, Elspeth Cho Tech., Technician, Rosario Adie, CRNA Referring MD:              Medicines:                Monitored Anesthesia Care Complications:            No immediate complications. Estimated Blood Loss:     Estimated blood loss: none. Procedure:                Pre-Anesthesia Assessment:                           - Prior to the procedure, a History and Physical                            was performed, and patient medications and                            allergies were reviewed. The patient's tolerance of                            previous anesthesia was also reviewed. The risks                            and benefits of the procedure and the sedation                            options and risks were discussed with the patient.                            All questions were answered, and informed consent                            was obtained. Prior Anticoagulants: The patient has                            taken no previous anticoagulant or antiplatelet                            agents. ASA Grade Assessment: III - A patient with                            severe systemic disease. After reviewing the risks  and benefits, the patient was deemed in                            satisfactory condition to undergo the procedure.                           After obtaining informed consent, the colonoscope                            was passed under direct vision. Throughout the                            procedure, the patient's blood pressure, pulse, and                             oxygen saturations were monitored continuously. The                            [id number]) scope was introduced through                            the anus and advanced to the the cecum, identified                            by appendiceal orifice and ileocecal valve. The                            colonoscopy was somewhat difficult due to poor                            bowel prep with stool present. Successful                            completion of the procedure was aided by lavage.                            The patient tolerated the procedure well. The                            quality of the bowel preparation was adequate. The                            ileocecal valve, appendiceal orifice, and rectum                            were photographed. Scope In: 9:06:52 AM Scope Out: 9:31:10 AM Scope Withdrawal Time: 0 hours 15 minutes 36 seconds  Total Procedure Duration: 0 hours 24 minutes 18 seconds  Findings:      The perianal and digital rectal examinations were normal.      Non-bleeding internal hemorrhoids were found during retroflexion. The       hemorrhoids were large.      The exam was otherwise without abnormality. Impression:               -  Non-bleeding internal hemorrhoids.                           - The examination was otherwise normal.                           - No specimens collected. Moderate Sedation:      N/A- Per Anesthesia Care Recommendation:           - Patient has a contact number available for                            emergencies. The signs and symptoms of potential                            delayed complications were discussed with the                            patient. Return to normal activities tomorrow.                            Written discharge instructions were provided to the                            patient.                           - Resume previous diet.                           - Continue present  medications.                           - Repeat colonoscopy in 10 years for screening                            purposes.                           - Return to GI clinic PRN. Procedure Code(s):        --- Professional ---                           [phone], Colonoscopy, flexible; diagnostic, including                            collection of specimen(s) by brushing or washing,                            when performed (separate procedure) Diagnosis Code(s):        --- Professional ---                           K64.8, Other hemorrhoids                           D50.9, Iron deficiency anemia, unspecified CPT copyright 2016 American Medical Association. All rights  reserved. The codes documented in this report are preliminary and upon coder review may  be revised to meet current compliance requirements. Mauri Pole, MD 10/19/2016 9:46:01 AM This report has been signed electronically. Number of Addenda: 0

## 2016-10-19 NOTE — Discharge Instructions (Signed)
YOU HAD AN ENDOSCOPIC PROCEDURE TODAY: Refer to the procedure report and other information in the discharge instructions given to you for any specific questions about what was found during the examination. If this information does not answer your questions, please call [place] office at [phone] to clarify.  ° °YOU SHOULD EXPECT: Some feelings of bloating in the abdomen. Passage of more gas than usual. Walking can help get rid of the air that was put into your GI tract during the procedure and reduce the bloating. If you had a lower endoscopy (such as a colonoscopy or flexible sigmoidoscopy) you may notice spotting of blood in your stool or on the toilet paper. Some abdominal soreness may be present for a day or two, also. ° °DIET: Your first meal following the procedure should be a light meal and then it is ok to progress to your normal diet. A half-sandwich or bowl of soup is an example of a good first meal. Heavy or fried foods are harder to digest and may make you feel nauseous or bloated. Drink plenty of fluids but you should avoid alcoholic beverages for 24 hours. If you had a esophageal dilation, please see attached instructions for diet.   ° °ACTIVITY: Your care partner should take you home directly after the procedure. You should plan to take it easy, moving slowly for the rest of the day. You can resume normal activity the day after the procedure however YOU SHOULD NOT DRIVE, use power tools, machinery or perform tasks that involve climbing or major physical exertion for 24 hours (because of the sedation medicines used during the test).  ° °SYMPTOMS TO REPORT IMMEDIATELY: °A gastroenterologist can be reached at any hour. Please call [phone]  for any of the following symptoms:  °Following lower endoscopy (colonoscopy, flexible sigmoidoscopy) °Excessive amounts of blood in the stool  °Significant tenderness, worsening of abdominal pains  °Swelling of the abdomen that is new, acute  °Fever of 100° or  higher  °Following upper endoscopy (EGD, EUS, ERCP, esophageal dilation) °Vomiting of blood or coffee ground material  °New, significant abdominal pain  °New, significant chest pain or pain under the shoulder blades  °Painful or persistently difficult swallowing  °New shortness of breath  °Black, tarry-looking or red, bloody stools ° °FOLLOW UP:  °If any biopsies were taken you will be contacted by phone or by letter within the next 1-3 weeks. Call [phone]  if you have not heard about the biopsies in 3 weeks.  °Please also call with any specific questions about appointments or follow up tests. ° °

## 2016-10-20 ENCOUNTER — Encounter (HOSPITAL_COMMUNITY): Payer: Self-pay | Admitting: Gastroenterology

## 2016-10-21 ENCOUNTER — Telehealth: Payer: Self-pay | Admitting: Gastroenterology

## 2016-10-21 NOTE — Telephone Encounter (Signed)
EGD with multiple biopsies on 10/19/16. She says her throat has become progressively more sore and she has become more hoarse since her procedure. 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 States her cough is productive of mucous with streak of red. "low grade temp" per the patient. Denies body aches, nausea or vomiting. No sinus congestion. She is taking Protonix as directed. Please advise. Thank you.

## 2016-10-22 ENCOUNTER — Other Ambulatory Visit: Payer: Self-pay

## 2016-10-22 MED ORDER — SUCRALFATE 1 G PO TABS: 1.0000 g | ORAL_TABLET | Freq: Three times a day (TID) | ORAL | 0 refills | Status: DC

## 2016-10-22 NOTE — Telephone Encounter (Signed)
Patient instructed and agrees to this plan of care. Carafate rx called to [facility]

## 2016-10-22 NOTE — Telephone Encounter (Signed)
Please advise patient to take Carafte 1gm before meals and at bedtime. She has severe esophageal ulceration likely secondary to reflux. Continue PPI and anti reflux measures

## 2016-10-26 ENCOUNTER — Encounter: Payer: Self-pay | Admitting: Emergency Medicine

## 2016-10-26 ENCOUNTER — Telehealth: Payer: Self-pay | Admitting: Gastroenterology

## 2016-10-27 ENCOUNTER — Other Ambulatory Visit: Payer: Self-pay

## 2016-10-27 DIAGNOSIS — M5136 Other intervertebral disc degeneration, lumbar region: Secondary | ICD-10-CM | POA: Diagnosis not present

## 2016-10-27 DIAGNOSIS — K2211 Ulcer of esophagus with bleeding: Secondary | ICD-10-CM

## 2016-10-27 DIAGNOSIS — D860 Sarcoidosis of lung: Secondary | ICD-10-CM | POA: Diagnosis not present

## 2016-10-27 DIAGNOSIS — M1990 Unspecified osteoarthritis, unspecified site: Secondary | ICD-10-CM | POA: Diagnosis not present

## 2016-10-29 ENCOUNTER — Other Ambulatory Visit: Payer: Self-pay

## 2016-10-29 MED ORDER — RANITIDINE HCL 150 MG PO TABS: 150.0000 mg | ORAL_TABLET | Freq: Every day | ORAL | 0 refills | Status: DC

## 2016-10-29 NOTE — Telephone Encounter (Signed)
Looks like she's on protonix twice daily already. Can you make sure she is taking this correctly; one pill 20-30 min prior to breakfast and dinner meals.  Also have her add ranitidine 150mg  pill (OTC) one pill at bedtime nightly.

## 2016-10-29 NOTE — Telephone Encounter (Signed)
Doc of the Day [place] patient Patient reports the swallowing is a little better since starting Carafate. She continues to have abdominal pain. She vomits solid foods, but tolerates soups and liquids. She denies choking. States her abdomen hurts to the point that she vomits her food. Waistbands on her clothing cause her discomfort to the point that she cannot wear them. There are not any openings on any schedule until 11/04/16. Please advise.  Patient is aware of the follow up EGD on 01/07/17 arrive at 8:00 am to [place].

## 2016-10-29 NOTE — Telephone Encounter (Signed)
Discussed with the patient. Confirmed how she takes Pantoprazole and Carafate. Reviewed diet. She agrees to add Zantac 150mg  qhs. Sent in as an Rx because it may be paid for through insurance.

## 2016-10-31 ENCOUNTER — Encounter (HOSPITAL_COMMUNITY): Payer: Self-pay

## 2016-10-31 ENCOUNTER — Emergency Department (HOSPITAL_COMMUNITY)
Admission: EM | Admit: 2016-10-31 | Discharge: 2016-10-31 | Disposition: A | Discharge status: Home / Self Care | Payer: BLUE CROSS/BLUE SHIELD | Attending: Emergency Medicine | Admitting: Emergency Medicine

## 2016-10-31 DIAGNOSIS — K2900 Acute gastritis without bleeding: Secondary | ICD-10-CM | POA: Diagnosis not present

## 2016-10-31 DIAGNOSIS — R1010 Upper abdominal pain, unspecified: Secondary | ICD-10-CM | POA: Diagnosis present

## 2016-10-31 DIAGNOSIS — E1140 Type 2 diabetes mellitus with diabetic neuropathy, unspecified: Secondary | ICD-10-CM | POA: Diagnosis not present

## 2016-10-31 DIAGNOSIS — Z79899 Other long term (current) drug therapy: Secondary | ICD-10-CM | POA: Insufficient documentation

## 2016-10-31 DIAGNOSIS — Z87891 Personal history of nicotine dependence: Secondary | ICD-10-CM | POA: Insufficient documentation

## 2016-10-31 DIAGNOSIS — K2901 Acute gastritis with bleeding: Secondary | ICD-10-CM | POA: Diagnosis not present

## 2016-10-31 DIAGNOSIS — Z794 Long term (current) use of insulin: Secondary | ICD-10-CM | POA: Insufficient documentation

## 2016-10-31 DIAGNOSIS — I10 Essential (primary) hypertension: Secondary | ICD-10-CM | POA: Diagnosis not present

## 2016-10-31 LAB — URINALYSIS, ROUTINE W REFLEX MICROSCOPIC
BILIRUBIN URINE: NEGATIVE
Glucose, UA: NEGATIVE mg/dL
Hgb urine dipstick: NEGATIVE
KETONES UR: NEGATIVE mg/dL
Leukocytes, UA: NEGATIVE
NITRITE: NEGATIVE
PROTEIN: NEGATIVE mg/dL
Specific Gravity, Urine: 1.01 (ref 1.005–1.030)
pH: 6 (ref 5.0–8.0)

## 2016-10-31 LAB — CBC
HCT: 36.8 % (ref 36.0–46.0)
Hemoglobin: 12.6 g/dL (ref 12.0–15.0)
MCH: 28.3 pg (ref 26.0–34.0)
MCHC: 34.2 g/dL (ref 30.0–36.0)
MCV: 82.5 fL (ref 78.0–100.0)
PLATELETS: 322 10*3/uL (ref 150–400)
RBC: 4.46 MIL/uL (ref 3.87–5.11)
RDW: 16 % — AB (ref 11.5–15.5)
WBC: 7.1 10*3/uL (ref 4.0–10.5)

## 2016-10-31 LAB — COMPREHENSIVE METABOLIC PANEL
ALBUMIN: 3.7 g/dL (ref 3.5–5.0)
ALK PHOS: 127 U/L — AB (ref 38–126)
ALT: 13 U/L — ABNORMAL LOW (ref 14–54)
AST: 15 U/L (ref 15–41)
Anion gap: 8 (ref 5–15)
BILIRUBIN TOTAL: 0.3 mg/dL (ref 0.3–1.2)
BUN: 8 mg/dL (ref 6–20)
CALCIUM: 9.1 mg/dL (ref 8.9–10.3)
CO2: 25 mmol/L (ref 22–32)
Chloride: 101 mmol/L (ref 101–111)
Creatinine, Ser: 0.8 mg/dL (ref 0.44–1.00)
GFR calc Af Amer: >60 mL/min (ref >60)
GLUCOSE: 101 mg/dL — AB (ref 65–99)
Potassium: 4.3 mmol/L (ref 3.5–5.1)
Sodium: 134 mmol/L — ABNORMAL LOW (ref 135–145)
TOTAL PROTEIN: 7 g/dL (ref 6.5–8.1)

## 2016-10-31 LAB — LIPASE, BLOOD: Lipase: 34 U/L (ref 11–51)

## 2016-10-31 MED ORDER — GI COCKTAIL ~~LOC~~
30.0000 mL | Freq: Once | ORAL | Status: AC
  Administered 2016-10-31: 30 mL via ORAL
  Filled 2016-10-31: qty 30

## 2016-10-31 NOTE — ED Notes (Signed)
Patient denies chest pain and SOB.  Patient denies blood in stool but states that her throat is irritated and has seen streaks of blood in her emesis.  Patient states that nausea comes and goes.

## 2016-10-31 NOTE — ED Triage Notes (Signed)
Patient c/o "stomach ache" since she had an endoscopy done on Dec. 26.  Patient states that the procedure was done due to bleeding ulcers.  Patient states that she has had N/V/D.  Patient states that she has been trying to stay hydrated with water and soups.  Patient contacted GI at [place] and they called in carafate on Dec. 29 and another medication that she began on Friday (she is unsure of the name).

## 2016-10-31 NOTE — ED Provider Notes (Signed)
[facility] DEPT Provider Note   CSN: [id number] Arrival date & time: 10/31/16  1501     History   Chief Complaint Chief Complaint  Patient presents with  . Abdominal Pain    HPI Nicole Melendez is a 41 y.o. female.  The history is provided by the patient. No language interpreter was used.  Abdominal Pain     Nicole Melendez is a 41 y.o. female who presents to the Emergency Department complaining of abdominal pain.  On December 26 she had an upper endoscopy as well as colonoscopy for iron deficiency anemia. The endoscopy demonstrated multiple gastric and esophageal ulcers. Since and endoscopy time she has had persistent pain in her throat and upper abdomen. She has vomiting whenever she tries to eat solid foods. The reports of fevers, diarrhea. She has discussed this with her gastroenterologist and she is taking medications as prescribed. Past Medical History:  Diagnosis Date  . Allergic rhinitis, cause unspecified   . Anemia   . Anxiety   . Depression   . Diabetes mellitus type II    steroid related, patient states "im no longer diabetic"  . Essential hypertension 08/08/2015  . GERD (gastroesophageal reflux disease)   . History of blood transfusion   . Hyperlipidemia   . Menorrhagia   . Migraine   . Morbid obesity ([place])   . Otitis media 06/28/2015  . Peptic ulcer disease   . Positive ANA (antinuclear antibody) 02/14/2012  . Sarcoid ([place])    including hand per rheumatology-Dr. Amil Amen  . Sarcoidosis of lung ([place])   . Shortness of breath    on exertion  . Varicose veins with pain     Patient Active Problem List   Diagnosis Date Noted  . Ulcer of esophagus with bleeding   . Gastritis and gastroduodenitis   . Blepharitis of right upper eyelid 10/09/2016  . Peroneal tendinitis of lower leg, right 10/06/2016  . Right ankle pain 09/16/2016  . Dizziness 03/11/2016  . Mixed headache 03/09/2016  . Chronic venous insufficiency 02/24/2016  . Gastroenteritis 12/30/2015    . Headache 12/30/2015  . Epistaxis 12/09/2015  . 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 Cervical muscle strain 12/03/2015  . Right-sided face pain 11/26/2015  . Multiple contusions 11/26/2015  . Neck pain, bilateral posterior 11/26/2015  . Lower back pain 11/26/2015  . Spinal stenosis of lumbar region 10/28/2015  . Chronic sciatica of left side 10/28/2015  . DOE (dyspnea on exertion) 09/02/2015  . Acute bronchitis 08/22/2015  . Essential hypertension 08/08/2015  . Diaphoresis 08/07/2015  . Cough 07/02/2015  . Vaginitis and vulvovaginitis 06/29/2015  . Otitis media 06/28/2015  . Thrush 06/24/2015  . N&V (nausea and vomiting) 06/10/2015  . Acute upper respiratory infection 11/14/2014  . Wheezing 11/14/2014  . Angioedema 03/19/2014  . Vaginitis 11/19/2012  . Smoker 11/19/2012  . Positive ANA (antinuclear antibody) 02/14/2012  . Polyarthritis 02/11/2012  . Migraine 04/05/2011  . Preventative health care 02/21/2011  . Allergic rhinitis 02/17/2011  . URTICARIA 09/29/2010  . DEPRESSION 09/14/2010  . Diabetes mellitus with neuropathy ([place]) 07/31/2010  . PERIPHERAL EDEMA 07/31/2010  . INSOMNIA-SLEEP DISORDER-UNSPEC 06/19/2010  . 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 MENORRHAGIA 05/15/2010  . Hyperlipidemia 12/17/2009  . Iron deficiency anemia 12/17/2009  . Anxiety state 12/17/2009  . Severe obesity (BMI >= 40) ([place]) 07/02/2007  . Gastroesophageal reflux disease 07/02/2007  . PEPTIC ULCER DISEASE 07/02/2007    Past Surgical History:  Procedure Laterality Date  . ABDOMINAL HYSTERECTOMY    .  COLONOSCOPY WITH PROPOFOL N/A 10/19/2016   Procedure: COLONOSCOPY WITH PROPOFOL;  Surgeon: Mauri Pole, MD;  Location: [facility];  Service: Endoscopy;  Laterality: N/A;  . ESOPHAGOGASTRODUODENOSCOPY (EGD) WITH PROPOFOL N/A 10/19/2016   Procedure: ESOPHAGOGASTRODUODENOSCOPY (EGD) WITH PROPOFOL;  Surgeon: Mauri Pole, MD;  Location: [facility];  Service: Endoscopy;   Laterality: N/A;  . HERNIA MESH REMOVAL  02/2013  . uterine ablation  03/2010  . WISDOM TOOTH EXTRACTION      OB History    No data available       Home Medications    Prior to Admission medications   Medication Sig Start Date End Date Taking? Authorizing Provider  acetaminophen (TYLENOL) 500 MG tablet Take 1,000 mg by mouth every 6 (six) hours as needed for mild pain or moderate pain.    Historical Provider, MD  ALPRAZolam Duanne Moron) 0.5 MG tablet Take 1 tablet (0.5 mg total) by mouth 3 (three) times daily as needed. Patient taking differently: Take 0.5 mg by mouth 3 (three) times daily as needed for anxiety.  09/01/16   Biagio Borg, MD  azelastine (ASTELIN) 0.1 % nasal spray Place 2 sprays into both nostrils 2 (two) times daily. Use in each nostril as directed 03/11/16   Biagio Borg, MD  Blood Glucose Monitoring Suppl (ONE TOUCH ULTRA 2) W/DEVICE KIT Use as directed four times daily. Dx: E11.9 08/11/15   Renato Shin, MD  budesonide-formoterol [facility]) 160-4.5 MCG/ACT inhaler Inhale 2 puffs into the lungs 2 (two) times daily. 06/30/16   Collene Gobble, MD  cetirizine (ZYRTEC) 10 MG tablet TAKE 1 TABLET(10 MG) BY MOUTH DAILY 08/30/16   Biagio Borg, MD  cyclobenzaprine (FLEXERIL) 5 MG tablet Take 1 tablet (5 mg total) by mouth at bedtime as needed for muscle spasms. Patient taking differently: Take 10 mg by mouth at bedtime as needed for muscle spasms.  10/17/14   Biagio Borg, MD  Diclofenac Sodium (PENNSAID) 2 % SOLN Place 2 application onto the skin 2 (two) times daily. 10/06/16   Lyndal Pulley, DO  doxycycline (VIBRA-TABS) 100 MG tablet Take 1 tablet (100 mg total) by mouth 2 (two) times daily. 10/08/16   Biagio Borg, MD  erythromycin ophthalmic ointment Place 1 application into the right eye 4 (four) times daily. 10/08/16   Biagio Borg, MD  folic acid (FOLVITE) 1 MG tablet TAKE 2 TABLETS BY MOUTH DAILY 06/30/16   Collene Gobble, MD  furosemide (LASIX) 40 MG tablet 1 tab by mouth  every day in the AM and then 1 as needed only for persistent swelling in the PM 06/16/16   Biagio Borg, MD  gabapentin (NEURONTIN) 300 MG capsule TAKE 1 CAPSULE(300 MG) BY MOUTH THREE TIMES DAILY 10/05/16   Biagio Borg, MD  glucose blood (ONE TOUCH ULTRA TEST) test strip Use to check blood sugars four times a day Dx E11.9 08/11/15   Renato Shin, MD  HUMALOG 100 UNIT/ML injection INJECT UP TO 12 UNITS INTO THE SKIN THREE TIMES DAILY WITH MEALS WITH SLIDING SCALE AS DIRECTED Patient taking differently: Inject 2 to 12 units 3 times a day as needed for high blood sugar 10/01/16   Biagio Borg, MD  Insulin Syringe-Needle U-100 25G X 1" 1 ML MISC Use to administer humalog insulin three times a day Dx E11.9 10/13/16   Biagio Borg, MD  Insulin Syringes, Disposable, U-100 1 ML MISC by Does not apply route. Use to administer insulin three times  a day. Dx E11.9    Historical Provider, MD  levalbuterol (XOPENEX HFA) 45 MCG/ACT inhaler Inhale 1-2 puffs into the lungs every 8 (eight) hours as needed for wheezing or shortness of breath. 06/30/16   Collene Gobble, MD  methotrexate (RHEUMATREX) 2.5 MG tablet Take 15 mg by mouth every Tuesday.     Historical Provider, MD  pantoprazole (PROTONIX) 40 MG tablet Take 1 tablet (40 mg total) by mouth 2 (two) times daily before a meal. 10/19/16   Mauri Pole, MD  potassium chloride (K-DUR) 10 MEQ tablet 3 tab by mouth per day only when taking the lasix Patient taking differently: Take 10-20 mEq by mouth 2 (two) times daily. Take 10 meq in the morning and 84mq at night 06/17/16   JBiagio Borg MD  ranitidine (ZANTAC) 150 MG tablet Take 1 tablet (150 mg total) by mouth at bedtime. 10/29/16   DMilus Banister MD  sertraline (ZOLOFT) 100 MG tablet TAKE 2 TABLETS BY MOUTH DAILY 10/01/16   JBiagio Borg MD  sucralfate (CARAFATE) 1 g tablet Take 1 tablet (1 g total) by mouth 4 (four) times daily -  with meals and at bedtime. 10/22/16 11/21/16  KMauri Pole MD  SUMAtriptan  (IMITREX) 100 MG tablet Take 1 tablet by mouth at the onset of a headache. May repeat in 2 hours if headache persists or recurs. 10/13/16   APieter Partridge DO  telmisartan (MICARDIS) 40 MG tablet Take 1 tablet (40 mg total) by mouth daily. 03/11/16   JBiagio Borg MD  TURMERIC PO Take 1 tablet by mouth daily.    Historical Provider, MD  vitamin B-12 (CYANOCOBALAMIN) 1000 MCG tablet Take 1,000 mcg by mouth daily.    Historical Provider, MD  Vitamin D, Ergocalciferol, (DRISDOL) 50000 units CAPS capsule Take 1 capsule (50,000 Units total) by mouth every 7 (seven) days. 10/06/16   ZLyndal Pulley DO  zonisamide (ZONEGRAN) 100 MG capsule Take 1 capsule (100 mg total) by mouth daily. 10/13/16   APieter Partridge DO    Family History Family History  Problem Relation Age of Onset  . Allergies Mother   . Heart attack Mother   . Diabetes Mother   . Diabetes Father   . Heart disease Father   . Rheum arthritis Father   . Stroke Father   . Hypertension    . Ovarian cancer Maternal Aunt   . Lung cancer Maternal Aunt   . Breast cancer Maternal Aunt   . Bone cancer Maternal Aunt   . Stroke Maternal Uncle   . Other Neg Hx     Social History Social History  Substance Use Topics  . Smoking status: Former Smoker    Packs/day: 1.00    Years: 20.00    Types: Cigarettes    Quit date: 10/25/2012  . Smokeless tobacco: Never Used     Comment: QUIT 04/2010 AND STARTED BACK 2014 X 3 MONTHS. less than 1 ppd.  started at age 41    .Marland KitchenAlcohol use No     Allergies   Azithromycin; Bee venom; Flagyl [metronidazole]; Penicillins; Shellfish allergy; and Klonopin [clonazepam]   Review of Systems Review of Systems  Gastrointestinal: Positive for abdominal pain.  All other systems reviewed and are negative.    Physical Exam Updated Vital Signs BP 141/78 (BP Location: Left Arm)   Pulse 82   Temp 98.7 F (37.1 C) (Oral)   Resp 18   LMP 07/18/2011   SpO2 98%  Physical Exam  Constitutional: She is  oriented to person, place, and time. She appears well-developed and well-nourished.  HENT:  Head: Normocephalic and atraumatic.  Mouth/Throat: Oropharynx is clear and moist.  Cardiovascular: Normal rate and regular rhythm.   No murmur heard. Pulmonary/Chest: Effort normal and breath sounds normal. No respiratory distress.  Abdominal: Soft. There is no rebound and no guarding.  Mild upper abdominal tenderness  Musculoskeletal: She exhibits no edema or tenderness.  Neurological: She is alert and oriented to person, place, and time.  Skin: Skin is warm and dry.  Psychiatric: She has a normal mood and affect. Her behavior is normal.  Nursing note and vitals reviewed.    ED Treatments / Results  Labs (all labs ordered are listed, but only abnormal results are displayed) Labs Reviewed  COMPREHENSIVE METABOLIC PANEL - Abnormal; Notable for the following:       Result Value   Sodium 134 (*)    Glucose, Bld 101 (*)    ALT 13 (*)    Alkaline Phosphatase 127 (*)    All other components within normal limits  CBC - Abnormal; Notable for the following:    RDW 16.0 (*)    All other components within normal limits  LIPASE, BLOOD  URINALYSIS, ROUTINE W REFLEX MICROSCOPIC    EKG  EKG Interpretation None       Radiology No results found.  Procedures Procedures (including critical care time)  Medications Ordered in ED Medications  gi cocktail (Maalox,Lidocaine,Donnatal) (30 mLs Oral Given 10/31/16 2024)     Initial Impression / Assessment and Plan / ED Course  I have reviewed the triage vital signs and the nursing notes.  Pertinent labs & imaging results that were available during my care of the patient were reviewed by me and considered in my medical decision making (see chart for details).  Clinical Course     Patient here following a recent endoscopy with abdominal pain, vomiting, diarrhea. Abdominal examination is benign with minimal tenderness. She is well-hydrated and  nontoxic on exam. Clinical picture is not consistent with serious bacterial infection, perforated viscus. Her symptoms did improve following GI cocktail in the emergency department. Discussed following up with GI and continuing her current medications. Home care and return precautions discussed.  Final Clinical Impressions(s) / ED Diagnoses   Final diagnoses:  Acute gastritis, presence of bleeding unspecified, unspecified gastritis type    New Prescriptions New Prescriptions   No medications on file     Quintella Reichert, MD 11/01/16 0040

## 2016-11-01 ENCOUNTER — Telehealth: Payer: Self-pay | Admitting: Gastroenterology

## 2016-11-01 NOTE — Telephone Encounter (Signed)
Pt scheduled to see Dr. Silverio Decamp 11/08/16@9am  for abdominal pain. Pt aware of appt.

## 2016-11-02 NOTE — Progress Notes (Signed)
[facility] [address] [place], [place] [address] Phone: [phone] Subjective:    I'm seeing this patient by the request  of:  Cathlean Cower, MD   CC: Right ankle pain F/U  [id number]  Nicole Melendez is a 41 y.o. female coming in with complaint of right ankle pain. Past family history significant for spinal stenosis as well as sarcoidosis. Patient was seen and was found to have more upper no tendinitis. Has been doing the exercises religiously. Has not been icing. States that only mild improvement. Seems to be still give pain on the lateral aspect the ankle mostly with activity. Seems better with rest. No significant improvement with the topical anti-inflammatory's.    Past Medical History:  Diagnosis Date  . Allergic rhinitis, cause unspecified   . Anemia   . Anxiety   . Depression   . Diabetes mellitus type II    steroid related, patient states "im no longer diabetic"  . Essential hypertension 08/08/2015  . GERD (gastroesophageal reflux disease)   . History of blood transfusion   . Hyperlipidemia   . Menorrhagia   . Migraine   . Morbid obesity ([place])   . Otitis media 06/28/2015  . Peptic ulcer disease   . Positive ANA (antinuclear antibody) 02/14/2012  . Sarcoid ([place])    including hand per rheumatology-Dr. Amil Amen  . Sarcoidosis of lung ([place])   . Shortness of breath    on exertion  . Varicose veins with pain    Past Surgical History:  Procedure Laterality Date  . ABDOMINAL HYSTERECTOMY    . COLONOSCOPY WITH PROPOFOL N/A 10/19/2016   Procedure: COLONOSCOPY WITH PROPOFOL;  Surgeon: Mauri Pole, MD;  [facility]: [facility];  Service: Endoscopy;  Laterality: N/A;  . ESOPHAGOGASTRODUODENOSCOPY (EGD) WITH PROPOFOL N/A 10/19/2016   Procedure: ESOPHAGOGASTRODUODENOSCOPY (EGD) WITH PROPOFOL;  Surgeon: Mauri Pole, MD;  Location: [facility];  Service: Endoscopy;  Laterality: N/A;  . HERNIA MESH REMOVAL  02/2013  . uterine  ablation  03/2010  . WISDOM TOOTH EXTRACTION     Social History   Social History  . Marital status: Single    Spouse name: N/A  . Number of children: 0  . Years of education: N/A   Occupational History  . personal shopper    Social History Main Topics  . Smoking status: Former Smoker    Packs/day: 1.00    Years: 20.00    Types: Cigarettes    Quit date: 10/25/2012  . Smokeless tobacco: Never Used     Comment: QUIT 04/2010 AND STARTED BACK 2014 X 3 MONTHS. less than 1 ppd.  started at age 77.    Marland Kitchen Alcohol use No  . Drug use: No  . Sexual activity: Yes    Birth control/ protection: None   Other Topics Concern  . None   Social History Narrative   Pt lives with [name]- also pt of [facility]   Allergies  Allergen Reactions  . Azithromycin Hives    (z pak) hives  . Bee Venom Anaphylaxis  . Flagyl [Metronidazole] Hives and Shortness Of Breath  . Penicillins Shortness Of Breath and Swelling    Has patient had a PCN reaction causing immediate rash, facial/tongue/throat swelling, SOB or lightheadedness with hypotension: Yes Has patient had a PCN reaction causing severe rash involving mucus membranes or skin necrosis: No Has patient had a PCN reaction that required hospitalization No Has patient had a PCN reaction occurring within the last 10  years: No If all of the above answers are "NO", then may proceed with Cephalosporin use.  REACTION: swelling and difficulty breathing  . Shellfish Allergy Anaphylaxis  . Klonopin [Clonazepam] Other (See Comments)    Memory difficulty   Family History  Problem Relation Age of Onset  . Allergies Mother   . Heart attack Mother   . Diabetes Mother   . Diabetes Father   . Heart disease Father   . Rheum arthritis Father   . Stroke Father   . Hypertension    . Ovarian cancer Maternal Aunt   . Lung cancer Maternal Aunt   . Breast cancer Maternal Aunt   . Bone cancer Maternal Aunt   . Stroke Maternal Uncle   . Other Neg Hx     Past  medical history, social, surgical and family history all reviewed in electronic medical record.  No pertanent information unless stated regarding to the chief complaint.   Review of Systems: No headache, visual changes, nausea, vomiting, diarrhea, constipation, dizziness, abdominal pain, skin rash, fevers, chills, night sweats, weight loss, swollen lymph nodes, chest pain, shortness of breath, mood changes.  .   Objective  Blood pressure 128/82, pulse 94, height 5\' 6"  (1.676 m), weight (!) 321 lb (145.6 kg), last menstrual period 07/18/2011, SpO2 97 %.   Systems examined below as of 11/03/16 General: NAD A&O x3 mood, affect normal [place] HEENT: Pupils equal, extraocular movements intact no nystagmus Respiratory: not short of breath at rest or with speaking Cardiovascular: No lower extremity edema, non tender Skin: Warm dry intact with no signs of infection or rash on extremities or on axial skeleton. Abdomen: Soft nontender, no masses Neuro: Cranial nerves  intact, neurovascularly intact in all extremities with 2+ DTRs and 2+ pulses. Lymph: No lymphadenopathy appreciated today  Gait normal with good balance and coordination.  MSK: Non tender with full range of motion and good stability and symmetric strength and tone of shoulders, elbows, wrist,  knee hips and Bilaterally.   Ankle: Right Pes planus noted in severe. Continue mild weakness with resisted eversion of the ankle compared to contralateral sign Stable lateral and medial ligaments; squeeze test and kleiger test unremarkable; Talar dome nontender; No pain at base of 5th MT; No tenderness over cuboid; No tenderness over N spot or navicular prominence No tenderness on posterior aspects of lateral and medial malleolus Pain over the peroneal tendon still present.  Able to walk 4 steps. Minimal change from previous exam  Procedure: Real-time Ultrasound Guided Injection of right peroneal tendon sheath Device: GE Logiq E  Ultrasound  guided injection is preferred based studies that show increased duration, increased effect, greater accuracy, decreased procedural pain, increased response rate, and decreased cost with ultrasound guided versus blind injection.  Verbal informed consent obtained.  Time-out conducted.  Noted no overlying erythema, induration, or other signs of local infection.  Skin prepped in a sterile fashion.  Local anesthesia: Topical Ethyl chloride.  With sterile technique and under real time ultrasound guidance:  With a 25-gauge half-inch needle patient was injected with a total of 0.5 mL of 0.5% Marcaine and 0.5 mL of Kenalog 40 mg/dL. Completed without difficulty  Pain immediately resolved suggesting accurate placement of the medication.  Advised to call if fevers/chills, erythema, induration, drainage, or persistent bleeding.  Images permanently stored and available for review in the ultrasound unit.  Impression: Technically successful ultrasound guided injection.    Impression and Recommendations:     This case required medical decision making  of moderate complexity.      Note: This dictation was prepared with Dragon dictation along with smaller phrase technology. Any transcriptional errors that result from this process are unintentional.

## 2016-11-03 ENCOUNTER — Ambulatory Visit (INDEPENDENT_AMBULATORY_CARE_PROVIDER_SITE_OTHER): Payer: BLUE CROSS/BLUE SHIELD | Admitting: Family Medicine

## 2016-11-03 ENCOUNTER — Ambulatory Visit: Payer: Self-pay

## 2016-11-03 ENCOUNTER — Encounter: Payer: Self-pay | Admitting: Family Medicine

## 2016-11-03 VITALS — BP 128/82 | HR 94 | Ht 66.0 in | Wt 321.0 lb

## 2016-11-03 DIAGNOSIS — M7671 Peroneal tendinitis, right leg: Secondary | ICD-10-CM

## 2016-11-03 NOTE — Assessment & Plan Note (Signed)
Patient given injection today under ultrasound guidance. Tolerated the procedure well. Encourage her to continue the same medications. We discussed continuing the home exercises. Decline formal physical therapy. Patient will wear bracing with increasing activity. Follow-up again in 4-6 weeks.

## 2016-11-03 NOTE — Patient Instructions (Signed)
Good to see you  Nicole Melendez is your friend.  Stay active.  Keep up with the exercises.  I would wear brace with exercising.  See me again in 4 weeks to make sure much better.

## 2016-11-08 ENCOUNTER — Ambulatory Visit (INDEPENDENT_AMBULATORY_CARE_PROVIDER_SITE_OTHER): Payer: BLUE CROSS/BLUE SHIELD | Admitting: Gastroenterology

## 2016-11-08 ENCOUNTER — Encounter: Payer: Self-pay | Admitting: Gastroenterology

## 2016-11-08 VITALS — BP 130/78 | HR 103 | Ht 66.0 in | Wt 320.0 lb

## 2016-11-08 DIAGNOSIS — K219 Gastro-esophageal reflux disease without esophagitis: Secondary | ICD-10-CM | POA: Diagnosis not present

## 2016-11-08 DIAGNOSIS — K2210 Ulcer of esophagus without bleeding: Secondary | ICD-10-CM | POA: Diagnosis not present

## 2016-11-08 DIAGNOSIS — R112 Nausea with vomiting, unspecified: Secondary | ICD-10-CM

## 2016-11-08 DIAGNOSIS — Z9889 Other specified postprocedural states: Secondary | ICD-10-CM

## 2016-11-08 DIAGNOSIS — R1011 Right upper quadrant pain: Secondary | ICD-10-CM

## 2016-11-08 DIAGNOSIS — D500 Iron deficiency anemia secondary to blood loss (chronic): Secondary | ICD-10-CM

## 2016-11-08 NOTE — Progress Notes (Signed)
Nicole Melendez    [id number]    01/27/1976  Primary Care Physician:James Jenny Reichmann, MD  Referring Physician: Biagio Borg, MD [address] [place], [place] [address]  Chief complaint:  Vomiting, nausea, abdominal pain, difficulty swallowing  HPI: 41 year old female with morbid obesity, sarcoidosis here with complaints of persistent nausea, vomiting and abdominal pain. She was last seen in office by Tye Savoy 09/08/16 for evaluation of iron deficiency anemia. Subsequently had EGD and colonoscopy by me, that showed evidence of gastric sleeve and severe esophageal ulcers. Colonoscopy was normal except for internal hemorrhoids.  Patient reports that she continues to vomit on daily basis, able to tolerate mostly liquid diet. She is drinking protein shakes about 40-60 ounce daily. No weight loss compared to her weight on prior visits. She was recently switched from oral methotrexate to injectable. Her diabetes is well controlled with blood sugar ranging from 70-90's and A1c of 6.1. She is having right upper quadrant pain, worse postprandial. Patient is concerned that she is unable to eat solid food without vomiting.    Outpatient Encounter Prescriptions as of 11/08/2016  Medication Sig  . acetaminophen (TYLENOL) 500 MG tablet Take 1,000 mg by mouth every 6 (six) hours as needed for mild pain or moderate pain.  Marland Kitchen ALPRAZolam (XANAX) 0.5 MG tablet Take 1 tablet (0.5 mg total) by mouth 3 (three) times daily as needed. (Patient taking differently: Take 0.5 mg by mouth 3 (three) times daily as needed for anxiety. )  . azelastine (ASTELIN) 0.1 % nasal spray Place 2 sprays into both nostrils 2 (two) times daily. Use in each nostril as directed  . Blood Glucose Monitoring Suppl (ONE TOUCH ULTRA 2) W/DEVICE KIT Use as directed four times daily. Dx: E11.9  . budesonide-formoterol (SYMBICORT) 160-4.5 MCG/ACT inhaler Inhale 2 puffs into the lungs 2 (two) times daily.  . cetirizine (ZYRTEC)  10 MG tablet TAKE 1 TABLET(10 MG) BY MOUTH DAILY  . cyclobenzaprine (FLEXERIL) 5 MG tablet Take 1 tablet (5 mg total) by mouth at bedtime as needed for muscle spasms. (Patient taking differently: Take 10 mg by mouth at bedtime as needed for muscle spasms. )  . Diclofenac Sodium (PENNSAID) 2 % SOLN Place 2 application onto the skin 2 (two) times daily.  Marland Kitchen doxycycline (VIBRA-TABS) 100 MG tablet Take 1 tablet (100 mg total) by mouth 2 (two) times daily.  Marland Kitchen erythromycin ophthalmic ointment Place 1 application into the right eye 4 (four) times daily.  . folic acid (FOLVITE) 1 MG tablet TAKE 2 TABLETS BY MOUTH DAILY  . furosemide (LASIX) 40 MG tablet 1 tab by mouth every day in the AM and then 1 as needed only for persistent swelling in the PM  . gabapentin (NEURONTIN) 300 MG capsule TAKE 1 CAPSULE(300 MG) BY MOUTH THREE TIMES DAILY  . glucose blood (ONE TOUCH ULTRA TEST) test strip Use to check blood sugars four times a day Dx E11.9  . HUMALOG 100 UNIT/ML injection INJECT UP TO 12 UNITS INTO THE SKIN THREE TIMES DAILY WITH MEALS WITH SLIDING SCALE AS DIRECTED (Patient taking differently: Inject 2 to 12 units 3 times a day as needed for high blood sugar)  . Insulin Syringe-Needle U-100 25G X 1" 1 ML MISC Use to administer humalog insulin three times a day Dx E11.9  . Insulin Syringes, Disposable, U-100 1 ML MISC by Does not apply route. Use to administer insulin three times a day. Dx E11.9  .  levalbuterol (XOPENEX HFA) 45 MCG/ACT inhaler Inhale 1-2 puffs into the lungs every 8 (eight) hours as needed for wheezing or shortness of breath.  . methotrexate (RHEUMATREX) 2.5 MG tablet Take 15 mg by mouth every Tuesday.   . pantoprazole (PROTONIX) 40 MG tablet Take 1 tablet (40 mg total) by mouth 2 (two) times daily before a meal.  . potassium chloride (K-DUR) 10 MEQ tablet 3 tab by mouth per day only when taking the lasix (Patient taking differently: Take 10-20 mEq by mouth 2 (two) times daily. Take 10 meq in the  morning and 96mq at night)  . ranitidine (ZANTAC) 150 MG tablet Take 1 tablet (150 mg total) by mouth at bedtime.  . sertraline (ZOLOFT) 100 MG tablet TAKE 2 TABLETS BY MOUTH DAILY  . sucralfate (CARAFATE) 1 g tablet Take 1 tablet (1 g total) by mouth 4 (four) times daily -  with meals and at bedtime.  . SUMAtriptan (IMITREX) 100 MG tablet Take 1 tablet by mouth at the onset of a headache. May repeat in 2 hours if headache persists or recurs.  .Marland Kitchentelmisartan (MICARDIS) 40 MG tablet Take 1 tablet (40 mg total) by mouth daily.  . TURMERIC PO Take 1 tablet by mouth daily.  . vitamin B-12 (CYANOCOBALAMIN) 1000 MCG tablet Take 1,000 mcg by mouth daily.  . Vitamin D, Ergocalciferol, (DRISDOL) 50000 units CAPS capsule Take 1 capsule (50,000 Units total) by mouth every 7 (seven) days.  .Marland Kitchenzonisamide (ZONEGRAN) 100 MG capsule Take 1 capsule (100 mg total) by mouth daily.   No facility-administered encounter medications on file as of 11/08/2016.     Allergies as of 11/08/2016 - Review Complete 11/08/2016  Allergen Reaction Noted  . Azithromycin Hives 01/22/2011  . Bee venom Anaphylaxis 10/04/2015  . Flagyl [metronidazole] Hives and Shortness Of Breath 11/21/2015  . Penicillins Shortness Of Breath and Swelling   . Shellfish allergy Anaphylaxis 10/04/2015  . Klonopin [clonazepam] Other (See Comments) 02/15/2014    Past Medical History:  Diagnosis Date  . Allergic rhinitis, cause unspecified   . Anemia   . Anxiety   . Depression   . Diabetes mellitus type II    steroid related, patient states "im no longer diabetic"  . Essential hypertension 08/08/2015  . GERD (gastroesophageal reflux disease)   . History of blood transfusion   . Hyperlipidemia   . Menorrhagia   . Migraine   . Morbid obesity ([facility]   . Otitis media 06/28/2015  . Peptic ulcer disease   . Positive ANA (antinuclear antibody) 02/14/2012  . Sarcoid ([facility]    including hand per rheumatology-Dr. BAmil Amen . Sarcoidosis of lung ([facility]    . Shortness of breath    on exertion  . Varicose veins with pain     Past Surgical History:  Procedure Laterality Date  . ABDOMINAL HYSTERECTOMY    . COLONOSCOPY WITH PROPOFOL N/A 10/19/2016   Procedure: COLONOSCOPY WITH PROPOFOL;  Surgeon: KMauri Pole MD;  Location: [facility];  Service: Endoscopy;  Laterality: N/A;  . ESOPHAGOGASTRODUODENOSCOPY (EGD) WITH PROPOFOL N/A 10/19/2016   Procedure: ESOPHAGOGASTRODUODENOSCOPY (EGD) WITH PROPOFOL;  Surgeon: KMauri Pole MD;  Location: [facility];  Service: Endoscopy;  Laterality: N/A;  . HERNIA MESH REMOVAL  02/2013  . uterine ablation  03/2010  . WISDOM TOOTH EXTRACTION      Family History  Problem Relation Age of Onset  . Allergies Mother   . Heart attack Mother   . Diabetes Mother   . Diabetes Father   .  Heart disease Father   . Rheum arthritis Father   . Stroke Father   . Hypertension    . Ovarian cancer Maternal Aunt   . Lung cancer Maternal Aunt   . Breast cancer Maternal Aunt   . Bone cancer Maternal Aunt   . Stroke Maternal Uncle   . Other Neg Hx     Social History   Social History  . Marital status: Single    Spouse name: N/A  . Number of children: 0  . Years of education: N/A   Occupational History  . personal shopper    Social History Main Topics  . Smoking status: Former Smoker    Packs/day: 1.00    Years: 20.00    Types: Cigarettes    Quit date: 10/25/2012  . Smokeless tobacco: Never Used     Comment: QUIT 04/2010 AND STARTED BACK 2014 X 3 MONTHS. less than 1 ppd.  started at age 12.    Marland Kitchen Alcohol use No  . Drug use: No  . Sexual activity: Yes    Birth control/ protection: None   Other Topics Concern  . Not on file   Social History Narrative   Pt lives with [name]- also pt of [facility]      Review of systems: Review of Systems  Constitutional: Negative for fever and chills.  HENT: Negative.   Eyes: Negative for blurred vision.  Respiratory: Negative for cough, shortness of  breath and wheezing.   Cardiovascular: Negative for chest pain and palpitations.  Gastrointestinal: as per HPI Genitourinary: Negative for dysuria, urgency, frequency and hematuria.  Musculoskeletal: Negative for myalgias, back pain and joint pain.  Skin: Negative for itching and rash.  Neurological: Negative for dizziness, tremors, focal weakness, seizures and loss of consciousness.  Endo/Heme/Allergies: Positive for seasonal allergies.  Psychiatric/Behavioral: Negative for depression, suicidal ideas and hallucinations.  All other systems reviewed and are negative.   Physical Exam: Vitals:   11/08/16 0857  BP: 130/78  Pulse: (!) 103   Body mass index is 51.65 kg/m. Gen:      No acute distress HEENT:  EOMI, sclera anicteric Neck:     No masses; no thyromegaly Lungs:    Clear to auscultation bilaterally; normal respiratory effort CV:         Regular rate and rhythm; no murmurs Abd:      + bowel sounds; soft, non-tender; no palpable masses, no distension Ext:    No edema; adequate peripheral perfusion Skin:      Warm and dry; no rash Neuro: alert and oriented x 3 Psych: normal mood and affect  Data Reviewed:  Reviewed labs, radiology imaging, old records and pertinent past GI work up   Assessment and Plan/Recommendations:  21-year -old female morbidly obese status post-gastric sleeve surgery, iron deficiency anemia here for follow-up visit  nausea and vomiting: Unclear etiology Patient does not have any weight loss, could be secondary to excessive eating with decreased gastric accomadation status post gastric sleeve  advised patient to eat small meals Avoid excessive fiber and fat  Severe esophageal ulceration: Likely in the setting of recurrent cyclic vomiting PPI twice daily, 30-45 minutes before breakfast and dinner Carafate 3 times a day before meals and at bedtime Follow-up EGD to document healing of the ulcers  Iron deficiency anemia :secondary to chronic GI  blood loss setting of esophageal ulceration  Right upper quadrant abdominal pain: We'll obtain abdominal ultrasound to evaluate and exclude gallbladder disease  Morbid obesity : Discussed diet  and exercise and advise patient to try to lose 10% body weight in next 3-6 months Decrease amount of protein shakes as she seems to be getting excessive calories through that  Return in 3 months or sooner if needed  Greater than 50% of the time used for counseling as well as treatment plan and follow-up. She had multiple questions which were answered to her satisfaction  K. Denzil Magnuson , MD [phone] Mon-Fri 8a-5p [phone] after 5p, weekends, holidays  CC: Biagio Borg, MD

## 2016-11-08 NOTE — Patient Instructions (Signed)
You have been scheduled for an abdominal ultrasound at [facility] (1st floor of hospital) on 11/15/2016 at 9am. Please arrive 15 minutes prior to your appointment for registration. Make certain not to have anything to eat or drink 6 hours prior to your appointment. Should you need to reschedule your appointment, please contact radiology at [phone]. This test typically takes about 30 minutes to perform.  Eat small frequent meals  We will send in Protonix and and carafate to your pharmacy

## 2016-11-15 ENCOUNTER — Ambulatory Visit (HOSPITAL_COMMUNITY)
Admission: RE | Admit: 2016-11-15 | Discharge: 2016-11-15 | Disposition: A | Discharge status: Home / Self Care | Payer: BLUE CROSS/BLUE SHIELD | Source: Ambulatory Visit | Attending: Gastroenterology | Admitting: Gastroenterology

## 2016-11-15 DIAGNOSIS — R112 Nausea with vomiting, unspecified: Secondary | ICD-10-CM | POA: Insufficient documentation

## 2016-11-15 DIAGNOSIS — R161 Splenomegaly, not elsewhere classified: Secondary | ICD-10-CM | POA: Diagnosis not present

## 2016-11-15 DIAGNOSIS — K2210 Ulcer of esophagus without bleeding: Secondary | ICD-10-CM | POA: Diagnosis not present

## 2016-11-15 DIAGNOSIS — K219 Gastro-esophageal reflux disease without esophagitis: Secondary | ICD-10-CM | POA: Insufficient documentation

## 2016-11-17 DIAGNOSIS — M533 Sacrococcygeal disorders, not elsewhere classified: Secondary | ICD-10-CM | POA: Diagnosis not present

## 2016-11-22 ENCOUNTER — Telehealth: Payer: Self-pay | Admitting: Gastroenterology

## 2016-11-22 NOTE — Telephone Encounter (Signed)
Pt has been given the results and has already been set up for EGD

## 2016-11-24 ENCOUNTER — Telehealth: Payer: Self-pay | Admitting: *Deleted

## 2016-11-25 NOTE — Telephone Encounter (Signed)
Dr Silverio Decamp I spoke with patient and she said she has been out of work since 10/19/2016. She has not returned since being in the hospital for bleeding ulcers, she continues to stay nauseated and very sick. She states its from the bleeding ulcers  She has an appt to return to the office 2/26. She said she wants to return to work but has been to sick, and her intentions are to return to work eventually she is on temporary disability through her job at this time. But has to have forms filled out from 12/26

## 2016-11-25 NOTE — Telephone Encounter (Signed)
Ok, thanks.

## 2016-11-25 NOTE — Telephone Encounter (Signed)
I saw patient only once in office in January 2018 and prior to that did EGD and colonoscopy in November 2017. Her GI issues have been chronic and date prior to the endoscopic procedures. She was followed at [facility] prior to this and has had gastric surgery. I do not feel comfortable filling the disability forms, she may contact surgeon's office or PMD

## 2016-11-25 NOTE — Telephone Encounter (Signed)
Need to discuss FMLA forms with patient, per Dr Silverio Decamp she wants to know why the patient needs these filled out. If she needs to be out of work her PCP will have to fill them out. Will have to discuss with patient

## 2016-11-26 NOTE — Telephone Encounter (Signed)
Called and left message for patient that Dr Silverio Decamp could not fill out forms at this time that she needs to contact her PCP or Surgeon   Told her to contact the office with any further questions  Will return forms back over to Medical Records today for pt to pick up

## 2016-12-01 ENCOUNTER — Ambulatory Visit: Payer: Self-pay

## 2016-12-01 ENCOUNTER — Ambulatory Visit (INDEPENDENT_AMBULATORY_CARE_PROVIDER_SITE_OTHER): Payer: BLUE CROSS/BLUE SHIELD | Admitting: Family Medicine

## 2016-12-01 ENCOUNTER — Encounter: Payer: Self-pay | Admitting: Family Medicine

## 2016-12-01 VITALS — BP 154/110 | HR 100 | Ht 66.0 in | Wt 326.0 lb

## 2016-12-01 DIAGNOSIS — S82891A Other fracture of right lower leg, initial encounter for closed fracture: Secondary | ICD-10-CM

## 2016-12-01 DIAGNOSIS — M25571 Pain in right ankle and joints of right foot: Secondary | ICD-10-CM

## 2016-12-01 NOTE — Patient Instructions (Signed)
Good to see yo u Go back to the plastic brace Post op shoe daily  You have a new fracture. It will take some time Continue the vitamin D Ice still could be good.  See me again in 3 weeks.

## 2016-12-01 NOTE — Progress Notes (Signed)
[facility] [address] [place],  [address] Phone: [phone] Subjective:    I'm seeing this patient by the request  of:  Cathlean Cower, MD   CC: Right ankle pain F/U  [id number]  Nicole Melendez is a 41 y.o. female coming in with complaint of right ankle pain. Past family history significant for spinal stenosis as well as sarcoidosis. Patient was seen peroneal tendon problems. Has rolled the ankle again. Patient states that this pain seems to be worse than the original pain. Still on the lateral aspect the ankle and the top of the foot. States that has had difficulty. Continues to wear braces with no significant benefit. An states that it can be very painful with the first steps in the morning of the dorsal aspect of the foot. Did have initially swelling that has now been down.     Past Medical History:  Diagnosis Date  . Allergic rhinitis, cause unspecified   . Anemia   . Anxiety   . Depression   . Diabetes mellitus type II    steroid related, patient states "im no longer diabetic"  . Essential hypertension 08/08/2015  . GERD (gastroesophageal reflux disease)   . History of blood transfusion   . Hyperlipidemia   . Menorrhagia   . Migraine   . Morbid obesity ([place])   . Otitis media 06/28/2015  . Peptic ulcer disease   . Positive ANA (antinuclear antibody) 02/14/2012  . Sarcoid ([place])    including hand per rheumatology-Dr. Amil Amen  . Sarcoidosis of lung ([place])   . Shortness of breath    on exertion  . Varicose veins with pain    Past Surgical History:  Procedure Laterality Date  . ABDOMINAL HYSTERECTOMY    . COLONOSCOPY WITH PROPOFOL N/A 10/19/2016   Procedure: COLONOSCOPY WITH PROPOFOL;  Surgeon: Mauri Pole, MD;  [facility];  Service: Endoscopy;  Laterality: N/A;  . ESOPHAGOGASTRODUODENOSCOPY (EGD) WITH PROPOFOL N/A 10/19/2016   Procedure: ESOPHAGOGASTRODUODENOSCOPY (EGD) WITH PROPOFOL;  Surgeon: Mauri Pole,  MD;  Location[facility];  Service: Endoscopy;  Laterality: N/A;  . HERNIA MESH REMOVAL  02/2013  . uterine ablation  03/2010  . WISDOM TOOTH EXTRACTION     Social History   Social History  . Marital status: Single    Spouse name: N/A  . Number of children: 0  . Years of education: N/A   Occupational History  . personal shopper    Social History Main Topics  . Smoking status: Former Smoker    Packs/day: 1.00    Years: 20.00    Types: Cigarettes    Quit date: 10/25/2012  . Smokeless tobacco: Never Used     Comment: QUIT 04/2010 AND STARTED BACK 2014 X 3 MONTHS. less than 1 ppd.  started at age 63.    Marland Kitchen Alcohol use No  . Drug use: No  . Sexual activity: Yes    Birth control/ protection: None   Other Topics Concern  . None   Social History Narrative   Pt lives with [name]- also pt of [facility]   Allergies  Allergen Reactions  . Azithromycin Hives    (z pak) hives  . Bee Venom Anaphylaxis  . Flagyl [Metronidazole] Hives and Shortness Of Breath  . Penicillins Shortness Of Breath and Swelling    Has patient had a PCN reaction causing immediate rash, facial/tongue/throat swelling, SOB or lightheadedness with hypotension: Yes Has patient had a PCN reaction causing severe  rash involving mucus membranes or skin necrosis: No Has patient had a PCN reaction that required hospitalization No Has patient had a PCN reaction occurring within the last 10 years: No If all of the above answers are "NO", then may proceed with Cephalosporin use.  REACTION: swelling and difficulty breathing  . Shellfish Allergy Anaphylaxis  . Klonopin [Clonazepam] Other (See Comments)    Memory difficulty   Family History  Problem Relation Age of Onset  . Allergies Mother   . Heart attack Mother   . Diabetes Mother   . Diabetes Father   . Heart disease Father   . Rheum arthritis Father   . Stroke Father   . Hypertension    . Ovarian cancer Maternal Aunt   . Lung cancer Maternal Aunt   . Breast  cancer Maternal Aunt   . Bone cancer Maternal Aunt   . Stroke Maternal Uncle   . Other Neg Hx     Past medical history, social, surgical and family history all reviewed in electronic medical record.  No pertanent information unless stated regarding to the chief complaint.   Review of Systems: No headache, visual changes, nausea, vomiting, diarrhea, constipation, dizziness, abdominal pain, skin rash, fevers, chills, night sweats, weight loss, swollen lymph nodes, body aches, joint swelling, muscle aches, chest pain, shortness of breath, mood changes.   .   Objective  Blood pressure (!) 154/110, pulse 100, height 5\' 6"  (1.676 m), weight (!) 326 lb (147.9 kg), last menstrual period 07/17/2012.   Systems examined below as of 12/01/16 General: NAD A&O x3 mood, affect normal  HEENT: Pupils equal, extraocular movements intact no nystagmus Respiratory: not short of breath at rest or with speaking Cardiovascular: No lower extremity edema, non tender Skin: Warm dry intact with no signs of infection or rash on extremities or on axial skeleton. Abdomen: Soft nontender, no masses Neuro: Cranial nerves  intact, neurovascularly intact in all extremities with 2+ DTRs and 2+ pulses. Lymph: No lymphadenopathy appreciated today  Gait Antalgic gait MSK: Non tender with full range of motion and good stability and symmetric strength and tone of shoulders, elbows, wrist,  knee hips and ankles bilaterally.   Ankle: Right Pes planus noted in severe. Continue mild weakness with resisted eversion of the ankle compared to contralateral sign Stable lateral and medial ligaments; squeeze test and kleiger test unremarkable; Talar dome nontender; No pain at base of 5th MT; No tenderness over cuboid; No tenderness over N spot or navicular prominence Increasing pain of the peroneal tendons as well as tenderness over the posterior aspect of the lateral malleolus mild pain over the dorsal aspect of the foot over the  fourth in the insertion of the peroneal tendon. Able to walk 4 steps.   MSK US performed of: Right ankle This study was ordered, performed, and interpreted by Charlann Boxer D.O.  Foot/Ankle:   All structures visualized.   Talar dome unremarkable  Ankle mortise without effusion. Peroneus longus and brevis tendons show mild hypoechoic changes. At the insertion of the peroneal brevis that the fourth metatarsal area patient does have an avulsion fracture noted. Hypoechoic changes noted. Patient also has what appears to be a very small stress fracture versus possible cortical defect of the inferior posterior aspect of the lateral malleolus. Increasing dorsal Doppler flow noted  IMPRESSION:  Lateral avulsion fracture seems lateral malleolus and the fourth metatarsal.     Impression and Recommendations:     This case required medical decision making of moderate complexity.  Note: This dictation was prepared with Dragon dictation along with smaller phrase technology. Any transcriptional errors that result from this process are unintentional.

## 2016-12-01 NOTE — Assessment & Plan Note (Signed)
Patient did have more of an avulsion fracture of the right ankle. We discussed some bracing. Patient will be put in a postop shoe for a longer period of time. Discussed that this may take some time with her previous history. Follow-up again in 3-4 weeks. Will likely need custom orthotics at some point.

## 2016-12-03 ENCOUNTER — Ambulatory Visit: Payer: Self-pay | Admitting: Family

## 2016-12-07 DIAGNOSIS — Z6841 Body Mass Index (BMI) 40.0 and over, adult: Secondary | ICD-10-CM | POA: Diagnosis not present

## 2016-12-07 DIAGNOSIS — M533 Sacrococcygeal disorders, not elsewhere classified: Secondary | ICD-10-CM | POA: Diagnosis not present

## 2016-12-14 ENCOUNTER — Ambulatory Visit: Payer: Self-pay | Admitting: Internal Medicine

## 2016-12-20 ENCOUNTER — Ambulatory Visit: Payer: Self-pay | Admitting: Gastroenterology

## 2016-12-21 ENCOUNTER — Other Ambulatory Visit: Payer: Self-pay | Admitting: Internal Medicine

## 2016-12-21 ENCOUNTER — Other Ambulatory Visit: Payer: Self-pay | Admitting: Family Medicine

## 2016-12-21 ENCOUNTER — Other Ambulatory Visit: Payer: Self-pay | Admitting: Gastroenterology

## 2016-12-22 ENCOUNTER — Ambulatory Visit (INDEPENDENT_AMBULATORY_CARE_PROVIDER_SITE_OTHER): Payer: BLUE CROSS/BLUE SHIELD | Admitting: Family Medicine

## 2016-12-22 ENCOUNTER — Encounter: Payer: Self-pay | Admitting: Family Medicine

## 2016-12-22 ENCOUNTER — Ambulatory Visit: Payer: Self-pay

## 2016-12-22 VITALS — BP 138/84 | HR 105 | Ht 66.0 in | Wt 322.0 lb

## 2016-12-22 DIAGNOSIS — S82891A Other fracture of right lower leg, initial encounter for closed fracture: Secondary | ICD-10-CM | POA: Diagnosis not present

## 2016-12-22 MED ORDER — ALENDRONATE SODIUM 70 MG PO TABS: 70.0000 mg | ORAL_TABLET | ORAL | 11 refills | Status: DC

## 2016-12-22 NOTE — Assessment & Plan Note (Signed)
Patient is making progress but seems to be slow. With patient's other comorbidities treatment seems to be in difficult. Patient does have sarcoidosis, diabetes, and long-term use of prednisone. Patient is on pain medications from another provider and now to help her with pain. Unable to take time off of work secondary to financial constraints. Discussed with patient about wearing the Cam Walker for another week and then advance into a postop shoe. Patient and will slowly increase wear a regular shoe. Continue the home exercises. Started on Fosamax to aid in bone healing. We discussed being supple and tissue vitamin D but monitor for any signs of hypercalcemia. Patient will continue to be active and follow-up with me again in 4 weeks.

## 2016-12-22 NOTE — Patient Instructions (Addendum)
Good to see you  Ice 20 minutes 2 times daily. Usually after activity and before bed. It does appear to be healing.  Fosamax week with the vitamin D but take it in the morning.  Boot for 1 more week, then the fashion shoe for 2 weeks.  Then shoe in the 4th week.  Keep coming out of the boot and moving the ankle when sitting.  See me again in 4 weeks.

## 2016-12-22 NOTE — Telephone Encounter (Signed)
Refill done.  

## 2016-12-22 NOTE — Progress Notes (Signed)
[name] [place], [place] [address] Phone: [phone] Subjective:    I'm seeing this patient by the request  of:  Cathlean Cower, MD   CC: Right ankle pain F/U  [id number]  Nicole Melendez is a 41 y.o. female coming in with complaint of right ankle pain. Past family history significant for spinal stenosis as well as sarcoidosis. Patient was found to have an avulsion fracture of the foot near the peroneal tendon. Patient was to do bracing, icing regimen, home exercises. Patient was to avoid significant ambulation of the ankle as possible. Patient states unfortunately she continues to have the work on a regular basis. Patient states that it is improving but very slowly. Patient states that unfortunately she is taking pain medications regularly for her back pain as well. Patient is unable to do any epidural secondary to her diabetes. Patient feels that her sarcoid seems to be under control but she is concerned she is having a potential flare but does not have any shortness of breath.     Past Medical History:  Diagnosis Date  . Allergic rhinitis, cause unspecified   . Anemia   . Anxiety   . Depression   . Diabetes mellitus type II    steroid related, patient states "im no longer diabetic"  . Essential hypertension 08/08/2015  . GERD (gastroesophageal reflux disease)   . History of blood transfusion   . Hyperlipidemia   . Menorrhagia   . Migraine   . Morbid obesity ([place])   . Otitis media 06/28/2015  . Peptic ulcer disease   . Positive ANA (antinuclear antibody) 02/14/2012  . Sarcoid ([place])    including hand per rheumatology-Dr. Amil Amen  . Sarcoidosis of lung ([place])   . Shortness of breath    on exertion  . Varicose veins with pain    Past Surgical History:  Procedure Laterality Date  . ABDOMINAL HYSTERECTOMY    . COLONOSCOPY WITH PROPOFOL N/A 10/19/2016   Procedure: COLONOSCOPY WITH PROPOFOL;  Surgeon: Mauri Pole, MD;   Location: [facility];  Service: Endoscopy;  Laterality: N/A;  . ESOPHAGOGASTRODUODENOSCOPY (EGD) WITH PROPOFOL N/A 10/19/2016   Procedure: ESOPHAGOGASTRODUODENOSCOPY (EGD) WITH PROPOFOL;  Surgeon: Mauri Pole, MD;  Location: [facility];  Service: Endoscopy;  Laterality: N/A;  . HERNIA MESH REMOVAL  02/2013  . uterine ablation  03/2010  . WISDOM TOOTH EXTRACTION     Social History   Social History  . Marital status: Single    Spouse name: N/A  . Number of children: 0  . Years of education: N/A   Occupational History  . personal shopper    Social History Main Topics  . Smoking status: Former Smoker    Packs/day: 1.00    Years: 20.00    Types: Cigarettes    Quit date: 10/25/2012  . Smokeless tobacco: Never Used     Comment: QUIT 04/2010 AND STARTED BACK 2014 X 3 MONTHS. less than 1 ppd.  started at age 65.    Marland Kitchen Alcohol use No  . Drug use: No  . Sexual activity: Yes    Birth control/ protection: None   Other Topics Concern  . None   Social History Narrative   Pt lives with [name]- also pt of [facility]   Allergies  Allergen Reactions  . Azithromycin Hives    (z pak) hives  . Bee Venom Anaphylaxis  . Flagyl [Metronidazole] Hives and Shortness Of Breath  . Penicillins Shortness Of  Breath and Swelling    Has patient had a PCN reaction causing immediate rash, facial/tongue/throat swelling, SOB or lightheadedness with hypotension: Yes Has patient had a PCN reaction causing severe rash involving mucus membranes or skin necrosis: No Has patient had a PCN reaction that required hospitalization No Has patient had a PCN reaction occurring within the last 10 years: No If all of the above answers are "NO", then may proceed with Cephalosporin use.  REACTION: swelling and difficulty breathing  . Shellfish Allergy Anaphylaxis  . Klonopin [Clonazepam] Other (See Comments)    Memory difficulty   Family History  Problem Relation Age of Onset  . Allergies Mother   . Heart  attack Mother   . Diabetes Mother   . Diabetes Father   . Heart disease Father   . Rheum arthritis Father   . Stroke Father   . Hypertension    . Ovarian cancer Maternal Aunt   . Lung cancer Maternal Aunt   . Breast cancer Maternal Aunt   . Bone cancer Maternal Aunt   . Stroke Maternal Uncle   . Other Neg Hx     Past medical history, social, surgical and family history all reviewed in electronic medical record.  No pertanent information unless stated regarding to the chief complaint.   Review of Systems: No headache, visual changes, nausea, vomiting, diarrhea, constipation, dizziness, abdominal pain, skin rash, fevers, chills, night sweats, weight loss, swollen lymph nodes, chest pain, shortness of breath, mood changes.  Positive muscle aches, joint swelling, body aches..   Objective  Blood pressure 138/84, pulse (!) 105, height 5\' 6"  (1.676 m), weight (!) 322 lb (146.1 kg), last menstrual period 07/17/2012, SpO2 99 %.   Systems examined below as of 12/22/16 General: NAD A&O x3 mood, affect normal obese.  HEENT: Pupils equal, extraocular movements intact no nystagmus Respiratory: not short of breath at rest or with speaking Cardiovascular: No lower extremity edema, non tender Skin: Warm dry intact with no signs of infection or rash on extremities or on axial skeleton. Abdomen: Soft nontender, no masses obese.  Neuro: Cranial nerves  intact, neurovascularly intact in all extremities with 2+ DTRs and 2+ pulses. Lymph: No lymphadenopathy appreciated today  Gait antalgic MSK: Non tender with full range of motion and good stability and symmetric strength and tone of shoulders, elbows, wrist,  knee hips and bilaterally.  .   Ankle: Right Pes planus noted in severe. Patient does have good range of motion but still has pain with resisted eversion of the ankle n Stable lateral and medial ligaments; squeeze test and kleiger test unremarkable; Talar dome laterally tender which is new from  previous exam No pain at base of 5th MT; No tenderness over cuboid; No tenderness over N spot or navicular prominence Barry mild tenderness at the insertion of the peroneals on the fifth metatarsal as well as just posterior and inferior to the lateral malleolus. Able to walk 4 steps.   MSK US performed of: Right ankle This study was ordered, performed, and interpreted by Charlann Boxer D.O.  Foot/Ankle:   Patient's midfoot does show a small synovitis from toes 2 through 4. Avulsion fracture that was noted over the fourth and fifth metatarsals previously and does have good callus formation but still seems to be a soft callus and set of hard. Lateral malleolus looks relatively normal. Peroneal tendons minimal swelling.  IMPRESSION:  Healing of the lateral malleolus and fourth metatarsal but possible delayed healing    Impression and Recommendations:  This case required medical decision making of moderate complexity.      Note: This dictation was prepared with Dragon dictation along with smaller phrase technology. Any transcriptional errors that result from this process are unintentional.

## 2016-12-23 ENCOUNTER — Encounter: Payer: Self-pay | Admitting: Family Medicine

## 2016-12-28 ENCOUNTER — Telehealth: Payer: Self-pay | Admitting: Internal Medicine

## 2016-12-28 NOTE — Telephone Encounter (Signed)
Pt is requesting copy of doctors notes from her visit on 2/28. Someone will come pick it up tomorrow. If you need to call her, please call after 2:30

## 2016-12-28 NOTE — Telephone Encounter (Signed)
OV notes are at front desk ready to be picked up.

## 2016-12-29 ENCOUNTER — Encounter: Payer: Self-pay | Admitting: Emergency Medicine

## 2016-12-29 ENCOUNTER — Ambulatory Visit (INDEPENDENT_AMBULATORY_CARE_PROVIDER_SITE_OTHER): Payer: BLUE CROSS/BLUE SHIELD | Admitting: Emergency Medicine

## 2016-12-29 DIAGNOSIS — D869 Sarcoidosis, unspecified: Secondary | ICD-10-CM

## 2016-12-29 MED ORDER — PREDNISONE 10 MG PO TABS: ORAL_TABLET | ORAL | 0 refills | Status: DC

## 2016-12-29 NOTE — Addendum Note (Signed)
Addended by: Jannette Spanner on: 12/29/2016 04:15 PM   Modules accepted: Orders

## 2016-12-29 NOTE — Progress Notes (Signed)
Subjective:    Patient ID: Nicole Melendez, female    DOB: 06-30-1976, 41 y.o.   MRN: [id number]  HPI 41 year old woman with a history of sarcoidosis and former tobacco use (20 pack years), diabetes, GERD, hypertension. Followed in the past by Nicole Melendez and had been managed on Symbicort (stopped last time), methotrexate, and daily prednisone. Current prednisone dose is 5mg . She has been seen at the end of October and also on 09/02/15 by Nicole. Nicole Melendez for progressive dyspnea that was treated with corticosteroids and Levaquin, completed 3 weeks ago.  She underwent pulmonary function testing today that I personally reviewed. This shows grossly normal airflows without a bronchodilator response, now restriction based on a slightly decreased TLC, and a decreased diffusion capacity that corrects to normal range for alveolar volume.   Having nasal drainage, some green. Cough is a bit better than last month, but now producing colored mucous. She is on zyrtec, has stopped the flonase nasal spray. She is doing nasal saline spray. She gets regular eye exams.   ROV 11/26/15 -- patient follows up for her history of sarcoidosis and former tobacco use. She has been managed on methotrexate and daily prednisone. Her current dose is. She is having some nasal drainage and congestion, still on flonase and zyrtec.  She is on symbicort bid, rarely needs xopenex prn.  On stable dose MTX 10mg  per Nicole Melendez. She was treated recently with a burst of prednisone for Low back neuropathic pain, now back to 5mg  daily.   ROV 06/30/16 -- Patient follows up today for history of sarcoidosis for which she is managed on methotrexate 6mg  only, off maintenance pred. She is on Symbicort twice a day. She also takes nasal washes, Fluticasone and Zyrtec for allergic rhinitis. She has had URI sx since about 10-12 days ago. She was treated with prednisone taper and cipro about a weeks ago. She needs refill symbicort, xopenex. She still has nasal  congestion, some dyspnea, cough with yellow / green. She still feels run down. She feels like she may have thrush  ROV 12/29/16 -- follow up for sarcoidosis, on MTX 6mg  weekly per Nicole Melendez, Symbicort. She is having some joint pain L hip, B ankles. She has noticed a slight papular rash on medial surfaces of B ankles, brownish, minimally pruritic. Last Ct chest was done 09/2015.   Review of Systems  As per HPI     Objective:   Physical Exam  Vitals:   12/29/16 1522  BP: 126/80  Pulse: 92  SpO2: 97%  Weight: (!) 320 lb (145.2 kg)  Height: 5\' 7"  (1.702 m)   Gen: Pleasant, overweight, in no distress,  normal affect  ENT: No lesions,  mouth clear,  oropharynx clear, no postnasal drip  Neck: No JVD, no stridor  Lungs: No use of accessory muscles, clear without rales or rhonchi, no wheeze on forced expiration  Cardiovascular: RRR, heart sounds normal, no murmur or gallops, no peripheral edema  Musculoskeletal: No deformities, no cyanosis or clubbing  Neuro: alert, non focal  Skin: Warm, no lesions or rashes    10/06/15 --  COMPARISON: 05/13/2010.  FINDINGS: Mediastinum/Lymph Nodes: Mediastinal lymph nodes are not enlarged by CT size criteria. Hilar regions are difficult to definitively evaluate without IV contrast. No axillary adenopathy. Heart size normal. No pericardial effusion.  Lungs/Pleura: Image quality is somewhat degraded by body habitus. Mild patchy upper and midlung zone predominant ground-glass with slight architectural distortion. A corresponding acute infectious/ inflammatory process was seen in  the upper/ mid lung zones on 05/13/2010. No pleural fluid. Airway is unremarkable.  Upper abdomen: Visualized portions of the liver, left adrenal gland, left kidney, spleen and pancreas are grossly unremarkable. Postoperative changes in the stomach.  Musculoskeletal: No worrisome lytic or sclerotic lesions.  IMPRESSION: 1. Upper/midlung zone predominant  ground-glass and mild architectural distortion may represent the residua of acute changes of sarcoid seen on 05/13/2010. Post infectious scarring can also have this appearance. 2. Mediastinal lymph nodes are within normal limits.      Assessment & Plan:  PULMONARY SARCOIDOSIS Joint pain and ankle rash, unclear whether this relates to her sarcoidosis. She Is not experiencing dyspnea. Will try treating with a pred burst, see which sx improve. May need to further eval the arthralgias with Nicole Melendez. Continue mTX. Follow in 1 montrh to assess status after the pred .   Baltazar Apo, MD, PhD 12/29/2016, 4:08 PM [facility] [phone] or if no answer [phone]

## 2016-12-29 NOTE — Patient Instructions (Addendum)
Please take prednisone taper as follows > Take 40mg  daily for 3 days, then 30mg  daily for 3 days, then 20mg  daily for 3 days, then 10mg  daily for 3 days, then stop Please continue your Symbicort 2 puffs twice a day. Get your medication formulary and we will evaluate it to see if there is a less expensive alternative to Symbicort.  We will repeat your CT chest in December 2018 to evaluate sarcoidosis.  Follow with Dr Lamonte Sakai in 1 month

## 2016-12-29 NOTE — Assessment & Plan Note (Signed)
Joint pain and ankle rash, unclear whether this relates to her sarcoidosis. She Is not experiencing dyspnea. Will try treating with a pred burst, see which sx improve. May need to further eval the arthralgias with Dr Amil Amen. Continue mTX. Follow in 1 montrh to assess status after the pred .

## 2017-01-04 ENCOUNTER — Encounter: Payer: Self-pay | Admitting: Internal Medicine

## 2017-01-04 ENCOUNTER — Ambulatory Visit (INDEPENDENT_AMBULATORY_CARE_PROVIDER_SITE_OTHER): Payer: BLUE CROSS/BLUE SHIELD | Admitting: Internal Medicine

## 2017-01-04 VITALS — BP 130/70 | HR 87 | Temp 98.2°F | Ht 67.0 in | Wt 324.0 lb

## 2017-01-04 DIAGNOSIS — B370 Candidal stomatitis: Secondary | ICD-10-CM

## 2017-01-04 DIAGNOSIS — E1140 Type 2 diabetes mellitus with diabetic neuropathy, unspecified: Secondary | ICD-10-CM | POA: Diagnosis not present

## 2017-01-04 DIAGNOSIS — E785 Hyperlipidemia, unspecified: Secondary | ICD-10-CM

## 2017-01-04 DIAGNOSIS — Z794 Long term (current) use of insulin: Secondary | ICD-10-CM

## 2017-01-04 DIAGNOSIS — I10 Essential (primary) hypertension: Secondary | ICD-10-CM

## 2017-01-04 MED ORDER — FLUCONAZOLE 100 MG PO TABS: 100.0000 mg | ORAL_TABLET | Freq: Every day | ORAL | 0 refills | Status: DC

## 2017-01-04 MED ORDER — NYSTATIN 100000 UNIT/ML MT SUSP: 500000.0000 [IU] | Freq: Four times a day (QID) | OROMUCOSAL | 5 refills | Status: AC

## 2017-01-04 MED ORDER — LIDOCAINE VISCOUS 2 % MT SOLN: 20.0000 mL | OROMUCOSAL | 1 refills | Status: DC | PRN

## 2017-01-04 MED ORDER — NYSTATIN 100000 UNIT/ML MT SUSP: 500000.0000 [IU] | Freq: Four times a day (QID) | OROMUCOSAL | 5 refills | Status: DC

## 2017-01-04 NOTE — Progress Notes (Signed)
Subjective:    Patient ID: Nicole Melendez, female    DOB: 04-07-1976, 41 y.o.   MRN: [id number]  HPI   Here with 1 wk worsening dorsal tongue thrush with discomfort, still tx with symbicort and more recently prednisone for sarcoid with recent skin manifestations, also on MTX,   Pt denies chest pain, increased sob or doe, wheezing, orthopnea, PND, increased LE swelling, palpitations, dizziness or syncope.  Pt denies new neurological symptoms such as new headache, or facial or extremity weakness or numbness   Pt denies polydipsia, polyuria, .  Pt states overall good compliance with meds, trying to follow lower cholesterol, diabetic diet, wt overall stable but little exercise however.   Wt Readings from Last 3 Encounters:  01/04/17 (!) 324 lb (147 kg)  12/29/16 (!) 320 lb (145.2 kg)  12/22/16 (!) 322 lb (146.1 kg)   BP Readings from Last 3 Encounters:  01/04/17 130/70  12/29/16 126/80  12/22/16 138/84   Past Medical History:  Diagnosis Date  . Allergic rhinitis, cause unspecified   . Anemia   . Anxiety   . Depression   . Diabetes mellitus type II    steroid related, patient states "im no longer diabetic"  . Essential hypertension 08/08/2015  . GERD (gastroesophageal reflux disease)   . History of blood transfusion   . Hyperlipidemia   . Menorrhagia   . Migraine   . Morbid obesity ([place])   . Otitis media 06/28/2015  . Peptic ulcer disease   . Positive ANA (antinuclear antibody) 02/14/2012  . Sarcoid ([place])    including hand per rheumatology-Dr. Amil Amen  . Sarcoidosis of lung ([place])   . Shortness of breath    on exertion  . Varicose veins with pain    Past Surgical History:  Procedure Laterality Date  . ABDOMINAL HYSTERECTOMY    . COLONOSCOPY WITH PROPOFOL N/A 10/19/2016   Procedure: COLONOSCOPY WITH PROPOFOL;  Surgeon: Mauri Pole, MD;  Location: [facility];  Service: Endoscopy;  Laterality: N/A;  . ESOPHAGOGASTRODUODENOSCOPY (EGD) WITH PROPOFOL N/A 10/19/2016   Procedure: ESOPHAGOGASTRODUODENOSCOPY (EGD) WITH PROPOFOL;  Surgeon: Mauri Pole, MD;  Location: [facility];  Service: Endoscopy;  Laterality: N/A;  . HERNIA MESH REMOVAL  02/2013  . uterine ablation  03/2010  . WISDOM TOOTH EXTRACTION      reports that she quit smoking about 4 years ago. Her smoking use included Cigarettes. She has a 20.00 pack-year smoking history. She has never used smokeless tobacco. She reports that she does not drink alcohol or use drugs. family history includes Allergies in her mother; Bone cancer in her maternal aunt; Breast cancer in her maternal aunt; Diabetes in her father and mother; Heart attack in her mother; Heart disease in her father; Lung cancer in her maternal aunt; Ovarian cancer in her maternal aunt; Rheum arthritis in her father; Stroke in her father and maternal uncle. Allergies  Allergen Reactions  . Azithromycin Hives    (z pak) hives  . Bee Venom Anaphylaxis  . Flagyl [Metronidazole] Hives and Shortness Of Breath  . Penicillins Shortness Of Breath and Swelling    Has patient had a PCN reaction causing immediate rash, facial/tongue/throat swelling, SOB or lightheadedness with hypotension: Yes Has patient had a PCN reaction causing severe rash involving mucus membranes or skin necrosis: No Has patient had a PCN reaction that required hospitalization No Has patient had a PCN reaction occurring within the last 10 years: No If all of the above answers are "NO", then may  proceed with Cephalosporin use.  REACTION: swelling and difficulty breathing  . Shellfish Allergy Anaphylaxis  . Klonopin [Clonazepam] Other (See Comments)    Memory difficulty   Current Outpatient Prescriptions on File Prior to Visit  Medication Sig Dispense Refill  . acetaminophen (TYLENOL) 500 MG tablet Take 1,000 mg by mouth every 6 (six) hours as needed for mild pain or moderate pain.    Marland Kitchen alendronate (FOSAMAX) 70 MG tablet Take 1 tablet (70 mg total) by mouth every 7  (seven) days. 4 tablet 11  . ALPRAZolam (XANAX) 0.5 MG tablet Take 1 tablet (0.5 mg total) by mouth 3 (three) times daily as needed. (Patient taking differently: Take 0.5 mg by mouth 3 (three) times daily as needed for anxiety. ) 90 tablet 2  . azelastine (ASTELIN) 0.1 % nasal spray Place 2 sprays into both nostrils 2 (two) times daily. Use in each nostril as directed 30 mL 12  . Blood Glucose Monitoring Suppl (ONE TOUCH ULTRA 2) W/DEVICE KIT Use as directed four times daily. Dx: E11.9 1 each 0  . budesonide-formoterol (SYMBICORT) 160-4.5 MCG/ACT inhaler Inhale 2 puffs into the lungs 2 (two) times daily. 1 Inhaler 5  . cetirizine (ZYRTEC) 10 MG tablet TAKE 1 TABLET(10 MG) BY MOUTH DAILY 90 tablet 0  . cyclobenzaprine (FLEXERIL) 5 MG tablet Take 1 tablet (5 mg total) by mouth at bedtime as needed for muscle spasms. (Patient taking differently: Take 10 mg by mouth at bedtime as needed for muscle spasms. ) 30 tablet 3  . folic acid (FOLVITE) 1 MG tablet TAKE 2 TABLETS BY MOUTH DAILY 180 tablet 3  . furosemide (LASIX) 40 MG tablet 1 tab by mouth every day in the AM and then 1 as needed only for persistent swelling in the PM 60 tablet 11  . gabapentin (NEURONTIN) 300 MG capsule TAKE 1 CAPSULE(300 MG) BY MOUTH THREE TIMES DAILY 90 capsule 5  . glucose blood (ONE TOUCH ULTRA TEST) test strip Use to check blood sugars four times a day Dx E11.9 400 each 11  . HUMALOG 100 UNIT/ML injection INJECT UP TO 12 UNITS INTO THE SKIN THREE TIMES DAILY WITH MEALS WITH SLIDING SCALE AS DIRECTED (Patient taking differently: Inject 2 to 12 units 3 times a day as needed for high blood sugar) 10 mL 0  . Insulin Syringe-Needle U-100 25G X 1" 1 ML MISC Use to administer humalog insulin three times a day Dx E11.9 100 each 5  . levalbuterol (XOPENEX HFA) 45 MCG/ACT inhaler Inhale 1-2 puffs into the lungs every 8 (eight) hours as needed for wheezing or shortness of breath. 1 Inhaler 5  . methotrexate (RHEUMATREX) 2.5 MG tablet Take  15 mg by mouth every Tuesday.     . ONE TOUCH ULTRA TEST test strip TEST BLOOD SUGAR FOUR TIMES DAILY 400 each 0  . pantoprazole (PROTONIX) 40 MG tablet Take 1 tablet (40 mg total) by mouth 2 (two) times daily before a meal. 90 tablet 3  . potassium chloride (K-DUR) 10 MEQ tablet 3 tab by mouth per day only when taking the lasix (Patient taking differently: Take 10-20 mEq by mouth 2 (two) times daily. Take 10 meq in the morning and 97mq at night) 90 tablet 11  . predniSONE (DELTASONE) 10 MG tablet Take 4 tabs by mouth for 3 days, then 3 for 3 days, 2 for 3 days, 1 for 3 days and stop 30 tablet 0  . ranitidine (ZANTAC) 150 MG tablet TAKE 1 TABLET(150 MG) BY MOUTH  AT BEDTIME 30 tablet 0  . sertraline (ZOLOFT) 100 MG tablet TAKE 2 TABLETS BY MOUTH DAILY 180 tablet 0  . SUMAtriptan (IMITREX) 100 MG tablet Take 1 tablet by mouth at the onset of a headache. May repeat in 2 hours if headache persists or recurs. 10 tablet 2  . telmisartan (MICARDIS) 40 MG tablet Take 1 tablet (40 mg total) by mouth daily. 90 tablet 3  . TURMERIC PO Take 1 tablet by mouth daily.    . vitamin B-12 (CYANOCOBALAMIN) 1000 MCG tablet Take 1,000 mcg by mouth daily.    . Vitamin D, Ergocalciferol, (DRISDOL) 50000 units CAPS capsule TAKE 1 CAPSULE BY MOUTH EVERY 7 DAYS 12 capsule 0  . zonisamide (ZONEGRAN) 100 MG capsule Take 1 capsule (100 mg total) by mouth daily. 30 capsule 2   No current facility-administered medications on file prior to visit.    Review of Systems  Constitutional: Negative for unusual diaphoresis or night sweats HENT: Negative for ear swelling or discharge Eyes: Negative for worsening visual haziness  Respiratory: Negative for choking and stridor.   Gastrointestinal: Negative for distension or worsening eructation Genitourinary: Negative for retention or change in urine volume.  Musculoskeletal: Negative for other MSK pain or swelling Skin: Negative for color change and worsening wound Neurological:  Negative for tremors and numbness other than noted  Psychiatric/Behavioral: Negative for decreased concentration or agitation other than above   All other system neg per pt    Objective:   Physical Exam BP 130/70 (BP Location: Right Arm, Patient Position: Sitting, Cuff Size: Normal)   Pulse 87   Temp 98.2 F (36.8 C) (Oral)   Ht '5\' 7"'  (1.702 m)   Wt (!) 324 lb (147 kg)   LMP 07/17/2012   SpO2 99%   BMI 50.75 kg/m  VS noted, not ill appearing Constitutional: Pt appears in no apparent distress HENT: Head: NCAT.  Right Ear: External ear normal.  Left Ear: External ear normal.  Bilat tm's with mild erythema.  Max sinus areas non tender.  Pharynx with mild erythema, no exudate Tongue: with + dorsal white thrush Eyes: . Pupils are equal, round, and reactive to light. Conjunctivae and EOM are normal Neck: Normal range of motion. Neck supple.  Cardiovascular: Normal rate and regular rhythm.   Pulmonary/Chest: Effort normal and breath sounds decreased without rales or wheezing.  Neurological: Pt is alert. Not confused , motor grossly intact Skin: Skin is warm. No rash, no LE edema Psychiatric: Pt behavior is normal. No agitation.  No other new exam findings    Assessment & Plan:

## 2017-01-04 NOTE — Progress Notes (Signed)
Pre visit review using our clinic review tool, if applicable. No additional management support is needed unless otherwise documented below in the visit note. 

## 2017-01-04 NOTE — Patient Instructions (Signed)
Please take all new medication as prescribed - the diflucan, nystatin solution, and the viscous lidocaine for pain  Please continue all other medications as before, and refills have been done if requested.  Please have the pharmacy call with any other refills you may need.  Please keep your appointments with your specialists as you may have planned

## 2017-01-05 ENCOUNTER — Encounter (HOSPITAL_COMMUNITY): Payer: Self-pay | Admitting: *Deleted

## 2017-01-07 ENCOUNTER — Telehealth: Payer: Self-pay | Admitting: *Deleted

## 2017-01-07 ENCOUNTER — Encounter (HOSPITAL_COMMUNITY): Payer: Self-pay | Admitting: Certified Registered Nurse Anesthetist

## 2017-01-07 MED ORDER — PROPOFOL 10 MG/ML IV BOLUS
INTRAVENOUS | Status: AC
  Filled 2017-01-07: qty 40

## 2017-01-07 NOTE — Telephone Encounter (Signed)
Called patient to inform she has missed her appt at [facility] for her procedure to call us back  And reschedule

## 2017-01-09 NOTE — Assessment & Plan Note (Signed)
stable overall by history and exam, recent data reviewed with pt, and pt to continue medical treatment as before,  to f/u any worsening symptoms or concerns Lab Results  Component Value Date   HGBA1C 6.3 06/16/2016

## 2017-01-09 NOTE — Assessment & Plan Note (Signed)
stable overall by history and exam, recent data reviewed with pt, and pt to continue medical treatment as before,  to f/u any worsening symptoms or concerns BP Readings from Last 3 Encounters:  01/04/17 130/70  12/29/16 126/80  12/22/16 138/84

## 2017-01-09 NOTE — Assessment & Plan Note (Signed)
stable overall by history and exam, recent data reviewed with pt, and pt to continue medical treatment as before,  to f/u any worsening symptoms or concerns Lab Results  Component Value Date   LDLCALC 108 (H) 06/16/2016

## 2017-01-09 NOTE — Assessment & Plan Note (Addendum)
Mild to mod, for nystatin, diflucan, and xylocaine use prn,  to f/u any worsening symptoms or concerns

## 2017-01-16 NOTE — Progress Notes (Signed)
[name] [address] [place], [place] [address] Phone: [phone] Subjective:    I'm seeing this patient by the request  of:  Cathlean Cower, MD   CC: Right ankle pain F/U  GYK:[id number]  Nicole Melendez is a 41 y.o. female coming in with complaint of right ankle pain. Past family history significant for spinal stenosis as well as sarcoidosis. Patient was found to have an avulsion fracture of the foot near the peroneal tendon.  Patient is transitioned back into shoes. Not wearing the brace regularly. States that it is doing much better. Still has some weakness when trying to push her foot externally rotated or eversion. Patient states otherwise seems to be doing very well. No swelling, no numbness. Feels that she has made some progress but does not want to have a reinjury if possible..     Past Medical History:  Diagnosis Date  . Allergic rhinitis, cause unspecified   . Anemia   . Anxiety   . Depression   . Diabetes mellitus type II    steroid related, patient states "im no longer diabetic"  . Essential hypertension 08/08/2015  . GERD (gastroesophageal reflux disease)   . History of blood transfusion   . Hyperlipidemia   . Menorrhagia   . Migraine   . Morbid obesity ([place])   . Otitis media 06/28/2015  . Peptic ulcer disease   . Positive ANA (antinuclear antibody) 02/14/2012  . Sarcoid ([place])    including hand per rheumatology-Dr. Amil Amen  . Sarcoidosis of lung ([place])   . Shortness of breath    on exertion  . Varicose veins with pain    Past Surgical History:  Procedure Laterality Date  . ABDOMINAL HYSTERECTOMY    . COLONOSCOPY WITH PROPOFOL N/A 10/19/2016   Procedure: COLONOSCOPY WITH PROPOFOL;  Surgeon: Mauri Pole, MD;  Location: [facility];  Service: Endoscopy;  Laterality: N/A;  . ESOPHAGOGASTRODUODENOSCOPY (EGD) WITH PROPOFOL N/A 10/19/2016   Procedure: ESOPHAGOGASTRODUODENOSCOPY (EGD) WITH PROPOFOL;  Surgeon: Mauri Pole,  MD;  Location: [facility];  Service: Endoscopy;  Laterality: N/A;  . HERNIA MESH REMOVAL  02/2013  . uterine ablation  03/2010  . WISDOM TOOTH EXTRACTION     Social History   Social History  . Marital status: Single    Spouse name: N/A  . Number of children: 0  . Years of education: N/A   Occupational History  . personal shopper    Social History Main Topics  . Smoking status: Former Smoker    Packs/day: 1.00    Years: 20.00    Types: Cigarettes    Quit date: 10/25/2012  . Smokeless tobacco: Never Used     Comment: QUIT 04/2010 AND STARTED BACK 2014 X 3 MONTHS. less than 1 ppd.  started at age 56.    Marland Kitchen Alcohol use No  . Drug use: No  . Sexual activity: Yes    Birth control/ protection: None   Other Topics Concern  . None   Social History Narrative   Pt lives with [name]- also pt of [facility]   Allergies  Allergen Reactions  . Azithromycin Hives    (z pak) hives  . Bee Venom Anaphylaxis  . Flagyl [Metronidazole] Hives and Shortness Of Breath  . Penicillins Shortness Of Breath and Swelling    Has patient had a PCN reaction causing immediate rash, facial/tongue/throat swelling, SOB or lightheadedness with hypotension: Yes Has patient had a PCN reaction causing severe rash involving mucus  membranes or skin necrosis: No Has patient had a PCN reaction that required hospitalization No Has patient had a PCN reaction occurring within the last 10 years: No If all of the above answers are "NO", then may proceed with Cephalosporin use.  REACTION: swelling and difficulty breathing  . Shellfish Allergy Anaphylaxis  . Klonopin [Clonazepam] Other (See Comments)    Memory difficulty   Family History  Problem Relation Age of Onset  . Allergies Mother   . Heart attack Mother   . Diabetes Mother   . Diabetes Father   . Heart disease Father   . Rheum arthritis Father   . Stroke Father   . Hypertension    . Ovarian cancer Maternal Aunt   . Lung cancer Maternal Aunt   . Breast  cancer Maternal Aunt   . Bone cancer Maternal Aunt   . Stroke Maternal Uncle   . Other Neg Hx     Past medical history, social, surgical and family history all reviewed in electronic medical record.  No pertanent information unless stated regarding to the chief complaint.   Review of Systems: No headache, visual changes, nausea, vomiting, diarrhea, constipation, dizziness, abdominal pain, skin rash, fevers, chills, night sweats, weight loss, swollen lymph nodes,  joint swelling, muscle aches, chest pain, shortness of breath, mood changes.  Positive body aches  Objective  Blood pressure (!) 132/94, pulse 94, height 5\' 7"  (1.702 m), weight (!) 320 lb 9.6 oz (145.4 kg), last menstrual period 07/17/2012, SpO2 97 %.   Systems examined below as of 01/17/17 General: NAD A&O x3 mood, affect normal Morbidly obese HEENT: Pupils equal, extraocular movements intact no nystagmus Respiratory: not short of breath at rest or with speaking Cardiovascular: No lower extremity edema, non tender Skin: Warm dry intact with no signs of infection or rash on extremities or on axial skeleton. Abdomen: Soft nontender, no masses Neuro: Cranial nerves  intact, neurovascularly intact in all extremities with 2+ DTRs and 2+ pulses. Lymph: No lymphadenopathy appreciated today  Gait normal with good balance and coordination.  MSK: Non tender with full range of motion and good stability and symmetric strength and tone of shoulders, elbows, wrist,  knee hips bilaterally.    .   Ankle: Right Pes planus noted in severe. Patient does have good range of motion but still has pain with resisted eversion of the ankle n Stable lateral and medial ligaments; squeeze test and kleiger test unremarkable; Talar dome is nontender No pain at base of 5th MT; No tenderness over cuboid; No tenderness over N spot or navicular prominence Patient has full range of motion of the ankle with good strength.      Impression and  Recommendations:     This case required medical decision making of moderate complexity.      Note: This dictation was prepared with Dragon dictation along with smaller phrase technology. Any transcriptional errors that result from this process are unintentional.

## 2017-01-17 ENCOUNTER — Ambulatory Visit (INDEPENDENT_AMBULATORY_CARE_PROVIDER_SITE_OTHER): Payer: BLUE CROSS/BLUE SHIELD | Admitting: Family Medicine

## 2017-01-17 ENCOUNTER — Encounter: Payer: Self-pay | Admitting: Family Medicine

## 2017-01-17 DIAGNOSIS — S82891G Other fracture of right lower leg, subsequent encounter for closed fracture with delayed healing: Secondary | ICD-10-CM | POA: Diagnosis not present

## 2017-01-17 MED ORDER — DICLOFENAC SODIUM 2 % TD SOLN: TRANSDERMAL | 1 refills | Status: DC

## 2017-01-17 NOTE — Patient Instructions (Signed)
Good to see you  We will get you in orthotics.  Ice is your friend Keep trucking along See me 2-4  weeks after you get the orthotics.

## 2017-01-17 NOTE — Assessment & Plan Note (Signed)
Patient is healed at this time. We'll get her set up for custom orthotics to prevent only avoid any other type of injury hopefully. We discussed proper shoes, continuing the home exercises intermittently. Patient will do more the icing. Follow-up again in 4-6 weeks.

## 2017-01-19 ENCOUNTER — Ambulatory Visit: Payer: Self-pay | Admitting: Family Medicine

## 2017-01-22 ENCOUNTER — Other Ambulatory Visit: Payer: Self-pay | Admitting: Gastroenterology

## 2017-01-22 ENCOUNTER — Other Ambulatory Visit: Payer: Self-pay | Admitting: Internal Medicine

## 2017-01-24 ENCOUNTER — Ambulatory Visit: Payer: Self-pay | Admitting: Family Medicine

## 2017-01-24 NOTE — Progress Notes (Deleted)
Procedure Note   Patient was fitted for a : standard, cushioned, semi-rigid orthotic. The orthotic was heated and afterward the patient patient seated position and molded The patient was positioned in subtalar neutral position and 10 degrees of ankle dorsiflexion in a weight bearing stance. After completion of molding, patient did have orthotic management The blank was ground to a stable position for weight bearing. Size: Base: Carbon fiber Additional Posting and Padding:  The patient ambulated these, and they were very comfortable.  

## 2017-01-25 NOTE — Telephone Encounter (Signed)
Done hardcopy to Shirron  

## 2017-01-26 ENCOUNTER — Ambulatory Visit (INDEPENDENT_AMBULATORY_CARE_PROVIDER_SITE_OTHER): Payer: BLUE CROSS/BLUE SHIELD | Admitting: Family Medicine

## 2017-01-26 ENCOUNTER — Ambulatory Visit: Payer: Self-pay | Admitting: Neurology

## 2017-01-26 ENCOUNTER — Encounter: Payer: Self-pay | Admitting: Family Medicine

## 2017-01-26 DIAGNOSIS — M7671 Peroneal tendinitis, right leg: Secondary | ICD-10-CM | POA: Diagnosis not present

## 2017-01-26 DIAGNOSIS — D860 Sarcoidosis of lung: Secondary | ICD-10-CM | POA: Diagnosis not present

## 2017-01-26 DIAGNOSIS — R269 Unspecified abnormalities of gait and mobility: Secondary | ICD-10-CM

## 2017-01-26 DIAGNOSIS — M5136 Other intervertebral disc degeneration, lumbar region: Secondary | ICD-10-CM | POA: Diagnosis not present

## 2017-01-26 DIAGNOSIS — M1990 Unspecified osteoarthritis, unspecified site: Secondary | ICD-10-CM | POA: Diagnosis not present

## 2017-01-26 NOTE — Progress Notes (Signed)
Patient was fitted for a : Igli Comfort, standard, cushioned, semi-rigid orthotic. The orthotic was heated and afterward the patient was in a seated position and the orthotic molded. The patient was positioned in subtalar neutral position and 10 degrees of ankle dorsiflexion in a non-weight bearing stance. After completion of molding, patient did have orthotic management which included instructions on acclimating to the orthotics, signs of ill fit as well as care for the orthotic.   The blank was ground to a stable position for weight bearing. Size: Men's 9.5 Base: Carbon fiber Additional Posting and Padding: 2, bilateral 300/120 postings The following postings were fitted onto the molded orthotics to help maintain a talar neutral position - Wedge posting for transverse arch:        Silicone posting for longitudinal arch:  The patient ambulated these, and they were very comfortable and supportive.

## 2017-01-26 NOTE — Progress Notes (Signed)
Pre visit review using our clinic review tool, if applicable. No additional management support is needed unless otherwise documented below in the visit note. 

## 2017-01-26 NOTE — Assessment & Plan Note (Signed)
Patient placed in custom orthotics today. Tolerated it well. Patient will slowly increase wear over the course of next several weeks. Patient will follow-up in 4 weeks and see if any adjustments as needed.

## 2017-01-27 ENCOUNTER — Other Ambulatory Visit: Payer: Self-pay | Admitting: Internal Medicine

## 2017-01-27 ENCOUNTER — Ambulatory Visit (HOSPITAL_COMMUNITY)
Admission: RE | Admit: 2017-01-27 | Payer: BLUE CROSS/BLUE SHIELD | Source: Ambulatory Visit | Admitting: Gastroenterology

## 2017-01-27 ENCOUNTER — Telehealth: Payer: Self-pay | Admitting: Gastroenterology

## 2017-01-27 SURGERY — ESOPHAGOGASTRODUODENOSCOPY (EGD) WITH PROPOFOL
Anesthesia: Monitor Anesthesia Care

## 2017-01-27 NOTE — Telephone Encounter (Signed)
Please dont schedule any procedures. She no showed X2.

## 2017-01-27 NOTE — Telephone Encounter (Signed)
FYI in case you are contacted.

## 2017-01-27 NOTE — Telephone Encounter (Signed)
Patient contacted. She says there has to have been a misunderstanding about scheduling any procedures for her. She is allowed Wednesdays off and would not have scheduled an appointment for any other day. She is financially not ready for another procedure. She is feeling well.

## 2017-01-27 NOTE — Telephone Encounter (Signed)
noted 

## 2017-02-15 ENCOUNTER — Other Ambulatory Visit: Payer: Self-pay | Admitting: Family Medicine

## 2017-02-16 ENCOUNTER — Encounter: Payer: Self-pay | Admitting: Internal Medicine

## 2017-02-16 ENCOUNTER — Ambulatory Visit: Payer: Self-pay | Admitting: Emergency Medicine

## 2017-02-23 ENCOUNTER — Telehealth: Payer: Self-pay | Admitting: Neurology

## 2017-02-23 MED ORDER — ZONISAMIDE 100 MG PO CAPS: 100.0000 mg | ORAL_CAPSULE | Freq: Every day | ORAL | 0 refills | Status: DC

## 2017-02-23 NOTE — Telephone Encounter (Signed)
Rx for zonisamide sent to pharmacy.

## 2017-02-23 NOTE — Telephone Encounter (Signed)
Caller:  Nicole Melendez  Urgent? No  Reason for the call: She had to reschedule her appt due to her work schedule. The next time she could come in would be in July and she will need her medication refilled before then. She wanted to make sure that wouldn't be a problem. She could not remember the name of the medication. Thanks

## 2017-02-23 NOTE — Telephone Encounter (Signed)
Patient needs zonisamide 100mg  daily refilled.  She was last seen in December 2017 and was to follow up in 3 months but for some reason her appointment keeps getting pushed out.  She says the medication is helping her and she has only had 2 migraines.  Please advise on refill?

## 2017-02-23 NOTE — Telephone Encounter (Signed)
Ok to refill 

## 2017-03-16 ENCOUNTER — Ambulatory Visit: Payer: Self-pay | Admitting: Neurology

## 2017-04-07 ENCOUNTER — Other Ambulatory Visit (INDEPENDENT_AMBULATORY_CARE_PROVIDER_SITE_OTHER): Payer: Self-pay

## 2017-04-07 ENCOUNTER — Ambulatory Visit (INDEPENDENT_AMBULATORY_CARE_PROVIDER_SITE_OTHER): Payer: Self-pay | Admitting: Internal Medicine

## 2017-04-07 VITALS — BP 138/88 | HR 88 | Ht 67.0 in | Wt 327.0 lb

## 2017-04-07 DIAGNOSIS — R197 Diarrhea, unspecified: Secondary | ICD-10-CM

## 2017-04-07 DIAGNOSIS — I10 Essential (primary) hypertension: Secondary | ICD-10-CM

## 2017-04-07 DIAGNOSIS — R1033 Periumbilical pain: Secondary | ICD-10-CM

## 2017-04-07 DIAGNOSIS — Z794 Long term (current) use of insulin: Secondary | ICD-10-CM

## 2017-04-07 DIAGNOSIS — M7989 Other specified soft tissue disorders: Secondary | ICD-10-CM

## 2017-04-07 DIAGNOSIS — E1140 Type 2 diabetes mellitus with diabetic neuropathy, unspecified: Secondary | ICD-10-CM

## 2017-04-07 DIAGNOSIS — R109 Unspecified abdominal pain: Secondary | ICD-10-CM | POA: Insufficient documentation

## 2017-04-07 LAB — CBC WITH DIFFERENTIAL/PLATELET
BASOS ABS: 0.1 10*3/uL (ref 0.0–0.1)
Basophils Relative: 1.4 % (ref 0.0–3.0)
Eosinophils Absolute: 0.3 10*3/uL (ref 0.0–0.7)
Eosinophils Relative: 5.3 % — ABNORMAL HIGH (ref 0.0–5.0)
HEMATOCRIT: 38.2 % (ref 36.0–46.0)
Hemoglobin: 12.7 g/dL (ref 12.0–15.0)
LYMPHS PCT: 21.3 % (ref 12.0–46.0)
Lymphs Abs: 1.2 10*3/uL (ref 0.7–4.0)
MCHC: 33.1 g/dL (ref 30.0–36.0)
MCV: 86.8 fl (ref 78.0–100.0)
MONOS PCT: 6.5 % (ref 3.0–12.0)
Monocytes Absolute: 0.4 10*3/uL (ref 0.1–1.0)
NEUTROS PCT: 65.5 % (ref 43.0–77.0)
Neutro Abs: 3.8 10*3/uL (ref 1.4–7.7)
Platelets: 291 10*3/uL (ref 150.0–400.0)
RBC: 4.4 Mil/uL (ref 3.87–5.11)
RDW: 15.5 % (ref 11.5–15.5)
WBC: 5.8 10*3/uL (ref 4.0–10.5)

## 2017-04-07 MED ORDER — METHYLPREDNISOLONE ACETATE 80 MG/ML IJ SUSP
80.0000 mg | Freq: Once | INTRAMUSCULAR | Status: AC
  Administered 2017-04-07: 80 mg via INTRAMUSCULAR

## 2017-04-07 NOTE — Progress Notes (Signed)
Subjective:    Patient ID: Nicole Melendez, female    DOB: 1976/05/16, 41 y.o.   MRN: [id number]  HPI  Here with 3 days onset moderate, intermittent now more constant mid abd dull and crampy pain new to her without radiation, with mild nausea, vomit x 1 and several loose stools;' has felt warm but no high fever, no overt bleeding.  Eating seems to make worse, nothing else seems to make better or worse.   Also has bilat hand swelling with other polyarthralgia to lower back, leg, hip and ankle c/w with prior sarcoid flares per pt.   Pt denies polydipsia, polyuria. Pt denies new neurological symptoms such as new headache, or facial or extremity weakness or numbness  Past Medical History:  Diagnosis Date  . Allergic rhinitis, cause unspecified   . Anemia   . Anxiety   . Depression   . Diabetes mellitus type II    steroid related, patient states "im no longer diabetic"  . Essential hypertension 08/08/2015  . GERD (gastroesophageal reflux disease)   . History of blood transfusion   . Hyperlipidemia   . Menorrhagia   . Migraine   . Morbid obesity ([place])   . Otitis media 06/28/2015  . Peptic ulcer disease   . Positive ANA (antinuclear antibody) 02/14/2012  . Sarcoid    including hand per rheumatology-Dr. Amil Amen  . Sarcoidosis of lung ([place])   . Shortness of breath    on exertion  . Varicose veins with pain    Past Surgical History:  Procedure Laterality Date  . ABDOMINAL HYSTERECTOMY    . COLONOSCOPY WITH PROPOFOL N/A 10/19/2016   Procedure: COLONOSCOPY WITH PROPOFOL;  Surgeon: Mauri Pole, MD;  Location: [facility];  Service: Endoscopy;  Laterality: N/A;  . ESOPHAGOGASTRODUODENOSCOPY (EGD) WITH PROPOFOL N/A 10/19/2016   Procedure: ESOPHAGOGASTRODUODENOSCOPY (EGD) WITH PROPOFOL;  Surgeon: Mauri Pole, MD;  Location: [facility];  Service: Endoscopy;  Laterality: N/A;  . HERNIA MESH REMOVAL  02/2013  . uterine ablation  03/2010  . WISDOM TOOTH EXTRACTION      reports  that she quit smoking about 4 years ago. Her smoking use included Cigarettes. She has a 20.00 pack-year smoking history. She has never used smokeless tobacco. She reports that she does not drink alcohol or use drugs. family history includes Allergies in her mother; Bone cancer in her maternal aunt; Breast cancer in her maternal aunt; Diabetes in her father and mother; Heart attack in her mother; Heart disease in her father; Lung cancer in her maternal aunt; Ovarian cancer in her maternal aunt; Rheum arthritis in her father; Stroke in her father and maternal uncle. Allergies  Allergen Reactions  . Azithromycin Hives    (z pak) hives  . Bee Venom Anaphylaxis  . Flagyl [Metronidazole] Hives and Shortness Of Breath  . Penicillins Shortness Of Breath and Swelling    Has patient had a PCN reaction causing immediate rash, facial/tongue/throat swelling, SOB or lightheadedness with hypotension: Yes Has patient had a PCN reaction causing severe rash involving mucus membranes or skin necrosis: No Has patient had a PCN reaction that required hospitalization No Has patient had a PCN reaction occurring within the last 10 years: No If all of the above answers are "NO", then may proceed with Cephalosporin use.  REACTION: swelling and difficulty breathing  . Shellfish Allergy Anaphylaxis  . Klonopin [Clonazepam] Other (See Comments)    Memory difficulty   Current Outpatient Prescriptions on File Prior to Visit  Medication Sig  Dispense Refill  . acetaminophen (TYLENOL) 500 MG tablet Take 1,000 mg by mouth every 6 (six) hours as needed for mild pain or moderate pain.    Marland Kitchen alendronate (FOSAMAX) 70 MG tablet Take 1 tablet (70 mg total) by mouth every 7 (seven) days. 4 tablet 11  . ALPRAZolam (XANAX) 0.5 MG tablet TAKE 1 TABLET BY MOUTH THREE TIMES DAILY AS NEEDED 90 tablet 2  . azelastine (ASTELIN) 0.1 % nasal spray Place 2 sprays into both nostrils 2 (two) times daily. Use in each nostril as directed 30 mL 12    . Blood Glucose Monitoring Suppl (ONE TOUCH ULTRA 2) W/DEVICE KIT Use as directed four times daily. Dx: E11.9 1 each 0  . budesonide-formoterol (SYMBICORT) 160-4.5 MCG/ACT inhaler Inhale 2 puffs into the lungs 2 (two) times daily. 1 Inhaler 5  . cetirizine (ZYRTEC) 10 MG tablet TAKE 1 TABLET(10 MG) BY MOUTH DAILY 90 tablet 0  . cyclobenzaprine (FLEXERIL) 5 MG tablet Take 1 tablet (5 mg total) by mouth at bedtime as needed for muscle spasms. (Patient taking differently: Take 10 mg by mouth at bedtime as needed for muscle spasms. ) 30 tablet 3  . folic acid (FOLVITE) 1 MG tablet TAKE 2 TABLETS BY MOUTH DAILY 180 tablet 3  . furosemide (LASIX) 40 MG tablet 1 tab by mouth every day in the AM and then 1 as needed only for persistent swelling in the PM 60 tablet 11  . gabapentin (NEURONTIN) 300 MG capsule TAKE 1 CAPSULE(300 MG) BY MOUTH THREE TIMES DAILY 90 capsule 5  . glucose blood (ONE TOUCH ULTRA TEST) test strip Use to check blood sugars four times a day Dx E11.9 400 each 11  . HUMALOG 100 UNIT/ML injection INJECT UP TO 12 UNITS INTO THE SKIN THREE TIMES DAILY WITH MEALS WITH SLIDING SCALE AS DIRECTED (Patient taking differently: Inject 2 to 12 units 3 times a day as needed for high blood sugar) 10 mL 0  . Insulin Syringe-Needle U-100 25G X 1" 1 ML MISC Use to administer humalog insulin three times a day Dx E11.9 100 each 5  . levalbuterol (XOPENEX HFA) 45 MCG/ACT inhaler Inhale 1-2 puffs into the lungs every 8 (eight) hours as needed for wheezing or shortness of breath. 1 Inhaler 5  . lidocaine (XYLOCAINE) 2 % solution Use as directed 20 mLs in the mouth or throat as needed for mouth pain. 100 mL 1  . methotrexate (RHEUMATREX) 2.5 MG tablet Take 15 mg by mouth every Tuesday.     . ONE TOUCH ULTRA TEST test strip TEST BLOOD SUGAR FOUR TIMES DAILY 400 each 0  . pantoprazole (PROTONIX) 40 MG tablet Take 1 tablet (40 mg total) by mouth 2 (two) times daily before a meal. 90 tablet 3  . potassium chloride  (K-DUR) 10 MEQ tablet 3 tab by mouth per day only when taking the lasix (Patient taking differently: Take 10-20 mEq by mouth 2 (two) times daily. Take 10 meq in the morning and 88mq at night) 90 tablet 11  . ranitidine (ZANTAC) 150 MG tablet TAKE 1 TABLET(150 MG) BY MOUTH AT BEDTIME 30 tablet 0  . sertraline (ZOLOFT) 100 MG tablet TAKE 2 TABLETS BY MOUTH DAILY 180 tablet 0  . SUMAtriptan (IMITREX) 100 MG tablet Take 1 tablet by mouth at the onset of a headache. May repeat in 2 hours if headache persists or recurs. 10 tablet 2  . telmisartan (MICARDIS) 40 MG tablet Take 1 tablet (40 mg total) by mouth daily.  90 tablet 3  . TURMERIC PO Take 1 tablet by mouth daily.    . vitamin B-12 (CYANOCOBALAMIN) 1000 MCG tablet Take 1,000 mcg by mouth daily.    . Vitamin D, Ergocalciferol, (DRISDOL) 50000 units CAPS capsule TAKE 1 CAPSULE BY MOUTH EVERY 7 DAYS 12 capsule 0  . zonisamide (ZONEGRAN) 100 MG capsule Take 1 capsule (100 mg total) by mouth daily. 90 capsule 0   No current facility-administered medications on file prior to visit.    Review of Systems  Constitutional: Negative for other unusual diaphoresis or sweats HENT: Negative for ear discharge or swelling Eyes: Negative for other worsening visual disturbances Respiratory: Negative for stridor or other swelling  Gastrointestinal: Negative for worsening distension or other blood Genitourinary: Negative for retention or other urinary change Musculoskeletal: Negative for other MSK pain or swelling Skin: Negative for color change or other new lesions Neurological: Negative for worsening tremors and other numbness  Psychiatric/Behavioral: Negative for worsening agitation or other fatigue All other system neg per pt    Objective:   Physical Exam BP 138/88   Pulse 88   Ht _0  (1.702 m)   Wt (!) 327 lb (148.3 kg)   LMP 07/17/2012   SpO2 100%   BMI 51.22 kg/m  VS noted, in pain, grimacing, non toxic Constitutional: Pt appears in  NAD HENT: Head: NCAT.  Right Ear: External ear normal.  Left Ear: External ear normal.  Eyes: . Pupils are equal, round, and reactive to light. Conjunctivae and EOM are normal Nose: without d/c or deformity Neck: Neck supple. Gross normal ROM Cardiovascular: Normal rate and regular rhythm.   Pulmonary/Chest: Effort normal and breath sounds without rales or wheezing.  Abd:  Soft, ND, + BS, no organomegaly but mild to mod mid abd tender, without guarding or rebound Neurological: Pt is alert. At baseline orientation, motor grossly intact Skin: Skin is warm. No rashes, other new lesions, no LE edema Psychiatric: Pt behavior is normal without agitation  No other exam findings    Assessment & Plan:

## 2017-04-07 NOTE — Patient Instructions (Signed)
You had the steroid shot today  Please continue all other medications as before, and refills have been done if requested.  Please have the pharmacy call with any other refills you may need.  Please continue your efforts at being more active, low cholesterol diet, and weight control.  You are otherwise up to date with prevention measures today.  Please keep your appointments with your specialists as you may have planned  Please go to the LAB in the Basement (turn left off the elevator) for the tests to be done today  You will be contacted by phone if any changes need to be made immediately.  Otherwise, you will receive a letter about your results with an explanation, but please check with MyChart first.  Please remember to sign up for MyChart if you have not done so, as this will be important to you in the future with finding out test results, communicating by private email, and scheduling acute appointments online when needed.

## 2017-04-08 ENCOUNTER — Other Ambulatory Visit: Payer: Self-pay

## 2017-04-08 ENCOUNTER — Telehealth: Payer: Self-pay

## 2017-04-08 DIAGNOSIS — K859 Acute pancreatitis without necrosis or infection, unspecified: Secondary | ICD-10-CM

## 2017-04-08 DIAGNOSIS — R197 Diarrhea, unspecified: Secondary | ICD-10-CM

## 2017-04-08 LAB — BASIC METABOLIC PANEL
BUN: 8 mg/dL (ref 6–23)
CALCIUM: 9.8 mg/dL (ref 8.4–10.5)
CO2: 27 meq/L (ref 19–32)
CREATININE: 0.79 mg/dL (ref 0.40–1.20)
Chloride: 106 mEq/L (ref 96–112)
GFR: 102.03 mL/min (ref >60.00)
GLUCOSE: 98 mg/dL (ref 70–99)
Potassium: 4 mEq/L (ref 3.5–5.1)
Sodium: 139 mEq/L (ref 135–145)

## 2017-04-08 LAB — URINALYSIS, ROUTINE W REFLEX MICROSCOPIC
BILIRUBIN URINE: NEGATIVE
HGB URINE DIPSTICK: NEGATIVE
Ketones, ur: NEGATIVE
LEUKOCYTES UA: NEGATIVE
Nitrite: NEGATIVE
Specific Gravity, Urine: 1.02 (ref 1.000–1.030)
TOTAL PROTEIN, URINE-UPE24: NEGATIVE
UROBILINOGEN UA: 0.2 (ref 0.0–1.0)
Urine Glucose: NEGATIVE
pH: 6 (ref 5.0–8.0)

## 2017-04-08 LAB — HEPATIC FUNCTION PANEL
ALBUMIN: 3.9 g/dL (ref 3.5–5.2)
ALT: 18 U/L (ref 0–35)
AST: 24 U/L (ref 0–37)
Alkaline Phosphatase: 110 U/L (ref 39–117)
Bilirubin, Direct: 0 mg/dL (ref 0.0–0.3)
TOTAL PROTEIN: 7 g/dL (ref 6.0–8.3)
Total Bilirubin: 0.3 mg/dL (ref 0.2–1.2)

## 2017-04-08 LAB — TSH: TSH: 1.84 u[IU]/mL (ref 0.35–4.50)

## 2017-04-08 LAB — LIPASE: Lipase: 104 U/L — ABNORMAL HIGH (ref 11.0–59.0)

## 2017-04-08 NOTE — Telephone Encounter (Signed)
See below

## 2017-04-08 NOTE — Telephone Encounter (Signed)
-----   Message from Biagio Borg, MD sent at 04/08/2017  2:54 PM EDT ----- Left message on MyChart, pt to cont same tx except  The test results show that your current treatment is OK, except the Lipase is elevated, which is suggestive of acute pancreatitis.  This is not always true, but the other labs were ok.  Please follow a lower fat diet which can help, but if you have worsening pain, you may need to go to ED for CT scan.   Nicole Melendez to please inform pt,

## 2017-04-08 NOTE — Addendum Note (Signed)
Addended by: Biagio Borg on: 04/08/2017 03:49 PM   Modules accepted: Orders

## 2017-04-08 NOTE — Telephone Encounter (Signed)
Pt has been informed and expressed understanding. However she would like to know when she will be able to return to work? Also she might need another note for work if she must remain out of work. Please advise.

## 2017-04-10 DIAGNOSIS — R197 Diarrhea, unspecified: Secondary | ICD-10-CM | POA: Insufficient documentation

## 2017-04-10 NOTE — Assessment & Plan Note (Signed)
Also for stool studies

## 2017-04-10 NOTE — Assessment & Plan Note (Signed)
stable overall by history and exam, recent data reviewed with pt, and pt to continue medical treatment as before,  to f/u any worsening symptoms or concerns BP Readings from Last 3 Encounters:  04/07/17 138/88  01/17/17 (!) 132/94  01/04/17 130/70

## 2017-04-10 NOTE — Assessment & Plan Note (Signed)
Doubt RA but possible sarcoid as she suggests, for depomedrol 80 IM x 1

## 2017-04-10 NOTE — Assessment & Plan Note (Signed)
stable overall by history and exam, recent data reviewed with pt, and pt to continue medical treatment as before,  to f/u any worsening symptoms or concerns Lab Results  Component Value Date   HGBA1C 6.3 06/16/2016

## 2017-04-10 NOTE — Assessment & Plan Note (Addendum)
Etiology unclear, cant r/o GB, peptic or pancreatic, infectious gastroenteritis or even gastroparesis, for abd u/s also labs as documented

## 2017-04-11 NOTE — Telephone Encounter (Signed)
Pt called wanting to know when she can return to work? Please call back

## 2017-04-12 ENCOUNTER — Telehealth: Payer: Self-pay | Admitting: Internal Medicine

## 2017-04-12 LAB — STOOL CULTURE

## 2017-04-12 NOTE — Telephone Encounter (Signed)
New letter done

## 2017-04-12 NOTE — Telephone Encounter (Signed)
Pt called stating she did not read her note she picked up today stating she can return to work today 6/19, she missed work today so she needs a new note stating she can return 6/20, would like to come pick it up

## 2017-04-12 NOTE — Telephone Encounter (Signed)
Ok for note date of return to work adjustment

## 2017-04-12 NOTE — Telephone Encounter (Signed)
Pt notified and will come to get it today Thanks

## 2017-04-15 ENCOUNTER — Encounter: Payer: Self-pay | Admitting: Internal Medicine

## 2017-04-20 ENCOUNTER — Telehealth: Payer: Self-pay

## 2017-04-20 NOTE — Telephone Encounter (Signed)
Called pt LVM Letter at front desk

## 2017-04-21 NOTE — Telephone Encounter (Signed)
Error

## 2017-05-10 ENCOUNTER — Other Ambulatory Visit: Payer: Self-pay | Admitting: Internal Medicine

## 2017-05-11 ENCOUNTER — Other Ambulatory Visit: Payer: Self-pay | Admitting: Internal Medicine

## 2017-05-12 ENCOUNTER — Ambulatory Visit: Payer: Self-pay | Admitting: Neurology

## 2017-05-13 ENCOUNTER — Other Ambulatory Visit: Payer: Self-pay | Admitting: Internal Medicine

## 2017-05-13 ENCOUNTER — Other Ambulatory Visit: Payer: Self-pay | Admitting: Neurology

## 2017-05-13 NOTE — Telephone Encounter (Signed)
Rx sent 

## 2017-05-14 ENCOUNTER — Ambulatory Visit (INDEPENDENT_AMBULATORY_CARE_PROVIDER_SITE_OTHER): Payer: BLUE CROSS/BLUE SHIELD | Admitting: Family Medicine

## 2017-05-14 ENCOUNTER — Encounter: Payer: Self-pay | Admitting: Family Medicine

## 2017-05-14 VITALS — BP 126/84 | HR 76 | Temp 98.2°F | Ht 67.0 in | Wt 317.1 lb

## 2017-05-14 DIAGNOSIS — R112 Nausea with vomiting, unspecified: Secondary | ICD-10-CM

## 2017-05-14 MED ORDER — ONDANSETRON 8 MG PO TBDP: 8.0000 mg | ORAL_TABLET | Freq: Three times a day (TID) | ORAL | 0 refills | Status: DC | PRN

## 2017-05-14 NOTE — Patient Instructions (Signed)
Keep sipping fluids If you get worse or have increased temp or abd pain or diarrhea - alert [place]  Take the zofran  Rest as much as you can   Advance diet as tolerated   Follow up if no improvement

## 2017-05-14 NOTE — Progress Notes (Signed)
Subjective:    Patient ID: Nicole Melendez, female    DOB: 1976/03/24, 41 y.o.   MRN: [id number]  HPI Started Wednesday - vomited at work  Drank lots of fluids and stopped eating  Wed night - felt lousy  Thursday tried work and vomited again   [employer] from Last 3 Encounters:  05/14/17 (!) 317 lb 1.3 oz (143.8 kg)  04/07/17 (!) 327 lb (148.3 kg)  01/17/17 (!) 320 lb 9.6 oz (145.4 kg)   Not pregnant- hx of partial hysterectomy  Has lost wt intentionally and gave up red meat   Has hx of sarcoidosis - and that sometimes bothers her stomach   No diarrhea  Some pain abd - hurts above umbilicus   Lab Results  Component Value Date   LIPASE 104.0 (H) 04/07/2017     Just had pancreatitis last month  Pain is not that bad   Taking liquid tylenol   Wants something for nausea   She takes zantac 150 mg bid   Has had cold water / ginger ale and crackers - keeping down Is a little hungry   Patient Active Problem List   Diagnosis Date Noted  . Diarrhea 04/10/2017  . Bilateral hand swelling 04/07/2017  . Abdominal pain 04/07/2017  . Abnormality of gait 01/26/2017  . Avulsion fracture of right ankle 12/01/2016  . Ulcer of esophagus with bleeding   . Gastritis and gastroduodenitis   . Blepharitis of right upper eyelid 10/09/2016  . Peroneal tendinitis of lower leg, right 10/06/2016  . Right ankle pain 09/16/2016  . Dizziness 03/11/2016  . Mixed headache 03/09/2016  . Chronic venous insufficiency 02/24/2016  . Gastroenteritis 12/30/2015  . Headache 12/30/2015  . Epistaxis 12/09/2015  . 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 Cervical muscle strain 12/03/2015  . Right-sided face pain 11/26/2015  . Multiple contusions 11/26/2015  . Neck pain, bilateral posterior 11/26/2015  . Lower back pain 11/26/2015  . Spinal stenosis of lumbar region 10/28/2015  . Chronic sciatica of left side 10/28/2015  . DOE (dyspnea on exertion) 09/02/2015  . Acute bronchitis 08/22/2015  .  Essential hypertension 08/08/2015  . Diaphoresis 08/07/2015  . Cough 07/02/2015  . Vaginitis and vulvovaginitis 06/29/2015  . Otitis media 06/28/2015  . Thrush 06/24/2015  . Nausea & vomiting 06/10/2015  . Acute upper respiratory infection 11/14/2014  . Wheezing 11/14/2014  . Angioedema 03/19/2014  . Vaginitis 11/19/2012  . Smoker 11/19/2012  . Positive ANA (antinuclear antibody) 02/14/2012  . Polyarthritis 02/11/2012  . Migraine 04/05/2011  . Preventative health care 02/21/2011  . Allergic rhinitis 02/17/2011  . URTICARIA 09/29/2010  . DEPRESSION 09/14/2010  . Diabetes mellitus with neuropathy ([place]) 07/31/2010  . PERIPHERAL EDEMA 07/31/2010  . INSOMNIA-SLEEP DISORDER-UNSPEC 06/19/2010  . 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 MENORRHAGIA 05/15/2010  . Hyperlipidemia 12/17/2009  . Iron deficiency anemia 12/17/2009  . Anxiety state 12/17/2009  . Severe obesity (BMI >= 40) ([place]) 07/02/2007  . Gastroesophageal reflux disease 07/02/2007  . PEPTIC ULCER DISEASE 07/02/2007   Past Medical History:  Diagnosis Date  . Allergic rhinitis, cause unspecified   . Anemia   . Anxiety   . Depression   . Diabetes mellitus type II    steroid related, patient states "im no longer diabetic"  . Essential hypertension 08/08/2015  . GERD (gastroesophageal reflux disease)   . History of blood transfusion   . Hyperlipidemia   . Menorrhagia   . Migraine   . Morbid obesity ([place])   . Otitis  media 06/28/2015  . Peptic ulcer disease   . Positive ANA (antinuclear antibody) 02/14/2012  . Sarcoid    including hand per rheumatology-Dr. Amil Amen  . Sarcoidosis of lung ([place])   . Shortness of breath    on exertion  . Varicose veins with pain    Past Surgical History:  Procedure Laterality Date  . ABDOMINAL HYSTERECTOMY    . COLONOSCOPY WITH PROPOFOL N/A 10/19/2016   Procedure: COLONOSCOPY WITH PROPOFOL;  Surgeon: Mauri Pole, MD;  Location: [facility];  Service: Endoscopy;  Laterality: N/A;    . ESOPHAGOGASTRODUODENOSCOPY (EGD) WITH PROPOFOL N/A 10/19/2016   Procedure: ESOPHAGOGASTRODUODENOSCOPY (EGD) WITH PROPOFOL;  Surgeon: Mauri Pole, MD;  Location: [facility];  Service: Endoscopy;  Laterality: N/A;  . HERNIA MESH REMOVAL  02/2013  . uterine ablation  03/2010  . WISDOM TOOTH EXTRACTION     Social History  Substance Use Topics  . Smoking status: Former Smoker    Packs/day: 1.00    Years: 20.00    Types: Cigarettes    Quit date: 10/25/2012  . Smokeless tobacco: Never Used     Comment: QUIT 04/2010 AND STARTED BACK 2014 X 3 MONTHS. less than 1 ppd.  started at age 73.    Marland Kitchen Alcohol use No   Family History  Problem Relation Age of Onset  . Allergies Mother   . Heart attack Mother   . Diabetes Mother   . Diabetes Father   . Heart disease Father   . Rheum arthritis Father   . Stroke Father   . Hypertension Unknown   . Ovarian cancer Maternal Aunt   . Lung cancer Maternal Aunt   . Breast cancer Maternal Aunt   . Bone cancer Maternal Aunt   . Stroke Maternal Uncle   . Other Neg Hx    Allergies  Allergen Reactions  . Azithromycin Hives    (z pak) hives  . Bee Venom Anaphylaxis  . Flagyl [Metronidazole] Hives and Shortness Of Breath  . Penicillins Shortness Of Breath and Swelling    Has patient had a PCN reaction causing immediate rash, facial/tongue/throat swelling, SOB or lightheadedness with hypotension: Yes Has patient had a PCN reaction causing severe rash involving mucus membranes or skin necrosis: No Has patient had a PCN reaction that required hospitalization No Has patient had a PCN reaction occurring within the last 10 years: No If all of the above answers are "NO", then may proceed with Cephalosporin use.  REACTION: swelling and difficulty breathing  . Shellfish Allergy Anaphylaxis  . Klonopin [Clonazepam] Other (See Comments)    Memory difficulty   Current Outpatient Prescriptions on File Prior to Visit  Medication Sig Dispense Refill  .  acetaminophen (TYLENOL) 500 MG tablet Take 1,000 mg by mouth every 6 (six) hours as needed for mild pain or moderate pain.    Marland Kitchen alendronate (FOSAMAX) 70 MG tablet Take 1 tablet (70 mg total) by mouth every 7 (seven) days. 4 tablet 11  . ALPRAZolam (XANAX) 0.5 MG tablet TAKE 1 TABLET BY MOUTH THREE TIMES DAILY AS NEEDED 90 tablet 2  . azelastine (ASTELIN) 0.1 % nasal spray Place 2 sprays into both nostrils 2 (two) times daily. Use in each nostril as directed 30 mL 12  . Blood Glucose Monitoring Suppl (ONE TOUCH ULTRA 2) W/DEVICE KIT Use as directed four times daily. Dx: E11.9 1 each 0  . budesonide-formoterol (SYMBICORT) 160-4.5 MCG/ACT inhaler Inhale 2 puffs into the lungs 2 (two) times daily. 1 Inhaler 5  .  cetirizine (ZYRTEC) 10 MG tablet TAKE 1 TABLET(10 MG) BY MOUTH DAILY 90 tablet 0  . cyclobenzaprine (FLEXERIL) 5 MG tablet Take 1 tablet (5 mg total) by mouth at bedtime as needed for muscle spasms. (Patient taking differently: Take 10 mg by mouth at bedtime as needed for muscle spasms. ) 30 tablet 3  . folic acid (FOLVITE) 1 MG tablet TAKE 2 TABLETS BY MOUTH DAILY 180 tablet 3  . furosemide (LASIX) 40 MG tablet 1 tab by mouth every day in the AM and then 1 as needed only for persistent swelling in the PM 60 tablet 11  . gabapentin (NEURONTIN) 300 MG capsule TAKE 1 CAPSULE(300 MG) BY MOUTH THREE TIMES DAILY 90 capsule 5  . glucose blood (ONE TOUCH ULTRA TEST) test strip Use to check blood sugars four times a day Dx E11.9 400 each 11  . HUMALOG 100 UNIT/ML injection INJECT UP TO 12 UNITS INTO THE SKIN THREE TIMES DAILY WITH MEALS WITH SLIDING SCALE AS DIRECTED (Patient taking differently: Inject 2 to 12 units 3 times a day as needed for high blood sugar) 10 mL 0  . Insulin Syringe-Needle U-100 25G X 1" 1 ML MISC Use to administer humalog insulin three times a day Dx E11.9 100 each 5  . levalbuterol (XOPENEX HFA) 45 MCG/ACT inhaler Inhale 1-2 puffs into the lungs every 8 (eight) hours as needed for  wheezing or shortness of breath. 1 Inhaler 5  . lidocaine (XYLOCAINE) 2 % solution Use as directed 20 mLs in the mouth or throat as needed for mouth pain. 100 mL 1  . methotrexate (RHEUMATREX) 2.5 MG tablet Take 15 mg by mouth every Tuesday.     . ONE TOUCH ULTRA TEST test strip TEST BLOOD SUGAR FOUR TIMES DAILY 400 each 0  . pantoprazole (PROTONIX) 40 MG tablet Take 1 tablet (40 mg total) by mouth 2 (two) times daily before a meal. 90 tablet 3  . potassium chloride (K-DUR) 10 MEQ tablet 3 tab by mouth per day only when taking the lasix (Patient taking differently: Take 10-20 mEq by mouth 2 (two) times daily. Take 10 meq in the morning and 74mq at night) 90 tablet 11  . ranitidine (ZANTAC) 150 MG tablet TAKE 1 TABLET(150 MG) BY MOUTH AT BEDTIME 30 tablet 0  . sertraline (ZOLOFT) 100 MG tablet TAKE 2 TABLETS BY MOUTH DAILY 180 tablet 3  . SUMAtriptan (IMITREX) 100 MG tablet Take 1 tablet by mouth at the onset of a headache. May repeat in 2 hours if headache persists or recurs. 10 tablet 2  . telmisartan (MICARDIS) 40 MG tablet TAKE 1 TABLET(40 MG) BY MOUTH DAILY 90 tablet 0  . TURMERIC PO Take 1 tablet by mouth daily.    . vitamin B-12 (CYANOCOBALAMIN) 1000 MCG tablet Take 1,000 mcg by mouth daily.    . Vitamin D, Ergocalciferol, (DRISDOL) 50000 units CAPS capsule TAKE 1 CAPSULE BY MOUTH EVERY 7 DAYS 12 capsule 0  . zonisamide (ZONEGRAN) 100 MG capsule Take 1 capsule (100 mg total) by mouth daily. 90 capsule 0  . zonisamide (ZONEGRAN) 100 MG capsule TAKE 1 CAPSULE(100 MG) BY MOUTH DAILY 30 capsule 2   No current facility-administered medications on file prior to visit.     Review of Systems Review of Systems  Constitutional: Negative for fever, appetite change, fatigue and unexpected weight change.  Eyes: Negative for pain and visual disturbance.  Respiratory: Negative for cough and shortness of breath.   Cardiovascular: Negative for cp or palpitations  Gastrointestinal: Negative for,  diarrhea and constipation. pos for n/v   Neg for blood in stool or black stool  Genitourinary: Negative for urgency and frequency.  Skin: Negative for pallor or rash   Neurological: Negative for weakness, light-headedness, numbness and headaches.  Hematological: Negative for adenopathy. Does not bruise/bleed easily.  Psychiatric/Behavioral: Negative for dysphoric mood. The patient is not nervous/anxious.        Objective:   Physical Exam  Constitutional: She appears well-developed and well-nourished. No distress.  obese and well appearing   HENT:  Head: Normocephalic and atraumatic.  Mouth/Throat: Oropharynx is clear and moist.  MMM  Eyes: Pupils are equal, round, and reactive to light. Conjunctivae and EOM are normal. No scleral icterus.  Neck: Normal range of motion. Neck supple.  Cardiovascular: Normal rate, regular rhythm and normal heart sounds.   Pulmonary/Chest: Effort normal and breath sounds normal. No respiratory distress. She has no wheezes. She has no rales.  Abdominal: Soft. Bowel sounds are normal. She exhibits no distension and no mass. There is no hepatosplenomegaly. There is tenderness in the epigastric area. There is no rebound, no guarding, no CVA tenderness, no tenderness at McBurney's point and negative Murphy's sign.  Mild epigastric tenderness No M   Lymphadenopathy:    She has no cervical adenopathy.  Neurological: She is alert.  Skin: Skin is warm and dry. No erythema. No pallor.  Nl skin turgor Brisk capillary refill   Psychiatric: She has a normal mood and affect.          Assessment & Plan:   Problem List Items Addressed This Visit      Digestive   Nausea & vomiting - Primary    Sounds like this is recurrent with hx of sarcoidosis and pancreatitis  Rev hx  Mild epigastric pain and tenderness-per pt much improved  Nausea persists but not dehydrated Px zofran  If no imp in 24 h may need ED eval and IVF-she voiced understanding   F/u with pcp  as well

## 2017-05-15 NOTE — Assessment & Plan Note (Signed)
Sounds like this is recurrent with hx of sarcoidosis and pancreatitis  Rev hx  Mild epigastric pain and tenderness-per pt much improved  Nausea persists but not dehydrated Px zofran  If no imp in 24 h may need ED eval and IVF-she voiced understanding   F/u with pcp as well

## 2017-05-16 ENCOUNTER — Ambulatory Visit: Payer: Self-pay | Admitting: Emergency Medicine

## 2017-05-18 ENCOUNTER — Ambulatory Visit: Payer: Self-pay | Admitting: Acute Care

## 2017-06-01 ENCOUNTER — Encounter: Payer: Self-pay | Admitting: Internal Medicine

## 2017-06-11 ENCOUNTER — Other Ambulatory Visit: Payer: Self-pay | Admitting: Internal Medicine

## 2017-06-14 ENCOUNTER — Other Ambulatory Visit: Payer: Self-pay | Admitting: Internal Medicine

## 2017-06-14 MED ORDER — NAPROXEN 500 MG PO TABS: 500.0000 mg | ORAL_TABLET | Freq: Two times a day (BID) | ORAL | 5 refills | Status: DC | PRN

## 2017-06-14 NOTE — Telephone Encounter (Signed)
vimovo changed to naproxen bid prn, as pt is already on protonix as well

## 2017-06-15 ENCOUNTER — Other Ambulatory Visit (INDEPENDENT_AMBULATORY_CARE_PROVIDER_SITE_OTHER): Payer: Self-pay

## 2017-06-15 ENCOUNTER — Other Ambulatory Visit: Payer: Self-pay | Admitting: Internal Medicine

## 2017-06-15 ENCOUNTER — Encounter: Payer: Self-pay | Admitting: Internal Medicine

## 2017-06-15 ENCOUNTER — Ambulatory Visit (INDEPENDENT_AMBULATORY_CARE_PROVIDER_SITE_OTHER): Payer: Self-pay | Admitting: Internal Medicine

## 2017-06-15 VITALS — BP 120/84 | HR 95 | Temp 98.3°F | Ht 67.0 in | Wt 322.0 lb

## 2017-06-15 DIAGNOSIS — D869 Sarcoidosis, unspecified: Secondary | ICD-10-CM

## 2017-06-15 DIAGNOSIS — E1140 Type 2 diabetes mellitus with diabetic neuropathy, unspecified: Secondary | ICD-10-CM

## 2017-06-15 DIAGNOSIS — Z794 Long term (current) use of insulin: Secondary | ICD-10-CM

## 2017-06-15 DIAGNOSIS — Z23 Encounter for immunization: Secondary | ICD-10-CM

## 2017-06-15 DIAGNOSIS — Z0000 Encounter for general adult medical examination without abnormal findings: Secondary | ICD-10-CM

## 2017-06-15 DIAGNOSIS — Z202 Contact with and (suspected) exposure to infections with a predominantly sexual mode of transmission: Secondary | ICD-10-CM

## 2017-06-15 DIAGNOSIS — I10 Essential (primary) hypertension: Secondary | ICD-10-CM

## 2017-06-15 DIAGNOSIS — Z0279 Encounter for issue of other medical certificate: Secondary | ICD-10-CM

## 2017-06-15 LAB — HEPATIC FUNCTION PANEL
ALK PHOS: 134 U/L — AB (ref 39–117)
ALT: 10 U/L (ref 0–35)
AST: 11 U/L (ref 0–37)
Albumin: 3.7 g/dL (ref 3.5–5.2)
BILIRUBIN DIRECT: 0.1 mg/dL (ref 0.0–0.3)
Total Bilirubin: 0.3 mg/dL (ref 0.2–1.2)
Total Protein: 7.1 g/dL (ref 6.0–8.3)

## 2017-06-15 LAB — URINALYSIS, ROUTINE W REFLEX MICROSCOPIC
BILIRUBIN URINE: NEGATIVE
Hgb urine dipstick: NEGATIVE
Ketones, ur: NEGATIVE
Leukocytes, UA: NEGATIVE
Nitrite: NEGATIVE
PH: 6 (ref 5.0–8.0)
Specific Gravity, Urine: 1.02 (ref 1.000–1.030)
Total Protein, Urine: NEGATIVE
UROBILINOGEN UA: 0.2 (ref 0.0–1.0)
Urine Glucose: NEGATIVE

## 2017-06-15 LAB — CBC WITH DIFFERENTIAL/PLATELET
BASOS ABS: 0.1 10*3/uL (ref 0.0–0.1)
Basophils Relative: 1.2 % (ref 0.0–3.0)
EOS PCT: 4.3 % (ref 0.0–5.0)
Eosinophils Absolute: 0.3 10*3/uL (ref 0.0–0.7)
HEMATOCRIT: 38.2 % (ref 36.0–46.0)
Hemoglobin: 12.6 g/dL (ref 12.0–15.0)
LYMPHS ABS: 1.5 10*3/uL (ref 0.7–4.0)
LYMPHS PCT: 20.6 % (ref 12.0–46.0)
MCHC: 32.9 g/dL (ref 30.0–36.0)
MCV: 87.7 fl (ref 78.0–100.0)
MONOS PCT: 4.6 % (ref 3.0–12.0)
Monocytes Absolute: 0.3 10*3/uL (ref 0.1–1.0)
NEUTROS ABS: 5 10*3/uL (ref 1.4–7.7)
NEUTROS PCT: 69.3 % (ref 43.0–77.0)
PLATELETS: 323 10*3/uL (ref 150.0–400.0)
RBC: 4.35 Mil/uL (ref 3.87–5.11)
RDW: 15.4 % (ref 11.5–15.5)
WBC: 7.3 10*3/uL (ref 4.0–10.5)

## 2017-06-15 LAB — LIPID PANEL
CHOL/HDL RATIO: 5
Cholesterol: 201 mg/dL — ABNORMAL HIGH (ref 0–200)
HDL: 41.4 mg/dL (ref >39.00)
LDL CALC: 141 mg/dL — AB (ref 0–99)
NONHDL: 159.33
Triglycerides: 94 mg/dL (ref 0.0–149.0)
VLDL: 18.8 mg/dL (ref 0.0–40.0)

## 2017-06-15 LAB — MICROALBUMIN / CREATININE URINE RATIO
Creatinine,U: 173.5 mg/dL
MICROALB UR: 0.7 mg/dL (ref 0.0–1.9)
Microalb Creat Ratio: 0.4 mg/g (ref 0.0–30.0)

## 2017-06-15 LAB — BASIC METABOLIC PANEL
BUN: 8 mg/dL (ref 6–23)
CALCIUM: 9.3 mg/dL (ref 8.4–10.5)
CO2: 29 mEq/L (ref 19–32)
CREATININE: 0.83 mg/dL (ref 0.40–1.20)
Chloride: 106 mEq/L (ref 96–112)
GFR: 96.29 mL/min (ref >60.00)
Glucose, Bld: 98 mg/dL (ref 70–99)
Potassium: 4 mEq/L (ref 3.5–5.1)
SODIUM: 139 meq/L (ref 135–145)

## 2017-06-15 LAB — TSH: TSH: 1.59 u[IU]/mL (ref 0.35–4.50)

## 2017-06-15 LAB — HEMOGLOBIN A1C: Hgb A1c MFr Bld: 6.1 % (ref 4.6–6.5)

## 2017-06-15 MED ORDER — OMEPRAZOLE 40 MG PO CPDR: 40.0000 mg | DELAYED_RELEASE_CAPSULE | Freq: Every day | ORAL | 3 refills | Status: DC

## 2017-06-15 MED ORDER — ROSUVASTATIN CALCIUM 20 MG PO TABS: 20.0000 mg | ORAL_TABLET | Freq: Every day | ORAL | 3 refills | Status: DC

## 2017-06-15 NOTE — Assessment & Plan Note (Signed)
BP Readings from Last 3 Encounters:  06/15/17 120/84  05/14/17 126/84  04/07/17 138/88  stable overall by history and exam, recent data reviewed with pt, and pt to continue medical treatment as before,  to f/u any worsening symptoms or concerns

## 2017-06-15 NOTE — Patient Instructions (Addendum)
You had the flu shot today  Your FMLA lke form was filled out today  Please continue all other medications as before, and refills have been done if requested.  Please have the pharmacy call with any other refills you may need.  Please continue your efforts at being more active, low cholesterol diet, and weight control.  You are otherwise up to date with prevention measures today.  Please keep your appointments with your specialists as you may have planned  Please go to the LAB in the Basement (turn left off the elevator) for the tests to be done today  You will be contacted by phone if any changes need to be made immediately.  Otherwise, you will receive a letter about your results with an explanation, but please check with MyChart first.  Please remember to sign up for MyChart if you have not done so, as this will be important to you in the future with finding out test results, communicating by private email, and scheduling acute appointments online when needed.  Please return in 6 months, or sooner if needed, with Lab testing done 3-5 days before

## 2017-06-15 NOTE — Progress Notes (Signed)
Subjective:    Patient ID: Nicole Melendez, female    DOB: 06-Jun-1976, 41 y.o.   MRN: [id number]  HPI  Here for wellness and f/u;  Overall doing ok;  Pt denies Chest pain, worsening SOB, DOE, wheezing, orthopnea, PND, worsening LE edema, palpitations, dizziness or syncope.  Pt denies neurological change such as new headache, facial or extremity weakness.  Pt denies polydipsia, polyuria, or low sugar symptoms. Pt states overall good compliance with treatment and medications, good tolerability, and has been trying to follow appropriate diet.  Pt denies worsening depressive symptoms, suicidal ideation or panic. No fever, night sweats, wt loss, loss of appetite, or other constitutional symptoms.  Pt states good ability with ADL's, has low fall risk, home safety reviewed and adequate, no other significant changes in hearing or vision, and not active with exercise, plans to try to be more active soon. Plans to f/u with GI regarding recent pancreatitis.  Asks for STD check as she has new partner, denies rash or d/c.   Past Medical History:  Diagnosis Date  . Allergic rhinitis, cause unspecified   . Anemia   . Anxiety   . Depression   . Diabetes mellitus type II    steroid related, patient states "im no longer diabetic"  . Essential hypertension 08/08/2015  . GERD (gastroesophageal reflux disease)   . History of blood transfusion   . Hyperlipidemia   . Menorrhagia   . Migraine   . Morbid obesity ([place])   . Otitis media 06/28/2015  . Peptic ulcer disease   . Positive ANA (antinuclear antibody) 02/14/2012  . Sarcoid    including hand per rheumatology-Dr. Amil Amen  . Sarcoidosis of lung ([place])   . Shortness of breath    on exertion  . Varicose veins with pain    Past Surgical History:  Procedure Laterality Date  . ABDOMINAL HYSTERECTOMY    . COLONOSCOPY WITH PROPOFOL N/A 10/19/2016   Procedure: COLONOSCOPY WITH PROPOFOL;  Surgeon: Mauri Pole, MD;  Location: [facility];  Service:  Endoscopy;  Laterality: N/A;  . ESOPHAGOGASTRODUODENOSCOPY (EGD) WITH PROPOFOL N/A 10/19/2016   Procedure: ESOPHAGOGASTRODUODENOSCOPY (EGD) WITH PROPOFOL;  Surgeon: Mauri Pole, MD;  Location: [facility];  Service: Endoscopy;  Laterality: N/A;  . HERNIA MESH REMOVAL  02/2013  . uterine ablation  03/2010  . WISDOM TOOTH EXTRACTION      reports that she quit smoking about 4 years ago. Her smoking use included Cigarettes. She has a 20.00 pack-year smoking history. She has never used smokeless tobacco. She reports that she does not drink alcohol or use drugs. family history includes Allergies in her mother; Bone cancer in her maternal aunt; Breast cancer in her maternal aunt; Diabetes in her father and mother; Heart attack in her mother; Heart disease in her father; Hypertension in her unknown relative; Lung cancer in her maternal aunt; Ovarian cancer in her maternal aunt; Rheum arthritis in her father; Stroke in her father and maternal uncle. Allergies  Allergen Reactions  . Azithromycin Hives    (z pak) hives  . Bee Venom Anaphylaxis  . Flagyl [Metronidazole] Hives and Shortness Of Breath  . Penicillins Shortness Of Breath and Swelling    Has patient had a PCN reaction causing immediate rash, facial/tongue/throat swelling, SOB or lightheadedness with hypotension: Yes Has patient had a PCN reaction causing severe rash involving mucus membranes or skin necrosis: No Has patient had a PCN reaction that required hospitalization No Has patient had a PCN reaction occurring within  the last 10 years: No If all of the above answers are "NO", then may proceed with Cephalosporin use.  REACTION: swelling and difficulty breathing  . Shellfish Allergy Anaphylaxis  . Klonopin [Clonazepam] Other (See Comments)    Memory difficulty   Current Outpatient Prescriptions on File Prior to Visit  Medication Sig Dispense Refill  . acetaminophen (TYLENOL) 500 MG tablet Take 1,000 mg by mouth every 6 (six)  hours as needed for mild pain or moderate pain.    Marland Kitchen alendronate (FOSAMAX) 70 MG tablet Take 1 tablet (70 mg total) by mouth every 7 (seven) days. 4 tablet 11  . ALPRAZolam (XANAX) 0.5 MG tablet TAKE 1 TABLET BY MOUTH THREE TIMES DAILY AS NEEDED 90 tablet 2  . azelastine (ASTELIN) 0.1 % nasal spray Place 2 sprays into both nostrils 2 (two) times daily. Use in each nostril as directed 30 mL 12  . Blood Glucose Monitoring Suppl (ONE TOUCH ULTRA 2) W/DEVICE KIT Use as directed four times daily. Dx: E11.9 1 each 0  . budesonide-formoterol (SYMBICORT) 160-4.5 MCG/ACT inhaler Inhale 2 puffs into the lungs 2 (two) times daily. 1 Inhaler 5  . cetirizine (ZYRTEC) 10 MG tablet TAKE 1 TABLET(10 MG) BY MOUTH DAILY 90 tablet 0  . cyclobenzaprine (FLEXERIL) 5 MG tablet Take 1 tablet (5 mg total) by mouth at bedtime as needed for muscle spasms. (Patient taking differently: Take 10 mg by mouth at bedtime as needed for muscle spasms. ) 30 tablet 3  . folic acid (FOLVITE) 1 MG tablet TAKE 2 TABLETS BY MOUTH DAILY 180 tablet 3  . furosemide (LASIX) 40 MG tablet 1 tab by mouth every day in the AM and then 1 as needed only for persistent swelling in the PM 60 tablet 11  . gabapentin (NEURONTIN) 300 MG capsule TAKE 1 CAPSULE(300 MG) BY MOUTH THREE TIMES DAILY 90 capsule 5  . glucose blood (ONE TOUCH ULTRA TEST) test strip Use to check blood sugars four times a day Dx E11.9 400 each 11  . HUMALOG 100 UNIT/ML injection INJECT UP TO 12 UNITS INTO THE SKIN THREE TIMES DAILY WITH MEALS WITH SLIDING SCALE AS DIRECTED (Patient taking differently: Inject 2 to 12 units 3 times a day as needed for high blood sugar) 10 mL 0  . Insulin Syringe-Needle U-100 25G X 1" 1 ML MISC Use to administer humalog insulin three times a day Dx E11.9 100 each 5  . levalbuterol (XOPENEX HFA) 45 MCG/ACT inhaler Inhale 1-2 puffs into the lungs every 8 (eight) hours as needed for wheezing or shortness of breath. 1 Inhaler 5  . lidocaine (XYLOCAINE) 2 %  solution Use as directed 20 mLs in the mouth or throat as needed for mouth pain. 100 mL 1  . methotrexate (RHEUMATREX) 2.5 MG tablet Take 15 mg by mouth every Tuesday.     . naproxen (NAPROSYN) 500 MG tablet Take 1 tablet (500 mg total) by mouth 2 (two) times daily as needed. 60 tablet 5  . ondansetron (ZOFRAN-ODT) 8 MG disintegrating tablet Take 1 tablet (8 mg total) by mouth every 8 (eight) hours as needed for nausea or vomiting. 20 tablet 0  . ONE TOUCH ULTRA TEST test strip TEST BLOOD SUGAR FOUR TIMES DAILY 400 each 0  . potassium chloride (K-DUR) 10 MEQ tablet 3 tab by mouth per day only when taking the lasix (Patient taking differently: Take 10-20 mEq by mouth 2 (two) times daily. Take 10 meq in the morning and 18mq at night) 90 tablet  11  . ranitidine (ZANTAC) 150 MG tablet TAKE 1 TABLET(150 MG) BY MOUTH AT BEDTIME 30 tablet 0  . sertraline (ZOLOFT) 100 MG tablet TAKE 2 TABLETS BY MOUTH DAILY 180 tablet 3  . SUMAtriptan (IMITREX) 100 MG tablet Take 1 tablet by mouth at the onset of a headache. May repeat in 2 hours if headache persists or recurs. 10 tablet 2  . telmisartan (MICARDIS) 40 MG tablet TAKE 1 TABLET(40 MG) BY MOUTH DAILY 90 tablet 0  . TURMERIC PO Take 1 tablet by mouth daily.    . vitamin B-12 (CYANOCOBALAMIN) 1000 MCG tablet Take 1,000 mcg by mouth daily.    . Vitamin D, Ergocalciferol, (DRISDOL) 50000 units CAPS capsule TAKE 1 CAPSULE BY MOUTH EVERY 7 DAYS 12 capsule 0  . zonisamide (ZONEGRAN) 100 MG capsule Take 1 capsule (100 mg total) by mouth daily. 90 capsule 0  . zonisamide (ZONEGRAN) 100 MG capsule TAKE 1 CAPSULE(100 MG) BY MOUTH DAILY 30 capsule 2   No current facility-administered medications on file prior to visit.    Review of Systems Constitutional: Negative for other unusual diaphoresis, sweats, appetite or weight changes HENT: Negative for other worsening hearing loss, ear pain, facial swelling, mouth sores or neck stiffness.   Eyes: Negative for other  worsening pain, redness or other visual disturbance.  Respiratory: Negative for other stridor or swelling Cardiovascular: Negative for other palpitations or other chest pain  Gastrointestinal: Negative for worsening diarrhea or loose stools, blood in stool, distention or other pain Genitourinary: Negative for hematuria, flank pain or other change in urine volume.  Musculoskeletal: Negative for myalgias or other joint swelling.  Skin: Negative for other color change, or other wound or worsening drainage.  Neurological: Negative for other syncope or numbness. Hematological: Negative for other adenopathy or swelling Psychiatric/Behavioral: Negative for hallucinations, other worsening agitation, SI, self-injury, or new decreased concentration All other system neg per pt    Objective:   Physical Exam BP 120/84   Pulse 95   Temp 98.3 F (36.8 C)   Ht '5\' 7"'  (1.702 m)   Wt (!) 322 lb (146.1 kg)   LMP 07/17/2012   SpO2 99%   BMI 50.43 kg/m  VS noted, supermorbid obese Constitutional: Pt is oriented to person, place, and time. Appears well-developed and well-nourished, in no significant distress and comfortable Head: Normocephalic and atraumatic  Eyes: Conjunctivae and EOM are normal. Pupils are equal, round, and reactive to light Right Ear: External ear normal without discharge Left Ear: External ear normal without discharge Nose: Nose without discharge or deformity Mouth/Throat: Oropharynx is without other ulcerations and moist  Neck: Normal range of motion. Neck supple. No JVD present. No tracheal deviation present or significant neck LA or mass Cardiovascular: Normal rate, regular rhythm, normal heart sounds and intact distal pulses.   Pulmonary/Chest: WOB normal and breath sounds without rales or wheezing  Abdominal: Soft. Bowel sounds are normal. NT. No HSM  Musculoskeletal: Normal range of motion. Exhibits no edema Lymphadenopathy: Has no other cervical adenopathy.  Neurological:  Pt is alert and oriented to person, place, and time. Pt has normal reflexes. No cranial nerve deficit. Motor grossly intact, Gait intact Skin: Skin is warm and dry. No rash noted or new ulcerations Psychiatric:  Has mild nervous mood and affect. Behavior is normal without agitation No other exam findings Lab Results  Component Value Date   WBC 7.3 06/15/2017   HGB 12.6 06/15/2017   HCT 38.2 06/15/2017   PLT 323.0 06/15/2017  GLUCOSE 98 06/15/2017   CHOL 201 (H) 06/15/2017   TRIG 94.0 06/15/2017   HDL 41.40 06/15/2017   LDLDIRECT 135.0 07/05/2011   LDLCALC 141 (H) 06/15/2017   ALT 10 06/15/2017   AST 11 06/15/2017   NA 139 06/15/2017   K 4.0 06/15/2017   CL 106 06/15/2017   CREATININE 0.83 06/15/2017   BUN 8 06/15/2017   CO2 29 06/15/2017   TSH 1.59 06/15/2017   INR 1.3 11/17/2012   HGBA1C 6.1 06/15/2017   MICROALBUR 0.7 06/15/2017      Assessment & Plan:

## 2017-06-15 NOTE — Assessment & Plan Note (Signed)
stable overall by history and exam, recent data reviewed with pt, and pt to continue medical treatment as before,  to f/u any worsening symptoms or concerns Lab Results  Component Value Date   HGBA1C 6.1 06/15/2017

## 2017-06-15 NOTE — Assessment & Plan Note (Signed)
Asympt, for STD labs per pt request as ordered including HIV screen

## 2017-06-15 NOTE — Assessment & Plan Note (Addendum)
FMLA style form signed today;  In addition to the time spent performing CPE, I spent an additional 15 minutes face to face,in which greater than 50% of this time was spent in counseling and coordination of care for patient's illness as documented, including the differential dx, tx, further evaluation and management of Sarcoidosis, DM with neuropathy, HTN, and possible STD exposure and the need for safe sex

## 2017-06-16 LAB — HEPATITIS PANEL, ACUTE
HCV Ab: NONREACTIVE
Hep A IgM: 0
Hep B C IgM: NONREACTIVE
Hepatitis B Surface Ag: NONREACTIVE

## 2017-06-16 LAB — SYPHILIS: RPR W/REFLEX TO RPR TITER AND TREPONEMAL ANTIBODIES, TRADITIONAL SCREENING AND DIAGNOSIS ALGORITHM

## 2017-06-16 LAB — HIV ANTIBODY (ROUTINE TESTING W REFLEX): HIV: NONREACTIVE

## 2017-06-16 LAB — HSV 2 ANTIBODY, IGG: HSV 2 Glycoprotein G Ab, IgG: <0.9 Index (ref <0.90)

## 2017-06-17 LAB — GC/CHLAMYDIA PROBE AMP
CT Probe RNA: NOT DETECTED
GC Probe RNA: NOT DETECTED

## 2017-06-22 ENCOUNTER — Ambulatory Visit (INDEPENDENT_AMBULATORY_CARE_PROVIDER_SITE_OTHER): Payer: BLUE CROSS/BLUE SHIELD | Admitting: Acute Care

## 2017-06-22 ENCOUNTER — Encounter: Payer: Self-pay | Admitting: Acute Care

## 2017-06-22 DIAGNOSIS — D869 Sarcoidosis, unspecified: Secondary | ICD-10-CM

## 2017-06-22 DIAGNOSIS — B370 Candidal stomatitis: Secondary | ICD-10-CM | POA: Diagnosis not present

## 2017-06-22 DIAGNOSIS — M2550 Pain in unspecified joint: Secondary | ICD-10-CM | POA: Diagnosis not present

## 2017-06-22 MED ORDER — PREDNISONE 10 MG PO TABS: ORAL_TABLET | ORAL | 0 refills | Status: DC

## 2017-06-22 MED ORDER — NYSTATIN 100000 UNIT/ML MT SUSP: OROMUCOSAL | 2 refills | Status: DC

## 2017-06-22 NOTE — Assessment & Plan Note (Signed)
Patient states she is having a sarcoid flare Pain in joints specifically hands and ankles Plan Prednisone taper; 10 mg tablets: 4 tabs x 2 days, 3 tabs x 2 days, 2 tabs x 2 days 1 tab x 2 days then stop. Follow up with Dr. Jenny Reichmann in the morning as scheduled. Watch blood sugars while on prednisone taper. Follow-up with Dr. Lamonte Sakai in 1 year, or sooner if needed Please contact office for sooner follow up if symptoms do not improve or worsen or seek emergency care

## 2017-06-22 NOTE — Assessment & Plan Note (Signed)
Body mass index is 50.59 kg/m. Continue to work on weight loss, increasing activity as able

## 2017-06-22 NOTE — Progress Notes (Signed)
 History of Present Illness Nicole Melendez is a 41 y.o. female with former smoker ( 20 pack years quit 2014) with sarcoidosis. She is followed by Dr. Byrum.   06/22/2017 Annual Follow Up: Pt. Presents for follow up. She states she is in the midst of having a flare. She remains on Methotrexate 6 mg  injections once weekly per her rheumatologist. She is no longer on daily prednisone..  She states her ankles and hands are aching. She has sprained her anlke with a fall . She is not having any pulmonary symptoms at present. She has an appointment with her PCP 06/23/2017 to address joint and muscle pain..She denies any rash.She states she has had her eyes checked recently, confirming No sarcoid changes noted. She is compliant with her Symbicort and Pro Air. She uses her rescue inhaler once every other month on average. From a pulmonary standpoint she has no acute issues She denies fever, chest pain, orthopnea or hemoptysis.  Test Results:  CBC Latest Ref Rng & Units 06/15/2017 04/07/2017 10/31/2016  WBC 4.0 - 10.5 K/uL 7.3 5.8 7.1  Hemoglobin 12.0 - 15.0 g/dL 12.6 12.7 12.6  Hematocrit 36.0 - 46.0 % 38.2 38.2 36.8  Platelets 150.0 - 400.0 K/uL 323.0 291.0 322    BMP Latest Ref Rng & Units 06/15/2017 04/07/2017 10/31/2016  Glucose 70 - 99 mg/dL 98 98 101(H)  BUN 6 - 23 mg/dL 8 8 8  Creatinine 0.40 - 1.20 mg/dL 0.83 0.79 0.80  Sodium 135 - 145 mEq/L 139 139 134(L)  Potassium 3.5 - 5.1 mEq/L 4.0 4.0 4.3  Chloride 96 - 112 mEq/L 106 106 101  CO2 19 - 32 mEq/L 29 27 25  Calcium 8.4 - 10.5 mg/dL 9.3 9.8 9.1     PFT    Component Value Date/Time   FEV1PRE 2.28 09/23/2015 0843   FEV1POST 2.31 09/23/2015 0843   FVCPRE 2.71 09/23/2015 0843   FVCPOST 2.77 09/23/2015 0843   TLC 4.11 09/23/2015 0843   DLCOUNC 17.40 09/23/2015 0843   PREFEV1FVCRT 84 09/23/2015 0843   PSTFEV1FVCRT 84 09/23/2015 0843      Past medical hx Past Medical History:  Diagnosis Date  . Allergic rhinitis, cause unspecified     . Anemia   . Anxiety   . Depression   . Diabetes mellitus type II    steroid related, patient states "im no longer diabetic"  . Essential hypertension 08/08/2015  . GERD (gastroesophageal reflux disease)   . History of blood transfusion   . Hyperlipidemia   . Menorrhagia   . Migraine   . Morbid obesity (HCC)   . Otitis media 06/28/2015  . Peptic ulcer disease   . Positive ANA (antinuclear antibody) 02/14/2012  . Sarcoid    including hand per rheumatology-Dr. Beekman  . 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 Shortness of breath    on exertion  . Varicose veins with pain      Social History  Substance Use Topics  . Smoking status: Former Smoker    Packs/day: 1.00    Years: 20.00    Types: Cigarettes    Quit date: 10/25/2012  . Smokeless tobacco: Never Used     Comment: QUIT 04/2010 AND STARTED BACK 2014 X 3 MONTHS. less than 1 ppd.  started at age 16.    . Alcohol use No    Ms.Stolz reports that she quit smoking about 4 years ago. Her smoking use included Cigarettes. She has a 20.00 pack-year smoking history. She has   never used smokeless tobacco. She reports that she does not drink alcohol or use drugs.  Tobacco Cessation: Patient is a former smoker who quit smoking in 2014. She had a 20-pack-year smoking history  Past surgical hx, Family hx, Social hx all reviewed.  Current Outpatient Prescriptions on File Prior to Visit  Medication Sig  . acetaminophen (TYLENOL) 500 MG tablet Take 1,000 mg by mouth every 6 (six) hours as needed for mild pain or moderate pain.  . alendronate (FOSAMAX) 70 MG tablet Take 1 tablet (70 mg total) by mouth every 7 (seven) days.  . ALPRAZolam (XANAX) 0.5 MG tablet TAKE 1 TABLET BY MOUTH THREE TIMES DAILY AS NEEDED  . azelastine (ASTELIN) 0.1 % nasal spray Place 2 sprays into both nostrils 2 (two) times daily. Use in each nostril as directed  . Blood Glucose Monitoring Suppl (ONE TOUCH ULTRA 2) W/DEVICE KIT Use as directed four times daily. Dx: E11.9  .  budesonide-formoterol (SYMBICORT) 160-4.5 MCG/ACT inhaler Inhale 2 puffs into the lungs 2 (two) times daily.  . cetirizine (ZYRTEC) 10 MG tablet TAKE 1 TABLET(10 MG) BY MOUTH DAILY  . cyclobenzaprine (FLEXERIL) 5 MG tablet Take 1 tablet (5 mg total) by mouth at bedtime as needed for muscle spasms. (Patient taking differently: Take 10 mg by mouth at bedtime as needed for muscle spasms. )  . folic acid (FOLVITE) 1 MG tablet TAKE 2 TABLETS BY MOUTH DAILY  . furosemide (LASIX) 40 MG tablet 1 tab by mouth every day in the AM and then 1 as needed only for persistent swelling in the PM  . gabapentin (NEURONTIN) 300 MG capsule TAKE 1 CAPSULE(300 MG) BY MOUTH THREE TIMES DAILY  . glucose blood (ONE TOUCH ULTRA TEST) test strip Use to check blood sugars four times a day Dx E11.9  . HUMALOG 100 UNIT/ML injection INJECT UP TO 12 UNITS INTO THE SKIN THREE TIMES DAILY WITH MEALS WITH SLIDING SCALE AS DIRECTED (Patient taking differently: Inject 2 to 12 units 3 times a day as needed for high blood sugar)  . Insulin Syringe-Needle U-100 25G X 1" 1 ML MISC Use to administer humalog insulin three times a day Dx E11.9  . levalbuterol (XOPENEX HFA) 45 MCG/ACT inhaler Inhale 1-2 puffs into the lungs every 8 (eight) hours as needed for wheezing or shortness of breath.  . lidocaine (XYLOCAINE) 2 % solution Use as directed 20 mLs in the mouth or throat as needed for mouth pain.  . methotrexate (RHEUMATREX) 2.5 MG tablet Take 15 mg by mouth every Tuesday.   . naproxen (NAPROSYN) 500 MG tablet Take 1 tablet (500 mg total) by mouth 2 (two) times daily as needed.  . omeprazole (PRILOSEC) 40 MG capsule Take 1 capsule (40 mg total) by mouth daily.  . ondansetron (ZOFRAN-ODT) 8 MG disintegrating tablet Take 1 tablet (8 mg total) by mouth every 8 (eight) hours as needed for nausea or vomiting.  . ONE TOUCH ULTRA TEST test strip TEST BLOOD SUGAR FOUR TIMES DAILY  . potassium chloride (K-DUR) 10 MEQ tablet 3 tab by mouth per day only  when taking the lasix (Patient taking differently: Take 10-20 mEq by mouth 2 (two) times daily. Take 10 meq in the morning and 20meq at night)  . ranitidine (ZANTAC) 150 MG tablet TAKE 1 TABLET(150 MG) BY MOUTH AT BEDTIME  . rosuvastatin (CRESTOR) 20 MG tablet Take 1 tablet (20 mg total) by mouth daily.  . sertraline (ZOLOFT) 100 MG tablet TAKE 2 TABLETS BY MOUTH DAILY  .   SUMAtriptan (IMITREX) 100 MG tablet Take 1 tablet by mouth at the onset of a headache. May repeat in 2 hours if headache persists or recurs.  . telmisartan (MICARDIS) 40 MG tablet TAKE 1 TABLET(40 MG) BY MOUTH DAILY  . TURMERIC PO Take 1 tablet by mouth daily.  . vitamin B-12 (CYANOCOBALAMIN) 1000 MCG tablet Take 1,000 mcg by mouth daily.  . Vitamin D, Ergocalciferol, (DRISDOL) 50000 units CAPS capsule TAKE 1 CAPSULE BY MOUTH EVERY 7 DAYS  . zonisamide (ZONEGRAN) 100 MG capsule Take 1 capsule (100 mg total) by mouth daily.  . zonisamide (ZONEGRAN) 100 MG capsule TAKE 1 CAPSULE(100 MG) BY MOUTH DAILY   No current facility-administered medications on file prior to visit.      Allergies  Allergen Reactions  . Azithromycin Hives    (z pak) hives  . Bee Venom Anaphylaxis  . Flagyl [Metronidazole] Hives and Shortness Of Breath  . Penicillins Shortness Of Breath and Swelling    Has patient had a PCN reaction causing immediate rash, facial/tongue/throat swelling, SOB or lightheadedness with hypotension: Yes Has patient had a PCN reaction causing severe rash involving mucus membranes or skin necrosis: No Has patient had a PCN reaction that required hospitalization No Has patient had a PCN reaction occurring within the last 10 years: No If all of the above answers are "NO", then may proceed with Cephalosporin use.  REACTION: swelling and difficulty breathing  . Shellfish Allergy Anaphylaxis  . Klonopin [Clonazepam] Other (See Comments)    Memory difficulty    Review Of Systems:  Constitutional:   No  weight loss, night  sweats,  Fevers, chills, +fatigue, or  lassitude.  HEENT:   No headaches,  Difficulty swallowing,  Tooth/dental problems, or  Sore throat,                No sneezing, itching, ear ache, nasal congestion, post nasal drip,   CV:  No chest pain,  Orthopnea, PND, swelling in lower extremities, anasarca, dizziness, palpitations, syncope.   GI  No heartburn, indigestion, abdominal pain, nausea, vomiting, diarrhea, change in bowel habits, loss of appetite, bloody stools.   Resp: No shortness of breath with exertion or at rest.  No excess mucus, no productive cough,  No non-productive cough,  No coughing up of blood.  No change in color of mucus.  No wheezing.  No chest wall deformity  Skin: no rash or lesions.  GU: no dysuria, change in color of urine, no urgency or frequency.  No flank pain, no hematuria   MS:  + joint pain ( Hands and ankles) or swelling.  + decreased range of motion.  No back pain.  Psych:  No change in mood or affect. No depression or anxiety.  No memory loss.   Vital Signs BP (!) 124/92 (BP Location: Left Arm, Cuff Size: Large)   Pulse 92   Ht 5' 7" (1.702 m)   Wt (!) 323 lb (146.5 kg)   LMP 07/17/2012   SpO2 98%   BMI 50.59 kg/m    Physical Exam:  General- No distress,  A&Ox3, pleasant ENT: No sinus tenderness, TM clear, pale nasal mucosa, no oral exudate,no post nasal drip, no LAN Cardiac: S1, S2, regular rate and rhythm, no murmur Chest: No wheeze/ rales/ dullness; no accessory muscle use, no nasal flaring, no sternal retractions Abd.: Soft Non-tender, obese Ext: No clubbing cyanosis, trace bilateral lower extremity edema, Neuro:  normal strength, deconditioned at baseline Skin: No rashes, warm and dry, no lesions   noted Psych: normal mood and behavior   Assessment/Plan  Severe obesity (BMI >= 40) (HCC) Body mass index is 50.59 kg/m. Continue to work on weight loss, increasing activity as able  Pain in joint, multiple sites Patient states she is  having a sarcoid flare Pain in joints specifically hands and ankles Plan Prednisone taper; 10 mg tablets: 4 tabs x 2 days, 3 tabs x 2 days, 2 tabs x 2 days 1 tab x 2 days then stop. Follow up with Dr. John in the morning as scheduled. Watch blood sugars while on prednisone taper. Follow-up with Dr. Byrum in 1 year, or sooner if needed Please contact office for sooner follow up if symptoms do not improve or worsen or seek emergency care    Oral thrush Immunosuppressed Patient has oral thrush Plan Nystatin mouthwash 3 times a day 1 week   Sarcoidosis of the lung Stable on current regimen Plan Continue methotrexate injections once weekly as you have been doing Continue Symbicort 2 puffs twice daily Rinse mouth after use Continue Zyrtec for allergic rhinitis Continue Xopenex as rescue inhaler Continue annual eye exams Follow up in 1 year or sooner if needed.    F , NP 06/22/2017  10:08 PM          

## 2017-06-22 NOTE — Patient Instructions (Addendum)
It is good to see you today. We will send in a RX for nystatin mouth wash. Use 3 times daily for thrush. Prednisone taper; 10 mg tablets: 4 tabs x 2 days, 3 tabs x 2 days, 2 tabs x 2 days 1 tab x 2 days then stop. Follow up with Dr. Jenny Reichmann in the morning as scheduled. Watch blood sugars while on prednisone taper. Continue Symbicort daily as you have been doing Rinse mouth after use Continue Zyrtec for allergic rhinitis Continue Xopenex as rescue inhaler Follow up in one year. Please contact office for sooner follow up if symptoms do not improve or worsen or seek emergency care

## 2017-06-22 NOTE — Assessment & Plan Note (Signed)
Immunosuppressed Patient has oral thrush Plan Nystatin mouthwash 3 times a day 1 week

## 2017-06-23 ENCOUNTER — Ambulatory Visit: Payer: Self-pay | Admitting: Internal Medicine

## 2017-06-23 ENCOUNTER — Encounter: Payer: Self-pay | Admitting: Internal Medicine

## 2017-06-23 ENCOUNTER — Ambulatory Visit (INDEPENDENT_AMBULATORY_CARE_PROVIDER_SITE_OTHER): Payer: BLUE CROSS/BLUE SHIELD | Admitting: Internal Medicine

## 2017-06-23 VITALS — BP 122/84 | HR 95 | Temp 98.0°F | Ht 67.0 in | Wt 322.0 lb

## 2017-06-23 DIAGNOSIS — M2550 Pain in unspecified joint: Secondary | ICD-10-CM | POA: Diagnosis not present

## 2017-06-23 DIAGNOSIS — D869 Sarcoidosis, unspecified: Secondary | ICD-10-CM

## 2017-06-23 DIAGNOSIS — K859 Acute pancreatitis without necrosis or infection, unspecified: Secondary | ICD-10-CM

## 2017-06-23 DIAGNOSIS — S93491A Sprain of other ligament of right ankle, initial encounter: Secondary | ICD-10-CM | POA: Diagnosis not present

## 2017-06-23 NOTE — Patient Instructions (Addendum)
Please see our [facility] for your GI referral done on April 07, 2017  Please continue all other medications as before, and refills have been done if requested.  Please have the pharmacy call with any other refills you may need.  Please continue your efforts at being more active, low cholesterol diabetic diet, and weight control.  Please keep your appointments with your specialists as you may have planned  Your form will be filled out

## 2017-06-23 NOTE — Progress Notes (Signed)
Subjective:    Patient ID: Nicole Melendez, female    DOB: 05/31/1976, 41 y.o.   MRN: [id number]  HPI  Here to f/u, needs FMLA form for work missed in the last 3 months, starting with pancreatitis episode 6/12 - 6/19, July 12-17 sarcoid flare, and most recent 8/27-sept 4 polyarthritis.  Now improved but will need f/u with rheum Dr Amil Amen soon. Pt denies chest pain, increased sob or doe, wheezing, orthopnea, PND, increased LE swelling, palpitations, dizziness or syncope.   Pt denies polydipsia, polyuria  Denies worsening reflux, abd pain, dysphagia, n/v, bowel change or blood.  Also incidentally turned the right ankle yesterday but can walk on it, no bruising , mild pain and swelling persists today Past Medical History:  Diagnosis Date  . Allergic rhinitis, cause unspecified   . Anemia   . Anxiety   . Depression   . Diabetes mellitus type II    steroid related, patient states "im no longer diabetic"  . Essential hypertension 08/08/2015  . GERD (gastroesophageal reflux disease)   . History of blood transfusion   . Hyperlipidemia   . Menorrhagia   . Migraine   . Morbid obesity ([place])   . Otitis media 06/28/2015  . Peptic ulcer disease   . Positive ANA (antinuclear antibody) 02/14/2012  . Sarcoid    including hand per rheumatology-Dr. Amil Amen  . Sarcoidosis of lung ([place])   . Shortness of breath    on exertion  . Varicose veins with pain    Past Surgical History:  Procedure Laterality Date  . ABDOMINAL HYSTERECTOMY    . COLONOSCOPY WITH PROPOFOL N/A 10/19/2016   Procedure: COLONOSCOPY WITH PROPOFOL;  Surgeon: Mauri Pole, MD;  Location: [facility];  Service: Endoscopy;  Laterality: N/A;  . ESOPHAGOGASTRODUODENOSCOPY (EGD) WITH PROPOFOL N/A 10/19/2016   Procedure: ESOPHAGOGASTRODUODENOSCOPY (EGD) WITH PROPOFOL;  Surgeon: Mauri Pole, MD;  Location: [facility];  Service: Endoscopy;  Laterality: N/A;  . HERNIA MESH REMOVAL  02/2013  . uterine ablation  03/2010  . WISDOM  TOOTH EXTRACTION      reports that she quit smoking about 4 years ago. Her smoking use included Cigarettes. She has a 20.00 pack-year smoking history. She has never used smokeless tobacco. She reports that she does not drink alcohol or use drugs. family history includes Allergies in her mother; Bone cancer in her maternal aunt; Breast cancer in her maternal aunt; Diabetes in her father and mother; Heart attack in her mother; Heart disease in her father; Hypertension in her unknown relative; Lung cancer in her maternal aunt; Ovarian cancer in her maternal aunt; Rheum arthritis in her father; Stroke in her father and maternal uncle. Allergies  Allergen Reactions  . Azithromycin Hives    (z pak) hives  . Bee Venom Anaphylaxis  . Flagyl [Metronidazole] Hives and Shortness Of Breath  . Penicillins Shortness Of Breath and Swelling    Has patient had a PCN reaction causing immediate rash, facial/tongue/throat swelling, SOB or lightheadedness with hypotension: Yes Has patient had a PCN reaction causing severe rash involving mucus membranes or skin necrosis: No Has patient had a PCN reaction that required hospitalization No Has patient had a PCN reaction occurring within the last 10 years: No If all of the above answers are "NO", then may proceed with Cephalosporin use.  REACTION: swelling and difficulty breathing  . Shellfish Allergy Anaphylaxis  . Klonopin [Clonazepam] Other (See Comments)    Memory difficulty   Current Outpatient Prescriptions on File Prior to  Visit  Medication Sig Dispense Refill  . acetaminophen (TYLENOL) 500 MG tablet Take 1,000 mg by mouth every 6 (six) hours as needed for mild pain or moderate pain.    Marland Kitchen alendronate (FOSAMAX) 70 MG tablet Take 1 tablet (70 mg total) by mouth every 7 (seven) days. 4 tablet 11  . ALPRAZolam (XANAX) 0.5 MG tablet TAKE 1 TABLET BY MOUTH THREE TIMES DAILY AS NEEDED 90 tablet 2  . azelastine (ASTELIN) 0.1 % nasal spray Place 2 sprays into both  nostrils 2 (two) times daily. Use in each nostril as directed 30 mL 12  . Blood Glucose Monitoring Suppl (ONE TOUCH ULTRA 2) W/DEVICE KIT Use as directed four times daily. Dx: E11.9 1 each 0  . budesonide-formoterol (SYMBICORT) 160-4.5 MCG/ACT inhaler Inhale 2 puffs into the lungs 2 (two) times daily. 1 Inhaler 5  . cetirizine (ZYRTEC) 10 MG tablet TAKE 1 TABLET(10 MG) BY MOUTH DAILY 90 tablet 0  . cyclobenzaprine (FLEXERIL) 5 MG tablet Take 1 tablet (5 mg total) by mouth at bedtime as needed for muscle spasms. (Patient taking differently: Take 10 mg by mouth at bedtime as needed for muscle spasms. ) 30 tablet 3  . folic acid (FOLVITE) 1 MG tablet TAKE 2 TABLETS BY MOUTH DAILY 180 tablet 3  . furosemide (LASIX) 40 MG tablet 1 tab by mouth every day in the AM and then 1 as needed only for persistent swelling in the PM 60 tablet 11  . gabapentin (NEURONTIN) 300 MG capsule TAKE 1 CAPSULE(300 MG) BY MOUTH THREE TIMES DAILY 90 capsule 5  . glucose blood (ONE TOUCH ULTRA TEST) test strip Use to check blood sugars four times a day Dx E11.9 400 each 11  . HUMALOG 100 UNIT/ML injection INJECT UP TO 12 UNITS INTO THE SKIN THREE TIMES DAILY WITH MEALS WITH SLIDING SCALE AS DIRECTED (Patient taking differently: Inject 2 to 12 units 3 times a day as needed for high blood sugar) 10 mL 0  . Insulin Syringe-Needle U-100 25G X 1" 1 ML MISC Use to administer humalog insulin three times a day Dx E11.9 100 each 5  . levalbuterol (XOPENEX HFA) 45 MCG/ACT inhaler Inhale 1-2 puffs into the lungs every 8 (eight) hours as needed for wheezing or shortness of breath. 1 Inhaler 5  . lidocaine (XYLOCAINE) 2 % solution Use as directed 20 mLs in the mouth or throat as needed for mouth pain. 100 mL 1  . methotrexate (RHEUMATREX) 2.5 MG tablet Take 15 mg by mouth every Tuesday.     . naproxen (NAPROSYN) 500 MG tablet Take 1 tablet (500 mg total) by mouth 2 (two) times daily as needed. 60 tablet 5  . nystatin (MYCOSTATIN) 100000  UNIT/ML suspension Swish and swallow 67m four times daily x5-7days 140 mL 2  . omeprazole (PRILOSEC) 40 MG capsule Take 1 capsule (40 mg total) by mouth daily. 90 capsule 3  . ondansetron (ZOFRAN-ODT) 8 MG disintegrating tablet Take 1 tablet (8 mg total) by mouth every 8 (eight) hours as needed for nausea or vomiting. 20 tablet 0  . ONE TOUCH ULTRA TEST test strip TEST BLOOD SUGAR FOUR TIMES DAILY 400 each 0  . potassium chloride (K-DUR) 10 MEQ tablet 3 tab by mouth per day only when taking the lasix (Patient taking differently: Take 10-20 mEq by mouth 2 (two) times daily. Take 10 meq in the morning and 266m at night) 90 tablet 11  . predniSONE (DELTASONE) 10 MG tablet Take 4 tabs for 2 days,  then 3 tabs for 2 days, 2 tabs for 2 days, then 1 tab for 2 days, then stop. 20 tablet 0  . ranitidine (ZANTAC) 150 MG tablet TAKE 1 TABLET(150 MG) BY MOUTH AT BEDTIME 30 tablet 0  . rosuvastatin (CRESTOR) 20 MG tablet Take 1 tablet (20 mg total) by mouth daily. 90 tablet 3  . sertraline (ZOLOFT) 100 MG tablet TAKE 2 TABLETS BY MOUTH DAILY 180 tablet 3  . SUMAtriptan (IMITREX) 100 MG tablet Take 1 tablet by mouth at the onset of a headache. May repeat in 2 hours if headache persists or recurs. 10 tablet 2  . telmisartan (MICARDIS) 40 MG tablet TAKE 1 TABLET(40 MG) BY MOUTH DAILY 90 tablet 0  . TURMERIC PO Take 1 tablet by mouth daily.    . vitamin B-12 (CYANOCOBALAMIN) 1000 MCG tablet Take 1,000 mcg by mouth daily.    . Vitamin D, Ergocalciferol, (DRISDOL) 50000 units CAPS capsule TAKE 1 CAPSULE BY MOUTH EVERY 7 DAYS 12 capsule 0  . zonisamide (ZONEGRAN) 100 MG capsule Take 1 capsule (100 mg total) by mouth daily. 90 capsule 0   No current facility-administered medications on file prior to visit.    Review of Systems  Constitutional: Negative for other unusual diaphoresis or sweats HENT: Negative for ear discharge or swelling Eyes: Negative for other worsening visual disturbances Respiratory: Negative for  stridor or other swelling  Gastrointestinal: Negative for worsening distension or other blood Genitourinary: Negative for retention or other urinary change Musculoskeletal: Negative for other MSK pain or swelling Skin: Negative for color change or other new lesions Neurological: Negative for worsening tremors and other numbness  Psychiatric/Behavioral: Negative for worsening agitation or other fatigue All other system neg per pt    Objective:   Physical Exam BP 122/84   Pulse 95   Temp 98 F (36.7 C) (Oral)   Ht '5\' 7"'  (1.702 m)   Wt (!) 322 lb (146.1 kg)   LMP 07/17/2012   SpO2 98%   BMI 50.43 kg/m   VS noted,  Constitutional: Pt appears in NAD HENT: Head: NCAT.  Right Ear: External ear normal.  Left Ear: External ear normal.  Eyes: . Pupils are equal, round, and reactive to light. Conjunctivae and EOM are normal Nose: without d/c or deformity Neck: Neck supple. Gross normal ROM Cardiovascular: Normal rate and regular rhythm.   Pulmonary/Chest: Effort normal and breath sounds without rales or wheezing.  Abd:  Soft, NT, ND, + BS, no organomegaly Neurological: Pt is alert. At baseline orientation, motor grossly intact Skin: Skin is warm. No rashes, other new lesions, no LE edema Right ankle with lateral malleolar tender swelling trace to 1+ o/w neurovasc intact Psychiatric: Pt behavior is normal without agitation  No other exam findings    Assessment & Plan:

## 2017-06-26 DIAGNOSIS — M0520 Rheumatoid vasculitis with rheumatoid arthritis of unspecified site: Secondary | ICD-10-CM | POA: Insufficient documentation

## 2017-06-26 DIAGNOSIS — S93401A Sprain of unspecified ligament of right ankle, initial encounter: Secondary | ICD-10-CM | POA: Insufficient documentation

## 2017-06-26 DIAGNOSIS — K859 Acute pancreatitis without necrosis or infection, unspecified: Secondary | ICD-10-CM | POA: Insufficient documentation

## 2017-06-26 NOTE — Assessment & Plan Note (Signed)
[place] for [employer] form filled, out,  to f/u any worsening symptoms or concerns

## 2017-06-26 NOTE — Assessment & Plan Note (Signed)
Resolved, pt did not accept appt prior to GI, will ask for appt to be made

## 2017-06-26 NOTE — Assessment & Plan Note (Signed)
?   RA vs sarcoid related, for rheum f/u as planned

## 2017-06-26 NOTE — Assessment & Plan Note (Signed)
Mild, declines plain film, for pain control, ok for return to work sept 4

## 2017-06-29 ENCOUNTER — Telehealth: Payer: Self-pay

## 2017-06-29 MED ORDER — FLUCONAZOLE 150 MG PO TABS: ORAL_TABLET | ORAL | 1 refills | Status: DC

## 2017-06-29 NOTE — Addendum Note (Signed)
Addended by: Biagio Borg on: 06/29/2017 01:00 PM   Modules accepted: Orders

## 2017-06-29 NOTE — Telephone Encounter (Signed)
Pharmacy requested a refill for:  Diflucan 100mg  tablets Take 1 tablet by mouth daily  However, I do not see that the patient is on an antibiotics or need for this med. Please advise.

## 2017-07-13 ENCOUNTER — Ambulatory Visit (INDEPENDENT_AMBULATORY_CARE_PROVIDER_SITE_OTHER): Payer: BLUE CROSS/BLUE SHIELD | Admitting: Neurology

## 2017-07-13 ENCOUNTER — Encounter: Payer: Self-pay | Admitting: Neurology

## 2017-07-13 VITALS — BP 122/92 | HR 85 | Ht 66.0 in | Wt 324.1 lb

## 2017-07-13 DIAGNOSIS — G43009 Migraine without aura, not intractable, without status migrainosus: Secondary | ICD-10-CM | POA: Diagnosis not present

## 2017-07-13 NOTE — Progress Notes (Signed)
NEUROLOGY FOLLOW UP OFFICE NOTE  Nicole Melendez [id number]  HISTORY OF PRESENT ILLNESS: Nicole Melendez is a 41 year old right-handed woman with diabetes with neuropathy, GERD, PUD, migraine, sarcoid, hyperlipidemia, and HTN who follows up for migraine.  UPDATE:  Intensity:  7/10 Duration:  1 hour with sumatriptan Frequency:  Approximately once a month Frequency of abortive medication: once a month Current NSAIDS:  naprpoxen 552m (for back pain) Current analgesics:  Tylenol  Current triptans:  sumatriptan 1028m(effective) Current anti-emetic:  Zofran 53m23murrent muscle relaxants:  Flexeril (back pain) Current anti-anxiolytic:  alprazolam Current sleep aide:  no Current Antihypertensive medications:  Lasix Current Antidepressant medications:  sertraline 200m33mrrent Anticonvulsant medications:  zonisamide 100mg82mbapentin 300mg 58me times daily Current Vitamins/Herbal/Supplements:  folic acid, D3 Current Antihistamines/Decongestants:  Zyrtec Other therapy:  no Other medication:  methotrexate   Caffeine:  no Alcohol:  no Smoker:  Stopped Diet:  Hydrates, baked chicken, steamed vegetables Exercise:  Going to start exercising again since fracturing foot/ankle Depression/stress:  Istress improved Sleep hygiene:  varies  HISTORY: Onset:  Mid-30s Location:  Back of head, right sided Quality:  pounding Initial Intensity:  8-9/10 Aura:  no Prodrome:  no Associated symptoms:  Nausea, vomiting, osmophobia, right eye blurred vision, sometimes photophobia Initial Duration:  2 days (within one hour with sumatriptan) Initial Frequency:  Varies.  Some months 1-2 days, some months 10 days Triggers/exacerbating factors:  no Relieving factors:  no Activity:  Cannot function 1 to 2 days per month   Past NSAIDS:  ibuprofen Past analgesics:  Percocet, tramadol (for back pain) Past abortive triptans:  no Past muscle relaxants:  tizanidine 53mg Pa45manti-emetic:  Phenergan, Zofran  (effective) Past antihypertensive medications:  no Past antidepressant medications:  no Past anticonvulsant medications:  topiramate (caused nausea and vomiting) Past vitamins/Herbal/Supplements:  no Other past therapies:  meditation  Family history of headache:  mother  PAST MEDICAL HISTORY: Past Medical History:  Diagnosis Date  . Allergic rhinitis, cause unspecified   . Anemia   . Anxiety   . Depression   . Diabetes mellitus type II    steroid related, patient states "im no longer diabetic"  . Essential hypertension 08/08/2015  . GERD (gastroesophageal reflux disease)   . History of blood transfusion   . Hyperlipidemia   . Menorrhagia   . Migraine   . Morbid obesity (HCC)   Cochituatetitis media 06/28/2015  . Peptic ulcer disease   . Positive ANA (antinuclear antibody) 02/14/2012  . Sarcoid    including hand per rheumatology-Dr. BeekmanAmil Amencoidosis of lung (HCC)   Pomeroyhortness of breath    on exertion  . Varicose veins with pain     MEDICATIONS: Current Outpatient Prescriptions on File Prior to Visit  Medication Sig Dispense Refill  . acetaminophen (TYLENOL) 500 MG tablet Take 1,000 mg by mouth every 6 (six) hours as needed for mild pain or moderate pain.    . alendMarland Kitchenonate (FOSAMAX) 70 MG tablet Take 1 tablet (70 mg total) by mouth every 7 (seven) days. 4 tablet 11  . ALPRAZolam (XANAX) 0.5 MG tablet TAKE 1 TABLET BY MOUTH THREE TIMES DAILY AS NEEDED 90 tablet 2  . azelastine (ASTELIN) 0.1 % nasal spray Place 2 sprays into both nostrils 2 (two) times daily. Use in each nostril as directed 30 mL 12  . Blood Glucose Monitoring Suppl (ONE TOUCH ULTRA 2) W/DEVICE KIT Use as directed four times daily. Dx: E11.9 1 each 0  .  budesonide-formoterol (SYMBICORT) 160-4.5 MCG/ACT inhaler Inhale 2 puffs into the lungs 2 (two) times daily. 1 Inhaler 5  . cetirizine (ZYRTEC) 10 MG tablet TAKE 1 TABLET(10 MG) BY MOUTH DAILY 90 tablet 0  . cyclobenzaprine (FLEXERIL) 5 MG tablet Take 1 tablet (5  mg total) by mouth at bedtime as needed for muscle spasms. (Patient taking differently: Take 10 mg by mouth at bedtime as needed for muscle spasms. ) 30 tablet 3  . fluconazole (DIFLUCAN) 150 MG tablet 1 tab by mouth every 3 days 2 tablet 1  . folic acid (FOLVITE) 1 MG tablet TAKE 2 TABLETS BY MOUTH DAILY 180 tablet 3  . furosemide (LASIX) 40 MG tablet 1 tab by mouth every day in the AM and then 1 as needed only for persistent swelling in the PM 60 tablet 11  . gabapentin (NEURONTIN) 300 MG capsule TAKE 1 CAPSULE(300 MG) BY MOUTH THREE TIMES DAILY 90 capsule 5  . glucose blood (ONE TOUCH ULTRA TEST) test strip Use to check blood sugars four times a day Dx E11.9 400 each 11  . HUMALOG 100 UNIT/ML injection INJECT UP TO 12 UNITS INTO THE SKIN THREE TIMES DAILY WITH MEALS WITH SLIDING SCALE AS DIRECTED (Patient taking differently: Inject 2 to 12 units 3 times a day as needed for high blood sugar) 10 mL 0  . Insulin Syringe-Needle U-100 25G X 1" 1 ML MISC Use to administer humalog insulin three times a day Dx E11.9 100 each 5  . levalbuterol (XOPENEX HFA) 45 MCG/ACT inhaler Inhale 1-2 puffs into the lungs every 8 (eight) hours as needed for wheezing or shortness of breath. 1 Inhaler 5  . lidocaine (XYLOCAINE) 2 % solution Use as directed 20 mLs in the mouth or throat as needed for mouth pain. 100 mL 1  . methotrexate (RHEUMATREX) 2.5 MG tablet Take 15 mg by mouth every Tuesday.     . naproxen (NAPROSYN) 500 MG tablet Take 1 tablet (500 mg total) by mouth 2 (two) times daily as needed. (Patient not taking: Reported on 07/13/2017) 60 tablet 5  . nystatin (MYCOSTATIN) 100000 UNIT/ML suspension Swish and swallow 49m four times daily x5-7days (Patient not taking: Reported on 07/13/2017) 140 mL 2  . omeprazole (PRILOSEC) 40 MG capsule Take 1 capsule (40 mg total) by mouth daily. (Patient not taking: Reported on 07/13/2017) 90 capsule 3  . ondansetron (ZOFRAN-ODT) 8 MG disintegrating tablet Take 1 tablet (8 mg  total) by mouth every 8 (eight) hours as needed for nausea or vomiting. 20 tablet 0  . ONE TOUCH ULTRA TEST test strip TEST BLOOD SUGAR FOUR TIMES DAILY 400 each 0  . potassium chloride (K-DUR) 10 MEQ tablet 3 tab by mouth per day only when taking the lasix (Patient taking differently: Take 10-20 mEq by mouth 2 (two) times daily. Take 10 meq in the morning and 272m at night) 90 tablet 11  . predniSONE (DELTASONE) 10 MG tablet Take 4 tabs for 2 days, then 3 tabs for 2 days, 2 tabs for 2 days, then 1 tab for 2 days, then stop. (Patient not taking: Reported on 07/13/2017) 20 tablet 0  . ranitidine (ZANTAC) 150 MG tablet TAKE 1 TABLET(150 MG) BY MOUTH AT BEDTIME (Patient not taking: Reported on 07/13/2017) 30 tablet 0  . rosuvastatin (CRESTOR) 20 MG tablet Take 1 tablet (20 mg total) by mouth daily. 90 tablet 3  . sertraline (ZOLOFT) 100 MG tablet TAKE 2 TABLETS BY MOUTH DAILY 180 tablet 3  . SUMAtriptan (IMITREX)  100 MG tablet Take 1 tablet by mouth at the onset of a headache. May repeat in 2 hours if headache persists or recurs. 10 tablet 2  . telmisartan (MICARDIS) 40 MG tablet TAKE 1 TABLET(40 MG) BY MOUTH DAILY 90 tablet 0  . TURMERIC PO Take 1 tablet by mouth daily.    . vitamin B-12 (CYANOCOBALAMIN) 1000 MCG tablet Take 1,000 mcg by mouth daily.    . Vitamin D, Ergocalciferol, (DRISDOL) 50000 units CAPS capsule TAKE 1 CAPSULE BY MOUTH EVERY 7 DAYS 12 capsule 0  . zonisamide (ZONEGRAN) 100 MG capsule Take 1 capsule (100 mg total) by mouth daily. 90 capsule 0   No current facility-administered medications on file prior to visit.     ALLERGIES: Allergies  Allergen Reactions  . Azithromycin Hives    (z pak) hives  . Bee Venom Anaphylaxis  . Flagyl [Metronidazole] Hives and Shortness Of Breath  . Penicillins Shortness Of Breath and Swelling    Has patient had a PCN reaction causing immediate rash, facial/tongue/throat swelling, SOB or lightheadedness with hypotension: Yes Has patient had a PCN  reaction causing severe rash involving mucus membranes or skin necrosis: No Has patient had a PCN reaction that required hospitalization No Has patient had a PCN reaction occurring within the last 10 years: No If all of the above answers are "NO", then may proceed with Cephalosporin use.  REACTION: swelling and difficulty breathing  . Shellfish Allergy Anaphylaxis  . Klonopin [Clonazepam] Other (See Comments)    Memory difficulty    FAMILY HISTORY: Family History  Problem Relation Age of Onset  . Allergies Mother   . Heart attack Mother   . Diabetes Mother   . Diabetes Father   . Heart disease Father   . Rheum arthritis Father   . Stroke Father   . Hypertension Unknown   . Ovarian cancer Maternal Aunt   . Lung cancer Maternal Aunt   . Breast cancer Maternal Aunt   . Bone cancer Maternal Aunt   . Stroke Maternal Uncle   . Other Neg Hx     SOCIAL HISTORY: Social History   Social History  . Marital status: Single    Spouse name: N/A  . Number of children: 0  . Years of education: N/A   Occupational History  . personal shopper    Social History Main Topics  . Smoking status: Former Smoker    Packs/day: 1.00    Years: 20.00    Types: Cigarettes    Quit date: 10/25/2012  . Smokeless tobacco: Never Used     Comment: QUIT 04/2010 AND STARTED BACK 2014 X 3 MONTHS. less than 1 ppd.  started at age 43.    Marland Kitchen Alcohol use No  . Drug use: No  . Sexual activity: Yes    Birth control/ protection: None   Other Topics Concern  . Not on file   Social History Narrative   Pt lives with [name]- also pt of [facility]    REVIEW OF SYSTEMS: Constitutional: No fevers, chills, or sweats, no generalized fatigue, change in appetite Eyes: No visual changes, double vision, eye pain Ear, nose and throat: No hearing loss, ear pain, nasal congestion, sore throat Cardiovascular: No chest pain, palpitations Respiratory:  No shortness of breath at rest or with exertion,  wheezes GastrointestinaI: No nausea, vomiting, diarrhea, abdominal pain, fecal incontinence Genitourinary:  No dysuria, urinary retention or frequency Musculoskeletal:  No neck pain, back pain Integumentary: No rash, pruritus, skin lesions Neurological:  as above Psychiatric: No depression, insomnia, anxiety Endocrine: No palpitations, fatigue, diaphoresis, mood swings, change in appetite, change in weight, increased thirst Hematologic/Lymphatic:  No purpura, petechiae. Allergic/Immunologic: no itchy/runny eyes, nasal congestion, recent allergic reactions, rashes  PHYSICAL EXAM: Vitals:   07/13/17 0732  BP: (!) 122/92  Pulse: 85  SpO2: 96%   General: No acute distress.  Patient appears well-groomed.  Morbidly obese body habitus. Head:  Normocephalic/atraumatic Eyes:  Fundi examined but not visualized Neck: supple, no paraspinal tenderness, full range of motion Heart:  Regular rate and rhythm Lungs:  Clear to auscultation bilaterally Back: No paraspinal tenderness Neurological Exam: alert and oriented to person, place, and time. Attention span and concentration intact, recent and remote memory intact, fund of knowledge intact.  Speech fluent and not dysarthric, language intact.  CN II-XII intact. Bulk and tone normal, muscle strength 5/5 throughout.  Sensation to light touch intact.  Deep tendon reflexes 2+ throughout.  Finger to nose and heel to shin testing intact.  Gait normal, Romberg negative.  IMPRESSION: Migraine without aura controlled Morbid obesity  PLAN: 1.  zonisamide 174m daily 2.  Sumatriptan 1029mfor abortive therapy 3.  Continue lifestyle modification for weight loss 4.  Follow up in 6 months.  AdMetta ClinesDO  CC:  JaCathlean CowerMD

## 2017-07-13 NOTE — Patient Instructions (Signed)
I am glad you are doing well Continue zonisamide 100mg  daily  Continue sumatriptan as needed/directed Continue diet, exercise and weight loss Follow up in 6 months.

## 2017-07-14 ENCOUNTER — Other Ambulatory Visit: Payer: Self-pay | Admitting: Internal Medicine

## 2017-07-14 MED ORDER — ALPRAZOLAM 0.5 MG PO TABS: 0.5000 mg | ORAL_TABLET | Freq: Three times a day (TID) | ORAL | 2 refills | Status: DC | PRN

## 2017-07-14 NOTE — Telephone Encounter (Signed)
Faxed

## 2017-07-14 NOTE — Telephone Encounter (Signed)
Done hardcopy to Shirron  

## 2017-07-15 ENCOUNTER — Other Ambulatory Visit: Payer: Self-pay | Admitting: Internal Medicine

## 2017-07-19 ENCOUNTER — Encounter: Payer: Self-pay | Admitting: Internal Medicine

## 2017-07-24 ENCOUNTER — Other Ambulatory Visit: Payer: Self-pay | Admitting: Internal Medicine

## 2017-07-29 ENCOUNTER — Encounter: Payer: Self-pay | Admitting: Internal Medicine

## 2017-08-03 NOTE — Telephone Encounter (Signed)
Pt called asking if we have received the leave of absence forms so she can get paid.  Please advise

## 2017-08-10 ENCOUNTER — Encounter: Payer: Self-pay | Admitting: Family Medicine

## 2017-08-10 MED ORDER — DICLOFENAC SODIUM 2 % TD SOLN: TRANSDERMAL | 3 refills | Status: DC

## 2017-08-23 ENCOUNTER — Other Ambulatory Visit: Payer: Self-pay | Admitting: Internal Medicine

## 2017-08-23 ENCOUNTER — Other Ambulatory Visit: Payer: Self-pay | Admitting: Emergency Medicine

## 2017-09-18 ENCOUNTER — Inpatient Hospital Stay (HOSPITAL_COMMUNITY)
Admission: EM | Admit: 2017-09-18 | Discharge: 2017-09-22 | DRG: 193 | Disposition: A | Discharge status: Home / Self Care | Payer: BLUE CROSS/BLUE SHIELD | Attending: Internal Medicine | Admitting: Internal Medicine

## 2017-09-18 ENCOUNTER — Encounter (HOSPITAL_COMMUNITY): Payer: Self-pay

## 2017-09-18 ENCOUNTER — Emergency Department (HOSPITAL_COMMUNITY): Payer: BLUE CROSS/BLUE SHIELD

## 2017-09-18 ENCOUNTER — Other Ambulatory Visit: Payer: Self-pay

## 2017-09-18 DIAGNOSIS — M069 Rheumatoid arthritis, unspecified: Secondary | ICD-10-CM | POA: Diagnosis not present

## 2017-09-18 DIAGNOSIS — F411 Generalized anxiety disorder: Secondary | ICD-10-CM | POA: Diagnosis not present

## 2017-09-18 DIAGNOSIS — E785 Hyperlipidemia, unspecified: Secondary | ICD-10-CM | POA: Diagnosis not present

## 2017-09-18 DIAGNOSIS — Z881 Allergy status to other antibiotic agents status: Secondary | ICD-10-CM | POA: Diagnosis not present

## 2017-09-18 DIAGNOSIS — D869 Sarcoidosis, unspecified: Secondary | ICD-10-CM | POA: Diagnosis present

## 2017-09-18 DIAGNOSIS — Z880 Allergy status to penicillin: Secondary | ICD-10-CM

## 2017-09-18 DIAGNOSIS — F419 Anxiety disorder, unspecified: Secondary | ICD-10-CM | POA: Diagnosis present

## 2017-09-18 DIAGNOSIS — F329 Major depressive disorder, single episode, unspecified: Secondary | ICD-10-CM | POA: Diagnosis not present

## 2017-09-18 DIAGNOSIS — I10 Essential (primary) hypertension: Secondary | ICD-10-CM | POA: Diagnosis not present

## 2017-09-18 DIAGNOSIS — K219 Gastro-esophageal reflux disease without esophagitis: Secondary | ICD-10-CM | POA: Diagnosis not present

## 2017-09-18 DIAGNOSIS — T380X5A Adverse effect of glucocorticoids and synthetic analogues, initial encounter: Secondary | ICD-10-CM | POA: Diagnosis not present

## 2017-09-18 DIAGNOSIS — J181 Lobar pneumonia, unspecified organism: Secondary | ICD-10-CM | POA: Diagnosis not present

## 2017-09-18 DIAGNOSIS — I5032 Chronic diastolic (congestive) heart failure: Secondary | ICD-10-CM | POA: Diagnosis present

## 2017-09-18 DIAGNOSIS — Z6841 Body Mass Index (BMI) 40.0 and over, adult: Secondary | ICD-10-CM

## 2017-09-18 DIAGNOSIS — E1140 Type 2 diabetes mellitus with diabetic neuropathy, unspecified: Secondary | ICD-10-CM | POA: Diagnosis present

## 2017-09-18 DIAGNOSIS — Z794 Long term (current) use of insulin: Secondary | ICD-10-CM

## 2017-09-18 DIAGNOSIS — D860 Sarcoidosis of lung: Secondary | ICD-10-CM | POA: Diagnosis present

## 2017-09-18 DIAGNOSIS — R05 Cough: Secondary | ICD-10-CM | POA: Diagnosis not present

## 2017-09-18 DIAGNOSIS — I110 Hypertensive heart disease with heart failure: Secondary | ICD-10-CM | POA: Diagnosis not present

## 2017-09-18 DIAGNOSIS — Z91013 Allergy to seafood: Secondary | ICD-10-CM | POA: Diagnosis not present

## 2017-09-18 DIAGNOSIS — I5033 Acute on chronic diastolic (congestive) heart failure: Secondary | ICD-10-CM | POA: Diagnosis not present

## 2017-09-18 DIAGNOSIS — B370 Candidal stomatitis: Secondary | ICD-10-CM | POA: Diagnosis not present

## 2017-09-18 DIAGNOSIS — J189 Pneumonia, unspecified organism: Secondary | ICD-10-CM | POA: Diagnosis not present

## 2017-09-18 DIAGNOSIS — Z888 Allergy status to other drugs, medicaments and biological substances status: Secondary | ICD-10-CM | POA: Diagnosis not present

## 2017-09-18 DIAGNOSIS — F32A Depression, unspecified: Secondary | ICD-10-CM | POA: Diagnosis present

## 2017-09-18 DIAGNOSIS — Z91030 Bee allergy status: Secondary | ICD-10-CM

## 2017-09-18 DIAGNOSIS — R0902 Hypoxemia: Secondary | ICD-10-CM | POA: Diagnosis not present

## 2017-09-18 LAB — BASIC METABOLIC PANEL
ANION GAP: 10 (ref 5–15)
BUN: 8 mg/dL (ref 6–20)
CALCIUM: 9.6 mg/dL (ref 8.9–10.3)
CO2: 24 mmol/L (ref 22–32)
CREATININE: 0.82 mg/dL (ref 0.44–1.00)
Chloride: 107 mmol/L (ref 101–111)
Glucose, Bld: 135 mg/dL — ABNORMAL HIGH (ref 65–99)
Potassium: 3.7 mmol/L (ref 3.5–5.1)
SODIUM: 141 mmol/L (ref 135–145)

## 2017-09-18 LAB — CBC WITH DIFFERENTIAL/PLATELET
BASOS ABS: 0 10*3/uL (ref 0.0–0.1)
BASOS PCT: 0 %
EOS ABS: 0.1 10*3/uL (ref 0.0–0.7)
EOS PCT: 1 %
HEMATOCRIT: 39.8 % (ref 36.0–46.0)
Hemoglobin: 13.5 g/dL (ref 12.0–15.0)
Lymphocytes Relative: 10 %
Lymphs Abs: 1.2 10*3/uL (ref 0.7–4.0)
MCH: 29.3 pg (ref 26.0–34.0)
MCHC: 33.9 g/dL (ref 30.0–36.0)
MCV: 86.3 fL (ref 78.0–100.0)
MONO ABS: 0.3 10*3/uL (ref 0.1–1.0)
MONOS PCT: 3 %
NEUTROS ABS: 10.3 10*3/uL — AB (ref 1.7–7.7)
Neutrophils Relative %: 86 %
PLATELETS: 299 10*3/uL (ref 150–400)
RBC: 4.61 MIL/uL (ref 3.87–5.11)
RDW: 13.9 % (ref 11.5–15.5)
WBC: 12 10*3/uL — ABNORMAL HIGH (ref 4.0–10.5)

## 2017-09-18 LAB — I-STAT CG4 LACTIC ACID, ED: LACTIC ACID, VENOUS: 1.18 mmol/L (ref 0.5–1.9)

## 2017-09-18 LAB — PROCALCITONIN: Procalcitonin: <0.1 ng/mL

## 2017-09-18 MED ORDER — INSULIN ASPART 100 UNIT/ML ~~LOC~~ SOLN
0.0000 [IU] | Freq: Three times a day (TID) | SUBCUTANEOUS | Status: DC
  Administered 2017-09-19 – 2017-09-20 (×4): 2 [IU] via SUBCUTANEOUS
  Administered 2017-09-20 (×2): 5 [IU] via SUBCUTANEOUS
  Administered 2017-09-21: 3 [IU] via SUBCUTANEOUS
  Administered 2017-09-21: 2 [IU] via SUBCUTANEOUS
  Administered 2017-09-21 – 2017-09-22 (×3): 3 [IU] via SUBCUTANEOUS

## 2017-09-18 MED ORDER — FUROSEMIDE 40 MG PO TABS
40.0000 mg | ORAL_TABLET | Freq: Every day | ORAL | Status: DC
  Administered 2017-09-19 – 2017-09-21 (×3): 40 mg via ORAL
  Filled 2017-09-18 (×3): qty 1

## 2017-09-18 MED ORDER — GUAIFENESIN-DM 100-10 MG/5ML PO SYRP
5.0000 mL | ORAL_SOLUTION | ORAL | Status: DC | PRN
  Administered 2017-09-18 – 2017-09-20 (×6): 5 mL via ORAL
  Administered 2017-09-20: 10 mL via ORAL
  Administered 2017-09-20 – 2017-09-22 (×4): 5 mL via ORAL
  Filled 2017-09-18 (×11): qty 10

## 2017-09-18 MED ORDER — INSULIN ASPART 100 UNIT/ML ~~LOC~~ SOLN
0.0000 [IU] | Freq: Every day | SUBCUTANEOUS | Status: DC
  Administered 2017-09-20: 4 [IU] via SUBCUTANEOUS
  Administered 2017-09-21: 2 [IU] via SUBCUTANEOUS

## 2017-09-18 MED ORDER — SODIUM CHLORIDE 0.9% FLUSH
3.0000 mL | Freq: Two times a day (BID) | INTRAVENOUS | Status: DC
  Administered 2017-09-18 – 2017-09-22 (×8): 3 mL via INTRAVENOUS

## 2017-09-18 MED ORDER — ROSUVASTATIN CALCIUM 20 MG PO TABS
20.0000 mg | ORAL_TABLET | Freq: Every day | ORAL | Status: DC
  Administered 2017-09-19 – 2017-09-21 (×3): 20 mg via ORAL
  Filled 2017-09-18 (×3): qty 1

## 2017-09-18 MED ORDER — METHYLPREDNISOLONE SODIUM SUCC 125 MG IJ SOLR
125.0000 mg | Freq: Once | INTRAMUSCULAR | Status: AC
  Administered 2017-09-18: 125 mg via INTRAVENOUS
  Filled 2017-09-18: qty 2

## 2017-09-18 MED ORDER — IPRATROPIUM-ALBUTEROL 0.5-2.5 (3) MG/3ML IN SOLN
3.0000 mL | Freq: Once | RESPIRATORY_TRACT | Status: AC
  Administered 2017-09-18: 3 mL via RESPIRATORY_TRACT
  Filled 2017-09-18: qty 18

## 2017-09-18 MED ORDER — LEVOFLOXACIN IN D5W 750 MG/150ML IV SOLN
750.0000 mg | Freq: Once | INTRAVENOUS | Status: AC
  Administered 2017-09-18: 750 mg via INTRAVENOUS
  Filled 2017-09-18: qty 150

## 2017-09-18 MED ORDER — IRBESARTAN 150 MG PO TABS
150.0000 mg | ORAL_TABLET | Freq: Every day | ORAL | Status: DC
  Administered 2017-09-19 – 2017-09-22 (×4): 150 mg via ORAL
  Filled 2017-09-18 (×5): qty 1

## 2017-09-18 MED ORDER — OXYCODONE-ACETAMINOPHEN 5-325 MG PO TABS: 1.0000 | ORAL_TABLET | Freq: Three times a day (TID) | ORAL | Status: DC | PRN

## 2017-09-18 MED ORDER — LEVOFLOXACIN IN D5W 750 MG/150ML IV SOLN
750.0000 mg | Freq: Every day | INTRAVENOUS | Status: DC
  Administered 2017-09-19: 750 mg via INTRAVENOUS
  Filled 2017-09-18: qty 150

## 2017-09-18 MED ORDER — SODIUM CHLORIDE 0.9% FLUSH: 3.0000 mL | INTRAVENOUS | Status: DC | PRN

## 2017-09-18 MED ORDER — ZONISAMIDE 100 MG PO CAPS
100.0000 mg | ORAL_CAPSULE | Freq: Every day | ORAL | Status: DC
  Administered 2017-09-19 – 2017-09-22 (×4): 100 mg via ORAL
  Filled 2017-09-18 (×4): qty 1

## 2017-09-18 MED ORDER — SODIUM CHLORIDE 0.9 % IV SOLN: 250.0000 mL | INTRAVENOUS | Status: DC | PRN

## 2017-09-18 MED ORDER — ENOXAPARIN SODIUM 40 MG/0.4ML ~~LOC~~ SOLN
40.0000 mg | Freq: Every day | SUBCUTANEOUS | Status: DC
  Administered 2017-09-18: 40 mg via SUBCUTANEOUS
  Filled 2017-09-18: qty 0.4

## 2017-09-18 MED ORDER — VITAMIN D3 25 MCG (1000 UNIT) PO TABS
1000.0000 [IU] | ORAL_TABLET | Freq: Every day | ORAL | Status: DC
  Administered 2017-09-19 – 2017-09-22 (×4): 1000 [IU] via ORAL
  Filled 2017-09-18 (×5): qty 1

## 2017-09-18 MED ORDER — ALBUTEROL SULFATE (2.5 MG/3ML) 0.083% IN NEBU
5.0000 mg | INHALATION_SOLUTION | Freq: Once | RESPIRATORY_TRACT | Status: AC
  Administered 2017-09-18: 5 mg via RESPIRATORY_TRACT
  Filled 2017-09-18: qty 6

## 2017-09-18 MED ORDER — POTASSIUM CHLORIDE CRYS ER 10 MEQ PO TBCR
10.0000 meq | EXTENDED_RELEASE_TABLET | Freq: Every day | ORAL | Status: DC
  Administered 2017-09-19 – 2017-09-22 (×4): 10 meq via ORAL
  Filled 2017-09-18 (×4): qty 1

## 2017-09-18 MED ORDER — SODIUM CHLORIDE 0.9 % IV BOLUS (SEPSIS)
500.0000 mL | Freq: Once | INTRAVENOUS | Status: AC
  Administered 2017-09-18: 500 mL via INTRAVENOUS

## 2017-09-18 MED ORDER — ACETAMINOPHEN 325 MG PO TABS
650.0000 mg | ORAL_TABLET | Freq: Four times a day (QID) | ORAL | Status: DC | PRN
  Administered 2017-09-18 – 2017-09-20 (×4): 650 mg via ORAL
  Filled 2017-09-18 (×4): qty 2

## 2017-09-18 MED ORDER — CYCLOBENZAPRINE HCL 10 MG PO TABS: 10.0000 mg | ORAL_TABLET | Freq: Every evening | ORAL | Status: DC | PRN

## 2017-09-18 MED ORDER — SERTRALINE HCL 100 MG PO TABS
200.0000 mg | ORAL_TABLET | Freq: Every day | ORAL | Status: DC
  Administered 2017-09-19 – 2017-09-22 (×4): 200 mg via ORAL
  Filled 2017-09-18 (×4): qty 2

## 2017-09-18 MED ORDER — ALBUTEROL SULFATE (2.5 MG/3ML) 0.083% IN NEBU
2.5000 mg | INHALATION_SOLUTION | Freq: Once | RESPIRATORY_TRACT | Status: AC
  Administered 2017-09-18: 2.5 mg via RESPIRATORY_TRACT
  Filled 2017-09-18: qty 3

## 2017-09-18 MED ORDER — MOMETASONE FURO-FORMOTEROL FUM 200-5 MCG/ACT IN AERO
2.0000 | INHALATION_SPRAY | Freq: Two times a day (BID) | RESPIRATORY_TRACT | Status: DC
  Administered 2017-09-19 – 2017-09-21 (×4): 2 via RESPIRATORY_TRACT
  Filled 2017-09-18: qty 8.8

## 2017-09-18 MED ORDER — ALPRAZOLAM 0.5 MG PO TABS
0.5000 mg | ORAL_TABLET | Freq: Three times a day (TID) | ORAL | Status: DC | PRN
  Administered 2017-09-19 – 2017-09-22 (×5): 0.5 mg via ORAL
  Filled 2017-09-18 (×6): qty 1

## 2017-09-18 MED ORDER — SODIUM CHLORIDE 0.9 % IV BOLUS (SEPSIS)
1000.0000 mL | Freq: Once | INTRAVENOUS | Status: AC
  Administered 2017-09-18: 1000 mL via INTRAVENOUS

## 2017-09-18 MED ORDER — METHYLPREDNISOLONE SODIUM SUCC 40 MG IJ SOLR
40.0000 mg | Freq: Three times a day (TID) | INTRAMUSCULAR | Status: DC
  Administered 2017-09-19 – 2017-09-21 (×8): 40 mg via INTRAVENOUS
  Filled 2017-09-18 (×8): qty 1

## 2017-09-18 MED ORDER — VITAMIN B-12 1000 MCG PO TABS
1000.0000 ug | ORAL_TABLET | Freq: Every day | ORAL | Status: DC
  Administered 2017-09-19 – 2017-09-22 (×4): 1000 ug via ORAL
  Filled 2017-09-18 (×4): qty 1

## 2017-09-18 MED ORDER — LORATADINE 10 MG PO TABS
10.0000 mg | ORAL_TABLET | Freq: Every day | ORAL | Status: DC
  Administered 2017-09-19 – 2017-09-22 (×4): 10 mg via ORAL
  Filled 2017-09-18 (×4): qty 1

## 2017-09-18 MED ORDER — GABAPENTIN 300 MG PO CAPS
300.0000 mg | ORAL_CAPSULE | Freq: Three times a day (TID) | ORAL | Status: DC
  Administered 2017-09-18 – 2017-09-22 (×12): 300 mg via ORAL
  Filled 2017-09-18 (×12): qty 1

## 2017-09-18 MED ORDER — OXYCODONE HCL 5 MG PO TABS
5.0000 mg | ORAL_TABLET | Freq: Three times a day (TID) | ORAL | Status: DC | PRN
  Filled 2017-09-18: qty 1

## 2017-09-18 MED ORDER — DICLOFENAC SODIUM 1 % TD GEL
1.0000 "application " | Freq: Two times a day (BID) | TRANSDERMAL | Status: DC | PRN
  Filled 2017-09-18: qty 100

## 2017-09-18 MED ORDER — IPRATROPIUM-ALBUTEROL 0.5-2.5 (3) MG/3ML IN SOLN: 3.0000 mL | RESPIRATORY_TRACT | Status: DC | PRN

## 2017-09-18 MED ORDER — FOLIC ACID 1 MG PO TABS
2.0000 mg | ORAL_TABLET | Freq: Every day | ORAL | Status: DC
  Administered 2017-09-19 – 2017-09-22 (×4): 2 mg via ORAL
  Filled 2017-09-18 (×4): qty 2

## 2017-09-18 MED ORDER — OXYCODONE-ACETAMINOPHEN 10-325 MG PO TABS: 1.0000 | ORAL_TABLET | Freq: Three times a day (TID) | ORAL | Status: DC | PRN

## 2017-09-18 MED ORDER — POTASSIUM CHLORIDE CRYS ER 20 MEQ PO TBCR
20.0000 meq | EXTENDED_RELEASE_TABLET | Freq: Every day | ORAL | Status: DC
  Administered 2017-09-18 – 2017-09-21 (×4): 20 meq via ORAL
  Filled 2017-09-18 (×4): qty 1

## 2017-09-18 NOTE — ED Notes (Signed)
ED TO INPATIENT HANDOFF REPORT  Name/Age/Gender Nicole Melendez 41 y.o. female  Code Status    Code Status Orders  (From admission, onward)        Start     Ordered   09/18/17 2108  Full code  Continuous     09/18/17 2109    Code Status History    Date Active Date Inactive Code Status Order ID Comments User Context   07/20/2012 13:20 07/21/2012 13:31 Full Code [id number]  Vertell Limber, RN Inpatient      Home/SNF/Other Home  Chief Complaint Shortness of Breath;Generalized body Pain  Level of Care/Admitting Diagnosis ED Disposition    ED Disposition Condition [place]: [facility] [[id number]]  Level of Care: Med-Surg [16]  Diagnosis: CAP (community acquired pneumonia) [440347]  Admitting Physician: Vianne Bulls [[id number]]  Attending Physician: Vianne Bulls [[id number]]  Estimated length of stay: past midnight tomorrow  Certification:: I certify this patient will need inpatient services for at least 2 midnights  PT Class (Do Not Modify): Inpatient [101]  PT Acc Code (Do Not Modify): Private [1]       Medical History Past Medical History:  Diagnosis Date  . Allergic rhinitis, cause unspecified   . Anemia   . Anxiety   . Depression   . Diabetes mellitus type II    steroid related, patient states "im no longer diabetic"  . Essential hypertension 08/08/2015  . GERD (gastroesophageal reflux disease)   . History of blood transfusion   . Hyperlipidemia   . Menorrhagia   . Migraine   . Morbid obesity ([place])   . Otitis media 06/28/2015  . Peptic ulcer disease   . Positive ANA (antinuclear antibody) 02/14/2012  . Sarcoid    including hand per rheumatology-Dr. Amil Amen  . Sarcoidosis of lung ([place])   . Shortness of breath    on exertion  . Varicose veins with pain     Allergies Allergies  Allergen Reactions  . Azithromycin Hives    (z pak) hives  . Bee Venom Anaphylaxis  . Flagyl [Metronidazole] Hives and  Shortness Of Breath  . Penicillins Shortness Of Breath and Swelling    Has patient had a PCN reaction causing immediate rash, facial/tongue/throat swelling, SOB or lightheadedness with hypotension: Yes Has patient had a PCN reaction causing severe rash involving mucus membranes or skin necrosis: No Has patient had a PCN reaction that required hospitalization No Has patient had a PCN reaction occurring within the last 10 years: No If all of the above answers are "NO", then may proceed with Cephalosporin use.  REACTION: swelling and difficulty breathing  . Shellfish Allergy Anaphylaxis  . Klonopin [Clonazepam] Other (See Comments)    Memory difficulty    IV Location/Drains/Wounds Patient Lines/Drains/Airways Status   Active Line/Drains/Airways    Name:   Placement date:   Placement time:   Site:   Days:   Peripheral IV 09/18/17 Right Hand   09/18/17    1851    Hand   less than 1          Labs/Imaging Results for orders placed or performed during the hospital encounter of 09/18/17 (from the past 48 hour(s))  CBC with Differential     Status: Abnormal   Collection Time: 09/18/17  6:47 PM  Result Value Ref Range   WBC 12.0 (H) 4.0 - 10.5 K/uL   RBC 4.61 3.87 - 5.11 MIL/uL   Hemoglobin 13.5 12.0 - 15.0  g/dL   HCT 39.8 36.0 - 46.0 %   MCV 86.3 78.0 - 100.0 fL   MCH 29.3 26.0 - 34.0 pg   MCHC 33.9 30.0 - 36.0 g/dL   RDW 13.9 11.5 - 15.5 %   Platelets 299 150 - 400 K/uL   Neutrophils Relative % 86 %   Neutro Abs 10.3 (H) 1.7 - 7.7 K/uL   Lymphocytes Relative 10 %   Lymphs Abs 1.2 0.7 - 4.0 K/uL   Monocytes Relative 3 %   Monocytes Absolute 0.3 0.1 - 1.0 K/uL   Eosinophils Relative 1 %   Eosinophils Absolute 0.1 0.0 - 0.7 K/uL   Basophils Relative 0 %   Basophils Absolute 0.0 0.0 - 0.1 K/uL  Basic metabolic panel     Status: Abnormal   Collection Time: 09/18/17  6:47 PM  Result Value Ref Range   Sodium 141 135 - 145 mmol/L   Potassium 3.7 3.5 - 5.1 mmol/L   Chloride 107 101  - 111 mmol/L   CO2 24 22 - 32 mmol/L   Glucose, Bld 135 (H) 65 - 99 mg/dL   BUN 8 6 - 20 mg/dL   Creatinine, Ser 0.82 0.44 - 1.00 mg/dL   Calcium 9.6 8.9 - 10.3 mg/dL   GFR calc non Af Amer >60 >60 mL/min   GFR calc Af Amer >60 >60 mL/min    Comment: (NOTE) The eGFR has been calculated using the CKD EPI equation. This calculation has not been validated in all clinical situations. eGFR's persistently <60 mL/min signify possible Chronic Kidney Disease.    Anion gap 10 5 - 15  I-Stat CG4 Lactic Acid, ED     Status: None   Collection Time: 09/18/17  8:25 PM  Result Value Ref Range   Lactic Acid, Venous 1.18 0.5 - 1.9 mmol/L   Dg Chest 2 View  Result Date: 09/18/2017 CLINICAL DATA:  41 year old female with cough, congestion, shortness of breath. History of sarcoidosis. EXAM: CHEST  2 VIEW COMPARISON:  11/21/2015 FINDINGS: The heart size and mediastinal contours are within normal limits. There is prominence of the right hilum, similar to examination. Vague right basilar opacities suggesting atelectasis versus early infiltrate. The left lung is clear. No sizeable effusions or pneumothorax. The visualized skeletal structures are unremarkable. IMPRESSION: Right basilar atelectasis versus early infiltrate. Follow-up to resolution recommended. Electronically Signed   By: Kristopher Oppenheim M.D.   On: 09/18/2017 18:08    Pending Labs Unresulted Labs (From admission, onward)   Start     Ordered   09/25/17 0500  Creatinine, serum  (enoxaparin (LOVENOX)    CrCl >/= 30 ml/min)  Weekly,   R    Comments:  while on enoxaparin therapy    09/18/17 2109   09/19/17 0500  HIV antibody  Tomorrow morning,   R     09/18/17 2109   09/19/17 7026  Basic metabolic panel  Tomorrow morning,   R     09/18/17 2109   09/19/17 0500  CBC WITH DIFFERENTIAL  Tomorrow morning,   R     09/18/17 2109   09/19/17 0500  Procalcitonin  Daily,   R     09/18/17 2109   09/18/17 2110  Procalcitonin - Baseline  Add-on,   STAT      09/18/17 2109   09/18/17 2109  Respiratory Panel by PCR  (Respiratory virus panel)  Once,   R    Question:  Patient immune status  Answer:  Immunocompromised   09/18/17  2109   09/18/17 2107  Culture, sputum-assessment  Once,   R    Question:  Patient immune status  Answer:  Immunocompromised   09/18/17 2109   09/18/17 2107  Gram stain  Once,   R    Question:  Patient immune status  Answer:  Immunocompromised   09/18/17 2109   09/18/17 2107  Strep pneumoniae urinary antigen  Once,   R     09/18/17 2109   09/18/17 2003  Culture, blood (routine x 2)  BLOOD CULTURE X 2,   STAT     09/18/17 2002      Vitals/Pain Today's Vitals   09/18/17 1831 09/18/17 1845 09/18/17 1900 09/18/17 2013  BP:   (!) 133/57 136/73  Pulse: (!) 112 (!) 109 (!) 113 99  Resp:    (!) 24  Temp:    99.9 F (37.7 C)  TempSrc:    Oral  SpO2:  100% 98% 98%  PainSc:        Isolation Precautions Droplet precaution  Medications Medications  levofloxacin (LEVAQUIN) IVPB 750 mg (750 mg Intravenous New Bag/Given 09/18/17 2114)  ALPRAZolam (XANAX) tablet 0.5 mg (not administered)  mometasone-formoterol (DULERA) 200-5 MCG/ACT inhaler 2 puff (not administered)  loratadine (CLARITIN) tablet 10 mg (not administered)  cholecalciferol (VITAMIN D) tablet 1,000 Units (not administered)  cyclobenzaprine (FLEXERIL) tablet 10 mg (not administered)  Diclofenac Sodium 2 % SOLN 1 application (not administered)  folic acid (FOLVITE) tablet 2 mg (not administered)  furosemide (LASIX) tablet 40 mg (not administered)  gabapentin (NEURONTIN) capsule 300 mg (not administered)  potassium chloride SA (K-DUR,KLOR-CON) CR tablet 20 mEq (not administered)  rosuvastatin (CRESTOR) tablet 20 mg (not administered)  sertraline (ZOLOFT) tablet 200 mg (not administered)  irbesartan (AVAPRO) tablet 150 mg (not administered)  vitamin B-12 (CYANOCOBALAMIN) tablet 1,000 mcg (not administered)  zonisamide (ZONEGRAN) capsule 100 mg (not  administered)  enoxaparin (LOVENOX) injection 40 mg (not administered)  sodium chloride flush (NS) 0.9 % injection 3 mL (not administered)  sodium chloride flush (NS) 0.9 % injection 3 mL (not administered)  0.9 %  sodium chloride infusion (not administered)  levofloxacin (LEVAQUIN) IVPB 750 mg (not administered)  insulin aspart (novoLOG) injection 0-9 Units (not administered)  insulin aspart (novoLOG) injection 0-5 Units (not administered)  ipratropium-albuterol (DUONEB) 0.5-2.5 (3) MG/3ML nebulizer solution 3 mL (not administered)  methylPREDNISolone sodium succinate (SOLU-MEDROL) 125 mg/2 mL injection 125 mg (not administered)  methylPREDNISolone sodium succinate (SOLU-MEDROL) 40 mg/mL injection 40 mg (not administered)  oxyCODONE-acetaminophen (PERCOCET/ROXICET) 5-325 MG per tablet 1 tablet (not administered)    And  oxyCODONE (Oxy IR/ROXICODONE) immediate release tablet 5 mg (not administered)  potassium chloride (K-DUR,KLOR-CON) CR tablet 10 mEq (not administered)  albuterol (PROVENTIL) (2.5 MG/3ML) 0.083% nebulizer solution 5 mg (5 mg Nebulization Given 09/18/17 1736)  ipratropium-albuterol (DUONEB) 0.5-2.5 (3) MG/3ML nebulizer solution 3 mL (3 mLs Nebulization Given 09/18/17 1830)  sodium chloride 0.9 % bolus 1,000 mL (1,000 mLs Intravenous New Bag/Given 09/18/17 1905)  albuterol (PROVENTIL) (2.5 MG/3ML) 0.083% nebulizer solution 2.5 mg (2.5 mg Nebulization Given 09/18/17 1918)    Mobility walks

## 2017-09-18 NOTE — H&P (Signed)
History and Physical    Nicole Melendez:[id number] DOB: Jul 30, 1976 DOA: 09/18/2017  PCP: Biagio Borg, MD   Patient coming from: Home  Chief Complaint: SOB, productive cough, generalized aches, fevers  HPI: Nicole Melendez is a 41 y.o. female with medical history significant for for depression, anxiety, type 2 diabetes mellitus, hypertension,  obesity, and pulmonary sarcoidosis, now presenting to the emergency department with 2 days of fevers, aches, dyspnea, and cough productive of thick green sputum.  Patient reports that she had been in her usual state of health until approximately 2 days ago when she developed fevers up to 102 degrees F with generalized body aches, dyspnea, and productive cough.  Symptoms have progressively worsened despite her use of inhalers and antipyretics at home.  She now reports dyspnea at rest, prompting her presentation to the ED. No rhinorrhea or sore throat, and no sick contacts.   ED Course: Upon arrival to the ED, patient is found to have a temp of 37.7 degree C, saturating adequately on room air, but the tachypnea, tachycardic, and with stable blood pressure.  Chest x-ray is notable for right basilar opacity, atelectasis versus infiltrate.  Chemistry panel is unremarkable and CBC is notable for leukocytosis to 12,000.  Lactic acid is reassuringly normal.  Patient was given a liter of normal saline in the ED, treated with DuoNeb and 2 albuterol treatments.  Blood cultures were collected and she was given a dose of IV Levaquin.  She remained hemodynamically stable, but continues to be dyspneic at rest and will be admitted to the medical-surgical unit for ongoing evaluation and management of community-acquired pneumonia.  Review of Systems:  All other systems reviewed and apart from HPI, are negative.  Past Medical History:  Diagnosis Date  . Allergic rhinitis, cause unspecified   . Anemia   . Anxiety   . Depression   . Diabetes mellitus type II    steroid related, patient states "im no longer diabetic"  . Essential hypertension 08/08/2015  . GERD (gastroesophageal reflux disease)   . History of blood transfusion   . Hyperlipidemia   . Menorrhagia   . Migraine   . Morbid obesity ([place])   . Otitis media 06/28/2015  . Peptic ulcer disease   . Positive ANA (antinuclear antibody) 02/14/2012  . Sarcoid    including hand per rheumatology-Dr. Amil Amen  . Sarcoidosis of lung ([place])   . Shortness of breath    on exertion  . Varicose veins with pain     Past Surgical History:  Procedure Laterality Date  . ABDOMINAL HYSTERECTOMY    . COLONOSCOPY WITH PROPOFOL N/A 10/19/2016   Procedure: COLONOSCOPY WITH PROPOFOL;  Surgeon: Mauri Pole, MD;  Location: [facility];  Service: Endoscopy;  Laterality: N/A;  . ESOPHAGOGASTRODUODENOSCOPY (EGD) WITH PROPOFOL N/A 10/19/2016   Procedure: ESOPHAGOGASTRODUODENOSCOPY (EGD) WITH PROPOFOL;  Surgeon: Mauri Pole, MD;  Location: [facility];  Service: Endoscopy;  Laterality: N/A;  . HERNIA MESH REMOVAL  02/2013  . uterine ablation  03/2010  . WISDOM TOOTH EXTRACTION       reports that she quit smoking about 4 years ago. Her smoking use included cigarettes. She has a 20.00 pack-year smoking history. she has never used smokeless tobacco. She reports that she does not drink alcohol or use drugs.  Allergies  Allergen Reactions  . Azithromycin Hives    (z pak) hives  . Bee Venom Anaphylaxis  . Flagyl [Metronidazole] Hives and Shortness Of Breath  . Penicillins Shortness Of  Breath and Swelling    Has patient had a PCN reaction causing immediate rash, facial/tongue/throat swelling, SOB or lightheadedness with hypotension: Yes Has patient had a PCN reaction causing severe rash involving mucus membranes or skin necrosis: No Has patient had a PCN reaction that required hospitalization No Has patient had a PCN reaction occurring within the last 10 years: No If all of the above answers are "NO",  then may proceed with Cephalosporin use.  REACTION: swelling and difficulty breathing  . Shellfish Allergy Anaphylaxis  . Klonopin [Clonazepam] Other (See Comments)    Memory difficulty    Family History  Problem Relation Age of Onset  . Allergies Mother   . Heart attack Mother   . Diabetes Mother   . Diabetes Father   . Heart disease Father   . Rheum arthritis Father   . Stroke Father   . Hypertension Unknown   . Ovarian cancer Maternal Aunt   . Lung cancer Maternal Aunt   . Breast cancer Maternal Aunt   . Bone cancer Maternal Aunt   . Stroke Maternal Uncle   . Other Neg Hx      Prior to Admission medications   Medication Sig Start Date End Date Taking? Authorizing Provider  acetaminophen (TYLENOL) 500 MG tablet Take 1,000 mg by mouth every 6 (six) hours as needed for mild pain or moderate pain.   Yes [provider]  alendronate (FOSAMAX) 70 MG tablet Take 1 tablet (70 mg total) by mouth every 7 (seven) days. 12/22/16  Yes Lyndal Pulley, DO  ALPRAZolam Duanne Moron) 0.5 MG tablet Take 1 tablet (0.5 mg total) by mouth 3 (three) times daily as needed. 07/14/17  Yes Biagio Borg, MD  budesonide-formoterol [facility]) 160-4.5 MCG/ACT inhaler Inhale 2 puffs into the lungs 2 (two) times daily. 06/30/16  Yes Collene Gobble, MD  cetirizine (ZYRTEC) 10 MG tablet TAKE 1 TABLET(10 MG) BY MOUTH DAILY 08/24/17  Yes Biagio Borg, MD  Cholecalciferol (VITAMIN D3 PO) Take 500 Units by mouth 3 (three) times daily.   Yes [provider]  cyclobenzaprine (FLEXERIL) 5 MG tablet Take 1 tablet (5 mg total) by mouth at bedtime as needed for muscle spasms. Patient taking differently: Take 10 mg by mouth at bedtime as needed for muscle spasms.  10/17/14  Yes Biagio Borg, MD  Diclofenac Sodium (PENNSAID) 2 % SOLN Apply 1 pump twice daily as needed. 08/10/17  Yes Lyndal Pulley, DO  fluconazole (DIFLUCAN) 150 MG tablet 1 tab by mouth every 3 days 06/29/17  Yes Biagio Borg, MD  folic  acid (FOLVITE) 1 MG tablet TAKE 2 TABLETS BY MOUTH DAILY 08/23/17  Yes Byrum, Rose Fillers, MD  furosemide (LASIX) 40 MG tablet 1 tab by mouth every day in the AM and then 1 as needed only for persistent swelling in the PM 06/16/16  Yes Biagio Borg, MD  gabapentin (NEURONTIN) 300 MG capsule TAKE 1 CAPSULE(300 MG) BY MOUTH THREE TIMES DAILY 10/05/16  Yes Biagio Borg, MD  glucose blood (ONE TOUCH ULTRA TEST) test strip Use to check blood sugars four times a day Dx E11.9 08/11/15  Yes Renato Shin, MD  HUMALOG 100 UNIT/ML injection INJECT UP TO 12 UNITS INTO THE SKIN THREE TIMES DAILY WITH MEALS WITH SLIDING SCALE AS DIRECTED 07/25/17  Yes Biagio Borg, MD  Insulin Syringe-Needle U-100 25G X 1" 1 ML MISC Use to administer humalog insulin three times a day Dx E11.9 10/13/16  Yes John,  Hunt Oris, MD  levalbuterol [facility] HFA) 45 MCG/ACT inhaler Inhale 1-2 puffs into the lungs every 8 (eight) hours as needed for wheezing or shortness of breath. 06/30/16  Yes Collene Gobble, MD  ONE TOUCH ULTRA TEST test strip TEST BLOOD SUGAR FOUR TIMES DAILY 12/22/16  Yes Biagio Borg, MD  oxyCODONE-acetaminophen (PERCOCET) 10-325 MG tablet Take 1 tablet by mouth daily as needed for pain.   Yes [provider]  potassium chloride (K-DUR) 10 MEQ tablet 3 tab by mouth per day only when taking the lasix Patient taking differently: Take 10-20 mEq by mouth 2 (two) times daily. Take 10 meq in the morning and 88mq at night 06/17/16  Yes JBiagio Borg MD  rosuvastatin (CRESTOR) 20 MG tablet Take 1 tablet (20 mg total) by mouth daily. 06/15/17  Yes JBiagio Borg MD  sertraline (ZOLOFT) 100 MG tablet TAKE 2 TABLETS BY MOUTH DAILY 05/11/17  Yes JBiagio Borg MD  SUMAtriptan (IMITREX) 100 MG tablet Take 1 tablet by mouth at the onset of a headache. May repeat in 2 hours if headache persists or recurs. 10/13/16  Yes Jaffe, Adam R, DO  telmisartan (MICARDIS) 40 MG tablet TAKE 1 TABLET(40 MG) BY MOUTH DAILY 05/11/17  Yes JBiagio Borg MD  TURMERIC PO Take 1 tablet by mouth daily.   Yes [provider]  vitamin B-12 (CYANOCOBALAMIN) 1000 MCG tablet Take 1,000 mcg by mouth daily.   Yes [provider]  Vitamin D, Ergocalciferol, (DRISDOL) 50000 units CAPS capsule TAKE 1 CAPSULE BY MOUTH EVERY 7 DAYS 02/16/17  Yes SHulan SaasM, DO  zonisamide (ZONEGRAN) 100 MG capsule Take 1 capsule (100 mg total) by mouth daily. 02/23/17  Yes Jaffe, Adam R, DO  azelastine (ASTELIN) 0.1 % nasal spray Place 2 sprays into both nostrils 2 (two) times daily. Use in each nostril as directed Patient not taking: Reported on 09/18/2017 03/11/16   JBiagio Borg MD  Blood Glucose Monitoring Suppl (ONE TOUCH ULTRA 2) W/DEVICE KIT Use as directed four times daily. Dx: E11.9 08/11/15   ERenato Shin MD  gabapentin (NEURONTIN) 300 MG capsule TAKE 1 CAPSULE(300 MG) BY MOUTH THREE TIMES DAILY Patient not taking: Reported on 09/18/2017 08/24/17   JBiagio Borg MD  lidocaine (XYLOCAINE) 2 % solution Use as directed 20 mLs in the mouth or throat as needed for mouth pain. Patient not taking: Reported on 09/18/2017 01/04/17   JBiagio Borg MD  methotrexate (50 MG/ML) 1 g injection Inject 50 mg into the vein once.    [provider]  methotrexate (RHEUMATREX) 2.5 MG tablet Take 15 mg by mouth every Tuesday.     [provider]  naproxen (NAPROSYN) 500 MG tablet Take 1 tablet (500 mg total) by mouth 2 (two) times daily as needed. Patient not taking: Reported on 07/13/2017 06/14/17   JBiagio Borg MD  omeprazole (PRILOSEC) 40 MG capsule Take 1 capsule (40 mg total) by mouth daily. Patient not taking: Reported on 07/13/2017 06/15/17   JBiagio Borg MD  ondansetron (ZOFRAN-ODT) 8 MG disintegrating tablet Take 1 tablet (8 mg total) by mouth every 8 (eight) hours as needed for nausea or vomiting. Patient not taking: Reported on 09/18/2017 05/14/17   TAbner Greenspan MD  ranitidine (ZANTAC) 150 MG tablet TAKE 1 TABLET(150 MG) BY  MOUTH AT BEDTIME Patient not taking: Reported on 07/13/2017 01/24/17   NMauri Pole MD    Physical Exam: Vitals:   09/18/17 1831  09/18/17 1845 09/18/17 1900 09/18/17 2013  BP:   (!) 133/57 136/73  Pulse: (!) 112 (!) 109 (!) 113 99  Resp:    (!) 24  Temp:    99.9 F (37.7 C)  TempSrc:    Oral  SpO2:  100% 98% 98%      Constitutional: NAD, calm, dyspneic with speech, in apparent discomfort Eyes: PERTLA, lids and conjunctivae normal ENMT: Mucous membranes are moist. Posterior pharynx clear of any exudate or lesions.   Neck: normal, supple, no masses, no thyromegaly Respiratory: Diminished bilaterally with wheezes bilaterally and rhonchi on left. No accessory muscle use.  Cardiovascular: S1 & S2 heard, regular rate and rhythm. No significant JVD. Abdomen: No distension, no tenderness, no masses palpated. Bowel sounds normal.  Musculoskeletal: no clubbing / cyanosis. No joint deformity upper and lower extremities.   Skin: no significant rashes, lesions, ulcers. Warm, dry, well-perfused. Neurologic: CN 2-12 grossly intact. Sensation intact. Strength 5/5 in all 4 limbs.  Psychiatric: Alert and oriented x 3. Calm, cooperative.     Labs on Admission: I have personally reviewed following labs and imaging studies  CBC: Recent Labs  Lab 09/18/17 1847  WBC 12.0*  NEUTROABS 10.3*  HGB 13.5  HCT 39.8  MCV 86.3  PLT 947   Basic Metabolic Panel: Recent Labs  Lab 09/18/17 1847  NA 141  K 3.7  CL 107  CO2 24  GLUCOSE 135*  BUN 8  CREATININE 0.82  CALCIUM 9.6   GFR: CrCl cannot be calculated (Unknown ideal weight.). Liver Function Tests: No results for input(s): AST, ALT, ALKPHOS, BILITOT, PROT, ALBUMIN in the last 168 hours. No results for input(s): LIPASE, AMYLASE in the last 168 hours. No results for input(s): AMMONIA in the last 168 hours. Coagulation Profile: No results for input(s): INR, PROTIME in the last 168 hours. Cardiac Enzymes: No results for input(s):  CKTOTAL, CKMB, CKMBINDEX, TROPONINI in the last 168 hours. BNP (last 3 results) No results for input(s): PROBNP in the last 8760 hours. HbA1C: No results for input(s): HGBA1C in the last 72 hours. CBG: No results for input(s): GLUCAP in the last 168 hours. Lipid Profile: No results for input(s): CHOL, HDL, LDLCALC, TRIG, CHOLHDL, LDLDIRECT in the last 72 hours. Thyroid Function Tests: No results for input(s): TSH, T4TOTAL, FREET4, T3FREE, THYROIDAB in the last 72 hours. Anemia Panel: No results for input(s): VITAMINB12, FOLATE, FERRITIN, TIBC, IRON, RETICCTPCT in the last 72 hours. Urine analysis:    Component Value Date/Time   COLORURINE YELLOW 06/15/2017 1601   APPEARANCEUR CLEAR 06/15/2017 1601   LABSPEC 1.020 06/15/2017 1601   PHURINE 6.0 06/15/2017 1601   GLUCOSEU NEGATIVE 06/15/2017 1601   HGBUR NEGATIVE 06/15/2017 1601   BILIRUBINUR NEGATIVE 06/15/2017 1601   BILIRUBINUR neg 01/28/2013 1242   KETONESUR NEGATIVE 06/15/2017 1601   PROTEINUR NEGATIVE 10/31/2016 1926   UROBILINOGEN 0.2 06/15/2017 1601   NITRITE NEGATIVE 06/15/2017 1601   LEUKOCYTESUR NEGATIVE 06/15/2017 1601   Sepsis Labs: '@LABRCNTIP' (procalcitonin:4,lacticidven:4) )No results found for this or any previous visit (from the past 240 hour(s)).   Radiological Exams on Admission: Dg Chest 2 View  Result Date: 09/18/2017 CLINICAL DATA:  41 year old female with cough, congestion, shortness of breath. History of sarcoidosis. EXAM: CHEST  2 VIEW COMPARISON:  11/21/2015 FINDINGS: The heart size and mediastinal contours are within normal limits. There is prominence of the right hilum, similar to examination. Vague right basilar opacities suggesting atelectasis versus early infiltrate. The left lung is clear. No sizeable effusions or pneumothorax. The  visualized skeletal structures are unremarkable. IMPRESSION: Right basilar atelectasis versus early infiltrate. Follow-up to resolution recommended. Electronically Signed    By: Kristopher Oppenheim M.D.   On: 09/18/2017 18:08    EKG: Not performed.   Assessment/Plan  1. CAP - Pt presents with 2 days of fevers, aches, dyspnea, and productive cough  - CXR features an opacity at right base, likely reflecting an infiltrate  - Blood cultures collected in ED, will add sputum culture, check strep pneumo antigen and respiratory virus panel  - Treated in ED with empiric Levaquin, will continue in setting of severe penicillin allergy  - Significant wheezing on admission and adjunctive glucocorticoid started  - Trend procalcitonin, follow resp virus panel and cultures, tailor therapy as indicated   2. Sarcoidosis  - Presents with increased cough, wheezing, sputum production, likely secondary to #1  - Continued on ICS/LABA, started on prn albuterol nebs and systemic steroid    3. Chronic diastolic CHF  - Appears well-compensated  - Given a liter of NS in ED  - Continue Lasix 40 mg qD and ARB, follow daily wts    4. Rheumatoid arthritis  - Currently complaining of diffuse aches, likely secondary to #1 - Managed by rheumatologist with methotrexate  - Currently on steroid as above    5. Type II DM  - A1c was 6.1% in August 2018  - Managed at home with Humalog 0-12 units TID   - Follow CBG's and start SSI with Novolog   6. Depression, anxiety  - Stable - Continue Zoloft and prn Xanax     DVT prophylaxis: Lovenox Code Status: Full  Family Communication: Discussed with patient Disposition Plan: Admit to med-surg Consults called: None Admission status: Inpatient    Vianne Bulls, MD [facility] Pager [phone]  If 7PM-7AM, please contact night-coverage [email or web address] Password [email or web address]  09/18/2017, 9:11 PM

## 2017-09-18 NOTE — ED Triage Notes (Signed)
Pt complaining of SOB and generalized body aches starting yesterday. Also endorses a productive cough with green sputum. She has a hx of sarcoidosis. A&Ox4. Ambulatory.

## 2017-09-18 NOTE — ED Notes (Signed)
Call report to [place] at [phone] at [phone]

## 2017-09-18 NOTE — ED Provider Notes (Signed)
Medical screening examination/treatment/procedure(s) were conducted as a shared visit with non-physician practitioner(s) and myself.  I personally evaluated the patient during the encounter.   EKG Interpretation None     41 year old female with history of sarcoid presents with cough productive of sputum.  X-ray consistent with pneumonia.  Given breathing treatments here and started on IV antibiotics will be admitted to the hospitalist service.   Lacretia Leigh, MD 09/18/17 2035

## 2017-09-18 NOTE — ED Provider Notes (Signed)
[facility] DEPT Provider Note   CSN: [id number] Arrival date & time: 09/18/17  [place]     History   Chief Complaint Chief Complaint  Patient presents with  . Shortness of Breath  . Generalized Body Aches    HPI DEEPA BARTHEL is a 41 y.o. female with PMH/o Sarcoidosis who presents with 2 days of SOB, generalized malaise and cough.  Patient reports that cough is intermittently productive of green sputum.  She has tried Vicks and Robitussin with minimal improvement in symptoms.  Patient reports that the shortness of breath has progressively worsened over the last 48 hours.  Patient reports that she does have albuterol and Spiriva inhalers at home, which she has used with no improvement.  Patient reports that she has had difficulty sleeping on her back secondary to symptoms.  Patient reports that she has had to sleep sitting up because it makes the cough and the shortness of breath worse.  Patient also feel like she has had some bilateral lower extremity edema.  Patient also reports that she had a fever over the last several days that measured 102.  She states that she has been taking medication and the fever has improved. She denies any OCP use, recent immobilization, prior history of DVT/PE, recent surgery, leg swelling, or long travel.      Past Medical History:  Diagnosis Date  . Allergic rhinitis, cause unspecified   . Anemia   . Anxiety   . Depression   . Diabetes mellitus type II    steroid related, patient states "im no longer diabetic"  . Essential hypertension 08/08/2015  . GERD (gastroesophageal reflux disease)   . History of blood transfusion   . Hyperlipidemia   . Menorrhagia   . Migraine   . Morbid obesity ([place])   . Otitis media 06/28/2015  . Peptic ulcer disease   . Positive ANA (antinuclear antibody) 02/14/2012  . Sarcoid    including hand per rheumatology-Dr. Amil Amen  . Sarcoidosis of lung ([place])   . Shortness of breath    on  exertion  . Varicose veins with pain     Patient Active Problem List   Diagnosis Date Noted  . Rheumatoid arthritis ([place]) 09/18/2017  . CAP (community acquired pneumonia) 09/18/2017  . Pancreatitis 06/26/2017  . Polyarthralgia 06/26/2017  . Right ankle sprain 06/26/2017  . Oral thrush 06/22/2017  . Possible exposure to STD 06/15/2017  . Diarrhea 04/10/2017  . Bilateral hand swelling 04/07/2017  . Abdominal pain 04/07/2017  . Abnormality of gait 01/26/2017  . Avulsion fracture of right ankle 12/01/2016  . Ulcer of esophagus with bleeding   . Gastritis and gastroduodenitis   . Blepharitis of right upper eyelid 10/09/2016  . Peroneal tendinitis of lower leg, right 10/06/2016  . Right ankle pain 09/16/2016  . Dizziness 03/11/2016  . Mixed headache 03/09/2016  . Chronic venous insufficiency 02/24/2016  . Gastroenteritis 12/30/2015  . Headache 12/30/2015  . Epistaxis 12/09/2015  . 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 Cervical muscle strain 12/03/2015  . Right-sided face pain 11/26/2015  . Multiple contusions 11/26/2015  . Neck pain, bilateral posterior 11/26/2015  . Lower back pain 11/26/2015  . Spinal stenosis of lumbar region 10/28/2015  . Chronic sciatica of left side 10/28/2015  . DOE (dyspnea on exertion) 09/02/2015  . Acute bronchitis 08/22/2015  . Essential hypertension 08/08/2015  . Diaphoresis 08/07/2015  . Cough 07/02/2015  . Vaginitis and vulvovaginitis 06/29/2015  . Otitis media 06/28/2015  . Ritta Slot  06/24/2015  . Nausea & vomiting 06/10/2015  . Acute upper respiratory infection 11/14/2014  . Wheezing 11/14/2014  . Angioedema 03/19/2014  . Vaginitis 11/19/2012  . Smoker 11/19/2012  . Positive ANA (antinuclear antibody) 02/14/2012  . Polyarthritis 02/11/2012  . Migraine 04/05/2011  . Preventative health care 02/21/2011  . Allergic rhinitis 02/17/2011  . URTICARIA 09/29/2010  . Depression 09/14/2010  . Diabetes mellitus with neuropathy ([place]) 07/31/2010    . PERIPHERAL EDEMA 07/31/2010  . INSOMNIA-SLEEP DISORDER-UNSPEC 06/19/2010  . 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 MENORRHAGIA 05/15/2010  . Hyperlipidemia 12/17/2009  . Iron deficiency anemia 12/17/2009  . Anxiety state 12/17/2009  . Severe obesity (BMI >= 40) ([place]) 07/02/2007  . Gastroesophageal reflux disease 07/02/2007  . PEPTIC ULCER DISEASE 07/02/2007    Past Surgical History:  Procedure Laterality Date  . ABDOMINAL HYSTERECTOMY    . COLONOSCOPY WITH PROPOFOL N/A 10/19/2016   Procedure: COLONOSCOPY WITH PROPOFOL;  Surgeon: Mauri Pole, MD;  Location: [facility] ENDOSCOPY;  Service: Endoscopy;  Laterality: N/A;  . ESOPHAGOGASTRODUODENOSCOPY (EGD) WITH PROPOFOL N/A 10/19/2016   Procedure: ESOPHAGOGASTRODUODENOSCOPY (EGD) WITH PROPOFOL;  Surgeon: Mauri Pole, MD;  Location: [facility] ENDOSCOPY;  Service: Endoscopy;  Laterality: N/A;  . HERNIA MESH REMOVAL  02/2013  . uterine ablation  03/2010  . WISDOM TOOTH EXTRACTION      OB History    No data available       Home Medications    Prior to Admission medications   Medication Sig Start Date End Date Taking? Authorizing Provider  acetaminophen (TYLENOL) 500 MG tablet Take 1,000 mg by mouth every 6 (six) hours as needed for mild pain or moderate pain.   Yes [provider]  alendronate (FOSAMAX) 70 MG tablet Take 1 tablet (70 mg total) by mouth every 7 (seven) days. 12/22/16  Yes Lyndal Pulley, DO  ALPRAZolam Duanne Moron) 0.5 MG tablet Take 1 tablet (0.5 mg total) by mouth 3 (three) times daily as needed. 07/14/17  Yes Biagio Borg, MD  budesonide-formoterol [facility]) 160-4.5 MCG/ACT inhaler Inhale 2 puffs into the lungs 2 (two) times daily. 06/30/16  Yes Collene Gobble, MD  cetirizine (ZYRTEC) 10 MG tablet TAKE 1 TABLET(10 MG) BY MOUTH DAILY 08/24/17  Yes Biagio Borg, MD  Cholecalciferol (VITAMIN D3 PO) Take 500 Units by mouth 3 (three) times daily.   Yes [provider]  cyclobenzaprine (FLEXERIL) 5 MG tablet  Take 1 tablet (5 mg total) by mouth at bedtime as needed for muscle spasms. Patient taking differently: Take 10 mg by mouth at bedtime as needed for muscle spasms.  10/17/14  Yes Biagio Borg, MD  Diclofenac Sodium (PENNSAID) 2 % SOLN Apply 1 pump twice daily as needed. 08/10/17  Yes Lyndal Pulley, DO  fluconazole (DIFLUCAN) 150 MG tablet 1 tab by mouth every 3 days 06/29/17  Yes Biagio Borg, MD  folic acid (FOLVITE) 1 MG tablet TAKE 2 TABLETS BY MOUTH DAILY 08/23/17  Yes Byrum, Rose Fillers, MD  furosemide (LASIX) 40 MG tablet 1 tab by mouth every day in the AM and then 1 as needed only for persistent swelling in the PM 06/16/16  Yes Biagio Borg, MD  gabapentin (NEURONTIN) 300 MG capsule TAKE 1 CAPSULE(300 MG) BY MOUTH THREE TIMES DAILY 10/05/16  Yes Biagio Borg, MD  glucose blood (ONE TOUCH ULTRA TEST) test strip Use to check blood sugars four times a day Dx E11.9 08/11/15  Yes Renato Shin, MD  HUMALOG 100 UNIT/ML injection INJECT  UP TO 12 UNITS INTO THE SKIN THREE TIMES DAILY WITH MEALS WITH SLIDING SCALE AS DIRECTED 07/25/17  Yes Biagio Borg, MD  Insulin Syringe-Needle U-100 25G X 1" 1 ML MISC Use to administer humalog insulin three times a day Dx E11.9 10/13/16  Yes Biagio Borg, MD  levalbuterol [facility] HFA) 45 MCG/ACT inhaler Inhale 1-2 puffs into the lungs every 8 (eight) hours as needed for wheezing or shortness of breath. 06/30/16  Yes Collene Gobble, MD  ONE TOUCH ULTRA TEST test strip TEST BLOOD SUGAR FOUR TIMES DAILY 12/22/16  Yes Biagio Borg, MD  oxyCODONE-acetaminophen (PERCOCET) 10-325 MG tablet Take 1 tablet by mouth daily as needed for pain.   Yes [provider]  potassium chloride (K-DUR) 10 MEQ tablet 3 tab by mouth per day only when taking the lasix Patient taking differently: Take 10-20 mEq by mouth 2 (two) times daily. Take 10 meq in the morning and 74mq at night 06/17/16  Yes JBiagio Borg MD  rosuvastatin (CRESTOR) 20 MG tablet Take 1 tablet (20 mg total) by  mouth daily. 06/15/17  Yes JBiagio Borg MD  sertraline (ZOLOFT) 100 MG tablet TAKE 2 TABLETS BY MOUTH DAILY 05/11/17  Yes JBiagio Borg MD  SUMAtriptan (IMITREX) 100 MG tablet Take 1 tablet by mouth at the onset of a headache. May repeat in 2 hours if headache persists or recurs. 10/13/16  Yes Jaffe, Adam R, DO  telmisartan (MICARDIS) 40 MG tablet TAKE 1 TABLET(40 MG) BY MOUTH DAILY 05/11/17  Yes JBiagio Borg MD  TURMERIC PO Take 1 tablet by mouth daily.   Yes [provider]  vitamin B-12 (CYANOCOBALAMIN) 1000 MCG tablet Take 1,000 mcg by mouth daily.   Yes [provider]  Vitamin D, Ergocalciferol, (DRISDOL) 50000 units CAPS capsule TAKE 1 CAPSULE BY MOUTH EVERY 7 DAYS 02/16/17  Yes SHulan SaasM, DO  zonisamide (ZONEGRAN) 100 MG capsule Take 1 capsule (100 mg total) by mouth daily. 02/23/17  Yes Jaffe, Adam R, DO  azelastine (ASTELIN) 0.1 % nasal spray Place 2 sprays into both nostrils 2 (two) times daily. Use in each nostril as directed Patient not taking: Reported on 09/18/2017 03/11/16   JBiagio Borg MD  Blood Glucose Monitoring Suppl (ONE TOUCH ULTRA 2) W/DEVICE KIT Use as directed four times daily. Dx: E11.9 08/11/15   ERenato Shin MD  gabapentin (NEURONTIN) 300 MG capsule TAKE 1 CAPSULE(300 MG) BY MOUTH THREE TIMES DAILY Patient not taking: Reported on 09/18/2017 08/24/17   JBiagio Borg MD  lidocaine (XYLOCAINE) 2 % solution Use as directed 20 mLs in the mouth or throat as needed for mouth pain. Patient not taking: Reported on 09/18/2017 01/04/17   JBiagio Borg MD  methotrexate (50 MG/ML) 1 g injection Inject 50 mg into the vein once.    [provider]  methotrexate (RHEUMATREX) 2.5 MG tablet Take 15 mg by mouth every Tuesday.     [provider]  naproxen (NAPROSYN) 500 MG tablet Take 1 tablet (500 mg total) by mouth 2 (two) times daily as needed. Patient not taking: Reported on 07/13/2017 06/14/17   JBiagio Borg MD  nystatin (MYCOSTATIN)  100000 UNIT/ML suspension Swish and swallow 52mfour times daily x5-7days Patient not taking: Reported on 07/13/2017 06/22/17   GrMagdalen SpatzNP  omeprazole (PRILOSEC) 40 MG capsule Take 1 capsule (40 mg total) by mouth daily. Patient not taking: Reported on 07/13/2017 06/15/17   JoBiagio Borg  MD  ondansetron (ZOFRAN-ODT) 8 MG disintegrating tablet Take 1 tablet (8 mg total) by mouth every 8 (eight) hours as needed for nausea or vomiting. Patient not taking: Reported on 09/18/2017 05/14/17   Tower, Wynelle Fanny, MD  predniSONE (DELTASONE) 10 MG tablet Take 4 tabs for 2 days, then 3 tabs for 2 days, 2 tabs for 2 days, then 1 tab for 2 days, then stop. Patient not taking: Reported on 07/13/2017 06/22/17   Magdalen Spatz, NP  ranitidine (ZANTAC) 150 MG tablet TAKE 1 TABLET(150 MG) BY MOUTH AT BEDTIME Patient not taking: Reported on 07/13/2017 01/24/17   Mauri Pole, MD    Family History Family History  Problem Relation Age of Onset  . Allergies Mother   . Heart attack Mother   . Diabetes Mother   . Diabetes Father   . Heart disease Father   . Rheum arthritis Father   . Stroke Father   . Hypertension Unknown   . Ovarian cancer Maternal Aunt   . Lung cancer Maternal Aunt   . Breast cancer Maternal Aunt   . Bone cancer Maternal Aunt   . Stroke Maternal Uncle   . Other Neg Hx     Social History Social History   Tobacco Use  . Smoking status: Former Smoker    Packs/day: 1.00    Years: 20.00    Pack years: 20.00    Types: Cigarettes    Last attempt to quit: 10/25/2012    Years since quitting: 4.9  . Smokeless tobacco: Never Used  . Tobacco comment: QUIT 04/2010 AND STARTED BACK 2014 X 3 MONTHS. less than 1 ppd.  started at age 30.    Substance Use Topics  . Alcohol use: No  . Drug use: No     Allergies   Azithromycin; Bee venom; Flagyl [metronidazole]; Penicillins; Shellfish allergy; and Klonopin [clonazepam]   Review of Systems Review of Systems     Physical Exam Updated  Vital Signs BP 136/73 (BP Location: Left Arm)   Pulse 99   Temp 99.9 F (37.7 C) (Oral)   Resp (!) 24   LMP 07/17/2012   SpO2 98%   Physical Exam  Constitutional: She is oriented to person, place, and time. She appears well-developed and well-nourished.  Appears uncomfortable but no acute distress   HENT:  Head: Normocephalic and atraumatic.  Nose: Mucosal edema present.  Mouth/Throat: Oropharynx is clear and moist and mucous membranes are normal.  Eyes: Conjunctivae, EOM and lids are normal. Pupils are equal, round, and reactive to light.  Neck: Full passive range of motion without pain.  Cardiovascular: Regular rhythm, normal heart sounds and normal pulses. Tachycardia present. Exam reveals no gallop and no friction rub.  No murmur heard. Pulses:      Radial pulses are 2+ on the right side, and 2+ on the left side.       Dorsalis pedis pulses are 2+ on the right side, and 2+ on the left side.  Pulmonary/Chest: Effort normal. Tachypnea noted. She has wheezes. She has rhonchi. She has rales.  Slight increased work of breathing. She is intermittently coughing.  Lung exam shows diffuse rales, rhonchi throughout the lower lung fields.  There is some expiratory wheezing noted to the upper lung fields.  Abdominal: Soft. Normal appearance. There is no tenderness. There is no rigidity and no guarding.  Musculoskeletal: Normal range of motion.  Neurological: She is alert and oriented to person, place, and time.  Skin: Skin is warm and  dry. Capillary refill takes less than 2 seconds.  Psychiatric: She has a normal mood and affect. Her speech is normal.  Nursing note and vitals reviewed.    ED Treatments / Results  Labs (all labs ordered are listed, but only abnormal results are displayed) Labs Reviewed  CBC WITH DIFFERENTIAL/PLATELET - Abnormal; Notable for the following components:      Result Value   WBC 12.0 (*)    Neutro Abs 10.3 (*)    All other components within normal limits    BASIC METABOLIC PANEL - Abnormal; Notable for the following components:   Glucose, Bld 135 (*)    All other components within normal limits  CULTURE, BLOOD (ROUTINE X 2)  CULTURE, BLOOD (ROUTINE X 2)  I-STAT CG4 LACTIC ACID, ED    EKG  EKG Interpretation None       Radiology Dg Chest 2 View  Result Date: 09/18/2017 CLINICAL DATA:  41 year old female with cough, congestion, shortness of breath. History of sarcoidosis. EXAM: CHEST  2 VIEW COMPARISON:  11/21/2015 FINDINGS: The heart size and mediastinal contours are within normal limits. There is prominence of the right hilum, similar to examination. Vague right basilar opacities suggesting atelectasis versus early infiltrate. The left lung is clear. No sizeable effusions or pneumothorax. The visualized skeletal structures are unremarkable. IMPRESSION: Right basilar atelectasis versus early infiltrate. Follow-up to resolution recommended. Electronically Signed   By: Kristopher Oppenheim M.D.   On: 09/18/2017 18:08    Procedures Procedures (including critical care time)  Medications Ordered in ED Medications  levofloxacin (LEVAQUIN) IVPB 750 mg (not administered)  albuterol (PROVENTIL) (2.5 MG/3ML) 0.083% nebulizer solution 5 mg (5 mg Nebulization Given 09/18/17 1736)  ipratropium-albuterol (DUONEB) 0.5-2.5 (3) MG/3ML nebulizer solution 3 mL (3 mLs Nebulization Given 09/18/17 1830)  sodium chloride 0.9 % bolus 1,000 mL (1,000 mLs Intravenous New Bag/Given 09/18/17 1905)  albuterol (PROVENTIL) (2.5 MG/3ML) 0.083% nebulizer solution 2.5 mg (2.5 mg Nebulization Given 09/18/17 1918)     Initial Impression / Assessment and Plan / ED Course  I have reviewed the triage vital signs and the nursing notes.  Pertinent labs & imaging results that were available during my care of the patient were reviewed by me and considered in my medical decision making (see chart for details).     41 year old female with past medical history of sarcoidosis  who presents with 2 days of shortness of breath and productive cough.  Subjective reports of fever.  States that she has not been eating and drinking properly.  Does use albuterol and Spiriva inhalers but with no improvement.  On initial ED arrival, patient was afebrile, tachycardic, tachypneic, hypertensive.  Initial evaluations with wheezing on exam and patient was then started on a nebulizer treatment in triage.  His initial lung exam shows the patient still has some lower lung bases bilaterally.  Consider pneumonia versus bronchitis versus upper respiratory.  History/physical examination are not concerning for PE or ACS etiology.  Plan to check basic labs including CBC, BMP.  Initial chest x-ray ordered at triage.  Reevaluation after first nebulizer treatment.  Patient had some improvement and increased work of breathing.  Lung sounds slightly improved though she does still have some expiratory wheezing throughout the upper lung fields.  We will plan to give second nebulizer treatment.  Chest x-ray reviewed.  There is concern for right basilar atelectasis versus infiltrate.  Given rales noted on exam, lean more towards infiltrate.  Discussed results with patient.  Reevaluation after second nebulizer.  Patient had improved lung sounds.  Still some mild wheezing noted.  Will do third additional nebulizer treatment.   Labs reviewed.  Patient with leukocytosis of 12.1.  Reevaluation..  Patient still with some increased work of breathing.  Patient also has RA and is on chronic methotrexate.  Given concern for immunocompetent state, continued increased work of breathing and pneumonia found on x-ray, will consult hospitalist for admission for IV antibiotic for treatment of community-acquired pneumonia.  Will add additional blood cultures and I sent lactic acid.  Lactic acid is 1.18.  Discussed with pharmacy.  Given patient's allergies to azithromycin and penicillins, will plan to start patient on IV Levaquin  for treatment of community acquired pneumonia.  Discussed patient with Dr. Myna Hidalgo (hospitalist) will admit.   Final Clinical Impressions(s) / ED Diagnoses   Final diagnoses:  Community acquired pneumonia of right lower lobe of lung [facility])    ED Discharge Orders    None       Volanda Napoleon, PA-C 09/18/17 2058

## 2017-09-19 LAB — RESPIRATORY PANEL BY PCR
ADENOVIRUS-RVPPCR: NOT DETECTED
Bordetella pertussis: NOT DETECTED
CORONAVIRUS 229E-RVPPCR: NOT DETECTED
CORONAVIRUS HKU1-RVPPCR: NOT DETECTED
CORONAVIRUS NL63-RVPPCR: NOT DETECTED
CORONAVIRUS OC43-RVPPCR: NOT DETECTED
Chlamydophila pneumoniae: NOT DETECTED
INFLUENZA B-RVPPCR: NOT DETECTED
Influenza A: NOT DETECTED
MYCOPLASMA PNEUMONIAE-RVPPCR: NOT DETECTED
Metapneumovirus: NOT DETECTED
PARAINFLUENZA VIRUS 1-RVPPCR: NOT DETECTED
PARAINFLUENZA VIRUS 2-RVPPCR: NOT DETECTED
Parainfluenza Virus 3: NOT DETECTED
Parainfluenza Virus 4: NOT DETECTED
Respiratory Syncytial Virus: NOT DETECTED
Rhinovirus / Enterovirus: DETECTED — AB

## 2017-09-19 LAB — HIV ANTIBODY (ROUTINE TESTING W REFLEX): HIV SCREEN 4TH GENERATION: NONREACTIVE

## 2017-09-19 LAB — EXPECTORATED SPUTUM ASSESSMENT W REFEX TO RESP CULTURE

## 2017-09-19 LAB — CBC WITH DIFFERENTIAL/PLATELET
BASOS ABS: 0 10*3/uL (ref 0.0–0.1)
BASOS PCT: 0 %
EOS ABS: 0 10*3/uL (ref 0.0–0.7)
Eosinophils Relative: 0 %
HCT: 35.7 % — ABNORMAL LOW (ref 36.0–46.0)
Hemoglobin: 12.1 g/dL (ref 12.0–15.0)
Lymphocytes Relative: 5 %
Lymphs Abs: 0.5 10*3/uL — ABNORMAL LOW (ref 0.7–4.0)
MCH: 29.3 pg (ref 26.0–34.0)
MCHC: 33.9 g/dL (ref 30.0–36.0)
MCV: 86.4 fL (ref 78.0–100.0)
MONO ABS: 0.1 10*3/uL (ref 0.1–1.0)
MONOS PCT: 1 %
Neutro Abs: 10.8 10*3/uL — ABNORMAL HIGH (ref 1.7–7.7)
Neutrophils Relative %: 94 %
PLATELETS: 283 10*3/uL (ref 150–400)
RBC: 4.13 MIL/uL (ref 3.87–5.11)
RDW: 13.9 % (ref 11.5–15.5)
WBC: 11.4 10*3/uL — ABNORMAL HIGH (ref 4.0–10.5)

## 2017-09-19 LAB — GLUCOSE, CAPILLARY
GLUCOSE-CAPILLARY: 164 mg/dL — AB (ref 65–99)
GLUCOSE-CAPILLARY: 200 mg/dL — AB (ref 65–99)
Glucose-Capillary: 198 mg/dL — ABNORMAL HIGH (ref 65–99)
Glucose-Capillary: 165 mg/dL — ABNORMAL HIGH (ref 65–99)
Glucose-Capillary: 170 mg/dL — ABNORMAL HIGH (ref 65–99)

## 2017-09-19 LAB — PROCALCITONIN: Procalcitonin: <0.1 ng/mL

## 2017-09-19 LAB — BASIC METABOLIC PANEL
ANION GAP: 6 (ref 5–15)
BUN: 9 mg/dL (ref 6–20)
CALCIUM: 8.9 mg/dL (ref 8.9–10.3)
CO2: 21 mmol/L — ABNORMAL LOW (ref 22–32)
CREATININE: 0.72 mg/dL (ref 0.44–1.00)
Chloride: 110 mmol/L (ref 101–111)
GLUCOSE: 188 mg/dL — AB (ref 65–99)
Potassium: 4.3 mmol/L (ref 3.5–5.1)
Sodium: 137 mmol/L (ref 135–145)

## 2017-09-19 LAB — STREP PNEUMONIAE URINARY ANTIGEN: STREP PNEUMO URINARY ANTIGEN: NEGATIVE

## 2017-09-19 LAB — EXPECTORATED SPUTUM ASSESSMENT W GRAM STAIN, RFLX TO RESP C

## 2017-09-19 MED ORDER — ENOXAPARIN SODIUM 80 MG/0.8ML ~~LOC~~ SOLN
0.5000 mg/kg | Freq: Every day | SUBCUTANEOUS | Status: DC
  Administered 2017-09-19 – 2017-09-21 (×3): 75 mg via SUBCUTANEOUS
  Filled 2017-09-19 (×3): qty 0.8

## 2017-09-19 MED ORDER — LEVALBUTEROL HCL 0.63 MG/3ML IN NEBU
INHALATION_SOLUTION | RESPIRATORY_TRACT | Status: AC
  Administered 2017-09-19: 0.63 mg
  Filled 2017-09-19: qty 3

## 2017-09-19 MED ORDER — BENZONATATE 100 MG PO CAPS
200.0000 mg | ORAL_CAPSULE | Freq: Three times a day (TID) | ORAL | Status: DC | PRN
  Administered 2017-09-19 (×2): 200 mg via ORAL
  Filled 2017-09-19 (×2): qty 2

## 2017-09-19 MED ORDER — LEVALBUTEROL HCL 0.63 MG/3ML IN NEBU: 0.6300 mg | INHALATION_SOLUTION | Freq: Four times a day (QID) | RESPIRATORY_TRACT | Status: DC | PRN

## 2017-09-19 NOTE — Progress Notes (Signed)
PROGRESS NOTE    Nicole Melendez  LYY:[id number] DOB: 1976-09-01 DOA: 09/18/2017 PCP: Biagio Borg, MD  Brief Narrative: Nicole Melendez is a 41 y.o. female with medical history significant for for depression, anxiety, type 2 diabetes mellitus, hypertension,  obesity, and pulmonary sarcoidosis, now presenting to the emergency department with 2 days of fevers, aches, dyspnea, and cough productive of thick green sputum.  She is admitted for right lower lobe pneumonia.   Assessment & Plan:   Principal Problem:   CAP (community acquired pneumonia) Active Problems:   PULMONARY SARCOIDOSIS   Diabetes mellitus with neuropathy ([place])   Anxiety state   Depression   Gastroesophageal reflux disease   Essential hypertension   Rheumatoid arthritis ([place])   Chronic diastolic CHF (congestive heart failure) ([place])  Right lower lobe pneumonia/community-acquired pneumonia/sarcoidosis flareup Admitted for IV antibiotics, duo nebs.  Blood cultures and sputum cultures ordered and are pending.  Low-grade fever and mild leukocytosis.  Pro-calcitonin negative lactic acid normalized.  Elevated absolute neutrophil count on differential.  Respiratory panel shows rhinovirus.  Urine negative for strep pneumo antigen.  HIV negative. Recommend continue IV antibiotics for 24 more hours and reassess. Resume IV steroids, Tessalon.   Type 2 diabetes Resume sliding scale insulin.   Hypertension well controlled.  Chronic diastolic heart failure She appears to be compensated.   GERD stable.   DVT prophylaxis: Lovenox Code Status: Full code Family Communication: None at bedside Disposition Plan: (Home in 1-2 days  Consultants:   None   Procedures: None  Antimicrobials: IV Levaquin  Subjective: She reports breathing better but not back to baseline she is still having shortness of breath and cough when getting out of bed.  Objective: Vitals:   09/18/17 1900 09/18/17 2013 09/18/17 2221 09/19/17  0518  BP: (!) 133/57 136/73 134/88 (!) 121/51  Pulse: (!) 113 99 (!) 106 80  Resp:  (!) 24 (!) 26 (!) 24  Temp:  99.9 F (37.7 C) 100.1 F (37.8 C) 98.2 F (36.8 C)  TempSrc:  Oral Oral Oral  SpO2: 98% 98% 100% 98%  Weight:   (!) 146.5 kg (322 lb 15.6 oz)   Height:   5\' 6"  (1.676 m)     Intake/Output Summary (Last 24 hours) at 09/19/2017 1151 Last data filed at 09/19/2017 0600 Gross per 24 hour  Intake 500 ml  Output 626 ml  Net -126 ml   Filed Weights   09/18/17 2221  Weight: (!) 146.5 kg (322 lb 15.6 oz)    Examination:  General exam: Appears calm and comfortable  Respiratory system: biLateral anteriorly scattered wheezing. Cardiovascular system: S1 & S2 heard, RRR. No JVD, murmurs, rubs, gallops or clicks. No pedal edema. Gastrointestinal system: Abdomen is nondistended, soft and nontender. No organomegaly or masses felt. Normal bowel sounds heard. Central nervous system: Alert and oriented. No focal neurological deficits. Extremities: Symmetric 5 x 5 power. Skin: No rashes, lesions or ulcers Psychiatry: Judgement and insight appear normal. Mood & affect appropriate.     Data Reviewed: I have personally reviewed following labs and imaging studies  CBC: Recent Labs  Lab 09/18/17 1847 09/19/17 0502  WBC 12.0* 11.4*  NEUTROABS 10.3* 10.8*  HGB 13.5 12.1  HCT 39.8 35.7*  MCV 86.3 86.4  PLT 299 127   Basic Metabolic Panel: Recent Labs  Lab 09/18/17 1847 09/19/17 0502  NA 141 137  K 3.7 4.3  CL 107 110  CO2 24 21*  GLUCOSE 135* 188*  BUN 8 9  CREATININE 0.82 0.72  CALCIUM 9.6 8.9   GFR: Estimated Creatinine Clearance: 134.8 mL/min (by C-G formula based on SCr of 0.72 mg/dL). Liver Function Tests: No results for input(s): AST, ALT, ALKPHOS, BILITOT, PROT, ALBUMIN in the last 168 hours. No results for input(s): LIPASE, AMYLASE in the last 168 hours. No results for input(s): AMMONIA in the last 168 hours. Coagulation Profile: No results for  input(s): INR, PROTIME in the last 168 hours. Cardiac Enzymes: No results for input(s): CKTOTAL, CKMB, CKMBINDEX, TROPONINI in the last 168 hours. BNP (last 3 results) No results for input(s): PROBNP in the last 8760 hours. HbA1C: No results for input(s): HGBA1C in the last 72 hours. CBG: Recent Labs  Lab 09/18/17 2219 09/19/17 0737 09/19/17 1132  GLUCAP 198* 164* 165*   Lipid Profile: No results for input(s): CHOL, HDL, LDLCALC, TRIG, CHOLHDL, LDLDIRECT in the last 72 hours. Thyroid Function Tests: No results for input(s): TSH, T4TOTAL, FREET4, T3FREE, THYROIDAB in the last 72 hours. Anemia Panel: No results for input(s): VITAMINB12, FOLATE, FERRITIN, TIBC, IRON, RETICCTPCT in the last 72 hours. Sepsis Labs: Recent Labs  Lab 09/18/17 2025 09/18/17 2225 09/19/17 0502  PROCALCITON  --  <0.10 <0.10  LATICACIDVEN 1.18  --   --     Recent Results (from the past 240 hour(s))  Culture, sputum-assessment     Status: None   Collection Time: 09/19/17  2:30 AM  Result Value Ref Range Status   Specimen Description EXPECTORATED SPUTUM  Final   Special Requests Immunocompromised  Final   Sputum evaluation THIS SPECIMEN IS ACCEPTABLE FOR SPUTUM CULTURE  Final   Report Status 09/19/2017 FINAL  Final  Culture, respiratory (NON-Expectorated)     Status: None (Preliminary result)   Collection Time: 09/19/17  2:30 AM  Result Value Ref Range Status   Specimen Description EXPECTORATED SPUTUM  Final   Special Requests Immunocompromised Reflexed from [id number]  Final   Gram Stain   Final    FEW WBC PRESENT, PREDOMINANTLY PMN RARE SQUAMOUS EPITHELIAL CELLS PRESENT RARE GRAM POSITIVE COCCI IN PAIRS RARE GRAM POSITIVE RODS Performed at [facility], [place] [address], [place], [place] [address]    Culture PENDING  Incomplete   Report Status PENDING  Incomplete         Radiology Studies: Dg Chest 2 View  Result Date: 09/18/2017 CLINICAL DATA:  41 year old female with cough,  congestion, shortness of breath. History of sarcoidosis. EXAM: CHEST  2 VIEW COMPARISON:  11/21/2015 FINDINGS: The heart size and mediastinal contours are within normal limits. There is prominence of the right hilum, similar to examination. Vague right basilar opacities suggesting atelectasis versus early infiltrate. The left lung is clear. No sizeable effusions or pneumothorax. The visualized skeletal structures are unremarkable. IMPRESSION: Right basilar atelectasis versus early infiltrate. Follow-up to resolution recommended. Electronically Signed   By: Kristopher Oppenheim M.D.   On: 09/18/2017 18:08        Scheduled Meds: . cholecalciferol  1,000 Units Oral Daily  . enoxaparin (LOVENOX) injection  0.5 mg/kg Subcutaneous QHS  . folic acid  2 mg Oral Daily  . furosemide  40 mg Oral Daily  . gabapentin  300 mg Oral TID  . insulin aspart  0-5 Units Subcutaneous QHS  . insulin aspart  0-9 Units Subcutaneous TID WC  . irbesartan  150 mg Oral Daily  . loratadine  10 mg Oral Daily  . methylPREDNISolone (SOLU-MEDROL) injection  40 mg Intravenous Q8H  . mometasone-formoterol  2 puff Inhalation BID  .  potassium chloride SA  10 mEq Oral Daily  . potassium chloride SA  20 mEq Oral QHS  . rosuvastatin  20 mg Oral q1800  . sertraline  200 mg Oral Daily  . sodium chloride flush  3 mL Intravenous Q12H  . vitamin B-12  1,000 mcg Oral Daily  . zonisamide  100 mg Oral Daily   Continuous Infusions: . sodium chloride    . levofloxacin (LEVAQUIN) IV       LOS: 1 day    Time spent: 35 min    Hosie Poisson, MD [facility] Pager [phone]  If 7PM-7AM, please contact night-coverage [email or web address] Password [facility] 09/19/2017, 11:51 AM

## 2017-09-20 ENCOUNTER — Other Ambulatory Visit: Payer: Self-pay | Admitting: Internal Medicine

## 2017-09-20 ENCOUNTER — Encounter (HOSPITAL_COMMUNITY): Payer: Self-pay

## 2017-09-20 LAB — GLUCOSE, CAPILLARY
GLUCOSE-CAPILLARY: 257 mg/dL — AB (ref 65–99)
Glucose-Capillary: 197 mg/dL — ABNORMAL HIGH (ref 65–99)
Glucose-Capillary: 265 mg/dL — ABNORMAL HIGH (ref 65–99)
Glucose-Capillary: 311 mg/dL — ABNORMAL HIGH (ref 65–99)

## 2017-09-20 LAB — PROCALCITONIN: PROCALCITONIN: 0.16 ng/mL

## 2017-09-20 MED ORDER — LEVALBUTEROL HCL 0.63 MG/3ML IN NEBU: 0.6300 mg | INHALATION_SOLUTION | Freq: Two times a day (BID) | RESPIRATORY_TRACT | Status: DC

## 2017-09-20 MED ORDER — IPRATROPIUM BROMIDE 0.02 % IN SOLN
0.5000 mg | Freq: Four times a day (QID) | RESPIRATORY_TRACT | Status: DC | PRN
  Administered 2017-09-20: 0.5 mg via RESPIRATORY_TRACT
  Filled 2017-09-20: qty 2.5

## 2017-09-20 MED ORDER — LEVALBUTEROL HCL 0.63 MG/3ML IN NEBU
0.6300 mg | INHALATION_SOLUTION | Freq: Two times a day (BID) | RESPIRATORY_TRACT | Status: DC
  Administered 2017-09-21 – 2017-09-22 (×3): 0.63 mg via RESPIRATORY_TRACT
  Filled 2017-09-20 (×4): qty 3

## 2017-09-20 MED ORDER — IPRATROPIUM BROMIDE 0.02 % IN SOLN
0.5000 mg | Freq: Two times a day (BID) | RESPIRATORY_TRACT | Status: DC
  Administered 2017-09-20 – 2017-09-22 (×4): 0.5 mg via RESPIRATORY_TRACT
  Filled 2017-09-20 (×4): qty 2.5

## 2017-09-20 MED ORDER — LEVOFLOXACIN 750 MG PO TABS
750.0000 mg | ORAL_TABLET | Freq: Every day | ORAL | Status: DC
  Administered 2017-09-20 – 2017-09-21 (×2): 750 mg via ORAL
  Filled 2017-09-20 (×2): qty 1

## 2017-09-20 MED ORDER — LEVALBUTEROL HCL 0.63 MG/3ML IN NEBU
0.6300 mg | INHALATION_SOLUTION | Freq: Two times a day (BID) | RESPIRATORY_TRACT | Status: DC
  Administered 2017-09-20: 0.63 mg via RESPIRATORY_TRACT
  Filled 2017-09-20: qty 3

## 2017-09-20 NOTE — Progress Notes (Signed)
PROGRESS NOTE    Nicole Melendez  UXN:[id number] DOB: December 26, 1975 DOA: 09/18/2017 PCP: Biagio Borg, MD  Brief Narrative: Nicole Melendez is a 41 y.o. female with medical history significant for for depression, anxiety, type 2 diabetes mellitus, hypertension,  obesity, and pulmonary sarcoidosis, now presenting to the emergency department with 2 days of fevers, aches, dyspnea, and cough productive of thick green sputum.  She is admitted for right lower lobe pneumonia.   Assessment & Plan:   Principal Problem:   CAP (community acquired pneumonia) Active Problems:   PULMONARY SARCOIDOSIS   Diabetes mellitus with neuropathy ([place])   Anxiety state   Depression   Gastroesophageal reflux disease   Essential hypertension   Rheumatoid arthritis ([place])   Chronic diastolic CHF (congestive heart failure) ([place])  Right lower lobe pneumonia/community-acquired pneumonia/sarcoidosis flareup Admitted for IV antibiotics, duo nebs.  Blood cultures and sputum cultures ordered and are pending.  Low-grade fever and mild leukocytosis.  Pro-calcitonin negative lactic acid normalized.  Elevated absolute neutrophil count on differential.  Respiratory panel shows rhinovirus.  Urine negative for strep pneumo antigen.  HIV negative. Continues to have diffuse wheezing and sob hasn't improved , recommend continue with levaquin,  Resume IV steroids, Tessalon.   Type 2 diabetes  CBG (last 3)  Recent Labs    09/20/17 0741 09/20/17 1115 09/20/17 1700  GLUCAP 257* 197* 265*  change to resistance scale. For better control of CBG'S    Hypertension well controlled.  Chronic diastolic heart failure She appears to be compensated.   GERD stable.   DVT prophylaxis: Lovenox Code Status: Full code Family Communication: None at bedside Disposition Plan: (Home in 1-2 days  Consultants:   None   Procedures: None  Antimicrobials: IV Levaquin  Subjective: WHEEZING , SOB.   Objective: Vitals:   09/20/17 0551 09/20/17 0836 09/20/17 1401 09/20/17 1754  BP: 121/71  124/73   Pulse: 73  84   Resp: 20 18 16 18   Temp: 98.2 F (36.8 C)  97.8 F (36.6 C)   TempSrc: Oral  Oral   SpO2: 96% 94% 96% 97%  Weight:      Height:        Intake/Output Summary (Last 24 hours) at 09/20/2017 1803 Last data filed at 09/20/2017 1402 Gross per 24 hour  Intake 600 ml  Output 1200 ml  Net -600 ml   Filed Weights   09/18/17 2221  Weight: (!) 146.5 kg (322 lb 15.6 oz)    Examination:  General exam: Mild distress from shortness of breath Respiratory system: Bilateral anteriorly and posterior wheezing heard, no rhonchi Cardiovascular system: S1 & S2 heard, RRR. No JVD, murmurs, rubs, gallops or clicks. No pedal edema. Gastrointestinal system: Abdomen is soft nontender nondistended, bowel sounds are heard Central nervous system: Alert and oriented. No focal neurological deficits. Extremities: Symmetric 5 x 5 power. Skin: No rashes, lesions or ulcers Psychiatry:Mood & affect appropriate.     Data Reviewed: I have personally reviewed following labs and imaging studies  CBC: Recent Labs  Lab 09/18/17 1847 09/19/17 0502  WBC 12.0* 11.4*  NEUTROABS 10.3* 10.8*  HGB 13.5 12.1  HCT 39.8 35.7*  MCV 86.3 86.4  PLT 299 254   Basic Metabolic Panel: Recent Labs  Lab 09/18/17 1847 09/19/17 0502  NA 141 137  K 3.7 4.3  CL 107 110  CO2 24 21*  GLUCOSE 135* 188*  BUN 8 9  CREATININE 0.82 0.72  CALCIUM 9.6 8.9   GFR: Estimated Creatinine Clearance:  134.8 mL/min (by C-G formula based on SCr of 0.72 mg/dL). Liver Function Tests: No results for input(s): AST, ALT, ALKPHOS, BILITOT, PROT, ALBUMIN in the last 168 hours. No results for input(s): LIPASE, AMYLASE in the last 168 hours. No results for input(s): AMMONIA in the last 168 hours. Coagulation Profile: No results for input(s): INR, PROTIME in the last 168 hours. Cardiac Enzymes: No results for input(s): CKTOTAL, CKMB, CKMBINDEX,  TROPONINI in the last 168 hours. BNP (last 3 results) No results for input(s): PROBNP in the last 8760 hours. HbA1C: No results for input(s): HGBA1C in the last 72 hours. CBG: Recent Labs  Lab 09/19/17 1706 09/19/17 2141 09/20/17 0741 09/20/17 1115 09/20/17 1700  GLUCAP 200* 170* 257* 197* 265*   Lipid Profile: No results for input(s): CHOL, HDL, LDLCALC, TRIG, CHOLHDL, LDLDIRECT in the last 72 hours. Thyroid Function Tests: No results for input(s): TSH, T4TOTAL, FREET4, T3FREE, THYROIDAB in the last 72 hours. Anemia Panel: No results for input(s): VITAMINB12, FOLATE, FERRITIN, TIBC, IRON, RETICCTPCT in the last 72 hours. Sepsis Labs: Recent Labs  Lab 09/18/17 2025 09/18/17 2225 09/19/17 0502 09/20/17 0502  PROCALCITON  --  <0.10 <0.10 0.16  LATICACIDVEN 1.18  --   --   --     Recent Results (from the past 240 hour(s))  Culture, blood (routine x 2)     Status: None (Preliminary result)   Collection Time: 09/18/17  8:18 PM  Result Value Ref Range Status   Specimen Description BLOOD RIGHT HAND  Final   Special Requests   Final    BOTTLES DRAWN AEROBIC AND ANAEROBIC Blood Culture adequate volume   Culture   Final    NO GROWTH 1 DAY Performed at [facility], [place] [address] [place], [place] [address]    Report Status PENDING  Incomplete  Culture, blood (routine x 2)     Status: None (Preliminary result)   Collection Time: 09/18/17  8:36 PM  Result Value Ref Range Status   Specimen Description BLOOD LEFT HAND  Final   Special Requests   Final    BOTTLES DRAWN AEROBIC AND ANAEROBIC Blood Culture adequate volume   Culture   Final    NO GROWTH 1 DAY Performed at [facility], [place] [address], [place], [place] [address]    Report Status PENDING  Incomplete  Respiratory Panel by PCR     Status: Abnormal   Collection Time: 09/18/17 11:34 PM  Result Value Ref Range Status   Adenovirus NOT DETECTED NOT DETECTED Final   Coronavirus 229E NOT DETECTED NOT  DETECTED Final   Coronavirus HKU1 NOT DETECTED NOT DETECTED Final   Coronavirus NL63 NOT DETECTED NOT DETECTED Final   Coronavirus OC43 NOT DETECTED NOT DETECTED Final   Metapneumovirus NOT DETECTED NOT DETECTED Final   Rhinovirus / Enterovirus DETECTED (A) NOT DETECTED Final   Influenza A NOT DETECTED NOT DETECTED Final   Influenza B NOT DETECTED NOT DETECTED Final   Parainfluenza Virus 1 NOT DETECTED NOT DETECTED Final   Parainfluenza Virus 2 NOT DETECTED NOT DETECTED Final   Parainfluenza Virus 3 NOT DETECTED NOT DETECTED Final   Parainfluenza Virus 4 NOT DETECTED NOT DETECTED Final   Respiratory Syncytial Virus NOT DETECTED NOT DETECTED Final   Bordetella pertussis NOT DETECTED NOT DETECTED Final   Chlamydophila pneumoniae NOT DETECTED NOT DETECTED Final   Mycoplasma pneumoniae NOT DETECTED NOT DETECTED Final    Comment: Performed at [facility], [place],  [place] [address]  Culture, sputum-assessment     Status: None   Collection Time: 09/19/17  2:30 AM  Result Value Ref Range Status   Specimen Description EXPECTORATED SPUTUM  Final   Special Requests Immunocompromised  Final   Sputum evaluation THIS SPECIMEN IS ACCEPTABLE FOR SPUTUM CULTURE  Final   Report Status 09/19/2017 FINAL  Final  Culture, respiratory (NON-Expectorated)     Status: None (Preliminary result)   Collection Time: 09/19/17  2:30 AM  Result Value Ref Range Status   Specimen Description EXPECTORATED SPUTUM  Final   Special Requests Immunocompromised Reflexed from [id number]  Final   Gram Stain   Final    FEW WBC PRESENT, PREDOMINANTLY PMN RARE SQUAMOUS EPITHELIAL CELLS PRESENT RARE GRAM POSITIVE COCCI IN PAIRS RARE GRAM POSITIVE RODS    Culture   Final    CULTURE REINCUBATED FOR BETTER GROWTH Performed at [facility], [place] [address], [place], [place] [address]    Report Status PENDING  Incomplete         Radiology Studies: No results found.      Scheduled Meds: .  cholecalciferol  1,000 Units Oral Daily  . enoxaparin (LOVENOX) injection  0.5 mg/kg Subcutaneous QHS  . folic acid  2 mg Oral Daily  . furosemide  40 mg Oral Daily  . gabapentin  300 mg Oral TID  . insulin aspart  0-5 Units Subcutaneous QHS  . insulin aspart  0-9 Units Subcutaneous TID WC  . [START ON 09/21/2017] ipratropium  0.5 mg Nebulization BID  . irbesartan  150 mg Oral Daily  . [START ON 09/21/2017] levalbuterol  0.63 mg Nebulization BID  . levofloxacin  750 mg Oral QHS  . loratadine  10 mg Oral Daily  . methylPREDNISolone (SOLU-MEDROL) injection  40 mg Intravenous Q8H  . mometasone-formoterol  2 puff Inhalation BID  . potassium chloride SA  10 mEq Oral Daily  . potassium chloride SA  20 mEq Oral QHS  . rosuvastatin  20 mg Oral q1800  . sertraline  200 mg Oral Daily  . sodium chloride flush  3 mL Intravenous Q12H  . vitamin B-12  1,000 mcg Oral Daily  . zonisamide  100 mg Oral Daily   Continuous Infusions: . sodium chloride       LOS: 2 days    Time spent: 35 min    Hosie Poisson, MD [facility] Pager [phone]  If 7PM-7AM, please contact night-coverage [email or web address] Password [facility] 09/20/2017, 6:03 PM

## 2017-09-21 DIAGNOSIS — Z794 Long term (current) use of insulin: Secondary | ICD-10-CM

## 2017-09-21 DIAGNOSIS — K219 Gastro-esophageal reflux disease without esophagitis: Secondary | ICD-10-CM

## 2017-09-21 DIAGNOSIS — D869 Sarcoidosis, unspecified: Secondary | ICD-10-CM

## 2017-09-21 DIAGNOSIS — E1140 Type 2 diabetes mellitus with diabetic neuropathy, unspecified: Secondary | ICD-10-CM

## 2017-09-21 DIAGNOSIS — I5032 Chronic diastolic (congestive) heart failure: Secondary | ICD-10-CM

## 2017-09-21 DIAGNOSIS — J181 Lobar pneumonia, unspecified organism: Principal | ICD-10-CM

## 2017-09-21 DIAGNOSIS — I10 Essential (primary) hypertension: Secondary | ICD-10-CM

## 2017-09-21 DIAGNOSIS — F329 Major depressive disorder, single episode, unspecified: Secondary | ICD-10-CM

## 2017-09-21 LAB — CULTURE, RESPIRATORY: CULTURE: NORMAL

## 2017-09-21 LAB — GLUCOSE, CAPILLARY
GLUCOSE-CAPILLARY: 205 mg/dL — AB (ref 65–99)
Glucose-Capillary: 198 mg/dL — ABNORMAL HIGH (ref 65–99)
Glucose-Capillary: 201 mg/dL — ABNORMAL HIGH (ref 65–99)
Glucose-Capillary: 213 mg/dL — ABNORMAL HIGH (ref 65–99)

## 2017-09-21 LAB — CULTURE, RESPIRATORY W GRAM STAIN

## 2017-09-21 MED ORDER — NYSTATIN 100000 UNIT/ML MT SUSP
5.0000 mL | Freq: Four times a day (QID) | OROMUCOSAL | Status: DC
  Administered 2017-09-21 – 2017-09-22 (×5): 500000 [IU] via ORAL
  Filled 2017-09-21 (×5): qty 5

## 2017-09-21 MED ORDER — METHYLPREDNISOLONE SODIUM SUCC 40 MG IJ SOLR
40.0000 mg | Freq: Two times a day (BID) | INTRAMUSCULAR | Status: DC
  Administered 2017-09-22: 40 mg via INTRAVENOUS
  Filled 2017-09-21: qty 1

## 2017-09-21 MED ORDER — PANTOPRAZOLE SODIUM 40 MG PO TBEC
40.0000 mg | DELAYED_RELEASE_TABLET | Freq: Every day | ORAL | Status: DC
  Administered 2017-09-21 – 2017-09-22 (×2): 40 mg via ORAL
  Filled 2017-09-21 (×2): qty 1

## 2017-09-21 MED ORDER — ARFORMOTEROL TARTRATE 15 MCG/2ML IN NEBU
15.0000 ug | INHALATION_SOLUTION | Freq: Two times a day (BID) | RESPIRATORY_TRACT | Status: DC
  Administered 2017-09-21 – 2017-09-22 (×3): 15 ug via RESPIRATORY_TRACT
  Filled 2017-09-21 (×3): qty 2

## 2017-09-21 MED ORDER — FUROSEMIDE 10 MG/ML IJ SOLN
40.0000 mg | Freq: Once | INTRAMUSCULAR | Status: AC
  Administered 2017-09-21: 40 mg via INTRAVENOUS
  Filled 2017-09-21: qty 4

## 2017-09-21 MED ORDER — INSULIN ASPART 100 UNIT/ML ~~LOC~~ SOLN
5.0000 [IU] | Freq: Three times a day (TID) | SUBCUTANEOUS | Status: DC
  Administered 2017-09-21 – 2017-09-22 (×3): 5 [IU] via SUBCUTANEOUS

## 2017-09-21 MED ORDER — INSULIN GLARGINE 100 UNIT/ML ~~LOC~~ SOLN
8.0000 [IU] | Freq: Every day | SUBCUTANEOUS | Status: DC
Start: 2017-09-21 — End: 2017-09-21
  Filled 2017-09-21: qty 0.08

## 2017-09-21 MED ORDER — FLUTICASONE PROPIONATE 50 MCG/ACT NA SUSP
2.0000 | Freq: Every day | NASAL | Status: DC
  Administered 2017-09-21 – 2017-09-22 (×2): 2 via NASAL
  Filled 2017-09-21: qty 16

## 2017-09-21 MED ORDER — IPRATROPIUM BROMIDE 0.02 % IN SOLN: 0.5000 mg | Freq: Four times a day (QID) | RESPIRATORY_TRACT | Status: DC | PRN

## 2017-09-21 MED ORDER — BUDESONIDE 0.5 MG/2ML IN SUSP
0.5000 mg | Freq: Two times a day (BID) | RESPIRATORY_TRACT | Status: DC
  Administered 2017-09-21 – 2017-09-22 (×3): 0.5 mg via RESPIRATORY_TRACT
  Filled 2017-09-21 (×2): qty 2

## 2017-09-21 MED ORDER — LEVALBUTEROL HCL 0.63 MG/3ML IN NEBU: 0.6300 mg | INHALATION_SOLUTION | Freq: Four times a day (QID) | RESPIRATORY_TRACT | Status: DC | PRN

## 2017-09-21 MED ORDER — INSULIN GLARGINE 100 UNIT/ML ~~LOC~~ SOLN
10.0000 [IU] | Freq: Every day | SUBCUTANEOUS | Status: DC
  Administered 2017-09-21: 10 [IU] via SUBCUTANEOUS
  Filled 2017-09-21 (×2): qty 0.1

## 2017-09-21 NOTE — Progress Notes (Signed)
Inpatient Diabetes Program Recommendations  AACE/ADA: New Consensus Statement on Inpatient Glycemic Control (2015)  Target Ranges:  Prepandial:   less than 140 mg/dL      Peak postprandial:   less than 180 mg/dL (1-2 hours)      Critically ill patients:  140 - 180 mg/dL   Results for Nicole Melendez, Nicole Melendez (MRN [id number]) as of 09/21/2017 12:44  Ref. Range 09/20/2017 07:41 09/20/2017 11:15 09/20/2017 17:00 09/20/2017 21:46  Glucose-Capillary Latest Ref Range: 65 - 99 mg/dL 257 (H) 197 (H) 265 (H) 311 (H)   Results for Nicole Melendez, Nicole Melendez (MRN [id number]) as of 09/21/2017 12:44  Ref. Range 09/21/2017 08:21 09/21/2017 12:15  Glucose-Capillary Latest Ref Range: 65 - 99 mg/dL 198 (H) 201 (H)   Admit with: Pneumonia  History: DM  Home DM Meds: Humalog SSI  Current Insulin Orders: Novolog Sensitive Correction Scale/ SSI (0-9 units) TID AC + HS       MD- Note patient getting Solumedrol 40 mg Q8 hours.  CBGs elevated >200 mg/dl.  Please consider the following in-hospital insulin adjustments:  1. Start Lantus 8 units daily (0.05 units/kg dosing)  2. Start Novolog Meal Coverage: Novolog 4 units TID with meals (hold if pt eats <50% of meal)       --Will follow patient during hospitalization--  Wyn Quaker RN, MSN, CDE Diabetes Coordinator Inpatient Glycemic Control Team Team Pager: [phone] (8a-5p)

## 2017-09-21 NOTE — Progress Notes (Signed)
PROGRESS NOTE    Nicole Melendez  QMV:[id number] DOB: [birth year] DOA: 09/18/2017 PCP: Biagio Borg, MD    Brief Narrative:  Nicole Melendez a 41 y.o.femalewith medical history significant forfor depression, anxiety, type 2 diabetes mellitus, hypertension, obesity, and pulmonary sarcoidosis, now presenting to the emergency department with 2 days of fevers, aches, dyspnea, and cough productive of thick green sputum.  She is admitted for right lower lobe pneumonia     Assessment & Plan:   Principal Problem:   CAP (community acquired pneumonia) Active Problems:   PULMONARY SARCOIDOSIS   Diabetes mellitus with neuropathy ([place])   Anxiety state   Depression   Gastroesophageal reflux disease   Essential hypertension   Rheumatoid arthritis (HCC)   Chronic diastolic CHF (congestive heart failure) ([place])  #1 probable right lower lobe pneumonia/committee acquired pneumonia in the setting of sarcoidosis Patient admitted to the right lower note pneumonia in the setting of cycled doses with concern for possible sarcoidosis flare. Patient with clinical improvement on IV antibiotics and scheduled nebs. Blood cultures were no growth to date. Sputum cultures with normal respiratory flora. Respiratory viral panel positive for rhinovirus. Patient noted yesterday to have significant wheezing and subsequently placed on IV Solu-Medrol. Will place on Pulmicort and Brovana. Discontinue dulera. Continue Xopenex and Atrovent nebs. Placed on Claritin and Flonase. Will give a dose of Lasix 40 mg IV 1. Continue Levaquin. Follow. Post discharge will likely need close follow-up with pulmonologist.  #2 diabetes mellitus with neuropathy Hemoglobin A1c was 6.1 on 06/15/2017. CBGs ranging from 198-311. Increasing CBGs likely due to IV steroids. Place on Lantus 10 units daily. Place a meal coverage NovoLog 5 units 3 times daily with meals. Monitor CBGs as steroids are narrowed down. Continue Neurontin.  Follow.  #3 hypertension Blood pressure borderline. Monitor closely on Avapro. Follow.  #4 chronic diastolic CHF Stable. Continue home regimen Lasix.  #5 hyperlipidemia Continue Crestor.  #6 depression Stable. Continue Zoloft.  #7 ?? Oral thrush Patient complaining of beginning signs of oral thrush which she notices whenever she is on steroids. We'll place on nystatin swish and swallow. Follow.  #8 gastroesophageal reflux disease PPI.   DVT prophylaxis: Lovenox Code Status: Full Family Communication: Updated patient. No family at bedside. Disposition Plan: Home when clinically improved and close to baseline.   Consultants:   None  Procedures:   Chest x-ray 09/18/2017    Antimicrobials:   Oral Levaquin 09/20/2017>>>>> 09/23/2017  IV Levaquin 09/19/2017>>>>> 09/20/2017   Subjective: Patient states some improvement with shortness of breath since admission however not at baseline. Patient states on minimal exertion with shortness of breath. Denies any coughing. Patient concerned her blood sugars arising due to IV steroids.  Objective: Vitals:   09/21/17 0500 09/21/17 0827 09/21/17 0830 09/21/17 1000  BP: 132/81     Pulse: 72     Resp: 20     Temp: 98 F (36.7 C)     TempSrc: Oral     SpO2: 98% 96% 96%   Weight:    (!) 147.4 kg (324 lb 14.4 oz)  Height:    5\' 6"  (1.676 m)    Intake/Output Summary (Last 24 hours) at 09/21/2017 1320 Last data filed at 09/21/2017 0700 Gross per 24 hour  Intake 723 ml  Output 800 ml  Net -77 ml   Filed Weights   09/18/17 2221 09/21/17 1000  Weight: (!) 146.5 kg (322 lb 15.6 oz) (!) 147.4 kg (324 lb 14.4 oz)    Examination:  General exam: Appears calm and comfortable  Respiratory system: Patient with minimal-mild wheezing. No crackles. No rhonchi. Respiratory effort normal. Cardiovascular system: S1 & S2 heard, RRR. No JVD, murmurs, rubs, gallops or clicks. No pedal edema. Gastrointestinal system: Abdomen is  nondistended, soft and nontender. No organomegaly or masses felt. Normal bowel sounds heard. Central nervous system: Alert and oriented. No focal neurological deficits. Extremities: Symmetric 5 x 5 power. Skin: No rashes, lesions or ulcers Psychiatry: Judgement and insight appear normal. Mood & affect appropriate.     Data Reviewed: I have personally reviewed following labs and imaging studies  CBC: Recent Labs  Lab 09/18/17 1847 09/19/17 0502  WBC 12.0* 11.4*  NEUTROABS 10.3* 10.8*  HGB 13.5 12.1  HCT 39.8 35.7*  MCV 86.3 86.4  PLT 299 734   Basic Metabolic Panel: Recent Labs  Lab 09/18/17 1847 09/19/17 0502  NA 141 137  K 3.7 4.3  CL 107 110  CO2 24 21*  GLUCOSE 135* 188*  BUN 8 9  CREATININE 0.82 0.72  CALCIUM 9.6 8.9   GFR: Estimated Creatinine Clearance: 135.3 mL/min (by C-G formula based on SCr of 0.72 mg/dL). Liver Function Tests: No results for input(s): AST, ALT, ALKPHOS, BILITOT, PROT, ALBUMIN in the last 168 hours. No results for input(s): LIPASE, AMYLASE in the last 168 hours. No results for input(s): AMMONIA in the last 168 hours. Coagulation Profile: No results for input(s): INR, PROTIME in the last 168 hours. Cardiac Enzymes: No results for input(s): CKTOTAL, CKMB, CKMBINDEX, TROPONINI in the last 168 hours. BNP (last 3 results) No results for input(s): PROBNP in the last 8760 hours. HbA1C: No results for input(s): HGBA1C in the last 72 hours. CBG: Recent Labs  Lab 09/20/17 1115 09/20/17 1700 09/20/17 2146 09/21/17 0821 09/21/17 1215  GLUCAP 197* 265* 311* 198* 201*   Lipid Profile: No results for input(s): CHOL, HDL, LDLCALC, TRIG, CHOLHDL, LDLDIRECT in the last 72 hours. Thyroid Function Tests: No results for input(s): TSH, T4TOTAL, FREET4, T3FREE, THYROIDAB in the last 72 hours. Anemia Panel: No results for input(s): VITAMINB12, FOLATE, FERRITIN, TIBC, IRON, RETICCTPCT in the last 72 hours. Sepsis Labs: Recent Labs  Lab  09/18/17 2025 09/18/17 2225 09/19/17 0502 09/20/17 0502  PROCALCITON  --  <0.10 <0.10 0.16  LATICACIDVEN 1.18  --   --   --     Recent Results (from the past 240 hour(s))  Culture, blood (routine x 2)     Status: None (Preliminary result)   Collection Time: 09/18/17  8:18 PM  Result Value Ref Range Status   Specimen Description BLOOD RIGHT HAND  Final   Special Requests   Final    BOTTLES DRAWN AEROBIC AND ANAEROBIC Blood Culture adequate volume   Culture   Final    NO GROWTH 1 DAY Performed at [facility], [place] [address], [place], [place] [address]    Report Status PENDING  Incomplete  Culture, blood (routine x 2)     Status: None (Preliminary result)   Collection Time: 09/18/17  8:36 PM  Result Value Ref Range Status   Specimen Description BLOOD LEFT HAND  Final   Special Requests   Final    BOTTLES DRAWN AEROBIC AND ANAEROBIC Blood Culture adequate volume   Culture   Final    NO GROWTH 1 DAY Performed at [facility], [place] [address], [place], [place] [address]    Report Status PENDING  Incomplete  Respiratory Panel by PCR     Status:  Abnormal   Collection Time: 09/18/17 11:34 PM  Result Value Ref Range Status   Adenovirus NOT DETECTED NOT DETECTED Final   Coronavirus 229E NOT DETECTED NOT DETECTED Final   Coronavirus HKU1 NOT DETECTED NOT DETECTED Final   Coronavirus NL63 NOT DETECTED NOT DETECTED Final   Coronavirus OC43 NOT DETECTED NOT DETECTED Final   Metapneumovirus NOT DETECTED NOT DETECTED Final   Rhinovirus / Enterovirus DETECTED (A) NOT DETECTED Final   Influenza A NOT DETECTED NOT DETECTED Final   Influenza B NOT DETECTED NOT DETECTED Final   Parainfluenza Virus 1 NOT DETECTED NOT DETECTED Final   Parainfluenza Virus 2 NOT DETECTED NOT DETECTED Final   Parainfluenza Virus 3 NOT DETECTED NOT DETECTED Final   Parainfluenza Virus 4 NOT DETECTED NOT DETECTED Final   Respiratory Syncytial Virus NOT DETECTED NOT DETECTED Final   Bordetella  pertussis NOT DETECTED NOT DETECTED Final   Chlamydophila pneumoniae NOT DETECTED NOT DETECTED Final   Mycoplasma pneumoniae NOT DETECTED NOT DETECTED Final    Comment: Performed at [facility], [place] [address] [place], [place] [address]  Culture, sputum-assessment     Status: None   Collection Time: 09/19/17  2:30 AM  Result Value Ref Range Status   Specimen Description EXPECTORATED SPUTUM  Final   Special Requests Immunocompromised  Final   Sputum evaluation THIS SPECIMEN IS ACCEPTABLE FOR SPUTUM CULTURE  Final   Report Status 09/19/2017 FINAL  Final  Culture, respiratory (NON-Expectorated)     Status: None   Collection Time: 09/19/17  2:30 AM  Result Value Ref Range Status   Specimen Description EXPECTORATED SPUTUM  Final   Special Requests Immunocompromised Reflexed from [id number]  Final   Gram Stain   Final    FEW WBC PRESENT, PREDOMINANTLY PMN RARE SQUAMOUS EPITHELIAL CELLS PRESENT RARE GRAM POSITIVE COCCI IN PAIRS RARE GRAM POSITIVE RODS    Culture   Final    Consistent with normal respiratory flora. Performed at [facility], [place] [address], [place], [place] [address]    Report Status 09/21/2017 FINAL  Final         Radiology Studies: No results found.      Scheduled Meds: . arformoterol  15 mcg Nebulization BID  . budesonide (PULMICORT) nebulizer solution  0.5 mg Nebulization BID  . cholecalciferol  1,000 Units Oral Daily  . enoxaparin (LOVENOX) injection  0.5 mg/kg Subcutaneous QHS  . fluticasone  2 spray Each Nare Daily  . folic acid  2 mg Oral Daily  . furosemide  40 mg Oral Daily  . gabapentin  300 mg Oral TID  . insulin aspart  0-5 Units Subcutaneous QHS  . insulin aspart  0-9 Units Subcutaneous TID WC  . insulin aspart  5 Units Subcutaneous TID WC  . insulin glargine  8 Units Subcutaneous QHS  . ipratropium  0.5 mg Nebulization BID  . irbesartan  150 mg Oral Daily  . levalbuterol  0.63 mg Nebulization BID  . levofloxacin  750 mg Oral  QHS  . loratadine  10 mg Oral Daily  . methylPREDNISolone (SOLU-MEDROL) injection  40 mg Intravenous Q8H  . pantoprazole  40 mg Oral Daily  . potassium chloride SA  10 mEq Oral Daily  . potassium chloride SA  20 mEq Oral QHS  . rosuvastatin  20 mg Oral q1800  . sertraline  200 mg Oral Daily  . sodium chloride flush  3 mL Intravenous Q12H  . vitamin B-12  1,000 mcg Oral Daily  .  zonisamide  100 mg Oral Daily   Continuous Infusions: . sodium chloride       LOS: 3 days    Time spent: 35 mins    Irine Seal, MD [facility] Pager [phone]  If 7PM-7AM, please contact night-coverage [email or web address] Password [email or web address] 09/21/2017, 1:20 PM

## 2017-09-21 NOTE — Care Management Note (Signed)
Case Management Note  Patient Details  Name: NELVA HAUK MRN: [id number] Date of Birth: 03-Mar-1976  Subjective/Objective: 41 y/o f admitted w/PNA, Pulmonary sarcoidosis. From home.                   Action/Plan:d/c plan home.   Expected Discharge Date:                  Expected Discharge Plan:  Home/Self Care  In-House Referral:     Discharge planning Services  CM Consult  Post Acute Care Choice:    Choice offered to:     DME Arranged:    DME Agency:     HH Arranged:    HH Agency:     Status of Service:  In process, will continue to follow  If discussed at Long Length of Stay Meetings, dates discussed:    Additional Comments:  Dessa Phi, RN 09/21/2017, 3:11 PM

## 2017-09-22 ENCOUNTER — Other Ambulatory Visit: Payer: Self-pay | Admitting: Internal Medicine

## 2017-09-22 DIAGNOSIS — M069 Rheumatoid arthritis, unspecified: Secondary | ICD-10-CM

## 2017-09-22 DIAGNOSIS — I5033 Acute on chronic diastolic (congestive) heart failure: Secondary | ICD-10-CM | POA: Clinically undetermined

## 2017-09-22 DIAGNOSIS — F411 Generalized anxiety disorder: Secondary | ICD-10-CM

## 2017-09-22 LAB — CBC WITH DIFFERENTIAL/PLATELET
Basophils Absolute: 0 10*3/uL (ref 0.0–0.1)
Basophils Relative: 0 %
EOS ABS: 0 10*3/uL (ref 0.0–0.7)
Eosinophils Relative: 0 %
HEMATOCRIT: 36.4 % (ref 36.0–46.0)
HEMOGLOBIN: 12.3 g/dL (ref 12.0–15.0)
Lymphocytes Relative: 7 %
Lymphs Abs: 1 10*3/uL (ref 0.7–4.0)
MCH: 29 pg (ref 26.0–34.0)
MCHC: 33.8 g/dL (ref 30.0–36.0)
MCV: 85.8 fL (ref 78.0–100.0)
MONO ABS: 0.6 10*3/uL (ref 0.1–1.0)
MONOS PCT: 4 %
NEUTROS ABS: 13.1 10*3/uL — AB (ref 1.7–7.7)
NEUTROS PCT: 89 %
Platelets: 261 10*3/uL (ref 150–400)
RBC: 4.24 MIL/uL (ref 3.87–5.11)
RDW: 14 % (ref 11.5–15.5)
WBC: 14.7 10*3/uL — ABNORMAL HIGH (ref 4.0–10.5)

## 2017-09-22 LAB — BASIC METABOLIC PANEL
Anion gap: 8 (ref 5–15)
BUN: 19 mg/dL (ref 6–20)
CHLORIDE: 104 mmol/L (ref 101–111)
CO2: 27 mmol/L (ref 22–32)
CREATININE: 0.76 mg/dL (ref 0.44–1.00)
Calcium: 9.2 mg/dL (ref 8.9–10.3)
GFR calc Af Amer: >60 mL/min (ref >60)
GFR calc non Af Amer: >60 mL/min (ref >60)
GLUCOSE: 194 mg/dL — AB (ref 65–99)
POTASSIUM: 4 mmol/L (ref 3.5–5.1)
SODIUM: 139 mmol/L (ref 135–145)

## 2017-09-22 LAB — GLUCOSE, CAPILLARY
GLUCOSE-CAPILLARY: 208 mg/dL — AB (ref 65–99)
Glucose-Capillary: 204 mg/dL — ABNORMAL HIGH (ref 65–99)

## 2017-09-22 MED ORDER — PREDNISONE 20 MG PO TABS: 20.0000 mg | ORAL_TABLET | Freq: Every day | ORAL | 0 refills | Status: DC

## 2017-09-22 MED ORDER — CYCLOBENZAPRINE HCL 5 MG PO TABS: 10.0000 mg | ORAL_TABLET | Freq: Every evening | ORAL | Status: DC | PRN

## 2017-09-22 MED ORDER — FUROSEMIDE 40 MG PO TABS: 40.0000 mg | ORAL_TABLET | Freq: Every day | ORAL | Status: DC

## 2017-09-22 MED ORDER — FLUTICASONE PROPIONATE 50 MCG/ACT NA SUSP: 2.0000 | Freq: Every day | NASAL | 0 refills | Status: DC

## 2017-09-22 MED ORDER — FLUCONAZOLE 150 MG PO TABS: 150.0000 mg | ORAL_TABLET | Freq: Once | ORAL | 0 refills | Status: AC

## 2017-09-22 MED ORDER — LEVALBUTEROL TARTRATE 45 MCG/ACT IN AERO: 1.0000 | INHALATION_SPRAY | Freq: Three times a day (TID) | RESPIRATORY_TRACT | 0 refills | Status: DC | PRN

## 2017-09-22 MED ORDER — GUAIFENESIN ER 600 MG PO TB12: 1200.0000 mg | ORAL_TABLET | Freq: Two times a day (BID) | ORAL | 0 refills | Status: DC

## 2017-09-22 MED ORDER — LEVOFLOXACIN 750 MG PO TABS: 750.0000 mg | ORAL_TABLET | Freq: Every day | ORAL | 0 refills | Status: DC

## 2017-09-22 MED ORDER — PREDNISONE 20 MG PO TABS
60.0000 mg | ORAL_TABLET | Freq: Every day | ORAL | Status: DC
  Administered 2017-09-22: 60 mg via ORAL
  Filled 2017-09-22: qty 3

## 2017-09-22 MED ORDER — FUROSEMIDE 10 MG/ML IJ SOLN
40.0000 mg | Freq: Once | INTRAMUSCULAR | Status: AC
  Administered 2017-09-22: 40 mg via INTRAVENOUS
  Filled 2017-09-22: qty 4

## 2017-09-22 MED ORDER — NYSTATIN 100000 UNIT/ML MT SUSP: 5.0000 mL | Freq: Four times a day (QID) | OROMUCOSAL | 0 refills | Status: AC

## 2017-09-22 MED ORDER — BENZONATATE 200 MG PO CAPS: 200.0000 mg | ORAL_CAPSULE | Freq: Three times a day (TID) | ORAL | 0 refills | Status: DC | PRN

## 2017-09-22 NOTE — Progress Notes (Signed)
SATURATION QUALIFICATIONS: (This note is used to comply with regulatory documentation for home oxygen)  Patient Saturations on Room Air at Rest = 98%  Patient Saturations on Room Air while Ambulating = 97%  Patient Saturations on 0 Liters of oxygen while Ambulating = %  Please briefly explain why patient needs home oxygen:

## 2017-09-22 NOTE — Discharge Summary (Addendum)
Physician Discharge Summary  Nicole Melendez EVO:[id number] DOB: 02-05-1976 DOA: 09/18/2017  PCP: Nicole Borg, MD  Admit date: 09/18/2017 Discharge date: 09/22/2017  Time spent: 65 minutes  Recommendations for Outpatient Follow-up:  1. Follow-up with Nicole Borg, MD in 1-2 weeks.  On Follow-up patient will need a basic metabolic profile done to follow-up on electrolytes and renal function.  CBC to follow-up on H&H. 2. Follow-up with Dr. Lamonte Melendez, Pulmoary in 2 weeks.   Discharge Diagnoses:  Principal Problem:   CAP (community acquired pneumonia) Active Problems:   PULMONARY SARCOIDOSIS   Diabetes mellitus with neuropathy ([place])   Anxiety state   Depression   Gastroesophageal reflux disease   Essential hypertension   Rheumatoid arthritis ([place])   Chronic diastolic CHF (congestive heart failure) (HCC)   Acute on chronic diastolic CHF (congestive heart failure) ([place])   Discharge Condition: Stable and improved  Diet recommendation: Heart healthy, 1500-2000 cc/day.  Filed Weights   09/18/17 2221 09/21/17 1000 09/22/17 0526  Weight: (!) 146.5 kg (322 lb 15.6 oz) (!) 147.4 kg (324 lb 14.4 oz) (!) 145.2 kg (320 lb 1.7 oz)    History of present illness:  Per Dr. Koleen Melendez is a 41 y.o. female with medical history significant for for depression, anxiety, type 2 diabetes mellitus, hypertension,  obesity, and pulmonary sarcoidosis, now presented to the emergency department with 2 days of fevers, aches, dyspnea, and cough productive of thick green sputum.  Patient reported that she had been in her usual state of health until approximately 2 days ago when she developed fevers up to 102 degrees F with generalized body aches, dyspnea, and productive cough.  Symptoms have progressively worsened despite her use of inhalers and antipyretics at home.  She now reported dyspnea at rest, prompting her presentation to the ED. No rhinorrhea or sore throat, and no sick contacts.   ED Course:  Upon arrival to the ED, patient was found to have a temp of 37.7 degree C, saturating adequately on room air, but the tachypnea, tachycardic, and with stable blood pressure.  Chest x-ray was notable for right basilar opacity, atelectasis versus infiltrate.  Chemistry panel was unremarkable and CBC notable for leukocytosis to 12,000.  Lactic acid is reassuringly normal.  Patient was given a liter of normal saline in the ED, treated with DuoNeb and 2 albuterol treatments.  Blood cultures were collected and she was given a dose of IV Levaquin.  She remained hemodynamically stable, but continued to be dyspneic at rest and admitted to the medical-surgical unit for ongoing evaluation and management of community-acquired pneumonia    Hospital Course:  #1 probable right lower lobe pneumonia/committee acquired pneumonia in the setting of sarcoidosis Patient admitted to the right lower note pneumonia in the setting of cycled doses with concern for possible sarcoidosis flare. Patient with clinical improvement on IV antibiotics and scheduled nebs. Blood cultures were no growth to date. Sputum cultures with normal respiratory flora. Respiratory viral panel positive for rhinovirus. Patient noted 2 days prior to admission, to have significant wheezing and subsequently placed on IV Solu-Medrol. Pulmicort and Brovana were added to patient's regimen and Dulera discontinued.  Patient was maintained on scheduled Xopenex and Atrovent nebs. Placed on Claritin and Flonase.  She was subsequently placed on IV Lasix with a urine output of 4 L over the past 24 hours and output of -5.943 over the past 24 hours.  Patient improved clinically.  She subsequently transitioned to oral Lasix and oral Levaquin.  Patient will be discharged in stable and improved condition.   #2 diabetes mellitus with neuropathy Hemoglobin A1c was 6.1 on 06/15/2017. CBGs ranging from 198-311. Increasing CBGs likely due to IV steroids. Placed on Lantus 10 units  daily and meal coverage NovoLog 5 units 3 times daily with meals.  Patient continued on home regimen of Neurontin.  Outpatient follow-up.    #3 hypertension Blood pressure borderline. Monitor closely on Avapro. Follow.  #4 Acute on chronic diastolic CHF Patient noted to be hypoxic during the hospitalization and initially felt to be likely secondary to volume overload.  Showed a urine output of 4 L over the past 24 hours.  Patient was -5.943 L during this hospitalization.  Patient improved clinically and transitioned back to home regimen of Lasix 40 mg daily.  Outpatient follow-up.  #5 hyperlipidemia Continued on home regimen of Crestor.  #6 depression Stable. Continued on home regiemn Zoloft.  #7 ?? Oral thrush Patient complained of beginning signs of oral thrush which she notices whenever she is on steroids. We'll place on nystatin swish and swallow. Follow.  #8 gastroesophageal reflux disease Patient maintained on PPI.     Procedures:  Chest x-ray 09/18/2017      Consultations:  None  Discharge Exam: Vitals:   09/22/17 0810 09/22/17 1500  BP:  140/80  Pulse:  70  Resp:  20  Temp:  98 F (36.7 C)  SpO2: 96% 95%    General: NAD Cardiovascular: Regular rate and rhythm no murmurs rubs or gallops. Respiratory: Normal expiratory wheezing.  Some scattered coarse breath sounds otherwise clear.  Discharge Instructions   Discharge Instructions    Diet - low sodium heart healthy   Complete by:  As directed    Fluid restriction 1500-2047ml/day   Increase activity slowly   Complete by:  As directed       Allergies  Allergen Reactions  . Azithromycin Hives    (z pak) hives  . Bee Venom Anaphylaxis  . Flagyl [Metronidazole] Hives and Shortness Of Breath  . Penicillins Shortness Of Breath and Swelling    Has patient had a PCN reaction causing immediate rash, facial/tongue/throat swelling, SOB or lightheadedness with hypotension: Yes Has patient had a PCN  reaction causing severe rash involving mucus membranes or skin necrosis: No Has patient had a PCN reaction that required hospitalization No Has patient had a PCN reaction occurring within the last 10 years: No If all of the above answers are "NO", then may proceed with Cephalosporin use.  REACTION: swelling and difficulty breathing  . Shellfish Allergy Anaphylaxis  . Klonopin [Clonazepam] Other (See Comments)    Memory difficulty   Follow-up Information    Nicole Borg, MD. Schedule an appointment as soon as possible for a visit in 1 week(s).   Specialties:  Internal Medicine, Radiology Why:  f/u in 1-2 weeks. Contact information: [place] [address] [phone]        Collene Gobble, MD. Schedule an appointment as soon as possible for a visit in 2 week(s).   Specialty:  Pulmonary Disease Contact information: [address] [place] [address] [phone]            The results of significant diagnostics from this hospitalization (including imaging, microbiology, ancillary and laboratory) are listed below for reference.    Significant Diagnostic Studies: Dg Chest 2 View  Result Date: 09/18/2017 CLINICAL DATA:  41 year old female with cough, congestion, shortness of breath. History of sarcoidosis. EXAM:  CHEST  2 VIEW COMPARISON:  11/21/2015 FINDINGS: The heart size and mediastinal contours are within normal limits. There is prominence of the right hilum, similar to examination. Vague right basilar opacities suggesting atelectasis versus early infiltrate. The left lung is clear. No sizeable effusions or pneumothorax. The visualized skeletal structures are unremarkable. IMPRESSION: Right basilar atelectasis versus early infiltrate. Follow-up to resolution recommended. Electronically Signed   By: Kristopher Oppenheim M.D.   On: 09/18/2017 18:08    Microbiology: Recent Results (from the past 240 hour(s))  Culture, blood (routine x 2)     Status: None  (Preliminary result)   Collection Time: 09/18/17  8:18 PM  Result Value Ref Range Status   Specimen Description BLOOD RIGHT HAND  Final   Special Requests   Final    BOTTLES DRAWN AEROBIC AND ANAEROBIC Blood Culture adequate volume   Culture   Final    NO GROWTH 3 DAYS Performed at [facility], [place] [address], [place], [place] [address]    Report Status PENDING  Incomplete  Culture, blood (routine x 2)     Status: None (Preliminary result)   Collection Time: 09/18/17  8:36 PM  Result Value Ref Range Status   Specimen Description BLOOD LEFT HAND  Final   Special Requests   Final    BOTTLES DRAWN AEROBIC AND ANAEROBIC Blood Culture adequate volume   Culture   Final    NO GROWTH 3 DAYS Performed at [facility], [place] [address], [place], [place] [address]    Report Status PENDING  Incomplete  Respiratory Panel by PCR     Status: Abnormal   Collection Time: 09/18/17 11:34 PM  Result Value Ref Range Status   Adenovirus NOT DETECTED NOT DETECTED Final   Coronavirus 229E NOT DETECTED NOT DETECTED Final   Coronavirus HKU1 NOT DETECTED NOT DETECTED Final   Coronavirus NL63 NOT DETECTED NOT DETECTED Final   Coronavirus OC43 NOT DETECTED NOT DETECTED Final   Metapneumovirus NOT DETECTED NOT DETECTED Final   Rhinovirus / Enterovirus DETECTED (A) NOT DETECTED Final   Influenza A NOT DETECTED NOT DETECTED Final   Influenza B NOT DETECTED NOT DETECTED Final   Parainfluenza Virus 1 NOT DETECTED NOT DETECTED Final   Parainfluenza Virus 2 NOT DETECTED NOT DETECTED Final   Parainfluenza Virus 3 NOT DETECTED NOT DETECTED Final   Parainfluenza Virus 4 NOT DETECTED NOT DETECTED Final   Respiratory Syncytial Virus NOT DETECTED NOT DETECTED Final   Bordetella pertussis NOT DETECTED NOT DETECTED Final   Chlamydophila pneumoniae NOT DETECTED NOT DETECTED Final   Mycoplasma pneumoniae NOT DETECTED NOT DETECTED Final    Comment: Performed at [facility], [place] [address]  [place], [place] [address]  Culture, sputum-assessment     Status: None   Collection Time: 09/19/17  2:30 AM  Result Value Ref Range Status   Specimen Description EXPECTORATED SPUTUM  Final   Special Requests Immunocompromised  Final   Sputum evaluation THIS SPECIMEN IS ACCEPTABLE FOR SPUTUM CULTURE  Final   Report Status 09/19/2017 FINAL  Final  Culture, respiratory (NON-Expectorated)     Status: None   Collection Time: 09/19/17  2:30 AM  Result Value Ref Range Status   Specimen Description EXPECTORATED SPUTUM  Final   Special Requests Immunocompromised Reflexed from [id number]  Final   Gram Stain   Final    FEW WBC PRESENT, PREDOMINANTLY PMN RARE SQUAMOUS EPITHELIAL CELLS PRESENT RARE GRAM POSITIVE COCCI IN PAIRS RARE GRAM POSITIVE RODS  Culture   Final    Consistent with normal respiratory flora. Performed at [facility], [place] [address], [place], [place] [address]    Report Status 09/21/2017 FINAL  Final     Labs: Basic Metabolic Panel: Recent Labs  Lab 09/18/17 1847 09/19/17 0502 09/22/17 0524  NA 141 137 139  K 3.7 4.3 4.0  CL 107 110 104  CO2 24 21* 27  GLUCOSE 135* 188* 194*  BUN 8 9 19   CREATININE 0.82 0.72 0.76  CALCIUM 9.6 8.9 9.2   Liver Function Tests: No results for input(s): AST, ALT, ALKPHOS, BILITOT, PROT, ALBUMIN in the last 168 hours. No results for input(s): LIPASE, AMYLASE in the last 168 hours. No results for input(s): AMMONIA in the last 168 hours. CBC: Recent Labs  Lab 09/18/17 1847 09/19/17 0502 09/22/17 0524  WBC 12.0* 11.4* 14.7*  NEUTROABS 10.3* 10.8* 13.1*  HGB 13.5 12.1 12.3  HCT 39.8 35.7* 36.4  MCV 86.3 86.4 85.8  PLT 299 283 261   Cardiac Enzymes: No results for input(s): CKTOTAL, CKMB, CKMBINDEX, TROPONINI in the last 168 hours. BNP: BNP (last 3 results) No results for input(s): BNP in the last 8760 hours.  ProBNP (last 3 results) No results for input(s): PROBNP in the last 8760 hours.  CBG: Recent Labs  Lab  09/21/17 1215 09/21/17 1623 09/21/17 2122 09/22/17 0731 09/22/17 1121  GLUCAP 201* 205* 213* 208* 204*       Signed:  Irine Seal MD.  [facility] 09/22/2017, 4:18 PM

## 2017-09-22 NOTE — Progress Notes (Signed)
All d/c instructions given w/ verbal understanding.D/C to home w/ friend via w/c voices no c/o.

## 2017-09-24 LAB — CULTURE, BLOOD (ROUTINE X 2)
Culture: NO GROWTH
Culture: NO GROWTH
SPECIAL REQUESTS: ADEQUATE
Special Requests: ADEQUATE

## 2017-09-25 ENCOUNTER — Encounter (HOSPITAL_COMMUNITY): Payer: Self-pay | Admitting: Emergency Medicine

## 2017-09-25 ENCOUNTER — Emergency Department (HOSPITAL_COMMUNITY): Payer: BLUE CROSS/BLUE SHIELD

## 2017-09-25 ENCOUNTER — Inpatient Hospital Stay (HOSPITAL_COMMUNITY)
Admission: EM | Admit: 2017-09-25 | Discharge: 2017-09-27 | DRG: 202 | Disposition: A | Discharge status: Home / Self Care | Payer: BLUE CROSS/BLUE SHIELD | Attending: Family Medicine | Admitting: Family Medicine

## 2017-09-25 DIAGNOSIS — M0520 Rheumatoid vasculitis with rheumatoid arthritis of unspecified site: Secondary | ICD-10-CM | POA: Diagnosis present

## 2017-09-25 DIAGNOSIS — M069 Rheumatoid arthritis, unspecified: Secondary | ICD-10-CM | POA: Diagnosis not present

## 2017-09-25 DIAGNOSIS — K219 Gastro-esophageal reflux disease without esophagitis: Secondary | ICD-10-CM | POA: Diagnosis present

## 2017-09-25 DIAGNOSIS — I5032 Chronic diastolic (congestive) heart failure: Secondary | ICD-10-CM | POA: Diagnosis not present

## 2017-09-25 DIAGNOSIS — F329 Major depressive disorder, single episode, unspecified: Secondary | ICD-10-CM | POA: Diagnosis present

## 2017-09-25 DIAGNOSIS — J209 Acute bronchitis, unspecified: Secondary | ICD-10-CM | POA: Diagnosis not present

## 2017-09-25 DIAGNOSIS — E785 Hyperlipidemia, unspecified: Secondary | ICD-10-CM | POA: Diagnosis present

## 2017-09-25 DIAGNOSIS — D72829 Elevated white blood cell count, unspecified: Secondary | ICD-10-CM | POA: Diagnosis present

## 2017-09-25 DIAGNOSIS — I10 Essential (primary) hypertension: Secondary | ICD-10-CM | POA: Diagnosis not present

## 2017-09-25 DIAGNOSIS — T380X5A Adverse effect of glucocorticoids and synthetic analogues, initial encounter: Secondary | ICD-10-CM | POA: Diagnosis present

## 2017-09-25 DIAGNOSIS — I110 Hypertensive heart disease with heart failure: Secondary | ICD-10-CM | POA: Diagnosis present

## 2017-09-25 DIAGNOSIS — J309 Allergic rhinitis, unspecified: Secondary | ICD-10-CM | POA: Diagnosis present

## 2017-09-25 DIAGNOSIS — Z87891 Personal history of nicotine dependence: Secondary | ICD-10-CM

## 2017-09-25 DIAGNOSIS — R0602 Shortness of breath: Secondary | ICD-10-CM | POA: Diagnosis not present

## 2017-09-25 DIAGNOSIS — Z794 Long term (current) use of insulin: Secondary | ICD-10-CM

## 2017-09-25 DIAGNOSIS — D869 Sarcoidosis, unspecified: Secondary | ICD-10-CM | POA: Diagnosis present

## 2017-09-25 DIAGNOSIS — D860 Sarcoidosis of lung: Secondary | ICD-10-CM | POA: Diagnosis not present

## 2017-09-25 DIAGNOSIS — J069 Acute upper respiratory infection, unspecified: Secondary | ICD-10-CM | POA: Diagnosis not present

## 2017-09-25 DIAGNOSIS — R0609 Other forms of dyspnea: Secondary | ICD-10-CM

## 2017-09-25 DIAGNOSIS — Z79899 Other long term (current) drug therapy: Secondary | ICD-10-CM

## 2017-09-25 DIAGNOSIS — F411 Generalized anxiety disorder: Secondary | ICD-10-CM | POA: Diagnosis not present

## 2017-09-25 DIAGNOSIS — Z91030 Bee allergy status: Secondary | ICD-10-CM

## 2017-09-25 DIAGNOSIS — Z8711 Personal history of peptic ulcer disease: Secondary | ICD-10-CM

## 2017-09-25 DIAGNOSIS — Z7983 Long term (current) use of bisphosphonates: Secondary | ICD-10-CM

## 2017-09-25 DIAGNOSIS — Z91013 Allergy to seafood: Secondary | ICD-10-CM

## 2017-09-25 DIAGNOSIS — Z888 Allergy status to other drugs, medicaments and biological substances status: Secondary | ICD-10-CM

## 2017-09-25 DIAGNOSIS — E119 Type 2 diabetes mellitus without complications: Secondary | ICD-10-CM | POA: Diagnosis present

## 2017-09-25 DIAGNOSIS — Z6841 Body Mass Index (BMI) 40.0 and over, adult: Secondary | ICD-10-CM

## 2017-09-25 DIAGNOSIS — Z7951 Long term (current) use of inhaled steroids: Secondary | ICD-10-CM

## 2017-09-25 DIAGNOSIS — Z881 Allergy status to other antibiotic agents status: Secondary | ICD-10-CM

## 2017-09-25 DIAGNOSIS — G43909 Migraine, unspecified, not intractable, without status migrainosus: Secondary | ICD-10-CM | POA: Diagnosis present

## 2017-09-25 DIAGNOSIS — Z880 Allergy status to penicillin: Secondary | ICD-10-CM

## 2017-09-25 DIAGNOSIS — R0600 Dyspnea, unspecified: Secondary | ICD-10-CM | POA: Diagnosis present

## 2017-09-25 LAB — BASIC METABOLIC PANEL WITH GFR
Anion gap: 9 (ref 5–15)
BUN: 19 mg/dL (ref 6–20)
CO2: 24 mmol/L (ref 22–32)
Calcium: 8.7 mg/dL — ABNORMAL LOW (ref 8.9–10.3)
Chloride: 101 mmol/L (ref 101–111)
Creatinine, Ser: 0.97 mg/dL (ref 0.44–1.00)
GFR calc Af Amer: >60 mL/min
GFR calc non Af Amer: >60 mL/min
Glucose, Bld: 345 mg/dL — ABNORMAL HIGH (ref 65–99)
Potassium: 3.3 mmol/L — ABNORMAL LOW (ref 3.5–5.1)
Sodium: 134 mmol/L — ABNORMAL LOW (ref 135–145)

## 2017-09-25 LAB — CBC
HCT: 37.5 % (ref 36.0–46.0)
Hemoglobin: 12.6 g/dL (ref 12.0–15.0)
MCH: 28.9 pg (ref 26.0–34.0)
MCHC: 33.6 g/dL (ref 30.0–36.0)
MCV: 86 fL (ref 78.0–100.0)
Platelets: 266 10*3/uL (ref 150–400)
RBC: 4.36 MIL/uL (ref 3.87–5.11)
RDW: 13.7 % (ref 11.5–15.5)
WBC: 13.7 10*3/uL — ABNORMAL HIGH (ref 4.0–10.5)

## 2017-09-25 LAB — GLUCOSE, CAPILLARY: Glucose-Capillary: 256 mg/dL — ABNORMAL HIGH (ref 65–99)

## 2017-09-25 LAB — I-STAT TROPONIN, ED: Troponin i, poc: 0 ng/mL (ref 0.00–0.08)

## 2017-09-25 MED ORDER — IPRATROPIUM BROMIDE 0.02 % IN SOLN: 0.5000 mg | Freq: Four times a day (QID) | RESPIRATORY_TRACT | Status: DC

## 2017-09-25 MED ORDER — INSULIN ASPART 100 UNIT/ML ~~LOC~~ SOLN
0.0000 [IU] | Freq: Three times a day (TID) | SUBCUTANEOUS | Status: DC
  Administered 2017-09-26: 3 [IU] via SUBCUTANEOUS
  Administered 2017-09-26: 2 [IU] via SUBCUTANEOUS
  Administered 2017-09-26: 5 [IU] via SUBCUTANEOUS
  Administered 2017-09-27: 3 [IU] via SUBCUTANEOUS

## 2017-09-25 MED ORDER — MAGNESIUM SULFATE 50 % IJ SOLN: 1.0000 g | Freq: Once | INTRAMUSCULAR | Status: DC

## 2017-09-25 MED ORDER — BENZONATATE 100 MG PO CAPS: 200.0000 mg | ORAL_CAPSULE | Freq: Three times a day (TID) | ORAL | Status: DC | PRN

## 2017-09-25 MED ORDER — SODIUM CHLORIDE 0.9% FLUSH
3.0000 mL | Freq: Two times a day (BID) | INTRAVENOUS | Status: DC
  Administered 2017-09-25 – 2017-09-27 (×4): 3 mL via INTRAVENOUS

## 2017-09-25 MED ORDER — IPRATROPIUM BROMIDE 0.02 % IN SOLN
0.5000 mg | Freq: Once | RESPIRATORY_TRACT | Status: AC
  Administered 2017-09-25: 0.5 mg via RESPIRATORY_TRACT
  Filled 2017-09-25: qty 2.5

## 2017-09-25 MED ORDER — INSULIN ASPART 100 UNIT/ML ~~LOC~~ SOLN
12.0000 [IU] | Freq: Three times a day (TID) | SUBCUTANEOUS | Status: DC
  Administered 2017-09-26 – 2017-09-27 (×4): 12 [IU] via SUBCUTANEOUS

## 2017-09-25 MED ORDER — SERTRALINE HCL 100 MG PO TABS
200.0000 mg | ORAL_TABLET | Freq: Every day | ORAL | Status: DC
  Administered 2017-09-25 – 2017-09-27 (×3): 200 mg via ORAL
  Filled 2017-09-25 (×3): qty 2

## 2017-09-25 MED ORDER — NYSTATIN 100000 UNIT/ML MT SUSP
5.0000 mL | Freq: Four times a day (QID) | OROMUCOSAL | Status: DC
  Administered 2017-09-25 – 2017-09-27 (×6): 500000 [IU] via ORAL
  Filled 2017-09-25 (×6): qty 5

## 2017-09-25 MED ORDER — VITAMIN B-12 1000 MCG PO TABS
1000.0000 ug | ORAL_TABLET | Freq: Every day | ORAL | Status: DC
  Administered 2017-09-25 – 2017-09-27 (×3): 1000 ug via ORAL
  Filled 2017-09-25 (×3): qty 1

## 2017-09-25 MED ORDER — ALBUTEROL SULFATE (2.5 MG/3ML) 0.083% IN NEBU: 2.5000 mg | INHALATION_SOLUTION | RESPIRATORY_TRACT | Status: DC | PRN

## 2017-09-25 MED ORDER — GUAIFENESIN ER 600 MG PO TB12
1200.0000 mg | ORAL_TABLET | Freq: Two times a day (BID) | ORAL | Status: DC
  Administered 2017-09-25 – 2017-09-27 (×4): 1200 mg via ORAL
  Filled 2017-09-25 (×6): qty 2

## 2017-09-25 MED ORDER — FUROSEMIDE 40 MG PO TABS
40.0000 mg | ORAL_TABLET | Freq: Every day | ORAL | Status: DC
  Administered 2017-09-25 – 2017-09-27 (×3): 40 mg via ORAL
  Filled 2017-09-25 (×3): qty 1

## 2017-09-25 MED ORDER — ROSUVASTATIN CALCIUM 10 MG PO TABS
20.0000 mg | ORAL_TABLET | Freq: Every day | ORAL | Status: DC
  Administered 2017-09-25 – 2017-09-27 (×3): 20 mg via ORAL
  Filled 2017-09-25 (×3): qty 2

## 2017-09-25 MED ORDER — FLUTICASONE PROPIONATE 50 MCG/ACT NA SUSP
2.0000 | Freq: Every day | NASAL | Status: DC
Start: 2017-09-26 — End: 2017-09-27
  Administered 2017-09-26 – 2017-09-27 (×2): 2 via NASAL
  Filled 2017-09-25: qty 16

## 2017-09-25 MED ORDER — LEVOFLOXACIN 750 MG PO TABS
750.0000 mg | ORAL_TABLET | Freq: Every day | ORAL | Status: DC
  Administered 2017-09-25 – 2017-09-26 (×2): 750 mg via ORAL
  Filled 2017-09-25 (×2): qty 1

## 2017-09-25 MED ORDER — CYCLOBENZAPRINE HCL 10 MG PO TABS
10.0000 mg | ORAL_TABLET | Freq: Every evening | ORAL | Status: DC | PRN
  Administered 2017-09-25: 10 mg via ORAL
  Filled 2017-09-25: qty 1

## 2017-09-25 MED ORDER — ALBUTEROL SULFATE (2.5 MG/3ML) 0.083% IN NEBU: 2.5000 mg | INHALATION_SOLUTION | Freq: Four times a day (QID) | RESPIRATORY_TRACT | Status: DC

## 2017-09-25 MED ORDER — SODIUM CHLORIDE 0.9 % IV SOLN: 250.0000 mL | INTRAVENOUS | Status: DC | PRN

## 2017-09-25 MED ORDER — INSULIN ASPART 100 UNIT/ML ~~LOC~~ SOLN
5.0000 [IU] | Freq: Once | SUBCUTANEOUS | Status: AC
  Administered 2017-09-25: 5 [IU] via SUBCUTANEOUS
  Filled 2017-09-25: qty 1

## 2017-09-25 MED ORDER — METHYLPREDNISOLONE SODIUM SUCC 125 MG IJ SOLR
125.0000 mg | Freq: Once | INTRAMUSCULAR | Status: AC
  Administered 2017-09-25: 125 mg via INTRAVENOUS
  Filled 2017-09-25: qty 2

## 2017-09-25 MED ORDER — LEVALBUTEROL TARTRATE 45 MCG/ACT IN AERO: 1.0000 | INHALATION_SPRAY | Freq: Three times a day (TID) | RESPIRATORY_TRACT | Status: DC | PRN

## 2017-09-25 MED ORDER — ALBUTEROL (5 MG/ML) CONTINUOUS INHALATION SOLN
10.0000 mg/h | INHALATION_SOLUTION | Freq: Once | RESPIRATORY_TRACT | Status: AC
  Administered 2017-09-25: 10 mg/h via RESPIRATORY_TRACT
  Filled 2017-09-25: qty 20

## 2017-09-25 MED ORDER — ACETAMINOPHEN 325 MG PO TABS
650.0000 mg | ORAL_TABLET | Freq: Once | ORAL | Status: AC
  Administered 2017-09-25: 650 mg via ORAL
  Filled 2017-09-25: qty 2

## 2017-09-25 MED ORDER — ACETAMINOPHEN 500 MG PO TABS
1000.0000 mg | ORAL_TABLET | Freq: Four times a day (QID) | ORAL | Status: DC | PRN
  Administered 2017-09-26: 1000 mg via ORAL
  Filled 2017-09-25: qty 2

## 2017-09-25 MED ORDER — MAGNESIUM SULFATE IN D5W 1-5 GM/100ML-% IV SOLN
1.0000 g | Freq: Once | INTRAVENOUS | Status: AC
  Administered 2017-09-25: 1 g via INTRAVENOUS
  Filled 2017-09-25: qty 100

## 2017-09-25 MED ORDER — ALPRAZOLAM 0.5 MG PO TABS
0.5000 mg | ORAL_TABLET | Freq: Three times a day (TID) | ORAL | Status: DC | PRN
  Administered 2017-09-25 – 2017-09-27 (×3): 0.5 mg via ORAL
  Filled 2017-09-25 (×3): qty 1

## 2017-09-25 MED ORDER — METHYLPREDNISOLONE SODIUM SUCC 125 MG IJ SOLR
60.0000 mg | Freq: Two times a day (BID) | INTRAMUSCULAR | Status: DC
  Administered 2017-09-25 – 2017-09-27 (×4): 60 mg via INTRAVENOUS
  Filled 2017-09-25 (×4): qty 2

## 2017-09-25 MED ORDER — FOLIC ACID 1 MG PO TABS
2.0000 mg | ORAL_TABLET | Freq: Every day | ORAL | Status: DC
  Administered 2017-09-25 – 2017-09-27 (×3): 2 mg via ORAL
  Filled 2017-09-25 (×3): qty 2

## 2017-09-25 MED ORDER — ZONISAMIDE 100 MG PO CAPS
100.0000 mg | ORAL_CAPSULE | Freq: Every day | ORAL | Status: DC
  Administered 2017-09-25 – 2017-09-27 (×3): 100 mg via ORAL
  Filled 2017-09-25 (×3): qty 1

## 2017-09-25 MED ORDER — IPRATROPIUM-ALBUTEROL 0.5-2.5 (3) MG/3ML IN SOLN
3.0000 mL | Freq: Three times a day (TID) | RESPIRATORY_TRACT | Status: DC
  Administered 2017-09-25 – 2017-09-27 (×5): 3 mL via RESPIRATORY_TRACT
  Filled 2017-09-25 (×4): qty 3

## 2017-09-25 MED ORDER — ONDANSETRON 4 MG PO TBDP: 8.0000 mg | ORAL_TABLET | Freq: Three times a day (TID) | ORAL | Status: DC | PRN

## 2017-09-25 MED ORDER — AZELASTINE HCL 0.1 % NA SOLN: 2.0000 | Freq: Two times a day (BID) | NASAL | Status: DC | PRN

## 2017-09-25 MED ORDER — SODIUM CHLORIDE 0.9% FLUSH: 3.0000 mL | INTRAVENOUS | Status: DC | PRN

## 2017-09-25 NOTE — ED Triage Notes (Signed)
Pt reports continued wheezing and pain (rib and bruise on LLQ) since being discharged from the hospital for PNA on Thursday. Have been taking abx with no relief.

## 2017-09-25 NOTE — H&P (Signed)
History and Physical    ELLIANNAH WAYMENT KXF:[id number] DOB: Jul 21, 1976 DOA: 09/25/2017  PCP: Biagio Borg, MD  Patient coming from:  home  Chief Complaint: still wheezing and sob  HPI: GILBERT NARAIN is a 41 y.o. female with medical history significant of sarcoidosis, dm, obesity just d/c a couple of days ago for a URI sent home on levaquin and prednisone taper with nebs comes in with still not feeling well.  She reports she is taking her steroids and levaquin.  Doing her nebs at home.  Still sob and coughing a lot along with wheezing.  No fevers.  No n/v/d.  No swelling in her legs.  Pt referred for admission for sarcoidosis flare.  Review of Systems: As per HPI otherwise 10 point review of systems negative.   Past Medical History:  Diagnosis Date  . Allergic rhinitis, cause unspecified   . Anemia   . Anxiety   . Depression   . Diabetes mellitus type II    steroid related, patient states "im no longer diabetic"  . Essential hypertension 08/08/2015  . GERD (gastroesophageal reflux disease)   . History of blood transfusion   . Hyperlipidemia   . Menorrhagia   . Migraine   . Morbid obesity ([place])   . Otitis media 06/28/2015  . Peptic ulcer disease   . Positive ANA (antinuclear antibody) 02/14/2012  . Sarcoid    including hand per rheumatology-Dr. Amil Amen  . Sarcoidosis of lung ([place])   . Shortness of breath    on exertion  . Varicose veins with pain     Past Surgical History:  Procedure Laterality Date  . ABDOMINAL HYSTERECTOMY    . COLONOSCOPY WITH PROPOFOL N/A 10/19/2016   Procedure: COLONOSCOPY WITH PROPOFOL;  Surgeon: Mauri Pole, MD;  Location: [facility];  Service: Endoscopy;  Laterality: N/A;  . ESOPHAGOGASTRODUODENOSCOPY (EGD) WITH PROPOFOL N/A 10/19/2016   Procedure: ESOPHAGOGASTRODUODENOSCOPY (EGD) WITH PROPOFOL;  Surgeon: Mauri Pole, MD;  Location: [facility];  Service: Endoscopy;  Laterality: N/A;  . HERNIA MESH REMOVAL  02/2013  . uterine  ablation  03/2010  . WISDOM TOOTH EXTRACTION       reports that she quit smoking about 4 years ago. Her smoking use included cigarettes. She has a 20.00 pack-year smoking history. she has never used smokeless tobacco. She reports that she does not drink alcohol or use drugs.  Allergies  Allergen Reactions  . Azithromycin Hives    (z pak) hives  . Bee Venom Anaphylaxis  . Flagyl [Metronidazole] Hives and Shortness Of Breath  . Penicillins Shortness Of Breath and Swelling    Has patient had a PCN reaction causing immediate rash, facial/tongue/throat swelling, SOB or lightheadedness with hypotension: Yes Has patient had a PCN reaction causing severe rash involving mucus membranes or skin necrosis: No Has patient had a PCN reaction that required hospitalization No Has patient had a PCN reaction occurring within the last 10 years: No If all of the above answers are "NO", then may proceed with Cephalosporin use.  REACTION: swelling and difficulty breathing  . Shellfish Allergy Anaphylaxis  . Klonopin [Clonazepam] Other (See Comments)    Memory difficulty    Family History  Problem Relation Age of Onset  . Allergies Mother   . Heart attack Mother   . Diabetes Mother   . Diabetes Father   . Heart disease Father   . Rheum arthritis Father   . Stroke Father   . Hypertension Unknown   . Ovarian  cancer Maternal Aunt   . Lung cancer Maternal Aunt   . Breast cancer Maternal Aunt   . Bone cancer Maternal Aunt   . Stroke Maternal Uncle   . Other Neg Hx     Prior to Admission medications   Medication Sig Start Date End Date Taking? Authorizing Provider  acetaminophen (TYLENOL) 500 MG tablet Take 1,000 mg by mouth every 6 (six) hours as needed for mild pain or moderate pain.   Yes [provider]  alendronate (FOSAMAX) 70 MG tablet Take 1 tablet (70 mg total) by mouth every 7 (seven) days. 12/22/16  Yes Lyndal Pulley, DO  ALPRAZolam Duanne Moron) 0.5 MG tablet Take 1 tablet (0.5 mg  total) by mouth 3 (three) times daily as needed. 07/14/17  Yes Biagio Borg, MD  azelastine (ASTELIN) 0.1 % nasal spray Place 2 sprays into both nostrils 2 (two) times daily. Use in each nostril as directed 03/11/16  Yes Biagio Borg, MD  benzonatate (TESSALON) 200 MG capsule Take 1 capsule (200 mg total) by mouth 3 (three) times daily as needed for cough. 09/22/17  Yes Eugenie Filler, MD  budesonide-formoterol [facility]) 160-4.5 MCG/ACT inhaler Inhale 2 puffs into the lungs 2 (two) times daily. 06/30/16  Yes Collene Gobble, MD  cetirizine (ZYRTEC) 10 MG tablet TAKE 1 TABLET(10 MG) BY MOUTH DAILY 08/24/17  Yes Biagio Borg, MD  Cholecalciferol (VITAMIN D3 PO) Take 500 Units by mouth 3 (three) times daily.   Yes [provider]  cyclobenzaprine (FLEXERIL) 5 MG tablet Take 2 tablets (10 mg total) by mouth at bedtime as needed for muscle spasms. 09/22/17  Yes Eugenie Filler, MD  Diclofenac Sodium (PENNSAID) 2 % SOLN Apply 1 pump twice daily as needed. 08/10/17  Yes Hulan Saas M, DO  fluticasone (FLONASE) 50 MCG/ACT nasal spray Place 2 sprays into both nostrils daily. 09/23/17  Yes Eugenie Filler, MD  folic acid (FOLVITE) 1 MG tablet TAKE 2 TABLETS BY MOUTH DAILY 08/23/17  Yes Byrum, Rose Fillers, MD  furosemide (LASIX) 40 MG tablet 1 tab by mouth every day in the AM and then 1 as needed only for persistent swelling in the PM 06/16/16  Yes Biagio Borg, MD  gabapentin (NEURONTIN) 300 MG capsule TAKE 1 CAPSULE(300 MG) BY MOUTH THREE TIMES DAILY 10/05/16  Yes Biagio Borg, MD  guaiFENesin (MUCINEX) 600 MG 12 hr tablet Take 2 tablets (1,200 mg total) by mouth 2 (two) times daily for 4 days. 09/22/17 09/26/17 Yes Eugenie Filler, MD  HUMALOG 100 UNIT/ML injection INJECT UP TO 12 UNITS INTO THE SKIN THREE TIMES DAILY WITH MEALS WITH SLIDING SCALE AS DIRECTED 09/20/17  Yes Biagio Borg, MD  levalbuterol [facility] HFA) 45 MCG/ACT inhaler Inhale 1-2 puffs into the lungs every 8 (eight)  hours as needed for wheezing or shortness of breath. USE 2 PUFFS 3 TIMES DAILY X 5 DAYS THEN BACK TO HOME REGIMEN AS NEEDED. 09/22/17  Yes Eugenie Filler, MD  levofloxacin (LEVAQUIN) 750 MG tablet Take 1 tablet (750 mg total) by mouth at bedtime for 4 days. 09/22/17 09/26/17 Yes Eugenie Filler, MD  nystatin (MYCOSTATIN) 100000 UNIT/ML suspension Take 5 mLs (500,000 Units total) by mouth 4 (four) times daily for 5 days. 09/22/17 09/27/17 Yes Eugenie Filler, MD  omeprazole (PRILOSEC) 40 MG capsule Take 1 capsule (40 mg total) by mouth daily. 06/15/17  Yes Biagio Borg, MD  ondansetron (ZOFRAN-ODT) 8 MG disintegrating tablet Take 1 tablet (  8 mg total) by mouth every 8 (eight) hours as needed for nausea or vomiting. 05/14/17  Yes Tower, Wynelle Fanny, MD  oxyCODONE-acetaminophen (PERCOCET) 10-325 MG tablet Take 1 tablet by mouth daily as needed for pain.   Yes [provider]  potassium chloride (K-DUR) 10 MEQ tablet 3 tab by mouth per day only when taking the lasix Patient taking differently: Take 10-20 mEq by mouth 2 (two) times daily. Take 10 meq in the morning and 50mq at night 06/17/16  Yes JBiagio Borg MD  predniSONE (DELTASONE) 20 MG tablet Take 1-3 tablets (20-60 mg total) by mouth daily before breakfast. Take 3 tablets (621m x 1 day, then 2 tablets (405mx 3 days, then 1 tablet (29m44maily x 3 days then stop. 09/23/17  Yes ThomEugenie Filler  ranitidine (ZANTAC) 150 MG tablet TAKE 1 TABLET(150 MG) BY MOUTH AT BEDTIME 01/24/17  Yes Nandigam, KaviVenia Minks  rosuvastatin (CRESTOR) 20 MG tablet Take 1 tablet (20 mg total) by mouth daily. 06/15/17  Yes JohnBiagio Borg  sertraline (ZOLOFT) 100 MG tablet TAKE 2 TABLETS BY MOUTH DAILY 05/11/17  Yes JohnBiagio Borg  SUMAtriptan (IMITREX) 100 MG tablet Take 1 tablet by mouth at the onset of a headache. May repeat in 2 hours if headache persists or recurs. 10/13/16  Yes Jaffe, Adam R, DO  telmisartan (MICARDIS) 40 MG tablet TAKE 1  TABLET(40 MG) BY MOUTH DAILY 05/11/17  Yes JohnBiagio Borg  TURMERIC PO Take 1 tablet by mouth daily.   Yes [provider]  vitamin B-12 (CYANOCOBALAMIN) 1000 MCG tablet Take 1,000 mcg by mouth daily.   Yes [provider]  Vitamin D, Ergocalciferol, (DRISDOL) 50000 units CAPS capsule TAKE 1 CAPSULE BY MOUTH EVERY 7 DAYS 02/16/17  Yes SmitHulan SaasDO  zonisamide (ZONEGRAN) 100 MG capsule Take 1 capsule (100 mg total) by mouth daily. 02/23/17  Yes Jaffe, Adam R, DO  Blood Glucose Monitoring Suppl (ONE TOUCH ULTRA 2) W/DEVICE KIT Use as directed four times daily. Dx: E11.9 08/11/15   ElliRenato Shin  glucose blood (ONE TOUCH ULTRA TEST) test strip Use to check blood sugars four times a day Dx E11.9 08/11/15   ElliRenato Shin  Insulin Syringe-Needle U-100 25G X 1" 1 ML MISC Use to administer humalog insulin three times a day Dx E11.9 10/13/16   JohnBiagio Borg  [facility]erol [facility]) 45 MCG/ACT inhaler INHALE 1-2 PUFFS INTO THE LUNGS TWICE DAILY 09/24/17   JohnBiagio Borg  lidocaine (XYLOCAINE) 2 % solution Use as directed 20 mLs in the mouth or throat as needed for mouth pain. Patient not taking: Reported on 09/18/2017 01/04/17   JohnBiagio Borg  methotrexate (50 MG/ML) 1 g injection Inject 50 mg into the vein once.    [provider]  methotrexate (RHEUMATREX) 2.5 MG tablet Take 15 mg by mouth every Tuesday.     [provider]  naproxen (NAPROSYN) 500 MG tablet Take 1 tablet (500 mg total) by mouth 2 (two) times daily as needed. Patient not taking: Reported on 07/13/2017 06/14/17   JohnBiagio Borg  [facility] TEST test strip TEST BLOOD SUGAR FOUR TIMES DAILY 12/22/16   JohnBiagio Borg    Physical Exam: Vitals:   09/25/17 1115 09/25/17 1130 09/25/17 1145 09/25/17 1200  BP:  (!) 155/74  (!) 114/100  Pulse: 99 (!) 116 (!) 107 (!) 111  Resp: (!)Marland Kitchen  _0 Temp:      TempSrc:      SpO2:          Constitutional: NAD, calm,  comfortable Vitals:   09/25/17 1115 09/25/17 1130 09/25/17 1145 09/25/17 1200  BP:  (!) 155/74  (!) 114/100  Pulse: 99 (!) 116 (!) 107 (!) 111  Resp: (!) _1 Temp:      TempSrc:      SpO2:       Eyes: PERRL, lids and conjunctivae normal ENMT: Mucous membranes are moist. Posterior pharynx clear of any exudate or lesions.Normal dentition.  Neck: normal, supple, no masses, no thyromegaly Respiratory: clear to auscultation bilaterally, no wheezing, no crackles. Normal respiratory effort. No accessory muscle use.  Cardiovascular: Regular rate and rhythm, no murmurs / rubs / gallops. No extremity edema. 2+ pedal pulses. No carotid bruits.  Abdomen: no tenderness, no masses palpated. No hepatosplenomegaly. Bowel sounds positive.  Musculoskeletal: no clubbing / cyanosis. No joint deformity upper and lower extremities. Good ROM, no contractures. Normal muscle tone.  Skin: no rashes, lesions, ulcers. No induration Neurologic: CN 2-12 grossly intact. Sensation intact, DTR normal. Strength 5/5 in all 4.  Psychiatric: Normal judgment and insight. Alert and oriented x 3. Normal mood.    Labs on Admission: I have personally reviewed following labs and imaging studies  CBC: Recent Labs  Lab 09/18/17 1847 09/19/17 0502 09/22/17 0524 09/25/17 1000  WBC 12.0* 11.4* 14.7* 13.7*  NEUTROABS 10.3* 10.8* 13.1*  --   HGB 13.5 12.1 12.3 12.6  HCT 39.8 35.7* 36.4 37.5  MCV 86.3 86.4 85.8 86.0  PLT 299 283 261 287   Basic Metabolic Panel: Recent Labs  Lab 09/18/17 1847 09/19/17 0502 09/22/17 0524 09/25/17 1000  NA 141 137 139 134*  K 3.7 4.3 4.0 3.3*  CL 107 110 104 101  CO2 24 21* 27 24  GLUCOSE 135* 188* 194* 345*  BUN _2 CREATININE 0.82 0.72 0.76 0.97  CALCIUM 9.6 8.9 9.2 8.7*   GFR: Estimated Creatinine Clearance: 110.6 mL/min (by C-G formula based on SCr of 0.97 mg/dL). Liver Function Tests: No results for input(s): AST, ALT, ALKPHOS, BILITOT, PROT, ALBUMIN in the  last 168 hours. No results for input(s): LIPASE, AMYLASE in the last 168 hours. No results for input(s): AMMONIA in the last 168 hours. Coagulation Profile: No results for input(s): INR, PROTIME in the last 168 hours. Cardiac Enzymes: No results for input(s): CKTOTAL, CKMB, CKMBINDEX, TROPONINI in the last 168 hours. BNP (last 3 results) No results for input(s): PROBNP in the last 8760 hours. HbA1C: No results for input(s): HGBA1C in the last 72 hours. CBG: Recent Labs  Lab 09/21/17 1215 09/21/17 1623 09/21/17 2122 09/22/17 0731 09/22/17 1121  GLUCAP 201* 205* 213* 208* 204*   Lipid Profile: No results for input(s): CHOL, HDL, LDLCALC, TRIG, CHOLHDL, LDLDIRECT in the last 72 hours. Thyroid Function Tests: No results for input(s): TSH, T4TOTAL, FREET4, T3FREE, THYROIDAB in the last 72 hours. Anemia Panel: No results for input(s): VITAMINB12, FOLATE, FERRITIN, TIBC, IRON, RETICCTPCT in the last 72 hours. Urine analysis:    Component Value Date/Time   COLORURINE YELLOW 06/15/2017 1601   APPEARANCEUR CLEAR 06/15/2017 1601   LABSPEC 1.020 06/15/2017 1601   PHURINE 6.0 06/15/2017 1601   GLUCOSEU NEGATIVE 06/15/2017 1601   HGBUR NEGATIVE 06/15/2017 1601   BILIRUBINUR NEGATIVE 06/15/2017 1601   BILIRUBINUR neg 01/28/2013 [place] 06/15/2017 1601  PROTEINUR NEGATIVE 10/31/2016 1926   UROBILINOGEN 0.2 06/15/2017 1601   NITRITE NEGATIVE 06/15/2017 1601   LEUKOCYTESUR NEGATIVE 06/15/2017 1601   Sepsis Labs: !!!!!!!!!!!!!!!!!!!!!!!!!!!!!!!!!!!!!!!!!!!! _0 (procalcitonin:4,lacticidven:4) ) Recent Results (from the past 240 hour(s))  Culture, blood (routine x 2)     Status: None   Collection Time: 09/18/17  8:18 PM  Result Value Ref Range Status   Specimen Description BLOOD RIGHT HAND  Final   Special Requests   Final    BOTTLES DRAWN AEROBIC AND ANAEROBIC Blood Culture adequate volume   Culture   Final    NO GROWTH 5 DAYS Performed at [facility], [place] [address], [place], [place] [address]    Report Status 09/24/2017 FINAL  Final  Culture, blood (routine x 2)     Status: None   Collection Time: 09/18/17  8:36 PM  Result Value Ref Range Status   Specimen Description BLOOD LEFT HAND  Final   Special Requests   Final    BOTTLES DRAWN AEROBIC AND ANAEROBIC Blood Culture adequate volume   Culture   Final    NO GROWTH 5 DAYS Performed at [facility], [place] [address], [place], [place] [address]    Report Status 09/24/2017 FINAL  Final  Respiratory Panel by PCR     Status: Abnormal   Collection Time: 09/18/17 11:34 PM  Result Value Ref Range Status   Adenovirus NOT DETECTED NOT DETECTED Final   Coronavirus 229E NOT DETECTED NOT DETECTED Final   Coronavirus HKU1 NOT DETECTED NOT DETECTED Final   Coronavirus NL63 NOT DETECTED NOT DETECTED Final   Coronavirus OC43 NOT DETECTED NOT DETECTED Final   Metapneumovirus NOT DETECTED NOT DETECTED Final   Rhinovirus / Enterovirus DETECTED (A) NOT DETECTED Final   Influenza A NOT DETECTED NOT DETECTED Final   Influenza B NOT DETECTED NOT DETECTED Final   Parainfluenza Virus 1 NOT DETECTED NOT DETECTED Final   Parainfluenza Virus 2 NOT DETECTED NOT DETECTED Final   Parainfluenza Virus 3 NOT DETECTED NOT DETECTED Final   Parainfluenza Virus 4 NOT DETECTED NOT DETECTED Final   Respiratory Syncytial Virus NOT DETECTED NOT DETECTED Final   Bordetella pertussis NOT DETECTED NOT DETECTED Final   Chlamydophila pneumoniae NOT DETECTED NOT DETECTED Final   Mycoplasma pneumoniae NOT DETECTED NOT DETECTED Final    Comment: Performed at [facility], [place] [address] [place], [place] [address]  Culture, sputum-assessment     Status: None   Collection Time: 09/19/17  2:30 AM  Result Value Ref Range Status   Specimen Description EXPECTORATED SPUTUM  Final   Special Requests Immunocompromised  Final   Sputum evaluation THIS SPECIMEN IS ACCEPTABLE FOR SPUTUM CULTURE  Final   Report  Status 09/19/2017 FINAL  Final  Culture, respiratory (NON-Expectorated)     Status: None   Collection Time: 09/19/17  2:30 AM  Result Value Ref Range Status   Specimen Description EXPECTORATED SPUTUM  Final   Special Requests Immunocompromised Reflexed from [id number]  Final   Gram Stain   Final    FEW WBC PRESENT, PREDOMINANTLY PMN RARE SQUAMOUS EPITHELIAL CELLS PRESENT RARE GRAM POSITIVE COCCI IN PAIRS RARE GRAM POSITIVE RODS    Culture   Final    Consistent with normal respiratory flora. Performed at [facility], [place] [address], [place],  [address]    Report Status 09/21/2017 FINAL  Final     Radiological Exams on Admission: Dg Chest 2 View  Result Date: 09/25/2017 CLINICAL DATA:  Shortness of breath and wheezing. EXAM: CHEST  2 VIEW COMPARISON:  09/18/2017 FINDINGS: The lungs are clear without focal pneumonia, edema, pneumothorax or pleural effusion. Diffuse interstitial opacity is seen bilaterally. No focal airspace consolidation or pulmonary edema. The cardiopericardial silhouette is within normal limits for size. The visualized bony structures of the thorax are intact. IMPRESSION: Stable without new or acute interval findings. Electronically Signed   By: Misty Stanley M.D.   On: 09/25/2017 09:10     Assessment/Plan 41 yo female with sarcoidosis with recent URI/CAP comes in with persistent sob and wheezing  Principal Problem:   URI (upper respiratory infection)- rhinovirus postiive last week.  Cont supportive care, nebs, steroids as below.  Pt AF oxygen sats are 100% at rest on RA.  Ambulate and see if she desats. obs on med.  Active Problems:   PULMONARY SARCOIDOSIS- iv steroids again, nebs   Anxiety state- noted   Essential hypertension- cont home meds   DOE (dyspnea on exertion)- as above   Polyarthralgia- noted   Rheumatoid arthritis ([place])- noted   Chronic diastolic CHF (congestive heart failure) ([place])- stable at this time    DVT prophylaxis:  scds Code  Status:  full Family Communication:  none Disposition Plan:  Per day team Consults called:  none Admission status:  observation   , A MD [facility]  If 7PM-7AM, please contact night-coverage [email or web address] Password [email or web address]  09/25/2017, 1:34 PM

## 2017-09-25 NOTE — ED Provider Notes (Signed)
[facility] DEPT Provider Note   CSN: [id number] Arrival date & time: 09/25/17  0831     History   Chief Complaint Chief Complaint  Patient presents with  . Shortness of Breath    HPI Nicole Melendez is a 41 y.o. female.  HPI   41 year old female with history of sarcoidosis of the lung, diabetes, obesity, anemia presenting for evaluation of shortness of breath.  Patient recently discharged from the hospital 3 days ago after 4 days stay of shortness of breath when she was found to have evidence of pneumonia and was treated with IV Levaquin.  Her respiratory virus panel was positive for rhinovirus.  Patient subsequently sent home with p.o. antibiotic, steroid, cough medication, inhaler.  Patient states she felt fine the day that she was discharged in the next day however her symptoms returned.  She now endorse headache, persistent cough, wheezing, increased shortness of breath, generalized body aches, and having difficulty sleeping.  She is using her inhaler and her medication as prescribed without relief.  No report of neck stiffness, vomiting, diarrhea, or rash.  She did mention that she is also hard that she developed a bruise on her left side abdomen that is painful to the touch.  Past Medical History:  Diagnosis Date  . Allergic rhinitis, cause unspecified   . Anemia   . Anxiety   . Depression   . Diabetes mellitus type II    steroid related, patient states "im no longer diabetic"  . Essential hypertension 08/08/2015  . GERD (gastroesophageal reflux disease)   . History of blood transfusion   . Hyperlipidemia   . Menorrhagia   . Migraine   . Morbid obesity ([place])   . Otitis media 06/28/2015  . Peptic ulcer disease   . Positive ANA (antinuclear antibody) 02/14/2012  . Sarcoid    including hand per rheumatology-Dr. Amil Amen  . Sarcoidosis of lung ([place])   . Shortness of breath    on exertion  . Varicose veins with pain     Patient Active  Problem List   Diagnosis Date Noted  . Acute on chronic diastolic CHF (congestive heart failure) ([place]) 09/22/2017  . Rheumatoid arthritis ([place]) 09/18/2017  . CAP (community acquired pneumonia) 09/18/2017  . Chronic diastolic CHF (congestive heart failure) ([place]) 09/18/2017  . Pancreatitis 06/26/2017  . Polyarthralgia 06/26/2017  . Right ankle sprain 06/26/2017  . Oral thrush 06/22/2017  . Possible exposure to STD 06/15/2017  . Diarrhea 04/10/2017  . Bilateral hand swelling 04/07/2017  . Abdominal pain 04/07/2017  . Abnormality of gait 01/26/2017  . Avulsion fracture of right ankle 12/01/2016  . Ulcer of esophagus with bleeding   . Gastritis and gastroduodenitis   . Blepharitis of right upper eyelid 10/09/2016  . Peroneal tendinitis of lower leg, right 10/06/2016  . Right ankle pain 09/16/2016  . Dizziness 03/11/2016  . Mixed headache 03/09/2016  . Chronic venous insufficiency 02/24/2016  . Gastroenteritis 12/30/2015  . Headache 12/30/2015  . Epistaxis 12/09/2015  . 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 Cervical muscle strain 12/03/2015  . Right-sided face pain 11/26/2015  . Multiple contusions 11/26/2015  . Neck pain, bilateral posterior 11/26/2015  . Lower back pain 11/26/2015  . Spinal stenosis of lumbar region 10/28/2015  . Chronic sciatica of left side 10/28/2015  . DOE (dyspnea on exertion) 09/02/2015  . Acute bronchitis 08/22/2015  . Essential hypertension 08/08/2015  . Diaphoresis 08/07/2015  . Cough 07/02/2015  . Vaginitis and vulvovaginitis 06/29/2015  . Otitis  media 06/28/2015  . Thrush 06/24/2015  . Nausea & vomiting 06/10/2015  . Acute upper respiratory infection 11/14/2014  . Wheezing 11/14/2014  . Angioedema 03/19/2014  . Vaginitis 11/19/2012  . Smoker 11/19/2012  . Positive ANA (antinuclear antibody) 02/14/2012  . Polyarthritis 02/11/2012  . Migraine 04/05/2011  . Preventative health care 02/21/2011  . Allergic rhinitis 02/17/2011  . URTICARIA  09/29/2010  . Depression 09/14/2010  . Diabetes mellitus with neuropathy ([place]) 07/31/2010  . PERIPHERAL EDEMA 07/31/2010  . INSOMNIA-SLEEP DISORDER-UNSPEC 06/19/2010  . 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 MENORRHAGIA 05/15/2010  . Hyperlipidemia 12/17/2009  . Iron deficiency anemia 12/17/2009  . Anxiety state 12/17/2009  . Severe obesity (BMI >= 40) () 07/02/2007  . Gastroesophageal reflux disease 07/02/2007  . PEPTIC ULCER DISEASE 07/02/2007    Past Surgical History:  Procedure Laterality Date  . ABDOMINAL HYSTERECTOMY    . COLONOSCOPY WITH PROPOFOL N/A 10/19/2016   Procedure: COLONOSCOPY WITH PROPOFOL;  Surgeon: Mauri Pole, MD;  Location: [facility] ENDOSCOPY;  Service: Endoscopy;  Laterality: N/A;  . ESOPHAGOGASTRODUODENOSCOPY (EGD) WITH PROPOFOL N/A 10/19/2016   Procedure: ESOPHAGOGASTRODUODENOSCOPY (EGD) WITH PROPOFOL;  Surgeon: Mauri Pole, MD;  Location: [facility] ENDOSCOPY;  Service: Endoscopy;  Laterality: N/A;  . HERNIA MESH REMOVAL  02/2013  . uterine ablation  03/2010  . WISDOM TOOTH EXTRACTION      OB History    No data available       Home Medications    Prior to Admission medications   Medication Sig Start Date End Date Taking? Authorizing Provider  acetaminophen (TYLENOL) 500 MG tablet Take 1,000 mg by mouth every 6 (six) hours as needed for mild pain or moderate pain.    [provider]  alendronate (FOSAMAX) 70 MG tablet Take 1 tablet (70 mg total) by mouth every 7 (seven) days. 12/22/16   Lyndal Pulley, DO  ALPRAZolam Duanne Moron) 0.5 MG tablet Take 1 tablet (0.5 mg total) by mouth 3 (three) times daily as needed. 07/14/17   Biagio Borg, MD  azelastine (ASTELIN) 0.1 % nasal spray Place 2 sprays into both nostrils 2 (two) times daily. Use in each nostril as directed Patient not taking: Reported on 09/18/2017 03/11/16   Biagio Borg, MD  benzonatate (TESSALON) 200 MG capsule Take 1 capsule (200 mg total) by mouth 3 (three) times daily as needed for  cough. 09/22/17   Eugenie Filler, MD  Blood Glucose Monitoring Suppl (ONE TOUCH ULTRA 2) W/DEVICE KIT Use as directed four times daily. Dx: E11.9 08/11/15   Renato Shin, MD  budesonide-formoterol [facility]) 160-4.5 MCG/ACT inhaler Inhale 2 puffs into the lungs 2 (two) times daily. 06/30/16   Collene Gobble, MD  cetirizine (ZYRTEC) 10 MG tablet TAKE 1 TABLET(10 MG) BY MOUTH DAILY 08/24/17   Biagio Borg, MD  Cholecalciferol (VITAMIN D3 PO) Take 500 Units by mouth 3 (three) times daily.    [provider]  cyclobenzaprine (FLEXERIL) 5 MG tablet Take 2 tablets (10 mg total) by mouth at bedtime as needed for muscle spasms. 09/22/17   Eugenie Filler, MD  Diclofenac Sodium (PENNSAID) 2 % SOLN Apply 1 pump twice daily as needed. 08/10/17   Lyndal Pulley, DO  fluticasone (FLONASE) 50 MCG/ACT nasal spray Place 2 sprays into both nostrils daily. 09/23/17   Eugenie Filler, MD  folic acid (FOLVITE) 1 MG tablet TAKE 2 TABLETS BY MOUTH DAILY 08/23/17   Collene Gobble, MD  furosemide (LASIX) 40 MG tablet 1 tab by  mouth every day in the AM and then 1 as needed only for persistent swelling in the PM 06/16/16   Biagio Borg, MD  gabapentin (NEURONTIN) 300 MG capsule TAKE 1 CAPSULE(300 MG) BY MOUTH THREE TIMES DAILY 10/05/16   Biagio Borg, MD  glucose blood (ONE TOUCH ULTRA TEST) test strip Use to check blood sugars four times a day Dx E11.9 08/11/15   Renato Shin, MD  guaiFENesin (MUCINEX) 600 MG 12 hr tablet Take 2 tablets (1,200 mg total) by mouth 2 (two) times daily for 4 days. 09/22/17 09/26/17  Eugenie Filler, MD  HUMALOG 100 UNIT/ML injection INJECT UP TO 12 UNITS INTO THE SKIN THREE TIMES DAILY WITH MEALS WITH SLIDING SCALE AS DIRECTED 09/20/17   Biagio Borg, MD  Insulin Syringe-Needle U-100 25G X 1" 1 ML MISC Use to administer humalog insulin three times a day Dx E11.9 10/13/16   Biagio Borg, MD  levalbuterol [facility]) 45 MCG/ACT inhaler Inhale 1-2 puffs into the lungs  every 8 (eight) hours as needed for wheezing or shortness of breath. USE 2 PUFFS 3 TIMES DAILY X 5 DAYS THEN BACK TO HOME REGIMEN AS NEEDED. 09/22/17   Eugenie Filler, MD  levalbuterol [facility] HFA) 45 MCG/ACT inhaler INHALE 1-2 PUFFS INTO THE LUNGS TWICE DAILY 09/24/17   Biagio Borg, MD  levofloxacin (LEVAQUIN) 750 MG tablet Take 1 tablet (750 mg total) by mouth at bedtime for 4 days. 09/22/17 09/26/17  Eugenie Filler, MD  lidocaine (XYLOCAINE) 2 % solution Use as directed 20 mLs in the mouth or throat as needed for mouth pain. Patient not taking: Reported on 09/18/2017 01/04/17   Biagio Borg, MD  methotrexate (50 MG/ML) 1 g injection Inject 50 mg into the vein once.    [provider]  methotrexate (RHEUMATREX) 2.5 MG tablet Take 15 mg by mouth every Tuesday.     [provider]  naproxen (NAPROSYN) 500 MG tablet Take 1 tablet (500 mg total) by mouth 2 (two) times daily as needed. Patient not taking: Reported on 07/13/2017 06/14/17   Biagio Borg, MD  nystatin (MYCOSTATIN) 100000 UNIT/ML suspension Take 5 mLs (500,000 Units total) by mouth 4 (four) times daily for 5 days. 09/22/17 09/27/17  Eugenie Filler, MD  omeprazole (PRILOSEC) 40 MG capsule Take 1 capsule (40 mg total) by mouth daily. Patient not taking: Reported on 07/13/2017 06/15/17   Biagio Borg, MD  ondansetron (ZOFRAN-ODT) 8 MG disintegrating tablet Take 1 tablet (8 mg total) by mouth every 8 (eight) hours as needed for nausea or vomiting. Patient not taking: Reported on 09/18/2017 05/14/17   Abner Greenspan, MD  ONE TOUCH ULTRA TEST test strip TEST BLOOD SUGAR FOUR TIMES DAILY 12/22/16   Biagio Borg, MD  oxyCODONE-acetaminophen (PERCOCET) 10-325 MG tablet Take 1 tablet by mouth daily as needed for pain.    [provider]  potassium chloride (K-DUR) 10 MEQ tablet 3 tab by mouth per day only when taking the lasix Patient taking differently: Take 10-20 mEq by mouth 2 (two) times daily. Take 10 meq in  the morning and 31mq at night 06/17/16   JBiagio Borg MD  predniSONE (DELTASONE) 20 MG tablet Take 1-3 tablets (20-60 mg total) by mouth daily before breakfast. Take 3 tablets (63m x 1 day, then 2 tablets (4077mx 3 days, then 1 tablet (46m60maily x 3 days then stop. 09/23/17   ThomEugenie Filler  ranitidine (ZANKeturah Shavers  150 MG tablet TAKE 1 TABLET(150 MG) BY MOUTH AT BEDTIME Patient not taking: Reported on 07/13/2017 01/24/17   Mauri Pole, MD  rosuvastatin (CRESTOR) 20 MG tablet Take 1 tablet (20 mg total) by mouth daily. 06/15/17   Biagio Borg, MD  sertraline (ZOLOFT) 100 MG tablet TAKE 2 TABLETS BY MOUTH DAILY 05/11/17   Biagio Borg, MD  SUMAtriptan (IMITREX) 100 MG tablet Take 1 tablet by mouth at the onset of a headache. May repeat in 2 hours if headache persists or recurs. 10/13/16   Pieter Partridge, DO  telmisartan (MICARDIS) 40 MG tablet TAKE 1 TABLET(40 MG) BY MOUTH DAILY 05/11/17   Biagio Borg, MD  TURMERIC PO Take 1 tablet by mouth daily.    [provider]  vitamin B-12 (CYANOCOBALAMIN) 1000 MCG tablet Take 1,000 mcg by mouth daily.    [provider]  Vitamin D, Ergocalciferol, (DRISDOL) 50000 units CAPS capsule TAKE 1 CAPSULE BY MOUTH EVERY 7 DAYS 02/16/17   Lyndal Pulley, DO  zonisamide (ZONEGRAN) 100 MG capsule Take 1 capsule (100 mg total) by mouth daily. 02/23/17   Pieter Partridge, DO    Family History Family History  Problem Relation Age of Onset  . Allergies Mother   . Heart attack Mother   . Diabetes Mother   . Diabetes Father   . Heart disease Father   . Rheum arthritis Father   . Stroke Father   . Hypertension Unknown   . Ovarian cancer Maternal Aunt   . Lung cancer Maternal Aunt   . Breast cancer Maternal Aunt   . Bone cancer Maternal Aunt   . Stroke Maternal Uncle   . Other Neg Hx     Social History Social History   Tobacco Use  . Smoking status: Former Smoker    Packs/day: 1.00    Years: 20.00    Pack years: 20.00     Types: Cigarettes    Last attempt to quit: 10/25/2012    Years since quitting: 4.9  . Smokeless tobacco: Never Used  . Tobacco comment: QUIT 04/2010 AND STARTED BACK 2014 X 3 MONTHS. less than 1 ppd.  started at age 16.    Substance Use Topics  . Alcohol use: No  . Drug use: No     Allergies   Azithromycin; Bee venom; Flagyl [metronidazole]; Penicillins; Shellfish allergy; and Klonopin [clonazepam]   Review of Systems Review of Systems  All other systems reviewed and are negative.    Physical Exam Updated Vital Signs BP 115/72   Pulse 97   Temp 97.7 F (36.5 C) (Oral)   Resp (!) 22   LMP 07/17/2012   SpO2 97%   Physical Exam  Constitutional: She appears well-developed and well-nourished.  Obese female appears uncomfortable.  HENT:  Head: Atraumatic.  Mouth/Throat: No oropharyngeal exudate.  Eyes: Conjunctivae are normal.  Neck: Normal range of motion. Neck supple.  No nuchal rigidity  Cardiovascular: Normal rate and regular rhythm.  Pulmonary/Chest: No stridor. Tachypnea noted. She is in respiratory distress. She has decreased breath sounds. She has wheezes. She has rhonchi. She has no rales.  Abdominal: Soft. There is tenderness (Faint ecchymosis noted to left upper abdomen that is tender to palpation).  Neurological: She is alert.  Skin: No rash noted.  Psychiatric: She has a normal mood and affect.  Nursing note and vitals reviewed.    ED Treatments / Results  Labs (all labs ordered are listed, but only abnormal results are  displayed) Labs Reviewed  BASIC METABOLIC PANEL - Abnormal; Notable for the following components:      Result Value   Sodium 134 (*)    Potassium 3.3 (*)    Glucose, Bld 345 (*)    Calcium 8.7 (*)    All other components within normal limits  CBC - Abnormal; Notable for the following components:   WBC 13.7 (*)    All other components within normal limits  I-STAT TROPONIN, ED    EKG  EKG Interpretation  Date/Time:  Sunday  September 25 2017 08:50:49 EST Ventricular Rate:  97 PR Interval:    QRS Duration: 90 QT Interval:  355 QTC Calculation: 451 R Axis:   0 Text Interpretation:  Sinus rhythm Borderline short PR interval Indeterminate axis Baseline wander in lead(s) III No significant change since last tracing Abnormal ekg Confirmed by Carmin Muskrat [phone]) on 09/25/2017 8:54:43 AM Also confirmed by Carmin Muskrat ([id number]), editor Laurena Spies [phone])  on 09/25/2017 9:15:58 AM       Radiology Dg Chest 2 View  Result Date: 09/25/2017 CLINICAL DATA:  Shortness of breath and wheezing. EXAM: CHEST  2 VIEW COMPARISON:  09/18/2017 FINDINGS: The lungs are clear without focal pneumonia, edema, pneumothorax or pleural effusion. Diffuse interstitial opacity is seen bilaterally. No focal airspace consolidation or pulmonary edema. The cardiopericardial silhouette is within normal limits for size. The visualized bony structures of the thorax are intact. IMPRESSION: Stable without new or acute interval findings. Electronically Signed   By: Misty Stanley M.D.   On: 09/25/2017 09:10    Procedures Procedures (including critical care time)  Medications Ordered in ED Medications  methylPREDNISolone sodium succinate (SOLU-MEDROL) 125 mg/2 mL injection 125 mg (125 mg Intravenous Given 09/25/17 1024)  albuterol (PROVENTIL,VENTOLIN) solution continuous neb (10 mg/hr Nebulization Given 09/25/17 1005)  ipratropium (ATROVENT) nebulizer solution 0.5 mg (0.5 mg Nebulization Given 09/25/17 1006)  insulin aspart (novoLOG) injection 5 Units (5 Units Subcutaneous Given 09/25/17 1201)  magnesium sulfate IVPB 1 g 100 mL (1 g Intravenous New Bag/Given 09/25/17 1202)     Initial Impression / Assessment and Plan / ED Course  I have reviewed the triage vital signs and the nursing notes.  Pertinent labs & imaging results that were available during my care of the patient were reviewed by me and considered in my medical decision making (see  chart for details).     BP 115/72   Pulse 97   Temp 97.7 F (36.5 C) (Oral)   Resp (!) 22   LMP 07/17/2012   SpO2 96%    Final Clinical Impressions(s) / ED Diagnoses   Final diagnoses:  None    ED Discharge Orders    None     9:51 AM Patient with history of sarcoidosis, recently diagnosed with Norovirus discharge from the hospital 3 days ago now with worsening symptoms.  She does appears to be in some respiratory discomfort.  She has rhonchi's and wheezes on exam.  Will provide respiratory treatment and may consider admission due to her worsening condition.  11:31 AM Chest x-ray shows improved aeration without any new focal infiltrate.  EKG without acute ischemic changes, normal troponin, white count is mildly elevated at 13.7, CBG is 345, these are likely secondary to steroid use.  She has a normal anion gap.  She will receive a continuous nebs as well as steroid but reported no significant improvement.  We will give magnesium sulfate, and consider admission for the further managements of her respiratory discomfort.  Care discussed with Dr. Vanita Panda.   12:20 PM Appreciate consultation from [facility] Dr. Shanon Brow who agrees to see and admit pt for further management of her shortness of breath.     Domenic Moras, PA-C 09/25/17 1306    Carmin Muskrat, MD 09/25/17 [phone]

## 2017-09-25 NOTE — ED Notes (Signed)
Assigned 1518 @ 16:20 call report @ 16;40

## 2017-09-25 NOTE — ED Notes (Signed)
Patient ambulated with no any difficulties. Patient pulse oximetry while ambulating was 95-96% on RA.

## 2017-09-26 ENCOUNTER — Observation Stay (HOSPITAL_BASED_OUTPATIENT_CLINIC_OR_DEPARTMENT_OTHER): Payer: BLUE CROSS/BLUE SHIELD

## 2017-09-26 ENCOUNTER — Other Ambulatory Visit: Payer: Self-pay

## 2017-09-26 DIAGNOSIS — Z880 Allergy status to penicillin: Secondary | ICD-10-CM | POA: Diagnosis not present

## 2017-09-26 DIAGNOSIS — G43909 Migraine, unspecified, not intractable, without status migrainosus: Secondary | ICD-10-CM | POA: Diagnosis present

## 2017-09-26 DIAGNOSIS — D869 Sarcoidosis, unspecified: Secondary | ICD-10-CM

## 2017-09-26 DIAGNOSIS — F329 Major depressive disorder, single episode, unspecified: Secondary | ICD-10-CM | POA: Diagnosis present

## 2017-09-26 DIAGNOSIS — T380X5A Adverse effect of glucocorticoids and synthetic analogues, initial encounter: Secondary | ICD-10-CM | POA: Diagnosis present

## 2017-09-26 DIAGNOSIS — Z91030 Bee allergy status: Secondary | ICD-10-CM | POA: Diagnosis not present

## 2017-09-26 DIAGNOSIS — E119 Type 2 diabetes mellitus without complications: Secondary | ICD-10-CM | POA: Diagnosis present

## 2017-09-26 DIAGNOSIS — J069 Acute upper respiratory infection, unspecified: Secondary | ICD-10-CM

## 2017-09-26 DIAGNOSIS — F411 Generalized anxiety disorder: Secondary | ICD-10-CM | POA: Diagnosis present

## 2017-09-26 DIAGNOSIS — Z6841 Body Mass Index (BMI) 40.0 and over, adult: Secondary | ICD-10-CM | POA: Diagnosis not present

## 2017-09-26 DIAGNOSIS — Z87891 Personal history of nicotine dependence: Secondary | ICD-10-CM | POA: Diagnosis not present

## 2017-09-26 DIAGNOSIS — E785 Hyperlipidemia, unspecified: Secondary | ICD-10-CM | POA: Diagnosis present

## 2017-09-26 DIAGNOSIS — I5032 Chronic diastolic (congestive) heart failure: Secondary | ICD-10-CM | POA: Diagnosis not present

## 2017-09-26 DIAGNOSIS — J309 Allergic rhinitis, unspecified: Secondary | ICD-10-CM | POA: Diagnosis present

## 2017-09-26 DIAGNOSIS — Z7951 Long term (current) use of inhaled steroids: Secondary | ICD-10-CM | POA: Diagnosis not present

## 2017-09-26 DIAGNOSIS — Z881 Allergy status to other antibiotic agents status: Secondary | ICD-10-CM | POA: Diagnosis not present

## 2017-09-26 DIAGNOSIS — R0600 Dyspnea, unspecified: Secondary | ICD-10-CM

## 2017-09-26 DIAGNOSIS — K219 Gastro-esophageal reflux disease without esophagitis: Secondary | ICD-10-CM | POA: Diagnosis present

## 2017-09-26 DIAGNOSIS — D860 Sarcoidosis of lung: Secondary | ICD-10-CM | POA: Diagnosis present

## 2017-09-26 DIAGNOSIS — J209 Acute bronchitis, unspecified: Secondary | ICD-10-CM | POA: Diagnosis present

## 2017-09-26 DIAGNOSIS — Z794 Long term (current) use of insulin: Secondary | ICD-10-CM | POA: Diagnosis not present

## 2017-09-26 DIAGNOSIS — R0602 Shortness of breath: Secondary | ICD-10-CM | POA: Diagnosis present

## 2017-09-26 DIAGNOSIS — M069 Rheumatoid arthritis, unspecified: Secondary | ICD-10-CM | POA: Diagnosis present

## 2017-09-26 DIAGNOSIS — Z8711 Personal history of peptic ulcer disease: Secondary | ICD-10-CM | POA: Diagnosis not present

## 2017-09-26 DIAGNOSIS — D72829 Elevated white blood cell count, unspecified: Secondary | ICD-10-CM | POA: Diagnosis present

## 2017-09-26 DIAGNOSIS — I110 Hypertensive heart disease with heart failure: Secondary | ICD-10-CM | POA: Diagnosis present

## 2017-09-26 LAB — CBC
HCT: 36.9 % (ref 36.0–46.0)
HEMOGLOBIN: 12.5 g/dL (ref 12.0–15.0)
MCH: 29 pg (ref 26.0–34.0)
MCHC: 33.9 g/dL (ref 30.0–36.0)
MCV: 85.6 fL (ref 78.0–100.0)
PLATELETS: 279 10*3/uL (ref 150–400)
RBC: 4.31 MIL/uL (ref 3.87–5.11)
RDW: 13.8 % (ref 11.5–15.5)
WBC: 22.5 10*3/uL — ABNORMAL HIGH (ref 4.0–10.5)

## 2017-09-26 LAB — GLUCOSE, CAPILLARY
GLUCOSE-CAPILLARY: 237 mg/dL — AB (ref 65–99)
GLUCOSE-CAPILLARY: 279 mg/dL — AB (ref 65–99)
GLUCOSE-CAPILLARY: 183 mg/dL — AB (ref 65–99)
Glucose-Capillary: 181 mg/dL — ABNORMAL HIGH (ref 65–99)

## 2017-09-26 LAB — BASIC METABOLIC PANEL
Anion gap: 10 (ref 5–15)
BUN: 19 mg/dL (ref 6–20)
CALCIUM: 8.9 mg/dL (ref 8.9–10.3)
CHLORIDE: 102 mmol/L (ref 101–111)
CO2: 23 mmol/L (ref 22–32)
CREATININE: 0.87 mg/dL (ref 0.44–1.00)
GFR calc non Af Amer: >60 mL/min (ref >60)
GLUCOSE: 284 mg/dL — AB (ref 65–99)
Potassium: 3.9 mmol/L (ref 3.5–5.1)
Sodium: 135 mmol/L (ref 135–145)

## 2017-09-26 LAB — ECHOCARDIOGRAM COMPLETE

## 2017-09-26 NOTE — Progress Notes (Signed)
[facility]  PROGRESS NOTE  Nicole Melendez RSW:[id number] DOB: 1976-04-16 DOA: 09/25/2017 PCP: Biagio Borg, MD   Brief HPI:   41 y.o. female with medical history significant of sarcoidosis, dm, obesity just d/c a couple of days ago for a URI sent home on levaquin and prednisone taper with nebs comes in with still not feeling well.  She reports she is taking her steroids and levaquin.  Doing her nebs at home.  Still sob and coughing a lot along with wheezing.  No fevers.  No n/v/d.  No swelling in her legs.  Pt referred for admission for sarcoidosis flare.      Subjective   Patient seen and examined, breathing has improved.  Echocardiogram done today.   Assessment/Plan:     1. Acute bronchitis-respiratory panel showed rhinovirus last week.  Started on Solu-Medrol 60 mg IV every 12 hours.  Levaquin 750 mg daily, Mucinex 1 tablet p.o. twice daily. 2. Sarcoidosis-continue IV steroids 3. Hypertension-continue home medications 4. Current diastolic CHF-euvolemic at this time.  Echocardiogram shows no diastolic dysfunction. 5. Depression-stable, continue Zoloft.    DVT prophylaxis: SCD  Code Status: Full code  Family Communication: No family at bedside  Disposition Plan: Home in next 1-2 days   Consultants:  None   Procedures:  None   Continuous infusions . sodium chloride        Antibiotics:   Anti-infectives (From admission, onward)   Start     Dose/Rate Route Frequency Ordered Stop   09/25/17 2200  levofloxacin (LEVAQUIN) tablet 750 mg     750 mg Oral Daily at bedtime 09/25/17 2048         Objective   Vitals:   09/26/17 0440 09/26/17 0947 09/26/17 1427 09/26/17 1440  BP: 139/62  (!) 110/56   Pulse: 86  72   Resp: 16  18   Temp: (!) 97.5 F (36.4 C)  98.8 F (37.1 C)   TempSrc: Oral  Oral   SpO2: 90% 95% 100% 99%    Intake/Output Summary (Last 24 hours) at 09/26/2017 1739 Last data filed at 09/26/2017 1430 Gross per 24 hour  Intake 723 ml   Output -  Net 723 ml   There were no vitals filed for this visit.   Physical Examination:   Physical Exam: Eyes: No icterus, extraocular muscles intact  Mouth: Oral mucosa is moist, no lesions on palate,  Neck: Supple, no deformities, masses, or tenderness Lungs: Normal respiratory effort, bilateral clear to auscultation, no crackles or wheezes.  Heart: Regular rate and rhythm, S1 and S2 normal, no murmurs, rubs auscultated Abdomen: BS normoactive,soft,nondistended,non-tender to palpation,no organomegaly Extremities: No pretibial edema, no erythema, no cyanosis, no clubbing Neuro : Alert and oriented to time, place and person, No focal deficits  Skin: No rashes seen on exam     Data Reviewed: I have personally reviewed following labs and imaging studies  CBG: Recent Labs  Lab 09/22/17 1121 09/25/17 2052 09/26/17 0728 09/26/17 1213 09/26/17 1624  GLUCAP 204* 256* 237* 181* 279*    CBC: Recent Labs  Lab 09/22/17 0524 09/25/17 1000 09/26/17 0547  WBC 14.7* 13.7* 22.5*  NEUTROABS 13.1*  --   --   HGB 12.3 12.6 12.5  HCT 36.4 37.5 36.9  MCV 85.8 86.0 85.6  PLT 261 266 093    Basic Metabolic Panel: Recent Labs  Lab 09/22/17 0524 09/25/17 1000 09/26/17 0547  NA 139 134* 135  K 4.0 3.3* 3.9  CL 104 101 102  CO2 27 24 23   GLUCOSE 194* 345* 284*  BUN 19 19 19   CREATININE 0.76 0.97 0.87  CALCIUM 9.2 8.7* 8.9    Recent Results (from the past 240 hour(s))  Culture, blood (routine x 2)     Status: None   Collection Time: 09/18/17  8:18 PM  Result Value Ref Range Status   Specimen Description BLOOD RIGHT HAND  Final   Special Requests   Final    BOTTLES DRAWN AEROBIC AND ANAEROBIC Blood Culture adequate volume   Culture   Final    NO GROWTH 5 DAYS Performed at [facility], [place] [address] [place], [place] [address]    Report Status 09/24/2017 FINAL  Final  Culture, blood (routine x 2)     Status: None   Collection Time: 09/18/17  8:36 PM   Result Value Ref Range Status   Specimen Description BLOOD LEFT HAND  Final   Special Requests   Final    BOTTLES DRAWN AEROBIC AND ANAEROBIC Blood Culture adequate volume   Culture   Final    NO GROWTH 5 DAYS Performed at [facility], [place] [address], [place], [place] [address]    Report Status 09/24/2017 FINAL  Final  Respiratory Panel by PCR     Status: Abnormal   Collection Time: 09/18/17 11:34 PM  Result Value Ref Range Status   Adenovirus NOT DETECTED NOT DETECTED Final   Coronavirus 229E NOT DETECTED NOT DETECTED Final   Coronavirus HKU1 NOT DETECTED NOT DETECTED Final   Coronavirus NL63 NOT DETECTED NOT DETECTED Final   Coronavirus OC43 NOT DETECTED NOT DETECTED Final   Metapneumovirus NOT DETECTED NOT DETECTED Final   Rhinovirus / Enterovirus DETECTED (A) NOT DETECTED Final   Influenza A NOT DETECTED NOT DETECTED Final   Influenza B NOT DETECTED NOT DETECTED Final   Parainfluenza Virus 1 NOT DETECTED NOT DETECTED Final   Parainfluenza Virus 2 NOT DETECTED NOT DETECTED Final   Parainfluenza Virus 3 NOT DETECTED NOT DETECTED Final   Parainfluenza Virus 4 NOT DETECTED NOT DETECTED Final   Respiratory Syncytial Virus NOT DETECTED NOT DETECTED Final   Bordetella pertussis NOT DETECTED NOT DETECTED Final   Chlamydophila pneumoniae NOT DETECTED NOT DETECTED Final   Mycoplasma pneumoniae NOT DETECTED NOT DETECTED Final    Comment: Performed at [facility], [place] [address] [place], [place] [address]  Culture, sputum-assessment     Status: None   Collection Time: 09/19/17  2:30 AM  Result Value Ref Range Status   Specimen Description EXPECTORATED SPUTUM  Final   Special Requests Immunocompromised  Final   Sputum evaluation THIS SPECIMEN IS ACCEPTABLE FOR SPUTUM CULTURE  Final   Report Status 09/19/2017 FINAL  Final  Culture, respiratory (NON-Expectorated)     Status: None   Collection Time: 09/19/17  2:30 AM  Result Value Ref Range Status   Specimen Description  EXPECTORATED SPUTUM  Final   Special Requests Immunocompromised Reflexed from [id number]  Final   Gram Stain   Final    FEW WBC PRESENT, PREDOMINANTLY PMN RARE SQUAMOUS EPITHELIAL CELLS PRESENT RARE GRAM POSITIVE COCCI IN PAIRS RARE GRAM POSITIVE RODS    Culture   Final    Consistent with normal respiratory flora. Performed at [facility], [place] [address], [place], [place] [address]    Report Status 09/21/2017 FINAL  Final     Liver Function Tests: No results for input(s): AST, ALT, ALKPHOS, BILITOT, PROT, ALBUMIN in the last  168 hours. No results for input(s): LIPASE, AMYLASE in the last 168 hours. No results for input(s): AMMONIA in the last 168 hours.  Cardiac Enzymes: No results for input(s): CKTOTAL, CKMB, CKMBINDEX, TROPONINI in the last 168 hours. BNP (last 3 results) No results for input(s): BNP in the last 8760 hours.  ProBNP (last 3 results) No results for input(s): PROBNP in the last 8760 hours.    Studies: Dg Chest 2 View  Result Date: 09/25/2017 CLINICAL DATA:  Shortness of breath and wheezing. EXAM: CHEST  2 VIEW COMPARISON:  09/18/2017 FINDINGS: The lungs are clear without focal pneumonia, edema, pneumothorax or pleural effusion. Diffuse interstitial opacity is seen bilaterally. No focal airspace consolidation or pulmonary edema. The cardiopericardial silhouette is within normal limits for size. The visualized bony structures of the thorax are intact. IMPRESSION: Stable without new or acute interval findings. Electronically Signed   By: Misty Stanley M.D.   On: 09/25/2017 09:10    Scheduled Meds: . fluticasone  2 spray Each Nare Daily  . folic acid  2 mg Oral Daily  . furosemide  40 mg Oral Daily  . guaiFENesin  1,200 mg Oral BID  . insulin aspart  0-9 Units Subcutaneous TID WC  . insulin aspart  12 Units Subcutaneous TID WC  . ipratropium-albuterol  3 mL Nebulization TID  . levofloxacin  750 mg Oral QHS  . methylPREDNISolone (SOLU-MEDROL) injection  60  mg Intravenous Q12H  . nystatin  5 mL Oral QID  . rosuvastatin  20 mg Oral Daily  . sertraline  200 mg Oral Daily  . sodium chloride flush  3 mL Intravenous Q12H  . vitamin B-12  1,000 mcg Oral Daily  . zonisamide  100 mg Oral Daily      Time spent: 25 min  [facility] Pager [phone]. If 7PM-7AM, please contact night-coverage at [email or web address], Office  [phone]  password [email or web address]  09/26/2017, 5:39 PM  LOS: 0 days

## 2017-09-26 NOTE — Progress Notes (Signed)
  Echocardiogram 2D Echocardiogram has been performed.  Nyella Eckels L Androw 09/26/2017, 2:13 PM

## 2017-09-26 NOTE — Progress Notes (Signed)
Date:  September 26, 2017 Chart reviewed for concurrent status and case management needs.  Will continue to follow patient progress.  Discharge Planning: following for needs  Expected discharge date: September 29, 2017  Annmarie Plemmons, BSN, RN3, CCM   [phone]  

## 2017-09-26 NOTE — Progress Notes (Signed)
Report called to nurse, Delana Meyer at [facility].

## 2017-09-27 ENCOUNTER — Inpatient Hospital Stay: Admission: RE | Admit: 2017-09-27 | Payer: Self-pay | Source: Ambulatory Visit

## 2017-09-27 LAB — CBC
HCT: 36.3 % (ref 36.0–46.0)
HEMOGLOBIN: 12.2 g/dL (ref 12.0–15.0)
MCH: 28.9 pg (ref 26.0–34.0)
MCHC: 33.6 g/dL (ref 30.0–36.0)
MCV: 86 fL (ref 78.0–100.0)
Platelets: 280 10*3/uL (ref 150–400)
RBC: 4.22 MIL/uL (ref 3.87–5.11)
RDW: 13.8 % (ref 11.5–15.5)
WBC: 18.1 10*3/uL — ABNORMAL HIGH (ref 4.0–10.5)

## 2017-09-27 LAB — BASIC METABOLIC PANEL
ANION GAP: 7 (ref 5–15)
BUN: 20 mg/dL (ref 6–20)
CHLORIDE: 102 mmol/L (ref 101–111)
CO2: 26 mmol/L (ref 22–32)
Calcium: 9 mg/dL (ref 8.9–10.3)
Creatinine, Ser: 0.85 mg/dL (ref 0.44–1.00)
GFR calc non Af Amer: >60 mL/min (ref >60)
Glucose, Bld: 228 mg/dL — ABNORMAL HIGH (ref 65–99)
POTASSIUM: 4.4 mmol/L (ref 3.5–5.1)
SODIUM: 135 mmol/L (ref 135–145)

## 2017-09-27 LAB — GLUCOSE, CAPILLARY: GLUCOSE-CAPILLARY: 217 mg/dL — AB (ref 65–99)

## 2017-09-27 MED ORDER — GUAIFENESIN ER 600 MG PO TB12: 600.0000 mg | ORAL_TABLET | Freq: Two times a day (BID) | ORAL | 0 refills | Status: AC

## 2017-09-27 MED ORDER — PREDNISONE 10 MG PO TABS: ORAL_TABLET | ORAL | 0 refills | Status: DC

## 2017-09-27 NOTE — Progress Notes (Addendum)
Inpatient Diabetes Program Recommendations  AACE/ADA: New Consensus Statement on Inpatient Glycemic Control (2015)  Target Ranges:  Prepandial:   less than 140 mg/dL      Peak postprandial:   less than 180 mg/dL (1-2 hours)      Critically ill patients:  140 - 180 mg/dL   Results for Nicole Melendez, Nicole Melendez (MRN [id number]) as of 09/27/2017 09:40  Ref. Range 09/26/2017 07:28 09/26/2017 12:13 09/26/2017 16:24 09/26/2017 22:03  Glucose-Capillary Latest Ref Range: 65 - 99 mg/dL 237 (H) 181 (H) 279 (H) 183 (H)   Results for Nicole Melendez, Nicole Melendez (MRN [id number]) as of 09/27/2017 09:40  Ref. Range 09/27/2017 07:23  Glucose-Capillary Latest Ref Range: 65 - 99 mg/dL 217 (H)    Home DM Meds: Humalog up to 12 units TID per SSI  Current Orders: Novolog Sensitive Correction Scale/ SSI (0-9 units) TID AC       Novolog 12 units TID        MD- Note patient getting Solumedrol 60 mg BID.  Please consider starting low dose Lantus as well: Lantus 14 units daily (0.1 units/kg dosing)      --Will follow patient during hospitalization--  Wyn Quaker RN, MSN, CDE Diabetes Coordinator Inpatient Glycemic Control Team Team Pager: [phone] (8a-5p)

## 2017-09-27 NOTE — Progress Notes (Signed)
Physician Discharge Summary  Nicole Melendez PHX:[id number] DOB: 29-Sep-1976 DOA: 09/25/2017  PCP: Biagio Borg, MD  Admit date: 09/25/2017 Discharge date: 09/27/2017  Time spent: 35* minutes  Recommendations for Outpatient Follow-up:  1. Follow up PCP in 2 weeks   Discharge Diagnoses:  Principal Problem:   URI (upper respiratory infection) Active Problems:   PULMONARY SARCOIDOSIS   Anxiety state   Acute upper respiratory infection   Essential hypertension   DOE (dyspnea on exertion)   Polyarthralgia   Rheumatoid arthritis (HCC)   Chronic diastolic CHF (congestive heart failure) ([place])   Discharge Condition: Stable  Diet recommendation: Heart healthy diet  Filed Weights   09/27/17 0502  Weight: (!) 144.5 kg (318 lb 9 oz)    History of present illness:  41 y.o.femalewith medical history significant ofsarcoidosis, dm, obesity just d/c a couple of days ago for a URI sent home on levaquin and prednisone taper with nebs comes in with still not feeling well. She reports she is taking her steroids and levaquin. Doing her nebs at home. Still sob and coughing a lot along with wheezing. No fevers. No n/v/d. No swelling in her legs. Pt referred for admission for sarcoidosis flare.     Hospital Course:  1. Acute bronchitis-respiratory panel showed rhinovirus last week.  Started on Solu-Medrol 60 mg IV every 12 hours.  Levaquin 750 mg daily, Mucinex 1 tablet p.o. twice daily. She has improved, will discharge on Prednisone taper, Mucinex. 2. Sarcoidosis- stable 3. Hypertension-continue home medications 4. Current diastolic CHF-euvolemic at this time.  Echocardiogram shows no diastolic dysfunction. 5. Depression-stable, continue Zoloft.     Procedures:  Echo  Consultations:  None   Discharge Exam: Vitals:   09/27/17 0502 09/27/17 0812  BP: (!) 130/54   Pulse: 68   Resp: 19   Temp: 97.9 F (36.6 C)   SpO2: 99% 97%    General: Appears in no acute  distress Cardiovascular: S1S2, RRR Respiratory: Clear bilaterally  Discharge Instructions   Discharge Instructions    Diet - low sodium heart healthy   Complete by:  As directed    Increase activity slowly   Complete by:  As directed      Allergies as of 09/27/2017      Reactions   Azithromycin Hives   (z pak) hives   Bee Venom Anaphylaxis   Flagyl [metronidazole] Hives, Shortness Of Breath   Penicillins Shortness Of Breath, Swelling   Has patient had a PCN reaction causing immediate rash, facial/tongue/throat swelling, SOB or lightheadedness with hypotension: Yes Has patient had a PCN reaction causing severe rash involving mucus membranes or skin necrosis: No Has patient had a PCN reaction that required hospitalization No Has patient had a PCN reaction occurring within the last 10 years: No If all of the above answers are "NO", then may proceed with Cephalosporin use. REACTION: swelling and difficulty breathing   Shellfish Allergy Anaphylaxis   Klonopin [clonazepam] Other (See Comments)   Memory difficulty      Medication List    STOP taking these medications   acetaminophen 500 MG tablet Commonly known as:  TYLENOL   levofloxacin 750 MG tablet Commonly known as:  LEVAQUIN   naproxen 500 MG tablet Commonly known as:  NAPROSYN   VITAMIN D3 PO     TAKE these medications   alendronate 70 MG tablet Commonly known as:  FOSAMAX Take 1 tablet (70 mg total) by mouth every 7 (seven) days.   ALPRAZolam 0.5 MG tablet Commonly  known as:  XANAX Take 1 tablet (0.5 mg total) by mouth 3 (three) times daily as needed.   azelastine 0.1 % nasal spray Commonly known as:  ASTELIN Place 2 sprays into both nostrils 2 (two) times daily. Use in each nostril as directed   benzonatate 200 MG capsule Commonly known as:  TESSALON Take 1 capsule (200 mg total) by mouth 3 (three) times daily as needed for cough.   budesonide-formoterol 160-4.5 MCG/ACT inhaler Commonly known as:   SYMBICORT Inhale 2 puffs into the lungs 2 (two) times daily.   cetirizine 10 MG tablet Commonly known as:  ZYRTEC TAKE 1 TABLET(10 MG) BY MOUTH DAILY   cyclobenzaprine 5 MG tablet Commonly known as:  FLEXERIL Take 2 tablets (10 mg total) by mouth at bedtime as needed for muscle spasms.   Diclofenac Sodium 2 % Soln Commonly known as:  PENNSAID Apply 1 pump twice daily as needed.   fluticasone 50 MCG/ACT nasal spray Commonly known as:  FLONASE Place 2 sprays into both nostrils daily.   folic acid 1 MG tablet Commonly known as:  FOLVITE TAKE 2 TABLETS BY MOUTH DAILY   furosemide 40 MG tablet Commonly known as:  LASIX 1 tab by mouth every day in the AM and then 1 as needed only for persistent swelling in the PM   gabapentin 300 MG capsule Commonly known as:  NEURONTIN TAKE 1 CAPSULE(300 MG) BY MOUTH THREE TIMES DAILY   glucose blood test strip Commonly known as:  ONE TOUCH ULTRA TEST Use to check blood sugars four times a day Dx E11.9   ONE TOUCH ULTRA TEST test strip Generic drug:  glucose blood TEST BLOOD SUGAR FOUR TIMES DAILY   guaiFENesin 600 MG 12 hr tablet Commonly known as:  MUCINEX Take 1 tablet (600 mg total) by mouth 2 (two) times daily for 10 days. What changed:  how much to take   HUMALOG 100 UNIT/ML injection Generic drug:  insulin lispro INJECT UP TO 12 UNITS INTO THE SKIN THREE TIMES DAILY WITH MEALS WITH SLIDING SCALE AS DIRECTED   Insulin Syringe-Needle U-100 25G X 1" 1 ML Misc Use to administer humalog insulin three times a day Dx E11.9   levalbuterol 45 MCG/ACT inhaler Commonly known as:  XOPENEX HFA Inhale 1-2 puffs into the lungs every 8 (eight) hours as needed for wheezing or shortness of breath. USE 2 PUFFS 3 TIMES DAILY X 5 DAYS THEN BACK TO HOME REGIMEN AS NEEDED. What changed:  Another medication with the same name was removed. Continue taking this medication, and follow the directions you see here.   lidocaine 2 % solution Commonly  known as:  XYLOCAINE Use as directed 20 mLs in the mouth or throat as needed for mouth pain.   methotrexate 2.5 MG tablet Commonly known as:  RHEUMATREX Take 15 mg by mouth every Tuesday. What changed:  Another medication with the same name was removed. Continue taking this medication, and follow the directions you see here.   nystatin 100000 UNIT/ML suspension Commonly known as:  MYCOSTATIN Take 5 mLs (500,000 Units total) by mouth 4 (four) times daily for 5 days.   omeprazole 40 MG capsule Commonly known as:  PRILOSEC Take 1 capsule (40 mg total) by mouth daily.   ondansetron 8 MG disintegrating tablet Commonly known as:  ZOFRAN-ODT Take 1 tablet (8 mg total) by mouth every 8 (eight) hours as needed for nausea or vomiting.   ONE TOUCH ULTRA 2 w/Device Kit Use as directed four  times daily. Dx: E11.9   oxyCODONE-acetaminophen 10-325 MG tablet Commonly known as:  PERCOCET Take 1 tablet by mouth daily as needed for pain.   potassium chloride 10 MEQ tablet Commonly known as:  K-DUR 3 tab by mouth per day only when taking the lasix What changed:    how much to take  how to take this  when to take this  additional instructions   predniSONE 10 MG tablet Commonly known as:  DELTASONE Prednisone 40 mg po daily x 2 day then Prednisone 30 mg po daily x 2 day then Prednisone 20 mg po daily x 2 day then Prednisone 10 mg daily x 2 day then stop... What changed:    medication strength  how much to take  how to take this  when to take this  additional instructions   ranitidine 150 MG tablet Commonly known as:  ZANTAC TAKE 1 TABLET(150 MG) BY MOUTH AT BEDTIME   rosuvastatin 20 MG tablet Commonly known as:  CRESTOR Take 1 tablet (20 mg total) by mouth daily.   sertraline 100 MG tablet Commonly known as:  ZOLOFT TAKE 2 TABLETS BY MOUTH DAILY   SUMAtriptan 100 MG tablet Commonly known as:  IMITREX Take 1 tablet by mouth at the onset of a headache. May repeat in 2  hours if headache persists or recurs.   telmisartan 40 MG tablet Commonly known as:  MICARDIS TAKE 1 TABLET(40 MG) BY MOUTH DAILY   TURMERIC PO Take 1 tablet by mouth daily.   vitamin B-12 1000 MCG tablet Commonly known as:  CYANOCOBALAMIN Take 1,000 mcg by mouth daily.   Vitamin D (Ergocalciferol) 50000 units Caps capsule Commonly known as:  DRISDOL TAKE 1 CAPSULE BY MOUTH EVERY 7 DAYS   zonisamide 100 MG capsule Commonly known as:  ZONEGRAN Take 1 capsule (100 mg total) by mouth daily.      Allergies  Allergen Reactions  . Azithromycin Hives    (z pak) hives  . Bee Venom Anaphylaxis  . Flagyl [Metronidazole] Hives and Shortness Of Breath  . Penicillins Shortness Of Breath and Swelling    Has patient had a PCN reaction causing immediate rash, facial/tongue/throat swelling, SOB or lightheadedness with hypotension: Yes Has patient had a PCN reaction causing severe rash involving mucus membranes or skin necrosis: No Has patient had a PCN reaction that required hospitalization No Has patient had a PCN reaction occurring within the last 10 years: No If all of the above answers are "NO", then may proceed with Cephalosporin use.  REACTION: swelling and difficulty breathing  . Shellfish Allergy Anaphylaxis  . Klonopin [Clonazepam] Other (See Comments)    Memory difficulty      The results of significant diagnostics from this hospitalization (including imaging, microbiology, ancillary and laboratory) are listed below for reference.    Significant Diagnostic Studies: Dg Chest 2 View  Result Date: 09/25/2017 CLINICAL DATA:  Shortness of breath and wheezing. EXAM: CHEST  2 VIEW COMPARISON:  09/18/2017 FINDINGS: The lungs are clear without focal pneumonia, edema, pneumothorax or pleural effusion. Diffuse interstitial opacity is seen bilaterally. No focal airspace consolidation or pulmonary edema. The cardiopericardial silhouette is within normal limits for size. The visualized  bony structures of the thorax are intact. IMPRESSION: Stable without new or acute interval findings. Electronically Signed   By: Misty Stanley M.D.   On: 09/25/2017 09:10   Dg Chest 2 View  Result Date: 09/18/2017 CLINICAL DATA:  41 year old female with cough, congestion, shortness of breath. History of  sarcoidosis. EXAM: CHEST  2 VIEW COMPARISON:  11/21/2015 FINDINGS: The heart size and mediastinal contours are within normal limits. There is prominence of the right hilum, similar to examination. Vague right basilar opacities suggesting atelectasis versus early infiltrate. The left lung is clear. No sizeable effusions or pneumothorax. The visualized skeletal structures are unremarkable. IMPRESSION: Right basilar atelectasis versus early infiltrate. Follow-up to resolution recommended. Electronically Signed   By: Kristopher Oppenheim M.D.   On: 09/18/2017 18:08    Microbiology: Recent Results (from the past 240 hour(s))  Culture, blood (routine x 2)     Status: None   Collection Time: 09/18/17  8:18 PM  Result Value Ref Range Status   Specimen Description BLOOD RIGHT HAND  Final   Special Requests   Final    BOTTLES DRAWN AEROBIC AND ANAEROBIC Blood Culture adequate volume   Culture   Final    NO GROWTH 5 DAYS Performed at [facility], [address] [place], [place] [address]    Report Status 09/24/2017 FINAL  Final  Culture, blood (routine x 2)     Status: None   Collection Time: 09/18/17  8:36 PM  Result Value Ref Range Status   Specimen Description BLOOD LEFT HAND  Final   Special Requests   Final    BOTTLES DRAWN AEROBIC AND ANAEROBIC Blood Culture adequate volume   Culture   Final    NO GROWTH 5 DAYS Performed at [facility], [place] [address], [place], [place] [address]    Report Status 09/24/2017 FINAL  Final  Respiratory Panel by PCR     Status: Abnormal   Collection Time: 09/18/17 11:34 PM  Result Value Ref Range Status   Adenovirus NOT DETECTED NOT DETECTED Final    Coronavirus 229E NOT DETECTED NOT DETECTED Final   Coronavirus HKU1 NOT DETECTED NOT DETECTED Final   Coronavirus NL63 NOT DETECTED NOT DETECTED Final   Coronavirus OC43 NOT DETECTED NOT DETECTED Final   Metapneumovirus NOT DETECTED NOT DETECTED Final   Rhinovirus / Enterovirus DETECTED (A) NOT DETECTED Final   Influenza A NOT DETECTED NOT DETECTED Final   Influenza B NOT DETECTED NOT DETECTED Final   Parainfluenza Virus 1 NOT DETECTED NOT DETECTED Final   Parainfluenza Virus 2 NOT DETECTED NOT DETECTED Final   Parainfluenza Virus 3 NOT DETECTED NOT DETECTED Final   Parainfluenza Virus 4 NOT DETECTED NOT DETECTED Final   Respiratory Syncytial Virus NOT DETECTED NOT DETECTED Final   Bordetella pertussis NOT DETECTED NOT DETECTED Final   Chlamydophila pneumoniae NOT DETECTED NOT DETECTED Final   Mycoplasma pneumoniae NOT DETECTED NOT DETECTED Final    Comment: Performed at [facility], [place] [address], [place], [place] [address]  Culture, sputum-assessment     Status: None   Collection Time: 09/19/17  2:30 AM  Result Value Ref Range Status   Specimen Description EXPECTORATED SPUTUM  Final   Special Requests Immunocompromised  Final   Sputum evaluation THIS SPECIMEN IS ACCEPTABLE FOR SPUTUM CULTURE  Final   Report Status 09/19/2017 FINAL  Final  Culture, respiratory (NON-Expectorated)     Status: None   Collection Time: 09/19/17  2:30 AM  Result Value Ref Range Status   Specimen Description EXPECTORATED SPUTUM  Final   Special Requests Immunocompromised Reflexed from [id number]  Final   Gram Stain   Final    FEW WBC PRESENT, PREDOMINANTLY PMN RARE SQUAMOUS EPITHELIAL CELLS PRESENT RARE GRAM POSITIVE COCCI IN PAIRS RARE GRAM POSITIVE RODS  Culture   Final    Consistent with normal respiratory flora. Performed at [facility], [place] [address], [place], [place] [address]    Report Status 09/21/2017 FINAL  Final     Labs: Basic Metabolic Panel: Recent Labs  Lab  09/22/17 0524 09/25/17 1000 09/26/17 0547 09/27/17 0604  NA 139 134* 135 135  K 4.0 3.3* 3.9 4.4  CL 104 101 102 102  CO2 _0 GLUCOSE 194* 345* 284* 228*  BUN _1 CREATININE 0.76 0.97 0.87 0.85  CALCIUM 9.2 8.7* 8.9 9.0   Liver Function Tests: No results for input(s): AST, ALT, ALKPHOS, BILITOT, PROT, ALBUMIN in the last 168 hours. No results for input(s): LIPASE, AMYLASE in the last 168 hours. No results for input(s): AMMONIA in the last 168 hours. CBC: Recent Labs  Lab 09/22/17 0524 09/25/17 1000 09/26/17 0547 09/27/17 0604  WBC 14.7* 13.7* 22.5* 18.1*  NEUTROABS 13.1*  --   --   --   HGB 12.3 12.6 12.5 12.2  HCT 36.4 37.5 36.9 36.3  MCV 85.8 86.0 85.6 86.0  PLT 261 266 279 280    CBG: Recent Labs  Lab 09/26/17 0728 09/26/17 1213 09/26/17 1624 09/26/17 2203 09/27/17 0723  GLUCAP 237* 181* 279* 183* 217*       Signed:  Oswald Hillock MD.  [facility] 09/27/2017, 10:23 AM

## 2017-09-27 NOTE — Progress Notes (Signed)
Patient given discharge instructions, and verbalized an understanding of all discharge instructions.  Patient agrees with discharge plan, and is being discharged in stable medical condition.  Patient given transportation via wheelchair. 

## 2017-09-28 ENCOUNTER — Ambulatory Visit (INDEPENDENT_AMBULATORY_CARE_PROVIDER_SITE_OTHER): Payer: BLUE CROSS/BLUE SHIELD | Admitting: Acute Care

## 2017-09-28 ENCOUNTER — Encounter: Payer: Self-pay | Admitting: Acute Care

## 2017-09-28 VITALS — BP 124/66 | HR 97 | Ht 67.0 in | Wt 320.6 lb

## 2017-09-28 DIAGNOSIS — J181 Lobar pneumonia, unspecified organism: Secondary | ICD-10-CM | POA: Diagnosis not present

## 2017-09-28 DIAGNOSIS — D860 Sarcoidosis of lung: Secondary | ICD-10-CM | POA: Diagnosis not present

## 2017-09-28 DIAGNOSIS — B370 Candidal stomatitis: Secondary | ICD-10-CM

## 2017-09-28 DIAGNOSIS — J069 Acute upper respiratory infection, unspecified: Secondary | ICD-10-CM | POA: Diagnosis not present

## 2017-09-28 DIAGNOSIS — D869 Sarcoidosis, unspecified: Secondary | ICD-10-CM

## 2017-09-28 DIAGNOSIS — J189 Pneumonia, unspecified organism: Secondary | ICD-10-CM

## 2017-09-28 MED ORDER — DEXTROMETHORPHAN POLISTIREX ER 30 MG/5ML PO SUER: 30.0000 mg | Freq: Two times a day (BID) | ORAL | 0 refills | Status: DC | PRN

## 2017-09-28 MED ORDER — NYSTATIN 100000 UNIT/ML MT SUSP: 5.0000 mL | Freq: Three times a day (TID) | OROMUCOSAL | 0 refills | Status: DC

## 2017-09-28 MED ORDER — PREDNISONE 5 MG PO TABS: 5.0000 mg | ORAL_TABLET | Freq: Every day | ORAL | 0 refills | Status: DC

## 2017-09-28 NOTE — Progress Notes (Signed)
History of Present Illness Nicole Melendez is a 41 y.o. female former smoker ( 20 pack years quit 2014) with sarcoidosis, DM and obesity. She is followed by Dr. Lamonte Sakai.   09/28/2017 Hospital Follow Up: Pt. Has been admitted twice in the last 2 weeks. First 09/18/2017-09/22/2017 for CAP. She had wheezing and dyspnea prior to admission. She was treated with antibiotics, scheduled neb treatments, steroids and mucinex. She was discharged 09/22/2017 on a prednisone taper and with Levaquin to finish. She states she was compliant with her Levaquin and prednisone. She was compliant with her Symbicort and Xopenex inhaler as directed at discharge. She developed respiratory distress 09/25/2017 and was readmitted for acute bronchitis. Respiratory panel done during her first admission was positive for RSV.Marland Kitchen She was treated with neb treatments, IV steroids, Levaquin and Mucinex. She is still taking the prednisone taper prescribed at discharge. She remains compliant with her Symbicort.She continues to have fatigue and cough. Secretions are clear.She denies fever, chest pain, orthopnea or hemoptysis.  Test Results: Personally reviewed by me. CXR 09/25/2017>>FINDINGS: The lungs are clear without focal pneumonia, edema, pneumothorax or pleural effusion. Diffuse interstitial opacity is seen bilaterally. No focal airspace consolidation or pulmonary edema. The cardiopericardial silhouette is within normal limits for size. The visualized bony structures of the thorax are intact.  IMPRESSION: Stable without new or acute interval findings.  09/19/2017>> Echo EF is 60-65% PAP pressure 27 mm Hg Mild tricuspid regurgitation  09/18/2017>> CXR IMPRESSION: Right basilar atelectasis versus early infiltrate. Follow-up to resolution recommended.  CBC Latest Ref Rng & Units 09/27/2017 09/26/2017 09/25/2017  WBC 4.0 - 10.5 K/uL 18.1(H) 22.5(H) 13.7(H)  Hemoglobin 12.0 - 15.0 g/dL 12.2 12.5 12.6  Hematocrit 36.0 - 46.0 %  36.3 36.9 37.5  Platelets 150 - 400 K/uL 280 279 266    BMP Latest Ref Rng & Units 09/27/2017 09/26/2017 09/25/2017  Glucose 65 - 99 mg/dL 228(H) 284(H) 345(H)  BUN 6 - 20 mg/dL '20 19 19  ' Creatinine 0.44 - 1.00 mg/dL 0.85 0.87 0.97  Sodium 135 - 145 mmol/L 135 135 134(L)  Potassium 3.5 - 5.1 mmol/L 4.4 3.9 3.3(L)  Chloride 101 - 111 mmol/L 102 102 101  CO2 22 - 32 mmol/L '26 23 24  ' Calcium 8.9 - 10.3 mg/dL 9.0 8.9 8.7(L)     PFT    Component Value Date/Time   FEV1PRE 2.28 09/23/2015 0843   FEV1POST 2.31 09/23/2015 0843   FVCPRE 2.71 09/23/2015 0843   FVCPOST 2.77 09/23/2015 0843   TLC 4.11 09/23/2015 0843   DLCOUNC 17.40 09/23/2015 0843   PREFEV1FVCRT 84 09/23/2015 0843   PSTFEV1FVCRT 84 09/23/2015 0843    Dg Chest 2 View  Result Date: 09/25/2017 CLINICAL DATA:  Shortness of breath and wheezing. EXAM: CHEST  2 VIEW COMPARISON:  09/18/2017 FINDINGS: The lungs are clear without focal pneumonia, edema, pneumothorax or pleural effusion. Diffuse interstitial opacity is seen bilaterally. No focal airspace consolidation or pulmonary edema. The cardiopericardial silhouette is within normal limits for size. The visualized bony structures of the thorax are intact. IMPRESSION: Stable without new or acute interval findings. Electronically Signed   By: Misty Stanley M.D.   On: 09/25/2017 09:10   Dg Chest 2 View  Result Date: 09/18/2017 CLINICAL DATA:  41 year old female with cough, congestion, shortness of breath. History of sarcoidosis. EXAM: CHEST  2 VIEW COMPARISON:  11/21/2015 FINDINGS: The heart size and mediastinal contours are within normal limits. There is prominence of the right hilum, similar to examination. Vague right basilar  opacities suggesting atelectasis versus early infiltrate. The left lung is clear. No sizeable effusions or pneumothorax. The visualized skeletal structures are unremarkable. IMPRESSION: Right basilar atelectasis versus early infiltrate. Follow-up to resolution  recommended. Electronically Signed   By: Kristopher Oppenheim M.D.   On: 09/18/2017 18:08     Past medical hx Past Medical History:  Diagnosis Date  . Allergic rhinitis, cause unspecified   . Anemia   . Anxiety   . Depression   . Diabetes mellitus type II    steroid related, patient states "im no longer diabetic"  . Essential hypertension 08/08/2015  . GERD (gastroesophageal reflux disease)   . History of blood transfusion   . Hyperlipidemia   . Menorrhagia   . Migraine   . Morbid obesity ([place])   . Otitis media 06/28/2015  . Peptic ulcer disease   . Positive ANA (antinuclear antibody) 02/14/2012  . Sarcoid    including hand per rheumatology-Dr. Amil Amen  . Sarcoidosis of lung ([place])   . Shortness of breath    on exertion  . Varicose veins with pain      Social History   Tobacco Use  . Smoking status: Former Smoker    Packs/day: 1.00    Years: 20.00    Pack years: 20.00    Types: Cigarettes    Last attempt to quit: 10/25/2012    Years since quitting: 4.9  . Smokeless tobacco: Never Used  . Tobacco comment: QUIT 04/2010 AND STARTED BACK 2014 X 3 MONTHS. less than 1 ppd.  started at age 55.    Substance Use Topics  . Alcohol use: No  . Drug use: No    Ms.Phillis reports that she quit smoking about 4 years ago. Her smoking use included cigarettes. She has a 20.00 pack-year smoking history. she has never used smokeless tobacco. She reports that she does not drink alcohol or use drugs.  Tobacco Cessation: Former smoker quit 2014 with a 20 pack year smoking history  Past surgical hx, Family hx, Social hx all reviewed.  Current Outpatient Medications on File Prior to Visit  Medication Sig  . ALPRAZolam (XANAX) 0.5 MG tablet Take 1 tablet (0.5 mg total) by mouth 3 (three) times daily as needed.  Marland Kitchen azelastine (ASTELIN) 0.1 % nasal spray Place 2 sprays into both nostrils 2 (two) times daily. Use in each nostril as directed  . Blood Glucose Monitoring Suppl (ONE TOUCH ULTRA 2)  W/DEVICE KIT Use as directed four times daily. Dx: E11.9  . budesonide-formoterol (SYMBICORT) 160-4.5 MCG/ACT inhaler Inhale 2 puffs into the lungs 2 (two) times daily.  . cetirizine (ZYRTEC) 10 MG tablet TAKE 1 TABLET(10 MG) BY MOUTH DAILY  . cyclobenzaprine (FLEXERIL) 5 MG tablet Take 2 tablets (10 mg total) by mouth at bedtime as needed for muscle spasms.  . Diclofenac Sodium (PENNSAID) 2 % SOLN Apply 1 pump twice daily as needed.  . fluticasone (FLONASE) 50 MCG/ACT nasal spray Place 2 sprays into both nostrils daily.  . folic acid (FOLVITE) 1 MG tablet TAKE 2 TABLETS BY MOUTH DAILY  . furosemide (LASIX) 40 MG tablet 1 tab by mouth every day in the AM and then 1 as needed only for persistent swelling in the PM  . gabapentin (NEURONTIN) 300 MG capsule TAKE 1 CAPSULE(300 MG) BY MOUTH THREE TIMES DAILY  . glucose blood (ONE TOUCH ULTRA TEST) test strip Use to check blood sugars four times a day Dx E11.9  . guaiFENesin (MUCINEX) 600 MG 12 hr tablet Take 1  tablet (600 mg total) by mouth 2 (two) times daily for 10 days.  Marland Kitchen HUMALOG 100 UNIT/ML injection INJECT UP TO 12 UNITS INTO THE SKIN THREE TIMES DAILY WITH MEALS WITH SLIDING SCALE AS DIRECTED  . Insulin Syringe-Needle U-100 25G X 1" 1 ML MISC Use to administer humalog insulin three times a day Dx E11.9  . levalbuterol (XOPENEX HFA) 45 MCG/ACT inhaler Inhale 1-2 puffs into the lungs every 8 (eight) hours as needed for wheezing or shortness of breath. USE 2 PUFFS 3 TIMES DAILY X 5 DAYS THEN BACK TO HOME REGIMEN AS NEEDED.  . methotrexate (RHEUMATREX) 2.5 MG tablet Take 15 mg by mouth every Tuesday.   Marland Kitchen omeprazole (PRILOSEC) 40 MG capsule Take 1 capsule (40 mg total) by mouth daily.  . ondansetron (ZOFRAN-ODT) 8 MG disintegrating tablet Take 1 tablet (8 mg total) by mouth every 8 (eight) hours as needed for nausea or vomiting.  . ONE TOUCH ULTRA TEST test strip TEST BLOOD SUGAR FOUR TIMES DAILY  . oxyCODONE-acetaminophen (PERCOCET) 10-325 MG tablet  Take 1 tablet by mouth daily as needed for pain.  . potassium chloride (K-DUR) 10 MEQ tablet 3 tab by mouth per day only when taking the lasix (Patient taking differently: Take 10-20 mEq by mouth 2 (two) times daily. Take 10 meq in the morning and 37mq at night)  . predniSONE (DELTASONE) 10 MG tablet Prednisone 40 mg po daily x 2 day then Prednisone 30 mg po daily x 2 day then Prednisone 20 mg po daily x 2 day then Prednisone 10 mg daily x 2 day then stop...  . ranitidine (ZANTAC) 150 MG tablet TAKE 1 TABLET(150 MG) BY MOUTH AT BEDTIME  . rosuvastatin (CRESTOR) 20 MG tablet Take 1 tablet (20 mg total) by mouth daily.  . sertraline (ZOLOFT) 100 MG tablet TAKE 2 TABLETS BY MOUTH DAILY  . SUMAtriptan (IMITREX) 100 MG tablet Take 1 tablet by mouth at the onset of a headache. May repeat in 2 hours if headache persists or recurs.  .Marland Kitchentelmisartan (MICARDIS) 40 MG tablet TAKE 1 TABLET(40 MG) BY MOUTH DAILY  . TURMERIC PO Take 1 tablet by mouth daily.  . vitamin B-12 (CYANOCOBALAMIN) 1000 MCG tablet Take 1,000 mcg by mouth daily.  . Vitamin D, Ergocalciferol, (DRISDOL) 50000 units CAPS capsule TAKE 1 CAPSULE BY MOUTH EVERY 7 DAYS  . zonisamide (ZONEGRAN) 100 MG capsule Take 1 capsule (100 mg total) by mouth daily.   No current facility-administered medications on file prior to visit.      Allergies  Allergen Reactions  . Azithromycin Hives    (z pak) hives  . Bee Venom Anaphylaxis  . Flagyl [Metronidazole] Hives and Shortness Of Breath  . Penicillins Shortness Of Breath and Swelling    Has patient had a PCN reaction causing immediate rash, facial/tongue/throat swelling, SOB or lightheadedness with hypotension: Yes Has patient had a PCN reaction causing severe rash involving mucus membranes or skin necrosis: No Has patient had a PCN reaction that required hospitalization No Has patient had a PCN reaction occurring within the last 10 years: No If all of the above answers are "NO", then may proceed  with Cephalosporin use.  REACTION: swelling and difficulty breathing  . Shellfish Allergy Anaphylaxis  . Klonopin [Clonazepam] Other (See Comments)    Memory difficulty    Review Of Systems:  Constitutional:   No  weight loss, night sweats,  Fevers, chills, +fatigue, or  lassitude.  HEENT:   No headaches,  Difficulty swallowing,  Tooth/dental problems, or  Sore throat,                No sneezing, itching, ear ache, +nasal congestion, +post nasal drip,   CV:  No chest pain,  Orthopnea, PND, +swelling in lower extremities, no anasarca, dizziness, palpitations, syncope.   GI  No heartburn, indigestion, abdominal pain, nausea, vomiting, diarrhea, change in bowel habits, loss of appetite, bloody stools.   Resp: + shortness of breath with exertion not  at rest.  No excess mucus, + productive cough,  No non-productive cough,  No coughing up of blood.  No change in color of mucus.  + wheezing.  No chest wall deformity  Skin: no rash or lesions.  GU: no dysuria, change in color of urine, no urgency or frequency.  No flank pain, no hematuria   MS:  No joint pain or swelling.  No decreased range of motion.  No back pain.  Psych:  No change in mood or affect. No depression or anxiety.  No memory loss.   Vital Signs BP 124/66 (BP Location: Left Arm, Cuff Size: Normal)   Pulse 97   Ht '5\' 7"'  (1.702 m)   Wt (!) 320 lb 9.6 oz (145.4 kg)   LMP 07/17/2012   SpO2 97%   BMI 50.21 kg/m    Physical Exam:  General- No distress,  A&Ox3, pleasant ENT: No sinus tenderness, TM clear, pale nasal mucosa, no oral exudate,no post nasal drip, no LAN Cardiac: S1, S2, regular rate and rhythm, no murmur Chest: No wheeze/ rales/ dullness; no accessory muscle use, no nasal flaring, no sternal retractions, speaks in full sentences Abd.: Soft Non-tender, non-distended, BS +, Obese Ext: No clubbing cyanosis, BLE 1+ edema Neuro:  normal strength Skin: No rashes, warm and dry Psych: normal mood and  behavior   Assessment/Plan  Sarcoidosis of lung (HCC) Stable despite CAP and Bronchitis flare Plan: Continue prednisone taper as you have been doing Return in 2 weeks to evaluate respiratory status with CXR prior to visit. Please contact office for sooner follow up if symptoms do not improve or worsen or seek emergency care   Wynona Canes after prednisone treatment for bronchitis/pneumonia Plan: Nystatin mouthwash 5 cc's three times daily x 10 days rinse and spit Please contact office for sooner follow up if symptoms do not improve or worsen or seek emergency care   Morbid obesity due to excess calories ([place] Continue to work on weight loss Diet and increase activity level as able.  CAP Resolving Completed Antibiotics  Continued prednisone taper Plan: Continue your prednisone taper as you have been doing. Start 5 mg daily for 2 weeks after you complete the hospital taper. We will give you a letter for out of work through 10/10/17. Continue your Symbicort and Rescue inhaler as you have been doing. Rinse mouth after use. Continue Mucinex as prescribed at discharge Delsym 5 cc's every 12 hours for cough as needed Sinus regimen as prescribed at discharge, Follow up appointment in 2 weeks with Stat CXR prior to visit Please contact office for sooner follow up if symptoms do not improve or worsen or seek emergency care   Obesity Continue to work on weight loss and increase exercise as able  Magdalen Spatz, NP 09/28/2017  4:00 PM

## 2017-09-28 NOTE — Patient Instructions (Addendum)
It is good to see you today. Continue your prednisone taper as you have been doing. Start 5 mg daily for 2 weeks after you complete the hospital taper. We will give you a letter for out of work through 10/10/17. Continue your Symbicort and Rescue inhaler as you have been doing. Rinse mouth after use. Nystatin mouthwash 5 cc's three times daily x 10 days ( Rinse and spit)  Continue Mucinex as prescribed at discharge Delsym 5 cc's every 12 hours for cough as needed Sinus regimen as prescribed at discharge, Follow up appointment in 2 weeks with Stat CXR prior to visit Please contact office for sooner follow up if symptoms do not improve or worsen or seek emergency care

## 2017-09-28 NOTE — Assessment & Plan Note (Signed)
Continue to work on weight loss Diet and increase activity level as able.

## 2017-09-28 NOTE — Assessment & Plan Note (Signed)
Thrush after prednisone treatment for bronchitis/pneumonia Plan: Nystatin mouthwash 5 cc's three times daily x 10 days rinse and spit Please contact office for sooner follow up if symptoms do not improve or worsen or seek emergency care

## 2017-09-28 NOTE — Assessment & Plan Note (Signed)
Stable despite CAP and Bronchitis flare Plan: Continue prednisone taper as you have been doing Return in 2 weeks to evaluate respiratory status with CXR prior to visit. Please contact office for sooner follow up if symptoms do not improve or worsen or seek emergency care

## 2017-10-02 ENCOUNTER — Other Ambulatory Visit: Payer: Self-pay | Admitting: Internal Medicine

## 2017-10-03 ENCOUNTER — Inpatient Hospital Stay: Payer: Self-pay | Admitting: Acute Care

## 2017-10-04 NOTE — Telephone Encounter (Signed)
Refill done.  

## 2017-10-06 ENCOUNTER — Ambulatory Visit: Payer: Self-pay | Admitting: Internal Medicine

## 2017-10-11 ENCOUNTER — Encounter: Payer: Self-pay | Admitting: Internal Medicine

## 2017-10-11 ENCOUNTER — Ambulatory Visit (INDEPENDENT_AMBULATORY_CARE_PROVIDER_SITE_OTHER): Payer: BLUE CROSS/BLUE SHIELD | Admitting: Internal Medicine

## 2017-10-11 VITALS — BP 142/88 | HR 97 | Temp 98.4°F | Ht 67.0 in | Wt 330.0 lb

## 2017-10-11 DIAGNOSIS — G8929 Other chronic pain: Secondary | ICD-10-CM | POA: Diagnosis not present

## 2017-10-11 DIAGNOSIS — I10 Essential (primary) hypertension: Secondary | ICD-10-CM

## 2017-10-11 DIAGNOSIS — E1140 Type 2 diabetes mellitus with diabetic neuropathy, unspecified: Secondary | ICD-10-CM

## 2017-10-11 DIAGNOSIS — D869 Sarcoidosis, unspecified: Secondary | ICD-10-CM

## 2017-10-11 DIAGNOSIS — Z794 Long term (current) use of insulin: Secondary | ICD-10-CM | POA: Diagnosis not present

## 2017-10-11 MED ORDER — INSULIN LISPRO 100 UNIT/ML ~~LOC~~ SOLN: SUBCUTANEOUS | 11 refills | Status: DC

## 2017-10-11 MED ORDER — OXYCODONE-ACETAMINOPHEN 10-325 MG PO TABS: ORAL_TABLET | ORAL | 0 refills | Status: DC

## 2017-10-11 NOTE — Patient Instructions (Addendum)
Please continue all other medications as before, and refills have been done if requeste - the percocet and Humalog  Please have the pharmacy call with any other refills you may need.  Please continue your efforts at being more active, low cholesterol diet, and weight control.  Please keep your appointments with your specialists as you may have planned  You are given the work note today  Please return in 3 months, or sooner if needed, with Lab testing done 3-5 days before

## 2017-10-11 NOTE — Progress Notes (Signed)
Subjective:    Patient ID: Nicole Melendez, female    DOB: 1976/03/22, 41 y.o.   MRN: [id number]  HPI  Here to f/u recent hospn x 2 , first with RSV, then with CAP RLL, sarcoid, thrush and d/c with steroid tx, sugars often over 500 in last 3 days.   Has gained wt recently . Has some polydipsia and polyuria. Wt Readings from Last 3 Encounters:  10/11/17 (!) 330 lb (149.7 kg)  09/28/17 (!) 320 lb 9.6 oz (145.4 kg)  09/27/17 (!) 318 lb 9 oz (144.5 kg)  For some reason pharmacy says too early to refill humalog which she has done well with self titration to account for steroid tx and uncontrolled cbgs; now actually out of med. Pt denies chest pain, increased sob or doe, wheezing, orthopnea, PND, increased LE swelling, palpitations, dizziness or syncope. Denies fever, ST, or prod cough.  Pt denies new neurological symptoms such as new headache, or facial or extremity weakness or numbness .  Also missed last pain clinic appt due to hospn., cannot follow up until jan 9, out of percocet.   Past Medical History:  Diagnosis Date  . Allergic rhinitis, cause unspecified   . Anemia   . Anxiety   . Depression   . Diabetes mellitus type II    steroid related, patient states "im no longer diabetic"  . Essential hypertension 08/08/2015  . GERD (gastroesophageal reflux disease)   . History of blood transfusion   . Hyperlipidemia   . Menorrhagia   . Migraine   . Morbid obesity ([place])   . Otitis media 06/28/2015  . Peptic ulcer disease   . Positive ANA (antinuclear antibody) 02/14/2012  . Sarcoid    including hand per rheumatology-Dr. Amil Amen  . Sarcoidosis of lung ([place])   . Shortness of breath    on exertion  . Varicose veins with pain    Past Surgical History:  Procedure Laterality Date  . ABDOMINAL HYSTERECTOMY    . COLONOSCOPY WITH PROPOFOL N/A 10/19/2016   Procedure: COLONOSCOPY WITH PROPOFOL;  Surgeon: Mauri Pole, MD;  Location: [facility];  Service: Endoscopy;  Laterality: N/A;  .  ESOPHAGOGASTRODUODENOSCOPY (EGD) WITH PROPOFOL N/A 10/19/2016   Procedure: ESOPHAGOGASTRODUODENOSCOPY (EGD) WITH PROPOFOL;  Surgeon: Mauri Pole, MD;  Location: [facility];  Service: Endoscopy;  Laterality: N/A;  . HERNIA MESH REMOVAL  02/2013  . uterine ablation  03/2010  . WISDOM TOOTH EXTRACTION      reports that she quit smoking about 4 years ago. Her smoking use included cigarettes. She has a 20.00 pack-year smoking history. she has never used smokeless tobacco. She reports that she does not drink alcohol or use drugs. family history includes Allergies in her mother; Bone cancer in her maternal aunt; Breast cancer in her maternal aunt; Diabetes in her father and mother; Heart attack in her mother; Heart disease in her father; Hypertension in her unknown relative; Lung cancer in her maternal aunt; Ovarian cancer in her maternal aunt; Rheum arthritis in her father; Stroke in her father and maternal uncle. Allergies  Allergen Reactions  . Azithromycin Hives    (z pak) hives  . Bee Venom Anaphylaxis  . Flagyl [Metronidazole] Hives and Shortness Of Breath  . Penicillins Shortness Of Breath and Swelling    Has patient had a PCN reaction causing immediate rash, facial/tongue/throat swelling, SOB or lightheadedness with hypotension: Yes Has patient had a PCN reaction causing severe rash involving mucus membranes or skin necrosis: No Has patient  had a PCN reaction that required hospitalization No Has patient had a PCN reaction occurring within the last 10 years: No If all of the above answers are "NO", then may proceed with Cephalosporin use.  REACTION: swelling and difficulty breathing  . Shellfish Allergy Anaphylaxis  . Klonopin [Clonazepam] Other (See Comments)    Memory difficulty   Current Outpatient Medications on File Prior to Visit  Medication Sig Dispense Refill  . ALPRAZolam (XANAX) 0.5 MG tablet Take 1 tablet (0.5 mg total) by mouth 3 (three) times daily as needed. 90  tablet 2  . azelastine (ASTELIN) 0.1 % nasal spray Place 2 sprays into both nostrils 2 (two) times daily. Use in each nostril as directed 30 mL 12  . Blood Glucose Monitoring Suppl (ONE TOUCH ULTRA 2) W/DEVICE KIT Use as directed four times daily. Dx: E11.9 1 each 0  . budesonide-formoterol (SYMBICORT) 160-4.5 MCG/ACT inhaler Inhale 2 puffs into the lungs 2 (two) times daily. 1 Inhaler 5  . cetirizine (ZYRTEC) 10 MG tablet TAKE 1 TABLET(10 MG) BY MOUTH DAILY 90 tablet 3  . cyclobenzaprine (FLEXERIL) 5 MG tablet Take 2 tablets (10 mg total) by mouth at bedtime as needed for muscle spasms.    Marland Kitchen dextromethorphan (DELSYM) 30 MG/5ML liquid Take 5 mLs (30 mg total) by mouth every 12 (twelve) hours as needed for cough. 100 mL 0  . Diclofenac Sodium (PENNSAID) 2 % SOLN Apply 1 pump twice daily as needed. 112 g 3  . fluticasone (FLONASE) 50 MCG/ACT nasal spray Place 2 sprays into both nostrils daily. 16 g 0  . folic acid (FOLVITE) 1 MG tablet TAKE 2 TABLETS BY MOUTH DAILY 180 tablet 0  . furosemide (LASIX) 40 MG tablet 1 tab by mouth every day in the AM and then 1 as needed only for persistent swelling in the PM 60 tablet 11  . gabapentin (NEURONTIN) 300 MG capsule TAKE 1 CAPSULE(300 MG) BY MOUTH THREE TIMES DAILY 90 capsule 5  . glucose blood (ONE TOUCH ULTRA TEST) test strip Use to check blood sugars four times a day Dx E11.9 400 each 11  . Insulin Syringe-Needle U-100 25G X 1" 1 ML MISC Use to administer humalog insulin three times a day Dx E11.9 100 each 5  . levalbuterol (XOPENEX HFA) 45 MCG/ACT inhaler Inhale 1-2 puffs into the lungs every 8 (eight) hours as needed for wheezing or shortness of breath. USE 2 PUFFS 3 TIMES DAILY X 5 DAYS THEN BACK TO HOME REGIMEN AS NEEDED. 1 Inhaler 0  . methotrexate (RHEUMATREX) 2.5 MG tablet Take 15 mg by mouth every Tuesday.     . nystatin (MYCOSTATIN) 100000 UNIT/ML suspension Take 5 mLs (500,000 Units total) by mouth 3 (three) times daily. 150 mL 0  . omeprazole  (PRILOSEC) 40 MG capsule Take 1 capsule (40 mg total) by mouth daily. 90 capsule 3  . ondansetron (ZOFRAN-ODT) 8 MG disintegrating tablet Take 1 tablet (8 mg total) by mouth every 8 (eight) hours as needed for nausea or vomiting. 20 tablet 0  . ONE TOUCH ULTRA TEST test strip TEST BLOOD SUGAR FOUR TIMES DAILY 400 each 0  . potassium chloride (K-DUR) 10 MEQ tablet 3 tab by mouth per day only when taking the lasix (Patient taking differently: Take 10-20 mEq by mouth 2 (two) times daily. Take 10 meq in the morning and 13mq at night) 90 tablet 11  . predniSONE (DELTASONE) 10 MG tablet Prednisone 40 mg po daily x 2 day then Prednisone 30 mg  po daily x 2 day then Prednisone 20 mg po daily x 2 day then Prednisone 10 mg daily x 2 day then stop... 20 tablet 0  . predniSONE (DELTASONE) 5 MG tablet Take 1 tablet (5 mg total) by mouth daily with breakfast. 14 tablet 0  . ranitidine (ZANTAC) 150 MG tablet TAKE 1 TABLET(150 MG) BY MOUTH AT BEDTIME 30 tablet 0  . rosuvastatin (CRESTOR) 20 MG tablet Take 1 tablet (20 mg total) by mouth daily. 90 tablet 3  . sertraline (ZOLOFT) 100 MG tablet TAKE 2 TABLETS BY MOUTH DAILY 180 tablet 3  . SUMAtriptan (IMITREX) 100 MG tablet Take 1 tablet by mouth at the onset of a headache. May repeat in 2 hours if headache persists or recurs. 10 tablet 2  . telmisartan (MICARDIS) 40 MG tablet TAKE 1 TABLET(40 MG) BY MOUTH DAILY 90 tablet 0  . TURMERIC PO Take 1 tablet by mouth daily.    . vitamin B-12 (CYANOCOBALAMIN) 1000 MCG tablet Take 1,000 mcg by mouth daily.    . Vitamin D, Ergocalciferol, (DRISDOL) 50000 units CAPS capsule TAKE 1 CAPSULE BY MOUTH EVERY 7 DAYS 12 capsule 0  . zonisamide (ZONEGRAN) 100 MG capsule Take 1 capsule (100 mg total) by mouth daily. 90 capsule 0   No current facility-administered medications on file prior to visit.    Review of Systems  Constitutional: Negative for other unusual diaphoresis or sweats HENT: Negative for ear discharge or  swelling Eyes: Negative for other worsening visual disturbances Respiratory: Negative for stridor or other swelling  Gastrointestinal: Negative for worsening distension or other blood Genitourinary: Negative for retention or other urinary change Musculoskeletal: Negative for other MSK pain or swelling Skin: Negative for color change or other new lesions Neurological: Negative for worsening tremors and other numbness  Psychiatric/Behavioral: Negative for worsening agitation or other fatigue All other system neg per pt    Objective:   Physical Exam BP (!) 142/88   Pulse 97   Temp 98.4 F (36.9 C) (Oral)   Ht _0  (1.702 m)   Wt (!) 330 lb (149.7 kg)   LMP 07/17/2012   SpO2 98%   BMI 51.69 kg/m  VS noted,  Constitutional: Pt appears in NAD HENT: Head: NCAT.  Right Ear: External ear normal.  Left Ear: External ear normal.  Eyes: . Pupils are equal, round, and reactive to light. Conjunctivae and EOM are normal Nose: without d/c or deformity Neck: Neck supple. Gross normal ROM Cardiovascular: Normal rate and regular rhythm.   Pulmonary/Chest: Effort normal and breath sounds without rales or wheezing.  Neurological: Pt is alert. At baseline orientation, motor grossly intact Skin: Skin is warm. No rashes, other new lesions, no LE edema Psychiatric: Pt behavior is normal without agitation  No other exam findings  Lab Results  Component Value Date   WBC 18.1 (H) 09/27/2017   HGB 12.2 09/27/2017   HCT 36.3 09/27/2017   PLT 280 09/27/2017   GLUCOSE 228 (H) 09/27/2017   CHOL 201 (H) 06/15/2017   TRIG 94.0 06/15/2017   HDL 41.40 06/15/2017   LDLDIRECT 135.0 07/05/2011   LDLCALC 141 (H) 06/15/2017   ALT 10 06/15/2017   AST 11 06/15/2017   NA 135 09/27/2017   K 4.4 09/27/2017   CL 102 09/27/2017   CREATININE 0.85 09/27/2017   BUN 20 09/27/2017   CO2 26 09/27/2017   TSH 1.59 06/15/2017   INR 1.3 11/17/2012   HGBA1C 6.1 06/15/2017   MICROALBUR 0.7 06/15/2017  Assessment & Plan:

## 2017-10-12 ENCOUNTER — Encounter: Payer: Self-pay | Admitting: Internal Medicine

## 2017-10-12 NOTE — Assessment & Plan Note (Signed)
stable overall by history and exam, recent data reviewed with pt, and pt to continue medical treatment as before,  to f/u any worsening symptoms or concerns BP Readings from Last 3 Encounters:  10/11/17 (!) 142/88  09/28/17 124/66  09/27/17 (!) 130/54

## 2017-10-12 NOTE — Assessment & Plan Note (Signed)
[place] for refill humalog to account for increased SSI,  to f/u any worsening symptoms or concerns

## 2017-10-12 NOTE — Assessment & Plan Note (Signed)
[facility] for limited 1 mo percocet,  to f/u any worsening symptoms or concerns

## 2017-10-12 NOTE — Discharge Summary (Signed)
Physician Discharge Summary    Nicole Melendez NFA:[id number] DOB: September 19, 1976 DOA: 09/25/2017     PCP: Biagio Borg, MD     Admit date: 09/25/2017  Discharge date: 09/27/2017     Time spent: 35* minutes     Recommendations for Outpatient Follow-up:   1.Follow up PCP in 2 weeks         Discharge Diagnoses:   Principal Problem:    URI (upper respiratory infection)  Active Problems:    PULMONARY SARCOIDOSIS    Anxiety state    Acute upper respiratory infection    Essential hypertension    DOE (dyspnea on exertion)    Polyarthralgia    Rheumatoid arthritis (HCC)    Chronic diastolic CHF (congestive heart failure) ([place])        Discharge Condition: Stable     Diet recommendation: Heart healthy diet         Filed Weights        09/27/17 0502    Weight:   (!) 144.5 kg (318 lb 9 oz)          History of present illness:   41 y.o. female with medical history significant of sarcoidosis, dm, obesity just d/c a couple of days ago for a URI sent home on levaquin and prednisone taper with nebs comes in with still not feeling well.  She reports she is taking her steroids and levaquin.  Doing her nebs at home.  Still sob and coughing a lot along with wheezing.  No fevers.  No n/v/d.  No swelling in her legs.  Pt referred for admission for sarcoidosis flare.              Hospital Course:   1.Acute bronchitis-respiratory panel showed rhinovirus last week.  Started on Solu-Medrol 60 mg IV every 12 hours.  Levaquin 750 mg daily, Mucinex 1 tablet p.o. twice daily. She has improved, will discharge on Prednisone taper, Mucinex.   2.Sarcoidosis- stable   3.Hypertension-continue home medications   4.Current diastolic CHF-euvolemic at this time.  Echocardiogram shows no diastolic dysfunction.   5.Depression-stable, continue Zoloft.               Procedures:  .Echo      Consultations:  .None        Discharge Exam:       Vitals:        09/27/17 0502   09/27/17 0812    BP:   (!) 130/54        Pulse:   68        Resp:   19        Temp:   97.9 F (36.6 C)        SpO2:   99%   97%          General: Appears in no acute distress  Cardiovascular: S1S2, RRR  Respiratory: Clear bilaterally     Discharge Instructions             Discharge Instructions          Diet - low sodium heart healthy     Complete by:  As directed           Increase activity slowly     Complete by:  As directed                     Allergies as of 09/27/2017  Reactions         Azithromycin   Hives        (z pak) hives        Bee Venom   Anaphylaxis        Flagyl [metronidazole]   Hives, Shortness Of Breath        Penicillins   Shortness Of Breath, Swelling        Has patient had a PCN reaction causing immediate rash, facial/tongue/throat swelling, SOB or lightheadedness with hypotension: Yes  Has patient had a PCN reaction causing severe rash involving mucus membranes or skin necrosis: No  Has patient had a PCN reaction that required hospitalization No  Has patient had a PCN reaction occurring within the last 10 years: No  If all of the above answers are "NO", then may proceed with Cephalosporin use.  REACTION: swelling and difficulty breathing        Shellfish Allergy   Anaphylaxis        Klonopin [clonazepam]   Other (See Comments)        Memory difficulty                        Medication List          STOP taking these medications      acetaminophen 500 MG tablet  Commonly known as:  TYLENOL       levofloxacin 750 MG tablet  Commonly known as:  LEVAQUIN       naproxen 500 MG tablet  Commonly known as:  NAPROSYN       VITAMIN D3 PO              TAKE these medications      alendronate 70 MG  tablet  Commonly known as:  FOSAMAX  Take 1 tablet (70 mg total) by mouth every 7 (seven) days.       ALPRAZolam 0.5 MG tablet  Commonly known as:  XANAX  Take 1 tablet (0.5 mg total) by mouth 3 (three) times daily as needed.       azelastine 0.1 % nasal spray  Commonly known as:  ASTELIN  Place 2 sprays into both nostrils 2 (two) times daily. Use in each nostril as directed       benzonatate 200 MG capsule  Commonly known as:  TESSALON  Take 1 capsule (200 mg total) by mouth 3 (three) times daily as needed for cough.       budesonide-formoterol 160-4.5 MCG/ACT inhaler  Commonly known as:  SYMBICORT  Inhale 2 puffs into the lungs 2 (two) times daily.       cetirizine 10 MG tablet  Commonly known as:  ZYRTEC  TAKE 1 TABLET(10 MG) BY MOUTH DAILY       cyclobenzaprine 5 MG tablet  Commonly known as:  FLEXERIL  Take 2 tablets (10 mg total) by mouth at bedtime as needed for muscle spasms.       Diclofenac Sodium 2 % Soln  Commonly known as:  PENNSAID  Apply 1 pump twice daily as needed.       fluticasone 50 MCG/ACT nasal spray  Commonly known as:  FLONASE  Place 2 sprays into both nostrils daily.       folic acid 1 MG tablet  Commonly known as:  FOLVITE  TAKE 2 TABLETS BY MOUTH DAILY       furosemide 40 MG tablet  Commonly known as:  LASIX  1 tab by  mouth every day in the AM and then 1 as needed only for persistent swelling in the PM       gabapentin 300 MG capsule  Commonly known as:  NEURONTIN  TAKE 1 CAPSULE(300 MG) BY MOUTH THREE TIMES DAILY       glucose blood test strip  Commonly known as:  ONE TOUCH ULTRA TEST  Use to check blood sugars four times a day Dx E11.9       ONE TOUCH ULTRA TEST test strip  Generic drug:  glucose blood  TEST BLOOD SUGAR FOUR TIMES DAILY       guaiFENesin 600 MG 12 hr tablet  Commonly known as:  MUCINEX  Take 1 tablet (600 mg total) by mouth 2 (two) times daily  for 10 days.  What changed:  how much to take       HUMALOG 100 UNIT/ML injection  Generic drug:  insulin lispro  INJECT UP TO 12 UNITS INTO THE SKIN THREE TIMES DAILY WITH MEALS WITH SLIDING SCALE AS DIRECTED       Insulin Syringe-Needle U-100 25G X 1" 1 ML Misc  Use to administer humalog insulin three times a day Dx E11.9       levalbuterol 45 MCG/ACT inhaler  Commonly known as:  XOPENEX HFA  Inhale 1-2 puffs into the lungs every 8 (eight) hours as needed for wheezing or shortness of breath. USE 2 PUFFS 3 TIMES DAILY X 5 DAYS THEN BACK TO HOME REGIMEN AS NEEDED.  What changed:  Another medication with the same name was removed. Continue taking this medication, and follow the directions you see here.       lidocaine 2 % solution  Commonly known as:  XYLOCAINE  Use as directed 20 mLs in the mouth or throat as needed for mouth pain.       methotrexate 2.5 MG tablet  Commonly known as:  RHEUMATREX  Take 15 mg by mouth every Tuesday.  What changed:  Another medication with the same name was removed. Continue taking this medication, and follow the directions you see here.       nystatin 100000 UNIT/ML suspension  Commonly known as:  MYCOSTATIN  Take 5 mLs (500,000 Units total) by mouth 4 (four) times daily for 5 days.       omeprazole 40 MG capsule  Commonly known as:  PRILOSEC  Take 1 capsule (40 mg total) by mouth daily.       ondansetron 8 MG disintegrating tablet  Commonly known as:  ZOFRAN-ODT  Take 1 tablet (8 mg total) by mouth every 8 (eight) hours as needed for nausea or vomiting.       ONE TOUCH ULTRA 2 w/Device Kit  Use as directed four times daily. Dx: E11.9       oxyCODONE-acetaminophen 10-325 MG tablet  Commonly known as:  PERCOCET  Take 1 tablet by mouth daily as needed for pain.       potassium chloride 10 MEQ tablet  Commonly known as:  K-DUR  3 tab by mouth per day only when taking the lasix  What  changed:    .how much to take   .how to take this   .when to take this   .additional instructions        predniSONE 10 MG tablet  Commonly known as:  DELTASONE  Prednisone 40 mg po daily x 2 day then  Prednisone 30 mg po daily x 2 day then  Prednisone 20 mg po daily  x 2 day then  Prednisone 10 mg daily x 2 day then stop...  What changed:    .medication strength   .how much to take   .how to take this   .when to take this   .additional instructions        ranitidine 150 MG tablet  Commonly known as:  ZANTAC  TAKE 1 TABLET(150 MG) BY MOUTH AT BEDTIME       rosuvastatin 20 MG tablet  Commonly known as:  CRESTOR  Take 1 tablet (20 mg total) by mouth daily.       sertraline 100 MG tablet  Commonly known as:  ZOLOFT  TAKE 2 TABLETS BY MOUTH DAILY       SUMAtriptan 100 MG tablet  Commonly known as:  IMITREX  Take 1 tablet by mouth at the onset of a headache. May repeat in 2 hours if headache persists or recurs.       telmisartan 40 MG tablet  Commonly known as:  MICARDIS  TAKE 1 TABLET(40 MG) BY MOUTH DAILY       TURMERIC PO  Take 1 tablet by mouth daily.       vitamin B-12 1000 MCG tablet  Commonly known as:  CYANOCOBALAMIN  Take 1,000 mcg by mouth daily.       Vitamin D (Ergocalciferol) 50000 units Caps capsule  Commonly known as:  DRISDOL  TAKE 1 CAPSULE BY MOUTH EVERY 7 DAYS       zonisamide 100 MG capsule  Commonly known as:  ZONEGRAN  Take 1 capsule (100 mg total) by mouth daily.                      Allergies    Allergen   Reactions    .   Azithromycin   Hives            (z pak) hives    .   Bee Venom   Anaphylaxis    .   Flagyl [Metronidazole]   Hives and Shortness Of Breath    .   Penicillins   Shortness Of Breath and Swelling            Has patient had a PCN reaction causing immediate rash, facial/tongue/throat swelling, SOB or  lightheadedness with hypotension: Yes  Has patient had a PCN reaction causing severe rash involving mucus membranes or skin necrosis: No  Has patient had a PCN reaction that required hospitalization No  Has patient had a PCN reaction occurring within the last 10 years: No  If all of the above answers are "NO", then may proceed with Cephalosporin use.     REACTION: swelling and difficulty breathing    .   Shellfish Allergy   Anaphylaxis    .   Klonopin [Clonazepam]   Other (See Comments)            Memory difficulty                   The results of significant diagnostics from this hospitalization (including imaging, microbiology, ancillary and laboratory) are listed below for reference.        Significant Diagnostic Studies:    Imaging Results                              Microbiology:          Recent Results (from the past 240 hour(s))  Culture, blood (routine x 2)     Status: None        Collection Time: 09/18/17  8:18 PM    Result   Value   Ref Range   Status        Specimen Description   BLOOD RIGHT HAND       Final        Special Requests           Final            BOTTLES DRAWN AEROBIC AND ANAEROBIC Blood Culture adequate volume        Culture           Final            NO GROWTH 5 DAYS  Performed at [facility], [address], [place], [place] [address]           Report Status   09/24/2017 FINAL       Final    Culture, blood (routine x 2)     Status: None        Collection Time: 09/18/17  8:36 PM    Result   Value   Ref Range   Status        Specimen Description   BLOOD LEFT HAND       Final        Special Requests           Final            BOTTLES DRAWN AEROBIC AND ANAEROBIC Blood Culture adequate volume        Culture           Final            NO GROWTH 5  DAYS  Performed at [facility], [place] [address] [place],  [address]           Report Status   09/24/2017 FINAL       Final    Respiratory Panel by PCR     Status: Abnormal        Collection Time: 09/18/17 11:34 PM    Result   Value   Ref Range   Status        Adenovirus   NOT DETECTED   NOT DETECTED   Final        Coronavirus 229E   NOT DETECTED   NOT DETECTED   Final        Coronavirus HKU1   NOT DETECTED   NOT DETECTED   Final        Coronavirus NL63   NOT DETECTED   NOT DETECTED   Final        Coronavirus OC43   NOT DETECTED   NOT DETECTED   Final        Metapneumovirus   NOT DETECTED   NOT DETECTED   Final        Rhinovirus / Enterovirus   DETECTED (A)   NOT DETECTED   Final        Influenza A   NOT DETECTED   NOT DETECTED   Final        Influenza B   NOT DETECTED   NOT DETECTED   Final        Parainfluenza Virus 1   NOT DETECTED   NOT DETECTED   Final        Parainfluenza Virus 2   NOT DETECTED   NOT  DETECTED   Final        Parainfluenza Virus 3   NOT DETECTED   NOT DETECTED   Final        Parainfluenza Virus 4   NOT DETECTED   NOT DETECTED   Final        Respiratory Syncytial Virus   NOT DETECTED   NOT DETECTED   Final        Bordetella pertussis   NOT DETECTED   NOT DETECTED   Final        Chlamydophila pneumoniae   NOT DETECTED   NOT DETECTED   Final        Mycoplasma pneumoniae   NOT DETECTED   NOT DETECTED   Final            Comment:   Performed at [facility], [place] [address] [place], [place] [address]    Culture, sputum-assessment     Status: None        Collection Time: 09/19/17  2:30 AM    Result   Value   Ref Range   Status        Specimen Description   EXPECTORATED SPUTUM       Final        Special Requests    Immunocompromised       Final        Sputum evaluation   THIS SPECIMEN IS ACCEPTABLE FOR SPUTUM CULTURE       Final        Report Status   09/19/2017 FINAL       Final    Culture, respiratory (NON-Expectorated)     Status: None        Collection Time: 09/19/17  2:30 AM    Result   Value   Ref Range   Status        Specimen Description   EXPECTORATED SPUTUM       Final        Special Requests   Immunocompromised Reflexed from [id number]       Final        Gram Stain           Final            FEW WBC PRESENT, PREDOMINANTLY PMN  RARE SQUAMOUS EPITHELIAL CELLS PRESENT  RARE GRAM POSITIVE COCCI IN PAIRS  RARE GRAM POSITIVE RODS           Culture           Final            Consistent with normal respiratory flora.  Performed at [facility], [place] [address] [place], [place] [address]           Report Status   09/21/2017 FINAL       Final          Labs:  Basic Metabolic Panel:  Last Labs  Liver Function Tests:   Last Labs         Last Labs         Last Labs        CBC:  Last Labs                                                                                                                                                CBG:  Last Labs                                                                                  Signed:     Oswald Hillock MD.   [facility]  09/27/2017, 10:23 AM

## 2017-10-12 NOTE — Assessment & Plan Note (Signed)
To finish prednisone as planned, o/w stable overall by history and exam, and pt to continue medical treatment as before,  to f/u any worsening symptoms or concerns

## 2017-10-19 DIAGNOSIS — M47817 Spondylosis without myelopathy or radiculopathy, lumbosacral region: Secondary | ICD-10-CM | POA: Diagnosis not present

## 2017-10-21 ENCOUNTER — Telehealth: Payer: Self-pay | Admitting: Acute Care

## 2017-10-21 NOTE — Telephone Encounter (Signed)
  Packet given to Jonelle Sidle to ensure SG completes and returns to [employer]

## 2017-10-21 NOTE — Telephone Encounter (Signed)
I don't have the papers.  Should I be doing this or Dr. Lamonte Sakai? Thanks

## 2017-10-21 NOTE — Telephone Encounter (Signed)
Pt was seen last 2 times by you-therefore they send forms to you. They are in your look at stack. Thanks

## 2017-10-24 ENCOUNTER — Ambulatory Visit: Payer: Self-pay | Admitting: Acute Care

## 2017-10-26 ENCOUNTER — Telehealth: Payer: Self-pay | Admitting: Internal Medicine

## 2017-10-26 MED ORDER — ALPRAZOLAM 0.5 MG PO TABS: 0.5000 mg | ORAL_TABLET | Freq: Three times a day (TID) | ORAL | 2 refills | Status: DC | PRN

## 2017-10-26 NOTE — Telephone Encounter (Signed)
Xanax Done erx

## 2017-11-07 ENCOUNTER — Telehealth: Payer: Self-pay | Admitting: Acute Care

## 2017-11-07 NOTE — Telephone Encounter (Signed)
I have the forms and gave SG to fill out. I called Glenard Haring to let her know the status. no answer. Left message for Glenard Haring to call back.]

## 2017-11-07 NOTE — Telephone Encounter (Signed)
I sent forms back to [employer], SG filled them out and returned them to me. Nothing further is needed.

## 2017-11-08 ENCOUNTER — Other Ambulatory Visit (INDEPENDENT_AMBULATORY_CARE_PROVIDER_SITE_OTHER): Payer: Self-pay

## 2017-11-08 ENCOUNTER — Other Ambulatory Visit: Payer: Self-pay | Admitting: Internal Medicine

## 2017-11-08 ENCOUNTER — Ambulatory Visit: Payer: BLUE CROSS/BLUE SHIELD | Admitting: Internal Medicine

## 2017-11-08 ENCOUNTER — Encounter: Payer: Self-pay | Admitting: Internal Medicine

## 2017-11-08 ENCOUNTER — Telehealth: Payer: Self-pay

## 2017-11-08 VITALS — BP 126/84 | HR 89 | Temp 98.1°F | Ht 67.0 in | Wt 327.0 lb

## 2017-11-08 DIAGNOSIS — F32A Depression, unspecified: Secondary | ICD-10-CM

## 2017-11-08 DIAGNOSIS — F411 Generalized anxiety disorder: Secondary | ICD-10-CM | POA: Diagnosis not present

## 2017-11-08 DIAGNOSIS — E1140 Type 2 diabetes mellitus with diabetic neuropathy, unspecified: Secondary | ICD-10-CM | POA: Diagnosis not present

## 2017-11-08 DIAGNOSIS — F329 Major depressive disorder, single episode, unspecified: Secondary | ICD-10-CM

## 2017-11-08 DIAGNOSIS — E785 Hyperlipidemia, unspecified: Secondary | ICD-10-CM | POA: Diagnosis not present

## 2017-11-08 DIAGNOSIS — Z794 Long term (current) use of insulin: Secondary | ICD-10-CM

## 2017-11-08 DIAGNOSIS — D509 Iron deficiency anemia, unspecified: Secondary | ICD-10-CM | POA: Diagnosis not present

## 2017-11-08 DIAGNOSIS — I10 Essential (primary) hypertension: Secondary | ICD-10-CM | POA: Diagnosis not present

## 2017-11-08 LAB — CBC WITH DIFFERENTIAL/PLATELET
BASOS PCT: 1.2 % (ref 0.0–3.0)
Basophils Absolute: 0.1 10*3/uL (ref 0.0–0.1)
Eosinophils Absolute: 0.2 10*3/uL (ref 0.0–0.7)
Eosinophils Relative: 3.6 % (ref 0.0–5.0)
HEMATOCRIT: 38.8 % (ref 36.0–46.0)
Hemoglobin: 12.7 g/dL (ref 12.0–15.0)
LYMPHS ABS: 1.4 10*3/uL (ref 0.7–4.0)
LYMPHS PCT: 19.9 % (ref 12.0–46.0)
MCHC: 32.8 g/dL (ref 30.0–36.0)
MCV: 86.3 fl (ref 78.0–100.0)
MONOS PCT: 4.7 % (ref 3.0–12.0)
Monocytes Absolute: 0.3 10*3/uL (ref 0.1–1.0)
NEUTROS ABS: 4.8 10*3/uL (ref 1.4–7.7)
NEUTROS PCT: 70.6 % (ref 43.0–77.0)
PLATELETS: 303 10*3/uL (ref 150.0–400.0)
RBC: 4.5 Mil/uL (ref 3.87–5.11)
RDW: 14.4 % (ref 11.5–15.5)
WBC: 6.8 10*3/uL (ref 4.0–10.5)

## 2017-11-08 LAB — LIPID PANEL
CHOLESTEROL: 177 mg/dL (ref 0–200)
HDL: 45.4 mg/dL (ref >39.00)
LDL CALC: 116 mg/dL — AB (ref 0–99)
NONHDL: 131.24
Total CHOL/HDL Ratio: 4
Triglycerides: 78 mg/dL (ref 0.0–149.0)
VLDL: 15.6 mg/dL (ref 0.0–40.0)

## 2017-11-08 LAB — BASIC METABOLIC PANEL
BUN: 6 mg/dL (ref 6–23)
CHLORIDE: 105 meq/L (ref 96–112)
CO2: 28 mEq/L (ref 19–32)
CREATININE: 0.73 mg/dL (ref 0.40–1.20)
Calcium: 9.2 mg/dL (ref 8.4–10.5)
GFR: 111.46 mL/min (ref >60.00)
GLUCOSE: 139 mg/dL — AB (ref 70–99)
POTASSIUM: 3.7 meq/L (ref 3.5–5.1)
Sodium: 140 mEq/L (ref 135–145)

## 2017-11-08 LAB — HEPATIC FUNCTION PANEL
ALBUMIN: 3.8 g/dL (ref 3.5–5.2)
ALT: 10 U/L (ref 0–35)
AST: 12 U/L (ref 0–37)
Alkaline Phosphatase: 111 U/L (ref 39–117)
Bilirubin, Direct: 0 mg/dL (ref 0.0–0.3)
TOTAL PROTEIN: 7.1 g/dL (ref 6.0–8.3)
Total Bilirubin: 0.4 mg/dL (ref 0.2–1.2)

## 2017-11-08 LAB — IBC PANEL
Iron: 40 ug/dL — ABNORMAL LOW (ref 42–145)
Saturation Ratios: 10.9 % — ABNORMAL LOW (ref 20.0–50.0)
TRANSFERRIN: 262 mg/dL (ref 212.0–360.0)

## 2017-11-08 LAB — HEMOGLOBIN A1C: HEMOGLOBIN A1C: 7.1 % — AB (ref 4.6–6.5)

## 2017-11-08 MED ORDER — ROSUVASTATIN CALCIUM 40 MG PO TABS: 40.0000 mg | ORAL_TABLET | Freq: Every day | ORAL | 3 refills | Status: DC

## 2017-11-08 MED ORDER — BUPROPION HCL ER (XL) 150 MG PO TB24: 150.0000 mg | ORAL_TABLET | Freq: Every day | ORAL | 3 refills | Status: DC

## 2017-11-08 MED ORDER — HYDROXYZINE HCL 10 MG PO TABS: 10.0000 mg | ORAL_TABLET | Freq: Three times a day (TID) | ORAL | 2 refills | Status: DC | PRN

## 2017-11-08 NOTE — Patient Instructions (Addendum)
Please take all new medication as prescribed - the wellbutrin xl 150 per day  Please call in 4 weeks if you need to have this increased to 300 mg per day  Please take all new medication as prescribed - the hydroxyzine which can help like the xanax for anxiety and is not addictive  Please continue all other medications as before, and refills have been done if requested.  Please have the pharmacy call with any other refills you may need.  Please continue your efforts at being more active, low cholesterol diabetic diet, and weight control.  Please keep your appointments with your specialists as you may have planned  Please go to the LAB in the Basement (turn left off the elevator) for the tests to be done today  You will be contacted by phone if any changes need to be made immediately.  Otherwise, you will receive a letter about your results with an explanation, but please check with MyChart first.  Please remember to sign up for MyChart if you have not done so, as this will be important to you in the future with finding out test results, communicating by private email, and scheduling acute appointments online when needed.  Please return in 6 months, or sooner if needed, with Lab testing done 3-5 days before

## 2017-11-08 NOTE — Assessment & Plan Note (Signed)
stable overall by history and exam, recent data reviewed with pt, and pt to continue medical treatment as before,  to f/u any worsening symptoms or concerns Lab Results  Component Value Date   HGBA1C 7.1 (H) 11/08/2017   

## 2017-11-08 NOTE — Assessment & Plan Note (Signed)
BP Readings from Last 3 Encounters:  11/08/17 126/84  10/11/17 (!) 142/88  09/28/17 124/66  stable overall by history and exam, recent data reviewed with pt, and pt to continue medical treatment as before,  to f/u any worsening symptoms or concerns

## 2017-11-08 NOTE — Telephone Encounter (Signed)
-----   Message from Biagio Borg, MD sent at 11/08/2017 12:30 PM EST ----- Left message on MyChart, pt to cont same tx  Except  The test results show that your current treatment is OK, except the iron is mild low, the LDL cholesterol is too high, and the A1c is only slightly high.  Please continue your iron OTC, and increase the Crestor to 40 mg per day.  I will send a new prescription, and you should hear from the office as well.    Nicole Melendez to please inform pt, I will do rx

## 2017-11-08 NOTE — Progress Notes (Signed)
Subjective:    Patient ID: Nicole Melendez, female    DOB: Dec 27, 1975, 42 y.o.   MRN: [id number]  HPI  Here to f/u; overall doing ok,  Pt denies chest pain, increasing sob or doe, wheezing, orthopnea, PND, increased LE swelling, palpitations, dizziness or syncope.  Pt denies new neurological symptoms such as new headache, or facial or extremity weakness or numbness.  Pt denies polydipsia, polyuria, or low sugar episode.  Pt states overall good compliance with meds, mostly trying to follow appropriate diet, with wt overall stable,  but little exercise however. Tolerating new statin well.  C/o uncontrolled anxiety, trying not to take the xanax too often to try to minimize exposure, but has had several anxiety attacks with driving recently.  Has been on  paxil previously but changed with wt gain; also had memory issues with klonopin.  Has seen counseling but apparently was felt with first one that she could cope well already, so changed to a different counselor.  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  After that became more stressed.  Has been more emotional and angry.  Pt continues to have recurring LBP without change in severity, bowel or bladder change, fever, wt loss,  worsening LE pain/numbness/weakness, gait change or falls. Past Medical History:  Diagnosis Date  . Allergic rhinitis, cause unspecified   . Anemia   . Anxiety   . Depression   . Diabetes mellitus type II    steroid related, patient states "im no longer diabetic"  . Essential hypertension 08/08/2015  . GERD (gastroesophageal reflux disease)   . History of blood transfusion   . Hyperlipidemia   . Menorrhagia   . Migraine   . Morbid obesity ([place])   . Otitis media 06/28/2015  . Peptic ulcer disease   . Positive ANA (antinuclear antibody) 02/14/2012  . Sarcoid    including hand per rheumatology-Dr. Amil Amen  . Sarcoidosis of lung ([place])   . Shortness of breath    on exertion  . Varicose veins with pain    Past Surgical History:  Procedure Laterality Date  . ABDOMINAL HYSTERECTOMY    . COLONOSCOPY WITH PROPOFOL N/A 10/19/2016   Procedure: COLONOSCOPY WITH PROPOFOL;  Surgeon: Mauri Pole, MD;  Location: [facility];  Service: Endoscopy;  Laterality: N/A;  . ESOPHAGOGASTRODUODENOSCOPY (EGD) WITH PROPOFOL N/A 10/19/2016   Procedure: ESOPHAGOGASTRODUODENOSCOPY (EGD) WITH PROPOFOL;  Surgeon: Mauri Pole, MD;  Location: [facility];  Service: Endoscopy;  Laterality: N/A;  . HERNIA MESH REMOVAL  02/2013  . uterine ablation  03/2010  . WISDOM TOOTH EXTRACTION      reports that she quit smoking about 5 years ago. Her smoking use included cigarettes. She has a 20.00 pack-year smoking history. she has never used smokeless tobacco. She reports that she does not drink alcohol or use drugs. family history includes Allergies in her mother; Bone cancer in her maternal aunt; Breast cancer in her maternal aunt; Diabetes in her father and mother; Heart attack in her mother; Heart disease in her father; Hypertension in her unknown relative; Lung cancer in her maternal aunt; Ovarian cancer in her maternal aunt; Rheum arthritis in her father; Stroke in her father and maternal uncle. Allergies  Allergen Reactions  . Azithromycin Hives    (z pak) hives  . Bee Venom Anaphylaxis  . Flagyl [Metronidazole] Hives and Shortness Of Breath  . Penicillins Shortness Of Breath and Swelling    Has patient  had a PCN reaction causing immediate rash, facial/tongue/throat swelling, SOB or lightheadedness with hypotension: Yes Has patient had a PCN reaction causing severe rash involving mucus membranes or skin necrosis: No Has patient had a PCN reaction that required hospitalization No Has patient had a PCN reaction occurring within the last 10 years: No If all of the above answers are "NO", then may proceed with Cephalosporin use.  REACTION: swelling and difficulty  breathing  . Shellfish Allergy Anaphylaxis  . Klonopin [Clonazepam] Other (See Comments)    Memory difficulty   Current Outpatient Medications on File Prior to Visit  Medication Sig Dispense Refill  . ALPRAZolam (XANAX) 0.5 MG tablet Take 1 tablet (0.5 mg total) by mouth 3 (three) times daily as needed. 90 tablet 2  . azelastine (ASTELIN) 0.1 % nasal spray Place 2 sprays into both nostrils 2 (two) times daily. Use in each nostril as directed 30 mL 12  . Blood Glucose Monitoring Suppl (ONE TOUCH ULTRA 2) W/DEVICE KIT Use as directed four times daily. Dx: E11.9 1 each 0  . budesonide-formoterol (SYMBICORT) 160-4.5 MCG/ACT inhaler Inhale 2 puffs into the lungs 2 (two) times daily. 1 Inhaler 5  . cetirizine (ZYRTEC) 10 MG tablet TAKE 1 TABLET(10 MG) BY MOUTH DAILY 90 tablet 3  . cyclobenzaprine (FLEXERIL) 5 MG tablet Take 2 tablets (10 mg total) by mouth at bedtime as needed for muscle spasms.    . Diclofenac Sodium (PENNSAID) 2 % SOLN Apply 1 pump twice daily as needed. 112 g 3  . fluticasone (FLONASE) 50 MCG/ACT nasal spray Place 2 sprays into both nostrils daily. 16 g 0  . folic acid (FOLVITE) 1 MG tablet TAKE 2 TABLETS BY MOUTH DAILY 180 tablet 0  . furosemide (LASIX) 40 MG tablet 1 tab by mouth every day in the AM and then 1 as needed only for persistent swelling in the PM 60 tablet 11  . gabapentin (NEURONTIN) 300 MG capsule TAKE 1 CAPSULE(300 MG) BY MOUTH THREE TIMES DAILY 90 capsule 5  . glucose blood (ONE TOUCH ULTRA TEST) test strip Use to check blood sugars four times a day Dx E11.9 400 each 11  . insulin lispro (HUMALOG) 100 UNIT/ML injection INJECT UP TO 12 UNITS INTO THE SKIN THREE TIMES DAILY WITH MEALS WITH SLIDING SCALE AS DIRECTED 20 mL 11  . Insulin Syringe-Needle U-100 25G X 1" 1 ML MISC Use to administer humalog insulin three times a day Dx E11.9 100 each 5  . levalbuterol (XOPENEX HFA) 45 MCG/ACT inhaler Inhale 1-2 puffs into the lungs every 8 (eight) hours as needed for  wheezing or shortness of breath. USE 2 PUFFS 3 TIMES DAILY X 5 DAYS THEN BACK TO HOME REGIMEN AS NEEDED. 1 Inhaler 0  . methotrexate (RHEUMATREX) 2.5 MG tablet Take 15 mg by mouth every Tuesday.     . nystatin (MYCOSTATIN) 100000 UNIT/ML suspension Take 5 mLs (500,000 Units total) by mouth 3 (three) times daily. 150 mL 0  . omeprazole (PRILOSEC) 40 MG capsule Take 1 capsule (40 mg total) by mouth daily. 90 capsule 3  . ONE TOUCH ULTRA TEST test strip TEST BLOOD SUGAR FOUR TIMES DAILY 400 each 0  . oxyCODONE-acetaminophen (PERCOCET) 10-325 MG tablet Take 1 by mouth twice per day as neede 45 tablet 0  . potassium chloride (K-DUR) 10 MEQ tablet 3 tab by mouth per day only when taking the lasix (Patient taking differently: Take 10-20 mEq by mouth 2 (two) times daily. Take 10 meq in the morning  and 91mq at night) 90 tablet 11  . ranitidine (ZANTAC) 150 MG tablet TAKE 1 TABLET(150 MG) BY MOUTH AT BEDTIME 30 tablet 0  . sertraline (ZOLOFT) 100 MG tablet TAKE 2 TABLETS BY MOUTH DAILY 180 tablet 3  . SUMAtriptan (IMITREX) 100 MG tablet Take 1 tablet by mouth at the onset of a headache. May repeat in 2 hours if headache persists or recurs. 10 tablet 2  . telmisartan (MICARDIS) 40 MG tablet TAKE 1 TABLET(40 MG) BY MOUTH DAILY 90 tablet 0  . TURMERIC PO Take 1 tablet by mouth daily.    . vitamin B-12 (CYANOCOBALAMIN) 1000 MCG tablet Take 1,000 mcg by mouth daily.    . Vitamin D, Ergocalciferol, (DRISDOL) 50000 units CAPS capsule TAKE 1 CAPSULE BY MOUTH EVERY 7 DAYS 12 capsule 0  . zonisamide (ZONEGRAN) 100 MG capsule Take 1 capsule (100 mg total) by mouth daily. 90 capsule 0   No current facility-administered medications on file prior to visit.    Review of Systems  Constitutional: Negative for other unusual diaphoresis or sweats HENT: Negative for ear discharge or swelling Eyes: Negative for other worsening visual disturbances Respiratory: Negative for stridor or other swelling  Gastrointestinal:  Negative for worsening distension or other blood Genitourinary: Negative for retention or other urinary change Musculoskeletal: Negative for other MSK pain or swelling Skin: Negative for color change or other new lesions Neurological: Negative for worsening tremors and other numbness  Psychiatric/Behavioral: Negative for worsening agitation or other fatigue' \All'  other system neg per pt    Objective:   Physical Exam BP 126/84   Pulse 89   Temp 98.1 F (36.7 C) (Oral)   Ht '5\' 7"'  (1.702 m)   Wt (!) 327 lb (148.3 kg)   LMP 07/17/2012   SpO2 98%   BMI 51.22 kg/m  VS noted,  Constitutional: Pt appears in NAD HENT: Head: NCAT.  Right Ear: External ear normal.  Left Ear: External ear normal.  Eyes: . Pupils are equal, round, and reactive to light. Conjunctivae and EOM are normal Nose: without d/c or deformity Neck: Neck supple. Gross normal ROM Cardiovascular: Normal rate and regular rhythm.   Pulmonary/Chest: Effort normal and breath sounds without rales or wheezing.  Abd:  Soft, NT, ND, + BS, no organomegaly Neurological: Pt is alert. At baseline orientation, motor grossly intact Skin: Skin is warm. No rashes, other new lesions, no LE edema Psychiatric: Pt behavior is normal without agitation , + depressed nervous affect Lab Results  Component Value Date   WBC 6.8 11/08/2017   HGB 12.7 11/08/2017   HCT 38.8 11/08/2017   PLT 303.0 11/08/2017   GLUCOSE 139 (H) 11/08/2017   CHOL 177 11/08/2017   TRIG 78.0 11/08/2017   HDL 45.40 11/08/2017   LDLDIRECT 135.0 07/05/2011   LDLCALC 116 (H) 11/08/2017   ALT 10 11/08/2017   AST 12 11/08/2017   NA 140 11/08/2017   K 3.7 11/08/2017   CL 105 11/08/2017   CREATININE 0.73 11/08/2017   BUN 6 11/08/2017   CO2 28 11/08/2017   TSH 1.59 06/15/2017   INR 1.3 11/17/2012   HGBA1C 7.1 (H) 11/08/2017   MICROALBUR 0.7 06/15/2017         Assessment & Plan:

## 2017-11-08 NOTE — Assessment & Plan Note (Addendum)
Uncontrolled, no SI or HI, to add wellbutrin xl 150  Note:  Total time for pt hx, exam, review of record with pt in the room, determination of diagnoses and plan for further eval and tx is > 40 min, with over 50% spent in coordination and counseling of patient including the differential dx, tx, further evaluation and other management of anxiety, depression, DM, HTN, HLD and iron deficiency

## 2017-11-08 NOTE — Assessment & Plan Note (Signed)
Also f/u iron level, cbc,  to f/u any worsening symptoms or concerns

## 2017-11-08 NOTE — Assessment & Plan Note (Signed)
For atarax 10 tid prn,  to f/u any worsening symptoms or concerns

## 2017-11-08 NOTE — Telephone Encounter (Signed)
error 

## 2017-11-08 NOTE — Assessment & Plan Note (Signed)
Goal ldl < 70, for f/u lab today

## 2017-11-08 NOTE — Telephone Encounter (Signed)
Called pt, LVM.   CRM created.  

## 2017-11-17 ENCOUNTER — Encounter: Payer: Self-pay | Admitting: Internal Medicine

## 2017-11-17 ENCOUNTER — Other Ambulatory Visit: Payer: Self-pay | Admitting: Neurology

## 2017-11-17 ENCOUNTER — Ambulatory Visit (INDEPENDENT_AMBULATORY_CARE_PROVIDER_SITE_OTHER): Payer: BLUE CROSS/BLUE SHIELD | Admitting: Internal Medicine

## 2017-11-17 VITALS — BP 122/84 | HR 94 | Temp 98.0°F | Ht 67.0 in | Wt 325.0 lb

## 2017-11-17 DIAGNOSIS — M0520 Rheumatoid vasculitis with rheumatoid arthritis of unspecified site: Secondary | ICD-10-CM

## 2017-11-17 DIAGNOSIS — I10 Essential (primary) hypertension: Secondary | ICD-10-CM

## 2017-11-17 DIAGNOSIS — E1140 Type 2 diabetes mellitus with diabetic neuropathy, unspecified: Secondary | ICD-10-CM | POA: Diagnosis not present

## 2017-11-17 DIAGNOSIS — G8929 Other chronic pain: Secondary | ICD-10-CM

## 2017-11-17 DIAGNOSIS — I00 Rheumatic fever without heart involvement: Secondary | ICD-10-CM | POA: Diagnosis not present

## 2017-11-17 DIAGNOSIS — Z794 Long term (current) use of insulin: Secondary | ICD-10-CM

## 2017-11-17 MED ORDER — METHOTREXATE (PF) 10 MG/0.4ML ~~LOC~~ SOAJ: 10.0000 mg | SUBCUTANEOUS | 1 refills | Status: DC

## 2017-11-17 NOTE — Patient Instructions (Addendum)
Ok to restart the injectable MTX  You will be contacted regarding the referral for: Rheumatology, and Pain management  Please continue all other medications as before, and refills have been done if requested.  Please have the pharmacy call with any other refills you may need.  Please continue your efforts at being more active, low cholesterol diet, and weight control  Please keep your appointments with your specialists as you may have planned

## 2017-11-17 NOTE — Progress Notes (Signed)
Subjective:    Patient ID: Nicole Melendez, female    DOB: 1976/04/29, 42 y.o.   MRN: [id number]  HPI   Here to f/u; overall doing ok,  Pt denies chest pain, increasing sob or doe, wheezing, orthopnea, PND, increased LE swelling, palpitations, dizziness or syncope.  Pt denies new neurological symptoms such as new headache, or facial or extremity weakness or numbness.  Pt denies polydipsia, polyuria, or low sugar episode.  Pt states overall good compliance with meds, mostly trying to follow appropriate diet, with wt overall stable,  but little exercise however, as RA has been active, asks for restart IM MTX with f/u with new rheumatology as last has left practice.  Also has been let go from pain management for unclear reasons. Pt continues to have recurring mod to severe LBP without change in severity, bowel or bladder change, fever, wt loss,  worsening LE pain/numbness/weakness, gait change or falls. Past Medical History:  Diagnosis Date  . Allergic rhinitis, cause unspecified   . Anemia   . Anxiety   . Depression   . Diabetes mellitus type II    steroid related, patient states "im no longer diabetic"  . Essential hypertension 08/08/2015  . GERD (gastroesophageal reflux disease)   . History of blood transfusion   . Hyperlipidemia   . Menorrhagia   . Migraine   . Morbid obesity ([place])   . Otitis media 06/28/2015  . Peptic ulcer disease   . Positive ANA (antinuclear antibody) 02/14/2012  . Sarcoid    including hand per rheumatology-Dr. Amil Amen  . Sarcoidosis of lung ([place])   . Shortness of breath    on exertion  . Varicose veins with pain    Past Surgical History:  Procedure Laterality Date  . ABDOMINAL HYSTERECTOMY    . COLONOSCOPY WITH PROPOFOL N/A 10/19/2016   Procedure: COLONOSCOPY WITH PROPOFOL;  Surgeon: Mauri Pole, MD;  Location: [facility];  Service: Endoscopy;  Laterality: N/A;  . ESOPHAGOGASTRODUODENOSCOPY (EGD) WITH PROPOFOL N/A 10/19/2016   Procedure:  ESOPHAGOGASTRODUODENOSCOPY (EGD) WITH PROPOFOL;  Surgeon: Mauri Pole, MD;  Location: [facility];  Service: Endoscopy;  Laterality: N/A;  . HERNIA MESH REMOVAL  02/2013  . uterine ablation  03/2010  . WISDOM TOOTH EXTRACTION      reports that she quit smoking about 5 years ago. Her smoking use included cigarettes. She has a 20.00 pack-year smoking history. she has never used smokeless tobacco. She reports that she does not drink alcohol or use drugs. family history includes Allergies in her mother; Bone cancer in her maternal aunt; Breast cancer in her maternal aunt; Diabetes in her father and mother; Heart attack in her mother; Heart disease in her father; Hypertension in her unknown relative; Lung cancer in her maternal aunt; Ovarian cancer in her maternal aunt; Rheum arthritis in her father; Stroke in her father and maternal uncle. Allergies  Allergen Reactions  . Azithromycin Hives    (z pak) hives  . Bee Venom Anaphylaxis  . Flagyl [Metronidazole] Hives and Shortness Of Breath  . Penicillins Shortness Of Breath and Swelling    Has patient had a PCN reaction causing immediate rash, facial/tongue/throat swelling, SOB or lightheadedness with hypotension: Yes Has patient had a PCN reaction causing severe rash involving mucus membranes or skin necrosis: No Has patient had a PCN reaction that required hospitalization No Has patient had a PCN reaction occurring within the last 10 years: No If all of the above answers are "NO", then may proceed with  Cephalosporin use.  REACTION: swelling and difficulty breathing  . Shellfish Allergy Anaphylaxis  . Klonopin [Clonazepam] Other (See Comments)    Memory difficulty  . Tramadol    Current Outpatient Medications on File Prior to Visit  Medication Sig Dispense Refill  . ALPRAZolam (XANAX) 0.5 MG tablet Take 1 tablet (0.5 mg total) by mouth 3 (three) times daily as needed. 90 tablet 2  . azelastine (ASTELIN) 0.1 % nasal spray Place 2 sprays  into both nostrils 2 (two) times daily. Use in each nostril as directed 30 mL 12  . Blood Glucose Monitoring Suppl (ONE TOUCH ULTRA 2) W/DEVICE KIT Use as directed four times daily. Dx: E11.9 1 each 0  . budesonide-formoterol (SYMBICORT) 160-4.5 MCG/ACT inhaler Inhale 2 puffs into the lungs 2 (two) times daily. 1 Inhaler 5  . buPROPion (WELLBUTRIN XL) 150 MG 24 hr tablet Take 1 tablet (150 mg total) by mouth daily. 90 tablet 3  . cetirizine (ZYRTEC) 10 MG tablet TAKE 1 TABLET(10 MG) BY MOUTH DAILY 90 tablet 3  . cyclobenzaprine (FLEXERIL) 5 MG tablet Take 2 tablets (10 mg total) by mouth at bedtime as needed for muscle spasms.    . Diclofenac Sodium (PENNSAID) 2 % SOLN Apply 1 pump twice daily as needed. 112 g 3  . fluticasone (FLONASE) 50 MCG/ACT nasal spray Place 2 sprays into both nostrils daily. 16 g 0  . folic acid (FOLVITE) 1 MG tablet TAKE 2 TABLETS BY MOUTH DAILY 180 tablet 0  . furosemide (LASIX) 40 MG tablet 1 tab by mouth every day in the AM and then 1 as needed only for persistent swelling in the PM 60 tablet 11  . gabapentin (NEURONTIN) 300 MG capsule TAKE 1 CAPSULE(300 MG) BY MOUTH THREE TIMES DAILY 90 capsule 5  . glucose blood (ONE TOUCH ULTRA TEST) test strip Use to check blood sugars four times a day Dx E11.9 400 each 11  . hydrOXYzine (ATARAX/VISTARIL) 10 MG tablet Take 1 tablet (10 mg total) by mouth 3 (three) times daily as needed. 60 tablet 2  . insulin lispro (HUMALOG) 100 UNIT/ML injection INJECT UP TO 12 UNITS INTO THE SKIN THREE TIMES DAILY WITH MEALS WITH SLIDING SCALE AS DIRECTED 20 mL 11  . Insulin Syringe-Needle U-100 25G X 1" 1 ML MISC Use to administer humalog insulin three times a day Dx E11.9 100 each 5  . levalbuterol (XOPENEX HFA) 45 MCG/ACT inhaler Inhale 1-2 puffs into the lungs every 8 (eight) hours as needed for wheezing or shortness of breath. USE 2 PUFFS 3 TIMES DAILY X 5 DAYS THEN BACK TO HOME REGIMEN AS NEEDED. 1 Inhaler 0  . methotrexate (RHEUMATREX) 2.5  MG tablet Take 15 mg by mouth every Tuesday.     . nystatin (MYCOSTATIN) 100000 UNIT/ML suspension Take 5 mLs (500,000 Units total) by mouth 3 (three) times daily. 150 mL 0  . omeprazole (PRILOSEC) 40 MG capsule Take 1 capsule (40 mg total) by mouth daily. 90 capsule 3  . ONE TOUCH ULTRA TEST test strip TEST BLOOD SUGAR FOUR TIMES DAILY 400 each 0  . oxyCODONE-acetaminophen (PERCOCET) 10-325 MG tablet Take 1 by mouth twice per day as neede 45 tablet 0  . potassium chloride (K-DUR) 10 MEQ tablet 3 tab by mouth per day only when taking the lasix (Patient taking differently: Take 10-20 mEq by mouth 2 (two) times daily. Take 10 meq in the morning and 40mq at night) 90 tablet 11  . ranitidine (ZANTAC) 150 MG tablet TAKE 1  TABLET(150 MG) BY MOUTH AT BEDTIME 30 tablet 0  . rosuvastatin (CRESTOR) 40 MG tablet Take 1 tablet (40 mg total) by mouth daily. 90 tablet 3  . sertraline (ZOLOFT) 100 MG tablet TAKE 2 TABLETS BY MOUTH DAILY 180 tablet 3  . SUMAtriptan (IMITREX) 100 MG tablet Take 1 tablet by mouth at the onset of a headache. May repeat in 2 hours if headache persists or recurs. 10 tablet 2  . telmisartan (MICARDIS) 40 MG tablet TAKE 1 TABLET(40 MG) BY MOUTH DAILY 90 tablet 0  . TURMERIC PO Take 1 tablet by mouth daily.    . vitamin B-12 (CYANOCOBALAMIN) 1000 MCG tablet Take 1,000 mcg by mouth daily.    . Vitamin D, Ergocalciferol, (DRISDOL) 50000 units CAPS capsule TAKE 1 CAPSULE BY MOUTH EVERY 7 DAYS 12 capsule 0   No current facility-administered medications on file prior to visit.    Review of Systems  Constitutional: Negative for other unusual diaphoresis or sweats HENT: Negative for ear discharge or swelling Eyes: Negative for other worsening visual disturbances Respiratory: Negative for stridor or other swelling  Gastrointestinal: Negative for worsening distension or other blood Genitourinary: Negative for retention or other urinary change Musculoskeletal: Negative for other MSK pain or  swelling Skin: Negative for color change or other new lesions Neurological: Negative for worsening tremors and other numbness  Psychiatric/Behavioral: Negative for worsening agitation or other fatigue All other system neg per pt    Objective:   Physical Exam BP 122/84   Pulse 94   Temp 98 F (36.7 C) (Oral)   Ht _0  (1.702 m)   Wt (!) 325 lb (147.4 kg)   LMP 07/17/2012   SpO2 99%   BMI 50.90 kg/m  VS noted,  Constitutional: Pt appears in NAD HENT: Head: NCAT.  Right Ear: External ear normal.  Left Ear: External ear normal.  Eyes: . Pupils are equal, round, and reactive to light. Conjunctivae and EOM are normal Nose: without d/c or deformity Neck: Neck supple. Gross normal ROM Cardiovascular: Normal rate and regular rhythm.   Pulmonary/Chest: Effort normal and breath sounds without rales or wheezing.  Abd:  Soft, NT, ND, + BS, no organomegaly Joint: tender slight swelling bilat MCP's Spine:  Diffuse low midline and paravertebral tenderness Neurological: Pt is alert. At baseline orientation, motor grossly intact Skin: Skin is warm. No rashes, other new lesions, no LE edema Psychiatric: Pt behavior is normal without agitation  No other exam findings     Assessment & Plan:

## 2017-11-18 ENCOUNTER — Other Ambulatory Visit: Payer: Self-pay | Admitting: *Deleted

## 2017-11-18 MED ORDER — ZONISAMIDE 100 MG PO CAPS: 100.0000 mg | ORAL_CAPSULE | Freq: Every day | ORAL | 1 refills | Status: DC

## 2017-11-20 ENCOUNTER — Encounter: Payer: Self-pay | Admitting: Internal Medicine

## 2017-11-20 NOTE — Assessment & Plan Note (Signed)
stable overall by history and exam, recent data reviewed with pt, and pt to continue medical treatment as before,  to f/u any worsening symptoms or concerns BP Readings from Last 3 Encounters:  11/17/17 122/84  11/08/17 126/84  10/11/17 (!) 142/88

## 2017-11-20 NOTE — Assessment & Plan Note (Signed)
D/w pt, I do not treat chronic pain in my practice with sched II or higher meds.  OK for retry pain management referral

## 2017-11-20 NOTE — Assessment & Plan Note (Signed)
Lab Results  Component Value Date   HGBA1C 7.1 (H) 11/08/2017  stable overall by history and exam, recent data reviewed with pt, and pt to continue medical treatment as before,  to f/u any worsening symptoms or concerns

## 2017-11-20 NOTE — Assessment & Plan Note (Signed)
Mild current active, ok for IM MTX restart as before but will need rheum referral to f/u

## 2017-11-22 ENCOUNTER — Ambulatory Visit: Payer: Self-pay | Admitting: Internal Medicine

## 2017-11-22 DIAGNOSIS — Z0289 Encounter for other administrative examinations: Secondary | ICD-10-CM

## 2017-11-25 ENCOUNTER — Ambulatory Visit: Payer: Self-pay | Admitting: Internal Medicine

## 2017-11-26 ENCOUNTER — Ambulatory Visit: Payer: BLUE CROSS/BLUE SHIELD | Admitting: Internal Medicine

## 2017-11-26 ENCOUNTER — Encounter: Payer: Self-pay | Admitting: Internal Medicine

## 2017-11-26 VITALS — BP 126/70 | HR 94 | Temp 97.9°F | Ht 67.0 in | Wt 323.2 lb

## 2017-11-26 DIAGNOSIS — J40 Bronchitis, not specified as acute or chronic: Secondary | ICD-10-CM

## 2017-11-26 MED ORDER — PREDNISONE 10 MG PO TABS: ORAL_TABLET | ORAL | 0 refills | Status: DC

## 2017-11-26 MED ORDER — DOXYCYCLINE HYCLATE 100 MG PO TABS: 100.0000 mg | ORAL_TABLET | Freq: Two times a day (BID) | ORAL | 0 refills | Status: DC

## 2017-11-26 NOTE — Progress Notes (Signed)
Subjective:    Patient ID: Nicole Melendez, female    DOB: [birth year], 42 y.o.   MRN: [id number]  DOS:  11/26/2017 Type of visit - description : acute Interval history: Sx started ~ 4 days ago w/ sneezing, RN, head congestion. She also had cough with thick, yellow sputum.  Few traces of blood noted as well. She took some Sudafed. The next day, 11/25/2017, she felt worse, symptoms increased and she started to hurt all over. She had some loose stools.   Review of Systems Had a temperature of 101 yesterday. Today she is afebrile at the time of the exam at 10:30 AM, took Tylenol at 3 AM.  No recent ibuprofen.  Has chronic nausea, unchanged, no vomiting. Did not get a flu shot.   Past Medical History:  Diagnosis Date  . Allergic rhinitis, cause unspecified   . Anemia   . Anxiety   . Depression   . Diabetes mellitus type II    steroid related, patient states "im no longer diabetic"  . Essential hypertension 08/08/2015  . GERD (gastroesophageal reflux disease)   . History of blood transfusion   . Hyperlipidemia   . Menorrhagia   . Migraine   . Morbid obesity ([facility])   . Otitis media 06/28/2015  . Peptic ulcer disease   . Positive ANA (antinuclear antibody) 02/14/2012  . Sarcoid    including hand per rheumatology-Dr. Amil Amen  . Sarcoidosis of lung ([place])   . Shortness of breath    on exertion  . Varicose veins with pain     Past Surgical History:  Procedure Laterality Date  . ABDOMINAL HYSTERECTOMY    . COLONOSCOPY WITH PROPOFOL N/A 10/19/2016   Procedure: COLONOSCOPY WITH PROPOFOL;  Surgeon: Mauri Pole, MD;  Location: [facility];  Service: Endoscopy;  Laterality: N/A;  . ESOPHAGOGASTRODUODENOSCOPY (EGD) WITH PROPOFOL N/A 10/19/2016   Procedure: ESOPHAGOGASTRODUODENOSCOPY (EGD) WITH PROPOFOL;  Surgeon: Mauri Pole, MD;  Location: [facility];  Service: Endoscopy;  Laterality: N/A;  . HERNIA MESH REMOVAL  02/2013  . uterine ablation  03/2010  . WISDOM TOOTH  EXTRACTION      Social History   Socioeconomic History  . Marital status: Single    Spouse name: Not on file  . Number of children: 0  . Years of education: Not on file  . Highest education level: Not on file  Social Needs  . Financial resource strain: Not on file  . Food insecurity - worry: Not on file  . Food insecurity - inability: Not on file  . Transportation needs - medical: Not on file  . Transportation needs - non-medical: Not on file  Occupational History  . Occupation: personal shopper  Tobacco Use  . Smoking status: Former Smoker    Packs/day: 1.00    Years: 20.00    Pack years: 20.00    Types: Cigarettes    Last attempt to quit: 10/25/2012    Years since quitting: 5.0  . Smokeless tobacco: Never Used  . Tobacco comment: QUIT 04/2010 AND STARTED BACK 2014 X 3 MONTHS. less than 1 ppd.  started at age 66.    Substance and Sexual Activity  . Alcohol use: No  . Drug use: No  . Sexual activity: Yes    Birth control/protection: None  Other Topics Concern  . Not on file  Social History Narrative   Pt lives with [name]- also pt of [facility]      Allergies as of 11/26/2017  Reactions   Azithromycin Hives   (z pak) hives   Bee Venom Anaphylaxis   Flagyl [metronidazole] Hives, Shortness Of Breath   Penicillins Shortness Of Breath, Swelling   Has patient had a PCN reaction causing immediate rash, facial/tongue/throat swelling, SOB or lightheadedness with hypotension: Yes Has patient had a PCN reaction causing severe rash involving mucus membranes or skin necrosis: No Has patient had a PCN reaction that required hospitalization No Has patient had a PCN reaction occurring within the last 10 years: No If all of the above answers are "NO", then may proceed with Cephalosporin use. REACTION: swelling and difficulty breathing   Shellfish Allergy Anaphylaxis   Klonopin [clonazepam] Other (See Comments)   Memory difficulty   Tramadol       Medication List         Accurate as of 11/26/17 12:00 PM. Always use your most recent med list.          ALPRAZolam 0.5 MG tablet Commonly known as:  XANAX Take 1 tablet (0.5 mg total) by mouth 3 (three) times daily as needed.   azelastine 0.1 % nasal spray Commonly known as:  ASTELIN Place 2 sprays into both nostrils 2 (two) times daily. Use in each nostril as directed   budesonide-formoterol 160-4.5 MCG/ACT inhaler Commonly known as:  SYMBICORT Inhale 2 puffs into the lungs 2 (two) times daily.   buPROPion 150 MG 24 hr tablet Commonly known as:  WELLBUTRIN XL Take 1 tablet (150 mg total) by mouth daily.   cetirizine 10 MG tablet Commonly known as:  ZYRTEC TAKE 1 TABLET(10 MG) BY MOUTH DAILY   Diclofenac Sodium 2 % Soln Commonly known as:  PENNSAID Apply 1 pump twice daily as needed.   doxycycline 100 MG tablet Commonly known as:  VIBRA-TABS Take 1 tablet (100 mg total) by mouth 2 (two) times daily.   fluticasone 50 MCG/ACT nasal spray Commonly known as:  FLONASE Place 2 sprays into both nostrils daily.   folic acid 1 MG tablet Commonly known as:  FOLVITE TAKE 2 TABLETS BY MOUTH DAILY   furosemide 40 MG tablet Commonly known as:  LASIX 1 tab by mouth every day in the AM and then 1 as needed only for persistent swelling in the PM   gabapentin 300 MG capsule Commonly known as:  NEURONTIN TAKE 1 CAPSULE(300 MG) BY MOUTH THREE TIMES DAILY   glucose blood test strip Commonly known as:  ONE TOUCH ULTRA TEST Use to check blood sugars four times a day Dx E11.9   ONE TOUCH ULTRA TEST test strip Generic drug:  glucose blood TEST BLOOD SUGAR FOUR TIMES DAILY   hydrOXYzine 10 MG tablet Commonly known as:  ATARAX/VISTARIL Take 1 tablet (10 mg total) by mouth 3 (three) times daily as needed.   insulin lispro 100 UNIT/ML injection Commonly known as:  HUMALOG INJECT UP TO 12 UNITS INTO THE SKIN THREE TIMES DAILY WITH MEALS WITH SLIDING SCALE AS DIRECTED   Insulin Syringe-Needle U-100 25G X 1"  1 ML Misc Use to administer humalog insulin three times a day Dx E11.9   levalbuterol 45 MCG/ACT inhaler Commonly known as:  XOPENEX HFA Inhale 1-2 puffs into the lungs every 8 (eight) hours as needed for wheezing or shortness of breath. USE 2 PUFFS 3 TIMES DAILY X 5 DAYS THEN BACK TO HOME REGIMEN AS NEEDED.   Methotrexate (PF) 10 MG/0.4ML Soaj Inject 10 mg into the skin once a week.   methotrexate 2.5 MG tablet Commonly known  as:  RHEUMATREX Take 15 mg by mouth every Tuesday.   omeprazole 40 MG capsule Commonly known as:  PRILOSEC Take 1 capsule (40 mg total) by mouth daily.   ONE TOUCH ULTRA 2 w/Device Kit Use as directed four times daily. Dx: E11.9   potassium chloride 10 MEQ tablet Commonly known as:  K-DUR 3 tab by mouth per day only when taking the lasix   predniSONE 10 MG tablet Commonly known as:  DELTASONE 2 tabs a day x 5 days   ranitidine 150 MG tablet Commonly known as:  ZANTAC TAKE 1 TABLET(150 MG) BY MOUTH AT BEDTIME   sertraline 100 MG tablet Commonly known as:  ZOLOFT TAKE 2 TABLETS BY MOUTH DAILY   SUMAtriptan 100 MG tablet Commonly known as:  IMITREX Take 1 tablet by mouth at the onset of a headache. May repeat in 2 hours if headache persists or recurs.   telmisartan 40 MG tablet Commonly known as:  MICARDIS TAKE 1 TABLET(40 MG) BY MOUTH DAILY   TURMERIC PO Take 1 tablet by mouth daily.   vitamin B-12 1000 MCG tablet Commonly known as:  CYANOCOBALAMIN Take 1,000 mcg by mouth daily.   Vitamin D (Ergocalciferol) 50000 units Caps capsule Commonly known as:  DRISDOL TAKE 1 CAPSULE BY MOUTH EVERY 7 DAYS   zonisamide 100 MG capsule Commonly known as:  ZONEGRAN Take 1 capsule (100 mg total) by mouth daily.          Objective:   Physical Exam BP 126/70 (BP Location: Left Arm, Patient Position: Sitting, Cuff Size: Large)   Pulse 94   Temp 97.9 F (36.6 C) (Oral)   Ht '5\' 7"'  (1.702 m)   Wt (!) 323 lb 4 oz (146.6 kg)   LMP 07/17/2012    SpO2 97%   BMI 50.63 kg/m  General:   Well developed, morbidly obese appearing, in no distress, speaking complete sentences. HEENT:  Normocephalic . Face symmetric, atraumatic. TMs: Bulge bilaterally.  Nose congested, sinuses non-TTP. Lungs:  Rhonchi bilaterally, slightly increased expiratory time Normal respiratory effort, no intercostal retractions, no accessory muscle use. Heart: RRR,  no murmur.  No pretibial edema bilaterally  Skin: Not pale. Not jaundice Neurologic:  alert & oriented X3.  Speech normal, gait appropriate for age and unassisted Psych--  Cognition and judgment appear intact.  Cooperative with normal attention span and concentration.  Behavior appropriate. No anxious or depressed appearing.      Assessment & Plan:   42 year old female with multiple medical problems including CHF, hypertension, history of pulmonary sarcoidosis, on MTX, PUD, DM on insulin, morbid obesity presents w/:  Bronchitis: Symptoms consistent with bronchitis in the context of chronic lung disease. No pneumonia on clinical grounds, no acutely ill, not hypoxic. We will treat with doxycycline, low-dose of prednisone, Mucinex, Symbicort twice a day, encouraged to use Xopenex for persistent cough. Doxycycline may interact with methotrexate, she was due to get a injection yesterday but did not.  Recommend to take the next methotrexate dose in 1 week. CBGs are running okay at around 100,   anticipate CBGs will increase her prednisone, patient tells me she is very aware on how to use her SS insulin. Call if not better

## 2017-11-26 NOTE — Patient Instructions (Signed)
Rest, fluids , tylenol  For cough:  Take Mucinex DM twice a day as needed until better  For nasal congestion: Use OTC Nasocort or Flonase : 2 nasal sprays on each side of the nose in the morning until you feel better   Avoid decongestants such as  Pseudoephedrine or phenylephrine    Take the antibiotic as prescribed: DOXY  Take prednisone as prescribed  Hold methotrexate until  next Friday.  Use insulin according to your sliding scale  Call if not gradually better over the next  10 days  Call anytime if the symptoms are severe

## 2017-12-01 ENCOUNTER — Encounter: Payer: Self-pay | Admitting: Internal Medicine

## 2017-12-01 ENCOUNTER — Telehealth: Payer: Self-pay | Admitting: Internal Medicine

## 2017-12-01 NOTE — Telephone Encounter (Signed)
Copied from [place]. Topic: Quick Communication - See Telephone Encounter >> Dec 01, 2017  8:36 AM Synthia Innocent wrote: CRM for notification. See Telephone encounter for: Patient dropped off FMLA paperwork yesterday, she is feeling better today and would like to go back to work at [employer]. Needs paperwork completed as well as a return to work note  12/01/17.

## 2017-12-01 NOTE — Telephone Encounter (Signed)
Ok with me 

## 2017-12-01 NOTE — Telephone Encounter (Signed)
Dr. Jenny Reichmann are you ok with signing off on the FMLA?

## 2017-12-01 NOTE — Telephone Encounter (Signed)
I don't recall telling her to be out of work until 11-30-17, see note, did tell her to call if no better in 10 days. If she was unable to work till 11-30-17 a FMLA is ok w/ me.

## 2017-12-01 NOTE — Telephone Encounter (Signed)
[facility] was [facility] by Dr. Larose Kells on 02.02.19. She states he informed her she could be out of work from 02.01.19 to 02.06.19. She is asking for FMLA to be filled out for this. I do not see where Dr. Larose Kells gave her a note or documented this.   Dr. Larose Kells was this ok with you, and I can get Dr. Jenny Reichmann to sign off on the forms?

## 2017-12-05 DIAGNOSIS — Z0279 Encounter for issue of other medical certificate: Secondary | ICD-10-CM

## 2017-12-05 NOTE — Telephone Encounter (Signed)
Forms have been completed & signed, sent to [employer] for.   LVM to inform patient they are ready to pick up.

## 2017-12-15 ENCOUNTER — Encounter: Payer: Self-pay | Admitting: Internal Medicine

## 2017-12-15 LAB — HM DIABETES EYE EXAM

## 2017-12-20 ENCOUNTER — Encounter: Payer: Self-pay | Admitting: Neurology

## 2017-12-20 ENCOUNTER — Ambulatory Visit: Payer: BLUE CROSS/BLUE SHIELD | Admitting: Neurology

## 2017-12-20 VITALS — BP 110/80 | HR 116 | Ht 67.0 in | Wt 324.4 lb

## 2017-12-20 DIAGNOSIS — G43009 Migraine without aura, not intractable, without status migrainosus: Secondary | ICD-10-CM

## 2017-12-20 NOTE — Progress Notes (Signed)
NEUROLOGY FOLLOW UP OFFICE NOTE  Nicole Melendez [id number]  HISTORY OF PRESENT ILLNESS: Nicole Melendez is a 42 year old right-handed woman with diabetes with neuropathy, GERD, PUD, migraine, sarcoid, hyperlipidemia, and HTN who follows up for migraine.   UPDATE: She is doing very well.  No headaches since last visit.  Intensity:  N/A but typically 7/10 Duration:  N/A but typically 1 hour with sumatriptan Frequency:  0 in past 6 months. Frequency of abortive medication: once a month Current NSAIDS:  No (caused ulcers) Current analgesics:  Tylenol  Current triptans:  sumatriptan 159m (effective) Current anti-emetic:  Zofran 47mCurrent muscle relaxants:  no Current anti-anxiolytic:  Alprazolam, hydroxyzine Current sleep aide:  no Current Antihypertensive medications:  Lasix Current Antidepressant medications:  sertraline 2003mWellbutrin XR 150m13mrrent Anticonvulsant medications:  zonisamide 100mg38mbapentin 300mg 68me times daily Current Vitamins/Herbal/Supplements:  folic acid, D3 Current Antihistamines/Decongestants:  Zyrtec Other therapy:  no Other medication:  methotrexate   Caffeine:  no Alcohol:  no Smoker:  Stopped Diet:  Hydrates, baked chicken, steamed vegetables Exercise:  yes Depression:  Yes but controlled; Anxiety:  Yes but controlled Sleep hygiene:  good  HISTORY: Onset:  Mid-30s Location:  Back of head, right sided Quality:  pounding Initial Intensity:  8-9/10 Aura:  no Prodrome:  no Associated symptoms:  Nausea, vomiting, osmophobia, right eye blurred vision, sometimes photophobia.  There is no associated unilateral numbness or weakness. Initial Duration:  2 days (within one hour with sumatriptan) Initial Frequency:  Varies.  Some months 1-2 days, some months 10 days Triggers/exacerbating factors:  no Relieving factors:  no Activity:  Cannot function 1 to 2 days per month   Past NSAIDS:  Ibuprofen, naproxen (discontinued due to stomach  ulcers) Past analgesics:  Percocet, tramadol (for back pain) Past abortive triptans:  no Past muscle relaxants:  tizanidine 4mg Pa5manti-emetic:  Phenergan, Zofran (effective) Past antihypertensive medications:  no Past antidepressant medications:  no Past anticonvulsant medications:  topiramate (caused nausea and vomiting) Past vitamins/Herbal/Supplements:  no Other past therapies:  meditation  Family history of headache:  mother  PAST MEDICAL HISTORY: Past Medical History:  Diagnosis Date  . Allergic rhinitis, cause unspecified   . Anemia   . Anxiety   . Depression   . Diabetes mellitus type II    steroid related, patient states "im no longer diabetic"  . Essential hypertension 08/08/2015  . GERD (gastroesophageal reflux disease)   . History of blood transfusion   . Hyperlipidemia   . Menorrhagia   . Migraine   . Morbid obesity ([facility])   [place]  . Peptic ulcer disease   . Positive ANA (antinuclear antibody) 02/14/2012  . Sarcoid    including hand per rheumatology-Dr. BeekmanAmil Amencoidosis of lung ([facility])   [place]    on exert[place]  . Varicose veins with pain     MEDICATIONS: Current Outpatient Medications on File Prior to Visit  Medication Sig Dispense Refill  . ALPRAZolam (XANAX) 0.5 MG tablet Take 1 tablet (0.5 mg total) by mouth 3 (three) times daily as needed. 90 tablet 2  . azelastine (ASTELIN) 0.1 % nasal spray Place 2 sprays into both nostrils 2 (two) times daily. Use in each nostril as directed 30 mL 12  . Blood Glucose Monitoring Suppl (ONE TOUCH ULTRA 2) W/DEVICE KIT Use as directed four times daily. Dx: E11.9 1 each 0  . budesonide-formoterol (SYMBICORT) 160-4.5 MCG/ACT inhaler Inhale 2 puffs into the lungs  2 (two) times daily. 1 Inhaler 5  . buPROPion (WELLBUTRIN XL) 150 MG 24 hr tablet Take 1 tablet (150 mg total) by mouth daily. 90 tablet 3  . cetirizine (ZYRTEC) 10 MG tablet TAKE 1 TABLET(10 MG) BY MOUTH DAILY 90 tablet 3  .  Diclofenac Sodium (PENNSAID) 2 % SOLN Apply 1 pump twice daily as needed. 112 g 3  . doxycycline (VIBRA-TABS) 100 MG tablet Take 1 tablet (100 mg total) by mouth 2 (two) times daily. 15 tablet 0  . fluticasone (FLONASE) 50 MCG/ACT nasal spray Place 2 sprays into both nostrils daily. 16 g 0  . folic acid (FOLVITE) 1 MG tablet TAKE 2 TABLETS BY MOUTH DAILY 180 tablet 0  . furosemide (LASIX) 40 MG tablet 1 tab by mouth every day in the AM and then 1 as needed only for persistent swelling in the PM 60 tablet 11  . gabapentin (NEURONTIN) 300 MG capsule TAKE 1 CAPSULE(300 MG) BY MOUTH THREE TIMES DAILY 90 capsule 5  . glucose blood (ONE TOUCH ULTRA TEST) test strip Use to check blood sugars four times a day Dx E11.9 400 each 11  . hydrOXYzine (ATARAX/VISTARIL) 10 MG tablet Take 1 tablet (10 mg total) by mouth 3 (three) times daily as needed. 60 tablet 2  . insulin lispro (HUMALOG) 100 UNIT/ML injection INJECT UP TO 12 UNITS INTO THE SKIN THREE TIMES DAILY WITH MEALS WITH SLIDING SCALE AS DIRECTED 20 mL 11  . Insulin Syringe-Needle U-100 25G X 1" 1 ML MISC Use to administer humalog insulin three times a day Dx E11.9 100 each 5  . levalbuterol (XOPENEX HFA) 45 MCG/ACT inhaler Inhale 1-2 puffs into the lungs every 8 (eight) hours as needed for wheezing or shortness of breath. USE 2 PUFFS 3 TIMES DAILY X 5 DAYS THEN BACK TO HOME REGIMEN AS NEEDED. 1 Inhaler 0  . methotrexate (RHEUMATREX) 2.5 MG tablet Take 15 mg by mouth every Tuesday.     . Methotrexate, PF, 10 MG/0.4ML SOAJ Inject 10 mg into the skin once a week. 4 pen 1  . omeprazole (PRILOSEC) 40 MG capsule Take 1 capsule (40 mg total) by mouth daily. 90 capsule 3  . ONE TOUCH ULTRA TEST test strip TEST BLOOD SUGAR FOUR TIMES DAILY 400 each 0  . potassium chloride (K-DUR) 10 MEQ tablet 3 tab by mouth per day only when taking the lasix (Patient taking differently: Take 10-20 mEq by mouth 2 (two) times daily. Take 10 meq in the morning and 8mq at night) 90  tablet 11  . ranitidine (ZANTAC) 150 MG tablet TAKE 1 TABLET(150 MG) BY MOUTH AT BEDTIME 30 tablet 0  . sertraline (ZOLOFT) 100 MG tablet TAKE 2 TABLETS BY MOUTH DAILY 180 tablet 3  . SUMAtriptan (IMITREX) 100 MG tablet Take 1 tablet by mouth at the onset of a headache. May repeat in 2 hours if headache persists or recurs. 10 tablet 2  . telmisartan (MICARDIS) 40 MG tablet TAKE 1 TABLET(40 MG) BY MOUTH DAILY 90 tablet 0  . TURMERIC PO Take 1 tablet by mouth daily.    . vitamin B-12 (CYANOCOBALAMIN) 1000 MCG tablet Take 1,000 mcg by mouth daily.    . Vitamin D, Ergocalciferol, (DRISDOL) 50000 units CAPS capsule TAKE 1 CAPSULE BY MOUTH EVERY 7 DAYS 12 capsule 0  . zonisamide (ZONEGRAN) 100 MG capsule Take 1 capsule (100 mg total) by mouth daily. 90 capsule 1   No current facility-administered medications on file prior to visit.  ALLERGIES: Allergies  Allergen Reactions  . Azithromycin Hives    (z pak) hives  . Bee Venom Anaphylaxis  . Flagyl [Metronidazole] Hives and Shortness Of Breath  . Penicillins Shortness Of Breath and Swelling    Has patient had a PCN reaction causing immediate rash, facial/tongue/throat swelling, SOB or lightheadedness with hypotension: Yes Has patient had a PCN reaction causing severe rash involving mucus membranes or skin necrosis: No Has patient had a PCN reaction that required hospitalization No Has patient had a PCN reaction occurring within the last 10 years: No If all of the above answers are "NO", then may proceed with Cephalosporin use.  REACTION: swelling and difficulty breathing  . Shellfish Allergy Anaphylaxis  . Klonopin [Clonazepam] Other (See Comments)    Memory difficulty  . Tramadol     FAMILY HISTORY: Family History  Problem Relation Age of Onset  . Allergies Mother   . Heart attack Mother   . Diabetes Mother   . Diabetes Father   . Heart disease Father   . Rheum arthritis Father   . Stroke Father   . Hypertension Unknown   .  Ovarian cancer Maternal Aunt   . Lung cancer Maternal Aunt   . Breast cancer Maternal Aunt   . Bone cancer Maternal Aunt   . Stroke Maternal Uncle   . Other Neg Hx     SOCIAL HISTORY: Social History   Socioeconomic History  . Marital status: Single    Spouse name: Not on file  . Number of children: 0  . Years of education: Not on file  . Highest education level: Not on file  Social Needs  . Financial resource strain: Not on file  . Food insecurity - worry: Not on file  . Food insecurity - inability: Not on file  . Transportation needs - medical: Not on file  . Transportation needs - non-medical: Not on file  Occupational History  . Occupation: personal shopper  Tobacco Use  . Smoking status: Former Smoker    Packs/day: 1.00    Years: 20.00    Pack years: 20.00    Types: Cigarettes    Last attempt to quit: 10/25/2012    Years since quitting: 5.1  . Smokeless tobacco: Never Used  . Tobacco comment: QUIT 04/2010 AND STARTED BACK 2014 X 3 MONTHS. less than 1 ppd.  started at age 14.    Substance and Sexual Activity  . Alcohol use: No  . Drug use: No  . Sexual activity: Yes    Birth control/protection: None  Other Topics Concern  . Not on file  Social History Narrative   Pt lives with [name]- also pt of [facility]    REVIEW OF SYSTEMS: Constitutional: No fevers, chills, or sweats, no generalized fatigue, change in appetite Eyes: No visual changes, double vision, eye pain Ear, nose and throat: No hearing loss, ear pain, nasal congestion, sore throat Cardiovascular: No chest pain, palpitations Respiratory:  No shortness of breath at rest or with exertion, wheezes GastrointestinaI: No nausea, vomiting, diarrhea, abdominal pain, fecal incontinence Genitourinary:  No dysuria, urinary retention or frequency Musculoskeletal:  No neck pain, back pain Integumentary: No rash, pruritus, skin lesions Neurological: as above Psychiatric: No depression, insomnia, anxiety Endocrine:  No palpitations, fatigue, diaphoresis, mood swings, change in appetite, change in weight, increased thirst Hematologic/Lymphatic:  No purpura, petechiae. Allergic/Immunologic: no itchy/runny eyes, nasal congestion, recent allergic reactions, rashes  PHYSICAL EXAM: Vitals:   12/20/17 0728  BP: 110/80  Pulse: (!) 116  SpO2: 98%   General: No acute distress.  Patient appears well-groomed.  Morbidly obese body habitus. Head:  Normocephalic/atraumatic Eyes:  Fundi examined but not visualized Neck: supple, no paraspinal tenderness, full range of motion Heart:  Regular rate and rhythm Lungs:  Clear to auscultation bilaterally Back: No paraspinal tenderness Neurological Exam: alert and oriented to person, place, and time. Attention span and concentration intact, recent and remote memory intact, fund of knowledge intact.  Speech fluent and not dysarthric, language intact.  CN II-XII intact. Bulk and tone normal, muscle strength 5/5 throughout.  Sensation to light touch  intact.  Deep tendon reflexes 2+ throughout.  Finger to nose testing intact.  Gait normal, Romberg negative  IMPRESSION: Migraine without aura, non-intractable.  Stable Morbid obesity  PLAN: 1.  Continue zonisamide 162m daily 2.  Use sumatriptan as needed (Zofran for nausea) 3.  Limit use of pain relievers to no more than 2 days out of the week.  These medications include acetaminophen, ibuprofen, triptans and narcotics.  This will help reduce risk of rebound headaches. 4.  Be aware of common food triggers such as processed sweets, processed foods with nitrites (such as deli meat, hot dogs, sausages), foods with MSG, alcohol (such as wine), chocolate, certain cheeses, certain fruits (dried fruits, bananas, some citrus fruit), vinegar, diet soda. 4.  Avoid caffeine 5.  Routine exercise 6.  Proper sleep hygiene 7.  Stay adequately hydrated with water 8.  Keep a headache diary. 9.  Maintain proper stress management. 10.   Proper diet.  Weight loss.  Do not skip meals. 11.  Consider supplements:  Magnesium citrate 4057mto 60030maily, riboflavin 400m4moenzyme Q 10 100mg43mee times daily 12.  Follow up in 9 months.  Heinrich Fertig Metta Clines CC:  JamesCathlean Cower

## 2017-12-20 NOTE — Patient Instructions (Signed)
Migraine Recommendations: 1.  Continue zonisamide 100mg  daily 2.  Use sumatriptan as needed (Zofran for nausea) 3.  Limit use of pain relievers to no more than 2 days out of the week.  These medications include acetaminophen, ibuprofen, triptans and narcotics.  This will help reduce risk of rebound headaches. 4.  Be aware of common food triggers such as processed sweets, processed foods with nitrites (such as deli meat, hot dogs, sausages), foods with MSG, alcohol (such as wine), chocolate, certain cheeses, certain fruits (dried fruits, bananas, some citrus fruit), vinegar, diet soda. 4.  Avoid caffeine 5.  Routine exercise 6.  Proper sleep hygiene 7.  Stay adequately hydrated with water 8.  Keep a headache diary. 9.  Maintain proper stress management. 10.  Do not skip meals. 11.  Consider supplements:  Magnesium citrate 400mg  to 600mg  daily, riboflavin 400mg , Coenzyme Q 10 100mg  three times daily 12.  Follow up in 9 months.

## 2017-12-22 ENCOUNTER — Ambulatory Visit: Payer: Self-pay | Admitting: Internal Medicine

## 2018-01-11 ENCOUNTER — Ambulatory Visit: Payer: Self-pay | Admitting: Neurology

## 2018-01-16 LAB — HM DIABETES EYE EXAM

## 2018-02-04 ENCOUNTER — Other Ambulatory Visit: Payer: Self-pay | Admitting: Internal Medicine

## 2018-02-04 ENCOUNTER — Other Ambulatory Visit: Payer: Self-pay | Admitting: Emergency Medicine

## 2018-02-06 NOTE — Telephone Encounter (Signed)
12/28/2017 90# 

## 2018-02-06 NOTE — Telephone Encounter (Signed)
Done erx 

## 2018-02-17 ENCOUNTER — Encounter: Payer: Self-pay | Admitting: Neurology

## 2018-03-08 ENCOUNTER — Other Ambulatory Visit: Payer: Self-pay | Admitting: Neurology

## 2018-03-08 ENCOUNTER — Other Ambulatory Visit: Payer: Self-pay | Admitting: Internal Medicine

## 2018-03-10 DIAGNOSIS — D869 Sarcoidosis, unspecified: Secondary | ICD-10-CM | POA: Diagnosis not present

## 2018-03-10 DIAGNOSIS — G894 Chronic pain syndrome: Secondary | ICD-10-CM | POA: Diagnosis not present

## 2018-03-10 DIAGNOSIS — M069 Rheumatoid arthritis, unspecified: Secondary | ICD-10-CM | POA: Diagnosis not present

## 2018-03-10 DIAGNOSIS — M545 Low back pain: Secondary | ICD-10-CM | POA: Diagnosis not present

## 2018-03-10 DIAGNOSIS — Z79899 Other long term (current) drug therapy: Secondary | ICD-10-CM | POA: Diagnosis not present

## 2018-03-10 DIAGNOSIS — Z79891 Long term (current) use of opiate analgesic: Secondary | ICD-10-CM | POA: Diagnosis not present

## 2018-03-21 DIAGNOSIS — G894 Chronic pain syndrome: Secondary | ICD-10-CM | POA: Diagnosis not present

## 2018-03-21 DIAGNOSIS — G609 Hereditary and idiopathic neuropathy, unspecified: Secondary | ICD-10-CM | POA: Diagnosis not present

## 2018-03-21 DIAGNOSIS — F4542 Pain disorder with related psychological factors: Secondary | ICD-10-CM | POA: Diagnosis not present

## 2018-03-21 DIAGNOSIS — M792 Neuralgia and neuritis, unspecified: Secondary | ICD-10-CM | POA: Diagnosis not present

## 2018-03-28 DIAGNOSIS — M79609 Pain in unspecified limb: Secondary | ICD-10-CM | POA: Diagnosis not present

## 2018-03-28 DIAGNOSIS — M545 Low back pain: Secondary | ICD-10-CM | POA: Diagnosis not present

## 2018-04-04 ENCOUNTER — Other Ambulatory Visit: Payer: Self-pay | Admitting: Internal Medicine

## 2018-04-07 DIAGNOSIS — Z79899 Other long term (current) drug therapy: Secondary | ICD-10-CM | POA: Diagnosis not present

## 2018-04-07 DIAGNOSIS — M79606 Pain in leg, unspecified: Secondary | ICD-10-CM | POA: Diagnosis not present

## 2018-04-07 DIAGNOSIS — M069 Rheumatoid arthritis, unspecified: Secondary | ICD-10-CM | POA: Diagnosis not present

## 2018-04-07 DIAGNOSIS — Z79891 Long term (current) use of opiate analgesic: Secondary | ICD-10-CM | POA: Diagnosis not present

## 2018-04-07 DIAGNOSIS — G894 Chronic pain syndrome: Secondary | ICD-10-CM | POA: Diagnosis not present

## 2018-04-07 DIAGNOSIS — M545 Low back pain: Secondary | ICD-10-CM | POA: Diagnosis not present

## 2018-04-13 ENCOUNTER — Ambulatory Visit: Payer: BLUE CROSS/BLUE SHIELD | Admitting: Internal Medicine

## 2018-04-13 ENCOUNTER — Encounter: Payer: Self-pay | Admitting: Internal Medicine

## 2018-04-13 VITALS — BP 126/82 | HR 79 | Temp 99.0°F | Ht 67.0 in | Wt 321.0 lb

## 2018-04-13 DIAGNOSIS — I10 Essential (primary) hypertension: Secondary | ICD-10-CM | POA: Diagnosis not present

## 2018-04-13 DIAGNOSIS — E1140 Type 2 diabetes mellitus with diabetic neuropathy, unspecified: Secondary | ICD-10-CM

## 2018-04-13 DIAGNOSIS — M545 Low back pain: Secondary | ICD-10-CM | POA: Diagnosis not present

## 2018-04-13 DIAGNOSIS — Z794 Long term (current) use of insulin: Secondary | ICD-10-CM | POA: Diagnosis not present

## 2018-04-13 MED ORDER — KETOROLAC TROMETHAMINE 30 MG/ML IJ SOLN
30.0000 mg | Freq: Once | INTRAMUSCULAR | Status: AC
  Administered 2018-04-13: 30 mg via INTRAMUSCULAR

## 2018-04-13 MED ORDER — CYCLOBENZAPRINE HCL 5 MG PO TABS: 5.0000 mg | ORAL_TABLET | Freq: Three times a day (TID) | ORAL | 2 refills | Status: DC | PRN

## 2018-04-13 NOTE — Assessment & Plan Note (Signed)
stable overall by history and exam, recent data reviewed with pt, and pt to continue medical treatment as before,  to f/u any worsening symptoms or concerns Lab Results  Component Value Date   HGBA1C 7.1 (H) 11/08/2017

## 2018-04-13 NOTE — Assessment & Plan Note (Addendum)
Acute on chronic, for toradol 30 IM x 1, for added muscle relaxer prn, cont percocet prn, f/u ESI as planned next wk, will need off work 6/20 to 7/1

## 2018-04-13 NOTE — Progress Notes (Signed)
Subjective:    Patient ID: Nicole Melendez, female    DOB: December 04, 1975, 42 y.o.   MRN: [id number]  HPI  Here with 4-5 days onset acute on chronic low back pain, severe, constant, worse to trying to get OOB in each am and worse to sit or stand, sharp and dull, across the whole lower back but no bowel or bladder change, fever, wt loss,  worsening LE pain/numbness/weakness, or falls.  Due for what sounds like f/u ESI next Thursday per ortho with hx of lumbar DJD. Could not work today even in sitting position.  Needs off 6/20 - 7/1, will forward an other FMLA .  Seeing pain management on percocet and not asking for pain med, only muscle relaxer. Denies urinary symptoms such as dysuria, frequency, urgency, flank pain, hematuria or n/v, fever, chills.  Denies worsening reflux, abd pain, dysphagia, n/v, bowel change or blood.   Past Medical History:  Diagnosis Date  . Allergic rhinitis, cause unspecified   . Anemia   . Anxiety   . Depression   . Diabetes mellitus type II    steroid related, patient states "im no longer diabetic"  . Essential hypertension 08/08/2015  . GERD (gastroesophageal reflux disease)   . History of blood transfusion   . Hyperlipidemia   . Menorrhagia   . Migraine   . Morbid obesity ([place])   . Otitis media 06/28/2015  . Peptic ulcer disease   . Positive ANA (antinuclear antibody) 02/14/2012  . Sarcoid    including hand per rheumatology-Dr. Amil Amen  . Sarcoidosis of lung ([place])   . Shortness of breath    on exertion  . Varicose veins with pain    Past Surgical History:  Procedure Laterality Date  . ABDOMINAL HYSTERECTOMY    . COLONOSCOPY WITH PROPOFOL N/A 10/19/2016   Procedure: COLONOSCOPY WITH PROPOFOL;  Surgeon: Mauri Pole, MD;  Location: [facility];  Service: Endoscopy;  Laterality: N/A;  . ESOPHAGOGASTRODUODENOSCOPY (EGD) WITH PROPOFOL N/A 10/19/2016   Procedure: ESOPHAGOGASTRODUODENOSCOPY (EGD) WITH PROPOFOL;  Surgeon: Mauri Pole, MD;  Location:  [facility];  Service: Endoscopy;  Laterality: N/A;  . HERNIA MESH REMOVAL  02/2013  . uterine ablation  03/2010  . WISDOM TOOTH EXTRACTION      reports that she quit smoking about 5 years ago. Her smoking use included cigarettes. She has a 20.00 pack-year smoking history. She has never used smokeless tobacco. She reports that she does not drink alcohol or use drugs. family history includes Allergies in her mother; Bone cancer in her maternal aunt; Breast cancer in her maternal aunt; Diabetes in her father and mother; Heart attack in her mother; Heart disease in her father; Hypertension in her unknown relative; Lung cancer in her maternal aunt; Ovarian cancer in her maternal aunt; Rheum arthritis in her father; Stroke in her father and maternal uncle. Allergies  Allergen Reactions  . Azithromycin Hives    (z pak) hives  . Bee Venom Anaphylaxis  . Flagyl [Metronidazole] Hives and Shortness Of Breath  . Penicillins Shortness Of Breath and Swelling    Has patient had a PCN reaction causing immediate rash, facial/tongue/throat swelling, SOB or lightheadedness with hypotension: Yes Has patient had a PCN reaction causing severe rash involving mucus membranes or skin necrosis: No Has patient had a PCN reaction that required hospitalization No Has patient had a PCN reaction occurring within the last 10 years: No If all of the above answers are "NO", then may proceed with Cephalosporin use.  REACTION: swelling and difficulty breathing  . Shellfish Allergy Anaphylaxis  . Klonopin [Clonazepam] Other (See Comments)    Memory difficulty  . Tramadol    Current Outpatient Medications on File Prior to Visit  Medication Sig Dispense Refill  . ALPRAZolam (XANAX) 0.5 MG tablet TAKE 1 TABLET(0.5 MG) BY MOUTH THREE TIMES DAILY AS NEEDED 90 tablet 2  . azelastine (ASTELIN) 0.1 % nasal spray Place 2 sprays into both nostrils 2 (two) times daily. Use in each nostril as directed 30 mL 12  . Blood Glucose  Monitoring Suppl (ONE TOUCH ULTRA 2) W/DEVICE KIT Use as directed four times daily. Dx: E11.9 1 each 0  . budesonide-formoterol (SYMBICORT) 160-4.5 MCG/ACT inhaler Inhale 2 puffs into the lungs 2 (two) times daily. 1 Inhaler 5  . buPROPion (WELLBUTRIN XL) 150 MG 24 hr tablet Take 1 tablet (150 mg total) by mouth daily. 90 tablet 3  . cetirizine (ZYRTEC) 10 MG tablet TAKE 1 TABLET(10 MG) BY MOUTH DAILY 90 tablet 3  . Diclofenac Sodium (PENNSAID) 2 % SOLN Apply 1 pump twice daily as needed. 112 g 3  . fluticasone (FLONASE) 50 MCG/ACT nasal spray Place 2 sprays into both nostrils daily. 16 g 0  . folic acid (FOLVITE) 1 MG tablet TAKE 2 TABLETS BY MOUTH DAILY 180 tablet 0  . furosemide (LASIX) 40 MG tablet 1 tab by mouth every day in the AM and then 1 as needed only for persistent swelling in the PM 60 tablet 11  . gabapentin (NEURONTIN) 300 MG capsule TAKE 1 CAPSULE(300 MG) BY MOUTH THREE TIMES DAILY 90 capsule 5  . glucose blood (ONE TOUCH ULTRA TEST) test strip Use to check blood sugars four times a day Dx E11.9 400 each 11  . hydrOXYzine (ATARAX/VISTARIL) 10 MG tablet TAKE 1 TABLET(10 MG) BY MOUTH THREE TIMES DAILY AS NEEDED 60 tablet 0  . insulin lispro (HUMALOG) 100 UNIT/ML injection INJECT UP TO 12 UNITS INTO THE SKIN THREE TIMES DAILY WITH MEALS WITH SLIDING SCALE AS DIRECTED 20 mL 11  . Insulin Syringe-Needle U-100 25G X 1" 1 ML MISC Use to administer humalog insulin three times a day Dx E11.9 100 each 5  . levalbuterol (XOPENEX HFA) 45 MCG/ACT inhaler Inhale 1-2 puffs into the lungs every 8 (eight) hours as needed for wheezing or shortness of breath. USE 2 PUFFS 3 TIMES DAILY X 5 DAYS THEN BACK TO HOME REGIMEN AS NEEDED. 1 Inhaler 0  . methotrexate (RHEUMATREX) 2.5 MG tablet Take 15 mg by mouth every Tuesday.     . Methotrexate, PF, 10 MG/0.4ML SOAJ Inject 10 mg into the skin once a week. 4 pen 1  . omeprazole (PRILOSEC) 40 MG capsule Take 1 capsule (40 mg total) by mouth daily. 90 capsule 3    . ONE TOUCH ULTRA TEST test strip TEST BLOOD SUGAR FOUR TIMES DAILY 400 each 0  . potassium chloride (K-DUR) 10 MEQ tablet 3 tab by mouth per day only when taking the lasix (Patient taking differently: Take 10-20 mEq by mouth 2 (two) times daily. Take 10 meq in the morning and 2mq at night) 90 tablet 11  . ranitidine (ZANTAC) 150 MG tablet TAKE 1 TABLET(150 MG) BY MOUTH AT BEDTIME 30 tablet 0  . sertraline (ZOLOFT) 100 MG tablet TAKE 2 TABLETS BY MOUTH DAILY 180 tablet 3  . SUMAtriptan (IMITREX) 100 MG tablet Take 1 tablet by mouth at the onset of a headache. May repeat in 2 hours if headache persists or recurs. 10  tablet 2  . telmisartan (MICARDIS) 40 MG tablet TAKE 1 TABLET(40 MG) BY MOUTH DAILY 90 tablet 0  . TURMERIC PO Take 1 tablet by mouth daily.    . vitamin B-12 (CYANOCOBALAMIN) 1000 MCG tablet Take 1,000 mcg by mouth daily.    . Vitamin D, Ergocalciferol, (DRISDOL) 50000 units CAPS capsule TAKE 1 CAPSULE BY MOUTH EVERY 7 DAYS 12 capsule 0  . zonisamide (ZONEGRAN) 100 MG capsule TAKE 1 CAPSULE(100 MG) BY MOUTH DAILY 90 capsule 0   No current facility-administered medications on file prior to visit.    Review of Systems  Constitutional: Negative for other unusual diaphoresis or sweats HENT: Negative for ear discharge or swelling Eyes: Negative for other worsening visual disturbances Respiratory: Negative for stridor or other swelling  Gastrointestinal: Negative for worsening distension or other blood Genitourinary: Negative for retention or other urinary change Musculoskeletal: Negative for other MSK pain or swelling Skin: Negative for color change or other new lesions Neurological: Negative for worsening tremors and other numbness  Psychiatric/Behavioral: Negative for worsening agitation or other fatigue All other system neg per pt    Objective:   Physical Exam BP 126/82   Pulse 79   Temp 99 F (37.2 C) (Oral)   Ht '5\' 7"'  (1.702 m)   Wt (!) 321 lb (145.6 kg)   LMP  07/17/2012   SpO2 98%   BMI 50.28 kg/m  VS noted, non toxic Constitutional: Pt appears in NAD HENT: Head: NCAT.  Right Ear: External ear normal.  Left Ear: External ear normal.  Eyes: . Pupils are equal, round, and reactive to light. Conjunctivae and EOM are normal Nose: without d/c or deformity Neck: Neck supple. Gross normal ROM Cardiovascular: Normal rate and regular rhythm.   Pulmonary/Chest: Effort normal and breath sounds without rales or wheezing.  Abd:  Soft, NT, ND, + BS, no organomegaly Spine with diffuse lumbar midline and right paravertebral tender without swelling or rash Neurological: Pt is alert. At baseline orientation, motor grossly intact Skin: Skin is warm. No rashes, other new lesions, no LE edema Psychiatric: Pt behavior is normal without agitation  No other exam findings     Assessment & Plan:

## 2018-04-13 NOTE — Assessment & Plan Note (Signed)
stable overall by history and exam, recent data reviewed with pt, and pt to continue medical treatment as before,  to f/u any worsening symptoms or concerns BP Readings from Last 3 Encounters:  04/13/18 126/82  12/20/17 110/80  11/26/17 126/70

## 2018-04-13 NOTE — Patient Instructions (Addendum)
You had the pain shot today (toradol 30 mg)  Please take all new medication as prescribed - the muscle relaxer  Please continue all other medications as before, including the percocet you have from pain management  Please have the pharmacy call with any other refills you may need.  Please continue your efforts at being more active, low cholesterol diet, and weight control.  Please keep your appointments with your specialists as you may have planned - the injection next week  We can fill out the [facility] for being out of work from 6/20 - 7/1

## 2018-04-17 ENCOUNTER — Telehealth: Payer: Self-pay | Admitting: Internal Medicine

## 2018-04-17 NOTE — Telephone Encounter (Signed)
Disability forms have been received via fax for the dates of June 20 to July 1. Forms have been completed & placed in providers box to sign.

## 2018-04-18 DIAGNOSIS — Z0279 Encounter for issue of other medical certificate: Secondary | ICD-10-CM

## 2018-04-18 NOTE — Telephone Encounter (Signed)
Forms have been signed, faxed to [facility] @ [phone], copy sent to scan &charged for.   LVM to inform patient. & Original is ready to be picked up. Can mail if patient would like.

## 2018-04-20 DIAGNOSIS — M12819 Other specific arthropathies, not elsewhere classified, unspecified shoulder: Secondary | ICD-10-CM | POA: Diagnosis not present

## 2018-04-21 ENCOUNTER — Ambulatory Visit: Payer: BLUE CROSS/BLUE SHIELD | Admitting: Internal Medicine

## 2018-05-04 ENCOUNTER — Emergency Department (HOSPITAL_COMMUNITY)
Admission: EM | Admit: 2018-05-04 | Discharge: 2018-05-05 | Disposition: A | Discharge status: Home / Self Care | Payer: BLUE CROSS/BLUE SHIELD | Attending: Emergency Medicine | Admitting: Emergency Medicine

## 2018-05-04 ENCOUNTER — Encounter (HOSPITAL_COMMUNITY): Payer: Self-pay | Admitting: Emergency Medicine

## 2018-05-04 ENCOUNTER — Other Ambulatory Visit: Payer: Self-pay

## 2018-05-04 DIAGNOSIS — M7918 Myalgia, other site: Secondary | ICD-10-CM | POA: Insufficient documentation

## 2018-05-04 DIAGNOSIS — Z794 Long term (current) use of insulin: Secondary | ICD-10-CM | POA: Diagnosis not present

## 2018-05-04 DIAGNOSIS — Z87891 Personal history of nicotine dependence: Secondary | ICD-10-CM | POA: Diagnosis not present

## 2018-05-04 DIAGNOSIS — I5032 Chronic diastolic (congestive) heart failure: Secondary | ICD-10-CM | POA: Insufficient documentation

## 2018-05-04 DIAGNOSIS — M545 Low back pain: Secondary | ICD-10-CM | POA: Diagnosis not present

## 2018-05-04 DIAGNOSIS — E119 Type 2 diabetes mellitus without complications: Secondary | ICD-10-CM | POA: Insufficient documentation

## 2018-05-04 DIAGNOSIS — Z79899 Other long term (current) drug therapy: Secondary | ICD-10-CM | POA: Diagnosis not present

## 2018-05-04 DIAGNOSIS — I110 Hypertensive heart disease with heart failure: Secondary | ICD-10-CM | POA: Diagnosis not present

## 2018-05-04 DIAGNOSIS — M25511 Pain in right shoulder: Secondary | ICD-10-CM | POA: Diagnosis not present

## 2018-05-04 MED ORDER — OXYCODONE-ACETAMINOPHEN 5-325 MG PO TABS
1.0000 | ORAL_TABLET | Freq: Once | ORAL | Status: AC
  Administered 2018-05-04: 1 via ORAL
  Filled 2018-05-04: qty 1
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
## 2018-05-05 ENCOUNTER — Encounter: Payer: Self-pay | Admitting: Internal Medicine
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
## 2018-05-08 ENCOUNTER — Other Ambulatory Visit: Payer: Self-pay | Admitting: Internal Medicine

## 2018-05-08 ENCOUNTER — Ambulatory Visit: Payer: BLUE CROSS/BLUE SHIELD | Admitting: Internal Medicine

## 2018-05-08 ENCOUNTER — Encounter: Payer: Self-pay | Admitting: Internal Medicine

## 2018-05-08 VITALS — BP 128/86 | HR 88 | Temp 98.3°F | Ht 66.5 in | Wt 312.0 lb

## 2018-05-08 DIAGNOSIS — Z794 Long term (current) use of insulin: Secondary | ICD-10-CM

## 2018-05-08 DIAGNOSIS — M5416 Radiculopathy, lumbar region: Secondary | ICD-10-CM

## 2018-05-08 DIAGNOSIS — E1140 Type 2 diabetes mellitus with diabetic neuropathy, unspecified: Secondary | ICD-10-CM | POA: Diagnosis not present

## 2018-05-08 DIAGNOSIS — I10 Essential (primary) hypertension: Secondary | ICD-10-CM | POA: Diagnosis not present

## 2018-05-08 NOTE — Assessment & Plan Note (Signed)
stable overall by history and exam, recent data reviewed with pt, and pt to continue medical treatment as before,  to f/u any worsening symptoms or concerns Lab Results  Component Value Date   HGBA1C 7.1 (H) 11/08/2017

## 2018-05-08 NOTE — Telephone Encounter (Signed)
Done erx 
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
## 2018-05-08 NOTE — Assessment & Plan Note (Signed)
stable overall by history and exam, recent data reviewed with pt, and pt to continue medical treatment as before,  to f/u any worsening symptoms or concerns BP Readings from Last 3 Encounters:  05/08/18 128/86  05/05/18 116/67  04/13/18 126/82

## 2018-05-08 NOTE — Patient Instructions (Signed)
Please continue all other medications as before, including the muscle relaxer as needed  Please have the pharmacy call with any other refills you may need.  Please continue your efforts at being more active, low cholesterol diet, and weight control.  You are otherwise up to date with prevention measures today.  Please keep your appointments with your specialists as you may have planned  You will be contacted regarding the referral for: Neurosurgury, and MRI for the lower back

## 2018-05-08 NOTE — Assessment & Plan Note (Signed)
Recent onset, for MRI, pain control, surgical referral,  to f/u any worsening symptoms or concerns

## 2018-05-09 ENCOUNTER — Telehealth: Payer: Self-pay | Admitting: Internal Medicine

## 2018-05-09 NOTE — Telephone Encounter (Signed)
Letter done. Waiting for fax number.

## 2018-05-09 NOTE — Telephone Encounter (Signed)
Letter has been faxed.

## 2018-05-09 NOTE — Telephone Encounter (Signed)
Talked to [facility] for Dr. Jenny Reichmann will try to get this done as soon as she can but we need fax #. I advised pt and she will call us back with fax #.   Copied from [place] [id number]. Topic: Inquiry >> May 09, 2018  9:08 AM Margot Ables wrote: Reason for CRM: Pt needs a return to work note effective today 05/09/18. Pt is asking if it can be emailed to her job. Email: [email or web address] Please notify pt.

## 2018-05-09 NOTE — Telephone Encounter (Signed)
Pt called back in to provider the fax # for work note.   Fax: [phone]   ATTN: Alinda Sierras

## 2018-05-13 ENCOUNTER — Other Ambulatory Visit: Payer: Self-pay

## 2018-05-17 ENCOUNTER — Ambulatory Visit
Admission: RE | Admit: 2018-05-17 | Discharge: 2018-05-17 | Disposition: A | Discharge status: Home / Self Care | Payer: BLUE CROSS/BLUE SHIELD | Source: Ambulatory Visit | Attending: Internal Medicine | Admitting: Internal Medicine

## 2018-05-17 DIAGNOSIS — M5416 Radiculopathy, lumbar region: Secondary | ICD-10-CM

## 2018-05-17 DIAGNOSIS — M48061 Spinal stenosis, lumbar region without neurogenic claudication: Secondary | ICD-10-CM | POA: Diagnosis not present

## 2018-05-19 ENCOUNTER — Other Ambulatory Visit: Payer: Self-pay

## 2018-05-23 ENCOUNTER — Ambulatory Visit (INDEPENDENT_AMBULATORY_CARE_PROVIDER_SITE_OTHER): Payer: BLUE CROSS/BLUE SHIELD | Admitting: Internal Medicine

## 2018-05-23 VITALS — BP 130/84 | HR 94 | Temp 98.8°F | Ht 66.5 in | Wt 319.0 lb

## 2018-05-23 DIAGNOSIS — Z794 Long term (current) use of insulin: Secondary | ICD-10-CM

## 2018-05-23 DIAGNOSIS — I10 Essential (primary) hypertension: Secondary | ICD-10-CM

## 2018-05-23 DIAGNOSIS — M545 Low back pain, unspecified: Secondary | ICD-10-CM

## 2018-05-23 DIAGNOSIS — E1140 Type 2 diabetes mellitus with diabetic neuropathy, unspecified: Secondary | ICD-10-CM | POA: Diagnosis not present

## 2018-05-23 MED ORDER — PREDNISONE 10 MG PO TABS
ORAL_TABLET | ORAL | 0 refills | Status: DC
Start: 2018-05-23 — End: 2018-08-03

## 2018-05-23 MED ORDER — GABAPENTIN 300 MG PO CAPS
600.0000 mg | ORAL_CAPSULE | Freq: Three times a day (TID) | ORAL | 5 refills | Status: DC
Start: 2018-05-23 — End: 2019-03-05

## 2018-05-23 MED ORDER — OXYCODONE-ACETAMINOPHEN 10-325 MG PO TABS: 1.0000 | ORAL_TABLET | Freq: Four times a day (QID) | ORAL | 0 refills | Status: DC | PRN
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
## 2018-05-23 NOTE — Assessment & Plan Note (Addendum)
Acute on chronic, for increased gabapentin 600 tid, cont oxycodone prn as per pain management, predpac asd, f/u pain clinic aug 1; gave work note today for this wk only; we discussed the next FMLA will be for this wk, then afterwards ok for 2 days per month FMLA for the next year

## 2018-05-23 NOTE — Assessment & Plan Note (Signed)
stable overall by history and exam, recent data reviewed with pt, and pt to continue medical treatment as before,  to f/u any worsening symptoms or concerns BP Readings from Last 3 Encounters:  05/23/18 130/84  05/08/18 128/86  05/05/18 116/67

## 2018-05-23 NOTE — Assessment & Plan Note (Signed)
stable overall by history and exam, recent data reviewed with pt, and pt to continue medical treatment as before,  to f/u any worsening symptoms or concerns  

## 2018-05-23 NOTE — Patient Instructions (Signed)
Please take all new medication as prescribed - the prednisone  OK to increase the gabapentin to 600 mg three times per day  You are given the work note today  Please forward Disability and FMLA forms to [place]  Please continue all other medications as before, and refills have been done if requested.  Please have the pharmacy call with any other refills you may need.  Please continue your efforts at being more active, low cholesterol diet, and weight control.  Please keep your appointments with your specialists as you may have planned - pain medicine on Aug 1

## 2018-05-24 ENCOUNTER — Telehealth: Payer: Self-pay | Admitting: Internal Medicine

## 2018-05-24 ENCOUNTER — Telehealth: Payer: Self-pay
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
## 2018-05-24 NOTE — Telephone Encounter (Signed)
PA started on CoverMyMeds KEY: Key: [place]

## 2018-05-25 DIAGNOSIS — M47817 Spondylosis without myelopathy or radiculopathy, lumbosacral region: Secondary | ICD-10-CM | POA: Diagnosis not present

## 2018-05-25 DIAGNOSIS — D869 Sarcoidosis, unspecified: Secondary | ICD-10-CM | POA: Diagnosis not present

## 2018-05-25 DIAGNOSIS — Z0279 Encounter for issue of other medical certificate: Secondary | ICD-10-CM

## 2018-05-25 DIAGNOSIS — M79606 Pain in leg, unspecified: Secondary | ICD-10-CM | POA: Diagnosis not present

## 2018-05-25 DIAGNOSIS — M069 Rheumatoid arthritis, unspecified: Secondary | ICD-10-CM | POA: Diagnosis not present

## 2018-05-25 DIAGNOSIS — G894 Chronic pain syndrome: Secondary | ICD-10-CM | POA: Diagnosis not present

## 2018-05-25 NOTE — Telephone Encounter (Signed)
Forms have been signed, copy sent to scan & charged for.   Patient has been informed, as well as her part of the forms still needs to be completed.

## 2018-05-28 ENCOUNTER — Other Ambulatory Visit: Payer: Self-pay | Admitting: Internal Medicine

## 2018-05-30 NOTE — Telephone Encounter (Signed)
Christella Scheuermann forms have been completed & signed, faxed to [phone], Copy sent to scan.   Original up front up - Patient informed.

## 2018-06-05 DIAGNOSIS — M47817 Spondylosis without myelopathy or radiculopathy, lumbosacral region: Secondary | ICD-10-CM | POA: Diagnosis not present

## 2018-06-11 ENCOUNTER — Other Ambulatory Visit: Payer: Self-pay | Admitting: Internal Medicine

## 2018-06-23 DIAGNOSIS — Z79891 Long term (current) use of opiate analgesic: Secondary | ICD-10-CM | POA: Diagnosis not present

## 2018-06-23 DIAGNOSIS — M47817 Spondylosis without myelopathy or radiculopathy, lumbosacral region: Secondary | ICD-10-CM | POA: Diagnosis not present

## 2018-06-23 DIAGNOSIS — M069 Rheumatoid arthritis, unspecified: Secondary | ICD-10-CM | POA: Diagnosis not present

## 2018-06-23 DIAGNOSIS — Z79899 Other long term (current) drug therapy: Secondary | ICD-10-CM | POA: Diagnosis not present

## 2018-06-23 DIAGNOSIS — G894 Chronic pain syndrome: Secondary | ICD-10-CM | POA: Diagnosis not present

## 2018-06-25 ENCOUNTER — Other Ambulatory Visit: Payer: Self-pay | Admitting: Internal Medicine

## 2018-07-07 DIAGNOSIS — M5116 Intervertebral disc disorders with radiculopathy, lumbar region: Secondary | ICD-10-CM | POA: Diagnosis not present

## 2018-07-07 DIAGNOSIS — Z6841 Body Mass Index (BMI) 40.0 and over, adult: Secondary | ICD-10-CM | POA: Diagnosis not present

## 2018-07-07 DIAGNOSIS — Z1231 Encounter for screening mammogram for malignant neoplasm of breast: Secondary | ICD-10-CM | POA: Diagnosis not present

## 2018-07-07 DIAGNOSIS — M545 Low back pain: Secondary | ICD-10-CM | POA: Diagnosis not present

## 2018-07-07 DIAGNOSIS — Z01419 Encounter for gynecological examination (general) (routine) without abnormal findings: Secondary | ICD-10-CM | POA: Diagnosis not present

## 2018-07-07 DIAGNOSIS — M79604 Pain in right leg: Secondary | ICD-10-CM | POA: Diagnosis not present

## 2018-07-10 DIAGNOSIS — M47817 Spondylosis without myelopathy or radiculopathy, lumbosacral region: Secondary | ICD-10-CM | POA: Diagnosis not present

## 2018-07-13 ENCOUNTER — Other Ambulatory Visit: Payer: Self-pay | Admitting: Internal Medicine

## 2018-07-17 DIAGNOSIS — M5116 Intervertebral disc disorders with radiculopathy, lumbar region: Secondary | ICD-10-CM | POA: Diagnosis not present

## 2018-07-17 DIAGNOSIS — M79604 Pain in right leg: Secondary | ICD-10-CM | POA: Diagnosis not present

## 2018-07-17 DIAGNOSIS — M545 Low back pain: Secondary | ICD-10-CM | POA: Diagnosis not present

## 2018-07-18 ENCOUNTER — Ambulatory Visit: Payer: BLUE CROSS/BLUE SHIELD | Admitting: Internal Medicine

## 2018-07-18 ENCOUNTER — Encounter: Payer: Self-pay | Admitting: Internal Medicine

## 2018-07-18 VITALS — BP 120/90 | HR 94 | Temp 98.6°F | Ht 66.5 in | Wt 331.0 lb

## 2018-07-18 DIAGNOSIS — M545 Low back pain, unspecified: Secondary | ICD-10-CM

## 2018-07-18 DIAGNOSIS — E1140 Type 2 diabetes mellitus with diabetic neuropathy, unspecified: Secondary | ICD-10-CM

## 2018-07-18 DIAGNOSIS — Z23 Encounter for immunization: Secondary | ICD-10-CM | POA: Diagnosis not present

## 2018-07-18 DIAGNOSIS — I10 Essential (primary) hypertension: Secondary | ICD-10-CM

## 2018-07-18 DIAGNOSIS — G8929 Other chronic pain: Secondary | ICD-10-CM | POA: Diagnosis not present

## 2018-07-18 DIAGNOSIS — M549 Dorsalgia, unspecified: Secondary | ICD-10-CM

## 2018-07-18 DIAGNOSIS — Z794 Long term (current) use of insulin: Secondary | ICD-10-CM

## 2018-07-18 MED ORDER — POTASSIUM CHLORIDE ER 10 MEQ PO TBCR: EXTENDED_RELEASE_TABLET | ORAL | 11 refills | Status: DC

## 2018-07-18 MED ORDER — KETOROLAC TROMETHAMINE 30 MG/ML IJ SOLN
30.0000 mg | Freq: Once | INTRAMUSCULAR | Status: AC
  Administered 2018-07-18: 30 mg via INTRAMUSCULAR

## 2018-07-18 NOTE — Assessment & Plan Note (Signed)
stable overall by history and exam, recent data reviewed with pt, and pt to continue medical treatment as before,  to f/u any worsening symptoms or concerns  

## 2018-07-18 NOTE — Assessment & Plan Note (Signed)
With acute flare apparently related to water exercise yesterday, for toradol IM 30, FMLA to be redone

## 2018-07-18 NOTE — Patient Instructions (Signed)
You had the flu shot today, and the pain shot (toradol)  Please continue all other medications as before, including the muscle relaxer and oxycodone  Please talk to [place] about the [facility] for intermittent leave  Please have the pharmacy call with any other refills you may need.  Please continue your efforts at being more active, low cholesterol diet, and weight control.  You are otherwise up to date with prevention measures today.  Please keep your appointments with your specialists as you may have planned - pain medicine on Friday

## 2018-07-18 NOTE — Assessment & Plan Note (Signed)
For f/u as planned

## 2018-07-18 NOTE — Progress Notes (Signed)
Subjective:    Patient ID: Nicole Melendez, female    DOB: [birth year], 42 y.o.   MRN: [id number]  HPI  Here after attended water excercises yesterday and apparently exerted too much, as she has today c/o severe bilat shoulder, hip and lower back pain, that seems to involve radiation of pain to all extremities without numbness or weakness, no falls.  Has known severe lumbar DJD/DDD, has seen pain management with next appt  Friday sept 27.  Will need FMLA redone to account for intermittent leave as needed  Pt denies chest pain, increased sob or doe, wheezing, orthopnea, PND, increased LE swelling, palpitations, dizziness or syncope.  Pt denies new neurological symptoms such as new headache, or facial or extremity weakness or numbness, other than above.   Pt denies polydipsia, polyuria Past Medical History:  Diagnosis Date  . Allergic rhinitis, cause unspecified   . Anemia   . Anxiety   . Depression   . Diabetes mellitus type II    steroid related, patient states "im no longer diabetic"  . Essential hypertension 08/08/2015  . GERD (gastroesophageal reflux disease)   . History of blood transfusion   . Hyperlipidemia   . Menorrhagia   . Migraine   . Morbid obesity ([place])   . Otitis media 06/28/2015  . Peptic ulcer disease   . Positive ANA (antinuclear antibody) 02/14/2012  . Sarcoid    including hand per rheumatology-Dr. Amil Amen  . Sarcoidosis of lung ([place])   . Shortness of breath    on exertion  . Varicose veins with pain    Past Surgical History:  Procedure Laterality Date  . ABDOMINAL HYSTERECTOMY    . COLONOSCOPY WITH PROPOFOL N/A 10/19/2016   Procedure: COLONOSCOPY WITH PROPOFOL;  Surgeon: Mauri Pole, MD;  Location: [facility];  Service: Endoscopy;  Laterality: N/A;  . ESOPHAGOGASTRODUODENOSCOPY (EGD) WITH PROPOFOL N/A 10/19/2016   Procedure: ESOPHAGOGASTRODUODENOSCOPY (EGD) WITH PROPOFOL;  Surgeon: Mauri Pole, MD;  Location: [facility];  Service: Endoscopy;   Laterality: N/A;  . HERNIA MESH REMOVAL  02/2013  . uterine ablation  03/2010  . WISDOM TOOTH EXTRACTION      reports that she quit smoking about 5 years ago. Her smoking use included cigarettes. She has a 20.00 pack-year smoking history. She has never used smokeless tobacco. She reports that she does not drink alcohol or use drugs. family history includes Allergies in her mother; Bone cancer in her maternal aunt; Breast cancer in her maternal aunt; Diabetes in her father and mother; Heart attack in her mother; Heart disease in her father; Hypertension in her unknown relative; Lung cancer in her maternal aunt; Ovarian cancer in her maternal aunt; Rheum arthritis in her father; Stroke in her father and maternal uncle. Allergies  Allergen Reactions  . Azithromycin Hives    (z pak) hives  . Bee Venom Anaphylaxis  . Flagyl [Metronidazole] Hives and Shortness Of Breath  . Penicillins Shortness Of Breath and Swelling    Has patient had a PCN reaction causing immediate rash, facial/tongue/throat swelling, SOB or lightheadedness with hypotension: Yes Has patient had a PCN reaction causing severe rash involving mucus membranes or skin necrosis: No Has patient had a PCN reaction that required hospitalization No Has patient had a PCN reaction occurring within the last 10 years: No If all of the above answers are "NO", then may proceed with Cephalosporin use.  REACTION: swelling and difficulty breathing  . Shellfish Allergy Anaphylaxis  . Klonopin [Clonazepam] Other (See Comments)  Memory difficulty  . Tramadol    Current Outpatient Medications on File Prior to Visit  Medication Sig Dispense Refill  . ALPRAZolam (XANAX) 0.5 MG tablet TAKE 1 TABLET(0.5 MG) BY MOUTH THREE TIMES DAILY AS NEEDED 90 tablet 2  . azelastine (ASTELIN) 0.1 % nasal spray Place 2 sprays into both nostrils 2 (two) times daily. Use in each nostril as directed 30 mL 12  . Blood Glucose Monitoring Suppl (ONE TOUCH ULTRA 2)  W/DEVICE KIT Use as directed four times daily. Dx: E11.9 1 each 0  . budesonide-formoterol (SYMBICORT) 160-4.5 MCG/ACT inhaler Inhale 2 puffs into the lungs 2 (two) times daily. 1 Inhaler 5  . buPROPion (WELLBUTRIN XL) 150 MG 24 hr tablet Take 1 tablet (150 mg total) by mouth daily. 90 tablet 3  . cetirizine (ZYRTEC) 10 MG tablet TAKE 1 TABLET(10 MG) BY MOUTH DAILY 90 tablet 3  . cyclobenzaprine (FLEXERIL) 5 MG tablet Take 1 tablet (5 mg total) by mouth 3 (three) times daily as needed for muscle spasms. 30 tablet 2  . Diclofenac Sodium (PENNSAID) 2 % SOLN Apply 1 pump twice daily as needed. 112 g 3  . fluticasone (FLONASE) 50 MCG/ACT nasal spray Place 2 sprays into both nostrils daily. 16 g 0  . folic acid (FOLVITE) 1 MG tablet TAKE 2 TABLETS BY MOUTH DAILY 180 tablet 0  . furosemide (LASIX) 40 MG tablet 1 tab by mouth every day in the AM and then 1 as needed only for persistent swelling in the PM 60 tablet 11  . gabapentin (NEURONTIN) 300 MG capsule Take 2 capsules (600 mg total) by mouth 3 (three) times daily. 180 capsule 5  . glucose blood (ONE TOUCH ULTRA TEST) test strip Use to check blood sugars four times a day Dx E11.9 400 each 11  . hydrOXYzine (ATARAX/VISTARIL) 10 MG tablet TAKE 1 TABLET(10 MG) BY MOUTH THREE TIMES DAILY AS NEEDED 60 tablet 0  . insulin lispro (HUMALOG) 100 UNIT/ML injection INJECT UP TO 12 UNITS INTO THE SKIN THREE TIMES DAILY WITH MEALS WITH SLIDING SCALE AS DIRECTED 20 mL 11  . Insulin Syringe-Needle U-100 25G X 1" 1 ML MISC Use to administer humalog insulin three times a day Dx E11.9 100 each 5  . levalbuterol (XOPENEX HFA) 45 MCG/ACT inhaler Inhale 1-2 puffs into the lungs every 8 (eight) hours as needed for wheezing or shortness of breath. USE 2 PUFFS 3 TIMES DAILY X 5 DAYS THEN BACK TO HOME REGIMEN AS NEEDED. 1 Inhaler 0  . methotrexate (RHEUMATREX) 2.5 MG tablet Take 15 mg by mouth every Tuesday.     . Methotrexate, PF, 10 MG/0.4ML SOAJ Inject 10 mg into the skin  once a week. 4 pen 1  . omeprazole (PRILOSEC) 40 MG capsule TAKE 1 CAPSULE(40 MG) BY MOUTH DAILY 90 capsule 0  . ONE TOUCH ULTRA TEST test strip TEST BLOOD SUGAR FOUR TIMES DAILY 400 each 0  . oxyCODONE-acetaminophen (PERCOCET) 10-325 MG tablet Take 1 tablet by mouth every 6 (six) hours as needed for pain. 20 tablet 0  . predniSONE (DELTASONE) 10 MG tablet 3 tabs by mouth per day for 3 days,2tabs per day for 3 days,1tab per day for 3 days 18 tablet 0  . ranitidine (ZANTAC) 150 MG tablet TAKE 1 TABLET(150 MG) BY MOUTH AT BEDTIME 30 tablet 0  . sertraline (ZOLOFT) 100 MG tablet TAKE 2 TABLETS BY MOUTH DAILY 180 tablet 0  . SUMAtriptan (IMITREX) 100 MG tablet Take 1 tablet by mouth at the  onset of a headache. May repeat in 2 hours if headache persists or recurs. 10 tablet 2  . telmisartan (MICARDIS) 40 MG tablet TAKE 1 TABLET(40 MG) BY MOUTH DAILY 90 tablet 0  . TURMERIC PO Take 1 tablet by mouth daily.    . vitamin B-12 (CYANOCOBALAMIN) 1000 MCG tablet Take 1,000 mcg by mouth daily.    . Vitamin D, Ergocalciferol, (DRISDOL) 50000 units CAPS capsule TAKE 1 CAPSULE BY MOUTH EVERY 7 DAYS 12 capsule 0  . zonisamide (ZONEGRAN) 100 MG capsule TAKE 1 CAPSULE(100 MG) BY MOUTH DAILY 90 capsule 0   No current facility-administered medications on file prior to visit.    Review of Systems  Constitutional: Negative for other unusual diaphoresis or sweats HENT: Negative for ear discharge or swelling Eyes: Negative for other worsening visual disturbances Respiratory: Negative for stridor or other swelling  Gastrointestinal: Negative for worsening distension or other blood Genitourinary: Negative for retention or other urinary change Musculoskeletal: Negative for other MSK pain or swelling Skin: Negative for color change or other new lesions Neurological: Negative for worsening tremors and other numbness  Psychiatric/Behavioral: Negative for worsening agitation or other fatigue All other system neg per pt     Objective:   Physical Exam BP 120/90 (BP Location: Left Arm, Patient Position: Sitting, Cuff Size: Large)   Pulse 94   Temp 98.6 F (37 C) (Oral)   Ht 5' 6.5" (1.689 m)   Wt (!) 331 lb (150.1 kg)   LMP 07/17/2012   SpO2 95%   BMI 52.62 kg/m  VS noted,  Constitutional: Pt appears in NAD HENT: Head: NCAT.  Right Ear: External ear normal.  Left Ear: External ear normal.  Eyes: . Pupils are equal, round, and reactive to light. Conjunctivae and EOM are normal Nose: without d/c or deformity Neck: Neck supple. Gross normal ROM Cardiovascular: Normal rate and regular rhythm.   Pulmonary/Chest: Effort normal and breath sounds without rales or wheezing.  Abd:  Soft, NT, ND, + BS, no organomegaly Neurological: Pt is alert. At baseline orientation, motor grossly intact Skin: Skin is warm. No rashes, other new lesions, no LE edema Psychiatric: Pt behavior is normal without agitation  No other exam findings  MRI LS Spine 05-17-18 - summary only IMPRESSION: 1. Moderate to severe multilevel facet arthropathy throughout the lumbar spine, moderate at L2-3 and L3-4, and severe at L4-5 and L5-S1. Associated 4 mm anterolisthesis of L5 on S1. 2. Mild to moderate left lateral recess narrowing at L4-5 and L5-S1, primarily due to facet disease. 3. Mild to moderate bilateral L5 foraminal narrowing due to disc bulge and facet disease. 4. Mild edema within the interspinous ligaments at L2-3 through L4-5, similar but less pronounced as compared to previous exam. Given the persistence of this finding, this is suspected to likely be degenerative in nature. Lab Results  Component Value Date   WBC 6.8 11/08/2017   HGB 12.7 11/08/2017   HCT 38.8 11/08/2017   PLT 303.0 11/08/2017   GLUCOSE 139 (H) 11/08/2017   CHOL 177 11/08/2017   TRIG 78.0 11/08/2017   HDL 45.40 11/08/2017   LDLDIRECT 135.0 07/05/2011   LDLCALC 116 (H) 11/08/2017   ALT 10 11/08/2017   AST 12 11/08/2017   NA 140 11/08/2017    K 3.7 11/08/2017   CL 105 11/08/2017   CREATININE 0.73 11/08/2017   BUN 6 11/08/2017   CO2 28 11/08/2017   TSH 1.59 06/15/2017   INR 1.3 11/17/2012   HGBA1C 7.1 (H) 11/08/2017  MICROALBUR 0.7 06/15/2017       Assessment & Plan:

## 2018-07-21 DIAGNOSIS — M47817 Spondylosis without myelopathy or radiculopathy, lumbosacral region: Secondary | ICD-10-CM | POA: Diagnosis not present

## 2018-07-21 DIAGNOSIS — G894 Chronic pain syndrome: Secondary | ICD-10-CM | POA: Diagnosis not present

## 2018-07-21 DIAGNOSIS — M069 Rheumatoid arthritis, unspecified: Secondary | ICD-10-CM | POA: Diagnosis not present

## 2018-07-21 DIAGNOSIS — Z79891 Long term (current) use of opiate analgesic: Secondary | ICD-10-CM | POA: Diagnosis not present

## 2018-07-21 DIAGNOSIS — Z79899 Other long term (current) drug therapy: Secondary | ICD-10-CM | POA: Diagnosis not present

## 2018-07-31 ENCOUNTER — Encounter: Payer: BLUE CROSS/BLUE SHIELD | Admitting: Internal Medicine

## 2018-08-03 ENCOUNTER — Encounter: Payer: Self-pay | Admitting: Internal Medicine

## 2018-08-03 ENCOUNTER — Ambulatory Visit: Payer: BLUE CROSS/BLUE SHIELD | Admitting: Internal Medicine

## 2018-08-03 VITALS — BP 136/82 | HR 90 | Temp 98.0°F | Resp 18 | Ht 66.5 in | Wt 328.0 lb

## 2018-08-03 DIAGNOSIS — H579 Unspecified disorder of eye and adnexa: Secondary | ICD-10-CM | POA: Diagnosis not present

## 2018-08-03 DIAGNOSIS — M545 Low back pain, unspecified: Secondary | ICD-10-CM

## 2018-08-03 MED ORDER — KETOROLAC TROMETHAMINE 30 MG/ML IJ SOLN
30.0000 mg | Freq: Once | INTRAMUSCULAR | Status: AC
  Administered 2018-08-03: 30 mg via INTRAMUSCULAR

## 2018-08-03 MED ORDER — PREDNISONE 10 MG PO TABS: ORAL_TABLET | ORAL | 0 refills | Status: DC

## 2018-08-03 NOTE — Assessment & Plan Note (Signed)
Following with pain management - taking percocet for pain Has had flare of pain - requesting something to help and note for work - has FMLA toradol 30 mg IM x 1 Quick prednisone taper

## 2018-08-03 NOTE — Assessment & Plan Note (Signed)
Right eye has a feeling of something in it for a few days after wearing her contacts over night ? Cornea scratch Exam normal Advised to see eye doctor

## 2018-08-03 NOTE — Progress Notes (Signed)
Subjective:    Patient ID: Nicole Melendez, female    DOB: [birth year], 42 y.o.   MRN: [id number]  HPI The patient is here for an acute visit for lower back pain.  She has known severe lumbar DJD/DDD and sees pain management.  She is taking oxycodone-acetaminophen that is prescribed from the pain clinic.    4 days ago her back pain was more than usual.  She took her usual pain medications and did some stretching.  Monday, 3 days ago, she had increased pain while sitting. She had to go home from work.  She took more pain medication. She did more stretching.  She has had increased pain since then.  She has PT tomorrow morning.    She has pain across her lower back, right side more than left.  She denies pain in the legs.  She denies numbness/tingling.  She states some weakness in her legs.     Her right eye was swollen shut on Sunday morning when she woke.  She did have her contacts in and knows she should not have left them in.  She flushed the eye and it still feels like there is something in it. No change in vision or eye pain.  The eye was red initially but it has resolved.   Medications and allergies reviewed with patient and updated if appropriate.  Patient Active Problem List   Diagnosis Date Noted  . Right lumbar radiculopathy 05/08/2018  . Chronic pain 10/11/2017  . URI (upper respiratory infection) 09/25/2017  . Sarcoidosis of lung ([place])   . Acute on chronic diastolic CHF (congestive heart failure) ([place]) 09/22/2017  . Rheumatoid arthritis ([place]) 09/18/2017  . CAP (community acquired pneumonia) 09/18/2017  . Chronic diastolic CHF (congestive heart failure) ([place]) 09/18/2017  . Pancreatitis 06/26/2017  . 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 Right ankle sprain 06/26/2017  . Oral thrush 06/22/2017  . Possible exposure to STD 06/15/2017  . Diarrhea 04/10/2017  . Bilateral hand swelling 04/07/2017  . Abdominal pain 04/07/2017  . Abnormality of gait 01/26/2017  . Avulsion  fracture of right ankle 12/01/2016  . Ulcer of esophagus with bleeding   . Gastritis and gastroduodenitis   . Blepharitis of right upper eyelid 10/09/2016  . Peroneal tendinitis of lower leg, right 10/06/2016  . Right ankle pain 09/16/2016  . Dizziness 03/11/2016  . Mixed headache 03/09/2016  . Chronic venous insufficiency 02/24/2016  . Gastroenteritis 12/30/2015  . Headache 12/30/2015  . Epistaxis 12/09/2015  . 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 Cervical muscle strain 12/03/2015  . Right-sided face pain 11/26/2015  . Multiple contusions 11/26/2015  . Neck pain, bilateral posterior 11/26/2015  . Low back pain 11/26/2015  . Spinal stenosis of lumbar region 10/28/2015  . Chronic sciatica of left side 10/28/2015  . DOE (dyspnea on exertion) 09/02/2015  . Acute bronchitis 08/22/2015  . Essential hypertension 08/08/2015  . Diaphoresis 08/07/2015  . Cough 07/02/2015  . Vaginitis and vulvovaginitis 06/29/2015  . Otitis media 06/28/2015  . Thrush 06/24/2015  . Nausea & vomiting 06/10/2015  . Acute upper respiratory infection 11/14/2014  . Wheezing 11/14/2014  . Angioedema 03/19/2014  . Vaginitis 11/19/2012  . Smoker 11/19/2012  . Positive ANA (antinuclear antibody) 02/14/2012  . Polyarthritis 02/11/2012  . Migraine 04/05/2011  . Preventative health care 02/21/2011  . Allergic rhinitis 02/17/2011  . URTICARIA 09/29/2010  . Depression 09/14/2010  . Diabetes mellitus with neuropathy ([place]) 07/31/2010  . PERIPHERAL EDEMA 07/31/2010  . INSOMNIA-SLEEP DISORDER-UNSPEC  06/19/2010  . 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 MENORRHAGIA 05/15/2010  . Hyperlipidemia 12/17/2009  . Iron deficiency anemia 12/17/2009  . Anxiety state 12/17/2009  . Morbid obesity due to excess calories ([place]) 07/02/2007  . Gastroesophageal reflux disease 07/02/2007  . PEPTIC ULCER DISEASE 07/02/2007    Current Outpatient Medications on File Prior to Visit  Medication Sig Dispense Refill  . ALPRAZolam  (XANAX) 0.5 MG tablet TAKE 1 TABLET(0.5 MG) BY MOUTH THREE TIMES DAILY AS NEEDED 90 tablet 2  . azelastine (ASTELIN) 0.1 % nasal spray Place 2 sprays into both nostrils 2 (two) times daily. Use in each nostril as directed 30 mL 12  . Blood Glucose Monitoring Suppl (ONE TOUCH ULTRA 2) W/DEVICE KIT Use as directed four times daily. Dx: E11.9 1 each 0  . budesonide-formoterol (SYMBICORT) 160-4.5 MCG/ACT inhaler Inhale 2 puffs into the lungs 2 (two) times daily. 1 Inhaler 5  . buPROPion (WELLBUTRIN XL) 150 MG 24 hr tablet Take 1 tablet (150 mg total) by mouth daily. 90 tablet 3  . cetirizine (ZYRTEC) 10 MG tablet TAKE 1 TABLET(10 MG) BY MOUTH DAILY 90 tablet 3  . cyclobenzaprine (FLEXERIL) 5 MG tablet Take 1 tablet (5 mg total) by mouth 3 (three) times daily as needed for muscle spasms. 30 tablet 2  . Diclofenac Sodium (PENNSAID) 2 % SOLN Apply 1 pump twice daily as needed. 112 g 3  . fluticasone (FLONASE) 50 MCG/ACT nasal spray Place 2 sprays into both nostrils daily. 16 g 0  . folic acid (FOLVITE) 1 MG tablet TAKE 2 TABLETS BY MOUTH DAILY 180 tablet 0  . furosemide (LASIX) 40 MG tablet 1 tab by mouth every day in the AM and then 1 as needed only for persistent swelling in the PM 60 tablet 11  . gabapentin (NEURONTIN) 300 MG capsule Take 2 capsules (600 mg total) by mouth 3 (three) times daily. 180 capsule 5  . glucose blood (ONE TOUCH ULTRA TEST) test strip Use to check blood sugars four times a day Dx E11.9 400 each 11  . hydrOXYzine (ATARAX/VISTARIL) 10 MG tablet TAKE 1 TABLET(10 MG) BY MOUTH THREE TIMES DAILY AS NEEDED 60 tablet 0  . insulin lispro (HUMALOG) 100 UNIT/ML injection INJECT UP TO 12 UNITS INTO THE SKIN THREE TIMES DAILY WITH MEALS WITH SLIDING SCALE AS DIRECTED 20 mL 11  . Insulin Syringe-Needle U-100 25G X 1" 1 ML MISC Use to administer humalog insulin three times a day Dx E11.9 100 each 5  . levalbuterol (XOPENEX HFA) 45 MCG/ACT inhaler Inhale 1-2 puffs into the lungs every 8 (eight)  hours as needed for wheezing or shortness of breath. USE 2 PUFFS 3 TIMES DAILY X 5 DAYS THEN BACK TO HOME REGIMEN AS NEEDED. 1 Inhaler 0  . methotrexate (RHEUMATREX) 2.5 MG tablet Take 15 mg by mouth every Tuesday.     . Methotrexate, PF, 10 MG/0.4ML SOAJ Inject 10 mg into the skin once a week. 4 pen 1  . omeprazole (PRILOSEC) 40 MG capsule TAKE 1 CAPSULE(40 MG) BY MOUTH DAILY 90 capsule 0  . ONE TOUCH ULTRA TEST test strip TEST BLOOD SUGAR FOUR TIMES DAILY 400 each 0  . potassium chloride (K-DUR) 10 MEQ tablet 3 tab by mouth per day only when taking the lasix 90 tablet 11  . ranitidine (ZANTAC) 150 MG tablet TAKE 1 TABLET(150 MG) BY MOUTH AT BEDTIME 30 tablet 0  . sertraline (ZOLOFT) 100 MG tablet TAKE 2 TABLETS BY MOUTH DAILY 180 tablet 0  .  SUMAtriptan (IMITREX) 100 MG tablet Take 1 tablet by mouth at the onset of a headache. May repeat in 2 hours if headache persists or recurs. 10 tablet 2  . telmisartan (MICARDIS) 40 MG tablet TAKE 1 TABLET(40 MG) BY MOUTH DAILY 90 tablet 0  . TURMERIC PO Take 1 tablet by mouth daily.    . vitamin B-12 (CYANOCOBALAMIN) 1000 MCG tablet Take 1,000 mcg by mouth daily.    . Vitamin D, Ergocalciferol, (DRISDOL) 50000 units CAPS capsule TAKE 1 CAPSULE BY MOUTH EVERY 7 DAYS 12 capsule 0  . zonisamide (ZONEGRAN) 100 MG capsule TAKE 1 CAPSULE(100 MG) BY MOUTH DAILY 90 capsule 0   No current facility-administered medications on file prior to visit.     Past Medical History:  Diagnosis Date  . Allergic rhinitis, cause unspecified   . Anemia   . Anxiety   . Depression   . Diabetes mellitus type II    steroid related, patient states "im no longer diabetic"  . Essential hypertension 08/08/2015  . GERD (gastroesophageal reflux disease)   . History of blood transfusion   . Hyperlipidemia   . Menorrhagia   . Migraine   . Morbid obesity ([place])   . Otitis media 06/28/2015  . Peptic ulcer disease   . Positive ANA (antinuclear antibody) 02/14/2012  . Sarcoid     including hand per rheumatology-Dr. Amil Amen  . Sarcoidosis of lung ([place])   . Shortness of breath    on exertion  . Varicose veins with pain     Past Surgical History:  Procedure Laterality Date  . ABDOMINAL HYSTERECTOMY    . COLONOSCOPY WITH PROPOFOL N/A 10/19/2016   Procedure: COLONOSCOPY WITH PROPOFOL;  Surgeon: Mauri Pole, MD;  [facility];  Service: Endoscopy;  Laterality: N/A;  . ESOPHAGOGASTRODUODENOSCOPY (EGD) WITH PROPOFOL N/A 10/19/2016   Procedure: ESOPHAGOGASTRODUODENOSCOPY (EGD) WITH PROPOFOL;  Surgeon: Mauri Pole, MD;  [facility];  Service: Endoscopy;  Laterality: N/A;  . HERNIA MESH REMOVAL  02/2013  . uterine ablation  03/2010  . WISDOM TOOTH EXTRACTION      Social History   Socioeconomic History  . Marital status: Single    Spouse name: Not on file  . Number of children: 0  . Years of education: Not on file  . Highest education level: Not on file  Occupational History  . Occupation: Therapist, art  Social Needs  . Financial resource strain: Not on file  . Food insecurity:    Worry: Not on file    Inability: Not on file  . Transportation needs:    Medical: Not on file    Non-medical: Not on file  Tobacco Use  . Smoking status: Former Smoker    Packs/day: 1.00    Years: 20.00    Pack years: 20.00    Types: Cigarettes    Last attempt to quit: 10/25/2012    Years since quitting: 5.7  . Smokeless tobacco: Never Used  . Tobacco comment: QUIT 04/2010 AND STARTED BACK 2014 X 3 MONTHS. less than 1 ppd.  started at age 58.    Substance and Sexual Activity  . Alcohol use: No  . Drug use: No  . Sexual activity: Yes    Birth control/protection: None  Lifestyle  . Physical activity:    Days per week: Not on file    Minutes per session: Not on file  . Stress: Not on file  Relationships  . Social connections:    Talks on phone: Not on  file    Gets together: Not on file    Attends religious service: Not on file     Active member of club or organization: Not on file    Attends meetings of clubs or organizations: Not on file    Relationship status: Not on file  Other Topics Concern  . Not on file  Social History Narrative   Pt lives with [name]- also pt of [facility]    Family History  Problem Relation Age of Onset  . Allergies Mother   . Heart attack Mother   . Diabetes Mother   . Diabetes Father   . Heart disease Father   . Rheum arthritis Father   . Stroke Father   . Hypertension Unknown   . Ovarian cancer Maternal Aunt   . Lung cancer Maternal Aunt   . Breast cancer Maternal Aunt   . Bone cancer Maternal Aunt   . Stroke Maternal Uncle   . Other Neg Hx     Review of Systems  Constitutional: Negative for chills and fever.  Eyes: Negative for photophobia, pain, discharge, redness and visual disturbance.  Musculoskeletal: Positive for back pain.  Neurological: Positive for weakness. Negative for numbness.       Objective:   Vitals:   08/03/18 1027  BP: 136/82  Pulse: 90  Resp: 18  Temp: 98 F (36.7 C)  SpO2: 97%   BP Readings from Last 3 Encounters:  08/03/18 136/82  07/18/18 120/90  05/23/18 130/84   Wt Readings from Last 3 Encounters:  08/03/18 (!) 328 lb (148.8 kg)  07/18/18 (!) 331 lb (150.1 kg)  05/23/18 (!) 319 lb (144.7 kg)   Body mass index is 52.15 kg/m.   Physical Exam  Constitutional: She appears well-developed and well-nourished. No distress.  HENT:  Head: Normocephalic and atraumatic.  Eyes: Pupils are equal, round, and reactive to light. Conjunctivae and EOM are normal. Right eye exhibits no discharge. Left eye exhibits no discharge. No scleral icterus.  Musculoskeletal: She exhibits tenderness (mnimal in lower back). She exhibits no edema.  Neurological: No sensory deficit.  Normal strength b/l LE  Skin: Skin is warm and dry. No rash noted. She is not diaphoretic.          Assessment & Plan:    See Problem List for Assessment and Plan of  chronic medical problems.

## 2018-08-03 NOTE — Patient Instructions (Addendum)
You received a Toradol injection today   A prescription of prednisone was sent to you pharmacy.     See your eye doctor regarding your right eye.     A note was given for work.

## 2018-08-07 ENCOUNTER — Telehealth: Payer: Self-pay

## 2018-08-07 NOTE — Telephone Encounter (Signed)
No, not that I am aware of.

## 2018-08-07 NOTE — Telephone Encounter (Signed)
Dr John please advise.  

## 2018-08-07 NOTE — Telephone Encounter (Signed)
I dont understand really what she wants; does she want new order, or does she want the referral documentation from July 2019 order faxed to [facility]?

## 2018-08-07 NOTE — Telephone Encounter (Signed)
Was the apart of the paperwork that you recently did for patient?  Copied from [place] [id number]. Topic: General - Inquiry >> Aug 04, 2018 12:54 PM Scherrie Gerlach wrote: Reason for CRM: pt states she needs the ordered MRI she had back in July faxed to [facility]. Order should have the codes they need. Pt states you should be aware of this order Fax: [phone]

## 2018-08-08 NOTE — Telephone Encounter (Signed)
Called pt to clarify, however, I received a busy signal.

## 2018-08-09 ENCOUNTER — Other Ambulatory Visit: Payer: Self-pay | Admitting: Internal Medicine

## 2018-08-09 NOTE — Telephone Encounter (Signed)
Done erx 

## 2018-08-11 ENCOUNTER — Telehealth: Payer: Self-pay | Admitting: Internal Medicine

## 2018-08-11 NOTE — Telephone Encounter (Signed)
Patient dropped off a return to work/intermintter leave form from [place].   Form has been completed with a return to work date on 08/07/18 & placed in providers box to sign.

## 2018-08-14 DIAGNOSIS — L723 Sebaceous cyst: Secondary | ICD-10-CM | POA: Diagnosis not present

## 2018-08-14 NOTE — Telephone Encounter (Signed)
Forms have been signed, Faxed to [facility] @ [phone], Copy sent to scan.   Patient informed & will pick up original.

## 2018-08-16 NOTE — Telephone Encounter (Signed)
Forms have been faxed to [facility].

## 2018-08-16 NOTE — Telephone Encounter (Signed)
Noted! Thank you

## 2018-08-16 NOTE — Telephone Encounter (Signed)
The fax number this needs to go too is [phone].  I told them it would come from out fax of [phone]

## 2018-08-17 NOTE — Telephone Encounter (Signed)
I received more forms from [facility] for this same thing. Forms have been completed & placed in providers box to sign once he returns to the office on Nov 4th.

## 2018-08-18 DIAGNOSIS — Z79899 Other long term (current) drug therapy: Secondary | ICD-10-CM | POA: Diagnosis not present

## 2018-08-18 DIAGNOSIS — M069 Rheumatoid arthritis, unspecified: Secondary | ICD-10-CM | POA: Diagnosis not present

## 2018-08-18 DIAGNOSIS — G894 Chronic pain syndrome: Secondary | ICD-10-CM | POA: Diagnosis not present

## 2018-08-18 DIAGNOSIS — Z79891 Long term (current) use of opiate analgesic: Secondary | ICD-10-CM | POA: Diagnosis not present

## 2018-08-18 DIAGNOSIS — M47817 Spondylosis without myelopathy or radiculopathy, lumbosacral region: Secondary | ICD-10-CM | POA: Diagnosis not present

## 2018-08-25 ENCOUNTER — Encounter: Payer: Self-pay | Admitting: Family

## 2018-08-25 ENCOUNTER — Ambulatory Visit: Payer: BLUE CROSS/BLUE SHIELD | Admitting: Family

## 2018-08-25 VITALS — BP 140/90 | HR 90 | Temp 98.5°F | Ht 66.5 in | Wt 334.1 lb

## 2018-08-25 DIAGNOSIS — G43009 Migraine without aura, not intractable, without status migrainosus: Secondary | ICD-10-CM | POA: Diagnosis not present

## 2018-08-25 DIAGNOSIS — F411 Generalized anxiety disorder: Secondary | ICD-10-CM

## 2018-08-25 DIAGNOSIS — M545 Low back pain: Secondary | ICD-10-CM | POA: Diagnosis not present

## 2018-08-25 DIAGNOSIS — G8929 Other chronic pain: Secondary | ICD-10-CM | POA: Diagnosis not present

## 2018-08-25 MED ORDER — KETOROLAC TROMETHAMINE 30 MG/ML IJ SOLN
30.0000 mg | Freq: Once | INTRAMUSCULAR | Status: AC
  Administered 2018-08-25: 30 mg via INTRAMUSCULAR

## 2018-08-25 NOTE — Addendum Note (Signed)
Addended by: Marcina Millard on: 08/25/2018 10:19 AM   Modules accepted: Orders
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
## 2018-08-27 ENCOUNTER — Other Ambulatory Visit: Payer: Self-pay | Admitting: Internal Medicine

## 2018-08-29 NOTE — Telephone Encounter (Signed)
Forms have been signed, faxed to [facility], Copy sent to scan.   Patient informed and original is ready to be picked up.

## 2018-08-30 ENCOUNTER — Ambulatory Visit: Payer: BLUE CROSS/BLUE SHIELD | Admitting: Internal Medicine

## 2018-08-31 ENCOUNTER — Ambulatory Visit: Payer: BLUE CROSS/BLUE SHIELD | Admitting: Internal Medicine

## 2018-08-31 ENCOUNTER — Other Ambulatory Visit (INDEPENDENT_AMBULATORY_CARE_PROVIDER_SITE_OTHER): Payer: BLUE CROSS/BLUE SHIELD

## 2018-08-31 ENCOUNTER — Encounter: Payer: Self-pay | Admitting: Internal Medicine

## 2018-08-31 VITALS — BP 122/78 | HR 100 | Temp 97.9°F | Ht 66.5 in | Wt 334.0 lb

## 2018-08-31 DIAGNOSIS — E1140 Type 2 diabetes mellitus with diabetic neuropathy, unspecified: Secondary | ICD-10-CM | POA: Diagnosis not present

## 2018-08-31 DIAGNOSIS — G8929 Other chronic pain: Secondary | ICD-10-CM | POA: Diagnosis not present

## 2018-08-31 DIAGNOSIS — I10 Essential (primary) hypertension: Secondary | ICD-10-CM

## 2018-08-31 DIAGNOSIS — Z794 Long term (current) use of insulin: Secondary | ICD-10-CM

## 2018-08-31 LAB — LIPID PANEL
Cholesterol: 152 mg/dL (ref 0–200)
HDL: 52.8 mg/dL (ref >39.00)
LDL CALC: 82 mg/dL (ref 0–99)
NonHDL: 99.16
TRIGLYCERIDES: 87 mg/dL (ref 0.0–149.0)
Total CHOL/HDL Ratio: 3
VLDL: 17.4 mg/dL (ref 0.0–40.0)

## 2018-08-31 LAB — BASIC METABOLIC PANEL
BUN: 8 mg/dL (ref 6–23)
CHLORIDE: 105 meq/L (ref 96–112)
CO2: 25 meq/L (ref 19–32)
CREATININE: 0.75 mg/dL (ref 0.40–1.20)
Calcium: 9.4 mg/dL (ref 8.4–10.5)
GFR: 107.64 mL/min (ref >60.00)
Glucose, Bld: 130 mg/dL — ABNORMAL HIGH (ref 70–99)
POTASSIUM: 4.1 meq/L (ref 3.5–5.1)
Sodium: 138 mEq/L (ref 135–145)

## 2018-08-31 LAB — HEPATIC FUNCTION PANEL
ALT: 19 U/L (ref 0–35)
AST: 20 U/L (ref 0–37)
Albumin: 4.2 g/dL (ref 3.5–5.2)
Alkaline Phosphatase: 176 U/L — ABNORMAL HIGH (ref 39–117)
BILIRUBIN DIRECT: 0.1 mg/dL (ref 0.0–0.3)
TOTAL PROTEIN: 7.6 g/dL (ref 6.0–8.3)
Total Bilirubin: 0.4 mg/dL (ref 0.2–1.2)

## 2018-08-31 LAB — HEMOGLOBIN A1C: Hgb A1c MFr Bld: 6.3 % (ref 4.6–6.5)

## 2018-08-31 MED ORDER — DICLOFENAC SODIUM 2 % TD SOLN: TRANSDERMAL | 3 refills | Status: DC

## 2018-08-31 MED ORDER — LIDOCAINE 5 % EX PTCH: 1.0000 | MEDICATED_PATCH | CUTANEOUS | 2 refills | Status: DC

## 2018-08-31 NOTE — Patient Instructions (Signed)
Ok to try the Pennsaid topical, and lidoderm patch if needed for pain  Please continue all other medications as before, and refills have been done if requested.  Please have the pharmacy call with any other refills you may need.  Please keep your appointments with your specialists as you may have planned - Physical Therapy and then Nerve Ablation
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
## 2018-09-01 DIAGNOSIS — M5116 Intervertebral disc disorders with radiculopathy, lumbar region: Secondary | ICD-10-CM | POA: Diagnosis not present

## 2018-09-01 DIAGNOSIS — M79604 Pain in right leg: Secondary | ICD-10-CM | POA: Diagnosis not present

## 2018-09-01 DIAGNOSIS — M545 Low back pain: Secondary | ICD-10-CM | POA: Diagnosis not present

## 2018-09-02 NOTE — Assessment & Plan Note (Signed)
stable overall by history and exam, recent data reviewed with pt, and pt to continue medical treatment as before,  to f/u any worsening symptoms or concerns  

## 2018-09-02 NOTE — Assessment & Plan Note (Signed)
Pt now seeing pain management, is not seeking narcotics but simply finds she cannot continue to work.  [place] for topical pennsaid and lidoderm additions as are non narcotic, cont f/u pain managenent and surgury

## 2018-09-06 ENCOUNTER — Ambulatory Visit: Payer: BLUE CROSS/BLUE SHIELD | Admitting: Internal Medicine

## 2018-09-08 DIAGNOSIS — M79604 Pain in right leg: Secondary | ICD-10-CM | POA: Diagnosis not present

## 2018-09-08 DIAGNOSIS — M5116 Intervertebral disc disorders with radiculopathy, lumbar region: Secondary | ICD-10-CM | POA: Diagnosis not present

## 2018-09-08 DIAGNOSIS — M545 Low back pain: Secondary | ICD-10-CM | POA: Diagnosis not present

## 2018-09-11 ENCOUNTER — Telehealth: Payer: Self-pay | Admitting: Internal Medicine

## 2018-09-11 NOTE — Telephone Encounter (Signed)
Does she have a scheduled f/u appt with surgury?  thanks

## 2018-09-11 NOTE — Telephone Encounter (Signed)
Patient came by office and dropped of [place] forms to be completed for a continuous leave out of work starting 08/21/18. She states she saw Mickel Baas on 11/1 & she informed her to see Dr.John about the leave. Saw Dr.John on 11/7. She states she is unsure of her return to work date. She thinks that Dr. Jenny Reichmann did not want her to return to work until she saw her specialist, & that she maybe having surgery?  Please advise if okay to complete forms, & if we do have a return to work date. Thank you.

## 2018-09-12 ENCOUNTER — Other Ambulatory Visit: Payer: Self-pay | Admitting: *Deleted

## 2018-09-12 ENCOUNTER — Ambulatory Visit: Payer: BLUE CROSS/BLUE SHIELD | Admitting: Internal Medicine

## 2018-09-12 MED ORDER — ZONISAMIDE 100 MG PO CAPS: ORAL_CAPSULE | ORAL | 1 refills | Status: DC

## 2018-09-12 NOTE — Telephone Encounter (Signed)
Spoke with patient, She states she talked with surgeon yesterday. They informed her that : She will need to do PT for 3 months before the surgery. She is going to do PT 3 x a week while she is out of work. Start date of leave 10/28 Ending 09/24/2018. She will return to work on 12/1 and slow down on the PT. Once completed her surgery will be outpatient and she will not take off any work for that or anymore for that matter after 12/1 she states.    Please advise if okay to complete. Thank you.

## 2018-09-12 NOTE — Telephone Encounter (Signed)
Forms has been completed & placed in providers box to review and sign.

## 2018-09-12 NOTE — Telephone Encounter (Signed)
I think sounds ok, ok for the FMLA with those dates

## 2018-09-13 NOTE — Telephone Encounter (Signed)
Signed, Faxed, Copy sent to scan.   Patient informed and will pick up original.

## 2018-09-15 ENCOUNTER — Other Ambulatory Visit: Payer: Self-pay | Admitting: Internal Medicine

## 2018-09-15 DIAGNOSIS — M47817 Spondylosis without myelopathy or radiculopathy, lumbosacral region: Secondary | ICD-10-CM | POA: Diagnosis not present

## 2018-09-15 DIAGNOSIS — M79604 Pain in right leg: Secondary | ICD-10-CM | POA: Diagnosis not present

## 2018-09-15 DIAGNOSIS — M5116 Intervertebral disc disorders with radiculopathy, lumbar region: Secondary | ICD-10-CM | POA: Diagnosis not present

## 2018-09-15 DIAGNOSIS — G894 Chronic pain syndrome: Secondary | ICD-10-CM | POA: Diagnosis not present

## 2018-09-15 DIAGNOSIS — D869 Sarcoidosis, unspecified: Secondary | ICD-10-CM | POA: Diagnosis not present

## 2018-09-15 DIAGNOSIS — M069 Rheumatoid arthritis, unspecified: Secondary | ICD-10-CM | POA: Diagnosis not present

## 2018-09-15 DIAGNOSIS — M545 Low back pain: Secondary | ICD-10-CM | POA: Diagnosis not present

## 2018-09-15 MED ORDER — SERTRALINE HCL 100 MG PO TABS: 200.0000 mg | ORAL_TABLET | Freq: Every day | ORAL | 1 refills | Status: DC

## 2018-09-15 NOTE — Telephone Encounter (Signed)
Requested Prescriptions  Pending Prescriptions Disp Refills  . sertraline (ZOLOFT) 100 MG tablet 180 tablet 1    Sig: Take 2 tablets (200 mg total) by mouth daily.     Psychiatry:  Antidepressants - SSRI Failed - 09/15/2018 11:03 AM      Failed - Completed PHQ-2 or PHQ-9 in the last 360 days.      Passed - Valid encounter within last 6 months    Recent Outpatient Visits          2 weeks ago Other chronic pain   [place] [facility], James W, MD   3 weeks ago Chronic bilateral low back pain without sciatica   [place], Marvis Repress, FNP   1 month ago Bilateral low back pain, unspecified chronicity, unspecified whether sciatica present   [employer], MD   1 month ago Midline low back pain without sciatica, unspecified chronicity   [facility], James W, MD   3 months ago Midline low back pain without sciatica, unspecified chronicity   [facility] -Georges Mouse, MD           Pt is due for PHQ-2 or PHQ-9

## 2018-09-15 NOTE — Telephone Encounter (Signed)
Copied from [place] [id number]. Topic: Quick Communication - Rx Refill/Question >> Sep 15, 2018  8:25 AM Rayann Heman wrote: Medication: sertraline (ZOLOFT) 100 MG tablet [[id number]]  Has the patient contacted their pharmacy?no Preferred Pharmacy (with phone number or street name): [id number] - [place], [place] [id number] (Phone) [phone] (Fax)  Agent: Please be advised that RX refills may take up to 3 business days. We ask that you follow-up with your pharmacy.

## 2018-09-18 ENCOUNTER — Other Ambulatory Visit: Payer: Self-pay | Admitting: Acute Care

## 2018-09-18 NOTE — Progress Notes (Signed)
NEUROLOGY FOLLOW UP OFFICE NOTE  Nicole Melendez [id number]  HISTORY OF PRESENT ILLNESS: Nicole Melendez is a 42 year old right-handed woman with diabetes with neuropathy, GERD, peptic ulcer disease, migraine, sarcoid, hyperlipidemia and hypertension who follows up for migraine.  UPDATE: Intensity:  7/10 Duration:  Usually 1 hour with sumatriptan but last one 3 weeks ago lasted 5 days (thinks it was stress-related Frequency: 4 migraines over past 9 months Frequency of abortive medication: only 4 times over last 9 months. Current NSAIDS: None (has PUD) Current analgesics: Tylenol Current triptans: Sumatriptan 100 mg Current ergotamine: None Current anti-emetic: Zofran 4 mg Current muscle relaxants: None Current anti-anxiolytic: Alprazolam, hydroxyzine Current sleep aide: None Current Antihypertensive medications: Lasix Current Antidepressant medications: Sertraline Current Anticonvulsant medications: Zonisamide 100 mg, gabapentin 300 mg 3 times daily Current anti-CGRP: None Current Vitamins/Herbal/Supplements: Folic acid, D3 Current Antihistamines/Decongestants: Zyrtec Other therapy: None Hormone/birth control: None  Caffeine: No Alcohol: No Smoker: No Diet: Hydrates, baked chicken, steamed vegetables Exercise: Yes Depression: Yes; Anxiety: Yes.  Both controlled. Other pain: back pain Sleep hygiene: Good  HISTORY: Onset: Mid 30s Location:  Back of head, right sided Quality:  pounding Initial Intensity:  8-9/10 Aura:  no Prodrome:  no Associated symptoms: Nausea, vomiting, osmophobia, right eye blurred vision, sometimes photophobia.  There is no associated unilateral numbness or weakness. Initial Duration:  2 days (within one hour with sumatriptan) Initial Frequency:  Varies.  Some months 1-2 days, some months 10 days Triggers: Unknown Relieving factors:  no Activity:  Cannot function 1 to 2 days per month  Past NSAIDS:  Ibuprofen, naproxen (discontinued due to  stomach ulcers) Past analgesics:  Percocet, tramadol (for back pain) Past abortive triptans:  no Past muscle relaxants:  tizanidine 17m Past anti-emetic:  Phenergan Past antihypertensive medications:  no Past antidepressant medications:  Wellbutrin Past anticonvulsant medications:  topiramate (caused nausea and vomiting) Past vitamins/Herbal/Supplements:  no Other past therapies:  meditation Family history of headache:  mother  PAST MEDICAL HISTORY: Past Medical History:  Diagnosis Date  . Allergic rhinitis, cause unspecified   . Anemia   . Anxiety   . Depression   . Diabetes mellitus type II    steroid related, patient states "im no longer diabetic"  . Essential hypertension 08/08/2015  . GERD (gastroesophageal reflux disease)   . History of blood transfusion   . Hyperlipidemia   . Menorrhagia   . Migraine   . Morbid obesity ([facility]   . Otitis media 06/28/2015  . Peptic ulcer disease   . Positive ANA (antinuclear antibody) 02/14/2012  . Sarcoid    including hand per rheumatology-Dr. BAmil Amen . Sarcoidosis of lung ([facility]   . Shortness of breath    on exertion  . Varicose veins with pain     MEDICATIONS: Current Outpatient Medications on File Prior to Visit  Medication Sig Dispense Refill  . ALPRAZolam (XANAX) 0.5 MG tablet TAKE 1 TABLET(0.5 MG) BY MOUTH THREE TIMES DAILY AS NEEDED 90 tablet 2  . azelastine (ASTELIN) 0.1 % nasal spray Place 2 sprays into both nostrils 2 (two) times daily. Use in each nostril as directed 30 mL 12  . Blood Glucose Monitoring Suppl (ONE TOUCH ULTRA 2) W/DEVICE KIT Use as directed four times daily. Dx: E11.9 1 each 0  . budesonide-formoterol (SYMBICORT) 160-4.5 MCG/ACT inhaler Inhale 2 puffs into the lungs 2 (two) times daily. 1 Inhaler 5  . buPROPion (WELLBUTRIN XL) 150 MG 24 hr tablet Take 1 tablet (150 mg total) by mouth daily. 9[place]  tablet 3  . cetirizine (ZYRTEC) 10 MG tablet TAKE 1 TABLET(10 MG) BY MOUTH DAILY 90 tablet 0  . cyclobenzaprine  (FLEXERIL) 5 MG tablet Take 1 tablet (5 mg total) by mouth 3 (three) times daily as needed for muscle spasms. 30 tablet 2  . Diclofenac Sodium (PENNSAID) 2 % SOLN Apply 1 pump twice daily as needed. 112 g 3  . fluticasone (FLONASE) 50 MCG/ACT nasal spray Place 2 sprays into both nostrils daily. 16 g 0  . folic acid (FOLVITE) 1 MG tablet TAKE 2 TABLETS BY MOUTH DAILY 180 tablet 0  . furosemide (LASIX) 40 MG tablet 1 tab by mouth every day in the AM and then 1 as needed only for persistent swelling in the PM 60 tablet 11  . gabapentin (NEURONTIN) 300 MG capsule Take 2 capsules (600 mg total) by mouth 3 (three) times daily. 180 capsule 5  . glucose blood (ONE TOUCH ULTRA TEST) test strip Use to check blood sugars four times a day Dx E11.9 400 each 11  . hydrOXYzine (ATARAX/VISTARIL) 10 MG tablet TAKE 1 TABLET(10 MG) BY MOUTH THREE TIMES DAILY AS NEEDED 60 tablet 0  . insulin lispro (HUMALOG) 100 UNIT/ML injection INJECT UP TO 12 UNITS INTO THE SKIN THREE TIMES DAILY WITH MEALS WITH SLIDING SCALE AS DIRECTED 20 mL 11  . Insulin Syringe-Needle U-100 25G X 1" 1 ML MISC Use to administer humalog insulin three times a day Dx E11.9 100 each 5  . levalbuterol (XOPENEX HFA) 45 MCG/ACT inhaler Inhale 1-2 puffs into the lungs every 8 (eight) hours as needed for wheezing or shortness of breath. USE 2 PUFFS 3 TIMES DAILY X 5 DAYS THEN BACK TO HOME REGIMEN AS NEEDED. 1 Inhaler 0  . lidocaine (LIDODERM) 5 % Place 1 patch onto the skin daily. Remove & Discard patch within 12 hours or as directed by MD 60 patch 2  . methotrexate (RHEUMATREX) 2.5 MG tablet Take 15 mg by mouth every Tuesday.     . Methotrexate, PF, 10 MG/0.4ML SOAJ Inject 10 mg into the skin once a week. 4 pen 1  . omeprazole (PRILOSEC) 40 MG capsule TAKE 1 CAPSULE(40 MG) BY MOUTH DAILY 90 capsule 0  . ONE TOUCH ULTRA TEST test strip TEST BLOOD SUGAR FOUR TIMES DAILY 100 each 0  . oxyCODONE-acetaminophen (PERCOCET) 10-325 MG tablet TK 1 T PO UP TO TID IF  NEEDED  0  . potassium chloride (K-DUR) 10 MEQ tablet 3 tab by mouth per day only when taking the lasix 90 tablet 11  . potassium chloride (K-DUR,KLOR-CON) 10 MEQ tablet TK 3 TS PO D ONLY WHEN TAKING THE LASIX  11  . predniSONE (DELTASONE) 10 MG tablet 3 tabs by mouth per day for 3 days,2tabs per day for 3 days,1tab per day for 3 days 18 tablet 0  . ranitidine (ZANTAC) 150 MG tablet TAKE 1 TABLET(150 MG) BY MOUTH AT BEDTIME 30 tablet 0  . rosuvastatin (CRESTOR) 40 MG tablet   3  . sertraline (ZOLOFT) 100 MG tablet Take 2 tablets (200 mg total) by mouth daily. 180 tablet 1  . SUMAtriptan (IMITREX) 100 MG tablet Take 1 tablet by mouth at the onset of a headache. May repeat in 2 hours if headache persists or recurs. 10 tablet 2  . telmisartan (MICARDIS) 40 MG tablet TAKE 1 TABLET(40 MG) BY MOUTH DAILY 90 tablet 0  . TURMERIC PO Take 1 tablet by mouth daily.    . vitamin B-12 (CYANOCOBALAMIN) 1000 MCG tablet  Take 1,000 mcg by mouth daily.    . Vitamin D, Ergocalciferol, (DRISDOL) 50000 units CAPS capsule TAKE 1 CAPSULE BY MOUTH EVERY 7 DAYS 12 capsule 0  . zonisamide (ZONEGRAN) 100 MG capsule TAKE 1 CAPSULE(100 MG) BY MOUTH DAILY 90 capsule 1   No current facility-administered medications on file prior to visit.     ALLERGIES: Allergies  Allergen Reactions  . Azithromycin Hives    (z pak) hives  . Bee Venom Anaphylaxis  . Flagyl [Metronidazole] Hives and Shortness Of Breath  . Penicillins Shortness Of Breath and Swelling    Has patient had a PCN reaction causing immediate rash, facial/tongue/throat swelling, SOB or lightheadedness with hypotension: Yes Has patient had a PCN reaction causing severe rash involving mucus membranes or skin necrosis: No Has patient had a PCN reaction that required hospitalization No Has patient had a PCN reaction occurring within the last 10 years: No If all of the above answers are "NO", then may proceed with Cephalosporin use.  REACTION: swelling and  difficulty breathing  . Shellfish Allergy Anaphylaxis  . Klonopin [Clonazepam] Other (See Comments)    Memory difficulty  . Tramadol     FAMILY HISTORY: Family History  Problem Relation Age of Onset  . Allergies Mother   . Heart attack Mother   . Diabetes Mother   . Diabetes Father   . Heart disease Father   . Rheum arthritis Father   . Stroke Father   . Hypertension Unknown   . Ovarian cancer Maternal Aunt   . Lung cancer Maternal Aunt   . Breast cancer Maternal Aunt   . Bone cancer Maternal Aunt   . Stroke Maternal Uncle   . Other Neg Hx     SOCIAL HISTORY: Social History   Socioeconomic History  . Marital status: Single    Spouse name: Not on file  . Number of children: 0  . Years of education: Not on file  . Highest education level: Not on file  Occupational History  . Occupation: Therapist, art  Social Needs  . Financial resource strain: Not on file  . Food insecurity:    Worry: Not on file    Inability: Not on file  . Transportation needs:    Medical: Not on file    Non-medical: Not on file  Tobacco Use  . Smoking status: Former Smoker    Packs/day: 1.00    Years: 20.00    Pack years: 20.00    Types: Cigarettes    Last attempt to quit: 10/25/2012    Years since quitting: 5.9  . Smokeless tobacco: Never Used  . Tobacco comment: QUIT 04/2010 AND STARTED BACK 2014 X 3 MONTHS. less than 1 ppd.  started at age 53.    Substance and Sexual Activity  . Alcohol use: No  . Drug use: No  . Sexual activity: Yes    Birth control/protection: None  Lifestyle  . Physical activity:    Days per week: Not on file    Minutes per session: Not on file  . Stress: Not on file  Relationships  . Social connections:    Talks on phone: Not on file    Gets together: Not on file    Attends religious service: Not on file    Active member of club or organization: Not on file    Attends meetings of clubs or organizations: Not on file    Relationship status: Not on file  .  Intimate partner violence:  Fear of current or ex partner: Not on file    Emotionally abused: Not on file    Physically abused: Not on file    Forced sexual activity: Not on file  Other Topics Concern  . Not on file  Social History Narrative   Pt lives with [name]- also pt of [facility]    REVIEW OF SYSTEMS: Constitutional: No fevers, chills, or sweats, no generalized fatigue, change in appetite Eyes: No visual changes, double vision, eye pain Ear, nose and throat: No hearing loss, ear pain, nasal congestion, sore throat Cardiovascular: No chest pain, palpitations Respiratory:  No shortness of breath at rest or with exertion, wheezes GastrointestinaI: No nausea, vomiting, diarrhea, abdominal pain, fecal incontinence Genitourinary:  No dysuria, urinary retention or frequency Musculoskeletal:  No neck pain, back pain Integumentary: No rash, pruritus, skin lesions Neurological: as above Psychiatric: No depression, insomnia, anxiety Endocrine: No palpitations, fatigue, diaphoresis, mood swings, change in appetite, change in weight, increased thirst Hematologic/Lymphatic:  No purpura, petechiae. Allergic/Immunologic: no itchy/runny eyes, nasal congestion, recent allergic reactions, rashes  PHYSICAL EXAM: Blood pressure 118/86, pulse 87, height 5' 6.5" (1.689 m), weight (!) 337 lb 6 oz (153 kg), last menstrual period 07/17/2012, SpO2 97 %. General: No acute distress.  Patient appears well-groomed.   Head:  Normocephalic/atraumatic Eyes:  Fundi examined but not visualized Neck: supple, no paraspinal tenderness, full range of motion Heart:  Regular rate and rhythm Lungs:  Clear to auscultation bilaterally Back: paraspinal tenderness Neurological Exam: alert and oriented to person, place, and time. Attention span and concentration intact, recent and remote memory intact, fund of knowledge intact.  Speech fluent and not dysarthric, language intact.  CN II-XII intact. Bulk and tone normal,  muscle strength 5/5 throughout.  Sensation to light touch intact.  Deep tendon reflexes 2+ throughout, toes downgoing.  Finger to nose and heel to shin testing intact.  Gait normal, Romberg negative.  IMPRESSION: Migraine without aura, without status migrainosus, not intractable Morbid obesity (BMI 53.64)  PLAN: 1.  For preventative management, zonisamide 100 mg daily 2.  For abortive therapy, sumatriptan 100 mg and Zofran for nausea. 3.  Limit use of pain relievers to no more than 2 days out of week to prevent risk of rebound or medication-overuse headache. 4.  Keep headache diary 5.  Exercise, diet/weight loss, hydration, caffeine cessation, sleep hygiene, monitor for and avoid triggers 6.  Consider:  magnesium citrate 424m daily, riboflavin 4062mdaily, and coenzyme Q10 10081mhree times daily 7.  Follow up in one year  AdaMetta ClinesO  CC: JamCathlean CowerD

## 2018-09-19 ENCOUNTER — Encounter: Payer: Self-pay | Admitting: Neurology

## 2018-09-19 ENCOUNTER — Ambulatory Visit: Payer: BLUE CROSS/BLUE SHIELD | Admitting: Neurology

## 2018-09-19 VITALS — BP 118/86 | HR 87 | Ht 66.5 in | Wt 337.4 lb

## 2018-09-19 DIAGNOSIS — G43009 Migraine without aura, not intractable, without status migrainosus: Secondary | ICD-10-CM | POA: Diagnosis not present

## 2018-09-19 NOTE — Patient Instructions (Addendum)
1.  Continue zonisamide 100mg  daily 2.  Continue sumatriptan 100mg  as needed/directed 3.  Limit use of pain relievers to no more than 2 days out of week to prevent risk of rebound or medication-overuse headache. 4.  Keep headache diary 5.  Follow up in one year.

## 2018-10-04 ENCOUNTER — Other Ambulatory Visit: Payer: Self-pay | Admitting: Internal Medicine

## 2018-10-13 DIAGNOSIS — D869 Sarcoidosis, unspecified: Secondary | ICD-10-CM | POA: Diagnosis not present

## 2018-10-13 DIAGNOSIS — M069 Rheumatoid arthritis, unspecified: Secondary | ICD-10-CM | POA: Diagnosis not present

## 2018-10-13 DIAGNOSIS — Z79899 Other long term (current) drug therapy: Secondary | ICD-10-CM | POA: Diagnosis not present

## 2018-10-13 DIAGNOSIS — M47817 Spondylosis without myelopathy or radiculopathy, lumbosacral region: Secondary | ICD-10-CM | POA: Diagnosis not present

## 2018-10-13 DIAGNOSIS — G894 Chronic pain syndrome: Secondary | ICD-10-CM | POA: Diagnosis not present

## 2018-10-13 DIAGNOSIS — Z79891 Long term (current) use of opiate analgesic: Secondary | ICD-10-CM | POA: Diagnosis not present

## 2018-11-03 ENCOUNTER — Telehealth: Payer: Self-pay | Admitting: Internal Medicine

## 2018-11-03 NOTE — Telephone Encounter (Signed)
Form has been completed and placed in providers box to sign.

## 2018-11-03 NOTE — Telephone Encounter (Signed)
Patient has dropped off [employer] duration of leave forms to be completed. Will place in Brittany's box.

## 2018-11-06 NOTE — Telephone Encounter (Signed)
Form has been signed, faxed to [facility], copy sent to scan.   LVM to inform patient original is ready to be picked up.

## 2018-11-08 ENCOUNTER — Other Ambulatory Visit: Payer: Self-pay | Admitting: Internal Medicine

## 2018-11-08 NOTE — Telephone Encounter (Signed)
Done erx 

## 2018-11-17 ENCOUNTER — Other Ambulatory Visit: Payer: Self-pay | Admitting: Internal Medicine

## 2018-11-17 ENCOUNTER — Encounter: Payer: Self-pay | Admitting: Emergency Medicine

## 2018-11-17 ENCOUNTER — Ambulatory Visit: Payer: BLUE CROSS/BLUE SHIELD | Admitting: Emergency Medicine

## 2018-11-17 DIAGNOSIS — J209 Acute bronchitis, unspecified: Secondary | ICD-10-CM

## 2018-11-17 DIAGNOSIS — J301 Allergic rhinitis due to pollen: Secondary | ICD-10-CM | POA: Diagnosis not present

## 2018-11-17 DIAGNOSIS — G894 Chronic pain syndrome: Secondary | ICD-10-CM | POA: Diagnosis not present

## 2018-11-17 DIAGNOSIS — D869 Sarcoidosis, unspecified: Secondary | ICD-10-CM

## 2018-11-17 DIAGNOSIS — M47817 Spondylosis without myelopathy or radiculopathy, lumbosacral region: Secondary | ICD-10-CM | POA: Diagnosis not present

## 2018-11-17 DIAGNOSIS — M069 Rheumatoid arthritis, unspecified: Secondary | ICD-10-CM | POA: Diagnosis not present

## 2018-11-17 DIAGNOSIS — B370 Candidal stomatitis: Secondary | ICD-10-CM

## 2018-11-17 DIAGNOSIS — R0683 Snoring: Secondary | ICD-10-CM | POA: Diagnosis not present

## 2018-11-17 DIAGNOSIS — Z79899 Other long term (current) drug therapy: Secondary | ICD-10-CM | POA: Diagnosis not present

## 2018-11-17 DIAGNOSIS — Z79891 Long term (current) use of opiate analgesic: Secondary | ICD-10-CM | POA: Diagnosis not present

## 2018-11-17 DIAGNOSIS — M79606 Pain in leg, unspecified: Secondary | ICD-10-CM | POA: Diagnosis not present

## 2018-11-17 MED ORDER — PREDNISONE 10 MG PO TABS: 10.0000 mg | ORAL_TABLET | Freq: Every day | ORAL | 0 refills | Status: DC

## 2018-11-17 MED ORDER — ALBUTEROL SULFATE HFA 108 (90 BASE) MCG/ACT IN AERS: 2.0000 | INHALATION_SPRAY | RESPIRATORY_TRACT | 2 refills | Status: DC | PRN

## 2018-11-17 MED ORDER — FLUCONAZOLE 100 MG PO TABS: 100.0000 mg | ORAL_TABLET | Freq: Every day | ORAL | 0 refills | Status: DC

## 2018-11-17 MED ORDER — DOXYCYCLINE HYCLATE 100 MG PO TABS: 100.0000 mg | ORAL_TABLET | Freq: Two times a day (BID) | ORAL | 0 refills | Status: DC

## 2018-11-17 NOTE — Assessment & Plan Note (Signed)
She does snore, no witnessed apneas.  She states that she had a PSG in 2015 that I will have available currently.  She may need a repeat depending on our suspicion for OSA.  She did have an echocardiogram in 09/2017 that did not show any evidence for PAH.

## 2018-11-17 NOTE — Progress Notes (Addendum)
Subjective:    Patient ID: Nicole Melendez, female    DOB: Mar 16, 1976, 43 y.o.   MRN: [id number]  HPI  ROV 06/30/16 -- Patient follows up today for history of sarcoidosis for which she is managed on methotrexate 6mg  only, off maintenance pred. She is on Symbicort twice a day. She also takes nasal washes, Fluticasone and Zyrtec for allergic rhinitis. She has had URI sx since about 10-12 days ago. She was treated with prednisone taper and cipro about a weeks ago. She needs refill symbicort, xopenex. She still has nasal congestion, some dyspnea, cough with yellow / green. She still feels run down. She feels like she may have thrush  ROV 12/29/16 -- follow up for sarcoidosis, on MTX 6mg  weekly per Dr Amil Amen, Symbicort. She is having some joint pain L hip, B ankles. She has noticed a slight papular rash on medial surfaces of B ankles, brownish, minimally pruritic. Last Ct chest was done 09/2015.    ROV 11/17/18 --Nicole Melendez is 43, has a history of former tobacco, sarcoidosis with associated obstructive lung disease, diabetes, GERD, hypertension.  She has been managed on methotrexate, remains on 6mg .  She was admitted at the end of 2018 with pneumonia, treated with antibiotics and tapering prednisone.  Currently she is on Symbicort.  She uses Xopenex approximately she uses fluticasone nasal spray, Zyrtec, Astelin nasal spray. Has been doing well, but started to have URI sx last week, has evolved some wheeze esp at night, increased xopenex use. She does have some snoring. States she had a PSG in 2015, no OSA. Last TTE 09/2017, PASP 27 mmHg.    Review of Systems  As per HPI     Objective:   Physical Exam  Vitals:   11/17/18 1036  BP: 130/70  Pulse: (!) 101  SpO2: 95%  Weight: (!) 336 lb 3.2 oz (152.5 kg)  Height: 5\' 7"  (1.702 m)   Gen: Pleasant, overweight, in no distress,  normal affect  ENT: No lesions,  mouth clear,  oropharynx clear, no postnasal drip  Neck: No JVD, no stridor  Lungs: No  use of accessory muscles, clear without rales or rhonchi, no wheeze on forced expiration  Cardiovascular: RRR, heart sounds normal, no murmur or gallops, no peripheral edema  Musculoskeletal: No deformities, no cyanosis or clubbing  Neuro: alert, non focal  Skin: Warm, no lesions or rashes    10/06/15 --  COMPARISON: 05/13/2010.  FINDINGS: Mediastinum/Lymph Nodes: Mediastinal lymph nodes are not enlarged by CT size criteria. Hilar regions are difficult to definitively evaluate without IV contrast. No axillary adenopathy. Heart size normal. No pericardial effusion.  Lungs/Pleura: Image quality is somewhat degraded by body habitus. Mild patchy upper and midlung zone predominant ground-glass with slight architectural distortion. A corresponding acute infectious/ inflammatory process was seen in the upper/ mid lung zones on 05/13/2010. No pleural fluid. Airway is unremarkable.  Upper abdomen: Visualized portions of the liver, left adrenal gland, left kidney, spleen and pancreas are grossly unremarkable. Postoperative changes in the stomach.  Musculoskeletal: No worrisome lytic or sclerotic lesions.  IMPRESSION: 1. Upper/midlung zone predominant ground-glass and mild architectural distortion may represent the residua of acute changes of sarcoid seen on 05/13/2010. Post infectious scarring can also have this appearance. 2. Mediastinal lymph nodes are within normal limits.      Assessment & Plan:  Acute bronchitis Acute bronchitis that appears to have evolved following a viral URI.  Cough is mainly at night, productive of sputum.  I will treat  her with a short course of doxycycline, brief prednisone taper back down to 0.  Allergic rhinitis Continue her fluticasone nasal spray, Zyrtec, Astelin nasal spray as she has been taking it.  Has had fairly good control of her rhinitis until this most recent upper respiratory infection.  Sarcoidosis of lung ([place]) Tolerating  Symbicort, continue same.  She has been using Xopenex but the cost is prohibitive.  She is had jitters in the past from albuterol but I like to try getting her back on this, possibly use 1 puff instead of to see if this is better tolerated.  She needs maintenance evaluation for her sarcoidosis.  Her CT chest and PFT were done remotely.  We will repeat both after this acute illness is resolved.  This will also allow [place] to screen for possible methotrexate pulmonary disease.  Snoring She does snore, no witnessed apneas.  She states that she had a PSG in 2015 that I will have available currently.  She may need a repeat depending on our suspicion for OSA.  She did have an echocardiogram in 09/2017 that did not show any evidence for PAH.  Oral thrush She complains of oral thrush symptoms.  This is been on and off problem for her on immunosuppression.  She is about to get prednisone I think she needs to be treated.  Will give fluticasone 100 mg x 4 days  Baltazar Apo, MD, PhD 11/17/2018, 11:07 AM [facility] [phone] or if no answer [phone]

## 2018-11-17 NOTE — Assessment & Plan Note (Signed)
She complains of oral thrush symptoms.  This is been on and off problem for her on immunosuppression.  She is about to get prednisone I think she needs to be treated.  Will give fluticasone 100 mg x 4 days

## 2018-11-17 NOTE — Addendum Note (Signed)
Addended by: Nena Polio on: 11/17/2018 11:37 AM   Modules accepted: Orders

## 2018-11-17 NOTE — Assessment & Plan Note (Signed)
Acute bronchitis that appears to have evolved following a viral URI.  Cough is mainly at night, productive of sputum.  I will treat her with a short course of doxycycline, brief prednisone taper back down to 0.

## 2018-11-17 NOTE — Assessment & Plan Note (Signed)
Continue her fluticasone nasal spray, Zyrtec, Astelin nasal spray as she has been taking it.  Has had fairly good control of her rhinitis until this most recent upper respiratory infection.

## 2018-11-17 NOTE — Addendum Note (Signed)
Addended by: Collene Gobble on: 11/17/2018 11:08 AM   Modules accepted: Orders

## 2018-11-17 NOTE — Assessment & Plan Note (Signed)
Tolerating Symbicort, continue same.  She has been using Xopenex but the cost is prohibitive.  She is had jitters in the past from albuterol but I like to try getting her back on this, possibly use 1 puff instead of to see if this is better tolerated.  She needs maintenance evaluation for her sarcoidosis.  Her CT chest and PFT were done remotely.  We will repeat both after this acute illness is resolved.  This will also allow [place] to screen for possible methotrexate pulmonary disease.

## 2018-11-17 NOTE — Patient Instructions (Addendum)
Please continue Symbicort 2 puffs twice a day.  Remember to rinse and gargle after use this medication.  We will refill this for you today. We will try stopping Xopenex, start albuterol.  You can use 1 to 2 puffs up to every 4 hours if needed for shortness of breath.  If you tolerate this without any jitteriness then we will continue Please take prednisone as directed until completely gone. Please take doxycycline as directed until completely gone We will repeat your high-resolution CT scan of the chest without contrast to follow sarcoidosis in about 1 month We will repeat your pulmonary function testing when you follow-up here in 1 month Follow with Dr. Lamonte Sakai next available with full pulmonary function testing on the same day.  I saw Mrs. Nicole Melendez needs her Symbicort refill

## 2018-11-17 NOTE — Addendum Note (Signed)
Addended by: Nena Polio on: 11/17/2018 11:06 AM   Modules accepted: Orders

## 2018-11-22 ENCOUNTER — Telehealth: Payer: Self-pay | Admitting: Emergency Medicine

## 2018-11-22 NOTE — Telephone Encounter (Signed)
Called and spoke with Patient. She is a Dr Lamonte Sakai Patient, last OV was 11/17/18. She stated that she started feeling bad Monday, 11/20/18. She is complaining of productive cough, clear phlegm, head congestion, and stated that she had a fever of 100 at times.Patinet is scheduled with Wyn Quaker, NP, at 0900, 11/23/18.  Nothing further at this time.

## 2018-11-23 ENCOUNTER — Ambulatory Visit (INDEPENDENT_AMBULATORY_CARE_PROVIDER_SITE_OTHER): Payer: BLUE CROSS/BLUE SHIELD | Admitting: Pulmonary Disease

## 2018-11-23 ENCOUNTER — Encounter: Payer: Self-pay | Admitting: Pulmonary Disease

## 2018-11-23 VITALS — BP 142/80 | HR 82 | Temp 97.8°F | Ht 67.0 in | Wt 335.8 lb

## 2018-11-23 DIAGNOSIS — J069 Acute upper respiratory infection, unspecified: Secondary | ICD-10-CM | POA: Diagnosis not present

## 2018-11-23 DIAGNOSIS — D860 Sarcoidosis of lung: Secondary | ICD-10-CM | POA: Diagnosis not present

## 2018-11-23 LAB — RESPIRATORY VIRUS PANEL
HMPV: NOT DETECTED
INFLUENZA A RNA: NOT DETECTED
Influenza B RNA: NOT DETECTED
RSV RNA: NOT DETECTED

## 2018-11-23 MED ORDER — ACETAMINOPHEN 500 MG PO TABS: 500.0000 mg | ORAL_TABLET | Freq: Four times a day (QID) | ORAL | 0 refills | Status: DC | PRN

## 2018-11-23 NOTE — Assessment & Plan Note (Signed)
Plan: Continue Symbicort 160 Continue follow-up with high-resolution CT in February Keep follow-up with Dr. Lamonte Sakai

## 2018-11-23 NOTE — Assessment & Plan Note (Signed)
Assessment: 4 days of worsening symptoms in the setting of acute bronchitis Already on doxycycline Fevers which patient is managing with children's Tylenol BMI 52.5 Known sick contacts at work Not using any over-the-counter regimens to manage cough  Plan: Respiratory virus panel today Hydrate accordingly Finish doxycycline Can use over-the-counter measures for cough such as Delsym and Mucinex DM Continue to avoid known triggers such as smoke Continue with scheduled high-resolution CT in February Keep scheduled follow-up with Dr. Lamonte Sakai

## 2018-11-23 NOTE — Progress Notes (Addendum)
RVP negative. No changes in recs.   Aaron Edelman

## 2018-11-23 NOTE — Progress Notes (Signed)
'@Patient'  ID: Nicole Melendez, female    DOB: [birth year], 43 y.o.   MRN: [id number]  Chief Complaint  Patient presents with  . Acute Visit    Cough, fever, increased fatigue    Referring provider: Biagio Borg, MD  HPI:  43 year old female number smoker followed in our office for sarcoidosis, recurrent bronchitis  PMH: Morbid obesity, GERD, Smoker/ Smoking History: Former Smoker 2014. Roommate Smokes Maintenance:  Symbicort 160 Pt of: Dr. Lamonte Sakai   11/23/2018  - Visit   43 year old female former smoker presenting to our office today for an acute visit.  Patient was last seen in our office last week was treated with doxycycline which patient is still on as well as a short course of prednisone taper for an acute bronchitis as the patient had an upper respiratory infection.  Patient reports that after completing that office visit on 11/20/2018 patient developed a fever of 101 degrees which she has been maintaining with children's Tylenol.  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  Patient denies currently smoking reports she quit in 2014.  Patient's roommate does still smoke.  Patient also endorses increased cough as well as body aches.   Tests:  FENO:  No results found for: NITRICOXIDE  PFT: PFT Results Latest Ref Rng & Units 09/23/2015 01/28/2014  FVC-Pre L 2.71 2.73  FVC-Predicted Pre % 82 82  FVC-Post L 2.77 2.74  FVC-Predicted Post % 84 82  Pre FEV1/FVC % % 84 86  Post FEV1/FCV % % 84 80  FEV1-Pre L 2.28 2.35  FEV1-Predicted Pre % 84 86  FEV1-Post L 2.31 2.19  DLCO UNC% % 64 64  DLCO COR %Predicted % 85 81  TLC L 4.11 3.90  TLC % Predicted % 76 72  RV % Predicted % 85 71    Imaging: No results found.    Specialty Problems      Pulmonary Problems   Allergic rhinitis   Acute upper respiratory infection   Wheezing   Cough   Acute bronchitis   DOE (dyspnea on exertion)   Epistaxis   Sarcoidosis of lung (HCC)   Snoring       Allergies  Allergen Reactions  . Azithromycin Hives    (z pak) hives  . Bee Venom Anaphylaxis  . Flagyl [Metronidazole] Hives and Shortness Of Breath  . Penicillins Shortness Of Breath and Swelling    Has patient had a PCN reaction causing immediate rash, facial/tongue/throat swelling, SOB or lightheadedness with hypotension: Yes Has patient had a PCN reaction causing severe rash involving mucus membranes or skin necrosis: No Has patient had a PCN reaction that required hospitalization No Has patient had a PCN reaction occurring within the last 10 years: No If all of the above answers are "NO", then may proceed with Cephalosporin use.  REACTION: swelling and difficulty breathing  . Shellfish Allergy Anaphylaxis  . Klonopin [Clonazepam] Other (See Comments)    Memory difficulty    Immunization History  Administered Date(s) Administered  . Influenza Split 07/07/2011, 07/21/2012  . Influenza,inj,Quad PF,6+ Mos 01/04/2014, 07/08/2014, 07/02/2015, 06/15/2017, 07/18/2018  . Pneumococcal Conjugate-13 10/28/2015  . Pneumococcal Polysaccharide-23 11/26/2015  . Td 10/25/1997  . Tdap 01/03/2013    Past Medical History:  Diagnosis Date  . Allergic rhinitis, cause unspecified   . Anemia   . Anxiety   . Depression   . Diabetes mellitus type II    steroid related, patient states "im no longer diabetic"  .  Essential hypertension 08/08/2015  . GERD (gastroesophageal reflux disease)   . History of blood transfusion   . Hyperlipidemia   . Menorrhagia   . Migraine   . Morbid obesity ([place])   . Otitis media 06/28/2015  . Peptic ulcer disease   . Positive ANA (antinuclear antibody) 02/14/2012  . Sarcoid    including hand per rheumatology-Dr. Amil Amen  . Sarcoidosis of lung ([place])   . Shortness of breath    on exertion  . Varicose veins with pain     Tobacco History: Social History   Tobacco Use  Smoking Status Former Smoker  . Packs/day: 1.00  . Years: 20.00  . Pack years:  20.00  . Types: Cigarettes  . Last attempt to quit: 10/25/2012  . Years since quitting: 6.0  Smokeless Tobacco Never Used  Tobacco Comment   QUIT 04/2010 AND STARTED BACK 2014 X 3 MONTHS. less than 1 ppd.  started at age 61.     Counseling given: Yes Comment: QUIT 04/2010 AND STARTED BACK 2014 X 3 MONTHS. less than 1 ppd.  started at age 55.    Continue to not smoke  Outpatient Encounter Medications as of 11/23/2018  Medication Sig  . albuterol (PROVENTIL HFA;VENTOLIN HFA) 108 (90 Base) MCG/ACT inhaler Inhale 2 puffs into the lungs every 4 (four) hours as needed for wheezing or shortness of breath.  . ALPRAZolam (XANAX) 0.5 MG tablet TAKE 1 TABLET(0.5 MG) BY MOUTH THREE TIMES DAILY AS NEEDED  . azelastine (ASTELIN) 0.1 % nasal spray Place 2 sprays into both nostrils 2 (two) times daily. Use in each nostril as directed  . Blood Glucose Monitoring Suppl (ONE TOUCH ULTRA 2) W/DEVICE KIT Use as directed four times daily. Dx: E11.9  . budesonide-formoterol (SYMBICORT) 160-4.5 MCG/ACT inhaler Inhale 2 puffs into the lungs 2 (two) times daily.  . cetirizine (ZYRTEC) 10 MG tablet TAKE 1 TABLET(10 MG) BY MOUTH DAILY  . Diclofenac Sodium (PENNSAID) 2 % SOLN Apply 1 pump twice daily as needed.  . doxycycline (VIBRA-TABS) 100 MG tablet Take 1 tablet (100 mg total) by mouth 2 (two) times daily.  . fluconazole (DIFLUCAN) 100 MG tablet Take 1 tablet (100 mg total) by mouth daily.  . fluticasone (FLONASE) 50 MCG/ACT nasal spray Place 2 sprays into both nostrils daily.  . furosemide (LASIX) 40 MG tablet 1 tab by mouth every day in the AM and then 1 as needed only for persistent swelling in the PM  . gabapentin (NEURONTIN) 300 MG capsule Take 2 capsules (600 mg total) by mouth 3 (three) times daily.  Marland Kitchen glucose blood (ONE TOUCH ULTRA TEST) test strip Use to check blood sugars four times a day Dx E11.9  . hydrOXYzine (ATARAX/VISTARIL) 10 MG tablet TAKE 1 TABLET(10 MG) BY MOUTH THREE TIMES DAILY AS NEEDED  .  insulin lispro (HUMALOG) 100 UNIT/ML injection INJECT UP TO 12 UNITS INTO THE SKIN THREE TIMES DAILY WITH MEALS WITH SLIDING SCALE AS DIRECTED  . Insulin Syringe-Needle U-100 25G X 1" 1 ML MISC Use to administer humalog insulin three times a day Dx E11.9  . levalbuterol (XOPENEX HFA) 45 MCG/ACT inhaler Inhale 1-2 puffs into the lungs every 8 (eight) hours as needed for wheezing or shortness of breath. USE 2 PUFFS 3 TIMES DAILY X 5 DAYS THEN BACK TO HOME REGIMEN AS NEEDED.  Marland Kitchen lidocaine (LIDODERM) 5 % Place 1 patch onto the skin daily. Remove & Discard patch within 12 hours or as directed by MD  . omeprazole (PRILOSEC) 40  MG capsule TAKE 1 CAPSULE(40 MG) BY MOUTH DAILY  . ONE TOUCH ULTRA TEST test strip TEST BLOOD SUGAR FOUR TIMES DAILY  . oxyCODONE-acetaminophen (PERCOCET) 10-325 MG tablet TK 1 T PO UP TO TID IF NEEDED  . potassium chloride (K-DUR) 10 MEQ tablet 3 tab by mouth per day only when taking the lasix  . potassium chloride (K-DUR,KLOR-CON) 10 MEQ tablet TK 3 TS PO D ONLY WHEN TAKING THE LASIX  . predniSONE (DELTASONE) 10 MG tablet Take 1 tablet (10 mg total) by mouth daily with breakfast. Patient to take 3 tablets daily for 3 days, 2 tablets daily for 3 days, 1 tablet daily for 3 days then stop.  . rosuvastatin (CRESTOR) 40 MG tablet   . sertraline (ZOLOFT) 100 MG tablet Take 2 tablets (200 mg total) by mouth daily.  . SUMAtriptan (IMITREX) 100 MG tablet Take 1 tablet by mouth at the onset of a headache. May repeat in 2 hours if headache persists or recurs.  Marland Kitchen telmisartan (MICARDIS) 40 MG tablet TAKE 1 TABLET(40 MG) BY MOUTH DAILY  . TURMERIC PO Take 1 tablet by mouth daily.  . vitamin B-12 (CYANOCOBALAMIN) 1000 MCG tablet Take 1,000 mcg by mouth daily.  . Vitamin D, Ergocalciferol, (DRISDOL) 50000 units CAPS capsule TAKE 1 CAPSULE BY MOUTH EVERY 7 DAYS  . zonisamide (ZONEGRAN) 100 MG capsule TAKE 1 CAPSULE(100 MG) BY MOUTH DAILY  . acetaminophen (TYLENOL) 500 MG tablet Take 1 tablet (500  mg total) by mouth every 6 (six) hours as needed.   No facility-administered encounter medications on file as of 11/23/2018.      Review of Systems  Review of Systems  Constitutional: Positive for chills, fatigue and fever (tmax 101 - 11/20/18, 11/23/18 - 99).  HENT: Positive for congestion and sore throat.   Respiratory: Positive for cough (productive cough with clear/ green, sometimes dry ), shortness of breath and wheezing.   Cardiovascular: Positive for chest pain (when coughing ) and palpitations.  Gastrointestinal: Negative for diarrhea, nausea and vomiting.     Physical Exam  BP (!) 142/80 (BP Location: Left Wrist, Cuff Size: Normal)   Pulse 82   Temp 97.8 F (36.6 C) (Oral)   Ht '5\' 7"'  (1.702 m)   Wt (!) 335 lb 12.8 oz (152.3 kg)   LMP 07/17/2012   SpO2 98%   BMI 52.59 kg/m   Wt Readings from Last 5 Encounters:  11/23/18 (!) 335 lb 12.8 oz (152.3 kg)  11/17/18 (!) 336 lb 3.2 oz (152.5 kg)  09/19/18 (!) 337 lb 6 oz (153 kg)  08/31/18 (!) 334 lb (151.5 kg)  08/25/18 (!) 334 lb 1.3 oz (151.5 kg)   Physical Exam  Constitutional: She is oriented to person, place, and time and well-developed, well-nourished, and in no distress. No distress.  HENT:  Head: Normocephalic and atraumatic.  Right Ear: Hearing, external ear and ear canal normal.  Left Ear: Hearing, external ear and ear canal normal.  Nose: Mucosal edema and rhinorrhea present. Right sinus exhibits no maxillary sinus tenderness and no frontal sinus tenderness. Left sinus exhibits no maxillary sinus tenderness and no frontal sinus tenderness.  Mouth/Throat: Uvula is midline and oropharynx is clear and moist. No oropharyngeal exudate.  + postnasal drip + TMs with effusion without infection bilaterally  Eyes: Pupils are equal, round, and reactive to light.  Neck: Normal range of motion. Neck supple. No JVD present.  Cardiovascular: Normal rate, regular rhythm and normal heart sounds.  Pulmonary/Chest: Effort  normal and breath  sounds normal. No accessory muscle usage. No respiratory distress. She has no decreased breath sounds. She has no wheezes. She has no rhonchi.  Abdominal: Soft. Bowel sounds are normal. There is no abdominal tenderness.  Musculoskeletal: Normal range of motion.        General: No edema.  Lymphadenopathy:    She has no cervical adenopathy.  Neurological: She is alert and oriented to person, place, and time. Gait normal.  Skin: Skin is warm and dry. She is not diaphoretic. No erythema.  Psychiatric: Mood, memory, affect and judgment normal.  Nursing note and vitals reviewed.   Lab Results:  CBC    Component Value Date/Time   WBC 6.8 11/08/2017 0848   RBC 4.50 11/08/2017 0848   HGB 12.7 11/08/2017 0848   HGB 11.4 (L) 03/07/2013 0458   HCT 38.8 11/08/2017 0848   HCT 33.3 (L) 03/07/2013 0458   PLT 303.0 11/08/2017 0848   PLT 295 03/07/2013 0458   MCV 86.3 11/08/2017 0848   MCV 78 (L) 03/07/2013 0458   MCH 28.9 09/27/2017 0604   MCHC 32.8 11/08/2017 0848   RDW 14.4 11/08/2017 0848   RDW 18.3 (H) 03/07/2013 0458   LYMPHSABS 1.4 11/08/2017 0848   LYMPHSABS 0.7 (L) 03/07/2013 0458   MONOABS 0.3 11/08/2017 0848   MONOABS 0.6 03/07/2013 0458   EOSABS 0.2 11/08/2017 0848   EOSABS 0.0 03/07/2013 0458   BASOSABS 0.1 11/08/2017 0848   BASOSABS 0.1 03/07/2013 0458    BMET    Component Value Date/Time   NA 138 08/31/2018 0945   NA 137 03/07/2013 0458   K 4.1 08/31/2018 0945   K 4.1 03/07/2013 0458   CL 105 08/31/2018 0945   CL 107 03/07/2013 0458   CO2 25 08/31/2018 0945   CO2 24 03/07/2013 0458   GLUCOSE 130 (H) 08/31/2018 0945   GLUCOSE 96 03/07/2013 0458   BUN 8 08/31/2018 0945   BUN 6 (L) 03/07/2013 0458   CREATININE 0.75 08/31/2018 0945   CREATININE 0.76 03/07/2013 0458   CALCIUM 9.4 08/31/2018 0945   CALCIUM 9.0 03/07/2013 0458   GFRNONAA >60 09/27/2017 0604   GFRNONAA >60 03/07/2013 0458   GFRAA >60 09/27/2017 0604   GFRAA >60 03/07/2013 0458     BNP No results found for: BNP  ProBNP No results found for: PROBNP    Assessment & Plan:   Acute upper respiratory infection Assessment: 4 days of worsening symptoms in the setting of acute bronchitis Already on doxycycline Fevers which patient is managing with children's Tylenol BMI 52.5 Known sick contacts at work Not using any over-the-counter regimens to manage cough  Plan: Respiratory virus panel today Hydrate accordingly Finish doxycycline Can use over-the-counter measures for cough such as Delsym and Mucinex DM Continue to avoid known triggers such as smoke Continue with scheduled high-resolution CT in February Keep scheduled follow-up with Dr. Lamonte Sakai    Sarcoidosis of lung [facility]) Plan: Continue Symbicort 160 Continue follow-up with high-resolution CT in February Keep follow-up with Dr. Mila Merry, NP 11/23/2018   This appointment was 28 minutes long with over 50% of the time in direct face-to-face patient care, assessment, plan of care, and follow-up.

## 2018-11-23 NOTE — Assessment & Plan Note (Signed)
Assessment: BMI 52.5  Plan: Consider [facility] or STOP-BANG at next office visit to evaluate for obstructive sleep apnea Consider referral to healthy weight and wellness

## 2018-11-23 NOTE — Patient Instructions (Addendum)
We will swab you the respiratory virus panel/flu  Finish doxycycline as prescribed by Dr. Paulla Fore prednisone as prescribed by Dr. Lamonte Sakai  Can use tylenol 500 mg every 6 hours as needed for fever and body aches  Can use over-the-counter cough medicine such as Delsym or Mucinex D for management of cough  Continue Symbicort 160 >>> 2 puffs in the morning right when you wake up, rinse out your mouth after use, 12 hours later 2 puffs, rinse after use >>> Take this daily, no matter what >>> This is not a rescue inhaler   Complete high-res CT in February as scheduled  Keep follow-up with Dr. Lamonte Sakai      It is flu season:   >>>Remember to be washing your hands regularly, using hand sanitizer, be careful to use around herself with has contact with people who are sick will increase her chances of getting sick yourself. >>> Best ways to protect herself from the flu: Receive the yearly flu vaccine, practice good hand hygiene washing with soap and also using hand sanitizer when available, eat a nutritious meals, get adequate rest, hydrate appropriately   Please contact the office if your symptoms worsen or you have concerns that you are not improving.   Thank you for choosing [facility] for your healthcare, and for allowing [place] to partner with you on your healthcare journey. I am thankful to be able to provide care to you today.   Nicole Quaker FNP-C    Upper Respiratory Infection, Adult An upper respiratory infection (URI) affects the nose, throat, and upper air passages. URIs are caused by germs (viruses). The most common type of URI is often called "the common cold." Medicines cannot cure URIs, but you can do things at home to relieve your symptoms. URIs usually get better within 7-10 days. Follow these instructions at home: Activity  Rest as needed.  If you have a fever, stay home from work or school until your fever is gone, or until your doctor says you may return to  work or school. ? You should stay home until you cannot spread the infection anymore (you are not contagious). ? Your doctor may have you wear a face mask so you have less risk of spreading the infection. Relieving symptoms  Gargle with a salt-water mixture 3-4 times a day or as needed. To make a salt-water mixture, completely dissolve -1 tsp of salt in 1 cup of warm water.  Use a cool-mist humidifier to add moisture to the air. This can help you breathe more easily. Eating and drinking   Drink enough fluid to keep your pee (urine) pale yellow.  Eat soups and other clear broths. General instructions   Take over-the-counter and prescription medicines only as told by your doctor. These include cold medicines, fever reducers, and cough suppressants.  Do not use any products that contain nicotine or tobacco. These include cigarettes and e-cigarettes. If you need help quitting, ask your doctor.  Avoid being where people are smoking (avoid secondhand smoke).  Make sure you get regular shots and get the flu shot every year.  Keep all follow-up visits as told by your doctor. This is important. How to avoid spreading infection to others   Wash your hands often with soap and water. If you do not have soap and water, use hand sanitizer.  Avoid touching your mouth, face, eyes, or nose.  Cough or sneeze into a tissue or your sleeve or elbow. Do not cough or sneeze  into your hand or into the air. Contact a doctor if:  You are getting worse, not better.  You have any of these: ? A fever. ? Chills. ? Brown or red mucus in your nose. ? Yellow or brown fluid (discharge)coming from your nose. ? Pain in your face, especially when you bend forward. ? Swollen neck glands. ? Pain with swallowing. ? White areas in the back of your throat. Get help right away if:  You have shortness of breath that gets worse.  You have very bad or constant: ? Headache. ? Ear pain. ? Pain in your  forehead, behind your eyes, and over your cheekbones (sinus pain). ? Chest pain.  You have long-lasting (chronic) lung disease along with any of these: ? Wheezing. ? Long-lasting cough. ? Coughing up blood. ? A change in your usual mucus.  You have a stiff neck.  You have changes in your: ? Vision. ? Hearing. ? Thinking. ? Mood. Summary  An upper respiratory infection (URI) is caused by a germ called a virus. The most common type of URI is often called "the common cold."  URIs usually get better within 7-10 days.  Take over-the-counter and prescription medicines only as told by your doctor. This information is not intended to replace advice given to you by your health care provider. Make sure you discuss any questions you have with your health care provider. Document Released: 03/29/2008 Document Revised: 06/03/2017 Document Reviewed: 06/03/2017 Elsevier Interactive Patient Education  2019 [employer].

## 2018-11-24 ENCOUNTER — Telehealth: Payer: Self-pay | Admitting: Pulmonary Disease

## 2018-11-24 ENCOUNTER — Other Ambulatory Visit: Payer: Self-pay

## 2018-11-24 ENCOUNTER — Encounter: Payer: Self-pay | Admitting: *Deleted

## 2018-11-24 ENCOUNTER — Telehealth: Payer: Self-pay | Admitting: Emergency Medicine

## 2018-11-24 ENCOUNTER — Emergency Department (HOSPITAL_COMMUNITY): Payer: BLUE CROSS/BLUE SHIELD

## 2018-11-24 ENCOUNTER — Encounter (HOSPITAL_COMMUNITY): Payer: Self-pay | Admitting: *Deleted

## 2018-11-24 ENCOUNTER — Emergency Department (HOSPITAL_COMMUNITY)
Admission: EM | Admit: 2018-11-24 | Discharge: 2018-11-24 | Disposition: A | Discharge status: Home / Self Care | Payer: BLUE CROSS/BLUE SHIELD | Attending: Emergency Medicine | Admitting: Emergency Medicine

## 2018-11-24 DIAGNOSIS — Z794 Long term (current) use of insulin: Secondary | ICD-10-CM | POA: Diagnosis not present

## 2018-11-24 DIAGNOSIS — R05 Cough: Secondary | ICD-10-CM | POA: Diagnosis not present

## 2018-11-24 DIAGNOSIS — B9789 Other viral agents as the cause of diseases classified elsewhere: Secondary | ICD-10-CM | POA: Diagnosis not present

## 2018-11-24 DIAGNOSIS — J069 Acute upper respiratory infection, unspecified: Secondary | ICD-10-CM

## 2018-11-24 DIAGNOSIS — Z87891 Personal history of nicotine dependence: Secondary | ICD-10-CM | POA: Insufficient documentation

## 2018-11-24 DIAGNOSIS — I5033 Acute on chronic diastolic (congestive) heart failure: Secondary | ICD-10-CM | POA: Diagnosis not present

## 2018-11-24 DIAGNOSIS — I110 Hypertensive heart disease with heart failure: Secondary | ICD-10-CM | POA: Diagnosis not present

## 2018-11-24 DIAGNOSIS — I10 Essential (primary) hypertension: Secondary | ICD-10-CM | POA: Diagnosis not present

## 2018-11-24 DIAGNOSIS — Z79899 Other long term (current) drug therapy: Secondary | ICD-10-CM | POA: Insufficient documentation

## 2018-11-24 DIAGNOSIS — E119 Type 2 diabetes mellitus without complications: Secondary | ICD-10-CM | POA: Insufficient documentation

## 2018-11-24 LAB — CBG MONITORING, ED: Glucose-Capillary: 128 mg/dL — ABNORMAL HIGH (ref 70–99)

## 2018-11-24 MED ORDER — IPRATROPIUM BROMIDE 0.02 % IN SOLN
0.5000 mg | Freq: Once | RESPIRATORY_TRACT | Status: AC
  Administered 2018-11-24: 0.5 mg via RESPIRATORY_TRACT
  Filled 2018-11-24: qty 2.5

## 2018-11-24 MED ORDER — GUAIFENESIN 100 MG/5ML PO SOLN
5.0000 mL | Freq: Once | ORAL | Status: AC
  Administered 2018-11-24: 100 mg via ORAL
  Filled 2018-11-24: qty 10

## 2018-11-24 MED ORDER — ALBUTEROL SULFATE (2.5 MG/3ML) 0.083% IN NEBU
5.0000 mg | INHALATION_SOLUTION | Freq: Once | RESPIRATORY_TRACT | Status: AC
  Administered 2018-11-24: 5 mg via RESPIRATORY_TRACT
  Filled 2018-11-24: qty 6

## 2018-11-24 MED ORDER — GUAIFENESIN 100 MG/5ML PO SYRP: 100.0000 mg | ORAL_SOLUTION | Freq: Four times a day (QID) | ORAL | 0 refills | Status: DC | PRN

## 2018-11-24 MED ORDER — IPRATROPIUM-ALBUTEROL 0.5-2.5 (3) MG/3ML IN SOLN
3.0000 mL | Freq: Once | RESPIRATORY_TRACT | Status: AC
  Administered 2018-11-24: 3 mL via RESPIRATORY_TRACT
  Filled 2018-11-24: qty 3

## 2018-11-24 NOTE — Discharge Instructions (Signed)
Chest x-ray today was reassuring, no sign of pneumonia. Your blood sugar was reassuring today in the mid 100s. Finish antibiotics and prednisone as prescribed. Use cough syrup as needed, especially at night for sleep. Continue taking allergy medicine including Zyrtec, try to use Flonase 2 help with postnasal drip and nasal congestion. Follow-up with your pulmonologist on Monday for symptoms not improving. Return to the emergency room with any new, worsening, concerning symptoms.

## 2018-11-24 NOTE — ED Provider Notes (Signed)
[facility] DEPT Provider Note   CSN: [id number] Arrival date & time: 11/24/18  0350     History   Chief Complaint Chief Complaint  Patient presents with  . Fever  . Cough  . Nasal Congestion    HPI Nicole Melendez is a 43 y.o. female with a history of diabetes mellitus type II, sarcoidosis of the lung and hand, HTN, anxiety, GERD, and anemia who presents to the emergency department with wheezing, productive cough, shortness of breath, body aches, headache, nasal congestion, bilateral otalgia, and fever for the last week that is been constant is significantly worsened in the last 24 hours.  She was seen by pulmonology on January 24 and was started on a prednisone taper and doxycycline.  Her symptoms did not improve so she was seen again by pulmonology yesterday and was swabbed for influenza, which was negative.  She reports her symptoms have continued to worsen since she was seen yesterday and feels similar to when she had pneumonia so she came to the emergency department for further evaluation.  She denies abdominal pain, nausea, vomiting, diarrhea, sore throat, neck pain or stiffness.   Reports that her sugars have been running in the high 200s and 300s at home since starting prednisone.  The history is provided by the patient. No language interpreter was used.  Fever  Associated symptoms: chills, congestion, cough, ear pain and myalgias   Associated symptoms: no chest pain, no nausea, no rash, no sore throat and no vomiting   Cough  Associated symptoms: chills, ear pain, fever, myalgias, shortness of breath and wheezing   Associated symptoms: no chest pain, no rash and no sore throat     Past Medical History:  Diagnosis Date  . Allergic rhinitis, cause unspecified   . Anemia   . Anxiety   . Depression   . Diabetes mellitus type II    steroid related, patient states "im no longer diabetic"  . Essential hypertension 08/08/2015  . GERD  (gastroesophageal reflux disease)   . History of blood transfusion   . Hyperlipidemia   . Menorrhagia   . Migraine   . Morbid obesity ([place])   . Otitis media 06/28/2015  . Peptic ulcer disease   . Positive ANA (antinuclear antibody) 02/14/2012  . Sarcoid    including hand per rheumatology-Dr. Amil Amen  . Sarcoidosis of lung ([place])   . Shortness of breath    on exertion  . Varicose veins with pain     Patient Active Problem List   Diagnosis Date Noted  . Snoring 11/17/2018  . Eye symptom 08/03/2018  . Right lumbar radiculopathy 05/08/2018  . Chronic pain 10/11/2017  . Sarcoidosis of lung ([place])   . Acute on chronic diastolic CHF (congestive heart failure) ([place]) 09/22/2017  . Rheumatoid arthritis ([place]) 09/18/2017  . Chronic diastolic CHF (congestive heart failure) ([place]) 09/18/2017  . Pancreatitis 06/26/2017  . Rheumatoid arteritis ([place]) 06/26/2017  . Right ankle sprain 06/26/2017  . Oral thrush 06/22/2017  . Possible exposure to STD 06/15/2017  . Diarrhea 04/10/2017  . Bilateral hand swelling 04/07/2017  . Abdominal pain 04/07/2017  . Abnormality of gait 01/26/2017  . Avulsion fracture of right ankle 12/01/2016  . Ulcer of esophagus with bleeding   . Gastritis and gastroduodenitis   . Blepharitis of right upper eyelid 10/09/2016  . Peroneal tendinitis of lower leg, right 10/06/2016  . Right ankle pain 09/16/2016  . Dizziness 03/11/2016  . Mixed headache 03/09/2016  . Chronic venous  insufficiency 02/24/2016  . Gastroenteritis 12/30/2015  . Headache 12/30/2015  . Epistaxis 12/09/2015  . 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 Cervical muscle strain 12/03/2015  . Right-sided face pain 11/26/2015  . Multiple contusions 11/26/2015  . Neck pain, bilateral posterior 11/26/2015  . Low back pain 11/26/2015  . Spinal stenosis of lumbar region 10/28/2015  . Chronic sciatica of left side 10/28/2015  . DOE (dyspnea on exertion) 09/02/2015  . Acute bronchitis 08/22/2015  . Essential  hypertension 08/08/2015  . Diaphoresis 08/07/2015  . Cough 07/02/2015  . Vaginitis and vulvovaginitis 06/29/2015  . Otitis media 06/28/2015  . Thrush 06/24/2015  . Nausea & vomiting 06/10/2015  . Acute upper respiratory infection 11/14/2014  . Wheezing 11/14/2014  . Angioedema 03/19/2014  . Vaginitis 11/19/2012  . Smoker 11/19/2012  . Positive ANA (antinuclear antibody) 02/14/2012  . Polyarthritis 02/11/2012  . Migraine 04/05/2011  . Preventative health care 02/21/2011  . Allergic rhinitis 02/17/2011  . URTICARIA 09/29/2010  . Depression 09/14/2010  . Diabetes mellitus with neuropathy ([place]) 07/31/2010  . PERIPHERAL EDEMA 07/31/2010  . INSOMNIA-SLEEP DISORDER-UNSPEC 06/19/2010  . 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 MENORRHAGIA 05/15/2010  . Hyperlipidemia 12/17/2009  . Iron deficiency anemia 12/17/2009  . Anxiety state 12/17/2009  . Morbid obesity ([place]) 07/02/2007  . Gastroesophageal reflux disease 07/02/2007  . PEPTIC ULCER DISEASE 07/02/2007    Past Surgical History:  Procedure Laterality Date  . ABDOMINAL HYSTERECTOMY    . COLONOSCOPY WITH PROPOFOL N/A 10/19/2016   Procedure: COLONOSCOPY WITH PROPOFOL;  Surgeon: Mauri Pole, MD;  Location: [facility] END[facility]COPY;  Service: Endoscopy;  Laterality: N/A;  . ESOPHAGOGASTRODUODENOSCOPY (EGD) WITH PROPOFOL N/A 10/19/2016   Procedure: ESOPHAGOGASTRODUODENOSCOPY (EGD) WITH PROPOFOL;  Surgeon: Mauri Pole, MD;  Location: [facility] ENDOSCOPY;  Service: Endoscopy;  Laterality: N/A;  . HERNIA MESH REMOVAL  02/2013  . uterine ablation  03/2010  . WISDOM TOOTH EXTRACTION       OB History   No obstetric history on file.      Home Medications    Prior to Admission medications   Medication Sig Start Date End Date Taking? Authorizing Provider  acetaminophen (TYLENOL) 500 MG tablet Take 1 tablet (500 mg total) by mouth every 6 (six) hours as needed. 11/23/18  Yes Lauraine Rinne, NP  ALPRAZolam (XANAX) 0.5 MG tablet TAKE 1 TABLET(0.5  MG) BY MOUTH THREE TIMES DAILY AS NEEDED Patient taking differently: Take 0.5 mg by mouth 3 (three) times daily as needed for anxiety.  11/08/18  Yes Biagio Borg, MD  azelastine (ASTELIN) 0.1 % nasal spray Place 2 sprays into both nostrils 2 (two) times daily. Use in each nostril as directed 03/11/16  Yes Biagio Borg, MD  cetirizine (ZYRTEC) 10 MG tablet TAKE 1 TABLET(10 MG) BY MOUTH DAILY Patient taking differently: Take 10 mg by mouth daily.  08/28/18  Yes Biagio Borg, MD  Diclofenac Sodium (PENNSAID) 2 % SOLN Apply 1 pump twice daily as needed. Patient taking differently: Apply 1 pump twice daily as needed pain 08/31/18  Yes Biagio Borg, MD  doxycycline (VIBRA-TABS) 100 MG tablet Take 1 tablet (100 mg total) by mouth 2 (two) times daily. 11/17/18  Yes Collene Gobble, MD  fluconazole (DIFLUCAN) 100 MG tablet Take 1 tablet (100 mg total) by mouth daily. 11/17/18  Yes Collene Gobble, MD  fluticasone (FLONASE) 50 MCG/ACT nasal spray Place 2 sprays into both nostrils daily. 09/23/17  Yes Eugenie Filler, MD  furosemide (LASIX) 40 MG tablet 1  tab by mouth every day in the AM and then 1 as needed only for persistent swelling in the PM 06/16/16  Yes Biagio Borg, MD  gabapentin (NEURONTIN) 300 MG capsule Take 2 capsules (600 mg total) by mouth 3 (three) times daily. 05/23/18  Yes Biagio Borg, MD  hydrOXYzine (ATARAX/VISTARIL) 10 MG tablet TAKE 1 TABLET(10 MG) BY MOUTH THREE TIMES DAILY AS NEEDED Patient taking differently: Take 10 mg by mouth 3 (three) times daily as needed for anxiety.  11/17/18  Yes Biagio Borg, MD  insulin lispro (HUMALOG) 100 UNIT/ML injection INJECT UP TO 12 UNITS INTO THE SKIN THREE TIMES DAILY WITH MEALS WITH SLIDING SCALE AS DIRECTED 10/11/17  Yes Biagio Borg, MD  levalbuterol [facility] HFA) 45 MCG/ACT inhaler Inhale 1-2 puffs into the lungs every 8 (eight) hours as needed for wheezing or shortness of breath. USE 2 PUFFS 3 TIMES DAILY X 5 DAYS THEN BACK TO HOME REGIMEN AS  NEEDED. 09/22/17  Yes Eugenie Filler, MD  lidocaine (LIDODERM) 5 % Place 1 patch onto the skin daily. Remove & Discard patch within 12 hours or as directed by MD 08/31/18  Yes Biagio Borg, MD  omeprazole (PRILOSEC) 40 MG capsule TAKE 1 CAPSULE(40 MG) BY MOUTH DAILY Patient taking differently: Take 40 mg by mouth daily.  10/05/18  Yes Biagio Borg, MD  oxyCODONE-acetaminophen (PERCOCET) 10-325 MG tablet Take 1 tablet by mouth every 8 (eight) hours as needed for pain.  08/18/18  Yes [provider]  potassium chloride (K-DUR) 10 MEQ tablet 3 tab by mouth per day only when taking the lasix 07/18/18  Yes Biagio Borg, MD  predniSONE (DELTASONE) 10 MG tablet Take 1 tablet (10 mg total) by mouth daily with breakfast. Patient to take 3 tablets daily for 3 days, 2 tablets daily for 3 days, 1 tablet daily for 3 days then stop. 11/17/18  Yes Collene Gobble, MD  rosuvastatin (CRESTOR) 40 MG tablet  07/14/18  Yes [provider]  sertraline (ZOLOFT) 100 MG tablet Take 2 tablets (200 mg total) by mouth daily. 09/15/18  Yes Biagio Borg, MD  SUMAtriptan (IMITREX) 100 MG tablet Take 1 tablet by mouth at the onset of a headache. May repeat in 2 hours if headache persists or recurs. 10/13/16  Yes Jaffe, Adam R, DO  telmisartan (MICARDIS) 40 MG tablet TAKE 1 TABLET(40 MG) BY MOUTH DAILY Patient taking differently: Take 40 mg by mouth daily.  05/11/17  Yes Biagio Borg, MD  TURMERIC PO Take 1 tablet by mouth daily.   Yes [provider]  vitamin B-12 (CYANOCOBALAMIN) 1000 MCG tablet Take 1,000 mcg by mouth daily.   Yes [provider]  Vitamin D, Ergocalciferol, (DRISDOL) 50000 units CAPS capsule TAKE 1 CAPSULE BY MOUTH EVERY 7 DAYS Patient taking differently: Take 50,000 Units by mouth every 7 (seven) days.  02/16/17  Yes Hulan Saas M, DO  zonisamide (ZONEGRAN) 100 MG capsule TAKE 1 CAPSULE(100 MG) BY MOUTH DAILY 09/12/18  Yes Jaffe, Adam R, DO  albuterol (PROVENTIL  HFA;VENTOLIN HFA) 108 (90 Base) MCG/ACT inhaler Inhale 2 puffs into the lungs every 4 (four) hours as needed for wheezing or shortness of breath. Patient not taking: Reported on 11/24/2018 11/17/18   Collene Gobble, MD  Blood Glucose Monitoring Suppl (ONE TOUCH ULTRA 2) W/DEVICE KIT Use as directed four times daily. Dx: E11.9 08/11/15   Renato Shin, MD  budesonide-formoterol [facility]) 160-4.5 MCG/ACT inhaler Inhale 2 puffs into  the lungs 2 (two) times daily. Patient not taking: Reported on 11/24/2018 06/30/16   Collene Gobble, MD  glucose blood (ONE TOUCH ULTRA TEST) test strip Use to check blood sugars four times a day Dx E11.9 08/11/15   Renato Shin, MD  Insulin Syringe-Needle U-100 25G X 1" 1 ML MISC Use to administer humalog insulin three times a day Dx E11.9 10/13/16   Biagio Borg, MD  ONE TOUCH ULTRA TEST test strip TEST BLOOD SUGAR FOUR TIMES DAILY 08/28/18   Biagio Borg, MD    Family History Family History  Problem Relation Age of Onset  . Allergies Mother   . Heart attack Mother   . Diabetes Mother   . Diabetes Father   . Heart disease Father   . Rheum arthritis Father   . Stroke Father   . Hypertension Other   . Ovarian cancer Maternal Aunt   . Lung cancer Maternal Aunt   . Breast cancer Maternal Aunt   . Bone cancer Maternal Aunt   . Stroke Maternal Uncle   . Other Neg Hx     Social History Social History   Tobacco Use  . Smoking status: Former Smoker    Packs/day: 1.00    Years: 20.00    Pack years: 20.00    Types: Cigarettes    Last attempt to quit: 10/25/2012    Years since quitting: 6.0  . Smokeless tobacco: Never Used  . Tobacco comment: QUIT 04/2010 AND STARTED BACK 2014 X 3 MONTHS. less than 1 ppd.  started at age 28.    Substance Use Topics  . Alcohol use: No  . Drug use: No     Allergies   Azithromycin; Bee venom; Flagyl [metronidazole]; Penicillins; Shellfish allergy; and Klonopin [clonazepam]   Review of Systems Review of Systems    Constitutional: Positive for chills and fever.  HENT: Positive for congestion, ear pain, sinus pressure and sinus pain. Negative for ear discharge, postnasal drip and sore throat.   Respiratory: Positive for cough, shortness of breath and wheezing. Negative for chest tightness.   Cardiovascular: Negative for chest pain, palpitations and leg swelling.  Gastrointestinal: Negative for abdominal pain, nausea, rectal pain and vomiting.  Genitourinary: Negative for frequency, urgency, vaginal bleeding, vaginal discharge and vaginal pain.  Musculoskeletal: Positive for myalgias. Negative for neck pain and neck stiffness.  Skin: Negative for rash.  Neurological: Negative for syncope and weakness.     Physical Exam Updated Vital Signs BP 135/89   Pulse 72   Temp 98.1 F (36.7 C) (Oral)   Resp 14   Ht '5\' 7"'  (1.702 m)   Wt (!) 146.5 kg   LMP 07/17/2012   SpO2 99%   BMI 50.59 kg/m   Physical Exam Vitals signs and nursing note reviewed.  Constitutional:      General: She is not in acute distress.    Appearance: She is obese. She is not ill-appearing, toxic-appearing or diaphoretic.  HENT:     Head: Normocephalic.     Comments: Bilateral TMs are bulging.  No mastoid tenderness bilaterally.  Bilateral canals are unremarkable.    Nose: Congestion present.     Right Sinus: Maxillary sinus tenderness present. No frontal sinus tenderness.     Left Sinus: No maxillary sinus tenderness or frontal sinus tenderness.     Mouth/Throat:     Mouth: Mucous membranes are moist.     Pharynx: Oropharynx is clear.     Tonsils: No tonsillar exudate.  Eyes:  Conjunctiva/sclera: Conjunctivae normal.  Neck:     Musculoskeletal: Neck supple.     Comments: Shotty right anterior cervical lymphadenopathy. Cardiovascular:     Rate and Rhythm: Normal rate and regular rhythm.     Heart sounds: No murmur. No friction rub. No gallop.   Pulmonary:     Effort: Pulmonary effort is normal. No respiratory  distress.     Breath sounds: No stridor. No rhonchi.     Comments: Lung sounds are clear to auscultation bilaterally, but exam is somewhat limited secondary to body habitus Chest:     Chest wall: No tenderness.  Abdominal:     General: There is no distension.     Palpations: Abdomen is soft. There is no mass.     Tenderness: There is no abdominal tenderness. There is no right CVA tenderness, left CVA tenderness, guarding or rebound.     Hernia: No hernia is present.  Skin:    General: Skin is warm.     Findings: No rash.  Neurological:     Mental Status: She is alert.  Psychiatric:        Behavior: Behavior normal.      ED Treatments / Results  Labs (all labs ordered are listed, but only abnormal results are displayed) Labs Reviewed  CBG MONITORING, ED    EKG None  Radiology No results found.  Procedures Procedures (including critical care time)  Medications Ordered in ED Medications  albuterol (PROVENTIL) (2.5 MG/3ML) 0.083% nebulizer solution 5 mg (5 mg Nebulization Given 11/24/18 0552)  ipratropium (ATROVENT) nebulizer solution 0.5 mg (0.5 mg Nebulization Given 11/24/18 0552)  guaiFENesin (ROBITUSSIN) 100 MG/5ML solution 100 mg (100 mg Oral Given 11/24/18 0551)     Initial Impression / Assessment and Plan / ED Course  I have reviewed the triage vital signs and the nursing notes.  Pertinent labs & imaging results that were available during my care of the patient were reviewed by me and considered in my medical decision making (see chart for details).     43 year old female with a history of diabetes mellitus type II, sarcoidosis of the lung and hand, HTN, anxiety, GERD, and anemia presenting with 1 week of fever, chills, shortness of breath, wheezing, nasal congestion, otalgia, and headache.  She has been on doxycycline and a prednisone taper since January 24.  Symptoms significantly worsened within the last 24 hours.  She was tested for influenza at her  pulmonologist office, which was negative.  She has no chest pain.  On exam, lungs are clear to auscultation bilaterally, but this may be limited secondary to body habitus.  Bilateral TMs are bulging.  Given her history of sarcoidosis, will check a chest x-ray, give the patient a nebulizer treatment, and check a point-of-care CBG.  If elevated, she may require further labs.  Patient care transferred to PA Cacc[place]ale at the end of my shift. Patient presentation, ED course, and plan of care discussed with review of all pertinent labs and imaging. Please see his/her note for further details regarding further ED course and disposition.  Final Clinical Impressions(s) / ED Diagnoses   Final diagnoses:  None    ED Discharge Orders    None       Joanne Gavel, PA-C 11/24/18 1761    Rolland Porter, MD 11/24/18 [phone]

## 2018-11-24 NOTE — Telephone Encounter (Signed)
ATC Patient.  Left message to call back. 

## 2018-11-24 NOTE — Telephone Encounter (Signed)
Ok. I am fine with signing that. Is the patient going to pick it up?  Aaron Edelman

## 2018-11-24 NOTE — Telephone Encounter (Signed)
Returned Ms Remick's phone call.  She is currently almost finished with her course of Doxycycline and Prednisone but states she seems to be feeling worse.  She's febrile and had episode of epistaxis and earache.  She claims its almost the same symptoms she had when she developed pneumonia November 2018.  Her flu test was negative.  Advised her to proceed to ER or urgent care if continues to feel bad and if not able to wait for the morning to get a clinic appointment.  She says she will likely go to [facility] ED.

## 2018-11-24 NOTE — ED Notes (Signed)
Pt reports having fevers at home of 100.4.

## 2018-11-24 NOTE — Telephone Encounter (Signed)
Patient retuning phone call.  States she needs a work note for return to work.  Patient phone number is [phone].

## 2018-11-24 NOTE — ED Provider Notes (Signed)
  Physical Exam  BP 135/89   Pulse 72   Temp 98.1 F (36.7 C) (Oral)   Resp 14   Ht 5\' 7"  (1.702 m)   Wt (!) 146.5 kg   LMP 07/17/2012   SpO2 99%   BMI 50.59 kg/m   Physical Exam   Gen: in NAD Pulm: speaking in full sentences. Clear lung sounds in all fields. No respiratory distress.  CV: RRR  ED Course/Procedures     Procedures  MDM  Patient signed out to me by Mervin Hack, PA-C.  Please see previous notes for further history.  In brief, patient with a history of sarcoidosis followed by pulmonology.  For the past week, she has been having URI symptoms including wheezing, fever up to 101, headache, bilateral ear pain.  She was started on doxycycline and a prednisone taper 7 days ago.  Patient reports she is not improving.  She has had a negative flu swab.  She saw pulmonology yesterday, had a reassuring work-up, but states she continues to feel poorly.  Has a history of pneumonia, is concerned she has pneumonia again.  Distally, patient with a history of diabetes, her blood sugars have been elevated due to prednisone, in the 200s or 300s.  Plan for chest x-ray and recheck of CBG.  She has been given a breathing treatment and cough syrup.  Chest x-ray viewed interpreted by me, no pneumonia, pneumothorax, effusion.  CBG reassuring at 128.  On reassessment after breathing treatment and cough syrup, patient reports symptoms are improving, but she still does not feel 100%.  Discussed typical course of viral illness, and that she would likely not feel better until the virus is completely cleared.  Discussed that at this time, I do not want to try new antibiotics, as her chest x-ray was negative and she is currently afebrile. Will give pt 2nd breathing tx for continued improvement. D/c with robitussin.  Patient has been taking Zyrtec, has not been using her Flonase due to nasal congestion.  Encourage Flonase use.  Encourage follow-up with pulmonology as needed if symptoms not improving.  At  this time, patient appears safe for discharge.  Return precautions given.  Patient states she understands and agrees to plan.           Franchot Heidelberg, PA-C 11/24/18 6728    Rolland Porter, MD 11/25/18 [phone]

## 2018-11-24 NOTE — ED Triage Notes (Addendum)
Pt c/o cough & fever, has seen pulmonologist recently for the cough, received prednisone & abx but the cough seems worse.  Pt stated "I was swabbed for the flu yesterday and it was (-)."

## 2018-11-24 NOTE — Telephone Encounter (Signed)
Work note signed by Wyn Quaker, NP.  Placed in sealed envelope for pick up.  Patient is aware. Nothing further at this time.

## 2018-11-24 NOTE — Telephone Encounter (Signed)
Spoke with Nicole Melendez. She is aware of her results. Nicole Melendez is requesting a work note to return to work on 11/28/2018.  Aaron Edelman - please advise. Thanks.

## 2018-12-11 ENCOUNTER — Other Ambulatory Visit: Payer: Self-pay | Admitting: Internal Medicine

## 2018-12-15 DIAGNOSIS — Z79891 Long term (current) use of opiate analgesic: Secondary | ICD-10-CM | POA: Diagnosis not present

## 2018-12-15 DIAGNOSIS — G894 Chronic pain syndrome: Secondary | ICD-10-CM | POA: Diagnosis not present

## 2018-12-15 DIAGNOSIS — M47817 Spondylosis without myelopathy or radiculopathy, lumbosacral region: Secondary | ICD-10-CM | POA: Diagnosis not present

## 2018-12-15 DIAGNOSIS — M069 Rheumatoid arthritis, unspecified: Secondary | ICD-10-CM | POA: Diagnosis not present

## 2018-12-15 DIAGNOSIS — M545 Low back pain: Secondary | ICD-10-CM | POA: Diagnosis not present

## 2018-12-15 DIAGNOSIS — Z79899 Other long term (current) drug therapy: Secondary | ICD-10-CM | POA: Diagnosis not present

## 2018-12-22 ENCOUNTER — Inpatient Hospital Stay: Admission: RE | Admit: 2018-12-22 | Payer: Self-pay | Source: Ambulatory Visit

## 2018-12-22 DIAGNOSIS — M79604 Pain in right leg: Secondary | ICD-10-CM | POA: Diagnosis not present

## 2018-12-22 DIAGNOSIS — M5116 Intervertebral disc disorders with radiculopathy, lumbar region: Secondary | ICD-10-CM | POA: Diagnosis not present

## 2018-12-22 DIAGNOSIS — M545 Low back pain: Secondary | ICD-10-CM | POA: Diagnosis not present

## 2018-12-29 ENCOUNTER — Ambulatory Visit (INDEPENDENT_AMBULATORY_CARE_PROVIDER_SITE_OTHER): Payer: BLUE CROSS/BLUE SHIELD | Admitting: Internal Medicine

## 2018-12-29 ENCOUNTER — Ambulatory Visit: Payer: Self-pay | Admitting: Emergency Medicine

## 2018-12-29 ENCOUNTER — Other Ambulatory Visit (INDEPENDENT_AMBULATORY_CARE_PROVIDER_SITE_OTHER): Payer: BLUE CROSS/BLUE SHIELD

## 2018-12-29 ENCOUNTER — Encounter: Payer: Self-pay | Admitting: Internal Medicine

## 2018-12-29 VITALS — BP 126/84 | HR 84 | Temp 97.9°F | Ht 67.0 in | Wt 336.0 lb

## 2018-12-29 DIAGNOSIS — Z794 Long term (current) use of insulin: Secondary | ICD-10-CM

## 2018-12-29 DIAGNOSIS — D509 Iron deficiency anemia, unspecified: Secondary | ICD-10-CM

## 2018-12-29 DIAGNOSIS — I5032 Chronic diastolic (congestive) heart failure: Secondary | ICD-10-CM

## 2018-12-29 DIAGNOSIS — Z0000 Encounter for general adult medical examination without abnormal findings: Secondary | ICD-10-CM | POA: Diagnosis not present

## 2018-12-29 DIAGNOSIS — E1140 Type 2 diabetes mellitus with diabetic neuropathy, unspecified: Secondary | ICD-10-CM | POA: Diagnosis not present

## 2018-12-29 DIAGNOSIS — M545 Low back pain: Secondary | ICD-10-CM | POA: Diagnosis not present

## 2018-12-29 DIAGNOSIS — R0683 Snoring: Secondary | ICD-10-CM | POA: Diagnosis not present

## 2018-12-29 DIAGNOSIS — M79604 Pain in right leg: Secondary | ICD-10-CM | POA: Diagnosis not present

## 2018-12-29 DIAGNOSIS — M5116 Intervertebral disc disorders with radiculopathy, lumbar region: Secondary | ICD-10-CM | POA: Diagnosis not present

## 2018-12-29 DIAGNOSIS — M5416 Radiculopathy, lumbar region: Secondary | ICD-10-CM

## 2018-12-29 LAB — LIPID PANEL
Cholesterol: 142 mg/dL (ref 0–200)
HDL: 51.1 mg/dL (ref >39.00)
LDL Cholesterol: 70 mg/dL (ref 0–99)
NonHDL: 91.29
Total CHOL/HDL Ratio: 3
Triglycerides: 104 mg/dL (ref 0.0–149.0)
VLDL: 20.8 mg/dL (ref 0.0–40.0)

## 2018-12-29 LAB — CBC WITH DIFFERENTIAL/PLATELET
Basophils Absolute: 0.1 10*3/uL (ref 0.0–0.1)
Basophils Relative: 1.3 % (ref 0.0–3.0)
Eosinophils Absolute: 0.2 10*3/uL (ref 0.0–0.7)
Eosinophils Relative: 3.7 % (ref 0.0–5.0)
HCT: 39.3 % (ref 36.0–46.0)
HEMOGLOBIN: 13.2 g/dL (ref 12.0–15.0)
Lymphocytes Relative: 26.9 % (ref 12.0–46.0)
Lymphs Abs: 1.7 10*3/uL (ref 0.7–4.0)
MCHC: 33.6 g/dL (ref 30.0–36.0)
MCV: 84.1 fl (ref 78.0–100.0)
Monocytes Absolute: 0.3 10*3/uL (ref 0.1–1.0)
Monocytes Relative: 5.2 % (ref 3.0–12.0)
Neutro Abs: 4 10*3/uL (ref 1.4–7.7)
Neutrophils Relative %: 62.9 % (ref 43.0–77.0)
Platelets: 284 10*3/uL (ref 150.0–400.0)
RBC: 4.67 Mil/uL (ref 3.87–5.11)
RDW: 14.1 % (ref 11.5–15.5)
WBC: 6.3 10*3/uL (ref 4.0–10.5)

## 2018-12-29 LAB — HEPATIC FUNCTION PANEL
ALT: 12 U/L (ref 0–35)
AST: 15 U/L (ref 0–37)
Albumin: 4.2 g/dL (ref 3.5–5.2)
Alkaline Phosphatase: 142 U/L — ABNORMAL HIGH (ref 39–117)
Bilirubin, Direct: 0.1 mg/dL (ref 0.0–0.3)
Total Bilirubin: 0.4 mg/dL (ref 0.2–1.2)
Total Protein: 7 g/dL (ref 6.0–8.3)

## 2018-12-29 LAB — BASIC METABOLIC PANEL
BUN: 11 mg/dL (ref 6–23)
CO2: 23 meq/L (ref 19–32)
Calcium: 9.4 mg/dL (ref 8.4–10.5)
Chloride: 107 mEq/L (ref 96–112)
Creatinine, Ser: 0.77 mg/dL (ref 0.40–1.20)
GFR: 98.1 mL/min (ref >60.00)
GLUCOSE: 95 mg/dL (ref 70–99)
Potassium: 4.1 mEq/L (ref 3.5–5.1)
Sodium: 141 mEq/L (ref 135–145)

## 2018-12-29 LAB — URINALYSIS, ROUTINE W REFLEX MICROSCOPIC
Bilirubin Urine: NEGATIVE
Hgb urine dipstick: NEGATIVE
Ketones, ur: NEGATIVE
Leukocytes,Ua: NEGATIVE
Nitrite: NEGATIVE
Specific Gravity, Urine: 1.02 (ref 1.000–1.030)
TOTAL PROTEIN, URINE-UPE24: NEGATIVE
UROBILINOGEN UA: 0.2 (ref 0.0–1.0)
Urine Glucose: NEGATIVE
pH: 6.5 (ref 5.0–8.0)

## 2018-12-29 LAB — MICROALBUMIN / CREATININE URINE RATIO
CREATININE, U: 88.6 mg/dL
MICROALB/CREAT RATIO: 0.8 mg/g (ref 0.0–30.0)
Microalb, Ur: <0.7 mg/dL (ref 0.0–1.9)

## 2018-12-29 LAB — FERRITIN: Ferritin: 32 ng/mL (ref 10.0–291.0)

## 2018-12-29 LAB — TSH: TSH: 1.09 u[IU]/mL (ref 0.35–4.50)

## 2018-12-29 LAB — IBC PANEL
Iron: 66 ug/dL (ref 42–145)
Saturation Ratios: 18.5 % — ABNORMAL LOW (ref 20.0–50.0)
Transferrin: 255 mg/dL (ref 212.0–360.0)

## 2018-12-29 LAB — HEMOGLOBIN A1C: Hgb A1c MFr Bld: 6.6 % — ABNORMAL HIGH (ref 4.6–6.5)

## 2018-12-29 NOTE — Progress Notes (Signed)
Subjective:    Patient ID: Nicole Melendez, female    DOB: 08-11-1976, 43 y.o.   MRN: [id number]  HPI  Here for wellness and f/u;  Overall doing ok;  Pt denies Chest pain, worsening SOB, DOE, wheezing, orthopnea, PND, worsening LE edema, palpitations, dizziness or syncope.  Pt denies neurological change such as new headache, facial or extremity weakness.  Pt denies polydipsia, polyuria, or low sugar symptoms. Pt states overall good compliance with treatment and medications, good tolerability, and has been trying to follow appropriate diet.  Pt denies worsening depressive symptoms, suicidal ideation or panic. No fever, night sweats, wt loss, loss of appetite, or other constitutional symptoms.  Pt states good ability with ADL's, has low fall risk, home safety reviewed and adequate, no other significant changes in hearing or vision, and not active with exercise.  Has ongoing iron deficiency with hx of anemia, s/p GI evaluation and TAH.   Has next optho for later this month.   Past Medical History:  Diagnosis Date  . Allergic rhinitis, cause unspecified   . Anemia   . Anxiety   . Depression   . Diabetes mellitus type II    steroid related, patient states "im no longer diabetic"  . Essential hypertension 08/08/2015  . GERD (gastroesophageal reflux disease)   . History of blood transfusion   . Hyperlipidemia   . Menorrhagia   . Migraine   . Morbid obesity ([place])   . Otitis media 06/28/2015  . Peptic ulcer disease   . Positive ANA (antinuclear antibody) 02/14/2012  . Sarcoid    including hand per rheumatology-Dr. Amil Amen  . Sarcoidosis of lung ([place])   . Shortness of breath    on exertion  . Varicose veins with pain    Past Surgical History:  Procedure Laterality Date  . ABDOMINAL HYSTERECTOMY    . COLONOSCOPY WITH PROPOFOL N/A 10/19/2016   Procedure: COLONOSCOPY WITH PROPOFOL;  Surgeon: Mauri Pole, MD;  Location: [facility];  Service: Endoscopy;  Laterality: N/A;  .  ESOPHAGOGASTRODUODENOSCOPY (EGD) WITH PROPOFOL N/A 10/19/2016   Procedure: ESOPHAGOGASTRODUODENOSCOPY (EGD) WITH PROPOFOL;  Surgeon: Mauri Pole, MD;  Location: [facility];  Service: Endoscopy;  Laterality: N/A;  . HERNIA MESH REMOVAL  02/2013  . uterine ablation  03/2010  . WISDOM TOOTH EXTRACTION      reports that she quit smoking about 6 years ago. Her smoking use included cigarettes. She has a 20.00 pack-year smoking history. She has never used smokeless tobacco. She reports that she does not drink alcohol or use drugs. family history includes Allergies in her mother; Bone cancer in her maternal aunt; Breast cancer in her maternal aunt; Diabetes in her father and mother; Heart attack in her mother; Heart disease in her father; Hypertension in an other family member; Lung cancer in her maternal aunt; Ovarian cancer in her maternal aunt; Rheum arthritis in her father; Stroke in her father and maternal uncle. Allergies  Allergen Reactions  . Azithromycin Hives    (z pak) hives  . Bee Venom Anaphylaxis  . Flagyl [Metronidazole] Hives and Shortness Of Breath  . Penicillins Shortness Of Breath and Swelling    Has patient had a PCN reaction causing immediate rash, facial/tongue/throat swelling, SOB or lightheadedness with hypotension: Yes Has patient had a PCN reaction causing severe rash involving mucus membranes or skin necrosis: No Has patient had a PCN reaction that required hospitalization No Has patient had a PCN reaction occurring within the last 10 years: No If  all of the above answers are "NO", then may proceed with Cephalosporin use.  REACTION: swelling and difficulty breathing  . Shellfish Allergy Anaphylaxis  . Klonopin [Clonazepam] Other (See Comments)    Memory difficulty   Current Outpatient Medications on File Prior to Visit  Medication Sig Dispense Refill  . acetaminophen (TYLENOL) 500 MG tablet Take 1 tablet (500 mg total) by mouth every 6 (six) hours as needed. 30  tablet 0  . albuterol (PROVENTIL HFA;VENTOLIN HFA) 108 (90 Base) MCG/ACT inhaler Inhale 2 puffs into the lungs every 4 (four) hours as needed for wheezing or shortness of breath. 1 Inhaler 2  . ALPRAZolam (XANAX) 0.5 MG tablet TAKE 1 TABLET(0.5 MG) BY MOUTH THREE TIMES DAILY AS NEEDED (Patient taking differently: Take 0.5 mg by mouth 3 (three) times daily as needed for anxiety. ) 90 tablet 2  . azelastine (ASTELIN) 0.1 % nasal spray Place 2 sprays into both nostrils 2 (two) times daily. Use in each nostril as directed 30 mL 12  . Blood Glucose Monitoring Suppl (ONE TOUCH ULTRA 2) W/DEVICE KIT Use as directed four times daily. Dx: E11.9 1 each 0  . budesonide-formoterol (SYMBICORT) 160-4.5 MCG/ACT inhaler Inhale 2 puffs into the lungs 2 (two) times daily. 1 Inhaler 5  . cetirizine (ZYRTEC) 10 MG tablet TAKE 1 TABLET(10 MG) BY MOUTH DAILY 90 tablet 0  . Diclofenac Sodium (PENNSAID) 2 % SOLN Apply 1 pump twice daily as needed. (Patient taking differently: Apply 1 pump twice daily as needed pain) 112 g 3  . fluticasone (FLONASE) 50 MCG/ACT nasal spray Place 2 sprays into both nostrils daily. 16 g 0  . furosemide (LASIX) 40 MG tablet 1 tab by mouth every day in the AM and then 1 as needed only for persistent swelling in the PM 60 tablet 11  . gabapentin (NEURONTIN) 300 MG capsule Take 2 capsules (600 mg total) by mouth 3 (three) times daily. 180 capsule 5  . glucose blood (ONE TOUCH ULTRA TEST) test strip Use to check blood sugars four times a day Dx E11.9 400 each 11  . hydrOXYzine (ATARAX/VISTARIL) 10 MG tablet TAKE 1 TABLET(10 MG) BY MOUTH THREE TIMES DAILY AS NEEDED (Patient taking differently: Take 10 mg by mouth 3 (three) times daily as needed for anxiety. ) 60 tablet 0  . insulin lispro (HUMALOG) 100 UNIT/ML injection INJECT UP TO 12 UNITS INTO THE SKIN THREE TIMES DAILY WITH MEALS WITH SLIDING SCALE AS DIRECTED 20 mL 11  . Insulin Syringe-Needle U-100 25G X 1" 1 ML MISC Use to administer humalog  insulin three times a day Dx E11.9 100 each 5  . levalbuterol (XOPENEX HFA) 45 MCG/ACT inhaler Inhale 1-2 puffs into the lungs every 8 (eight) hours as needed for wheezing or shortness of breath. USE 2 PUFFS 3 TIMES DAILY X 5 DAYS THEN BACK TO HOME REGIMEN AS NEEDED. 1 Inhaler 0  . lidocaine (LIDODERM) 5 % Place 1 patch onto the skin daily. Remove & Discard patch within 12 hours or as directed by MD 60 patch 2  . omeprazole (PRILOSEC) 40 MG capsule TAKE 1 CAPSULE(40 MG) BY MOUTH DAILY (Patient taking differently: Take 40 mg by mouth daily. ) 90 capsule 0  . ONE TOUCH ULTRA TEST test strip TEST BLOOD SUGAR FOUR TIMES DAILY 100 each 0  . oxyCODONE-acetaminophen (PERCOCET) 10-325 MG tablet Take 1 tablet by mouth every 8 (eight) hours as needed for pain.   0  . potassium chloride (K-DUR) 10 MEQ tablet 3 tab  by mouth per day only when taking the lasix 90 tablet 11  . rosuvastatin (CRESTOR) 40 MG tablet   3  . sertraline (ZOLOFT) 100 MG tablet Take 2 tablets (200 mg total) by mouth daily. 180 tablet 1  . SUMAtriptan (IMITREX) 100 MG tablet Take 1 tablet by mouth at the onset of a headache. May repeat in 2 hours if headache persists or recurs. 10 tablet 2  . telmisartan (MICARDIS) 40 MG tablet TAKE 1 TABLET(40 MG) BY MOUTH DAILY (Patient taking differently: Take 40 mg by mouth daily. ) 90 tablet 0  . TURMERIC PO Take 1 tablet by mouth daily.    . vitamin B-12 (CYANOCOBALAMIN) 1000 MCG tablet Take 1,000 mcg by mouth daily.    . Vitamin D, Ergocalciferol, (DRISDOL) 50000 units CAPS capsule TAKE 1 CAPSULE BY MOUTH EVERY 7 DAYS (Patient taking differently: Take 50,000 Units by mouth every 7 (seven) days. ) 12 capsule 0  . zonisamide (ZONEGRAN) 100 MG capsule TAKE 1 CAPSULE(100 MG) BY MOUTH DAILY 90 capsule 1   No current facility-administered medications on file prior to visit.    Review of Systems Constitutional: Negative for other unusual diaphoresis, sweats, appetite or weight changes HENT: Negative for  other worsening hearing loss, ear pain, facial swelling, mouth sores or neck stiffness.   Eyes: Negative for other worsening pain, redness or other visual disturbance.  Respiratory: Negative for other stridor or swelling Cardiovascular: Negative for other palpitations or other chest pain  Gastrointestinal: Negative for worsening diarrhea or loose stools, blood in stool, distention or other pain Genitourinary: Negative for hematuria, flank pain or other change in urine volume.  Musculoskeletal: Negative for myalgias or other joint swelling.  Skin: Negative for other color change, or other wound or worsening drainage.  Neurological: Negative for other syncope or numbness. Hematological: Negative for other adenopathy or swelling Psychiatric/Behavioral: Negative for hallucinations, other worsening agitation, SI, self-injury, or new decreased concentration All other system neg per pt    Objective:   Physical Exam BP 126/84   Pulse 84   Temp 97.9 F (36.6 C) (Oral)   Ht 5' 7" (1.702 m)   Wt (!) 336 lb (152.4 kg)   LMP 07/17/2012   SpO2 95%   BMI 52.63 kg/m  VS noted,  Constitutional: Pt is oriented to person, place, and time. Appears well-developed and well-nourished, in no significant distress and comfortable Head: Normocephalic and atraumatic  Eyes: Conjunctivae and EOM are normal. Pupils are equal, round, and reactive to light Right Ear: External ear normal without discharge Left Ear: External ear normal without discharge Nose: Nose without discharge or deformity Mouth/Throat: Oropharynx is without other ulcerations and moist  Neck: Normal range of motion. Neck supple. No JVD present. No tracheal deviation present or significant neck LA or mass Cardiovascular: Normal rate, regular rhythm, normal heart sounds and intact distal pulses.   Pulmonary/Chest: WOB normal and breath sounds without rales or wheezing  Abdominal: Soft. Bowel sounds are normal. NT. No HSM  Musculoskeletal:  Normal range of motion. Exhibits no edema Lymphadenopathy: Has no other cervical adenopathy.  Neurological: Pt is alert and oriented to person, place, and time. Pt has normal reflexes. No cranial nerve deficit. Motor grossly intact, Gait intact Skin: Skin is warm and dry. No rash noted or new ulcerations Psychiatric:  Has normal mood and affect. Behavior is normal without agitation No other exam findings Lab Results  Component Value Date   WBC 6.8 11/08/2017   HGB 12.7 11/08/2017   HCT  38.8 11/08/2017   PLT 303.0 11/08/2017   GLUCOSE 130 (H) 08/31/2018   CHOL 152 08/31/2018   TRIG 87.0 08/31/2018   HDL 52.80 08/31/2018   LDLDIRECT 135.0 07/05/2011   LDLCALC 82 08/31/2018   ALT 19 08/31/2018   AST 20 08/31/2018   NA 138 08/31/2018   K 4.1 08/31/2018   CL 105 08/31/2018   CREATININE 0.75 08/31/2018   BUN 8 08/31/2018   CO2 25 08/31/2018   TSH 1.59 06/15/2017   INR 1.3 11/17/2012   HGBA1C 6.3 08/31/2018   MICROALBUR 0.7 06/15/2017       Assessment & Plan:

## 2018-12-29 NOTE — Progress Notes (Signed)
   Subjective:    Patient ID: Nicole Melendez, female    DOB: 1976-04-17, 43 y.o.   MRN: [id number]  HPI   Due for CT chest high res mar 12.    Review of Systems     Objective:   Physical Exam        Assessment & Plan:

## 2018-12-29 NOTE — Assessment & Plan Note (Signed)
Persistent wax and wanes now for PT per ortho

## 2018-12-29 NOTE — Assessment & Plan Note (Signed)
stable overall by history and exam, recent data reviewed with pt, and pt to continue medical treatment as before,  to f/u any worsening symptoms or concerns, for f/u a1c 

## 2018-12-29 NOTE — Patient Instructions (Signed)

## 2018-12-29 NOTE — Telephone Encounter (Signed)
I called pt to reschedule her appt for her OV and PFT. Nothing further is needed.

## 2018-12-29 NOTE — Assessment & Plan Note (Signed)
stable overall by history and exam, recent data reviewed with pt, and pt to continue medical treatment as before,  to f/u any worsening symptoms or concerns  

## 2018-12-29 NOTE — Assessment & Plan Note (Addendum)
To consider heme evalution, f/u lab today

## 2018-12-29 NOTE — Assessment & Plan Note (Signed)

## 2018-12-29 NOTE — Assessment & Plan Note (Signed)
Has sleepiness but neg osa testing in past

## 2019-01-04 ENCOUNTER — Ambulatory Visit (INDEPENDENT_AMBULATORY_CARE_PROVIDER_SITE_OTHER)
Admission: RE | Admit: 2019-01-04 | Discharge: 2019-01-04 | Disposition: A | Discharge status: Home / Self Care | Payer: BLUE CROSS/BLUE SHIELD | Source: Ambulatory Visit | Attending: Emergency Medicine | Admitting: Emergency Medicine

## 2019-01-04 ENCOUNTER — Other Ambulatory Visit: Payer: Self-pay

## 2019-01-04 DIAGNOSIS — J8489 Other specified interstitial pulmonary diseases: Secondary | ICD-10-CM | POA: Diagnosis not present

## 2019-01-04 DIAGNOSIS — D869 Sarcoidosis, unspecified: Secondary | ICD-10-CM

## 2019-01-09 ENCOUNTER — Other Ambulatory Visit: Payer: Self-pay | Admitting: Internal Medicine

## 2019-01-12 DIAGNOSIS — M47817 Spondylosis without myelopathy or radiculopathy, lumbosacral region: Secondary | ICD-10-CM | POA: Diagnosis not present

## 2019-01-12 DIAGNOSIS — Z79899 Other long term (current) drug therapy: Secondary | ICD-10-CM | POA: Diagnosis not present

## 2019-01-12 DIAGNOSIS — G894 Chronic pain syndrome: Secondary | ICD-10-CM | POA: Diagnosis not present

## 2019-01-12 DIAGNOSIS — M069 Rheumatoid arthritis, unspecified: Secondary | ICD-10-CM | POA: Diagnosis not present

## 2019-01-12 DIAGNOSIS — Z79891 Long term (current) use of opiate analgesic: Secondary | ICD-10-CM | POA: Diagnosis not present

## 2019-01-12 DIAGNOSIS — D869 Sarcoidosis, unspecified: Secondary | ICD-10-CM | POA: Diagnosis not present

## 2019-01-16 ENCOUNTER — Encounter: Payer: Self-pay | Admitting: Internal Medicine

## 2019-01-16 ENCOUNTER — Ambulatory Visit (INDEPENDENT_AMBULATORY_CARE_PROVIDER_SITE_OTHER): Payer: BLUE CROSS/BLUE SHIELD | Admitting: Internal Medicine

## 2019-01-16 DIAGNOSIS — Z794 Long term (current) use of insulin: Secondary | ICD-10-CM

## 2019-01-16 DIAGNOSIS — R05 Cough: Secondary | ICD-10-CM | POA: Diagnosis not present

## 2019-01-16 DIAGNOSIS — J209 Acute bronchitis, unspecified: Secondary | ICD-10-CM | POA: Diagnosis not present

## 2019-01-16 DIAGNOSIS — R059 Cough, unspecified: Secondary | ICD-10-CM

## 2019-01-16 DIAGNOSIS — I5032 Chronic diastolic (congestive) heart failure: Secondary | ICD-10-CM

## 2019-01-16 DIAGNOSIS — E1140 Type 2 diabetes mellitus with diabetic neuropathy, unspecified: Secondary | ICD-10-CM

## 2019-01-16 MED ORDER — PROMETHAZINE-DM 6.25-15 MG/5ML PO SYRP: 5.0000 mL | ORAL_SOLUTION | Freq: Four times a day (QID) | ORAL | 0 refills | Status: DC | PRN

## 2019-01-16 MED ORDER — DOXYCYCLINE HYCLATE 100 MG PO TABS: 100.0000 mg | ORAL_TABLET | Freq: Two times a day (BID) | ORAL | 0 refills | Status: DC

## 2019-01-16 NOTE — Assessment & Plan Note (Signed)
Could be related to coronavirus or chronic lung problems and will treat accordingly.

## 2019-01-16 NOTE — Assessment & Plan Note (Signed)
Concerning symptoms and could be related to novel coronavirus. She does have consistent symptoms. Given that this has been 2-3 weeks and concurrent lung disease will rx doxycycline and promethazine/dm cough medicine. Will avoid steroids as no clear wheezing complaints and SOB is only mild. Advised if worsening SOB go to ER and if no improvement reach back out to the office.

## 2019-01-16 NOTE — Progress Notes (Signed)
Virtual Visit via Video Note  I connected with Nicole Melendez on 01/16/19 at 11:00 AM EDT by a video enabled telemedicine application and verified that I am speaking with the correct person using two identifiers.   I discussed the limitations of evaluation and management by telemedicine and the availability of in person appointments. The patient expressed understanding and agreed to proceed.  History of Present Illness: The patient is a 43 y.o. YO female with visit for wheezing. Has PMH of sarcoidosis and chronic bronchitis. Taking symbicort and flonase. Admits to using rescue inhaler more often lately. Started about 2-3 weeks ago after visit with PCP. She denies recent travel or large groups. Having intermittent fevers/chills. Her friend took temp a few days ago and was 99.7 F. Has cough, mild SOB. Previously was having sinus congestion and drainage. Denies SOB with exertion in the normal range. She is having to rest with walking to kitchen but not actually getting SOB. Is very fatigued and some body aches. Overall it is stable but not improving. It is not worsening. Has tried theraflu which relieved her sinus symptoms and mucinex which did not help at all. Cough medicine otc helps for a few hours. Has concurrent diabetes and last HgA1c 6.6  PMH reviewed and updated during visit. Allergies and medications were reviewed during visit.    Observations/Objective: Appearance: lying in bed during visit, breathing appears normal and there is no SOB with talking and able to speak many sentences without stopping for breath, loungewear grooming, abdomen does not appear distended, throat not examined, mental status is  A and O times 3  Assessment and Plan: See problem oriented charting   Follow Up Instructions: take antibiotic and cough medicine and if no improvement contact office, for worsening SOB seek care in ER immediately.   I discussed the assessment and treatment plan with the patient. The patient  was provided an opportunity to ask questions and all were answered. The patient agreed with the plan and demonstrated an understanding of the instructions.   The patient was advised to call back or seek an in-person evaluation if the symptoms worsen or if the condition fails to improve as anticipated.  I provided 25 minutes of non-face-to-face time during this encounter.  Hoyt Koch, MD

## 2019-01-16 NOTE — Assessment & Plan Note (Signed)
Has not missed meds and no worse SOB with lying flat. Weight is stable and no new swelling. Do not suspect flare caused cough or respiratory symptoms.

## 2019-01-16 NOTE — Assessment & Plan Note (Signed)
Will avoid steroids today given concurrent diabetes and there is no clear indication.

## 2019-01-23 ENCOUNTER — Telehealth: Payer: Self-pay | Admitting: Internal Medicine

## 2019-01-23 NOTE — Telephone Encounter (Signed)
Patient saw Dr.Crawford on 01/16/19. Dr.Crawford informed her to stay out work until sx are better. But Patient is saying she should not go back to work at all until the crisis is over, because she is at risk.  Dr.John Please advise if you are okay with that and willing to sign off on FMLA for it. Thank you.

## 2019-01-23 NOTE — Telephone Encounter (Signed)
[place] for [employer] to say out of work to Feb 26, 2019, after that we would have to reassess

## 2019-01-24 ENCOUNTER — Encounter: Payer: Self-pay | Admitting: Internal Medicine

## 2019-01-24 DIAGNOSIS — Z0279 Encounter for issue of other medical certificate: Secondary | ICD-10-CM

## 2019-01-24 NOTE — Telephone Encounter (Signed)
Forms have been signed, Faxed to [place], Copy sent to scan, &charged for.

## 2019-01-24 NOTE — Telephone Encounter (Signed)
Forms have been completed & placed in providers box to review and sign.  °

## 2019-01-24 NOTE — Telephone Encounter (Signed)
Patient informed and original mailed to her.

## 2019-01-26 ENCOUNTER — Ambulatory Visit: Payer: Self-pay | Admitting: Emergency Medicine

## 2019-02-16 DIAGNOSIS — G894 Chronic pain syndrome: Secondary | ICD-10-CM | POA: Diagnosis not present

## 2019-02-16 DIAGNOSIS — D869 Sarcoidosis, unspecified: Secondary | ICD-10-CM | POA: Diagnosis not present

## 2019-02-16 DIAGNOSIS — M069 Rheumatoid arthritis, unspecified: Secondary | ICD-10-CM | POA: Diagnosis not present

## 2019-02-16 DIAGNOSIS — Z79899 Other long term (current) drug therapy: Secondary | ICD-10-CM | POA: Diagnosis not present

## 2019-02-16 DIAGNOSIS — M47817 Spondylosis without myelopathy or radiculopathy, lumbosacral region: Secondary | ICD-10-CM | POA: Diagnosis not present

## 2019-02-16 DIAGNOSIS — Z79891 Long term (current) use of opiate analgesic: Secondary | ICD-10-CM | POA: Diagnosis not present

## 2019-02-27 ENCOUNTER — Encounter: Payer: Self-pay | Admitting: Internal Medicine

## 2019-02-27 NOTE — Telephone Encounter (Signed)
Pt needs a return to work letter and was suppose to return to work today at [employer] and wants to know if the letter can be uploaded to her mychart asap so she can show er employer and give Nicole Melendez a copy of it. / please advise

## 2019-02-27 NOTE — Telephone Encounter (Signed)
What kind of note, this thread seems to cover many different timelines?

## 2019-02-27 NOTE — Telephone Encounter (Signed)
pt is requesting a note for her visit on 01/16/19 with Dr Sharlet Salina.

## 2019-02-27 NOTE — Telephone Encounter (Signed)
A note that she was seen on 01/16/2019 and that she is able to go back to work since the symptoms are gone. Looks like she was told she could go back today by Dr. Jenny Reichmann.

## 2019-02-27 NOTE — Telephone Encounter (Signed)
Note done per visit 01/16/2019

## 2019-03-04 ENCOUNTER — Other Ambulatory Visit: Payer: Self-pay | Admitting: Internal Medicine

## 2019-03-04 ENCOUNTER — Other Ambulatory Visit: Payer: Self-pay | Admitting: Neurology

## 2019-03-05 ENCOUNTER — Other Ambulatory Visit: Payer: Self-pay | Admitting: *Deleted

## 2019-03-05 MED ORDER — CETIRIZINE HCL 10 MG PO TABS: ORAL_TABLET | ORAL | 1 refills | Status: DC

## 2019-03-13 ENCOUNTER — Other Ambulatory Visit: Payer: Self-pay

## 2019-03-13 ENCOUNTER — Encounter (HOSPITAL_COMMUNITY): Payer: Self-pay | Admitting: Emergency Medicine

## 2019-03-13 ENCOUNTER — Emergency Department (HOSPITAL_COMMUNITY)
Admission: EM | Admit: 2019-03-13 | Discharge: 2019-03-13 | Disposition: A | Discharge status: Home / Self Care | Payer: BLUE CROSS/BLUE SHIELD | Attending: Emergency Medicine | Admitting: Emergency Medicine

## 2019-03-13 ENCOUNTER — Ambulatory Visit (INDEPENDENT_AMBULATORY_CARE_PROVIDER_SITE_OTHER): Payer: BLUE CROSS/BLUE SHIELD | Admitting: Internal Medicine

## 2019-03-13 DIAGNOSIS — Z03818 Encounter for observation for suspected exposure to other biological agents ruled out: Secondary | ICD-10-CM | POA: Insufficient documentation

## 2019-03-13 DIAGNOSIS — I10 Essential (primary) hypertension: Secondary | ICD-10-CM

## 2019-03-13 DIAGNOSIS — E119 Type 2 diabetes mellitus without complications: Secondary | ICD-10-CM | POA: Diagnosis not present

## 2019-03-13 DIAGNOSIS — R509 Fever, unspecified: Secondary | ICD-10-CM | POA: Diagnosis not present

## 2019-03-13 DIAGNOSIS — Z20828 Contact with and (suspected) exposure to other viral communicable diseases: Secondary | ICD-10-CM | POA: Diagnosis not present

## 2019-03-13 DIAGNOSIS — E1140 Type 2 diabetes mellitus with diabetic neuropathy, unspecified: Secondary | ICD-10-CM | POA: Diagnosis not present

## 2019-03-13 DIAGNOSIS — R197 Diarrhea, unspecified: Secondary | ICD-10-CM

## 2019-03-13 DIAGNOSIS — Z794 Long term (current) use of insulin: Secondary | ICD-10-CM | POA: Diagnosis not present

## 2019-03-13 DIAGNOSIS — Z87891 Personal history of nicotine dependence: Secondary | ICD-10-CM | POA: Diagnosis not present

## 2019-03-13 DIAGNOSIS — J069 Acute upper respiratory infection, unspecified: Secondary | ICD-10-CM | POA: Diagnosis not present

## 2019-03-13 DIAGNOSIS — A09 Infectious gastroenteritis and colitis, unspecified: Secondary | ICD-10-CM | POA: Diagnosis not present

## 2019-03-13 LAB — SARS CORONAVIRUS 2 BY RT PCR (HOSPITAL ORDER, PERFORMED IN ~~LOC~~ HOSPITAL LAB): SARS Coronavirus 2: NEGATIVE

## 2019-03-13 NOTE — ED Triage Notes (Signed)
Pt reports that she had diarrhea, fevers, body aches since last Thursday and cough since March. Traveled to [place] this past weekend and was referred to ED for Covid testing by her PCP.

## 2019-03-13 NOTE — Discharge Instructions (Addendum)
You were evaluated in the Emergency Department and after careful evaluation, we did not find any emergent condition requiring admission or further testing in the hospital.  Your symptoms today seem to be due to a viral illness.  We are swabbing you for the coronavirus, please use my chart to review your results as we discussed.  If negative, you should discuss testing for C. difficile with your primary care doctor if your diarrhea continues.  Please return to the Emergency Department if you experience any worsening of your condition.  We encourage you to follow up with a primary care provider.  Thank you for allowing [place] to be a part of your care.

## 2019-03-13 NOTE — Patient Instructions (Signed)
Please go to the Emergency Room at [facility] to rule out pneumonia vs COVID19  Please continue all other medications as before, and refills have been done if requested.  Please have the pharmacy call with any other refills you may need.  Please continue your efforts at being more active, low cholesterol diet, and weight control.  Please keep your appointments with your specialists as you may have planned

## 2019-03-13 NOTE — ED Provider Notes (Signed)
[facility] Emergency Department Provider Note MRN:  [id number]  Arrival date & time: 03/13/19     Chief Complaint   Diarrhea; Fatigue; Sore Throat; and Generalized Body Aches   History of Present Illness   Nicole Melendez is a 43 y.o. year-old female with a history of sarcoidosis, diabetes presenting to the ED with chief complaint of diarrhea.  Patient explains that her symptoms began back in March.  She has been traveling from [place].  Low-grade fever, watery diarrhea, cough.  Symptoms constant, mild, no exacerbating relieving factors.  Received antibiotics back in March, diarrhea began 3 to 4 weeks after that.  Denies chest pain, no shortness of breath, no abdominal pain, no dysuria.  Review of Systems  A complete 10 system review of systems was obtained and all systems are negative except as noted in the HPI and PMH.   Patient's Health History    Past Medical History:  Diagnosis Date  . Allergic rhinitis, cause unspecified   . Anemia   . Anxiety   . Depression   . Diabetes mellitus type II    steroid related, patient states "im no longer diabetic"  . Essential hypertension 08/08/2015  . GERD (gastroesophageal reflux disease)   . History of blood transfusion   . Hyperlipidemia   . Menorrhagia   . Migraine   . Morbid obesity ([place])   . Otitis media 06/28/2015  . Peptic ulcer disease   . Positive ANA (antinuclear antibody) 02/14/2012  . Sarcoid    including hand per rheumatology-Dr. Amil Amen  . Sarcoidosis of lung ([place])   . Shortness of breath    on exertion  . Varicose veins with pain     Past Surgical History:  Procedure Laterality Date  . ABDOMINAL HYSTERECTOMY    . COLONOSCOPY WITH PROPOFOL N/A 10/19/2016   Procedure: COLONOSCOPY WITH PROPOFOL;  Surgeon: Mauri Pole, MD;  Location: [facility];  Service: Endoscopy;  Laterality: N/A;  . ESOPHAGOGASTRODUODENOSCOPY (EGD) WITH PROPOFOL N/A 10/19/2016   Procedure: ESOPHAGOGASTRODUODENOSCOPY (EGD)  WITH PROPOFOL;  Surgeon: Mauri Pole, MD;  Location: [facility];  Service: Endoscopy;  Laterality: N/A;  . HERNIA MESH REMOVAL  02/2013  . uterine ablation  03/2010  . WISDOM TOOTH EXTRACTION      Family History  Problem Relation Age of Onset  . Allergies Mother   . Heart attack Mother   . Diabetes Mother   . Diabetes Father   . Heart disease Father   . Rheum arthritis Father   . Stroke Father   . Hypertension Other   . Ovarian cancer Maternal Aunt   . Lung cancer Maternal Aunt   . Breast cancer Maternal Aunt   . Bone cancer Maternal Aunt   . Stroke Maternal Uncle   . Other Neg Hx     Social History   Socioeconomic History  . Marital status: Single    Spouse name: Not on file  . Number of children: 0  . Years of education: Not on file  . Highest education level: Not on file  Occupational History  . Occupation: Therapist, art  Social Needs  . Financial resource strain: Not on file  . Food insecurity:    Worry: Not on file    Inability: Not on file  . Transportation needs:    Medical: Not on file    Non-medical: Not on file  Tobacco Use  . Smoking status: Former Smoker    Packs/day: 1.00    Years: 20.00  Pack years: 20.00    Types: Cigarettes    Last attempt to quit: 10/25/2012    Years since quitting: 6.3  . Smokeless tobacco: Never Used  . Tobacco comment: QUIT 04/2010 AND STARTED BACK 2014 X 3 MONTHS. less than 1 ppd.  started at age 58.    Substance and Sexual Activity  . Alcohol use: No  . Drug use: No  . Sexual activity: Yes    Birth control/protection: None  Lifestyle  . Physical activity:    Days per week: Not on file    Minutes per session: Not on file  . Stress: Not on file  Relationships  . Social connections:    Talks on phone: Not on file    Gets together: Not on file    Attends religious service: Not on file    Active member of club or organization: Not on file    Attends meetings of clubs or organizations: Not on file     Relationship status: Not on file  . Intimate partner violence:    Fear of current or ex partner: Not on file    Emotionally abused: Not on file    Physically abused: Not on file    Forced sexual activity: Not on file  Other Topics Concern  . Not on file  Social History Narrative   Pt lives with [name]- also pt of [facility]     Physical Exam  Vital Signs and Nursing Notes reviewed Vitals:   03/13/19 1533  BP: (!) 168/84  Pulse: 91  Resp: 18  Temp: 98.1 F (36.7 C)  SpO2: 99%    CONSTITUTIONAL: Well-appearing, NAD, obese NEURO:  Alert and oriented x 3, no focal deficits EYES:  eyes equal and reactive ENT/NECK:  no LAD, no JVD CARDIO: Regular rate, well-perfused, normal S1 and S2 PULM:  CTAB no wheezing or rhonchi  GI/GU:  normal bowel sounds, non-distended, non-tender MSK/SPINE:  No gross deformities, no edema SKIN:  no rash, atraumatic PSYCH:  Appropriate speech and behavior  Diagnostic and Interventional Summary    Labs Reviewed  SARS CORONAVIRUS 2 (HOSPITAL ORDER, [place] [facility])    No orders to display    Medications - No data to display   Procedures Critical Care  ED Course and Medical Decision Making  I have reviewed the triage vital signs and the nursing notes.  Pertinent labs & imaging results that were available during my care of the patient were reviewed by me and considered in my medical decision making (see below for details).  Possible coronavirus in this 43 year old female with cough, fever, diarrhea.  Also considering C. difficile given her diarrhea occurring after a prescription for antibiotics.  Patient has normal vital signs, benign abdomen, nothing to suggest toxic megacolon or acute emergency today.  Patient requesting to be discharged right after coronavirus testing so that she can see her results on my chart.  Advised that she follow-up with PCP if the results are negative and discuss C. difficile testing.  After the  discussed management above, the patient was determined to be safe for discharge.  The patient was in agreement with this plan and all questions regarding their care were answered.  ED return precautions were discussed and the patient will return to the ED with any significant worsening of condition.  Barth Kirks. [place] Small, [place] [email or web address]  Final Clinical Impressions(s) / ED Diagnoses     ICD-10-CM   1.  Diarrhea of presumed infectious origin R19.7     ED Discharge Orders    None         Maudie Flakes, MD 03/13/19 [id number]

## 2019-03-13 NOTE — Progress Notes (Signed)
Patient ID: Nicole Melendez, female   DOB: 04-14-1976, 43 y.o.   MRN: [id number]  Virtual Visit via Video Note  I connected with Nicole Melendez on 03/18/19 at 10:20 AM EDT by a video enabled telemedicine application and verified that I am speaking with the correct person using two identifiers.  Location: Patient: at home Provider: at office   I discussed the limitations of evaluation and management by telemedicine and the availability of in person appointments. The patient expressed understanding and agreed to proceed.  History of Present Illness: 43yo F with c/o acute onset URI symptoms including HA, fever, ST, cough and mild sob x 3 days, general aches and chills, and even diarrhea for several days.  Recently out of self isolation at home, no known [place] contacts.  No other known sick contacts.  Pt denies chest pain, wheezing, orthopnea, PND, increased LE swelling, palpitations, dizziness or syncope.  Pt denies new neurological symptoms such as new headache, or facial or extremity weakness or numbness   Pt denies polydipsia, polyuria Past Medical History:  Diagnosis Date  . Allergic rhinitis, cause unspecified   . Anemia   . Anxiety   . Depression   . Diabetes mellitus type II    steroid related, patient states "im no longer diabetic"  . Essential hypertension 08/08/2015  . GERD (gastroesophageal reflux disease)   . History of blood transfusion   . Hyperlipidemia   . Menorrhagia   . Migraine   . Morbid obesity ([place])   . Otitis media 06/28/2015  . Peptic ulcer disease   . Positive ANA (antinuclear antibody) 02/14/2012  . Sarcoid    including hand per rheumatology-Dr. Amil Amen  . Sarcoidosis of lung ([place])   . Shortness of breath    on exertion  . Varicose veins with pain    Past Surgical History:  Procedure Laterality Date  . ABDOMINAL HYSTERECTOMY    . COLONOSCOPY WITH PROPOFOL N/A 10/19/2016   Procedure: COLONOSCOPY WITH PROPOFOL;  Surgeon: Mauri Pole, MD;  Location:  [facility];  Service: Endoscopy;  Laterality: N/A;  . ESOPHAGOGASTRODUODENOSCOPY (EGD) WITH PROPOFOL N/A 10/19/2016   Procedure: ESOPHAGOGASTRODUODENOSCOPY (EGD) WITH PROPOFOL;  Surgeon: Mauri Pole, MD;  Location: [facility];  Service: Endoscopy;  Laterality: N/A;  . HERNIA MESH REMOVAL  02/2013  . uterine ablation  03/2010  . WISDOM TOOTH EXTRACTION      reports that she quit smoking about 6 years ago. Her smoking use included cigarettes. She has a 20.00 pack-year smoking history. She has never used smokeless tobacco. She reports that she does not drink alcohol or use drugs. family history includes Allergies in her mother; Bone cancer in her maternal aunt; Breast cancer in her maternal aunt; Diabetes in her father and mother; Heart attack in her mother; Heart disease in her father; Hypertension in an other family member; Lung cancer in her maternal aunt; Ovarian cancer in her maternal aunt; Rheum arthritis in her father; Stroke in her father and maternal uncle. Allergies  Allergen Reactions  . Azithromycin Hives    (z pak) hives  . Bee Venom Anaphylaxis  . Flagyl [Metronidazole] Hives and Shortness Of Breath  . Penicillins Shortness Of Breath and Swelling    Has patient had a PCN reaction causing immediate rash, facial/tongue/throat swelling, SOB or lightheadedness with hypotension: Yes Has patient had a PCN reaction causing severe rash involving mucus membranes or skin necrosis: No Has patient had a PCN reaction that required hospitalization No Has patient had a PCN  reaction occurring within the last 10 years: No If all of the above answers are "NO", then may proceed with Cephalosporin use.  REACTION: swelling and difficulty breathing  . Shellfish Allergy Anaphylaxis  . Klonopin [Clonazepam] Other (See Comments)    Memory difficulty   Current Outpatient Medications on File Prior to Visit  Medication Sig Dispense Refill  . acetaminophen (TYLENOL) 500 MG tablet Take 1 tablet  (500 mg total) by mouth every 6 (six) hours as needed. 30 tablet 0  . albuterol (PROVENTIL HFA;VENTOLIN HFA) 108 (90 Base) MCG/ACT inhaler Inhale 2 puffs into the lungs every 4 (four) hours as needed for wheezing or shortness of breath. 1 Inhaler 2  . ALPRAZolam (XANAX) 0.5 MG tablet TAKE 1 TABLET(0.5 MG) BY MOUTH THREE TIMES DAILY AS NEEDED (Patient taking differently: Take 0.5 mg by mouth 3 (three) times daily as needed for anxiety. ) 90 tablet 2  . azelastine (ASTELIN) 0.1 % nasal spray Place 2 sprays into both nostrils 2 (two) times daily. Use in each nostril as directed 30 mL 12  . Blood Glucose Monitoring Suppl (ONE TOUCH ULTRA 2) W/DEVICE KIT Use as directed four times daily. Dx: E11.9 1 each 0  . budesonide-formoterol (SYMBICORT) 160-4.5 MCG/ACT inhaler Inhale 2 puffs into the lungs 2 (two) times daily. 1 Inhaler 5  . cetirizine (ZYRTEC) 10 MG tablet TAKE 1 TABLET(10 MG) BY MOUTH DAILY 90 tablet 1  . Diclofenac Sodium (PENNSAID) 2 % SOLN Apply 1 pump twice daily as needed. (Patient taking differently: Apply 1 pump twice daily as needed pain) 112 g 3  . doxycycline (VIBRA-TABS) 100 MG tablet Take 1 tablet (100 mg total) by mouth 2 (two) times daily. 14 tablet 0  . fluticasone (FLONASE) 50 MCG/ACT nasal spray Place 2 sprays into both nostrils daily. 16 g 0  . furosemide (LASIX) 40 MG tablet 1 tab by mouth every day in the AM and then 1 as needed only for persistent swelling in the PM 60 tablet 11  . gabapentin (NEURONTIN) 300 MG capsule TAKE 2 CAPSULES(600 MG) BY MOUTH THREE TIMES DAILY 180 capsule 5  . glucose blood (ONE TOUCH ULTRA TEST) test strip Use to check blood sugars four times a day Dx E11.9 400 each 11  . hydrOXYzine (ATARAX/VISTARIL) 10 MG tablet TAKE 1 TABLET(10 MG) BY MOUTH THREE TIMES DAILY AS NEEDED (Patient taking differently: Take 10 mg by mouth 3 (three) times daily as needed for anxiety. ) 60 tablet 0  . insulin lispro (HUMALOG) 100 UNIT/ML injection INJECT UP TO 12 UNITS INTO  THE SKIN THREE TIMES DAILY WITH MEALS WITH SLIDING SCALE AS DIRECTED 20 mL 11  . Insulin Syringe-Needle U-100 25G X 1" 1 ML MISC Use to administer humalog insulin three times a day Dx E11.9 100 each 5  . levalbuterol (XOPENEX HFA) 45 MCG/ACT inhaler Inhale 1-2 puffs into the lungs every 8 (eight) hours as needed for wheezing or shortness of breath. USE 2 PUFFS 3 TIMES DAILY X 5 DAYS THEN BACK TO HOME REGIMEN AS NEEDED. 1 Inhaler 0  . lidocaine (LIDODERM) 5 % Place 1 patch onto the skin daily. Remove & Discard patch within 12 hours or as directed by MD 60 patch 2  . omeprazole (PRILOSEC) 40 MG capsule TAKE 1 CAPSULE(40 MG) BY MOUTH DAILY 90 capsule 1  . ONE TOUCH ULTRA TEST test strip TEST BLOOD SUGAR FOUR TIMES DAILY 100 each 0  . oxyCODONE-acetaminophen (PERCOCET) 10-325 MG tablet Take 1 tablet by mouth every 8 (eight)  hours as needed for pain.   0  . potassium chloride (K-DUR) 10 MEQ tablet 3 tab by mouth per day only when taking the lasix 90 tablet 11  . promethazine-dextromethorphan (PROMETHAZINE-DM) 6.25-15 MG/5ML syrup Take 5 mLs by mouth 4 (four) times daily as needed for cough. 100 mL 0  . rosuvastatin (CRESTOR) 40 MG tablet TAKE 1 TABLET(40 MG) BY MOUTH DAILY 90 tablet 1  . sertraline (ZOLOFT) 100 MG tablet TAKE 2 TABLETS(200 MG) BY MOUTH DAILY 180 tablet 1  . SUMAtriptan (IMITREX) 100 MG tablet Take 1 tablet by mouth at the onset of a headache. May repeat in 2 hours if headache persists or recurs. 10 tablet 2  . telmisartan (MICARDIS) 40 MG tablet TAKE 1 TABLET(40 MG) BY MOUTH DAILY (Patient taking differently: Take 40 mg by mouth daily. ) 90 tablet 0  . TURMERIC PO Take 1 tablet by mouth daily.    . vitamin B-12 (CYANOCOBALAMIN) 1000 MCG tablet Take 1,000 mcg by mouth daily.    . Vitamin D, Ergocalciferol, (DRISDOL) 50000 units CAPS capsule TAKE 1 CAPSULE BY MOUTH EVERY 7 DAYS (Patient taking differently: Take 50,000 Units by mouth every 7 (seven) days. ) 12 capsule 0  . zonisamide  (ZONEGRAN) 100 MG capsule TAKE 1 CAPSULE(100 MG) BY MOUTH DAILY 90 capsule 1   No current facility-administered medications on file prior to visit.     Observations/Objective: Alert, NAD, mild ill and fatigued appearing, o/w appropriate mood and affect, resps normal, cn 2-12 intact, moves all 4s, no visible rash or swelling Lab Results  Component Value Date   WBC 6.3 12/29/2018   HGB 13.2 12/29/2018   HCT 39.3 12/29/2018   PLT 284.0 12/29/2018   GLUCOSE 95 12/29/2018   CHOL 142 12/29/2018   TRIG 104.0 12/29/2018   HDL 51.10 12/29/2018   LDLDIRECT 135.0 07/05/2011   LDLCALC 70 12/29/2018   ALT 12 12/29/2018   AST 15 12/29/2018   NA 141 12/29/2018   K 4.1 12/29/2018   CL 107 12/29/2018   CREATININE 0.77 12/29/2018   BUN 11 12/29/2018   CO2 23 12/29/2018   TSH 1.09 12/29/2018   INR 1.3 11/17/2012   HGBA1C 6.6 (H) 12/29/2018   MICROALBUR <0.7 12/29/2018   Assessment and Plan: See notes  Follow Up Instructions: See notes   I discussed the assessment and treatment plan with the patient. The patient was provided an opportunity to ask questions and all were answered. The patient agreed with the plan and demonstrated an understanding of the instructions.   The patient was advised to call back or seek an in-person evaluation if the symptoms worsen or if the condition fails to improve as anticipated.   Cathlean Cower, MD

## 2019-03-14 ENCOUNTER — Encounter: Payer: Self-pay | Admitting: Internal Medicine

## 2019-03-14 DIAGNOSIS — M47817 Spondylosis without myelopathy or radiculopathy, lumbosacral region: Secondary | ICD-10-CM | POA: Diagnosis not present

## 2019-03-18 ENCOUNTER — Encounter: Payer: Self-pay | Admitting: Internal Medicine

## 2019-03-18 NOTE — Assessment & Plan Note (Signed)
At least mild to mod symptomatic, likely viral, cannot r/o [phone] - for referral to ED now

## 2019-03-18 NOTE — Assessment & Plan Note (Signed)
stable overall by history and exam, recent data reviewed with pt, and pt to continue medical treatment as before,  to f/u any worsening symptoms or concerns  

## 2019-03-18 NOTE — Assessment & Plan Note (Signed)
Pt encouraged to monitor BP on regular basis, with goal < 140/90

## 2019-04-05 ENCOUNTER — Encounter: Payer: Self-pay | Admitting: Internal Medicine

## 2019-04-05 ENCOUNTER — Telehealth: Payer: Self-pay

## 2019-04-05 ENCOUNTER — Ambulatory Visit (INDEPENDENT_AMBULATORY_CARE_PROVIDER_SITE_OTHER): Payer: BC Managed Care – PPO | Admitting: Internal Medicine

## 2019-04-05 DIAGNOSIS — R197 Diarrhea, unspecified: Secondary | ICD-10-CM | POA: Diagnosis not present

## 2019-04-05 DIAGNOSIS — K921 Melena: Secondary | ICD-10-CM

## 2019-04-05 DIAGNOSIS — E1140 Type 2 diabetes mellitus with diabetic neuropathy, unspecified: Secondary | ICD-10-CM | POA: Diagnosis not present

## 2019-04-05 DIAGNOSIS — Z794 Long term (current) use of insulin: Secondary | ICD-10-CM

## 2019-04-05 DIAGNOSIS — R109 Unspecified abdominal pain: Secondary | ICD-10-CM | POA: Diagnosis not present

## 2019-04-05 MED ORDER — DIPHENOXYLATE-ATROPINE 2.5-0.025 MG PO TABS: 1.0000 | ORAL_TABLET | Freq: Four times a day (QID) | ORAL | 0 refills | Status: DC | PRN

## 2019-04-05 NOTE — Progress Notes (Signed)
Patient ID: Nicole Melendez, female   DOB: [birth year], 43 y.o.   MRN: [id number]  Virtual Visit via Video Note  I connected with Nicole Melendez on 04/05/19 at 10:00 AM EDT by a video enabled telemedicine application and verified that I am speaking with the correct person using two identifiers.  Location: Patient: at home Provider: at office   I discussed the limitations of evaluation and management by telemedicine and the availability of in person appointments. The patient expressed understanding and agreed to proceed.  History of Present Illness: Here to f/u with c/o diarrheal illness with gen'd discomfort mild to mod x 3 days associated with small BRBPR as well.  Has some nausea, temp up to 101, but no vomiting.  Last antibiotic was mar 2020.  Has had some intermittent diarrhea since then but not really bad until last few days.  Pt denies chest pain, increased sob or doe, wheezing, orthopnea, PND, increased LE swelling, palpitations, dizziness or syncope.  Pt denies new neurological symptoms such as new headache, or facial or extremity weakness or numbness   Pt denies polydipsia, polyuria Past Medical History:  Diagnosis Date  . Allergic rhinitis, cause unspecified   . Anemia   . Anxiety   . Depression   . Diabetes mellitus type II    steroid related, patient states "im no longer diabetic"  . Essential hypertension 08/08/2015  . GERD (gastroesophageal reflux disease)   . History of blood transfusion   . Hyperlipidemia   . Menorrhagia   . Migraine   . Morbid obesity ([place])   . Otitis media 06/28/2015  . Peptic ulcer disease   . Positive ANA (antinuclear antibody) 02/14/2012  . Sarcoid    including hand per rheumatology-Dr. Amil Amen  . Sarcoidosis of lung ([place])   . Shortness of breath    on exertion  . Varicose veins with pain    Past Surgical History:  Procedure Laterality Date  . ABDOMINAL HYSTERECTOMY    . COLONOSCOPY WITH PROPOFOL N/A 10/19/2016   Procedure: COLONOSCOPY WITH  PROPOFOL;  Surgeon: Mauri Pole, MD;  Location: [facility];  Service: Endoscopy;  Laterality: N/A;  . ESOPHAGOGASTRODUODENOSCOPY (EGD) WITH PROPOFOL N/A 10/19/2016   Procedure: ESOPHAGOGASTRODUODENOSCOPY (EGD) WITH PROPOFOL;  Surgeon: Mauri Pole, MD;  Location: [facility];  Service: Endoscopy;  Laterality: N/A;  . HERNIA MESH REMOVAL  02/2013  . uterine ablation  03/2010  . WISDOM TOOTH EXTRACTION      reports that she quit smoking about 6 years ago. Her smoking use included cigarettes. She has a 20.00 pack-year smoking history. She has never used smokeless tobacco. She reports that she does not drink alcohol or use drugs. family history includes Allergies in her mother; Bone cancer in her maternal aunt; Breast cancer in her maternal aunt; Diabetes in her father and mother; Heart attack in her mother; Heart disease in her father; Hypertension in an other family member; Lung cancer in her maternal aunt; Ovarian cancer in her maternal aunt; Rheum arthritis in her father; Stroke in her father and maternal uncle. Allergies  Allergen Reactions  . Azithromycin Hives    (z pak) hives  . Bee Venom Anaphylaxis  . Flagyl [Metronidazole] Hives and Shortness Of Breath  . Penicillins Shortness Of Breath and Swelling    Has patient had a PCN reaction causing immediate rash, facial/tongue/throat swelling, SOB or lightheadedness with hypotension: Yes Has patient had a PCN reaction causing severe rash involving mucus membranes or skin necrosis: No Has patient had  a PCN reaction that required hospitalization No Has patient had a PCN reaction occurring within the last 10 years: No If all of the above answers are "NO", then may proceed with Cephalosporin use.  REACTION: swelling and difficulty breathing  . Shellfish Allergy Anaphylaxis  . Klonopin [Clonazepam] Other (See Comments)    Memory difficulty   Current Outpatient Medications on File Prior to Visit  Medication Sig Dispense Refill   . acetaminophen (TYLENOL) 500 MG tablet Take 1 tablet (500 mg total) by mouth every 6 (six) hours as needed. 30 tablet 0  . albuterol (PROVENTIL HFA;VENTOLIN HFA) 108 (90 Base) MCG/ACT inhaler Inhale 2 puffs into the lungs every 4 (four) hours as needed for wheezing or shortness of breath. 1 Inhaler 2  . ALPRAZolam (XANAX) 0.5 MG tablet TAKE 1 TABLET(0.5 MG) BY MOUTH THREE TIMES DAILY AS NEEDED (Patient taking differently: Take 0.5 mg by mouth 3 (three) times daily as needed for anxiety. ) 90 tablet 2  . azelastine (ASTELIN) 0.1 % nasal spray Place 2 sprays into both nostrils 2 (two) times daily. Use in each nostril as directed 30 mL 12  . Blood Glucose Monitoring Suppl (ONE TOUCH ULTRA 2) W/DEVICE KIT Use as directed four times daily. Dx: E11.9 1 each 0  . budesonide-formoterol (SYMBICORT) 160-4.5 MCG/ACT inhaler Inhale 2 puffs into the lungs 2 (two) times daily. 1 Inhaler 5  . cetirizine (ZYRTEC) 10 MG tablet TAKE 1 TABLET(10 MG) BY MOUTH DAILY 90 tablet 1  . Diclofenac Sodium (PENNSAID) 2 % SOLN Apply 1 pump twice daily as needed. (Patient taking differently: Apply 1 pump twice daily as needed pain) 112 g 3  . doxycycline (VIBRA-TABS) 100 MG tablet Take 1 tablet (100 mg total) by mouth 2 (two) times daily. 14 tablet 0  . fluticasone (FLONASE) 50 MCG/ACT nasal spray Place 2 sprays into both nostrils daily. 16 g 0  . furosemide (LASIX) 40 MG tablet 1 tab by mouth every day in the AM and then 1 as needed only for persistent swelling in the PM 60 tablet 11  . gabapentin (NEURONTIN) 300 MG capsule TAKE 2 CAPSULES(600 MG) BY MOUTH THREE TIMES DAILY 180 capsule 5  . glucose blood (ONE TOUCH ULTRA TEST) test strip Use to check blood sugars four times a day Dx E11.9 400 each 11  . hydrOXYzine (ATARAX/VISTARIL) 10 MG tablet TAKE 1 TABLET(10 MG) BY MOUTH THREE TIMES DAILY AS NEEDED (Patient taking differently: Take 10 mg by mouth 3 (three) times daily as needed for anxiety. ) 60 tablet 0  . insulin lispro  (HUMALOG) 100 UNIT/ML injection INJECT UP TO 12 UNITS INTO THE SKIN THREE TIMES DAILY WITH MEALS WITH SLIDING SCALE AS DIRECTED 20 mL 11  . Insulin Syringe-Needle U-100 25G X 1" 1 ML MISC Use to administer humalog insulin three times a day Dx E11.9 100 each 5  . levalbuterol (XOPENEX HFA) 45 MCG/ACT inhaler Inhale 1-2 puffs into the lungs every 8 (eight) hours as needed for wheezing or shortness of breath. USE 2 PUFFS 3 TIMES DAILY X 5 DAYS THEN BACK TO HOME REGIMEN AS NEEDED. 1 Inhaler 0  . lidocaine (LIDODERM) 5 % Place 1 patch onto the skin daily. Remove & Discard patch within 12 hours or as directed by MD 60 patch 2  . omeprazole (PRILOSEC) 40 MG capsule TAKE 1 CAPSULE(40 MG) BY MOUTH DAILY 90 capsule 1  . ONE TOUCH ULTRA TEST test strip TEST BLOOD SUGAR FOUR TIMES DAILY 100 each 0  . oxyCODONE-acetaminophen (  PERCOCET) 10-325 MG tablet Take 1 tablet by mouth every 8 (eight) hours as needed for pain.   0  . potassium chloride (K-DUR) 10 MEQ tablet 3 tab by mouth per day only when taking the lasix 90 tablet 11  . promethazine-dextromethorphan (PROMETHAZINE-DM) 6.25-15 MG/5ML syrup Take 5 mLs by mouth 4 (four) times daily as needed for cough. 100 mL 0  . rosuvastatin (CRESTOR) 40 MG tablet TAKE 1 TABLET(40 MG) BY MOUTH DAILY 90 tablet 1  . sertraline (ZOLOFT) 100 MG tablet TAKE 2 TABLETS(200 MG) BY MOUTH DAILY 180 tablet 1  . SUMAtriptan (IMITREX) 100 MG tablet Take 1 tablet by mouth at the onset of a headache. May repeat in 2 hours if headache persists or recurs. 10 tablet 2  . telmisartan (MICARDIS) 40 MG tablet TAKE 1 TABLET(40 MG) BY MOUTH DAILY (Patient taking differently: Take 40 mg by mouth daily. ) 90 tablet 0  . TURMERIC PO Take 1 tablet by mouth daily.    . vitamin B-12 (CYANOCOBALAMIN) 1000 MCG tablet Take 1,000 mcg by mouth daily.    . Vitamin D, Ergocalciferol, (DRISDOL) 50000 units CAPS capsule TAKE 1 CAPSULE BY MOUTH EVERY 7 DAYS (Patient taking differently: Take 50,000 Units by mouth  every 7 (seven) days. ) 12 capsule 0  . zonisamide (ZONEGRAN) 100 MG capsule TAKE 1 CAPSULE(100 MG) BY MOUTH DAILY 90 capsule 1   No current facility-administered medications on file prior to visit.     Observations/Objective: Alert, NAD, appropriate mood and affect, resps normal, cn 2-12 intact, moves all 4s, no visible rash or swelling Lab Results  Component Value Date   WBC 6.3 12/29/2018   HGB 13.2 12/29/2018   HCT 39.3 12/29/2018   PLT 284.0 12/29/2018   GLUCOSE 95 12/29/2018   CHOL 142 12/29/2018   TRIG 104.0 12/29/2018   HDL 51.10 12/29/2018   LDLDIRECT 135.0 07/05/2011   LDLCALC 70 12/29/2018   ALT 12 12/29/2018   AST 15 12/29/2018   NA 141 12/29/2018   K 4.1 12/29/2018   CL 107 12/29/2018   CREATININE 0.77 12/29/2018   BUN 11 12/29/2018   CO2 23 12/29/2018   TSH 1.09 12/29/2018   INR 1.3 11/17/2012   HGBA1C 6.6 (H) 12/29/2018   MICROALBUR <0.7 12/29/2018    Assessment and Plan: See notes  Follow Up Instructions: See notes   I discussed the assessment and treatment plan with the patient. The patient was provided an opportunity to ask questions and all were answered. The patient agreed with the plan and demonstrated an understanding of the instructions.   The patient was advised to call back or seek an in-person evaluation if the symptoms worsen or if the condition fails to improve as anticipated.   Cathlean Cower, MD

## 2019-04-05 NOTE — Assessment & Plan Note (Signed)
stable overall by history and exam, recent data reviewed with pt, and pt to continue medical treatment as before,  to f/u any worsening symptoms or concerns  

## 2019-04-05 NOTE — Patient Instructions (Signed)
Please take all new medication as prescribed - the lomotil as needed  Please continue all other medications as before, and refills have been done if requested.  Please have the pharmacy call with any other refills you may need.  Please continue your efforts at being more active, low cholesterol diet, and weight control.  Please keep your appointments with your specialists as you may have planned  Please go to the LAB in the Basement (turn left off the elevator) for the tests to be done today  You will be contacted by phone if any changes need to be made immediately.  Otherwise, you will receive a letter about your results with an explanation, but please check with MyChart first.  Please remember to sign up for MyChart if you have not done so, as this will be important to you in the future with finding out test results, communicating by private email, and scheduling acute appointments online when needed.  You will be contacted regarding the referral for: Gastroenterology

## 2019-04-05 NOTE — Telephone Encounter (Signed)
Patient sent mychart message with c/o fever 101F,cough, SOB, numbness on left side and blurred vision---I have talked with patient, she has used inhalers with some relief, but has also started with diarrhea, prefers to have virtual visit with dr Jenny Reichmann first, I have advised that we are sending link for virtual visit today at 10am, patient has already had virtual visits in the past and understands how to use it, she will watch for link

## 2019-04-05 NOTE — Assessment & Plan Note (Signed)
May need early colonoscopy as is due in Dec 2020, for GI referral

## 2019-04-05 NOTE — Assessment & Plan Note (Signed)
Etiology unclear, for GI panel, lomotil prn,  to f/u any worsening symptoms or concerns

## 2019-04-06 ENCOUNTER — Other Ambulatory Visit (INDEPENDENT_AMBULATORY_CARE_PROVIDER_SITE_OTHER): Payer: BC Managed Care – PPO

## 2019-04-06 DIAGNOSIS — K921 Melena: Secondary | ICD-10-CM | POA: Diagnosis not present

## 2019-04-06 DIAGNOSIS — Z794 Long term (current) use of insulin: Secondary | ICD-10-CM

## 2019-04-06 DIAGNOSIS — R109 Unspecified abdominal pain: Secondary | ICD-10-CM

## 2019-04-06 DIAGNOSIS — R197 Diarrhea, unspecified: Secondary | ICD-10-CM | POA: Diagnosis not present

## 2019-04-06 DIAGNOSIS — E1140 Type 2 diabetes mellitus with diabetic neuropathy, unspecified: Secondary | ICD-10-CM | POA: Diagnosis not present

## 2019-04-06 LAB — HEPATIC FUNCTION PANEL
ALT: 10 U/L (ref 0–35)
AST: 13 U/L (ref 0–37)
Albumin: 3.9 g/dL (ref 3.5–5.2)
Alkaline Phosphatase: 106 U/L (ref 39–117)
Bilirubin, Direct: 0.1 mg/dL (ref 0.0–0.3)
Total Bilirubin: 0.5 mg/dL (ref 0.2–1.2)
Total Protein: 6.9 g/dL (ref 6.0–8.3)

## 2019-04-06 LAB — CBC WITH DIFFERENTIAL/PLATELET
Basophils Absolute: 0.1 10*3/uL (ref 0.0–0.1)
Basophils Relative: 1.2 % (ref 0.0–3.0)
Eosinophils Absolute: 0.2 10*3/uL (ref 0.0–0.7)
Eosinophils Relative: 3.3 % (ref 0.0–5.0)
HCT: 39.4 % (ref 36.0–46.0)
Hemoglobin: 13.1 g/dL (ref 12.0–15.0)
Lymphocytes Relative: 24.6 % (ref 12.0–46.0)
Lymphs Abs: 1.5 10*3/uL (ref 0.7–4.0)
MCHC: 33.3 g/dL (ref 30.0–36.0)
MCV: 86.3 fl (ref 78.0–100.0)
Monocytes Absolute: 0.2 10*3/uL (ref 0.1–1.0)
Monocytes Relative: 4 % (ref 3.0–12.0)
Neutro Abs: 4.2 10*3/uL (ref 1.4–7.7)
Neutrophils Relative %: 66.9 % (ref 43.0–77.0)
Platelets: 258 10*3/uL (ref 150.0–400.0)
RBC: 4.57 Mil/uL (ref 3.87–5.11)
RDW: 15 % (ref 11.5–15.5)
WBC: 6.2 10*3/uL (ref 4.0–10.5)

## 2019-04-06 LAB — BASIC METABOLIC PANEL
BUN: 9 mg/dL (ref 6–23)
CO2: 21 mEq/L (ref 19–32)
Calcium: 9.4 mg/dL (ref 8.4–10.5)
Chloride: 109 mEq/L (ref 96–112)
Creatinine, Ser: 0.81 mg/dL (ref 0.40–1.20)
GFR: 92.42 mL/min (ref >60.00)
Glucose, Bld: 159 mg/dL — ABNORMAL HIGH (ref 70–99)
Potassium: 3.6 mEq/L (ref 3.5–5.1)
Sodium: 140 mEq/L (ref 135–145)

## 2019-04-06 LAB — LIPID PANEL
Cholesterol: 138 mg/dL (ref 0–200)
HDL: 45.9 mg/dL (ref >39.00)
LDL Cholesterol: 70 mg/dL (ref 0–99)
NonHDL: 91.82
Total CHOL/HDL Ratio: 3
Triglycerides: 109 mg/dL (ref 0.0–149.0)
VLDL: 21.8 mg/dL (ref 0.0–40.0)

## 2019-04-06 LAB — HEMOGLOBIN A1C: Hgb A1c MFr Bld: 6.5 % (ref 4.6–6.5)

## 2019-04-09 ENCOUNTER — Telehealth: Payer: Self-pay | Admitting: Internal Medicine

## 2019-04-09 NOTE — Telephone Encounter (Signed)
Patient is requesting 6/11 to 6/16 - Returning to work on 6/17.

## 2019-04-09 NOTE — Telephone Encounter (Signed)
[place] with me, thanks, can you provide the letter?

## 2019-04-09 NOTE — Telephone Encounter (Signed)
Patient is requesting Disability forms to be completed. She has been out of work since 6/11 & starts she was not giving a return to work date.   I did not see nothing in the note on 6/11. Please advise. Thank you.

## 2019-04-09 NOTE — Telephone Encounter (Signed)
This was not discussed at her last visit  Please ask pt what exact dates she feels she needs, and I will consider this based on her medical problem  I am concerned that she is using so many sick days much more than other persons, and am getting hesitant to continue give her cont'd time off

## 2019-04-09 NOTE — Telephone Encounter (Signed)
Forms completed and placed in your box to sign.

## 2019-04-10 ENCOUNTER — Encounter: Payer: Self-pay | Admitting: Internal Medicine

## 2019-04-11 NOTE — Telephone Encounter (Signed)
Forms have been signed, Faxed to [place], Copy sent to scan.  Original mailed to patient, LVM to inform patient.

## 2019-04-12 ENCOUNTER — Encounter: Payer: Self-pay | Admitting: Internal Medicine

## 2019-04-13 LAB — GASTROINTESTINAL PATHOGEN PANEL PCR
C. difficile Tox A/B, PCR: NOT DETECTED
Campylobacter, PCR: NOT DETECTED
Cryptosporidium, PCR: NOT DETECTED
E coli (ETEC) LT/ST PCR: NOT DETECTED
E coli (STEC) stx1/stx2, PCR: NOT DETECTED
E coli 0157, PCR: NOT DETECTED
Giardia lamblia, PCR: NOT DETECTED
Norovirus, PCR: NOT DETECTED
Rotavirus A, PCR: NOT DETECTED
Salmonella, PCR: NOT DETECTED
Shigella, PCR: NOT DETECTED

## 2019-04-19 ENCOUNTER — Encounter: Payer: Self-pay | Admitting: Internal Medicine

## 2019-04-20 ENCOUNTER — Telehealth: Payer: Self-pay | Admitting: Internal Medicine

## 2019-04-20 MED ORDER — ALPRAZOLAM 0.5 MG PO TABS: ORAL_TABLET | ORAL | 2 refills | Status: DC

## 2019-04-20 NOTE — Telephone Encounter (Signed)
Done erx 

## 2019-04-23 DIAGNOSIS — M47817 Spondylosis without myelopathy or radiculopathy, lumbosacral region: Secondary | ICD-10-CM | POA: Diagnosis not present

## 2019-04-30 ENCOUNTER — Other Ambulatory Visit: Payer: Self-pay | Admitting: Emergency Medicine

## 2019-04-30 ENCOUNTER — Other Ambulatory Visit (HOSPITAL_COMMUNITY)
Admission: RE | Admit: 2019-04-30 | Discharge: 2019-04-30 | Disposition: A | Discharge status: Home / Self Care | Payer: BC Managed Care – PPO | Source: Ambulatory Visit | Attending: Emergency Medicine | Admitting: Emergency Medicine

## 2019-04-30 DIAGNOSIS — Z1159 Encounter for screening for other viral diseases: Secondary | ICD-10-CM | POA: Diagnosis not present

## 2019-04-30 DIAGNOSIS — Z01812 Encounter for preprocedural laboratory examination: Secondary | ICD-10-CM | POA: Diagnosis not present

## 2019-05-01 ENCOUNTER — Encounter: Payer: Self-pay | Admitting: Internal Medicine

## 2019-05-01 DIAGNOSIS — Z7984 Long term (current) use of oral hypoglycemic drugs: Secondary | ICD-10-CM | POA: Diagnosis not present

## 2019-05-01 DIAGNOSIS — H40012 Open angle with borderline findings, low risk, left eye: Secondary | ICD-10-CM | POA: Diagnosis not present

## 2019-05-01 DIAGNOSIS — H35013 Changes in retinal vascular appearance, bilateral: Secondary | ICD-10-CM | POA: Diagnosis not present

## 2019-05-01 DIAGNOSIS — E119 Type 2 diabetes mellitus without complications: Secondary | ICD-10-CM | POA: Diagnosis not present

## 2019-05-01 LAB — SARS CORONAVIRUS 2 (TAT 6-24 HRS): SARS Coronavirus 2: NEGATIVE

## 2019-05-03 LAB — HM DIABETES EYE EXAM

## 2019-05-03 NOTE — Progress Notes (Signed)
_0  ID: Nicole Melendez, female    DOB: August 09, 1976, 43 y.o.   MRN: [id number]  Chief Complaint  Patient presents with  . Follow-up    PFT follow-up, recent CT, doing well    Referring provider: Biagio Borg, MD  HPI:  43 year old female number smoker followed in our office for sarcoidosis, recurrent bronchitis  PMH: Morbid obesity, GERD, Smoker/ Smoking History: Former Smoker 2014. Roommate Smokes Maintenance:  Symbicort 160 Pt of: Dr. Lamonte Sakai   05/04/2019  - Visit   43 year old female former smoker presenting to our office today after completing a pulmonary function test as well as completing a recent CT.  See those results listed below:  01/04/2019-CT chest high-res- imaging stigmata of sarcoidosis very similar to prior study in 2016, no acute findings  05/04/2019-pulmonary function test-FVC 2.62 (81% predicted close parentheses, postbronchodilator ratio 83, postbronchodilator FEV1 2.23 (85% predicted), no bronchodilator response, DLCO 77  Patient has been tested for SARS-CoV-2 2 times once back in May as well as this month prior to pulmonary function testing has been negative both times.  Patient is under the impression that she thinks she had SARS-CoV-2.  Patient was evaluated by primary care in March.  Patient also was evaluated by the emergency room provider in May/2020.  Patient also has a long history of upper respiratory infections with similar symptoms.  Despite the COVID-19 global pandemic.  Patient reports that she is doing okay today.  No changes with her breathing.  She is still wheezing at night.  She maintains this by taking Symbicort 2 puffs every 12 hours as well as her rescue inhaler 2 puffs every 12 hours.  She reports that this helps manage her breathing.  She denies any acid reflux symptoms due to her currently being on a PPI.  She denies any breakthrough reflux.     Tests:  04/30/2019-SARS-CoV-2-negative  01/04/2019-CT chest high-res- imaging stigmata of  sarcoidosis very similar to prior study in 2016, no acute findings  09/26/2017-echocardiogram-LV ejection fraction 60 to 65%, PA pressure 27  09/23/2015-pulmonary function test- FVC 2.71 (82% predicted), postbronchodilator ratio 84, postbronchodilator FEV1 2.31 (86% predicted), no bronchodilator response, DLCO 64  05/04/2019-pulmonary function test-FVC 2.62 (81% predicted close parentheses, postbronchodilator ratio 83, postbronchodilator FEV1 2.23 (85% predicted), no bronchodilator response, DLCO 77  FENO:  No results found for: NITRICOXIDE  PFT: PFT Results Latest Ref Rng & Units 05/04/2019 09/23/2015 01/28/2014  FVC-Pre L 2.62 2.71 2.73  FVC-Predicted Pre % 81 82 82  FVC-Post L 2.69 2.77 2.74  FVC-Predicted Post % 83 84 82  Pre FEV1/FVC % % 84 84 86  Post FEV1/FCV % % 83 84 80  FEV1-Pre L 2.19 2.28 2.35  FEV1-Predicted Pre % 83 84 86  FEV1-Post L 2.23 2.31 2.19  DLCO UNC% % 77 64 64  DLCO COR %Predicted % 104 85 81  TLC L 4.28 4.11 3.90  TLC % Predicted % 79 76 72  RV % Predicted % 96 85 71    Imaging: No results found.    Specialty Problems      Pulmonary Problems   Allergic rhinitis   Acute upper respiratory infection   Wheezing   Cough   Acute bronchitis   DOE (dyspnea on exertion)   Epistaxis   Sarcoidosis of lung ([place])    01/04/2019-CT chest high-res- imaging stigmata of sarcoidosis very similar to prior study in 2016, no acute findings  09/23/2015-pulmonary function test- FVC 2.71 (82% predicted), postbronchodilator ratio 84, postbronchodilator FEV1 2.31 (86%  predicted), no bronchodilator response, DLCO 64  05/04/2019-pulmonary function test-FVC 2.62 (81% predicted close parentheses, postbronchodilator ratio 83, postbronchodilator FEV1 2.23 (85% predicted), no bronchodilator response, DLCO 77      Snoring      Allergies  Allergen Reactions  . Azithromycin Hives    (z pak) hives  . Bee Venom Anaphylaxis  . Flagyl [Metronidazole] Hives and Shortness Of  Breath  . Penicillins Shortness Of Breath and Swelling    Has patient had a PCN reaction causing immediate rash, facial/tongue/throat swelling, SOB or lightheadedness with hypotension: Yes Has patient had a PCN reaction causing severe rash involving mucus membranes or skin necrosis: No Has patient had a PCN reaction that required hospitalization No Has patient had a PCN reaction occurring within the last 10 years: No If all of the above answers are "NO", then may proceed with Cephalosporin use.  REACTION: swelling and difficulty breathing  . Shellfish Allergy Anaphylaxis  . Klonopin [Clonazepam] Other (See Comments)    Memory difficulty    Immunization History  Administered Date(s) Administered  . Influenza Split 07/07/2011, 07/21/2012  . Influenza,inj,Quad PF,6+ Mos 01/04/2014, 07/08/2014, 07/02/2015, 06/15/2017, 07/18/2018  . Pneumococcal Conjugate-13 10/28/2015  . Pneumococcal Polysaccharide-23 11/26/2015  . Td 10/25/1997  . Tdap 01/03/2013    Past Medical History:  Diagnosis Date  . Allergic rhinitis, cause unspecified   . Anemia   . Anxiety   . Depression   . Diabetes mellitus type II    steroid related, patient states "im no longer diabetic"  . Essential hypertension 08/08/2015  . GERD (gastroesophageal reflux disease)   . History of blood transfusion   . Hyperlipidemia   . Menorrhagia   . Migraine   . Morbid obesity ([place])   . Otitis media 06/28/2015  . Peptic ulcer disease   . Positive ANA (antinuclear antibody) 02/14/2012  . Sarcoid    including hand per rheumatology-Dr. Amil Amen  . Sarcoidosis of lung ([place])   . Shortness of breath    on exertion  . Varicose veins with pain     Tobacco History: Social History   Tobacco Use  Smoking Status Former Smoker  . Packs/day: 1.00  . Years: 20.00  . Pack years: 20.00  . Types: Cigarettes  . Quit date: 10/25/2012  . Years since quitting: 6.5  Smokeless Tobacco Never Used  Tobacco Comment   QUIT 04/2010 AND STARTED  BACK 2014 X 3 MONTHS. less than 1 ppd.  started at age 76.     Counseling given: Not Answered Comment: QUIT 04/2010 AND STARTED BACK 2014 X 3 MONTHS. less than 1 ppd.  started at age 71.     Continue to not smoke  Outpatient Encounter Medications as of 05/04/2019  Medication Sig  . acetaminophen (TYLENOL) 500 MG tablet Take 1 tablet (500 mg total) by mouth every 6 (six) hours as needed.  Marland Kitchen albuterol (PROVENTIL HFA;VENTOLIN HFA) 108 (90 Base) MCG/ACT inhaler Inhale 2 puffs into the lungs every 4 (four) hours as needed for wheezing or shortness of breath.  . ALPRAZolam (XANAX) 0.5 MG tablet TAKE 1 TABLET(0.5 MG) BY MOUTH THREE TIMES DAILY AS NEEDED  . azelastine (ASTELIN) 0.1 % nasal spray Place 2 sprays into both nostrils 2 (two) times daily. Use in each nostril as directed  . Blood Glucose Monitoring Suppl (ONE TOUCH ULTRA 2) W/DEVICE KIT Use as directed four times daily. Dx: E11.9  . budesonide-formoterol (SYMBICORT) 160-4.5 MCG/ACT inhaler Inhale 2 puffs into the lungs 2 (two) times daily.  . cetirizine (  ZYRTEC) 10 MG tablet TAKE 1 TABLET(10 MG) BY MOUTH DAILY  . fluticasone (FLONASE) 50 MCG/ACT nasal spray Place 2 sprays into both nostrils daily.  . furosemide (LASIX) 40 MG tablet 1 tab by mouth every day in the AM and then 1 as needed only for persistent swelling in the PM  . gabapentin (NEURONTIN) 300 MG capsule TAKE 2 CAPSULES(600 MG) BY MOUTH THREE TIMES DAILY  . glucose blood (ONE TOUCH ULTRA TEST) test strip Use to check blood sugars four times a day Dx E11.9  . hydrOXYzine (ATARAX/VISTARIL) 10 MG tablet TAKE 1 TABLET(10 MG) BY MOUTH THREE TIMES DAILY AS NEEDED (Patient taking differently: Take 10 mg by mouth 3 (three) times daily as needed for anxiety. )  . insulin lispro (HUMALOG) 100 UNIT/ML injection INJECT UP TO 12 UNITS INTO THE SKIN THREE TIMES DAILY WITH MEALS WITH SLIDING SCALE AS DIRECTED  . Insulin Syringe-Needle U-100 25G X 1" 1 ML MISC Use to administer humalog insulin  three times a day Dx E11.9  . lidocaine (LIDODERM) 5 % Place 1 patch onto the skin daily. Remove & Discard patch within 12 hours or as directed by MD  . omeprazole (PRILOSEC) 40 MG capsule TAKE 1 CAPSULE(40 MG) BY MOUTH DAILY  . ONE TOUCH ULTRA TEST test strip TEST BLOOD SUGAR FOUR TIMES DAILY  . oxyCODONE-acetaminophen (PERCOCET) 10-325 MG tablet Take 1 tablet by mouth every 8 (eight) hours as needed for pain.   . potassium chloride (K-DUR) 10 MEQ tablet 3 tab by mouth per day only when taking the lasix  . rosuvastatin (CRESTOR) 40 MG tablet TAKE 1 TABLET(40 MG) BY MOUTH DAILY  . sertraline (ZOLOFT) 100 MG tablet TAKE 2 TABLETS(200 MG) BY MOUTH DAILY  . SUMAtriptan (IMITREX) 100 MG tablet Take 1 tablet by mouth at the onset of a headache. May repeat in 2 hours if headache persists or recurs.  Marland Kitchen telmisartan (MICARDIS) 40 MG tablet TAKE 1 TABLET(40 MG) BY MOUTH DAILY (Patient taking differently: Take 40 mg by mouth daily. )  . TURMERIC PO Take 1 tablet by mouth daily.  . vitamin B-12 (CYANOCOBALAMIN) 1000 MCG tablet Take 1,000 mcg by mouth daily.  . Vitamin D, Ergocalciferol, (DRISDOL) 50000 units CAPS capsule TAKE 1 CAPSULE BY MOUTH EVERY 7 DAYS (Patient taking differently: Take 50,000 Units by mouth every 7 (seven) days. )  . zonisamide (ZONEGRAN) 100 MG capsule TAKE 1 CAPSULE(100 MG) BY MOUTH DAILY  . Diclofenac Sodium (PENNSAID) 2 % SOLN Apply 1 pump twice daily as needed. (Patient not taking: Reported on 05/04/2019)  . doxycycline (VIBRA-TABS) 100 MG tablet Take 1 tablet (100 mg total) by mouth 2 (two) times daily. (Patient not taking: Reported on 05/04/2019)  . levalbuterol (XOPENEX HFA) 45 MCG/ACT inhaler Inhale 1-2 puffs into the lungs every 8 (eight) hours as needed for wheezing or shortness of breath. USE 2 PUFFS 3 TIMES DAILY X 5 DAYS THEN BACK TO HOME REGIMEN AS NEEDED. (Patient not taking: Reported on 05/04/2019)  . promethazine-dextromethorphan (PROMETHAZINE-DM) 6.25-15 MG/5ML syrup Take 5  mLs by mouth 4 (four) times daily as needed for cough. (Patient not taking: Reported on 05/04/2019)  . [DISCONTINUED] diphenoxylate-atropine (LOMOTIL) 2.5-0.025 MG tablet Take 1 tablet by mouth 4 (four) times daily as needed for diarrhea or loose stools.   No facility-administered encounter medications on file as of 05/04/2019.      Review of Systems  Review of Systems  Constitutional: Negative for fatigue and fever.  HENT: Negative for congestion, sinus pressure and  sinus pain.   Respiratory: Positive for shortness of breath and wheezing. Negative for chest tightness.   Cardiovascular: Negative for chest pain and palpitations.  Gastrointestinal: Negative for diarrhea, nausea and vomiting.  Musculoskeletal: Negative for arthralgias and gait problem.  Neurological: Negative for dizziness.  Psychiatric/Behavioral: Negative for sleep disturbance. The patient is not nervous/anxious.      Physical Exam  BP 120/66 (BP Location: Left Wrist, Patient Position: Sitting, Cuff Size: Large)   Pulse 80   Temp 98.5 F (36.9 C) (Oral)   Ht 5' 6" (1.676 m)   Wt (!) 338 lb (153.3 kg)   LMP 07/17/2012   SpO2 99%   BMI 54.55 kg/m   Wt Readings from Last 5 Encounters:  05/04/19 (!) 338 lb (153.3 kg)  12/29/18 (!) 336 lb (152.4 kg)  11/24/18 (!) 323 lb (146.5 kg)  11/23/18 (!) 335 lb 12.8 oz (152.3 kg)  11/17/18 (!) 336 lb 3.2 oz (152.5 kg)    Physical Exam Vitals signs and nursing note reviewed.  Constitutional:      General: She is not in acute distress.    Appearance: Normal appearance. She is obese.  HENT:     Head: Normocephalic and atraumatic.     Right Ear: Tympanic membrane, ear canal and external ear normal. There is no impacted cerumen.     Left Ear: Tympanic membrane, ear canal and external ear normal. There is no impacted cerumen.     Nose: Nose normal. No congestion.     Mouth/Throat:     Mouth: Mucous membranes are moist.     Pharynx: Oropharynx is clear.  Eyes:      Pupils: Pupils are equal, round, and reactive to light.  Neck:     Musculoskeletal: Normal range of motion.  Cardiovascular:     Rate and Rhythm: Normal rate and regular rhythm.     Pulses: Normal pulses.     Heart sounds: Normal heart sounds. No murmur.  Pulmonary:     Effort: Pulmonary effort is normal. No respiratory distress.     Breath sounds: Normal breath sounds. No decreased air movement. No decreased breath sounds, wheezing or rales.  Abdominal:     General: Abdomen is flat. Bowel sounds are normal.     Palpations: Abdomen is soft.  Musculoskeletal: Normal range of motion.        General: No swelling.  Skin:    General: Skin is warm and dry.     Capillary Refill: Capillary refill takes less than 2 seconds.  Neurological:     General: No focal deficit present.     Mental Status: She is alert and oriented to person, place, and time. Mental status is at baseline.     Gait: Gait normal.  Psychiatric:        Mood and Affect: Mood normal.        Behavior: Behavior normal.        Thought Content: Thought content normal.        Judgment: Judgment normal.      Lab Results:  CBC    Component Value Date/Time   WBC 6.2 04/06/2019 1109   RBC 4.57 04/06/2019 1109   HGB 13.1 04/06/2019 1109   HGB 11.4 (L) 03/07/2013 0458   HCT 39.4 04/06/2019 1109   HCT 33.3 (L) 03/07/2013 0458   PLT 258.0 04/06/2019 1109   PLT 295 03/07/2013 0458   MCV 86.3 04/06/2019 1109   MCV 78 (L) 03/07/2013 0458   MCH 28.9 09/27/2017  0604   MCHC 33.3 04/06/2019 1109   RDW 15.0 04/06/2019 1109   RDW 18.3 (H) 03/07/2013 0458   LYMPHSABS 1.5 04/06/2019 1109   LYMPHSABS 0.7 (L) 03/07/2013 0458   MONOABS 0.2 04/06/2019 1109   MONOABS 0.6 03/07/2013 0458   EOSABS 0.2 04/06/2019 1109   EOSABS 0.0 03/07/2013 0458   BASOSABS 0.1 04/06/2019 1109   BASOSABS 0.1 03/07/2013 0458    BMET    Component Value Date/Time   NA 140 04/06/2019 1109   NA 137 03/07/2013 0458   K 3.6 04/06/2019 1109   K 4.1  03/07/2013 0458   CL 109 04/06/2019 1109   CL 107 03/07/2013 0458   CO2 21 04/06/2019 1109   CO2 24 03/07/2013 0458   GLUCOSE 159 (H) 04/06/2019 1109   GLUCOSE 96 03/07/2013 0458   BUN 9 04/06/2019 1109   BUN 6 (L) 03/07/2013 0458   CREATININE 0.81 04/06/2019 1109   CREATININE 0.76 03/07/2013 0458   CALCIUM 9.4 04/06/2019 1109   CALCIUM 9.0 03/07/2013 0458   GFRNONAA >60 09/27/2017 0604   GFRNONAA >60 03/07/2013 0458   GFRAA >60 09/27/2017 0604   GFRAA >60 03/07/2013 0458    BNP No results found for: BNP  ProBNP No results found for: PROBNP    Assessment & Plan:   Gastroesophageal reflux disease Plan: Continue PPI Continue to follow GERD lifestyle management  Sarcoidosis of lung ([place]) Plan: Continue Symbicort 160 Work diligently on reducing BMI and weight to help with management of breathing  Morbid obesity ([place]) Assessment: BMI 54.55 today Weight is increasing Patient leads a very sedentary lifestyle Patient reports that her step count equals out to about half a mile a day She is currently not exercising due to the aquatic center being close due to COVID-19 Patient is status post gastric sleeve  Plan: Referral to medical weight management today Work on increasing physical activity Work on reducing BMI     Return in about 6 months (around 11/04/2019), or if symptoms worsen or fail to improve, for Follow up with Dr. Lamonte Sakai.   Lauraine Rinne, NP 05/04/2019   This appointment was 26 minutes long with over 50% of the time in direct face-to-face patient care, assessment, plan of care, and follow-up.

## 2019-05-04 ENCOUNTER — Other Ambulatory Visit: Payer: Self-pay

## 2019-05-04 ENCOUNTER — Encounter: Payer: Self-pay | Admitting: Pulmonary Disease

## 2019-05-04 ENCOUNTER — Ambulatory Visit (INDEPENDENT_AMBULATORY_CARE_PROVIDER_SITE_OTHER): Payer: BC Managed Care – PPO | Admitting: Pulmonary Disease

## 2019-05-04 ENCOUNTER — Ambulatory Visit (INDEPENDENT_AMBULATORY_CARE_PROVIDER_SITE_OTHER): Payer: BC Managed Care – PPO | Admitting: Emergency Medicine

## 2019-05-04 DIAGNOSIS — D860 Sarcoidosis of lung: Secondary | ICD-10-CM | POA: Diagnosis not present

## 2019-05-04 DIAGNOSIS — D869 Sarcoidosis, unspecified: Secondary | ICD-10-CM

## 2019-05-04 DIAGNOSIS — K219 Gastro-esophageal reflux disease without esophagitis: Secondary | ICD-10-CM

## 2019-05-04 LAB — PULMONARY FUNCTION TEST
DL/VA % pred: 104 %
DL/VA: 4.51 ml/min/mmHg/L
DLCO unc % pred: 77 %
DLCO unc: 17.77 ml/min/mmHg
FEF 25-75 Post: 2.61 L/sec
FEF 25-75 Pre: 2.36 L/sec
FEF2575-%Change-Post: 10 %
FEF2575-%Pred-Post: 93 %
FEF2575-%Pred-Pre: 84 %
FEV1-%Change-Post: 1 %
FEV1-%Pred-Post: 85 %
FEV1-%Pred-Pre: 83 %
FEV1-Post: 2.23 L
FEV1-Pre: 2.19 L
FEV1FVC-%Change-Post: -1 %
FEV1FVC-%Pred-Pre: 101 %
FEV6-%Change-Post: 3 %
FEV6-%Pred-Post: 85 %
FEV6-%Pred-Pre: 82 %
FEV6-Post: 2.68 L
FEV6-Pre: 2.6 L
FEV6FVC-%Change-Post: 0 %
FEV6FVC-%Pred-Post: 102 %
FEV6FVC-%Pred-Pre: 101 %
FVC-%Change-Post: 2 %
FVC-%Pred-Post: 83 %
FVC-%Pred-Pre: 81 %
FVC-Post: 2.69 L
FVC-Pre: 2.62 L
Post FEV1/FVC ratio: 83 %
Post FEV6/FVC ratio: 100 %
Pre FEV1/FVC ratio: 84 %
Pre FEV6/FVC Ratio: 99 %
RV % pred: 96 %
RV: 1.72 L
TLC % pred: 79 %
TLC: 4.28 L

## 2019-05-04 NOTE — Assessment & Plan Note (Signed)
Assessment: BMI 54.55 today Weight is increasing Patient leads a very sedentary lifestyle Patient reports that her step count equals out to about half a mile a day She is currently not exercising due to the aquatic center being close due to COVID-19 Patient is status post gastric sleeve  Plan: Referral to medical weight management today Work on increasing physical activity Work on reducing BMI

## 2019-05-04 NOTE — Patient Instructions (Addendum)
Continue Symbicort 160 >>> 2 puffs in the morning right when you wake up, rinse out your mouth after use, 12 hours later 2 puffs, rinse after use >>> Take this daily, no matter what >>> This is not a rescue inhaler   Contact us to let us know how the Symbicort coupon card works, if the cost of Symbicort is still elevated let me know and I can place an order to have a pharmacist contact you  Referral to medical weight management   Continue to work on daily physical activity and exercise Great idea to go back to swimming at the aquatic center when they reopen  Return in about 6 months (around 11/04/2019), or if symptoms worsen or fail to improve, for Follow up with Dr. Lamonte Sakai.   Coronavirus (COVID-19) Are you at risk?  Are you at risk for the Coronavirus (COVID-19)?  To be considered HIGH RISK for Coronavirus (COVID-19), you have to meet the following criteria:  . Traveled to [place], [place], [place], [place] or [place]; or in the [place] to [place], [place], [place], or [place]; and have fever, cough, and shortness of breath within the last 2 weeks of travel OR . Been in close contact with a person diagnosed with COVID-19 within the last 2 weeks and have fever, cough, and shortness of breath . IF YOU DO NOT MEET THESE CRITERIA, YOU ARE CONSIDERED LOW RISK FOR COVID-19.  What to do if you are HIGH RISK for COVID-19?  Marland Kitchen If you are having a medical emergency, call 911. . Seek medical care right away. Before you go to a doctor's office, urgent care or emergency department, call ahead and tell them about your recent travel, contact with someone diagnosed with COVID-19, and your symptoms. You should receive instructions from your physician's office regarding next steps of care.  . When you arrive at healthcare provider, tell the healthcare staff immediately you have returned from visiting [place], [place], [place], [place] or [place]; or traveled in the [place] to [place], [place], [place], or [place]; in the last two weeks or you have been in close contact with a person diagnosed with COVID-19 in the last 2 weeks.   . Tell the health care staff about your symptoms: fever, cough and shortness of breath. . After you have been seen by a medical provider, you will be either: o Tested for (COVID-19) and discharged home on quarantine except to seek medical care if symptoms worsen, and asked to  - Stay home and avoid contact with others until you get your results (4-5 days)  - Avoid travel on public transportation if possible (such as bus, train, or airplane) or o Sent to the Emergency Department by EMS for evaluation, COVID-19 testing, and possible admission depending on your condition and test results.  What to do if you are LOW RISK for COVID-19?  Reduce your risk of any infection by using the same precautions used for avoiding the common cold or flu:  Marland Kitchen Wash your hands often with soap and warm water for at least 20 seconds.  If soap and water are not readily available, use an alcohol-based hand sanitizer with at least 60% alcohol.  . If coughing or sneezing, cover your mouth and nose by coughing or sneezing into the elbow areas of your shirt or coat, into a tissue or into your sleeve (not your hands). . Avoid shaking hands with others and consider head nods or verbal greetings only. Marland Kitchen  Avoid touching your eyes, nose, or mouth with unwashed hands.  . Avoid close contact with people who are sick. . Avoid places or events with large numbers of people in one location, like concerts or sporting events. . Carefully consider travel plans you have or are making. . If you are planning any travel outside or inside the [place], visit the CDC's Travelers' Health webpage for the latest health notices. . If you have some symptoms but not all symptoms, continue to monitor at home and seek medical attention if your symptoms worsen. . If you are having a medical emergency, call  911.   [place] / e-Visit: [email or web address]         [facility]: [facility]: [phone]                   [facility]: [phone]           It is flu season:   >>> Best ways to protect herself from the flu: Receive the yearly flu vaccine, practice good hand hygiene washing with soap and also using hand sanitizer when available, eat a nutritious meals, get adequate rest, hydrate appropriately   Please contact the office if your symptoms worsen or you have concerns that you are not improving.   Thank you for choosing [facility] for your healthcare, and for allowing [place] to partner with you on your healthcare journey. I am thankful to be able to provide care to you today.   Wyn Quaker FNP-C

## 2019-05-04 NOTE — Assessment & Plan Note (Addendum)
Plan: Continue Symbicort 160 Work diligently on reducing BMI and weight to help with management of breathing

## 2019-05-04 NOTE — Progress Notes (Signed)
PFT done today. 

## 2019-05-04 NOTE — Assessment & Plan Note (Signed)
Plan: Continue PPI Continue to follow GERD lifestyle management

## 2019-05-09 ENCOUNTER — Ambulatory Visit (INDEPENDENT_AMBULATORY_CARE_PROVIDER_SITE_OTHER): Payer: BC Managed Care – PPO | Admitting: Internal Medicine

## 2019-05-09 DIAGNOSIS — E1140 Type 2 diabetes mellitus with diabetic neuropathy, unspecified: Secondary | ICD-10-CM | POA: Diagnosis not present

## 2019-05-09 DIAGNOSIS — G43009 Migraine without aura, not intractable, without status migrainosus: Secondary | ICD-10-CM

## 2019-05-09 DIAGNOSIS — G8929 Other chronic pain: Secondary | ICD-10-CM

## 2019-05-09 DIAGNOSIS — Z794 Long term (current) use of insulin: Secondary | ICD-10-CM

## 2019-05-09 NOTE — Patient Instructions (Signed)
Please continue all other medications as before, and refills have been done if requested.  Please have the pharmacy call with any other refills you may need.  Please keep your appointments with your specialists as you may have planned  I will send Dr Tomi Likens a short message and ask his office to contact you  You are given the work note

## 2019-05-09 NOTE — Progress Notes (Signed)
Patient ID: Nicole Melendez, female   DOB: 06-Apr-1976, 43 y.o.   MRN: [id number]  Virtual Visit via Video Note  I connected with [name] on 05/09/19 at  7:20 PM EDT by a video enabled telemedicine application and verified that I am speaking with the correct person using two identifiers.  Location: Patient: at home Provider: at office   I discussed the limitations of evaluation and management by telemedicine and the availability of in person appointments. The patient expressed understanding and agreed to proceed.  History of Present Illness: Here to f/u, mentions ongoing recurrent migraine without change in frequency or severity, just not resolving.   C/o migraine HA, also balance issue when the pain starts, has fell x 2, followed by neurology. Has some relief with imitrex.  Also with chronic LBP, now s/p right lower back ablation x 2 wks about June 29, unable to get hold of neurology; had left side ablation done about 1 mo ago without falls, only some pain post procedure.  Has some mild constant Numb tingling to right leg with intermittent weakness and buckling.  Has not tried to contact the proceduralist.    Pt denies chest pain, increased sob or doe, wheezing, orthopnea, PND, increased LE swelling, palpitations, dizziness or syncope.  Pt denies new neurological symptoms such as new headache, or facial or extremity weakness or numbness   Pt denies polydipsia, polyuria, Past Medical History:  Diagnosis Date  . Allergic rhinitis, cause unspecified   . Anemia   . Anxiety   . Depression   . Diabetes mellitus type II    steroid related, patient states "im no longer diabetic"  . Essential hypertension 08/08/2015  . GERD (gastroesophageal reflux disease)   . History of blood transfusion   . Hyperlipidemia   . Menorrhagia   . Migraine   . Morbid obesity ([place])   . Otitis media 06/28/2015  . Peptic ulcer disease   . Positive ANA (antinuclear antibody) 02/14/2012  . Sarcoid    including  hand per rheumatology-Dr. Amil Amen  . Sarcoidosis of lung ([place])   . Shortness of breath    on exertion  . Varicose veins with pain    Past Surgical History:  Procedure Laterality Date  . ABDOMINAL HYSTERECTOMY    . COLONOSCOPY WITH PROPOFOL N/A 10/19/2016   Procedure: COLONOSCOPY WITH PROPOFOL;  Surgeon: Mauri Pole, MD;  Location: [facility];  Service: Endoscopy;  Laterality: N/A;  . ESOPHAGOGASTRODUODENOSCOPY (EGD) WITH PROPOFOL N/A 10/19/2016   Procedure: ESOPHAGOGASTRODUODENOSCOPY (EGD) WITH PROPOFOL;  Surgeon: Mauri Pole, MD;  Location: [facility];  Service: Endoscopy;  Laterality: N/A;  . HERNIA MESH REMOVAL  02/2013  . uterine ablation  03/2010  . WISDOM TOOTH EXTRACTION      reports that she quit smoking about 6 years ago. Her smoking use included cigarettes. She has a 20.00 pack-year smoking history. She has never used smokeless tobacco. She reports that she does not drink alcohol or use drugs. family history includes Allergies in her mother; Bone cancer in her maternal aunt; Breast cancer in her maternal aunt; Diabetes in her father and mother; Heart attack in her mother; Heart disease in her father; Hypertension in an other family member; Lung cancer in her maternal aunt; Ovarian cancer in her maternal aunt; Rheum arthritis in her father; Stroke in her father and maternal uncle. Allergies  Allergen Reactions  . Azithromycin Hives    (z pak) hives  . Bee Venom Anaphylaxis  . Flagyl [Metronidazole] Hives and  Shortness Of Breath  . Penicillins Shortness Of Breath and Swelling    Has patient had a PCN reaction causing immediate rash, facial/tongue/throat swelling, SOB or lightheadedness with hypotension: Yes Has patient had a PCN reaction causing severe rash involving mucus membranes or skin necrosis: No Has patient had a PCN reaction that required hospitalization No Has patient had a PCN reaction occurring within the last 10 years: No If all of the above answers  are "NO", then may proceed with Cephalosporin use.  REACTION: swelling and difficulty breathing  . Shellfish Allergy Anaphylaxis  . Klonopin [Clonazepam] Other (See Comments)    Memory difficulty   Current Outpatient Medications on File Prior to Visit  Medication Sig Dispense Refill  . acetaminophen (TYLENOL) 500 MG tablet Take 1 tablet (500 mg total) by mouth every 6 (six) hours as needed. 30 tablet 0  . albuterol (PROVENTIL HFA;VENTOLIN HFA) 108 (90 Base) MCG/ACT inhaler Inhale 2 puffs into the lungs every 4 (four) hours as needed for wheezing or shortness of breath. 1 Inhaler 2  . ALPRAZolam (XANAX) 0.5 MG tablet TAKE 1 TABLET(0.5 MG) BY MOUTH THREE TIMES DAILY AS NEEDED 90 tablet 2  . azelastine (ASTELIN) 0.1 % nasal spray Place 2 sprays into both nostrils 2 (two) times daily. Use in each nostril as directed 30 mL 12  . Blood Glucose Monitoring Suppl (ONE TOUCH ULTRA 2) W/DEVICE KIT Use as directed four times daily. Dx: E11.9 1 each 0  . budesonide-formoterol (SYMBICORT) 160-4.5 MCG/ACT inhaler Inhale 2 puffs into the lungs 2 (two) times daily. 1 Inhaler 5  . cetirizine (ZYRTEC) 10 MG tablet TAKE 1 TABLET(10 MG) BY MOUTH DAILY 90 tablet 1  . Diclofenac Sodium (PENNSAID) 2 % SOLN Apply 1 pump twice daily as needed. (Patient not taking: Reported on 05/04/2019) 112 g 3  . doxycycline (VIBRA-TABS) 100 MG tablet Take 1 tablet (100 mg total) by mouth 2 (two) times daily. (Patient not taking: Reported on 05/04/2019) 14 tablet 0  . fluticasone (FLONASE) 50 MCG/ACT nasal spray Place 2 sprays into both nostrils daily. 16 g 0  . furosemide (LASIX) 40 MG tablet 1 tab by mouth every day in the AM and then 1 as needed only for persistent swelling in the PM 60 tablet 11  . gabapentin (NEURONTIN) 300 MG capsule TAKE 2 CAPSULES(600 MG) BY MOUTH THREE TIMES DAILY 180 capsule 5  . glucose blood (ONE TOUCH ULTRA TEST) test strip Use to check blood sugars four times a day Dx E11.9 400 each 11  . hydrOXYzine  (ATARAX/VISTARIL) 10 MG tablet TAKE 1 TABLET(10 MG) BY MOUTH THREE TIMES DAILY AS NEEDED (Patient taking differently: Take 10 mg by mouth 3 (three) times daily as needed for anxiety. ) 60 tablet 0  . insulin lispro (HUMALOG) 100 UNIT/ML injection INJECT UP TO 12 UNITS INTO THE SKIN THREE TIMES DAILY WITH MEALS WITH SLIDING SCALE AS DIRECTED 20 mL 11  . Insulin Syringe-Needle U-100 25G X 1" 1 ML MISC Use to administer humalog insulin three times a day Dx E11.9 100 each 5  . levalbuterol (XOPENEX HFA) 45 MCG/ACT inhaler Inhale 1-2 puffs into the lungs every 8 (eight) hours as needed for wheezing or shortness of breath. USE 2 PUFFS 3 TIMES DAILY X 5 DAYS THEN BACK TO HOME REGIMEN AS NEEDED. (Patient not taking: Reported on 05/04/2019) 1 Inhaler 0  . lidocaine (LIDODERM) 5 % Place 1 patch onto the skin daily. Remove & Discard patch within 12 hours or as directed by MD  60 patch 2  . omeprazole (PRILOSEC) 40 MG capsule TAKE 1 CAPSULE(40 MG) BY MOUTH DAILY 90 capsule 1  . ONE TOUCH ULTRA TEST test strip TEST BLOOD SUGAR FOUR TIMES DAILY 100 each 0  . oxyCODONE-acetaminophen (PERCOCET) 10-325 MG tablet Take 1 tablet by mouth every 8 (eight) hours as needed for pain.   0  . potassium chloride (K-DUR) 10 MEQ tablet 3 tab by mouth per day only when taking the lasix 90 tablet 11  . promethazine-dextromethorphan (PROMETHAZINE-DM) 6.25-15 MG/5ML syrup Take 5 mLs by mouth 4 (four) times daily as needed for cough. (Patient not taking: Reported on 05/04/2019) 100 mL 0  . rosuvastatin (CRESTOR) 40 MG tablet TAKE 1 TABLET(40 MG) BY MOUTH DAILY 90 tablet 1  . sertraline (ZOLOFT) 100 MG tablet TAKE 2 TABLETS(200 MG) BY MOUTH DAILY 180 tablet 1  . SUMAtriptan (IMITREX) 100 MG tablet Take 1 tablet by mouth at the onset of a headache. May repeat in 2 hours if headache persists or recurs. 10 tablet 2  . telmisartan (MICARDIS) 40 MG tablet TAKE 1 TABLET(40 MG) BY MOUTH DAILY (Patient taking differently: Take 40 mg by mouth daily.  ) 90 tablet 0  . TURMERIC PO Take 1 tablet by mouth daily.    . vitamin B-12 (CYANOCOBALAMIN) 1000 MCG tablet Take 1,000 mcg by mouth daily.    . Vitamin D, Ergocalciferol, (DRISDOL) 50000 units CAPS capsule TAKE 1 CAPSULE BY MOUTH EVERY 7 DAYS (Patient taking differently: Take 50,000 Units by mouth every 7 (seven) days. ) 12 capsule 0  . zonisamide (ZONEGRAN) 100 MG capsule TAKE 1 CAPSULE(100 MG) BY MOUTH DAILY 90 capsule 1   No current facility-administered medications on file prior to visit.    Observations/Objective: Alert, NAD, appropriate mood and affect, resps normal, cn 2-12 intact, moves all 4s, no visible rash or swelling Lab Results  Component Value Date   WBC 6.2 04/06/2019   HGB 13.1 04/06/2019   HCT 39.4 04/06/2019   PLT 258.0 04/06/2019   GLUCOSE 159 (H) 04/06/2019   CHOL 138 04/06/2019   TRIG 109.0 04/06/2019   HDL 45.90 04/06/2019   LDLDIRECT 135.0 07/05/2011   LDLCALC 70 04/06/2019   ALT 10 04/06/2019   AST 13 04/06/2019   NA 140 04/06/2019   K 3.6 04/06/2019   CL 109 04/06/2019   CREATININE 0.81 04/06/2019   BUN 9 04/06/2019   CO2 21 04/06/2019   TSH 1.09 12/29/2018   INR 1.3 11/17/2012   HGBA1C 6.5 04/06/2019   MICROALBUR <0.7 12/29/2018   Assessment and Plan: See notes  Follow Up Instructions: See notes   I discussed the assessment and treatment plan with the patient. The patient was provided an opportunity to ask questions and all were answered. The patient agreed with the plan and demonstrated an understanding of the instructions.   The patient was advised to call back or seek an in-person evaluation if the symptoms worsen or if the condition fails to improve as anticipated.   Cathlean Cower, MD

## 2019-05-10 ENCOUNTER — Encounter: Payer: Self-pay | Admitting: Internal Medicine

## 2019-05-13 ENCOUNTER — Encounter: Payer: Self-pay | Admitting: Internal Medicine

## 2019-05-13 NOTE — Assessment & Plan Note (Signed)
stable overall by history and exam, recent data reviewed with pt, and pt to continue medical treatment as before,  to f/u any worsening symptoms or concerns, for neuro f/u as planned

## 2019-05-13 NOTE — Assessment & Plan Note (Signed)
stable overall by history and exam, recent data reviewed with pt, and pt to continue medical treatment as before,  to f/u any worsening symptoms or concerns  

## 2019-05-13 NOTE — Assessment & Plan Note (Addendum)
Now s/p recent right side ablation apparently complicated by RLE weakness and persistent pain; I told pt I would send a note to neurology, but may be best served by contacting the proceduralist; gave note for off work to next [employer]

## 2019-05-16 ENCOUNTER — Other Ambulatory Visit: Payer: Self-pay | Admitting: Pain Medicine

## 2019-05-16 DIAGNOSIS — M47817 Spondylosis without myelopathy or radiculopathy, lumbosacral region: Secondary | ICD-10-CM | POA: Diagnosis not present

## 2019-05-16 DIAGNOSIS — M7918 Myalgia, other site: Secondary | ICD-10-CM | POA: Diagnosis not present

## 2019-05-16 DIAGNOSIS — M545 Low back pain, unspecified: Secondary | ICD-10-CM

## 2019-05-16 DIAGNOSIS — M069 Rheumatoid arthritis, unspecified: Secondary | ICD-10-CM | POA: Diagnosis not present

## 2019-05-16 DIAGNOSIS — Z79891 Long term (current) use of opiate analgesic: Secondary | ICD-10-CM | POA: Diagnosis not present

## 2019-05-16 DIAGNOSIS — Z79899 Other long term (current) drug therapy: Secondary | ICD-10-CM | POA: Diagnosis not present

## 2019-05-16 DIAGNOSIS — G894 Chronic pain syndrome: Secondary | ICD-10-CM | POA: Diagnosis not present

## 2019-05-16 MED ORDER — BUDESONIDE-FORMOTEROL FUMARATE 160-4.5 MCG/ACT IN AERO: 2.0000 | INHALATION_SPRAY | Freq: Two times a day (BID) | RESPIRATORY_TRACT | 5 refills | Status: DC

## 2019-05-21 ENCOUNTER — Telehealth: Payer: Self-pay | Admitting: Emergency Medicine

## 2019-05-21 MED ORDER — MOMETASONE FURO-FORMOTEROL FUM 200-5 MCG/ACT IN AERO: 2.0000 | INHALATION_SPRAY | Freq: Two times a day (BID) | RESPIRATORY_TRACT | 5 refills | Status: DC

## 2019-05-21 NOTE — Telephone Encounter (Signed)
Pt has been informed via email; see encounter for more information. Will sign off.

## 2019-05-21 NOTE — Telephone Encounter (Signed)
Ruthe Mannan would be the most similar to Symbicort. Please change to Dulera 200. Thankjs

## 2019-05-21 NOTE — Telephone Encounter (Signed)
I have sent Rx for Dulera 200 to pt's pharmacy and have removed Symbicort from pt's med list due to it not being covered by insurance.  Attempted to call pt to let her know about this but unable to reach pt. Left message for pt to return call so we can go over inhaler switch with her.

## 2019-05-21 NOTE — Telephone Encounter (Signed)
Called [facility] and the tech confirmed pt received the Symbicort on 7/23 for $25. The tech is unable to run the Symbicort to see if it rejects because it was just filled and would reject regardless. She requests the pt call 3 days prior to needing the refill to see if the Symbicort is covered or not. Email has been sent to pt to inform her. Nothing further needed at this time.

## 2019-05-21 NOTE — Telephone Encounter (Signed)
Received a fax from [place] stating the pt's insurance will not cover Symbicort. Covered alternatives are Advair HFA, Breo or Dulera.  RB - please advise. Thanks.

## 2019-05-22 DIAGNOSIS — M545 Low back pain: Secondary | ICD-10-CM | POA: Diagnosis not present

## 2019-05-22 DIAGNOSIS — M79609 Pain in unspecified limb: Secondary | ICD-10-CM | POA: Diagnosis not present

## 2019-05-28 ENCOUNTER — Encounter: Payer: Self-pay | Admitting: Internal Medicine

## 2019-06-01 ENCOUNTER — Encounter: Payer: Self-pay | Admitting: Internal Medicine

## 2019-06-08 NOTE — Telephone Encounter (Signed)
Patient has dropped off forms to be completed for leave starting on 07/16 to 08/05 for back pain, Please advise if you approve. Thank you.

## 2019-06-09 ENCOUNTER — Ambulatory Visit
Admission: RE | Admit: 2019-06-09 | Discharge: 2019-06-09 | Disposition: A | Discharge status: Home / Self Care | Payer: BC Managed Care – PPO | Source: Ambulatory Visit | Attending: Pain Medicine | Admitting: Pain Medicine

## 2019-06-09 ENCOUNTER — Other Ambulatory Visit: Payer: Self-pay

## 2019-06-09 DIAGNOSIS — M48061 Spinal stenosis, lumbar region without neurogenic claudication: Secondary | ICD-10-CM | POA: Diagnosis not present

## 2019-06-09 DIAGNOSIS — M545 Low back pain, unspecified: Secondary | ICD-10-CM

## 2019-06-11 ENCOUNTER — Ambulatory Visit (INDEPENDENT_AMBULATORY_CARE_PROVIDER_SITE_OTHER): Payer: Self-pay | Admitting: Bariatrics

## 2019-06-11 NOTE — Telephone Encounter (Signed)
Forms have been faxed to [facility], Copy sent to scan.   Patient informed & Original mailed to her.

## 2019-06-11 NOTE — Telephone Encounter (Signed)
Forms have been completed for those dates. Placed in providers box to review and sign.

## 2019-06-12 ENCOUNTER — Telehealth: Payer: Self-pay | Admitting: Internal Medicine

## 2019-06-12 NOTE — Telephone Encounter (Signed)
ROI fax to [facility] for records.

## 2019-06-14 NOTE — Telephone Encounter (Signed)
Rec'd from [facility] forwarded 12 pages to Dr. Cora Daniels

## 2019-06-18 ENCOUNTER — Ambulatory Visit (INDEPENDENT_AMBULATORY_CARE_PROVIDER_SITE_OTHER): Payer: Self-pay | Admitting: Psychology

## 2019-06-18 DIAGNOSIS — M7918 Myalgia, other site: Secondary | ICD-10-CM | POA: Diagnosis not present

## 2019-06-18 DIAGNOSIS — M47817 Spondylosis without myelopathy or radiculopathy, lumbosacral region: Secondary | ICD-10-CM | POA: Diagnosis not present

## 2019-06-18 DIAGNOSIS — M069 Rheumatoid arthritis, unspecified: Secondary | ICD-10-CM | POA: Diagnosis not present

## 2019-06-18 DIAGNOSIS — G894 Chronic pain syndrome: Secondary | ICD-10-CM | POA: Diagnosis not present

## 2019-06-25 ENCOUNTER — Encounter: Payer: Self-pay | Admitting: Neurology

## 2019-06-25 ENCOUNTER — Telehealth: Payer: BC Managed Care – PPO | Admitting: Neurology

## 2019-06-25 ENCOUNTER — Other Ambulatory Visit: Payer: Self-pay

## 2019-06-25 ENCOUNTER — Ambulatory Visit (INDEPENDENT_AMBULATORY_CARE_PROVIDER_SITE_OTHER): Payer: Self-pay | Admitting: Bariatrics

## 2019-06-26 ENCOUNTER — Encounter: Payer: Self-pay | Admitting: Neurology

## 2019-06-26 ENCOUNTER — Other Ambulatory Visit: Payer: Self-pay

## 2019-06-26 ENCOUNTER — Ambulatory Visit (INDEPENDENT_AMBULATORY_CARE_PROVIDER_SITE_OTHER): Payer: BC Managed Care – PPO | Admitting: Neurology

## 2019-06-26 VITALS — BP 141/96 | HR 72 | Temp 98.1°F | Ht 67.0 in | Wt 343.0 lb

## 2019-06-26 DIAGNOSIS — M5416 Radiculopathy, lumbar region: Secondary | ICD-10-CM | POA: Diagnosis not present

## 2019-06-26 DIAGNOSIS — G43019 Migraine without aura, intractable, without status migrainosus: Secondary | ICD-10-CM | POA: Diagnosis not present

## 2019-06-26 MED ORDER — ZONISAMIDE 100 MG PO CAPS: 200.0000 mg | ORAL_CAPSULE | Freq: Every day | ORAL | 3 refills | Status: DC

## 2019-06-26 MED ORDER — RIZATRIPTAN BENZOATE 10 MG PO TBDP: ORAL_TABLET | ORAL | 3 refills | Status: DC

## 2019-06-26 NOTE — Progress Notes (Signed)
rescheduled

## 2019-06-26 NOTE — Patient Instructions (Signed)
1.  Increase zonisamide to 200mg  daily 2.  Stop sumatriptan.  Instead, take rizatriptan 10mg  at earliest onset of migraine.  May repeat once after 2 hours if needed (maximum 2 tablets in 24 hours) 3. Limit use of pain relievers to no more than 2 days out of week to prevent risk of rebound or medication-overuse headache. 4.  NCV-EMG of right leg 5.  Follow up in 3 to 4 months.

## 2019-06-26 NOTE — Progress Notes (Signed)
NEUROLOGY FOLLOW UP OFFICE NOTE  Nicole Melendez [id number]  HISTORY OF PRESENT ILLNESS: Nicole Melendez is a 43 year old female with diabetes, RA, sarcoidosis and chronic lung disease, whom I see for migraines, presents today for right sided lower extremity pain, numbness and weakness.  History supplemented by pain management notes.  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  On 03/14/19, she underwent radiofrequency neurolysis of the left lumbar facet nerves at L2-L3, L3-L4, L4-L5 and L5-S1 levels.  She had no complications.  On 04/23/19, she underwent the same procedure on the right side at the same levels. She noted some right leg weakness afterwards with some pain.  It progressively got worse.  When she is on her feet, the right lower back pain increases and her leg gives out but no leg pain.  No numbness in right leg but notes new numbness in the big toe of the left foot.  Due to the weakness, she started having falls.  She also notes that sometimes when wakes up in the morning, she has trouble making it to the bathroom to urinate.  She has had incontinence.  No perineal numbness.  She now resorts to using a cane to ambulate.  She had NCV-EMG of the lower extremities on 05/22/19, which demonstrated diminished amplitude of both tibaial motor nerves and absent F-wave response in the right peroneal motor nerve but otherwise unremarkable with no explanation for symptoms.  MRI of lumbar spine without contrast on 06/09/19 personally reviewed showed stable degenerative disc disease and facet arthropathy with mild bilateral neuroforaminal stenosis at L4-L5 and moderate neuroforaminal stenosis at L5-S1, but no new structural explanation for symptoms.  Since this started, she reports increased migraines.  They are occurring once a week and lasting two days (one time it lasted 5 days).   PAST MEDICAL HISTORY: Past Medical History:  Diagnosis Date  . Allergic rhinitis, cause  unspecified   . Anemia   . Anxiety   . Depression   . Diabetes mellitus type II    steroid related, patient states "im no longer diabetic"  . Essential hypertension 08/08/2015  . GERD (gastroesophageal reflux disease)   . History of blood transfusion   . Hyperlipidemia   . Menorrhagia   . Migraine   . Morbid obesity ([place])   . Otitis media 06/28/2015  . Peptic ulcer disease   . Positive ANA (antinuclear antibody) 02/14/2012  . Sarcoid    including hand per rheumatology-Dr. Amil Amen  . Sarcoidosis of lung ([place])   . Shortness of breath    on exertion  . Varicose veins with pain     MEDICATIONS: Current Outpatient Medications on File Prior to Visit  Medication Sig Dispense Refill  . acetaminophen (TYLENOL) 500 MG tablet Take 1 tablet (500 mg total) by mouth every 6 (six) hours as needed. 30 tablet 0  . albuterol (PROVENTIL HFA;VENTOLIN HFA) 108 (90 Base) MCG/ACT inhaler Inhale 2 puffs into the lungs every 4 (four) hours as needed for wheezing or shortness of breath. 1 Inhaler 2  . ALPRAZolam (XANAX) 0.5 MG tablet TAKE 1 TABLET(0.5 MG) BY MOUTH THREE TIMES DAILY AS NEEDED 90 tablet 2  . azelastine (ASTELIN) 0.1 % nasal spray Place 2 sprays into both nostrils 2 (two) times daily. Use in each nostril as directed 30 mL 12  . Blood Glucose Monitoring Suppl (ONE TOUCH ULTRA 2) W/DEVICE KIT Use as directed four times daily. Dx: E11.9 1 each 0  . cetirizine (ZYRTEC)  10 MG tablet TAKE 1 TABLET(10 MG) BY MOUTH DAILY 90 tablet 1  . Diclofenac Sodium (PENNSAID) 2 % SOLN Apply 1 pump twice daily as needed. (Patient not taking: Reported on 05/04/2019) 112 g 3  . fluticasone (FLONASE) 50 MCG/ACT nasal spray Place 2 sprays into both nostrils daily. 16 g 0  . furosemide (LASIX) 40 MG tablet 1 tab by mouth every day in the AM and then 1 as needed only for persistent swelling in the PM 60 tablet 11  . gabapentin (NEURONTIN) 300 MG capsule TAKE 2 CAPSULES(600 MG) BY MOUTH THREE TIMES DAILY 180 capsule 5  .  glucose blood (ONE TOUCH ULTRA TEST) test strip Use to check blood sugars four times a day Dx E11.9 400 each 11  . hydrOXYzine (ATARAX/VISTARIL) 10 MG tablet TAKE 1 TABLET(10 MG) BY MOUTH THREE TIMES DAILY AS NEEDED (Patient taking differently: Take 10 mg by mouth 3 (three) times daily as needed for anxiety. ) 60 tablet 0  . insulin lispro (HUMALOG) 100 UNIT/ML injection INJECT UP TO 12 UNITS INTO THE SKIN THREE TIMES DAILY WITH MEALS WITH SLIDING SCALE AS DIRECTED 20 mL 11  . Insulin Syringe-Needle U-100 25G X 1" 1 ML MISC Use to administer humalog insulin three times a day Dx E11.9 100 each 5  . levalbuterol (XOPENEX HFA) 45 MCG/ACT inhaler Inhale 1-2 puffs into the lungs every 8 (eight) hours as needed for wheezing or shortness of breath. USE 2 PUFFS 3 TIMES DAILY X 5 DAYS THEN BACK TO HOME REGIMEN AS NEEDED. (Patient not taking: Reported on 05/04/2019) 1 Inhaler 0  . lidocaine (LIDODERM) 5 % Place 1 patch onto the skin daily. Remove & Discard patch within 12 hours or as directed by MD 60 patch 2  . mometasone-formoterol (DULERA) 200-5 MCG/ACT AERO Inhale 2 puffs into the lungs 2 (two) times a day. 8.8 g 5  . omeprazole (PRILOSEC) 40 MG capsule TAKE 1 CAPSULE(40 MG) BY MOUTH DAILY 90 capsule 1  . ONE TOUCH ULTRA TEST test strip TEST BLOOD SUGAR FOUR TIMES DAILY 100 each 0  . oxyCODONE-acetaminophen (PERCOCET) 10-325 MG tablet Take 1 tablet by mouth every 8 (eight) hours as needed for pain.   0  . potassium chloride (K-DUR) 10 MEQ tablet 3 tab by mouth per day only when taking the lasix 90 tablet 11  . rosuvastatin (CRESTOR) 40 MG tablet TAKE 1 TABLET(40 MG) BY MOUTH DAILY 90 tablet 1  . sertraline (ZOLOFT) 100 MG tablet TAKE 2 TABLETS(200 MG) BY MOUTH DAILY 180 tablet 1  . SUMAtriptan (IMITREX) 100 MG tablet Take 1 tablet by mouth at the onset of a headache. May repeat in 2 hours if headache persists or recurs. 10 tablet 2  . telmisartan (MICARDIS) 40 MG tablet TAKE 1 TABLET(40 MG) BY MOUTH DAILY  (Patient not taking: No sig reported) 90 tablet 0  . TURMERIC PO Take 1 tablet by mouth daily.    . vitamin B-12 (CYANOCOBALAMIN) 1000 MCG tablet Take 1,000 mcg by mouth daily.    . Vitamin D, Ergocalciferol, (DRISDOL) 50000 units CAPS capsule TAKE 1 CAPSULE BY MOUTH EVERY 7 DAYS (Patient taking differently: Take 50,000 Units by mouth every 7 (seven) days. ) 12 capsule 0  . zonisamide (ZONEGRAN) 100 MG capsule TAKE 1 CAPSULE(100 MG) BY MOUTH DAILY 90 capsule 1   No current facility-administered medications on file prior to visit.     ALLERGIES: Allergies  Allergen Reactions  . Azithromycin Hives    (z pak) hives  .  Bee Venom Anaphylaxis  . Flagyl [Metronidazole] Hives and Shortness Of Breath  . Penicillins Shortness Of Breath and Swelling    Has patient had a PCN reaction causing immediate rash, facial/tongue/throat swelling, SOB or lightheadedness with hypotension: Yes Has patient had a PCN reaction causing severe rash involving mucus membranes or skin necrosis: No Has patient had a PCN reaction that required hospitalization No Has patient had a PCN reaction occurring within the last 10 years: No If all of the above answers are "NO", then may proceed with Cephalosporin use.  REACTION: swelling and difficulty breathing  . Shellfish Allergy Anaphylaxis  . Klonopin [Clonazepam] Other (See Comments)    Memory difficulty    FAMILY HISTORY: Family History  Problem Relation Age of Onset  . Allergies Mother   . Heart attack Mother   . Diabetes Mother   . Diabetes Father   . Heart disease Father   . Rheum arthritis Father   . Stroke Father   . Hypertension Other   . Ovarian cancer Maternal Aunt   . Lung cancer Maternal Aunt   . Breast cancer Maternal Aunt   . Bone cancer Maternal Aunt   . Stroke Maternal Uncle   . Other Neg Hx     SOCIAL HISTORY: Social History   Socioeconomic History  . Marital status: Single    Spouse name: Not on file  . Number of children: 0  .  Years of education: Not on file  . Highest education level: Not on file  Occupational History  . Occupation: Therapist, art  Social Needs  . Financial resource strain: Not on file  . Food insecurity    Worry: Not on file    Inability: Not on file  . Transportation needs    Medical: Not on file    Non-medical: Not on file  Tobacco Use  . Smoking status: Former Smoker    Packs/day: 1.00    Years: 20.00    Pack years: 20.00    Types: Cigarettes    Quit date: 10/25/2012    Years since quitting: 6.6  . Smokeless tobacco: Never Used  . Tobacco comment: QUIT 04/2010 AND STARTED BACK 2014 X 3 MONTHS. less than 1 ppd.  started at age 60.    Substance and Sexual Activity  . Alcohol use: No  . Drug use: No  . Sexual activity: Yes    Birth control/protection: None  Lifestyle  . Physical activity    Days per week: Not on file    Minutes per session: Not on file  . Stress: Not on file  Relationships  . Social Herbalist on phone: Not on file    Gets together: Not on file    Attends religious service: Not on file    Active member of club or organization: Not on file    Attends meetings of clubs or organizations: Not on file    Relationship status: Not on file  . Intimate partner violence    Fear of current or ex partner: Not on file    Emotionally abused: Not on file    Physically abused: Not on file    Forced sexual activity: Not on file  Other Topics Concern  . Not on file  Social History Narrative   Pt lives with [name]- also pt of [place]      Has 2 bachelors/1 masters; is working; R handed - one level home; R handed; rare caffeine;  REVIEW OF SYSTEMS: Constitutional: No fevers, chills, or sweats, no generalized fatigue, change in appetite Eyes: No visual changes, double vision, eye pain Ear, nose and throat: No hearing loss, ear pain, nasal congestion, sore throat Cardiovascular: No chest pain, palpitations Respiratory:  No shortness of breath at rest  or with exertion, wheezes GastrointestinaI: No nausea, vomiting, diarrhea, abdominal pain, fecal incontinence Genitourinary:  No dysuria, urinary retention or frequency Musculoskeletal:  Back pain Integumentary: No rash, pruritus, skin lesions Neurological: as above Psychiatric: No depression, insomnia, anxiety Endocrine: No palpitations, fatigue, diaphoresis, mood swings, change in appetite, change in weight, increased thirst Hematologic/Lymphatic:  No purpura, petechiae. Allergic/Immunologic: no itchy/runny eyes, nasal congestion, recent allergic reactions, rashes  PHYSICAL EXAM: Blood pressure (!) 141/96, pulse 72, temperature 98.1 F (36.7 C), height '5\' 7"'  (1.702 m), weight (!) 343 lb (155.6 kg), last menstrual period 07/17/2012, SpO2 96 %. General: No acute distress.  Patient appears well-groomed.   Head:  Normocephalic/atraumatic Eyes:  Fundi examined but not visualized Neck: supple, no paraspinal tenderness, full range of motion Heart:  Regular rate and rhythm Lungs:  Clear to auscultation bilaterally Back: Right lower paraspinal tenderness Neurological Exam: alert and oriented to person, place, and time. Attention span and concentration intact, recent and remote memory intact, fund of knowledge intact.  Speech fluent and not dysarthric, language intact.  CN II-XII intact. Bulk and tone normal, muscle strength 5/5 throughout.  Sensation to pinprick reduced in right lower extremity involving anterior leg and dorsum of foot.  Vibratory sensation reduced in right lower extremity.  Deep tendon reflexes brisk in upper extremities, 2+ lower extremities, Hoffman absent, toes downgoing.  Finger to nose and heel to shin testing intact.  Right limp/antalgic gait.  Romberg negative.  IMPRESSION: 1.  Right lower extremity pain and weakness following radiofrequency neurolysis.  No actual objective weakness on exam.  It appears mostly to be related to low back pain/lumbar radicular pain.  She does  exhibit some hyperreflexia in the upper extremities, however no pathological upper motor neuron signs.  History of description of her right leg would appear consistent with involvement of the lumbar spine. 2.  Migraine without aura, without status migrainosus, now intractable with increased frequency.  PLAN: 1.  We will repeat NCV-EMG of right lower extremity. Further recommendations pending results. 2.  Increase zonisamide to 220m daily 3.  Change abortive migraine therapy from sumatriptan to rizatriptan 13m4.  Limit use of pain relievers to no more than 2 days out of week to prevent risk of rebound or medication-overuse headache. 5.  Follow up in 3 to 4 months.   AdMetta ClinesDO  CC:  JaDereck LeepMD  DaDian SituMD

## 2019-07-06 ENCOUNTER — Ambulatory Visit: Payer: Self-pay | Admitting: Internal Medicine

## 2019-07-09 DIAGNOSIS — M069 Rheumatoid arthritis, unspecified: Secondary | ICD-10-CM | POA: Diagnosis not present

## 2019-07-09 DIAGNOSIS — M47817 Spondylosis without myelopathy or radiculopathy, lumbosacral region: Secondary | ICD-10-CM | POA: Diagnosis not present

## 2019-07-09 DIAGNOSIS — M7918 Myalgia, other site: Secondary | ICD-10-CM | POA: Diagnosis not present

## 2019-07-09 DIAGNOSIS — M545 Low back pain: Secondary | ICD-10-CM | POA: Diagnosis not present

## 2019-07-13 ENCOUNTER — Ambulatory Visit: Payer: BC Managed Care – PPO | Admitting: Internal Medicine

## 2019-07-13 ENCOUNTER — Ambulatory Visit (INDEPENDENT_AMBULATORY_CARE_PROVIDER_SITE_OTHER)
Admission: RE | Admit: 2019-07-13 | Discharge: 2019-07-13 | Disposition: A | Discharge status: Home / Self Care | Payer: BC Managed Care – PPO | Source: Ambulatory Visit

## 2019-07-13 ENCOUNTER — Other Ambulatory Visit: Payer: Self-pay

## 2019-07-13 DIAGNOSIS — M545 Low back pain: Secondary | ICD-10-CM | POA: Diagnosis not present

## 2019-07-13 DIAGNOSIS — G8929 Other chronic pain: Secondary | ICD-10-CM

## 2019-07-13 DIAGNOSIS — R112 Nausea with vomiting, unspecified: Secondary | ICD-10-CM | POA: Diagnosis not present

## 2019-07-13 DIAGNOSIS — G43709 Chronic migraine without aura, not intractable, without status migrainosus: Secondary | ICD-10-CM | POA: Diagnosis not present

## 2019-07-13 DIAGNOSIS — Z0289 Encounter for other administrative examinations: Secondary | ICD-10-CM

## 2019-07-13 MED ORDER — ONDANSETRON HCL 4 MG PO TABS: 4.0000 mg | ORAL_TABLET | Freq: Four times a day (QID) | ORAL | 0 refills | Status: DC

## 2019-07-13 MED ORDER — PREDNISONE 20 MG PO TABS: 20.0000 mg | ORAL_TABLET | Freq: Two times a day (BID) | ORAL | 0 refills | Status: AC

## 2019-07-13 NOTE — ED Provider Notes (Signed)
[place] Virtual Visit via Video Note:  Nicole Melendez  initiated request for Telemedicine visit with [facility] Urgent [facility] team. I connected with Nicole Melendez  on 07/13/2019 at 2:55 PM  for a synchronized telemedicine visit using a video enabled HIPPA compliant telemedicine application. I verified that I am speaking with Nicole Melendez  using two identifiers. Nicole Box, PA-C  was physically located in a [facility] Urgent care site and Nicole Melendez was located at a different location.   The limitations of evaluation and management by telemedicine as well as the availability of in-person appointments were discussed. Patient was informed that she  may incur a bill ( including co-pay) for this virtual visit encounter. Nicole Melendez  expressed understanding and gave verbal consent to proceed with virtual visit.   [id number] 07/13/19 Arrival Time: 6384  CC: Migraine; back pain  SUBJECTIVE: History from: patient. Nicole Melendez is a 43 y.o. female complains of persistent migraine x 4-5 days.  Denies precipitating event or head trauma.  Denies sick exposure to COVID, flu or strep.  Denies recent travel.  She localizes her pain to the back of head.  She describes the pain as constant and throbbing in character.  She has tried prescribed medication by neurologist without relief.  Her symptoms are made worse with sitting for long periods of time.  She does not described this as the worst headache of her life.    Patient also mentions intermittent chronic low back pain for the past few years.  Denies precipitating event or injury.  Had back injection 5 days ago with relief.  Describes pain as improving, intermittent and throbbing.  Has been taking prescribed medication, oxycodone, OTC pain patch with minimal relief.  Has tried low back exercises as well without relief.  Worse with sitting for long periods of time.    Complains of associated "fuzzy" vision, fatigue, nausea, 6  episodes of vomiting since 4 days ago, rhinorrhea, lightheadedness, and slight wheezing.  Denies fuzzy vision with past migraines, this is a new symptom.    Denies fever, chills, dizziness, chest pain, SOB, abdominal pain, weakness, numbness, tingling, changes in bowel or bladder function, facial asymmetries, slurred speech.    ROS: As per HPI.  All other pertinent ROS negative.     Past Medical History:  Diagnosis Date  . Allergic rhinitis, cause unspecified   . Anemia   . Anxiety   . Depression   . Diabetes mellitus type II    steroid related, patient states "im no longer diabetic"  . Essential hypertension 08/08/2015  . GERD (gastroesophageal reflux disease)   . History of blood transfusion   . Hyperlipidemia   . Menorrhagia   . Migraine   . Morbid obesity ([place])   . Otitis media 06/28/2015  . Peptic ulcer disease   . Positive ANA (antinuclear antibody) 02/14/2012  . Sarcoid    including hand per rheumatology-Dr. Amil Amen  . Sarcoidosis of lung ([place])   . Shortness of breath    on exertion  . Varicose veins with pain    Past Surgical History:  Procedure Laterality Date  . ABDOMINAL HYSTERECTOMY    . COLONOSCOPY WITH PROPOFOL N/A 10/19/2016   Procedure: COLONOSCOPY WITH PROPOFOL;  Surgeon: Mauri Pole, MD;  Location: [facility] ENDOSCOPY;  Service: Endoscopy;  Laterality: N/A;  . ESOPHAGOGASTRODUODENOSCOPY (EGD) WITH PROPOFOL N/A 10/19/2016   Procedure: ESOPHAGOGASTRODUODENOSCOPY (EGD) WITH PROPOFOL;  Surgeon: Mauri Pole, MD;  Location: [facility] ENDOSCOPY;  Service: Endoscopy;  Laterality: N/A;  . HERNIA MESH REMOVAL  02/2013  . uterine ablation  03/2010  . WISDOM TOOTH EXTRACTION     Allergies  Allergen Reactions  . Azithromycin Hives    (z pak) hives  . Bee Venom Anaphylaxis  . Flagyl [Metronidazole] Hives and Shortness Of Breath  . Penicillins Shortness Of Breath and Swelling    Has patient had a PCN reaction causing immediate rash, facial/tongue/throat swelling, SOB  or lightheadedness with hypotension: Yes Has patient had a PCN reaction causing severe rash involving mucus membranes or skin necrosis: No Has patient had a PCN reaction that required hospitalization No Has patient had a PCN reaction occurring within the last 10 years: No If all of the above answers are "NO", then may proceed with Cephalosporin use.  REACTION: swelling and difficulty breathing  . Shellfish Allergy Anaphylaxis  . Klonopin [Clonazepam] Other (See Comments)    Memory difficulty   No current facility-administered medications on file prior to encounter.    Current Outpatient Medications on File Prior to Encounter  Medication Sig Dispense Refill  . acetaminophen (TYLENOL) 500 MG tablet Take 1 tablet (500 mg total) by mouth every 6 (six) hours as needed. 30 tablet 0  . albuterol (PROVENTIL HFA;VENTOLIN HFA) 108 (90 Base) MCG/ACT inhaler Inhale 2 puffs into the lungs every 4 (four) hours as needed for wheezing or shortness of breath. 1 Inhaler 2  . ALPRAZolam (XANAX) 0.5 MG tablet TAKE 1 TABLET(0.5 MG) BY MOUTH THREE TIMES DAILY AS NEEDED 90 tablet 2  . azelastine (ASTELIN) 0.1 % nasal spray Place 2 sprays into both nostrils 2 (two) times daily. Use in each nostril as directed 30 mL 12  . Blood Glucose Monitoring Suppl (ONE TOUCH ULTRA 2) W/DEVICE KIT Use as directed four times daily. Dx: E11.9 1 each 0  . cetirizine (ZYRTEC) 10 MG tablet TAKE 1 TABLET(10 MG) BY MOUTH DAILY 90 tablet 1  . Diclofenac Sodium (PENNSAID) 2 % SOLN Apply 1 pump twice daily as needed. (Patient not taking: Reported on 05/04/2019) 112 g 3  . fluticasone (FLONASE) 50 MCG/ACT nasal spray Place 2 sprays into both nostrils daily. 16 g 0  . furosemide (LASIX) 40 MG tablet 1 tab by mouth every day in the AM and then 1 as needed only for persistent swelling in the PM 60 tablet 11  . gabapentin (NEURONTIN) 300 MG capsule TAKE 2 CAPSULES(600 MG) BY MOUTH THREE TIMES DAILY 180 capsule 5  . glucose blood (ONE TOUCH  ULTRA TEST) test strip Use to check blood sugars four times a day Dx E11.9 400 each 11  . hydrOXYzine (ATARAX/VISTARIL) 10 MG tablet TAKE 1 TABLET(10 MG) BY MOUTH THREE TIMES DAILY AS NEEDED (Patient taking differently: Take 10 mg by mouth 3 (three) times daily as needed for anxiety. ) 60 tablet 0  . insulin lispro (HUMALOG) 100 UNIT/ML injection INJECT UP TO 12 UNITS INTO THE SKIN THREE TIMES DAILY WITH MEALS WITH SLIDING SCALE AS DIRECTED 20 mL 11  . Insulin Syringe-Needle U-100 25G X 1" 1 ML MISC Use to administer humalog insulin three times a day Dx E11.9 100 each 5  . levalbuterol (XOPENEX HFA) 45 MCG/ACT inhaler Inhale 1-2 puffs into the lungs every 8 (eight) hours as needed for wheezing or shortness of breath. USE 2 PUFFS 3 TIMES DAILY X 5 DAYS THEN BACK TO HOME REGIMEN AS NEEDED. (Patient not taking: Reported on 05/04/2019) 1 Inhaler 0  . lidocaine (LIDODERM) 5 % Place 1 patch  onto the skin daily. Remove & Discard patch within 12 hours or as directed by MD 60 patch 2  . mometasone-formoterol (DULERA) 200-5 MCG/ACT AERO Inhale 2 puffs into the lungs 2 (two) times a day. 8.8 g 5  . omeprazole (PRILOSEC) 40 MG capsule TAKE 1 CAPSULE(40 MG) BY MOUTH DAILY 90 capsule 1  . ONE TOUCH ULTRA TEST test strip TEST BLOOD SUGAR FOUR TIMES DAILY 100 each 0  . oxyCODONE-acetaminophen (PERCOCET) 10-325 MG tablet Take 1 tablet by mouth every 8 (eight) hours as needed for pain.   0  . potassium chloride (K-DUR) 10 MEQ tablet 3 tab by mouth per day only when taking the lasix 90 tablet 11  . rizatriptan (MAXALT-MLT) 10 MG disintegrating tablet Take 1 tablet earliest onset of migraine.  May repeat in 2 hours if needed.  Maximum 2 tablets in 24 hours 9 tablet 3  . rosuvastatin (CRESTOR) 40 MG tablet TAKE 1 TABLET(40 MG) BY MOUTH DAILY 90 tablet 1  . sertraline (ZOLOFT) 100 MG tablet TAKE 2 TABLETS(200 MG) BY MOUTH DAILY 180 tablet 1  . SUMAtriptan (IMITREX) 100 MG tablet Take 1 tablet by mouth at the onset of a  headache. May repeat in 2 hours if headache persists or recurs. 10 tablet 2  . telmisartan (MICARDIS) 40 MG tablet TAKE 1 TABLET(40 MG) BY MOUTH DAILY (Patient not taking: No sig reported) 90 tablet 0  . TURMERIC PO Take 1 tablet by mouth daily.    . vitamin B-12 (CYANOCOBALAMIN) 1000 MCG tablet Take 1,000 mcg by mouth daily.    . Vitamin D, Ergocalciferol, (DRISDOL) 50000 units CAPS capsule TAKE 1 CAPSULE BY MOUTH EVERY 7 DAYS (Patient taking differently: Take 50,000 Units by mouth every 7 (seven) days. ) 12 capsule 0  . zonisamide (ZONEGRAN) 100 MG capsule Take 2 capsules (200 mg total) by mouth daily. 60 capsule 3    OBJECTIVE:  There were no vitals filed for this visit.  General appearance: alert; appears fatigued, but nontoxic; laying in bed Eyes: EOMI grossly HENT: normocephalic; atraumatic Neck: supple with FROM Lungs: normal respiratory effort; speaking in full sentences without difficulty Extremities: moves extremities without difficulty Skin: No obvious rashes Neurologic: No facial asymmetries Psychological: alert and cooperative; normal mood and affect   ASSESSMENT & PLAN:  1. Chronic migraine without aura without status migrainosus, not intractable   2. Non-intractable vomiting with nausea, unspecified vomiting type   3. Chronic right-sided low back pain without sciatica     Meds ordered this encounter  Medications  . predniSONE (DELTASONE) 20 MG tablet    Sig: Take 1 tablet (20 mg total) by mouth 2 (two) times daily with a meal for 5 days.    Dispense:  10 tablet    Refill:  0    Order Specific Question:   Supervising Provider    Answer:   Raylene Everts [[id number]]  . ondansetron (ZOFRAN) 4 MG tablet    Sig: Take 1 tablet (4 mg total) by mouth every 6 (six) hours.    Dispense:  12 tablet    Refill:  0    Order Specific Question:   Supervising Provider    Answer:   Raylene Everts [[id number]]   Recommended further evaluation and management in the ED for  migraine with "fuzzy vision," nausea, and vomiting x 5 days.  Patient states she has never had "fuzzy" vision with migraines in the past.  Patient declines at this time.  Aware of risks associated with this  decision including missed diagnosis such as stroke, tumor, or brain bleed resulting in permanent disability and/or death.  Patient aware and declines further work-up in the ED at this time.  Would like to try outpatient therapy first  Prednisone prescribed.  Take as directed and to completion for migraine and low back pain Zofran prescribed.  Take as needed for nausea and vomiting Rest and push fluids Follow up with PCP next week for recheck and to ensure your symptoms are improving Go to the ED if you have any new or worsening symptoms such as fever, worsening headache, worsening back pain, symptoms do not improve with medications, slurred speech, facial asymmetries, weakness in arms or legs, numbness or tingling, chest pain, shortness of breath, changes in bowel or bladder function, etc...   I discussed the assessment and treatment plan with the patient. The patient was provided an opportunity to ask questions and all were answered. The patient agreed with the plan and demonstrated an understanding of the instructions.   The patient was advised to call back or seek an in-person evaluation if the symptoms worsen or if the condition fails to improve as anticipated.  I provided 20 minutes of non-face-to-face time during this encounter.  [place], PA-C  07/13/2019 2:55 PM     Nicole Box, PA-C 07/13/19 [id number]

## 2019-07-20 ENCOUNTER — Other Ambulatory Visit: Payer: Self-pay | Admitting: *Deleted

## 2019-07-20 MED ORDER — ALPRAZOLAM 0.5 MG PO TABS: ORAL_TABLET | ORAL | 2 refills | Status: DC

## 2019-07-20 MED ORDER — ROSUVASTATIN CALCIUM 40 MG PO TABS: ORAL_TABLET | ORAL | 1 refills | Status: DC

## 2019-07-20 MED ORDER — SERTRALINE HCL 100 MG PO TABS: ORAL_TABLET | ORAL | 1 refills | Status: DC

## 2019-07-20 NOTE — Telephone Encounter (Signed)
[place] Controlled Database Checked Last filled: 06/20/19 # 90 LOV w/you: 05/09/19 Next appt w/you: None

## 2019-07-20 NOTE — Telephone Encounter (Signed)
Done erx 

## 2019-07-27 ENCOUNTER — Other Ambulatory Visit: Payer: Self-pay

## 2019-07-27 ENCOUNTER — Ambulatory Visit (INDEPENDENT_AMBULATORY_CARE_PROVIDER_SITE_OTHER): Payer: BC Managed Care – PPO | Admitting: Internal Medicine

## 2019-07-27 ENCOUNTER — Other Ambulatory Visit (INDEPENDENT_AMBULATORY_CARE_PROVIDER_SITE_OTHER): Payer: BC Managed Care – PPO

## 2019-07-27 ENCOUNTER — Encounter: Payer: Self-pay | Admitting: Internal Medicine

## 2019-07-27 VITALS — BP 128/84 | HR 82 | Temp 98.0°F | Ht 67.0 in | Wt 342.0 lb

## 2019-07-27 DIAGNOSIS — Z0001 Encounter for general adult medical examination with abnormal findings: Secondary | ICD-10-CM

## 2019-07-27 DIAGNOSIS — Z0000 Encounter for general adult medical examination without abnormal findings: Secondary | ICD-10-CM

## 2019-07-27 DIAGNOSIS — E538 Deficiency of other specified B group vitamins: Secondary | ICD-10-CM

## 2019-07-27 DIAGNOSIS — E611 Iron deficiency: Secondary | ICD-10-CM | POA: Diagnosis not present

## 2019-07-27 DIAGNOSIS — Z794 Long term (current) use of insulin: Secondary | ICD-10-CM | POA: Diagnosis not present

## 2019-07-27 DIAGNOSIS — E1140 Type 2 diabetes mellitus with diabetic neuropathy, unspecified: Secondary | ICD-10-CM | POA: Diagnosis not present

## 2019-07-27 DIAGNOSIS — Z23 Encounter for immunization: Secondary | ICD-10-CM

## 2019-07-27 DIAGNOSIS — E559 Vitamin D deficiency, unspecified: Secondary | ICD-10-CM

## 2019-07-27 DIAGNOSIS — G4700 Insomnia, unspecified: Secondary | ICD-10-CM | POA: Diagnosis not present

## 2019-07-27 DIAGNOSIS — M545 Low back pain, unspecified: Secondary | ICD-10-CM

## 2019-07-27 DIAGNOSIS — I10 Essential (primary) hypertension: Secondary | ICD-10-CM

## 2019-07-27 LAB — HEPATIC FUNCTION PANEL
ALT: 12 U/L (ref 0–35)
AST: 13 U/L (ref 0–37)
Albumin: 4.1 g/dL (ref 3.5–5.2)
Alkaline Phosphatase: 141 U/L — ABNORMAL HIGH (ref 39–117)
Bilirubin, Direct: 0.1 mg/dL (ref 0.0–0.3)
Total Bilirubin: 0.5 mg/dL (ref 0.2–1.2)
Total Protein: 7.4 g/dL (ref 6.0–8.3)

## 2019-07-27 LAB — CBC WITH DIFFERENTIAL/PLATELET
Basophils Absolute: 0.1 10*3/uL (ref 0.0–0.1)
Basophils Relative: 1.1 % (ref 0.0–3.0)
Eosinophils Absolute: 0.3 10*3/uL (ref 0.0–0.7)
Eosinophils Relative: 3.8 % (ref 0.0–5.0)
HCT: 39.4 % (ref 36.0–46.0)
Hemoglobin: 13.1 g/dL (ref 12.0–15.0)
Lymphocytes Relative: 25.4 % (ref 12.0–46.0)
Lymphs Abs: 1.7 10*3/uL (ref 0.7–4.0)
MCHC: 33.3 g/dL (ref 30.0–36.0)
MCV: 86.2 fl (ref 78.0–100.0)
Monocytes Absolute: 0.3 10*3/uL (ref 0.1–1.0)
Monocytes Relative: 4.3 % (ref 3.0–12.0)
Neutro Abs: 4.3 10*3/uL (ref 1.4–7.7)
Neutrophils Relative %: 65.4 % (ref 43.0–77.0)
Platelets: 276 10*3/uL (ref 150.0–400.0)
RBC: 4.57 Mil/uL (ref 3.87–5.11)
RDW: 14.2 % (ref 11.5–15.5)
WBC: 6.6 10*3/uL (ref 4.0–10.5)

## 2019-07-27 LAB — URINALYSIS, ROUTINE W REFLEX MICROSCOPIC
Bilirubin Urine: NEGATIVE
Hgb urine dipstick: NEGATIVE
Ketones, ur: NEGATIVE
Leukocytes,Ua: NEGATIVE
Nitrite: NEGATIVE
RBC / HPF: NONE SEEN (ref 0–?)
Specific Gravity, Urine: 1.02 (ref 1.000–1.030)
Total Protein, Urine: NEGATIVE
Urine Glucose: NEGATIVE
Urobilinogen, UA: 0.2 (ref 0.0–1.0)
WBC, UA: NONE SEEN (ref 0–?)
pH: 7 (ref 5.0–8.0)

## 2019-07-27 LAB — BASIC METABOLIC PANEL
BUN: 9 mg/dL (ref 6–23)
CO2: 25 mEq/L (ref 19–32)
Calcium: 9.5 mg/dL (ref 8.4–10.5)
Chloride: 107 mEq/L (ref 96–112)
Creatinine, Ser: 0.74 mg/dL (ref 0.40–1.20)
GFR: 102.44 mL/min (ref >60.00)
Glucose, Bld: 123 mg/dL — ABNORMAL HIGH (ref 70–99)
Potassium: 4 mEq/L (ref 3.5–5.1)
Sodium: 139 mEq/L (ref 135–145)

## 2019-07-27 LAB — LIPID PANEL
Cholesterol: 142 mg/dL (ref 0–200)
HDL: 47.8 mg/dL (ref >39.00)
LDL Cholesterol: 75 mg/dL (ref 0–99)
NonHDL: 94.6
Total CHOL/HDL Ratio: 3
Triglycerides: 98 mg/dL (ref 0.0–149.0)
VLDL: 19.6 mg/dL (ref 0.0–40.0)

## 2019-07-27 LAB — MICROALBUMIN / CREATININE URINE RATIO
Creatinine,U: 66.9 mg/dL
Microalb Creat Ratio: 1 mg/g (ref 0.0–30.0)
Microalb, Ur: <0.7 mg/dL (ref 0.0–1.9)

## 2019-07-27 LAB — IBC PANEL
Iron: 82 ug/dL (ref 42–145)
Saturation Ratios: 26 % (ref 20.0–50.0)
Transferrin: 225 mg/dL (ref 212.0–360.0)

## 2019-07-27 LAB — VITAMIN D 25 HYDROXY (VIT D DEFICIENCY, FRACTURES): VITD: 15.04 ng/mL — ABNORMAL LOW (ref 30.00–100.00)

## 2019-07-27 LAB — HEMOGLOBIN A1C: Hgb A1c MFr Bld: 6.7 % — ABNORMAL HIGH (ref 4.6–6.5)

## 2019-07-27 LAB — VITAMIN B12: Vitamin B-12: 317 pg/mL (ref 211–911)

## 2019-07-27 LAB — TSH: TSH: 1.6 u[IU]/mL (ref 0.35–4.50)

## 2019-07-27 MED ORDER — ZOLPIDEM TARTRATE 10 MG PO TABS: 10.0000 mg | ORAL_TABLET | Freq: Every evening | ORAL | 1 refills | Status: DC | PRN

## 2019-07-27 MED ORDER — HUMALOG KWIKPEN 200 UNIT/ML ~~LOC~~ SOPN: 12.0000 [IU] | PEN_INJECTOR | Freq: Four times a day (QID) | SUBCUTANEOUS | 11 refills | Status: DC

## 2019-07-27 NOTE — Assessment & Plan Note (Signed)
stable overall by history and exam, recent data reviewed with pt, and pt to continue medical treatment as before,  to f/u any worsening symptoms or concerns ble

## 2019-07-27 NOTE — Assessment & Plan Note (Signed)

## 2019-07-27 NOTE — Progress Notes (Signed)
Subjective:    Patient ID: Nicole Melendez, female    DOB: 1976/03/16, 43 y.o.   MRN: [id number]  HPI  Here for wellness and f/u;  Overall doing ok;  Pt denies Chest pain, worsening SOB, DOE, wheezing, orthopnea, PND, worsening LE edema, palpitations, dizziness or syncope.  Pt denies neurological change such as new headache, facial or extremity weakness.  Pt denies polydipsia, polyuria, or low sugar symptoms. Pt states overall good compliance with treatment and medications, good tolerability, and has been trying to follow appropriate diet.  Pt denies worsening depressive symptoms, suicidal ideation or panic. No fever, night sweats, wt loss, loss of appetite, or other constitutional symptoms.  Pt states good ability with ADL's, has low fall risk, home safety reviewed and adequate, no other significant changes in hearing or vision, and only occasionally active with exercise. Due for f;lu shot Also, now with worsening insomnia, had been better, now worse again as the work shifts change, for 3 wks, moderate to severe, daily consistent, just lies awake at home until the alarm goes off.   CBGs have been varialbe but in 100s just cant sem to get less than 150 with her new work schedule, asks to see Endo again Pt continues to have recurring LBP without change in severity, bowel or bladder change, fever, wt loss,  worsening LE pain/numbness/weakness, gait change or falls, recent MRI no change, and now pt leaning towards spinal stimulator Past Medical History:  Diagnosis Date  . Allergic rhinitis, cause unspecified   . Anemia   . Anxiety   . Depression   . Diabetes mellitus type II    steroid related, patient states "im no longer diabetic"  . Essential hypertension 08/08/2015  . GERD (gastroesophageal reflux disease)   . History of blood transfusion   . Hyperlipidemia   . Menorrhagia   . Migraine   . Morbid obesity ([place])   . Otitis media 06/28/2015  . Peptic ulcer disease   . Positive ANA (antinuclear  antibody) 02/14/2012  . Sarcoid    including hand per rheumatology-Dr. Amil Amen  . Sarcoidosis of lung ([place])   . Shortness of breath    on exertion  . Varicose veins with pain    Past Surgical History:  Procedure Laterality Date  . ABDOMINAL HYSTERECTOMY    . COLONOSCOPY WITH PROPOFOL N/A 10/19/2016   Procedure: COLONOSCOPY WITH PROPOFOL;  Surgeon: Mauri Pole, MD;  Location: [facility];  Service: Endoscopy;  Laterality: N/A;  . ESOPHAGOGASTRODUODENOSCOPY (EGD) WITH PROPOFOL N/A 10/19/2016   Procedure: ESOPHAGOGASTRODUODENOSCOPY (EGD) WITH PROPOFOL;  Surgeon: Mauri Pole, MD;  Location: [facility];  Service: Endoscopy;  Laterality: N/A;  . HERNIA MESH REMOVAL  02/2013  . uterine ablation  03/2010  . WISDOM TOOTH EXTRACTION      reports that she quit smoking about 6 years ago. Her smoking use included cigarettes. She has a 20.00 pack-year smoking history. She has never used smokeless tobacco. She reports that she does not drink alcohol or use drugs. family history includes Allergies in her mother; Bone cancer in her maternal aunt; Breast cancer in her maternal aunt; Diabetes in her father and mother; Heart attack in her mother; Heart disease in her father; Hypertension in an other family member; Lung cancer in her maternal aunt; Ovarian cancer in her maternal aunt; Rheum arthritis in her father; Stroke in her father and maternal uncle. Allergies  Allergen Reactions  . Azithromycin Hives    (z pak) hives  . Bee Venom Anaphylaxis  .  Flagyl [Metronidazole] Hives and Shortness Of Breath  . Penicillins Shortness Of Breath and Swelling    Has patient had a PCN reaction causing immediate rash, facial/tongue/throat swelling, SOB or lightheadedness with hypotension: Yes Has patient had a PCN reaction causing severe rash involving mucus membranes or skin necrosis: No Has patient had a PCN reaction that required hospitalization No Has patient had a PCN reaction occurring within the  last 10 years: No If all of the above answers are "NO", then may proceed with Cephalosporin use.  REACTION: swelling and difficulty breathing  . Shellfish Allergy Anaphylaxis  . Klonopin [Clonazepam] Other (See Comments)    Memory difficulty   Current Outpatient Medications on File Prior to Visit  Medication Sig Dispense Refill  . acetaminophen (TYLENOL) 500 MG tablet Take 1 tablet (500 mg total) by mouth every 6 (six) hours as needed. 30 tablet 0  . albuterol (PROVENTIL HFA;VENTOLIN HFA) 108 (90 Base) MCG/ACT inhaler Inhale 2 puffs into the lungs every 4 (four) hours as needed for wheezing or shortness of breath. 1 Inhaler 2  . ALPRAZolam (XANAX) 0.5 MG tablet TAKE 1 TABLET(0.5 MG) BY MOUTH THREE TIMES DAILY AS NEEDED 90 tablet 2  . azelastine (ASTELIN) 0.1 % nasal spray Place 2 sprays into both nostrils 2 (two) times daily. Use in each nostril as directed 30 mL 12  . cetirizine (ZYRTEC) 10 MG tablet TAKE 1 TABLET(10 MG) BY MOUTH DAILY 90 tablet 1  . Diclofenac Sodium (PENNSAID) 2 % SOLN Apply 1 pump twice daily as needed. 112 g 3  . fluticasone (FLONASE) 50 MCG/ACT nasal spray Place 2 sprays into both nostrils daily. 16 g 0  . furosemide (LASIX) 40 MG tablet 1 tab by mouth every day in the AM and then 1 as needed only for persistent swelling in the PM 60 tablet 11  . gabapentin (NEURONTIN) 300 MG capsule TAKE 2 CAPSULES(600 MG) BY MOUTH THREE TIMES DAILY 180 capsule 5  . hydrOXYzine (ATARAX/VISTARIL) 10 MG tablet TAKE 1 TABLET(10 MG) BY MOUTH THREE TIMES DAILY AS NEEDED (Patient taking differently: Take 10 mg by mouth 3 (three) times daily as needed for anxiety. ) 60 tablet 0  . levalbuterol (XOPENEX HFA) 45 MCG/ACT inhaler Inhale 1-2 puffs into the lungs every 8 (eight) hours as needed for wheezing or shortness of breath. USE 2 PUFFS 3 TIMES DAILY X 5 DAYS THEN BACK TO HOME REGIMEN AS NEEDED. 1 Inhaler 0  . lidocaine (LIDODERM) 5 % Place 1 patch onto the skin daily. Remove & Discard patch  within 12 hours or as directed by MD 60 patch 2  . mometasone-formoterol (DULERA) 200-5 MCG/ACT AERO Inhale 2 puffs into the lungs 2 (two) times a day. 8.8 g 5  . omeprazole (PRILOSEC) 40 MG capsule TAKE 1 CAPSULE(40 MG) BY MOUTH DAILY 90 capsule 1  . ondansetron (ZOFRAN) 4 MG tablet Take 1 tablet (4 mg total) by mouth every 6 (six) hours. 12 tablet 0  . ONE TOUCH ULTRA TEST test strip TEST BLOOD SUGAR FOUR TIMES DAILY 100 each 0  . oxyCODONE-acetaminophen (PERCOCET) 10-325 MG tablet Take 1 tablet by mouth every 8 (eight) hours as needed for pain.   0  . potassium chloride (K-DUR) 10 MEQ tablet 3 tab by mouth per day only when taking the lasix 90 tablet 11  . rizatriptan (MAXALT-MLT) 10 MG disintegrating tablet Take 1 tablet earliest onset of migraine.  May repeat in 2 hours if needed.  Maximum 2 tablets in 24 hours 9 tablet  3  . rosuvastatin (CRESTOR) 40 MG tablet TAKE 1 TABLET(40 MG) BY MOUTH DAILY 90 tablet 1  . sertraline (ZOLOFT) 100 MG tablet TAKE 2 TABLETS(200 MG) BY MOUTH DAILY 180 tablet 1  . telmisartan (MICARDIS) 40 MG tablet TAKE 1 TABLET(40 MG) BY MOUTH DAILY 90 tablet 0  . TURMERIC PO Take 1 tablet by mouth daily.    . vitamin B-12 (CYANOCOBALAMIN) 1000 MCG tablet Take 1,000 mcg by mouth daily.    . Vitamin D, Ergocalciferol, (DRISDOL) 50000 units CAPS capsule TAKE 1 CAPSULE BY MOUTH EVERY 7 DAYS (Patient taking differently: Take 50,000 Units by mouth every 7 (seven) days. ) 12 capsule 0  . zonisamide (ZONEGRAN) 100 MG capsule Take 2 capsules (200 mg total) by mouth daily. 60 capsule 3   No current facility-administered medications on file prior to visit.    Review of Systems Constitutional: Negative for other unusual diaphoresis, sweats, appetite or weight changes HENT: Negative for other worsening hearing loss, ear pain, facial swelling, mouth sores or neck stiffness.   Eyes: Negative for other worsening pain, redness or other visual disturbance.  Respiratory: Negative for other  stridor or swelling Cardiovascular: Negative for other palpitations or other chest pain  Gastrointestinal: Negative for worsening diarrhea or loose stools, blood in stool, distention or other pain Genitourinary: Negative for hematuria, flank pain or other change in urine volume.  Musculoskeletal: Negative for myalgias or other joint swelling.  Skin: Negative for other color change, or other wound or worsening drainage.  Neurological: Negative for other syncope or numbness. Hematological: Negative for other adenopathy or swelling Psychiatric/Behavioral: Negative for hallucinations, other worsening agitation, SI, self-injury, or new decreased concentration All otherwise neg per pt     Objective:   Physical Exam BP 128/84   Pulse 82   Temp 98 F (36.7 C) (Oral)   Ht 5\' 7"  (1.702 m)   Wt (!) 342 lb (155.1 kg)   LMP 07/17/2012   SpO2 97%   BMI 53.56 kg/m  VS noted,  Constitutional: Pt is oriented to person, place, and time. Appears well-developed and well-nourished, in no significant distress and comfortable Head: Normocephalic and atraumatic  Eyes: Conjunctivae and EOM are normal. Pupils are equal, round, and reactive to light Right Ear: External ear normal without discharge Left Ear: External ear normal without discharge Nose: Nose without discharge or deformity Mouth/Throat: Oropharynx is without other ulcerations and moist  Neck: Normal range of motion. Neck supple. No JVD present. No tracheal deviation present or significant neck LA or mass Cardiovascular: Normal rate, regular rhythm, normal heart sounds and intact distal pulses.   Pulmonary/Chest: WOB normal and breath sounds without rales or wheezing  Abdominal: Soft. Bowel sounds are normal. NT. No HSM  Musculoskeletal: Normal range of motion. Exhibits no edema Lymphadenopathy: Has no other cervical adenopathy.  Neurological: Pt is alert and oriented to person, place, and time. Pt has normal reflexes. No cranial nerve  deficit. Motor grossly intact, Gait intact Skin: Skin is warm and dry. No rash noted or new ulcerations Psychiatric:  Has normal mood and affect. Behavior is normal without agitation All otherwise neg per pt Lab Results  Component Value Date   WBC 6.2 04/06/2019   HGB 13.1 04/06/2019   HCT 39.4 04/06/2019   PLT 258.0 04/06/2019   GLUCOSE 159 (H) 04/06/2019   CHOL 138 04/06/2019   TRIG 109.0 04/06/2019   HDL 45.90 04/06/2019   LDLDIRECT 135.0 07/05/2011   LDLCALC 70 04/06/2019   ALT 10 04/06/2019  AST 13 04/06/2019   NA 140 04/06/2019   K 3.6 04/06/2019   CL 109 04/06/2019   CREATININE 0.81 04/06/2019   BUN 9 04/06/2019   CO2 21 04/06/2019   TSH 1.09 12/29/2018   INR 1.3 11/17/2012   HGBA1C 6.5 04/06/2019   MICROALBUR <0.7 12/29/2018       Assessment & Plan:

## 2019-07-27 NOTE — Assessment & Plan Note (Signed)
stable overall by history and exam, recent data reviewed with pt, and pt to continue medical treatment as before,  to f/u any worsening symptoms or concerns  

## 2019-07-27 NOTE — Assessment & Plan Note (Addendum)
Mod to severe, for ambien qhs prn,  to f/u any worsening symptoms or concerns  In addition to the time spent performing CPE, I spent an additional 25 minutes face to face,in which greater than 50% of this time was spent in counseling and coordination of care for patient's acute illness as documented, including the differential dx, treatment, further evaluation and other management of insomnia, low back pain, DM, HTN

## 2019-07-27 NOTE — Assessment & Plan Note (Signed)
Fair to good control, ok for endo f/u as requested

## 2019-07-27 NOTE — Patient Instructions (Addendum)
You had the flu shot today  Ok to change the bottle to the Humalog pen  You will be contacted regarding the referral for: Endocrinology - Dr Loanne Drilling  Please take all new medication as prescribed - the ambien for sleep  Please continue all other medications as before, and refills have been done if requested.  Please have the pharmacy call with any other refills you may need.  Please continue your efforts at being more active, low cholesterol diet, and weight control.  You are otherwise up to date with prevention measures today.  Please keep your appointments with your specialists as you may have planned  Please go to the LAB in the Basement (turn left off the elevator) for the tests to be done today  You will be contacted by phone if any changes need to be made immediately.  Otherwise, you will receive a letter about your results with an explanation, but please check with MyChart first.  Please remember to sign up for MyChart if you have not done so, as this will be important to you in the future with finding out test results, communicating by private email, and scheduling acute appointments online when needed.  Please return in 6 months, or sooner if needed

## 2019-07-31 ENCOUNTER — Ambulatory Visit (INDEPENDENT_AMBULATORY_CARE_PROVIDER_SITE_OTHER)
Admission: RE | Admit: 2019-07-31 | Discharge: 2019-07-31 | Disposition: A | Discharge status: Home / Self Care | Payer: BC Managed Care – PPO | Source: Ambulatory Visit

## 2019-07-31 ENCOUNTER — Encounter: Payer: Self-pay | Admitting: Internal Medicine

## 2019-07-31 DIAGNOSIS — K529 Noninfective gastroenteritis and colitis, unspecified: Secondary | ICD-10-CM | POA: Diagnosis not present

## 2019-07-31 MED ORDER — ONDANSETRON HCL 4 MG PO TABS: 4.0000 mg | ORAL_TABLET | Freq: Four times a day (QID) | ORAL | 0 refills | Status: DC

## 2019-07-31 NOTE — ED Provider Notes (Signed)
Virtual Visit via Video Note:  Nicole Melendez  initiated request for Telemedicine visit with [facility] team. I connected with Nicole Melendez  on 07/31/2019 at 12:45 PM  for a synchronized telemedicine visit using a video enabled HIPPA compliant telemedicine application. I verified that I am speaking with Nicole Melendez  using two identifiers. Nicole Balloon, NP  was physically located in a [facility] Urgent care site and Nicole Melendez was located at a different location.   The limitations of evaluation and management by telemedicine as well as the availability of in-person appointments were discussed. Patient was informed that she  may incur a bill ( including co-pay) for this virtual visit encounter. Nicole Melendez  expressed understanding and gave verbal consent to proceed with virtual visit.     History of Present Illness:Nicole Melendez  is a 43 y.o. female presents for evaluation of emesis and diarrhea x 3 days.  Treating at home with Imodium.  Denies fever, abdominal pain, dysuria, or other symptoms.  She reports having 3 episodes of emesis today and 5 episodes of diarrhea.  She is able to keep down sips of ginger ale.     Allergies  Allergen Reactions  . Azithromycin Hives    (z pak) hives  . Bee Venom Anaphylaxis  . Flagyl [Metronidazole] Hives and Shortness Of Breath  . Penicillins Shortness Of Breath and Swelling    Has patient had a PCN reaction causing immediate rash, facial/tongue/throat swelling, SOB or lightheadedness with hypotension: Yes Has patient had a PCN reaction causing severe rash involving mucus membranes or skin necrosis: No Has patient had a PCN reaction that required hospitalization No Has patient had a PCN reaction occurring within the last 10 years: No If all of the above answers are "NO", then may proceed with Cephalosporin use.  REACTION: swelling and difficulty breathing  . Shellfish Allergy Anaphylaxis  . Klonopin [Clonazepam] Other (See  Comments)    Memory difficulty     Past Medical History:  Diagnosis Date  . Allergic rhinitis, cause unspecified   . Anemia   . Anxiety   . Depression   . Diabetes mellitus type II    steroid related, patient states "im no longer diabetic"  . Essential hypertension 08/08/2015  . GERD (gastroesophageal reflux disease)   . History of blood transfusion   . Hyperlipidemia   . Menorrhagia   . Migraine   . Morbid obesity ([place])   . Otitis media 06/28/2015  . Peptic ulcer disease   . Positive ANA (antinuclear antibody) 02/14/2012  . Sarcoid    including hand per rheumatology-Dr. Amil Amen  . Sarcoidosis of lung ([place])   . Shortness of breath    on exertion  . Varicose veins with pain      Social History   Tobacco Use  . Smoking status: Former Smoker    Packs/day: 1.00    Years: 20.00    Pack years: 20.00    Types: Cigarettes    Quit date: 10/25/2012    Years since quitting: 6.7  . Smokeless tobacco: Never Used  . Tobacco comment: QUIT 04/2010 AND STARTED BACK 2014 X 3 MONTHS. less than 1 ppd.  started at age 58.    Substance Use Topics  . Alcohol use: No  . Drug use: No        Observations/Objective: Physical Exam  VITALS: Patient denies fever. GENERAL: Alert, in no acute distress. HEENT: Atraumatic. NECK: Normal movements of the  head and neck. CARDIOPULMONARY: No increased WOB. Speaking in clear sentences. I:E ratio WNL.  MS: Moves all visible extremities without noticeable abnormality. PSYCH: Pleasant and cooperative, well-groomed. Speech normal rate and rhythm. Affect is appropriate. Insight and judgement are appropriate. Attention is focused, linear, and appropriate.  NEURO: CN grossly intact. Oriented as arrived to appointment on time with no prompting. Moves both UE equally.     Assessment and Plan:    ICD-10-CM   1. Gastroenteritis  K52.9        Follow Up Instructions: Treating with Zofran.  Instructed patient to stay hydrated with clear liquids.   Instructed her to be seen here or at the ED if she is not able to stay hydrated at home;  Or if she develops new symptoms such as abdominal pain or fever or other concerns.  Patient agrees to plan of care.      I discussed the assessment and treatment plan with the patient. The patient was provided an opportunity to ask questions and all were answered. The patient agreed with the plan and demonstrated an understanding of the instructions.   The patient was advised to call back or seek an in-person evaluation if the symptoms worsen or if the condition fails to improve as anticipated.      Nicole Balloon, NP  07/31/2019 12:45 PM         Nicole Balloon, NP 07/31/19 [id number]

## 2019-07-31 NOTE — Discharge Instructions (Addendum)
Take the Zofran as directed.    Keep yourself hydrated with clear liquids such as Sprite or Ginger Ale.  You will need to be seen in person here or in the emergency department if you are unable to stay hydrated at home  or if you develop new symptoms such as fever or abdominal pain or other concerns.

## 2019-08-02 ENCOUNTER — Encounter: Payer: Self-pay | Admitting: Internal Medicine

## 2019-08-02 ENCOUNTER — Ambulatory Visit: Payer: BC Managed Care – PPO | Admitting: Neurology

## 2019-08-02 ENCOUNTER — Other Ambulatory Visit: Payer: Self-pay

## 2019-08-02 ENCOUNTER — Ambulatory Visit (INDEPENDENT_AMBULATORY_CARE_PROVIDER_SITE_OTHER): Payer: BC Managed Care – PPO | Admitting: Internal Medicine

## 2019-08-02 DIAGNOSIS — Z20828 Contact with and (suspected) exposure to other viral communicable diseases: Secondary | ICD-10-CM | POA: Diagnosis not present

## 2019-08-02 DIAGNOSIS — Z20822 Contact with and (suspected) exposure to covid-19: Secondary | ICD-10-CM

## 2019-08-02 MED ORDER — DOXYCYCLINE HYCLATE 100 MG PO TABS: 100.0000 mg | ORAL_TABLET | Freq: Two times a day (BID) | ORAL | 0 refills | Status: DC

## 2019-08-02 NOTE — Assessment & Plan Note (Signed)
Covid-19 testing ordered. Rx doxycycline to cover sinus infection as pcn and z-pack allergy.

## 2019-08-02 NOTE — Progress Notes (Signed)
Virtual Visit via Video Note  I connected with Nicole Melendez on 08/02/19 at 10:00 AM EDT by a video enabled telemedicine application and verified that I am speaking with the correct person using two identifiers.  The patient and the provider were at separate locations throughout the entire encounter.   I discussed the limitations of evaluation and management by telemedicine and the availability of in person appointments. The patient expressed understanding and agreed to proceed. The patient and the provider were the only parties present for the visit unless noted in HPI below.  History of Present Illness: The patient is a 43 y.o. female with visit for several concerns of acute illness. Started with diarrhea 2 days ago and televisit with urgent care due to vomiting. Given zofran. Since the diarrhea is stopped still nauseous and vomiting daily. No blood in stool or vomit. She is not eating at all and drinking only some liquids. Does have some lightheadedness mild with standing. Has held her lasix and telmisartan since illness started. Has concurrent diabetes and sugars are running higher than normal but less than 200. Has had some new sinus congestion and ear pain in the last day or so. Still denies fevers or chills. Does have some "cold symptom feeling" but cannot be more specific. Denies SOB. Overall it is not improving and new symptoms are developing. Has tried zofran which is helping some with the nausea.   Observations/Objective: Appearance: looks ill, breathing appears normal, lying in bed, casual grooming, abdomen does not appear distended, throat with drainage, memory normal, mental status is A and O times 3  Assessment and Plan: See problem oriented charting  Follow Up Instructions: covid-19 testing and doxycycline.   I discussed the assessment and treatment plan with the patient. The patient was provided an opportunity to ask questions and all were answered. The patient agreed with the plan  and demonstrated an understanding of the instructions.   The patient was advised to call back or seek an in-person evaluation if the symptoms worsen or if the condition fails to improve as anticipated.  Hoyt Koch, MD

## 2019-08-03 LAB — NOVEL CORONAVIRUS, NAA: SARS-CoV-2, NAA: NOT DETECTED

## 2019-08-06 ENCOUNTER — Telehealth: Payer: Self-pay | Admitting: Emergency Medicine

## 2019-08-06 ENCOUNTER — Encounter: Payer: Self-pay | Admitting: Internal Medicine

## 2019-08-06 NOTE — Telephone Encounter (Signed)
Form completed and placed in MDs box for signature.

## 2019-08-06 NOTE — Telephone Encounter (Signed)
Yes, she had to stay out until covid-19 testing returned. From 07/31/19 is fine.

## 2019-08-06 NOTE — Telephone Encounter (Signed)
Received FMLA for OV on 08/02/2019. Pt has been out of work since 07/31/19. Is this something you are willing to sign off on?

## 2019-08-07 NOTE — Telephone Encounter (Signed)
Spoke with pt, faxed FMLA paperwork in.. Pt has scheduled appt with Dr Jenny Reichmann on 10/14. Pt states she is still having symptoms and has not returned to work.

## 2019-08-08 ENCOUNTER — Ambulatory Visit (INDEPENDENT_AMBULATORY_CARE_PROVIDER_SITE_OTHER): Payer: BC Managed Care – PPO | Admitting: Internal Medicine

## 2019-08-08 DIAGNOSIS — J069 Acute upper respiratory infection, unspecified: Secondary | ICD-10-CM

## 2019-08-08 DIAGNOSIS — B370 Candidal stomatitis: Secondary | ICD-10-CM | POA: Diagnosis not present

## 2019-08-08 DIAGNOSIS — R062 Wheezing: Secondary | ICD-10-CM | POA: Diagnosis not present

## 2019-08-08 MED ORDER — GUAIFENESIN ER 600 MG PO TB12: 1200.0000 mg | ORAL_TABLET | Freq: Two times a day (BID) | ORAL | 1 refills | Status: DC | PRN

## 2019-08-08 MED ORDER — FLUCONAZOLE 100 MG PO TABS: 100.0000 mg | ORAL_TABLET | Freq: Every day | ORAL | 0 refills | Status: AC

## 2019-08-08 MED ORDER — ONETOUCH VERIO VI STRP: ORAL_STRIP | 12 refills | Status: DC

## 2019-08-08 MED ORDER — ONETOUCH VERIO W/DEVICE KIT: PACK | 0 refills | Status: DC

## 2019-08-08 MED ORDER — PREDNISONE 10 MG PO TABS: ORAL_TABLET | ORAL | 0 refills | Status: DC

## 2019-08-08 MED ORDER — BD PEN NEEDLE NANO U/F 32G X 4 MM MISC: 3 refills | Status: DC

## 2019-08-08 NOTE — Progress Notes (Signed)
Patient ID: Nicole Melendez, female   DOB: 03-29-1976, 43 y.o.   MRN: [id number]  Virtual Visit via Video Note  I connected with Nicole Melendez on 08/08/19 at 11:20 AM EDT by a video enabled telemedicine application and verified that I am speaking with the correct person using two identifiers.  Location: Patient: at home Provider: at office   I discussed the limitations of evaluation and management by telemedicine and the availability of in person appointments. The patient expressed understanding and agreed to proceed.  History of Present Illness: Here with 3 days constant moderate ST, all over body aches, and chillls, fever, and bilat ear pressure and drainage; last antibiotic was today, did not seem to help, but now wheezing started up last night with mild sob, non prod cough.  Pt denies chest pain, orthopnea, PND, increased LE swelling, palpitations, dizziness or syncope. Hurts to swallow, believes she has thrush Also co N/v/d since last wk, only vomitied once today as she takes the nausea med.  Pt denies new neurological symptoms such as new headache, or facial or extremity weakness or numbness   Pt denies polydipsia, polyuria Past Medical History:  Diagnosis Date  . Allergic rhinitis, cause unspecified   . Anemia   . Anxiety   . Depression   . Diabetes mellitus type II    steroid related, patient states "im no longer diabetic"  . Essential hypertension 08/08/2015  . GERD (gastroesophageal reflux disease)   . History of blood transfusion   . Hyperlipidemia   . Menorrhagia   . Migraine   . Morbid obesity ([place])   . Otitis media 06/28/2015  . Peptic ulcer disease   . Positive ANA (antinuclear antibody) 02/14/2012  . Sarcoid    including hand per rheumatology-Dr. Amil Amen  . Sarcoidosis of lung ([place])   . Shortness of breath    on exertion  . Varicose veins with pain    Past Surgical History:  Procedure Laterality Date  . ABDOMINAL HYSTERECTOMY    . COLONOSCOPY WITH PROPOFOL N/A  10/19/2016   Procedure: COLONOSCOPY WITH PROPOFOL;  Surgeon: Mauri Pole, MD;  Location: [facility];  Service: Endoscopy;  Laterality: N/A;  . ESOPHAGOGASTRODUODENOSCOPY (EGD) WITH PROPOFOL N/A 10/19/2016   Procedure: ESOPHAGOGASTRODUODENOSCOPY (EGD) WITH PROPOFOL;  Surgeon: Mauri Pole, MD;  Location: [facility];  Service: Endoscopy;  Laterality: N/A;  . HERNIA MESH REMOVAL  02/2013  . uterine ablation  03/2010  . WISDOM TOOTH EXTRACTION      reports that she quit smoking about 6 years ago. Her smoking use included cigarettes. She has a 20.00 pack-year smoking history. She has never used smokeless tobacco. She reports that she does not drink alcohol or use drugs. family history includes Allergies in her mother; Bone cancer in her maternal aunt; Breast cancer in her maternal aunt; Diabetes in her father and mother; Heart attack in her mother; Heart disease in her father; Hypertension in an other family member; Lung cancer in her maternal aunt; Ovarian cancer in her maternal aunt; Rheum arthritis in her father; Stroke in her father and maternal uncle. Allergies  Allergen Reactions  . Azithromycin Hives    (z pak) hives  . Bee Venom Anaphylaxis  . Flagyl [Metronidazole] Hives and Shortness Of Breath  . Penicillins Shortness Of Breath and Swelling    Has patient had a PCN reaction causing immediate rash, facial/tongue/throat swelling, SOB or lightheadedness with hypotension: Yes Has patient had a PCN reaction causing severe rash involving mucus membranes or skin  necrosis: No Has patient had a PCN reaction that required hospitalization No Has patient had a PCN reaction occurring within the last 10 years: No If all of the above answers are "NO", then may proceed with Cephalosporin use.  REACTION: swelling and difficulty breathing  . Shellfish Allergy Anaphylaxis  . Klonopin [Clonazepam] Other (See Comments)    Memory difficulty   Current Outpatient Medications on File Prior to  Visit  Medication Sig Dispense Refill  . acetaminophen (TYLENOL) 500 MG tablet Take 1 tablet (500 mg total) by mouth every 6 (six) hours as needed. 30 tablet 0  . albuterol (PROVENTIL HFA;VENTOLIN HFA) 108 (90 Base) MCG/ACT inhaler Inhale 2 puffs into the lungs every 4 (four) hours as needed for wheezing or shortness of breath. 1 Inhaler 2  . ALPRAZolam (XANAX) 0.5 MG tablet TAKE 1 TABLET(0.5 MG) BY MOUTH THREE TIMES DAILY AS NEEDED 90 tablet 2  . azelastine (ASTELIN) 0.1 % nasal spray Place 2 sprays into both nostrils 2 (two) times daily. Use in each nostril as directed 30 mL 12  . cetirizine (ZYRTEC) 10 MG tablet TAKE 1 TABLET(10 MG) BY MOUTH DAILY 90 tablet 1  . Diclofenac Sodium (PENNSAID) 2 % SOLN Apply 1 pump twice daily as needed. 112 g 3  . doxycycline (VIBRA-TABS) 100 MG tablet Take 1 tablet (100 mg total) by mouth 2 (two) times daily. 14 tablet 0  . fluticasone (FLONASE) 50 MCG/ACT nasal spray Place 2 sprays into both nostrils daily. 16 g 0  . furosemide (LASIX) 40 MG tablet 1 tab by mouth every day in the AM and then 1 as needed only for persistent swelling in the PM 60 tablet 11  . gabapentin (NEURONTIN) 300 MG capsule TAKE 2 CAPSULES(600 MG) BY MOUTH THREE TIMES DAILY 180 capsule 5  . hydrOXYzine (ATARAX/VISTARIL) 10 MG tablet TAKE 1 TABLET(10 MG) BY MOUTH THREE TIMES DAILY AS NEEDED (Patient taking differently: Take 10 mg by mouth 3 (three) times daily as needed for anxiety. ) 60 tablet 0  . Insulin Lispro (HUMALOG KWIKPEN) 200 UNIT/ML SOPN Inject 12 Units into the skin 4 (four) times daily. WITH MEALS W/ SLIDING SCALE ASD E11.9 5 pen 11  . levalbuterol (XOPENEX HFA) 45 MCG/ACT inhaler Inhale 1-2 puffs into the lungs every 8 (eight) hours as needed for wheezing or shortness of breath. USE 2 PUFFS 3 TIMES DAILY X 5 DAYS THEN BACK TO HOME REGIMEN AS NEEDED. 1 Inhaler 0  . lidocaine (LIDODERM) 5 % Place 1 patch onto the skin daily. Remove & Discard patch within 12 hours or as directed by MD  60 patch 2  . mometasone-formoterol (DULERA) 200-5 MCG/ACT AERO Inhale 2 puffs into the lungs 2 (two) times a day. 8.8 g 5  . omeprazole (PRILOSEC) 40 MG capsule TAKE 1 CAPSULE(40 MG) BY MOUTH DAILY 90 capsule 1  . ondansetron (ZOFRAN) 4 MG tablet Take 1 tablet (4 mg total) by mouth every 6 (six) hours. 30 tablet 0  . oxyCODONE-acetaminophen (PERCOCET) 10-325 MG tablet Take 1 tablet by mouth every 8 (eight) hours as needed for pain.   0  . potassium chloride (K-DUR) 10 MEQ tablet 3 tab by mouth per day only when taking the lasix 90 tablet 11  . rizatriptan (MAXALT-MLT) 10 MG disintegrating tablet Take 1 tablet earliest onset of migraine.  May repeat in 2 hours if needed.  Maximum 2 tablets in 24 hours 9 tablet 3  . rosuvastatin (CRESTOR) 40 MG tablet TAKE 1 TABLET(40 MG) BY MOUTH DAILY  90 tablet 1  . sertraline (ZOLOFT) 100 MG tablet TAKE 2 TABLETS(200 MG) BY MOUTH DAILY 180 tablet 1  . telmisartan (MICARDIS) 40 MG tablet TAKE 1 TABLET(40 MG) BY MOUTH DAILY 90 tablet 0  . TURMERIC PO Take 1 tablet by mouth daily.    . vitamin B-12 (CYANOCOBALAMIN) 1000 MCG tablet Take 1,000 mcg by mouth daily.    . Vitamin D, Ergocalciferol, (DRISDOL) 50000 units CAPS capsule TAKE 1 CAPSULE BY MOUTH EVERY 7 DAYS (Patient taking differently: Take 50,000 Units by mouth every 7 (seven) days. ) 12 capsule 0  . zolpidem (AMBIEN) 10 MG tablet Take 1 tablet (10 mg total) by mouth at bedtime as needed for sleep. 90 tablet 1  . zonisamide (ZONEGRAN) 100 MG capsule Take 2 capsules (200 mg total) by mouth daily. 60 capsule 3   No current facility-administered medications on file prior to visit.    Observations/Objective: Alert, NAD, appropriate mood and affect, resps normal, cn 2-12 intact, moves all 4s, no visible rash or swelling Lab Results  Component Value Date   WBC 6.6 07/27/2019   HGB 13.1 07/27/2019   HCT 39.4 07/27/2019   PLT 276.0 07/27/2019   GLUCOSE 123 (H) 07/27/2019   CHOL 142 07/27/2019   TRIG 98.0  07/27/2019   HDL 47.80 07/27/2019   LDLDIRECT 135.0 07/05/2011   LDLCALC 75 07/27/2019   ALT 12 07/27/2019   AST 13 07/27/2019   NA 139 07/27/2019   K 4.0 07/27/2019   CL 107 07/27/2019   CREATININE 0.74 07/27/2019   BUN 9 07/27/2019   CO2 25 07/27/2019   TSH 1.60 07/27/2019   INR 1.3 11/17/2012   HGBA1C 6.7 (H) 07/27/2019   MICROALBUR <0.7 07/27/2019   Assessment and Plan: See notes  Follow Up Instructions: See notes   I discussed the assessment and treatment plan with the patient. The patient was provided an opportunity to ask questions and all were answered. The patient agreed with the plan and demonstrated an understanding of the instructions.   The patient was advised to call back or seek an in-person evaluation if the symptoms worsen or if the condition fails to improve as anticipated.   Cathlean Cower, MD

## 2019-08-08 NOTE — Patient Instructions (Signed)
Please take all new medication as prescribed - the prednisone, and diflucan, and mucinex as needed  Please continue all other medications as before, and refills have been done if requested.  Please have the pharmacy call with any other refills you may need.  Please continue your efforts at being more active, low cholesterol diet, and weight control.  Please keep your appointments with your specialists as you may have planned  You are given the work note today  We sent the prescriptions for the [employer] and supplies, and the pen needles

## 2019-08-12 ENCOUNTER — Encounter: Payer: Self-pay | Admitting: Internal Medicine

## 2019-08-12 DIAGNOSIS — J069 Acute upper respiratory infection, unspecified: Secondary | ICD-10-CM | POA: Insufficient documentation

## 2019-08-12 NOTE — Assessment & Plan Note (Addendum)
Mild to mod, ? Viral, for mucinex, to UC if worse (declines now); work note given

## 2019-08-12 NOTE — Assessment & Plan Note (Signed)
/  Mild to mod, for predpac asd,  to f/u any worsening symptoms or concerns 

## 2019-08-12 NOTE — Assessment & Plan Note (Signed)
S/p recent antibx and steroid tx - for diflucan asd,  to f/u any worsening symptoms or concerns

## 2019-08-14 DIAGNOSIS — D869 Sarcoidosis, unspecified: Secondary | ICD-10-CM | POA: Diagnosis not present

## 2019-08-14 DIAGNOSIS — Z79891 Long term (current) use of opiate analgesic: Secondary | ICD-10-CM | POA: Diagnosis not present

## 2019-08-14 DIAGNOSIS — Z79899 Other long term (current) drug therapy: Secondary | ICD-10-CM | POA: Diagnosis not present

## 2019-08-14 DIAGNOSIS — M47817 Spondylosis without myelopathy or radiculopathy, lumbosacral region: Secondary | ICD-10-CM | POA: Diagnosis not present

## 2019-08-14 DIAGNOSIS — G894 Chronic pain syndrome: Secondary | ICD-10-CM | POA: Diagnosis not present

## 2019-08-14 DIAGNOSIS — M069 Rheumatoid arthritis, unspecified: Secondary | ICD-10-CM | POA: Diagnosis not present

## 2019-08-27 ENCOUNTER — Telehealth: Payer: Self-pay | Admitting: Internal Medicine

## 2019-08-27 ENCOUNTER — Encounter: Payer: Self-pay | Admitting: Pulmonary Disease

## 2019-08-27 ENCOUNTER — Ambulatory Visit: Payer: Self-pay | Admitting: *Deleted

## 2019-08-27 ENCOUNTER — Telehealth (INDEPENDENT_AMBULATORY_CARE_PROVIDER_SITE_OTHER): Payer: BC Managed Care – PPO | Admitting: Pulmonary Disease

## 2019-08-27 DIAGNOSIS — Z20828 Contact with and (suspected) exposure to other viral communicable diseases: Secondary | ICD-10-CM | POA: Diagnosis not present

## 2019-08-27 DIAGNOSIS — J0100 Acute maxillary sinusitis, unspecified: Secondary | ICD-10-CM | POA: Diagnosis not present

## 2019-08-27 DIAGNOSIS — R05 Cough: Secondary | ICD-10-CM

## 2019-08-27 DIAGNOSIS — R059 Cough, unspecified: Secondary | ICD-10-CM

## 2019-08-27 DIAGNOSIS — J301 Allergic rhinitis due to pollen: Secondary | ICD-10-CM

## 2019-08-27 DIAGNOSIS — D860 Sarcoidosis of lung: Secondary | ICD-10-CM

## 2019-08-27 DIAGNOSIS — Z20822 Contact with and (suspected) exposure to covid-19: Secondary | ICD-10-CM

## 2019-08-27 MED ORDER — FLUCONAZOLE 150 MG PO TABS: ORAL_TABLET | ORAL | 1 refills | Status: DC

## 2019-08-27 MED ORDER — LEVOFLOXACIN 500 MG PO TABS: 500.0000 mg | ORAL_TABLET | Freq: Every day | ORAL | 0 refills | Status: DC

## 2019-08-27 NOTE — Telephone Encounter (Signed)
Patient states she was seen by pulmonary virtually today.  Would like to know if Dr. Jenny Reichmann would be able to send a script for a yeast infection to  [facility] on [address]?

## 2019-08-27 NOTE — Assessment & Plan Note (Signed)
Video visit so unable to fully complete physical exam Patient endorsing right maxillary sinus tenderness and pressure Patient endorsing purulent green mucus Fever today Negative Covid test on 08/02/2019 Allergies to azithromycin and penicillins Recent antibiotic use over the last 3 months  Plan: Levaquin today Patient to follow-up with primary care if yeast symptoms develop If symptoms or not improving despite Levaquin likely need to retest patient for Covid Continue Flonase Continue nasal saline rinses 1-2 times daily Continue Astelin

## 2019-08-27 NOTE — Assessment & Plan Note (Signed)
Recent negative Covid test on 08/02/2019 Worsen sinus symptoms  Plan: Will treat patient for acute sinusitis today Need to consider repeat Covid testing if symptoms or not improving despite antibiotic coverage

## 2019-08-27 NOTE — Telephone Encounter (Signed)
Done to [facility] 

## 2019-08-27 NOTE — Telephone Encounter (Signed)
Patient has sarcoidosis and it is causing her to have SOB. Patient is having SOB- she is using her inhaler. She reports congestion- greenish in color. Patient was seen and treated for URI 10/4. Patient states she finished her medication- but her symptoms have returned and she has developed a cough.  Call to office- recording on the line- not open. Message sent high priority for scheduling.  Reason for Disposition . [1] Longstanding difficulty breathing (e.g., CHF, COPD, emphysema) AND [2] WORSE than normal  Answer Assessment - Initial Assessment Questions 1. RESPIRATORY STATUS: "Describe your breathing?" (e.g., wheezing, shortness of breath, unable to speak, severe coughing)      Breathing is fine- wheezing and coughing at night 2. ONSET: "When did this breathing problem begin?"      Restarted after prednisone 2 weeks ago- cough started Saturday night 3. PATTERN "Does the difficult breathing come and go, or has it been constant since it started?"      Mostly at night- has had to use rescue inhaler 4. SEVERITY: "How bad is your breathing?" (e.g., mild, moderate, severe)    - MILD: No SOB at rest, mild SOB with walking, speaks normally in sentences, can lay down, no retractions, pulse < 100.    - MODERATE: SOB at rest, SOB with minimal exertion and prefers to sit, cannot lie down flat, speaks in phrases, mild retractions, audible wheezing, pulse 100-120.    - SEVERE: Very SOB at rest, speaks in single words, struggling to breathe, sitting hunched forward, retractions, pulse > 120      mild 5. RECURRENT SYMPTOM: "Have you had difficulty breathing before?" If so, ask: "When was the last time?" and "What happened that time?"      Yes- lung condition, hx bronchitis  6. CARDIAC HISTORY: "Do you have any history of heart disease?" (e.g., heart attack, angina, bypass surgery, angioplasty)      no 7. LUNG HISTORY: "Do you have any history of lung disease?"  (e.g., pulmonary embolus, asthma, emphysema)     bronchitis 8. CAUSE: "What do you think is causing the breathing problem?"      Chest congestion 9. OTHER SYMPTOMS: "Do you have any other symptoms? (e.g., dizziness, runny nose, cough, chest pain, fever)     Cough, cold symptoms, low grade fever 10. PREGNANCY: "Is there any chance you are pregnant?" "When was your last menstrual period?"       n/a 11. TRAVEL: "Have you traveled out of the country in the last month?" (e.g., travel history, exposures)       no  Protocols used: BREATHING DIFFICULTY-A-AH

## 2019-08-27 NOTE — Assessment & Plan Note (Signed)
Purulent green drainage Last sputum culture was in 2018  Discussion: Unfortunately patient is unable to coordinate transportation to our office to pick up sputum cups, patient was febrile today, patient is unable to coordinate transportation to come back and bring sputum sample back in the office even if she was able to produce.  Discussed with patient today that likely we will need to consider reculturing her sputum sometime over the next 6 months as last sputum sampling was in 2018 and patient has history of high amount of antibiotic use as well as recurrent upper respiratory/sinus infections.  Plan: Patient to contact our office if she is able to coordinate picking up sputum culture samples so that way we could coordinate testing

## 2019-08-27 NOTE — Patient Instructions (Signed)
You were seen today by Lauraine Rinne, NP  for:   1. Acute maxillary sinusitis, recurrence not specified  - levofloxacin (LEVAQUIN) 500 MG tablet; Take 1 tablet (500 mg total) by mouth daily.  Dispense: 7 tablet; Refill: 0  Continue Astelin nasal spray Continue Flonase nasal spray Continue nasal saline rinses twice daily Continue Zyrtec  If you start developing symptoms of yeast infection please contact primary care  2. Sarcoidosis of lung ([place])  Dulera 200 >>> 2 puffs in the morning right when you wake up, rinse out your mouth after use, 12 hours later 2 puffs, rinse after use >>> Take this daily, no matter what >>> This is not a rescue inhaler   Only use your albuterol as a rescue medication to be used if you can't catch your breath by resting or doing a relaxed purse lip breathing pattern.  - The less you use it, the better it will work when you need it. - Ok to use up to 2 puffs  every 4 hours if you must but call for immediate appointment if use goes up over your usual need - Don't leave home without it !!  (think of it like the spare tire for your car)    3. Allergic rhinitis due to pollen, unspecified seasonality  Continue Astelin nasal spray Continue Flonase nasal spray Continue nasal saline rinses twice daily Continue Zyrtec  4. Suspected COVID-19 virus infection  Recent negative Covid test on 08/02/2019.  If symptoms or not improving under these recommendations and with antibiotic treatment please contact our office so that we we can coordinate outpatient Covid testing.  Or you can present to a Covid testing site on your own accord those results we routed to primary care  COVID Testing Site Locations (For sick patients only, pre-procedure is done differently)  . [facility] [address] [place], [place] [address] . [place]  (on [employer] [address])  o Nature conservation officer  . [facility]     5. Cough   Continue recommendations as above In the future I would like to be able to culture your sputum   We recommend today:  No orders of the defined types were placed in this encounter.  No orders of the defined types were placed in this encounter.  Meds ordered this encounter  Medications  . levofloxacin (LEVAQUIN) 500 MG tablet    Sig: Take 1 tablet (500 mg total) by mouth daily.    Dispense:  7 tablet    Refill:  0    Follow Up:    Return in about 2 months (around 10/27/2019), or if symptoms worsen or fail to improve, for Follow up with Dr. Lamonte Sakai.   Please do your part to reduce the spread of COVID-19:      Reduce your risk of any infection  and COVID19 by using the similar precautions used for avoiding the common cold or flu:  Marland Kitchen Wash your hands often with soap and warm water for at least 20 seconds.  If soap and water are not readily available, use an alcohol-based hand sanitizer with at least 60% alcohol.  . If coughing or sneezing, cover your mouth and nose by coughing or sneezing into the elbow areas of your shirt or coat, into a tissue or into your sleeve (not your hands). Langley Gauss A MASK when in public  . Avoid shaking hands with others and  consider head nods or verbal greetings only. . Avoid touching your eyes, nose, or mouth with unwashed hands.  . Avoid close contact with people who are sick. . Avoid places or events with large numbers of people in one location, like concerts or sporting events. . If you have some symptoms but not all symptoms, continue to monitor at home and seek medical attention if your symptoms worsen. . If you are having a medical emergency, call 911.   [facility] / e-Visit: [email or web address]         [facility]: [facility]: [id number]                   [facility]: [id number]     It  is flu season:   >>> Best ways to protect herself from the flu: Receive the yearly flu vaccine, practice good hand hygiene washing with soap and also using hand sanitizer when available, eat a nutritious meals, get adequate rest, hydrate appropriately   Please contact the office if your symptoms worsen or you have concerns that you are not improving.   Thank you for choosing [facility] for your healthcare, and for allowing [place] to partner with you on your healthcare journey. I am thankful to be able to provide care to you today.   Wyn Quaker FNP-C    Sinusitis, Adult Sinusitis is soreness and swelling (inflammation) of your sinuses. Sinuses are hollow spaces in the bones around your face. They are located:  Around your eyes.  In the middle of your forehead.  Behind your nose.  In your cheekbones. Your sinuses and nasal passages are lined with a fluid called mucus. Mucus drains out of your sinuses. Swelling can trap mucus in your sinuses. This lets germs (bacteria, virus, or fungus) grow, which leads to infection. Most of the time, this condition is caused by a virus. What are the causes? This condition is caused by:  Allergies.  Asthma.  Germs.  Things that block your nose or sinuses.  Growths in the nose (nasal polyps).  Chemicals or irritants in the air.  Fungus (rare). What increases the risk? You are more likely to develop this condition if:  You have a weak body defense system (immune system).  You do a lot of swimming or diving.  You use nasal sprays too much.  You smoke. What are the signs or symptoms? The main symptoms of this condition are pain and a feeling of pressure around the sinuses. Other symptoms include:  Stuffy nose (congestion).  Runny nose (drainage).  Swelling and warmth in the sinuses.  Headache.  Toothache.  A cough that may get worse at night.  Mucus that collects in the throat or the back of the nose (postnasal drip).   Being unable to smell and taste.  Being very tired (fatigue).  A fever.  Sore throat.  Bad breath. How is this diagnosed? This condition is diagnosed based on:  Your symptoms.  Your medical history.  A physical exam.  Tests to find out if your condition is short-term (acute) or long-term (chronic). Your doctor may: ? Check your nose for growths (polyps). ? Check your sinuses using a tool that has a light (endoscope). ? Check for allergies or germs. ? Do imaging tests, such as an MRI or CT scan. How is this treated? Treatment for this condition depends on the cause and whether it is short-term  or long-term.  If caused by a virus, your symptoms should go away on their own within 10 days. You may be given medicines to relieve symptoms. They include: ? Medicines that shrink swollen tissue in the nose. ? Medicines that treat allergies (antihistamines). ? A spray that treats swelling of the nostrils. ? Rinses that help get rid of thick mucus in your nose (nasal saline washes).  If caused by bacteria, your doctor may wait to see if you will get better without treatment. You may be given antibiotic medicine if you have: ? A very bad infection. ? A weak body defense system.  If caused by growths in the nose, you may need to have surgery. Follow these instructions at home: Medicines  Take, use, or apply over-the-counter and prescription medicines only as told by your doctor. These may include nasal sprays.  If you were prescribed an antibiotic medicine, take it as told by your doctor. Do not stop taking the antibiotic even if you start to feel better. Hydrate and humidify   Drink enough water to keep your pee (urine) pale yellow.  Use a cool mist humidifier to keep the humidity level in your home above 50%.  Breathe in steam for 10-15 minutes, 3-4 times a day, or as told by your doctor. You can do this in the bathroom while a hot shower is running.  Try not to spend time  in cool or dry air. Rest  Rest as much as you can.  Sleep with your head raised (elevated).  Make sure you get enough sleep each night. General instructions   Put a warm, moist washcloth on your face 3-4 times a day, or as often as told by your doctor. This will help with discomfort.  Wash your hands often with soap and water. If there is no soap and water, use hand sanitizer.  Do not smoke. Avoid being around people who are smoking (secondhand smoke).  Keep all follow-up visits as told by your doctor. This is important. Contact a doctor if:  You have a fever.  Your symptoms get worse.  Your symptoms do not get better within 10 days. Get help right away if:  You have a very bad headache.  You cannot stop throwing up (vomiting).  You have very bad pain or swelling around your face or eyes.  You have trouble seeing.  You feel confused.  Your neck is stiff.  You have trouble breathing. Summary  Sinusitis is swelling of your sinuses. Sinuses are hollow spaces in the bones around your face.  This condition is caused by tissues in your nose that become inflamed or swollen. This traps germs. These can lead to infection.  If you were prescribed an antibiotic medicine, take it as told by your doctor. Do not stop taking it even if you start to feel better.  Keep all follow-up visits as told by your doctor. This is important. This information is not intended to replace advice given to you by your health care provider. Make sure you discuss any questions you have with your health care provider. Document Released: 03/29/2008 Document Revised: 03/13/2018 Document Reviewed: 03/13/2018 Elsevier Patient Education  2020 [employer].

## 2019-08-27 NOTE — Assessment & Plan Note (Signed)
Plan: Continue Zyrtec Continue Flonase Continue Astelin Nasal saline rinses 1-2 times daily

## 2019-08-27 NOTE — Progress Notes (Signed)
Virtual Visit via Video Note  I connected with Nicole Melendez on 08/27/19 at  1:30 PM EST by a video enabled telemedicine application and verified that I am speaking with the correct person using two identifiers.  Location: Patient: Home Provider: Office - [facility] - [id number], [address], [place], [place] [address]  I discussed the limitations of evaluation and management by telemedicine and the availability of in person appointments. The patient expressed understanding and agreed to proceed. I also discussed with the patient that there may be a patient responsible charge related to this service. The patient expressed understanding and agreed to proceed.  Patient consented to consult via telephone: Yes People present and their role in pt care: Pt   History of Present Illness:  43 year old female number smoker followed in our office for sarcoidosis, recurrent bronchitis  PMH: Morbid obesity, GERD, Smoker/ Smoking History: Former Smoker 2014. Roommate Smokes Maintenance:  Symbicort 160 Pt of: Dr. Lamonte Sakai   Chief complaint: Short of breath, cough   43 year old female former smoker completing a video visit with our office today for increased shortness of breath as well as worsening cough.  Patient also reports that her cough has now become productive with discolored green mucus for the last 3 days.  She did have a elevated temperature today of 100 F.  She is a recent negative Covid test from 08/02/2019 that showed negative results coordinated from primary care.  She has increased her rescue inhaler use to 2 times daily.  She remains adherent to her [facility].  She does have fatigue and cold chills.  She is endorsing right sided maxillary sinus pressure and tenderness.  Patient has allergies to penicillins as well as azithromycin.  She reports that she has had antibiotics over the last 3 months.  Observations/Objective:  08/27/2019 - temperature -  100  04/30/2019-SARS-CoV-2-negative  01/04/2019-CT chest high-res- imaging stigmata of sarcoidosis very similar to prior study in 2016, no acute findings  09/26/2017-echocardiogram-LV ejection fraction 60 to 65%, PA pressure 27  09/23/2015-pulmonary function test- FVC 2.71 (82% predicted), postbronchodilator ratio 84, postbronchodilator FEV1 2.31 (86% predicted), no bronchodilator response, DLCO 64  05/04/2019-pulmonary function test-FVC 2.62 (81% predicted close parentheses, postbronchodilator ratio 83, postbronchodilator FEV1 2.23 (85% predicted), no bronchodilator response, DLCO 77  Assessment and Plan:  Acute maxillary sinusitis Video visit so unable to fully complete physical exam Patient endorsing right maxillary sinus tenderness and pressure Patient endorsing purulent green mucus Fever today Negative Covid test on 08/02/2019 Allergies to azithromycin and penicillins Recent antibiotic use over the last 3 months  Plan: Levaquin today Patient to follow-up with primary care if yeast symptoms develop If symptoms or not improving despite Levaquin likely need to retest patient for Covid Continue Flonase Continue nasal saline rinses 1-2 times daily Continue Astelin  Sarcoidosis of lung ([place]) Plan: Continue Dulera 200 Continue rescue inhaler as needed Continue to work diligently on reducing BMI and weight  Allergic rhinitis Plan: Continue Zyrtec Continue Flonase Continue Astelin Nasal saline rinses 1-2 times daily  Suspected COVID-19 virus infection Recent negative Covid test on 08/02/2019 Worsen sinus symptoms  Plan: Will treat patient for acute sinusitis today Need to consider repeat Covid testing if symptoms or not improving despite antibiotic coverage  Cough Purulent green drainage Last sputum culture was in 2018  Discussion: Unfortunately patient is unable to coordinate transportation to our office to pick up sputum cups, patient was febrile today, patient is  unable to coordinate transportation to come back and bring sputum  sample back in the office even if she was able to produce.  Discussed with patient today that likely we will need to consider reculturing her sputum sometime over the next 6 months as last sputum sampling was in 2018 and patient has history of high amount of antibiotic use as well as recurrent upper respiratory/sinus infections.  Plan: Patient to contact our office if she is able to coordinate picking up sputum culture samples so that way we could coordinate testing   Follow Up Instructions:  Return in about 2 months (around 10/27/2019), or if symptoms worsen or fail to improve, for Follow up with Dr. Lamonte Sakai.    I discussed the assessment and treatment plan with the patient. The patient was provided an opportunity to ask questions and all were answered. The patient agreed with the plan and demonstrated an understanding of the instructions.   The patient was advised to call back or seek an in-person evaluation if the symptoms worsen or if the condition fails to improve as anticipated.  I provided 26 minutes of non-face-to-face time during this encounter.   Lauraine Rinne, NP

## 2019-08-27 NOTE — Assessment & Plan Note (Signed)
Plan: Continue Dulera 200 Continue rescue inhaler as needed Continue to work diligently on reducing BMI and weight

## 2019-08-28 NOTE — Telephone Encounter (Signed)
Patient is requesting a work note for 11/2 and 11/3. Aaron Edelman are you ok with writing this work note. Please advise.

## 2019-08-28 NOTE — Telephone Encounter (Signed)
Called pt no answer LMOM MD sent rx to [facility]../;lmb 

## 2019-08-28 NOTE — Telephone Encounter (Signed)
Yes okay to generate work note stating that patient completed a virtual visit on 08/27/2019 and was treated for acute sinusitis.  There is no indication or request by me for the patient did not present to work today 08/28/2019.  Wyn Quaker, FNP

## 2019-08-29 ENCOUNTER — Telehealth: Payer: Self-pay

## 2019-08-29 NOTE — Telephone Encounter (Signed)
Work note for 08/27/19 created and sent per Aaron Edelman.

## 2019-08-31 ENCOUNTER — Telehealth: Payer: Self-pay | Admitting: Pulmonary Disease

## 2019-08-31 ENCOUNTER — Other Ambulatory Visit: Payer: BC Managed Care – PPO

## 2019-08-31 DIAGNOSIS — D869 Sarcoidosis, unspecified: Secondary | ICD-10-CM | POA: Diagnosis not present

## 2019-08-31 DIAGNOSIS — D860 Sarcoidosis of lung: Secondary | ICD-10-CM

## 2019-08-31 DIAGNOSIS — J069 Acute upper respiratory infection, unspecified: Secondary | ICD-10-CM | POA: Diagnosis not present

## 2019-08-31 DIAGNOSIS — R05 Cough: Secondary | ICD-10-CM | POA: Diagnosis not present

## 2019-08-31 NOTE — Telephone Encounter (Signed)
Order has been placed for the sputum culture. Nothing further needed.

## 2019-08-31 NOTE — Telephone Encounter (Signed)
Please place order for sputum culture.  Wyn Quaker FNP

## 2019-08-31 NOTE — Telephone Encounter (Signed)
Took a call from [place] with [facility]. Per Solmon Ice, pt came by and dropped off a sputum sample but no orders had been placed.   Aaron Edelman, please advise orders that you want to be placed for the sputum culture. Thanks!

## 2019-09-03 ENCOUNTER — Telehealth: Payer: Self-pay | Admitting: Pulmonary Disease

## 2019-09-03 ENCOUNTER — Telehealth: Payer: Self-pay | Admitting: Neurology

## 2019-09-03 LAB — RESPIRATORY CULTURE OR RESPIRATORY AND SPUTUM CULTURE
MICRO NUMBER:: 1073399
RESULT:: NORMAL
SPECIMEN QUALITY:: ADEQUATE

## 2019-09-03 NOTE — Telephone Encounter (Signed)
I contacted patient and she states she has lost taste and smell, with cough, I advised to reschedule she is awaiting her lung dr.

## 2019-09-03 NOTE — Telephone Encounter (Signed)
This is not my patient.  Forwarding to Dr. Tomi Likens

## 2019-09-03 NOTE — Telephone Encounter (Signed)
Please advise 

## 2019-09-03 NOTE — Telephone Encounter (Signed)
Routing to Brian as an FYI. 

## 2019-09-03 NOTE — Telephone Encounter (Signed)
See telephone note from earlier today.

## 2019-09-03 NOTE — Telephone Encounter (Signed)
I suspect it is okay.  There is a protocol to screen patients for possible COVID.  As long as she is screened without red flags, then it should be okay.

## 2019-09-03 NOTE — Telephone Encounter (Signed)
Left message for patient to call back  

## 2019-09-03 NOTE — Telephone Encounter (Signed)
Pt called back stating that she is going to go get a COVID test - just wanted Wyn Quaker to know. [phone].

## 2019-09-03 NOTE — Telephone Encounter (Signed)
Patient called in that she has a cough- no flu like symptoms just a cough from allergies and she wanted to know if she could still come in for EMG appt tomorrow. Wanted to double check with yall before I told yes or no. Please let us know or call her back. She said anyone can leave her a VM to let her know. Thanks!

## 2019-09-03 NOTE — Telephone Encounter (Signed)
Noted.   Please have records faxed to our office if she is not completing within the [facility] system.  Wyn Quaker FNP

## 2019-09-04 ENCOUNTER — Telehealth: Payer: Self-pay | Admitting: Pulmonary Disease

## 2019-09-04 ENCOUNTER — Encounter: Payer: BC Managed Care – PPO | Admitting: Neurology

## 2019-09-04 ENCOUNTER — Encounter: Payer: Self-pay | Admitting: Pulmonary Disease

## 2019-09-04 ENCOUNTER — Other Ambulatory Visit: Payer: Self-pay

## 2019-09-04 ENCOUNTER — Telehealth (INDEPENDENT_AMBULATORY_CARE_PROVIDER_SITE_OTHER): Payer: BC Managed Care – PPO | Admitting: Pulmonary Disease

## 2019-09-04 DIAGNOSIS — J301 Allergic rhinitis due to pollen: Secondary | ICD-10-CM | POA: Diagnosis not present

## 2019-09-04 DIAGNOSIS — D860 Sarcoidosis of lung: Secondary | ICD-10-CM

## 2019-09-04 DIAGNOSIS — Z20822 Contact with and (suspected) exposure to covid-19: Secondary | ICD-10-CM

## 2019-09-04 DIAGNOSIS — Z20828 Contact with and (suspected) exposure to other viral communicable diseases: Secondary | ICD-10-CM | POA: Diagnosis not present

## 2019-09-04 MED ORDER — BENZONATATE 200 MG PO CAPS: 200.0000 mg | ORAL_CAPSULE | Freq: Three times a day (TID) | ORAL | 1 refills | Status: DC | PRN

## 2019-09-04 NOTE — Assessment & Plan Note (Addendum)
Recent covid test today  Persistent cough  Increased fatigue  Lost taste and smell   Plan: Can use Tylenol 500 mg or ibuprofen 400 mg every 6 hours for management of fever  Can rotate Tylenol and ibuprofen as needed If laying down try to lay on belly If shortness of breath or wheezing worsens or fevers persist and are unable to be controlled please present to the emergency room We will await Covid testing Continue to hydrate well and eat appropriately

## 2019-09-04 NOTE — Progress Notes (Signed)
Virtual Visit via Video Note  I connected with Nicole Melendez on 09/04/19 at  1:30 PM EST by a video enabled telemedicine application and verified that I am speaking with the correct person using two identifiers.  Location: Patient: Home Provider: Office - [facility] - [id number], [address], [place], [place] [address]  I discussed the limitations of evaluation and management by telemedicine and the availability of in person appointments. The patient expressed understanding and agreed to proceed. I also discussed with the patient that there may be a patient responsible charge related to this service. The patient expressed understanding and agreed to proceed.  Patient consented to consult via telephone: Yes People present and their role in pt care: Pt   History of Present Illness:  43 year old female number smoker followed in our office for sarcoidosis, recurrent bronchitis  PMH: Morbid obesity, GERD, Smoker/ Smoking History: Former Smoker 2014. Roommate Smokes Maintenance:  Symbicort 160 Pt of: Dr. Lamonte Sakai   Chief complaint: Received Covid test today, persistent temperature  43 year old female followed in our office for sarcoidosis as well as recurrent bronchitis.  Patient was treated a week ago as an acute sinusitis via virtual visit.  It was explained at that point in time that with patient's symptoms if they are not improving with bacterial intervention she will need to receive an outpatient Covid test.  Patient contacted our office on 09/04/2019 to report that symptoms had not improved and she had received an outpatient Covid test today.  Patient reports that over the weekend she lost her sense of taste and smell.  She continues to have persistent fevers.  Her temperature today was 102.  She continues to have cough, wheezing, fatigue.  She has been taking Tylenol as well as children's Tylenol to help with management of fever.  She reports that this is gotten her temperatures down  to 99.  She has not started to use ibuprofen.  Observations/Objective:  08/27/2019 - temperature - 100  09/04/2019 - temperature - 102  04/30/2019-SARS-CoV-2-negative  01/04/2019-CT chest high-res- imaging stigmata of sarcoidosis very similar to prior study in 2016, no acute findings  09/26/2017-echocardiogram-LV ejection fraction 60 to 65%, PA pressure 27  09/23/2015-pulmonary function test- FVC 2.71 (82% predicted), postbronchodilator ratio 84, postbronchodilator FEV1 2.31 (86% predicted), no bronchodilator response, DLCO 64  05/04/2019-pulmonary function test-FVC 2.62 (81% predicted close parentheses, postbronchodilator ratio 83, postbronchodilator FEV1 2.23 (85% predicted), no bronchodilator response, DLCO 77  Assessment and Plan:  Suspected COVID-19 virus infection Recent covid test today  Persistent cough  Increased fatigue  Lost taste and smell   Plan: Can use Tylenol 500 mg or ibuprofen 400 mg every 6 hours for management of fever  Can rotate Tylenol and ibuprofen as needed If laying down try to lay on belly If shortness of breath or wheezing worsens or fevers persist and are unable to be controlled please present to the emergency room We will await Covid testing Continue to hydrate well and eat appropriately  Sarcoidosis of lung ([place]) Plan: Continue Dulera 200 Continue rescue inhaler as needed Continue to work diligently on reducing BMI and weight  Allergic rhinitis Plan: Continue Zyrtec Continue Flonase Continue Astelin Nasal saline rinses 1-2 times daily  Follow Up Instructions:  Return in about 4 weeks (around 10/02/2019), or if symptoms worsen or fail to improve, for Follow up with Dr. Lamonte Sakai.    I discussed the assessment and treatment plan with the patient. The patient was provided an opportunity to ask questions and all  were answered. The patient agreed with the plan and demonstrated an understanding of the instructions.   The patient was advised to call  back or seek an in-person evaluation if the symptoms worsen or if the condition fails to improve as anticipated.  I provided 18 minutes of non-face-to-face time during this encounter.   Lauraine Rinne, NP

## 2019-09-04 NOTE — Assessment & Plan Note (Signed)
Plan: Continue Dulera 200 Continue rescue inhaler as needed Continue to work diligently on reducing BMI and weight

## 2019-09-04 NOTE — Assessment & Plan Note (Signed)
Plan: Continue Zyrtec Continue Flonase Continue Astelin Nasal saline rinses 1-2 times daily

## 2019-09-04 NOTE — Telephone Encounter (Signed)
Patient had a video visit with Aaron Edelman this afternoon.

## 2019-09-04 NOTE — Patient Instructions (Signed)
You were seen today by Lauraine Rinne, NP  for:   1. Suspected COVID-19 virus infection  - benzonatate (TESSALON) 200 MG capsule; Take 1 capsule (200 mg total) by mouth 3 (three) times daily as needed for cough.  Dispense: 30 capsule; Refill: 1  Can use Tylenol 500 mg every 6 hours for management of fever Can use ibuprofen 400 mg every 6 hours for management of fever  If symptoms worsen please present to the emergency room for further evaluation   Get help right away if:  You have trouble breathing.  You have pain or pressure in your chest.  You have confusion.  You have bluish lips and fingernails.  You have difficulty waking from sleep.  You have symptoms that get worse.  These symptoms may represent a serious problem that is an emergency. Do not wait to see if the symptoms will go away. Get medical help right away. Call your local emergency services (911 in the [place] Do not drive yourself to the hospital. Let the emergency medical personnel know if you think you have COVID-19.  2. Sarcoidosis of lung ([place])   3. Allergic rhinitis due to pollen, unspecified seasonality    We recommend today:  No orders of the defined types were placed in this encounter.  No orders of the defined types were placed in this encounter.  Meds ordered this encounter  Medications  . benzonatate (TESSALON) 200 MG capsule    Sig: Take 1 capsule (200 mg total) by mouth 3 (three) times daily as needed for cough.    Dispense:  30 capsule    Refill:  1    Follow Up:    Return in about 4 weeks (around 10/02/2019), or if symptoms worsen or fail to improve, for Follow up with Dr. Lamonte Sakai.   Please do your part to reduce the spread of COVID-19:      Reduce your risk of any infection  and COVID19 by using the similar precautions used for avoiding the common cold or flu:  Marland Kitchen Wash your hands often with soap and warm water for at least 20 seconds.  If soap and water are not readily available, use an  alcohol-based hand sanitizer with at least 60% alcohol.  . If coughing or sneezing, cover your mouth and nose by coughing or sneezing into the elbow areas of your shirt or coat, into a tissue or into your sleeve (not your hands). Langley Gauss A MASK when in public  . Avoid shaking hands with others and consider head nods or verbal greetings only. . Avoid touching your eyes, nose, or mouth with unwashed hands.  . Avoid close contact with people who are sick. . Avoid places or events with large numbers of people in one location, like concerts or sporting events. . If you have some symptoms but not all symptoms, continue to monitor at home and seek medical attention if your symptoms worsen. . If you are having a medical emergency, call 911.   [place] / e-Visit: [email or web address]         [facility]: [facility]: [id number]                   [facility]: [id number]     It is flu season:   >>> Best ways to protect herself from the flu: Receive the yearly flu vaccine, practice good hand hygiene washing with soap and  also using hand sanitizer when available, eat a nutritious meals, get adequate rest, hydrate appropriately   Please contact the office if your symptoms worsen or you have concerns that you are not improving.   Thank you for choosing [facility] for your healthcare, and for allowing [place] to partner with you on your healthcare journey. I am thankful to be able to provide care to you today.   Wyn Quaker FNP-C    This information is directly available on the CDC website: RunningShows.co.za.html    Source:CDC Reference to specific commercial products, manufacturers, companies, or trademarks does not constitute its endorsement or recommendation by the [place], [place], or Centers for [employer] and Prevention.

## 2019-09-05 NOTE — Telephone Encounter (Signed)
Patient had a televisit yesterday with Aaron Edelman. She was swabbed for COVID yesterday. The results are not yet back. Will close this encounter.

## 2019-09-06 ENCOUNTER — Ambulatory Visit: Payer: BC Managed Care – PPO | Admitting: Endocrinology

## 2019-09-06 LAB — NOVEL CORONAVIRUS, NAA: SARS-CoV-2, NAA: NOT DETECTED

## 2019-09-06 MED ORDER — DOXYCYCLINE HYCLATE 100 MG PO TABS: 100.0000 mg | ORAL_TABLET | Freq: Two times a day (BID) | ORAL | 0 refills | Status: DC

## 2019-09-06 NOTE — Telephone Encounter (Signed)
On call- reports continuation of flu-like symptoms with cough, back ache. Recent notes reviewed. I called her and suggested doxycycline and otc symptom therapy like Tylenol cold and Flu or etc. She agrees, and will call office back in day time if needed.

## 2019-09-06 NOTE — Telephone Encounter (Signed)
Brian - please advise. Thanks. 

## 2019-09-06 NOTE — Telephone Encounter (Signed)
Duplicate message. 

## 2019-09-06 NOTE — Telephone Encounter (Signed)
Message from NP sent to patient.

## 2019-09-06 NOTE — Telephone Encounter (Signed)
Unsure what else we can do telephonically.  Likely patient needs to present to primary care or our office for in person evaluation.  Or patient can present to an urgent care or emergency room for an in person evaluation.  Reassuring that patient's had a negative Covid test.  There is a high likelihood that this is an acute viral illness that patient is really struggling with dealing with.  Also potentially could be an early case of the flu.  The patient received her flu shot?  Wyn Quaker, FNP

## 2019-09-06 NOTE — Telephone Encounter (Signed)
Error

## 2019-09-07 ENCOUNTER — Ambulatory Visit (INDEPENDENT_AMBULATORY_CARE_PROVIDER_SITE_OTHER): Payer: BC Managed Care – PPO | Admitting: Internal Medicine

## 2019-09-07 ENCOUNTER — Encounter: Payer: Self-pay | Admitting: Internal Medicine

## 2019-09-07 DIAGNOSIS — J069 Acute upper respiratory infection, unspecified: Secondary | ICD-10-CM

## 2019-09-07 DIAGNOSIS — R062 Wheezing: Secondary | ICD-10-CM | POA: Diagnosis not present

## 2019-09-07 DIAGNOSIS — E1140 Type 2 diabetes mellitus with diabetic neuropathy, unspecified: Secondary | ICD-10-CM | POA: Diagnosis not present

## 2019-09-07 DIAGNOSIS — Z794 Long term (current) use of insulin: Secondary | ICD-10-CM | POA: Diagnosis not present

## 2019-09-07 MED ORDER — PREDNISONE 10 MG PO TABS: ORAL_TABLET | ORAL | 0 refills | Status: DC

## 2019-09-07 NOTE — Assessment & Plan Note (Signed)
stable overall by history and exam, recent data reviewed with pt, and pt to continue medical treatment as before,  to f/u any worsening symptoms or concerns  

## 2019-09-07 NOTE — Assessment & Plan Note (Signed)
C/w likely bronchospastic in nature - for predpac asd, cont inhaler prn,  to f/u any worsening symptoms or concerns, gave work note

## 2019-09-07 NOTE — Progress Notes (Signed)
Patient ID: Nicole Melendez, female   DOB: [birth year], 43 y.o.   MRN: [id number]  Virtual Visit via Video Note  I connected with Nicole Melendez on 09/07/19 at  4:00 PM EST by a video enabled telemedicine application and verified that I am speaking with the correct person using two identifiers.  Location: Patient: at home Provider: at office   I discussed the limitations of evaluation and management by telemedicine and the availability of in person appointments. The patient expressed understanding and agreed to proceed.  History of Present Illness:  Here with 6 days acute onset fever, facial pain, pressure, headache, general weakness and malaise, and greenish d/c, with mild ST and cough, but pt denies chest pain, wheezing, increased sob or doe, orthopnea, PND, increased LE swelling, palpitations, dizziness or syncope, except for onset 2 days worsening mild sob/doe and wheezing.  Pt denies new neurological symptoms such as new headache, or facial or extremity weakness or numbness   Pt denies polydipsia, polyuria.  Has been on recent doxycycline but chest symptoms getting worse. Somewhat better with inhaler but has increased use.  Has been unable to work since nov 9. Past Medical History:  Diagnosis Date  . Allergic rhinitis, cause unspecified   . Anemia   . Anxiety   . Depression   . Diabetes mellitus type II    steroid related, patient states "im no longer diabetic"  . Essential hypertension 08/08/2015  . GERD (gastroesophageal reflux disease)   . History of blood transfusion   . Hyperlipidemia   . Menorrhagia   . Migraine   . Morbid obesity ([place])   . Otitis media 06/28/2015  . Peptic ulcer disease   . Positive ANA (antinuclear antibody) 02/14/2012  . Sarcoid    including hand per rheumatology-Dr. Amil Amen  . Sarcoidosis of lung ([place])   . Shortness of breath    on exertion  . Varicose veins with pain    Past Surgical History:  Procedure Laterality Date  . ABDOMINAL HYSTERECTOMY     . COLONOSCOPY WITH PROPOFOL N/A 10/19/2016   Procedure: COLONOSCOPY WITH PROPOFOL;  Surgeon: Mauri Pole, MD;  Location: [facility];  Service: Endoscopy;  Laterality: N/A;  . ESOPHAGOGASTRODUODENOSCOPY (EGD) WITH PROPOFOL N/A 10/19/2016   Procedure: ESOPHAGOGASTRODUODENOSCOPY (EGD) WITH PROPOFOL;  Surgeon: Mauri Pole, MD;  Location: [facility];  Service: Endoscopy;  Laterality: N/A;  . HERNIA MESH REMOVAL  02/2013  . uterine ablation  03/2010  . WISDOM TOOTH EXTRACTION      reports that she quit smoking about 6 years ago. Her smoking use included cigarettes. She has a 20.00 pack-year smoking history. She has never used smokeless tobacco. She reports that she does not drink alcohol or use drugs. family history includes Allergies in her mother; Bone cancer in her maternal aunt; Breast cancer in her maternal aunt; Diabetes in her father and mother; Heart attack in her mother; Heart disease in her father; Hypertension in an other family member; Lung cancer in her maternal aunt; Ovarian cancer in her maternal aunt; Rheum arthritis in her father; Stroke in her father and maternal uncle. Allergies  Allergen Reactions  . Azithromycin Hives    (z pak) hives  . Bee Venom Anaphylaxis  . Flagyl [Metronidazole] Hives and Shortness Of Breath  . Penicillins Shortness Of Breath and Swelling    Has patient had a PCN reaction causing immediate rash, facial/tongue/throat swelling, SOB or lightheadedness with hypotension: Yes Has patient had a PCN reaction causing severe rash involving mucus  membranes or skin necrosis: No Has patient had a PCN reaction that required hospitalization No Has patient had a PCN reaction occurring within the last 10 years: No If all of the above answers are "NO", then may proceed with Cephalosporin use.  REACTION: swelling and difficulty breathing  . Shellfish Allergy Anaphylaxis  . Klonopin [Clonazepam] Other (See Comments)    Memory difficulty   Current  Outpatient Medications on File Prior to Visit  Medication Sig Dispense Refill  . acetaminophen (TYLENOL) 500 MG tablet Take 1 tablet (500 mg total) by mouth every 6 (six) hours as needed. 30 tablet 0  . albuterol (PROVENTIL HFA;VENTOLIN HFA) 108 (90 Base) MCG/ACT inhaler Inhale 2 puffs into the lungs every 4 (four) hours as needed for wheezing or shortness of breath. 1 Inhaler 2  . ALPRAZolam (XANAX) 0.5 MG tablet TAKE 1 TABLET(0.5 MG) BY MOUTH THREE TIMES DAILY AS NEEDED 90 tablet 2  . azelastine (ASTELIN) 0.1 % nasal spray Place 2 sprays into both nostrils 2 (two) times daily. Use in each nostril as directed 30 mL 12  . benzonatate (TESSALON) 200 MG capsule Take 1 capsule (200 mg total) by mouth 3 (three) times daily as needed for cough. 30 capsule 1  . Blood Glucose Monitoring Suppl (ONETOUCH VERIO) w/Device KIT Use as directed daily E11.9 1 kit 0  . cetirizine (ZYRTEC) 10 MG tablet TAKE 1 TABLET(10 MG) BY MOUTH DAILY 90 tablet 1  . Diclofenac Sodium (PENNSAID) 2 % SOLN Apply 1 pump twice daily as needed. 112 g 3  . doxycycline (VIBRA-TABS) 100 MG tablet Take 1 tablet (100 mg total) by mouth 2 (two) times daily. 14 tablet 0  . doxycycline (VIBRA-TABS) 100 MG tablet Take 1 tablet (100 mg total) by mouth 2 (two) times daily. 14 tablet 0  . fluconazole (DIFLUCAN) 150 MG tablet 1 tab by mouth every 3 days as needed 2 tablet 1  . fluticasone (FLONASE) 50 MCG/ACT nasal spray Place 2 sprays into both nostrils daily. 16 g 0  . furosemide (LASIX) 40 MG tablet 1 tab by mouth every day in the AM and then 1 as needed only for persistent swelling in the PM 60 tablet 11  . gabapentin (NEURONTIN) 300 MG capsule TAKE 2 CAPSULES(600 MG) BY MOUTH THREE TIMES DAILY 180 capsule 5  . glucose blood (ONETOUCH VERIO) test strip Use as instructed three times daily E11.9 100 each 12  . guaiFENesin (MUCINEX) 600 MG 12 hr tablet Take 2 tablets (1,200 mg total) by mouth 2 (two) times daily as needed. 40 tablet 1  .  hydrOXYzine (ATARAX/VISTARIL) 10 MG tablet TAKE 1 TABLET(10 MG) BY MOUTH THREE TIMES DAILY AS NEEDED (Patient taking differently: Take 10 mg by mouth 3 (three) times daily as needed for anxiety. ) 60 tablet 0  . Insulin Lispro (HUMALOG KWIKPEN) 200 UNIT/ML SOPN Inject 12 Units into the skin 4 (four) times daily. WITH MEALS W/ SLIDING SCALE ASD E11.9 5 pen 11  . Insulin Pen Needle (BD PEN NEEDLE NANO U/F) 32G X 4 MM MISC Use as directed three times daily E11.9 300 each 3  . levalbuterol (XOPENEX HFA) 45 MCG/ACT inhaler Inhale 1-2 puffs into the lungs every 8 (eight) hours as needed for wheezing or shortness of breath. USE 2 PUFFS 3 TIMES DAILY X 5 DAYS THEN BACK TO HOME REGIMEN AS NEEDED. 1 Inhaler 0  . levofloxacin (LEVAQUIN) 500 MG tablet Take 1 tablet (500 mg total) by mouth daily. 7 tablet 0  . lidocaine (LIDODERM)  5 % Place 1 patch onto the skin daily. Remove & Discard patch within 12 hours or as directed by MD 60 patch 2  . mometasone-formoterol (DULERA) 200-5 MCG/ACT AERO Inhale 2 puffs into the lungs 2 (two) times a day. 8.8 g 5  . omeprazole (PRILOSEC) 40 MG capsule TAKE 1 CAPSULE(40 MG) BY MOUTH DAILY 90 capsule 1  . ondansetron (ZOFRAN) 4 MG tablet Take 1 tablet (4 mg total) by mouth every 6 (six) hours. 30 tablet 0  . oxyCODONE-acetaminophen (PERCOCET) 10-325 MG tablet Take 1 tablet by mouth every 8 (eight) hours as needed for pain.   0  . potassium chloride (K-DUR) 10 MEQ tablet 3 tab by mouth per day only when taking the lasix 90 tablet 11  . rizatriptan (MAXALT-MLT) 10 MG disintegrating tablet Take 1 tablet earliest onset of migraine.  May repeat in 2 hours if needed.  Maximum 2 tablets in 24 hours 9 tablet 3  . rosuvastatin (CRESTOR) 40 MG tablet TAKE 1 TABLET(40 MG) BY MOUTH DAILY 90 tablet 1  . sertraline (ZOLOFT) 100 MG tablet TAKE 2 TABLETS(200 MG) BY MOUTH DAILY 180 tablet 1  . telmisartan (MICARDIS) 40 MG tablet TAKE 1 TABLET(40 MG) BY MOUTH DAILY 90 tablet 0  . TURMERIC PO Take 1  tablet by mouth daily.    . vitamin B-12 (CYANOCOBALAMIN) 1000 MCG tablet Take 1,000 mcg by mouth daily.    . Vitamin D, Ergocalciferol, (DRISDOL) 50000 units CAPS capsule TAKE 1 CAPSULE BY MOUTH EVERY 7 DAYS (Patient taking differently: Take 50,000 Units by mouth every 7 (seven) days. ) 12 capsule 0  . zolpidem (AMBIEN) 10 MG tablet Take 1 tablet (10 mg total) by mouth at bedtime as needed for sleep. 90 tablet 1  . zonisamide (ZONEGRAN) 100 MG capsule Take 2 capsules (200 mg total) by mouth daily. 60 capsule 3   No current facility-administered medications on file prior to visit.     Observations/Objective: Alert, NAD, appropriate mood and affect, resps normal, cn 2-12 intact, moves all 4s, no visible rash or swelling Lab Results  Component Value Date   WBC 6.6 07/27/2019   HGB 13.1 07/27/2019   HCT 39.4 07/27/2019   PLT 276.0 07/27/2019   GLUCOSE 123 (H) 07/27/2019   CHOL 142 07/27/2019   TRIG 98.0 07/27/2019   HDL 47.80 07/27/2019   LDLDIRECT 135.0 07/05/2011   LDLCALC 75 07/27/2019   ALT 12 07/27/2019   AST 13 07/27/2019   NA 139 07/27/2019   K 4.0 07/27/2019   CL 107 07/27/2019   CREATININE 0.74 07/27/2019   BUN 9 07/27/2019   CO2 25 07/27/2019   TSH 1.60 07/27/2019   INR 1.3 11/17/2012   HGBA1C 6.7 (H) 07/27/2019   MICROALBUR <0.7 07/27/2019   Assessment and Plan: See notes  Follow Up Instructions: See notes   I discussed the assessment and treatment plan with the patient. The patient was provided an opportunity to ask questions and all were answered. The patient agreed with the plan and demonstrated an understanding of the instructions.   The patient was advised to call back or seek an in-person evaluation if the symptoms worsen or if the condition fails to improve as anticipated.   Cathlean Cower, MD

## 2019-09-07 NOTE — Telephone Encounter (Signed)
Per note from Dr. Annamaria Boots.  Patient was treated with doxycycline.  Please ensure patient has follow-up scheduled in office with Dr. Lamonte Sakai or APP sometime over the next 6 to 8 weeks.  Wyn Quaker, FNP

## 2019-09-07 NOTE — Assessment & Plan Note (Signed)
Mild to mod, to finish antibx course,  mucinex prn, to f/u any worsening symptoms or concerns

## 2019-09-07 NOTE — Patient Instructions (Signed)
Please take all new medication as prescribed - the prednisone  OK for the work excuse letter  Please continue all other medications as before, and refills have been done if requested.  Please have the pharmacy call with any other refills you may need.  Please continue your efforts at being more active, low cholesterol diet, and weight control..  Please keep your appointments with your specialists as you may have planned

## 2019-09-10 DIAGNOSIS — M069 Rheumatoid arthritis, unspecified: Secondary | ICD-10-CM | POA: Diagnosis not present

## 2019-09-10 DIAGNOSIS — Z79899 Other long term (current) drug therapy: Secondary | ICD-10-CM | POA: Diagnosis not present

## 2019-09-10 DIAGNOSIS — M79606 Pain in leg, unspecified: Secondary | ICD-10-CM | POA: Diagnosis not present

## 2019-09-10 DIAGNOSIS — M47817 Spondylosis without myelopathy or radiculopathy, lumbosacral region: Secondary | ICD-10-CM | POA: Diagnosis not present

## 2019-09-10 DIAGNOSIS — Z79891 Long term (current) use of opiate analgesic: Secondary | ICD-10-CM | POA: Diagnosis not present

## 2019-09-10 DIAGNOSIS — G894 Chronic pain syndrome: Secondary | ICD-10-CM | POA: Diagnosis not present

## 2019-09-12 ENCOUNTER — Encounter: Payer: Self-pay | Admitting: Internal Medicine

## 2019-09-17 NOTE — Progress Notes (Signed)
Virtual Visit via Video Note The purpose of this virtual visit is to provide medical care while limiting exposure to the novel coronavirus.    Consent was obtained for video visit:  Yes.   Answered questions that patient had about telehealth interaction:  Yes.   I discussed the limitations, risks, security and privacy concerns of performing an evaluation and management service by telemedicine. I also discussed with the patient that there may be a patient responsible charge related to this service. The patient expressed understanding and agreed to proceed.  Pt location: Home Physician Location: office Name of referring provider:  Biagio Borg, MD I connected with Nicole Melendez at patients initiation/request on 09/18/2019 at  8:30 AM EST by video enabled telemedicine application and verified that I am speaking with the correct person using two identifiers. Pt MRN:  [id number] Pt DOB:  1976-01-12 Video Participants:  Nicole Melendez   History of Present Illness:  Nicole Melendez is a 43 year old right-handed female with sarcoidosis, diabetes with neuropathy, GERD, PUD, HTN, and hyperlipidemia who follows up for migraine and lumbar radiculopathy.  MIGRAINE: Update: Zonisamide was increased last visit.  Sumatriptan changed to rizatriptan. Intensity:  7/10 Duration:  Usually 1 hour with rizatriptan 21m. Frequency:  Only 4 or 5 since July.    Frequency of abortive medication: only 4 times over last 9 months. Current NSAIDS: None (has PUD) Current analgesics: Tylenol Current triptans: rizatriptan 181mCurrent ergotamine: None Current anti-emetic: Zofran 4 mg Current muscle relaxants: None Current anti-anxiolytic: Alprazolam, hydroxyzine Current sleep aide: None Current Antihypertensive medications: Lasix Current Antidepressant medications: Sertraline Current Anticonvulsant medications: Zonisamide 200 mg, gabapentin 300 mg 3 times daily  Current anti-CGRP: None Current  Vitamins/Herbal/Supplements: Folic acid, D3 Current Antihistamines/Decongestants: Zyrtec Other therapy: None Hormone/birth control: None  Caffeine: No Alcohol: No Smoker: No Diet: Hydrates, baked chicken, steamed vegetables Exercise: Yes Depression: Yes; Anxiety: Yes.  Both controlled. Other pain: back pain Sleep hygiene: Good  History: Onset:  Mid-30s Location: Back of head, right sided Quality: pounding Initial Intensity: 8-9/10 Aura: no Prodrome: no Associated symptoms: Nausea, vomiting, osmophobia, right eye blurred vision, sometimes photophobia. There is no associated unilateral numbness or weakness. Initial Duration: 2 days (within one hour with sumatriptan) Initial Frequency: Varies. Some months 1-2 days, some months 10 days Triggers:  Unknown Relieving factors: no Activity: Cannot function 1 to 2 days per month  Past NSAIDS: Ibuprofen, naproxen (discontinued due to stomach ulcers) Past analgesics: Percocet, tramadol (for back pain) Past abortive triptans: sumatriptan 10081mast muscle relaxants: tizanidine 4mg75mst anti-emetic: Phenergan Past antihypertensive medications: no Past antidepressant medications: Wellbutrin Past anticonvulsant medications: topiramate (caused nausea and vomiting) Past vitamins/Herbal/Supplements: no Other past therapies: meditation Family history of headache: mother  RIGHT LUMBAR RADICULOPATHY: Update: Repeat NCV-EMG was ordered but not performed because she was sick at that time.   Still have weakness and balance issues.  She reports some improvement in weakness.  She has been using heat and ice, which helps soothe it.  No falls.  She needs to use a cane if she walks over 50 yards.  She has not been in PT but has been performing home exercises and losing weight.    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  On 03/14/19, she underwent radiofrequency neurolysis of the left  lumbar facet nerves at L2-L3, L3-L4, L4-L5 and L5-S1 levels.  She had no complications.  On 04/23/19, she underwent the same procedure on the right  side at the same levels. She noted some right leg weakness afterwards with some pain.  It progressively got worse.  When she is on her feet, the right lower back pain increases and her leg gives out but no leg pain.  No numbness in right leg but notes new numbness in the big toe of the left foot.  Due to the weakness, she started having falls.  She also notes that sometimes when wakes up in the morning, she has trouble making it to the bathroom to urinate.  She has had incontinence.  No perineal numbness.  She now resorts to using a cane to ambulate.  She had NCV-EMG of the lower extremities on 05/22/19, which demonstrated diminished amplitude of both tibaial motor nerves and absent F-wave response in the right peroneal motor nerve but otherwise unremarkable with no explanation for symptoms.  MRI of lumbar spine without contrast on 06/09/19 showed stable degenerative disc disease and facet arthropathy with mild bilateral neuroforaminal stenosis at L4-L5 and moderate neuroforaminal stenosis at L5-S1, but no new structural explanation for symptoms.  Since this started, she reports increased migraines.    Past Medical History: Past Medical History:  Diagnosis Date  . Allergic rhinitis, cause unspecified   . Anemia   . Anxiety   . Depression   . Diabetes mellitus type II    steroid related, patient states "im no longer diabetic"  . Essential hypertension 08/08/2015  . GERD (gastroesophageal reflux disease)   . History of blood transfusion   . Hyperlipidemia   . Menorrhagia   . Migraine   . Morbid obesity ([place])   . Otitis media 06/28/2015  . Peptic ulcer disease   . Positive ANA (antinuclear antibody) 02/14/2012  . Sarcoid    including hand per rheumatology-Dr. Amil Amen  . Sarcoidosis of lung ([place])   . Shortness of breath    on exertion  . Varicose veins  with pain     Medications: Outpatient Encounter Medications as of 09/18/2019  Medication Sig Note  . acetaminophen (TYLENOL) 500 MG tablet Take 1 tablet (500 mg total) by mouth every 6 (six) hours as needed.   Marland Kitchen albuterol (PROVENTIL HFA;VENTOLIN HFA) 108 (90 Base) MCG/ACT inhaler Inhale 2 puffs into the lungs every 4 (four) hours as needed for wheezing or shortness of breath.   . ALPRAZolam (XANAX) 0.5 MG tablet TAKE 1 TABLET(0.5 MG) BY MOUTH THREE TIMES DAILY AS NEEDED   . azelastine (ASTELIN) 0.1 % nasal spray Place 2 sprays into both nostrils 2 (two) times daily. Use in each nostril as directed   . benzonatate (TESSALON) 200 MG capsule Take 1 capsule (200 mg total) by mouth 3 (three) times daily as needed for cough.   . Blood Glucose Monitoring Suppl (ONETOUCH VERIO) w/Device KIT Use as directed daily E11.9   . cetirizine (ZYRTEC) 10 MG tablet TAKE 1 TABLET(10 MG) BY MOUTH DAILY   . Diclofenac Sodium (PENNSAID) 2 % SOLN Apply 1 pump twice daily as needed.   . doxycycline (VIBRA-TABS) 100 MG tablet Take 1 tablet (100 mg total) by mouth 2 (two) times daily.   Marland Kitchen doxycycline (VIBRA-TABS) 100 MG tablet Take 1 tablet (100 mg total) by mouth 2 (two) times daily.   . fluconazole (DIFLUCAN) 150 MG tablet 1 tab by mouth every 3 days as needed   . fluticasone (FLONASE) 50 MCG/ACT nasal spray Place 2 sprays into both nostrils daily.   . furosemide (LASIX) 40 MG tablet 1 tab by mouth every day in the  AM and then 1 as needed only for persistent swelling in the PM   . gabapentin (NEURONTIN) 300 MG capsule TAKE 2 CAPSULES(600 MG) BY MOUTH THREE TIMES DAILY   . glucose blood (ONETOUCH VERIO) test strip Use as instructed three times daily E11.9   . guaiFENesin (MUCINEX) 600 MG 12 hr tablet Take 2 tablets (1,200 mg total) by mouth 2 (two) times daily as needed.   . hydrOXYzine (ATARAX/VISTARIL) 10 MG tablet TAKE 1 TABLET(10 MG) BY MOUTH THREE TIMES DAILY AS NEEDED (Patient taking differently: Take 10 mg by  mouth 3 (three) times daily as needed for anxiety. ) 06/25/2019: States does not take if at work - only if at home  . Insulin Lispro (HUMALOG KWIKPEN) 200 UNIT/ML SOPN Inject 12 Units into the skin 4 (four) times daily. WITH MEALS W/ SLIDING SCALE ASD E11.9   . Insulin Pen Needle (BD PEN NEEDLE NANO U/F) 32G X 4 MM MISC Use as directed three times daily E11.9   . levalbuterol (XOPENEX HFA) 45 MCG/ACT inhaler Inhale 1-2 puffs into the lungs every 8 (eight) hours as needed for wheezing or shortness of breath. USE 2 PUFFS 3 TIMES DAILY X 5 DAYS THEN BACK TO HOME REGIMEN AS NEEDED.   Marland Kitchen levofloxacin (LEVAQUIN) 500 MG tablet Take 1 tablet (500 mg total) by mouth daily.   Marland Kitchen lidocaine (LIDODERM) 5 % Place 1 patch onto the skin daily. Remove & Discard patch within 12 hours or as directed by MD   . mometasone-formoterol (DULERA) 200-5 MCG/ACT AERO Inhale 2 puffs into the lungs 2 (two) times a day.   Marland Kitchen omeprazole (PRILOSEC) 40 MG capsule TAKE 1 CAPSULE(40 MG) BY MOUTH DAILY   . ondansetron (ZOFRAN) 4 MG tablet Take 1 tablet (4 mg total) by mouth every 6 (six) hours.   Marland Kitchen oxyCODONE-acetaminophen (PERCOCET) 10-325 MG tablet Take 1 tablet by mouth every 8 (eight) hours as needed for pain.    . potassium chloride (K-DUR) 10 MEQ tablet 3 tab by mouth per day only when taking the lasix   . predniSONE (DELTASONE) 10 MG tablet 3 tabs by mouth per day for 3 days,2tabs per day for 3 days,1tab per day for 3 days   . rizatriptan (MAXALT-MLT) 10 MG disintegrating tablet Take 1 tablet earliest onset of migraine.  May repeat in 2 hours if needed.  Maximum 2 tablets in 24 hours   . rosuvastatin (CRESTOR) 40 MG tablet TAKE 1 TABLET(40 MG) BY MOUTH DAILY   . sertraline (ZOLOFT) 100 MG tablet TAKE 2 TABLETS(200 MG) BY MOUTH DAILY   . telmisartan (MICARDIS) 40 MG tablet TAKE 1 TABLET(40 MG) BY MOUTH DAILY   . TURMERIC PO Take 1 tablet by mouth daily.   . vitamin B-12 (CYANOCOBALAMIN) 1000 MCG tablet Take 1,000 mcg by mouth daily.    . Vitamin D, Ergocalciferol, (DRISDOL) 50000 units CAPS capsule TAKE 1 CAPSULE BY MOUTH EVERY 7 DAYS (Patient taking differently: Take 50,000 Units by mouth every 7 (seven) days. )   . zolpidem (AMBIEN) 10 MG tablet Take 1 tablet (10 mg total) by mouth at bedtime as needed for sleep.   Marland Kitchen zonisamide (ZONEGRAN) 100 MG capsule Take 2 capsules (200 mg total) by mouth daily.    No facility-administered encounter medications on file as of 09/18/2019.     Allergies: Allergies  Allergen Reactions  . Azithromycin Hives    (z pak) hives  . Bee Venom Anaphylaxis  . Flagyl [Metronidazole] Hives and Shortness Of Breath  . Penicillins  Shortness Of Breath and Swelling    Has patient had a PCN reaction causing immediate rash, facial/tongue/throat swelling, SOB or lightheadedness with hypotension: Yes Has patient had a PCN reaction causing severe rash involving mucus membranes or skin necrosis: No Has patient had a PCN reaction that required hospitalization No Has patient had a PCN reaction occurring within the last 10 years: No If all of the above answers are "NO", then may proceed with Cephalosporin use.  REACTION: swelling and difficulty breathing  . Shellfish Allergy Anaphylaxis  . Klonopin [Clonazepam] Other (See Comments)    Memory difficulty    Family History: Family History  Problem Relation Age of Onset  . Allergies Mother   . Heart attack Mother   . Diabetes Mother   . Diabetes Father   . Heart disease Father   . Rheum arthritis Father   . Stroke Father   . Hypertension Other   . Ovarian cancer Maternal Aunt   . Lung cancer Maternal Aunt   . Breast cancer Maternal Aunt   . Bone cancer Maternal Aunt   . Stroke Maternal Uncle   . Other Neg Hx     Social History: Social History   Socioeconomic History  . Marital status: Single    Spouse name: Not on file  . Number of children: 0  . Years of education: Not on file  . Highest education level: Not on file  Occupational  History  . Occupation: Therapist, art  Social Needs  . Financial resource strain: Not on file  . Food insecurity    Worry: Not on file    Inability: Not on file  . Transportation needs    Medical: Not on file    Non-medical: Not on file  Tobacco Use  . Smoking status: Former Smoker    Packs/day: 1.00    Years: 20.00    Pack years: 20.00    Types: Cigarettes    Quit date: 10/25/2012    Years since quitting: 6.8  . Smokeless tobacco: Never Used  . Tobacco comment: QUIT 04/2010 AND STARTED BACK 2014 X 3 MONTHS. less than 1 ppd.  started at age 80.    Substance and Sexual Activity  . Alcohol use: No  . Drug use: No  . Sexual activity: Yes    Birth control/protection: None  Lifestyle  . Physical activity    Days per week: Not on file    Minutes per session: Not on file  . Stress: Not on file  Relationships  . Social Herbalist on phone: Not on file    Gets together: Not on file    Attends religious service: Not on file    Active member of club or organization: Not on file    Attends meetings of clubs or organizations: Not on file    Relationship status: Not on file  . Intimate partner violence    Fear of current or ex partner: Not on file    Emotionally abused: Not on file    Physically abused: Not on file    Forced sexual activity: Not on file  Other Topics Concern  . Not on file  Social History Narrative   Pt lives with [name]- also pt of [facility]      Has 2 bachelors/1 masters; is working; R handed - one level home; R handed; rare caffeine;        Observations/Objective:   Height _0  (1.702 m), weight (!) 322 lb (146.1 kg),  last menstrual period 07/17/2012. No acute distress.  Alert and oriented.  Speech fluent and not dysarthric.  Language intact.  Eyes orthophoric on primary gaze.  Face symmetric.  Assessment and Plan:   1.  Migraine without aura, without status migrainosus, not intractable 2.  Right lumbar radiculitis following radiofrequency  neurolysis.    1. Will reschedule NCV-EMG of right lower extremity.  2. For migraine preventative management, zonisamide 275m daily 3.  For migraine abortive therapy, rizatriptan 171m4.  Limit use of pain relievers to no more than 2 days out of week to prevent risk of rebound or medication-overuse headache. 5.  Keep headache diary 6.  Exercise, hydration, caffeine cessation, sleep hygiene, monitor for and avoid triggers 7.  Consider:  magnesium citrate 40012maily, riboflavin 400m51mily, and coenzyme Q10 100mg77mee times daily 8. Follow up after NCV-EMG   Follow Up Instructions:    -I discussed the assessment and treatment plan with the patient. The patient was provided an opportunity to ask questions and all were answered. The patient agreed with the plan and demonstrated an understanding of the instructions.   The patient was advised to call back or seek an in-person evaluation if the symptoms worsen or if the condition fails to improve as anticipated.    Harjot Dibello Dudley Major

## 2019-09-18 ENCOUNTER — Other Ambulatory Visit: Payer: Self-pay

## 2019-09-18 ENCOUNTER — Encounter: Payer: Self-pay | Admitting: Neurology

## 2019-09-18 ENCOUNTER — Telehealth (INDEPENDENT_AMBULATORY_CARE_PROVIDER_SITE_OTHER): Payer: BC Managed Care – PPO | Admitting: Neurology

## 2019-09-18 VITALS — Ht 67.0 in | Wt 322.0 lb

## 2019-09-18 DIAGNOSIS — M5416 Radiculopathy, lumbar region: Secondary | ICD-10-CM

## 2019-09-18 DIAGNOSIS — G43009 Migraine without aura, not intractable, without status migrainosus: Secondary | ICD-10-CM

## 2019-09-18 NOTE — Addendum Note (Signed)
Addended by: Ranae Plumber on: 09/18/2019 10:26 AM   Modules accepted: Orders

## 2019-09-25 DIAGNOSIS — H40012 Open angle with borderline findings, low risk, left eye: Secondary | ICD-10-CM | POA: Diagnosis not present

## 2019-09-25 LAB — HM DIABETES EYE EXAM

## 2019-10-05 ENCOUNTER — Ambulatory Visit: Payer: BC Managed Care – PPO | Admitting: Endocrinology

## 2019-10-08 DIAGNOSIS — Z79899 Other long term (current) drug therapy: Secondary | ICD-10-CM | POA: Diagnosis not present

## 2019-10-08 DIAGNOSIS — G894 Chronic pain syndrome: Secondary | ICD-10-CM | POA: Diagnosis not present

## 2019-10-08 DIAGNOSIS — M069 Rheumatoid arthritis, unspecified: Secondary | ICD-10-CM | POA: Diagnosis not present

## 2019-10-08 DIAGNOSIS — D869 Sarcoidosis, unspecified: Secondary | ICD-10-CM | POA: Diagnosis not present

## 2019-10-08 DIAGNOSIS — Z79891 Long term (current) use of opiate analgesic: Secondary | ICD-10-CM | POA: Diagnosis not present

## 2019-10-08 DIAGNOSIS — M47817 Spondylosis without myelopathy or radiculopathy, lumbosacral region: Secondary | ICD-10-CM | POA: Diagnosis not present

## 2019-10-09 ENCOUNTER — Telehealth: Payer: Self-pay | Admitting: Emergency Medicine

## 2019-10-09 NOTE — Telephone Encounter (Signed)
Called and spoke to patient. Patient stated that she doesn't have any current sick symptoms. Patient stated that last month she had symptoms but is feeling better. Patient stated she called to make a follow up appointment because she hasn't seen Dr. Lamonte Sakai in a long time. Patient requested a regular office visit.  Scheduled patient for visit on 11/07/2019.  Nothing further needed at this time.

## 2019-10-10 ENCOUNTER — Ambulatory Visit (INDEPENDENT_AMBULATORY_CARE_PROVIDER_SITE_OTHER): Payer: BC Managed Care – PPO

## 2019-10-10 ENCOUNTER — Ambulatory Visit (HOSPITAL_COMMUNITY)
Admission: EM | Admit: 2019-10-10 | Discharge: 2019-10-10 | Disposition: A | Discharge status: Home / Self Care | Payer: BC Managed Care – PPO | Attending: Family Medicine | Admitting: Family Medicine

## 2019-10-10 ENCOUNTER — Encounter (HOSPITAL_COMMUNITY): Payer: Self-pay

## 2019-10-10 DIAGNOSIS — M7989 Other specified soft tissue disorders: Secondary | ICD-10-CM | POA: Diagnosis not present

## 2019-10-10 DIAGNOSIS — S99921A Unspecified injury of right foot, initial encounter: Secondary | ICD-10-CM

## 2019-10-10 DIAGNOSIS — W1789XA Other fall from one level to another, initial encounter: Secondary | ICD-10-CM | POA: Diagnosis not present

## 2019-10-10 DIAGNOSIS — M79671 Pain in right foot: Secondary | ICD-10-CM | POA: Diagnosis not present

## 2019-10-10 NOTE — ED Triage Notes (Signed)
Pt reports she did not have her cane with her and she fell of her porch. Pt states having right foot pain x 1 day.

## 2019-10-10 NOTE — Discharge Instructions (Signed)
No fractures of the foot but there is some concern for ligament injury.  We will place you in a walking boot and have you continue to rest, ice and elevate.  Call ortho tomorrow for follow up.

## 2019-10-10 NOTE — ED Provider Notes (Signed)
[place]    CSN: [id number] Arrival date & time: 10/10/19  1355      History   Chief Complaint Chief Complaint  Patient presents with   Fall    HPI Nicole Melendez is a 43 y.o. female.   Patient is a 43 year old female presents today for right foot pain after fall yesterday reporting yesterday she was walking off the porch and tripped on something twisting the right foot and landing arborization.  States she has pain to the top of her foot.  no ankle pain or swelling.  Has had multiple fractures to the same foot before.  She has been taking Percocet for the pain and ice the foot and elevate last night.  No numbness, tingling.  She has been ambulating on the foot.  ROS per HPI    Fall    Past Medical History:  Diagnosis Date   Allergic rhinitis, cause unspecified    Anemia    Anxiety    Depression    Diabetes mellitus type II    steroid related, patient states "im no longer diabetic"   Essential hypertension 08/08/2015   GERD (gastroesophageal reflux disease)    History of blood transfusion    Hyperlipidemia    Menorrhagia    Migraine    Morbid obesity ([place])    Otitis media 06/28/2015   Peptic ulcer disease    Positive ANA (antinuclear antibody) 02/14/2012   Sarcoid    including hand per rheumatology-Dr. 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[facility])    Shortness of breath    on exertion   Varicose veins with pain     Patient Active Problem List   Diagnosis Date Noted   URI (upper respiratory infection) 08/12/2019   Suspected COVID-19 virus infection 08/02/2019   Hematochezia 04/05/2019   Snoring 11/17/2018   Eye symptom 08/03/2018   Right lumbar radiculopathy 05/08/2018   Chronic pain 10/11/2017   Sarcoidosis of lung ([place])    Acute on chronic diastolic CHF (congestive heart failure) ([place]) 09/22/2017   Rheumatoid arthritis ([place]) 09/18/2017   Chronic diastolic CHF (congestive heart failure) ([place]) 09/18/2017    Pancreatitis 06/26/2017   Right ankle sprain 06/26/2017   Oral thrush 06/22/2017   Possible exposure to STD 06/15/2017   Diarrhea 04/10/2017   Bilateral hand swelling 04/07/2017   Abdominal pain 04/07/2017   Abnormality of gait 01/26/2017   Avulsion fracture of right ankle 12/01/2016   Ulcer of esophagus with bleeding    Gastritis and gastroduodenitis    Blepharitis of right upper eyelid 10/09/2016   Peroneal tendinitis of lower leg, right 10/06/2016   Right ankle pain 09/16/2016   Dizziness 03/11/2016   Mixed headache 03/09/2016   Chronic venous insufficiency 02/24/2016   Gastroenteritis 12/30/2015   Headache 12/30/2015   Epistaxis 12/09/2015   Post concussive syndrome 12/03/2015   Cervical muscle strain 12/03/2015   Right-sided face pain 11/26/2015   Multiple contusions 11/26/2015   Neck pain, bilateral posterior 11/26/2015   Low back pain 11/26/2015   Spinal stenosis of lumbar region 10/28/2015   Chronic sciatica of left side 10/28/2015   DOE (dyspnea on exertion) 09/02/2015   Acute bronchitis 08/22/2015   Essential hypertension 08/08/2015   Diaphoresis 08/07/2015   Cough 07/02/2015   Vaginitis and vulvovaginitis 06/29/2015   Otitis media 06/28/2015   Thrush 06/24/2015   Nausea & vomiting 06/10/2015   Acute upper respiratory infection 11/14/2014   Wheezing 11/14/2014   Angioedema 03/19/2014   Acute maxillary sinusitis 01/08/2013  Vaginitis 11/19/2012   Smoker 11/19/2012   Positive ANA (antinuclear antibody) 02/14/2012   Polyarthritis 02/11/2012   Migraine 04/05/2011   Encounter for well adult exam with abnormal findings 02/21/2011   Allergic rhinitis 02/17/2011   URTICARIA 09/29/2010   Depression 09/14/2010   Diabetes mellitus with neuropathy ([place]) 07/31/2010   PERIPHERAL EDEMA 07/31/2010   INSOMNIA-SLEEP DISORDER-UNSPEC 06/19/2010   PULMONARY SARCOIDOSIS 06/08/2010   MENORRHAGIA 05/15/2010    Hyperlipidemia 12/17/2009   Iron deficiency anemia 12/17/2009   Anxiety state 12/17/2009   Morbid obesity ([place]) 07/02/2007   Gastroesophageal reflux disease 07/02/2007   PEPTIC ULCER DISEASE 07/02/2007    Past Surgical History:  Procedure Laterality Date   ABDOMINAL HYSTERECTOMY     COLONOSCOPY WITH PROPOFOL N/A 10/19/2016   Procedure: COLONOSCOPY WITH PROPOFOL;  Surgeon: Mauri Pole, MD;  Location: [facility];  Service: Endoscopy;  Laterality: N/A;   ESOPHAGOGASTRODUODENOSCOPY (EGD) WITH PROPOFOL N/A 10/19/2016   Procedure: ESOPHAGOGASTRODUODENOSCOPY (EGD) WITH PROPOFOL;  Surgeon: Mauri Pole, MD;  Location: [facility];  Service: Endoscopy;  Laterality: N/A;   HERNIA MESH REMOVAL  02/2013   uterine ablation  03/2010   WISDOM TOOTH EXTRACTION      OB History   No obstetric history on file.      Home Medications    Prior to Admission medications   Medication Sig Start Date End Date Taking? Authorizing Provider  acetaminophen (TYLENOL) 500 MG tablet Take 1 tablet (500 mg total) by mouth every 6 (six) hours as needed. 11/23/18   Lauraine Rinne, NP  albuterol (PROVENTIL HFA;VENTOLIN HFA) 108 (90 Base) MCG/ACT inhaler Inhale 2 puffs into the lungs every 4 (four) hours as needed for wheezing or shortness of breath. 11/17/18   Byrum, Rose Fillers, MD  ALPRAZolam Duanne Moron) 0.5 MG tablet TAKE 1 TABLET(0.5 MG) BY MOUTH THREE TIMES DAILY AS NEEDED 07/20/19   Biagio Borg, MD  azelastine (ASTELIN) 0.1 % nasal spray Place 2 sprays into both nostrils 2 (two) times daily. Use in each nostril as directed 03/11/16   Biagio Borg, MD  benzonatate (TESSALON) 200 MG capsule Take 1 capsule (200 mg total) by mouth 3 (three) times daily as needed for cough. 09/04/19   Lauraine Rinne, NP  Blood Glucose Monitoring [facility] VERIO) w/Device KIT Use as directed daily E11.9 08/08/19   Biagio Borg, MD  cetirizine (ZYRTEC) 10 MG tablet TAKE 1 TABLET(10 MG) BY MOUTH DAILY 03/05/19    Biagio Borg, MD  Diclofenac Sodium (PENNSAID) 2 % SOLN Apply 1 pump twice daily as needed. 08/31/18   Biagio Borg, MD  doxycycline (VIBRA-TABS) 100 MG tablet Take 1 tablet (100 mg total) by mouth 2 (two) times daily. 08/02/19   Hoyt Koch, MD  doxycycline (VIBRA-TABS) 100 MG tablet Take 1 tablet (100 mg total) by mouth 2 (two) times daily. 09/06/19   Deneise Lever, MD  fluconazole (DIFLUCAN) 150 MG tablet 1 tab by mouth every 3 days as needed 08/27/19   Biagio Borg, MD  fluticasone [facility]) 50 MCG/ACT nasal spray Place 2 sprays into both nostrils daily. 09/23/17   Eugenie Filler, MD  furosemide (LASIX) 40 MG tablet 1 tab by mouth every day in the AM and then 1 as needed only for persistent swelling in the PM 06/16/16   Biagio Borg, MD  gabapentin (NEURONTIN) 300 MG capsule TAKE 2 CAPSULES(600 MG) BY MOUTH THREE TIMES DAILY 03/05/19   Biagio Borg, MD  glucose blood [facility]  VERIO) test strip Use as instructed three times daily E11.9 08/08/19   Biagio Borg, MD  hydrOXYzine (ATARAX/VISTARIL) 10 MG tablet TAKE 1 TABLET(10 MG) BY MOUTH THREE TIMES DAILY AS NEEDED Patient taking differently: Take 10 mg by mouth 3 (three) times daily as needed for anxiety.  11/17/18   Biagio Borg, MD  Insulin Lispro (HUMALOG KWIKPEN) 200 UNIT/ML SOPN Inject 12 Units into the skin 4 (four) times daily. WITH MEALS W/ SLIDING SCALE ASD E11.9 07/27/19   Biagio Borg, MD  Insulin Pen Needle (BD PEN NEEDLE NANO U/F) 32G X 4 MM MISC Use as directed three times daily E11.9 08/08/19   Biagio Borg, MD  levalbuterol [facility] HFA) 45 MCG/ACT inhaler Inhale 1-2 puffs into the lungs every 8 (eight) hours as needed for wheezing or shortness of breath. USE 2 PUFFS 3 TIMES DAILY X 5 DAYS THEN BACK TO HOME REGIMEN AS NEEDED. 09/22/17   Eugenie Filler, MD  levofloxacin (LEVAQUIN) 500 MG tablet Take 1 tablet (500 mg total) by mouth daily. 08/27/19   Lauraine Rinne, NP  lidocaine (LIDODERM) 5 % Place 1 patch onto  the skin daily. Remove & Discard patch within 12 hours or as directed by MD 08/31/18   Biagio Borg, MD  mometasone-formoterol [facility]) 200-5 MCG/ACT AERO Inhale 2 puffs into the lungs 2 (two) times a day. 05/21/19   Collene Gobble, MD  omeprazole (PRILOSEC) 40 MG capsule TAKE 1 CAPSULE(40 MG) BY MOUTH DAILY 01/10/19   Biagio Borg, MD  ondansetron (ZOFRAN) 4 MG tablet Take 1 tablet (4 mg total) by mouth every 6 (six) hours. 07/31/19   Biagio Borg, MD  oxyCODONE-acetaminophen (PERCOCET) 10-325 MG tablet Take 1 tablet by mouth every 8 (eight) hours as needed for pain.  08/18/18   [provider]  potassium chloride (K-DUR) 10 MEQ tablet 3 tab by mouth per day only when taking the lasix 07/18/18   Biagio Borg, MD  predniSONE (DELTASONE) 10 MG tablet 3 tabs by mouth per day for 3 days,2tabs per day for 3 days,1tab per day for 3 days 09/07/19   Biagio Borg, MD  rizatriptan (MAXALT-MLT) 10 MG disintegrating tablet Take 1 tablet earliest onset of migraine.  May repeat in 2 hours if needed.  Maximum 2 tablets in 24 hours 06/26/19   Metta Clines R, DO  rosuvastatin (CRESTOR) 40 MG tablet TAKE 1 TABLET(40 MG) BY MOUTH DAILY 07/20/19   Biagio Borg, MD  sertraline (ZOLOFT) 100 MG tablet TAKE 2 TABLETS(200 MG) BY MOUTH DAILY 07/20/19   Biagio Borg, MD  telmisartan (MICARDIS) 40 MG tablet TAKE 1 TABLET(40 MG) BY MOUTH DAILY 05/11/17   Biagio Borg, MD  TURMERIC PO Take 1 tablet by mouth daily.    [provider]  vitamin B-12 (CYANOCOBALAMIN) 1000 MCG tablet Take 1,000 mcg by mouth daily.    [provider]  Vitamin D, Ergocalciferol, (DRISDOL) 50000 units CAPS capsule TAKE 1 CAPSULE BY MOUTH EVERY 7 DAYS Patient taking differently: Take 50,000 Units by mouth every 7 (seven) days.  02/16/17   Lyndal Pulley, DO  zolpidem (AMBIEN) 10 MG tablet Take 1 tablet (10 mg total) by mouth at bedtime as needed for sleep. 07/27/19   Biagio Borg, MD  zonisamide (ZONEGRAN) 100 MG capsule Take 2  capsules (200 mg total) by mouth daily. 06/26/19   Pieter Partridge, DO    Family History Family History  Problem Relation Age  of Onset   Allergies Mother    Heart attack Mother    Diabetes Mother    Diabetes Father    Heart disease Father    Rheum arthritis Father    Stroke Father    Hypertension Other    Ovarian cancer Maternal Aunt    Lung cancer Maternal Aunt    Breast cancer Maternal Aunt    Bone cancer Maternal Aunt    Stroke Maternal Uncle    Other Neg Hx     Social History Social History   Tobacco Use   Smoking status: Former Smoker    Packs/day: 1.00    Years: 20.00    Pack years: 20.00    Types: Cigarettes    Quit date: 10/25/2012    Years since quitting: 6.9   Smokeless tobacco: Never Used   Tobacco comment: QUIT 04/2010 AND STARTED BACK 2014 X 3 MONTHS. less than 1 ppd.  started at age 60.    Substance Use Topics   Alcohol use: No   Drug use: No     Allergies   Azithromycin, Bee venom, Flagyl [metronidazole], Penicillins, Shellfish allergy, and Klonopin [clonazepam]   Review of Systems Review of Systems   Physical Exam Triage Vital Signs ED Triage Vitals  Enc Vitals Group     BP 10/10/19 1439 137/73     Pulse Rate 10/10/19 1439 99     Resp 10/10/19 1439 20     Temp 10/10/19 1439 98.3 F (36.8 C)     Temp Source 10/10/19 1439 Oral     SpO2 10/10/19 1439 96 %     Weight --      Height --      Head Circumference --      Peak Flow --      Pain Score 10/10/19 1435 9     Pain Loc --      Pain Edu? --      Excl. in [place]? --    No data found.  Updated Vital Signs BP 137/73 (BP Location: Left Arm)    Pulse 99    Temp 98.3 F (36.8 C) (Oral)    Resp 20    LMP 07/17/2012    SpO2 96%   Visual Acuity Right Eye Distance:   Left Eye Distance:   Bilateral Distance:    Right Eye Near:   Left Eye Near:    Bilateral Near:     Physical Exam Vitals and nursing note reviewed.  Constitutional:      General: She is not in acute  distress.    Appearance: Normal appearance. She is not ill-appearing, toxic-appearing or diaphoretic.  HENT:     Head: Normocephalic.     Nose: Nose normal.     Mouth/Throat:     Pharynx: Oropharynx is clear.  Eyes:     Conjunctiva/sclera: Conjunctivae normal.  Pulmonary:     Effort: Pulmonary effort is normal.  Musculoskeletal:        General: Normal range of motion.     Cervical back: Normal range of motion.     Right foot: Tenderness and bony tenderness present. Normal pulse.       Legs:     Comments: 2+ pedal pulse.  Normal temperature and color.  When asked to wiggle toes patient reports she cannot wiggle toes.  Hard time dorsiflexing.  Skin:    General: Skin is warm and dry.     Findings: No rash.  Neurological:     Mental Status:  She is alert.  Psychiatric:        Mood and Affect: Mood normal.      UC Treatments / Results  Labs (all labs ordered are listed, but only abnormal results are displayed) Labs Reviewed - No data to display  EKG   Radiology DG Foot Complete Right  Result Date: 10/10/2019 CLINICAL DATA:  Fall from porch 1 day prior, pain along the medial arch in anterior foot. EXAM: RIGHT FOOT COMPLETE - 3+ VIEW COMPARISON:  Ankle radiographs 07/21/2016, ankle CT 07/21/2016 FINDINGS: There is diffuse swelling of the midfoot without clear fracture. No gross widening of the Lisfranc joint however evaluation may be limited by nonweightbearing technique. There is a slight pes planus deformity, also incompletely assessed on nonweightbearing radiographs. Plantar calcaneal spur is seen. Alignment at the ankle is grossly preserved. IMPRESSION: 1. No acute fracture or dislocation. 2. Diffuse soft tissue swelling of the midfoot. Please note mid and hindfoot alignment is incompletely assessed on nonweightbearing radiograph. If there is persisting concern for a Lisfranc or Charcot joint injury dedicated radiograph should be performed. 3. Apparent pes planus deformity. 4.  Plantar calcaneal spur. Electronically Signed   By: Lovena Le M.D.   On: 10/10/2019 15:29    Procedures Procedures (including critical care time)  Medications Ordered in UC Medications - No data to display  Initial Impression / Assessment and Plan / UC Course  I have reviewed the triage vital signs and the nursing notes.  Pertinent labs & imaging results that were available during my care of the patient were reviewed by me and considered in my medical decision making (see chart for details).     Right foot injury-no acute fracture on x-ray Questionable Lisfranc injury Place patient in cam walker and have her follow-up with orthopedics. Rest, ice, elevate and ibuprofen for pain  Final Clinical Impressions(s) / UC Diagnoses   Final diagnoses:  Injury of right foot, initial encounter     Discharge Instructions     No fractures of the foot but there is some concern for ligament injury.  We will place you in a walking boot and have you continue to rest, ice and elevate.  Call ortho tomorrow for follow up.     ED Prescriptions    None     PDMP not reviewed this encounter.   Orvan July, NP 10/11/19 [phone]

## 2019-10-11 ENCOUNTER — Encounter: Payer: BC Managed Care – PPO | Admitting: Neurology

## 2019-10-17 ENCOUNTER — Encounter: Payer: Self-pay | Admitting: Internal Medicine

## 2019-10-18 MED ORDER — GABAPENTIN 300 MG PO CAPS: ORAL_CAPSULE | ORAL | 5 refills | Status: DC

## 2019-10-18 MED ORDER — CETIRIZINE HCL 10 MG PO TABS: ORAL_TABLET | ORAL | 1 refills | Status: DC

## 2019-11-05 ENCOUNTER — Ambulatory Visit: Payer: BC Managed Care – PPO | Admitting: Endocrinology

## 2019-11-05 DIAGNOSIS — M47817 Spondylosis without myelopathy or radiculopathy, lumbosacral region: Secondary | ICD-10-CM | POA: Diagnosis not present

## 2019-11-05 DIAGNOSIS — G894 Chronic pain syndrome: Secondary | ICD-10-CM | POA: Diagnosis not present

## 2019-11-05 DIAGNOSIS — M069 Rheumatoid arthritis, unspecified: Secondary | ICD-10-CM | POA: Diagnosis not present

## 2019-11-05 DIAGNOSIS — D869 Sarcoidosis, unspecified: Secondary | ICD-10-CM | POA: Diagnosis not present

## 2019-11-07 ENCOUNTER — Encounter: Payer: Self-pay | Admitting: Emergency Medicine

## 2019-11-07 ENCOUNTER — Ambulatory Visit (INDEPENDENT_AMBULATORY_CARE_PROVIDER_SITE_OTHER): Payer: BC Managed Care – PPO | Admitting: Emergency Medicine

## 2019-11-07 ENCOUNTER — Other Ambulatory Visit: Payer: Self-pay

## 2019-11-07 DIAGNOSIS — R059 Cough, unspecified: Secondary | ICD-10-CM

## 2019-11-07 DIAGNOSIS — D860 Sarcoidosis of lung: Secondary | ICD-10-CM

## 2019-11-07 DIAGNOSIS — B370 Candidal stomatitis: Secondary | ICD-10-CM

## 2019-11-07 DIAGNOSIS — R05 Cough: Secondary | ICD-10-CM

## 2019-11-07 DIAGNOSIS — J301 Allergic rhinitis due to pollen: Secondary | ICD-10-CM

## 2019-11-07 DIAGNOSIS — J449 Chronic obstructive pulmonary disease, unspecified: Secondary | ICD-10-CM

## 2019-11-07 DIAGNOSIS — D869 Sarcoidosis, unspecified: Secondary | ICD-10-CM | POA: Diagnosis not present

## 2019-11-07 DIAGNOSIS — K219 Gastro-esophageal reflux disease without esophagitis: Secondary | ICD-10-CM | POA: Diagnosis not present

## 2019-11-07 MED ORDER — FLUCONAZOLE 100 MG PO TABS: 100.0000 mg | ORAL_TABLET | Freq: Every day | ORAL | 0 refills | Status: AC

## 2019-11-07 NOTE — Assessment & Plan Note (Signed)
Please continue fluticasone nasal spray, 2 sprays each nostril once daily. Please continue Zyrtec 10 mg once daily. Please continue Astelin nasal spray, 2 sprays each nostril each evening

## 2019-11-07 NOTE — Assessment & Plan Note (Signed)
Continue Dulera 2 puffs twice daily.  Rinse and gargle after using. Keep your Xopenex available to use 2 puffs if you needed for shortness of breath, chest tightness, wheezing.

## 2019-11-07 NOTE — Progress Notes (Signed)
Subjective:    Patient ID: Nicole Melendez, female    DOB: 24-Jul-1976, 44 y.o.   MRN: [id number]  HPI   ROV 11/07/19 --[name] is 2, has history of former tobacco and sarcoidosis with obstructive lung disease.  Also with diabetes, GERD, hypertension.  She has been managed on methotrexate but came off after she lost connection with Dr Nicole Melendez.  Formerly on Symbicort currently on [place], uses Xopenex approximately 3-4x a week. She remains on fluticasone nasal spray, Zyrtec, Astelin nasal spray qhs for chronic rhinitis.   She believes that her breathing is doing fairly well. No real wheeze. She is able to do her daily activities and work.  She is having a lot of throat clearing and daily dry cough. Using tessalon perles and robitussin prn .  She remains on omeprazole without any significant breakthrough GERD.   Underwent pulmonary function testing 05/04/2019 which I reviewed, show normal spirometry without a bronchodilator response, very mild restriction based on slightly decreased TLC, decreased diffusion capacity that corrects to the normal range when adjusted for her alveolar volume.  The FEV1 is stable compared with 4 years ago. Most recent CT chest from 01/04/2019 reviewed, shows persistent patchy groundglass attenuation in a peribronchovascular distribution especially in the mid to upper lung zones, some interstitial thickening both bases with subpleural micro nodularity.  No honeycombing or areas of consolidation.  Very little change compared with 10/06/2015.   Review of Systems  As per HPI     Objective:   Physical Exam  Vitals:   11/07/19 0919  BP: (!) 142/92  Pulse: 72  Temp: (!) 97 F (36.1 C)  TempSrc: Temporal  SpO2: 98%  Weight: (!) 337 lb (152.9 kg)  Height: 5\' 7"  (1.702 m)   Gen: Pleasant, overweight, in no distress,  normal affect  ENT: No lesions,  mouth clear,  oropharynx clear, no postnasal drip  Neck: No JVD, no stridor  Lungs: No use of accessory muscles,  clear without rales or rhonchi, no wheeze on forced expiration  Cardiovascular: RRR, heart sounds normal, no murmur or gallops, no peripheral edema  Musculoskeletal: No deformities, no cyanosis or clubbing  Neuro: alert, non focal  Skin: Warm, no lesions or rashes    10/06/15 --  COMPARISON: 05/13/2010.  FINDINGS: Mediastinum/Lymph Nodes: Mediastinal lymph nodes are not enlarged by CT size criteria. Hilar regions are difficult to definitively evaluate without IV contrast. No axillary adenopathy. Heart size normal. No pericardial effusion.  Lungs/Pleura: Image quality is somewhat degraded by body habitus. Mild patchy upper and midlung zone predominant ground-glass with slight architectural distortion. A corresponding acute infectious/ inflammatory process was seen in the upper/ mid lung zones on 05/13/2010. No pleural fluid. Airway is unremarkable.  Upper abdomen: Visualized portions of the liver, left adrenal gland, left kidney, spleen and pancreas are grossly unremarkable. Postoperative changes in the stomach.  Musculoskeletal: No worrisome lytic or sclerotic lesions.  IMPRESSION: 1. Upper/midlung zone predominant ground-glass and mild architectural distortion may represent the residua of acute changes of sarcoid seen on 05/13/2010. Post infectious scarring can also have this appearance. 2. Mediastinal lymph nodes are within normal limits.      Assessment & Plan:  PULMONARY SARCOIDOSIS We will continue to follow you off of methotrexate for now.  Depending on any changes in your breathing, scans or pulmonary function testing we may decide to restart at some point in the future. Follow with Dr Nicole Melendez in 4 months or sooner if you have any problems.  Sarcoidosis  of lung [facility]) We will continue to follow you off of methotrexate for now.  Depending on any changes in your breathing, scans or pulmonary function testing we may decide to restart at some point in the future.  Continue Dulera 2 puffs twice daily.  Rinse and gargle after using. Keep your Xopenex available to use 2 puffs if you needed for shortness of breath, chest tightness, wheezing.  COPD (chronic obstructive pulmonary disease) (HCC) Continue Dulera 2 puffs twice daily.  Rinse and gargle after using. Keep your Xopenex available to use 2 puffs if you needed for shortness of breath, chest tightness, wheezing.  Gastroesophageal reflux disease Increase your omeprazole to 40 mg twice a day for the next 2 weeks.  Then decrease back to 40 mg once a day.   Cough More aggressively treat GERD, continue regimen for allergic rhinitis  Please continue fluticasone nasal spray, 2 sprays each nostril once daily. Please continue Zyrtec 10 mg once daily. Please continue Astelin nasal spray, 2 sprays each nostril each evening Continue your Robitussin and Tessalon Perles as needed for cough suppression Try your best to avoid throat clearing Increase your omeprazole to 40 mg twice a day for the next 2 weeks.  Then decrease back to 40 mg once a day.  Oral thrush Take fluconazole 100 mg daily for the next 3 days and then stop.   Allergic rhinitis Please continue fluticasone nasal spray, 2 sprays each nostril once daily. Please continue Zyrtec 10 mg once daily. Please continue Astelin nasal spray, 2 sprays each nostril each evening   Baltazar Apo, MD, PhD 11/07/2019, 5:38 PM [facility] [phone] or if no answer [phone]

## 2019-11-07 NOTE — Assessment & Plan Note (Signed)
We will continue to follow you off of methotrexate for now.  Depending on any changes in your breathing, scans or pulmonary function testing we may decide to restart at some point in the future. Continue Dulera 2 puffs twice daily.  Rinse and gargle after using. Keep your Xopenex available to use 2 puffs if you needed for shortness of breath, chest tightness, wheezing.

## 2019-11-07 NOTE — Assessment & Plan Note (Signed)
Take fluconazole 100 mg daily for the next 3 days and then stop.

## 2019-11-07 NOTE — Assessment & Plan Note (Signed)
We will continue to follow you off of methotrexate for now.  Depending on any changes in your breathing, scans or pulmonary function testing we may decide to restart at some point in the future. Follow with Dr Lamonte Sakai in 4 months or sooner if you have any problems.

## 2019-11-07 NOTE — Assessment & Plan Note (Addendum)
More aggressively treat GERD, continue regimen for allergic rhinitis  Please continue fluticasone nasal spray, 2 sprays each nostril once daily. Please continue Zyrtec 10 mg once daily. Please continue Astelin nasal spray, 2 sprays each nostril each evening Continue your Robitussin and Tessalon Perles as needed for cough suppression Try your best to avoid throat clearing Increase your omeprazole to 40 mg twice a day for the next 2 weeks.  Then decrease back to 40 mg once a day.

## 2019-11-07 NOTE — Patient Instructions (Addendum)
We will continue to follow you off of methotrexate for now.  Depending on any changes in your breathing, scans or pulmonary function testing we may decide to restart at some point in the future. Continue Dulera 2 puffs twice daily.  Rinse and gargle after using. Keep your Xopenex available to use 2 puffs if you needed for shortness of breath, chest tightness, wheezing. Please continue fluticasone nasal spray, 2 sprays each nostril once daily. Please continue Zyrtec 10 mg once daily. Please continue Astelin nasal spray, 2 sprays each nostril each evening Continue your Robitussin and Tessalon Perles as needed for cough suppression Try your best to avoid throat clearing Increase your omeprazole to 40 mg twice a day for the next 2 weeks.  Then decrease back to 40 mg once a day. Take fluconazole 100 mg daily for the next 3 days and then stop. Follow with Nicole Melendez in 4 months or sooner if you have any problems.

## 2019-11-07 NOTE — Assessment & Plan Note (Signed)
Increase your omeprazole to 40 mg twice a day for the next 2 weeks.  Then decrease back to 40 mg once a day.

## 2019-11-08 ENCOUNTER — Other Ambulatory Visit: Payer: Self-pay

## 2019-11-08 ENCOUNTER — Encounter (INDEPENDENT_AMBULATORY_CARE_PROVIDER_SITE_OTHER): Payer: Self-pay

## 2019-11-08 ENCOUNTER — Telehealth: Payer: Self-pay | Admitting: Neurology

## 2019-11-08 ENCOUNTER — Telehealth: Payer: Self-pay | Admitting: *Deleted

## 2019-11-08 MED ORDER — GABAPENTIN 300 MG PO CAPS: ORAL_CAPSULE | ORAL | 5 refills | Status: DC

## 2019-11-08 MED ORDER — RIZATRIPTAN BENZOATE 10 MG PO TBDP: ORAL_TABLET | ORAL | 3 refills | Status: DC

## 2019-11-08 NOTE — Telephone Encounter (Signed)
Patient didn't want the prednsione and could not get here till late, she asked to refill the maxalt and I gave her 9 tablets as a refill. Just fyi

## 2019-11-08 NOTE — Telephone Encounter (Signed)
Gabapentin 300 mg 2 caps po tid is covered # 90 per 23 days OR # 270 per 68 days. Will need a new rx for one of the two options sent to [facility]

## 2019-11-08 NOTE — Telephone Encounter (Signed)
If she can get here before 4 PM, she may come to the office for a headache cocktail (would need a driver).  Otherwise, we can prescribe a prednisone taper.

## 2019-11-08 NOTE — Telephone Encounter (Signed)
Can you recommend anything

## 2019-11-08 NOTE — Telephone Encounter (Signed)
Patient called in regarding her having Cluster Migraines now for 3 days. She said she cannot work or do anything. She uses [facility] on [address]. Please Call. Thank you

## 2019-11-09 ENCOUNTER — Ambulatory Visit: Payer: BC Managed Care – PPO

## 2019-11-09 ENCOUNTER — Other Ambulatory Visit: Payer: Self-pay

## 2019-11-09 ENCOUNTER — Other Ambulatory Visit (INDEPENDENT_AMBULATORY_CARE_PROVIDER_SITE_OTHER): Payer: BC Managed Care – PPO

## 2019-11-09 ENCOUNTER — Telehealth: Payer: Self-pay | Admitting: Neurology

## 2019-11-09 ENCOUNTER — Encounter: Payer: Self-pay | Admitting: Internal Medicine

## 2019-11-09 DIAGNOSIS — G43909 Migraine, unspecified, not intractable, without status migrainosus: Secondary | ICD-10-CM

## 2019-11-09 DIAGNOSIS — G43009 Migraine without aura, not intractable, without status migrainosus: Secondary | ICD-10-CM

## 2019-11-09 MED ORDER — METOCLOPRAMIDE HCL 5 MG/ML IJ SOLN: 10.0000 mg | Freq: Once | INTRAVENOUS | Status: DC

## 2019-11-09 MED ORDER — KETOROLAC TROMETHAMINE 60 MG/2ML IM SOLN: 60.0000 mg | Freq: Once | INTRAMUSCULAR | Status: DC

## 2019-11-09 MED ORDER — METOCLOPRAMIDE HCL 5 MG/ML IJ SOLN
10.0000 mg | Freq: Once | INTRAVENOUS | Status: AC
  Administered 2019-11-09: 10 mg via INTRAMUSCULAR

## 2019-11-09 MED ORDER — KETOROLAC TROMETHAMINE 60 MG/2ML IM SOLN: 60.0000 mg | Freq: Once | INTRAMUSCULAR | Status: AC

## 2019-11-09 MED ORDER — DIPHENHYDRAMINE HCL 50 MG/ML IJ SOLN: 25.0000 mg | Freq: Once | INTRAMUSCULAR | Status: DC

## 2019-11-09 MED ORDER — DIPHENHYDRAMINE HCL 50 MG/ML IJ SOLN
25.0000 mg | Freq: Once | INTRAMUSCULAR | Status: AC
  Administered 2019-11-09: 25 mg via INTRAMUSCULAR

## 2019-11-09 NOTE — Telephone Encounter (Signed)
Please advise 

## 2019-11-09 NOTE — Telephone Encounter (Signed)
Yes, that is fine. 

## 2019-11-09 NOTE — Telephone Encounter (Signed)
Patient called needing to see if Dr. Tomi Melendez could give her a headache cocktail? She said she has continued to have a headache through the night. She will have a driver to bring her. Please Call. Thank you

## 2019-11-12 ENCOUNTER — Encounter: Payer: Self-pay | Admitting: Internal Medicine

## 2019-11-20 ENCOUNTER — Ambulatory Visit: Payer: Self-pay | Admitting: Internal Medicine

## 2019-11-20 ENCOUNTER — Encounter (HOSPITAL_COMMUNITY): Payer: Self-pay | Admitting: *Deleted

## 2019-11-20 ENCOUNTER — Emergency Department (HOSPITAL_COMMUNITY)
Admission: EM | Admit: 2019-11-20 | Discharge: 2019-11-20 | Disposition: A | Discharge status: Home / Self Care | Payer: BC Managed Care – PPO | Attending: Emergency Medicine | Admitting: Emergency Medicine

## 2019-11-20 ENCOUNTER — Other Ambulatory Visit: Payer: Self-pay

## 2019-11-20 ENCOUNTER — Emergency Department (HOSPITAL_COMMUNITY): Payer: BC Managed Care – PPO

## 2019-11-20 ENCOUNTER — Telehealth: Payer: Self-pay | Admitting: Internal Medicine

## 2019-11-20 DIAGNOSIS — Z79899 Other long term (current) drug therapy: Secondary | ICD-10-CM | POA: Diagnosis not present

## 2019-11-20 DIAGNOSIS — Z87891 Personal history of nicotine dependence: Secondary | ICD-10-CM | POA: Diagnosis not present

## 2019-11-20 DIAGNOSIS — J449 Chronic obstructive pulmonary disease, unspecified: Secondary | ICD-10-CM | POA: Diagnosis not present

## 2019-11-20 DIAGNOSIS — Z794 Long term (current) use of insulin: Secondary | ICD-10-CM | POA: Insufficient documentation

## 2019-11-20 DIAGNOSIS — R079 Chest pain, unspecified: Secondary | ICD-10-CM

## 2019-11-20 DIAGNOSIS — E1140 Type 2 diabetes mellitus with diabetic neuropathy, unspecified: Secondary | ICD-10-CM | POA: Diagnosis not present

## 2019-11-20 DIAGNOSIS — I5032 Chronic diastolic (congestive) heart failure: Secondary | ICD-10-CM | POA: Insufficient documentation

## 2019-11-20 DIAGNOSIS — I110 Hypertensive heart disease with heart failure: Secondary | ICD-10-CM | POA: Diagnosis not present

## 2019-11-20 LAB — CBC
HCT: 39.8 % (ref 36.0–46.0)
Hemoglobin: 12.9 g/dL (ref 12.0–15.0)
MCH: 28.4 pg (ref 26.0–34.0)
MCHC: 32.4 g/dL (ref 30.0–36.0)
MCV: 87.5 fL (ref 80.0–100.0)
Platelets: 326 10*3/uL (ref 150–400)
RBC: 4.55 MIL/uL (ref 3.87–5.11)
RDW: 13.5 % (ref 11.5–15.5)
WBC: 9.6 10*3/uL (ref 4.0–10.5)
nRBC: 0 % (ref 0.0–0.2)

## 2019-11-20 LAB — BASIC METABOLIC PANEL
Anion gap: 8 (ref 5–15)
BUN: 10 mg/dL (ref 6–20)
CO2: 24 mmol/L (ref 22–32)
Calcium: 9.3 mg/dL (ref 8.9–10.3)
Chloride: 107 mmol/L (ref 98–111)
Creatinine, Ser: 0.67 mg/dL (ref 0.44–1.00)
GFR calc Af Amer: >60 mL/min (ref >60)
GFR calc non Af Amer: >60 mL/min (ref >60)
Glucose, Bld: 144 mg/dL — ABNORMAL HIGH (ref 70–99)
Potassium: 4 mmol/L (ref 3.5–5.1)
Sodium: 139 mmol/L (ref 135–145)

## 2019-11-20 LAB — I-STAT BETA HCG BLOOD, ED (MC, WL, AP ONLY): I-stat hCG, quantitative: <5 m[IU]/mL (ref <5)

## 2019-11-20 LAB — TROPONIN I (HIGH SENSITIVITY)
Troponin I (High Sensitivity): <2 ng/L (ref <18)
Troponin I (High Sensitivity): <2 ng/L (ref <18)

## 2019-11-20 MED ORDER — ASPIRIN 325 MG PO TABS
325.0000 mg | ORAL_TABLET | Freq: Every day | ORAL | Status: DC
  Administered 2019-11-20: 325 mg via ORAL
  Filled 2019-11-20: qty 1

## 2019-11-20 MED ORDER — SODIUM CHLORIDE 0.9% FLUSH
3.0000 mL | Freq: Once | INTRAVENOUS | Status: AC
  Administered 2019-11-20: 3 mL via INTRAVENOUS

## 2019-11-20 NOTE — Discharge Instructions (Addendum)
As discussed, all of your labs, chest x-ray, and EKG looked good today. You are not having a heart attack. I have included the number of the cardiologist. Call tomorrow to schedule an appointment for further evaluation given your increased risk factors. You may take over the counter ibuprofen or tylenol as needed for pain. Return to the ER for new or worsening symptoms.

## 2019-11-20 NOTE — Telephone Encounter (Addendum)
     Patient calling to report chest pain and left arm numbness that started today. No other symptoms Call transferred to [facility] nurse  Please advise

## 2019-11-20 NOTE — ED Provider Notes (Signed)
[facility] DEPT Provider Note   CSN: [id number] Arrival date & time: 11/20/19  1545     History Chief Complaint  Patient presents with  . Chest Pain    Nicole Melendez is a 44 y.o. female with a past medical history significant for anemia, anxiety, depression, diabetes, hypertension, hyperlipidemia, morbid obesity, sarcoidosis, and peptic ulcer disease who presents to the ED due to central chest pain that started around 830 this morning. Patient describes pain as an achy sensation that radiates down her left arm associated with left arm numbness.  Patient states initial episode lasted 3 minutes with intense pain and then dulled down. She notes she is still experiencing mild chest pain right now.  Chest pain also associated with shortness of breath. No association with exertion. Patient tried her albuterol inhaler with no relief. Patient denies associative nausea, vomiting, and diaphoresis. She denies tobacco, alcohol, and drug use. Patient has a positive history for early CAD (both her parents had MI before the age of 37). Patient denies history of blood clots, recent surgeries, recent immobilizations, and hormonal treatments.  Patient denies treatment prior to arrival.  Patient denies recent illness.  Patient denies abdominal pain. Denies URI symptoms.    Past Medical History:  Diagnosis Date  . Allergic rhinitis, cause unspecified   . Anemia   . Anxiety   . Depression   . Diabetes mellitus type II    steroid related, patient states "im no longer diabetic"  . Essential hypertension 08/08/2015  . GERD (gastroesophageal reflux disease)   . History of blood transfusion   . Hyperlipidemia   . Menorrhagia   . Migraine   . Morbid obesity ([place])   . Otitis media 06/28/2015  . Peptic ulcer disease   . Positive ANA (antinuclear antibody) 02/14/2012  . Sarcoid    including hand per rheumatology-Dr. Amil Amen  . Sarcoidosis of lung ([place])   . Shortness of breath     on exertion  . Varicose veins with pain     Patient Active Problem List   Diagnosis Date Noted  . COPD (chronic obstructive pulmonary disease) ([place]) 11/07/2019  . Suspected COVID-19 virus infection 08/02/2019  . Hematochezia 04/05/2019  . Snoring 11/17/2018  . Eye symptom 08/03/2018  . Right lumbar radiculopathy 05/08/2018  . Chronic pain 10/11/2017  . Sarcoidosis of lung ([place])   . Acute on chronic diastolic CHF (congestive heart failure) ([place]) 09/22/2017  . Rheumatoid arthritis ([place]) 09/18/2017  . Chronic diastolic CHF (congestive heart failure) ([place]) 09/18/2017  . Pancreatitis 06/26/2017  . Right ankle sprain 06/26/2017  . Oral thrush 06/22/2017  . Possible exposure to STD 06/15/2017  . Diarrhea 04/10/2017  . Bilateral hand swelling 04/07/2017  . Abdominal pain 04/07/2017  . Abnormality of gait 01/26/2017  . Avulsion fracture of right ankle 12/01/2016  . Ulcer of esophagus with bleeding   . Gastritis and gastroduodenitis   . Blepharitis of right upper eyelid 10/09/2016  . Peroneal tendinitis of lower leg, right 10/06/2016  . Right ankle pain 09/16/2016  . Dizziness 03/11/2016  . Mixed headache 03/09/2016  . Chronic venous insufficiency 02/24/2016  . Gastroenteritis 12/30/2015  . Headache 12/30/2015  . Epistaxis 12/09/2015  . 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 Cervical muscle strain 12/03/2015  . Right-sided face pain 11/26/2015  . Multiple contusions 11/26/2015  . Neck pain, bilateral posterior 11/26/2015  . Low back pain 11/26/2015  . Spinal stenosis of lumbar region 10/28/2015  . Chronic sciatica of left side 10/28/2015  .  DOE (dyspnea on exertion) 09/02/2015  . Essential hypertension 08/08/2015  . Diaphoresis 08/07/2015  . Cough 07/02/2015  . Vaginitis and vulvovaginitis 06/29/2015  . Otitis media 06/28/2015  . Thrush 06/24/2015  . Nausea & vomiting 06/10/2015  . Wheezing 11/14/2014  . Angioedema 03/19/2014  . Vaginitis 11/19/2012  . Smoker  11/19/2012  . Positive ANA (antinuclear antibody) 02/14/2012  . Polyarthritis 02/11/2012  . Migraine 04/05/2011  . Encounter for well adult exam with abnormal findings 02/21/2011  . Allergic rhinitis 02/17/2011  . URTICARIA 09/29/2010  . Depression 09/14/2010  . Diabetes mellitus with neuropathy ([place]) 07/31/2010  . PERIPHERAL EDEMA 07/31/2010  . INSOMNIA-SLEEP DISORDER-UNSPEC 06/19/2010  . 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 MENORRHAGIA 05/15/2010  . Hyperlipidemia 12/17/2009  . Iron deficiency anemia 12/17/2009  . Anxiety state 12/17/2009  . Morbid obesity ([place]) 07/02/2007  . Gastroesophageal reflux disease 07/02/2007  . PEPTIC ULCER DISEASE 07/02/2007    Past Surgical History:  Procedure Laterality Date  . ABDOMINAL HYSTERECTOMY    . COLONOSCOPY WITH PROPOFOL N/A 10/19/2016   Procedure: COLONOSCOPY WITH PROPOFOL;  Surgeon: Mauri Pole, MD;  Location: [facility] ENDOSCOPY;  Service: Endoscopy;  Laterality: N/A;  . ESOPHAGOGASTRODUODENOSCOPY (EGD) WITH PROPOFOL N/A 10/19/2016   Procedure: ESOPHAGOGASTRODUODENOSCOPY (EGD) WITH PROPOFOL;  Surgeon: Mauri Pole, MD;  Location: [facility] ENDOSCOPY;  Service: Endoscopy;  Laterality: N/A;  . HERNIA MESH REMOVAL  02/2013  . uterine ablation  03/2010  . WISDOM TOOTH EXTRACTION       OB History   No obstetric history on file.     Family History  Problem Relation Age of Onset  . Allergies Mother   . Heart attack Mother   . Diabetes Mother   . Diabetes Father   . Heart disease Father   . Rheum arthritis Father   . Stroke Father   . Hypertension Other   . Ovarian cancer Maternal Aunt   . Lung cancer Maternal Aunt   . Breast cancer Maternal Aunt   . Bone cancer Maternal Aunt   . Stroke Maternal Uncle   . Other Neg Hx     Social History   Tobacco Use  . Smoking status: Former Smoker    Packs/day: 1.00    Years: 20.00    Pack years: 20.00    Types: Cigarettes    Quit date: 10/25/2012    Years since quitting: 7.0  .  Smokeless tobacco: Never Used  . Tobacco comment: QUIT 04/2010 AND STARTED BACK 2014 X 3 MONTHS. less than 1 ppd.  started at age 69.    Substance Use Topics  . Alcohol use: No  . Drug use: No    Home Medications Prior to Admission medications   Medication Sig Start Date End Date Taking? Authorizing Provider  acetaminophen (TYLENOL) 650 MG CR tablet Take 650 mg by mouth every 8 (eight) hours as needed for pain.   Yes [provider]  albuterol (PROVENTIL HFA;VENTOLIN HFA) 108 (90 Base) MCG/ACT inhaler Inhale 2 puffs into the lungs every 4 (four) hours as needed for wheezing or shortness of breath. 11/17/18  Yes Byrum, Rose Fillers, MD  ALPRAZolam (XANAX) 0.5 MG tablet TAKE 1 TABLET(0.5 MG) BY MOUTH THREE TIMES DAILY AS NEEDED Patient taking differently: Take 0.5 mg by mouth 3 (three) times daily as needed for anxiety. TAKE 1 TABLET(0.5 MG) BY MOUTH THREE TIMES DAILY AS NEEDED 07/20/19  Yes Biagio Borg, MD  azelastine (ASTELIN) 0.1 % nasal spray Place 2 sprays into both nostrils 2 (two)  times daily. Use in each nostril as directed 03/11/16  Yes Biagio Borg, MD  cetirizine (ZYRTEC) 10 MG tablet TAKE 1 TABLET(10 MG) BY MOUTH DAILY 10/18/19  Yes Biagio Borg, MD  Diclofenac Sodium (PENNSAID) 2 % SOLN Apply 1 pump twice daily as needed. Patient taking differently: Apply 1 Pump topically 2 (two) times daily. Apply 1 pump twice daily 08/31/18  Yes Biagio Borg, MD  fluticasone [facility]) 50 MCG/ACT nasal spray Place 2 sprays into both nostrils daily. 09/23/17  Yes Eugenie Filler, MD  furosemide (LASIX) 40 MG tablet 1 tab by mouth every day in the AM and then 1 as needed only for persistent swelling in the PM Patient taking differently: Take 20 mg by mouth daily as needed for fluid.  06/16/16  Yes Biagio Borg, MD  gabapentin (NEURONTIN) 300 MG capsule TAKE 2 CAPSULES(600 MG) BY MOUTH THREE TIMES DAILY 11/08/19  Yes Biagio Borg, MD  Insulin Lispro (HUMALOG KWIKPEN) 200 UNIT/ML SOPN Inject 12  Units into the skin 4 (four) times daily. WITH MEALS W/ SLIDING SCALE ASD E11.9 07/27/19  Yes Biagio Borg, MD  levalbuterol [facility] HFA) 45 MCG/ACT inhaler Inhale 1-2 puffs into the lungs every 8 (eight) hours as needed for wheezing or shortness of breath. USE 2 PUFFS 3 TIMES DAILY X 5 DAYS THEN BACK TO HOME REGIMEN AS NEEDED. 09/22/17  Yes Eugenie Filler, MD  omeprazole (PRILOSEC) 40 MG capsule TAKE 1 CAPSULE(40 MG) BY MOUTH DAILY Patient taking differently: Take 40 mg by mouth daily.  01/10/19  Yes Biagio Borg, MD  oxyCODONE-acetaminophen (PERCOCET) 10-325 MG tablet Take 1 tablet by mouth every 8 (eight) hours as needed for pain.  08/18/18  Yes [provider]  potassium chloride (K-DUR) 10 MEQ tablet 3 tab by mouth per day only when taking the lasix Patient taking differently: Take 30 mEq by mouth daily as needed. 3 tab by mouth per day as needed when taking the lasix 07/18/18  Yes Biagio Borg, MD  rizatriptan (MAXALT-MLT) 10 MG disintegrating tablet Take 1 tablet earliest onset of migraine.  May repeat in 2 hours if needed.  Maximum 2 tablets in 24 hours Patient taking differently: Take 10 mg by mouth every 2 (two) hours as needed for migraine. Take 1 tablet earliest onset of migraine.  May repeat in 2 hours if needed.  Maximum 2 tablets in 24 hours 11/08/19  Yes Jaffe, Adam R, DO  rosuvastatin (CRESTOR) 40 MG tablet TAKE 1 TABLET(40 MG) BY MOUTH DAILY 07/20/19  Yes Biagio Borg, MD  sertraline (ZOLOFT) 100 MG tablet TAKE 2 TABLETS(200 MG) BY MOUTH DAILY 07/20/19  Yes Biagio Borg, MD  TURMERIC PO Take 1 tablet by mouth daily.   Yes [provider]  vitamin B-12 (CYANOCOBALAMIN) 1000 MCG tablet Take 1,000 mcg by mouth daily.   Yes [provider]  zonisamide (ZONEGRAN) 100 MG capsule Take 2 capsules (200 mg total) by mouth daily. 06/26/19  Yes Tomi Likens, Adam R, DO  acetaminophen (TYLENOL) 500 MG tablet Take 1 tablet (500 mg total) by mouth every 6 (six) hours as needed.  Patient not taking: Reported on 11/20/2019 11/23/18   Lauraine Rinne, NP  Blood Glucose Monitoring Suppl [facility] VERIO) w/Device KIT Use as directed daily E11.9 08/08/19   Biagio Borg, MD  fluconazole (DIFLUCAN) 150 MG tablet 1 tab by mouth every 3 days as needed 08/27/19   Biagio Borg, MD  glucose blood (ONETOUCH VERIO)  test strip Use as instructed three times daily E11.9 08/08/19   Biagio Borg, MD  Insulin Pen Needle (BD PEN NEEDLE NANO U/F) 32G X 4 MM MISC Use as directed three times daily E11.9 08/08/19   Biagio Borg, MD  levofloxacin (LEVAQUIN) 500 MG tablet Take 1 tablet (500 mg total) by mouth daily. 08/27/19   Lauraine Rinne, NP  mometasone-formoterol (DULERA) 200-5 MCG/ACT AERO Inhale 2 puffs into the lungs 2 (two) times a day. Patient not taking: Reported on 11/20/2019 05/21/19   Collene Gobble, MD  ondansetron (ZOFRAN) 4 MG tablet Take 1 tablet (4 mg total) by mouth every 6 (six) hours. Patient not taking: Reported on 11/20/2019 07/31/19   Biagio Borg, MD  telmisartan (MICARDIS) 40 MG tablet TAKE 1 TABLET(40 MG) BY MOUTH DAILY Patient not taking: Reported on 11/20/2019 05/11/17   Biagio Borg, MD  Vitamin D, Ergocalciferol, (DRISDOL) 50000 units CAPS capsule TAKE 1 CAPSULE BY MOUTH EVERY 7 DAYS Patient taking differently: Take 50,000 Units by mouth every 7 (seven) days.  02/16/17   Lyndal Pulley, DO  zolpidem (AMBIEN) 10 MG tablet Take 1 tablet (10 mg total) by mouth at bedtime as needed for sleep. Patient not taking: Reported on 11/20/2019 07/27/19   Biagio Borg, MD    Allergies    Azithromycin, Bee venom, Flagyl [metronidazole], Penicillins, Shellfish allergy, and Klonopin [clonazepam]  Review of Systems   Review of Systems  Constitutional: Negative for chills and fever.  Respiratory: Positive for shortness of breath. Negative for cough.   Cardiovascular: Positive for chest pain. Negative for leg swelling.  Gastrointestinal: Negative for abdominal pain, diarrhea, nausea  and vomiting.  Neurological: Positive for numbness. Negative for weakness and headaches.  All other systems reviewed and are negative.   Physical Exam Updated Vital Signs BP (!) 144/98   Pulse 76   Temp 98.5 F (36.9 C) (Oral)   Resp 15   Ht '5\' 7"'  (1.702 m)   Wt (!) 146.1 kg   LMP 07/17/2012   SpO2 100%   BMI 50.43 kg/m   Physical Exam Vitals and nursing note reviewed.  Constitutional:      General: She is not in acute distress.    Appearance: She is not ill-appearing.  HENT:     Head: Normocephalic.  Eyes:     Pupils: Pupils are equal, round, and reactive to light.  Cardiovascular:     Rate and Rhythm: Normal rate and regular rhythm.     Pulses: Normal pulses.     Heart sounds: Normal heart sounds. No murmur. No friction rub. No gallop.   Pulmonary:     Effort: Pulmonary effort is normal.     Breath sounds: Normal breath sounds.  Chest:     Comments: Mild anterior chest wall tenderness that does not reproduce patient's chest pain Abdominal:     General: Abdomen is flat. There is no distension.     Palpations: Abdomen is soft.     Tenderness: There is no abdominal tenderness. There is no guarding or rebound.  Musculoskeletal:     Cervical back: Neck supple.     Right lower leg: No edema.     Left lower leg: No edema.     Comments: Able to move all 4 extremities without difficulty. No lower extremity edema. Negative homans sign bilaterally.   Skin:    General: Skin is warm and dry.  Neurological:     General: No focal deficit present.  Mental Status: She is alert.  Psychiatric:        Mood and Affect: Mood normal.        Behavior: Behavior normal.     ED Results / Procedures / Treatments   Labs (all labs ordered are listed, but only abnormal results are displayed) Labs Reviewed  BASIC METABOLIC PANEL - Abnormal; Notable for the following components:      Result Value   Glucose, Bld 144 (*)    All other components within normal limits  CBC  I-STAT  BETA HCG BLOOD, ED ([facility], [facility], [facility] ONLY)  TROPONIN I (HIGH SENSITIVITY)  TROPONIN I (HIGH SENSITIVITY)    EKG None  Radiology DG Chest 2 View  Result Date: 11/20/2019 CLINICAL DATA:  Chest pain. EXAM: CHEST - 2 VIEW COMPARISON:  November 24, 2018 FINDINGS: There is no evidence of acute infiltrate, pleural effusion or pneumothorax. The heart size and mediastinal contours are within normal limits. Mild degenerative changes seen within the mid to lower thoracic spine. IMPRESSION: No active cardiopulmonary disease. Electronically Signed   By: Virgina Norfolk M.D.   On: 11/20/2019 17:06    Procedures Procedures (including critical care time)  Medications Ordered in ED Medications  aspirin tablet 325 mg (325 mg Oral Given 11/20/19 1747)  sodium chloride flush (NS) 0.9 % injection 3 mL (3 mLs Intravenous Given 11/20/19 1629)    ED Course  I have reviewed the triage vital signs and the nursing notes.  Pertinent labs & imaging results that were available during my care of the patient were reviewed by me and considered in my medical decision making (see chart for details).    MDM Rules/Calculators/A&P                     44 year old female presents to the ED due to central chest pain that started this morning around 830-9AM.  Patient states her chest pain radiates to her left arm also causing numbness and tingling of her left arm.  Patient denies history of blood clots, recent surgeries, recent immobilizations, no hormonal treatment.  Stable vitals.  Patient in no acute distress and non-ill-appearing.  Physical exam reassuring.  Mild anterior chest wall tenderness which does not reproduce patient's chest pain.  No lower extremity edema.  Negative Homans sign bilaterally. Patient has numerous risk factors for ACS. PERC negative and low risk using wells criteria, doubt PE/DVT.   Chest x-ray personally reviewed which is negative for signs of pneumonia, pneumothorax, widened mediastinum.  CBC  unremarkable with no leukocytosis.  Pregnancy test negative.  Initial troponin normal, will obtain delta troponin.  BMP significant for hyperglycemia at 144, but otherwise reassuring with normal renal function and no electrolyte abnormalities. Delta troponin normal. EKG personally reviewed which demonstrates sinus rhythm with no signs of ischemia. Given the normal delta troponin and normal EKG low likelihood of ACS. Dissection was considered, but thought to be less likely given physical exam and HPI. Will discharge patient with cardiology follow-up given her increased risk factors. Patient advised to call tomorrow to schedule an appointment. Instructed patient to take over the counter ibuprofen or tylenol as needed for pain. Strict ED precautions discussed with patient. Patient states understanding and agrees to plan. Patient discharged home in no acute distress and stable vitals. Final Clinical Impression(s) / ED Diagnoses Final diagnoses:  Nonspecific chest pain    Rx / DC Orders ED Discharge Orders    None       , Druscilla Brownie, PA-C  11/20/19 2115    Tegeler, Gwenyth Allegra, MD 11/21/19 [phone]

## 2019-11-20 NOTE — Telephone Encounter (Addendum)
Pt sitting in MD office parking and called for chest pain, radiating left arm, pt was already driving on [facility], sent to [facility].   Reason for Disposition . [1] Chest pain (or "angina") comes and goes AND [2] is happening more often (increasing in frequency) or getting worse (increasing in severity)  Answer Assessment - Initial Assessment Questions 1. LOCATION: "Where does it hurt?"       Left chest 2. RADIATION: "Does the pain go anywhere else?" (e.g., into neck, jaw, arms, back)     Left arm 3. ONSET: "When did the chest pain begin?" (Minutes, hours or days)      This morning 4. PATTERN "Does the pain come and go, or has it been constant since it started?"  "Does it get worse with exertion?"      Hangs around mostly but coming and going  5. DURATION: "How long does it last" (e.g., seconds, minutes, hours)     Lasts an 3 min off and on for hours 6. SEVERITY: "How bad is the pain?"  (e.g., Scale 1-10; mild, moderate, or severe)    - MILD (1-3): doesn't interfere with normal activities     - MODERATE (4-7): interferes with normal activities or awakens from sleep    - SEVERE (8-10): excruciating pain, unable to do any normal activities       6-8/10 when it hits for a min 7. CARDIAC RISK FACTORS: "Do you have any history of heart problems or risk factors for heart disease?" (e.g., angina, prior heart attack; diabetes, high blood pressure, high cholesterol, smoker, or strong family history of heart disease)     Family, htn, DM 8. PULMONARY RISK FACTORS: "Do you have any history of lung disease?"  (e.g., blood clots in lung, asthma, emphysema, birth control pills)     sarcoidosis 9. CAUSE: "What do you think is causing the chest pain?"     Worried that is it heart 10. OTHER SYMPTOMS: "Do you have any other symptoms?" (e.g., dizziness, nausea, vomiting, sweating, fever, difficulty breathing, cough)       No respiratory sx 11. PREGNANCY: "Is there any chance you are  pregnant?" "When was your last menstrual period?"       no  Protocols used: CHEST PAIN-A-AH

## 2019-11-20 NOTE — ED Triage Notes (Signed)
Generalized tightness this morning, used rescue inhaler about lunch time, wheezing at the time, inhaler did not help with pain.

## 2019-11-20 NOTE — ED Provider Notes (Signed)
ED ECG REPORT   Date: 11/20/2019  Rate: 80  Rhythm: normal sinus rhythm  QRS Axis: normal  Intervals: normal  ST/T Wave abnormalities: normal  Conduction Disutrbances:left anterior fascicular block  Narrative Interpretation:   Old EKG Reviewed: unchanged  I have personally reviewed the EKG tracing and agree with the computerized printout as noted.    Milena Liggett, Gwenyth Allegra, MD 11/20/19 2110

## 2019-11-21 ENCOUNTER — Encounter: Payer: Self-pay | Admitting: Internal Medicine

## 2019-11-21 NOTE — Telephone Encounter (Signed)
   Patient following up on status of override request for gabapentin (NEURONTIN) 300 MG capsule

## 2019-11-23 ENCOUNTER — Ambulatory Visit: Payer: BC Managed Care – PPO | Admitting: Endocrinology

## 2019-11-29 ENCOUNTER — Telehealth: Payer: Self-pay

## 2019-11-29 NOTE — Telephone Encounter (Signed)
Key; [id number]  Drug covered no prior authorization needed.

## 2019-12-04 DIAGNOSIS — M79606 Pain in leg, unspecified: Secondary | ICD-10-CM | POA: Diagnosis not present

## 2019-12-04 DIAGNOSIS — M069 Rheumatoid arthritis, unspecified: Secondary | ICD-10-CM | POA: Diagnosis not present

## 2019-12-04 DIAGNOSIS — M47817 Spondylosis without myelopathy or radiculopathy, lumbosacral region: Secondary | ICD-10-CM | POA: Diagnosis not present

## 2019-12-04 DIAGNOSIS — D869 Sarcoidosis, unspecified: Secondary | ICD-10-CM | POA: Diagnosis not present

## 2019-12-14 ENCOUNTER — Encounter: Payer: Self-pay | Admitting: Internal Medicine

## 2019-12-14 ENCOUNTER — Other Ambulatory Visit: Payer: Self-pay

## 2019-12-14 ENCOUNTER — Ambulatory Visit: Payer: BC Managed Care – PPO | Admitting: Internal Medicine

## 2019-12-14 VITALS — BP 140/90 | HR 87 | Temp 98.0°F | Ht 67.0 in | Wt 337.0 lb

## 2019-12-14 DIAGNOSIS — M25571 Pain in right ankle and joints of right foot: Secondary | ICD-10-CM

## 2019-12-14 DIAGNOSIS — M545 Low back pain, unspecified: Secondary | ICD-10-CM

## 2019-12-14 DIAGNOSIS — M542 Cervicalgia: Secondary | ICD-10-CM | POA: Diagnosis not present

## 2019-12-14 DIAGNOSIS — G8929 Other chronic pain: Secondary | ICD-10-CM | POA: Diagnosis not present

## 2019-12-14 MED ORDER — CYCLOBENZAPRINE HCL 5 MG PO TABS: 5.0000 mg | ORAL_TABLET | Freq: Three times a day (TID) | ORAL | 1 refills | Status: DC | PRN

## 2019-12-14 NOTE — Assessment & Plan Note (Signed)
This episode c/w msk strain - for prn muscle relaxer

## 2019-12-14 NOTE — Assessment & Plan Note (Addendum)
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 and possible right cervical radicular pain - for plain films today, cont current pain control, and f/u Dr Glenna Durand Feb 22; also work note given  I spent 40 minutes in addition to time for wellness examination in preparing to see the patient by review of recent labs, imaging and procedures, obtaining and reviewing separately obtained history, communicating with the patient and family or caregiver, ordering medications, tests or procedures, and documenting clinical information in the EHR including the differential Dx, treatment, and any further evaluation and other management of neck pain and radicular pain, right low back pain, right ankle pain

## 2019-12-14 NOTE — Patient Instructions (Addendum)
Please take all new medication as prescribed - the muscle relaxer  Please continue all other medications as before, and refills have been done if requested.  Please have the pharmacy call with any other refills you may need.  Please continue your efforts at being more active, low cholesterol diet, and weight control.  Please keep your appointments with your specialists as you may have planned - Dr Rolena Infante for Monday Feb 22  You will be contacted regarding the referral for: Sports Medicine  Please go to the XRAY Department in the first floor for the x-ray testing  You will be contacted by phone if any changes need to be made immediately.  Otherwise, you will receive a letter about your results with an explanation, but please check with MyChart first.  Please remember to sign up for MyChart if you have not done so, as this will be important to you in the future with finding out test results, communicating by private email, and scheduling acute appointments online when needed.

## 2019-12-14 NOTE — Progress Notes (Signed)
Subjective:    Patient ID: Nicole Melendez, female    DOB: [birth year], 44 y.o.   MRN: [id number]  HPI  Here with several issues, including post neck pain right > left moderate intermittent but worse to move the head horizontally, and associated with RUE numbness and maybe mild weakness at times.  Does also have mild intermitent numbness to LUE distal.  Also Pt continues to have recurring right LBP without change in severity, bowel or bladder change, fever, wt loss,  worsening LE pain/numbness/weakness, gait change or falls.  Also c/o 2 mo right ankle swelling moderate constant with pain persistent since fall off porch.  Has hx of ankle fx with peristent swelling then as well, asks to see sports medicine.  Has not been able to go to work since [employer].  No fever, trauma. Past Medical History:  Diagnosis Date  . Allergic rhinitis, cause unspecified   . Anemia   . Anxiety   . Depression   . Diabetes mellitus type II    steroid related, patient states "im no longer diabetic"  . Essential hypertension 08/08/2015  . GERD (gastroesophageal reflux disease)   . History of blood transfusion   . Hyperlipidemia   . Menorrhagia   . Migraine   . Morbid obesity ([place])   . Otitis media 06/28/2015  . Peptic ulcer disease   . Positive ANA (antinuclear antibody) 02/14/2012  . Sarcoid    including hand per rheumatology-Dr. Amil Amen  . Sarcoidosis of lung ([place])   . Shortness of breath    on exertion  . Varicose veins with pain    Past Surgical History:  Procedure Laterality Date  . ABDOMINAL HYSTERECTOMY    . COLONOSCOPY WITH PROPOFOL N/A 10/19/2016   Procedure: COLONOSCOPY WITH PROPOFOL;  Surgeon: Mauri Pole, MD;  Location: [facility];  Service: Endoscopy;  Laterality: N/A;  . ESOPHAGOGASTRODUODENOSCOPY (EGD) WITH PROPOFOL N/A 10/19/2016   Procedure: ESOPHAGOGASTRODUODENOSCOPY (EGD) WITH PROPOFOL;  Surgeon: Mauri Pole, MD;  Location: [facility];  Service: Endoscopy;  Laterality:  N/A;  . HERNIA MESH REMOVAL  02/2013  . uterine ablation  03/2010  . WISDOM TOOTH EXTRACTION      reports that she quit smoking about 7 years ago. Her smoking use included cigarettes. She has a 20.00 pack-year smoking history. She has never used smokeless tobacco. She reports that she does not drink alcohol or use drugs. family history includes Allergies in her mother; Bone cancer in her maternal aunt; Breast cancer in her maternal aunt; Diabetes in her father and mother; Heart attack in her mother; Heart disease in her father; Hypertension in an other family member; Lung cancer in her maternal aunt; Ovarian cancer in her maternal aunt; Rheum arthritis in her father; Stroke in her father and maternal uncle. Allergies  Allergen Reactions  . Azithromycin Hives    (z pak) hives  . Bee Venom Anaphylaxis  . Flagyl [Metronidazole] Hives and Shortness Of Breath  . Penicillins Shortness Of Breath and Swelling    Has patient had a PCN reaction causing immediate rash, facial/tongue/throat swelling, SOB or lightheadedness with hypotension: Yes Has patient had a PCN reaction causing severe rash involving mucus membranes or skin necrosis: No Has patient had a PCN reaction that required hospitalization No Has patient had a PCN reaction occurring within the last 10 years: No If all of the above answers are "NO", then may proceed with Cephalosporin use.  REACTION: swelling and difficulty breathing  . Shellfish Allergy Anaphylaxis  .  Klonopin [Clonazepam] Other (See Comments)    Memory difficulty   Current Outpatient Medications on File Prior to Visit  Medication Sig Dispense Refill  . acetaminophen (TYLENOL) 500 MG tablet Take 1 tablet (500 mg total) by mouth every 6 (six) hours as needed. 30 tablet 0  . acetaminophen (TYLENOL) 650 MG CR tablet Take 650 mg by mouth every 8 (eight) hours as needed for pain.    Marland Kitchen albuterol (PROVENTIL HFA;VENTOLIN HFA) 108 (90 Base) MCG/ACT inhaler Inhale 2 puffs into the  lungs every 4 (four) hours as needed for wheezing or shortness of breath. 1 Inhaler 2  . ALPRAZolam (XANAX) 0.5 MG tablet TAKE 1 TABLET(0.5 MG) BY MOUTH THREE TIMES DAILY AS NEEDED (Patient taking differently: Take 0.5 mg by mouth 3 (three) times daily as needed for anxiety. TAKE 1 TABLET(0.5 MG) BY MOUTH THREE TIMES DAILY AS NEEDED) 90 tablet 2  . azelastine (ASTELIN) 0.1 % nasal spray Place 2 sprays into both nostrils 2 (two) times daily. Use in each nostril as directed 30 mL 12  . Blood Glucose Monitoring Suppl (ONETOUCH VERIO) w/Device KIT Use as directed daily E11.9 1 kit 0  . cetirizine (ZYRTEC) 10 MG tablet TAKE 1 TABLET(10 MG) BY MOUTH DAILY 90 tablet 1  . Diclofenac Sodium (PENNSAID) 2 % SOLN Apply 1 pump twice daily as needed. (Patient taking differently: Apply 1 Pump topically 2 (two) times daily. Apply 1 pump twice daily) 112 g 3  . fluticasone (FLONASE) 50 MCG/ACT nasal spray Place 2 sprays into both nostrils daily. 16 g 0  . furosemide (LASIX) 40 MG tablet 1 tab by mouth every day in the AM and then 1 as needed only for persistent swelling in the PM (Patient taking differently: Take 20 mg by mouth daily as needed for fluid. ) 60 tablet 11  . gabapentin (NEURONTIN) 300 MG capsule TAKE 2 CAPSULES(600 MG) BY MOUTH THREE TIMES DAILY 270 capsule 5  . glucose blood (ONETOUCH VERIO) test strip Use as instructed three times daily E11.9 100 each 12  . Insulin Lispro (HUMALOG KWIKPEN) 200 UNIT/ML SOPN Inject 12 Units into the skin 4 (four) times daily. WITH MEALS W/ SLIDING SCALE ASD E11.9 5 pen 11  . Insulin Pen Needle (BD PEN NEEDLE NANO U/F) 32G X 4 MM MISC Use as directed three times daily E11.9 300 each 3  . levalbuterol (XOPENEX HFA) 45 MCG/ACT inhaler Inhale 1-2 puffs into the lungs every 8 (eight) hours as needed for wheezing or shortness of breath. USE 2 PUFFS 3 TIMES DAILY X 5 DAYS THEN BACK TO HOME REGIMEN AS NEEDED. 1 Inhaler 0  . mometasone-formoterol (DULERA) 200-5 MCG/ACT AERO Inhale  2 puffs into the lungs 2 (two) times a day. 8.8 g 5  . omeprazole (PRILOSEC) 40 MG capsule TAKE 1 CAPSULE(40 MG) BY MOUTH DAILY (Patient taking differently: Take 40 mg by mouth daily. ) 90 capsule 1  . ondansetron (ZOFRAN) 4 MG tablet Take 1 tablet (4 mg total) by mouth every 6 (six) hours. 30 tablet 0  . oxyCODONE-acetaminophen (PERCOCET) 10-325 MG tablet Take 1 tablet by mouth every 8 (eight) hours as needed for pain.   0  . potassium chloride (K-DUR) 10 MEQ tablet 3 tab by mouth per day only when taking the lasix (Patient taking differently: Take 30 mEq by mouth daily as needed. 3 tab by mouth per day as needed when taking the lasix) 90 tablet 11  . rizatriptan (MAXALT-MLT) 10 MG disintegrating tablet Take 1 tablet earliest onset  of migraine.  May repeat in 2 hours if needed.  Maximum 2 tablets in 24 hours (Patient taking differently: Take 10 mg by mouth every 2 (two) hours as needed for migraine. Take 1 tablet earliest onset of migraine.  May repeat in 2 hours if needed.  Maximum 2 tablets in 24 hours) 9 tablet 3  . rosuvastatin (CRESTOR) 40 MG tablet TAKE 1 TABLET(40 MG) BY MOUTH DAILY 90 tablet 1  . sertraline (ZOLOFT) 100 MG tablet TAKE 2 TABLETS(200 MG) BY MOUTH DAILY 180 tablet 1  . telmisartan (MICARDIS) 40 MG tablet TAKE 1 TABLET(40 MG) BY MOUTH DAILY 90 tablet 0  . TURMERIC PO Take 1 tablet by mouth daily.    . vitamin B-12 (CYANOCOBALAMIN) 1000 MCG tablet Take 1,000 mcg by mouth daily.    Marland Kitchen zolpidem (AMBIEN) 10 MG tablet Take 1 tablet (10 mg total) by mouth at bedtime as needed for sleep. 90 tablet 1  . zonisamide (ZONEGRAN) 100 MG capsule Take 2 capsules (200 mg total) by mouth daily. 60 capsule 3   No current facility-administered medications on file prior to visit.   Review of Systems All otherwise neg per pt     Objective:   Physical Exam BP 140/90   Pulse 87   Temp 98 F (36.7 C) (Oral)   Ht '5\' 7"'  (1.702 m)   Wt (!) 337 lb (152.9 kg)   LMP 07/17/2012   SpO2 97%   BMI  52.78 kg/m  VS noted,  Constitutional: Pt appears in NAD HENT: Head: NCAT.  Right Ear: External ear normal.  Left Ear: External ear normal.  Eyes: . Pupils are equal, round, and reactive to light. Conjunctivae and EOM are normal Nose: without d/c or deformity Neck: Neck supple. Gross normal ROM Cardiovascular: Normal rate and regular rhythm.   Pulmonary/Chest: Effort normal and breath sounds without rales or wheezing.  Abd:  Soft, NT, ND, + BS, no organomegaly Neurological: Pt is alert. At baseline orientation, motor intact Right ankle 2+ effusion, warm, mild tender Skin: Skin is warm. No rashes, other new lesions, no LE edema Psychiatric: Pt behavior is normal without agitation  All otherwise neg per pt Lab Results  Component Value Date   WBC 9.6 11/20/2019   HGB 12.9 11/20/2019   HCT 39.8 11/20/2019   PLT 326 11/20/2019   GLUCOSE 144 (H) 11/20/2019   CHOL 142 07/27/2019   TRIG 98.0 07/27/2019   HDL 47.80 07/27/2019   LDLDIRECT 135.0 07/05/2011   LDLCALC 75 07/27/2019   ALT 12 07/27/2019   AST 13 07/27/2019   NA 139 11/20/2019   K 4.0 11/20/2019   CL 107 11/20/2019   CREATININE 0.67 11/20/2019   BUN 10 11/20/2019   CO2 24 11/20/2019   TSH 1.60 07/27/2019   INR 1.3 11/17/2012   HGBA1C 6.7 (H) 07/27/2019   MICROALBUR <0.7 07/27/2019          Assessment & Plan:

## 2019-12-14 NOTE — Assessment & Plan Note (Signed)
With persistent swelling post fall  - for sports med referral

## 2019-12-15 ENCOUNTER — Other Ambulatory Visit: Payer: Self-pay | Admitting: Neurology

## 2019-12-17 DIAGNOSIS — Z6841 Body Mass Index (BMI) 40.0 and over, adult: Secondary | ICD-10-CM | POA: Diagnosis not present

## 2019-12-17 DIAGNOSIS — M5136 Other intervertebral disc degeneration, lumbar region: Secondary | ICD-10-CM | POA: Diagnosis not present

## 2019-12-17 DIAGNOSIS — M545 Low back pain: Secondary | ICD-10-CM | POA: Diagnosis not present

## 2019-12-18 ENCOUNTER — Encounter: Payer: Self-pay | Admitting: Internal Medicine

## 2019-12-18 MED ORDER — ALPRAZOLAM 0.5 MG PO TABS: ORAL_TABLET | ORAL | 2 refills | Status: DC

## 2019-12-18 NOTE — Telephone Encounter (Signed)
Done erx 

## 2019-12-21 ENCOUNTER — Ambulatory Visit: Payer: BC Managed Care – PPO | Admitting: Endocrinology

## 2019-12-30 ENCOUNTER — Other Ambulatory Visit: Payer: Self-pay | Admitting: Internal Medicine

## 2019-12-30 NOTE — Telephone Encounter (Signed)
Please refill as per office routine med refill policy (all routine meds refilled for 3 mo or monthly per pt preference up to one year from last visit, then month to month grace period for 3 mo, then further med refills will have to be denied)  

## 2020-01-08 DIAGNOSIS — Z79899 Other long term (current) drug therapy: Secondary | ICD-10-CM | POA: Diagnosis not present

## 2020-01-08 DIAGNOSIS — M79606 Pain in leg, unspecified: Secondary | ICD-10-CM | POA: Diagnosis not present

## 2020-01-08 DIAGNOSIS — G894 Chronic pain syndrome: Secondary | ICD-10-CM | POA: Diagnosis not present

## 2020-01-08 DIAGNOSIS — Z79891 Long term (current) use of opiate analgesic: Secondary | ICD-10-CM | POA: Diagnosis not present

## 2020-01-08 DIAGNOSIS — M47817 Spondylosis without myelopathy or radiculopathy, lumbosacral region: Secondary | ICD-10-CM | POA: Diagnosis not present

## 2020-01-08 DIAGNOSIS — M542 Cervicalgia: Secondary | ICD-10-CM | POA: Diagnosis not present

## 2020-01-11 ENCOUNTER — Ambulatory Visit: Payer: BC Managed Care – PPO | Admitting: Family Medicine

## 2020-01-18 ENCOUNTER — Ambulatory Visit: Payer: BC Managed Care – PPO | Admitting: Endocrinology

## 2020-01-24 ENCOUNTER — Other Ambulatory Visit: Payer: Self-pay

## 2020-01-24 ENCOUNTER — Encounter (INDEPENDENT_AMBULATORY_CARE_PROVIDER_SITE_OTHER): Payer: Self-pay

## 2020-01-24 ENCOUNTER — Ambulatory Visit: Payer: BC Managed Care – PPO

## 2020-01-24 ENCOUNTER — Ambulatory Visit: Payer: BC Managed Care – PPO | Admitting: Internal Medicine

## 2020-01-24 ENCOUNTER — Encounter: Payer: Self-pay | Admitting: Internal Medicine

## 2020-01-24 VITALS — BP 132/84 | HR 83 | Temp 98.3°F | Ht 67.0 in | Wt 335.0 lb

## 2020-01-24 DIAGNOSIS — E1140 Type 2 diabetes mellitus with diabetic neuropathy, unspecified: Secondary | ICD-10-CM | POA: Diagnosis not present

## 2020-01-24 DIAGNOSIS — Z794 Long term (current) use of insulin: Secondary | ICD-10-CM | POA: Diagnosis not present

## 2020-01-24 DIAGNOSIS — I10 Essential (primary) hypertension: Secondary | ICD-10-CM

## 2020-01-24 DIAGNOSIS — J449 Chronic obstructive pulmonary disease, unspecified: Secondary | ICD-10-CM

## 2020-01-24 DIAGNOSIS — E785 Hyperlipidemia, unspecified: Secondary | ICD-10-CM

## 2020-01-24 DIAGNOSIS — K219 Gastro-esophageal reflux disease without esophagitis: Secondary | ICD-10-CM

## 2020-01-24 LAB — LIPID PANEL
Cholesterol: 145 mg/dL (ref 0–200)
HDL: 47.2 mg/dL (ref >39.00)
LDL Cholesterol: 72 mg/dL (ref 0–99)
NonHDL: 98.24
Total CHOL/HDL Ratio: 3
Triglycerides: 130 mg/dL (ref 0.0–149.0)
VLDL: 26 mg/dL (ref 0.0–40.0)

## 2020-01-24 LAB — HEPATIC FUNCTION PANEL
ALT: 11 U/L (ref 0–35)
AST: 14 U/L (ref 0–37)
Albumin: 4.2 g/dL (ref 3.5–5.2)
Alkaline Phosphatase: 143 U/L — ABNORMAL HIGH (ref 39–117)
Bilirubin, Direct: 0.1 mg/dL (ref 0.0–0.3)
Total Bilirubin: 0.4 mg/dL (ref 0.2–1.2)
Total Protein: 7.4 g/dL (ref 6.0–8.3)

## 2020-01-24 LAB — BASIC METABOLIC PANEL
BUN: 8 mg/dL (ref 6–23)
CO2: 24 mEq/L (ref 19–32)
Calcium: 9.2 mg/dL (ref 8.4–10.5)
Chloride: 106 mEq/L (ref 96–112)
Creatinine, Ser: 0.7 mg/dL (ref 0.40–1.20)
GFR: 108.98 mL/min (ref >60.00)
Glucose, Bld: 157 mg/dL — ABNORMAL HIGH (ref 70–99)
Potassium: 3.7 mEq/L (ref 3.5–5.1)
Sodium: 138 mEq/L (ref 135–145)

## 2020-01-24 LAB — HEMOGLOBIN A1C: Hgb A1c MFr Bld: 6.3 % (ref 4.6–6.5)

## 2020-01-24 MED ORDER — OMEPRAZOLE 40 MG PO CPDR: DELAYED_RELEASE_CAPSULE | ORAL | 3 refills | Status: DC

## 2020-01-24 MED ORDER — TELMISARTAN 40 MG PO TABS: ORAL_TABLET | ORAL | 3 refills | Status: DC

## 2020-01-24 NOTE — Patient Instructions (Addendum)
Thanks, [name], your appointment is scheduled! [place], [place], [address] Thursday January 24, 2020 Arrive by 11:45 AM Starts at 11:45 AM (15 minutes)   [place] [phone]  This visit is at the [address] [place], [place] [address]. Someone will direct you where to park.  Please continue all other medications as before, and refills have been done if requested.  Please have the pharmacy call with any other refills you may need.  Please continue your efforts at being more active, low cholesterol diet, and weight control.  You are otherwise up to date with prevention measures today.  Please keep your appointments with your specialists as you may have planned  You will be contacted regarding the referral for: Diabetic education  Please go to the LAB at the blood drawing area for the tests to be done  You will be contacted by phone if any changes need to be made immediately.  Otherwise, you will receive a letter about your results with an explanation, but please check with MyChart first.  Please remember to sign up for MyChart if you have not done so, as this will be important to you in the future with finding out test results, communicating by private email, and scheduling acute appointments online when needed.  Please make an Appointment to return in 6 months, or sooner if needed

## 2020-01-24 NOTE — Progress Notes (Signed)
Subjective:    Patient ID: Nicole Melendez, female    DOB: 03-May-1976, 44 y.o.   MRN: [id number]  HPI  Here to f/u; overall doing ok,  Pt denies chest pain, increasing sob or doe, wheezing, orthopnea, PND, increased LE swelling, palpitations, dizziness or syncope.  Pt denies new neurological symptoms such as new headache, or facial or extremity weakness or numbness.  Pt denies polydipsia, polyuria, or low sugar episode.  Pt states overall good compliance with meds, mostly trying to follow appropriate diet, with wt overall stable,  but little exercise however. Now taking the insluin as prescribed with good compliance.  Ran out of micardis.  Denies worsening reflux, abd pain, dysphagia, n/v, bowel change or blood. Past Medical History:  Diagnosis Date  . Allergic rhinitis, cause unspecified   . Anemia   . Anxiety   . Depression   . Diabetes mellitus type II    steroid related, patient states "im no longer diabetic"  . Essential hypertension 08/08/2015  . GERD (gastroesophageal reflux disease)   . History of blood transfusion   . Hyperlipidemia   . Menorrhagia   . Migraine   . Morbid obesity ([place])   . Otitis media 06/28/2015  . Peptic ulcer disease   . Positive ANA (antinuclear antibody) 02/14/2012  . Sarcoid    including hand per rheumatology-Dr. Amil Amen  . Sarcoidosis of lung ([place])   . Shortness of breath    on exertion  . Varicose veins with pain    Past Surgical History:  Procedure Laterality Date  . ABDOMINAL HYSTERECTOMY    . COLONOSCOPY WITH PROPOFOL N/A 10/19/2016   Procedure: COLONOSCOPY WITH PROPOFOL;  Surgeon: Mauri Pole, MD;  Location: [facility];  Service: Endoscopy;  Laterality: N/A;  . ESOPHAGOGASTRODUODENOSCOPY (EGD) WITH PROPOFOL N/A 10/19/2016   Procedure: ESOPHAGOGASTRODUODENOSCOPY (EGD) WITH PROPOFOL;  Surgeon: Mauri Pole, MD;  Location: [facility];  Service: Endoscopy;  Laterality: N/A;  . HERNIA MESH REMOVAL  02/2013  . uterine ablation   03/2010  . WISDOM TOOTH EXTRACTION      reports that she quit smoking about 7 years ago. Her smoking use included cigarettes. She has a 20.00 pack-year smoking history. She has never used smokeless tobacco. She reports that she does not drink alcohol or use drugs. family history includes Allergies in her mother; Bone cancer in her maternal aunt; Breast cancer in her maternal aunt; Diabetes in her father and mother; Heart attack in her mother; Heart disease in her father; Hypertension in an other family member; Lung cancer in her maternal aunt; Ovarian cancer in her maternal aunt; Rheum arthritis in her father; Stroke in her father and maternal uncle. Allergies  Allergen Reactions  . Azithromycin Hives    (z pak) hives  . Bee Venom Anaphylaxis  . Flagyl [Metronidazole] Hives and Shortness Of Breath  . Penicillins Shortness Of Breath and Swelling    Has patient had a PCN reaction causing immediate rash, facial/tongue/throat swelling, SOB or lightheadedness with hypotension: Yes Has patient had a PCN reaction causing severe rash involving mucus membranes or skin necrosis: No Has patient had a PCN reaction that required hospitalization No Has patient had a PCN reaction occurring within the last 10 years: No If all of the above answers are "NO", then may proceed with Cephalosporin use.  REACTION: swelling and difficulty breathing  . Shellfish Allergy Anaphylaxis  . Klonopin [Clonazepam] Other (See Comments)    Memory difficulty   Current Outpatient Medications on File Prior to  Visit  Medication Sig Dispense Refill  . acetaminophen (TYLENOL) 500 MG tablet Take 1 tablet (500 mg total) by mouth every 6 (six) hours as needed. 30 tablet 0  . acetaminophen (TYLENOL) 650 MG CR tablet Take 650 mg by mouth every 8 (eight) hours as needed for pain.    Marland Kitchen albuterol (PROVENTIL HFA;VENTOLIN HFA) 108 (90 Base) MCG/ACT inhaler Inhale 2 puffs into the lungs every 4 (four) hours as needed for wheezing or  shortness of breath. 1 Inhaler 2  . ALPRAZolam (XANAX) 0.5 MG tablet TAKE 1 TABLET(0.5 MG) BY MOUTH THREE TIMES DAILY AS NEEDED 90 tablet 2  . azelastine (ASTELIN) 0.1 % nasal spray Place 2 sprays into both nostrils 2 (two) times daily. Use in each nostril as directed 30 mL 12  . Blood Glucose Monitoring Suppl (ONETOUCH VERIO) w/Device KIT Use as directed daily E11.9 1 kit 0  . cetirizine (ZYRTEC) 10 MG tablet TAKE 1 TABLET(10 MG) BY MOUTH DAILY 90 tablet 1  . cyclobenzaprine (FLEXERIL) 5 MG tablet Take 1 tablet (5 mg total) by mouth 3 (three) times daily as needed for muscle spasms. 40 tablet 1  . Diclofenac Sodium (PENNSAID) 2 % SOLN Apply 1 pump twice daily as needed. (Patient taking differently: Apply 1 Pump topically 2 (two) times daily. Apply 1 pump twice daily) 112 g 3  . fluticasone (FLONASE) 50 MCG/ACT nasal spray Place 2 sprays into both nostrils daily. 16 g 0  . furosemide (LASIX) 40 MG tablet 1 tab by mouth every day in the AM and then 1 as needed only for persistent swelling in the PM (Patient taking differently: Take 20 mg by mouth daily as needed for fluid. ) 60 tablet 11  . gabapentin (NEURONTIN) 300 MG capsule TAKE 2 CAPSULES(600 MG) BY MOUTH THREE TIMES DAILY 270 capsule 5  . glucose blood (ONETOUCH VERIO) test strip Use as instructed three times daily E11.9 100 each 12  . Insulin Lispro (HUMALOG KWIKPEN) 200 UNIT/ML SOPN Inject 12 Units into the skin 4 (four) times daily. WITH MEALS W/ SLIDING SCALE ASD E11.9 5 pen 11  . Insulin Pen Needle (BD PEN NEEDLE NANO U/F) 32G X 4 MM MISC Use as directed three times daily E11.9 300 each 3  . levalbuterol (XOPENEX HFA) 45 MCG/ACT inhaler Inhale 1-2 puffs into the lungs every 8 (eight) hours as needed for wheezing or shortness of breath. USE 2 PUFFS 3 TIMES DAILY X 5 DAYS THEN BACK TO HOME REGIMEN AS NEEDED. 1 Inhaler 0  . mometasone-formoterol (DULERA) 200-5 MCG/ACT AERO Inhale 2 puffs into the lungs 2 (two) times a day. 8.8 g 5  .  oxyCODONE-acetaminophen (PERCOCET) 10-325 MG tablet Take 1 tablet by mouth every 8 (eight) hours as needed for pain.   0  . potassium chloride (K-DUR) 10 MEQ tablet 3 tab by mouth per day only when taking the lasix (Patient taking differently: Take 30 mEq by mouth daily as needed. 3 tab by mouth per day as needed when taking the lasix) 90 tablet 11  . rizatriptan (MAXALT-MLT) 10 MG disintegrating tablet Take 1 tablet earliest onset of migraine.  May repeat in 2 hours if needed.  Maximum 2 tablets in 24 hours (Patient taking differently: Take 10 mg by mouth every 2 (two) hours as needed for migraine. Take 1 tablet earliest onset of migraine.  May repeat in 2 hours if needed.  Maximum 2 tablets in 24 hours) 9 tablet 3  . rosuvastatin (CRESTOR) 40 MG tablet TAKE 1 TABLET(40  MG) BY MOUTH DAILY 90 tablet 2  . sertraline (ZOLOFT) 100 MG tablet TAKE 2 TABLETS(200 MG) BY MOUTH DAILY 180 tablet 1  . TURMERIC PO Take 1 tablet by mouth daily.    . vitamin B-12 (CYANOCOBALAMIN) 1000 MCG tablet Take 1,000 mcg by mouth daily.    Marland Kitchen zolpidem (AMBIEN) 10 MG tablet Take 1 tablet (10 mg total) by mouth at bedtime as needed for sleep. 90 tablet 1  . zonisamide (ZONEGRAN) 100 MG capsule TAKE 2 CAPSULES(200 MG) BY MOUTH DAILY 60 capsule 5  . ondansetron (ZOFRAN) 4 MG tablet Take 1 tablet (4 mg total) by mouth every 6 (six) hours. 30 tablet 0   No current facility-administered medications on file prior to visit.   Review of Systems All otherwise neg per pt     Objective:   Physical Exam BP 132/84   Pulse 83   Temp 98.3 F (36.8 C)   Ht '5\' 7"'  (1.702 m)   Wt (!) 335 lb (152 kg)   LMP 07/17/2012   SpO2 99%   BMI 52.47 kg/m  VS noted,  Constitutional: Pt appears in NAD HENT: Head: NCAT.  Right Ear: External ear normal.  Left Ear: External ear normal.  Eyes: . Pupils are equal, round, and reactive to light. Conjunctivae and EOM are normal Nose: without d/c or deformity Neck: Neck supple. Gross normal  ROM Cardiovascular: Normal rate and regular rhythm.   Pulmonary/Chest: Effort normal and breath sounds without rales or wheezing.  Abd:  Soft, NT, ND, + BS, no organomegaly Neurological: Pt is alert. At baseline orientation, motor grossly intact Skin: Skin is warm. No rashes, other new lesions, no LE edema Psychiatric: Pt behavior is normal without agitation  All otherwise neg per pt  Lab Results  Component Value Date   WBC 9.6 11/20/2019   HGB 12.9 11/20/2019   HCT 39.8 11/20/2019   PLT 326 11/20/2019   GLUCOSE 144 (H) 11/20/2019   CHOL 142 07/27/2019   TRIG 98.0 07/27/2019   HDL 47.80 07/27/2019   LDLDIRECT 135.0 07/05/2011   LDLCALC 75 07/27/2019   ALT 12 07/27/2019   AST 13 07/27/2019   NA 139 11/20/2019   K 4.0 11/20/2019   CL 107 11/20/2019   CREATININE 0.67 11/20/2019   BUN 10 11/20/2019   CO2 24 11/20/2019   TSH 1.60 07/27/2019   INR 1.3 11/17/2012   HGBA1C 6.7 (H) 07/27/2019   MICROALBUR <0.7 07/27/2019      Assessment & Plan:

## 2020-01-25 ENCOUNTER — Ambulatory Visit: Payer: BC Managed Care – PPO | Admitting: Internal Medicine

## 2020-01-25 ENCOUNTER — Encounter: Payer: Self-pay | Admitting: Internal Medicine

## 2020-01-25 NOTE — Assessment & Plan Note (Signed)
stable overall by history and exam, recent data reviewed with pt, and pt to continue medical treatment as before,  to f/u any worsening symptoms or concerns  

## 2020-01-25 NOTE — Assessment & Plan Note (Addendum)
stable overall by history and exam, recent data reviewed with pt, and pt to continue medical treatment as before,  to f/u any worsening symptoms or concerns, refer DM education  I spent 31 minutes in preparing to see the patient by review of recent labs, imaging and procedures, obtaining and reviewing separately obtained history, communicating with the patient and family or caregiver, ordering medications, tests or procedures, and documenting clinical information in the EHR including the differential Dx, treatment, and any further evaluation and other management of DM, HTN, HLD, GERD, COPD

## 2020-02-05 DIAGNOSIS — M47817 Spondylosis without myelopathy or radiculopathy, lumbosacral region: Secondary | ICD-10-CM | POA: Diagnosis not present

## 2020-02-05 DIAGNOSIS — M542 Cervicalgia: Secondary | ICD-10-CM | POA: Diagnosis not present

## 2020-02-05 DIAGNOSIS — M79606 Pain in leg, unspecified: Secondary | ICD-10-CM | POA: Diagnosis not present

## 2020-02-05 DIAGNOSIS — G894 Chronic pain syndrome: Secondary | ICD-10-CM | POA: Diagnosis not present

## 2020-02-08 ENCOUNTER — Encounter: Payer: Self-pay | Admitting: Family Medicine

## 2020-02-08 ENCOUNTER — Ambulatory Visit: Payer: BC Managed Care – PPO | Admitting: Family Medicine

## 2020-02-08 ENCOUNTER — Ambulatory Visit (INDEPENDENT_AMBULATORY_CARE_PROVIDER_SITE_OTHER): Payer: BC Managed Care – PPO

## 2020-02-08 ENCOUNTER — Other Ambulatory Visit: Payer: Self-pay

## 2020-02-08 VITALS — BP 128/84 | HR 97 | Ht 67.0 in | Wt 333.0 lb

## 2020-02-08 DIAGNOSIS — M66871 Spontaneous rupture of other tendons, right ankle and foot: Secondary | ICD-10-CM | POA: Diagnosis not present

## 2020-02-08 DIAGNOSIS — G8929 Other chronic pain: Secondary | ICD-10-CM

## 2020-02-08 DIAGNOSIS — M25571 Pain in right ankle and joints of right foot: Secondary | ICD-10-CM

## 2020-02-08 MED ORDER — VITAMIN D (ERGOCALCIFEROL) 1.25 MG (50000 UNIT) PO CAPS: 50000.0000 [IU] | ORAL_CAPSULE | ORAL | 0 refills | Status: DC

## 2020-02-08 MED ORDER — PREDNISONE 50 MG PO TABS: ORAL_TABLET | ORAL | 0 refills | Status: DC

## 2020-02-08 NOTE — Progress Notes (Signed)
[place] [address] [place] Phone: [phone] Subjective:   I Nicole Melendez am serving as a Education administrator for Dr. Hulan Saas.  This visit occurred during the SARS-CoV-2 public health emergency.  Safety protocols were in place, including screening questions prior to the visit, additional usage of staff PPE, and extensive cleaning of exam room while observing appropriate contact time as indicated for disinfecting solutions.   I'm seeing this patient by the request  of:  Biagio Borg, MD  CC: Right ankle pain  XIP:[id number]  Nicole Melendez is a 44 y.o. female coming in with complaint of right ankle pain. [place] off porch in December 2020. Pain over tarsal bone on dorsum of foot and over medial malleolus. Pain is dull but has sharp pain with weight bearing. Used boot from December until mid-February.  Patient has seen another provider and then just told her to continue to wear the boot.  We have seen patient before nearly 3 years ago for a peroneal tendinitis but all the pain seems to be on the medial aspect.     Past Medical History:  Diagnosis Date  . Allergic rhinitis, cause unspecified   . Anemia   . Anxiety   . Depression   . Diabetes mellitus type II    steroid related, patient states "im no longer diabetic"  . Essential hypertension 08/08/2015  . GERD (gastroesophageal reflux disease)   . History of blood transfusion   . Hyperlipidemia   . Menorrhagia   . Migraine   . Morbid obesity ([place])   . Otitis media 06/28/2015  . Peptic ulcer disease   . Positive ANA (antinuclear antibody) 02/14/2012  . Sarcoid    including hand per rheumatology-Dr. Amil Amen  . Sarcoidosis of lung ([place])   . Shortness of breath    on exertion  . Varicose veins with pain    Past Surgical History:  Procedure Laterality Date  . ABDOMINAL HYSTERECTOMY    . COLONOSCOPY WITH PROPOFOL N/A 10/19/2016   Procedure: COLONOSCOPY WITH PROPOFOL;  Surgeon:  Mauri Pole, MD;  Location: [facility];  Service: Endoscopy;  Laterality: N/A;  . ESOPHAGOGASTRODUODENOSCOPY (EGD) WITH PROPOFOL N/A 10/19/2016   Procedure: ESOPHAGOGASTRODUODENOSCOPY (EGD) WITH PROPOFOL;  Surgeon: Mauri Pole, MD;  Location: [facility];  Service: Endoscopy;  Laterality: N/A;  . HERNIA MESH REMOVAL  02/2013  . uterine ablation  03/2010  . WISDOM TOOTH EXTRACTION     Social History   Socioeconomic History  . Marital status: Single    Spouse name: Not on file  . Number of children: 0  . Years of education: Not on file  . Highest education level: Master's degree (e.g., MA, MS, MEng, MEd, MSW, MBA)  Occupational History  . Occupation: personal shopper  Tobacco Use  . Smoking status: Former Smoker    Packs/day: 1.00    Years: 20.00    Pack years: 20.00    Types: Cigarettes    Quit date: 10/25/2012    Years since quitting: 7.2  . Smokeless tobacco: Never Used  . Tobacco comment: QUIT 04/2010 AND STARTED BACK 2014 X 3 MONTHS. less than 1 ppd.  started at age 69.    Substance and Sexual Activity  . Alcohol use: No  . Drug use: No  . Sexual activity: Yes    Birth control/protection: None  Other Topics Concern  . Not on file  Social History Narrative   Pt lives with [name]- also pt  of [name]      Has 2 bachelors/1 masters; is working; R handed - one level home; R handed; rare caffeine;       Social Determinants of Health   Financial Resource Strain:   . Difficulty of Paying Living Expenses:   Food Insecurity:   . Worried About Charity fundraiser in the Last Year:   . Arboriculturist in the Last Year:   Transportation Needs:   . Film/video editor (Medical):   Marland Kitchen Lack of Transportation (Non-Medical):   Physical Activity:   . Days of Exercise per Week:   . Minutes of Exercise per Session:   Stress:   . Feeling of Stress :   Social Connections:   . Frequency of Communication with Friends and Family:   . Frequency of Social Gatherings  with Friends and Family:   . Attends Religious Services:   . Active Member of Clubs or Organizations:   . Attends Archivist Meetings:   Marland Kitchen Marital Status:    Allergies  Allergen Reactions  . Azithromycin Hives    (z pak) hives  . Bee Venom Anaphylaxis  . Flagyl [Metronidazole] Hives and Shortness Of Breath  . Penicillins Shortness Of Breath and Swelling    Has patient had a PCN reaction causing immediate rash, facial/tongue/throat swelling, SOB or lightheadedness with hypotension: Yes Has patient had a PCN reaction causing severe rash involving mucus membranes or skin necrosis: No Has patient had a PCN reaction that required hospitalization No Has patient had a PCN reaction occurring within the last 10 years: No If all of the above answers are "NO", then may proceed with Cephalosporin use.  REACTION: swelling and difficulty breathing  . Shellfish Allergy Anaphylaxis  . Klonopin [Clonazepam] Other (See Comments)    Memory difficulty   Family History  Problem Relation Age of Onset  . Allergies Mother   . Heart attack Mother   . Diabetes Mother   . Diabetes Father   . Heart disease Father   . Rheum arthritis Father   . Stroke Father   . Hypertension Other   . Ovarian cancer Maternal Aunt   . Lung cancer Maternal Aunt   . Breast cancer Maternal Aunt   . Bone cancer Maternal Aunt   . Stroke Maternal Uncle   . Other Neg Hx     Current Outpatient Medications (Endocrine & Metabolic):  Marland Kitchen  Insulin Lispro (HUMALOG KWIKPEN) 200 UNIT/ML SOPN, Inject 12 Units into the skin 4 (four) times daily. WITH MEALS W/ SLIDING SCALE ASD E11.9 .  predniSONE (DELTASONE) 50 MG tablet, Take one tablet daily for the next 5 days.  Current Outpatient Medications (Cardiovascular):  .  furosemide (LASIX) 40 MG tablet, 1 tab by mouth every day in the AM and then 1 as needed only for persistent swelling in the PM (Patient taking differently: Take 20 mg by mouth daily as needed for fluid. ) .   rosuvastatin (CRESTOR) 40 MG tablet, TAKE 1 TABLET(40 MG) BY MOUTH DAILY .  telmisartan (MICARDIS) 40 MG tablet, TAKE 1 TABLET(40 MG) BY MOUTH DAILY  Current Outpatient Medications (Respiratory):  .  albuterol (PROVENTIL HFA;VENTOLIN HFA) 108 (90 Base) MCG/ACT inhaler, Inhale 2 puffs into the lungs every 4 (four) hours as needed for wheezing or shortness of breath. Marland Kitchen  azelastine (ASTELIN) 0.1 % nasal spray, Place 2 sprays into both nostrils 2 (two) times daily. Use in each nostril as directed .  cetirizine (ZYRTEC) 10 MG tablet, TAKE  1 TABLET(10 MG) BY MOUTH DAILY .  fluticasone (FLONASE) 50 MCG/ACT nasal spray, Place 2 sprays into both nostrils daily. Marland Kitchen  levalbuterol (XOPENEX HFA) 45 MCG/ACT inhaler, Inhale 1-2 puffs into the lungs every 8 (eight) hours as needed for wheezing or shortness of breath. USE 2 PUFFS 3 TIMES DAILY X 5 DAYS THEN BACK TO HOME REGIMEN AS NEEDED. Marland Kitchen  mometasone-formoterol (DULERA) 200-5 MCG/ACT AERO, Inhale 2 puffs into the lungs 2 (two) times a day.  Current Outpatient Medications (Analgesics):  .  acetaminophen (TYLENOL) 500 MG tablet, Take 1 tablet (500 mg total) by mouth every 6 (six) hours as needed. Marland Kitchen  acetaminophen (TYLENOL) 650 MG CR tablet, Take 650 mg by mouth every 8 (eight) hours as needed for pain. Marland Kitchen  oxyCODONE-acetaminophen (PERCOCET) 10-325 MG tablet, Take 1 tablet by mouth every 8 (eight) hours as needed for pain.  .  rizatriptan (MAXALT-MLT) 10 MG disintegrating tablet, Take 1 tablet earliest onset of migraine.  May repeat in 2 hours if needed.  Maximum 2 tablets in 24 hours (Patient taking differently: Take 10 mg by mouth every 2 (two) hours as needed for migraine. Take 1 tablet earliest onset of migraine.  May repeat in 2 hours if needed.  Maximum 2 tablets in 24 hours)  Current Outpatient Medications (Hematological):  .  vitamin B-12 (CYANOCOBALAMIN) 1000 MCG tablet, Take 1,000 mcg by mouth daily.  Current Outpatient Medications (Other):  Marland Kitchen  ALPRAZolam  (XANAX) 0.5 MG tablet, TAKE 1 TABLET(0.5 MG) BY MOUTH THREE TIMES DAILY AS NEEDED .  Blood Glucose Monitoring Suppl (ONETOUCH VERIO) w/Device KIT, Use as directed daily E11.9 .  cyclobenzaprine (FLEXERIL) 5 MG tablet, Take 1 tablet (5 mg total) by mouth 3 (three) times daily as needed for muscle spasms. .  Diclofenac Sodium (PENNSAID) 2 % SOLN, Apply 1 pump twice daily as needed. (Patient taking differently: Apply 1 Pump topically 2 (two) times daily. Apply 1 pump twice daily) .  gabapentin (NEURONTIN) 300 MG capsule, TAKE 2 CAPSULES(600 MG) BY MOUTH THREE TIMES DAILY .  glucose blood (ONETOUCH VERIO) test strip, Use as instructed three times daily E11.9 .  Insulin Pen Needle (BD PEN NEEDLE NANO U/F) 32G X 4 MM MISC, Use as directed three times daily E11.9 .  omeprazole (PRILOSEC) 40 MG capsule, TAKE 1 CAPSULE(40 MG) BY MOUTH DAILY .  ondansetron (ZOFRAN) 4 MG tablet, Take 1 tablet (4 mg total) by mouth every 6 (six) hours. .  potassium chloride (K-DUR) 10 MEQ tablet, 3 tab by mouth per day only when taking the lasix (Patient taking differently: Take 30 mEq by mouth daily as needed. 3 tab by mouth per day as needed when taking the lasix) .  sertraline (ZOLOFT) 100 MG tablet, TAKE 2 TABLETS(200 MG) BY MOUTH DAILY .  TURMERIC PO, Take 1 tablet by mouth daily. .  Vitamin D, Ergocalciferol, (DRISDOL) 1.25 MG (50000 UNIT) CAPS capsule, Take 1 capsule (50,000 Units total) by mouth every 7 (seven) days. Marland Kitchen  zolpidem (AMBIEN) 10 MG tablet, Take 1 tablet (10 mg total) by mouth at bedtime as needed for sleep. Marland Kitchen  zonisamide (ZONEGRAN) 100 MG capsule, TAKE 2 CAPSULES(200 MG) BY MOUTH DAILY   Reviewed prior external information including notes and imaging from  primary care provider As well as notes that were available from care everywhere and other healthcare systems.  Past medical history, social, surgical and family history all reviewed in electronic medical record.  No pertanent information unless stated  regarding to the chief complaint.  Review of Systems:  No headache, visual changes, nausea, vomiting, diarrhea, constipation, dizziness, abdominal pain, skin rash, fevers, chills, night sweats, weight loss, swollen lymph nodes, body aches, joint swelling, chest pain, shortness of breath, mood changes. POSITIVE muscle aches  Objective  Blood pressure 128/84, pulse 97, height '5\' 7"'  (1.702 m), weight (!) 333 lb (151 kg), last menstrual period 07/17/2012, SpO2 98 %.   General: No apparent distress alert and oriented x3 mood and affect normal, dressed appropriately.  HEENT: Pupils equal, extraocular movements intact  Respiratory: Patient's speak in full sentences and does not appear short of breath  Cardiovascular: No lower extremity edema, non tender, no erythema  Neuro: Cranial nerves II through XII are intact, neurovascularly intact in all extremities with 2+ DTRs and 2+ pulses.  Gait normal with good balance and coordination.  MSK:  tender with full range of motion and good stability and symmetric strength and tone of shoulders, elbows, wrist, hip, knee and bilaterally.  Right ankle exam shows the patient does have swelling over the medial malleolus area.  Tender to palpation unfortunately over the navicular bone.  Limited range of motion of the ankle.  Trace swelling noted.  Neurovascular intact distally.  Limited musculoskeletal ultrasound was performed and interpreted by Lyndal Pulley  Limited ultrasound of patient's right ankle shows that patient does have a cortical irregularity of the navicular bone and does have an effusion of the joint noted.  Increasing Doppler flow noted mild to moderate arthritic changes of the ankle joint noted.  Questionable tear noted versus a severe tendinitis of the posterior tibialis tendon.   Impression and Recommendations:     This case required medical decision making of moderate complexity. The above documentation has been reviewed and is accurate and  complete Lyndal Pulley, DO       Note: This dictation was prepared with Dragon dictation along with smaller phrase technology. Any transcriptional errors that result from this process are unintentional.

## 2020-02-08 NOTE — Assessment & Plan Note (Signed)
Patient is going to do a heel lift, x-rays ordered today to look at the navicular bone.  Patient did have a small effusion of the ankle and is concerned for the potential for gout.  Does have rheumatoid arthritis diagnosed likely contributing and we will need to monitor the navicular bone closely.  Patient had already been wearing the cam walker and not making any significant improvement.  Discussed icing regimen and topical anti-inflammatories.  Follow-up with me again in 4 to 8 weeks

## 2020-02-08 NOTE — Patient Instructions (Addendum)
Prednisone for 5 days Ankle xray today Pennsaid Ice Exercises Heel lift in shoe 1/8 inch Avoid being barefoot Once Weekly Vit D Tart cherry extract 1200mg  at night  See me again in 4 weeks

## 2020-02-19 ENCOUNTER — Other Ambulatory Visit: Payer: Self-pay | Admitting: Internal Medicine

## 2020-02-20 ENCOUNTER — Telehealth: Payer: Self-pay

## 2020-02-20 ENCOUNTER — Other Ambulatory Visit: Payer: Self-pay

## 2020-02-20 ENCOUNTER — Encounter: Payer: Self-pay | Admitting: Internal Medicine

## 2020-02-20 ENCOUNTER — Ambulatory Visit (INDEPENDENT_AMBULATORY_CARE_PROVIDER_SITE_OTHER): Payer: BC Managed Care – PPO | Admitting: Internal Medicine

## 2020-02-20 VITALS — BP 130/72 | HR 102 | Temp 98.3°F | Ht 67.0 in | Wt 337.0 lb

## 2020-02-20 DIAGNOSIS — R112 Nausea with vomiting, unspecified: Secondary | ICD-10-CM

## 2020-02-20 DIAGNOSIS — M2550 Pain in unspecified joint: Secondary | ICD-10-CM | POA: Diagnosis not present

## 2020-02-20 DIAGNOSIS — J449 Chronic obstructive pulmonary disease, unspecified: Secondary | ICD-10-CM | POA: Diagnosis not present

## 2020-02-20 DIAGNOSIS — B370 Candidal stomatitis: Secondary | ICD-10-CM

## 2020-02-20 DIAGNOSIS — E1140 Type 2 diabetes mellitus with diabetic neuropathy, unspecified: Secondary | ICD-10-CM

## 2020-02-20 DIAGNOSIS — Z794 Long term (current) use of insulin: Secondary | ICD-10-CM

## 2020-02-20 MED ORDER — METHYLPREDNISOLONE ACETATE 80 MG/ML IJ SUSP
80.0000 mg | Freq: Once | INTRAMUSCULAR | Status: AC
  Administered 2020-02-20: 80 mg via INTRAMUSCULAR

## 2020-02-20 MED ORDER — ONDANSETRON 4 MG PO TBDP
4.0000 mg | ORAL_TABLET | Freq: Three times a day (TID) | ORAL | 1 refills | Status: DC | PRN
Start: 2020-02-20 — End: 2020-09-22

## 2020-02-20 MED ORDER — FLUCONAZOLE 100 MG PO TABS: 100.0000 mg | ORAL_TABLET | Freq: Every day | ORAL | 0 refills | Status: DC

## 2020-02-20 MED ORDER — NYSTATIN 100000 UNIT/ML MT SUSP
500000.0000 [IU] | Freq: Four times a day (QID) | OROMUCOSAL | 1 refills | Status: AC
Start: 2020-02-20 — End: 2020-03-01

## 2020-02-20 MED ORDER — CELECOXIB 200 MG PO CAPS: 200.0000 mg | ORAL_CAPSULE | Freq: Two times a day (BID) | ORAL | 2 refills | Status: DC | PRN

## 2020-02-20 MED ORDER — COLCHICINE 0.6 MG PO TABS: 0.6000 mg | ORAL_TABLET | Freq: Every day | ORAL | 5 refills | Status: DC

## 2020-02-20 NOTE — Telephone Encounter (Signed)
Key: [id number]

## 2020-02-20 NOTE — Assessment & Plan Note (Signed)
stable overall by history and exam, recent data reviewed with pt, and pt to continue medical treatment as before,  to f/u any worsening symptoms or concerns  

## 2020-02-20 NOTE — Assessment & Plan Note (Addendum)
For depomedrol IM, celebrex bid prn, colchicine bid prn,cont tumeric and tart cherry,  to f/u any worsening symptoms or concerns  I spent 42 minutes in preparing to see the patient by review of recent labs, imaging and procedures, obtaining and reviewing separately obtained history, communicating with the patient and family or caregiver, ordering medications, tests or procedures, and documenting clinical information in the EHR including the differential Dx, treatment, and any further evaluation and other management of polyarhtralgia, thrush, copd, nausea, dm

## 2020-02-20 NOTE — Patient Instructions (Signed)
You had the steroid shot today (depomedrol)  Please take all new medication as prescribed - the diflucan, nystatin suspension, celebrex, colchicine, and zofran as prescribed  Please continue all other medications as before, and refills have been done if requested.  Please have the pharmacy call with any other refills you may need.  Please continue your efforts at being more active, low cholesterol diet, and weight control.  Please keep your appointments with your specialists as you may have planned - the second covid shot may 7

## 2020-02-20 NOTE — Assessment & Plan Note (Signed)
Marked tongue - for diflucan oral and nystatin susp asd,  to f/u any worsening symptoms or concerns

## 2020-02-20 NOTE — Progress Notes (Signed)
Subjective:    Patient ID: Nicole Melendez, female    DOB: 1975-11-07, 44 y.o.   MRN: [id number]  HPI  Here with c/o sudden worsening polyarthralgia with sudden stopping prednisone rx per dr Tamala Julian as she is trying to avoid prednisone in order to complete her covid vaccinations, has had #1 shot, due for #2 on may 7.  Now with much worsening pain and subjective swelling to feet, ankles, knees, neck and shoulders.  ALso with worsening thrush with steroid, hard to swallow.  Has oxycodone per pain management.  Pt denies chest pain, increased sob or doe, wheezing, orthopnea, PND, increased LE swelling, palpitations, dizziness or syncope.  Pt denies polydipsia, polyuria Past Medical History:  Diagnosis Date  . Allergic rhinitis, cause unspecified   . Anemia   . Anxiety   . Depression   . Diabetes mellitus type II    steroid related, patient states "im no longer diabetic"  . Essential hypertension 08/08/2015  . GERD (gastroesophageal reflux disease)   . History of blood transfusion   . Hyperlipidemia   . Menorrhagia   . Migraine   . Morbid obesity ([place])   . Otitis media 06/28/2015  . Peptic ulcer disease   . Positive ANA (antinuclear antibody) 02/14/2012  . Sarcoid    including hand per rheumatology-Dr. Amil Amen  . Sarcoidosis of lung ([place])   . Shortness of breath    on exertion  . Varicose veins with pain    Past Surgical History:  Procedure Laterality Date  . ABDOMINAL HYSTERECTOMY    . COLONOSCOPY WITH PROPOFOL N/A 10/19/2016   Procedure: COLONOSCOPY WITH PROPOFOL;  Surgeon: Mauri Pole, MD;  [facility]: [facility];  Service: Endoscopy;  Laterality: N/A;  . ESOPHAGOGASTRODUODENOSCOPY (EGD) WITH PROPOFOL N/A 10/19/2016   Procedure: ESOPHAGOGASTRODUODENOSCOPY (EGD) WITH PROPOFOL;  Surgeon: Mauri Pole, MD;  Location: [facility];  Service: Endoscopy;  Laterality: N/A;  . HERNIA MESH REMOVAL  02/2013  . uterine ablation  03/2010  . WISDOM TOOTH EXTRACTION      reports  that she quit smoking about 7 years ago. Her smoking use included cigarettes. She has a 20.00 pack-year smoking history. She has never used smokeless tobacco. She reports that she does not drink alcohol or use drugs. family history includes Allergies in her mother; Bone cancer in her maternal aunt; Breast cancer in her maternal aunt; Diabetes in her father and mother; Heart attack in her mother; Heart disease in her father; Hypertension in an other family member; Lung cancer in her maternal aunt; Ovarian cancer in her maternal aunt; Rheum arthritis in her father; Stroke in her father and maternal uncle. Allergies  Allergen Reactions  . Azithromycin Hives    (z pak) hives  . Bee Venom Anaphylaxis  . Flagyl [Metronidazole] Hives and Shortness Of Breath  . Penicillins Shortness Of Breath and Swelling    Has patient had a PCN reaction causing immediate rash, facial/tongue/throat swelling, SOB or lightheadedness with hypotension: Yes Has patient had a PCN reaction causing severe rash involving mucus membranes or skin necrosis: No Has patient had a PCN reaction that required hospitalization No Has patient had a PCN reaction occurring within the last 10 years: No If all of the above answers are "NO", then may proceed with Cephalosporin use.  REACTION: swelling and difficulty breathing  . Shellfish Allergy Anaphylaxis  . Klonopin [Clonazepam] Other (See Comments)    Memory difficulty   Current Outpatient Medications on File Prior to Visit  Medication Sig Dispense Refill  .  acetaminophen (TYLENOL) 650 MG CR tablet Take 650 mg by mouth every 8 (eight) hours as needed for pain.    Marland Kitchen albuterol (PROVENTIL HFA;VENTOLIN HFA) 108 (90 Base) MCG/ACT inhaler Inhale 2 puffs into the lungs every 4 (four) hours as needed for wheezing or shortness of breath. 1 Inhaler 2  . ALPRAZolam (XANAX) 0.5 MG tablet TAKE 1 TABLET(0.5 MG) BY MOUTH THREE TIMES DAILY AS NEEDED 90 tablet 2  . azelastine (ASTELIN) 0.1 % nasal  spray Place 2 sprays into both nostrils 2 (two) times daily. Use in each nostril as directed 30 mL 12  . Blood Glucose Monitoring Suppl (ONETOUCH VERIO) w/Device KIT Use as directed daily E11.9 1 kit 0  . cetirizine (ZYRTEC) 10 MG tablet TAKE 1 TABLET(10 MG) BY MOUTH DAILY 90 tablet 1  . cyclobenzaprine (FLEXERIL) 5 MG tablet Take 1 tablet (5 mg total) by mouth 3 (three) times daily as needed for muscle spasms. 40 tablet 1  . Diclofenac Sodium (PENNSAID) 2 % SOLN Apply 1 pump twice daily as needed. (Patient taking differently: Apply 1 Pump topically 2 (two) times daily. Apply 1 pump twice daily) 112 g 3  . fluticasone (FLONASE) 50 MCG/ACT nasal spray Place 2 sprays into both nostrils daily. 16 g 0  . furosemide (LASIX) 40 MG tablet 1 tab by mouth every day in the AM and then 1 as needed only for persistent swelling in the PM (Patient taking differently: Take 20 mg by mouth daily as needed for fluid. ) 60 tablet 11  . gabapentin (NEURONTIN) 300 MG capsule TAKE 2 CAPSULES(600 MG) BY MOUTH THREE TIMES DAILY 270 capsule 5  . glucose blood (ONETOUCH VERIO) test strip Use as instructed three times daily E11.9 100 each 12  . Insulin Lispro (HUMALOG KWIKPEN) 200 UNIT/ML SOPN Inject 12 Units into the skin 4 (four) times daily. WITH MEALS W/ SLIDING SCALE ASD E11.9 5 pen 11  . Insulin Pen Needle (BD PEN NEEDLE NANO U/F) 32G X 4 MM MISC Use as directed three times daily E11.9 300 each 3  . levalbuterol (XOPENEX HFA) 45 MCG/ACT inhaler Inhale 1-2 puffs into the lungs every 8 (eight) hours as needed for wheezing or shortness of breath. USE 2 PUFFS 3 TIMES DAILY X 5 DAYS THEN BACK TO HOME REGIMEN AS NEEDED. 1 Inhaler 0  . mometasone-formoterol (DULERA) 200-5 MCG/ACT AERO Inhale 2 puffs into the lungs 2 (two) times a day. 8.8 g 5  . omeprazole (PRILOSEC) 40 MG capsule TAKE 1 CAPSULE(40 MG) BY MOUTH DAILY 90 capsule 3  . oxyCODONE-acetaminophen (PERCOCET) 10-325 MG tablet Take 1 tablet by mouth every 8 (eight) hours  as needed for pain.   0  . predniSONE (DELTASONE) 50 MG tablet Take one tablet daily for the next 5 days. 5 tablet 0  . rizatriptan (MAXALT-MLT) 10 MG disintegrating tablet Take 1 tablet earliest onset of migraine.  May repeat in 2 hours if needed.  Maximum 2 tablets in 24 hours (Patient taking differently: Take 10 mg by mouth every 2 (two) hours as needed for migraine. Take 1 tablet earliest onset of migraine.  May repeat in 2 hours if needed.  Maximum 2 tablets in 24 hours) 9 tablet 3  . rosuvastatin (CRESTOR) 40 MG tablet TAKE 1 TABLET(40 MG) BY MOUTH DAILY 90 tablet 2  . sertraline (ZOLOFT) 100 MG tablet TAKE 2 TABLETS(200 MG) BY MOUTH DAILY 180 tablet 3  . SYMBICORT 160-4.5 MCG/ACT inhaler Inhale 2 puffs into the lungs 2 (two) times daily.    Marland Kitchen  telmisartan (MICARDIS) 40 MG tablet TAKE 1 TABLET(40 MG) BY MOUTH DAILY 90 tablet 3  . TURMERIC PO Take 1 tablet by mouth daily.    . vitamin B-12 (CYANOCOBALAMIN) 1000 MCG tablet Take 1,000 mcg by mouth daily.    . Vitamin D, Ergocalciferol, (DRISDOL) 1.25 MG (50000 UNIT) CAPS capsule Take 1 capsule (50,000 Units total) by mouth every 7 (seven) days. 12 capsule 0  . zonisamide (ZONEGRAN) 100 MG capsule TAKE 2 CAPSULES(200 MG) BY MOUTH DAILY 60 capsule 5   No current facility-administered medications on file prior to visit.   Review of Systems All otherwise neg per pt     Objective:   Physical Exam BP 130/72 (BP Location: Left Arm, Patient Position: Sitting, Cuff Size: Large)   Pulse (!) 102   Temp 98.3 F (36.8 C) (Oral)   Ht _0  (1.702 m)   Wt (!) 337 lb (152.9 kg)   LMP 07/17/2012   SpO2 98%   BMI 52.78 kg/m  VS noted,  Constitutional: Pt appears in NAD HENT: Head: NCAT.  Right Ear: External ear normal.  Left Ear: External ear normal.  Eyes: . Pupils are equal, round, and reactive to light. Conjunctivae and EOM are normal Nose: without d/c or deformity Neck: Neck supple. Gross normal ROM Cardiovascular: Normal rate and regular  rhythm.   Pulmonary/Chest: Effort normal and breath sounds without rales or wheezing.  Abd:  Soft, NT, ND, + BS, no organomegaly Neurological: Pt is alert. At baseline orientation, motor grossly intact Skin: Skin is warm. No rashes, other new lesions, no LE edema Psychiatric: Pt behavior is normal without agitation  All otherwise neg per pt Lab Results  Component Value Date   WBC 9.6 11/20/2019   HGB 12.9 11/20/2019   HCT 39.8 11/20/2019   PLT 326 11/20/2019   GLUCOSE 157 (H) 01/24/2020   CHOL 145 01/24/2020   TRIG 130.0 01/24/2020   HDL 47.20 01/24/2020   LDLDIRECT 135.0 07/05/2011   LDLCALC 72 01/24/2020   ALT 11 01/24/2020   AST 14 01/24/2020   NA 138 01/24/2020   K 3.7 01/24/2020   CL 106 01/24/2020   CREATININE 0.70 01/24/2020   BUN 8 01/24/2020   CO2 24 01/24/2020   TSH 1.60 07/27/2019   INR 1.3 11/17/2012   HGBA1C 6.3 01/24/2020   MICROALBUR <0.7 07/27/2019      Assessment & Plan:

## 2020-02-20 NOTE — Assessment & Plan Note (Signed)
Mild recurrent, for zofran prn

## 2020-02-25 ENCOUNTER — Encounter: Payer: Self-pay | Admitting: Internal Medicine

## 2020-02-25 ENCOUNTER — Telehealth: Payer: Self-pay | Admitting: Internal Medicine

## 2020-02-25 ENCOUNTER — Encounter: Payer: Self-pay | Admitting: Family Medicine

## 2020-02-25 NOTE — Telephone Encounter (Signed)
New message:   Pt states she is still having pain in her right leg and she is having to use her cane. Pt left a message on mychart as well. Please advise.

## 2020-02-26 ENCOUNTER — Other Ambulatory Visit: Payer: Self-pay

## 2020-02-26 ENCOUNTER — Encounter: Payer: Self-pay | Admitting: Family Medicine

## 2020-02-26 ENCOUNTER — Ambulatory Visit: Payer: BC Managed Care – PPO | Admitting: Family Medicine

## 2020-02-26 VITALS — BP 126/96 | HR 91 | Ht 67.0 in | Wt 336.0 lb

## 2020-02-26 DIAGNOSIS — M5417 Radiculopathy, lumbosacral region: Secondary | ICD-10-CM

## 2020-02-26 NOTE — Progress Notes (Signed)
I, [name], LAT, ATC, am serving as scribe for Dr. Lynne Leader.  Nicole Melendez is a 44 y.o. female who presents to [facility] today for low back pain and R leg pain and weakness.  She was last seen by Dr. Tamala Julian for her R ankle on 02/08/20.  Since her last visit w/ Dr. Tamala Julian, pt has fallen 3 times in past week due to right leg weakness. Had same issue last July. Gradually been getting worse over past few weeks, R>L, constant pain. Has to ambulate using cane due to instability. Using Percocet, gabapentin, and mm relaxer for pain. Seeing pain management next week.  [facility] [employer].  Pertinent review of systems: No fevers or chills  Relevant historical information: History previous facet ablations   Exam:  BP (!) 126/96   Pulse 91   Ht 5\' 7"  (1.702 m)   Wt (!) 336 lb (152.4 kg)   LMP 07/17/2012   SpO2 97%   BMI 52.63 kg/m  General: Well Developed, well nourished, and in no acute distress.   MSK: Nontender lumbar spine. Lower extremity sensation is intact. Strength is intact with exception of foot and great toe dorsiflexion right leg slightly diminished 4+/5. Positive right-sided slump test. Mild antalgic gait.    Lab and Radiology Results EXAM: MRI LUMBAR SPINE WITHOUT CONTRAST  TECHNIQUE: Multiplanar, multisequence MR imaging of the lumbar spine was performed. No intravenous contrast was administered.  COMPARISON:  MRI lumbar spine dated May 17, 2018.  FINDINGS: Segmentation:  Unchanged partial lumbarization of S1.  Alignment:  Unchanged 4 mm anterolisthesis at L5-S1.  Vertebrae:  No fracture, evidence of discitis, or bone lesion.  Conus medullaris and cauda equina: Conus extends to the L1-L2 level. Conus and cauda equina appear normal.  Paraspinal and other soft tissues: Negative.  Disc levels:  T12-L1:  Negative.  L1-L2:  Negative.  L2-L3:  Negative.  L3-L4:  Negative disc. Moderate bilateral facet arthropathy. No stenosis.  L4-L5: Unchanged disc desiccation and minimal disc bulging. Unchanged severe bilateral facet arthropathy. Unchanged mild bilateral neuroforaminal stenosis. No spinal canal stenosis.  L5-S1: Unchanged disc uncovering and mild disc bulging. Unchanged severe bilateral facet arthropathy. Unchanged moderate bilateral neuroforaminal stenosis. No spinal canal stenosis.  IMPRESSION: 1. Unchanged lower lumbar degenerative disc disease and advanced facet arthropathy. Unchanged moderate bilateral neuroforaminal stenosis at L5-S1.   Electronically Signed   By: Titus Dubin M.D.   On: 06/09/2019 12:06 I, Lynne Leader, personally (independently) visualized and performed the interpretation of the images attached in this note.     Assessment and Plan: 44 y.o. female with right leg pain.  Pain rating down right leg consistent with an S1 dermatome.  She does have weakness however with foot dorsiflexion which is more consistent with L4-L5.  She does not have pain in this distribution.  She has an MRI from August 2020 that does show some neuroforaminal stenosis at L5-S1 level which would be consistent with S1 radiculopathy.  She notes her symptoms have been ongoing for about a year.  I think it is reasonable to attempt epidural steroid injection at this level.  If not improved then would proceed with repeat MRI.  Discussed plan with patient expresses understanding and agreement.    Orders Placed This Encounter  Procedures  . DG INJECT DIAG/THERA/INC NEEDLE/CATH/PLC EPI/LUMB/SAC W/IMG    Standing Status:   Future    Standing Expiration Date:   04/27/2021  Order Specific Question:   Reason for Exam (SYMPTOM  OR DIAGNOSIS REQUIRED)    Answer:   Right L5-S1 transforaminal interlaminar per radiologist discretion to address right S1 radiculopathy    Order Specific Question:   Is the patient pregnant?    Answer:   No    Order  Specific Question:   Preferred Imaging Location?    Answer:   [id number]    Order Specific Question:   Radiology Contrast Protocol - do NOT remove file path    Answer:   [email or web address]   No orders of the defined types were placed in this encounter.    Discussed warning signs or symptoms. Please see discharge instructions. Patient expresses understanding.   The above documentation has been reviewed and is accurate and complete Lynne Leader

## 2020-02-26 NOTE — Patient Instructions (Addendum)
Thank you for coming in today.  Try using a walker.  We will order an epidural  Recheck after back injection.

## 2020-03-03 ENCOUNTER — Encounter: Payer: Self-pay | Admitting: Family Medicine

## 2020-03-03 ENCOUNTER — Telehealth: Payer: Self-pay | Admitting: Family Medicine

## 2020-03-03 NOTE — Telephone Encounter (Signed)
Rec'd from [facility] forwarded 177 pages to Dr. Hulan Saas [name]

## 2020-03-04 ENCOUNTER — Inpatient Hospital Stay: Admission: RE | Admit: 2020-03-04 | Payer: BC Managed Care – PPO | Source: Ambulatory Visit

## 2020-03-04 DIAGNOSIS — G894 Chronic pain syndrome: Secondary | ICD-10-CM | POA: Diagnosis not present

## 2020-03-04 DIAGNOSIS — M542 Cervicalgia: Secondary | ICD-10-CM | POA: Diagnosis not present

## 2020-03-04 DIAGNOSIS — M47817 Spondylosis without myelopathy or radiculopathy, lumbosacral region: Secondary | ICD-10-CM | POA: Diagnosis not present

## 2020-03-04 DIAGNOSIS — M545 Low back pain: Secondary | ICD-10-CM | POA: Diagnosis not present

## 2020-03-05 ENCOUNTER — Telehealth: Payer: Self-pay | Admitting: Family Medicine

## 2020-03-05 NOTE — Telephone Encounter (Signed)
Completed form for work.

## 2020-03-07 ENCOUNTER — Other Ambulatory Visit: Payer: Self-pay

## 2020-03-07 ENCOUNTER — Encounter: Payer: BC Managed Care – PPO | Attending: Internal Medicine | Admitting: Dietician

## 2020-03-07 ENCOUNTER — Encounter: Payer: Self-pay | Admitting: Dietician

## 2020-03-07 DIAGNOSIS — E1140 Type 2 diabetes mellitus with diabetic neuropathy, unspecified: Secondary | ICD-10-CM | POA: Diagnosis not present

## 2020-03-07 DIAGNOSIS — Z794 Long term (current) use of insulin: Secondary | ICD-10-CM | POA: Diagnosis not present

## 2020-03-07 NOTE — Progress Notes (Signed)
Medical Nutrition Therapy:  Appt start time: 0905 end time:  1030   Assessment:  Primary concerns today: .  Patient is here today alone.  She would like to lose weight.  She is considering weight loss surgery.  She states that she had a hernia surgery 2014 "which was more like a gastric sleeve".  History includes:  Type 2 Diabetes, neuropathy, HTN, HLD, GERD, COPD She fell in December, broke her foot and tore tendons.  She now has sciatica and is under treatment for this. A1C 6.3% 01/24/20 decreased from 6.7% 07/27/2019.  Medications include:  Humalog 12 units 4 times per day.  She only takes this when her blood sugar is high before her meal and checks her blood sugar 2 hours after she eats and will take more insulin if her blood sugar is high. Reports recent steroids with increased blood sugar.  Weight hx:   330 lbs 02/06/2020 335 lbs 01/24/2020 425 lbs reduced to 375 lbs after hysterectomy 2012 Had hernia surgery 2014 "like a gastric sleeve" and lost to 240 lbs 425 lbs highest adult weight 220 lbs 2011 when diagnosed to sarcoidosis.  Diagnosed with diabetes at that time due to high doses of steroids.  Patient lives with her nephew due to her frequent falls.  Patient does her own cooking and her nephew does the shopping or she orders on line.  She meal preps on Sunday. 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   She is currently on short term disability since her fall. She is currently doing collections.  She retired from [employer] due to Sarcoidosis.    Preferred Learning Style:   No preference indicated   Learning Readiness:   Ready  Change in progress   DIETARY INTAKE:  Avoided foods include rice, red meat.  Eliminated much of her dairy.  24-hr recall:  B (7:30-9 AM): Cream of wheat, almond milk, 1 tsp honey, fried egg  Snk ( AM): none  L ( PM): greek yogurt mixed with Oui whole milk yogurt OR [employer] vegetable plate (cabbage, corn, turnip greens, potatoes, sometimes with  salisbury steak) Snk ( PM): baked chips (part of a small bag) OR Tostitos AND grapes or fresh pineapple D (5:30 PM): spaghetti with [place], vegetable jumbalaya, cauliflower rice OR [place] sausage, potatoes, peppers OR [place] chops, green beans, corn, fried apples Snk (6-7 PM): tangerine Beverages: 32 oz water daily, 12 oz cranberry juice, occasional decaf honey sweet tea (1T tea per 16 oz)  Usual physical activity: was using an exercise bike for 3 miles or treadmill for 20 minutes 3 days per week prior to the fall. Currently cleans her home and exercise is limited to this.  Progress Towards Goal(s):  In progress.   Nutritional Diagnosis:  NB-1.1 Food and nutrition-related knowledge deficit As related to balance of carbohydrate, protein, adn fat.  As evidenced by diet hx and patient report.    Intervention:  Nutrition education/counseling related to mindful eating.  Discussed meal consistency and balance to include protein and carbohydrate.  Discussed beverages and effect on blood glucose.   Se is considering bariatric surgery but would prefer to lose weight with lifestyle if possible.  Plan: For weight loss surgery information consider attending a class.  Times at the following web site or call for times. [email or web address] OR call [phone]  Choose beverages that do not have carbohydrates most often. Breakfast, lunch, and dinner daily Each meal should have some protein. Small snack if you are hungry  1/2  cup beans, salsa, baked tortilla chips Drink more water  Find ways to add more vegetables Recommend changing your yogurt to greek Continue to eat mindfully  Teaching Method Utilized:  Visual Auditory Hands on  Handouts given during visit include:  Meal plan card  Label reading  Barriers to learning/adherence to lifestyle change: none  Demonstrated degree of understanding via:  Teach Back   Monitoring/Evaluation:  Dietary intake,  exercise, label reading, and body weight in 1 month(s).

## 2020-03-07 NOTE — Patient Instructions (Addendum)
For weight loss surgery information consider attending a class.  Times at the following web site or call for times. [email or web address] OR call [phone]  Choose beverages that do not have carbohydrates most often. Breakfast, lunch, and dinner daily Each meal should have some protein. Small snack if you are hungry  1/2 cup beans, salsa, baked tortilla chips Drink more water  Find ways to add more vegetables Recommend changing your yogurt to greek  Continue to eat mindfully

## 2020-03-10 ENCOUNTER — Ambulatory Visit: Payer: BC Managed Care – PPO | Admitting: Family Medicine

## 2020-03-10 DIAGNOSIS — Z0289 Encounter for other administrative examinations: Secondary | ICD-10-CM

## 2020-03-10 NOTE — Progress Notes (Deleted)
[place] Phone: [phone] Subjective:    I'm seeing this patient by the request  of:  Biagio Borg, MD  CC:   [id number]  Nicole Melendez is a 44 y.o. female coming in with complaint of ***  Onset-  Location Duration-  Character- Aggravating factors- Reliving factors-  Therapies tried-  Severity-     Past Medical History:  Diagnosis Date  . Allergic rhinitis, cause unspecified   . Anemia   . Anxiety   . Depression   . Diabetes mellitus type II    steroid related, patient states "im no longer diabetic"  . Essential hypertension 08/08/2015  . GERD (gastroesophageal reflux disease)   . History of blood transfusion   . Hyperlipidemia   . Menorrhagia   . Migraine   . Morbid obesity ([place])   . Otitis media 06/28/2015  . Peptic ulcer disease   . Positive ANA (antinuclear antibody) 02/14/2012  . Sarcoid    including hand per rheumatology-Dr. Amil Amen  . Sarcoidosis of lung ([place])   . Shortness of breath    on exertion  . Varicose veins with pain    Past Surgical History:  Procedure Laterality Date  . ABDOMINAL HYSTERECTOMY    . COLONOSCOPY WITH PROPOFOL N/A 10/19/2016   Procedure: COLONOSCOPY WITH PROPOFOL;  Surgeon: Mauri Pole, MD;  Location: [facility];  Service: Endoscopy;  Laterality: N/A;  . ESOPHAGOGASTRODUODENOSCOPY (EGD) WITH PROPOFOL N/A 10/19/2016   Procedure: ESOPHAGOGASTRODUODENOSCOPY (EGD) WITH PROPOFOL;  Surgeon: Mauri Pole, MD;  Location: [facility];  Service: Endoscopy;  Laterality: N/A;  . HERNIA MESH REMOVAL  02/2013  . uterine ablation  03/2010  . WISDOM TOOTH EXTRACTION     Social History   Socioeconomic History  . Marital status: Single    Spouse name: Not on file  . Number of children: 0  . Years of education: Not on file  . Highest education level: Master's degree (e.g., MA, MS, MEng, MEd, MSW, MBA)  Occupational History  . Occupation:  personal shopper  Tobacco Use  . Smoking status: Former Smoker    Packs/day: 1.00    Years: 20.00    Pack years: 20.00    Types: Cigarettes    Quit date: 10/25/2012    Years since quitting: 7.3  . Smokeless tobacco: Never Used  . Tobacco comment: QUIT 04/2010 AND STARTED BACK 2014 X 3 MONTHS. less than 1 ppd.  started at age 45.    Substance and Sexual Activity  . Alcohol use: No  . Drug use: No  . Sexual activity: Yes    Birth control/protection: None  Other Topics Concern  . Not on file  Social History Narrative   Pt lives with Nicole Melendez- also pt of [place]      Has 2 bachelors/1 masters; is working; R handed - one level home; R handed; rare caffeine;       Social Determinants of Health   Financial Resource Strain:   . Difficulty of Paying Living Expenses:   Food Insecurity:   . Worried About Charity fundraiser in the Last Year:   . Arboriculturist in the Last Year:   Transportation Needs:   . Film/video editor (Medical):   Marland Kitchen Lack of Transportation (Non-Medical):   Physical Activity:   . Days of Exercise per Week:   . Minutes of Exercise per Session:   Stress:   . Feeling of Stress :  Social Connections:   . Frequency of Communication with Friends and Family:   . Frequency of Social Gatherings with Friends and Family:   . Attends Religious Services:   . Active Member of Clubs or Organizations:   . Attends Archivist Meetings:   Marland Kitchen Marital Status:    Allergies  Allergen Reactions  . Azithromycin Hives    (z pak) hives  . Bee Venom Anaphylaxis  . Flagyl [Metronidazole] Hives and Shortness Of Breath  . Penicillins Shortness Of Breath and Swelling    Has patient had a PCN reaction causing immediate rash, facial/tongue/throat swelling, SOB or lightheadedness with hypotension: Yes Has patient had a PCN reaction causing severe rash involving mucus membranes or skin necrosis: No Has patient had a PCN reaction that required hospitalization No Has patient  had a PCN reaction occurring within the last 10 years: No If all of the above answers are "NO", then may proceed with Cephalosporin use.  REACTION: swelling and difficulty breathing  . Shellfish Allergy Anaphylaxis  . Klonopin [Clonazepam] Other (See Comments)    Memory difficulty   Family History  Problem Relation Age of Onset  . Allergies Mother   . Heart attack Mother   . Diabetes Mother   . Diabetes Father   . Heart disease Father   . Rheum arthritis Father   . Stroke Father   . Hypertension Other   . Ovarian cancer Maternal Aunt   . Lung cancer Maternal Aunt   . Breast cancer Maternal Aunt   . Bone cancer Maternal Aunt   . Stroke Maternal Uncle   . Other Neg Hx     Current Outpatient Medications (Endocrine & Metabolic):  Marland Kitchen  Insulin Lispro (HUMALOG KWIKPEN) 200 UNIT/ML SOPN, Inject 12 Units into the skin 4 (four) times daily. WITH MEALS W/ SLIDING SCALE ASD E11.9 .  predniSONE (DELTASONE) 50 MG tablet, Take one tablet daily for the next 5 days. (Patient not taking: Reported on 03/07/2020)  Current Outpatient Medications (Cardiovascular):  .  furosemide (LASIX) 40 MG tablet, 1 tab by mouth every day in the AM and then 1 as needed only for persistent swelling in the PM (Patient not taking: Reported on 03/07/2020) .  rosuvastatin (CRESTOR) 40 MG tablet, TAKE 1 TABLET(40 MG) BY MOUTH DAILY .  telmisartan (MICARDIS) 40 MG tablet, TAKE 1 TABLET(40 MG) BY MOUTH DAILY .  telmisartan (MICARDIS) 40 MG tablet, Take 40 mg by mouth daily.  Current Outpatient Medications (Respiratory):  .  albuterol (PROVENTIL HFA;VENTOLIN HFA) 108 (90 Base) MCG/ACT inhaler, Inhale 2 puffs into the lungs every 4 (four) hours as needed for wheezing or shortness of breath. Marland Kitchen  azelastine (ASTELIN) 0.1 % nasal spray, Place 2 sprays into both nostrils 2 (two) times daily. Use in each nostril as directed .  cetirizine (ZYRTEC) 10 MG tablet, TAKE 1 TABLET(10 MG) BY MOUTH DAILY .  fluticasone (FLONASE) 50 MCG/ACT  nasal spray, Place 2 sprays into both nostrils daily. Marland Kitchen  levalbuterol (XOPENEX HFA) 45 MCG/ACT inhaler, Inhale 1-2 puffs into the lungs every 8 (eight) hours as needed for wheezing or shortness of breath. USE 2 PUFFS 3 TIMES DAILY X 5 DAYS THEN BACK TO HOME REGIMEN AS NEEDED. Marland Kitchen  mometasone-formoterol (DULERA) 200-5 MCG/ACT AERO, Inhale 2 puffs into the lungs 2 (two) times a day. .  SYMBICORT 160-4.5 MCG/ACT inhaler, Inhale 2 puffs into the lungs 2 (two) times daily.  Current Outpatient Medications (Analgesics):  .  acetaminophen (TYLENOL) 650 MG CR tablet, Take 650  mg by mouth every 8 (eight) hours as needed for pain. .  celecoxib (CELEBREX) 200 MG capsule, Take 1 capsule (200 mg total) by mouth 2 (two) times daily as needed. .  colchicine 0.6 MG tablet, Take 1 tablet (0.6 mg total) by mouth daily. Marland Kitchen  oxyCODONE-acetaminophen (PERCOCET) 10-325 MG tablet, Take 1 tablet by mouth every 8 (eight) hours as needed for pain.  .  rizatriptan (MAXALT-MLT) 10 MG disintegrating tablet, Take 1 tablet earliest onset of migraine.  May repeat in 2 hours if needed.  Maximum 2 tablets in 24 hours (Patient taking differently: Take 10 mg by mouth every 2 (two) hours as needed for migraine. Take 1 tablet earliest onset of migraine.  May repeat in 2 hours if needed.  Maximum 2 tablets in 24 hours)  Current Outpatient Medications (Hematological):  .  vitamin B-12 (CYANOCOBALAMIN) 1000 MCG tablet, Take 1,000 mcg by mouth daily.  Current Outpatient Medications (Other):  Marland Kitchen  ALPRAZolam (XANAX) 0.5 MG tablet, TAKE 1 TABLET(0.5 MG) BY MOUTH THREE TIMES DAILY AS NEEDED .  Blood Glucose Monitoring Suppl (ONETOUCH VERIO) w/Device KIT, Use as directed daily E11.9 .  cyclobenzaprine (FLEXERIL) 5 MG tablet, Take 1 tablet (5 mg total) by mouth 3 (three) times daily as needed for muscle spasms. .  Diclofenac Sodium (PENNSAID) 2 % SOLN, Apply 1 pump twice daily as needed. (Patient taking differently: Apply 1 Pump topically 2 (two)  times daily. Apply 1 pump twice daily) .  fluconazole (DIFLUCAN) 100 MG tablet, Take 1 tablet (100 mg total) by mouth daily. (Patient not taking: Reported on 03/07/2020) .  gabapentin (NEURONTIN) 300 MG capsule, TAKE 2 CAPSULES(600 MG) BY MOUTH THREE TIMES DAILY .  glucose blood (ONETOUCH VERIO) test strip, Use as instructed three times daily E11.9 .  Insulin Pen Needle (BD PEN NEEDLE NANO U/F) 32G X 4 MM MISC, Use as directed three times daily E11.9 .  omeprazole (PRILOSEC) 40 MG capsule, TAKE 1 CAPSULE(40 MG) BY MOUTH DAILY .  ondansetron (ZOFRAN ODT) 4 MG disintegrating tablet, Take 1 tablet (4 mg total) by mouth every 8 (eight) hours as needed for nausea or vomiting. .  sertraline (ZOLOFT) 100 MG tablet, TAKE 2 TABLETS(200 MG) BY MOUTH DAILY .  TURMERIC PO, Take 1 tablet by mouth daily. .  Vitamin D, Ergocalciferol, (DRISDOL) 1.25 MG (50000 UNIT) CAPS capsule, Take 1 capsule (50,000 Units total) by mouth every 7 (seven) days. Marland Kitchen  zonisamide (ZONEGRAN) 100 MG capsule, TAKE 2 CAPSULES(200 MG) BY MOUTH DAILY   Reviewed prior external information including notes and imaging from  primary care provider As well as notes that were available from care everywhere and other healthcare systems.  Past medical history, social, surgical and family history all reviewed in electronic medical record.  No pertanent information unless stated regarding to the chief complaint.   Review of Systems:  No headache, visual changes, nausea, vomiting, diarrhea, constipation, dizziness, abdominal pain, skin rash, fevers, chills, night sweats, weight loss, swollen lymph nodes, body aches, joint swelling, chest pain, shortness of breath, mood changes. POSITIVE muscle aches  Objective  Last menstrual period 07/17/2012.   General: No apparent distress alert and oriented x3 mood and affect normal, dressed appropriately.  HEENT: Pupils equal, extraocular movements intact  Respiratory: Patient's speak in full sentences and  does not appear short of breath  Cardiovascular: No lower extremity edema, non tender, no erythema  Neuro: Cranial nerves II through XII are intact, neurovascularly intact in all extremities with 2+ DTRs and 2+  pulses.  Gait normal with good balance and coordination.  MSK:  Non tender with full range of motion and good stability and symmetric strength and tone of shoulders, elbows, wrist, hip, knee and ankles bilaterally.     Impression and Recommendations:     This case required medical decision making of moderate complexity. The above documentation has been reviewed and is accurate and complete Lyndal Pulley, DO       Note: This dictation was prepared with Dragon dictation along with smaller phrase technology. Any transcriptional errors that result from this process are unintentional.

## 2020-03-12 DIAGNOSIS — M5417 Radiculopathy, lumbosacral region: Secondary | ICD-10-CM | POA: Diagnosis not present

## 2020-03-12 DIAGNOSIS — M47817 Spondylosis without myelopathy or radiculopathy, lumbosacral region: Secondary | ICD-10-CM | POA: Diagnosis not present

## 2020-03-12 DIAGNOSIS — M5136 Other intervertebral disc degeneration, lumbar region: Secondary | ICD-10-CM | POA: Diagnosis not present

## 2020-03-14 ENCOUNTER — Other Ambulatory Visit: Payer: Self-pay | Admitting: Internal Medicine

## 2020-03-14 NOTE — Telephone Encounter (Signed)
Done erx 

## 2020-03-19 ENCOUNTER — Telehealth: Payer: Self-pay | Admitting: Neurology

## 2020-03-19 NOTE — Telephone Encounter (Signed)
Pt states she's had this headache since last week after getting her epidural injections in her back. Pt states she tried to go to work but had to leave early due to the blurred vision.   Please advise if she could come in the morning to get a headache cocktail? Or something to help?

## 2020-03-19 NOTE — Telephone Encounter (Addendum)
Patient called requesting an appointment as soon as possible for a "cluster headache." She said she has taken 2 doses of her rescue medication without relief.

## 2020-03-19 NOTE — Telephone Encounter (Signed)
LMOVM, Advising  Message sent to provider. He is out of the office right now and will return on 03/20/20

## 2020-03-19 NOTE — Telephone Encounter (Signed)
[place] for migraine cocktail similar to what she got in January, thanks. Needs a driver

## 2020-03-20 ENCOUNTER — Ambulatory Visit (INDEPENDENT_AMBULATORY_CARE_PROVIDER_SITE_OTHER): Payer: BC Managed Care – PPO

## 2020-03-20 ENCOUNTER — Other Ambulatory Visit: Payer: Self-pay

## 2020-03-20 DIAGNOSIS — G43009 Migraine without aura, not intractable, without status migrainosus: Secondary | ICD-10-CM | POA: Diagnosis not present

## 2020-03-20 MED ORDER — METOCLOPRAMIDE HCL 5 MG/ML IJ SOLN
10.0000 mg | Freq: Once | INTRAMUSCULAR | Status: AC
  Administered 2020-03-20: 10 mg via INTRAMUSCULAR

## 2020-03-20 MED ORDER — DIPHENHYDRAMINE HCL 50 MG/ML IJ SOLN
25.0000 mg | Freq: Once | INTRAMUSCULAR | Status: AC
  Administered 2020-03-20: 25 mg via INTRAMUSCULAR

## 2020-03-20 MED ORDER — KETOROLAC TROMETHAMINE 60 MG/2ML IM SOLN
60.0000 mg | Freq: Once | INTRAMUSCULAR | Status: AC
  Administered 2020-03-20: 60 mg via INTRAMUSCULAR

## 2020-03-31 ENCOUNTER — Other Ambulatory Visit: Payer: Self-pay | Admitting: Internal Medicine

## 2020-03-31 ENCOUNTER — Other Ambulatory Visit: Payer: Self-pay | Admitting: Emergency Medicine

## 2020-03-31 DIAGNOSIS — D869 Sarcoidosis, unspecified: Secondary | ICD-10-CM

## 2020-04-01 DIAGNOSIS — M069 Rheumatoid arthritis, unspecified: Secondary | ICD-10-CM | POA: Diagnosis not present

## 2020-04-01 DIAGNOSIS — M47817 Spondylosis without myelopathy or radiculopathy, lumbosacral region: Secondary | ICD-10-CM | POA: Diagnosis not present

## 2020-04-01 DIAGNOSIS — Z79899 Other long term (current) drug therapy: Secondary | ICD-10-CM | POA: Diagnosis not present

## 2020-04-01 DIAGNOSIS — G894 Chronic pain syndrome: Secondary | ICD-10-CM | POA: Diagnosis not present

## 2020-04-01 DIAGNOSIS — M79606 Pain in leg, unspecified: Secondary | ICD-10-CM | POA: Diagnosis not present

## 2020-04-01 DIAGNOSIS — Z79891 Long term (current) use of opiate analgesic: Secondary | ICD-10-CM | POA: Diagnosis not present

## 2020-04-09 ENCOUNTER — Ambulatory Visit: Payer: BC Managed Care – PPO | Admitting: Family Medicine

## 2020-04-10 ENCOUNTER — Ambulatory Visit: Payer: BC Managed Care – PPO | Admitting: Dietician

## 2020-04-17 ENCOUNTER — Ambulatory Visit: Payer: BC Managed Care – PPO | Admitting: Family Medicine

## 2020-04-29 DIAGNOSIS — M79606 Pain in leg, unspecified: Secondary | ICD-10-CM | POA: Diagnosis not present

## 2020-04-29 DIAGNOSIS — M542 Cervicalgia: Secondary | ICD-10-CM | POA: Diagnosis not present

## 2020-04-29 DIAGNOSIS — M47817 Spondylosis without myelopathy or radiculopathy, lumbosacral region: Secondary | ICD-10-CM | POA: Diagnosis not present

## 2020-04-29 DIAGNOSIS — G894 Chronic pain syndrome: Secondary | ICD-10-CM | POA: Diagnosis not present

## 2020-05-15 ENCOUNTER — Ambulatory Visit: Payer: BC Managed Care – PPO | Admitting: Dietician

## 2020-05-23 ENCOUNTER — Telehealth (INDEPENDENT_AMBULATORY_CARE_PROVIDER_SITE_OTHER): Payer: BC Managed Care – PPO | Admitting: Internal Medicine

## 2020-05-23 DIAGNOSIS — J329 Chronic sinusitis, unspecified: Secondary | ICD-10-CM | POA: Insufficient documentation

## 2020-05-23 DIAGNOSIS — J301 Allergic rhinitis due to pollen: Secondary | ICD-10-CM | POA: Diagnosis not present

## 2020-05-23 DIAGNOSIS — B373 Candidiasis of vulva and vagina: Secondary | ICD-10-CM | POA: Diagnosis not present

## 2020-05-23 DIAGNOSIS — H1010 Acute atopic conjunctivitis, unspecified eye: Secondary | ICD-10-CM | POA: Insufficient documentation

## 2020-05-23 DIAGNOSIS — B3731 Acute candidiasis of vulva and vagina: Secondary | ICD-10-CM

## 2020-05-23 DIAGNOSIS — H1013 Acute atopic conjunctivitis, bilateral: Secondary | ICD-10-CM

## 2020-05-23 MED ORDER — AZELASTINE HCL 0.05 % OP SOLN: 2.0000 [drp] | Freq: Two times a day (BID) | OPHTHALMIC | 1 refills | Status: AC

## 2020-05-23 MED ORDER — LEVOFLOXACIN 500 MG PO TABS
500.0000 mg | ORAL_TABLET | Freq: Every day | ORAL | 0 refills | Status: AC
Start: 2020-05-23 — End: 2020-06-02

## 2020-05-23 MED ORDER — PREDNISONE 10 MG PO TABS
ORAL_TABLET | ORAL | 0 refills | Status: DC
Start: 2020-05-23 — End: 2020-10-01

## 2020-05-23 MED ORDER — FLUCONAZOLE 150 MG PO TABS
ORAL_TABLET | ORAL | 1 refills | Status: DC
Start: 2020-05-23 — End: 2020-07-10

## 2020-05-23 NOTE — Progress Notes (Signed)
Patient ID: Nicole Melendez, female   DOB: 12-Jan-1976, 44 y.o.   MRN: [id number]  Virtual Visit via Video Note  I connected with Nicole Melendez on 05/23/20 at  8:20 AM EDT by a video enabled telemedicine application and verified that I am speaking with the correct person using two identifiers.  Location of all participants today Patient: at home Provider: at office   I discussed the limitations of evaluation and management by telemedicine and the availability of in person appointments. The patient expressed understanding and agreed to proceed.  History of Present Illness:  Here with 2-3 days acute onset fever, facial pain, pressure, headache, general weakness and malaise, and greenish d/c, with mild ST and cough, but pt denies chest pain, wheezing, increased sob or doe, orthopnea, PND, increased LE swelling, palpitations, dizziness or syncope.  Does have several wks ongoing nasal allergy symptoms with clearish congestion, itch and sneezing, without fever, pain, ST, cough, swelling or wheezing.  Also has yeast vaginal d/c, worsening with antibx Past Medical History:  Diagnosis Date  . Allergic rhinitis, cause unspecified   . Anemia   . Anxiety   . Depression   . Diabetes mellitus type II    steroid related, patient states "im no longer diabetic"  . Essential hypertension 08/08/2015  . GERD (gastroesophageal reflux disease)   . History of blood transfusion   . Hyperlipidemia   . Menorrhagia   . Migraine   . Morbid obesity ([place])   . Otitis media 06/28/2015  . Peptic ulcer disease   . Positive ANA (antinuclear antibody) 02/14/2012  . Sarcoid    including hand per rheumatology-Dr. Amil Amen  . Sarcoidosis of lung ([place])   . Shortness of breath    on exertion  . Varicose veins with pain    Past Surgical History:  Procedure Laterality Date  . ABDOMINAL HYSTERECTOMY    . COLONOSCOPY WITH PROPOFOL N/A 10/19/2016   Procedure: COLONOSCOPY WITH PROPOFOL;  Surgeon: Mauri Pole, MD;   Location: [facility];  Service: Endoscopy;  Laterality: N/A;  . ESOPHAGOGASTRODUODENOSCOPY (EGD) WITH PROPOFOL N/A 10/19/2016   Procedure: ESOPHAGOGASTRODUODENOSCOPY (EGD) WITH PROPOFOL;  Surgeon: Mauri Pole, MD;  Location: [facility];  Service: Endoscopy;  Laterality: N/A;  . HERNIA MESH REMOVAL  02/2013  . uterine ablation  03/2010  . WISDOM TOOTH EXTRACTION      reports that she quit smoking about 7 years ago. Her smoking use included cigarettes. She has a 20.00 pack-year smoking history. She has never used smokeless tobacco. She reports that she does not drink alcohol and does not use drugs. family history includes Allergies in her mother; Bone cancer in her maternal aunt; Breast cancer in her maternal aunt; Diabetes in her father and mother; Heart attack in her mother; Heart disease in her father; Hypertension in an other family member; Lung cancer in her maternal aunt; Ovarian cancer in her maternal aunt; Rheum arthritis in her father; Stroke in her father and maternal uncle. Allergies  Allergen Reactions  . Azithromycin Hives    (z pak) hives  . Bee Venom Anaphylaxis  . Flagyl [Metronidazole] Hives and Shortness Of Breath  . Penicillins Shortness Of Breath and Swelling    Has patient had a PCN reaction causing immediate rash, facial/tongue/throat swelling, SOB or lightheadedness with hypotension: Yes Has patient had a PCN reaction causing severe rash involving mucus membranes or skin necrosis: No Has patient had a PCN reaction that required hospitalization No Has patient had a PCN reaction occurring within  the last 10 years: No If all of the above answers are "NO", then may proceed with Cephalosporin use.  REACTION: swelling and difficulty breathing  . Shellfish Allergy Anaphylaxis  . Klonopin [Clonazepam] Other (See Comments)    Memory difficulty   Current Outpatient Medications on File Prior to Visit  Medication Sig Dispense Refill  . acetaminophen (TYLENOL) 650 MG CR  tablet Take 650 mg by mouth every 8 (eight) hours as needed for pain.    Marland Kitchen albuterol (VENTOLIN HFA) 108 (90 Base) MCG/ACT inhaler INHALE 2 PUFFS INTO THE LUNGS EVERY 4 HOURS AS NEEDED FOR WHEEZING OR SHORTNESS OF BREATH 8.5 g 0  . ALPRAZolam (XANAX) 0.5 MG tablet TAKE 1 TABLET(0.5 MG) BY MOUTH THREE TIMES DAILY AS NEEDED 90 tablet 2  . azelastine (ASTELIN) 0.1 % nasal spray Place 2 sprays into both nostrils 2 (two) times daily. Use in each nostril as directed 30 mL 12  . Blood Glucose Monitoring Suppl (ONETOUCH VERIO) w/Device KIT Use as directed daily E11.9 1 kit 0  . celecoxib (CELEBREX) 200 MG capsule Take 1 capsule (200 mg total) by mouth 2 (two) times daily as needed. 60 capsule 2  . cetirizine (ZYRTEC) 10 MG tablet TAKE 1 TABLET BY MOUTH DAILY 90 tablet 1  . colchicine 0.6 MG tablet Take 1 tablet (0.6 mg total) by mouth daily. 60 tablet 5  . cyclobenzaprine (FLEXERIL) 5 MG tablet TAKE 1 TABLET(5 MG) BY MOUTH THREE TIMES DAILY AS NEEDED FOR MUSCLE SPASMS 40 tablet 1  . Diclofenac Sodium (PENNSAID) 2 % SOLN Apply 1 pump twice daily as needed. (Patient taking differently: Apply 1 Pump topically 2 (two) times daily. Apply 1 pump twice daily) 112 g 3  . fluticasone (FLONASE) 50 MCG/ACT nasal spray Place 2 sprays into both nostrils daily. 16 g 0  . furosemide (LASIX) 40 MG tablet 1 tab by mouth every day in the AM and then 1 as needed only for persistent swelling in the PM (Patient not taking: Reported on 03/07/2020) 60 tablet 11  . gabapentin (NEURONTIN) 300 MG capsule TAKE 2 CAPSULES(600 MG) BY MOUTH THREE TIMES DAILY 270 capsule 5  . glucose blood (ONETOUCH VERIO) test strip Use as instructed three times daily E11.9 100 each 12  . Insulin Lispro (HUMALOG KWIKPEN) 200 UNIT/ML SOPN Inject 12 Units into the skin 4 (four) times daily. WITH MEALS W/ SLIDING SCALE ASD E11.9 5 pen 11  . Insulin Pen Needle (BD PEN NEEDLE NANO U/F) 32G X 4 MM MISC Use as directed three times daily E11.9 300 each 3  .  levalbuterol (XOPENEX HFA) 45 MCG/ACT inhaler Inhale 1-2 puffs into the lungs every 8 (eight) hours as needed for wheezing or shortness of breath. USE 2 PUFFS 3 TIMES DAILY X 5 DAYS THEN BACK TO HOME REGIMEN AS NEEDED. 1 Inhaler 0  . mometasone-formoterol (DULERA) 200-5 MCG/ACT AERO Inhale 2 puffs into the lungs 2 (two) times a day. 8.8 g 5  . omeprazole (PRILOSEC) 40 MG capsule TAKE 1 CAPSULE(40 MG) BY MOUTH DAILY 90 capsule 3  . ondansetron (ZOFRAN ODT) 4 MG disintegrating tablet Take 1 tablet (4 mg total) by mouth every 8 (eight) hours as needed for nausea or vomiting. 30 tablet 1  . oxyCODONE-acetaminophen (PERCOCET) 10-325 MG tablet Take 1 tablet by mouth every 8 (eight) hours as needed for pain.   0  . rizatriptan (MAXALT-MLT) 10 MG disintegrating tablet Take 1 tablet earliest onset of migraine.  May repeat in 2 hours if needed.  Maximum 2 tablets in 24 hours (Patient taking differently: Take 10 mg by mouth every 2 (two) hours as needed for migraine. Take 1 tablet earliest onset of migraine.  May repeat in 2 hours if needed.  Maximum 2 tablets in 24 hours) 9 tablet 3  . rosuvastatin (CRESTOR) 40 MG tablet TAKE 1 TABLET(40 MG) BY MOUTH DAILY 90 tablet 2  . sertraline (ZOLOFT) 100 MG tablet TAKE 2 TABLETS(200 MG) BY MOUTH DAILY 180 tablet 3  . SYMBICORT 160-4.5 MCG/ACT inhaler Inhale 2 puffs into the lungs 2 (two) times daily.    Marland Kitchen telmisartan (MICARDIS) 40 MG tablet TAKE 1 TABLET(40 MG) BY MOUTH DAILY 90 tablet 3  . telmisartan (MICARDIS) 40 MG tablet Take 40 mg by mouth daily.    . TURMERIC PO Take 1 tablet by mouth daily.    . vitamin B-12 (CYANOCOBALAMIN) 1000 MCG tablet Take 1,000 mcg by mouth daily.    . Vitamin D, Ergocalciferol, (DRISDOL) 1.25 MG (50000 UNIT) CAPS capsule Take 1 capsule (50,000 Units total) by mouth every 7 (seven) days. 12 capsule 0  . zonisamide (ZONEGRAN) 100 MG capsule TAKE 2 CAPSULES(200 MG) BY MOUTH DAILY 60 capsule 5   No current facility-administered medications  on file prior to visit.    Observations/Objective: Alert, NAD, appropriate mood and affect, resps normal, cn 2-12 intact, moves all 4s, no visible rash or swelling Lab Results  Component Value Date   WBC 9.6 11/20/2019   HGB 12.9 11/20/2019   HCT 39.8 11/20/2019   PLT 326 11/20/2019   GLUCOSE 157 (H) 01/24/2020   CHOL 145 01/24/2020   TRIG 130.0 01/24/2020   HDL 47.20 01/24/2020   LDLDIRECT 135.0 07/05/2011   LDLCALC 72 01/24/2020   ALT 11 01/24/2020   AST 14 01/24/2020   NA 138 01/24/2020   K 3.7 01/24/2020   CL 106 01/24/2020   CREATININE 0.70 01/24/2020   BUN 8 01/24/2020   CO2 24 01/24/2020   TSH 1.60 07/27/2019   INR 1.3 11/17/2012   HGBA1C 6.3 01/24/2020   MICROALBUR <0.7 07/27/2019   Assessment and Plan: See notes  Follow Up Instructions: See notes   I discussed the assessment and treatment plan with the patient. The patient was provided an opportunity to ask questions and all were answered. The patient agreed with the plan and demonstrated an understanding of the instructions.   The patient was advised to call back or seek an in-person evaluation if the symptoms worsen or if the condition fails to improve as anticipated.  Cathlean Cower, MD

## 2020-05-25 ENCOUNTER — Encounter: Payer: Self-pay | Admitting: Internal Medicine

## 2020-05-25 NOTE — Assessment & Plan Note (Signed)
For predpac asd 

## 2020-05-25 NOTE — Assessment & Plan Note (Addendum)
Mild to mod, for antibx course,  to f/u any worsening symptoms or concerns  I spent 31 minutes in preparing to see the patient by review of recent labs, imaging and procedures, obtaining and reviewing separately obtained history, communicating with the patient and family or caregiver, ordering medications, tests or procedures, and documenting clinical information in the EHR including the differential Dx, treatment, and any further evaluation and other management of sinusitis, yeast vaginitis, allergic rhinitis and conjunctivitis

## 2020-05-25 NOTE — Assessment & Plan Note (Signed)
Mild to mod, for antibx course,  to f/u any worsening symptoms or concerns 

## 2020-05-25 NOTE — Patient Instructions (Signed)
Please take all new medication as prescribed 

## 2020-05-26 LAB — HM DIABETES EYE EXAM

## 2020-05-28 ENCOUNTER — Encounter: Payer: Self-pay | Admitting: Internal Medicine

## 2020-05-30 DIAGNOSIS — M069 Rheumatoid arthritis, unspecified: Secondary | ICD-10-CM | POA: Diagnosis not present

## 2020-05-30 DIAGNOSIS — Z0279 Encounter for issue of other medical certificate: Secondary | ICD-10-CM

## 2020-05-30 DIAGNOSIS — Z79899 Other long term (current) drug therapy: Secondary | ICD-10-CM | POA: Diagnosis not present

## 2020-05-30 DIAGNOSIS — M79606 Pain in leg, unspecified: Secondary | ICD-10-CM | POA: Diagnosis not present

## 2020-05-30 DIAGNOSIS — M47817 Spondylosis without myelopathy or radiculopathy, lumbosacral region: Secondary | ICD-10-CM | POA: Diagnosis not present

## 2020-05-30 DIAGNOSIS — Z79891 Long term (current) use of opiate analgesic: Secondary | ICD-10-CM | POA: Diagnosis not present

## 2020-05-30 DIAGNOSIS — G894 Chronic pain syndrome: Secondary | ICD-10-CM | POA: Diagnosis not present

## 2020-05-30 NOTE — Telephone Encounter (Signed)
[employer] forms have been completed & Faxed, Copy sent to scan.   LVM to inform patient and original is ready to be picked up.

## 2020-06-10 ENCOUNTER — Other Ambulatory Visit: Payer: Self-pay | Admitting: Internal Medicine

## 2020-06-11 NOTE — Telephone Encounter (Signed)
Done erx 

## 2020-06-25 ENCOUNTER — Other Ambulatory Visit: Payer: Self-pay | Admitting: Internal Medicine

## 2020-07-01 DIAGNOSIS — M069 Rheumatoid arthritis, unspecified: Secondary | ICD-10-CM | POA: Diagnosis not present

## 2020-07-01 DIAGNOSIS — M79606 Pain in leg, unspecified: Secondary | ICD-10-CM | POA: Diagnosis not present

## 2020-07-01 DIAGNOSIS — M47817 Spondylosis without myelopathy or radiculopathy, lumbosacral region: Secondary | ICD-10-CM | POA: Diagnosis not present

## 2020-07-01 DIAGNOSIS — G894 Chronic pain syndrome: Secondary | ICD-10-CM | POA: Diagnosis not present

## 2020-07-03 ENCOUNTER — Encounter: Payer: Self-pay | Admitting: Internal Medicine

## 2020-07-03 MED ORDER — INSULIN ASPART 100 UNIT/ML FLEXPEN: PEN_INJECTOR | SUBCUTANEOUS | 11 refills | Status: DC

## 2020-07-03 NOTE — Telephone Encounter (Signed)
Error/No note needed at this time

## 2020-07-06 DIAGNOSIS — K0889 Other specified disorders of teeth and supporting structures: Secondary | ICD-10-CM | POA: Diagnosis not present

## 2020-07-10 ENCOUNTER — Other Ambulatory Visit: Payer: BC Managed Care – PPO

## 2020-07-10 ENCOUNTER — Other Ambulatory Visit: Payer: Self-pay

## 2020-07-10 ENCOUNTER — Telehealth (INDEPENDENT_AMBULATORY_CARE_PROVIDER_SITE_OTHER): Payer: BC Managed Care – PPO | Admitting: Internal Medicine

## 2020-07-10 ENCOUNTER — Encounter: Payer: Self-pay | Admitting: Internal Medicine

## 2020-07-10 DIAGNOSIS — Z20822 Contact with and (suspected) exposure to covid-19: Secondary | ICD-10-CM

## 2020-07-10 DIAGNOSIS — Z794 Long term (current) use of insulin: Secondary | ICD-10-CM

## 2020-07-10 DIAGNOSIS — B349 Viral infection, unspecified: Secondary | ICD-10-CM

## 2020-07-10 DIAGNOSIS — E1140 Type 2 diabetes mellitus with diabetic neuropathy, unspecified: Secondary | ICD-10-CM | POA: Diagnosis not present

## 2020-07-10 MED ORDER — FLUCONAZOLE 150 MG PO TABS
ORAL_TABLET | ORAL | 1 refills | Status: DC
Start: 2020-07-10 — End: 2021-03-26

## 2020-07-10 NOTE — Progress Notes (Signed)
   Subjective:    Patient ID: Nicole Melendez, female    DOB: [birth year], 44 y.o.   MRN: [id number]  HPI    Review of Systems     Objective:   Physical Exam        Assessment & Plan:

## 2020-07-10 NOTE — Assessment & Plan Note (Addendum)
Pt with hx of covid early 2020, and vaccination April 2021, now ill with viral illness after exposure to covid at work -   Needs urgent COVID testing, to consider monoclonal ab tx   Cont zofran and immodium prn, ok for diflucan asd  I spent 21 minutes in preparing to see the patient by review of recent labs, imaging and procedures, obtaining and reviewing separately obtained history, communicating with the patient and family or caregiver, ordering medications, tests or procedures, and documenting clinical information in the EHR including the differential Dx, treatment, and any further evaluation and other management of viral illness, exposure covid, dm

## 2020-07-12 ENCOUNTER — Encounter: Payer: Self-pay | Admitting: Internal Medicine

## 2020-07-12 LAB — SARS-COV-2, NAA 2 DAY TAT

## 2020-07-12 LAB — NOVEL CORONAVIRUS, NAA: SARS-CoV-2, NAA: NOT DETECTED

## 2020-07-12 NOTE — Progress Notes (Signed)
Patient ID: Nicole Melendez, female   DOB: 21-Jul-1976, 44 y.o.   MRN: [id number]  Virtual Visit via Video Note  I connected with Nicole Melendez on sept 16, 2021  at  8:40 AM EDT by a video enabled telemedicine application and verified that I am speaking with the correct person using two identifiers.  Location of all participants today Patient: at home Provider: at office   I discussed the limitations of evaluation and management by telemedicine and the availability of in person appointments. The patient expressed understanding and agreed to proceed.  History of Present Illness: Here to f/u, has hx of sarcoid and asthma, , now c/o onset symptoms sept 10 fatigue, sinus congestion, vomiting, diarrhea, hard to keep down fluids, and fever, chills.  Was seen at [place], given doxy course x 1 wk for dental infection? Not tested covid at that time.  HAs been vaccinated, had covid infection early 2020, has been exposed at work to covid again, thinks she may have it.  Has zofran and immodium prn at home.  Asks for diflucan for vaginitis symptoms. Pt denies chest pain, increased sob or doe, wheezing, orthopnea, PND, increased LE swelling, palpitations, dizziness or syncope.   Pt denies polydipsia, polyuria. Past Medical History:  Diagnosis Date  . Allergic rhinitis, cause unspecified   . Anemia   . Anxiety   . Depression   . Diabetes mellitus type II    steroid related, patient states "im no longer diabetic"  . Essential hypertension 08/08/2015  . GERD (gastroesophageal reflux disease)   . History of blood transfusion   . Hyperlipidemia   . Menorrhagia   . Migraine   . Morbid obesity ([place])   . Otitis media 06/28/2015  . Peptic ulcer disease   . Positive ANA (antinuclear antibody) 02/14/2012  . Sarcoid    including hand per rheumatology-Dr. Amil Amen  . Sarcoidosis of lung ([place])   . Shortness of breath    on exertion  . Varicose veins with pain    Past Surgical History:  Procedure Laterality  Date  . ABDOMINAL HYSTERECTOMY    . COLONOSCOPY WITH PROPOFOL N/A 10/19/2016   Procedure: COLONOSCOPY WITH PROPOFOL;  Surgeon: Mauri Pole, MD;  Location: [facility];  Service: Endoscopy;  Laterality: N/A;  . ESOPHAGOGASTRODUODENOSCOPY (EGD) WITH PROPOFOL N/A 10/19/2016   Procedure: ESOPHAGOGASTRODUODENOSCOPY (EGD) WITH PROPOFOL;  Surgeon: Mauri Pole, MD;  Location: [facility];  Service: Endoscopy;  Laterality: N/A;  . HERNIA MESH REMOVAL  02/2013  . uterine ablation  03/2010  . WISDOM TOOTH EXTRACTION      reports that she quit smoking about 7 years ago. Her smoking use included cigarettes. She has a 20.00 pack-year smoking history. She has never used smokeless tobacco. She reports that she does not drink alcohol and does not use drugs. family history includes Allergies in her mother; Bone cancer in her maternal aunt; Breast cancer in her maternal aunt; Diabetes in her father and mother; Heart attack in her mother; Heart disease in her father; Hypertension in an other family member; Lung cancer in her maternal aunt; Ovarian cancer in her maternal aunt; Rheum arthritis in her father; Stroke in her father and maternal uncle. Allergies  Allergen Reactions  . Azithromycin Hives    (z pak) hives  . Bee Venom Anaphylaxis  . Flagyl [Metronidazole] Hives and Shortness Of Breath  . Penicillins Shortness Of Breath and Swelling    Has patient had a PCN reaction causing immediate rash, facial/tongue/throat swelling,  SOB or lightheadedness with hypotension: Yes Has patient had a PCN reaction causing severe rash involving mucus membranes or skin necrosis: No Has patient had a PCN reaction that required hospitalization No Has patient had a PCN reaction occurring within the last 10 years: No If all of the above answers are "NO", then may proceed with Cephalosporin use.  REACTION: swelling and difficulty breathing  . Shellfish Allergy Anaphylaxis  . Klonopin [Clonazepam] Other (See  Comments)    Memory difficulty   Current Outpatient Medications on File Prior to Visit  Medication Sig Dispense Refill  . acetaminophen (TYLENOL) 650 MG CR tablet Take 650 mg by mouth every 8 (eight) hours as needed for pain.    Marland Kitchen albuterol (VENTOLIN HFA) 108 (90 Base) MCG/ACT inhaler INHALE 2 PUFFS INTO THE LUNGS EVERY 4 HOURS AS NEEDED FOR WHEEZING OR SHORTNESS OF BREATH 8.5 g 0  . ALPRAZolam (XANAX) 0.5 MG tablet TAKE 1 TABLET(0.5 MG) BY MOUTH THREE TIMES DAILY AS NEEDED 90 tablet 2  . azelastine (ASTELIN) 0.1 % nasal spray Place 2 sprays into both nostrils 2 (two) times daily. Use in each nostril as directed 30 mL 12  . Blood Glucose Monitoring Suppl (ONETOUCH VERIO) w/Device KIT Use as directed daily E11.9 1 kit 0  . celecoxib (CELEBREX) 200 MG capsule Take 1 capsule (200 mg total) by mouth 2 (two) times daily as needed. 60 capsule 2  . cetirizine (ZYRTEC) 10 MG tablet TAKE 1 TABLET BY MOUTH DAILY 90 tablet 1  . colchicine 0.6 MG tablet Take 1 tablet (0.6 mg total) by mouth daily. 60 tablet 5  . cyclobenzaprine (FLEXERIL) 5 MG tablet TAKE 1 TABLET(5 MG) BY MOUTH THREE TIMES DAILY AS NEEDED FOR MUSCLE SPASMS 40 tablet 1  . Diclofenac Sodium (PENNSAID) 2 % SOLN Apply 1 pump twice daily as needed. (Patient taking differently: Apply 1 Pump topically 2 (two) times daily. Apply 1 pump twice daily) 112 g 3  . fluticasone (FLONASE) 50 MCG/ACT nasal spray Place 2 sprays into both nostrils daily. 16 g 0  . furosemide (LASIX) 40 MG tablet 1 tab by mouth every day in the AM and then 1 as needed only for persistent swelling in the PM (Patient not taking: Reported on 03/07/2020) 60 tablet 11  . gabapentin (NEURONTIN) 300 MG capsule TAKE 2 CAPSULES(600 MG) BY MOUTH THREE TIMES DAILY 270 capsule 5  . glucose blood (ONETOUCH VERIO) test strip Use as instructed three times daily E11.9 100 each 12  . insulin aspart (NOVOLOG) 100 UNIT/ML FlexPen 12 units four times per day E11.9 15 mL 11  . Insulin Pen Needle  (BD PEN NEEDLE NANO U/F) 32G X 4 MM MISC Use as directed three times daily E11.9 300 each 3  . levalbuterol (XOPENEX HFA) 45 MCG/ACT inhaler Inhale 1-2 puffs into the lungs every 8 (eight) hours as needed for wheezing or shortness of breath. USE 2 PUFFS 3 TIMES DAILY X 5 DAYS THEN BACK TO HOME REGIMEN AS NEEDED. 1 Inhaler 0  . mometasone-formoterol (DULERA) 200-5 MCG/ACT AERO Inhale 2 puffs into the lungs 2 (two) times a day. 8.8 g 5  . omeprazole (PRILOSEC) 40 MG capsule TAKE 1 CAPSULE(40 MG) BY MOUTH DAILY 90 capsule 3  . ondansetron (ZOFRAN ODT) 4 MG disintegrating tablet Take 1 tablet (4 mg total) by mouth every 8 (eight) hours as needed for nausea or vomiting. 30 tablet 1  . oxyCODONE-acetaminophen (PERCOCET) 10-325 MG tablet Take 1 tablet by mouth every 8 (eight) hours as needed  for pain.   0  . predniSONE (DELTASONE) 10 MG tablet 3 tabs by mouth per day for 3 days,2tabs per day for 3 days,1tab per day for 3 days 18 tablet 0  . rizatriptan (MAXALT-MLT) 10 MG disintegrating tablet Take 1 tablet earliest onset of migraine.  May repeat in 2 hours if needed.  Maximum 2 tablets in 24 hours (Patient taking differently: Take 10 mg by mouth every 2 (two) hours as needed for migraine. Take 1 tablet earliest onset of migraine.  May repeat in 2 hours if needed.  Maximum 2 tablets in 24 hours) 9 tablet 3  . rosuvastatin (CRESTOR) 40 MG tablet TAKE 1 TABLET(40 MG) BY MOUTH DAILY 90 tablet 2  . sertraline (ZOLOFT) 100 MG tablet TAKE 2 TABLETS(200 MG) BY MOUTH DAILY 180 tablet 3  . SYMBICORT 160-4.5 MCG/ACT inhaler Inhale 2 puffs into the lungs 2 (two) times daily.    Marland Kitchen telmisartan (MICARDIS) 40 MG tablet TAKE 1 TABLET(40 MG) BY MOUTH DAILY 90 tablet 3  . telmisartan (MICARDIS) 40 MG tablet Take 40 mg by mouth daily.    . TURMERIC PO Take 1 tablet by mouth daily.    . vitamin B-12 (CYANOCOBALAMIN) 1000 MCG tablet Take 1,000 mcg by mouth daily.    . Vitamin D, Ergocalciferol, (DRISDOL) 1.25 MG (50000 UNIT) CAPS  capsule Take 1 capsule (50,000 Units total) by mouth every 7 (seven) days. 12 capsule 0  . zonisamide (ZONEGRAN) 100 MG capsule TAKE 2 CAPSULES(200 MG) BY MOUTH DAILY 60 capsule 5   No current facility-administered medications on file prior to visit.    Observations/Objective: Alert, NAD, appropriate mood and affect, resps normal, cn 2-12 intact, moves all 4s, no visible rash or swelling Lab Results  Component Value Date   WBC 9.6 11/20/2019   HGB 12.9 11/20/2019   HCT 39.8 11/20/2019   PLT 326 11/20/2019   GLUCOSE 157 (H) 01/24/2020   CHOL 145 01/24/2020   TRIG 130.0 01/24/2020   HDL 47.20 01/24/2020   LDLDIRECT 135.0 07/05/2011   LDLCALC 72 01/24/2020   ALT 11 01/24/2020   AST 14 01/24/2020   NA 138 01/24/2020   K 3.7 01/24/2020   CL 106 01/24/2020   CREATININE 0.70 01/24/2020   BUN 8 01/24/2020   CO2 24 01/24/2020   TSH 1.60 07/27/2019   INR 1.3 11/17/2012   HGBA1C 6.3 01/24/2020   MICROALBUR <0.7 07/27/2019   Assessment and Plan: See notes  Follow Up Instructions: See notes   I discussed the assessment and treatment plan with the patient. The patient was provided an opportunity to ask questions and all were answered. The patient agreed with the plan and demonstrated an understanding of the instructions.   The patient was advised to call back or seek an in-person evaluation if the symptoms worsen or if the condition fails to improve as anticipated.  Cathlean Cower, MD

## 2020-07-12 NOTE — Assessment & Plan Note (Signed)
For testing today

## 2020-07-12 NOTE — Assessment & Plan Note (Signed)
stable overall by history and exam, recent data reviewed with pt, and pt to continue medical treatment as before,  to f/u any worsening symptoms or concerns  

## 2020-07-12 NOTE — Patient Instructions (Signed)
Please take all new medication as prescribed  We will arrange the covid testing

## 2020-07-14 NOTE — Telephone Encounter (Signed)
I am out of office but should be fine for note to be out the several days last wk (and I guess today) for viral illness

## 2020-07-16 DIAGNOSIS — F4542 Pain disorder with related psychological factors: Secondary | ICD-10-CM | POA: Diagnosis not present

## 2020-07-16 DIAGNOSIS — G894 Chronic pain syndrome: Secondary | ICD-10-CM | POA: Diagnosis not present

## 2020-07-16 DIAGNOSIS — M792 Neuralgia and neuritis, unspecified: Secondary | ICD-10-CM | POA: Diagnosis not present

## 2020-07-16 DIAGNOSIS — G609 Hereditary and idiopathic neuropathy, unspecified: Secondary | ICD-10-CM | POA: Diagnosis not present

## 2020-07-30 DIAGNOSIS — M542 Cervicalgia: Secondary | ICD-10-CM | POA: Diagnosis not present

## 2020-07-30 DIAGNOSIS — M79606 Pain in leg, unspecified: Secondary | ICD-10-CM | POA: Diagnosis not present

## 2020-07-30 DIAGNOSIS — G894 Chronic pain syndrome: Secondary | ICD-10-CM | POA: Diagnosis not present

## 2020-07-30 DIAGNOSIS — M47817 Spondylosis without myelopathy or radiculopathy, lumbosacral region: Secondary | ICD-10-CM | POA: Diagnosis not present

## 2020-08-03 ENCOUNTER — Other Ambulatory Visit: Payer: Self-pay | Admitting: Neurology

## 2020-08-24 ENCOUNTER — Other Ambulatory Visit: Payer: Self-pay | Admitting: Internal Medicine

## 2020-08-25 DIAGNOSIS — R419 Unspecified symptoms and signs involving cognitive functions and awareness: Secondary | ICD-10-CM | POA: Diagnosis not present

## 2020-08-26 DIAGNOSIS — M79606 Pain in leg, unspecified: Secondary | ICD-10-CM | POA: Diagnosis not present

## 2020-08-26 DIAGNOSIS — M069 Rheumatoid arthritis, unspecified: Secondary | ICD-10-CM | POA: Diagnosis not present

## 2020-08-26 DIAGNOSIS — G894 Chronic pain syndrome: Secondary | ICD-10-CM | POA: Diagnosis not present

## 2020-08-26 DIAGNOSIS — M47817 Spondylosis without myelopathy or radiculopathy, lumbosacral region: Secondary | ICD-10-CM | POA: Diagnosis not present

## 2020-08-26 DIAGNOSIS — R419 Unspecified symptoms and signs involving cognitive functions and awareness: Secondary | ICD-10-CM | POA: Diagnosis not present

## 2020-08-27 DIAGNOSIS — R419 Unspecified symptoms and signs involving cognitive functions and awareness: Secondary | ICD-10-CM | POA: Diagnosis not present

## 2020-08-28 ENCOUNTER — Other Ambulatory Visit: Payer: Self-pay | Admitting: Neurology

## 2020-08-28 DIAGNOSIS — R419 Unspecified symptoms and signs involving cognitive functions and awareness: Secondary | ICD-10-CM | POA: Diagnosis not present

## 2020-08-29 DIAGNOSIS — R419 Unspecified symptoms and signs involving cognitive functions and awareness: Secondary | ICD-10-CM | POA: Diagnosis not present

## 2020-09-09 ENCOUNTER — Ambulatory Visit: Payer: BC Managed Care – PPO | Admitting: Internal Medicine

## 2020-09-12 ENCOUNTER — Other Ambulatory Visit: Payer: Self-pay

## 2020-09-12 ENCOUNTER — Telehealth (INDEPENDENT_AMBULATORY_CARE_PROVIDER_SITE_OTHER): Payer: BC Managed Care – PPO | Admitting: Internal Medicine

## 2020-09-12 DIAGNOSIS — I5032 Chronic diastolic (congestive) heart failure: Secondary | ICD-10-CM

## 2020-09-12 DIAGNOSIS — J069 Acute upper respiratory infection, unspecified: Secondary | ICD-10-CM | POA: Insufficient documentation

## 2020-09-12 DIAGNOSIS — E1140 Type 2 diabetes mellitus with diabetic neuropathy, unspecified: Secondary | ICD-10-CM | POA: Diagnosis not present

## 2020-09-12 DIAGNOSIS — J329 Chronic sinusitis, unspecified: Secondary | ICD-10-CM

## 2020-09-12 DIAGNOSIS — J449 Chronic obstructive pulmonary disease, unspecified: Secondary | ICD-10-CM

## 2020-09-12 DIAGNOSIS — Z794 Long term (current) use of insulin: Secondary | ICD-10-CM

## 2020-09-12 MED ORDER — DOXYCYCLINE HYCLATE 100 MG PO TABS: 100.0000 mg | ORAL_TABLET | Freq: Two times a day (BID) | ORAL | 0 refills | Status: DC

## 2020-09-12 NOTE — Progress Notes (Signed)
Patient ID: Nicole Melendez, female   DOB: 15-May-1976, 44 y.o.   MRN: [id number]  Virtual Visit via Video Note  I connected with Nicole Melendez on 09/12/20 at  2:40 PM EST by a video enabled telemedicine application and verified that I am speaking with the correct person using two identifiers.  Location of all participants today Patient: at home Provider: at office   I discussed the limitations of evaluation and management by telemedicine and the availability of in person appointments. The patient expressed understanding and agreed to proceed.  History of Present Illness:  Here with 2-3 days acute onset fever, facial pain, pressure, headache, general weakness and malaise, and greenish d/c, with mild ST and cough as well as diarrhea, but pt denies loss of taste and smell,  chest pain, wheezing, increased sob or doe, orthopnea, PND, increased LE swelling, palpitations, dizziness or syncope. Works in a call center, someone always ill, was tested neg for covid about 2 wks ago.   Past Medical History:  Diagnosis Date   Allergic rhinitis, cause unspecified    Anemia    Anxiety    Depression    Diabetes mellitus type II    steroid related, patient states "im no longer diabetic"   Essential hypertension 08/08/2015   GERD (gastroesophageal reflux disease)    History of blood transfusion    Hyperlipidemia    Menorrhagia    Migraine    Morbid obesity ([place])    Otitis media 06/28/2015   Peptic ulcer disease    Positive ANA (antinuclear antibody) 02/14/2012   Sarcoid    including hand per rheumatology-Dr. 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([place])    Shortness of breath    on exertion   Varicose veins with pain    Past Surgical History:  Procedure Laterality Date   ABDOMINAL HYSTERECTOMY     COLONOSCOPY WITH PROPOFOL N/A 10/19/2016   Procedure: COLONOSCOPY WITH PROPOFOL;  Surgeon: Mauri Pole, MD;  Location: [facility];  Service: Endoscopy;  Laterality: N/A;    ESOPHAGOGASTRODUODENOSCOPY (EGD) WITH PROPOFOL N/A 10/19/2016   Procedure: ESOPHAGOGASTRODUODENOSCOPY (EGD) WITH PROPOFOL;  Surgeon: Mauri Pole, MD;  Location: [facility];  Service: Endoscopy;  Laterality: N/A;   HERNIA MESH REMOVAL  02/2013   uterine ablation  03/2010   WISDOM TOOTH EXTRACTION      reports that she quit smoking about 7 years ago. Her smoking use included cigarettes. She has a 20.00 pack-year smoking history. She has never used smokeless tobacco. She reports that she does not drink alcohol and does not use drugs. family history includes Allergies in her mother; Bone cancer in her maternal aunt; Breast cancer in her maternal aunt; Diabetes in her father and mother; Heart attack in her mother; Heart disease in her father; Hypertension in an other family member; Lung cancer in her maternal aunt; Ovarian cancer in her maternal aunt; Rheum arthritis in her father; Stroke in her father and maternal uncle. Allergies  Allergen Reactions   Azithromycin Hives    (z pak) hives   Bee Venom Anaphylaxis   Flagyl [Metronidazole] Hives and Shortness Of Breath   Penicillins Shortness Of Breath and Swelling    Has patient had a PCN reaction causing immediate rash, facial/tongue/throat swelling, SOB or lightheadedness with hypotension: Yes Has patient had a PCN reaction causing severe rash involving mucus membranes or skin necrosis: No Has patient had a PCN reaction that required hospitalization No Has patient had a PCN reaction occurring within the last 10  years: No If all of the above answers are "NO", then may proceed with Cephalosporin use.  REACTION: swelling and difficulty breathing   Shellfish Allergy Anaphylaxis   Klonopin [Clonazepam] Other (See Comments)    Memory difficulty   Current Outpatient Medications on File Prior to Visit  Medication Sig Dispense Refill   acetaminophen (TYLENOL) 650 MG CR tablet Take 650 mg by mouth every 8 (eight) hours as needed for  pain.     albuterol (VENTOLIN HFA) 108 (90 Base) MCG/ACT inhaler INHALE 2 PUFFS INTO THE LUNGS EVERY 4 HOURS AS NEEDED FOR WHEEZING OR SHORTNESS OF BREATH 8.5 g 0   ALPRAZolam (XANAX) 0.5 MG tablet TAKE 1 TABLET(0.5 MG) BY MOUTH THREE TIMES DAILY AS NEEDED 90 tablet 2   azelastine (ASTELIN) 0.1 % nasal spray Place 2 sprays into both nostrils 2 (two) times daily. Use in each nostril as directed 30 mL 12   Blood Glucose Monitoring Suppl (ONETOUCH VERIO) w/Device KIT Use as directed daily E11.9 1 kit 0   celecoxib (CELEBREX) 200 MG capsule Take 1 capsule (200 mg total) by mouth 2 (two) times daily as needed. 60 capsule 2   cetirizine (ZYRTEC) 10 MG tablet TAKE 1 TABLET BY MOUTH DAILY 90 tablet 1   colchicine 0.6 MG tablet Take 1 tablet (0.6 mg total) by mouth daily. 60 tablet 5   cyclobenzaprine (FLEXERIL) 5 MG tablet TAKE 1 TABLET(5 MG) BY MOUTH THREE TIMES DAILY AS NEEDED FOR MUSCLE SPASMS 40 tablet 1   Diclofenac Sodium (PENNSAID) 2 % SOLN Apply 1 pump twice daily as needed. (Patient taking differently: Apply 1 Pump topically 2 (two) times daily. Apply 1 pump twice daily) 112 g 3   fluconazole (DIFLUCAN) 150 MG tablet 1 tab by mouth every 3 days as needed 2 tablet 1   fluticasone (FLONASE) 50 MCG/ACT nasal spray Place 2 sprays into both nostrils daily. 16 g 0   furosemide (LASIX) 40 MG tablet 1 tab by mouth every day in the AM and then 1 as needed only for persistent swelling in the PM (Patient not taking: Reported on 03/07/2020) 60 tablet 11   gabapentin (NEURONTIN) 300 MG capsule TAKE 2 CAPSULES(600 MG) BY MOUTH THREE TIMES DAILY 270 capsule 5   glucose blood (ONETOUCH VERIO) test strip USE AS DIRECTED THREE TIMES DAILY 100 strip 11   insulin aspart (NOVOLOG) 100 UNIT/ML FlexPen 12 units four times per day E11.9 15 mL 11   Insulin Pen Needle (BD PEN NEEDLE NANO U/F) 32G X 4 MM MISC Use as directed three times daily E11.9 300 each 3   levalbuterol (XOPENEX HFA) 45 MCG/ACT inhaler  Inhale 1-2 puffs into the lungs every 8 (eight) hours as needed for wheezing or shortness of breath. USE 2 PUFFS 3 TIMES DAILY X 5 DAYS THEN BACK TO HOME REGIMEN AS NEEDED. 1 Inhaler 0   mometasone-formoterol (DULERA) 200-5 MCG/ACT AERO Inhale 2 puffs into the lungs 2 (two) times a day. 8.8 g 5   omeprazole (PRILOSEC) 40 MG capsule TAKE 1 CAPSULE(40 MG) BY MOUTH DAILY 90 capsule 3   ondansetron (ZOFRAN ODT) 4 MG disintegrating tablet Take 1 tablet (4 mg total) by mouth every 8 (eight) hours as needed for nausea or vomiting. 30 tablet 1   oxyCODONE-acetaminophen (PERCOCET) 10-325 MG tablet Take 1 tablet by mouth every 8 (eight) hours as needed for pain.   0   predniSONE (DELTASONE) 10 MG tablet 3 tabs by mouth per day for 3 days,2tabs per day for 3  days,1tab per day for 3 days 18 tablet 0   rizatriptan (MAXALT-MLT) 10 MG disintegrating tablet Take 1 tablet earliest onset of migraine.  May repeat in 2 hours if needed.  Maximum 2 tablets in 24 hours (Patient taking differently: Take 10 mg by mouth every 2 (two) hours as needed for migraine. Take 1 tablet earliest onset of migraine.  May repeat in 2 hours if needed.  Maximum 2 tablets in 24 hours) 9 tablet 3   rosuvastatin (CRESTOR) 40 MG tablet TAKE 1 TABLET(40 MG) BY MOUTH DAILY 90 tablet 2   sertraline (ZOLOFT) 100 MG tablet TAKE 2 TABLETS(200 MG) BY MOUTH DAILY 180 tablet 3   SYMBICORT 160-4.5 MCG/ACT inhaler Inhale 2 puffs into the lungs 2 (two) times daily.     telmisartan (MICARDIS) 40 MG tablet TAKE 1 TABLET(40 MG) BY MOUTH DAILY 90 tablet 3   telmisartan (MICARDIS) 40 MG tablet Take 40 mg by mouth daily.     TURMERIC PO Take 1 tablet by mouth daily.     vitamin B-12 (CYANOCOBALAMIN) 1000 MCG tablet Take 1,000 mcg by mouth daily.     Vitamin D, Ergocalciferol, (DRISDOL) 1.25 MG (50000 UNIT) CAPS capsule Take 1 capsule (50,000 Units total) by mouth every 7 (seven) days. 12 capsule 0   zonisamide (ZONEGRAN) 100 MG capsule TAKE 2  CAPSULES(200 MG) BY MOUTH DAILY 60 capsule 0   No current facility-administered medications on file prior to visit.   Observations/Objective: Alert, NAD, appropriate mood and affect, resps normal, cn 2-12 intact, moves all 4s, no visible rash or swelling Lab Results  Component Value Date   WBC 9.6 11/20/2019   HGB 12.9 11/20/2019   HCT 39.8 11/20/2019   PLT 326 11/20/2019   GLUCOSE 157 (H) 01/24/2020   CHOL 145 01/24/2020   TRIG 130.0 01/24/2020   HDL 47.20 01/24/2020   LDLDIRECT 135.0 07/05/2011   LDLCALC 72 01/24/2020   ALT 11 01/24/2020   AST 14 01/24/2020   NA 138 01/24/2020   K 3.7 01/24/2020   CL 106 01/24/2020   CREATININE 0.70 01/24/2020   BUN 8 01/24/2020   CO2 24 01/24/2020   TSH 1.60 07/27/2019   INR 1.3 11/17/2012   HGBA1C 6.3 01/24/2020   MICROALBUR <0.7 07/27/2019   Assessment and Plan: See notes  Follow Up Instructions: See notes   I discussed the assessment and treatment plan with the patient. The patient was provided an opportunity to ask questions and all were answered. The patient agreed with the plan and demonstrated an understanding of the instructions.   The patient was advised to call back or seek an in-person evaluation if the symptoms worsen or if the condition fails to improve as anticipated.  Cathlean Cower, MD

## 2020-09-12 NOTE — Patient Instructions (Signed)
Please take all new medication as prescribed  Make sure to get covid tested

## 2020-09-13 ENCOUNTER — Encounter: Payer: Self-pay | Admitting: Internal Medicine

## 2020-09-13 NOTE — Assessment & Plan Note (Signed)
stable overall by history and exam, recent data reviewed with pt, and pt to continue medical treatment as before,  to f/u any worsening symptoms or concerns  

## 2020-09-13 NOTE — Assessment & Plan Note (Addendum)
Mild to mod, for antibx course,  to f/u any worsening symptoms or concerns, and urged to have repeat covid testing asap  I spent 31 minutes in preparing to see the patient by review of recent labs, imaging and procedures, obtaining and reviewing separately obtained history, communicating with the patient and family or caregiver, ordering medications, tests or procedures, and documenting clinical information in the EHR including the differential Dx, treatment, and any further evaluation and other management of sinusitis, copd, chf, dm

## 2020-09-15 ENCOUNTER — Telehealth: Payer: Self-pay | Admitting: Internal Medicine

## 2020-09-15 NOTE — Telephone Encounter (Signed)
  Patient calling to report COVID test was positive. Requesting IV infusion be arranged as discussed during video visit 11/19

## 2020-09-16 ENCOUNTER — Encounter: Payer: Self-pay | Admitting: Internal Medicine

## 2020-09-16 NOTE — Telephone Encounter (Signed)
Please to call for pt to be arranged for monoclonal antibody treatment  Thanks

## 2020-09-17 ENCOUNTER — Other Ambulatory Visit: Payer: Self-pay | Admitting: Neurology

## 2020-09-18 ENCOUNTER — Encounter: Payer: Self-pay | Admitting: Nurse Practitioner

## 2020-09-18 ENCOUNTER — Telehealth: Payer: Self-pay | Admitting: Nurse Practitioner

## 2020-09-18 DIAGNOSIS — U071 COVID-19: Secondary | ICD-10-CM

## 2020-09-18 NOTE — Telephone Encounter (Signed)
Called to Discuss with patient about Covid symptoms and the use of a monoclonal antibody infusion for those with mild to moderate Covid symptoms and at a high risk of hospitalization.     Pt is qualified for this infusion at the [facility] center due to co-morbid conditions and/or a member of an at-risk group.     Unable to reach pt. Left message to return call. Sent [employer].   Beckey Rutter, [place], AGNP-C [phone] ([place])

## 2020-09-19 ENCOUNTER — Other Ambulatory Visit: Payer: Self-pay | Admitting: Physician Assistant

## 2020-09-19 ENCOUNTER — Ambulatory Visit (HOSPITAL_COMMUNITY)
Admission: RE | Admit: 2020-09-19 | Discharge: 2020-09-19 | Disposition: A | Discharge status: Home / Self Care | Payer: BC Managed Care – PPO | Source: Ambulatory Visit | Attending: Pulmonary Disease | Admitting: Pulmonary Disease

## 2020-09-19 DIAGNOSIS — E1140 Type 2 diabetes mellitus with diabetic neuropathy, unspecified: Secondary | ICD-10-CM

## 2020-09-19 DIAGNOSIS — U071 COVID-19: Secondary | ICD-10-CM | POA: Diagnosis not present

## 2020-09-19 DIAGNOSIS — D860 Sarcoidosis of lung: Secondary | ICD-10-CM

## 2020-09-19 DIAGNOSIS — I10 Essential (primary) hypertension: Secondary | ICD-10-CM | POA: Diagnosis not present

## 2020-09-19 DIAGNOSIS — Z794 Long term (current) use of insulin: Secondary | ICD-10-CM

## 2020-09-19 MED ORDER — ALBUTEROL SULFATE HFA 108 (90 BASE) MCG/ACT IN AERS: 2.0000 | INHALATION_SPRAY | Freq: Once | RESPIRATORY_TRACT | Status: DC | PRN

## 2020-09-19 MED ORDER — EPINEPHRINE 0.3 MG/0.3ML IJ SOAJ: 0.3000 mg | Freq: Once | INTRAMUSCULAR | Status: DC | PRN

## 2020-09-19 MED ORDER — DIPHENHYDRAMINE HCL 50 MG/ML IJ SOLN: 50.0000 mg | Freq: Once | INTRAMUSCULAR | Status: DC | PRN

## 2020-09-19 MED ORDER — METHYLPREDNISOLONE SODIUM SUCC 125 MG IJ SOLR: 125.0000 mg | Freq: Once | INTRAMUSCULAR | Status: DC | PRN

## 2020-09-19 MED ORDER — SOTROVIMAB 500 MG/8ML IV SOLN
500.0000 mg | Freq: Once | INTRAVENOUS | Status: AC
  Administered 2020-09-19: 500 mg via INTRAVENOUS

## 2020-09-19 MED ORDER — SODIUM CHLORIDE 0.9 % IV SOLN: INTRAVENOUS | Status: DC | PRN

## 2020-09-19 MED ORDER — FAMOTIDINE IN NACL 20-0.9 MG/50ML-% IV SOLN: 20.0000 mg | Freq: Once | INTRAVENOUS | Status: DC | PRN

## 2020-09-19 NOTE — Discharge Instructions (Signed)

## 2020-09-19 NOTE — Progress Notes (Signed)
Diagnosis: COVID-19  Physician: Dr. Patrick Wright  Procedure: Covid Infusion Clinic Med: Sotrovimab infusion - Provided patient with sotrovimab fact sheet for patients, parents, and caregivers prior to infusion.   Complications: No immediate complications noted  Discharge: Discharged home    

## 2020-09-19 NOTE — Progress Notes (Signed)
I connected by phone with Nicole Melendez on 09/19/2020 at 9:19 AM to discuss the potential use of a new treatment for mild to moderate COVID-19 viral infection in non-hospitalized patients.  This patient is a 44 y.o. female that meets the FDA criteria for Emergency Use Authorization of COVID monoclonal antibody casirivimab/imdevimab, bamlanivimab/eteseviamb, or sotrovimab.  Has a (+) direct SARS-CoV-2 viral test result  Has mild or moderate COVID-19   Is NOT hospitalized due to COVID-19  Is within 10 days of symptom onset  Has at least one of the high risk factor(s) for progression to severe COVID-19 and/or hospitalization as defined in EUA.  Specific high risk criteria : BMI > 25, Diabetes, Cardiovascular disease or hypertension and Chronic Lung Disease   I have spoken and communicated the following to the patient or parent/caregiver regarding COVID monoclonal antibody treatment:  1. FDA has authorized the emergency use for the treatment of mild to moderate COVID-19 in adults and pediatric patients with positive results of direct SARS-CoV-2 viral testing who are 34 years of age and older weighing at least 40 kg, and who are at high risk for progressing to severe COVID-19 and/or hospitalization.  2. The significant known and potential risks and benefits of COVID monoclonal antibody, and the extent to which such potential risks and benefits are unknown.  3. Information on available alternative treatments and the risks and benefits of those alternatives, including clinical trials.  4. Patients treated with COVID monoclonal antibody should continue to self-isolate and use infection control measures (e.g., wear mask, isolate, social distance, avoid sharing personal items, clean and disinfect high touch surfaces, and frequent handwashing) according to CDC guidelines.   5. The patient or parent/caregiver has the option to accept or refuse COVID monoclonal antibody treatment.  After  reviewing this information with the patient, the patient has agreed to receive one of the available covid 19 monoclonal antibodies and will be provided an appropriate fact sheet prior to infusion. [name], [place] 09/19/2020 9:19 AM

## 2020-09-19 NOTE — Progress Notes (Signed)
Patient reviewed Fact Sheet for Patients, Parents, and Caregivers for Emergency Use Authorization (EUA) of Sotrovimab for the Treatment of Coronavirus. Patient also reviewed and is agreeable to the estimated cost of treatment. Patient is agreeable to proceed.   

## 2020-09-22 ENCOUNTER — Encounter: Payer: Self-pay | Admitting: Internal Medicine

## 2020-09-22 MED ORDER — ONDANSETRON 4 MG PO TBDP: 4.0000 mg | ORAL_TABLET | Freq: Three times a day (TID) | ORAL | 1 refills | Status: DC | PRN

## 2020-09-22 MED ORDER — DIPHENOXYLATE-ATROPINE 2.5-0.025 MG PO TABS: 1.0000 | ORAL_TABLET | Freq: Four times a day (QID) | ORAL | 0 refills | Status: DC | PRN

## 2020-09-24 ENCOUNTER — Encounter: Payer: Self-pay | Admitting: Internal Medicine

## 2020-09-24 ENCOUNTER — Telehealth (INDEPENDENT_AMBULATORY_CARE_PROVIDER_SITE_OTHER): Payer: BC Managed Care – PPO | Admitting: Internal Medicine

## 2020-09-24 DIAGNOSIS — E1140 Type 2 diabetes mellitus with diabetic neuropathy, unspecified: Secondary | ICD-10-CM | POA: Diagnosis not present

## 2020-09-24 DIAGNOSIS — U071 COVID-19: Secondary | ICD-10-CM

## 2020-09-24 DIAGNOSIS — Z794 Long term (current) use of insulin: Secondary | ICD-10-CM

## 2020-09-24 DIAGNOSIS — F411 Generalized anxiety disorder: Secondary | ICD-10-CM

## 2020-09-24 DIAGNOSIS — J449 Chronic obstructive pulmonary disease, unspecified: Secondary | ICD-10-CM | POA: Diagnosis not present

## 2020-09-24 NOTE — Progress Notes (Signed)
Patient ID: Nicole Melendez, female   DOB: 1975-12-21, 44 y.o.   MRN: [id number]  Virtual Visit via Video Note  I connected with Nicole Melendez on 09/24/20 at 10:00 AM EST by a video enabled telemedicine application and verified that I am speaking with the correct person using two identifiers.  Location of all participants today Patient: at home Provider: at office   I discussed the limitations of evaluation and management by telemedicine and the availability of in person appointments. The patient expressed understanding and agreed to proceed.  History of Present Illness: Here to f/u post covid infection with symptoms starting nov 17, diagnosis nov 18, last worked nov 16 and plans to return to work dec 6.  Has had cold chills, diarrhea, n/v, feeling off balance, and bilateral ear discomfort.  Had the monoclonal ab, plans to finish antibx today, and after a mild increase, the sugars are now lower again.  Pt denies chest pain, increased sob or doe, wheezing, orthopnea, PND, increased LE swelling, palpitations, dizziness or syncope.  Pt denies new neurological symptoms such as new headache, or facial or extremity weakness or numbness   Pt denies polydipsia,polyuria.  Denies worsening depressive symptoms, suicidal ideation, or panic Past Medical History:  Diagnosis Date  . Allergic rhinitis, cause unspecified   . Anemia   . Anxiety   . Depression   . Diabetes mellitus type II    steroid related, patient states "im no longer diabetic"  . Essential hypertension 08/08/2015  . GERD (gastroesophageal reflux disease)   . History of blood transfusion   . Hyperlipidemia   . Menorrhagia   . Migraine   . Morbid obesity ([place])   . Otitis media 06/28/2015  . Peptic ulcer disease   . Positive ANA (antinuclear antibody) 02/14/2012  . Sarcoid    including hand per rheumatology-Dr. Amil Amen  . Sarcoidosis of lung ([place])   . Shortness of breath    on exertion  . Varicose veins with pain    Past Surgical  History:  Procedure Laterality Date  . ABDOMINAL HYSTERECTOMY    . COLONOSCOPY WITH PROPOFOL N/A 10/19/2016   Procedure: COLONOSCOPY WITH PROPOFOL;  Surgeon: Mauri Pole, MD;  Location: [facility];  Service: Endoscopy;  Laterality: N/A;  . ESOPHAGOGASTRODUODENOSCOPY (EGD) WITH PROPOFOL N/A 10/19/2016   Procedure: ESOPHAGOGASTRODUODENOSCOPY (EGD) WITH PROPOFOL;  Surgeon: Mauri Pole, MD;  Location: [facility];  Service: Endoscopy;  Laterality: N/A;  . HERNIA MESH REMOVAL  02/2013  . uterine ablation  03/2010  . WISDOM TOOTH EXTRACTION      reports that she quit smoking about 7 years ago. Her smoking use included cigarettes. She has a 20.00 pack-year smoking history. She has never used smokeless tobacco. She reports that she does not drink alcohol and does not use drugs. family history includes Allergies in her mother; Bone cancer in her maternal aunt; Breast cancer in her maternal aunt; Diabetes in her father and mother; Heart attack in her mother; Heart disease in her father; Hypertension in an other family member; Lung cancer in her maternal aunt; Ovarian cancer in her maternal aunt; Rheum arthritis in her father; Stroke in her father and maternal uncle. Allergies  Allergen Reactions  . Azithromycin Hives    (z pak) hives  . Bee Venom Anaphylaxis  . Flagyl [Metronidazole] Hives and Shortness Of Breath  . Penicillins Shortness Of Breath and Swelling    Has patient had a PCN reaction causing immediate rash, facial/tongue/throat swelling, SOB or lightheadedness with hypotension:  Yes Has patient had a PCN reaction causing severe rash involving mucus membranes or skin necrosis: No Has patient had a PCN reaction that required hospitalization No Has patient had a PCN reaction occurring within the last 10 years: No If all of the above answers are "NO", then may proceed with Cephalosporin use.  REACTION: swelling and difficulty breathing  . Shellfish Allergy Anaphylaxis  .  Klonopin [Clonazepam] Other (See Comments)    Memory difficulty   Current Outpatient Medications on File Prior to Visit  Medication Sig Dispense Refill  . acetaminophen (TYLENOL) 650 MG CR tablet Take 650 mg by mouth every 8 (eight) hours as needed for pain.    Marland Kitchen albuterol (VENTOLIN HFA) 108 (90 Base) MCG/ACT inhaler INHALE 2 PUFFS INTO THE LUNGS EVERY 4 HOURS AS NEEDED FOR WHEEZING OR SHORTNESS OF BREATH 8.5 g 0  . ALPRAZolam (XANAX) 0.5 MG tablet TAKE 1 TABLET(0.5 MG) BY MOUTH THREE TIMES DAILY AS NEEDED 90 tablet 2  . azelastine (ASTELIN) 0.1 % nasal spray Place 2 sprays into both nostrils 2 (two) times daily. Use in each nostril as directed 30 mL 12  . Blood Glucose Monitoring Suppl (ONETOUCH VERIO) w/Device KIT Use as directed daily E11.9 1 kit 0  . celecoxib (CELEBREX) 200 MG capsule Take 1 capsule (200 mg total) by mouth 2 (two) times daily as needed. 60 capsule 2  . cetirizine (ZYRTEC) 10 MG tablet TAKE 1 TABLET BY MOUTH DAILY 90 tablet 1  . colchicine 0.6 MG tablet Take 1 tablet (0.6 mg total) by mouth daily. 60 tablet 5  . cyclobenzaprine (FLEXERIL) 5 MG tablet TAKE 1 TABLET(5 MG) BY MOUTH THREE TIMES DAILY AS NEEDED FOR MUSCLE SPASMS 40 tablet 1  . Diclofenac Sodium (PENNSAID) 2 % SOLN Apply 1 pump twice daily as needed. (Patient taking differently: Apply 1 Pump topically 2 (two) times daily. Apply 1 pump twice daily) 112 g 3  . diphenoxylate-atropine (LOMOTIL) 2.5-0.025 MG tablet Take 1 tablet by mouth 4 (four) times daily as needed for diarrhea or loose stools. 30 tablet 0  . doxycycline (VIBRA-TABS) 100 MG tablet Take 1 tablet (100 mg total) by mouth 2 (two) times daily. 20 tablet 0  . fluconazole (DIFLUCAN) 150 MG tablet 1 tab by mouth every 3 days as needed 2 tablet 1  . fluticasone (FLONASE) 50 MCG/ACT nasal spray Place 2 sprays into both nostrils daily. 16 g 0  . furosemide (LASIX) 40 MG tablet 1 tab by mouth every day in the AM and then 1 as needed only for persistent swelling  in the PM (Patient not taking: Reported on 03/07/2020) 60 tablet 11  . gabapentin (NEURONTIN) 300 MG capsule TAKE 2 CAPSULES(600 MG) BY MOUTH THREE TIMES DAILY 270 capsule 5  . glucose blood (ONETOUCH VERIO) test strip USE AS DIRECTED THREE TIMES DAILY 100 strip 11  . insulin aspart (NOVOLOG) 100 UNIT/ML FlexPen 12 units four times per day E11.9 15 mL 11  . Insulin Pen Needle (BD PEN NEEDLE NANO U/F) 32G X 4 MM MISC Use as directed three times daily E11.9 300 each 3  . levalbuterol (XOPENEX HFA) 45 MCG/ACT inhaler Inhale 1-2 puffs into the lungs every 8 (eight) hours as needed for wheezing or shortness of breath. USE 2 PUFFS 3 TIMES DAILY X 5 DAYS THEN BACK TO HOME REGIMEN AS NEEDED. 1 Inhaler 0  . mometasone-formoterol (DULERA) 200-5 MCG/ACT AERO Inhale 2 puffs into the lungs 2 (two) times a day. 8.8 g 5  . omeprazole (PRILOSEC)  40 MG capsule TAKE 1 CAPSULE(40 MG) BY MOUTH DAILY 90 capsule 3  . ondansetron (ZOFRAN ODT) 4 MG disintegrating tablet Take 1 tablet (4 mg total) by mouth every 8 (eight) hours as needed for nausea or vomiting. 30 tablet 1  . oxyCODONE-acetaminophen (PERCOCET) 10-325 MG tablet Take 1 tablet by mouth every 8 (eight) hours as needed for pain.   0  . predniSONE (DELTASONE) 10 MG tablet 3 tabs by mouth per day for 3 days,2tabs per day for 3 days,1tab per day for 3 days 18 tablet 0  . rizatriptan (MAXALT-MLT) 10 MG disintegrating tablet Take 1 tablet earliest onset of migraine.  May repeat in 2 hours if needed.  Maximum 2 tablets in 24 hours (Patient taking differently: Take 10 mg by mouth every 2 (two) hours as needed for migraine. Take 1 tablet earliest onset of migraine.  May repeat in 2 hours if needed.  Maximum 2 tablets in 24 hours) 9 tablet 3  . rosuvastatin (CRESTOR) 40 MG tablet TAKE 1 TABLET(40 MG) BY MOUTH DAILY 90 tablet 2  . sertraline (ZOLOFT) 100 MG tablet TAKE 2 TABLETS(200 MG) BY MOUTH DAILY 180 tablet 3  . SYMBICORT 160-4.5 MCG/ACT inhaler Inhale 2 puffs into the  lungs 2 (two) times daily.    Marland Kitchen telmisartan (MICARDIS) 40 MG tablet Take 40 mg by mouth daily.    . TURMERIC PO Take 1 tablet by mouth daily.    . vitamin B-12 (CYANOCOBALAMIN) 1000 MCG tablet Take 1,000 mcg by mouth daily.    . Vitamin D, Ergocalciferol, (DRISDOL) 1.25 MG (50000 UNIT) CAPS capsule Take 1 capsule (50,000 Units total) by mouth every 7 (seven) days. 12 capsule 0  . zonisamide (ZONEGRAN) 100 MG capsule TAKE 1 CAPSULE(100 MG) BY MOUTH DAILY 30 capsule 1   No current facility-administered medications on file prior to visit.    Observations/Objective: Alert, NAD, appropriate mood and affect, resps normal, cn 2-12 intact, moves all 4s, no visible rash or swelling Lab Results  Component Value Date   WBC 9.6 11/20/2019   HGB 12.9 11/20/2019   HCT 39.8 11/20/2019   PLT 326 11/20/2019   GLUCOSE 157 (H) 01/24/2020   CHOL 145 01/24/2020   TRIG 130.0 01/24/2020   HDL 47.20 01/24/2020   LDLDIRECT 135.0 07/05/2011   LDLCALC 72 01/24/2020   ALT 11 01/24/2020   AST 14 01/24/2020   NA 138 01/24/2020   K 3.7 01/24/2020   CL 106 01/24/2020   CREATININE 0.70 01/24/2020   BUN 8 01/24/2020   CO2 24 01/24/2020   TSH 1.60 07/27/2019   INR 1.3 11/17/2012   HGBA1C 6.3 01/24/2020   MICROALBUR <0.7 07/27/2019   Assessment and Plan: See notes  Follow Up Instructions: See notes   I discussed the assessment and treatment plan with the patient. The patient was provided an opportunity to ask questions and all were answered. The patient agreed with the plan and demonstrated an understanding of the instructions.   The patient was advised to call back or seek an in-person evaluation if the symptoms worsen or if the condition fails to improve as anticipated   Cathlean Cower, MD

## 2020-09-25 DIAGNOSIS — Z79899 Other long term (current) drug therapy: Secondary | ICD-10-CM | POA: Diagnosis not present

## 2020-09-25 DIAGNOSIS — M47817 Spondylosis without myelopathy or radiculopathy, lumbosacral region: Secondary | ICD-10-CM | POA: Diagnosis not present

## 2020-09-25 DIAGNOSIS — D869 Sarcoidosis, unspecified: Secondary | ICD-10-CM | POA: Diagnosis not present

## 2020-09-25 DIAGNOSIS — Z79891 Long term (current) use of opiate analgesic: Secondary | ICD-10-CM | POA: Diagnosis not present

## 2020-09-25 DIAGNOSIS — M79606 Pain in leg, unspecified: Secondary | ICD-10-CM | POA: Diagnosis not present

## 2020-09-25 DIAGNOSIS — M069 Rheumatoid arthritis, unspecified: Secondary | ICD-10-CM | POA: Diagnosis not present

## 2020-09-25 DIAGNOSIS — G894 Chronic pain syndrome: Secondary | ICD-10-CM | POA: Diagnosis not present

## 2020-09-28 ENCOUNTER — Encounter: Payer: Self-pay | Admitting: Internal Medicine

## 2020-09-28 ENCOUNTER — Other Ambulatory Visit: Payer: Self-pay | Admitting: Neurology

## 2020-09-28 DIAGNOSIS — U071 COVID-19: Secondary | ICD-10-CM | POA: Insufficient documentation

## 2020-09-28 NOTE — Assessment & Plan Note (Signed)
stable overall by history and exam, recent data reviewed with pt, and pt to continue medical treatment as before,  to f/u any worsening symptoms or concerns  

## 2020-09-28 NOTE — Assessment & Plan Note (Signed)
stable overall by history and exam, recent data reviewed with pt, and pt to continue medical treatment as before,  to f/u any worsening symptoms or concerns Lab Results  Component Value Date   HGBA1C 6.3 01/24/2020

## 2020-09-28 NOTE — Patient Instructions (Signed)
Please continue all other medications as before, and refills have been done if requested.  Please have the pharmacy call with any other refills you may need.  Please continue your efforts at being more active, low cholesterol diet, and weight control.  Please keep your appointments with your specialists as you may have planned     

## 2020-09-28 NOTE — Assessment & Plan Note (Addendum)
stable overall by history and exam, recent data reviewed with pt, and pt to continue medical treatment as before,  to f/u any worsening symptoms or concerns, cont same tx  I spent 31 minutes in preparing to see the patient by review of recent labs, imaging and procedures, obtaining and reviewing separately obtained history, communicating with the patient and family or caregiver, ordering medications, tests or procedures, and documenting clinical information in the EHR including the differential Dx, treatment, and any further evaluation and other management of covid infection, copd, dm, anxiety

## 2020-09-30 DIAGNOSIS — M5136 Other intervertebral disc degeneration, lumbar region: Secondary | ICD-10-CM | POA: Diagnosis not present

## 2020-09-30 DIAGNOSIS — M5417 Radiculopathy, lumbosacral region: Secondary | ICD-10-CM | POA: Diagnosis not present

## 2020-09-30 DIAGNOSIS — M47817 Spondylosis without myelopathy or radiculopathy, lumbosacral region: Secondary | ICD-10-CM | POA: Diagnosis not present

## 2020-10-01 ENCOUNTER — Telehealth (INDEPENDENT_AMBULATORY_CARE_PROVIDER_SITE_OTHER): Payer: BC Managed Care – PPO | Admitting: Internal Medicine

## 2020-10-01 ENCOUNTER — Other Ambulatory Visit: Payer: Self-pay

## 2020-10-01 DIAGNOSIS — R112 Nausea with vomiting, unspecified: Secondary | ICD-10-CM | POA: Diagnosis not present

## 2020-10-01 DIAGNOSIS — R062 Wheezing: Secondary | ICD-10-CM

## 2020-10-01 DIAGNOSIS — U071 COVID-19: Secondary | ICD-10-CM | POA: Diagnosis not present

## 2020-10-01 MED ORDER — PREDNISONE 10 MG PO TABS
ORAL_TABLET | ORAL | 0 refills | Status: DC
Start: 2020-10-01 — End: 2020-11-26

## 2020-10-01 NOTE — Progress Notes (Addendum)
Patient ID: Nicole Melendez, female   DOB: 1976/10/06, 44 y.o.   MRN: [id number]  Virtual Visit via Video Note  I connected with Nicole Melendez on 10/01/20 at  9:20 AM EST by a video enabled telemedicine application and verified that I am speaking with the correct person using two identifiers.  Location of all participants today: Patient: at home Provider: at office   I discussed the limitations of evaluation and management by telemedicine and the availability of in person appointments. The patient expressed understanding and agreed to proceed.  History of Present Illness: Here with low grade temp with nausea, decreased appetite, and marked ongoing fatigue, but diarrhea near resolved related to her recent covid infection. Pt denies chest pain, orthopnea, PND, increased LE swelling, palpitations, dizziness or syncope, but now with increased sob with wheezing and need for increased inhaler use in the past week,. Pt denies new neurological symptoms such as new headache, or facial or extremity weakness or numbness   Pt denies polydipsia, polyuria, Past Medical History:  Diagnosis Date  . Allergic rhinitis, cause unspecified   . Anemia   . Anxiety   . Depression   . Diabetes mellitus type II    steroid related, patient states "im no longer diabetic"  . Essential hypertension 08/08/2015  . GERD (gastroesophageal reflux disease)   . History of blood transfusion   . Hyperlipidemia   . Menorrhagia   . Migraine   . Morbid obesity ([place])   . Otitis media 06/28/2015  . Peptic ulcer disease   . Positive ANA (antinuclear antibody) 02/14/2012  . Sarcoid    including hand per rheumatology-Dr. Amil Amen  . Sarcoidosis of lung ([place])   . Shortness of breath    on exertion  . Varicose veins with pain    Past Surgical History:  Procedure Laterality Date  . ABDOMINAL HYSTERECTOMY    . COLONOSCOPY WITH PROPOFOL N/A 10/19/2016   Procedure: COLONOSCOPY WITH PROPOFOL;  Surgeon: Mauri Pole, MD;   Location: [facility];  Service: Endoscopy;  Laterality: N/A;  . ESOPHAGOGASTRODUODENOSCOPY (EGD) WITH PROPOFOL N/A 10/19/2016   Procedure: ESOPHAGOGASTRODUODENOSCOPY (EGD) WITH PROPOFOL;  Surgeon: Mauri Pole, MD;  Location: [facility];  Service: Endoscopy;  Laterality: N/A;  . HERNIA MESH REMOVAL  02/2013  . uterine ablation  03/2010  . WISDOM TOOTH EXTRACTION      reports that she quit smoking about 7 years ago. Her smoking use included cigarettes. She has a 20.00 pack-year smoking history. She has never used smokeless tobacco. She reports that she does not drink alcohol and does not use drugs. family history includes Allergies in her mother; Bone cancer in her maternal aunt; Breast cancer in her maternal aunt; Diabetes in her father and mother; Heart attack in her mother; Heart disease in her father; Hypertension in an other family member; Lung cancer in her maternal aunt; Ovarian cancer in her maternal aunt; Rheum arthritis in her father; Stroke in her father and maternal uncle. Allergies  Allergen Reactions  . Azithromycin Hives    (z pak) hives  . Bee Venom Anaphylaxis  . Flagyl [Metronidazole] Hives and Shortness Of Breath  . Penicillins Shortness Of Breath and Swelling    Has patient had a PCN reaction causing immediate rash, facial/tongue/throat swelling, SOB or lightheadedness with hypotension: Yes Has patient had a PCN reaction causing severe rash involving mucus membranes or skin necrosis: No Has patient had a PCN reaction that required hospitalization No Has patient had a PCN reaction occurring within  the last 10 years: No If all of the above answers are "NO", then may proceed with Cephalosporin use.  REACTION: swelling and difficulty breathing  . Shellfish Allergy Anaphylaxis  . Klonopin [Clonazepam] Other (See Comments)    Memory difficulty   Current Outpatient Medications on File Prior to Visit  Medication Sig Dispense Refill  . acetaminophen (TYLENOL) 650 MG CR  tablet Take 650 mg by mouth every 8 (eight) hours as needed for pain.    Marland Kitchen albuterol (VENTOLIN HFA) 108 (90 Base) MCG/ACT inhaler INHALE 2 PUFFS INTO THE LUNGS EVERY 4 HOURS AS NEEDED FOR WHEEZING OR SHORTNESS OF BREATH 8.5 g 0  . ALPRAZolam (XANAX) 0.5 MG tablet TAKE 1 TABLET(0.5 MG) BY MOUTH THREE TIMES DAILY AS NEEDED 90 tablet 2  . azelastine (ASTELIN) 0.1 % nasal spray Place 2 sprays into both nostrils 2 (two) times daily. Use in each nostril as directed 30 mL 12  . Blood Glucose Monitoring Suppl (ONETOUCH VERIO) w/Device KIT Use as directed daily E11.9 1 kit 0  . celecoxib (CELEBREX) 200 MG capsule Take 1 capsule (200 mg total) by mouth 2 (two) times daily as needed. 60 capsule 2  . cetirizine (ZYRTEC) 10 MG tablet TAKE 1 TABLET BY MOUTH DAILY 90 tablet 1  . colchicine 0.6 MG tablet Take 1 tablet (0.6 mg total) by mouth daily. 60 tablet 5  . cyclobenzaprine (FLEXERIL) 5 MG tablet TAKE 1 TABLET(5 MG) BY MOUTH THREE TIMES DAILY AS NEEDED FOR MUSCLE SPASMS 40 tablet 1  . Diclofenac Sodium (PENNSAID) 2 % SOLN Apply 1 pump twice daily as needed. (Patient taking differently: Apply 1 Pump topically 2 (two) times daily. Apply 1 pump twice daily) 112 g 3  . diphenoxylate-atropine (LOMOTIL) 2.5-0.025 MG tablet Take 1 tablet by mouth 4 (four) times daily as needed for diarrhea or loose stools. 30 tablet 0  . doxycycline (VIBRA-TABS) 100 MG tablet Take 1 tablet (100 mg total) by mouth 2 (two) times daily. 20 tablet 0  . fluconazole (DIFLUCAN) 150 MG tablet 1 tab by mouth every 3 days as needed 2 tablet 1  . fluticasone (FLONASE) 50 MCG/ACT nasal spray Place 2 sprays into both nostrils daily. 16 g 0  . furosemide (LASIX) 40 MG tablet 1 tab by mouth every day in the AM and then 1 as needed only for persistent swelling in the PM (Patient not taking: Reported on 03/07/2020) 60 tablet 11  . gabapentin (NEURONTIN) 300 MG capsule TAKE 2 CAPSULES(600 MG) BY MOUTH THREE TIMES DAILY 270 capsule 5  . glucose blood  (ONETOUCH VERIO) test strip USE AS DIRECTED THREE TIMES DAILY 100 strip 11  . insulin aspart (NOVOLOG) 100 UNIT/ML FlexPen 12 units four times per day E11.9 15 mL 11  . Insulin Pen Needle (BD PEN NEEDLE NANO U/F) 32G X 4 MM MISC Use as directed three times daily E11.9 300 each 3  . levalbuterol (XOPENEX HFA) 45 MCG/ACT inhaler Inhale 1-2 puffs into the lungs every 8 (eight) hours as needed for wheezing or shortness of breath. USE 2 PUFFS 3 TIMES DAILY X 5 DAYS THEN BACK TO HOME REGIMEN AS NEEDED. 1 Inhaler 0  . mometasone-formoterol (DULERA) 200-5 MCG/ACT AERO Inhale 2 puffs into the lungs 2 (two) times a day. 8.8 g 5  . omeprazole (PRILOSEC) 40 MG capsule TAKE 1 CAPSULE(40 MG) BY MOUTH DAILY 90 capsule 3  . ondansetron (ZOFRAN ODT) 4 MG disintegrating tablet Take 1 tablet (4 mg total) by mouth every 8 (eight) hours as  needed for nausea or vomiting. 30 tablet 1  . oxyCODONE-acetaminophen (PERCOCET) 10-325 MG tablet Take 1 tablet by mouth every 8 (eight) hours as needed for pain.   0  . rizatriptan (MAXALT-MLT) 10 MG disintegrating tablet Take 1 tablet earliest onset of migraine.  May repeat in 2 hours if needed.  Maximum 2 tablets in 24 hours (Patient taking differently: Take 10 mg by mouth every 2 (two) hours as needed for migraine. Take 1 tablet earliest onset of migraine.  May repeat in 2 hours if needed.  Maximum 2 tablets in 24 hours) 9 tablet 3  . rosuvastatin (CRESTOR) 40 MG tablet TAKE 1 TABLET(40 MG) BY MOUTH DAILY 90 tablet 2  . sertraline (ZOLOFT) 100 MG tablet TAKE 2 TABLETS(200 MG) BY MOUTH DAILY 180 tablet 3  . SYMBICORT 160-4.5 MCG/ACT inhaler Inhale 2 puffs into the lungs 2 (two) times daily.    Marland Kitchen telmisartan (MICARDIS) 40 MG tablet Take 40 mg by mouth daily.    . TURMERIC PO Take 1 tablet by mouth daily.    . vitamin B-12 (CYANOCOBALAMIN) 1000 MCG tablet Take 1,000 mcg by mouth daily.    . Vitamin D, Ergocalciferol, (DRISDOL) 1.25 MG (50000 UNIT) CAPS capsule Take 1 capsule (50,000  Units total) by mouth every 7 (seven) days. 12 capsule 0  . zonisamide (ZONEGRAN) 100 MG capsule TAKE 1 CAPSULE(100 MG) BY MOUTH DAILY 30 capsule 1   No current facility-administered medications on file prior to visit.    Observations/Objective: Alert, NAD, appropriate mood and affect, resps normal, cn 2-12 intact, moves all 4s, no visible rash or swelling Lab Results  Component Value Date   WBC 9.6 11/20/2019   HGB 12.9 11/20/2019   HCT 39.8 11/20/2019   PLT 326 11/20/2019   GLUCOSE 157 (H) 01/24/2020   CHOL 145 01/24/2020   TRIG 130.0 01/24/2020   HDL 47.20 01/24/2020   LDLDIRECT 135.0 07/05/2011   LDLCALC 72 01/24/2020   ALT 11 01/24/2020   AST 14 01/24/2020   NA 138 01/24/2020   K 3.7 01/24/2020   CL 106 01/24/2020   CREATININE 0.70 01/24/2020   BUN 8 01/24/2020   CO2 24 01/24/2020   TSH 1.60 07/27/2019   INR 1.3 11/17/2012   HGBA1C 6.3 01/24/2020   MICROALBUR <0.7 07/27/2019   Assessment and Plan: See notes  Follow Up Instructions: Seen notes   I discussed the assessment and treatment plan with the patient. The patient was provided an opportunity to ask questions and all were answered. The patient agreed with the plan and demonstrated an understanding of the instructions.   The patient was advised to call back or seek an in-person evaluation if the symptoms worsen or if the condition fails to improve as anticipated.   Cathlean Cower, MD

## 2020-10-04 ENCOUNTER — Encounter: Payer: Self-pay | Admitting: Internal Medicine

## 2020-10-04 NOTE — Assessment & Plan Note (Signed)
With marked post covid fatigue, continue to follow

## 2020-10-04 NOTE — Patient Instructions (Signed)
Please take all new medication as prescribed 

## 2020-10-04 NOTE — Assessment & Plan Note (Addendum)
With increased inhaler use, but no wt gain or leg swelling per pt, suspect bronchospasm, for predpac asd,  to f/u any worsening symptoms or concerns;  Also for off work extension to Oct 06 2020  I spent 31 minutes in preparing to see the patient by review of recent labs, imaging and procedures, obtaining and reviewing separately obtained history, communicating with the patient and family or caregiver, ordering medications, tests or procedures, and documenting clinical information in the EHR including the differential Dx, treatment, and any further evaluation and other management of wheezing, n/v, covid infection

## 2020-10-04 NOTE — Assessment & Plan Note (Signed)
Vomiting and diarreha resolved, to continue zofran prn

## 2020-10-07 NOTE — Telephone Encounter (Signed)
   Patient requesting a call to discuss forms Advised patient forms updated and faxed today

## 2020-10-08 NOTE — Telephone Encounter (Signed)
     Patient requesting start date of absence to be updated to 11/17

## 2020-10-09 ENCOUNTER — Ambulatory Visit: Payer: BC Managed Care – PPO | Admitting: Internal Medicine

## 2020-10-14 NOTE — Telephone Encounter (Signed)
Forms have been completed & Placed in providers box to review and sign.  

## 2020-10-15 ENCOUNTER — Ambulatory Visit (INDEPENDENT_AMBULATORY_CARE_PROVIDER_SITE_OTHER): Payer: BC Managed Care – PPO

## 2020-10-15 ENCOUNTER — Ambulatory Visit (INDEPENDENT_AMBULATORY_CARE_PROVIDER_SITE_OTHER): Payer: BC Managed Care – PPO | Admitting: Internal Medicine

## 2020-10-15 ENCOUNTER — Encounter: Payer: Self-pay | Admitting: Internal Medicine

## 2020-10-15 ENCOUNTER — Other Ambulatory Visit: Payer: Self-pay

## 2020-10-15 VITALS — BP 130/80 | HR 87 | Temp 98.1°F | Ht 67.0 in | Wt 328.0 lb

## 2020-10-15 DIAGNOSIS — D509 Iron deficiency anemia, unspecified: Secondary | ICD-10-CM

## 2020-10-15 DIAGNOSIS — M545 Low back pain, unspecified: Secondary | ICD-10-CM

## 2020-10-15 DIAGNOSIS — G8929 Other chronic pain: Secondary | ICD-10-CM

## 2020-10-15 DIAGNOSIS — E1140 Type 2 diabetes mellitus with diabetic neuropathy, unspecified: Secondary | ICD-10-CM | POA: Diagnosis not present

## 2020-10-15 DIAGNOSIS — E538 Deficiency of other specified B group vitamins: Secondary | ICD-10-CM

## 2020-10-15 DIAGNOSIS — M5416 Radiculopathy, lumbar region: Secondary | ICD-10-CM | POA: Diagnosis not present

## 2020-10-15 DIAGNOSIS — R296 Repeated falls: Secondary | ICD-10-CM

## 2020-10-15 DIAGNOSIS — E559 Vitamin D deficiency, unspecified: Secondary | ICD-10-CM

## 2020-10-15 DIAGNOSIS — I10 Essential (primary) hypertension: Secondary | ICD-10-CM

## 2020-10-15 DIAGNOSIS — Z0001 Encounter for general adult medical examination with abnormal findings: Secondary | ICD-10-CM

## 2020-10-15 DIAGNOSIS — E7849 Other hyperlipidemia: Secondary | ICD-10-CM

## 2020-10-15 DIAGNOSIS — Z0000 Encounter for general adult medical examination without abnormal findings: Secondary | ICD-10-CM | POA: Diagnosis not present

## 2020-10-15 DIAGNOSIS — Z794 Long term (current) use of insulin: Secondary | ICD-10-CM

## 2020-10-15 LAB — CBC WITH DIFFERENTIAL/PLATELET
Basophils Absolute: 0.1 10*3/uL (ref 0.0–0.1)
Basophils Relative: 0.7 % (ref 0.0–3.0)
Eosinophils Absolute: 0.2 10*3/uL (ref 0.0–0.7)
Eosinophils Relative: 1.7 % (ref 0.0–5.0)
HCT: 42.1 % (ref 36.0–46.0)
Hemoglobin: 13.9 g/dL (ref 12.0–15.0)
Lymphocytes Relative: 22.6 % (ref 12.0–46.0)
Lymphs Abs: 2.9 10*3/uL (ref 0.7–4.0)
MCHC: 33 g/dL (ref 30.0–36.0)
MCV: 86.5 fl (ref 78.0–100.0)
Monocytes Absolute: 0.6 10*3/uL (ref 0.1–1.0)
Monocytes Relative: 4.4 % (ref 3.0–12.0)
Neutro Abs: 8.9 10*3/uL — ABNORMAL HIGH (ref 1.4–7.7)
Neutrophils Relative %: 70.6 % (ref 43.0–77.0)
Platelets: 266 10*3/uL (ref 150.0–400.0)
RBC: 4.87 Mil/uL (ref 3.87–5.11)
RDW: 14.6 % (ref 11.5–15.5)
WBC: 12.6 10*3/uL — ABNORMAL HIGH (ref 4.0–10.5)

## 2020-10-15 LAB — HEPATIC FUNCTION PANEL
ALT: 10 U/L (ref 0–35)
AST: 9 U/L (ref 0–37)
Albumin: 4 g/dL (ref 3.5–5.2)
Alkaline Phosphatase: 134 U/L — ABNORMAL HIGH (ref 39–117)
Bilirubin, Direct: 0.1 mg/dL (ref 0.0–0.3)
Total Bilirubin: 0.5 mg/dL (ref 0.2–1.2)
Total Protein: 7.2 g/dL (ref 6.0–8.3)

## 2020-10-15 LAB — BASIC METABOLIC PANEL
BUN: 11 mg/dL (ref 6–23)
CO2: 26 mEq/L (ref 19–32)
Calcium: 9.3 mg/dL (ref 8.4–10.5)
Chloride: 104 mEq/L (ref 96–112)
Creatinine, Ser: 0.87 mg/dL (ref 0.40–1.20)
GFR: 79.7 mL/min (ref >60.00)
Glucose, Bld: 113 mg/dL — ABNORMAL HIGH (ref 70–99)
Potassium: 4.1 mEq/L (ref 3.5–5.1)
Sodium: 138 mEq/L (ref 135–145)

## 2020-10-15 LAB — MICROALBUMIN / CREATININE URINE RATIO
Creatinine,U: 103.5 mg/dL
Microalb Creat Ratio: 0.7 mg/g (ref 0.0–30.0)
Microalb, Ur: 0.7 mg/dL (ref 0.0–1.9)

## 2020-10-15 LAB — LIPID PANEL
Cholesterol: 155 mg/dL (ref 0–200)
HDL: 58.5 mg/dL (ref >39.00)
LDL Cholesterol: 79 mg/dL (ref 0–99)
NonHDL: 96.42
Total CHOL/HDL Ratio: 3
Triglycerides: 88 mg/dL (ref 0.0–149.0)
VLDL: 17.6 mg/dL (ref 0.0–40.0)

## 2020-10-15 LAB — HEMOGLOBIN A1C: Hgb A1c MFr Bld: 6.9 % — ABNORMAL HIGH (ref 4.6–6.5)

## 2020-10-15 LAB — TSH: TSH: 1.6 u[IU]/mL (ref 0.35–4.50)

## 2020-10-15 LAB — VITAMIN D 25 HYDROXY (VIT D DEFICIENCY, FRACTURES): VITD: 23.66 ng/mL — ABNORMAL LOW (ref 30.00–100.00)

## 2020-10-15 LAB — VITAMIN B12: Vitamin B-12: 283 pg/mL (ref 211–911)

## 2020-10-15 MED ORDER — CYCLOBENZAPRINE HCL 5 MG PO TABS
ORAL_TABLET | ORAL | 2 refills | Status: DC
Start: 2020-10-15 — End: 2020-12-05

## 2020-10-15 MED ORDER — KETOROLAC TROMETHAMINE 30 MG/ML IJ SOLN
30.0000 mg | Freq: Once | INTRAMUSCULAR | Status: AC
  Administered 2020-10-15: 30 mg via INTRAMUSCULAR

## 2020-10-15 NOTE — Progress Notes (Deleted)
   Subjective:    Patient ID: Nicole Melendez, female    DOB: [birth year], 44 y.o.   MRN: [id number]  HPI  Has seen neurology.    Review of Systems     Objective:   Physical Exam        Assessment & Plan:

## 2020-10-15 NOTE — Patient Instructions (Signed)
You had the pain shot today (toradol)  Please take all new medication as prescribed - the generic flexeril muscle relaxer as needed  You are given the work note off to Oct 27, 2020  Please continue all other medications as before, and refills have been done if requested.  Please have the pharmacy call with any other refills you may need.  Please continue your efforts at being more active, low cholesterol diet, and weight control.  You are otherwise up to date with prevention measures today.  Please keep your appointments with your specialists as you may have planned - pain management on Oct 22, 2020  Please go to the XRAY Department in the first floor for the x-ray testing  Please go to the LAB at the blood drawing area for the tests to be done  You will be contacted by phone if any changes need to be made immediately.  Otherwise, you will receive a letter about your results with an explanation, but please check with MyChart first.  Please remember to sign up for MyChart if you have not done so, as this will be important to you in the future with finding out test results, communicating by private email, and scheduling acute appointments online when needed.  Please make an Appointment to return in 6 months, or sooner if needed

## 2020-10-16 LAB — URINALYSIS, ROUTINE W REFLEX MICROSCOPIC
Bilirubin Urine: NEGATIVE
Ketones, ur: NEGATIVE
Leukocytes,Ua: NEGATIVE
Nitrite: NEGATIVE
Specific Gravity, Urine: 1.025 (ref 1.000–1.030)
Total Protein, Urine: NEGATIVE
Urine Glucose: NEGATIVE
Urobilinogen, UA: 1 (ref 0.0–1.0)
pH: 6 (ref 5.0–8.0)

## 2020-10-19 ENCOUNTER — Encounter: Payer: Self-pay | Admitting: Internal Medicine

## 2020-10-21 ENCOUNTER — Encounter: Payer: Self-pay | Admitting: Internal Medicine

## 2020-10-21 ENCOUNTER — Other Ambulatory Visit: Payer: Self-pay | Admitting: Internal Medicine

## 2020-10-21 DIAGNOSIS — M25551 Pain in right hip: Secondary | ICD-10-CM | POA: Diagnosis not present

## 2020-10-21 DIAGNOSIS — E559 Vitamin D deficiency, unspecified: Secondary | ICD-10-CM | POA: Insufficient documentation

## 2020-10-21 NOTE — Assessment & Plan Note (Signed)
O/w stable, cont to follow pain/ortho on current tx, gave work note for out to Oct 27, 2020

## 2020-10-21 NOTE — Assessment & Plan Note (Signed)
To start oral replacement vit d3 2000 u qd

## 2020-10-21 NOTE — Assessment & Plan Note (Signed)
Lab Results  Component Value Date   IRON 82 07/27/2019   TIBC 363 11/17/2012   FERRITIN 32.0 12/29/2018  stable overall by history and exam, recent data reviewed with pt, and pt to continue medical treatment as before,  to f/u any worsening symptoms or concerns, no recent bleeding unusual

## 2020-10-21 NOTE — Assessment & Plan Note (Signed)
Encouraged pt for planned f/u with Dr Lang Snow on dec 29

## 2020-10-21 NOTE — Progress Notes (Signed)
Established Patient Office Visit  Subjective:  Patient ID: Nicole Melendez, female    DOB: 06-06-1976  Age: 44 y.o. MRN: [id number]       Chief Complaint: (concise statement describing the symptom, problem, condition, diagnosis, physician recommended return, or other factor as reason for encounter): wellness and dm, htn, hld, right low back pain/radiculopathy/chronic pain, iron deficiency, and recurrent falls , vit d def       HPI:  Nicole Melendez is a 44 y.o. female here for wellness overall doing ok;  Pt denies Chest pain, worsening SOB, DOE, wheezing, orthopnea, PND, worsening LE edema, palpitations, dizziness or syncope.  Pt denies neurological change such as new headache, facial weakness.  Pt denies polydipsia, polyuria. Pt states overall good compliance with treatment and medications, good medication tolerability, and has been trying to follow appropriate diet.  Pt denies worsening depressive symptoms, suicidal ideation or panic, but does have ongoing anxiety chronic persistent . Denies fever, night sweats, wt loss, loss of appetite, or other constitutional symptoms.  Pt states good ability with ADL's, bu mod to high recent fall risk, home safety reviewed and adequate, no other significant changes in hearing or vision, and not active with exercise.        Wt Readings from Last 3 Encounters:  10/15/20 (!) 328 lb (148.8 kg)  03/07/20 (!) 330 lb (149.7 kg)  02/26/20 (!) 336 lb (152.4 kg)   BP Readings from Last 3 Encounters:  10/15/20 130/80  09/19/20 (!) 145/88  02/26/20 (!) 126/96        Also c/o acute problem of recurrent falls x 8 with acute on chronic right low back pain mod to severe x 3 days fortunately without worsening RLE symptoms of weakness, numbness, but states balance overall is worsening, followed per pain management and ortho, is s/p ESI 2 wks ago per pain management, now using cane more in the home, Pt denies bowel or bladder change, fever, wt loss.  Has f/u dec 29 with Dr  Kandra Nicolas.  Asking today for further time off work.  Not made better or worse by anything else.  Past Medical History:  Diagnosis Date  . Allergic rhinitis, cause unspecified   . Anemia   . Anxiety   . Depression   . Diabetes mellitus type II    steroid related, patient states "im no longer diabetic"  . Essential hypertension 08/08/2015  . GERD (gastroesophageal reflux disease)   . History of blood transfusion   . Hyperlipidemia   . Menorrhagia   . Migraine   . Morbid obesity ([place])   . Otitis media 06/28/2015  . Peptic ulcer disease   . Positive ANA (antinuclear antibody) 02/14/2012  . Sarcoid    including hand per rheumatology-Dr. Amil Amen  . Sarcoidosis of lung ([place])   . Shortness of breath    on exertion  . Varicose veins with pain    Past Surgical History:  Procedure Laterality Date  . ABDOMINAL HYSTERECTOMY    . COLONOSCOPY WITH PROPOFOL N/A 10/19/2016   Procedure: COLONOSCOPY WITH PROPOFOL;  Surgeon: Mauri Pole, MD;  Location: [facility];  Service: Endoscopy;  Laterality: N/A;  . ESOPHAGOGASTRODUODENOSCOPY (EGD) WITH PROPOFOL N/A 10/19/2016   Procedure: ESOPHAGOGASTRODUODENOSCOPY (EGD) WITH PROPOFOL;  Surgeon: Mauri Pole, MD;  Location: [facility];  Service: Endoscopy;  Laterality: N/A;  . HERNIA MESH REMOVAL  02/2013  . uterine ablation  03/2010  . WISDOM TOOTH EXTRACTION      reports that she quit smoking about  7 years ago. Her smoking use included cigarettes. She has a 20.00 pack-year smoking history. She has never used smokeless tobacco. She reports that she does not drink alcohol and does not use drugs. family history includes Allergies in her mother; Bone cancer in her maternal aunt; Breast cancer in her maternal aunt; Diabetes in her father and mother; Heart attack in her mother; Heart disease in her father; Hypertension in an other family member; Lung cancer in her maternal aunt; Ovarian cancer in her maternal aunt; Rheum arthritis in her father;  Stroke in her father and maternal uncle. Allergies  Allergen Reactions  . Azithromycin Hives    (z pak) hives  . Bee Venom Anaphylaxis  . Flagyl [Metronidazole] Hives and Shortness Of Breath  . Penicillins Shortness Of Breath and Swelling    Has patient had a PCN reaction causing immediate rash, facial/tongue/throat swelling, SOB or lightheadedness with hypotension: Yes Has patient had a PCN reaction causing severe rash involving mucus membranes or skin necrosis: No Has patient had a PCN reaction that required hospitalization No Has patient had a PCN reaction occurring within the last 10 years: No If all of the above answers are "NO", then may proceed with Cephalosporin use.  REACTION: swelling and difficulty breathing  . Shellfish Allergy Anaphylaxis  . Klonopin [Clonazepam] Other (See Comments)    Memory difficulty   Current Outpatient Medications on File Prior to Visit  Medication Sig Dispense Refill  . acetaminophen (TYLENOL) 650 MG CR tablet Take 650 mg by mouth every 8 (eight) hours as needed for pain.    Marland Kitchen albuterol (VENTOLIN HFA) 108 (90 Base) MCG/ACT inhaler INHALE 2 PUFFS INTO THE LUNGS EVERY 4 HOURS AS NEEDED FOR WHEEZING OR SHORTNESS OF BREATH 8.5 g 0  . ALPRAZolam (XANAX) 0.5 MG tablet TAKE 1 TABLET(0.5 MG) BY MOUTH THREE TIMES DAILY AS NEEDED 90 tablet 2  . azelastine (ASTELIN) 0.1 % nasal spray Place 2 sprays into both nostrils 2 (two) times daily. Use in each nostril as directed 30 mL 12  . Blood Glucose Monitoring Suppl (ONETOUCH VERIO) w/Device KIT Use as directed daily E11.9 1 kit 0  . celecoxib (CELEBREX) 200 MG capsule Take 1 capsule (200 mg total) by mouth 2 (two) times daily as needed. 60 capsule 2  . cetirizine (ZYRTEC) 10 MG tablet TAKE 1 TABLET BY MOUTH DAILY 90 tablet 1  . colchicine 0.6 MG tablet Take 1 tablet (0.6 mg total) by mouth daily. 60 tablet 5  . Diclofenac Sodium (PENNSAID) 2 % SOLN Apply 1 pump twice daily as needed. (Patient taking differently:  Apply 1 Pump topically 2 (two) times daily. Apply 1 pump twice daily) 112 g 3  . diphenoxylate-atropine (LOMOTIL) 2.5-0.025 MG tablet Take 1 tablet by mouth 4 (four) times daily as needed for diarrhea or loose stools. 30 tablet 0  . doxycycline (VIBRA-TABS) 100 MG tablet Take 1 tablet (100 mg total) by mouth 2 (two) times daily. 20 tablet 0  . fluconazole (DIFLUCAN) 150 MG tablet 1 tab by mouth every 3 days as needed 2 tablet 1  . fluticasone (FLONASE) 50 MCG/ACT nasal spray Place 2 sprays into both nostrils daily. 16 g 0  . furosemide (LASIX) 40 MG tablet 1 tab by mouth every day in the AM and then 1 as needed only for persistent swelling in the PM 60 tablet 11  . glucose blood (ONETOUCH VERIO) test strip USE AS DIRECTED THREE TIMES DAILY 100 strip 11  . ibuprofen (ADVIL) 800 MG tablet Take  800 mg by mouth every 6 (six) hours as needed.    . insulin aspart (NOVOLOG) 100 UNIT/ML FlexPen 12 units four times per day E11.9 15 mL 11  . Insulin Pen Needle (BD PEN NEEDLE NANO U/F) 32G X 4 MM MISC Use as directed three times daily E11.9 300 each 3  . levalbuterol (XOPENEX HFA) 45 MCG/ACT inhaler Inhale 1-2 puffs into the lungs every 8 (eight) hours as needed for wheezing or shortness of breath. USE 2 PUFFS 3 TIMES DAILY X 5 DAYS THEN BACK TO HOME REGIMEN AS NEEDED. 1 Inhaler 0  . mometasone-formoterol (DULERA) 200-5 MCG/ACT AERO Inhale 2 puffs into the lungs 2 (two) times a day. 8.8 g 5  . nystatin (MYCOSTATIN) 100000 UNIT/ML suspension     . omeprazole (PRILOSEC) 40 MG capsule TAKE 1 CAPSULE(40 MG) BY MOUTH DAILY 90 capsule 3  . ondansetron (ZOFRAN ODT) 4 MG disintegrating tablet Take 1 tablet (4 mg total) by mouth every 8 (eight) hours as needed for nausea or vomiting. 30 tablet 1  . oxyCODONE-acetaminophen (PERCOCET) 10-325 MG tablet Take 1 tablet by mouth every 8 (eight) hours as needed for pain.   0  . predniSONE (DELTASONE) 10 MG tablet 3 tabs by mouth per day for 3 days,2tabs per day for 3 days,1tab  per day for 3 days 18 tablet 0  . rizatriptan (MAXALT-MLT) 10 MG disintegrating tablet Take 1 tablet earliest onset of migraine.  May repeat in 2 hours if needed.  Maximum 2 tablets in 24 hours (Patient taking differently: Take 10 mg by mouth every 2 (two) hours as needed for migraine. Take 1 tablet earliest onset of migraine.  May repeat in 2 hours if needed.  Maximum 2 tablets in 24 hours) 9 tablet 3  . rosuvastatin (CRESTOR) 40 MG tablet TAKE 1 TABLET(40 MG) BY MOUTH DAILY 90 tablet 2  . sertraline (ZOLOFT) 100 MG tablet TAKE 2 TABLETS(200 MG) BY MOUTH DAILY 180 tablet 3  . SYMBICORT 160-4.5 MCG/ACT inhaler Inhale 2 puffs into the lungs 2 (two) times daily.    Marland Kitchen telmisartan (MICARDIS) 40 MG tablet Take 40 mg by mouth daily.    . TURMERIC PO Take 1 tablet by mouth daily.    . vitamin B-12 (CYANOCOBALAMIN) 1000 MCG tablet Take 1,000 mcg by mouth daily.    Marland Kitchen zonisamide (ZONEGRAN) 100 MG capsule TAKE 1 CAPSULE(100 MG) BY MOUTH DAILY 30 capsule 1   No current facility-administered medications on file prior to visit.        ROS:  All others reviewed and negative.  Objective        PE:  BP 130/80 (BP Location: Left Arm, Patient Position: Sitting, Cuff Size: Large)   Pulse 87   Temp 98.1 F (36.7 C) (Oral)   Ht _0  (1.702 m)   Wt (!) 328 lb (148.8 kg)   LMP 07/17/2012   SpO2 97%   BMI 51.37 kg/m                 Constitutional: Pt appears in NAD               HENT: Head: NCAT.                Right Ear: External ear normal.                 Left Ear: External ear normal.                Eyes: . Pupils are  equal, round, and reactive to light. Conjunctivae and EOM are normal               Nose: without d/c or deformity               Neck: Neck supple. Gross normal ROM               Cardiovascular: Normal rate and regular rhythm.                 Pulmonary/Chest: Effort normal and breath sounds without rales or wheezing.                Abd:  Soft, NT, ND, + BS, no organomegaly                Neurological: Pt is alert. At baseline orientation, motor grossly intact except 4+/5 RLE motor               Skin: Skin is warm. No rashes, no other new lesions, LE edema  none               Psychiatric: Pt behavior is normal without agitation   Assessment/Plan:  Nicole Melendez is a 43 y.o. Black or African American [2] female with  has a past medical history of Allergic rhinitis, cause unspecified, Anemia, Anxiety, Depression, Diabetes mellitus type II, Essential hypertension (08/08/2015), GERD (gastroesophageal reflux disease), History of blood transfusion, Hyperlipidemia, Menorrhagia, Migraine, Morbid obesity ([place]), Otitis media (06/28/2015), Peptic ulcer disease, Positive ANA (antinuclear antibody) (02/14/2012), Sarcoid, Sarcoidosis of lung ([place]), Shortness of breath, and Varicose veins with pain.     Assessment Plan  See notes Labs reviewed for each problem: Lab Results  Component Value Date   WBC 12.6 (H) 10/15/2020   HGB 13.9 10/15/2020   HCT 42.1 10/15/2020   PLT 266.0 10/15/2020   GLUCOSE 113 (H) 10/15/2020   CHOL 155 10/15/2020   TRIG 88.0 10/15/2020   HDL 58.50 10/15/2020   LDLDIRECT 135.0 07/05/2011   LDLCALC 79 10/15/2020   ALT 10 10/15/2020   AST 9 10/15/2020   NA 138 10/15/2020   K 4.1 10/15/2020   CL 104 10/15/2020   CREATININE 0.87 10/15/2020   BUN 11 10/15/2020   CO2 26 10/15/2020   TSH 1.60 10/15/2020   INR 1.3 11/17/2012   HGBA1C 6.9 (H) 10/15/2020   MICROALBUR 0.7 10/15/2020    Micro: none  Cardiac tracings I have personally interpreted today:  none  Pertinent Radiological findings (summarize): 06/09/2019 MRI ls spine   I spent total 36 minutes in addition to wellness in caring for the patient for this visit:  1) by communicating with the patient during the visit  2) by review of pertinent vital sign data, physical examination and labs as documented in the assessment and plan  3) by review of pertinent imaging   4) by review of pertinent  procedures - ESI x 2 wks ago  5) by obtaining and reviewing separately obtained information from family/caretaker and Care Everywhere - none today  6) by ordering medications  7) by ordering tests  8) by documenting all of this clinical information in the EHR including the management of each problem noted today in assessment and plan   There are no preventive care reminders to display for this patient.  There are no preventive care reminders to display for this patient.   Problem List Items Addressed This Visit      High   Right low back pain  Acute on chronic today, for ls spine films, toradol 30 mg IM      Relevant Medications   ibuprofen (ADVIL) 800 MG tablet   cyclobenzaprine (FLEXERIL) 5 MG tablet   Other Relevant Orders   DG Lumbar Spine Complete (Completed)   Chronic pain    Encouraged pt for planned f/u with Dr Kandra Nicolas on dec 29      Relevant Medications   ibuprofen (ADVIL) 800 MG tablet   cyclobenzaprine (FLEXERIL) 5 MG tablet     Medium   Vitamin D deficiency    To start oral replacement vit d3 2000 u qd      Relevant Orders   VITAMIN D 25 Hydroxy (Vit-D Deficiency, Fractures) (Completed)   Right lumbar radiculopathy    O/w stable, cont to follow pain/ortho on current tx, gave work note for out to Oct 27, 2020      Relevant Medications   cyclobenzaprine (FLEXERIL) 5 MG tablet   Recurrent falls while walking    O/w declines PT for now,  to f/u any worsening symptoms or concerns, cont cane use      Relevant Orders   DG Lumbar Spine Complete (Completed)   Iron deficiency anemia    Lab Results  Component Value Date   IRON 82 07/27/2019   TIBC 363 11/17/2012   FERRITIN 32.0 12/29/2018  stable overall by history and exam, recent data reviewed with pt, and pt to continue medical treatment as before,  to f/u any worsening symptoms or concerns, no recent bleeding unusual      Hyperlipidemia    stable overall by history and exam, recent data  reviewed with pt, and pt to continue medical treatment as before,  to f/u any worsening symptoms or concerns Lab Results  Component Value Date   LDLCALC 79 10/15/2020        Essential hypertension    BP Readings from Last 3 Encounters:  10/15/20 130/80  09/19/20 (!) 145/88  02/26/20 (!) 126/96  stable overall by history and exam, recent data reviewed with pt, and pt to continue medical treatment as before,  to f/u any worsening symptoms or concerns       Diabetes mellitus with neuropathy ([place])    Lab Results  Component Value Date   HGBA1C 6.9 (H) 10/15/2020  stable overall by history and exam, recent data reviewed with pt, and pt to continue medical treatment as before,  to f/u any worsening symptoms or concerns       Relevant Orders   Microalbumin / creatinine urine ratio (Completed)   Hemoglobin A1c (Completed)   Lipid panel (Completed)   Hepatic function panel (Completed)   CBC with Differential/Platelet (Completed)   TSH (Completed)   Urinalysis, Routine w reflex microscopic (Completed)   Basic metabolic panel (Completed)     Unprioritized   Encounter for well adult exam with abnormal findings - Primary    Overall doing well, age appropriate education and counseling updated, referrals for preventative services and immunizations addressed, dietary and smoking counseling addressed, most recent labs reviewed.  I have personally reviewed and have noted:  1) the patient's medical and social history 2) The pt's use of alcohol, tobacco, and illicit drugs 3) The patient's current medications and supplements 4) Functional ability including ADL's, fall risk, home safety risk, hearing and visual impairment 5) Diet and physical activities 6) Evidence for depression or mood disorder 7) The patient's height, weight, and BMI have been recorded in the chart  I have made referrals,  and provided counseling and education based on review of the above        Other Visit Diagnoses     B12 deficiency       Relevant Orders   Vitamin B12 (Completed)      Meds ordered this encounter  Medications  . cyclobenzaprine (FLEXERIL) 5 MG tablet    Sig: TAKE 1 TABLET(5 MG) BY MOUTH THREE TIMES DAILY AS NEEDED FOR MUSCLE SPASMS    Dispense:  40 tablet    Refill:  2  . ketorolac (TORADOL) 30 MG/ML injection 30 mg    Follow-up: 6 mo   Cathlean Cower, MD 10/21/2020 1:29 PM [facility]

## 2020-10-21 NOTE — Assessment & Plan Note (Addendum)
O/w declines PT for now,  to f/u any worsening symptoms or concerns, cont cane use

## 2020-10-21 NOTE — Assessment & Plan Note (Signed)
stable overall by history and exam, recent data reviewed with pt, and pt to continue medical treatment as before,  to f/u any worsening symptoms or concerns Lab Results  Component Value Date   LDLCALC 79 10/15/2020

## 2020-10-21 NOTE — Telephone Encounter (Signed)
Ok forward to [employer]

## 2020-10-21 NOTE — Assessment & Plan Note (Signed)
Acute on chronic today, for ls spine films, toradol 30 mg IM

## 2020-10-21 NOTE — Assessment & Plan Note (Signed)
Lab Results  Component Value Date   HGBA1C 6.9 (H) 10/15/2020  stable overall by history and exam, recent data reviewed with pt, and pt to continue medical treatment as before,  to f/u any worsening symptoms or concerns

## 2020-10-21 NOTE — Assessment & Plan Note (Signed)
BP Readings from Last 3 Encounters:  10/15/20 130/80  09/19/20 (!) 145/88  02/26/20 (!) 126/96  stable overall by history and exam, recent data reviewed with pt, and pt to continue medical treatment as before,  to f/u any worsening symptoms or concerns

## 2020-10-21 NOTE — Assessment & Plan Note (Signed)

## 2020-10-21 NOTE — Telephone Encounter (Signed)
The original reason I believe was covid, then the last visit was due to back pain

## 2020-10-22 ENCOUNTER — Other Ambulatory Visit: Payer: Self-pay | Admitting: Family Medicine

## 2020-10-22 ENCOUNTER — Other Ambulatory Visit: Payer: Self-pay | Admitting: Internal Medicine

## 2020-10-22 DIAGNOSIS — M069 Rheumatoid arthritis, unspecified: Secondary | ICD-10-CM | POA: Diagnosis not present

## 2020-10-22 DIAGNOSIS — M5136 Other intervertebral disc degeneration, lumbar region: Secondary | ICD-10-CM | POA: Diagnosis not present

## 2020-10-22 DIAGNOSIS — M47817 Spondylosis without myelopathy or radiculopathy, lumbosacral region: Secondary | ICD-10-CM | POA: Diagnosis not present

## 2020-10-22 DIAGNOSIS — G894 Chronic pain syndrome: Secondary | ICD-10-CM | POA: Diagnosis not present

## 2020-10-23 ENCOUNTER — Encounter: Payer: Self-pay | Admitting: Internal Medicine

## 2020-10-27 NOTE — Telephone Encounter (Signed)
Ok with me 

## 2020-10-27 NOTE — Telephone Encounter (Signed)
Dr.John Are you ok with making the change from 12/20 to 12/17 of start date out of work?

## 2020-10-30 ENCOUNTER — Encounter (HOSPITAL_COMMUNITY): Payer: Self-pay | Admitting: Emergency Medicine

## 2020-10-30 ENCOUNTER — Ambulatory Visit (HOSPITAL_COMMUNITY)
Admission: EM | Admit: 2020-10-30 | Discharge: 2020-10-30 | Disposition: A | Discharge status: Home / Self Care | Payer: BC Managed Care – PPO | Attending: Family Medicine | Admitting: Family Medicine

## 2020-10-30 ENCOUNTER — Ambulatory Visit (INDEPENDENT_AMBULATORY_CARE_PROVIDER_SITE_OTHER): Payer: BC Managed Care – PPO

## 2020-10-30 ENCOUNTER — Other Ambulatory Visit: Payer: Self-pay

## 2020-10-30 DIAGNOSIS — M25521 Pain in right elbow: Secondary | ICD-10-CM

## 2020-10-30 DIAGNOSIS — M19021 Primary osteoarthritis, right elbow: Secondary | ICD-10-CM | POA: Diagnosis not present

## 2020-10-30 MED ORDER — TRIAMCINOLONE ACETONIDE 40 MG/ML IJ SUSP
40.0000 mg | Freq: Once | INTRAMUSCULAR | Status: AC
  Administered 2020-10-30: 40 mg via INTRAMUSCULAR

## 2020-10-30 MED ORDER — TRIAMCINOLONE ACETONIDE 40 MG/ML IJ SUSP
INTRAMUSCULAR | Status: AC
  Filled 2020-10-30: qty 1

## 2020-10-30 NOTE — ED Provider Notes (Signed)
[employer]    CSN: [id number] Arrival date & time: 10/30/20  1121      History   Chief Complaint Chief Complaint  Patient presents with  . Elbow Pain    HPI Nicole Melendez is a 45 y.o. female.   Here today for 3 day history of elbow pain, stiffness and some mild weakness, numbness, tingling. Denies any injury to the area though has developed a large bruise to medial elbow. Has had multiple falls lately but all onto bottom, she denies any to elbow. Denies mobility decrease, diffuse swelling, heat, fever. Followed by Pain Mgmt for chronic pain issues and hx of arthritis, positive ANA, and numerous other chronic conditions. Takes celebrex and flexeril regularly.        Past Medical History:  Diagnosis Date  . Allergic rhinitis, cause unspecified   . Anemia   . Anxiety   . Depression   . Diabetes mellitus type II    steroid related, patient states "im no longer diabetic"  . Essential hypertension 08/08/2015  . GERD (gastroesophageal reflux disease)   . History of blood transfusion   . Hyperlipidemia   . Menorrhagia   . Migraine   . Morbid obesity ([place])   . Otitis media 06/28/2015  . Peptic ulcer disease   . Positive ANA (antinuclear antibody) 02/14/2012  . Sarcoid    including hand per rheumatology-Dr. Amil Amen  . Sarcoidosis of lung ([place])   . Shortness of breath    on exertion  . Varicose veins with pain     Patient Active Problem List   Diagnosis Date Noted  . Vitamin D deficiency 10/21/2020  . Right low back pain 10/15/2020  . Recurrent falls while walking 10/15/2020  . COVID-19 virus infection 09/28/2020  . Exposure to COVID-19 virus 07/10/2020  . Viral illness 07/10/2020  . Sinusitis 05/23/2020  . Allergic conjunctivitis 05/23/2020  . Yeast vaginitis 05/23/2020  . Polyarthralgia 02/20/2020  . Tibialis posterior tendon tear, nontraumatic, right 02/08/2020  . Neck pain 12/14/2019  . COPD (chronic obstructive pulmonary disease) ([place]) 11/07/2019   . Suspected COVID-19 virus infection 08/02/2019  . Hematochezia 04/05/2019  . Snoring 11/17/2018  . Eye symptom 08/03/2018  . Right lumbar radiculopathy 05/08/2018  . Chronic pain 10/11/2017  . Sarcoidosis of lung ([place])   . Acute on chronic diastolic CHF (congestive heart failure) ([place]) 09/22/2017  . Rheumatoid arthritis ([place]) 09/18/2017  . Chronic diastolic CHF (congestive heart failure) ([place]) 09/18/2017  . Pancreatitis 06/26/2017  . Right ankle sprain 06/26/2017  . Possible exposure to STD 06/15/2017  . Diarrhea 04/10/2017  . Bilateral hand swelling 04/07/2017  . Abdominal pain 04/07/2017  . Abnormality of gait 01/26/2017  . Avulsion fracture of right ankle 12/01/2016  . Ulcer of esophagus with bleeding   . Gastritis and gastroduodenitis   . Blepharitis of right upper eyelid 10/09/2016  . Peroneal tendinitis of lower leg, right 10/06/2016  . Right ankle pain 09/16/2016  . Dizziness 03/11/2016  . Mixed headache 03/09/2016  . Chronic venous insufficiency 02/24/2016  . Gastroenteritis 12/30/2015  . Headache 12/30/2015  . Epistaxis 12/09/2015  . 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 Cervical muscle strain 12/03/2015  . Right-sided face pain 11/26/2015  . Multiple contusions 11/26/2015  . Neck pain, bilateral posterior 11/26/2015  . Low back pain 11/26/2015  . Spinal stenosis of lumbar region 10/28/2015  . Chronic sciatica of left side 10/28/2015  . DOE (dyspnea on exertion) 09/02/2015  . Essential hypertension 08/08/2015  . Diaphoresis 08/07/2015  .  Cough 07/02/2015  . Vaginitis and vulvovaginitis 06/29/2015  . Otitis media 06/28/2015  . Thrush 06/24/2015  . Nausea & vomiting 06/10/2015  . Wheezing 11/14/2014  . Angioedema 03/19/2014  . Vaginitis 11/19/2012  . Smoker 11/19/2012  . Positive ANA (antinuclear antibody) 02/14/2012  . Polyarthritis 02/11/2012  . Migraine 04/05/2011  . Encounter for well adult exam with abnormal findings 02/21/2011  . Allergic rhinitis  02/17/2011  . URTICARIA 09/29/2010  . Depression 09/14/2010  . Diabetes mellitus with neuropathy ([place]) 07/31/2010  . PERIPHERAL EDEMA 07/31/2010  . INSOMNIA-SLEEP DISORDER-UNSPEC 06/19/2010  . MENORRHAGIA 05/15/2010  . Hyperlipidemia 12/17/2009  . Iron deficiency anemia 12/17/2009  . Anxiety state 12/17/2009  . Morbid obesity ([place]) 07/02/2007  . Gastroesophageal reflux disease 07/02/2007  . PEPTIC ULCER DISEASE 07/02/2007    Past Surgical History:  Procedure Laterality Date  . ABDOMINAL HYSTERECTOMY    . COLONOSCOPY WITH PROPOFOL N/A 10/19/2016   Procedure: COLONOSCOPY WITH PROPOFOL;  Surgeon: Mauri Pole, MD;  Location: [facility];  Service: Endoscopy;  Laterality: N/A;  . ESOPHAGOGASTRODUODENOSCOPY (EGD) WITH PROPOFOL N/A 10/19/2016   Procedure: ESOPHAGOGASTRODUODENOSCOPY (EGD) WITH PROPOFOL;  Surgeon: Mauri Pole, MD;  Location: [facility];  Service: Endoscopy;  Laterality: N/A;  . HERNIA MESH REMOVAL  02/2013  . uterine ablation  03/2010  . WISDOM TOOTH EXTRACTION      OB History   No obstetric history on file.      Home Medications    Prior to Admission medications   Medication Sig Start Date End Date Taking? Authorizing Provider  acetaminophen (TYLENOL) 650 MG CR tablet Take 650 mg by mouth every 8 (eight) hours as needed for pain.   Yes [provider]  albuterol (VENTOLIN HFA) 108 (90 Base) MCG/ACT inhaler INHALE 2 PUFFS INTO THE LUNGS EVERY 4 HOURS AS NEEDED FOR WHEEZING OR SHORTNESS OF BREATH 03/31/20  Yes Byrum, Rose Fillers, MD  colchicine 0.6 MG tablet Take 1 tablet (0.6 mg total) by mouth daily. 02/20/20  Yes Biagio Borg, MD  furosemide (LASIX) 40 MG tablet 1 tab by mouth every day in the AM and then 1 as needed only for persistent swelling in the PM 06/16/16  Yes Biagio Borg, MD  insulin aspart (NOVOLOG) 100 UNIT/ML FlexPen 12 units four times per day E11.9 07/03/20  Yes Biagio Borg, MD  levalbuterol [facility] HFA) 45 MCG/ACT inhaler  Inhale 1-2 puffs into the lungs every 8 (eight) hours as needed for wheezing or shortness of breath. USE 2 PUFFS 3 TIMES DAILY X 5 DAYS THEN BACK TO HOME REGIMEN AS NEEDED. 09/22/17  Yes Eugenie Filler, MD  mometasone-formoterol [facility]) 200-5 MCG/ACT AERO Inhale 2 puffs into the lungs 2 (two) times a day. 05/21/19  Yes Collene Gobble, MD  oxyCODONE-acetaminophen (PERCOCET) 10-325 MG tablet Take 1 tablet by mouth every 8 (eight) hours as needed for pain.  08/18/18  Yes [provider]  rosuvastatin (CRESTOR) 40 MG tablet TAKE 1 TABLET(40 MG) BY MOUTH DAILY 12/31/19  Yes Biagio Borg, MD  sertraline (ZOLOFT) 100 MG tablet TAKE 2 TABLETS(200 MG) BY MOUTH DAILY 02/19/20  Yes Biagio Borg, MD  SYMBICORT 160-4.5 MCG/ACT inhaler Inhale 2 puffs into the lungs 2 (two) times daily. 12/08/19  Yes [provider]  telmisartan (MICARDIS) 40 MG tablet Take 40 mg by mouth daily.   Yes [provider]  ALPRAZolam (XANAX) 0.5 MG tablet TAKE 1 TABLET(0.5 MG) BY MOUTH THREE TIMES DAILY AS NEEDED 06/11/20  Biagio Borg, MD  azelastine (ASTELIN) 0.1 % nasal spray Place 2 sprays into both nostrils 2 (two) times daily. Use in each nostril as directed 03/11/16   Biagio Borg, MD  Blood Glucose Monitoring Suppl [facility] VERIO) w/Device KIT Use as directed daily E11.9 08/08/19   Biagio Borg, MD  celecoxib (CELEBREX) 200 MG capsule Take 1 capsule (200 mg total) by mouth 2 (two) times daily as needed. 02/20/20   Biagio Borg, MD  cetirizine (ZYRTEC) 10 MG tablet TAKE 1 TABLET BY MOUTH DAILY 10/22/20   Biagio Borg, MD  cyclobenzaprine (FLEXERIL) 5 MG tablet TAKE 1 TABLET(5 MG) BY MOUTH THREE TIMES DAILY AS NEEDED FOR MUSCLE SPASMS 10/15/20   Biagio Borg, MD  Diclofenac Sodium (PENNSAID) 2 % SOLN Apply 1 pump twice daily as needed. Patient taking differently: Apply 1 Pump topically 2 (two) times daily. Apply 1 pump twice daily 08/31/18   Biagio Borg, MD  diphenoxylate-atropine (LOMOTIL)  2.5-0.025 MG tablet Take 1 tablet by mouth 4 (four) times daily as needed for diarrhea or loose stools. 09/22/20   Biagio Borg, MD  doxycycline (VIBRA-TABS) 100 MG tablet Take 1 tablet (100 mg total) by mouth 2 (two) times daily. 09/12/20   Biagio Borg, MD  fluconazole (DIFLUCAN) 150 MG tablet 1 tab by mouth every 3 days as needed 07/10/20   Biagio Borg, MD  fluticasone [facility]) 50 MCG/ACT nasal spray Place 2 sprays into both nostrils daily. 09/23/17   Eugenie Filler, MD  gabapentin (NEURONTIN) 300 MG capsule TAKE 2 CAPSULES(600 MG) BY MOUTH THREE TIMES DAILY 10/21/20   Biagio Borg, MD  glucose blood [facility] VERIO) test strip USE AS DIRECTED THREE TIMES DAILY 08/24/20   Biagio Borg, MD  ibuprofen (ADVIL) 800 MG tablet Take 800 mg by mouth every 6 (six) hours as needed. 07/06/20   [provider]  Insulin Pen Needle (BD PEN NEEDLE NANO U/F) 32G X 4 MM MISC Use as directed three times daily E11.9 08/08/19   Biagio Borg, MD  nystatin (MYCOSTATIN) 100000 UNIT/ML suspension  06/25/20   [provider]  omeprazole (PRILOSEC) 40 MG capsule TAKE 1 CAPSULE(40 MG) BY MOUTH DAILY 01/24/20   Biagio Borg, MD  ondansetron (ZOFRAN ODT) 4 MG disintegrating tablet Take 1 tablet (4 mg total) by mouth every 8 (eight) hours as needed for nausea or vomiting. 09/22/20   Biagio Borg, MD  predniSONE (DELTASONE) 10 MG tablet 3 tabs by mouth per day for 3 days,2tabs per day for 3 days,1tab per day for 3 days 10/01/20   Biagio Borg, MD  rizatriptan (MAXALT-MLT) 10 MG disintegrating tablet Take 1 tablet earliest onset of migraine.  May repeat in 2 hours if needed.  Maximum 2 tablets in 24 hours Patient taking differently: Take 10 mg by mouth every 2 (two) hours as needed for migraine. Take 1 tablet earliest onset of migraine.  May repeat in 2 hours if needed.  Maximum 2 tablets in 24 hours 11/08/19   Metta Clines R, DO  TURMERIC PO Take 1 tablet by mouth daily.    [provider]   vitamin B-12 (CYANOCOBALAMIN) 1000 MCG tablet Take 1,000 mcg by mouth daily.    [provider]  Vitamin D, Ergocalciferol, (DRISDOL) 1.25 MG (50000 UNIT) CAPS capsule TAKE 1 CAPSULE BY MOUTH EVERY 7 DAYS 10/22/20   Lyndal Pulley, DO  zonisamide (ZONEGRAN) 100 MG capsule TAKE 1 CAPSULE(100 MG) BY  MOUTH DAILY 09/17/20   Pieter Partridge, DO    Family History Family History  Problem Relation Age of Onset  . Allergies Mother   . Heart attack Mother   . Diabetes Mother   . Diabetes Father   . Heart disease Father   . Rheum arthritis Father   . Stroke Father   . Hypertension Other   . Ovarian cancer Maternal Aunt   . Lung cancer Maternal Aunt   . Breast cancer Maternal Aunt   . Bone cancer Maternal Aunt   . Stroke Maternal Uncle   . Other Neg Hx     Social History Social History   Tobacco Use  . Smoking status: Former Smoker    Packs/day: 1.00    Years: 20.00    Pack years: 20.00    Types: Cigarettes    Quit date: 10/25/2012    Years since quitting: 8.0  . Smokeless tobacco: Never Used  . Tobacco comment: QUIT 04/2010 AND STARTED BACK 2014 X 3 MONTHS. less than 1 ppd.  started at age 46.    Vaping Use  . Vaping Use: Never used  Substance Use Topics  . Alcohol use: No  . Drug use: No     Allergies   Azithromycin, Bee venom, Flagyl [metronidazole], Penicillins, Shellfish allergy, and Klonopin [clonazepam]   Review of Systems Review of Systems PER HPI   Physical Exam Triage Vital Signs ED Triage Vitals  Enc Vitals Group     BP 10/30/20 1255 137/72     Pulse Rate 10/30/20 1255 91     Resp 10/30/20 1255 (!) 22     Temp 10/30/20 1255 98 F (36.7 C)     Temp Source 10/30/20 1255 Oral     SpO2 10/30/20 1255 100 %     Weight --      Height --      Head Circumference --      Peak Flow --      Pain Score 10/30/20 1250 8     Pain Loc --      Pain Edu? --      Excl. in [place]? --    No data found.  Updated Vital Signs BP 137/72 (BP Location: Left Arm)  Comment (BP Location): regular cuff on forearm  Pulse 91   Temp 98 F (36.7 C) (Oral)   Resp (!) 22   LMP 07/17/2012   SpO2 100%   Visual Acuity Right Eye Distance:   Left Eye Distance:   Bilateral Distance:    Right Eye Near:   Left Eye Near:    Bilateral Near:     Physical Exam Vitals and nursing note reviewed.  Constitutional:      Appearance: Normal appearance. She is not ill-appearing.  HENT:     Head: Atraumatic.  Eyes:     Extraocular Movements: Extraocular movements intact.     Conjunctiva/sclera: Conjunctivae normal.  Cardiovascular:     Rate and Rhythm: Normal rate and regular rhythm.     Heart sounds: Normal heart sounds.  Pulmonary:     Effort: Pulmonary effort is normal.     Breath sounds: Normal breath sounds.  Musculoskeletal:        General: Tenderness present. No swelling or deformity. Normal range of motion.     Cervical back: Normal range of motion and neck supple.     Comments: Good strength and ROM b/l UEs  Skin:    General: Skin is warm and dry.  Findings: Bruising (localized area of bruising medial right elbow, mildly ttp but no masses palpable in area) present. No lesion.  Neurological:     Mental Status: She is alert and oriented to person, place, and time.     Comments: neurovascularly intact b/l UEs  Psychiatric:        Mood and Affect: Mood normal.        Thought Content: Thought content normal.        Judgment: Judgment normal.      UC Treatments / Results  Labs (all labs ordered are listed, but only abnormal results are displayed) Labs Reviewed - No data to display  EKG   Radiology DG Elbow Complete Right  Result Date: 10/30/2020 CLINICAL DATA:  Elbow pain.  No injury. EXAM: RIGHT ELBOW - COMPLETE 3+ VIEW COMPARISON:  No recent. FINDINGS: No evidence of effusion. Mild degenerative change. No acute bony or joint abnormality. No evidence of fracture or dislocation. IMPRESSION: Mild degenerative change. No acute abnormality.  Electronically Signed   By: Marcello Moores  Register   On: 10/30/2020 13:47    Procedures Procedures (including critical care time)  Medications Ordered in UC Medications  triamcinolone acetonide (KENALOG-40) injection 40 mg (has no administration in time range)    Initial Impression / Assessment and Plan / UC Course  I have reviewed the triage vital signs and the nursing notes.  Pertinent labs & imaging results that were available during my care of the patient were reviewed by me and considered in my medical decision making (see chart for details).     Possible soft tissue strain, exam overall benign and x-ray showing no effusion or bony abnormality. IM kenalog given in clinic, continue typical pain regimen, ice, rest. Sports Med information given if not resolving.   Final Clinical Impressions(s) / UC Diagnoses   Final diagnoses:  Right elbow pain   Discharge Instructions   None    ED Prescriptions    None     PDMP not reviewed this encounter.   Volney American, [place] 10/30/20 1414

## 2020-10-30 NOTE — ED Triage Notes (Signed)
Woke 3 days ago with elbow stiffness.  Tried to use a TENS unit on right elbow, but this was too uncomfortable.  Patient complains of a knot.  Not certain if knot palpable to this nurse.  Patient is right handed.  Patient is in pain management for lower back pain.

## 2020-10-31 NOTE — Progress Notes (Signed)
I, Nicole Melendez, LAT, ATC, am serving as scribe for Nicole Melendez.  Nicole Melendez is a 45 y.o. female who presents to [place] [facility] today for R elbow pain.  She was last seen by Nicole Melendez on 02/26/20 for LBP.  Since then, she reports R elbow pain x approximately one week. MOI: Pt fell about a week ago and landed on R elbow and R side of her back. Pt reports falling due to losing her balance, which is a reoccurring issue. She was seen at the [facility] on 10/30/2020 and had a R elbow XR and an IM injection, w/ no relief.  Since then, pt reports bruise on R elbow and back. Pt c/o fingers 2-4 trembling after typing.  Radiating pain: down forearm and into hand R elbow swelling: yes R UE numbness/tingling: yes- fingers 2-4 and cold R UE weakness: yes Aggravating factors: moving elbow Treatments tried: Celebrex; Flexeril; Epson salt, moving  Diagnostic testing: R elbow XR- 10/30/20   Pertinent review of systems: No fevers or chills  Relevant historical information: Sarcoidosis, COPD, heart failure, chronic pain   Exam:  BP 130/88 (BP Location: Left Arm, Patient Position: Sitting, Cuff Size: Normal)    Pulse 87    Ht 5\' 7"  (1.702 m)    Wt (!) 337 lb (152.9 kg)    LMP 07/17/2012    SpO2 98%    BMI 52.78 kg/m  General: Well Developed, well nourished, and in no acute distress.   MSK: Right elbow normal-appearing Tender palpation at olecranon and at lateral epicondyle.  Decreased elbow motion.  Lacks full extension by 5 degrees and lacks supination by approximately 20 degrees  Right wrist normal-appearing Tender palpation at dorsal wrist at radial aspect and at volar wrist at carpal tunnel. Decreased wrist motion due to pain. Mildly positive Tinel's at carpal tunnel  Right hand normal-appearing nontender normal strength. Sensation is intact.  Pulses are intact.     Lab and Radiology Results No results found for this or any previous visit (from  the past 72 hour(s)). DG Elbow Complete Right  Result Date: 10/30/2020 CLINICAL DATA:  Elbow pain.  No injury. EXAM: RIGHT ELBOW - COMPLETE 3+ VIEW COMPARISON:  No recent. FINDINGS: No evidence of effusion. Mild degenerative change. No acute bony or joint abnormality. No evidence of fracture or dislocation. IMPRESSION: Mild degenerative change. No acute abnormality. Electronically Signed   By: Nicole Melendez  Register   On: 10/30/2020 13:47  I, Nicole Melendez, personally (independently) visualized and performed the interpretation of the images attached in this note.  X-ray images right elbow and right wrist obtained today personally and independently interpreted  Right elbow: Mild DJD no fractures visible.  No joint effusion.  Right wrist: Mild DJD.  No fractures visible.  Await formal radiology review  Diagnostic [facility] MSK Ultrasound of: Right elbow and wrist Right elbow: No visible fractures cortex.  Normal-appearing lateral epicondyle.  Olecranon normal-appearing Right wrist mild joint effusion present dorsal aspect of wrist.  No visible cortex fracture.  Carpal tunnel slightly enlarged median nerve otherwise normal-appearing Impression: Right wrist effusion possible carpal tunnel syndrome  Patient was fitted today with a custom-made fiberglass  long-arm splint   Assessment and Plan: 45 y.o. female with right elbow and wrist pain after fall.  No evidence of fracture on original elbow x-ray on January 6 boron follow-up x-ray of the elbow today or in wrist x-ray today.  Patient does have a significant amount  of pain as well as some paresthesias into the hand and the median nerve dermatomal pattern.  Think mostly she suffered a contusion and strain and to have a lot of swelling and inflammation because of the paresthesias.  I am hopeful that by mobilizing her arm and giving it about a weeks of rest she is good to start feeling better.  Plan for immobilization with a long-arm splint and recheck in 1  week.  We will go ahead and refer to physical therapy now in the hopes that it could start in a couple of weeks.  I think she will benefit from that.  There is some urgency here.  She is unable to work.  She works doing typing and admin tasks and effectively cannot use her right arm for much of anything right now.  Work note out of work.  Happy to fill out FMLA or short-term disability paperwork.   PDMP not reviewed this encounter. Orders Placed This Encounter  Procedures   [place] LIMITED JOINT SPACE STRUCTURES UP RIGHT(NO LINKED CHARGES)    Order Specific Question:   Reason for Exam (SYMPTOM  OR DIAGNOSIS REQUIRED)    Answer:   eval elbow and wrist    Order Specific Question:   Preferred imaging location?    Answer:   [facility]   DG Wrist Complete Right    Standing Status:   Future    Number of Occurrences:   1    Standing Expiration Date:   11/03/2021    Order Specific Question:   Reason for Exam (SYMPTOM  OR DIAGNOSIS REQUIRED)    Answer:   eval wrist pain r    Order Specific Question:   Is patient pregnant?    Answer:   No    Order Specific Question:   Preferred imaging location?    Answer:   [facility]   DG ELBOW COMPLETE RIGHT (3+VIEW)    Standing Status:   Future    Number of Occurrences:   1    Standing Expiration Date:   11/03/2021    Order Specific Question:   Reason for Exam (SYMPTOM  OR DIAGNOSIS REQUIRED)    Answer:   eval wrist pain    Order Specific Question:   Is patient pregnant?    Answer:   No    Order Specific Question:   Preferred imaging location?    Answer:   [facility]   No orders of the defined types were placed in this encounter.    Discussed warning signs or symptoms. Please see discharge instructions. Patient expresses understanding.   The above documentation has been reviewed and is accurate and complete Nicole Melendez, M.D.

## 2020-11-03 ENCOUNTER — Other Ambulatory Visit: Payer: Self-pay

## 2020-11-03 ENCOUNTER — Ambulatory Visit (INDEPENDENT_AMBULATORY_CARE_PROVIDER_SITE_OTHER): Payer: BC Managed Care – PPO

## 2020-11-03 ENCOUNTER — Ambulatory Visit: Payer: Self-pay

## 2020-11-03 ENCOUNTER — Ambulatory Visit: Payer: BC Managed Care – PPO | Admitting: Family Medicine

## 2020-11-03 VITALS — BP 130/88 | HR 87 | Ht 67.0 in | Wt 337.0 lb

## 2020-11-03 DIAGNOSIS — M25521 Pain in right elbow: Secondary | ICD-10-CM

## 2020-11-03 DIAGNOSIS — M19031 Primary osteoarthritis, right wrist: Secondary | ICD-10-CM | POA: Diagnosis not present

## 2020-11-03 DIAGNOSIS — M25531 Pain in right wrist: Secondary | ICD-10-CM

## 2020-11-03 NOTE — Progress Notes (Signed)
X-ray right ankle no acute findings to radiology.

## 2020-11-03 NOTE — Progress Notes (Signed)
X-ray right wrist looks normal to radiology

## 2020-11-03 NOTE — Patient Instructions (Signed)
Thank you for coming in today.  Recheck in about 1 week.   Ok to remove the splint if needed for a bit unless we find a fracture on xray.    Cast or Splint Care, Adult Casts and splints are supports that are worn to protect broken bones and other injuries. A cast or splint may hold a bone still and in the correct position while it heals. Casts and splints may also help to ease pain, swelling, and muscle spasms. A cast is a hardened support that is usually made of fiberglass or plaster. It is custom-fit to the body and offers more protection than a splint. Most casts cannot be taken off and put back on. A splint is a type of soft support that is usually made from cloth and elastic. It can be adjusted or taken off as needed. Often, splints are used on broken bones at first. Later, a cast can replace the splint after the swelling goes down. What are the risks? In some cases, wearing a cast or splint can cause a reduced blood supply to the wrist or hand or to the foot and toes. This can happen if there is a lot of swelling or if the cast or splint is too tight. Limited blood supply can cause a problem called compartment syndrome. This can lead to lasting damage. Symptoms include:  Pain that is getting worse.  Numbness and tingling.  Changes in skin color, including paleness or a bluish color.  Cold fingers or toes. Other problems from wearing a cast or splint can include:  Skin irritation that can cause: ? Itching. ? Rash. ? Skin sores. ? Skin infection.  Limb stiffness or weakness. How to care for your cast  Check the skin around it every day. Tell your doctor about any concerns.  Do not stick anything inside it to scratch your skin.  You may put lotion on dry skin around the edges of the cast. Do not put lotion on the skin underneath it.  Keep it clean and dry.   How to care for your splint  Wear the splint as told by your doctor. Take it off only as told by your  doctor.  Check the skin around it every day. Tell your doctor about any concerns.  Loosen it if your fingers or toes tingle, get numb, or turn cold and blue.  Keep it clean and dry. Clean your splint as told by your doctor. Use mild soap and water and let it air-dry. Do not use heat on the splint. Follow these instructions at home: Bathing  Do not take baths, swim, or use a hot tub until your doctor approves. Ask your doctor if you may take showers. You may only be allowed to take sponge baths.  If the cast or splint is not waterproof: ? Do not let it get wet. ? Cover it with a watertight covering when you take a bath or a shower. Managing pain, stiffness, and swelling  If told, put ice on the affected area. To do this: ? If you have a cast or splint that can be taken off, take it off as told by your doctor. ? Put ice in a plastic bag. ? Place a towel between your skin and the bag or between your cast and the bag. ? Leave the ice on for 20 minutes, 2-3 times a day.  Move your fingers or toes often.  Raise (elevate) the injured area above the level of your heart while  you are sitting or lying down.   Safety  Do not use your injured leg or foot to support your body weight until your doctor says that you can.  Use crutches or other helpful (assistive) devices as told by your doctor.  Ask your doctor when it is safe to drive if you have a cast or splint on part of your body. General instructions  Do not put pressure on any part of the cast or splint until it is fully hardened. This may take many hours.  Take over-the-counter and prescription medicines only as told by your doctor.  Do not use any products that contain nicotine or tobacco, such as cigarettes, e-cigarettes, and chewing tobacco. These can delay healing. If you need help quitting, ask your doctor.  Return to your normal activities as told by your doctor. Ask your doctor what activities are safe for you.  Keep all  follow-up visits as told by your doctor. This is important. Contact a doctor if:  The skin around the cast or splint gets red or raw.  The skin under the cast is very itchy or painful.  Your cast or splint: ? Gets damaged. ? Feels very uncomfortable. ? Is too tight or too loose.  Your cast becomes wet or it starts to have a soft spot or area.  There is a bad smell coming from under your cast.  You get an object stuck under your cast. Get help right away if:  You get any symptoms of compartment syndrome, such as: ? Very bad pain or pressure under the cast. ? Numbness, tingling, coldness, or pale or bluish skin.  The part of your body above or below the cast is swollen, and it turns a different color (is discolored).  You cannot feel or move your fingers or toes.  Your pain gets worse.  There is fluid leaking through the cast.  You have trouble breathing or shortness of breath.  You have chest pain. Summary  Casts and splints are worn to protect broken bones and other injuries.  Most casts cannot be taken off, and most splints can be taken off.  Keep your cast or splint clean and dry.  Take off your cast or splint only as told by your doctor.  Get help right away if you have very bad pain, numbness, tingling, or skin that turns cold or another color. This information is not intended to replace advice given to you by your health care provider. Make sure you discuss any questions you have with your health care provider. Document Revised: 06/28/2019 Document Reviewed: 06/28/2019 Elsevier Patient Education  [place].

## 2020-11-04 ENCOUNTER — Other Ambulatory Visit: Payer: Self-pay | Admitting: Internal Medicine

## 2020-11-07 DIAGNOSIS — M25541 Pain in joints of right hand: Secondary | ICD-10-CM | POA: Diagnosis not present

## 2020-11-07 DIAGNOSIS — M25531 Pain in right wrist: Secondary | ICD-10-CM | POA: Diagnosis not present

## 2020-11-07 DIAGNOSIS — M25631 Stiffness of right wrist, not elsewhere classified: Secondary | ICD-10-CM | POA: Diagnosis not present

## 2020-11-07 DIAGNOSIS — M25521 Pain in right elbow: Secondary | ICD-10-CM | POA: Diagnosis not present

## 2020-11-10 ENCOUNTER — Ambulatory Visit: Payer: BC Managed Care – PPO | Admitting: Family Medicine

## 2020-11-11 ENCOUNTER — Encounter: Payer: Self-pay | Admitting: Family Medicine

## 2020-11-12 ENCOUNTER — Other Ambulatory Visit: Payer: Self-pay

## 2020-11-12 NOTE — Progress Notes (Signed)
   I, [name], LAT, ATC, am serving as scribe for Dr. Lynne Leader.  Nicole Melendez is a 45 y.o. female who presents to [place] at [facility] today for f/u of R elbow pain after suffering a fall in early Jan 2022. She was last seen by Dr. Georgina Snell on 11/03/20 and was provided w/ a long arm splint.  Since her last visit, pt reports PT has been painful, but she pushed through and has seen improvement. Pt notes she is able to grip things now. Pt c/o of trembling in fingers and numbness/tingling.  Overall she thinks she is improving with hand physical therapy.  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  She is right-hand dominant.  Diagnostic testing: R elbow and R wrist XR- 11/03/20; R elbow XR- 10/30/20  Pertinent review of systems: No feve lordosis, COPD rs or chills  Relevant historical information: Heart failure,   Exam:  BP 118/78 (BP Location: Left Arm, Patient Position: Sitting, Cuff Size: Large)   Pulse (!) 103   Ht 5\' 7"  (1.702 m)   Wt (!) 336 lb (152.4 kg)   LMP 07/17/2012   SpO2 98%   BMI 52.63 kg/m  General: Well Developed, well nourished, and in no acute distress.   MSK: Right elbow normal-appearing nontender normal elbow motion. Right forearm normal-appearing tender palpation volar forearm. Normal wrist motion.  Some pain with wrist extension and resisted wrist flexion as well as resisted finger flexion.  Grip strength is intact. Pulses cap refill and sensation intact distally    Lab and Radiology Results EXAM: RIGHT ELBOW - COMPLETE 3+ VIEW  COMPARISON:  None.  FINDINGS: There is no evidence of fracture, dislocation, or joint effusion. There is no evidence of arthropathy or other focal bone abnormality. Soft tissues are unremarkable.  IMPRESSION: No acute osseous injury of the right elbow.   Electronically Signed   By: Kathreen Devoid   On: 11/03/2020 08:46   EXAM: RIGHT WRIST -  COMPLETE 3+ VIEW  COMPARISON:  None.  FINDINGS: No acute fracture or dislocation. No aggressive osseous lesion. Normal alignment. Intraosseous ganglion cyst in the distal pole of the scaphoid. 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 Mild osteoarthritis of the first [place] joint.  Soft tissue are unremarkable. No radiopaque foreign body or soft tissue emphysema.  IMPRESSION: No acute osseous injury of the right wrist.   Electronically Signed   By: Kathreen Devoid   On: 11/03/2020 10:04   I, Lynne Leader, personally (independently) visualized and performed the interpretation of the images attached in this note.    Assessment and Plan: 45 y.o. female with right volar forearm and wrist pain.  Patient fell and effectively suffered a contusion of her volar forearm musculature.  She is improving with the hand physical therapy.  Plan for continued conservative management.  Anticipate return to work on January 31.  She will let me know next week if she cannot return to work I will extend work note.  Recheck back as needed.    Discussed warning signs or symptoms. Please see discharge instructions. Patient expresses understanding.   The above documentation has been reviewed and is accurate and complete Lynne Leader, M.D. Total encounter time 20 minutes including face-to-face time with the patient and, reviewing past medical record, and charting on the date of service.   Treatment plan and options.

## 2020-11-13 ENCOUNTER — Ambulatory Visit: Payer: BC Managed Care – PPO | Admitting: Family Medicine

## 2020-11-13 ENCOUNTER — Telehealth (INDEPENDENT_AMBULATORY_CARE_PROVIDER_SITE_OTHER): Payer: BC Managed Care – PPO | Admitting: Internal Medicine

## 2020-11-13 VITALS — BP 118/78 | HR 103 | Ht 67.0 in | Wt 336.0 lb

## 2020-11-13 DIAGNOSIS — E1140 Type 2 diabetes mellitus with diabetic neuropathy, unspecified: Secondary | ICD-10-CM | POA: Diagnosis not present

## 2020-11-13 DIAGNOSIS — Z794 Long term (current) use of insulin: Secondary | ICD-10-CM

## 2020-11-13 DIAGNOSIS — J329 Chronic sinusitis, unspecified: Secondary | ICD-10-CM

## 2020-11-13 DIAGNOSIS — M25531 Pain in right wrist: Secondary | ICD-10-CM

## 2020-11-13 DIAGNOSIS — J301 Allergic rhinitis due to pollen: Secondary | ICD-10-CM

## 2020-11-13 MED ORDER — DOXYCYCLINE HYCLATE 100 MG PO TABS: 100.0000 mg | ORAL_TABLET | Freq: Two times a day (BID) | ORAL | 0 refills | Status: DC

## 2020-11-13 NOTE — Progress Notes (Signed)
Patient ID: Nicole Melendez, female   DOB: [birth year], 45 y.o.   MRN: [id number]  Virtual Visit via Video Note  I connected with Nicole Melendez on 11/13/20 at  2:40 PM EST by a video enabled telemedicine application and verified that I am speaking with the correct person using two identifiers.  Location of all participants today Patient: at home Provider: at office   I discussed the limitations of evaluation and management by telemedicine and the availability of in person appointments. The patient expressed understanding and agreed to proceed.  History of Present Illness:  Here with 2-3 days acute onset fever, facial pain, pressure, headache, general weakness and malaise, and greenish d/c, with mild ST and cough, but pt denies chest pain, increased sob or doe, orthopnea, PND, increased LE swelling, palpitations, dizziness or syncope, but has mild wheezing better with albuterol hfa use prn..  Allergy med not helping.  Friend with recent URI.  Pt is s/p vaccination and booster.  Had covid infection nov 2021 with monoclonal ab tx. No Nausea, diarrhea  Past Medical History:  Diagnosis Date  . Allergic rhinitis, cause unspecified   . Anemia   . Anxiety   . Depression   . Diabetes mellitus type II    steroid related, patient states "im no longer diabetic"  . Essential hypertension 08/08/2015  . GERD (gastroesophageal reflux disease)   . History of blood transfusion   . Hyperlipidemia   . Menorrhagia   . Migraine   . Morbid obesity ([place])   . Otitis media 06/28/2015  . Peptic ulcer disease   . Positive ANA (antinuclear antibody) 02/14/2012  . Sarcoid    including hand per rheumatology-Dr. Amil Amen  . Sarcoidosis of lung ([place])   . Shortness of breath    on exertion  . Varicose veins with pain    Past Surgical History:  Procedure Laterality Date  . ABDOMINAL HYSTERECTOMY    . COLONOSCOPY WITH PROPOFOL N/A 10/19/2016   Procedure: COLONOSCOPY WITH PROPOFOL;  Surgeon: Mauri Pole, MD;   Location: [facility];  Service: Endoscopy;  Laterality: N/A;  . ESOPHAGOGASTRODUODENOSCOPY (EGD) WITH PROPOFOL N/A 10/19/2016   Procedure: ESOPHAGOGASTRODUODENOSCOPY (EGD) WITH PROPOFOL;  Surgeon: Mauri Pole, MD;  Location: [facility];  Service: Endoscopy;  Laterality: N/A;  . HERNIA MESH REMOVAL  02/2013  . uterine ablation  03/2010  . WISDOM TOOTH EXTRACTION      reports that she quit smoking about 8 years ago. Her smoking use included cigarettes. She has a 20.00 pack-year smoking history. She has never used smokeless tobacco. She reports that she does not drink alcohol and does not use drugs. family history includes Allergies in her mother; Bone cancer in her maternal aunt; Breast cancer in her maternal aunt; Diabetes in her father and mother; Heart attack in her mother; Heart disease in her father; Hypertension in an other family member; Lung cancer in her maternal aunt; Ovarian cancer in her maternal aunt; Rheum arthritis in her father; Stroke in her father and maternal uncle. Allergies  Allergen Reactions  . Azithromycin Hives    (z pak) hives  . Bee Venom Anaphylaxis  . Flagyl [Metronidazole] Hives and Shortness Of Breath  . Penicillins Shortness Of Breath and Swelling    Has patient had a PCN reaction causing immediate rash, facial/tongue/throat swelling, SOB or lightheadedness with hypotension: Yes Has patient had a PCN reaction causing severe rash involving mucus membranes or skin necrosis: No Has patient had a PCN reaction that required hospitalization No  Has patient had a PCN reaction occurring within the last 10 years: No If all of the above answers are "NO", then may proceed with Cephalosporin use.  REACTION: swelling and difficulty breathing  . Shellfish Allergy Anaphylaxis  . Klonopin [Clonazepam] Other (See Comments)    Memory difficulty   Current Outpatient Medications on File Prior to Visit  Medication Sig Dispense Refill  . acetaminophen (TYLENOL) 650 MG  CR tablet Take 650 mg by mouth every 8 (eight) hours as needed for pain.    Marland Kitchen albuterol (VENTOLIN HFA) 108 (90 Base) MCG/ACT inhaler INHALE 2 PUFFS INTO THE LUNGS EVERY 4 HOURS AS NEEDED FOR WHEEZING OR SHORTNESS OF BREATH 8.5 g 0  . ALPRAZolam (XANAX) 0.5 MG tablet TAKE 1 TABLET(0.5 MG) BY MOUTH THREE TIMES DAILY AS NEEDED 90 tablet 2  . azelastine (ASTELIN) 0.1 % nasal spray Place 2 sprays into both nostrils 2 (two) times daily. Use in each nostril as directed 30 mL 12  . Blood Glucose Monitoring Suppl (ONETOUCH VERIO) w/Device KIT Use as directed daily E11.9 1 kit 0  . celecoxib (CELEBREX) 200 MG capsule Take 1 capsule (200 mg total) by mouth 2 (two) times daily as needed. 60 capsule 2  . cetirizine (ZYRTEC) 10 MG tablet TAKE 1 TABLET BY MOUTH DAILY 90 tablet 1  . colchicine 0.6 MG tablet Take 1 tablet (0.6 mg total) by mouth daily. 60 tablet 5  . cyclobenzaprine (FLEXERIL) 5 MG tablet TAKE 1 TABLET(5 MG) BY MOUTH THREE TIMES DAILY AS NEEDED FOR MUSCLE SPASMS 40 tablet 2  . Diclofenac Sodium (PENNSAID) 2 % SOLN Apply 1 pump twice daily as needed. (Patient taking differently: Apply 1 Pump topically 2 (two) times daily. Apply 1 pump twice daily) 112 g 3  . diphenoxylate-atropine (LOMOTIL) 2.5-0.025 MG tablet Take 1 tablet by mouth 4 (four) times daily as needed for diarrhea or loose stools. 30 tablet 0  . fluconazole (DIFLUCAN) 150 MG tablet 1 tab by mouth every 3 days as needed 2 tablet 1  . fluticasone (FLONASE) 50 MCG/ACT nasal spray Place 2 sprays into both nostrils daily. 16 g 0  . furosemide (LASIX) 40 MG tablet 1 tab by mouth every day in the AM and then 1 as needed only for persistent swelling in the PM 60 tablet 11  . glucose blood (ONETOUCH VERIO) test strip USE AS DIRECTED THREE TIMES DAILY 100 strip 11  . ibuprofen (ADVIL) 800 MG tablet Take 800 mg by mouth every 6 (six) hours as needed.    . insulin aspart (NOVOLOG) 100 UNIT/ML FlexPen 12 units four times per day E11.9 15 mL 11  .  Insulin Pen Needle (BD PEN NEEDLE NANO U/F) 32G X 4 MM MISC Use as directed three times daily E11.9 300 each 3  . levalbuterol (XOPENEX HFA) 45 MCG/ACT inhaler Inhale 1-2 puffs into the lungs every 8 (eight) hours as needed for wheezing or shortness of breath. USE 2 PUFFS 3 TIMES DAILY X 5 DAYS THEN BACK TO HOME REGIMEN AS NEEDED. 1 Inhaler 0  . mometasone-formoterol (DULERA) 200-5 MCG/ACT AERO Inhale 2 puffs into the lungs 2 (two) times a day. 8.8 g 5  . nystatin (MYCOSTATIN) 100000 UNIT/ML suspension     . omeprazole (PRILOSEC) 40 MG capsule TAKE 1 CAPSULE(40 MG) BY MOUTH DAILY 90 capsule 3  . ondansetron (ZOFRAN ODT) 4 MG disintegrating tablet Take 1 tablet (4 mg total) by mouth every 8 (eight) hours as needed for nausea or vomiting. 30 tablet 1  .  oxyCODONE-acetaminophen (PERCOCET) 10-325 MG tablet Take 1 tablet by mouth every 8 (eight) hours as needed for pain.   0  . predniSONE (DELTASONE) 10 MG tablet 3 tabs by mouth per day for 3 days,2tabs per day for 3 days,1tab per day for 3 days 18 tablet 0  . rizatriptan (MAXALT-MLT) 10 MG disintegrating tablet Take 1 tablet earliest onset of migraine.  May repeat in 2 hours if needed.  Maximum 2 tablets in 24 hours (Patient taking differently: Take 10 mg by mouth every 2 (two) hours as needed for migraine. Take 1 tablet earliest onset of migraine.  May repeat in 2 hours if needed.  Maximum 2 tablets in 24 hours) 9 tablet 3  . rosuvastatin (CRESTOR) 40 MG tablet TAKE 1 TABLET(40 MG) BY MOUTH DAILY 90 tablet 2  . sertraline (ZOLOFT) 100 MG tablet TAKE 2 TABLETS(200 MG) BY MOUTH DAILY 180 tablet 3  . SYMBICORT 160-4.5 MCG/ACT inhaler Inhale 2 puffs into the lungs 2 (two) times daily.    Marland Kitchen telmisartan (MICARDIS) 40 MG tablet Take 40 mg by mouth daily.    . TURMERIC PO Take 1 tablet by mouth daily.    . vitamin B-12 (CYANOCOBALAMIN) 1000 MCG tablet Take 1,000 mcg by mouth daily.    . Vitamin D, Ergocalciferol, (DRISDOL) 1.25 MG (50000 UNIT) CAPS capsule TAKE  1 CAPSULE BY MOUTH EVERY 7 DAYS 12 capsule 0  . zonisamide (ZONEGRAN) 100 MG capsule TAKE 1 CAPSULE(100 MG) BY MOUTH DAILY 30 capsule 1   No current facility-administered medications on file prior to visit.    Observations/Objective: Alert, NAD, appropriate mood and affect, resps normal, cn 2-12 intact, moves all 4s, no visible rash or swelling Lab Results  Component Value Date   WBC 12.6 (H) 10/15/2020   HGB 13.9 10/15/2020   HCT 42.1 10/15/2020   PLT 266.0 10/15/2020   GLUCOSE 113 (H) 10/15/2020   CHOL 155 10/15/2020   TRIG 88.0 10/15/2020   HDL 58.50 10/15/2020   LDLDIRECT 135.0 07/05/2011   LDLCALC 79 10/15/2020   ALT 10 10/15/2020   AST 9 10/15/2020   NA 138 10/15/2020   K 4.1 10/15/2020   CL 104 10/15/2020   CREATININE 0.87 10/15/2020   BUN 11 10/15/2020   CO2 26 10/15/2020   TSH 1.60 10/15/2020   INR 1.3 11/17/2012   HGBA1C 6.9 (H) 10/15/2020   MICROALBUR 0.7 10/15/2020   Assessment and Plan: See notes  Follow Up Instructions: See notes   I discussed the assessment and treatment plan with the patient. The patient was provided an opportunity to ask questions and all were answered. The patient agreed with the plan and demonstrated an understanding of the instructions.   The patient was advised to call back or seek an in-person evaluation if the symptoms worsen or if the condition fails to improve as anticipated.  Cathlean Cower, MD

## 2020-11-13 NOTE — Patient Instructions (Signed)
Thank you for coming in today.  Plan to continue PT.   Let me know next week if you do not think you will be able to return to work by the 31st.   Think about what you will need to do at work to make it more comfortable.

## 2020-11-16 ENCOUNTER — Other Ambulatory Visit: Payer: Self-pay | Admitting: Internal Medicine

## 2020-11-16 ENCOUNTER — Other Ambulatory Visit: Payer: Self-pay | Admitting: Neurology

## 2020-11-17 ENCOUNTER — Telehealth: Payer: Self-pay

## 2020-11-17 NOTE — Telephone Encounter (Signed)
rx denied for [employer]. Left message at pharmacy

## 2020-11-21 DIAGNOSIS — S63501A Unspecified sprain of right wrist, initial encounter: Secondary | ICD-10-CM | POA: Diagnosis not present

## 2020-11-21 DIAGNOSIS — M5136 Other intervertebral disc degeneration, lumbar region: Secondary | ICD-10-CM | POA: Diagnosis not present

## 2020-11-21 DIAGNOSIS — M069 Rheumatoid arthritis, unspecified: Secondary | ICD-10-CM | POA: Diagnosis not present

## 2020-11-21 DIAGNOSIS — G894 Chronic pain syndrome: Secondary | ICD-10-CM | POA: Diagnosis not present

## 2020-11-23 ENCOUNTER — Encounter: Payer: Self-pay | Admitting: Internal Medicine

## 2020-11-23 NOTE — Assessment & Plan Note (Signed)
Lab Results  Component Value Date   HGBA1C 6.9 (H) 10/15/2020   Stable, pt to continue current medical treatment Novolog

## 2020-11-23 NOTE — Assessment & Plan Note (Signed)
Mild to mod, for doxycycline course,  to f/u any worsening symptoms or concerns 

## 2020-11-23 NOTE — Patient Instructions (Signed)
Please take all new medication as prescribed 

## 2020-11-23 NOTE — Assessment & Plan Note (Signed)
Stable, cont current med tx - zyrtec and flonase

## 2020-11-25 NOTE — Progress Notes (Signed)
   I, [name], LAT, ATC, am serving as scribe for Dr. Lynne Leader.  Nicole SCHLOTZHAUER is a 45 y.o. female who presents to [place] at [facility] today for f/u of R wrist pain.  She was last seen by Dr. Georgina Snell on 11/13/20 and advised to con't PT.  She was scheduled to RTW on 11/24/20.  Since her last visit, pt reports R wrist pain is worse. Pt report PT is intense and making pain worse. Pt locates pain to across carpals. Pt c/o numbness/tingling into hand and fingers and twitching of 2-4 fingers.  She also notes significant weakness with thumb opposition flexion and flexion of the PIP joint.  Diagnostic imaging: R wrist and R elbow XR- 11/03/20  Pertinent review of systems: No fevers or chills  Relevant historical information: CKD, sarcoidosis, COPD   Exam:  BP 130/88 (BP Location: Left Arm, Patient Position: Sitting, Cuff Size: Large)   Pulse 84   Ht 5\' 7"  (1.702 m)   Wt (!) 332 lb 3.2 oz (150.7 kg)   LMP 07/17/2012   SpO2 98%   BMI 52.03 kg/m  General: Well Developed, well nourished, and in no acute distress.   MSK: Right hand normal-appearing Nontender. Decreased sensation palmar radial hand. Decrease strength to thumb flexion opposition and flexion PIPs.  Forearm normal-appearing nontender.     Assessment and Plan: 45 y.o. female with hand weakness and numbness in the median nerve distribution following forearm injury about 2 months ago.  This is obviously concerning as her symptoms seem to be worsening with what I would consider to be the correct treatment.  Plan to proceed with nerve conduction study and course of prednisone.  Recommend continuing hand PT however if still worsening recommend that she provide me contact information for hand PT and I will have conversation with hand physical therapist.  Additionally is still worsening will refer to hand surgery for second opinion.  As she is worsening and now experiencing weakness remain out of work.   Recheck back with me in 1 month.   PDMP not reviewed this encounter. Orders Placed This Encounter  Procedures  . Ambulatory referral to Neurology    Referral Priority:   Routine    Referral Type:   Consultation    Referral Reason:   Specialty Services Required    Requested Specialty:   Neurology    Number of Visits Requested:   1  . NCV with EMG(electromyography)    Standing Status:   Future    Standing Expiration Date:   11/26/2021    Order Specific Question:   Where should this test be performed?    Answer:   [facility]   Meds ordered this encounter  Medications  . predniSONE (DELTASONE) 50 MG tablet    Sig: Take 1 tablet (50 mg total) by mouth daily.    Dispense:  5 tablet    Refill:  0     Discussed warning signs or symptoms. Please see discharge instructions. Patient expresses understanding.   The above documentation has been reviewed and is accurate and complete Lynne Leader, M.D.

## 2020-11-26 ENCOUNTER — Other Ambulatory Visit: Payer: Self-pay

## 2020-11-26 ENCOUNTER — Ambulatory Visit: Payer: BC Managed Care – PPO | Admitting: Family Medicine

## 2020-11-26 VITALS — BP 130/88 | HR 84 | Ht 67.0 in | Wt 332.2 lb

## 2020-11-26 DIAGNOSIS — R202 Paresthesia of skin: Secondary | ICD-10-CM | POA: Diagnosis not present

## 2020-11-26 DIAGNOSIS — R29898 Other symptoms and signs involving the musculoskeletal system: Secondary | ICD-10-CM

## 2020-11-26 DIAGNOSIS — R200 Anesthesia of skin: Secondary | ICD-10-CM

## 2020-11-26 MED ORDER — PREDNISONE 50 MG PO TABS: 50.0000 mg | ORAL_TABLET | Freq: Every day | ORAL | 0 refills | Status: DC

## 2020-11-26 NOTE — Patient Instructions (Addendum)
Thank you for coming in today.  Plan for nerve study.   Plan for short course of prednisone.   If worsening I will refer to hand surgery for a second opinion.   Your median nerve seems to be affected.   Recheck in about 1 month

## 2020-11-28 ENCOUNTER — Encounter: Payer: Self-pay | Admitting: Family Medicine

## 2020-11-28 NOTE — Telephone Encounter (Signed)
Letter sent via email.

## 2020-12-03 ENCOUNTER — Telehealth: Payer: Self-pay | Admitting: Family Medicine

## 2020-12-03 ENCOUNTER — Encounter: Payer: Self-pay | Admitting: Family Medicine

## 2020-12-03 NOTE — Telephone Encounter (Signed)
My apologies.  The referral in order should be to [facility] neurology.  Please change that.  Please let me know if you need me to put a new order in

## 2020-12-03 NOTE — Telephone Encounter (Signed)
Called pt to inquire about Dr. Tomi Likens and referral that was placed to [facility] most recently.  Pt states that if Dr. Georgie Chard office can see her sooner, then she would prefer to see Dr. Tomi Likens instead of switching to a new neurology group.  Please advise how to proceed.  Does pt need to call [facility] and get an appt since she's been seen there previously?  Do we need to change referral?

## 2020-12-03 NOTE — Telephone Encounter (Signed)
[facility] called stating that they received the referral for the patient but after going through the patients chart, it looks like she is well established with Dr. Tomi Likens at [facility]. Did you want to patient to continue care with Dr Tomi Likens or is she wanted to be seen at [facility]?

## 2020-12-03 NOTE — Telephone Encounter (Signed)
Please advise 

## 2020-12-04 ENCOUNTER — Encounter: Payer: Self-pay | Admitting: Family Medicine

## 2020-12-04 NOTE — Telephone Encounter (Signed)
Called pt and advised her that her referral has been changed to [facility] and that she can call Dr. Georgie Chard office to schedule.

## 2020-12-04 NOTE — Telephone Encounter (Signed)
I have changed referral to [facility] neurolog[facility]

## 2020-12-04 NOTE — Addendum Note (Signed)
Addended by: Pollyann Glen on: 12/04/2020 08:20 AM   Modules accepted: Orders

## 2020-12-05 ENCOUNTER — Encounter: Payer: Self-pay | Admitting: Neurology

## 2020-12-05 ENCOUNTER — Other Ambulatory Visit: Payer: Self-pay | Admitting: Internal Medicine

## 2020-12-07 ENCOUNTER — Encounter: Payer: Self-pay | Admitting: Family Medicine

## 2020-12-08 ENCOUNTER — Encounter: Payer: Self-pay | Admitting: Family Medicine

## 2020-12-08 MED ORDER — NYSTATIN 100000 UNIT/ML MT SUSP: 500000.0000 [IU] | Freq: Four times a day (QID) | OROMUCOSAL | 0 refills | Status: DC

## 2020-12-08 NOTE — Telephone Encounter (Signed)
Nystatin prescribed

## 2020-12-10 ENCOUNTER — Telehealth: Payer: Self-pay | Admitting: Internal Medicine

## 2020-12-10 NOTE — Telephone Encounter (Signed)
[employer] calling to let us know of the patients Nursing Case Manager in case we need to reach out to her. Her name is Marica Otter and her number is [phone]

## 2020-12-12 ENCOUNTER — Other Ambulatory Visit: Payer: Self-pay | Admitting: Internal Medicine

## 2020-12-12 NOTE — Telephone Encounter (Signed)
Please refill as per office routine med refill policy (all routine meds refilled for 3 mo or monthly per pt preference up to one year from last visit, then month to month grace period for 3 mo, then further med refills will have to be denied)  

## 2020-12-16 ENCOUNTER — Other Ambulatory Visit: Payer: Self-pay | Admitting: Neurology

## 2020-12-16 ENCOUNTER — Other Ambulatory Visit: Payer: Self-pay | Admitting: Internal Medicine

## 2020-12-18 ENCOUNTER — Telehealth: Payer: Self-pay | Admitting: Neurology

## 2020-12-18 NOTE — Telephone Encounter (Signed)
Telephone call to pt, please verify what medication you need to have refilled.    Per pt, She needs to have her Zonisamide 100 mg.    Advised pt that medication was denied due to no f/u appt scheduled. Pt hasn't been seen since 08/2019.  Please refer to PCP to see if they can refill the medication until she can come in to see Dr.Jaffe.

## 2020-12-19 DIAGNOSIS — M47817 Spondylosis without myelopathy or radiculopathy, lumbosacral region: Secondary | ICD-10-CM | POA: Diagnosis not present

## 2020-12-19 DIAGNOSIS — G894 Chronic pain syndrome: Secondary | ICD-10-CM | POA: Diagnosis not present

## 2020-12-19 DIAGNOSIS — M5136 Other intervertebral disc degeneration, lumbar region: Secondary | ICD-10-CM | POA: Diagnosis not present

## 2020-12-19 DIAGNOSIS — M069 Rheumatoid arthritis, unspecified: Secondary | ICD-10-CM | POA: Diagnosis not present

## 2020-12-22 NOTE — Progress Notes (Signed)
NEUROLOGY FOLLOW UP OFFICE NOTE  Nicole Melendez [id number]  Assessment/Plan:   1.  Migraine without aura, without status migrainosus, not intractable 2.  Right lumbar radiculitis, following radiofrequency neurolysis  1.  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  Restart zonisamide titrating to 243m daily 3.  Refilled rizatriptan 4.  Limit use of pain relievers to no more than 2 days out of week to prevent risk of rebound or medication-overuse headache. 5.  Keep headache diary 6.  Follow up in one year  Subjective:  Nicole Prigmoreis a 45year old right-handed female with sarcoidosis, diabetes with neuropathy, GERD, PUD, HTN, and hyperlipidemia who follows up for migraines and lumbar radiculopathy  MIGRAINE: Update: Intensity:  7/10 Duration:  Usually 1 hour with rizatriptan 164m Frequency:  1 every 2 to 3 months Ran out of zonisamide and rizatriptan and has had daily headache since then.   Current NSAIDS:None (has PUD) Current analgesics:Tylenol Current triptans:rizatriptan 1058murrent ergotamine:None Current anti-emetic:Zofran 4 mg Current muscle relaxants:None Current anti-anxiolytic:Alprazolam, hydroxyzine Current sleep aide:None Current Antihypertensive medications:Lasix Current Antidepressant medications:Sertraline Current Anticonvulsant medications:Zonisamide 200 mg, gabapentin 1200m21mD   Current anti-CGRP:None Current Vitamins/Herbal/Supplements:Folic acid, D3 Current Antihistamines/Decongestants:Zyrtec Other therapy:None Hormone/birth control:None  Caffeine:No Alcohol:No Smoker:No Diet:Hydrates, baked chicken, steamed vegetables Exercise:Yes Depression:Yes; Anxiety:Yes. Both controlled. Other pain:back pain Sleep hygiene:Good  History: Onset:  Mid-30s Location: Back of head, right sided Quality: pounding Initial Intensity: 8-9/10 Aura: no Prodrome: no Associated symptoms: Nausea, vomiting,  osmophobia, right eye blurred vision, sometimes photophobia. There is no associated unilateral numbness or weakness. Initial Duration: 2 days (within one hour with sumatriptan) Initial Frequency: Varies. Some months 1-2 days, some months 10 days Triggers:  Unknown Relieving factors: no Activity: Cannot function 1 to 2 days per month  Past NSAIDS: Ibuprofen, naproxen (discontinued due to stomach ulcers) Past analgesics: Percocet, tramadol (for back pain) Past abortive triptans: sumatriptan 100mg32mt muscle relaxants: tizanidine 4mg P74m anti-emetic: Phenergan Past antihypertensive medications: no Past antidepressant medications:Wellbutrin Past anticonvulsant medications: topiramate (caused nausea and vomiting) Past vitamins/Herbal/Supplements: no Other past therapies: meditation Family history of headache: mother  RIGHT LUMBAR RADICULOPATHY: Update: Followed by pain management.  Receiving injections in the back.  Right leg is becoming stronger.  Walking over a mile a day.    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 On 03/14/19, she underwent radiofrequency neurolysis of the left lumbar facet nerves at L2-L3, L3-L4, L4-L5 and L5-S1 levels. She had no complications. On 04/23/19, she underwent the same procedure on the right side at the same levels. She noted some right leg weakness afterwards with some pain. It progressively got worse. When she is on her feet, the right lower back pain increases and her leg gives out but no leg pain. No numbness in right leg but notes new numbness in the big toe of the left foot. Due to the weakness, she started having falls. She also notes that sometimes when wakes up in the morning, she has trouble making it to the bathroom to urinate. She has had incontinence. No perineal numbness. She now resorts to using a cane to ambulate. She had NCV-EMG of the lower extremities on 05/22/19, which  demonstrated diminished amplitude of both tibaial motor nerves and absent F-wave response in the right peroneal motor nerve but otherwise unremarkable with no explanation for symptoms. MRI of lumbar spine without contrast on 06/09/19 showed stable degenerative disc disease and facet arthropathy with mild bilateral neuroforaminal  stenosis at L4-L5 and moderate neuroforaminal stenosis at L5-S1, but no new structural explanation for symptoms. Since this started, she reports increased migraines.   PAST MEDICAL HISTORY: Past Medical History:  Diagnosis Date  . Allergic rhinitis, cause unspecified   . Anemia   . Anxiety   . Depression   . Diabetes mellitus type II    steroid related, patient states "im no longer diabetic"  . Essential hypertension 08/08/2015  . GERD (gastroesophageal reflux disease)   . History of blood transfusion   . Hyperlipidemia   . Menorrhagia   . Migraine   . Morbid obesity ([place])   . Otitis media 06/28/2015  . Peptic ulcer disease   . Positive ANA (antinuclear antibody) 02/14/2012  . Sarcoid    including hand per rheumatology-Dr. Amil Amen  . Sarcoidosis of lung ([place])   . Shortness of breath    on exertion  . Varicose veins with pain     MEDICATIONS: Current Outpatient Medications on File Prior to Visit  Medication Sig Dispense Refill  . acetaminophen (TYLENOL) 650 MG CR tablet Take 650 mg by mouth every 8 (eight) hours as needed for pain.    Marland Kitchen albuterol (VENTOLIN HFA) 108 (90 Base) MCG/ACT inhaler INHALE 2 PUFFS INTO THE LUNGS EVERY 4 HOURS AS NEEDED FOR WHEEZING OR SHORTNESS OF BREATH 8.5 g 0  . ALPRAZolam (XANAX) 0.5 MG tablet TAKE 1 TABLET(0.5 MG) BY MOUTH THREE TIMES DAILY AS NEEDED 90 tablet 2  . azelastine (ASTELIN) 0.1 % nasal spray Place 2 sprays into both nostrils 2 (two) times daily. Use in each nostril as directed 30 mL 12  . Blood Glucose Monitoring Suppl (ONETOUCH VERIO) w/Device KIT Use as directed daily E11.9 1 kit 0  . celecoxib (CELEBREX) 200 MG  capsule Take 1 capsule (200 mg total) by mouth 2 (two) times daily as needed. 60 capsule 2  . cetirizine (ZYRTEC) 10 MG tablet TAKE 1 TABLET BY MOUTH DAILY 90 tablet 1  . colchicine 0.6 MG tablet Take 1 tablet (0.6 mg total) by mouth daily. 60 tablet 5  . cyclobenzaprine (FLEXERIL) 5 MG tablet TAKE 1 TABLET(5 MG) BY MOUTH THREE TIMES DAILY AS NEEDED FOR MUSCLE SPASMS 40 tablet 2  . Diclofenac Sodium (PENNSAID) 2 % SOLN Apply 1 pump twice daily as needed. (Patient taking differently: Apply 1 Pump topically 2 (two) times daily. Apply 1 pump twice daily) 112 g 3  . diphenoxylate-atropine (LOMOTIL) 2.5-0.025 MG tablet Take 1 tablet by mouth 4 (four) times daily as needed for diarrhea or loose stools. 30 tablet 0  . doxycycline (VIBRA-TABS) 100 MG tablet Take 1 tablet (100 mg total) by mouth 2 (two) times daily. 20 tablet 0  . fluconazole (DIFLUCAN) 150 MG tablet 1 tab by mouth every 3 days as needed 2 tablet 1  . fluticasone (FLONASE) 50 MCG/ACT nasal spray Place 2 sprays into both nostrils daily. 16 g 0  . furosemide (LASIX) 40 MG tablet 1 tab by mouth every day in the AM and then 1 as needed only for persistent swelling in the PM 60 tablet 11  . gabapentin (NEURONTIN) 300 MG capsule TAKE 2 CAPSULES(600 MG) BY MOUTH THREE TIMES DAILY 540 capsule 1  . glucose blood (ONETOUCH VERIO) test strip USE AS DIRECTED THREE TIMES DAILY 100 strip 11  . ibuprofen (ADVIL) 800 MG tablet Take 800 mg by mouth every 6 (six) hours as needed.    . insulin aspart (NOVOLOG) 100 UNIT/ML FlexPen 12 units four times per day E11.9  15 mL 11  . Insulin Pen Needle (BD PEN NEEDLE NANO U/F) 32G X 4 MM MISC Use as directed three times daily E11.9 300 each 3  . levalbuterol (XOPENEX HFA) 45 MCG/ACT inhaler Inhale 1-2 puffs into the lungs every 8 (eight) hours as needed for wheezing or shortness of breath. USE 2 PUFFS 3 TIMES DAILY X 5 DAYS THEN BACK TO HOME REGIMEN AS NEEDED. 1 Inhaler 0  . mometasone-formoterol (DULERA) 200-5 MCG/ACT  AERO Inhale 2 puffs into the lungs 2 (two) times a day. 8.8 g 5  . nystatin (MYCOSTATIN) 100000 UNIT/ML suspension Take 5 mLs (500,000 Units total) by mouth 4 (four) times daily. Swish for 30 seconds and swallow 180 mL 0  . omeprazole (PRILOSEC) 40 MG capsule TAKE 1 CAPSULE(40 MG) BY MOUTH DAILY 90 capsule 3  . ondansetron (ZOFRAN ODT) 4 MG disintegrating tablet Take 1 tablet (4 mg total) by mouth every 8 (eight) hours as needed for nausea or vomiting. 30 tablet 1  . oxyCODONE-acetaminophen (PERCOCET) 10-325 MG tablet Take 1 tablet by mouth every 8 (eight) hours as needed for pain.   0  . predniSONE (DELTASONE) 50 MG tablet Take 1 tablet (50 mg total) by mouth daily. 5 tablet 0  . rizatriptan (MAXALT-MLT) 10 MG disintegrating tablet Take 1 tablet earliest onset of migraine.  May repeat in 2 hours if needed.  Maximum 2 tablets in 24 hours (Patient taking differently: Take 10 mg by mouth every 2 (two) hours as needed for migraine. Take 1 tablet earliest onset of migraine.  May repeat in 2 hours if needed.  Maximum 2 tablets in 24 hours) 9 tablet 3  . rosuvastatin (CRESTOR) 40 MG tablet TAKE 1 TABLET(40 MG) BY MOUTH DAILY 90 tablet 2  . sertraline (ZOLOFT) 100 MG tablet TAKE 2 TABLETS(200 MG) BY MOUTH DAILY 180 tablet 3  . SYMBICORT 160-4.5 MCG/ACT inhaler Inhale 2 puffs into the lungs 2 (two) times daily.    Marland Kitchen telmisartan (MICARDIS) 40 MG tablet Take 40 mg by mouth daily.    . TURMERIC PO Take 1 tablet by mouth daily.    . vitamin B-12 (CYANOCOBALAMIN) 1000 MCG tablet Take 1,000 mcg by mouth daily.    . Vitamin D, Ergocalciferol, (DRISDOL) 1.25 MG (50000 UNIT) CAPS capsule TAKE 1 CAPSULE BY MOUTH EVERY 7 DAYS 12 capsule 0  . zonisamide (ZONEGRAN) 100 MG capsule TAKE 1 CAPSULE(100 MG) BY MOUTH DAILY 30 capsule 1   No current facility-administered medications on file prior to visit.    ALLERGIES: Allergies  Allergen Reactions  . Azithromycin Hives    (z pak) hives  . Bee Venom Anaphylaxis  .  Flagyl [Metronidazole] Hives and Shortness Of Breath  . Penicillins Shortness Of Breath and Swelling    Has patient had a PCN reaction causing immediate rash, facial/tongue/throat swelling, SOB or lightheadedness with hypotension: Yes Has patient had a PCN reaction causing severe rash involving mucus membranes or skin necrosis: No Has patient had a PCN reaction that required hospitalization No Has patient had a PCN reaction occurring within the last 10 years: No If all of the above answers are "NO", then may proceed with Cephalosporin use.  REACTION: swelling and difficulty breathing  . Shellfish Allergy Anaphylaxis  . Klonopin [Clonazepam] Other (See Comments)    Memory difficulty    FAMILY HISTORY: Family History  Problem Relation Age of Onset  . Allergies Mother   . Heart attack Mother   . Diabetes Mother   . Diabetes Father   .  Heart disease Father   . Rheum arthritis Father   . Stroke Father   . Hypertension Other   . Ovarian cancer Maternal Aunt   . Lung cancer Maternal Aunt   . Breast cancer Maternal Aunt   . Bone cancer Maternal Aunt   . Stroke Maternal Uncle   . Other Neg Hx      Objective:  Blood pressure (!) 148/94, pulse 100, resp. rate 18, height '5\' 7"'  (1.702 m), weight (!) 334 lb (151.5 kg), last menstrual period 07/17/2012, SpO2 97 %. General: No acute distress.  Patient appears well-groomed.   Head:  Normocephalic/atraumatic Eyes:  Fundi examined but not visualized Neck: supple, no paraspinal tenderness, full range of motion Heart:  Regular rate and rhythm Lungs:  Clear to auscultation bilaterally Back: No paraspinal tenderness Neurological Exam: alert and oriented to person, place, and time. Attention span and concentration intact, recent and remote memory intact, fund of knowledge intact.  Speech fluent and not dysarthric, language intact.  CN II-XII intact. Bulk and tone normal, muscle strength 5/5 throughout.  Sensation to light touch  intact.  Deep  tendon reflexes 2+ throughout, toes downgoing.  Finger to nose and heel to shin testing intact.  Gait normal, Romberg negative.  Metta Clines, DO  CC: Cathlean Cower, MD

## 2020-12-23 ENCOUNTER — Ambulatory Visit: Payer: BC Managed Care – PPO | Admitting: Neurology

## 2020-12-23 ENCOUNTER — Other Ambulatory Visit: Payer: Self-pay

## 2020-12-23 ENCOUNTER — Encounter: Payer: Self-pay | Admitting: Neurology

## 2020-12-23 VITALS — BP 148/94 | HR 100 | Resp 18 | Ht 67.0 in | Wt 334.0 lb

## 2020-12-23 DIAGNOSIS — M5416 Radiculopathy, lumbar region: Secondary | ICD-10-CM

## 2020-12-23 DIAGNOSIS — G43009 Migraine without aura, not intractable, without status migrainosus: Secondary | ICD-10-CM | POA: Diagnosis not present

## 2020-12-23 MED ORDER — METOCLOPRAMIDE HCL 5 MG/ML IJ SOLN
10.0000 mg | Freq: Once | INTRAMUSCULAR | Status: AC
  Administered 2020-12-23: 10 mg via INTRAMUSCULAR

## 2020-12-23 MED ORDER — DIPHENHYDRAMINE HCL 50 MG/ML IJ SOLN
25.0000 mg | Freq: Once | INTRAMUSCULAR | Status: AC
  Administered 2020-12-23: 25 mg via INTRAMUSCULAR

## 2020-12-23 MED ORDER — KETOROLAC TROMETHAMINE 60 MG/2ML IM SOLN
60.0000 mg | Freq: Once | INTRAMUSCULAR | Status: AC
  Administered 2020-12-23: 60 mg via INTRAMUSCULAR

## 2020-12-23 MED ORDER — RIZATRIPTAN BENZOATE 10 MG PO TBDP: ORAL_TABLET | ORAL | 5 refills | Status: DC

## 2020-12-23 MED ORDER — ZONISAMIDE 100 MG PO CAPS: 200.0000 mg | ORAL_CAPSULE | Freq: Every day | ORAL | 5 refills | Status: DC

## 2020-12-23 NOTE — Patient Instructions (Signed)
1.  Restart zonisamide 100mg  daily for a week, then increase to 200mg  daily 2.  Refilled rizatriptan.  Limit use of pain relievers to no more than 2 days out of week to prevent risk of rebound or medication-overuse headache. 3.  Keep headache diary 4.  Follow up one year

## 2020-12-24 ENCOUNTER — Encounter: Payer: Self-pay | Admitting: Family Medicine

## 2020-12-24 NOTE — Progress Notes (Signed)
   I, [name], LAT, ATC, am serving as scribe for Dr. Lynne Leader.  Nicole Melendez is a 45 y.o. female who presents to [place] at [facility] today for f/u of R wrist/hand pain.  She was last seen by Dr. Georgina Snell on 11/26/20 noting that her R wrist pain was worsening w/ PT and noted numbness/tingling into her R hand and fingers.  She was prescribed a short course of prednisone and was referred to neurology for an EMG/NCV. Since her last visit, pt reports fingers and wrist are locking up on her. Pt has been trying to get back to typing, but after about 25 words fingers and wrist will cramp and become painful and difficult to use. Pt reports fingers 2-4 are painful and locking.   Nerve conduction study was ordered and is scheduled for next week.  Treatments tried: Vick vapo rub, PT, HEP  Diagnostic testing: R elbow and R wrist XR- 11/03/20  Pertinent review of systems: No fevers or chills  Relevant historical information: Heart failure, sarcoidosis, COPD   Exam:  BP (!) 146/100 (BP Location: Right Arm, Patient Position: Sitting, Cuff Size: Large)   Pulse 90   Ht 5\' 7"  (1.702 m)   Wt (!) 338 lb 9.6 oz (153.6 kg)   LMP 07/17/2012   SpO2 97%   BMI 53.03 kg/m  General: Well Developed, well nourished, and in no acute distress.   MSK: Right hand and wrist decreased muscle bulk forearm and into the thenar aspect of the hand.  Normal hand motion some difficulty with full grip motion. Strength is intact. Incision is intact. No triggering present.      Assessment and Plan: 45 y.o. female with right hand and wrist pain after fall.  Unclear fundamental etiology.  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  She has tried a limited return to work at home and unable to type faster than 25 words per minute.  Fortunately she does have the next steps already planned and arranged which is a nerve conduction study.  Very  likely in response to that she will have a second opinion with hand surgery and likely will also have a retry of hand physical therapy.  She may not require a follow-up visit to go over the results of the nerve conduction study but I still probably will offer it at least.  Remain out of work for 1 to 2 months.  Happy to fill out any paperwork if needed.    Discussed warning signs or symptoms. Please see discharge instructions. Patient expresses understanding.   The above documentation has been reviewed and is accurate and complete Lynne Leader, M.D.    Total encounter time 20 minutes including face-to-face time with the patient and, reviewing past medical record, and charting on the date of service.   Treatment plan and options

## 2020-12-25 ENCOUNTER — Ambulatory Visit: Payer: Self-pay

## 2020-12-25 ENCOUNTER — Ambulatory Visit: Payer: BC Managed Care – PPO | Admitting: Family Medicine

## 2020-12-25 ENCOUNTER — Other Ambulatory Visit: Payer: Self-pay

## 2020-12-25 VITALS — BP 146/100 | HR 90 | Ht 67.0 in | Wt 338.6 lb

## 2020-12-25 DIAGNOSIS — M79644 Pain in right finger(s): Secondary | ICD-10-CM | POA: Diagnosis not present

## 2020-12-25 NOTE — Patient Instructions (Signed)
Thank you for coming in today.  Anticipate referral to hand surgery and therapy after nerve test.

## 2020-12-30 ENCOUNTER — Other Ambulatory Visit: Payer: Self-pay

## 2020-12-30 ENCOUNTER — Ambulatory Visit (INDEPENDENT_AMBULATORY_CARE_PROVIDER_SITE_OTHER): Payer: BC Managed Care – PPO | Admitting: Neurology

## 2020-12-30 DIAGNOSIS — R29898 Other symptoms and signs involving the musculoskeletal system: Secondary | ICD-10-CM | POA: Diagnosis not present

## 2020-12-30 DIAGNOSIS — R200 Anesthesia of skin: Secondary | ICD-10-CM

## 2020-12-30 DIAGNOSIS — M5412 Radiculopathy, cervical region: Secondary | ICD-10-CM

## 2020-12-30 NOTE — Procedures (Signed)
[facility] Neurology  [address], [address]  [place], [place] [address] Tel: [phone] Fax:  [phone] Test Date:  12/30/2020  Patient: Nicole Melendez DOB: 1976-01-15 Physician: Narda Amber, DO  Sex: Female Height: 5\' 7"  Ref Phys: Lynne Leader, M.D.  ID#: [id number]   Technician:    Patient Complaints: This is a 45 year old female referred for evaluation of right arm numbness and tingling.  NCV & EMG Findings: Extensive electrodiagnostic testing of the right upper extremity shows:  1. Right median, ulnar, and mixed palmar sensory responses are within normal limits. 2. Right median and ulnar motor responses are within normal limits. 3. Chronic motor axonal loss changes are seen affecting the right C8 myotomes, without accompanied active denervation.  Impression: 1. Chronic C8 radiculopathy affecting the right upper extremity, mild. 2. There is no evidence of a right carpal tunnel syndrome or ulnar neuropathy.   ___________________________ Narda Amber, DO    Nerve Conduction Studies Anti Sensory Summary Table   Stim Site NR Peak (ms) Norm Peak (ms) P-T Amp (V) Norm P-T Amp  Right Median Anti Sensory (2nd Digit)  33C  Wrist    3.2 <3.4 41.1 >20  Right Ulnar Anti Sensory (5th Digit)  33C  Wrist    2.8 <3.1 38.4 >12   Motor Summary Table   Stim Site NR Onset (ms) Norm Onset (ms) O-P Amp (mV) Norm O-P Amp Site1 Site2 Delta-0 (ms) Dist (cm) Vel (m/s) Norm Vel (m/s)  Right Median Motor (Abd Poll Brev)  33C  Wrist    3.0 <3.9 8.8 >6 Elbow Wrist 5.3 29.0 55 >50  Elbow    8.3  8.6         Right Ulnar Motor (Abd Dig Minimi)  33C  Wrist    2.9 <3.1 7.5 >7 B Elbow Wrist 3.7 24.0 65 >50  B Elbow    6.6  7.0  A Elbow B Elbow 1.4 10.0 71 >50  A Elbow    8.0  6.8          Comparison Summary Table   Stim Site NR Peak (ms) Norm Peak (ms) P-T Amp (V) Site1 Site2 Delta-P (ms) Norm Delta (ms)  Right Median/Ulnar Palm Comparison (Wrist - 8cm)  33C  Median Palm    1.9  <2.2 54.7 Median Palm Ulnar Palm 0.3   Ulnar Palm    1.6 <2.2 25.2       EMG   Side Muscle Ins Act Fibs Psw Fasc Number Recrt Dur Dur. Amp Amp. Poly Poly. Comment  Right 1stDorInt Nml Nml Nml Nml 1- Rapid Some 1+ Some 1+ Some 1+ N/A  Right Abd Poll Brev Nml Nml Nml Nml Nml Nml Nml Nml Nml Nml Nml Nml N/A  Right PronatorTeres Nml Nml Nml Nml Nml Nml Nml Nml Nml Nml Nml Nml N/A  Right Biceps Nml Nml Nml Nml Nml Nml Nml Nml Nml Nml Nml Nml N/A  Right Triceps Nml Nml Nml Nml 1- Rapid Few 1+ Few 1+ Few 1+ N/A  Right Deltoid Nml Nml Nml Nml Nml Nml Nml Nml Nml Nml Nml Nml N/A  Right Ext Indicis Nml Nml Nml Nml 1- Rapid Some 1+ Some 1+ Some 1+ N/A      Waveforms:

## 2021-01-01 NOTE — Progress Notes (Signed)
Nerve conduction study shows evidence of injury to the nerve root in your neck that affects the arm but not much injury to the arm nerves itself.  I was not expecting this.  Schedule follow-up appoint with me this upcoming week.

## 2021-01-06 ENCOUNTER — Encounter: Payer: BC Managed Care – PPO | Admitting: Internal Medicine

## 2021-01-06 ENCOUNTER — Ambulatory Visit: Payer: BC Managed Care – PPO | Admitting: Neurology

## 2021-01-06 NOTE — Progress Notes (Signed)
I, Peterson Lombard, LAT, ATC acting as a scribe for Lynne Leader, MD.  Nicole Melendez is a 45 y.o. female who presents to [place] at [facility] today for R wrist/hand pain. Pt was last seen by Dr. Georgina Snell on 12/25/20 and reported fingers and wrist started "locking up" on her when trying to type. At last visit, pt was advised to plan for nerve conduction study and probable referral for a second opinion w/ hand surgery. Today, pt reports hand is feeling weak and is dropping items. Pt is still experiencing numbness/tingling in fingers and locking.  Can type 27 words a min at home before the hand cramps.   Wants to try to return to work Wednesday.   Dx testing: 12/30/20 R UE NCV/EMG   11/03/20 R elbow XR & R wrist XR  Pertinent review of systems: No fevers or chills  Relevant historical information: Hypertension, heart failure, COPD   Exam:  BP (!) 142/90 (BP Location: Right Arm, Patient Position: Sitting, Cuff Size: Large)   Pulse 68   Ht 5\' 7"  (1.702 m)   Wt (!) 334 lb (151.5 kg)   LMP 07/17/2012   SpO2 98%   BMI 52.31 kg/m  General: Well Developed, well nourished, and in no acute distress.   MSK: Right hand wearing gloves but otherwise normal-appearing normal motion.  Intact strength.    Lab and Radiology Results  Cervical spine x-ray images obtained today personally independently interpreted Significant cervical DDD C6-C7 C7-T1 Await formal radiology review  Nerve conduction study dated December 30, 2020 Patient Complaints: This is a 45 year old female referred for evaluation of right arm numbness and tingling.  NCV & EMG Findings: Extensive electrodiagnostic testing of the right upper extremity shows:  1. Right median, ulnar, and mixed palmar sensory responses are within normal limits. 2. Right median and ulnar motor responses are within normal limits. 3. Chronic motor axonal loss changes are seen affecting the right C8 myotomes, without accompanied active  denervation.  Impression: 1. Chronic C8 radiculopathy affecting the right upper extremity, mild. 2. There is no evidence of a right carpal tunnel syndrome or ulnar neuropathy.   ___________________________ Narda Amber, DO       Assessment and Plan: 45 y.o. female with right arm weakness after fall.  Originally thought to be more peripheral neuropathy type changes.  However based on nerve conduction study patient has C8 cervical radicular component.  Patient does have significant abnormality on cervical spine x-ray at this level seen on images obtained today.  Radiology overread is still pending however.  Fortunately she is slowly improving which is reassuring.  Plan for graduated return to work attempted starting March 23.  Will start half days for 2 weeks and attempt full return to work thereafter.  If not improving consider MRI of C-spine to further characterize cause of pain.   PDMP not reviewed this encounter. Orders Placed This Encounter  Procedures  . DG Cervical Spine 2 or 3 views    Standing Status:   Future    Number of Occurrences:   1    Standing Expiration Date:   01/07/2022    Order Specific Question:   Reason for Exam (SYMPTOM  OR DIAGNOSIS REQUIRED)    Answer:   eval c8 radiculopathy    Order Specific Question:   Is patient pregnant?    Answer:   No    Order Specific Question:   Preferred imaging location?    Answer:   [facility]  No orders of the defined types were placed in this encounter.    Discussed warning signs or symptoms. Please see discharge instructions. Patient expresses understanding.   The above documentation has been reviewed and is accurate and complete Lynne Leader, M.D.

## 2021-01-07 ENCOUNTER — Ambulatory Visit (INDEPENDENT_AMBULATORY_CARE_PROVIDER_SITE_OTHER): Payer: BC Managed Care – PPO | Admitting: Family Medicine

## 2021-01-07 ENCOUNTER — Other Ambulatory Visit: Payer: Self-pay

## 2021-01-07 ENCOUNTER — Encounter: Payer: Self-pay | Admitting: Family Medicine

## 2021-01-07 ENCOUNTER — Ambulatory Visit (INDEPENDENT_AMBULATORY_CARE_PROVIDER_SITE_OTHER): Payer: BC Managed Care – PPO

## 2021-01-07 VITALS — BP 142/90 | HR 68 | Ht 67.0 in | Wt 334.0 lb

## 2021-01-07 DIAGNOSIS — M5412 Radiculopathy, cervical region: Secondary | ICD-10-CM

## 2021-01-07 DIAGNOSIS — R29898 Other symptoms and signs involving the musculoskeletal system: Secondary | ICD-10-CM

## 2021-01-07 DIAGNOSIS — R202 Paresthesia of skin: Secondary | ICD-10-CM

## 2021-01-07 DIAGNOSIS — M542 Cervicalgia: Secondary | ICD-10-CM | POA: Diagnosis not present

## 2021-01-07 DIAGNOSIS — R200 Anesthesia of skin: Secondary | ICD-10-CM

## 2021-01-07 NOTE — Patient Instructions (Addendum)
Thank you for coming in today.  Please get an Xray today before you leave  Graduated return to work on [employer].   Let me know if there is a problem.   Recheck in 1 month.

## 2021-01-08 ENCOUNTER — Encounter: Payer: Self-pay | Admitting: Family Medicine

## 2021-01-08 NOTE — Progress Notes (Signed)
X-ray cervical spine shows arthritis and degenerative disease at cervical spine at C4-C7.

## 2021-01-12 ENCOUNTER — Encounter: Payer: BC Managed Care – PPO | Admitting: Internal Medicine

## 2021-01-13 ENCOUNTER — Encounter: Payer: Self-pay | Admitting: Family Medicine

## 2021-01-13 NOTE — Telephone Encounter (Signed)
Spoke to patient. Form needed to be revised completed, faxed, sent to scan and a copy kept in office.

## 2021-01-14 ENCOUNTER — Encounter: Payer: Self-pay | Admitting: Family Medicine

## 2021-01-15 ENCOUNTER — Other Ambulatory Visit: Payer: Self-pay | Admitting: Family Medicine

## 2021-01-16 ENCOUNTER — Encounter: Payer: Self-pay | Admitting: Family Medicine

## 2021-01-16 DIAGNOSIS — M069 Rheumatoid arthritis, unspecified: Secondary | ICD-10-CM | POA: Diagnosis not present

## 2021-01-16 DIAGNOSIS — M47817 Spondylosis without myelopathy or radiculopathy, lumbosacral region: Secondary | ICD-10-CM | POA: Diagnosis not present

## 2021-01-16 DIAGNOSIS — Z79891 Long term (current) use of opiate analgesic: Secondary | ICD-10-CM | POA: Diagnosis not present

## 2021-01-16 DIAGNOSIS — G894 Chronic pain syndrome: Secondary | ICD-10-CM | POA: Diagnosis not present

## 2021-01-16 DIAGNOSIS — M5136 Other intervertebral disc degeneration, lumbar region: Secondary | ICD-10-CM | POA: Diagnosis not present

## 2021-01-19 ENCOUNTER — Ambulatory Visit (INDEPENDENT_AMBULATORY_CARE_PROVIDER_SITE_OTHER): Payer: BC Managed Care – PPO | Admitting: Family Medicine

## 2021-01-19 ENCOUNTER — Other Ambulatory Visit: Payer: Self-pay

## 2021-01-19 VITALS — BP 130/88 | HR 96 | Ht 67.0 in | Wt 334.8 lb

## 2021-01-19 DIAGNOSIS — R29898 Other symptoms and signs involving the musculoskeletal system: Secondary | ICD-10-CM

## 2021-01-19 DIAGNOSIS — M5412 Radiculopathy, cervical region: Secondary | ICD-10-CM

## 2021-01-19 NOTE — Progress Notes (Unsigned)
I, Peterson Lombard, LAT, ATC acting as a scribe for Lynne Leader, MD.  Nicole Melendez is a 45 y.o. female who presents to [place] at [facility] today for continued R hand/wrist pain. Pt was last seen by Dr. Georgina Snell on 01/07/21 and was advised to attempt graduated return to work starting 3/23, w/ half-days for 2 weeks. Today, pt reports hand, wrist, and fingers have been locked in current position for the past 4 days. Pt reports it is extremely painful to "pop" joints to move. Pt notes numbness/tinglingness continues.  Dx testing: 01/07/21 C-spine XR  12/30/20 R UE NCV/EMG              11/03/20 R elbow XR & R wrist XR  Pertinent review of systems: No fevers or chills  Relevant historical information: Pretension   Exam:  BP 130/88 (BP Location: Right Arm, Patient Position: Sitting, Cuff Size: Normal)   Pulse 96   Ht 5\' 7"  (1.702 m)   Wt (!) 334 lb 12.8 oz (151.9 kg)   LMP 07/17/2012   SpO2 97%   BMI 52.44 kg/m  General: Well Developed, well nourished, and in no acute distress.   MSK: Right hand normal-appearing Normal motion without mechanical popping or triggering. Stiffness is present. Decreased sensation ulnar aspect hand. Grip strength reduced right hand compared to left.    Lab and Radiology Results  EXAM: CERVICAL SPINE - 2-3 VIEW  COMPARISON:  Plain film cervical spine 11/21/2015.  FINDINGS: Straightening of the normal cervical lordosis is unchanged. No malalignment or fracture. Marked loss of disc space height and endplate spurring are seen at C4-5, C5-6 and C6-7 appears unchanged. The patient also has marked multilevel facet degenerative disease. 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 Prevertebral soft tissues appear normal. Lung apices are clear.  IMPRESSION: No acute finding.  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   Electronically Signed   By: Inge Rise M.D.   On: 01/08/2021 14:21 I, Lynne Leader, personally (independently) visualized and performed the interpretation of the images attached in this note.   Nerve conduction study dated December 30, 2020 Patient Complaints: This is a 45 year old female referred for evaluation of right arm numbness and tingling.  NCV & EMG Findings: Extensive electrodiagnostic testing of the right upper extremity shows:  1. Right median, ulnar, and mixed palmar sensory responses are within normal limits. 2. Right median and ulnar motor responses are within normal limits. 3. Chronic motor axonal loss changes are seen affecting the right C8 myotomes, without accompanied active denervation.  Impression: 1. Chronic C8 radiculopathy affecting the right upper extremity, mild. 2. There is no evidence of a right carpal tunnel syndrome or ulnar neuropathy.   ___________________________ Narda Amber, DO  Assessment and Plan: 45 y.o. female with right hand stiffness weakness and loss of function.  This is thought to be due to C8 radiculopathy.  She does describe some mechanical symptoms into her hand that may be related to trigger finger.  However I could not appreciate that with physical exam today.  Plan to further work-up of cervical radiculopathy seen on nerve conduction study with MRI of C-spine.  Next step following MRI is probably epidural steroid injection.  If hand symptoms persist and are thought to be unrelated to cervical radiculopathy she may benefit from referral to hand surgery.   PDMP not  reviewed this encounter. Orders Placed This Encounter  Procedures  . MR CERVICAL SPINE WO CONTRAST    Standing Status:   Future    Standing Expiration Date:   01/19/2022    Order Specific Question:   What is the patient's sedation requirement?    Answer:   No Sedation    Order Specific Question:   Does the patient have a pacemaker or  implanted devices?    Answer:   No    Order Specific Question:   Preferred imaging location?    Answer:   Product/process development scientist (table limit-350lbs)   No orders of the defined types were placed in this encounter.    Discussed warning signs or symptoms. Please see discharge instructions. Patient expresses understanding.   The above documentation has been reviewed and is accurate and complete Lynne Leader, M.D.

## 2021-01-19 NOTE — Patient Instructions (Signed)
Thank you for coming in today.  Plan for MRI.   Try double bandaid splint.   Recheck following MRI.   Let me know if you need a different work note or form.    Trigger Finger  Trigger finger, also called stenosing tenosynovitis,  is a condition that causes a finger to get stuck in a bent position. Each finger has a tendon, which is a tough, cord-like tissue that connects muscle to bone, and each tendon passes through a tunnel of tissue called a tendon sheath. To move your finger, your tendon needs to glide freely through the sheath. Trigger finger happens when the tendon or the sheath thickens, making it difficult to move your finger. Trigger finger can affect any finger or a thumb. It may affect more than one finger. Mild cases may clear up with rest and medicine. Severe cases require more treatment. What are the causes? Trigger finger is caused by a thickened finger tendon or tendon sheath. The cause of this thickening is not known. What increases the risk? The following factors may make you more likely to develop this condition:  Doing activities that require a strong grip.  Having rheumatoid arthritis, gout, or diabetes.  Being 34-24 years old.  Being female. What are the signs or symptoms? Symptoms of this condition include:  Pain when bending or straightening your finger.  Tenderness or swelling where your finger attaches to the palm of your hand.  A lump in the palm of your hand or on the inside of your finger.  Hearing a noise like a pop or a snap when you try to straighten your finger.  Feeling a catching or locking sensation when you try to straighten your finger.  Being unable to straighten your finger. How is this diagnosed? This condition is diagnosed based on your symptoms and a physical exam. How is this treated? This condition may be treated by:  Resting your finger and avoiding activities that make symptoms worse.  Wearing a finger splint to keep your  finger extended.  Taking NSAIDs, such as ibuprofen, to relieve pain and swelling.  Doing gentle exercises to stretch the finger as told by your health care provider.  Having medicine that reduces swelling and inflammation (steroids) injected into the tendon sheath. Injections may need to be repeated.  Having surgery to open the tendon sheath. This may be done if other treatments do not work and you cannot straighten your finger. You may need physical therapy after surgery. Follow these instructions at home: If you have a splint:  Wear the splint as told by your health care provider. Remove it only as told by your health care provider.  Loosen it if your fingers tingle, become numb, or turn cold and blue.  Keep it clean.  If the splint is not waterproof: ? Do not let it get wet. ? Cover it with a watertight covering when you take a bath or shower. Managing pain, stiffness, and swelling If directed, apply heat to the affected area as often as told by your health care provider. Use the heat source that your health care provider recommends, such as a moist heat pack or a heating pad.  Place a towel between your skin and the heat source.  Leave the heat on for 20-30 minutes.  Remove the heat if your skin turns bright red. This is especially important if you are unable to feel pain, heat, or cold. You may have a greater risk of getting burned. If directed, put  ice on the painful area. To do this:  If you have a removable splint, remove it as told by your health care provider.  Put ice in a plastic bag.  Place a towel between your skin and the bag or between your splint and the bag.  Leave the ice on for 20 minutes, 2-3 times a day.      Activity  Rest your finger as told by your health care provider. Avoid activities that make the pain worse.  Return to your normal activities as told by your health care provider. Ask your health care provider what activities are safe for  you.  Do exercises as told by your health care provider.  Ask your health care provider when it is safe to drive if you have a splint on your hand. General instructions  Take over-the-counter and prescription medicines only as told by your health care provider.  Keep all follow-up visits as told by your health care provider. This is important. Contact a health care provider if:  Your symptoms are not improving with home care. Summary  Trigger finger, also called stenosing tenosynovitis, causes your finger to get stuck in a bent position. This can make it difficult and painful to straighten your finger.  This condition develops when a finger tendon or tendon sheath thickens.  Treatment may include resting your finger, wearing a splint, and taking medicines.  In severe cases, surgery to open the tendon sheath may be needed. This information is not intended to replace advice given to you by your health care provider. Make sure you discuss any questions you have with your health care provider. Document Revised: 02/26/2019 Document Reviewed: 02/26/2019 Elsevier Patient Education  [name]

## 2021-01-22 ENCOUNTER — Encounter: Payer: Self-pay | Admitting: Family Medicine

## 2021-01-24 ENCOUNTER — Other Ambulatory Visit: Payer: Self-pay | Admitting: Family Medicine

## 2021-01-26 ENCOUNTER — Other Ambulatory Visit: Payer: Self-pay

## 2021-01-26 MED ORDER — OMEPRAZOLE 40 MG PO CPDR: DELAYED_RELEASE_CAPSULE | ORAL | 3 refills | Status: DC

## 2021-01-26 NOTE — Telephone Encounter (Signed)
Please advise 

## 2021-01-27 NOTE — Progress Notes (Signed)
   I, [name], LAT, ATC, am serving as scribe for Dr. Lynne Leader.  Nicole Melendez is a 45 y.o. female who presents to [place] at [facility] today for arm spasms.  She was last seen by Dr. Georgina Snell on 01/19/21 for con't R hand/wrist pain and reported that there R hand and fingers were "locked" for several days.  Her R UE NCV/EMG test was reviewed that indicated likely C8 radiculopathy so a c-spine MRI was ordered.   Since her last visit, pt reports she is having bad spasms within the forearm that's waking her up at night. Pt the locking in her fingers has improved.  MRI ordered.  MRI has not been scheduled yet.  Diagnostic testing: C-spine XR- 01/07/21; R wrist and elbow XR- 11/03/20  Pertinent review of systems: No fevers or chills  Relevant historical information: Heart failure.  Sarcoidosis.   Exam:  BP 140/88 (BP Location: Right Arm, Patient Position: Sitting, Cuff Size: Large)   Pulse 81   Ht 5\' 7"  (1.702 m)   Wt (!) 331 lb 12.8 oz (150.5 kg)   LMP 07/17/2012   SpO2 96%   BMI 51.97 kg/m  General: Well Developed, well nourished, and in no acute distress.   MSK: Right hand visibly twitching at times.  Strength is intact. Pulses intact capillary refill and sensation are intact.     Assessment and Plan: 45 y.o. female with right hand dysfunction pain and twitching thought to be due to C8 radiculopathy.  Patient has had extensive work-up including nerve conduction study that showed concern for C8 radiculopathy.  Additionally cervical spine x-ray showed significant degenerative changes consistent with C8 radiculopathy.  MRI has been ordered and I called and schedule the MRI while the patient was in the room for this Sunday April 10th.  She will follow up with me next week to review MRI and follow-up her hand spasm.  Temporarily will help to control muscle spasm and twitching with baclofen at bedtime.  Continue gabapentin.  As for work remain part-time for at least  2 more weeks she clearly is not doing very well.   PDMP not reviewed this encounter. No orders of the defined types were placed in this encounter.  Meds ordered this encounter  Medications  . baclofen (LIORESAL) 10 MG tablet    Sig: Take 1-2 tablets (10-20 mg total) by mouth at bedtime as needed for muscle spasms.    Dispense:  60 each    Refill:  2     Discussed warning signs or symptoms. Please see discharge instructions. Patient expresses understanding.   The above documentation has been reviewed and is accurate and complete Lynne Leader, M.D.

## 2021-01-28 ENCOUNTER — Other Ambulatory Visit: Payer: Self-pay

## 2021-01-28 ENCOUNTER — Ambulatory Visit (INDEPENDENT_AMBULATORY_CARE_PROVIDER_SITE_OTHER): Payer: BC Managed Care – PPO | Admitting: Family Medicine

## 2021-01-28 VITALS — BP 140/88 | HR 81 | Ht 67.0 in | Wt 331.8 lb

## 2021-01-28 DIAGNOSIS — M5412 Radiculopathy, cervical region: Secondary | ICD-10-CM | POA: Diagnosis not present

## 2021-01-28 DIAGNOSIS — R29898 Other symptoms and signs involving the musculoskeletal system: Secondary | ICD-10-CM

## 2021-01-28 DIAGNOSIS — M47812 Spondylosis without myelopathy or radiculopathy, cervical region: Secondary | ICD-10-CM | POA: Diagnosis not present

## 2021-01-28 MED ORDER — BACLOFEN 10 MG PO TABS: 10.0000 mg | ORAL_TABLET | Freq: Every evening | ORAL | 2 refills | Status: DC | PRN

## 2021-01-28 NOTE — Patient Instructions (Addendum)
Thank you for coming in today.  [facility] called you to schedule your MRI on 01/20/21 so please call them back at [phone] to schedule your MRI.   Your MRI is scheduled on 9 am.  Address: [address] [place], [place] [address] Phone: [phone]  Park in the back parking lot.   Schedule with me next Wednesday or Tuesday to review MRI and follow up the arm twitching.

## 2021-01-29 ENCOUNTER — Encounter: Payer: Self-pay | Admitting: Family Medicine

## 2021-01-30 ENCOUNTER — Other Ambulatory Visit: Payer: Self-pay | Admitting: Internal Medicine

## 2021-02-01 ENCOUNTER — Ambulatory Visit (INDEPENDENT_AMBULATORY_CARE_PROVIDER_SITE_OTHER): Payer: BC Managed Care – PPO

## 2021-02-01 ENCOUNTER — Other Ambulatory Visit: Payer: Self-pay

## 2021-02-01 DIAGNOSIS — M4722 Other spondylosis with radiculopathy, cervical region: Secondary | ICD-10-CM

## 2021-02-01 DIAGNOSIS — M50223 Other cervical disc displacement at C6-C7 level: Secondary | ICD-10-CM

## 2021-02-01 DIAGNOSIS — M5412 Radiculopathy, cervical region: Secondary | ICD-10-CM

## 2021-02-01 DIAGNOSIS — M50122 Cervical disc disorder at C5-C6 level with radiculopathy: Secondary | ICD-10-CM | POA: Diagnosis not present

## 2021-02-01 DIAGNOSIS — M2578 Osteophyte, vertebrae: Secondary | ICD-10-CM | POA: Diagnosis not present

## 2021-02-01 DIAGNOSIS — M50123 Cervical disc disorder at C6-C7 level with radiculopathy: Secondary | ICD-10-CM | POA: Diagnosis not present

## 2021-02-01 DIAGNOSIS — M4802 Spinal stenosis, cervical region: Secondary | ICD-10-CM | POA: Diagnosis not present

## 2021-02-02 ENCOUNTER — Telehealth: Payer: Self-pay | Admitting: Family Medicine

## 2021-02-02 DIAGNOSIS — R29898 Other symptoms and signs involving the musculoskeletal system: Secondary | ICD-10-CM

## 2021-02-02 DIAGNOSIS — M5412 Radiculopathy, cervical region: Secondary | ICD-10-CM

## 2021-02-02 NOTE — Progress Notes (Signed)
MRI does show compression of the cervical nerve root on the right side.  The right C7 nerve root looks worse.  This could cause your symptoms.  I have already ordered an epidural steroid injection. Call [facility] imaging at [phone] to schedule the epidural steroid injection.  You need to be off of all blood thinners before this injection.  I do not think that you take any currently. We will discuss this in further detail at follow-up appoint with me on the 13th.

## 2021-02-02 NOTE — Telephone Encounter (Signed)
Epidural steroid injection ordered 

## 2021-02-03 NOTE — Progress Notes (Signed)
I, [name], LAT, ATC, am serving as scribe for Dr. Lynne Leader.  Nicole Melendez is a 45 y.o. female who presents to [place] at [facility] today for f/u R hand/wrist pain and c-spine MRI review. Pt was last seen by Dr. Georgina Snell on 01/28/21 for arm spasms and R hand/wrist pain and was prescribed baclofen to take at bedtime and advised to cont part-time work status. Today, pt reports that her R arm con't to spasm.  She took the baclofen and her anxiety medication prior to her MRI and notes that she had to re-do one of the images.  She con't to take the Baclofen and she does feel that it helps w/ the R arm spasms.  Pt states that her epidural needs to be placed to Dr Vira Blanco at [facility] so that she does not get kicked out of their practice/pain management.  At the last visit she was complaining of twitching and spasming of her right hand.  She was prescribed baclofen and finds that 20 mg of baclofen during the day is very helpful.  This is not sedating for her.  Dx imaging: 02/01/21 C-spine MRI  01/07/21 C-spine XR  11/03/20 R elbow & R wrist XR  10/30/20 R elbow XR  Pertinent review of systems: No fevers or chills  Relevant historical information: Rheumatologic disease including sarcoidosis.   Exam:  BP 134/90 (BP Location: Right Arm, Patient Position: Sitting, Cuff Size: Large)   Pulse 82   Ht 5\' 7"  (1.702 m)   Wt (!) 331 lb 6.4 oz (150.3 kg)   LMP 07/17/2012   SpO2 97%   BMI 51.90 kg/m  General: Well Developed, well nourished, and in no acute distress.   MSK: Right hand normal-appearing    Lab and Radiology Results No results found. However, due to the size of the patient record, not all encounters were searched. Please check Results Review for a complete set of results. MR CERVICAL SPINE WO CONTRAST  Result Date: 02/01/2021 CLINICAL DATA:  Cervical radiculopathy fall in January EXAM: MRI CERVICAL SPINE WITHOUT CONTRAST TECHNIQUE: Multiplanar,  multisequence MR imaging of the cervical spine was performed. No intravenous contrast was administered. COMPARISON:  None. FINDINGS: The study is slightly limited due to patient motion and technique. Alignment: There is straightening of the normal cervical lordosis. Vertebrae: Endplate reactive changes are seen at C6-C7. The vertebral body heights are well maintained. No fracture, marrow edema,or pathologic marrow infiltration. Cord: Normal signal and morphology. Posterior Fossa, vertebral arteries, paraspinal tissues: The visualized portion of the posterior fossa is unremarkable. Normal flow voids seen within the vertebral arteries. The paraspinal soft tissues are unremarkable. Disc levels: C1-C2: Atlanto-axial junction is normal, without canal narrowing C2-C3: No significant spinal canal or neural foraminal narrowing C3-C4: Uncovertebral osteophytes are present which causes moderate right neural foraminal narrowing. C4-C5: Disc osteophyte complex and uncovertebral osteophytes are present which causes severe bilateral neural foraminal narrowing and mild central canal stenosis. C5-C6: Disc osteophyte complex with a right paracentral disc protrusion and uncovertebral osteophytes are present which causes severe bilateral neural foraminal narrowing and mild central canal stenosis. C6-C7: There is a disc osteophyte complex with a right paracentral disc protrusion which contacts the right C7 nerve root. Severe bilateral neural foraminal narrowing is noted and mild central canal stenosis. C7-T1: No significant canal or neural foraminal narrowing. 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 At C6-C7 there is a right paracentral disc protrusion contacting the  right C7 nerve root with severe bilateral neural foraminal narrowing and mild central canal stenosis. Electronically Signed   By: Prudencio Pair M.D.   On: 02/01/2021 12:52   I, Lynne Leader, personally (independently)  visualized and performed the interpretation of the images attached in this note.     Assessment and Plan: 45 y.o. female with right cervical radiculopathy.  Plan for epidural steroid injection.  Patient already has established relationship with Dr. Vira Blanco  who does perform this procedure.  We will communicate with with him asking to perform an epidural steroid injection.  Provide copy of MRI and nerve conduction study as well as most recent notes.   PDMP not reviewed this encounter. Orders Placed This Encounter  Procedures  . DG INJECT DIAG/THERA/INC NEEDLE/CATH/PLC EPI/LUMB/SAC W/IMG    Standing Status:   Future    Standing Expiration Date:   02/04/2022    Order Specific Question:   Reason for Exam (SYMPTOM  OR DIAGNOSIS REQUIRED)    Answer:   rt c7 or C8 radiculopathy. Level and technique per provider.    Order Specific Question:   Is the patient pregnant?    Answer:   No    Order Specific Question:   Preferred Imaging Location?    Answer:   External    Order Specific Question:   Radiology Contrast Protocol - do NOT remove file path    Answer:   [email or web address]   No orders of the defined types were placed in this encounter.    Discussed warning signs or symptoms. Please see discharge instructions. Patient expresses understanding.   The above documentation has been reviewed and is accurate and complete Lynne Leader, M.D.

## 2021-02-04 ENCOUNTER — Ambulatory Visit (INDEPENDENT_AMBULATORY_CARE_PROVIDER_SITE_OTHER): Payer: BC Managed Care – PPO | Admitting: Family Medicine

## 2021-02-04 ENCOUNTER — Encounter: Payer: Self-pay | Admitting: Family Medicine

## 2021-02-04 ENCOUNTER — Other Ambulatory Visit: Payer: Self-pay

## 2021-02-04 VITALS — BP 134/90 | HR 82 | Ht 67.0 in | Wt 331.4 lb

## 2021-02-04 DIAGNOSIS — M5412 Radiculopathy, cervical region: Secondary | ICD-10-CM | POA: Diagnosis not present

## 2021-02-04 NOTE — Patient Instructions (Addendum)
Thank you for coming in today.  Plan for Epidural steroid injection.   Recheck in 1 month.   Let me know.   Continue baclofen.

## 2021-02-05 ENCOUNTER — Other Ambulatory Visit: Payer: Self-pay

## 2021-02-09 ENCOUNTER — Ambulatory Visit: Payer: BC Managed Care – PPO | Admitting: Family Medicine

## 2021-02-09 ENCOUNTER — Encounter: Payer: BC Managed Care – PPO | Admitting: Internal Medicine

## 2021-02-11 ENCOUNTER — Encounter: Payer: Self-pay | Admitting: Family Medicine

## 2021-02-11 DIAGNOSIS — M4722 Other spondylosis with radiculopathy, cervical region: Secondary | ICD-10-CM | POA: Diagnosis not present

## 2021-02-13 ENCOUNTER — Other Ambulatory Visit: Payer: Self-pay | Admitting: Internal Medicine

## 2021-02-16 DIAGNOSIS — G894 Chronic pain syndrome: Secondary | ICD-10-CM | POA: Diagnosis not present

## 2021-02-16 DIAGNOSIS — M069 Rheumatoid arthritis, unspecified: Secondary | ICD-10-CM | POA: Diagnosis not present

## 2021-02-16 DIAGNOSIS — M47817 Spondylosis without myelopathy or radiculopathy, lumbosacral region: Secondary | ICD-10-CM | POA: Diagnosis not present

## 2021-02-16 DIAGNOSIS — M5136 Other intervertebral disc degeneration, lumbar region: Secondary | ICD-10-CM | POA: Diagnosis not present

## 2021-02-25 ENCOUNTER — Telehealth: Payer: Self-pay | Admitting: Family Medicine

## 2021-02-25 DIAGNOSIS — G894 Chronic pain syndrome: Secondary | ICD-10-CM | POA: Diagnosis not present

## 2021-02-25 DIAGNOSIS — M4722 Other spondylosis with radiculopathy, cervical region: Secondary | ICD-10-CM | POA: Diagnosis not present

## 2021-02-25 DIAGNOSIS — M542 Cervicalgia: Secondary | ICD-10-CM | POA: Diagnosis not present

## 2021-02-25 DIAGNOSIS — M7918 Myalgia, other site: Secondary | ICD-10-CM | POA: Diagnosis not present

## 2021-02-25 NOTE — Telephone Encounter (Signed)
Pt needs employer ([employer]) form completed as her leave was extended until 4/25. Previous form and new one in your hanging file.

## 2021-02-25 NOTE — Telephone Encounter (Signed)
Form completed today. 

## 2021-02-26 NOTE — Telephone Encounter (Signed)
Corrected form with restrictions and RTW date of 02/16/2021 faxed to employer 02/25/2021, pt notified.

## 2021-03-02 ENCOUNTER — Ambulatory Visit: Payer: BC Managed Care – PPO | Admitting: Family Medicine

## 2021-03-02 ENCOUNTER — Encounter: Payer: Self-pay | Admitting: Family Medicine

## 2021-03-02 ENCOUNTER — Other Ambulatory Visit: Payer: Self-pay

## 2021-03-02 VITALS — BP 150/90 | HR 107 | Ht 67.0 in | Wt 333.0 lb

## 2021-03-02 DIAGNOSIS — M5412 Radiculopathy, cervical region: Secondary | ICD-10-CM

## 2021-03-02 NOTE — Progress Notes (Signed)
I, [name], LAT, ATC acting as a scribe for Lynne Leader, MD.  Nicole Melendez is a 45 y.o. female who presents to [place] at [facility] today for f/u R hand/wrist pain due to R-sided cervical radiculitis. Pt was last seen by Dr. Georgina Snell on 02/04/21 and was advised to proceed to epidural steroid injection. Today, pt reports she is doing ok. Still having spasms but they are tolerable.  Cervical epidural steroid injection performed by Dr. Vira Blanco l has helped her pain significantly.  She notes she is having a little bit more hand symptoms today than yesterday after swimming the last few days.  Back to work full time since April 25th. No restrictions.   Dx imaging: 02/01/21 C-spine MRI             01/07/21 C-spine XR             11/03/20 R elbow & R wrist XR             10/30/20 R elbow XR  Pertinent review of systems: No fevers or chills  Relevant historical information: Heart failure   Exam:  BP (!) 150/90 (BP Location: Left Arm, Patient Position: Sitting)   Pulse (!) 107   Ht 5\' 7"  (1.702 m)   Wt (!) 333 lb (151 kg)   LMP 07/17/2012   SpO2 97%   BMI 52.16 kg/m  General: Well Developed, well nourished, and in no acute distress.   MSK: C-spine normal.  Nontender normal cervical motion. Right hand normal-appearing  Normal grip strength.  Nontender. Sensation is intact distally.    Lab and Radiology Results  EXAM: MRI CERVICAL SPINE WITHOUT CONTRAST  TECHNIQUE: Multiplanar, multisequence MR imaging of the cervical spine was performed. No intravenous contrast was administered.  COMPARISON:  None.  FINDINGS: The study is slightly limited due to patient motion and technique.  Alignment: There is straightening of the normal cervical lordosis.  Vertebrae: Endplate reactive changes are seen at C6-C7. The vertebral body heights are well maintained. No fracture, marrow edema,or pathologic marrow infiltration.  Cord: Normal signal and  morphology.  Posterior Fossa, vertebral arteries, paraspinal tissues:  The visualized portion of the posterior fossa is unremarkable. Normal flow voids seen within the vertebral arteries. The paraspinal soft tissues are unremarkable.  Disc levels:  C1-C2: Atlanto-axial junction is normal, without canal narrowing  C2-C3: No significant spinal canal or neural foraminal narrowing  C3-C4: Uncovertebral osteophytes are present which causes moderate right neural foraminal narrowing.  C4-C5: Disc osteophyte complex and uncovertebral osteophytes are present which causes severe bilateral neural foraminal narrowing and mild central canal stenosis.  C5-C6: Disc osteophyte complex with a right paracentral disc protrusion and uncovertebral osteophytes are present which causes severe bilateral neural foraminal narrowing and mild central canal stenosis.  C6-C7: There is a disc osteophyte complex with a right paracentral disc protrusion which contacts the right C7 nerve root. Severe bilateral neural foraminal narrowing is noted and mild central canal stenosis.  C7-T1: No significant canal or neural foraminal narrowing.  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 At C6-C7 there is a right paracentral disc protrusion contacting the right C7 nerve root with severe bilateral neural foraminal narrowing and mild central canal stenosis.   Electronically Signed   By: Prudencio Pair M.D.   On: 02/01/2021 12:52 I, Lynne Leader, personally (independently) visualized and performed the interpretation of the images attached in this note.     Assessment and  Plan: 45 y.o. female with right hand pain due to cervical radiculopathy at C8 or C7.  Patient is doing better today than she has been in the last several months and after 1 epidural steroid injection by her pain management specialist Dr. Vira Blanco.  She had a little bit more symptoms after  swimming.  My Link Snuffer is that with the increased neck extension with a prone she is able to pinch her nerve a little worse than normal.  Did some problem-solving on how to swim a little more easily and the simple solution would backstroke.  However goggles and a lap swimming snorkel would be a good solution as well.  Additionally recommend continued baclofen as needed as that has been helpful for her hand spasm.  Of course that she is eligible for a second or third epidural steroid injection (total of 3 and a 60-month.)  However if she needs more of those she can certainly ask her pain management specialist Dr. Vira Blanco.  She is now back to work full-time.  Recheck back with me as needed.    Discussed warning signs or symptoms. Please see discharge instructions. Patient expresses understanding.   The above documentation has been reviewed and is accurate and complete Lynne Leader, M.D.  Total encounter time 20 minutes including face-to-face time with the patient and, reviewing past medical record, and charting on the date of service.

## 2021-03-02 NOTE — Patient Instructions (Addendum)
Thank you for coming in today.  Dr Vira Blanco can do another injection (total of 3 in 6 months) if needed. Just ask him.   Let me know if we need to change the work notes.   Baclofen can help. Take it as needed .  Recheck with me as needed.

## 2021-03-04 ENCOUNTER — Ambulatory Visit: Payer: BC Managed Care – PPO | Admitting: Family Medicine

## 2021-03-04 ENCOUNTER — Other Ambulatory Visit: Payer: Self-pay

## 2021-03-04 MED ORDER — ONDANSETRON 4 MG PO TBDP: 4.0000 mg | ORAL_TABLET | Freq: Three times a day (TID) | ORAL | 1 refills | Status: DC | PRN

## 2021-03-16 DIAGNOSIS — Z79899 Other long term (current) drug therapy: Secondary | ICD-10-CM | POA: Diagnosis not present

## 2021-03-16 DIAGNOSIS — M5136 Other intervertebral disc degeneration, lumbar region: Secondary | ICD-10-CM | POA: Diagnosis not present

## 2021-03-16 DIAGNOSIS — M47817 Spondylosis without myelopathy or radiculopathy, lumbosacral region: Secondary | ICD-10-CM | POA: Diagnosis not present

## 2021-03-16 DIAGNOSIS — G894 Chronic pain syndrome: Secondary | ICD-10-CM | POA: Diagnosis not present

## 2021-03-16 DIAGNOSIS — Z79891 Long term (current) use of opiate analgesic: Secondary | ICD-10-CM | POA: Diagnosis not present

## 2021-03-16 DIAGNOSIS — M25531 Pain in right wrist: Secondary | ICD-10-CM | POA: Diagnosis not present

## 2021-03-17 ENCOUNTER — Other Ambulatory Visit: Payer: Self-pay | Admitting: Internal Medicine

## 2021-03-21 ENCOUNTER — Other Ambulatory Visit: Payer: Self-pay | Admitting: Family Medicine

## 2021-03-24 NOTE — Telephone Encounter (Signed)
Rx refill approved per Dr. Clovis Riley orders.

## 2021-03-26 ENCOUNTER — Encounter: Payer: Self-pay | Admitting: Internal Medicine

## 2021-03-26 ENCOUNTER — Ambulatory Visit (INDEPENDENT_AMBULATORY_CARE_PROVIDER_SITE_OTHER): Payer: BC Managed Care – PPO | Admitting: Emergency Medicine

## 2021-03-26 ENCOUNTER — Other Ambulatory Visit: Payer: Self-pay

## 2021-03-26 ENCOUNTER — Ambulatory Visit (INDEPENDENT_AMBULATORY_CARE_PROVIDER_SITE_OTHER): Payer: BC Managed Care – PPO | Admitting: Internal Medicine

## 2021-03-26 ENCOUNTER — Encounter: Payer: Self-pay | Admitting: Emergency Medicine

## 2021-03-26 VITALS — BP 138/82 | HR 93 | Ht 67.0 in | Wt 334.0 lb

## 2021-03-26 DIAGNOSIS — E559 Vitamin D deficiency, unspecified: Secondary | ICD-10-CM | POA: Diagnosis not present

## 2021-03-26 DIAGNOSIS — J449 Chronic obstructive pulmonary disease, unspecified: Secondary | ICD-10-CM | POA: Diagnosis not present

## 2021-03-26 DIAGNOSIS — D860 Sarcoidosis of lung: Secondary | ICD-10-CM

## 2021-03-26 DIAGNOSIS — E1140 Type 2 diabetes mellitus with diabetic neuropathy, unspecified: Secondary | ICD-10-CM

## 2021-03-26 DIAGNOSIS — I10 Essential (primary) hypertension: Secondary | ICD-10-CM | POA: Diagnosis not present

## 2021-03-26 DIAGNOSIS — R609 Edema, unspecified: Secondary | ICD-10-CM

## 2021-03-26 DIAGNOSIS — D869 Sarcoidosis, unspecified: Secondary | ICD-10-CM | POA: Diagnosis not present

## 2021-03-26 DIAGNOSIS — M7918 Myalgia, other site: Secondary | ICD-10-CM | POA: Diagnosis not present

## 2021-03-26 DIAGNOSIS — Z794 Long term (current) use of insulin: Secondary | ICD-10-CM

## 2021-03-26 DIAGNOSIS — J301 Allergic rhinitis due to pollen: Secondary | ICD-10-CM

## 2021-03-26 DIAGNOSIS — M533 Sacrococcygeal disorders, not elsewhere classified: Secondary | ICD-10-CM | POA: Diagnosis not present

## 2021-03-26 LAB — HEMOGLOBIN A1C: Hgb A1c MFr Bld: 6.9 % — ABNORMAL HIGH (ref 4.6–6.5)

## 2021-03-26 MED ORDER — TELMISARTAN-HCTZ 40-12.5 MG PO TABS: 1.0000 | ORAL_TABLET | Freq: Every day | ORAL | 3 refills | Status: DC

## 2021-03-26 MED ORDER — GABAPENTIN 300 MG PO CAPS: ORAL_CAPSULE | ORAL | 5 refills | Status: DC

## 2021-03-26 MED ORDER — LEVALBUTEROL TARTRATE 45 MCG/ACT IN AERO: 1.0000 | INHALATION_SPRAY | Freq: Three times a day (TID) | RESPIRATORY_TRACT | 0 refills | Status: DC | PRN

## 2021-03-26 MED ORDER — MOMETASONE FURO-FORMOTEROL FUM 200-5 MCG/ACT IN AERO: 2.0000 | INHALATION_SPRAY | Freq: Two times a day (BID) | RESPIRATORY_TRACT | 5 refills | Status: DC

## 2021-03-26 MED ORDER — AZELASTINE HCL 0.1 % NA SOLN: 2.0000 | Freq: Two times a day (BID) | NASAL | 12 refills | Status: DC

## 2021-03-26 MED ORDER — AZELASTINE-FLUTICASONE 137-50 MCG/ACT NA SUSP: 1.0000 | Freq: Two times a day (BID) | NASAL | 5 refills | Status: DC

## 2021-03-26 MED ORDER — THERA-D 2000 50 MCG (2000 UT) PO TABS: ORAL_TABLET | ORAL | 99 refills | Status: DC

## 2021-03-26 NOTE — Assessment & Plan Note (Signed)
Mild, to add dymista asd,  to f/u any worsening symptoms or concerns

## 2021-03-26 NOTE — Assessment & Plan Note (Signed)
Continue cetirizine and add chlorpheniramine in the evenings.  She was benefiting from Astelin nasal spray but ran out of this.  She does not want to restart the fluticasone nasal spray, only the Astelin.

## 2021-03-26 NOTE — Assessment & Plan Note (Signed)
Lab Results  Component Value Date   HGBA1C 6.9 (H) 10/15/2020   Stable, pt to continue current medical treatment  - novolog

## 2021-03-26 NOTE — Assessment & Plan Note (Signed)
Slight, for add hct 12.5 qd,  to f/u any worsening symptoms or concerns

## 2021-03-26 NOTE — Progress Notes (Signed)
Patient ID: Nicole Melendez, female   DOB: Feb 05, 1976, 45 y.o.   MRN: [id number]        Chief Complaint: f/u htn, leg sweling, allergies, dm and low vit d       HPI:  Nicole Melendez is a 45 y.o. female here after out of BP meds for 1 months, also now with mild leg swelling, and several BP at home mild elevated as well.  Does have several wks ongoing nasal allergy symptoms with clearish congestion, itch and sneezing, without fever, pain, ST, cough, swelling or wheezing, and current meds dont seem to stay in the nasal area to work well.  Pt denies chest pain, increased sob or doe, wheezing, orthopnea, PND, increased LE swelling, palpitations, dizziness or syncope.   Pt denies polydipsia, polyuria, or new focal neuro s/s.  Not taking Vit D.     Pt notes Dr Vira Blanco pain management increased gabapentin to 900 tid.   Wt Readings from Last 3 Encounters:  03/26/21 (!) 335 lb 12.8 oz (152.3 kg)  03/26/21 (!) 334 lb (151.5 kg)  03/02/21 (!) 333 lb (151 kg)   BP Readings from Last 3 Encounters:  03/26/21 120/82  03/26/21 138/82  03/02/21 (!) 150/90         Past Medical History:  Diagnosis Date  . Allergic rhinitis, cause unspecified   . Anemia   . Anxiety   . Depression   . Diabetes mellitus type II    steroid related, patient states "im no longer diabetic"  . Essential hypertension 08/08/2015  . GERD (gastroesophageal reflux disease)   . History of blood transfusion   . Hyperlipidemia   . Menorrhagia   . Migraine   . Morbid obesity ([place])   . Otitis media 06/28/2015  . Peptic ulcer disease   . Positive ANA (antinuclear antibody) 02/14/2012  . Sarcoid    including hand per rheumatology-Dr. Amil Amen  . Sarcoidosis of lung ([place])   . Shortness of breath    on exertion  . Varicose veins with pain    Past Surgical History:  Procedure Laterality Date  . ABDOMINAL HYSTERECTOMY    . COLONOSCOPY WITH PROPOFOL N/A 10/19/2016   Procedure: COLONOSCOPY WITH PROPOFOL;  Surgeon: Mauri Pole, MD;   Location: [facility];  Service: Endoscopy;  Laterality: N/A;  . ESOPHAGOGASTRODUODENOSCOPY (EGD) WITH PROPOFOL N/A 10/19/2016   Procedure: ESOPHAGOGASTRODUODENOSCOPY (EGD) WITH PROPOFOL;  Surgeon: Mauri Pole, MD;  Location: [facility];  Service: Endoscopy;  Laterality: N/A;  . HERNIA MESH REMOVAL  02/2013  . uterine ablation  03/2010  . WISDOM TOOTH EXTRACTION      reports that she quit smoking about 8 years ago. Her smoking use included cigarettes. She has a 20.00 pack-year smoking history. She has never used smokeless tobacco. She reports that she does not drink alcohol and does not use drugs. family history includes Allergies in her mother; Bone cancer in her maternal aunt; Breast cancer in her maternal aunt; Diabetes in her father and mother; Heart attack in her mother; Heart disease in her father; Hypertension in an other family member; Lung cancer in her maternal aunt; Ovarian cancer in her maternal aunt; Rheum arthritis in her father; Stroke in her father and maternal uncle. Allergies  Allergen Reactions  . Azithromycin Hives    (z pak) hives  . Bee Venom Anaphylaxis  . Flagyl [Metronidazole] Hives and Shortness Of Breath  . Penicillins Shortness Of Breath and Swelling    Has patient had a PCN reaction  causing immediate rash, facial/tongue/throat swelling, SOB or lightheadedness with hypotension: Yes Has patient had a PCN reaction causing severe rash involving mucus membranes or skin necrosis: No Has patient had a PCN reaction that required hospitalization No Has patient had a PCN reaction occurring within the last 10 years: No If all of the above answers are "NO", then may proceed with Cephalosporin use.  REACTION: swelling and difficulty breathing  . Shellfish Allergy Anaphylaxis  . Klonopin [Clonazepam] Other (See Comments)    Memory difficulty   Current Outpatient Medications on File Prior to Visit  Medication Sig Dispense Refill  . acetaminophen (TYLENOL) 650 MG  CR tablet Take 650 mg by mouth every 8 (eight) hours as needed for pain.    Marland Kitchen albuterol (VENTOLIN HFA) 108 (90 Base) MCG/ACT inhaler INHALE 2 PUFFS INTO THE LUNGS EVERY 4 HOURS AS NEEDED FOR WHEEZING OR SHORTNESS OF BREATH 8.5 g 0  . ALPRAZolam (XANAX) 0.5 MG tablet TAKE 1 TABLET(0.5 MG) BY MOUTH THREE TIMES DAILY AS NEEDED 90 tablet 2  . baclofen (LIORESAL) 10 MG tablet Take 1-2 tablets (10-20 mg total) by mouth at bedtime as needed for muscle spasms. 60 each 2  . BD PEN NEEDLE NANO 2ND GEN 32G X 4 MM MISC USE AS DIRECTED THREE TIMES DAILY 300 each 3  . Blood Glucose Monitoring Suppl (ONETOUCH VERIO) w/Device KIT Use as directed daily E11.9 1 kit 0  . cetirizine (ZYRTEC) 10 MG tablet TAKE 1 TABLET BY MOUTH DAILY 90 tablet 1  . colchicine 0.6 MG tablet Take 1 tablet (0.6 mg total) by mouth daily. 60 tablet 5  . cyclobenzaprine (FLEXERIL) 5 MG tablet TAKE 1 TABLET(5 MG) BY MOUTH THREE TIMES DAILY AS NEEDED FOR MUSCLE SPASMS 40 tablet 2  . furosemide (LASIX) 40 MG tablet 1 tab by mouth every day in the AM and then 1 as needed only for persistent swelling in the PM (Patient not taking: Reported on 03/26/2021) 60 tablet 11  . glucose blood (ONETOUCH VERIO) test strip USE AS DIRECTED THREE TIMES DAILY 100 strip 11  . insulin aspart (NOVOLOG) 100 UNIT/ML FlexPen 12 units four times per day E11.9 15 mL 11  . nystatin (MYCOSTATIN) 100000 UNIT/ML suspension TAKE 5 MLS BY MOUTH 4(FOUR) TIMES DAILY. SWISH FOR 30 SECONDS AND SWALLOW 180 mL 0  . omeprazole (PRILOSEC) 40 MG capsule TAKE 1 CAPSULE(40 MG) BY MOUTH DAILY 90 capsule 3  . ondansetron (ZOFRAN ODT) 4 MG disintegrating tablet Take 1 tablet (4 mg total) by mouth every 8 (eight) hours as needed for nausea or vomiting. 30 tablet 1  . oxyCODONE-acetaminophen (PERCOCET) 10-325 MG tablet Take 1 tablet by mouth every 8 (eight) hours as needed for pain.   0  . rizatriptan (MAXALT-MLT) 10 MG disintegrating tablet Take 1 tablet earliest onset of migraine.  May repeat  in 2 hours if needed.  Maximum 2 tablets in 24 hours 9 tablet 5  . rosuvastatin (CRESTOR) 40 MG tablet TAKE 1 TABLET(40 MG) BY MOUTH DAILY 90 tablet 2  . sertraline (ZOLOFT) 100 MG tablet TAKE 2 TABLETS(200 MG) BY MOUTH DAILY 180 tablet 3  . TURMERIC PO Take 1 tablet by mouth daily.    . vitamin B-12 (CYANOCOBALAMIN) 1000 MCG tablet Take 1,000 mcg by mouth daily.    Marland Kitchen zonisamide (ZONEGRAN) 100 MG capsule Take 2 capsules (200 mg total) by mouth daily. 60 capsule 5   No current facility-administered medications on file prior to visit.        ROS:  All others reviewed and negative.  Objective        PE:  BP 138/82 (BP Location: Left Arm, Patient Position: Sitting, Cuff Size: Large)   Pulse 93   Ht _0  (1.702 m)   Wt (!) 334 lb (151.5 kg)   LMP 07/17/2012   SpO2 98%   BMI 52.31 kg/m                 Constitutional: Pt appears in NAD               HENT: Head: NCAT.                Right Ear: External ear normal.                 Left Ear: External ear normal.                Eyes: . Pupils are equal, round, and reactive to light. Conjunctivae and EOM are normal               Nose: without d/c or deformity; Bilat tm's with mild erythema.  Max sinus areas non tender.  Pharynx with mild erythema, no exudate               Neck: Neck supple. Gross normal ROM               Cardiovascular: Normal rate and regular rhythm.                 Pulmonary/Chest: Effort normal and breath sounds without rales or wheezing.                Abd:  Soft, NT, ND, + BS, no organomegaly               Neurological: Pt is alert. At baseline orientation, motor grossly intact               Skin: Skin is warm. No rashes, no other new lesions, LE edema - trace bilateral leg edema               Psychiatric: Pt behavior is normal without agitation   Micro: none  Cardiac tracings I have personally interpreted today:  none  Pertinent Radiological findings (summarize): none   Lab Results  Component Value Date   WBC  12.6 (H) 10/15/2020   HGB 13.9 10/15/2020   HCT 42.1 10/15/2020   PLT 266.0 10/15/2020   GLUCOSE 113 (H) 10/15/2020   CHOL 155 10/15/2020   TRIG 88.0 10/15/2020   HDL 58.50 10/15/2020   LDLDIRECT 135.0 07/05/2011   LDLCALC 79 10/15/2020   ALT 10 10/15/2020   AST 9 10/15/2020   NA 138 10/15/2020   K 4.1 10/15/2020   CL 104 10/15/2020   CREATININE 0.87 10/15/2020   BUN 11 10/15/2020   CO2 26 10/15/2020   TSH 1.60 10/15/2020   INR 1.3 11/17/2012   HGBA1C 6.9 (H) 10/15/2020   MICROALBUR 0.7 10/15/2020   Assessment/Plan:  MAYMIE BRUNKE is a 45 y.o. Black or African American [2] female with  has a past medical history of Allergic rhinitis, cause unspecified, Anemia, Anxiety, Depression, Diabetes mellitus type II, Essential hypertension (08/08/2015), GERD (gastroesophageal reflux disease), History of blood transfusion, Hyperlipidemia, Menorrhagia, Migraine, Morbid obesity ([place]), Otitis media (06/28/2015), Peptic ulcer disease, Positive ANA (antinuclear antibody) (02/14/2012), Sarcoid, Sarcoidosis of lung ([place]), Shortness of breath, and Varicose veins with pain.  Diabetes mellitus with neuropathy [facility]) Lab Results  Component Value Date   HGBA1C 6.9 (H) 10/15/2020   Stable, pt to continue current medical treatment  - novolog   Vitamin D deficiency Last vitamin D Lab Results  Component Value Date   VD25OH 23.66 (L) 10/15/2020   Low, vit d oral replacement   Allergic rhinitis Mild, to add dymista asd,  to f/u any worsening symptoms or concerns  PERIPHERAL EDEMA Slight, for add hct 12.5 qd,  to f/u any worsening symptoms or concerns   Essential hypertension Mild uncontrolled, to change to micardis HCT 40/12.5 qd  Followup: Return in about 6 months (around 09/25/2021).  Cathlean Cower, MD 03/26/2021 9:38 PM [facility]

## 2021-03-26 NOTE — Addendum Note (Signed)
Addended by: Gavin Potters R on: 03/26/2021 12:26 PM   Modules accepted: Orders

## 2021-03-26 NOTE — Assessment & Plan Note (Signed)
Last vitamin D Lab Results  Component Value Date   VD25OH 23.66 (L) 10/15/2020   Low, vit d oral replacement

## 2021-03-26 NOTE — Patient Instructions (Addendum)
We will arrange for a repeat CT scan of the chest to follow your sarcoidosis. We will perform pulmonary function testing in next office visit to compare with priors. We will refill your Dulera.  Please use 2 puffs twice a day every day.  Rinse and gargle after using. Keep your Xopenex available to use 2 puffs when you need it for shortness of breath, chest tightness, wheezing. Continue cetirizine once daily. Add chlorpheniramine 4 to 8 mg each evening for allergies.  You should be able to get this over-the-counter. We will refill Astelin nasal spray.  Use 2 sprays each nostril 2-3 times daily if needed for nasal drainage and congestion. Follow with Dr Lamonte Sakai next available after your CT scan and with PFT on the same day.

## 2021-03-26 NOTE — Assessment & Plan Note (Signed)
Formally managed on methotrexate.  Unclear whether we will need to restart.  She denies any dyspnea, pulmonary symptoms.  She may be minimizing because she does indicate that she missed her Dulera when she ran out of it, had to increase her Xopenex use.  She needs repeat chest imaging, repeat PFT.  Based on this and her clinical status we will decide whether she needs to be back on methotrexate.

## 2021-03-26 NOTE — Assessment & Plan Note (Signed)
Mild uncontrolled, to change to micardis HCT 40/12.5 qd

## 2021-03-26 NOTE — Patient Instructions (Addendum)
After you finish the high dose Vit D prescription per Dr Georgina Snell, Please take OTC Vitamin D3 at 2000 units per day, indefinitely  Ok to try the Dymista instead of the astellin and the flonase if the insurance will pay  Please continue all other medications as before, and refills have been done if requested.  Please have the pharmacy call with any other refills you may need.  Please continue your efforts at being more active, low cholesterol diet, and weight control  Please keep your appointments with your specialists as you may have planned  Please go to the LAB at the blood drawing area for the tests to be done  You will be contacted by phone if any changes need to be made immediately.  Otherwise, you will receive a letter about your results with an explanation, but please check with MyChart first.  Please remember to sign up for MyChart if you have not done so, as this will be important to you in the future with finding out test results, communicating by private email, and scheduling acute appointments online when needed.  Please make an Appointment to return in 6 months, or sooner if needed

## 2021-03-26 NOTE — Progress Notes (Signed)
Subjective:    Patient ID: Nicole Melendez, female    DOB: 07-04-1976, 46 y.o.   MRN: [id number]  HPI   ROV 11/07/19 --Nicole Melendez is 69, has history of former tobacco and sarcoidosis with obstructive lung disease.  Also with diabetes, GERD, hypertension.  She has been managed on methotrexate but came off after she lost connection with Dr Amil Amen.  Formerly on Symbicort currently on Ophiem, uses Xopenex approximately 3-4x a week. She remains on fluticasone nasal spray, Zyrtec, Astelin nasal spray qhs for chronic rhinitis.   She believes that her breathing is doing fairly well. No real wheeze. She is able to do her daily activities and work.  She is having a lot of throat clearing and daily dry cough. Using tessalon perles and robitussin prn .  She remains on omeprazole without any significant breakthrough GERD.   Underwent pulmonary function testing 05/04/2019 which I reviewed, show normal spirometry without a bronchodilator response, very mild restriction based on slightly decreased TLC, decreased diffusion capacity that corrects to the normal range when adjusted for her alveolar volume.  The FEV1 is stable compared with 4 years ago. Most recent CT chest from 01/04/2019 reviewed, shows persistent patchy groundglass attenuation in a peribronchovascular distribution especially in the mid to upper lung zones, some interstitial thickening both bases with subpleural micro nodularity.  No honeycombing or areas of consolidation.  Very little change compared with 10/06/2015.  ROV 03/26/21 --this is a follow-up visit for 45 year old woman, former smoker with a history of sarcoidosis and associated obstructive lung disease. Reports that she had COVID in 11/21 and 1/22.  She was formally on methotrexate.  I last saw her about a year and a half ago.  She has been maintained on Dulera but ran out 2 months ago, uses Xopenex usually rarely, but for the last 2 months she is using 2-3x a day.  Most recent spirometry  05/04/2019.  Most recent CT chest 01/04/2019.  On allergy regimen > zyrtec, out of flonase and astelin.     Review of Systems  As per HPI     Objective:   Physical Exam  Vitals:   03/26/21 1152  BP: 120/82  Pulse: 100  Temp: (!) 97.4 F (36.3 C)  TempSrc: Temporal  SpO2: 97%  Weight: (!) 335 lb 12.8 oz (152.3 kg)  Height: 5\' 7"  (1.702 m)   Gen: Pleasant, obese woman, in no distress,  normal affect  ENT: No lesions,  mouth clear,  oropharynx clear, no postnasal drip  Neck: No JVD, no stridor  Lungs: No use of accessory muscles, clear without rales or rhonchi, no wheeze on forced expiration  Cardiovascular: RRR, heart sounds normal, no murmur or gallops, no peripheral edema  Musculoskeletal: No deformities, no cyanosis or clubbing  Neuro: alert, non focal  Skin: Warm, no lesions or rashes        Assessment & Plan:  Sarcoidosis of lung ([place]) Formally managed on methotrexate.  Unclear whether we will need to restart.  She denies any dyspnea, pulmonary symptoms.  She may be minimizing because she does indicate that she missed her Dulera when she ran out of it, had to increase her Xopenex use.  She needs repeat chest imaging, repeat PFT.  Based on this and her clinical status we will decide whether she needs to be back on methotrexate.  COPD (chronic obstructive pulmonary disease) ([place]) Restart Dulera.  She has Xopenex which will likely not be needed is often when she is back on  maintenance therapy.  PFTs as above  Allergic rhinitis Continue cetirizine and add chlorpheniramine in the evenings.  She was benefiting from Astelin nasal spray but ran out of this.  She does not want to restart the fluticasone nasal spray, only the Astelin.  Baltazar Apo, MD, PhD 03/26/2021, 12:14 PM [facility] [phone] or if no answer [phone]

## 2021-03-26 NOTE — Assessment & Plan Note (Signed)
Restart Dulera.  She has Xopenex which will likely not be needed is often when she is back on maintenance therapy.  PFTs as above

## 2021-03-27 LAB — LIPID PANEL
Cholesterol: 151 mg/dL (ref 0–200)
HDL: 60.5 mg/dL (ref >39.00)
LDL Cholesterol: 76 mg/dL (ref 0–99)
NonHDL: 90.77
Total CHOL/HDL Ratio: 3
Triglycerides: 75 mg/dL (ref 0.0–149.0)
VLDL: 15 mg/dL (ref 0.0–40.0)

## 2021-03-27 LAB — HEPATIC FUNCTION PANEL
ALT: 11 U/L (ref 0–35)
AST: 13 U/L (ref 0–37)
Albumin: 4.1 g/dL (ref 3.5–5.2)
Alkaline Phosphatase: 152 U/L — ABNORMAL HIGH (ref 39–117)
Bilirubin, Direct: 0 mg/dL (ref 0.0–0.3)
Total Bilirubin: 0.4 mg/dL (ref 0.2–1.2)
Total Protein: 7.4 g/dL (ref 6.0–8.3)

## 2021-03-27 LAB — BASIC METABOLIC PANEL
BUN: 9 mg/dL (ref 6–23)
CO2: 25 mEq/L (ref 19–32)
Calcium: 9.4 mg/dL (ref 8.4–10.5)
Chloride: 107 mEq/L (ref 96–112)
Creatinine, Ser: 0.77 mg/dL (ref 0.40–1.20)
GFR: 91.99 mL/min (ref >60.00)
Glucose, Bld: 101 mg/dL — ABNORMAL HIGH (ref 70–99)
Potassium: 4.2 mEq/L (ref 3.5–5.1)
Sodium: 140 mEq/L (ref 135–145)

## 2021-03-28 ENCOUNTER — Encounter: Payer: Self-pay | Admitting: Internal Medicine

## 2021-03-30 ENCOUNTER — Telehealth: Payer: Self-pay

## 2021-03-30 NOTE — Telephone Encounter (Signed)
Handicap parking paper has been completed by Dr. Jenny Reichmann. Patient needs to fill out their portion and it is available for pick up.

## 2021-04-01 ENCOUNTER — Other Ambulatory Visit: Payer: Self-pay

## 2021-04-01 MED ORDER — COLCHICINE 0.6 MG PO TABS: 0.6000 mg | ORAL_TABLET | Freq: Every day | ORAL | 5 refills | Status: DC

## 2021-04-01 MED ORDER — AZELASTINE HCL 0.1 % NA SOLN: 2.0000 | Freq: Two times a day (BID) | NASAL | 12 refills | Status: DC

## 2021-04-07 ENCOUNTER — Other Ambulatory Visit: Payer: Self-pay

## 2021-04-07 ENCOUNTER — Ambulatory Visit (INDEPENDENT_AMBULATORY_CARE_PROVIDER_SITE_OTHER)
Admission: RE | Admit: 2021-04-07 | Discharge: 2021-04-07 | Disposition: A | Discharge status: Home / Self Care | Payer: BC Managed Care – PPO | Source: Ambulatory Visit | Attending: Emergency Medicine | Admitting: Emergency Medicine

## 2021-04-07 ENCOUNTER — Other Ambulatory Visit: Payer: Self-pay | Admitting: Internal Medicine

## 2021-04-07 DIAGNOSIS — J8489 Other specified interstitial pulmonary diseases: Secondary | ICD-10-CM | POA: Diagnosis not present

## 2021-04-07 DIAGNOSIS — D860 Sarcoidosis of lung: Secondary | ICD-10-CM | POA: Diagnosis not present

## 2021-04-07 DIAGNOSIS — D869 Sarcoidosis, unspecified: Secondary | ICD-10-CM | POA: Diagnosis not present

## 2021-04-07 MED ORDER — ZONISAMIDE 100 MG PO CAPS: 200.0000 mg | ORAL_CAPSULE | Freq: Every day | ORAL | 5 refills | Status: DC

## 2021-04-08 ENCOUNTER — Ambulatory Visit: Payer: Self-pay

## 2021-04-08 ENCOUNTER — Encounter: Payer: Self-pay | Admitting: Family Medicine

## 2021-04-08 ENCOUNTER — Ambulatory Visit (INDEPENDENT_AMBULATORY_CARE_PROVIDER_SITE_OTHER): Payer: BC Managed Care – PPO

## 2021-04-08 ENCOUNTER — Ambulatory Visit (INDEPENDENT_AMBULATORY_CARE_PROVIDER_SITE_OTHER): Payer: BC Managed Care – PPO | Admitting: Family Medicine

## 2021-04-08 VITALS — BP 110/70 | HR 104 | Ht 67.0 in | Wt 331.2 lb

## 2021-04-08 DIAGNOSIS — M25561 Pain in right knee: Secondary | ICD-10-CM

## 2021-04-08 DIAGNOSIS — M5417 Radiculopathy, lumbosacral region: Secondary | ICD-10-CM

## 2021-04-08 NOTE — Patient Instructions (Addendum)
Thank you for coming in today.   Please use Voltaren gel (Generic Diclofenac Gel) up to 4x daily for pain as needed.  This is available over-the-counter as both the name brand Voltaren gel and the generic diclofenac gel.   Ask Dr Vira Blanco about the injection.   Do the exercises now. If not better physical therapy could help.   Please perform the exercise program that we have prepared for you and gone over in detail on a daily basis.  In addition to the handout you were provided you can access your program through: [email or web address]   Your unique program code is:  [email or web address]  Follow-up as needed.

## 2021-04-08 NOTE — Progress Notes (Signed)
I, Nicole Melendez, LAT, ATC, am serving as scribe for Dr. Lynne Leader.  Nicole Melendez is a 45 y.o. female who presents to [place] at [facility] today for back and knee pain.  She was last seen by Dr. Georgina Snell on 03/02/21 for f/u of cervical radiculitis and R hand/wrist pain after having had an ESI w/ Dr. Vira Blanco.  Since then, pt reports LBP and R LE sciatica x 2 weeks, radiating to her R knee.  She denies any R LE numbness/tingling.  Aggravating factors include sitting and standing upright.  She has been using TENs and taking her normal prescription medications.  She also fell this past Saturday and landed on her R knee.  Knee pain:  She fell on Saturday (04/04/21) and landed on her R ant knee. -Swelling: No -Mechanical symptoms: yes -Aggravating factors: weight-bearing activity; walking; R knee extension; transitioning from sitting to standing -Treatments tried: ice; Percocet; Flexeril; Baclofen; TENs  Diagnostic imaging: L-spine XR- 10/15/20; L-spine MRI-06/09/19  Pertinent review of systems: No fevers or chills  Relevant historical information: Recent cervical radiculopathy   Exam:  BP 110/70 (BP Location: Left Arm, Patient Position: Sitting, Cuff Size: Large)   Pulse (!) 104   Ht 5\' 7"  (1.702 m)   Wt (!) 331 lb 3.2 oz (150.2 kg)   LMP 07/17/2012   SpO2 97%   BMI 51.87 kg/m  General: Well Developed, well nourished, and in no acute distress.   MSK: L-spine nontender midline. Decreased lumbar motion. Lower extremity strength is intact.  Right knee normal. Normal motion. Tender palpation anterior knee. Intact strength.    Lab and Radiology Results  Diagnostic Limited MSK Ultrasound of: Right knee Intact quad tendon and patella tendon.  No large hypoechoic structures consistent with prepatellar bursitis present on. No fractures visible. Degenerative changes medial and lateral joint line. Impression: No severe prepatellar bursitis   X-ray images right  knee obtained today personally and independently interpreted Mild DJD.  No acute fractures. Await formal radiology review   EXAM: MRI LUMBAR SPINE WITHOUT CONTRAST   TECHNIQUE: Multiplanar, multisequence MR imaging of the lumbar spine was performed. No intravenous contrast was administered.   COMPARISON:  MRI lumbar spine dated May 17, 2018.   FINDINGS: Segmentation:  Unchanged partial lumbarization of S1.   Alignment:  Unchanged 4 mm anterolisthesis at L5-S1.   Vertebrae:  No fracture, evidence of discitis, or bone lesion.   Conus medullaris and cauda equina: Conus extends to the L1-L2 level. Conus and cauda equina appear normal.   Paraspinal and other soft tissues: Negative.   Disc levels:   T12-L1:  Negative.   L1-L2:  Negative.   L2-L3:  Negative.   L3-L4: Negative disc. Moderate bilateral facet arthropathy. No stenosis.   L4-L5: Unchanged disc desiccation and minimal disc bulging. Unchanged severe bilateral facet arthropathy. Unchanged mild bilateral neuroforaminal stenosis. No spinal canal stenosis.   L5-S1: Unchanged disc uncovering and mild disc bulging. Unchanged severe bilateral facet arthropathy. Unchanged moderate bilateral neuroforaminal stenosis. No spinal canal stenosis.   IMPRESSION: 1. Unchanged lower lumbar degenerative disc disease and advanced facet arthropathy. Unchanged moderate bilateral neuroforaminal stenosis at L5-S1.     Electronically Signed   By: Titus Dubin M.D.   On: 06/09/2019 12:06   I, Lynne Leader, personally (independently) visualized and performed the interpretation of the images attached in this note.   Assessment and Plan: 45 y.o. female with right knee pain after fall.  Contusion anterior knee.  Watchful waiting and  exercise program discussed today.  Work on Forensic scientist.  Also recommend Voltaren gel.  Worsening lumbar radiculopathy.  [name] had a recurrence of lumbar radiculopathy after the fall.  This  issue is managed by Dr. Vira Blanco.  She has had intermittent epidural steroid injections and is currently scheduled to have a medial branch block and for facet mediated back pain tomorrow.  I sent a letter to Dr. Vira Blanco today to see if he could perform an epidural steroid injection tomorrow as this may be beneficial.  If not improved may consider repeat lumbar MRI.   PDMP not reviewed this encounter. Orders Placed This Encounter  Procedures   [place] LIMITED JOINT SPACE STRUCTURES LOW RIGHT(NO LINKED CHARGES)    Order Specific Question:   Reason for Exam (SYMPTOM  OR DIAGNOSIS REQUIRED)    Answer:   R knee pain    Order Specific Question:   Preferred imaging location?    Answer:   [place]   DG Knee AP/LAT W/[place] Right    Standing Status:   Future    Number of Occurrences:   1    Standing Expiration Date:   04/08/2022    Order Specific Question:   Reason for Exam (SYMPTOM  OR DIAGNOSIS REQUIRED)    Answer:   eval knee pain    Order Specific Question:   Is patient pregnant?    Answer:   No    Order Specific Question:   Preferred imaging location?    Answer:   [facility]   No orders of the defined types were placed in this encounter.    Discussed warning signs or symptoms. Please see discharge instructions. Patient expresses understanding.   The above documentation has been reviewed and is accurate and complete Lynne Leader, M.D.

## 2021-04-09 NOTE — Progress Notes (Signed)
X-ray right knee shows medium arthritis worse underneath the kneecap

## 2021-04-23 ENCOUNTER — Other Ambulatory Visit: Payer: Self-pay

## 2021-04-23 ENCOUNTER — Encounter (HOSPITAL_COMMUNITY): Payer: Self-pay

## 2021-04-23 ENCOUNTER — Emergency Department (HOSPITAL_COMMUNITY): Payer: Self-pay

## 2021-04-23 ENCOUNTER — Emergency Department (HOSPITAL_COMMUNITY)
Admission: EM | Admit: 2021-04-23 | Discharge: 2021-04-23 | Disposition: A | Discharge status: Home / Self Care | Payer: Self-pay | Attending: Emergency Medicine | Admitting: Emergency Medicine

## 2021-04-23 DIAGNOSIS — S91201A Unspecified open wound of right great toe with damage to nail, initial encounter: Secondary | ICD-10-CM | POA: Insufficient documentation

## 2021-04-23 DIAGNOSIS — Y9289 Other specified places as the place of occurrence of the external cause: Secondary | ICD-10-CM | POA: Insufficient documentation

## 2021-04-23 DIAGNOSIS — Z79899 Other long term (current) drug therapy: Secondary | ICD-10-CM | POA: Insufficient documentation

## 2021-04-23 DIAGNOSIS — I5033 Acute on chronic diastolic (congestive) heart failure: Secondary | ICD-10-CM | POA: Insufficient documentation

## 2021-04-23 DIAGNOSIS — Z794 Long term (current) use of insulin: Secondary | ICD-10-CM | POA: Insufficient documentation

## 2021-04-23 DIAGNOSIS — Z87891 Personal history of nicotine dependence: Secondary | ICD-10-CM | POA: Insufficient documentation

## 2021-04-23 DIAGNOSIS — S91209A Unspecified open wound of unspecified toe(s) with damage to nail, initial encounter: Secondary | ICD-10-CM

## 2021-04-23 DIAGNOSIS — J449 Chronic obstructive pulmonary disease, unspecified: Secondary | ICD-10-CM | POA: Insufficient documentation

## 2021-04-23 DIAGNOSIS — Z8616 Personal history of COVID-19: Secondary | ICD-10-CM | POA: Insufficient documentation

## 2021-04-23 DIAGNOSIS — W2209XA Striking against other stationary object, initial encounter: Secondary | ICD-10-CM | POA: Insufficient documentation

## 2021-04-23 DIAGNOSIS — Z7951 Long term (current) use of inhaled steroids: Secondary | ICD-10-CM | POA: Insufficient documentation

## 2021-04-23 DIAGNOSIS — I110 Hypertensive heart disease with heart failure: Secondary | ICD-10-CM | POA: Insufficient documentation

## 2021-04-23 DIAGNOSIS — E119 Type 2 diabetes mellitus without complications: Secondary | ICD-10-CM | POA: Insufficient documentation

## 2021-04-23 MED ORDER — FLUCONAZOLE 150 MG PO TABS: 150.0000 mg | ORAL_TABLET | Freq: Once | ORAL | 0 refills | Status: DC | PRN

## 2021-04-23 MED ORDER — LIDOCAINE HCL (PF) 1 % IJ SOLN
10.0000 mL | Freq: Once | INTRAMUSCULAR | Status: AC
  Administered 2021-04-23: 10 mL
  Filled 2021-04-23: qty 30

## 2021-04-23 MED ORDER — BACITRACIN ZINC 500 UNIT/GM EX OINT
TOPICAL_OINTMENT | Freq: Once | CUTANEOUS | Status: AC
  Administered 2021-04-23: 1 via TOPICAL
  Filled 2021-04-23: qty 0.9

## 2021-04-23 MED ORDER — DOXYCYCLINE HYCLATE 100 MG PO CAPS: 100.0000 mg | ORAL_CAPSULE | Freq: Two times a day (BID) | ORAL | 0 refills | Status: DC

## 2021-04-23 NOTE — Discharge Instructions (Addendum)
You were seen in the emergency department for evaluation of right great toe pain and signs of infection.  Your x-ray did not show any fracture.  We removed some of the nail.  You will need to soak this 3-4 times a day in warm salt water.  Finish your antibiotics.  Follow-up with your primary care doctor or podiatry.  Return if any worsening or concerning symptoms

## 2021-04-23 NOTE — ED Provider Notes (Signed)
[facility] DEPT Provider Note   CSN: [id number] Arrival date & time: 04/23/21  1505     History Chief Complaint  Patient presents with   Toe Injury    Nicole Melendez is a 45 y.o. female.  She is here for evaluation of right great toe pain after she stubbed it on the concrete 4 days ago.  Its been painful but now it started to get red and draining some pus.  Diabetic.  No fevers nausea vomiting.  Has tried nothing for it.  She rates the pain as 10 out of 10.  The history is provided by the patient.  Toe Pain This is a new problem. Episode onset: 4 days. The problem occurs constantly. The problem has not changed since onset.Pertinent negatives include no chest pain, no abdominal pain, no headaches and no shortness of breath. The symptoms are aggravated by walking. Nothing relieves the symptoms. She has tried rest for the symptoms. The treatment provided no relief.      Past Medical History:  Diagnosis Date   Allergic rhinitis, cause unspecified    Anemia    Anxiety    Depression    Diabetes mellitus type II    steroid related, patient states "im no longer diabetic"   Essential hypertension 08/08/2015   GERD (gastroesophageal reflux disease)    History of blood transfusion    Hyperlipidemia    Menorrhagia    Migraine    Morbid obesity ([place])    Otitis media 06/28/2015   Peptic ulcer disease    Positive ANA (antinuclear antibody) 02/14/2012   Sarcoid    including hand per rheumatology-Dr. 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([place])    Shortness of breath    on exertion   Varicose veins with pain     Patient Active Problem List   Diagnosis Date Noted   Vitamin D deficiency 10/21/2020   Right low back pain 10/15/2020   Recurrent falls while walking 10/15/2020   COVID-19 virus infection 09/28/2020   Exposure to COVID-19 virus 07/10/2020   Viral illness 07/10/2020   Sinusitis 05/23/2020   Allergic conjunctivitis 05/23/2020   Yeast vaginitis  05/23/2020   Polyarthralgia 02/20/2020   Tibialis posterior tendon tear, nontraumatic, right 02/08/2020   Neck pain 12/14/2019   COPD (chronic obstructive pulmonary disease) ([place]) 11/07/2019   Suspected COVID-19 virus infection 08/02/2019   Hematochezia 04/05/2019   Snoring 11/17/2018   Eye symptom 08/03/2018   Right lumbar radiculopathy 05/08/2018   Chronic pain 10/11/2017   Sarcoidosis of lung ([facility])    Acute on chronic diastolic CHF (congestive heart failure) ([place]) 09/22/2017   Rheumatoid arthritis ([place]) 09/18/2017   Chronic diastolic CHF (congestive heart failure) ([place]) 09/18/2017   Pancreatitis 06/26/2017   Right ankle sprain 06/26/2017   Possible exposure to STD 06/15/2017   Diarrhea 04/10/2017   Bilateral hand swelling 04/07/2017   Abdominal pain 04/07/2017   Abnormality of gait 01/26/2017   Avulsion fracture of right ankle 12/01/2016   Ulcer of esophagus with bleeding    Gastritis and gastroduodenitis    Blepharitis of right upper eyelid 10/09/2016   Peroneal tendinitis of lower leg, right 10/06/2016   Right ankle pain 09/16/2016   Dizziness 03/11/2016   Mixed headache 03/09/2016   Chronic venous insufficiency 02/24/2016   Gastroenteritis 12/30/2015   Headache 12/30/2015   Epistaxis 12/09/2015   Post concussive syndrome 12/03/2015   Cervical muscle strain 12/03/2015   Right-sided face pain 11/26/2015   Multiple contusions 11/26/2015  Neck pain, bilateral posterior 11/26/2015   Low back pain 11/26/2015   Spinal stenosis of lumbar region 10/28/2015   Chronic sciatica of left side 10/28/2015   DOE (dyspnea on exertion) 09/02/2015   Essential hypertension 08/08/2015   Diaphoresis 08/07/2015   Cough 07/02/2015   Vaginitis and vulvovaginitis 06/29/2015   Otitis media 06/28/2015   Thrush 06/24/2015   Nausea & vomiting 06/10/2015   Wheezing 11/14/2014   Angioedema 03/19/2014   Vaginitis 11/19/2012   Smoker 11/19/2012   Positive ANA (antinuclear antibody)  02/14/2012   Polyarthritis 02/11/2012   Migraine 04/05/2011   Encounter for well adult exam with abnormal findings 02/21/2011   Allergic rhinitis 02/17/2011   URTICARIA 09/29/2010   Depression 09/14/2010   Diabetes mellitus with neuropathy ([place]) 07/31/2010   PERIPHERAL EDEMA 07/31/2010   INSOMNIA-SLEEP DISORDER-UNSPEC 06/19/2010   MENORRHAGIA 05/15/2010   Hyperlipidemia 12/17/2009   Iron deficiency anemia 12/17/2009   Anxiety state 12/17/2009   Morbid obesity ([place]) 07/02/2007   Gastroesophageal reflux disease 07/02/2007   PEPTIC ULCER DISEASE 07/02/2007    Past Surgical History:  Procedure Laterality Date   ABDOMINAL HYSTERECTOMY     COLONOSCOPY WITH PROPOFOL N/A 10/19/2016   Procedure: COLONOSCOPY WITH PROPOFOL;  Surgeon: Mauri Pole, MD;  Location: [facility]COPY;  Service: Endoscopy;  Laterality: N/A;   ESOPHAGOGASTRODUODENOSCOPY (EGD) WITH PROPOFOL N/A 10/19/2016   Procedure: ESOPHAGOGASTRODUODENOSCOPY (EGD) WITH PROPOFOL;  Surgeon: Mauri Pole, MD;  Location: [facility];  Service: Endoscopy;  Laterality: N/A;   HERNIA MESH REMOVAL  02/2013   uterine ablation  03/2010   WISDOM TOOTH EXTRACTION       OB History   No obstetric history on file.     Family History  Problem Relation Age of Onset   Allergies Mother    Heart attack Mother    Diabetes Mother    Diabetes Father    Heart disease Father    Rheum arthritis Father    Stroke Father    Hypertension Other    Ovarian cancer Maternal Aunt    Lung cancer Maternal Aunt    Breast cancer Maternal Aunt    Bone cancer Maternal Aunt    Stroke Maternal Uncle    Other Neg Hx     Social History   Tobacco Use   Smoking status: Former    Packs/day: 1.00    Years: 20.00    Pack years: 20.00    Types: Cigarettes    Quit date: 10/25/2012    Years since quitting: 8.4   Smokeless tobacco: Never   Tobacco comments:    QUIT 04/2010 AND STARTED BACK 2014 X 3 MONTHS. less than 1 ppd.  started at age 63.     Vaping Use   Vaping Use: Never used  Substance Use Topics   Alcohol use: No   Drug use: No    Home Medications Prior to Admission medications   Medication Sig Start Date End Date Taking? Authorizing Provider  acetaminophen (TYLENOL) 650 MG CR tablet Take 650 mg by mouth every 8 (eight) hours as needed for pain.    [provider]  albuterol (VENTOLIN HFA) 108 (90 Base) MCG/ACT inhaler INHALE 2 PUFFS INTO THE LUNGS EVERY 4 HOURS AS NEEDED FOR WHEEZING OR SHORTNESS OF BREATH 03/31/20   Collene Gobble, MD  ALPRAZolam Duanne Moron) 0.5 MG tablet TAKE 1 TABLET(0.5 MG) BY MOUTH THREE TIMES DAILY AS NEEDED 01/30/21   Biagio Borg, MD  azelastine (ASTELIN) 0.1 % nasal spray Place 2 sprays  into both nostrils 2 (two) times daily. Use in each nostril as directed 04/01/21   Biagio Borg, MD  baclofen (LIORESAL) 10 MG tablet Take 1-2 tablets (10-20 mg total) by mouth at bedtime as needed for muscle spasms. 01/28/21   Gregor Hams, MD  BD PEN NEEDLE NANO 2ND GEN 32G X 4 MM MISC USE AS DIRECTED THREE TIMES DAILY 03/17/21   Biagio Borg, MD  Blood Glucose Monitoring Suppl [facility] VERIO) w/Device KIT Use as directed daily E11.9 08/08/19   Biagio Borg, MD  cetirizine (ZYRTEC) 10 MG tablet TAKE 1 TABLET BY MOUTH DAILY 10/22/20   Biagio Borg, MD  colchicine 0.6 MG tablet Take 1 tablet (0.6 mg total) by mouth daily. 04/01/21   Biagio Borg, MD  cyclobenzaprine (FLEXERIL) 5 MG tablet TAKE 1 TABLET(5 MG) BY MOUTH THREE TIMES DAILY AS NEEDED FOR MUSCLE SPASMS 02/13/21   Biagio Borg, MD  furosemide (LASIX) 40 MG tablet 1 tab by mouth every day in the AM and then 1 as needed only for persistent swelling in the PM 06/16/16   Biagio Borg, MD  gabapentin (NEURONTIN) 300 MG capsule 3 tab by mouth three times daily 03/26/21   Biagio Borg, MD  glucose blood [facility] VERIO) test strip USE AS DIRECTED THREE TIMES DAILY 08/24/20   Biagio Borg, MD  insulin aspart (NOVOLOG) 100 UNIT/ML FlexPen 12 units four times per  day E11.9 07/03/20   Biagio Borg, MD  levalbuterol [facility] HFA) 45 MCG/ACT inhaler Inhale 1-2 puffs into the lungs every 8 (eight) hours as needed for wheezing or shortness of breath. USE 2 PUFFS 3 TIMES DAILY X 5 DAYS THEN BACK TO HOME REGIMEN AS NEEDED. 03/26/21   Collene Gobble, MD  mometasone-formoterol (DULERA) 200-5 MCG/ACT AERO Inhale 2 puffs into the lungs in the morning and at bedtime. 03/26/21   Collene Gobble, MD  nystatin (MYCOSTATIN) 100000 UNIT/ML suspension TAKE 5 MLS BY MOUTH 4(FOUR) TIMES DAILY. SWISH FOR 30 SECONDS AND SWALLOW 03/24/21   Gregor Hams, MD  omeprazole (PRILOSEC) 40 MG capsule TAKE 1 CAPSULE(40 MG) BY MOUTH DAILY 01/26/21   Biagio Borg, MD  ondansetron (ZOFRAN ODT) 4 MG disintegrating tablet Take 1 tablet (4 mg total) by mouth every 8 (eight) hours as needed for nausea or vomiting. 03/04/21   Biagio Borg, MD  oxyCODONE-acetaminophen (PERCOCET) 10-325 MG tablet Take 1 tablet by mouth every 8 (eight) hours as needed for pain.  08/18/18   [provider]  rizatriptan (MAXALT-MLT) 10 MG disintegrating tablet Take 1 tablet earliest onset of migraine.  May repeat in 2 hours if needed.  Maximum 2 tablets in 24 hours 12/23/20   Metta Clines R, DO  rosuvastatin (CRESTOR) 40 MG tablet TAKE 1 TABLET(40 MG) BY MOUTH DAILY 12/12/20   Biagio Borg, MD  sertraline (ZOLOFT) 100 MG tablet TAKE 2 TABLETS(200 MG) BY MOUTH DAILY 02/13/21   Biagio Borg, MD  telmisartan-hydrochlorothiazide (MICARDIS HCT) 40-12.5 MG tablet Take 1 tablet by mouth daily. 03/26/21   Biagio Borg, MD  TURMERIC PO Take 1 tablet by mouth daily.    [provider]  vitamin B-12 (CYANOCOBALAMIN) 1000 MCG tablet Take 1,000 mcg by mouth daily.    [provider]  zonisamide (ZONEGRAN) 100 MG capsule Take 2 capsules (200 mg total) by mouth daily. 04/07/21   Biagio Borg, MD    Allergies    Azithromycin, Bee venom, Flagyl [metronidazole],  Penicillins, Shellfish allergy, and Klonopin  [clonazepam]  Review of Systems   Review of Systems  Constitutional:  Negative for fever.  Respiratory:  Negative for shortness of breath.   Cardiovascular:  Negative for chest pain.  Gastrointestinal:  Negative for abdominal pain, nausea and vomiting.  Skin:  Positive for wound.  Neurological:  Negative for headaches.   Physical Exam Updated Vital Signs BP (!) 133/116 (BP Location: Left Arm)   Pulse (!) 101   Temp 98.6 F (37 C) (Oral)   Resp 18   Ht $R'5\' 7"'KC$  (1.702 m)   Wt (!) 148.8 kg   LMP 07/17/2012   SpO2 99%   BMI 51.37 kg/m   Physical Exam Constitutional:      Appearance: Normal appearance. She is well-developed.  HENT:     Head: Normocephalic and atraumatic.  Eyes:     Conjunctiva/sclera: Conjunctivae normal.  Musculoskeletal:        General: Swelling, tenderness and signs of injury present. No deformity.     Cervical back: Neck supple.     Comments: She has tenderness of her right great toe.  There are some overlying erythema.  The nail is cracked and there is some greenish-yellow discharge in the nail fold.  Skin:    General: Skin is warm and dry.  Neurological:     General: No focal deficit present.     Mental Status: She is alert.     GCS: GCS eye subscore is 4. GCS verbal subscore is 5. GCS motor subscore is 6.    ED Results / Procedures / Treatments   Labs (all labs ordered are listed, but only abnormal results are displayed) Labs Reviewed - No data to display  EKG None  Radiology DG Foot Complete Right  Result Date: 04/23/2021 CLINICAL DATA:  Right great toe pain after injury. EXAM: RIGHT FOOT COMPLETE - 3+ VIEW COMPARISON:  None. FINDINGS: There is no evidence of fracture or dislocation. There is no evidence of arthropathy or other focal bone abnormality. Soft tissues are unremarkable. IMPRESSION: Negative. Electronically Signed   By: Marijo Conception M.D.   On: 04/23/2021 16:03    Procedures .Nail Removal  Date/Time: 04/23/2021 4:35  PM Performed by: Hayden Rasmussen, MD Authorized by: Hayden Rasmussen, MD   Consent:    Consent obtained:  Verbal   Consent given by:  Patient   Risks, benefits, and alternatives were discussed: yes     Risks discussed:  Bleeding, infection, pain, permanent nail deformity and incomplete removal   Alternatives discussed:  Delayed treatment, alternative treatment and referral Universal protocol:    Procedure explained and questions answered to patient or proxy's satisfaction: yes     Patient identity confirmed:  Verbally with patient Location:    Foot:  R big toe Pre-procedure details:    Skin preparation:  Povidone-iodine   Preparation: Patient was prepped and draped in the usual sterile fashion   Anesthesia:    Anesthesia method:  Nerve block   Block anesthetic:  Lidocaine 1% w/o epi   Block injection procedure:  Anatomic landmarks identified, introduced needle, negative aspiration for blood and incremental injection   Block outcome:  Anesthesia achieved Ingrown nail:    Wedge excision of skin: yes     Nail matrix removed or ablated:  None Post-procedure details:    Dressing:  Antibiotic ointment and gauze roll   Procedure completion:  Tolerated well, no immediate complications   Medications Ordered in ED Medications - No data  to display  ED Course  I have reviewed the triage vital signs and the nursing notes.  Pertinent labs & imaging results that were available during my care of the patient were reviewed by me and considered in my medical decision making (see chart for details).    MDM Rules/Calculators/A&P                           Final Clinical Impression(s) / ED Diagnoses Final diagnoses:  Avulsion of toenail, initial encounter    Rx / DC Orders ED Discharge Orders          Ordered    doxycycline (VIBRAMYCIN) 100 MG capsule  2 times daily        04/23/21 1639    fluconazole (DIFLUCAN) 150 MG tablet  Once PRN        04/23/21 1639              Hayden Rasmussen, MD 04/24/21 [phone]

## 2021-04-23 NOTE — ED Triage Notes (Signed)
Patient reports that she stumped her big toe on concrete 4 days ago. Patient states she ripped part of her toenail off and has had redness and yellow/green drainage from the right great toe. Patient has a history of diabetes.

## 2021-05-01 ENCOUNTER — Other Ambulatory Visit (HOSPITAL_COMMUNITY): Payer: BC Managed Care – PPO

## 2021-05-05 ENCOUNTER — Ambulatory Visit: Payer: BC Managed Care – PPO | Admitting: Emergency Medicine

## 2021-05-22 ENCOUNTER — Other Ambulatory Visit: Payer: Self-pay | Admitting: Internal Medicine

## 2021-07-31 ENCOUNTER — Other Ambulatory Visit: Payer: Self-pay | Admitting: Internal Medicine

## 2021-08-12 ENCOUNTER — Other Ambulatory Visit: Payer: Self-pay | Admitting: Family Medicine

## 2021-08-12 NOTE — Telephone Encounter (Signed)
Rx refill request approved per Dr. Corey's orders. 

## 2021-08-14 ENCOUNTER — Other Ambulatory Visit: Payer: Self-pay | Admitting: Internal Medicine

## 2021-08-14 NOTE — Telephone Encounter (Signed)
Please refill as per office routine med refill policy (all routine meds to be refilled for 3 mo or monthly (per pt preference) up to one year from last visit, then month to month grace period for 3 mo, then further med refills will have to be denied) ? ?

## 2021-08-18 ENCOUNTER — Other Ambulatory Visit: Payer: Self-pay

## 2021-08-18 MED ORDER — TELMISARTAN-HCTZ 40-12.5 MG PO TABS: 1.0000 | ORAL_TABLET | Freq: Every day | ORAL | 3 refills | Status: DC

## 2021-08-18 NOTE — Progress Notes (Signed)
Medication refilled

## 2021-09-04 ENCOUNTER — Ambulatory Visit: Payer: Self-pay | Admitting: Emergency Medicine

## 2021-09-10 ENCOUNTER — Other Ambulatory Visit (HOSPITAL_COMMUNITY): Payer: Self-pay

## 2021-09-15 ENCOUNTER — Telehealth: Payer: Self-pay | Admitting: Pharmacy Technician

## 2021-09-15 ENCOUNTER — Other Ambulatory Visit (HOSPITAL_COMMUNITY): Payer: Self-pay

## 2021-09-15 NOTE — Telephone Encounter (Signed)
Spoke with the pt and notified of response per pharmacy  She denied any questions  Nothing further needed

## 2021-09-15 NOTE — Telephone Encounter (Signed)
Patient Advocate Encounter  Received notification from [facility] that the request for prior authorization for Azelastine 125mcg has been denied.    Pt should be able to get OTC or with [facility] Patient Advocate Fax:  [phone]

## 2021-09-25 ENCOUNTER — Ambulatory Visit: Payer: BC Managed Care – PPO | Admitting: Internal Medicine

## 2021-10-22 ENCOUNTER — Other Ambulatory Visit: Payer: Self-pay | Admitting: Internal Medicine

## 2021-10-30 ENCOUNTER — Ambulatory Visit (INDEPENDENT_AMBULATORY_CARE_PROVIDER_SITE_OTHER): Payer: Self-pay | Admitting: Emergency Medicine

## 2021-10-30 ENCOUNTER — Other Ambulatory Visit: Payer: Self-pay

## 2021-10-30 ENCOUNTER — Encounter: Payer: Self-pay | Admitting: Emergency Medicine

## 2021-10-30 DIAGNOSIS — D860 Sarcoidosis of lung: Secondary | ICD-10-CM

## 2021-10-30 DIAGNOSIS — J449 Chronic obstructive pulmonary disease, unspecified: Secondary | ICD-10-CM

## 2021-10-30 MED ORDER — SPIRIVA RESPIMAT 1.25 MCG/ACT IN AERS: 2.0000 | INHALATION_SPRAY | Freq: Every day | RESPIRATORY_TRACT | 0 refills | Status: DC

## 2021-10-30 NOTE — Assessment & Plan Note (Signed)
We will stop Dulera since she is been having significant thrush.  I will try substituting Spiriva to see if she gets as much benefit.  If so then we will continue it going forward.  She has albuterol and Xopenex available to use if needed depending on whether she is having side effects.

## 2021-10-30 NOTE — Progress Notes (Signed)
Subjective:    Patient ID: Nicole Melendez, female    DOB: 1976-06-17, 46 y.o.   MRN: [id number]  HPI   ROV 03/26/21 --this is a follow-up visit for 46 year old woman, former smoker with a history of sarcoidosis and associated obstructive lung disease. Reports that she had COVID in 11/21 and 1/22.  She was formally on methotrexate.  I last saw her about a year and a half ago.  She has been maintained on Dulera but ran out 2 months ago, uses Xopenex usually rarely, but for the last 2 months she is using 2-3x a day.  Most recent spirometry 05/04/2019.  Most recent CT chest 01/04/2019.  On allergy regimen > zyrtec, out of flonase and astelin.   ROV 10/30/21 --Nicole Melendez follows up today for her history of obstructive lung disease in the setting of former tobacco and also sarcoidosis.  She has been on methotrexate in the past.  We repeated pulmonary function testing after her last visit in June 2022 as below.  Repeat PFT have not been done. Her breathing is stable. She has gets white tongue patches, consistent w thrush, has some odynophagia. Remains on Dulera. She uses albuterol about 1x day. She has xopenex as well, but only substitutes it when she is having side effects from the albuterol.    CT chest 04/07/2021 reviewed by me shows stable interstitial changes compared with her prior scans most recently 12/2018.  No evidence of progression, groundglass or active inflammation.  She has scattered borderline mediastinal and hilar lymphadenopathy.     Review of Systems  As per HPI     Objective:   Physical Exam  Vitals:   10/30/21 1157  BP: 132/76  Pulse: 81  Temp: 98.6 F (37 C)  TempSrc: Oral  SpO2: 97%  Weight: (!) 330 lb 3.2 oz (149.8 kg)  Height: 5\' 7"  (1.702 m)   Gen: Pleasant, obese woman, in no distress,  normal affect  ENT: No lesions,  mouth clear,  oropharynx clear, no postnasal drip  Neck: No JVD, no stridor  Lungs: No use of accessory muscles, clear without rales or rhonchi,  no wheeze on forced expiration  Cardiovascular: RRR, heart sounds normal, no murmur or gallops, no peripheral edema  Musculoskeletal: No deformities, no cyanosis or clubbing  Neuro: alert, non focal  Skin: Warm, no lesions or rashes        Assessment & Plan:  Sarcoidosis of lung ([place]) Her sarcoidosis is radiographically and clinically stable.  Her most recent CT did not show any new findings.  No indication for steroids or methotrexate at this time.  I will repeat her pulmonary function testing at some point in next year.  She is about to undergo left knee arthroscopy.  Explained to her that she is at moderate increased risk for anesthesia or surgical procedure due to her sarcoid and her obesity.  That said she is clinically stable and there is no absolute contraindication to proceeding as long as the benefits outweigh these risks.  I will forward this note to orthopedics for her.  COPD (chronic obstructive pulmonary disease) ([place]) We will stop Dulera since she is been having significant thrush.  I will try substituting Spiriva to see if she gets as much benefit.  If so then we will continue it going forward.  She has albuterol and Xopenex available to use if needed depending on whether she is having side effects.  Baltazar Apo, MD, PhD 10/30/2021, 12:14 PM [facility] [phone] or if no answer [phone]

## 2021-10-30 NOTE — Assessment & Plan Note (Signed)
Her sarcoidosis is radiographically and clinically stable.  Her most recent CT did not show any new findings.  No indication for steroids or methotrexate at this time.  I will repeat her pulmonary function testing at some point in next year.  She is about to undergo left knee arthroscopy.  Explained to her that she is at moderate increased risk for anesthesia or surgical procedure due to her sarcoid and her obesity.  That said she is clinically stable and there is no absolute contraindication to proceeding as long as the benefits outweigh these risks.  I will forward this note to orthopedics for her.

## 2021-10-30 NOTE — Addendum Note (Signed)
Addended by: Gavin Potters R on: 10/30/2021 12:23 PM   Modules accepted: Orders

## 2021-10-30 NOTE — Patient Instructions (Signed)
Temporarily stop Dulera. We will try Spiriva 2 puffs once daily.  Keep track of whether this helps you.  If so call our office to let us know and we will continue it through your pharmacy. Use either your albuterol or your Xopenex 2 puffs up to every 4 hours if needed for shortness of breath, chest tightness, wheezing. We reviewed your CT scan of the chest and it is stable compared with your priors.  This is good news. We will plan to repeat your pulmonary function testing at some point in the next year.  We can talk about the timing at your next visit. Follow Dr. Lamonte Sakai in 6 months or sooner if you have any problems.

## 2021-11-07 ENCOUNTER — Encounter: Payer: Self-pay | Admitting: Emergency Medicine

## 2021-12-11 ENCOUNTER — Ambulatory Visit: Payer: Medicaid Other | Admitting: Internal Medicine

## 2021-12-15 ENCOUNTER — Ambulatory Visit: Payer: Medicaid Other | Admitting: Emergency Medicine

## 2021-12-23 NOTE — Progress Notes (Signed)
Virtual Visit via Video Note The purpose of this virtual visit is to provide medical care while limiting exposure to the novel coronavirus.    Consent was obtained for video visit:  Yes.   Answered questions that patient had about telehealth interaction:  Yes.   I discussed the limitations, risks, security and privacy concerns of performing an evaluation and management service by telemedicine. I also discussed with the patient that there may be a patient responsible charge related to this service. The patient expressed understanding and agreed to proceed.  Pt location: Home Physician Location: office Name of referring provider:  Biagio Borg, MD I connected with Nicole Melendez at patients initiation/request on 12/24/2021 at  8:30 AM EST by video enabled telemedicine application and verified that I am speaking with the correct person using two identifiers. Pt MRN:  [id number] Pt DOB:  1976-08-26 Video Participants:  Nicole Melendez  Assessment and Plan:   Migraine without aura, without status migrainosus, not intractable - improved    1.  Migraine prevention: zonisamide titrating to 229m daily 3. Migraine rescue:  rizatriptan 4.  Limit use of pain relievers to no more than 2 days out of week to prevent risk of rebound or medication-overuse headache. 5.  Keep headache diary 6.  Follow up in one year   Subjective:  Nicole Melendez a 46year old right-handed female with sarcoidosis, diabetes with neuropathy, GERD, PUD, HTN, and hyperlipidemia who follows up for migraines and lumbar radiculopathy   MIGRAINE: Update: Restarted zonisamide last year.  Migraines improved.  Intensity:  7/10 Duration:  Usually 1 hour with rizatriptan 121m Frequency:  2 a month (sometimes up to 4) Ran out of zonisamide and rizatriptan and has had daily headache since then.   Current NSAIDS: None (has PUD) Current analgesics: Tylenol, Percocet (pain) Current triptans: rizatriptan 1080murrent  ergotamine: None Current anti-emetic: Zofran 4 mg Current muscle relaxants: baclofen 10-31m63mS PRN Current anti-anxiolytic: Alprazolam, hydroxyzine Current sleep aide: None Current Antihypertensive medications: Lotensin Current Antidepressant medications: Sertraline Current Anticonvulsant medications: Zonisamide 200 mg, gabapentin 1200mg68m   Current anti-CGRP: None Current Vitamins/Herbal/Supplements: Folic acid, D3, turmeric, B12 Current Antihistamines/Decongestants: none Other therapy: None Hormone/birth control: None  Following fall in January 2022, had symptoms of neck pain and cervical radiculopathy.  MRI of cervical spine on 02/01/2021 personally reviewed showed cervical spondylosis most prominent from C4 through C7 with right paracentral disc protrusion at C6-7 contacting the right C7 nerve root with severe bilateral neural foraminal narrowing and mild central canal stenosis..   Caffeine: No Alcohol: No Smoker: No Diet: Hydrates, baked chicken, steamed vegetables Exercise: Yes Depression: Yes; Anxiety: Yes.  Both controlled. Other pain: back pain Sleep hygiene: Good   History: Onset:  Mid-30s Location:  Back of head, right sided Quality:  pounding Initial Intensity:  8-9/10 Aura:  no Prodrome:  no Associated symptoms: Nausea, vomiting, osmophobia, right eye blurred vision, sometimes photophobia.  There is no associated unilateral numbness or weakness. Initial Duration:  2 days (within one hour with sumatriptan) Initial Frequency:  Varies.  Some months 1-2 days, some months 10 days Triggers:  Unknown Relieving factors:  no Activity:  Cannot function 1 to 2 days per month   Past NSAIDS:  Ibuprofen, naproxen (discontinued due to stomach ulcers) Past analgesics:  Percocet, tramadol (for back pain) Past abortive triptans:  sumatriptan 100mg 36m muscle relaxants:  tizanidine 4mg, F63meril Past anti-emetic:  Phenergan, Zofran Past antihypertensive medications:  Lasix Past antidepressant medications:  Wellbutrin Past anticonvulsant medications:  topiramate (caused nausea and vomiting) Past vitamins/Herbal/Supplements:  no Past antihistamines/decongestants:  Zyrtec Other past therapies:  meditation  Family history of headache:  mother   RIGHT LUMBAR RADICULOPATHY: Update: Followed by pain management.  Receiving injections in the back.  Right leg is becoming stronger.  Walking over a mile a day.     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  On 03/14/19, she underwent radiofrequency neurolysis of the left lumbar facet nerves at L2-L3, L3-L4, L4-L5 and L5-S1 levels.  She had no complications.  On 04/23/19, she underwent the same procedure on the right side at the same levels. She noted some right leg weakness afterwards with some pain.  It progressively got worse.  When she is on her feet, the right lower back pain increases and her leg gives out but no leg pain.  No numbness in right leg but notes new numbness in the big toe of the left foot.  Due to the weakness, she started having falls.  She also notes that sometimes when wakes up in the morning, she has trouble making it to the bathroom to urinate.  She has had incontinence.  No perineal numbness.  She now resorts to using a cane to ambulate.  She had NCV-EMG of the lower extremities on 05/22/19, which demonstrated diminished amplitude of both tibaial motor nerves and absent F-wave response in the right peroneal motor nerve but otherwise unremarkable with no explanation for symptoms.  MRI of lumbar spine without contrast on 06/09/19 showed stable degenerative disc disease and facet arthropathy with mild bilateral neuroforaminal stenosis at L4-L5 and moderate neuroforaminal stenosis at L5-S1, but no new structural explanation for symptoms.  Since this started, she reports increased migraines.    Past Medical History: Past Medical History:  Diagnosis Date   Allergic  rhinitis, cause unspecified    Anemia    Anxiety    Depression    Diabetes mellitus type II    steroid related, patient states "im no longer diabetic"   Essential hypertension 08/08/2015   GERD (gastroesophageal reflux disease)    History of blood transfusion    Hyperlipidemia    Menorrhagia    Migraine    Morbid obesity ([place])    Otitis media 06/28/2015   Peptic ulcer disease    Positive ANA (antinuclear antibody) 02/14/2012   Sarcoid    including hand per rheumatology-Dr. 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([place])    Shortness of breath    on exertion   Varicose veins with pain     Medications: Outpatient Encounter Medications as of 12/24/2021  Medication Sig   ALPRAZolam (XANAX) 0.5 MG tablet TAKE 1 TABLET(0.5 MG) BY MOUTH THREE TIMES DAILY AS NEEDED   azelastine (ASTELIN) 0.1 % nasal spray Place 2 sprays into both nostrils 2 (two) times daily. Use in each nostril as directed   baclofen (LIORESAL) 10 MG tablet TAKE 1 TO 2 TABLETS(10 TO 20 MG) BY MOUTH AT BEDTIME AS NEEDED FOR MUSCLE SPASMS   BD PEN NEEDLE NANO 2ND GEN 32G X 4 MM MISC USE AS DIRECTED THREE TIMES DAILY   benazepril-hydrochlorthiazide (LOTENSIN HCT) 20-25 MG tablet Take 1 tablet by mouth daily.   Blood Glucose Monitoring Suppl (ONETOUCH VERIO) w/Device KIT Use as directed daily E11.9   gabapentin (NEURONTIN) 300 MG capsule 3 tab by mouth three times daily   glucose blood (ONETOUCH VERIO) test strip USE AS DIRECTED THREE TIMES DAILY   insulin  aspart (NOVOLOG FLEXPEN) 100 UNIT/ML FlexPen ADMINISTER 12 UNITS UNDER THE SKIN FOUR TIMES DAILY   levalbuterol (XOPENEX HFA) 45 MCG/ACT inhaler Inhale 1-2 puffs into the lungs every 8 (eight) hours as needed for wheezing or shortness of breath. USE 2 PUFFS 3 TIMES DAILY X 5 DAYS THEN BACK TO HOME REGIMEN AS NEEDED.   mometasone-formoterol (DULERA) 200-5 MCG/ACT AERO Inhale 2 puffs into the lungs in the morning and at bedtime.   omeprazole (PRILOSEC) 40 MG capsule TAKE 1 CAPSULE(40 MG)  BY MOUTH DAILY   oxyCODONE-acetaminophen (PERCOCET) 10-325 MG tablet Take 1 tablet by mouth every 8 (eight) hours as needed for pain.    rizatriptan (MAXALT-MLT) 10 MG disintegrating tablet Take 1 tablet earliest onset of migraine.  May repeat in 2 hours if needed.  Maximum 2 tablets in 24 hours   rosuvastatin (CRESTOR) 40 MG tablet TAKE 1 TABLET(40 MG) BY MOUTH DAILY   sertraline (ZOLOFT) 100 MG tablet TAKE 2 TABLETS(200 MG) BY MOUTH DAILY   Tiotropium Bromide Monohydrate (SPIRIVA RESPIMAT) 1.25 MCG/ACT AERS Inhale 2 puffs into the lungs daily.   TURMERIC PO Take 1 tablet by mouth daily.   vitamin B-12 (CYANOCOBALAMIN) 1000 MCG tablet Take 1,000 mcg by mouth daily.   zonisamide (ZONEGRAN) 100 MG capsule Take 2 capsules (200 mg total) by mouth daily.   acetaminophen (TYLENOL) 650 MG CR tablet Take 650 mg by mouth every 8 (eight) hours as needed for pain.   albuterol (VENTOLIN HFA) 108 (90 Base) MCG/ACT inhaler INHALE 2 PUFFS INTO THE LUNGS EVERY 4 HOURS AS NEEDED FOR WHEEZING OR SHORTNESS OF BREATH   cetirizine (ZYRTEC) 10 MG tablet TAKE 1 TABLET BY MOUTH DAILY   colchicine 0.6 MG tablet Take 1 tablet (0.6 mg total) by mouth daily.   cyclobenzaprine (FLEXERIL) 5 MG tablet TAKE 1 TABLET(5 MG) BY MOUTH THREE TIMES DAILY AS NEEDED FOR MUSCLE SPASMS   doxycycline (VIBRAMYCIN) 100 MG capsule Take 1 capsule (100 mg total) by mouth 2 (two) times daily.   fluconazole (DIFLUCAN) 150 MG tablet Take 1 tablet (150 mg total) by mouth once as needed for up to 1 dose (yeast infection, can repeat x1 if not improved).   furosemide (LASIX) 40 MG tablet 1 tab by mouth every day in the AM and then 1 as needed only for persistent swelling in the PM   nystatin (MYCOSTATIN) 100000 UNIT/ML suspension TAKE 5 MLS BY MOUTH 4(FOUR) TIMES DAILY. SWISH FOR 30 SECONDS AND SWALLOW (Patient not taking: Reported on 10/30/2021)   ondansetron (ZOFRAN ODT) 4 MG disintegrating tablet Take 1 tablet (4 mg total) by mouth every 8 (eight)  hours as needed for nausea or vomiting.   [DISCONTINUED] telmisartan-hydrochlorothiazide (MICARDIS HCT) 40-12.5 MG tablet Take 1 tablet by mouth daily.   No facility-administered encounter medications on file as of 12/24/2021.    Allergies: Allergies  Allergen Reactions   Azithromycin Hives    (z pak) hives   Bee Venom Anaphylaxis   Flagyl [Metronidazole] Hives and Shortness Of Breath   Penicillins Shortness Of Breath and Swelling    Has patient had a PCN reaction causing immediate rash, facial/tongue/throat swelling, SOB or lightheadedness with hypotension: Yes Has patient had a PCN reaction causing severe rash involving mucus membranes or skin necrosis: No Has patient had a PCN reaction that required hospitalization No Has patient had a PCN reaction occurring within the last 10 years: No If all of the above answers are "NO", then may proceed with Cephalosporin use.  REACTION: swelling and  difficulty breathing   Shellfish Allergy Anaphylaxis   Klonopin [Clonazepam] Other (See Comments)    Memory difficulty    Family History: Family History  Problem Relation Age of Onset   Allergies Mother    Heart attack Mother    Diabetes Mother    Diabetes Father    Heart disease Father    Rheum arthritis Father    Stroke Father    Hypertension Other    Ovarian cancer Maternal Aunt    Lung cancer Maternal Aunt    Breast cancer Maternal Aunt    Bone cancer Maternal Aunt    Stroke Maternal Uncle    Other Neg Hx     Observations/Objective:   No acute distress.  Alert and oriented.  Speech fluent and not dysarthric.  Language intact.  Eyes orthophoric on primary gaze.  Face symmetric.   Follow Up Instructions:    -I discussed the assessment and treatment plan with the patient. The patient was provided an opportunity to ask questions and all were answered. The patient agreed with the plan and demonstrated an understanding of the instructions.   The patient was advised to call back or  seek an in-person evaluation if the symptoms worsen or if the condition fails to improve as anticipated.   Dudley Major, DO

## 2021-12-24 ENCOUNTER — Other Ambulatory Visit: Payer: Self-pay

## 2021-12-24 ENCOUNTER — Telehealth (INDEPENDENT_AMBULATORY_CARE_PROVIDER_SITE_OTHER): Payer: Self-pay | Admitting: Neurology

## 2021-12-24 ENCOUNTER — Encounter: Payer: Self-pay | Admitting: Neurology

## 2021-12-24 VITALS — Ht 67.0 in | Wt 324.0 lb

## 2021-12-24 DIAGNOSIS — G43009 Migraine without aura, not intractable, without status migrainosus: Secondary | ICD-10-CM

## 2021-12-24 MED ORDER — ZONISAMIDE 100 MG PO CAPS: 200.0000 mg | ORAL_CAPSULE | Freq: Every day | ORAL | 11 refills | Status: DC

## 2021-12-24 MED ORDER — RIZATRIPTAN BENZOATE 10 MG PO TBDP: ORAL_TABLET | ORAL | 11 refills | Status: DC

## 2021-12-25 NOTE — Progress Notes (Signed)
Sent message, via epic in basket, requesting orders in epic from surgeon.  

## 2021-12-29 NOTE — H&P (Signed)
Patient's anticipated LOS is less than 2 midnights, meeting these requirements: - Younger than 13 - Lives within 1 hour of care - Has a competent adult at home to recover with post-op recover - NO history of  - Chronic pain requiring opiods  - Diabetes  - Coronary Artery Disease  - Heart failure  - Heart attack  - Stroke  - DVT/VTE  - Cardiac arrhythmia  - Respiratory Failure/COPD  - Renal failure  - Anemia  - Advanced Liver disease     MAKENZY KRIST is an 46 y.o. female.    Chief Complaint: left knee pain  HPI: Pt is a 46 y.o. female complaining of left knee pain for multiple weeks. Pain had continually increased since the beginning. X-rays in the clinic show meniscal tear left knee. Pt has tried various conservative treatments which have failed to alleviate their symptoms, including injections and therapy. Various options are discussed with the patient. Risks, benefits and expectations were discussed with the patient. Patient understand the risks, benefits and expectations and wishes to proceed with surgery.   PCP:  Biagio Borg, MD  D/C Plans: Home  PMH: Past Medical History:  Diagnosis Date   Allergic rhinitis, cause unspecified    Anemia    Anxiety    Depression    Diabetes mellitus type II    steroid related, patient states "im no longer diabetic"   Essential hypertension 08/08/2015   GERD (gastroesophageal reflux disease)    History of blood transfusion    Hyperlipidemia    Menorrhagia    Migraine    Morbid obesity ([place])    Otitis media 06/28/2015   Peptic ulcer disease    Positive ANA (antinuclear antibody) 02/14/2012   Sarcoid    including hand per rheumatology-Dr. 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([place])    Shortness of breath    on exertion   Varicose veins with pain     PSH: Past Surgical History:  Procedure Laterality Date   ABDOMINAL HYSTERECTOMY     COLONOSCOPY WITH PROPOFOL N/A 10/19/2016   Procedure: COLONOSCOPY WITH PROPOFOL;  Surgeon:  Mauri Pole, MD;  Location: [facility];  Service: Endoscopy;  Laterality: N/A;   ESOPHAGOGASTRODUODENOSCOPY (EGD) WITH PROPOFOL N/A 10/19/2016   Procedure: ESOPHAGOGASTRODUODENOSCOPY (EGD) WITH PROPOFOL;  Surgeon: Mauri Pole, MD;  Location: [facility];  Service: Endoscopy;  Laterality: N/A;   HERNIA MESH REMOVAL  02/2013   uterine ablation  03/2010   WISDOM TOOTH EXTRACTION      Social History:  reports that she quit smoking about 9 years ago. Her smoking use included cigarettes. She has a 20.00 pack-year smoking history. She has never used smokeless tobacco. She reports that she does not drink alcohol and does not use drugs.  Allergies:  Allergies  Allergen Reactions   Azithromycin Hives    (z pak) hives   Bee Venom Anaphylaxis   Flagyl [Metronidazole] Hives and Shortness Of Breath   Penicillins Shortness Of Breath and Swelling    Has patient had a PCN reaction causing immediate rash, facial/tongue/throat swelling, SOB or lightheadedness with hypotension: Yes Has patient had a PCN reaction causing severe rash involving mucus membranes or skin necrosis: No Has patient had a PCN reaction that required hospitalization No Has patient had a PCN reaction occurring within the last 10 years: No If all of the above answers are "NO", then may proceed with Cephalosporin use.  REACTION: swelling and difficulty breathing   Shellfish Allergy Anaphylaxis   Klonopin [  Clonazepam] Other (See Comments)    Memory difficulty    Medications: No current facility-administered medications for this encounter.   Current Outpatient Medications  Medication Sig Dispense Refill   acetaminophen (TYLENOL) 650 MG CR tablet Take 650 mg by mouth every 8 (eight) hours as needed for pain.     albuterol (VENTOLIN HFA) 108 (90 Base) MCG/ACT inhaler INHALE 2 PUFFS INTO THE LUNGS EVERY 4 HOURS AS NEEDED FOR WHEEZING OR SHORTNESS OF BREATH 8.5 g 0   ALPRAZolam (XANAX) 0.5 MG tablet TAKE 1 TABLET(0.5 MG)  BY MOUTH THREE TIMES DAILY AS NEEDED 90 tablet 2   azelastine (ASTELIN) 0.1 % nasal spray Place 2 sprays into both nostrils 2 (two) times daily. Use in each nostril as directed 30 mL 12   baclofen (LIORESAL) 10 MG tablet TAKE 1 TO 2 TABLETS(10 TO 20 MG) BY MOUTH AT BEDTIME AS NEEDED FOR MUSCLE SPASMS 60 tablet 0   BD PEN NEEDLE NANO 2ND GEN 32G X 4 MM MISC USE AS DIRECTED THREE TIMES DAILY 300 each 3   benazepril-hydrochlorthiazide (LOTENSIN HCT) 20-25 MG tablet Take 1 tablet by mouth daily.     Blood Glucose Monitoring Suppl (ONETOUCH VERIO) w/Device KIT Use as directed daily E11.9 1 kit 0   cetirizine (ZYRTEC) 10 MG tablet TAKE 1 TABLET BY MOUTH DAILY 90 tablet 1   colchicine 0.6 MG tablet Take 1 tablet (0.6 mg total) by mouth daily. 60 tablet 5   cyclobenzaprine (FLEXERIL) 5 MG tablet TAKE 1 TABLET(5 MG) BY MOUTH THREE TIMES DAILY AS NEEDED FOR MUSCLE SPASMS 40 tablet 2   doxycycline (VIBRAMYCIN) 100 MG capsule Take 1 capsule (100 mg total) by mouth 2 (two) times daily. 20 capsule 0   fluconazole (DIFLUCAN) 150 MG tablet Take 1 tablet (150 mg total) by mouth once as needed for up to 1 dose (yeast infection, can repeat x1 if not improved). 2 tablet 0   furosemide (LASIX) 40 MG tablet 1 tab by mouth every day in the AM and then 1 as needed only for persistent swelling in the PM 60 tablet 11   gabapentin (NEURONTIN) 300 MG capsule 3 tab by mouth three times daily 270 capsule 5   glucose blood (ONETOUCH VERIO) test strip USE AS DIRECTED THREE TIMES DAILY 100 strip 11   insulin aspart (NOVOLOG FLEXPEN) 100 UNIT/ML FlexPen ADMINISTER 12 UNITS UNDER THE SKIN FOUR TIMES DAILY 15 mL 2   levalbuterol (XOPENEX HFA) 45 MCG/ACT inhaler Inhale 1-2 puffs into the lungs every 8 (eight) hours as needed for wheezing or shortness of breath. USE 2 PUFFS 3 TIMES DAILY X 5 DAYS THEN BACK TO HOME REGIMEN AS NEEDED. 1 each 0   mometasone-formoterol (DULERA) 200-5 MCG/ACT AERO Inhale 2 puffs into the lungs in the morning  and at bedtime. 8.8 g 5   nystatin (MYCOSTATIN) 100000 UNIT/ML suspension TAKE 5 MLS BY MOUTH 4(FOUR) TIMES DAILY. SWISH FOR 30 SECONDS AND SWALLOW (Patient not taking: Reported on 10/30/2021) 180 mL 0   omeprazole (PRILOSEC) 40 MG capsule TAKE 1 CAPSULE(40 MG) BY MOUTH DAILY 90 capsule 3   ondansetron (ZOFRAN ODT) 4 MG disintegrating tablet Take 1 tablet (4 mg total) by mouth every 8 (eight) hours as needed for nausea or vomiting. 30 tablet 1   oxyCODONE-acetaminophen (PERCOCET) 10-325 MG tablet Take 1 tablet by mouth every 8 (eight) hours as needed for pain.   0   rizatriptan (MAXALT-MLT) 10 MG disintegrating tablet Take 1 tablet earliest onset of migraine.  May  repeat in 2 hours if needed.  Maximum 2 tablets in 24 hours 9 tablet 11   rosuvastatin (CRESTOR) 40 MG tablet TAKE 1 TABLET(40 MG) BY MOUTH DAILY 90 tablet 2   sertraline (ZOLOFT) 100 MG tablet TAKE 2 TABLETS(200 MG) BY MOUTH DAILY 180 tablet 3   Tiotropium Bromide Monohydrate (SPIRIVA RESPIMAT) 1.25 MCG/ACT AERS Inhale 2 puffs into the lungs daily. 4 g 0   TURMERIC PO Take 1 tablet by mouth daily.     vitamin B-12 (CYANOCOBALAMIN) 1000 MCG tablet Take 1,000 mcg by mouth daily.     zonisamide (ZONEGRAN) 100 MG capsule Take 2 capsules (200 mg total) by mouth daily. 60 capsule 11    No results found. However, due to the size of the patient record, not all encounters were searched. Please check Results Review for a complete set of results. No results found.  ROS: Pain with rom of the left lower extremity  Physical Exam: Alert and oriented 46 y.o. female in no acute distress Cranial nerves 2-12 intact Cervical spine: full rom with no tenderness, nv intact distally Chest: active breath sounds bilaterally, no wheeze rhonchi or rales Heart: regular rate and rhythm, no murmur Abd: non tender non distended with active bowel sounds Hip is stable with rom  Left knee painful rom and antalgic gait Medial joint line tenderness No rashes or  edema distally  Assessment/Plan Assessment: left knee meniscal tear  Plan:  Patient will undergo a left knee scope by Dr. Veverly Fells at [place] Risks benefits and expectations were discussed with the patient. Patient understand risks, benefits and expectations and wishes to proceed. Preoperative templating of the joint replacement has been completed, documented, and submitted to the Operating Room personnel in order to optimize intra-operative equipment management.   Merla Riches PA-C, MPAS [facility] is now [facility] [employer] [address], [place], [place], [place] [address] Phone: [phone] [email or web address]   [employer]

## 2021-12-30 NOTE — Progress Notes (Addendum)
COVID swab appointment: N/A  COVID Vaccine Completed: Yes x2 Date COVID Vaccine completed: 01-26-20 02-23-20 Has received booster: COVID vaccine manufacturer: [place]      Date of COVID positive in last 90 days:  PCP - Cathlean Cower, MD Cardiologist -  Pulmonologist - Baltazar Apo, MD  Pulmonary clearance in Epic dated 10-30-21 by Dr. Lamonte Sakai  Chest x-ray - 11-11-21 [facility] EKG - 11-11-21 [facility]. Copy on chart Stress Test -  ECHO - greater than 2 years Epic Cardiac Cath -  Pacemaker/ICD device last checked: Spinal Cord Stimulator:  Bowel Prep -   Sleep Study -  CPAP -   Fasting Blood Sugar -  Checks Blood Sugar _____ times a day  Blood Thinner Instructions: Aspirin Instructions: Last Dose:  Activity level:  Can go up a flight of stairs and perform activities of daily living without stopping and without symptoms of chest pain or shortness of breath.   Able to exercise without symptoms  Unable to go up a flight of stairs without symptoms of      Anesthesia review: CHF COPD, sarcoidosis of the lungs  Patient denies shortness of breath, fever, cough and chest pain at PAT appointment   Patient verbalized understanding of instructions that were given to them at the PAT appointment. Patient was also instructed that they will need to review over the PAT instructions again at home before surgery.

## 2021-12-30 NOTE — Patient Instructions (Signed)
DUE TO COVID-19 ONLY ONE VISITOR  (aged 48 and older)  IS ALLOWED TO COME WITH YOU AND STAY IN THE WAITING ROOM ONLY DURING PRE OP AND PROCEDURE.   **NO VISITORS ARE ALLOWED IN THE SHORT STAY AREA OR RECOVERY ROOM!!**  You are not required to quarantine, however you are required to wear a well-fitted mask when you are out and around people not in your household.  Hand Hygiene often Do NOT share personal items Notify your provider if you are in close contact with someone who has COVID or you develop fever 100.4 or greater, new onset of sneezing, cough, sore throat, shortness of breath or body aches.        Your procedure is scheduled on: Friday, 01-15-22   Report to [facility] Main Entrance    Report to admitting at 1:00 PM   Call this number if you have problems the morning of surgery [phone]   Do not eat food :After Midnight.   After Midnight you may have the following liquids until 12:15 PM DAY OF SURGERY  Water Black Coffee (sugar ok, NO MILK/CREAM OR CREAMERS)  Tea (sugar ok, NO MILK/CREAM OR CREAMERS) regular and decaf                             Plain Jell-O (NO RED)                                           Fruit ices (not with fruit pulp, NO RED)                                     Popsicles (NO RED)                                                                  Juice: apple, WHITE grape, WHITE cranberry Sports drinks like Gatorade (NO RED) Clear broth(vegetable,chicken,beef)               Drink 1 G2 drink AT 12:15 PM the morning of surgery.    The day of surgery:  Drink ONE (1) Pre-Surgery G2 at 12:15 PM the morning of surgery. Drink in one sitting. Do not sip.  This drink was given to you during your hospital  pre-op appointment visit. Nothing else to drink after completing the Pre-Surgery G2.          If you have questions, please contact your surgeons office.   FOLLOW ANY ADDITIONAL PRE OP INSTRUCTIONS YOU RECEIVED FROM YOUR SURGEON'S  OFFICE!!!     Oral Hygiene is also important to reduce your risk of infection.                                    Remember - BRUSH YOUR TEETH THE MORNING OF SURGERY WITH YOUR REGULAR TOOTHPASTE   Do NOT smoke after Midnight  Take these medicines the morning of surgery with A SIP OF WATER: Tylenol, Alprazolam, Zyrtec, Colchicine,  Gabapentin, Omeprazole, Rosuvastatin, Sertraline.  Okay to use inhalers/nebulizers  How to Manage Your Diabetes Before and After Surgery  Why is it important to control my blood sugar before and after surgery? Improving blood sugar levels before and after surgery helps healing and can limit problems. A way of improving blood sugar control is eating a healthy diet by:  Eating less sugar and carbohydrates  Increasing activity/exercise  Talking with your doctor about reaching your blood sugar goals High blood sugars (greater than 180 mg/dL) can raise your risk of infections and slow your recovery, so you will need to focus on controlling your diabetes during the weeks before surgery. Make sure that the doctor who takes care of your diabetes knows about your planned surgery including the date and location.  How do I manage my blood sugar before surgery? Check your blood sugar at least 4 times a day, starting 2 days before surgery, to make sure that the level is not too high or low. Check your blood sugar the morning of your surgery when you wake up and every 2 hours until you get to the Short Stay unit. If your blood sugar is less than 70 mg/dL, you will need to treat for low blood sugar: Do not take insulin. Treat a low blood sugar (less than 70 mg/dL) with  cup of clear juice (cranberry or apple), 4 glucose tablets, OR glucose gel. Recheck blood sugar in 15 minutes after treatment (to make sure it is greater than 70 mg/dL). If your blood sugar is not greater than 70 mg/dL on recheck, call [phone] for further instructions. Report your blood sugar to the short  stay nurse when you get to Short Stay.  If you are admitted to the hospital after surgery: Your blood sugar will be checked by the staff and you will probably be given insulin after surgery (instead of oral diabetes medicines) to make sure you have good blood sugar levels. The goal for blood sugar control after surgery is 80-180 mg/dL.   WHAT DO I DO ABOUT MY DIABETES MEDICATION?  Do not take oral diabetes medicines (pills) the morning of surgery.  THE NIGHT BEFORE SURGERY:  No bedtime dose.       THE MORNING OF SURGERY:  If CBG 220 or higher administer 50% of Insulin Aspart.  Reviewed and Endorsed by [facility] Patient Education Committee, August 2015   Bring CPAP mask and tubing day of surgery.                              You may not have any metal on your body including hair pins, jewelry, and body piercing             Do not wear make-up, lotions, powders, perfumes or deodorant  Do not wear nail polish including gel and S&S, artificial/acrylic nails, or any other type of covering on natural nails including finger and toenails. If you have artificial nails, gel coating, etc. that needs to be removed by a nail salon please have this removed prior to surgery or surgery may need to be canceled/ delayed if the surgeon/ anesthesia feels like they are unable to be safely monitored.   Do not shave  48 hours prior to surgery.           Do not bring valuables to the hospital. [place].   Contacts, dentures or bridgework may not be  worn into surgery.   Patients discharged on the day of surgery will not be allowed to drive home.  Someone NEEDS to stay with you for the first 24 hours after anesthesia.  Special Instructions: Bring a copy of your healthcare power of attorney and living will documents the day of surgery if you haven't scanned them before.  Please read over the following fact sheets you were given: IF YOU HAVE QUESTIONS ABOUT YOUR PRE-OP  INSTRUCTIONS PLEASE CALL [facility] - Preparing for Surgery Before surgery, you can play an important role.  Because skin is not sterile, your skin needs to be as free of germs as possible.  You can reduce the number of germs on your skin by washing with CHG (chlorahexidine gluconate) soap before surgery.  CHG is an antiseptic cleaner which kills germs and bonds with the skin to continue killing germs even after washing. Please DO NOT use if you have an allergy to CHG or antibacterial soaps.  If your skin becomes reddened/irritated stop using the CHG and inform your nurse when you arrive at Short Stay. Do not shave (including legs and underarms) for at least 48 hours prior to the first CHG shower.  You may shave your face/neck.  Please follow these instructions carefully:  1.  Shower with CHG Soap the night before surgery and the  morning of surgery.  2.  If you choose to wash your hair, wash your hair first as usual with your normal  shampoo.  3.  After you shampoo, rinse your hair and body thoroughly to remove the shampoo.                             4.  Use CHG as you would any other liquid soap.  You can apply chg directly to the skin and wash.  Gently with a scrungie or clean washcloth.  5.  Apply the CHG Soap to your body ONLY FROM THE NECK DOWN.   Do   not use on face/ open                           Wound or open sores. Avoid contact with eyes, ears mouth and   genitals (private parts).                       Wash face,  Genitals (private parts) with your normal soap.             6.  Wash thoroughly, paying special attention to the area where your    surgery  will be performed.  7.  Thoroughly rinse your body with warm water from the neck down.  8.  DO NOT shower/wash with your normal soap after using and rinsing off the CHG Soap.                9.  Pat yourself dry with a clean towel.            10.  Wear clean pajamas.            11.  Place clean sheets on your bed the  night of your first shower and do not  sleep with pets. Day of Surgery : Do not apply any lotions/deodorants the morning of surgery.  Please wear clean clothes to the hospital/surgery center.  FAILURE TO FOLLOW THESE INSTRUCTIONS MAY RESULT IN THE CANCELLATION  OF YOUR SURGERY  PATIENT SIGNATURE_________________________________  NURSE SIGNATURE__________________________________  ________________________________________________________________________     Nicole Melendez  An incentive spirometer is a tool that can help keep your lungs clear and active. This tool measures how well you are filling your lungs with each breath. Taking long deep breaths may help reverse or decrease the chance of developing breathing (pulmonary) problems (especially infection) following: A long period of time when you are unable to move or be active. BEFORE THE PROCEDURE  If the spirometer includes an indicator to show your best effort, your nurse or respiratory therapist will set it to a desired goal. If possible, sit up straight or lean slightly forward. Try not to slouch. Hold the incentive spirometer in an upright position. INSTRUCTIONS FOR USE  Sit on the edge of your bed if possible, or sit up as far as you can in bed or on a chair. Hold the incentive spirometer in an upright position. Breathe out normally. Place the mouthpiece in your mouth and seal your lips tightly around it. Breathe in slowly and as deeply as possible, raising the piston or the ball toward the top of the column. Hold your breath for 3-5 seconds or for as long as possible. Allow the piston or ball to fall to the bottom of the column. Remove the mouthpiece from your mouth and breathe out normally. Rest for a few seconds and repeat Steps 1 through 7 at least 10 times every 1-2 hours when you are awake. Take your time and take a few normal breaths between deep breaths. The spirometer may include an indicator to show your best effort.  Use the indicator as a goal to work toward during each repetition. After each set of 10 deep breaths, practice coughing to be sure your lungs are clear. If you have an incision (the cut made at the time of surgery), support your incision when coughing by placing a pillow or rolled up towels firmly against it. Once you are able to get out of bed, walk around indoors and cough well. You may stop using the incentive spirometer when instructed by your caregiver.  RISKS AND COMPLICATIONS Take your time so you do not get dizzy or light-headed. If you are in pain, you may need to take or ask for pain medication before doing incentive spirometry. It is harder to take a deep breath if you are having pain. AFTER USE Rest and breathe slowly and easily. It can be helpful to keep track of a log of your progress. Your caregiver can provide you with a simple table to help with this. If you are using the spirometer at home, follow these instructions: [place] IF:  You are having difficultly using the spirometer. You have trouble using the spirometer as often as instructed. Your pain medication is not giving enough relief while using the spirometer. You develop fever of 100.5 F (38.1 C) or higher. SEEK IMMEDIATE MEDICAL CARE IF:  You cough up bloody sputum that had not been present before. You develop fever of 102 F (38.9 C) or greater. You develop worsening pain at or near the incision site. MAKE SURE YOU:  Understand these instructions. Will watch your condition. Will get help right away if you are not doing well or get worse. Document Released: 02/21/2007 Document Revised: 01/03/2012 Document Reviewed: 04/24/2007 [facility] Patient Information 2014 [place], [place].   ________________________________________________________________________

## 2022-01-05 ENCOUNTER — Encounter (HOSPITAL_COMMUNITY)
Admission: RE | Admit: 2022-01-05 | Discharge: 2022-01-05 | Disposition: A | Discharge status: Home / Self Care | Payer: Worker's Compensation | Source: Ambulatory Visit | Attending: Orthopedic Surgery | Admitting: Orthopedic Surgery

## 2022-01-05 ENCOUNTER — Other Ambulatory Visit: Payer: Self-pay

## 2022-01-05 ENCOUNTER — Encounter (HOSPITAL_COMMUNITY): Payer: Self-pay

## 2022-01-05 VITALS — BP 157/93 | HR 86 | Temp 98.4°F | Resp 20 | Ht 67.0 in | Wt 327.4 lb

## 2022-01-05 DIAGNOSIS — Z01812 Encounter for preprocedural laboratory examination: Secondary | ICD-10-CM | POA: Insufficient documentation

## 2022-01-05 DIAGNOSIS — Z794 Long term (current) use of insulin: Secondary | ICD-10-CM | POA: Diagnosis not present

## 2022-01-05 DIAGNOSIS — E119 Type 2 diabetes mellitus without complications: Secondary | ICD-10-CM | POA: Insufficient documentation

## 2022-01-05 DIAGNOSIS — I2510 Atherosclerotic heart disease of native coronary artery without angina pectoris: Secondary | ICD-10-CM | POA: Diagnosis not present

## 2022-01-05 DIAGNOSIS — Z01818 Encounter for other preprocedural examination: Secondary | ICD-10-CM

## 2022-01-05 HISTORY — DX: Unspecified osteoarthritis, unspecified site: M19.90

## 2022-01-05 HISTORY — DX: Chronic obstructive pulmonary disease, unspecified: J44.9

## 2022-01-05 HISTORY — DX: Pneumonia, unspecified organism: J18.9

## 2022-01-05 LAB — CBC
HCT: 40.9 % (ref 36.0–46.0)
Hemoglobin: 13.3 g/dL (ref 12.0–15.0)
MCH: 28.5 pg (ref 26.0–34.0)
MCHC: 32.5 g/dL (ref 30.0–36.0)
MCV: 87.6 fL (ref 80.0–100.0)
Platelets: 296 10*3/uL (ref 150–400)
RBC: 4.67 MIL/uL (ref 3.87–5.11)
RDW: 13.4 % (ref 11.5–15.5)
WBC: 7.6 10*3/uL (ref 4.0–10.5)
nRBC: 0 % (ref 0.0–0.2)

## 2022-01-05 LAB — BASIC METABOLIC PANEL
Anion gap: 9 (ref 5–15)
BUN: 11 mg/dL (ref 6–20)
CO2: 24 mmol/L (ref 22–32)
Calcium: 9 mg/dL (ref 8.9–10.3)
Chloride: 106 mmol/L (ref 98–111)
Creatinine, Ser: 0.64 mg/dL (ref 0.44–1.00)
GFR, Estimated: >60 mL/min (ref >60)
Glucose, Bld: 165 mg/dL — ABNORMAL HIGH (ref 70–99)
Potassium: 3.5 mmol/L (ref 3.5–5.1)
Sodium: 139 mmol/L (ref 135–145)

## 2022-01-05 LAB — GLUCOSE, CAPILLARY: Glucose-Capillary: 203 mg/dL — ABNORMAL HIGH (ref 70–99)

## 2022-01-06 LAB — HEMOGLOBIN A1C
Hgb A1c MFr Bld: 6.8 % — ABNORMAL HIGH (ref 4.8–5.6)
Mean Plasma Glucose: 148 mg/dL

## 2022-01-07 NOTE — Progress Notes (Signed)
Anesthesia Chart Review ? ? Case: [id number] Date/Time: 01/15/22 1509  ? Procedure: ARTHROSCOPY KNEE (Left: Knee)  ? Anesthesia type: Choice  ? Pre-op diagnosis: left medial and lateral meniscus tear  ? Location: [facility] ROOM 06 / [facility]  ? Surgeons: Netta Cedars, MD  ? ?  ? ? ?DISCUSSION:46 y.o. former smoker with h/o GERD, HTN, sarcoidosis, COPD, DM II, left medial and lateral meniscus tear scheduled for above procedure 01/15/2022 with Dr. Netta Cedars.  ? ?Pt last seen by pulmonology 10/30/2021. Per OV note, "She is about to undergo left knee arthroscopy.  Explained to her that she is at moderate increased risk for anesthesia or surgical procedure due to her sarcoid and her obesity.  That said she is clinically stable and there is no absolute contraindication to proceeding as long as the benefits outweigh these risks.  I will forward this note to orthopedics for her." ? ?Anticipate pt can proceed with planned procedure barring acute status change.   ?VS: BP (!) 157/93   Pulse 86   Temp 36.9 ?C (Oral)   Resp 20   Ht 5' 7" (1.702 m)   Wt (!) 148.5 kg   LMP 07/17/2012   SpO2 100%   BMI 51.28 kg/m?  ? ?PROVIDERS: ?Biagio Borg, MD is PCP  ? ?Baltazar Apo, MD is Pulmonologist  ?LABS: Labs reviewed: Acceptable for surgery. ?(all labs ordered are listed, but only abnormal results are displayed) ? ?Labs Reviewed  ?HEMOGLOBIN A1C - Abnormal; Notable for the following components:  ?    Result Value  ? Hgb A1c MFr Bld 6.8 (*)   ? All other components within normal limits  ?BASIC METABOLIC PANEL - Abnormal; Notable for the following components:  ? Glucose, Bld 165 (*)   ? All other components within normal limits  ?GLUCOSE, CAPILLARY - Abnormal; Notable for the following components:  ? Glucose-Capillary 203 (*)   ? All other components within normal limits  ?CBC  ? ? ? ?IMAGES: ? ? ?EKG: ? ? ?CV: ?Echo 09/26/2017 ?- Left ventricle: The cavity size was normal. Systolic function was  ?  normal. The estimated ejection fraction  was in the range of 60%  ?  to 65%. Wall motion was normal; there were no regional wall  ?  motion abnormalities. Left ventricular diastolic function  ?  parameters were normal.  ?- Aortic valve: Transvalvular velocity was within the normal range.  ?  There was no stenosis. There was no regurgitation.  ?- Mitral valve: Transvalvular velocity was within the normal range.  ?  There was no evidence for stenosis. There was no regurgitation.  ?- Right ventricle: The cavity size was normal. Wall thickness was  ?  normal. Systolic function was normal.  ?- Atrial septum: No defect or patent foramen ovale was identified.  ?- Tricuspid valve: There was mild regurgitation.  ?- Pulmonary arteries: Systolic pressure was within the normal  ?  range. PA peak pressure: 27 mm Hg (S).  ?Past Medical History:  ?Diagnosis Date  ? Allergic rhinitis, cause unspecified   ? Anemia   ? Anxiety   ? Arthritis   ? COPD (chronic obstructive pulmonary disease) ([place])   ? Depression   ? Diabetes mellitus type II   ? steroid related, patient states "im no longer diabetic"  ? Essential hypertension 08/08/2015  ? GERD (gastroesophageal reflux disease)   ? History of blood transfusion   ? Hyperlipidemia   ? Menorrhagia   ? Migraine   ?  Morbid obesity ([place])   ? Otitis media 06/28/2015  ? Peptic ulcer disease   ? Pneumonia   ? Positive ANA (antinuclear antibody) 02/14/2012  ? Sarcoid   ? including hand per rheumatology-Dr. Amil Amen  ? Sarcoidosis of lung ([place])   ? Shortness of breath   ? on exertion  ? Varicose veins with pain   ? ? ?Past Surgical History:  ?Procedure Laterality Date  ? ABDOMINAL HYSTERECTOMY    ? COLONOSCOPY WITH PROPOFOL N/A 10/19/2016  ? Procedure: COLONOSCOPY WITH PROPOFOL;  Surgeon: Mauri Pole, MD;  Location[facility];  Service: Endoscopy;  Laterality: N/A;  ? ESOPHAGOGASTRODUODENOSCOPY (EGD) WITH PROPOFOL N/A 10/19/2016  ? Procedure: ESOPHAGOGASTRODUODENOSCOPY (EGD) WITH PROPOFOL;  Surgeon: Mauri Pole, MD;   Location[facility];  Service: Endoscopy;  Laterality: N/A;  ? HERNIA MESH REMOVAL  02/2013  ? HERNIA REPAIR    ? uterine ablation  03/2010  ? WISDOM TOOTH EXTRACTION    ? ? ?MEDICATIONS: ? acetaminophen (TYLENOL) 650 MG CR tablet  ? albuterol (VENTOLIN HFA) 108 (90 Base) MCG/ACT inhaler  ? ALPRAZolam (XANAX) 0.5 MG tablet  ? azelastine (ASTELIN) 0.1 % nasal spray  ? baclofen (LIORESAL) 10 MG tablet  ? BD PEN NEEDLE NANO 2ND GEN 32G X 4 MM MISC  ? benazepril-hydrochlorthiazide (LOTENSIN HCT) 20-25 MG tablet  ? Blood Glucose Monitoring Suppl (ONETOUCH VERIO) w/Device KIT  ? cyclobenzaprine (FLEXERIL) 5 MG tablet  ? gabapentin (NEURONTIN) 300 MG capsule  ? glucose blood (ONETOUCH VERIO) test strip  ? insulin aspart (NOVOLOG FLEXPEN) 100 UNIT/ML FlexPen  ? levalbuterol (XOPENEX HFA) 45 MCG/ACT inhaler  ? mometasone-formoterol (DULERA) 200-5 MCG/ACT AERO  ? nystatin (MYCOSTATIN) 100000 UNIT/ML suspension  ? omeprazole (PRILOSEC) 40 MG capsule  ? ondansetron (ZOFRAN ODT) 4 MG disintegrating tablet  ? oxyCODONE-acetaminophen (PERCOCET) 10-325 MG tablet  ? rizatriptan (MAXALT-MLT) 10 MG disintegrating tablet  ? rosuvastatin (CRESTOR) 40 MG tablet  ? sertraline (ZOLOFT) 100 MG tablet  ? telmisartan-hydrochlorothiazide (MICARDIS HCT) 40-12.5 MG tablet  ? Tiotropium Bromide Monohydrate (SPIRIVA RESPIMAT) 1.25 MCG/ACT AERS  ? TURMERIC PO  ? vitamin B-12 (CYANOCOBALAMIN) 1000 MCG tablet  ? zonisamide (ZONEGRAN) 100 MG capsule  ? ?No current facility-administered medications for this encounter.  ? ? ?Konrad Felix Ward, PA-C ?[facility] ?[phone] ? ? ? ? ? ? ?

## 2022-01-14 NOTE — Anesthesia Preprocedure Evaluation (Addendum)
Anesthesia Evaluation  ?Patient identified by MRN, date of birth, ID band ?Patient awake ? ? ? ?Reviewed: ?Allergy & Precautions, NPO status , Patient's Chart, lab work & pertinent test results ? ?Airway ?Mallampati: II ? ?TM Distance: >3 FB ?Neck ROM: Full ? ? ? Dental ?no notable dental hx. ?(+) Dental Advisory Given, Poor Dentition, Chipped,  ?  ?Pulmonary ?COPD,  COPD inhaler, former smoker,  ?Sarcoidosis ?  ?Pulmonary exam normal ?breath sounds clear to auscultation ? ? ? ? ? ? Cardiovascular ?hypertension, +CHF  ?Normal cardiovascular exam ?Rhythm:Regular Rate:Normal ? ? ?  ?Neuro/Psych ? Headaches,   ? GI/Hepatic ?Neg liver ROS, GERD  ,  ?Endo/Other  ?diabetesMorbid obesity (BMI 51.27) ? Renal/GU ?Lab Results ?     Component                Value               Date                 ?     CREATININE               0.64                01/05/2022           ?        K                        3.5                 01/05/2022           ?       ?  ? ?  ?Musculoskeletal ? ?(+) Arthritis ,  ? Abdominal ?(+) + obese,   ?Peds ? Hematology ?negative hematology ROS ?(+) Lab Results ?     Component                Value               Date                 ?     WBC                      7.6                 01/05/2022           ?     HGB                      13.3                01/05/2022           ?     HCT                      40.9                01/05/2022           ?     MCV                      87.6                01/05/2022           ?     PLT  296                 01/05/2022           ?   ?Anesthesia Other Findings ?ALL: see list ? Reproductive/Obstetrics ? ?  ? ? ? ? ? ? ? ? ? ? ? ? ? ?  ?  ? ? ? ? ? ? ?Anesthesia Physical ?Anesthesia Plan ? ?ASA: 3 ? ?Anesthesia Plan: General  ? ?Post-op Pain Management: Dilaudid IV, Tylenol PO (pre-op)* and Ketamine IV*  ? ?Induction: Intravenous ? ?PONV Risk Score and Plan: 4 or greater and Treatment may vary due to age or medical  condition, Dexamethasone, Ondansetron and Midazolam ? ?Airway Management Planned: LMA ? ?Additional Equipment: None ? ?Intra-op Plan:  ? ?Post-operative Plan:  ? ?Informed Consent: I have reviewed the patients History and Physical, chart, labs and discussed the procedure including the risks, benefits and alternatives for the proposed anesthesia with the patient or authorized representative who has indicated his/her understanding and acceptance.  ? ? ? ?Dental advisory given ? ?Plan Discussed with: CRNA and Anesthesiologist ? ?Anesthesia Plan Comments:   ? ? ? ? ? ?Anesthesia Quick Evaluation ? ?

## 2022-01-15 ENCOUNTER — Ambulatory Visit (HOSPITAL_COMMUNITY): Payer: Worker's Compensation | Admitting: Physician Assistant

## 2022-01-15 ENCOUNTER — Encounter (HOSPITAL_COMMUNITY)
Admission: RE | Disposition: A | Discharge status: Home / Self Care | Payer: Self-pay | Source: Home / Self Care | Attending: Orthopedic Surgery

## 2022-01-15 ENCOUNTER — Ambulatory Visit (HOSPITAL_COMMUNITY)
Admission: RE | Admit: 2022-01-15 | Discharge: 2022-01-15 | Disposition: A | Discharge status: Home / Self Care | Payer: Worker's Compensation | Attending: Orthopedic Surgery | Admitting: Orthopedic Surgery

## 2022-01-15 ENCOUNTER — Ambulatory Visit (HOSPITAL_BASED_OUTPATIENT_CLINIC_OR_DEPARTMENT_OTHER): Payer: Worker's Compensation | Admitting: Anesthesiology

## 2022-01-15 ENCOUNTER — Encounter (HOSPITAL_COMMUNITY): Payer: Self-pay | Admitting: Orthopedic Surgery

## 2022-01-15 DIAGNOSIS — Z6841 Body Mass Index (BMI) 40.0 and over, adult: Secondary | ICD-10-CM | POA: Insufficient documentation

## 2022-01-15 DIAGNOSIS — M2242 Chondromalacia patellae, left knee: Secondary | ICD-10-CM | POA: Diagnosis not present

## 2022-01-15 DIAGNOSIS — J449 Chronic obstructive pulmonary disease, unspecified: Secondary | ICD-10-CM | POA: Insufficient documentation

## 2022-01-15 DIAGNOSIS — I509 Heart failure, unspecified: Secondary | ICD-10-CM | POA: Insufficient documentation

## 2022-01-15 DIAGNOSIS — S83242A Other tear of medial meniscus, current injury, left knee, initial encounter: Secondary | ICD-10-CM

## 2022-01-15 DIAGNOSIS — I110 Hypertensive heart disease with heart failure: Secondary | ICD-10-CM | POA: Insufficient documentation

## 2022-01-15 DIAGNOSIS — E119 Type 2 diabetes mellitus without complications: Secondary | ICD-10-CM | POA: Diagnosis not present

## 2022-01-15 DIAGNOSIS — Z87891 Personal history of nicotine dependence: Secondary | ICD-10-CM | POA: Insufficient documentation

## 2022-01-15 DIAGNOSIS — M94262 Chondromalacia, left knee: Secondary | ICD-10-CM | POA: Diagnosis not present

## 2022-01-15 DIAGNOSIS — X58XXXA Exposure to other specified factors, initial encounter: Secondary | ICD-10-CM | POA: Diagnosis not present

## 2022-01-15 HISTORY — PX: KNEE ARTHROSCOPY: SHX127

## 2022-01-15 LAB — GLUCOSE, CAPILLARY
Glucose-Capillary: 132 mg/dL — ABNORMAL HIGH (ref 70–99)
Glucose-Capillary: 109 mg/dL — ABNORMAL HIGH (ref 70–99)

## 2022-01-15 SURGERY — ARTHROSCOPY, KNEE
Anesthesia: General | Site: Knee | Laterality: Left

## 2022-01-15 MED ORDER — OXYCODONE HCL 5 MG PO TABS
ORAL_TABLET | ORAL | Status: AC
  Filled 2022-01-15: qty 1

## 2022-01-15 MED ORDER — SODIUM CHLORIDE 0.9 % IR SOLN
Status: DC | PRN
  Administered 2022-01-15: 1000 mL

## 2022-01-15 MED ORDER — MIDAZOLAM HCL 5 MG/5ML IJ SOLN
INTRAMUSCULAR | Status: DC | PRN
  Administered 2022-01-15: 2 mg via INTRAVENOUS

## 2022-01-15 MED ORDER — LIDOCAINE 2% (20 MG/ML) 5 ML SYRINGE
INTRAMUSCULAR | Status: DC | PRN
  Administered 2022-01-15: 60 mg via INTRAVENOUS

## 2022-01-15 MED ORDER — EPHEDRINE 5 MG/ML INJ
INTRAVENOUS | Status: AC
  Filled 2022-01-15: qty 10

## 2022-01-15 MED ORDER — OXYCODONE HCL 5 MG PO TABS: 5.0000 mg | ORAL_TABLET | Freq: Once | ORAL | Status: DC | PRN

## 2022-01-15 MED ORDER — CHLORHEXIDINE GLUCONATE 0.12 % MT SOLN: 15.0000 mL | Freq: Once | OROMUCOSAL | Status: DC

## 2022-01-15 MED ORDER — OXYCODONE HCL 5 MG/5ML PO SOLN: 5.0000 mg | Freq: Once | ORAL | Status: AC | PRN

## 2022-01-15 MED ORDER — MIDAZOLAM HCL 2 MG/2ML IJ SOLN
INTRAMUSCULAR | Status: AC
  Filled 2022-01-15: qty 2

## 2022-01-15 MED ORDER — HYDROMORPHONE HCL 1 MG/ML IJ SOLN: 0.2500 mg | INTRAMUSCULAR | Status: DC | PRN

## 2022-01-15 MED ORDER — BUPIVACAINE-EPINEPHRINE (PF) 0.25% -1:200000 IJ SOLN
INTRAMUSCULAR | Status: AC
  Filled 2022-01-15: qty 30

## 2022-01-15 MED ORDER — DEXMEDETOMIDINE (PRECEDEX) IN NS 20 MCG/5ML (4 MCG/ML) IV SYRINGE
PREFILLED_SYRINGE | INTRAVENOUS | Status: DC | PRN
  Administered 2022-01-15: 12 ug via INTRAVENOUS

## 2022-01-15 MED ORDER — ASPIRIN 81 MG PO CHEW: 81.0000 mg | CHEWABLE_TABLET | Freq: Two times a day (BID) | ORAL | 0 refills | Status: AC

## 2022-01-15 MED ORDER — OXYCODONE-ACETAMINOPHEN 10-325 MG PO TABS
1.0000 | ORAL_TABLET | ORAL | 0 refills | Status: DC | PRN
Start: 2022-01-15 — End: 2023-09-14

## 2022-01-15 MED ORDER — VANCOMYCIN HCL 1500 MG/300ML IV SOLN
1500.0000 mg | INTRAVENOUS | Status: AC
  Administered 2022-01-15 (×2): 1500 mg via INTRAVENOUS
  Filled 2022-01-15: qty 300

## 2022-01-15 MED ORDER — ONDANSETRON HCL 4 MG/2ML IJ SOLN: 4.0000 mg | Freq: Once | INTRAMUSCULAR | Status: DC | PRN

## 2022-01-15 MED ORDER — KETOROLAC TROMETHAMINE 30 MG/ML IJ SOLN
INTRAMUSCULAR | Status: AC
  Filled 2022-01-15: qty 1

## 2022-01-15 MED ORDER — BUPIVACAINE-EPINEPHRINE 0.25% -1:200000 IJ SOLN
INTRAMUSCULAR | Status: DC | PRN
  Administered 2022-01-15: 30 mL

## 2022-01-15 MED ORDER — DEXMEDETOMIDINE (PRECEDEX) IN NS 20 MCG/5ML (4 MCG/ML) IV SYRINGE
PREFILLED_SYRINGE | INTRAVENOUS | Status: AC
  Filled 2022-01-15: qty 10

## 2022-01-15 MED ORDER — ROCURONIUM BROMIDE 10 MG/ML (PF) SYRINGE
PREFILLED_SYRINGE | INTRAVENOUS | Status: AC
  Filled 2022-01-15: qty 20

## 2022-01-15 MED ORDER — KETOROLAC TROMETHAMINE 30 MG/ML IJ SOLN: 30.0000 mg | Freq: Once | INTRAMUSCULAR | Status: DC | PRN

## 2022-01-15 MED ORDER — HYDROMORPHONE HCL 1 MG/ML IJ SOLN
INTRAMUSCULAR | Status: AC
  Filled 2022-01-15: qty 2

## 2022-01-15 MED ORDER — OXYCODONE HCL 5 MG PO TABS
5.0000 mg | ORAL_TABLET | Freq: Once | ORAL | Status: AC | PRN
  Administered 2022-01-15: 5 mg via ORAL

## 2022-01-15 MED ORDER — ORAL CARE MOUTH RINSE: 15.0000 mL | Freq: Once | OROMUCOSAL | Status: DC

## 2022-01-15 MED ORDER — ONDANSETRON HCL 4 MG/2ML IJ SOLN
INTRAMUSCULAR | Status: DC | PRN
  Administered 2022-01-15: 4 mg via INTRAVENOUS

## 2022-01-15 MED ORDER — LACTATED RINGERS IV SOLN: INTRAVENOUS | Status: DC

## 2022-01-15 MED ORDER — PHENYLEPHRINE 40 MCG/ML (10ML) SYRINGE FOR IV PUSH (FOR BLOOD PRESSURE SUPPORT)
PREFILLED_SYRINGE | INTRAVENOUS | Status: AC
  Filled 2022-01-15: qty 10

## 2022-01-15 MED ORDER — LIDOCAINE HCL (PF) 2 % IJ SOLN
INTRAMUSCULAR | Status: AC
  Filled 2022-01-15: qty 15

## 2022-01-15 MED ORDER — DEXAMETHASONE SODIUM PHOSPHATE 10 MG/ML IJ SOLN
INTRAMUSCULAR | Status: AC
  Filled 2022-01-15: qty 2

## 2022-01-15 MED ORDER — ONDANSETRON HCL 4 MG/2ML IJ SOLN
INTRAMUSCULAR | Status: AC
  Filled 2022-01-15: qty 10

## 2022-01-15 MED ORDER — FENTANYL CITRATE (PF) 100 MCG/2ML IJ SOLN
INTRAMUSCULAR | Status: AC
  Filled 2022-01-15: qty 2

## 2022-01-15 MED ORDER — HYDROMORPHONE HCL 1 MG/ML IJ SOLN
0.2500 mg | INTRAMUSCULAR | Status: DC | PRN
  Administered 2022-01-15 (×4): 0.5 mg via INTRAVENOUS

## 2022-01-15 MED ORDER — DEXAMETHASONE SODIUM PHOSPHATE 10 MG/ML IJ SOLN
INTRAMUSCULAR | Status: DC | PRN
Start: 2022-01-15 — End: 2022-01-15
  Administered 2022-01-15: 10 mg via INTRAVENOUS

## 2022-01-15 MED ORDER — OXYCODONE HCL 5 MG/5ML PO SOLN: 5.0000 mg | Freq: Once | ORAL | Status: DC | PRN

## 2022-01-15 MED ORDER — KETOROLAC TROMETHAMINE 30 MG/ML IJ SOLN
INTRAMUSCULAR | Status: DC | PRN
  Administered 2022-01-15: 30 mg via INTRAVENOUS

## 2022-01-15 MED ORDER — PROPOFOL 10 MG/ML IV BOLUS
INTRAVENOUS | Status: AC
  Filled 2022-01-15: qty 20

## 2022-01-15 MED ORDER — FENTANYL CITRATE (PF) 100 MCG/2ML IJ SOLN
INTRAMUSCULAR | Status: DC | PRN
  Administered 2022-01-15: 50 ug via INTRAVENOUS

## 2022-01-15 SURGICAL SUPPLY — 35 items
BNDG ELASTIC 4X5.8 VLCR STR LF (GAUZE/BANDAGES/DRESSINGS) ×2 IMPLANT
BNDG ELASTIC 6X5.8 VLCR STR LF (GAUZE/BANDAGES/DRESSINGS) ×2 IMPLANT
BURR OVAL 8 FLU 4.0X13 (MISCELLANEOUS) ×2 IMPLANT
CLSR STERI-STRIP ANTIMIC 1/2X4 (GAUZE/BANDAGES/DRESSINGS) ×1 IMPLANT
COVER SURGICAL LIGHT HANDLE (MISCELLANEOUS) ×2 IMPLANT
DISSECTOR 3.5MM X 13CM (MISCELLANEOUS) IMPLANT
DISSECTOR 4.0MM X 13CM (MISCELLANEOUS) IMPLANT
DRAPE ARTHROSCOPY W/POUCH 114 (DRAPES) IMPLANT
DRAPE U-SHAPE 47X51 STRL (DRAPES) ×2 IMPLANT
DRSG PAD ABDOMINAL 8X10 ST (GAUZE/BANDAGES/DRESSINGS) ×2 IMPLANT
DURAPREP 26ML APPLICATOR (WOUND CARE) ×2 IMPLANT
ELECT REM PT RETURN 15FT ADLT (MISCELLANEOUS) ×2 IMPLANT
GAUZE 4X4 16PLY ~~LOC~~+RFID DBL (SPONGE) ×2 IMPLANT
GAUZE SPONGE 4X4 12PLY STRL (GAUZE/BANDAGES/DRESSINGS) ×2 IMPLANT
GLOVE SURG ORTHO LTX SZ7.5 (GLOVE) ×2 IMPLANT
GLOVE SURG ORTHO LTX SZ8.5 (GLOVE) ×2 IMPLANT
GLOVE SURG UNDER POLY LF SZ7.5 (GLOVE) ×4 IMPLANT
GLOVE SURG UNDER POLY LF SZ8.5 (GLOVE) ×2 IMPLANT
GOWN STRL REUS W/ TWL XL LVL3 (GOWN DISPOSABLE) ×1 IMPLANT
GOWN STRL REUS W/TWL XL LVL3 (GOWN DISPOSABLE) ×2
KIT BASIN OR (CUSTOM PROCEDURE TRAY) ×2 IMPLANT
KIT TURNOVER KIT A (KITS) IMPLANT
MANIFOLD NEPTUNE II (INSTRUMENTS) ×2 IMPLANT
PACK ARTHROSCOPY WL (CUSTOM PROCEDURE TRAY) ×2 IMPLANT
PADDING CAST ABS 6INX4YD NS (CAST SUPPLIES) ×1
PADDING CAST ABS COTTON 6X4 NS (CAST SUPPLIES) IMPLANT
PADDING CAST COTTON 6X4 STRL (CAST SUPPLIES) ×2 IMPLANT
PORT APPOLLO RF 90DEGREE MULTI (SURGICAL WAND) IMPLANT
PROTECTOR NERVE ULNAR (MISCELLANEOUS) ×2 IMPLANT
STRIP CLOSURE SKIN 1/2X4 (GAUZE/BANDAGES/DRESSINGS) ×2 IMPLANT
SUT ETHILON 3 0 PS 1 (SUTURE) ×2 IMPLANT
SUT MNCRL AB 4-0 PS2 18 (SUTURE) IMPLANT
TOWEL OR 17X26 10 PK STRL BLUE (TOWEL DISPOSABLE) ×2 IMPLANT
TUBING ARTHROSCOPY IRRIG 16FT (MISCELLANEOUS) ×2 IMPLANT
WRAP KNEE MAXI GEL POST OP (GAUZE/BANDAGES/DRESSINGS) ×2 IMPLANT

## 2022-01-15 NOTE — Interval H&P Note (Signed)
History and Physical Interval Note: ? ?01/15/2022 ?11:20 AM ? ?Nicole Melendez  has presented today for surgery, with the diagnosis of left medial and lateral meniscus tear.  The various methods of treatment have been discussed with the patient and family. After consideration of risks, benefits and other options for treatment, the patient has consented to  Procedure(s): ?ARTHROSCOPY KNEE (Left) as a surgical intervention.  The patient's history has been reviewed, patient examined, no change in status, stable for surgery.  I have reviewed the patient's chart and labs.  Questions were answered to the patient's satisfaction.   ? ? ?Augustin Schooling ? ? ?

## 2022-01-15 NOTE — Anesthesia Procedure Notes (Signed)
Procedure Name: LMA Insertion ?Date/Time: 01/15/2022 11:57 AM ?Performed by: Gerald Leitz, CRNA ?Pre-anesthesia Checklist: Patient identified, Patient being monitored, Timeout performed, Emergency Drugs available and Suction available ?Patient Re-evaluated:Patient Re-evaluated prior to induction ?Oxygen Delivery Method: Circle system utilized ?Preoxygenation: Pre-oxygenation with 100% oxygen ?Induction Type: IV induction ?Ventilation: Mask ventilation without difficulty ?LMA: LMA inserted, LMA with gastric port inserted and LMA flexible inserted ?LMA Size: 4.0 ?Tube type: Oral ?Number of attempts: 1 ?Placement Confirmation: positive ETCO2 and breath sounds checked- equal and bilateral ?Tube secured with: Tape ?Dental Injury: Teeth and Oropharynx as per pre-operative assessment  ? ? ? ? ?

## 2022-01-15 NOTE — Op Note (Signed)
NAME: Nicole Melendez, Nicole N. ?MEDICAL RECORD NO: [id number] ?ACCOUNT NO: [id number] ?DATE OF BIRTH: [birth year] ?FACILITY: [facility] ?LOCATION: [facility] ?PHYSICIAN: Doran Heater. Veverly Fells, MD ? ?Operative Report  ? ?DATE OF PROCEDURE: 01/15/2022 ? ?PREOPERATIVE DIAGNOSIS:  Left knee medial meniscus tear. ? ?POSTOPERATIVE DIAGNOSES: ?1.  Left knee medial meniscus tear. ?2.  Chondromalacia tricompartmental grade III and IV. ? ?PROCEDURE PERFORMED:  Left knee arthroscopy with partial medial meniscectomy and chondroplasty not to bleeding bone in 3 compartments.  Chondroplasty was debridement type. ? ?ATTENDING SURGEON:  Doran Heater. Veverly Fells, MD. ? ?ASSISTANT:  None. ? ?ANESTHESIA:  General anesthesia plus local was used. ? ?ESTIMATED BLOOD LOSS:  Minimal. ? ?FLUID REPLACEMENT:  1000 mL crystalloid. ? ?Instrument counts correct.  No complications.  Perioperative antibiotics were given. ? ?INDICATIONS:  The patient is a 46 year old female with worsening left knee pain after a work injury resulting in a torn meniscus.  The patient has failed conservative management, desires operative treatment to relieve pain and restore function.  The  ?patient also had chondromalacia noted on her preoperative MRI.  Risks including but not limited to infection and blood clots discussed with the patient.  Informed consent obtained. ? ?DESCRIPTION OF PROCEDURE:  After an adequate level of general anesthesia was used.  The patient positioned supine on the operating room table.  We kept the leg up straight on a regular table with a lateral post.  A sterile prep and drape, the left leg  ?performed.  Timeout called, verifying correct patient, correct site, we entered the knee using standard portals, including superolateral outflow anterolateral scope and anteromedial working portals.  We identified severe patellofemoral chondromalacia,  ?mostly trochlear. The patellar cartilage was just minimally frayed with some loose flaps and some fibrillation on the superior  lateral portion that was debrided using suction shaver, but the majority of the wear was in the trochlea with some deep grade  ?III and IV damage.  There were some loose edges.  We debrided that with a suction shaver, so that we had all stable cartilage.  Medial and lateral gutters inspected for loose bodies.  We entered the medial compartment.  There was a posterior horn  ?meniscus tear, which was not repairable as it was in a white-white zone.  We performed a partial meniscectomy with baskets and motorized shaver.  About 50% of the posterior horn meniscus had to be removed.  The meniscal root integrity was intact.  There  ?was grade III weightbearing articular cartilage damage over a large area of the femoral condyle starting more anteriorly and extending posteriorly in a skid type manner.  This was released a centimeter wide x 2-3 cm in length.  A loose flaps and  ?fibrillation debrided using suction shaver with tangential technique. Tibial chondromalacia grade III noted also.  ACL and PCL intact.  Lateral compartment entered.  There was some fraying along the posterior meniscal root.  We debrided that gently, but  ?there was no significant meniscal tear anteriorly and posteriorly.  I was able to lift up the meniscus and look on both surfaces.  Both the tibial and femoral surfaces and meniscal root integrity was intact.  Articular cartilage damage noted on the  ?tibial side, grade III in the back of the lateral compartment.  We debrided that using suction shaver with tangential technique.  Final lavage the knee joint, removing all loose debris.  We did our final inspection, making sure there was no other  ?articular cartilage damage.  We sutured the wounds with 3-0  nylon followed by sterile dressing and a compressive wrap from the toes above the thigh.  The patient tolerated the surgery well. ? ? ?PUS ?D: 01/15/2022 1:07:10 pm T: 01/15/2022 7:58:00 pm  ?JOB: [id number]/ [id number]  ?

## 2022-01-15 NOTE — Transfer of Care (Signed)
Immediate Anesthesia Transfer of Care Note ? ?Patient: Nicole Melendez ? ?Procedure(s) Performed: Procedure(s): ?ARTHROSCOPY KNEE (Left) ? ?Patient Location: PACU ? ?Anesthesia Type:General ? ?Level of Consciousness: Alert, Awake, Oriented ? ?Airway & Oxygen Therapy: Patient Spontanous Breathing ? ?Post-op Assessment: Report given to RN ? ?Post vital signs: Reviewed and stable ? ?Last Vitals: There were no vitals filed for this visit. ? ?Complications: No apparent anesthesia complications ? ?

## 2022-01-15 NOTE — Brief Op Note (Signed)
01/15/2022 ? ?1:01 PM ? ?PATIENT:  [name]  46 y.o. female ? ?PRE-OPERATIVE DIAGNOSIS:  left medial meniscus tear ? ?POST-OPERATIVE DIAGNOSIS:  left medial meniscus tear, chondromalacia of the knee joint ? ?PROCEDURE:  Procedure(s): ?ARTHROSCOPY KNEE (Left) partial medial meniscectomy, and chondroplasty in three compartments ? ?SURGEON:  Surgeon(s) and Role: ?   Netta Cedars, MD - Primary ? ?PHYSICIAN ASSISTANT:  ? ?ASSISTANTS: none  ? ?ANESTHESIA:   local and general ? ?EBL:  minimal ? ?BLOOD ADMINISTERED:none ? ?DRAINS: none  ? ?LOCAL MEDICATIONS USED:  MARCAINE    ? ?SPECIMEN:  No Specimen ? ?DISPOSITION OF SPECIMEN:  N/A ? ?COUNTS:  YES ? ?TOURNIQUET:  * No tourniquets in log * ? ?DICTATION: .Other Dictation: Dictation Number [id number] ? ?PLAN OF CARE: Discharge to home after PACU ? ?PATIENT DISPOSITION:  PACU - hemodynamically stable. ?  ?Delay start of Pharmacological VTE agent (>24hrs) due to surgical blood loss or risk of bleeding: no ? ?

## 2022-01-15 NOTE — Discharge Instructions (Signed)
Ice to the knee constantly.  Keep the incision covered and clean and dry for one week, then ok to get it wet in the shower. ? ?Do exercise as instructed every hour, please to prevent stiffness.   ? ?Ankle Pumps - move your ankle up and down to promote circulation and to prevent blood clots ?Heel Slides - with leg out straight, slide your heel back to your hip bending your knee and then straighten again, bend and straight back and forth ? ?DO NOT prop anything under the knee, it will make your knee stiff.  Prop under the ankle to encourage your knee to go straight.  ? ?Use the walker while you are up and around for balance and for the first week try to keep some weight off the leg.  Wear your support stockings 24/7 to prevent blood clots and take baby aspirin twice daily for 30 days also to prevent blood clots ? ?Follow up with Dr Veverly Fells in two weeks in the office, call [phone] for appt  ?

## 2022-01-15 NOTE — Anesthesia Postprocedure Evaluation (Signed)
Anesthesia Post Note ? ?Patient: Nicole Melendez ? ?Procedure(s) Performed: ARTHROSCOPY KNEE (Left: Knee) ? ?  ? ?Patient location during evaluation: PACU ?Anesthesia Type: General ?Level of consciousness: awake and alert ?Pain management: pain level controlled ?Vital Signs Assessment: post-procedure vital signs reviewed and stable ?Respiratory status: spontaneous breathing, nonlabored ventilation, respiratory function stable and patient connected to nasal cannula oxygen ?Cardiovascular status: blood pressure returned to baseline and stable ?Postop Assessment: no apparent nausea or vomiting ?Anesthetic complications: no ? ? ?No notable events documented. ? ?Last Vitals:  ?Vitals:  ? 01/15/22 1315 01/15/22 1330  ?BP: (!) 112/93 116/72  ?Pulse: 75 78  ?Resp: 16 15  ?Temp:  36.9 ?C  ?SpO2: 100% 97%  ?  ?Last Pain:  ?Vitals:  ? 01/15/22 1330  ?PainSc: 3   ? ? ?  ?  ?  ?  ?  ?  ? ?[place] ? ? ? ? ?

## 2022-01-16 ENCOUNTER — Encounter (HOSPITAL_COMMUNITY): Payer: Self-pay | Admitting: Orthopedic Surgery

## 2022-02-12 ENCOUNTER — Encounter: Payer: Self-pay | Admitting: Internal Medicine

## 2022-02-12 ENCOUNTER — Ambulatory Visit (INDEPENDENT_AMBULATORY_CARE_PROVIDER_SITE_OTHER): Payer: Medicaid Other | Admitting: Internal Medicine

## 2022-02-12 VITALS — BP 138/90 | HR 90 | Temp 98.2°F | Ht 67.0 in | Wt 325.0 lb

## 2022-02-12 DIAGNOSIS — E1140 Type 2 diabetes mellitus with diabetic neuropathy, unspecified: Secondary | ICD-10-CM

## 2022-02-12 DIAGNOSIS — F411 Generalized anxiety disorder: Secondary | ICD-10-CM

## 2022-02-12 DIAGNOSIS — E559 Vitamin D deficiency, unspecified: Secondary | ICD-10-CM

## 2022-02-12 DIAGNOSIS — Z794 Long term (current) use of insulin: Secondary | ICD-10-CM

## 2022-02-12 DIAGNOSIS — I10 Essential (primary) hypertension: Secondary | ICD-10-CM

## 2022-02-12 DIAGNOSIS — J301 Allergic rhinitis due to pollen: Secondary | ICD-10-CM

## 2022-02-12 DIAGNOSIS — Z0001 Encounter for general adult medical examination with abnormal findings: Secondary | ICD-10-CM

## 2022-02-12 MED ORDER — ROSUVASTATIN CALCIUM 40 MG PO TABS: ORAL_TABLET | ORAL | 11 refills | Status: DC

## 2022-02-12 MED ORDER — TELMISARTAN-HCTZ 40-12.5 MG PO TABS: 1.0000 | ORAL_TABLET | Freq: Every day | ORAL | 11 refills | Status: DC

## 2022-02-12 MED ORDER — ALPRAZOLAM 0.5 MG PO TABS
ORAL_TABLET | ORAL | 5 refills | Status: DC
Start: 2022-02-12 — End: 2022-07-28

## 2022-02-12 MED ORDER — OZEMPIC (0.25 OR 0.5 MG/DOSE) 2 MG/1.5ML ~~LOC~~ SOPN
0.5000 mg | PEN_INJECTOR | SUBCUTANEOUS | 3 refills | Status: DC
Start: 2022-02-12 — End: 2022-12-22

## 2022-02-12 NOTE — Patient Instructions (Signed)
Please take all new medication as prescribed - the ozempic for sugar and weight loss ? ?Please continue all other medications as before, and refills have been done if requested. ? ?Please have the pharmacy call with any other refills you may need. ? ?Please continue your efforts at being more active, low cholesterol diet, and weight control. ? ?You are otherwise up to date with prevention measures today. ? ?Please keep your appointments with your specialists as you may have planned ? ?We can hold on further lab test today ? ?Please make an Appointment to return in 6 months, or sooner if needed ? ?

## 2022-02-12 NOTE — Progress Notes (Signed)
Patient ID: Nicole Melendez, female   DOB: 1976-07-23, 46 y.o.   MRN: [id number] ? ? ? ?     Chief Complaint:: wellness exam and dm, left knee injury, allergies, anxiety ? ?     HPI:  [name] is a 46 y.o. female here for wellness exam; plans to get eye exam herself soon; decliens covid booster, o.w up to date ?         ?              Also Denies worsening depressive symptoms, suicidal ideation, or panic; has ongoing anxiety, needs xanax refill.  Does have several wks ongoing nasal allergy symptoms with clearish congestion, itch and sneezing, without fever, pain, ST, cough, swelling or wheezing.  Also has left knee pain s/p surgury, still somewhat unstable, plans to f/u workmans comp.  Pt denies chest pain, increased sob or doe, wheezing, orthopnea, PND, increased LE swelling, palpitations, dizziness or syncope.  . Pt denies polydipsia, polyuria, or new focal neuro s/s.   Pt denies fever, wt loss, night sweats, loss of appetite, or other constitutional symptoms  Not taking vit d ?  ?Wt Readings from Last 3 Encounters:  ?02/12/22 (!) 325 lb (147.4 kg)  ?01/15/22 (!) 327 lb 6.4 oz (148.5 kg)  ?01/05/22 (!) 327 lb 6.4 oz (148.5 kg)  ? ?BP Readings from Last 3 Encounters:  ?02/12/22 138/90  ?01/15/22 116/72  ?01/05/22 (!) 157/93  ? ?Immunization History  ?Administered Date(s) Administered  ? Influenza Split 07/07/2011, 07/21/2012  ? Influenza,inj,Quad PF,6+ Mos 01/04/2014, 07/08/2014, 07/02/2015, 06/15/2017, 07/18/2018, 07/27/2019  ? Influenza-Unspecified 06/15/2017  ? PFIZER(Purple Top)SARS-COV-2 Vaccination 01/26/2020, 02/23/2020  ? Pneumococcal Conjugate-13 10/28/2015  ? Pneumococcal Polysaccharide-23 11/26/2015  ? Td 10/25/1997  ? Tdap 01/03/2013  ? ?There are no preventive care reminders to display for this patient. ? ?  ? ?Past Medical History:  ?Diagnosis Date  ? Allergic rhinitis, cause unspecified   ? Anemia   ? Anxiety   ? Arthritis   ? COPD (chronic obstructive pulmonary disease) ([place])   ? Depression   ?  Diabetes mellitus type II   ? steroid related, patient states "im no longer diabetic"  ? Essential hypertension 08/08/2015  ? GERD (gastroesophageal reflux disease)   ? History of blood transfusion   ? Hyperlipidemia   ? Menorrhagia   ? Migraine   ? Morbid obesity ([place])   ? Otitis media 06/28/2015  ? Peptic ulcer disease   ? Pneumonia   ? Positive ANA (antinuclear antibody) 02/14/2012  ? Sarcoid   ? including hand per rheumatology-Dr. Amil Amen  ? Sarcoidosis of lung ([place])   ? Shortness of breath   ? on exertion  ? Varicose veins with pain   ? ?Past Surgical History:  ?Procedure Laterality Date  ? ABDOMINAL HYSTERECTOMY    ? COLONOSCOPY WITH PROPOFOL N/A 10/19/2016  ? Procedure: COLONOSCOPY WITH PROPOFOL;  Surgeon: Mauri Pole, MD;  Location: [facility];  Service: Endoscopy;  Laterality: N/A;  ? ESOPHAGOGASTRODUODENOSCOPY (EGD) WITH PROPOFOL N/A 10/19/2016  ? Procedure: ESOPHAGOGASTRODUODENOSCOPY (EGD) WITH PROPOFOL;  Surgeon: Mauri Pole, MD;  Location: [facility];  Service: Endoscopy;  Laterality: N/A;  ? HERNIA MESH REMOVAL  02/2013  ? HERNIA REPAIR    ? KNEE ARTHROSCOPY Left 01/15/2022  ? Procedure: ARTHROSCOPY KNEE;  Surgeon: Netta Cedars, MD;  Location: [facility];  Service: Orthopedics;  Laterality: Left;  ? uterine ablation  03/2010  ? WISDOM TOOTH EXTRACTION    ? ? reports  that she quit smoking about 9 years ago. Her smoking use included cigarettes. She has a 20.00 pack-year smoking history. She has never used smokeless tobacco. She reports that she does not drink alcohol and does not use drugs. ?family history includes Allergies in her mother; Bone cancer in her maternal aunt; Breast cancer in her maternal aunt; Diabetes in her father and mother; Heart attack in her mother; Heart disease in her father; Hypertension in an other family member; Lung cancer in her maternal aunt; Ovarian cancer in her maternal aunt; Rheum arthritis in her father; Stroke in her father and maternal uncle. ?Allergies   ?Allergen Reactions  ? Azithromycin Hives  ?  (z pak) hives  ? Bee Venom Anaphylaxis  ? Flagyl [Metronidazole] Hives and Shortness Of Breath  ? Penicillins Shortness Of Breath and Swelling  ?  Has patient had a PCN reaction causing immediate rash, facial/tongue/throat swelling, SOB or lightheadedness with hypotension: Yes ?Has patient had a PCN reaction causing severe rash involving mucus membranes or skin necrosis: No ?Has patient had a PCN reaction that required hospitalization No ?Has patient had a PCN reaction occurring within the last 10 years: No ?If all of the above answers are "NO", then may proceed with Cephalosporin use. ? ?REACTION: swelling and difficulty breathing  ? Shellfish Allergy Anaphylaxis  ? Klonopin [Clonazepam] Other (See Comments)  ?  Memory difficulty  ? ?Current Outpatient Medications on File Prior to Visit  ?Medication Sig Dispense Refill  ? acetaminophen (TYLENOL) 650 MG CR tablet Take 650-1,300 mg by mouth every 8 (eight) hours as needed for pain.    ? albuterol (VENTOLIN HFA) 108 (90 Base) MCG/ACT inhaler INHALE 2 PUFFS INTO THE LUNGS EVERY 4 HOURS AS NEEDED FOR WHEEZING OR SHORTNESS OF BREATH 8.5 g 0  ? azelastine (ASTELIN) 0.1 % nasal spray Place 2 sprays into both nostrils 2 (two) times daily. Use in each nostril as directed 30 mL 12  ? baclofen (LIORESAL) 10 MG tablet TAKE 1 TO 2 TABLETS(10 TO 20 MG) BY MOUTH AT BEDTIME AS NEEDED FOR MUSCLE SPASMS 60 tablet 0  ? BD PEN NEEDLE NANO 2ND GEN 32G X 4 MM MISC USE AS DIRECTED THREE TIMES DAILY 300 each 3  ? benazepril-hydrochlorthiazide (LOTENSIN HCT) 20-25 MG tablet Take 1 tablet by mouth daily.    ? Blood Glucose Monitoring Suppl (ONETOUCH VERIO) w/Device KIT Use as directed daily E11.9 1 kit 0  ? gabapentin (NEURONTIN) 300 MG capsule 3 tab by mouth three times daily 270 capsule 5  ? glucose blood (ONETOUCH VERIO) test strip USE AS DIRECTED THREE TIMES DAILY 100 strip 11  ? insulin aspart (NOVOLOG FLEXPEN) 100 UNIT/ML FlexPen  ADMINISTER 12 UNITS UNDER THE SKIN FOUR TIMES DAILY 15 mL 2  ? levalbuterol (XOPENEX HFA) 45 MCG/ACT inhaler Inhale 1-2 puffs into the lungs every 8 (eight) hours as needed for wheezing or shortness of breath. USE 2 PUFFS 3 TIMES DAILY X 5 DAYS THEN BACK TO HOME REGIMEN AS NEEDED. 1 each 0  ? mometasone-formoterol (DULERA) 200-5 MCG/ACT AERO Inhale 2 puffs into the lungs in the morning and at bedtime. 8.8 g 5  ? nystatin (MYCOSTATIN) 100000 UNIT/ML suspension TAKE 5 MLS BY MOUTH 4(FOUR) TIMES DAILY. SWISH FOR 30 SECONDS AND SWALLOW 180 mL 0  ? omeprazole (PRILOSEC) 40 MG capsule TAKE 1 CAPSULE(40 MG) BY MOUTH DAILY (Patient taking differently: 80 mg daily.) 90 capsule 3  ? ondansetron (ZOFRAN ODT) 4 MG disintegrating tablet Take 1 tablet (4 mg total)  by mouth every 8 (eight) hours as needed for nausea or vomiting. 30 tablet 1  ? oxyCODONE-acetaminophen (PERCOCET) 10-325 MG tablet Take 1 tablet by mouth every 4 (four) hours as needed for pain. 30 tablet 0  ? rizatriptan (MAXALT-MLT) 10 MG disintegrating tablet Take 1 tablet earliest onset of migraine.  May repeat in 2 hours if needed.  Maximum 2 tablets in 24 hours 9 tablet 11  ? sertraline (ZOLOFT) 100 MG tablet TAKE 2 TABLETS(200 MG) BY MOUTH DAILY 180 tablet 3  ? TURMERIC PO Take 1 tablet by mouth daily.    ? vitamin B-12 (CYANOCOBALAMIN) 1000 MCG tablet Take 1,000 mcg by mouth daily.    ? zonisamide (ZONEGRAN) 100 MG capsule Take 2 capsules (200 mg total) by mouth daily. 60 capsule 11  ? Tiotropium Bromide Monohydrate (SPIRIVA RESPIMAT) 1.25 MCG/ACT AERS Inhale 2 puffs into the lungs daily. (Patient not taking: Reported on 02/12/2022) 4 g 0  ? ?No current facility-administered medications on file prior to visit.  ? ?     ROS:  All others reviewed and negative. ? ?Objective  ? ?     PE:  BP 138/90 (BP Location: Right Arm, Patient Position: Sitting, Cuff Size: Large)   Pulse 90   Temp 98.2 ?F (36.8 ?C) (Oral)   Ht '5\' 7"'  (1.702 m)   Wt (!) 325 lb (147.4 kg)    LMP 07/17/2012   SpO2 97%   BMI 50.90 kg/m?  ? ?              Constitutional: Pt appears in NAD ?              HENT: Head: NCAT.  ?              Right Ear: External ear normal.   ?              Left Ear: External ear

## 2022-02-12 NOTE — Assessment & Plan Note (Signed)
Last vitamin D Lab Results  Component Value Date   VD25OH 23.66 (L) 10/15/2020   Low, to start oral replacement  

## 2022-02-16 ENCOUNTER — Encounter: Payer: Self-pay | Admitting: Internal Medicine

## 2022-02-16 NOTE — Assessment & Plan Note (Signed)
Age and sex appropriate education and counseling updated with regular exercise and diet ?Referrals for preventative services - plans to call for optho exam herself ?Immunizations addressed - declines covid booster ?Smoking counseling  - none needed ?Evidence for depression or other mood disorder - chronic anxiety no change, declines need for counseling ?Most recent labs reviewed. ?I have personally reviewed and have noted: ?1) the patient's medical and social history ?2) The patient's current medications and supplements ?3) The patient's height, weight, and BMI have been recorded in the chart ? ?

## 2022-02-16 NOTE — Assessment & Plan Note (Signed)
Mild to mod, for otc nasacort restart,  to f/u any worsening symptoms or concerns ?

## 2022-02-16 NOTE — Assessment & Plan Note (Signed)
Lab Results  ?Component Value Date  ? HGBA1C 6.8 (H) 01/05/2022  ? ?Mild uncontrolled, pt to continue current medical treatment novolog, but add ozempic low dose for sugar and wt control ? ?

## 2022-02-16 NOTE — Assessment & Plan Note (Signed)
Stable, for xanax refill, decliens need for counseling referral ?

## 2022-02-16 NOTE — Assessment & Plan Note (Signed)
BP Readings from Last 3 Encounters:  ?02/12/22 138/90  ?01/15/22 116/72  ?01/05/22 (!) 157/93  ? ?Stable, pt to continue medical treatment lotensin hct ? ?

## 2022-02-28 ENCOUNTER — Encounter: Payer: Self-pay | Admitting: Internal Medicine

## 2022-03-01 MED ORDER — SERTRALINE HCL 100 MG PO TABS: 100.0000 mg | ORAL_TABLET | Freq: Two times a day (BID) | ORAL | 11 refills | Status: DC

## 2022-03-11 ENCOUNTER — Other Ambulatory Visit: Payer: Self-pay

## 2022-03-11 MED ORDER — GABAPENTIN 300 MG PO CAPS: ORAL_CAPSULE | ORAL | 5 refills | Status: DC

## 2022-03-15 DIAGNOSIS — M542 Cervicalgia: Secondary | ICD-10-CM | POA: Diagnosis not present

## 2022-03-15 DIAGNOSIS — M5136 Other intervertebral disc degeneration, lumbar region: Secondary | ICD-10-CM | POA: Diagnosis not present

## 2022-03-15 DIAGNOSIS — M5030 Other cervical disc degeneration, unspecified cervical region: Secondary | ICD-10-CM | POA: Diagnosis not present

## 2022-03-15 DIAGNOSIS — Z79899 Other long term (current) drug therapy: Secondary | ICD-10-CM | POA: Diagnosis not present

## 2022-03-15 DIAGNOSIS — M129 Arthropathy, unspecified: Secondary | ICD-10-CM | POA: Diagnosis not present

## 2022-03-15 DIAGNOSIS — M25551 Pain in right hip: Secondary | ICD-10-CM | POA: Diagnosis not present

## 2022-03-15 DIAGNOSIS — M5440 Lumbago with sciatica, unspecified side: Secondary | ICD-10-CM | POA: Diagnosis not present

## 2022-03-15 DIAGNOSIS — G8929 Other chronic pain: Secondary | ICD-10-CM | POA: Diagnosis not present

## 2022-03-16 ENCOUNTER — Encounter: Payer: Self-pay | Admitting: Internal Medicine

## 2022-03-16 MED ORDER — GABAPENTIN 300 MG PO CAPS: ORAL_CAPSULE | ORAL | 2 refills | Status: DC

## 2022-05-20 ENCOUNTER — Other Ambulatory Visit: Payer: Self-pay | Admitting: Family Medicine

## 2022-05-21 MED ORDER — BACLOFEN 10 MG PO TABS
10.0000 mg | ORAL_TABLET | Freq: Every evening | ORAL | 3 refills | Status: DC | PRN
Start: 2022-05-21 — End: 2022-12-27

## 2022-06-02 ENCOUNTER — Encounter (INDEPENDENT_AMBULATORY_CARE_PROVIDER_SITE_OTHER): Payer: Self-pay

## 2022-07-28 ENCOUNTER — Encounter: Payer: Self-pay | Admitting: Internal Medicine

## 2022-07-28 MED ORDER — ALPRAZOLAM 0.5 MG PO TABS: ORAL_TABLET | ORAL | 5 refills | Status: DC

## 2022-07-28 NOTE — Telephone Encounter (Signed)
LOV 02/12/22

## 2022-09-06 ENCOUNTER — Telehealth (INDEPENDENT_AMBULATORY_CARE_PROVIDER_SITE_OTHER): Payer: Self-pay | Admitting: Internal Medicine

## 2022-09-06 ENCOUNTER — Encounter: Payer: Self-pay | Admitting: Internal Medicine

## 2022-09-06 DIAGNOSIS — J069 Acute upper respiratory infection, unspecified: Secondary | ICD-10-CM

## 2022-09-06 DIAGNOSIS — J301 Allergic rhinitis due to pollen: Secondary | ICD-10-CM

## 2022-09-06 DIAGNOSIS — E559 Vitamin D deficiency, unspecified: Secondary | ICD-10-CM

## 2022-09-06 DIAGNOSIS — Z794 Long term (current) use of insulin: Secondary | ICD-10-CM

## 2022-09-06 DIAGNOSIS — E1140 Type 2 diabetes mellitus with diabetic neuropathy, unspecified: Secondary | ICD-10-CM

## 2022-09-06 MED ORDER — HYDROCODONE BIT-HOMATROP MBR 5-1.5 MG/5ML PO SOLN: 5.0000 mL | Freq: Four times a day (QID) | ORAL | 0 refills | Status: AC | PRN

## 2022-09-06 MED ORDER — PREDNISONE 10 MG PO TABS: ORAL_TABLET | ORAL | 0 refills | Status: DC

## 2022-09-06 MED ORDER — DOXYCYCLINE HYCLATE 100 MG PO TABS: 100.0000 mg | ORAL_TABLET | Freq: Two times a day (BID) | ORAL | 0 refills | Status: DC

## 2022-09-06 NOTE — Assessment & Plan Note (Addendum)
Mild to mod, for antibx course doxycycline 100 bid,, cough med prn, work note given,  to f/u any worsening symptoms or concerns

## 2022-09-06 NOTE — Patient Instructions (Signed)
Please take all new medication as prescribed 

## 2022-09-06 NOTE — Assessment & Plan Note (Signed)
Mild to mod, likely seasonal flare, for prednisone taper course,  to f/u any worsening symptoms or concerns

## 2022-09-06 NOTE — Assessment & Plan Note (Signed)
Last vitamin D Lab Results  Component Value Date   VD25OH 23.66 (L) 10/15/2020   Low, reminded to start oral replacement

## 2022-09-06 NOTE — Progress Notes (Signed)
Patient ID: Nicole Melendez, female   DOB: 1975-11-13, 46 y.o.   MRN: [id number]  Virtual Visit via Video Note  I connected with Nicole Melendez on 09/06/22 at 10:40 AM EST by a video enabled telemedicine application and verified that I am speaking with the correct person using two identifiers.  Location of all participants today Patient: at home Provider: at office   I discussed the limitations of evaluation and management by telemedicine and the availability of in person appointments. The patient expressed understanding and agreed to proceed.  History of Present Illness:  Here with 2-3 days acute onset fever, facial pain, pressure, headache, general weakness and malaise, and greenish d/c, with mild ST and cough, but pt denies chest pain, wheezing, increased sob or doe, orthopnea, PND, increased LE swelling, palpitations, dizziness or syncope.  Also, Does have several wks ongoing nasal allergy symptoms with clearish congestion, itch and sneezing, without fever, pain, ST, cough, swelling or wheezing.   Pt denies polydipsia, polyuria, or new focal neuro s/s.  Pt tested home COVID negative yesterday Past Medical History:  Diagnosis Date   Allergic rhinitis, cause unspecified    Anemia    Anxiety    Arthritis    COPD (chronic obstructive pulmonary disease) ([place])    Depression    Diabetes mellitus type II    steroid related, patient states "im no longer diabetic"   Essential hypertension 08/08/2015   GERD (gastroesophageal reflux disease)    History of blood transfusion    Hyperlipidemia    Menorrhagia    Migraine    Morbid obesity ([place])    Otitis media 06/28/2015   Peptic ulcer disease    Pneumonia    Positive ANA (antinuclear antibody) 02/14/2012   Sarcoid    including hand per rheumatology-Dr. 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([place])    Shortness of breath    on exertion   Varicose veins with pain    Past Surgical History:  Procedure Laterality Date   ABDOMINAL HYSTERECTOMY      COLONOSCOPY WITH PROPOFOL N/A 10/19/2016   Procedure: COLONOSCOPY WITH PROPOFOL;  Surgeon: Mauri Pole, MD;  Location: [facility];  Service: Endoscopy;  Laterality: N/A;   ESOPHAGOGASTRODUODENOSCOPY (EGD) WITH PROPOFOL N/A 10/19/2016   Procedure: ESOPHAGOGASTRODUODENOSCOPY (EGD) WITH PROPOFOL;  Surgeon: Mauri Pole, MD;  Location: [facility];  Service: Endoscopy;  Laterality: N/A;   HERNIA MESH REMOVAL  02/2013   HERNIA REPAIR     KNEE ARTHROSCOPY Left 01/15/2022   Procedure: ARTHROSCOPY KNEE;  Surgeon: Netta Cedars, MD;  Location: [facility];  Service: Orthopedics;  Laterality: Left;   uterine ablation  03/2010   WISDOM TOOTH EXTRACTION      reports that she quit smoking about 9 years ago. Her smoking use included cigarettes. She has a 20.00 pack-year smoking history. She has never used smokeless tobacco. She reports that she does not drink alcohol and does not use drugs. family history includes Allergies in her mother; Bone cancer in her maternal aunt; Breast cancer in her maternal aunt; Diabetes in her father and mother; Heart attack in her mother; Heart disease in her father; Hypertension in an other family member; Lung cancer in her maternal aunt; Ovarian cancer in her maternal aunt; Rheum arthritis in her father; Stroke in her father and maternal uncle. Allergies  Allergen Reactions   Azithromycin Hives    (z pak) hives   Bee Venom Anaphylaxis   Flagyl [Metronidazole] Hives and Shortness Of Breath   Penicillins Shortness  Of Breath and Swelling    Has patient had a PCN reaction causing immediate rash, facial/tongue/throat swelling, SOB or lightheadedness with hypotension: Yes Has patient had a PCN reaction causing severe rash involving mucus membranes or skin necrosis: No Has patient had a PCN reaction that required hospitalization No Has patient had a PCN reaction occurring within the last 10 years: No If all of the above answers are "NO", then may proceed with  Cephalosporin use.  REACTION: swelling and difficulty breathing   Shellfish Allergy Anaphylaxis   Klonopin [Clonazepam] Other (See Comments)    Memory difficulty   Current Outpatient Medications on File Prior to Visit  Medication Sig Dispense Refill   acetaminophen (TYLENOL) 650 MG CR tablet Take 650-1,300 mg by mouth every 8 (eight) hours as needed for pain.     albuterol (VENTOLIN HFA) 108 (90 Base) MCG/ACT inhaler INHALE 2 PUFFS INTO THE LUNGS EVERY 4 HOURS AS NEEDED FOR WHEEZING OR SHORTNESS OF BREATH 8.5 g 0   ALPRAZolam (XANAX) 0.5 MG tablet 1 tab by mouth three times daily as needed 90 tablet 5   azelastine (ASTELIN) 0.1 % nasal spray Place 2 sprays into both nostrils 2 (two) times daily. Use in each nostril as directed 30 mL 12   baclofen (LIORESAL) 10 MG tablet Take 1-2 tablets (10-20 mg total) by mouth at bedtime as needed for muscle spasms. 60 tablet 3   BD PEN NEEDLE NANO 2ND GEN 32G X 4 MM MISC USE AS DIRECTED THREE TIMES DAILY 300 each 3   benazepril-hydrochlorthiazide (LOTENSIN HCT) 20-25 MG tablet Take 1 tablet by mouth daily.     Blood Glucose Monitoring Suppl (ONETOUCH VERIO) w/Device KIT Use as directed daily E11.9 1 kit 0   gabapentin (NEURONTIN) 300 MG capsule 3 tab by mouth three times daily 270 capsule 2   glucose blood (ONETOUCH VERIO) test strip USE AS DIRECTED THREE TIMES DAILY 100 strip 11   insulin aspart (NOVOLOG FLEXPEN) 100 UNIT/ML FlexPen ADMINISTER 12 UNITS UNDER THE SKIN FOUR TIMES DAILY 15 mL 2   levalbuterol (XOPENEX HFA) 45 MCG/ACT inhaler Inhale 1-2 puffs into the lungs every 8 (eight) hours as needed for wheezing or shortness of breath. USE 2 PUFFS 3 TIMES DAILY X 5 DAYS THEN BACK TO HOME REGIMEN AS NEEDED. 1 each 0   mometasone-formoterol (DULERA) 200-5 MCG/ACT AERO Inhale 2 puffs into the lungs in the morning and at bedtime. 8.8 g 5   nystatin (MYCOSTATIN) 100000 UNIT/ML suspension TAKE 5 MLS BY MOUTH 4(FOUR) TIMES DAILY. SWISH FOR 30 SECONDS AND SWALLOW  180 mL 0   omeprazole (PRILOSEC) 40 MG capsule TAKE 1 CAPSULE(40 MG) BY MOUTH DAILY (Patient taking differently: 80 mg daily.) 90 capsule 3   ondansetron (ZOFRAN ODT) 4 MG disintegrating tablet Take 1 tablet (4 mg total) by mouth every 8 (eight) hours as needed for nausea or vomiting. 30 tablet 1   oxyCODONE-acetaminophen (PERCOCET) 10-325 MG tablet Take 1 tablet by mouth every 4 (four) hours as needed for pain. 30 tablet 0   rizatriptan (MAXALT-MLT) 10 MG disintegrating tablet Take 1 tablet earliest onset of migraine.  May repeat in 2 hours if needed.  Maximum 2 tablets in 24 hours 9 tablet 11   rosuvastatin (CRESTOR) 40 MG tablet 1 tab by mouth once daily 30 tablet 11   Semaglutide,0.25 or 0.5MG/DOS, (OZEMPIC, 0.25 OR 0.5 MG/DOSE,) 2 MG/1.5ML SOPN Inject 0.5 mg into the skin once a week. 6 mL 3   sertraline (ZOLOFT) 100 MG tablet  Take 1 tablet (100 mg total) by mouth 2 (two) times daily. 60 tablet 11   telmisartan-hydrochlorothiazide (MICARDIS HCT) 40-12.5 MG tablet Take 1 tablet by mouth daily. 30 tablet 11   Tiotropium Bromide Monohydrate (SPIRIVA RESPIMAT) 1.25 MCG/ACT AERS Inhale 2 puffs into the lungs daily. (Patient not taking: Reported on 02/12/2022) 4 g 0   TURMERIC PO Take 1 tablet by mouth daily.     vitamin B-12 (CYANOCOBALAMIN) 1000 MCG tablet Take 1,000 mcg by mouth daily.     zonisamide (ZONEGRAN) 100 MG capsule Take 2 capsules (200 mg total) by mouth daily. 60 capsule 11   No current facility-administered medications on file prior to visit.    Observations/Objective: Alert, NAD, appropriate mood and affect, resps normal, cn 2-12 intact, moves all 4s, no visible rash or swelling Lab Results  Component Value Date   WBC 7.6 01/05/2022   HGB 13.3 01/05/2022   HCT 40.9 01/05/2022   PLT 296 01/05/2022   GLUCOSE 165 (H) 01/05/2022   CHOL 151 03/26/2021   TRIG 75.0 03/26/2021   HDL 60.50 03/26/2021   LDLDIRECT 135.0 07/05/2011   LDLCALC 76 03/26/2021   ALT 11 03/26/2021   AST  13 03/26/2021   NA 139 01/05/2022   K 3.5 01/05/2022   CL 106 01/05/2022   CREATININE 0.64 01/05/2022   BUN 11 01/05/2022   CO2 24 01/05/2022   TSH 1.60 10/15/2020   INR 1.3 11/17/2012   HGBA1C 6.8 (H) 01/05/2022   MICROALBUR 0.7 10/15/2020   Assessment and Plan: See notes  Follow Up Instructions: See notes   I discussed the assessment and treatment plan with the patient. The patient was provided an opportunity to ask questions and all were answered. The patient agreed with the plan and demonstrated an understanding of the instructions.   The patient was advised to call back or seek an in-person evaluation if the symptoms worsen or if the condition fails to improve as anticipated.   Cathlean Cower, MD

## 2022-09-06 NOTE — Assessment & Plan Note (Addendum)
Lab Results  Component Value Date   HGBA1C 6.8 (H) 01/05/2022   Stable by hx, pt to continue current medical treatment novolog, ozempic; will have A1c 6 mo repeat with next labs

## 2022-09-10 DIAGNOSIS — M129 Arthropathy, unspecified: Secondary | ICD-10-CM | POA: Diagnosis not present

## 2022-09-22 ENCOUNTER — Encounter: Payer: Self-pay | Admitting: Internal Medicine

## 2022-09-22 MED ORDER — ALPRAZOLAM 0.5 MG PO TABS: ORAL_TABLET | ORAL | 5 refills | Status: DC

## 2022-09-24 DIAGNOSIS — Z79899 Other long term (current) drug therapy: Secondary | ICD-10-CM | POA: Diagnosis not present

## 2022-10-08 ENCOUNTER — Telehealth: Payer: Self-pay | Admitting: Emergency Medicine

## 2022-10-08 NOTE — Telephone Encounter (Signed)
Patient is returning phone call, Patient phone number is [phone].

## 2022-10-08 NOTE — Telephone Encounter (Signed)
ATC patient.  LM to call back. 

## 2022-10-08 NOTE — Telephone Encounter (Signed)
Called patient but she did not answer. Left message for her to call back.  

## 2022-10-11 DIAGNOSIS — G8929 Other chronic pain: Secondary | ICD-10-CM | POA: Diagnosis not present

## 2022-10-11 DIAGNOSIS — Z6841 Body Mass Index (BMI) 40.0 and over, adult: Secondary | ICD-10-CM | POA: Diagnosis not present

## 2022-10-11 DIAGNOSIS — M7061 Trochanteric bursitis, right hip: Secondary | ICD-10-CM | POA: Diagnosis not present

## 2022-10-11 DIAGNOSIS — M539 Dorsopathy, unspecified: Secondary | ICD-10-CM | POA: Diagnosis not present

## 2022-10-11 DIAGNOSIS — M5136 Other intervertebral disc degeneration, lumbar region: Secondary | ICD-10-CM | POA: Diagnosis not present

## 2022-10-11 DIAGNOSIS — M542 Cervicalgia: Secondary | ICD-10-CM | POA: Diagnosis not present

## 2022-10-11 DIAGNOSIS — F411 Generalized anxiety disorder: Secondary | ICD-10-CM | POA: Diagnosis not present

## 2022-10-11 DIAGNOSIS — R2689 Other abnormalities of gait and mobility: Secondary | ICD-10-CM | POA: Diagnosis not present

## 2022-10-11 DIAGNOSIS — F410 Panic disorder [episodic paroxysmal anxiety] without agoraphobia: Secondary | ICD-10-CM | POA: Diagnosis not present

## 2022-10-11 DIAGNOSIS — M5030 Other cervical disc degeneration, unspecified cervical region: Secondary | ICD-10-CM | POA: Diagnosis not present

## 2022-10-11 DIAGNOSIS — M5440 Lumbago with sciatica, unspecified side: Secondary | ICD-10-CM | POA: Diagnosis not present

## 2022-10-11 DIAGNOSIS — M25551 Pain in right hip: Secondary | ICD-10-CM | POA: Diagnosis not present

## 2022-10-26 ENCOUNTER — Encounter: Payer: Self-pay | Admitting: Internal Medicine

## 2022-10-26 ENCOUNTER — Other Ambulatory Visit: Payer: Self-pay | Admitting: Internal Medicine

## 2022-10-26 ENCOUNTER — Telehealth: Payer: Medicaid Other | Admitting: Internal Medicine

## 2022-10-26 DIAGNOSIS — R051 Acute cough: Secondary | ICD-10-CM | POA: Diagnosis not present

## 2022-10-26 DIAGNOSIS — B3731 Acute candidiasis of vulva and vagina: Secondary | ICD-10-CM | POA: Diagnosis not present

## 2022-10-26 DIAGNOSIS — E559 Vitamin D deficiency, unspecified: Secondary | ICD-10-CM

## 2022-10-26 DIAGNOSIS — R062 Wheezing: Secondary | ICD-10-CM

## 2022-10-26 MED ORDER — HYDROCODONE BIT-HOMATROP MBR 5-1.5 MG/5ML PO SOLN: 5.0000 mL | Freq: Four times a day (QID) | ORAL | 0 refills | Status: AC | PRN

## 2022-10-26 MED ORDER — LEVOFLOXACIN 500 MG PO TABS: 500.0000 mg | ORAL_TABLET | Freq: Every day | ORAL | 0 refills | Status: AC

## 2022-10-26 MED ORDER — PREDNISONE 10 MG PO TABS: ORAL_TABLET | ORAL | 0 refills | Status: DC

## 2022-10-26 MED ORDER — FLUCONAZOLE 150 MG PO TABS: ORAL_TABLET | ORAL | 1 refills | Status: DC

## 2022-10-26 NOTE — Patient Instructions (Signed)
Please take all new medication as prescribed - the antibiotic, cough medicine, diflucan for yeast, and prednisone  You are given the work note  Please continue all other medications as before, and refills have been done if requested.  Please have the pharmacy call with any other refills you may need.  Please keep your appointments with your specialists as you may have planned

## 2022-10-26 NOTE — Assessment & Plan Note (Signed)
Ok for diflucan 150 mg prn asd- done erx

## 2022-10-26 NOTE — Progress Notes (Signed)
Patient ID: Nicole Melendez, female   DOB: 1976/05/29, 47 y.o.   MRN: [id number]  Virtual Visit via Video Note  I connected with Nicole Melendez on 10/26/22 at  2:20 PM EST by a video enabled telemedicine application and verified that I am speaking with the correct person using two identifiers.  Location of all participants today Patient: at home Provider: at office   I discussed the limitations of evaluation and management by telemedicine and the availability of in person appointments. The patient expressed understanding and agreed to proceed.  History of Present Illness: Here with acute onset mild to mod 2-3 days ST, HA, general weakness and malaise, with prod cough greenish sputum, but Pt denies chest pain, increased sob or doe, wheezing, orthopnea, PND, increased LE swelling, palpitations, dizziness or syncope, except for onset wheezing, sob and increased dulera use x 2 days.   Pt denies polydipsia, polyuria, or new focal neuro s/s.    Pt denies denies wt loss, night sweats, loss of appetite, or other constitutional symptoms   Past Medical History:  Diagnosis Date   Allergic rhinitis, cause unspecified    Anemia    Anxiety    Arthritis    COPD (chronic obstructive pulmonary disease) ([facility])    Depression    Diabetes mellitus type II    steroid related, patient states "im no longer diabetic"   Essential hypertension 08/08/2015   GERD (gastroesophageal reflux disease)    History of blood transfusion    Hyperlipidemia    Menorrhagia    Migraine    Morbid obesity ([place])    Otitis media 06/28/2015   Peptic ulcer disease    Pneumonia    Positive ANA (antinuclear antibody) 02/14/2012   Sarcoid    including hand per rheumatology-Dr. 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([place])    Shortness of breath    on exertion   Varicose veins with pain    Past Surgical History:  Procedure Laterality Date   ABDOMINAL HYSTERECTOMY     COLONOSCOPY WITH PROPOFOL N/A 10/19/2016   Procedure: COLONOSCOPY  WITH PROPOFOL;  Surgeon: Nicole Melendez;  Location: [facility];  Service: Endoscopy;  Laterality: N/A;   ESOPHAGOGASTRODUODENOSCOPY (EGD) WITH PROPOFOL N/A 10/19/2016   Procedure: ESOPHAGOGASTRODUODENOSCOPY (EGD) WITH PROPOFOL;  Surgeon: Nicole Melendez;  Location: [facility];  Service: Endoscopy;  Laterality: N/A;   HERNIA MESH REMOVAL  02/2013   HERNIA REPAIR     KNEE ARTHROSCOPY Left 01/15/2022   Procedure: ARTHROSCOPY KNEE;  Surgeon: Nicole Melendez;  Location: [facility];  Service: Orthopedics;  Laterality: Left;   uterine ablation  03/2010   WISDOM TOOTH EXTRACTION      reports that she quit smoking about 10 years ago. Her smoking use included cigarettes. She has a 20.00 pack-year smoking history. She has never used smokeless tobacco. She reports that she does not drink alcohol and does not use drugs. family history includes Allergies in her mother; Bone cancer in her maternal aunt; Breast cancer in her maternal aunt; Diabetes in her father and mother; Heart attack in her mother; Heart disease in her father; Hypertension in an other family member; Lung cancer in her maternal aunt; Ovarian cancer in her maternal aunt; Rheum arthritis in her father; Stroke in her father and maternal uncle. Allergies  Allergen Reactions   Azithromycin Hives    (z pak) hives   Bee Venom Anaphylaxis   Flagyl [Metronidazole] Hives and Shortness Of Breath   Penicillins Shortness Of Breath and  Swelling    Has patient had a PCN reaction causing immediate rash, facial/tongue/throat swelling, SOB or lightheadedness with hypotension: Yes Has patient had a PCN reaction causing severe rash involving mucus membranes or skin necrosis: No Has patient had a PCN reaction that required hospitalization No Has patient had a PCN reaction occurring within the last 10 years: No If all of the above answers are "NO", then may proceed with Cephalosporin use.  REACTION: swelling and difficulty breathing    Shellfish Allergy Anaphylaxis   Klonopin [Clonazepam] Other (See Comments)    Memory difficulty   Current Outpatient Medications on File Prior to Visit  Medication Sig Dispense Refill   acetaminophen (TYLENOL) 650 MG CR tablet Take 650-1,300 mg by mouth every 8 (eight) hours as needed for pain.     albuterol (VENTOLIN HFA) 108 (90 Base) MCG/ACT inhaler INHALE 2 PUFFS INTO THE LUNGS EVERY 4 HOURS AS NEEDED FOR WHEEZING OR SHORTNESS OF BREATH 8.5 g 0   ALPRAZolam (XANAX) 0.5 MG tablet 1 tab by mouth once daily as needed 90 tablet 5   azelastine (ASTELIN) 0.1 % nasal spray Place 2 sprays into both nostrils 2 (two) times daily. Use in each nostril as directed 30 mL 12   baclofen (LIORESAL) 10 MG tablet Take 1-2 tablets (10-20 mg total) by mouth at bedtime as needed for muscle spasms. 60 tablet 3   BD PEN NEEDLE NANO 2ND GEN 32G X 4 MM MISC USE AS DIRECTED THREE TIMES DAILY 300 each 3   benazepril-hydrochlorthiazide (LOTENSIN HCT) 20-25 MG tablet Take 1 tablet by mouth daily.     Blood Glucose Monitoring Suppl (ONETOUCH VERIO) w/Device KIT Use as directed daily E11.9 1 kit 0   doxycycline (VIBRA-TABS) 100 MG tablet Take 1 tablet (100 mg total) by mouth 2 (two) times daily. 20 tablet 0   gabapentin (NEURONTIN) 300 MG capsule 3 tab by mouth three times daily 270 capsule 2   glucose blood (ONETOUCH VERIO) test strip USE AS DIRECTED THREE TIMES DAILY 100 strip 11   insulin aspart (NOVOLOG FLEXPEN) 100 UNIT/ML FlexPen ADMINISTER 12 UNITS UNDER THE SKIN FOUR TIMES DAILY 15 mL 2   levalbuterol (XOPENEX HFA) 45 MCG/ACT inhaler Inhale 1-2 puffs into the lungs every 8 (eight) hours as needed for wheezing or shortness of breath. USE 2 PUFFS 3 TIMES DAILY X 5 DAYS THEN BACK TO HOME REGIMEN AS NEEDED. 1 each 0   mometasone-formoterol (DULERA) 200-5 MCG/ACT AERO Inhale 2 puffs into the lungs in the morning and at bedtime. 8.8 g 5   nystatin (MYCOSTATIN) 100000 UNIT/ML suspension TAKE 5 MLS BY MOUTH 4(FOUR) TIMES  DAILY. SWISH FOR 30 SECONDS AND SWALLOW 180 mL 0   omeprazole (PRILOSEC) 40 MG capsule TAKE 1 CAPSULE(40 MG) BY MOUTH DAILY (Patient taking differently: 80 mg daily.) 90 capsule 3   ondansetron (ZOFRAN ODT) 4 MG disintegrating tablet Take 1 tablet (4 mg total) by mouth every 8 (eight) hours as needed for nausea or vomiting. 30 tablet 1   oxyCODONE-acetaminophen (PERCOCET) 10-325 MG tablet Take 1 tablet by mouth every 4 (four) hours as needed for pain. 30 tablet 0   rizatriptan (MAXALT-MLT) 10 MG disintegrating tablet Take 1 tablet earliest onset of migraine.  May repeat in 2 hours if needed.  Maximum 2 tablets in 24 hours 9 tablet 11   rosuvastatin (CRESTOR) 40 MG tablet 1 tab by mouth once daily 30 tablet 11   Semaglutide,0.25 or 0.5MG/DOS, (OZEMPIC, 0.25 OR 0.5 MG/DOSE,) 2 MG/1.5ML SOPN Inject  0.5 mg into the skin once a week. 6 mL 3   sertraline (ZOLOFT) 100 MG tablet Take 1 tablet (100 mg total) by mouth 2 (two) times daily. 60 tablet 11   telmisartan-hydrochlorothiazide (MICARDIS HCT) 40-12.5 MG tablet Take 1 tablet by mouth daily. 30 tablet 11   Tiotropium Bromide Monohydrate (SPIRIVA RESPIMAT) 1.25 MCG/ACT AERS Inhale 2 puffs into the lungs daily. (Patient not taking: Reported on 02/12/2022) 4 g 0   TURMERIC PO Take 1 tablet by mouth daily.     vitamin B-12 (CYANOCOBALAMIN) 1000 MCG tablet Take 1,000 mcg by mouth daily.     zonisamide (ZONEGRAN) 100 MG capsule Take 2 capsules (200 mg total) by mouth daily. 60 capsule 11   No current facility-administered medications on file prior to visit.   Observations/Objective: Alert, ill appearing, appropriate mood and affect, resps normal, but coughing with sob, cn 2-12 intact, moves all 4s, no visible rash or swelling Lab Results  Component Value Date   WBC 7.6 01/05/2022   HGB 13.3 01/05/2022   HCT 40.9 01/05/2022   PLT 296 01/05/2022   GLUCOSE 165 (H) 01/05/2022   CHOL 151 03/26/2021   TRIG 75.0 03/26/2021   HDL 60.50 03/26/2021   LDLDIRECT  135.0 07/05/2011   LDLCALC 76 03/26/2021   ALT 11 03/26/2021   AST 13 03/26/2021   NA 139 01/05/2022   K 3.5 01/05/2022   CL 106 01/05/2022   CREATININE 0.64 01/05/2022   BUN 11 01/05/2022   CO2 24 01/05/2022   TSH 1.60 10/15/2020   INR 1.3 11/17/2012   HGBA1C 6.8 (H) 01/05/2022   MICROALBUR 0.7 10/15/2020   Assessment and Plan: See notes  Follow Up Instructions: See notes   I discussed the assessment and treatment plan with the patient. The patient was provided an opportunity to ask questions and all were answered. The patient agreed with the plan and demonstrated an understanding of the instructions.   The patient was advised to call back or seek an in-person evaluation if the symptoms worsen or if the condition fails to improve as anticipated.  Cathlean Cower, Melendez

## 2022-10-26 NOTE — Assessment & Plan Note (Signed)
Mild to mod, c/w bronchospasm most likely, for prednisone taper, continue inhalers including dulera

## 2022-10-26 NOTE — Assessment & Plan Note (Signed)
Mild to mod, c/w bronchitis or pna, declines cxr for now, but for antibx course,  cough med prn, to f/u any worsening symptoms or concerns

## 2022-10-26 NOTE — Assessment & Plan Note (Signed)
Last vitamin D Lab Results  Component Value Date   VD25OH 23.66 (L) 10/15/2020   Low, to start oral replacement

## 2022-11-08 ENCOUNTER — Other Ambulatory Visit: Payer: Self-pay

## 2022-11-08 MED ORDER — TELMISARTAN-HCTZ 40-12.5 MG PO TABS: 1.0000 | ORAL_TABLET | Freq: Every day | ORAL | 2 refills | Status: DC

## 2022-11-17 DIAGNOSIS — M5136 Other intervertebral disc degeneration, lumbar region: Secondary | ICD-10-CM | POA: Diagnosis not present

## 2022-11-17 DIAGNOSIS — F411 Generalized anxiety disorder: Secondary | ICD-10-CM | POA: Diagnosis not present

## 2022-11-17 DIAGNOSIS — M542 Cervicalgia: Secondary | ICD-10-CM | POA: Diagnosis not present

## 2022-11-17 DIAGNOSIS — Z6841 Body Mass Index (BMI) 40.0 and over, adult: Secondary | ICD-10-CM | POA: Diagnosis not present

## 2022-11-17 DIAGNOSIS — M7061 Trochanteric bursitis, right hip: Secondary | ICD-10-CM | POA: Diagnosis not present

## 2022-11-17 DIAGNOSIS — E559 Vitamin D deficiency, unspecified: Secondary | ICD-10-CM | POA: Diagnosis not present

## 2022-11-17 DIAGNOSIS — M25551 Pain in right hip: Secondary | ICD-10-CM | POA: Diagnosis not present

## 2022-11-17 DIAGNOSIS — R2689 Other abnormalities of gait and mobility: Secondary | ICD-10-CM | POA: Diagnosis not present

## 2022-11-17 DIAGNOSIS — M539 Dorsopathy, unspecified: Secondary | ICD-10-CM | POA: Diagnosis not present

## 2022-11-17 DIAGNOSIS — E119 Type 2 diabetes mellitus without complications: Secondary | ICD-10-CM | POA: Diagnosis not present

## 2022-11-17 DIAGNOSIS — M5440 Lumbago with sciatica, unspecified side: Secondary | ICD-10-CM | POA: Diagnosis not present

## 2022-11-17 DIAGNOSIS — M5030 Other cervical disc degeneration, unspecified cervical region: Secondary | ICD-10-CM | POA: Diagnosis not present

## 2022-11-22 ENCOUNTER — Encounter: Payer: Self-pay | Admitting: Internal Medicine

## 2022-11-26 ENCOUNTER — Telehealth: Payer: Self-pay

## 2022-12-15 DIAGNOSIS — M539 Dorsopathy, unspecified: Secondary | ICD-10-CM | POA: Diagnosis not present

## 2022-12-15 DIAGNOSIS — M25551 Pain in right hip: Secondary | ICD-10-CM | POA: Diagnosis not present

## 2022-12-15 DIAGNOSIS — F411 Generalized anxiety disorder: Secondary | ICD-10-CM | POA: Diagnosis not present

## 2022-12-15 DIAGNOSIS — E559 Vitamin D deficiency, unspecified: Secondary | ICD-10-CM | POA: Diagnosis not present

## 2022-12-15 DIAGNOSIS — M542 Cervicalgia: Secondary | ICD-10-CM | POA: Diagnosis not present

## 2022-12-15 DIAGNOSIS — R2689 Other abnormalities of gait and mobility: Secondary | ICD-10-CM | POA: Diagnosis not present

## 2022-12-15 DIAGNOSIS — M5440 Lumbago with sciatica, unspecified side: Secondary | ICD-10-CM | POA: Diagnosis not present

## 2022-12-15 DIAGNOSIS — M5136 Other intervertebral disc degeneration, lumbar region: Secondary | ICD-10-CM | POA: Diagnosis not present

## 2022-12-15 DIAGNOSIS — Z79899 Other long term (current) drug therapy: Secondary | ICD-10-CM | POA: Diagnosis not present

## 2022-12-15 DIAGNOSIS — M5030 Other cervical disc degeneration, unspecified cervical region: Secondary | ICD-10-CM | POA: Diagnosis not present

## 2022-12-15 DIAGNOSIS — E119 Type 2 diabetes mellitus without complications: Secondary | ICD-10-CM | POA: Diagnosis not present

## 2022-12-15 DIAGNOSIS — M7061 Trochanteric bursitis, right hip: Secondary | ICD-10-CM | POA: Diagnosis not present

## 2022-12-15 DIAGNOSIS — Z6841 Body Mass Index (BMI) 40.0 and over, adult: Secondary | ICD-10-CM | POA: Diagnosis not present

## 2022-12-17 DIAGNOSIS — Z79899 Other long term (current) drug therapy: Secondary | ICD-10-CM | POA: Diagnosis not present

## 2022-12-22 ENCOUNTER — Ambulatory Visit: Payer: Medicaid Other | Admitting: Internal Medicine

## 2022-12-22 ENCOUNTER — Other Ambulatory Visit: Payer: Self-pay | Admitting: Internal Medicine

## 2022-12-22 ENCOUNTER — Encounter: Payer: Self-pay | Admitting: Internal Medicine

## 2022-12-22 ENCOUNTER — Telehealth: Payer: Self-pay | Admitting: Internal Medicine

## 2022-12-22 VITALS — BP 130/76 | HR 80 | Temp 98.1°F | Ht 67.0 in | Wt 324.0 lb

## 2022-12-22 DIAGNOSIS — L609 Nail disorder, unspecified: Secondary | ICD-10-CM | POA: Diagnosis not present

## 2022-12-22 DIAGNOSIS — Z794 Long term (current) use of insulin: Secondary | ICD-10-CM

## 2022-12-22 DIAGNOSIS — L03039 Cellulitis of unspecified toe: Secondary | ICD-10-CM

## 2022-12-22 DIAGNOSIS — F411 Generalized anxiety disorder: Secondary | ICD-10-CM

## 2022-12-22 DIAGNOSIS — I10 Essential (primary) hypertension: Secondary | ICD-10-CM | POA: Diagnosis not present

## 2022-12-22 DIAGNOSIS — E559 Vitamin D deficiency, unspecified: Secondary | ICD-10-CM

## 2022-12-22 DIAGNOSIS — E538 Deficiency of other specified B group vitamins: Secondary | ICD-10-CM

## 2022-12-22 DIAGNOSIS — E1140 Type 2 diabetes mellitus with diabetic neuropathy, unspecified: Secondary | ICD-10-CM

## 2022-12-22 DIAGNOSIS — Z0001 Encounter for general adult medical examination with abnormal findings: Secondary | ICD-10-CM | POA: Diagnosis not present

## 2022-12-22 LAB — MICROALBUMIN / CREATININE URINE RATIO
Creatinine,U: 80.4 mg/dL
Microalb Creat Ratio: 0.9 mg/g (ref 0.0–30.0)
Microalb, Ur: <0.7 mg/dL (ref 0.0–1.9)

## 2022-12-22 LAB — URINALYSIS, ROUTINE W REFLEX MICROSCOPIC
Bilirubin Urine: NEGATIVE
Ketones, ur: NEGATIVE
Leukocytes,Ua: NEGATIVE
Nitrite: NEGATIVE
Specific Gravity, Urine: 1.015 (ref 1.000–1.030)
Total Protein, Urine: NEGATIVE
Urine Glucose: NEGATIVE
Urobilinogen, UA: 1 (ref 0.0–1.0)
pH: 6 (ref 5.0–8.0)

## 2022-12-22 LAB — VITAMIN D 25 HYDROXY (VIT D DEFICIENCY, FRACTURES): VITD: 14.37 ng/mL — ABNORMAL LOW (ref 30.00–100.00)

## 2022-12-22 LAB — HEPATIC FUNCTION PANEL
ALT: 8 U/L (ref 0–35)
AST: 10 U/L (ref 0–37)
Albumin: 3.9 g/dL (ref 3.5–5.2)
Alkaline Phosphatase: 131 U/L — ABNORMAL HIGH (ref 39–117)
Bilirubin, Direct: 0.2 mg/dL (ref 0.0–0.3)
Total Bilirubin: 0.4 mg/dL (ref 0.2–1.2)
Total Protein: 7.3 g/dL (ref 6.0–8.3)

## 2022-12-22 LAB — BASIC METABOLIC PANEL
BUN: 9 mg/dL (ref 6–23)
CO2: 25 mEq/L (ref 19–32)
Calcium: 10 mg/dL (ref 8.4–10.5)
Chloride: 106 mEq/L (ref 96–112)
Creatinine, Ser: 0.7 mg/dL (ref 0.40–1.20)
GFR: 101.88 mL/min (ref >60.00)
Glucose, Bld: 113 mg/dL — ABNORMAL HIGH (ref 70–99)
Potassium: 4 mEq/L (ref 3.5–5.1)
Sodium: 139 mEq/L (ref 135–145)

## 2022-12-22 LAB — CBC WITH DIFFERENTIAL/PLATELET
Basophils Absolute: 0.1 10*3/uL (ref 0.0–0.1)
Basophils Relative: 0.7 % (ref 0.0–3.0)
Eosinophils Absolute: 0.2 10*3/uL (ref 0.0–0.7)
Eosinophils Relative: 2.7 % (ref 0.0–5.0)
HCT: 40.1 % (ref 36.0–46.0)
Hemoglobin: 13.4 g/dL (ref 12.0–15.0)
Lymphocytes Relative: 29.3 % (ref 12.0–46.0)
Lymphs Abs: 2.2 10*3/uL (ref 0.7–4.0)
MCHC: 33.4 g/dL (ref 30.0–36.0)
MCV: 86.1 fl (ref 78.0–100.0)
Monocytes Absolute: 0.3 10*3/uL (ref 0.1–1.0)
Monocytes Relative: 3.7 % (ref 3.0–12.0)
Neutro Abs: 4.8 10*3/uL (ref 1.4–7.7)
Neutrophils Relative %: 63.6 % (ref 43.0–77.0)
Platelets: 331 10*3/uL (ref 150.0–400.0)
RBC: 4.66 Mil/uL (ref 3.87–5.11)
RDW: 13.8 % (ref 11.5–15.5)
WBC: 7.6 10*3/uL (ref 4.0–10.5)

## 2022-12-22 LAB — LIPID PANEL
Cholesterol: 155 mg/dL (ref 0–200)
HDL: 50.6 mg/dL (ref >39.00)
LDL Cholesterol: 87 mg/dL (ref 0–99)
NonHDL: 104.11
Total CHOL/HDL Ratio: 3
Triglycerides: 87 mg/dL (ref 0.0–149.0)
VLDL: 17.4 mg/dL (ref 0.0–40.0)

## 2022-12-22 LAB — HEMOGLOBIN A1C: Hgb A1c MFr Bld: 7.4 % — ABNORMAL HIGH (ref 4.6–6.5)

## 2022-12-22 LAB — VITAMIN B12: Vitamin B-12: 237 pg/mL (ref 211–911)

## 2022-12-22 LAB — TSH: TSH: 1.69 u[IU]/mL (ref 0.35–5.50)

## 2022-12-22 MED ORDER — ALPRAZOLAM 1 MG PO TABS: 1.0000 mg | ORAL_TABLET | Freq: Every day | ORAL | 5 refills | Status: DC | PRN

## 2022-12-22 MED ORDER — SEMAGLUTIDE (1 MG/DOSE) 4 MG/3ML ~~LOC~~ SOPN: 1.0000 mg | PEN_INJECTOR | SUBCUTANEOUS | 3 refills | Status: DC

## 2022-12-22 MED ORDER — DOXYCYCLINE HYCLATE 100 MG PO TABS: 100.0000 mg | ORAL_TABLET | Freq: Two times a day (BID) | ORAL | 0 refills | Status: DC

## 2022-12-22 NOTE — Patient Instructions (Signed)
Please take all new medication as prescribed - the antibiotic  Ok to increase the xanax to 1 mg daily as needed  Please continue all other medications as before, and refills have been done if requested.  Please have the pharmacy call with any other refills you may need.  Please continue your efforts at being more active, low cholesterol diet, and weight control.  You are otherwise up to date with prevention measures today.  Please keep your appointments with your specialists as you may have planned  You will be contacted regarding the referral for: Podiatry  Please go to the LAB at the blood drawing area for the tests to be done  You will be contacted by phone if any changes need to be made immediately.  Otherwise, you will receive a letter about your results with an explanation, but please check with MyChart first.  Please remember to sign up for MyChart if you have not done so, as this will be important to you in the future with finding out test results, communicating by private email, and scheduling acute appointments online when needed.  Please make an Appointment to return in 6 months, or sooner if needed

## 2022-12-22 NOTE — Progress Notes (Signed)
Patient ID: Nicole Melendez, female   DOB: 04/25/1976, 47 y.o.   MRN: [id number]         Chief Complaint:: wellness exam and Follow-up (Xanax not taking affect as it did previously) and Medication Refill (Epi pens)  , right toe cellulitis, dm, low vit d, anxiety       HPI:  Nicole Melendez is a 47 y.o. female here for wellness exam; declines covid booster, flu shot, o/w up to date                        Also xanax not working as well recently, now with more sleep difficulty.  Alsways worse around her bday when she thinks about her deceased parents.  Pt denies chest pain, increased sob or doe, wheezing, orthopnea, PND, increased LE swelling, palpitations, dizziness or syncope.   Pt denies polydipsia, polyuria, or new focal neuro s/s.    Pt denies fever, wt loss, night sweats, loss of appetite, or other constitutional symptoms  Does have 5 days onset right toe red, tender swelling after dropped an object on it, no ulcer, fever, chills, drainage or red streaks.  Also has bilateral bunions, asks for podiatry referral with ongoing pain.     Wt Readings from Last 3 Encounters:  12/22/22 (!) 324 lb (147 kg)  02/12/22 (!) 325 lb (147.4 kg)  01/15/22 (!) 327 lb 6.4 oz (148.5 kg)   BP Readings from Last 3 Encounters:  12/22/22 130/76  02/12/22 138/90  01/15/22 116/72   Immunization History  Administered Date(s) Administered   Influenza Split 07/07/2011, 07/21/2012   Influenza,inj,Quad PF,6+ Mos 01/04/2014, 07/08/2014, 07/02/2015, 06/15/2017, 07/18/2018, 07/27/2019   Influenza-Unspecified 06/15/2017   PFIZER(Purple Top)SARS-COV-2 Vaccination 01/26/2020, 02/23/2020   Pneumococcal Conjugate-13 10/28/2015   Pneumococcal Polysaccharide-23 11/26/2015   Td 10/25/1997   Tdap 01/03/2013   There are no preventive care reminders to display for this patient.     Past Medical History:  Diagnosis Date   Allergic rhinitis, cause unspecified    Anemia    Anxiety    Arthritis    COPD (chronic  obstructive pulmonary disease) ([place])    Depression    Diabetes mellitus type II    steroid related, patient states "im no longer diabetic"   Essential hypertension 08/08/2015   GERD (gastroesophageal reflux disease)    History of blood transfusion    Hyperlipidemia    Menorrhagia    Migraine    Morbid obesity ([place])    Otitis media 06/28/2015   Peptic ulcer disease    Pneumonia    Positive ANA (antinuclear antibody) 02/14/2012   Sarcoid    including hand per rheumatology-Dr. 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([place])    Shortness of breath    on exertion   Varicose veins with pain    Past Surgical History:  Procedure Laterality Date   ABDOMINAL HYSTERECTOMY     COLONOSCOPY WITH PROPOFOL N/A 10/19/2016   Procedure: COLONOSCOPY WITH PROPOFOL;  Surgeon: Mauri Pole, MD;  Location: [facility];  Service: Endoscopy;  Laterality: N/A;   ESOPHAGOGASTRODUODENOSCOPY (EGD) WITH PROPOFOL N/A 10/19/2016   Procedure: ESOPHAGOGASTRODUODENOSCOPY (EGD) WITH PROPOFOL;  Surgeon: Mauri Pole, MD;  Location: [facility];  Service: Endoscopy;  Laterality: N/A;   HERNIA MESH REMOVAL  02/2013   HERNIA REPAIR     KNEE ARTHROSCOPY Left 01/15/2022   Procedure: ARTHROSCOPY KNEE;  Surgeon: Netta Cedars, MD;  Location: [facility];  Service: Orthopedics;  Laterality: Left;  uterine ablation  03/2010   WISDOM TOOTH EXTRACTION      reports that she quit smoking about 10 years ago. Her smoking use included cigarettes. She has a 20.00 pack-year smoking history. She has never used smokeless tobacco. She reports that she does not drink alcohol and does not use drugs. family history includes Allergies in her mother; Bone cancer in her maternal aunt; Breast cancer in her maternal aunt; Diabetes in her father and mother; Heart attack in her mother; Heart disease in her father; Hypertension in an other family member; Lung cancer in her maternal aunt; Ovarian cancer in her maternal aunt; Rheum arthritis in her  father; Stroke in her father and maternal uncle. Allergies  Allergen Reactions   Azithromycin Hives    (z pak) hives   Bee Venom Anaphylaxis   Flagyl [Metronidazole] Hives and Shortness Of Breath   Penicillins Shortness Of Breath and Swelling    Has patient had a PCN reaction causing immediate rash, facial/tongue/throat swelling, SOB or lightheadedness with hypotension: Yes Has patient had a PCN reaction causing severe rash involving mucus membranes or skin necrosis: No Has patient had a PCN reaction that required hospitalization No Has patient had a PCN reaction occurring within the last 10 years: No If all of the above answers are "NO", then may proceed with Cephalosporin use.  REACTION: swelling and difficulty breathing   Shellfish Allergy Anaphylaxis   Klonopin [Clonazepam] Other (See Comments)    Memory difficulty   Cephalexin Hives and Swelling   Cephalosporins Hives   Current Outpatient Medications on File Prior to Visit  Medication Sig Dispense Refill   acetaminophen (TYLENOL) 650 MG CR tablet Take 650-1,300 mg by mouth every 8 (eight) hours as needed for pain.     albuterol (VENTOLIN HFA) 108 (90 Base) MCG/ACT inhaler INHALE 2 PUFFS INTO THE LUNGS EVERY 4 HOURS AS NEEDED FOR WHEEZING OR SHORTNESS OF BREATH 8.5 g 0   azelastine (ASTELIN) 0.1 % nasal spray Place 2 sprays into both nostrils 2 (two) times daily. Use in each nostril as directed 30 mL 12   baclofen (LIORESAL) 10 MG tablet Take 1-2 tablets (10-20 mg total) by mouth at bedtime as needed for muscle spasms. 60 tablet 3   BD PEN NEEDLE NANO 2ND GEN 32G X 4 MM MISC USE AS DIRECTED THREE TIMES DAILY 300 each 3   benazepril-hydrochlorthiazide (LOTENSIN HCT) 20-25 MG tablet Take 1 tablet by mouth daily.     Blood Glucose Monitoring Suppl (ONETOUCH VERIO) w/Device KIT Use as directed daily E11.9 1 kit 0   clindamycin (CLEOCIN) 150 MG capsule Take 150 mg by mouth 4 (four) times daily.     fluconazole (DIFLUCAN) 150 MG tablet 1  tab by mouth every 3 days as needed 2 tablet 1   gabapentin (NEURONTIN) 300 MG capsule 3 tab by mouth three times daily 270 capsule 2   glucose blood (ONETOUCH VERIO) test strip USE AS DIRECTED THREE TIMES DAILY 100 strip 11   insulin aspart (NOVOLOG FLEXPEN) 100 UNIT/ML FlexPen ADMINISTER 12 UNITS UNDER THE SKIN FOUR TIMES DAILY 15 mL 2   ketorolac (TORADOL) 10 MG tablet Take by mouth.     levalbuterol (XOPENEX HFA) 45 MCG/ACT inhaler Inhale 1-2 puffs into the lungs every 8 (eight) hours as needed for wheezing or shortness of breath. USE 2 PUFFS 3 TIMES DAILY X 5 DAYS THEN BACK TO HOME REGIMEN AS NEEDED. 1 each 0   LORazepam (ATIVAN) 2 MG tablet Take 2 mg by mouth  2 (two) times daily as needed.     mometasone-formoterol (DULERA) 200-5 MCG/ACT AERO Inhale 2 puffs into the lungs in the morning and at bedtime. 8.8 g 5   nystatin (MYCOSTATIN) 100000 UNIT/ML suspension TAKE 5 MLS BY MOUTH 4(FOUR) TIMES DAILY. SWISH FOR 30 SECONDS AND SWALLOW 180 mL 0   omeprazole (PRILOSEC) 40 MG capsule TAKE 1 CAPSULE(40 MG) BY MOUTH DAILY (Patient taking differently: 80 mg daily.) 90 capsule 3   ondansetron (ZOFRAN ODT) 4 MG disintegrating tablet Take 1 tablet (4 mg total) by mouth every 8 (eight) hours as needed for nausea or vomiting. 30 tablet 1   oxyCODONE-acetaminophen (PERCOCET) 10-325 MG tablet Take 1 tablet by mouth every 4 (four) hours as needed for pain. 30 tablet 0   predniSONE (DELTASONE) 10 MG tablet 3 tabs by mouth per day for 3 days,2tabs per day for 3 days,1tab per day for 3 days 18 tablet 0   rizatriptan (MAXALT-MLT) 10 MG disintegrating tablet Take 1 tablet earliest onset of migraine.  May repeat in 2 hours if needed.  Maximum 2 tablets in 24 hours 9 tablet 11   rosuvastatin (CRESTOR) 40 MG tablet 1 tab by mouth once daily 30 tablet 11   sertraline (ZOLOFT) 100 MG tablet Take 1 tablet (100 mg total) by mouth 2 (two) times daily. 60 tablet 11   telmisartan-hydrochlorothiazide (MICARDIS HCT) 40-12.5 MG  tablet Take 1 tablet by mouth daily. 30 tablet 2   Tiotropium Bromide Monohydrate (SPIRIVA RESPIMAT) 1.25 MCG/ACT AERS Inhale 2 puffs into the lungs daily. 4 g 0   TURMERIC PO Take 1 tablet by mouth daily.     vitamin B-12 (CYANOCOBALAMIN) 1000 MCG tablet Take 1,000 mcg by mouth daily.     Vitamin D, Ergocalciferol, (DRISDOL) 1.25 MG (50000 UNIT) CAPS capsule Take by mouth.     zonisamide (ZONEGRAN) 100 MG capsule Take 2 capsules (200 mg total) by mouth daily. 60 capsule 11   No current facility-administered medications on file prior to visit.        ROS:  All others reviewed and negative.  Objective        PE:  BP 130/76 (BP Location: Left Arm, Patient Position: Sitting, Cuff Size: Large)   Pulse 80   Temp 98.1 F (36.7 C) (Oral)   Ht '5\' 7"'$  (1.702 m)   Wt (!) 324 lb (147 kg)   LMP 07/18/2011   SpO2 97%   BMI 50.75 kg/m                 Constitutional: Pt appears in NAD               HENT: Head: NCAT.                Right Ear: External ear normal.                 Left Ear: External ear normal.                Eyes: . Pupils are equal, round, and reactive to light. Conjunctivae and EOM are normal               Nose: without d/c or deformity               Neck: Neck supple. Gross normal ROM               Cardiovascular: Normal rate and regular rhythm.  Pulmonary/Chest: Effort normal and breath sounds without rales or wheezing.                Abd:  Soft, NT, ND, + BS, no organomegaly               Neurological: Pt is alert. At baseline orientation, motor grossly intact               Skin: Skin is warm. LE edema - none but has right great dorsal toe red, tedner swelling; also with bilateral bunions                Psychiatric: Pt behavior is normal without agitation , nervous  Micro: none  Cardiac tracings I have personally interpreted today:  none  Pertinent Radiological findings (summarize): none   Lab Results  Component Value Date   WBC 7.6 12/22/2022   HGB  13.4 12/22/2022   HCT 40.1 12/22/2022   PLT 331.0 12/22/2022   GLUCOSE 113 (H) 12/22/2022   CHOL 155 12/22/2022   TRIG 87.0 12/22/2022   HDL 50.60 12/22/2022   LDLDIRECT 135.0 07/05/2011   LDLCALC 87 12/22/2022   ALT 8 12/22/2022   AST 10 12/22/2022   NA 139 12/22/2022   K 4.0 12/22/2022   CL 106 12/22/2022   CREATININE 0.70 12/22/2022   BUN 9 12/22/2022   CO2 25 12/22/2022   TSH 1.69 12/22/2022   INR 1.3 11/17/2012   HGBA1C 7.4 (H) 12/22/2022   MICROALBUR <0.7 12/22/2022   Assessment/Plan:  BERNETTE DEGEORGE is a 46 y.o. Black or African American [2] female with  has a past medical history of Allergic rhinitis, cause unspecified, Anemia, Anxiety, Arthritis, COPD (chronic obstructive pulmonary disease) ([place]), Depression, Diabetes mellitus type II, Essential hypertension (08/08/2015), GERD (gastroesophageal reflux disease), History of blood transfusion, Hyperlipidemia, Menorrhagia, Migraine, Morbid obesity ([place]), Otitis media (06/28/2015), Peptic ulcer disease, Pneumonia, Positive ANA (antinuclear antibody) (02/14/2012), Sarcoid, Sarcoidosis of lung ([place]), Shortness of breath, and Varicose veins with pain.  Encounter for well adult exam with abnormal findings Age and sex appropriate education and counseling updated with regular exercise and diet Referrals for preventative services - none needed Immunizations addressed - declines covid booster, flu shot Smoking counseling  - none needed Evidence for depression or other mood disorder - severe anxiety, asks for xanax increase Most recent labs reviewed. I have personally reviewed and have noted: 1) the patient's medical and social history 2) The patient's current medications and supplements 3) The patient's height, weight, and BMI have been recorded in the chart   Cellulitis of great toe Mild to mod, for antibx course doxycycline 100 bid,,  to f/u any worsening symptoms or concerns  Diabetes mellitus with neuropathy ([place]) Lab  Results  Component Value Date   HGBA1C 7.4 (H) 12/22/2022   Uncontrolled,, pt to continue current medical treatment novolog 12 u qid, ozempic 1 mg weekly, and f/u endo per pt preference   Essential hypertension BP Readings from Last 3 Encounters:  12/22/22 130/76  02/12/22 138/90  01/15/22 116/72   Stable, pt to continue medical treatment micardis hct 40-12.5 qd   Vitamin D deficiency Last vitamin D Lab Results  Component Value Date   VD25OH 14.37 (L) 12/22/2022   Low, to start oral replacement   Anxiety state Worsening recently subjective, for xanax 1 mg qd prn  Nail disorder For podiatry referral  Followup: Return in about 6 months (around 06/22/2023).  Cathlean Cower, MD 12/25/2022 8:13 PM [place]  Primary Care - [facility]

## 2022-12-22 NOTE — Telephone Encounter (Signed)
Patient returned call about test results. She would like a call back at [phone].

## 2022-12-23 ENCOUNTER — Telehealth: Payer: Self-pay

## 2022-12-23 NOTE — Telephone Encounter (Signed)
PA Denied- full letter uploaded to Media

## 2022-12-23 NOTE — Telephone Encounter (Signed)
Pt PA for Ozempic is send, waiting for approval    (Key: [id number]

## 2022-12-24 NOTE — Progress Notes (Unsigned)
NEUROLOGY FOLLOW UP OFFICE NOTE  JULIANNA ZENGEL [id number]  Assessment/Plan:   Migraine without aura, without status migrainosus, not intractable     1.  Migraine prevention: zonisamide '200mg'$  daily *** 3. Migraine rescue:  rizatriptan '10mg'$  *** 4.  Limit use of pain relievers to no more than 2 days out of week to prevent risk of rebound or medication-overuse headache. 5.  Keep headache diary 6.  Follow up in one year ***   Subjective:  Rajanae Causevic is a 47 year old right-handed female with sarcoidosis, diabetes with neuropathy, GERD, PUD, HTN, and hyperlipidemia who follows up for migraines and lumbar radiculopathy   MIGRAINE: Update: Intensity:  7/10 Duration:  Usually 1 hour with rizatriptan '10mg'$ . Frequency:  2 a month (sometimes up to 4) Ran out of zonisamide and rizatriptan and has had daily headache since then.   Current NSAIDS: None (has PUD) Current analgesics: Tylenol, Percocet (pain) Current triptans: rizatriptan '10mg'$  Current ergotamine: None Current anti-emetic: Zofran 4 mg Current muscle relaxants: baclofen 10-'20mg'$  QHS PRN Current anti-anxiolytic: Alprazolam, hydroxyzine Current sleep aide: None Current Antihypertensive medications: Lotensin Current Antidepressant medications: Sertraline Current Anticonvulsant medications: Zonisamide 200 mg, gabapentin '1200mg'$  BID   Current anti-CGRP: None Current Vitamins/Herbal/Supplements: Folic acid, D3, turmeric, B12 Current Antihistamines/Decongestants: none Other therapy: None Hormone/birth control: None   Following fall in January 2022, had symptoms of neck pain and cervical radiculopathy.  MRI of cervical spine on 02/01/2021 personally reviewed showed cervical spondylosis most prominent from C4 through C7 with right paracentral disc protrusion at C6-7 contacting the right C7 nerve root with severe bilateral neural foraminal narrowing and mild central canal stenosis..   Caffeine: No Alcohol: No Smoker: No Diet:  Hydrates, baked chicken, steamed vegetables Exercise: Yes Depression: Yes; Anxiety: Yes.  Both controlled. Other pain: back pain Sleep hygiene: Good   History: Onset:  Mid-30s Location:  Back of head, right sided Quality:  pounding Initial Intensity:  8-9/10 Aura:  no Prodrome:  no Associated symptoms: Nausea, vomiting, osmophobia, right eye blurred vision, sometimes photophobia.  There is no associated unilateral numbness or weakness. Initial Duration:  2 days (within one hour with sumatriptan) Initial Frequency:  Varies.  Some months 1-2 days, some months 10 days Triggers:  Unknown Relieving factors:  no Activity:  Cannot function 1 to 2 days per month   Past NSAIDS:  Ibuprofen, naproxen (discontinued due to stomach ulcers) Past analgesics:  Percocet, tramadol (for back pain) Past abortive triptans:  sumatriptan '100mg'$  Past muscle relaxants:  tizanidine '4mg'$ , Flexeril Past anti-emetic:  Phenergan, Zofran Past antihypertensive medications: Lasix Past antidepressant medications:  Wellbutrin Past anticonvulsant medications:  topiramate (caused nausea and vomiting) Past vitamins/Herbal/Supplements:  no Past antihistamines/decongestants:  Zyrtec Other past therapies:  meditation  Family history of headache:  mother   RIGHT LUMBAR RADICULOPATHY: Update: Followed by pain management.  Receiving injections in the back.  Right leg is becoming stronger.  Walking over a mile a day.     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  On 03/14/19, she underwent radiofrequency neurolysis of the left lumbar facet nerves at L2-L3, L3-L4, L4-L5 and L5-S1 levels.  She had no complications.  On 04/23/19, she underwent the same procedure on the right side at the same levels. She noted some right leg weakness afterwards with some pain.  It progressively got worse.  When she is on her feet, the right lower back pain increases and her leg gives out but no leg pain.  No  numbness in right leg but notes new numbness in the big toe of the left foot.  Due to the weakness, she started having falls.  She also notes that sometimes when wakes up in the morning, she has trouble making it to the bathroom to urinate.  She has had incontinence.  No perineal numbness.  She now resorts to using a cane to ambulate.  She had NCV-EMG of the lower extremities on 05/22/19, which demonstrated diminished amplitude of both tibaial motor nerves and absent F-wave response in the right peroneal motor nerve but otherwise unremarkable with no explanation for symptoms.  MRI of lumbar spine without contrast on 06/09/19 showed stable degenerative disc disease and facet arthropathy with mild bilateral neuroforaminal stenosis at L4-L5 and moderate neuroforaminal stenosis at L5-S1, but no new structural explanation for symptoms.  Since this started, she reports increased migraines.      PAST MEDICAL HISTORY: Past Medical History:  Diagnosis Date   Allergic rhinitis, cause unspecified    Anemia    Anxiety    Arthritis    COPD (chronic obstructive pulmonary disease) ([place])    Depression    Diabetes mellitus type II    steroid related, patient states "im no longer diabetic"   Essential hypertension 08/08/2015   GERD (gastroesophageal reflux disease)    History of blood transfusion    Hyperlipidemia    Menorrhagia    Migraine    Morbid obesity ([place])    Otitis media 06/28/2015   Peptic ulcer disease    Pneumonia    Positive ANA (antinuclear antibody) 02/14/2012   Sarcoid    including hand per rheumatology-Dr. 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([place])    Shortness of breath    on exertion   Varicose veins with pain     MEDICATIONS: Current Outpatient Medications on File Prior to Visit  Medication Sig Dispense Refill   Semaglutide, 1 MG/DOSE, 4 MG/3ML SOPN Inject 1 mg as directed once a week. 9 mL 3   acetaminophen (TYLENOL) 650 MG CR tablet Take 650-1,300 mg by mouth every 8 (eight) hours as  needed for pain.     albuterol (VENTOLIN HFA) 108 (90 Base) MCG/ACT inhaler INHALE 2 PUFFS INTO THE LUNGS EVERY 4 HOURS AS NEEDED FOR WHEEZING OR SHORTNESS OF BREATH 8.5 g 0   ALPRAZolam (XANAX) 1 MG tablet Take 1 tablet (1 mg total) by mouth daily as needed for anxiety. 30 tablet 5   azelastine (ASTELIN) 0.1 % nasal spray Place 2 sprays into both nostrils 2 (two) times daily. Use in each nostril as directed 30 mL 12   baclofen (LIORESAL) 10 MG tablet Take 1-2 tablets (10-20 mg total) by mouth at bedtime as needed for muscle spasms. 60 tablet 3   BD PEN NEEDLE NANO 2ND GEN 32G X 4 MM MISC USE AS DIRECTED THREE TIMES DAILY 300 each 3   benazepril-hydrochlorthiazide (LOTENSIN HCT) 20-25 MG tablet Take 1 tablet by mouth daily.     Blood Glucose Monitoring Suppl (ONETOUCH VERIO) w/Device KIT Use as directed daily E11.9 1 kit 0   clindamycin (CLEOCIN) 150 MG capsule Take 150 mg by mouth 4 (four) times daily.     doxycycline (VIBRA-TABS) 100 MG tablet Take 1 tablet (100 mg total) by mouth 2 (two) times daily. 20 tablet 0   fluconazole (DIFLUCAN) 150 MG tablet 1 tab by mouth every 3 days as needed 2 tablet 1   gabapentin (NEURONTIN) 300 MG capsule 3 tab by mouth three times daily 270  capsule 2   glucose blood (ONETOUCH VERIO) test strip USE AS DIRECTED THREE TIMES DAILY 100 strip 11   insulin aspart (NOVOLOG FLEXPEN) 100 UNIT/ML FlexPen ADMINISTER 12 UNITS UNDER THE SKIN FOUR TIMES DAILY 15 mL 2   ketorolac (TORADOL) 10 MG tablet Take by mouth.     levalbuterol (XOPENEX HFA) 45 MCG/ACT inhaler Inhale 1-2 puffs into the lungs every 8 (eight) hours as needed for wheezing or shortness of breath. USE 2 PUFFS 3 TIMES DAILY X 5 DAYS THEN BACK TO HOME REGIMEN AS NEEDED. 1 each 0   LORazepam (ATIVAN) 2 MG tablet Take 2 mg by mouth 2 (two) times daily as needed.     mometasone-formoterol (DULERA) 200-5 MCG/ACT AERO Inhale 2 puffs into the lungs in the morning and at bedtime. 8.8 g 5   nystatin (MYCOSTATIN) 100000  UNIT/ML suspension TAKE 5 MLS BY MOUTH 4(FOUR) TIMES DAILY. SWISH FOR 30 SECONDS AND SWALLOW 180 mL 0   omeprazole (PRILOSEC) 40 MG capsule TAKE 1 CAPSULE(40 MG) BY MOUTH DAILY (Patient taking differently: 80 mg daily.) 90 capsule 3   ondansetron (ZOFRAN ODT) 4 MG disintegrating tablet Take 1 tablet (4 mg total) by mouth every 8 (eight) hours as needed for nausea or vomiting. 30 tablet 1   oxyCODONE-acetaminophen (PERCOCET) 10-325 MG tablet Take 1 tablet by mouth every 4 (four) hours as needed for pain. 30 tablet 0   predniSONE (DELTASONE) 10 MG tablet 3 tabs by mouth per day for 3 days,2tabs per day for 3 days,1tab per day for 3 days 18 tablet 0   rizatriptan (MAXALT-MLT) 10 MG disintegrating tablet Take 1 tablet earliest onset of migraine.  May repeat in 2 hours if needed.  Maximum 2 tablets in 24 hours 9 tablet 11   rosuvastatin (CRESTOR) 40 MG tablet 1 tab by mouth once daily 30 tablet 11   sertraline (ZOLOFT) 100 MG tablet Take 1 tablet (100 mg total) by mouth 2 (two) times daily. 60 tablet 11   telmisartan-hydrochlorothiazide (MICARDIS HCT) 40-12.5 MG tablet Take 1 tablet by mouth daily. 30 tablet 2   Tiotropium Bromide Monohydrate (SPIRIVA RESPIMAT) 1.25 MCG/ACT AERS Inhale 2 puffs into the lungs daily. 4 g 0   TURMERIC PO Take 1 tablet by mouth daily.     vitamin B-12 (CYANOCOBALAMIN) 1000 MCG tablet Take 1,000 mcg by mouth daily.     Vitamin D, Ergocalciferol, (DRISDOL) 1.25 MG (50000 UNIT) CAPS capsule Take by mouth.     zonisamide (ZONEGRAN) 100 MG capsule Take 2 capsules (200 mg total) by mouth daily. 60 capsule 11   No current facility-administered medications on file prior to visit.    ALLERGIES: Allergies  Allergen Reactions   Azithromycin Hives    (z pak) hives   Bee Venom Anaphylaxis   Flagyl [Metronidazole] Hives and Shortness Of Breath   Penicillins Shortness Of Breath and Swelling    Has patient had a PCN reaction causing immediate rash, facial/tongue/throat swelling,  SOB or lightheadedness with hypotension: Yes Has patient had a PCN reaction causing severe rash involving mucus membranes or skin necrosis: No Has patient had a PCN reaction that required hospitalization No Has patient had a PCN reaction occurring within the last 10 years: No If all of the above answers are "NO", then may proceed with Cephalosporin use.  REACTION: swelling and difficulty breathing   Shellfish Allergy Anaphylaxis   Klonopin [Clonazepam] Other (See Comments)    Memory difficulty   Cephalexin Hives and Swelling   Cephalosporins Hives  FAMILY HISTORY: Family History  Problem Relation Age of Onset   Allergies Mother    Heart attack Mother    Diabetes Mother    Diabetes Father    Heart disease Father    Rheum arthritis Father    Stroke Father    Hypertension Other    Ovarian cancer Maternal Aunt    Lung cancer Maternal Aunt    Breast cancer Maternal Aunt    Bone cancer Maternal Aunt    Stroke Maternal Uncle    Other Neg Hx       Objective:  *** General: No acute distress.  Patient appears ***-groomed.   Head:  Normocephalic/atraumatic Eyes:  Fundi examined but not visualized Neck: supple, no paraspinal tenderness, full range of motion Heart:  Regular rate and rhythm Neurological Exam: alert and oriented to person, place, and time.  Speech fluent and not dysarthric, language intact.  CN II-XII intact. Bulk and tone normal, muscle strength 5/5 throughout.  Sensation to light touch intact.  Deep tendon reflexes 2+ throughout.  Finger to nose testing intact.  Gait normal, Romberg negative.   Metta Clines, DO  CC: Cathlean Cower, MD

## 2022-12-25 ENCOUNTER — Encounter: Payer: Self-pay | Admitting: Internal Medicine

## 2022-12-25 DIAGNOSIS — L609 Nail disorder, unspecified: Secondary | ICD-10-CM | POA: Insufficient documentation

## 2022-12-25 NOTE — Assessment & Plan Note (Signed)
Last vitamin D Lab Results  Component Value Date   VD25OH 14.37 (L) 12/22/2022   Low, to start oral replacement

## 2022-12-25 NOTE — Assessment & Plan Note (Signed)
For podiatry referral

## 2022-12-25 NOTE — Assessment & Plan Note (Signed)
Mild to mod, for antibx course doxycycline 100 bid,,  to f/u any worsening symptoms or concerns

## 2022-12-25 NOTE — Assessment & Plan Note (Signed)
Age and sex appropriate education and counseling updated with regular exercise and diet Referrals for preventative services - none needed Immunizations addressed - declines covid booster, flu shot Smoking counseling  - none needed Evidence for depression or other mood disorder - severe anxiety, asks for xanax increase Most recent labs reviewed. I have personally reviewed and have noted: 1) the patient's medical and social history 2) The patient's current medications and supplements 3) The patient's height, weight, and BMI have been recorded in the chart

## 2022-12-25 NOTE — Assessment & Plan Note (Signed)
Lab Results  Component Value Date   HGBA1C 7.4 (H) 12/22/2022   Uncontrolled,, pt to continue current medical treatment novolog 12 u qid, ozempic 1 mg weekly, and f/u endo per pt preference

## 2022-12-25 NOTE — Assessment & Plan Note (Signed)
Worsening recently subjective, for xanax 1 mg qd prn

## 2022-12-25 NOTE — Assessment & Plan Note (Signed)
BP Readings from Last 3 Encounters:  12/22/22 130/76  02/12/22 138/90  01/15/22 116/72   Stable, pt to continue medical treatment micardis hct 40-12.5 qd

## 2022-12-27 ENCOUNTER — Encounter: Payer: Self-pay | Admitting: Neurology

## 2022-12-27 ENCOUNTER — Ambulatory Visit: Payer: Medicaid Other | Admitting: Neurology

## 2022-12-27 VITALS — BP 139/76 | HR 88 | Ht 67.0 in | Wt 318.2 lb

## 2022-12-27 DIAGNOSIS — G43009 Migraine without aura, not intractable, without status migrainosus: Secondary | ICD-10-CM | POA: Diagnosis not present

## 2022-12-27 MED ORDER — ZONISAMIDE 100 MG PO CAPS: 200.0000 mg | ORAL_CAPSULE | Freq: Every day | ORAL | 3 refills | Status: DC

## 2022-12-27 MED ORDER — RIZATRIPTAN BENZOATE 10 MG PO TBDP: ORAL_TABLET | ORAL | 11 refills | Status: DC

## 2022-12-27 MED ORDER — ONDANSETRON 4 MG PO TBDP: 4.0000 mg | ORAL_TABLET | Freq: Three times a day (TID) | ORAL | 11 refills | Status: DC | PRN

## 2022-12-28 ENCOUNTER — Encounter: Payer: Self-pay | Admitting: Podiatry

## 2022-12-28 ENCOUNTER — Ambulatory Visit: Payer: Medicaid Other | Admitting: Podiatry

## 2022-12-28 DIAGNOSIS — L600 Ingrowing nail: Secondary | ICD-10-CM

## 2022-12-28 NOTE — Patient Instructions (Signed)

## 2022-12-28 NOTE — Telephone Encounter (Signed)
Patient called back and states that her PA on the Ozempic was denied - she needs her request to state that she also suffer from heart failure.  Please send this request in again.

## 2022-12-28 NOTE — Progress Notes (Signed)
  Subjective:  Patient ID: Nicole Melendez, female    DOB: Feb 17, 1976,  MRN: [id number]  Chief Complaint  Patient presents with   Nail Problem    np Type 2 diabetes mellitus with diabetic neuropathy, with long-term current use of insulin ([place]) L60.9 (ICD-10-CM) - Nail disorder / right great toenail injured swollen and infected per pcp. on antibiotics/ warm to touch      47 y.o. female presents with the above complaint. History confirmed with patient.  Her diabetes is fairly well-controlled she thinks her A1c is 7.4%. Objective:  Physical Exam: warm, good capillary refill, no trophic changes or ulcerative lesions, normal DP and PT pulses, normal sensory exam, and right hallux medial border painful ingrown, left hallux is not painful.  Assessment:   1. Ingrowing right great toenail      Plan:  Patient was evaluated and treated and all questions answered.    Ingrown Nail, right -Patient elects to proceed with minor surgery to remove ingrown toenail today. Consent reviewed and signed by patient.  Left hallux was not particularly painful today so I discussed monitoring this currently.  She will let me know if this worsens -Ingrown nail excised. See procedure note. -Educated on post-procedure care including soaking. Written instructions provided and reviewed. -She will complete her antibiotics  Procedure: Excision of Ingrown Toenail Location: Left 1st toe medial nail borders. Anesthesia: Lidocaine 1% plain; 1.5 mL and Marcaine 0.5% plain; 1.5 mL, digital block. Skin Prep: Betadine. Dressing: Silvadene; telfa; dry, sterile, compression dressing. Technique: Following skin prep, the toe was exsanguinated and a tourniquet was secured at the base of the toe. The affected nail border was freed, split with a nail splitter, and excised.  No matricectomy performed.  The tourniquet was then removed and sterile dressing applied. Disposition: Patient tolerated procedure well.    Return if  symptoms worsen or fail to improve.

## 2022-12-31 ENCOUNTER — Encounter: Payer: Self-pay | Admitting: Internal Medicine

## 2022-12-31 NOTE — Telephone Encounter (Signed)
Pharmacy Patient Advocate Encounter  Prior Authorization for Nicole Melendez has been approved by [facility] [place] Medicaid  [place] # [id number] Effective dates: 12/31/2022 through 12/31/2023

## 2023-01-04 ENCOUNTER — Encounter: Payer: Self-pay | Admitting: Internal Medicine

## 2023-01-04 ENCOUNTER — Other Ambulatory Visit: Payer: Self-pay | Admitting: Internal Medicine

## 2023-01-05 ENCOUNTER — Telehealth: Payer: Self-pay | Admitting: Internal Medicine

## 2023-01-05 MED ORDER — TIRZEPATIDE 2.5 MG/0.5ML ~~LOC~~ SOAJ: 2.5000 mg | SUBCUTANEOUS | 11 refills | Status: DC

## 2023-01-05 NOTE — Telephone Encounter (Signed)
Patient was prescribe Mounjaro today 01/05/23 and needs the prior authorization done for it. She would like for it to be sent to the [facility] on [address].

## 2023-01-06 ENCOUNTER — Other Ambulatory Visit (HOSPITAL_COMMUNITY): Payer: Self-pay

## 2023-01-06 MED ORDER — GABAPENTIN 300 MG PO CAPS: ORAL_CAPSULE | ORAL | 1 refills | Status: DC

## 2023-01-06 NOTE — Telephone Encounter (Signed)
Pharmacy Patient Advocate Encounter   Prior authorization is required for [facility] 2.'5MG'$ /0.5ML pen-injectors. PA submitted and APPROVED on 01/06/23.  Key [id number] Effective: 01/06/23 - 01/07/24

## 2023-01-06 NOTE — Addendum Note (Signed)
Addended by: Biagio Borg on: 01/06/2023 12:21 PM   Modules accepted: Orders

## 2023-01-14 DIAGNOSIS — E119 Type 2 diabetes mellitus without complications: Secondary | ICD-10-CM | POA: Diagnosis not present

## 2023-01-14 DIAGNOSIS — Z6841 Body Mass Index (BMI) 40.0 and over, adult: Secondary | ICD-10-CM | POA: Diagnosis not present

## 2023-01-14 DIAGNOSIS — M5440 Lumbago with sciatica, unspecified side: Secondary | ICD-10-CM | POA: Diagnosis not present

## 2023-01-14 DIAGNOSIS — R2689 Other abnormalities of gait and mobility: Secondary | ICD-10-CM | POA: Diagnosis not present

## 2023-01-14 DIAGNOSIS — M7061 Trochanteric bursitis, right hip: Secondary | ICD-10-CM | POA: Diagnosis not present

## 2023-01-14 DIAGNOSIS — F410 Panic disorder [episodic paroxysmal anxiety] without agoraphobia: Secondary | ICD-10-CM | POA: Diagnosis not present

## 2023-01-14 DIAGNOSIS — Z79899 Other long term (current) drug therapy: Secondary | ICD-10-CM | POA: Diagnosis not present

## 2023-01-14 DIAGNOSIS — F411 Generalized anxiety disorder: Secondary | ICD-10-CM | POA: Diagnosis not present

## 2023-01-14 DIAGNOSIS — M539 Dorsopathy, unspecified: Secondary | ICD-10-CM | POA: Diagnosis not present

## 2023-01-14 DIAGNOSIS — E559 Vitamin D deficiency, unspecified: Secondary | ICD-10-CM | POA: Diagnosis not present

## 2023-01-17 DIAGNOSIS — Z79899 Other long term (current) drug therapy: Secondary | ICD-10-CM | POA: Diagnosis not present

## 2023-02-08 ENCOUNTER — Ambulatory Visit: Payer: Medicaid Other | Admitting: Podiatry

## 2023-02-08 DIAGNOSIS — L600 Ingrowing nail: Secondary | ICD-10-CM

## 2023-02-08 MED ORDER — CLINDAMYCIN HCL 300 MG PO CAPS: 300.0000 mg | ORAL_CAPSULE | Freq: Three times a day (TID) | ORAL | 0 refills | Status: AC

## 2023-02-08 NOTE — Patient Instructions (Signed)

## 2023-02-08 NOTE — Progress Notes (Signed)
Subjective:  Patient ID: Nicole Melendez, female    DOB: [birth year],  MRN: [id number]  Chief Complaint  Patient presents with   Ingrown Toenail    Patient came in today for left foot hallux ingrown toenail, started 2 days ago, some drainage and bleeding     47 y.o. female presents with concern for pain in the left hallux.  Patient does have a history of diabetes.  Has pain on the medial border of the left great toenail.  Says there has been some drainage.  Has pain with any pressure on the area.  Previously had an ingrown nail removed on the medial border of the right hallux nail by Dr. Lilian Kapur and has been doing well.  Past Medical History:  Diagnosis Date   Allergic rhinitis, cause unspecified    Anemia    Anxiety    Arthritis    COPD (chronic obstructive pulmonary disease) (HCC)    Depression    Diabetes mellitus type II    steroid related, patient states "im no longer diabetic"   Essential hypertension 08/08/2015   GERD (gastroesophageal reflux disease)    History of blood transfusion    Hyperlipidemia    Menorrhagia    Migraine    Morbid obesity (HCC)    Otitis media 06/28/2015   Peptic ulcer disease    Pneumonia    Positive ANA (antinuclear antibody) 02/14/2012   Sarcoid    including hand per rheumatology-Dr. 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(HCC)    Shortness of breath    on exertion   Varicose veins with pain     Allergies  Allergen Reactions   Azithromycin Hives    (z pak) hives   Bee Venom Anaphylaxis   Flagyl [Metronidazole] Hives and Shortness Of Breath   Penicillins Shortness Of Breath and Swelling    Has patient had a PCN reaction causing immediate rash, facial/tongue/throat swelling, SOB or lightheadedness with hypotension: Yes Has patient had a PCN reaction causing severe rash involving mucus membranes or skin necrosis: No Has patient had a PCN reaction that required hospitalization No Has patient had a PCN reaction occurring within the last 10 years:  No If all of the above answers are "NO", then may proceed with Cephalosporin use.  REACTION: swelling and difficulty breathing   Shellfish Allergy Anaphylaxis   Klonopin [Clonazepam] Other (See Comments)    Memory difficulty   Cephalexin Hives and Swelling   Cephalosporins Hives    ROS: Negative except as per HPI above  Objective:  General: AAO x3, NAD  Dermatological: Incurvation is present along the medial nail border of the left great toe. There is localized edema without any erythema or increase in warmth around the nail border. There is no drainage or pus. There is no ascending cellulitis. No malodor. No open lesions or pre-ulcerative lesions.    Vascular:  Dorsalis Pedis artery and Posterior Tibial artery pedal pulses are 2/4 bilateral.  Capillary fill time < 3 sec to all digits.   Neruologic: Grossly intact via light touch bilateral. Protective threshold intact to all sites bilateral.   Musculoskeletal: No gross boney pedal deformities bilateral. No pain, crepitus, or limitation noted with foot and ankle range of motion bilateral. Muscular strength 5/5 in all groups tested bilateral.  Gait: Unassisted, Nonantalgic.   No images are attached to the encounter.  Assessment:   1. Ingrown nail of great toe of left foot      Plan:  Patient was evaluated and treated and all  questions answered.    Ingrown Nail, left -Patient elects to proceed with minor surgery to remove ingrown toenail today. Consent reviewed and signed by patient. -Ingrown nail excised. See procedure note. -Educated on post-procedure care including soaking. Written instructions provided and reviewed. -Patient to follow up in 2 weeks for nail check. -eRx for clindamycin 3 mg 3 times daily for 5 days for prophylaxis Procedure: Excision of Ingrown Toenail Location: Left 1st toe medial nail borders. Anesthesia: Lidocaine 1% plain; 1.5 mL and Marcaine 0.5% plain; 1.5 mL, digital block. Skin Prep:  Betadine. Dressing: Silvadene; telfa; dry, sterile, compression dressing. Technique: Following skin prep, the toe was exsanguinated and a tourniquet was secured at the base of the toe. The affected nail border was freed, split with a nail splitter, and excised. Chemical matrixectomy was then performed with phenol and irrigated out with alcohol. The tourniquet was then removed and sterile dressing applied. Disposition: Patient tolerated procedure well. Patient to return in 2 weeks for follow-up.    Return in about 2 weeks (around 02/22/2023) for nail check.          Corinna Gab, DPM [facility] / [facility]

## 2023-02-09 DIAGNOSIS — E119 Type 2 diabetes mellitus without complications: Secondary | ICD-10-CM | POA: Diagnosis not present

## 2023-02-09 DIAGNOSIS — Z79899 Other long term (current) drug therapy: Secondary | ICD-10-CM | POA: Diagnosis not present

## 2023-02-09 DIAGNOSIS — M5440 Lumbago with sciatica, unspecified side: Secondary | ICD-10-CM | POA: Diagnosis not present

## 2023-02-09 DIAGNOSIS — Z111 Encounter for screening for respiratory tuberculosis: Secondary | ICD-10-CM | POA: Diagnosis not present

## 2023-02-09 DIAGNOSIS — F411 Generalized anxiety disorder: Secondary | ICD-10-CM | POA: Diagnosis not present

## 2023-02-09 DIAGNOSIS — M7061 Trochanteric bursitis, right hip: Secondary | ICD-10-CM | POA: Diagnosis not present

## 2023-02-09 DIAGNOSIS — F410 Panic disorder [episodic paroxysmal anxiety] without agoraphobia: Secondary | ICD-10-CM | POA: Diagnosis not present

## 2023-02-09 DIAGNOSIS — M539 Dorsopathy, unspecified: Secondary | ICD-10-CM | POA: Diagnosis not present

## 2023-02-15 ENCOUNTER — Encounter: Payer: Medicaid Other | Admitting: Internal Medicine

## 2023-02-15 DIAGNOSIS — Z79899 Other long term (current) drug therapy: Secondary | ICD-10-CM | POA: Diagnosis not present

## 2023-02-22 ENCOUNTER — Ambulatory Visit: Payer: Medicaid Other | Admitting: Podiatry

## 2023-02-22 DIAGNOSIS — L600 Ingrowing nail: Secondary | ICD-10-CM

## 2023-02-22 NOTE — Progress Notes (Signed)
Subjective: Nicole Melendez is a 47 y.o.  female returns to office today for follow up evaluation after having left Hallux medial border nail ingrown removal with phenol and alcohol matrixectomy approximately 2 weeks ago. Patient has been soaking using epsom salts and applying topical antibiotic covered with bandaid daily. Patient denies fevers, chills, nausea, vomiting. Denies any calf pain, chest pain, SOB.   Objective:  Vitals: Reviewed  General: Well developed, nourished, in no acute distress, alert and oriented x3   Dermatology: Skin is warm, dry and supple bilateral. left hallux nail border appears to be clean, dry, with mild granular tissue and surrounding scab. There is no surrounding erythema, edema, drainage/purulence. The remaining nails appear unremarkable at this time. There are no other lesions or other signs of infection present.  Neurovascular status: Intact. No lower extremity swelling; No pain with calf compression bilateral.  Musculoskeletal: Decreased tenderness to palpation of the left hallux nail fold(s). Muscular strength within normal limits bilateral.   Assesement and Plan: S/p phenol and alcohol matrixectomy to the  left hallux nail medial, doing well.   -Continue soaking in epsom salts twice a day followed by antibiotic ointment and a band-aid. Can leave uncovered at night. Continue this until completely healed.  -If the area has not healed in 2 weeks, call the office for follow-up appointment, or sooner if any problems arise.  -Monitor for any signs/symptoms of infection. Call the office immediately if any occur or go directly to the emergency room. Call with any questions/concerns.        Corinna Gab, DPM [facility] / [facility]                   02/22/2023

## 2023-03-03 ENCOUNTER — Ambulatory Visit: Payer: Medicaid Other | Admitting: Podiatry

## 2023-03-05 DIAGNOSIS — M539 Dorsopathy, unspecified: Secondary | ICD-10-CM | POA: Diagnosis not present

## 2023-03-05 DIAGNOSIS — Z6841 Body Mass Index (BMI) 40.0 and over, adult: Secondary | ICD-10-CM | POA: Diagnosis not present

## 2023-03-05 DIAGNOSIS — M5440 Lumbago with sciatica, unspecified side: Secondary | ICD-10-CM | POA: Diagnosis not present

## 2023-03-05 DIAGNOSIS — M7061 Trochanteric bursitis, right hip: Secondary | ICD-10-CM | POA: Diagnosis not present

## 2023-03-05 DIAGNOSIS — G8929 Other chronic pain: Secondary | ICD-10-CM | POA: Diagnosis not present

## 2023-03-05 DIAGNOSIS — Z79899 Other long term (current) drug therapy: Secondary | ICD-10-CM | POA: Diagnosis not present

## 2023-03-05 DIAGNOSIS — E119 Type 2 diabetes mellitus without complications: Secondary | ICD-10-CM | POA: Diagnosis not present

## 2023-03-05 DIAGNOSIS — F410 Panic disorder [episodic paroxysmal anxiety] without agoraphobia: Secondary | ICD-10-CM | POA: Diagnosis not present

## 2023-03-05 DIAGNOSIS — F411 Generalized anxiety disorder: Secondary | ICD-10-CM | POA: Diagnosis not present

## 2023-03-22 ENCOUNTER — Encounter: Payer: Self-pay | Admitting: Internal Medicine

## 2023-03-22 ENCOUNTER — Ambulatory Visit (INDEPENDENT_AMBULATORY_CARE_PROVIDER_SITE_OTHER): Payer: Medicaid Other | Admitting: Internal Medicine

## 2023-03-22 VITALS — BP 126/80 | HR 85 | Temp 99.0°F | Ht 67.0 in | Wt 302.0 lb

## 2023-03-22 DIAGNOSIS — E1140 Type 2 diabetes mellitus with diabetic neuropathy, unspecified: Secondary | ICD-10-CM | POA: Diagnosis not present

## 2023-03-22 DIAGNOSIS — K5900 Constipation, unspecified: Secondary | ICD-10-CM

## 2023-03-22 DIAGNOSIS — E7849 Other hyperlipidemia: Secondary | ICD-10-CM | POA: Diagnosis not present

## 2023-03-22 DIAGNOSIS — Z794 Long term (current) use of insulin: Secondary | ICD-10-CM | POA: Diagnosis not present

## 2023-03-22 DIAGNOSIS — G43801 Other migraine, not intractable, with status migrainosus: Secondary | ICD-10-CM | POA: Diagnosis not present

## 2023-03-22 DIAGNOSIS — I10 Essential (primary) hypertension: Secondary | ICD-10-CM | POA: Diagnosis not present

## 2023-03-22 LAB — POCT GLYCOSYLATED HEMOGLOBIN (HGB A1C): Hemoglobin A1C: 6.2 % — AB (ref 4.0–5.6)

## 2023-03-22 MED ORDER — KETOROLAC TROMETHAMINE 30 MG/ML IJ SOLN
30.0000 mg | Freq: Once | INTRAMUSCULAR | Status: AC
Start: 2023-03-22 — End: 2023-03-22
  Administered 2023-03-22: 30 mg via INTRAMUSCULAR

## 2023-03-22 MED ORDER — NOVOLOG FLEXPEN 100 UNIT/ML ~~LOC~~ SOPN: PEN_INJECTOR | SUBCUTANEOUS | 2 refills | Status: AC

## 2023-03-22 MED ORDER — ONETOUCH VERIO VI STRP: ORAL_STRIP | 11 refills | Status: AC

## 2023-03-22 MED ORDER — NURTEC 75 MG PO TBDP: ORAL_TABLET | ORAL | 11 refills | Status: DC

## 2023-03-22 MED ORDER — TRULANCE 3 MG PO TABS: ORAL_TABLET | ORAL | 3 refills | Status: DC

## 2023-03-22 MED ORDER — SEMAGLUTIDE (2 MG/DOSE) 8 MG/3ML ~~LOC~~ SOPN: 2.0000 mg | PEN_INJECTOR | SUBCUTANEOUS | 3 refills | Status: DC

## 2023-03-22 NOTE — Patient Instructions (Addendum)
Your A1c was checked today  Ok to increase the ozempic to 2 mg weekly  You had the Toradol 30 mg shot for headache today  Please take all new medication as prescribed -the trulance 3 mg per day for constipation, and the nurtec as needed for migraine  Please continue all other medications as before, and refills have been done if requested - the novolog, and strips  Please have the pharmacy call with any other refills you may need.  Please continue your efforts at being more active, low cholesterol diet, and weight control  Please keep your appointments with your specialists as you may have planned  Please make an Appointment to return in 6 months, or sooner if needed

## 2023-03-22 NOTE — Progress Notes (Unsigned)
Patient ID: GALADRIEL CIRA, female   DOB: 1976-04-20, 47 y.o.   MRN: [id number]        Chief Complaint: follow up HTN, HLD and DM, constipation, migraine       HPI:  Nicole Melendez is a 47 y.o. female here with c/o 2 days onset diffuse throbbing HA similar to prior migraine, but no n/v or vision change.  Has had worsening constipation recently, despite taking miralax prn.  Pt denies chest pain, increased sob or doe, wheezing, orthopnea, PND, increased LE swelling, palpitations, dizziness or syncope.   Pt denies polydipsia, polyuria, or new focal neuro s/s.    Pt denies fever, wt loss, night sweats, loss of appetite, or other constitutional symptoms   Wt Readings from Last 3 Encounters:  03/22/23 (!) 302 lb (137 kg)  12/27/22 (!) 318 lb 3.2 oz (144.3 kg)  12/22/22 (!) 324 lb (147 kg)   BP Readings from Last 3 Encounters:  03/22/23 126/80  12/27/22 139/76  12/22/22 130/76         Past Medical History:  Diagnosis Date   Allergic rhinitis, cause unspecified    Anemia    Anxiety    Arthritis    COPD (chronic obstructive pulmonary disease) (HCC)    Depression    Diabetes mellitus type II    steroid related, patient states "im no longer diabetic"   Essential hypertension 08/08/2015   GERD (gastroesophageal reflux disease)    History of blood transfusion    Hyperlipidemia    Menorrhagia    Migraine    Morbid obesity (HCC)    Otitis media 06/28/2015   Peptic ulcer disease    Pneumonia    Positive ANA (antinuclear antibody) 02/14/2012   Sarcoid    including hand per rheumatology-Dr. 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(HCC)    Shortness of breath    on exertion   Varicose veins with pain    Past Surgical History:  Procedure Laterality Date   ABDOMINAL HYSTERECTOMY     COLONOSCOPY WITH PROPOFOL N/A 10/19/2016   Procedure: COLONOSCOPY WITH PROPOFOL;  Surgeon: Napoleon Form, MD;  Location: [facility];  Service: Endoscopy;  Laterality: N/A;   ESOPHAGOGASTRODUODENOSCOPY (EGD)  WITH PROPOFOL N/A 10/19/2016   Procedure: ESOPHAGOGASTRODUODENOSCOPY (EGD) WITH PROPOFOL;  Surgeon: Napoleon Form, MD;  Location: [facility];  Service: Endoscopy;  Laterality: N/A;   HERNIA MESH REMOVAL  02/2013   HERNIA REPAIR     KNEE ARTHROSCOPY Left 01/15/2022   Procedure: ARTHROSCOPY KNEE;  Surgeon: Beverely Low, MD;  Location: [facility];  Service: Orthopedics;  Laterality: Left;   uterine ablation  03/2010   WISDOM TOOTH EXTRACTION      reports that she quit smoking about 10 years ago. Her smoking use included cigarettes. She has a 20.00 pack-year smoking history. She has never used smokeless tobacco. She reports that she does not drink alcohol and does not use drugs. family history includes Allergies in her mother; Bone cancer in her maternal aunt; Breast cancer in her maternal aunt; Diabetes in her father and mother; Heart attack in her mother; Heart disease in her father; Hypertension in an other family member; Lung cancer in her maternal aunt; Ovarian cancer in her maternal aunt; Rheum arthritis in her father; Stroke in her father and maternal uncle. Allergies  Allergen Reactions   Azithromycin Hives    (z pak) hives   Bee Venom Anaphylaxis   Flagyl [Metronidazole] Hives and Shortness Of Breath   Penicillins Shortness Of Breath  and Swelling    Has patient had a PCN reaction causing immediate rash, facial/tongue/throat swelling, SOB or lightheadedness with hypotension: Yes Has patient had a PCN reaction causing severe rash involving mucus membranes or skin necrosis: No Has patient had a PCN reaction that required hospitalization No Has patient had a PCN reaction occurring within the last 10 years: No If all of the above answers are "NO", then may proceed with Cephalosporin use.  REACTION: swelling and difficulty breathing   Shellfish Allergy Anaphylaxis   Klonopin [Clonazepam] Other (See Comments)    Memory difficulty   Cephalexin Hives and Swelling   Cephalosporins Hives    Current Outpatient Medications on File Prior to Visit  Medication Sig Dispense Refill   acetaminophen (TYLENOL) 650 MG CR tablet Take 650-1,300 mg by mouth every 8 (eight) hours as needed for pain.     albuterol (VENTOLIN HFA) 108 (90 Base) MCG/ACT inhaler INHALE 2 PUFFS INTO THE LUNGS EVERY 4 HOURS AS NEEDED FOR WHEEZING OR SHORTNESS OF BREATH 8.5 g 0   ALPRAZolam (XANAX) 1 MG tablet Take 1 tablet (1 mg total) by mouth daily as needed for anxiety. 30 tablet 5   azelastine (ASTELIN) 0.1 % nasal spray Place 2 sprays into both nostrils 2 (two) times daily. Use in each nostril as directed 30 mL 12   BD PEN NEEDLE NANO 2ND GEN 32G X 4 MM MISC USE AS DIRECTED THREE TIMES DAILY 300 each 3   Blood Glucose Monitoring Suppl (ONETOUCH VERIO) w/Device KIT Use as directed daily E11.9 1 kit 0   clindamycin (CLEOCIN) 150 MG capsule Take 150 mg by mouth 4 (four) times daily.     gabapentin (NEURONTIN) 300 MG capsule 3 tab by mouth three times daily 270 capsule 1   levalbuterol (XOPENEX HFA) 45 MCG/ACT inhaler Inhale 1-2 puffs into the lungs every 8 (eight) hours as needed for wheezing or shortness of breath. USE 2 PUFFS 3 TIMES DAILY X 5 DAYS THEN BACK TO HOME REGIMEN AS NEEDED. 1 each 0   mometasone-formoterol (DULERA) 200-5 MCG/ACT AERO Inhale 2 puffs into the lungs in the morning and at bedtime. 8.8 g 5   nystatin (MYCOSTATIN) 100000 UNIT/ML suspension TAKE 5 MLS BY MOUTH 4(FOUR) TIMES DAILY. SWISH FOR 30 SECONDS AND SWALLOW 180 mL 0   ondansetron (ZOFRAN ODT) 4 MG disintegrating tablet Take 1 tablet (4 mg total) by mouth every 8 (eight) hours as needed for nausea or vomiting. 30 tablet 11   oxyCODONE-acetaminophen (PERCOCET) 10-325 MG tablet Take 1 tablet by mouth every 4 (four) hours as needed for pain. 30 tablet 0   rizatriptan (MAXALT-MLT) 10 MG disintegrating tablet Take 1 tablet earliest onset of migraine.  May repeat in 2 hours if needed.  Maximum 2 tablets in 24 hours 9 tablet 11   rosuvastatin  (CRESTOR) 40 MG tablet 1 tab by mouth once daily 30 tablet 11   sertraline (ZOLOFT) 100 MG tablet Take 1 tablet (100 mg total) by mouth 2 (two) times daily. 60 tablet 11   telmisartan-hydrochlorothiazide (MICARDIS HCT) 40-12.5 MG tablet Take 1 tablet by mouth daily. 30 tablet 2   TURMERIC PO Take 1 tablet by mouth daily.     vitamin B-12 (CYANOCOBALAMIN) 1000 MCG tablet Take 1,000 mcg by mouth daily.     Vitamin D, Ergocalciferol, (DRISDOL) 1.25 MG (50000 UNIT) CAPS capsule Take by mouth.     zonisamide (ZONEGRAN) 100 MG capsule Take 2 capsules (200 mg total) by mouth daily. 180 capsule 3  No current facility-administered medications on file prior to visit.        ROS:  All others reviewed and negative.  Objective        PE:  BP 126/80 (BP Location: Right Arm, Patient Position: Sitting, Cuff Size: Normal)   Pulse 85   Temp 99 F (37.2 C) (Oral)   Ht 5\' 7"  (1.702 m)   Wt (!) 302 lb (137 kg)   LMP 07/18/2011   SpO2 99%   BMI 47.30 kg/m                 Constitutional: Pt appears in NAD               HENT: Head: NCAT.                Right Ear: External ear normal.                 Left Ear: External ear normal.                Eyes: . Pupils are equal, round, and reactive to light. Conjunctivae and EOM are normal               Nose: without d/c or deformity               Neck: Neck supple. Gross normal ROM               Cardiovascular: Normal rate and regular rhythm.                 Pulmonary/Chest: Effort normal and breath sounds without rales or wheezing.                Abd:  Soft, NT, ND, + BS, no organomegaly               Neurological: Pt is alert. At baseline orientation, motor grossly intact               Skin: Skin is warm. No rashes, no other new lesions, LE edema - none               Psychiatric: Pt behavior is normal without agitation   Micro: none  Cardiac tracings I have personally interpreted today:  none  Pertinent Radiological findings (summarize): none   Lab  Results  Component Value Date   WBC 7.6 12/22/2022   HGB 13.4 12/22/2022   HCT 40.1 12/22/2022   PLT 331.0 12/22/2022   GLUCOSE 113 (H) 12/22/2022   CHOL 155 12/22/2022   TRIG 87.0 12/22/2022   HDL 50.60 12/22/2022   LDLDIRECT 135.0 07/05/2011   LDLCALC 87 12/22/2022   ALT 8 12/22/2022   AST 10 12/22/2022   NA 139 12/22/2022   K 4.0 12/22/2022   CL 106 12/22/2022   CREATININE 0.70 12/22/2022   BUN 9 12/22/2022   CO2 25 12/22/2022   TSH 1.69 12/22/2022   INR 1.3 11/17/2012   HGBA1C 6.2 (A) 03/22/2023   MICROALBUR <0.7 12/22/2022   Hemoglobin A1C 4.0 - 5.6 % 6.2 Abnormal  7.4 High    Assessment/Plan:  Nicole Melendez is a 47 y.o. Black or African American [2] female with  has a past medical history of Allergic rhinitis, cause unspecified, Anemia, Anxiety, Arthritis, COPD (chronic obstructive pulmonary disease) (HCC), Depression, Diabetes mellitus type II, Essential hypertension (08/08/2015), GERD (gastroesophageal reflux disease), History of blood transfusion, Hyperlipidemia, Menorrhagia, Migraine, Morbid obesity (HCC), Otitis media (06/28/2015), Peptic ulcer disease, Pneumonia, Positive  ANA (antinuclear antibody) (02/14/2012), Sarcoid, Sarcoidosis of lung (HCC), Shortness of breath, and Varicose veins with pain.  Diabetes mellitus with neuropathy (HCC) Lab Results  Component Value Date   HGBA1C 6.2 (A) 03/22/2023   With uncontrolled obesity, pt to increase the ozempic 2 mg weekly   Hyperlipidemia Lab Results  Component Value Date   LDLCALC 87 12/22/2022   Uncontrolled, goal ldl < 70,, pt to continue current statin crestor 40 mg and lower chol diet, declines other such as add zetia   Essential hypertension BP Readings from Last 3 Encounters:  03/22/23 126/80  12/27/22 139/76  12/22/22 130/76   Stable, pt to continue medical treatment micardis hct 40-12.5 qd   Migraine Unusual with last HA last year, etiology unclear, for toradol 30 mg IM, and nurtec 75 mg prn  future HA  Constipation Worsening recently, for trulance 3 mg sample and rx  Followup: Return in about 6 months (around 09/22/2023).  [name], MD 03/23/2023 7:10 PM [facility] - [facility]

## 2023-03-23 ENCOUNTER — Encounter: Payer: Self-pay | Admitting: Internal Medicine

## 2023-03-23 ENCOUNTER — Telehealth: Payer: Self-pay

## 2023-03-23 DIAGNOSIS — K5900 Constipation, unspecified: Secondary | ICD-10-CM | POA: Insufficient documentation

## 2023-03-23 NOTE — Assessment & Plan Note (Signed)
Unusual with last HA last year, etiology unclear, for toradol 30 mg IM, and nurtec 75 mg prn future HA

## 2023-03-23 NOTE — Assessment & Plan Note (Signed)
Lab Results  Component Value Date   LDLCALC 87 12/22/2022   Uncontrolled, goal ldl < 70,, pt to continue current statin crestor 40 mg and lower chol diet, declines other such as add zetia

## 2023-03-23 NOTE — Assessment & Plan Note (Signed)
Worsening recently, for trulance 3 mg sample and rx

## 2023-03-23 NOTE — Assessment & Plan Note (Signed)
BP Readings from Last 3 Encounters:  03/22/23 126/80  12/27/22 139/76  12/22/22 130/76   Stable, pt to continue medical treatment micardis hct 40-12.5 qd

## 2023-03-23 NOTE — Assessment & Plan Note (Signed)
Lab Results  Component Value Date   HGBA1C 6.2 (A) 03/22/2023   With uncontrolled obesity, pt to increase the ozempic 2 mg weekly

## 2023-03-25 ENCOUNTER — Other Ambulatory Visit (HOSPITAL_COMMUNITY): Payer: Self-pay

## 2023-03-25 ENCOUNTER — Telehealth: Payer: Self-pay

## 2023-03-25 ENCOUNTER — Other Ambulatory Visit: Payer: Self-pay

## 2023-03-25 MED ORDER — ONETOUCH VERIO W/DEVICE KIT: PACK | 0 refills | Status: AC

## 2023-03-25 NOTE — Telephone Encounter (Signed)
Pharmacy Patient Advocate Encounter   Received notification that prior authorization for Nurtec 75MG  dispersible tablets is required/requested.   PA submitted to  [facility]  via CoverMyMeds [employer] or [facility]) confirmation # [id number] Status is pending

## 2023-03-25 NOTE — Telephone Encounter (Signed)
PA submitted via CMM. Created new encounter for PA. Will route back to pool once determination has been made. 

## 2023-03-25 NOTE — Telephone Encounter (Signed)
Patient Advocate Encounter  Prior Authorization for Nurtec 75MG  dispersible tablets has been approved with [employer].    PA# [id number] Effective dates: 03/25/23 through 03/24/24  Per [facility] test claim, copay for 16 days supply is $4

## 2023-03-28 ENCOUNTER — Telehealth: Payer: Self-pay

## 2023-03-29 ENCOUNTER — Other Ambulatory Visit (HOSPITAL_COMMUNITY): Payer: Self-pay

## 2023-03-29 ENCOUNTER — Telehealth: Payer: Self-pay

## 2023-03-29 NOTE — Telephone Encounter (Signed)
Pharmacy Patient Advocate Encounter   Received notification that prior authorization for Trulance 3mg  is required/requested.   PA submitted to [facility] [place] Medicaid via CoverMyMeds [employer]  # [id number] Status is pending

## 2023-03-30 NOTE — Telephone Encounter (Signed)
Pharmacy Patient Advocate Encounter  Received notification from [facility] that the request for prior authorization for Trulance 3MG  tablets has been denied due to no trial and failure of two formulary preferred drugs - Amitiza, Linzess.    Please be advised we currently do not have a Pharmacist to review denials, therefore you will need to process appeals accordingly as needed. Thanks for your support at this time.   You may call [phone] to request a peer-to-peer review.

## 2023-03-30 NOTE — Telephone Encounter (Signed)
PA denied. Please see telephone encounter on 03/29/23

## 2023-03-31 ENCOUNTER — Encounter: Payer: Self-pay | Admitting: Family Medicine

## 2023-03-31 ENCOUNTER — Telehealth (INDEPENDENT_AMBULATORY_CARE_PROVIDER_SITE_OTHER): Payer: Medicaid Other | Admitting: Family Medicine

## 2023-03-31 DIAGNOSIS — Z794 Long term (current) use of insulin: Secondary | ICD-10-CM | POA: Diagnosis not present

## 2023-03-31 DIAGNOSIS — E1140 Type 2 diabetes mellitus with diabetic neuropathy, unspecified: Secondary | ICD-10-CM | POA: Diagnosis not present

## 2023-03-31 DIAGNOSIS — R112 Nausea with vomiting, unspecified: Secondary | ICD-10-CM | POA: Diagnosis not present

## 2023-03-31 NOTE — Progress Notes (Signed)
MyChart Video Visit    Virtual Visit via Video Note    Patient location: Home. Patient and provider in visit Provider location: Office  I discussed the limitations of evaluation and management by telemedicine and the availability of in person appointments. The patient expressed understanding and agreed to proceed.  Visit Date: 03/31/2023  Today's healthcare provider: Hetty Blend, NP-C     Subjective:    Patient ID: Nicole Melendez, female    DOB: Mar 23, 1976, 47 y.o.   MRN: [id number]  Chief Complaint  Patient presents with   Nausea    HPI  C/o nausea and vomiting.   Ozempic does increased this week. Started low dose Ozempic 4 weeks. Has been nauseated since starting it but N/V more severe since increased dose this week.  Took the last injection last Friday.   Normal bowel movements. No abdominal pain.  Urinating normally.   Hx of pancreatitis.   Staying hydrated.  Taking Miralax.   Denies fever, chills, dizziness, chest pain, palpitations, shortness of breath, abdominal pain, diarrhea, constipation, urinary symptoms.   Past Medical History:  Diagnosis Date   Allergic rhinitis, cause unspecified    Anemia    Anxiety    Arthritis    COPD (chronic obstructive pulmonary disease) (HCC)    Depression    Diabetes mellitus type II    steroid related, patient states "im no longer diabetic"   Essential hypertension 08/08/2015   GERD (gastroesophageal reflux disease)    History of blood transfusion    Hyperlipidemia    Menorrhagia    Migraine    Morbid obesity (HCC)    Otitis media 06/28/2015   Peptic ulcer disease    Pneumonia    Positive ANA (antinuclear antibody) 02/14/2012   Sarcoid    including hand per rheumatology-Dr. 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(HCC)    Shortness of breath    on exertion   Varicose veins with pain     Past Surgical History:  Procedure Laterality Date   ABDOMINAL HYSTERECTOMY     COLONOSCOPY WITH PROPOFOL N/A 10/19/2016    Procedure: COLONOSCOPY WITH PROPOFOL;  Surgeon: Napoleon Form, MD;  Location: [facility];  Service: Endoscopy;  Laterality: N/A;   ESOPHAGOGASTRODUODENOSCOPY (EGD) WITH PROPOFOL N/A 10/19/2016   Procedure: ESOPHAGOGASTRODUODENOSCOPY (EGD) WITH PROPOFOL;  Surgeon: Napoleon Form, MD;  Location: [facility];  Service: Endoscopy;  Laterality: N/A;   HERNIA MESH REMOVAL  02/2013   HERNIA REPAIR     KNEE ARTHROSCOPY Left 01/15/2022   Procedure: ARTHROSCOPY KNEE;  Surgeon: Beverely Low, MD;  Location: [facility];  Service: Orthopedics;  Laterality: Left;   uterine ablation  03/2010   WISDOM TOOTH EXTRACTION      Family History  Problem Relation Age of Onset   Allergies Mother    Heart attack Mother    Diabetes Mother    Diabetes Father    Heart disease Father    Rheum arthritis Father    Stroke Father    Hypertension Other    Ovarian cancer Maternal Aunt    Lung cancer Maternal Aunt    Breast cancer Maternal Aunt    Bone cancer Maternal Aunt    Stroke Maternal Uncle    Other Neg Hx     Social History   Socioeconomic History   Marital status: Single    Spouse name: Not on file   Number of children: 0   Years of education: Not on file   Highest education level: Master's  degree (e.g., MA, MS, MEng, MEd, MSW, MBA)  Occupational History   Occupation: personal shopper  Tobacco Use   Smoking status: Former    Packs/day: 1.00    Years: 20.00    Additional pack years: 0.00    Total pack years: 20.00    Types: Cigarettes    Quit date: 10/25/2012    Years since quitting: 10.4   Smokeless tobacco: Never   Tobacco comments:    QUIT 04/2010 AND STARTED BACK 2014 X 3 MONTHS. less than 1 ppd.  started at age 1.    Vaping Use   Vaping Use: Never used  Substance and Sexual Activity   Alcohol use: No   Drug use: No   Sexual activity: Yes    Birth control/protection: None, Surgical  Other Topics Concern   Not on file  Social History Narrative   Pt lives with Dalbert Batman-  also pt of LHC      Has 2 bachelors/1 masters; is working; R handed - one level home; R handed; rare caffeine;       Social Determinants of Health   Financial Resource Strain: Not on file  Food Insecurity: Not on file  Transportation Needs: Not on file  Physical Activity: Not on file  Stress: Not on file  Social Connections: Not on file  Intimate Partner Violence: Not on file    Outpatient Medications Prior to Visit  Medication Sig Dispense Refill   acetaminophen (TYLENOL) 650 MG CR tablet Take 650-1,300 mg by mouth every 8 (eight) hours as needed for pain.     albuterol (VENTOLIN HFA) 108 (90 Base) MCG/ACT inhaler INHALE 2 PUFFS INTO THE LUNGS EVERY 4 HOURS AS NEEDED FOR WHEEZING OR SHORTNESS OF BREATH 8.5 g 0   ALPRAZolam (XANAX) 1 MG tablet Take 1 tablet (1 mg total) by mouth daily as needed for anxiety. 30 tablet 5   azelastine (ASTELIN) 0.1 % nasal spray Place 2 sprays into both nostrils 2 (two) times daily. Use in each nostril as directed 30 mL 12   BD PEN NEEDLE NANO 2ND GEN 32G X 4 MM MISC USE AS DIRECTED THREE TIMES DAILY 300 each 3   Blood Glucose Monitoring Suppl (ONETOUCH VERIO) w/Device KIT Use as directed daily E11.9 1 kit 0   clindamycin (CLEOCIN) 150 MG capsule Take 150 mg by mouth 4 (four) times daily.     gabapentin (NEURONTIN) 300 MG capsule 3 tab by mouth three times daily 270 capsule 1   glucose blood (ONETOUCH VERIO) test strip USE AS DIRECTED THREE TIMES DAILY E11.9 300 strip 11   insulin aspart (NOVOLOG FLEXPEN) 100 UNIT/ML FlexPen ADMINISTER 12 UNITS UNDER THE SKIN FOUR TIMES DAILY 15 mL 2   levalbuterol (XOPENEX HFA) 45 MCG/ACT inhaler Inhale 1-2 puffs into the lungs every 8 (eight) hours as needed for wheezing or shortness of breath. USE 2 PUFFS 3 TIMES DAILY X 5 DAYS THEN BACK TO HOME REGIMEN AS NEEDED. 1 each 0   mometasone-formoterol (DULERA) 200-5 MCG/ACT AERO Inhale 2 puffs into the lungs in the morning and at bedtime. 8.8 g 5   nystatin (MYCOSTATIN)  100000 UNIT/ML suspension TAKE 5 MLS BY MOUTH 4(FOUR) TIMES DAILY. SWISH FOR 30 SECONDS AND SWALLOW 180 mL 0   ondansetron (ZOFRAN ODT) 4 MG disintegrating tablet Take 1 tablet (4 mg total) by mouth every 8 (eight) hours as needed for nausea or vomiting. 30 tablet 11   oxyCODONE-acetaminophen (PERCOCET) 10-325 MG tablet Take 1 tablet by mouth every 4 (  four) hours as needed for pain. 30 tablet 0   Plecanatide (TRULANCE) 3 MG TABS 1 tab by mouth once daily 90 tablet 3   Rimegepant Sulfate (NURTEC) 75 MG TBDP 1 tab by mouth once daily as needed 16 tablet 11   rizatriptan (MAXALT-MLT) 10 MG disintegrating tablet Take 1 tablet earliest onset of migraine.  May repeat in 2 hours if needed.  Maximum 2 tablets in 24 hours 9 tablet 11   rosuvastatin (CRESTOR) 40 MG tablet 1 tab by mouth once daily 30 tablet 11   Semaglutide, 2 MG/DOSE, 8 MG/3ML SOPN Inject 2 mg as directed once a week. 9 mL 3   sertraline (ZOLOFT) 100 MG tablet Take 1 tablet (100 mg total) by mouth 2 (two) times daily. 60 tablet 11   telmisartan-hydrochlorothiazide (MICARDIS HCT) 40-12.5 MG tablet Take 1 tablet by mouth daily. 30 tablet 2   TURMERIC PO Take 1 tablet by mouth daily.     vitamin B-12 (CYANOCOBALAMIN) 1000 MCG tablet Take 1,000 mcg by mouth daily.     Vitamin D, Ergocalciferol, (DRISDOL) 1.25 MG (50000 UNIT) CAPS capsule Take by mouth.     zonisamide (ZONEGRAN) 100 MG capsule Take 2 capsules (200 mg total) by mouth daily. 180 capsule 3   No facility-administered medications prior to visit.    Allergies  Allergen Reactions   Azithromycin Hives    (z pak) hives   Bee Venom Anaphylaxis   Flagyl [Metronidazole] Hives and Shortness Of Breath   Penicillins Shortness Of Breath and Swelling    Has patient had a PCN reaction causing immediate rash, facial/tongue/throat swelling, SOB or lightheadedness with hypotension: Yes Has patient had a PCN reaction causing severe rash involving mucus membranes or skin necrosis: No Has  patient had a PCN reaction that required hospitalization No Has patient had a PCN reaction occurring within the last 10 years: No If all of the above answers are "NO", then may proceed with Cephalosporin use.  REACTION: swelling and difficulty breathing   Shellfish Allergy Anaphylaxis   Klonopin [Clonazepam] Other (See Comments)    Memory difficulty   Cephalexin Hives and Swelling   Cephalosporins Hives    ROS     Objective:    Physical Exam  LMP 07/18/2011  Wt Readings from Last 3 Encounters:  03/22/23 (!) 302 lb (137 kg)  12/27/22 (!) 318 lb 3.2 oz (144.3 kg)  12/22/22 (!) 324 lb (147 kg)   Alert and oriented and in no acute distress.  Respirations unlabored.  Normal speech and mood.    Assessment & Plan:   Problem List Items Addressed This Visit       Digestive   Nausea & vomiting - Primary     Endocrine   Diabetes mellitus with neuropathy (HCC)    Discussed potential etiologies including her current GLP-1 medication.  Increased dose of Ozempic last week and now having worsening nausea as well as vomiting.  She has Zofran at home. History of pancreatitis per EMR.  Discussed that she may have pancreatitis and I recommend holding Ozempic until she follows up with her PCP.  No sign of bowel obstruction, having normal bowel movements. Recommend staying hydrated and eating small amounts of bland food as tolerated.  Advised her to go to the ED or urgent care if she has fever, chills, abdominal pain or worsening symptoms. Discussed follow-up next week in office for labs and further evaluation.  I am having Nicole Melendez maintain her cyanocobalamin, TURMERIC PO, acetaminophen, albuterol, BD  Pen Needle Nano 2nd Gen, nystatin, mometasone-formoterol, levalbuterol, azelastine, oxyCODONE-acetaminophen, rosuvastatin, sertraline, telmisartan-hydrochlorothiazide, Vitamin D (Ergocalciferol), ALPRAZolam, zonisamide, ondansetron, rizatriptan, gabapentin, clindamycin, Semaglutide (2  MG/DOSE), OneTouch Verio, NovoLOG FlexPen, Trulance, Nurtec, and OneTouch Verio.  No orders of the defined types were placed in this encounter.   I discussed the assessment and treatment plan with the patient. The patient was provided an opportunity to ask questions and all were answered. The patient agreed with the plan and demonstrated an understanding of the instructions.   The patient was advised to call back or seek an in-person evaluation if the symptoms worsen or if the condition fails to improve as anticipated.  I provided 17 minutes of face-to-face time during this encounter.   Hetty Blend, NP-C [facility] [phone] (phone) [phone] (fax)  [facility]

## 2023-04-01 ENCOUNTER — Encounter: Payer: Self-pay | Admitting: Family Medicine

## 2023-04-01 DIAGNOSIS — R112 Nausea with vomiting, unspecified: Secondary | ICD-10-CM

## 2023-04-01 LAB — CBC WITH DIFFERENTIAL/PLATELET
Basophils Absolute: 0.1 10*3/uL (ref 0.0–0.1)
Basophils Relative: 0.8 % (ref 0.0–3.0)
Eosinophils Absolute: 0.2 10*3/uL (ref 0.0–0.7)
Eosinophils Relative: 2.2 % (ref 0.0–5.0)
HCT: 40.6 % (ref 36.0–46.0)
Hemoglobin: 13.2 g/dL (ref 12.0–15.0)
Lymphocytes Relative: 30 % (ref 12.0–46.0)
Lymphs Abs: 2.4 10*3/uL (ref 0.7–4.0)
MCHC: 32.4 g/dL (ref 30.0–36.0)
MCV: 87.1 fl (ref 78.0–100.0)
Monocytes Absolute: 0.4 10*3/uL (ref 0.1–1.0)
Monocytes Relative: 5 % (ref 3.0–12.0)
Neutro Abs: 5.1 10*3/uL (ref 1.4–7.7)
Neutrophils Relative %: 62 % (ref 43.0–77.0)
Platelets: 378 10*3/uL (ref 150.0–400.0)
RBC: 4.66 Mil/uL (ref 3.87–5.11)
RDW: 14.7 % (ref 11.5–15.5)
WBC: 8.2 10*3/uL (ref 4.0–10.5)

## 2023-04-01 LAB — COMPREHENSIVE METABOLIC PANEL
ALT: 9 U/L (ref 0–35)
AST: 12 U/L (ref 0–37)
Albumin: 4.1 g/dL (ref 3.5–5.2)
Alkaline Phosphatase: 117 U/L (ref 39–117)
BUN: 11 mg/dL (ref 6–23)
CO2: 26 mEq/L (ref 19–32)
Calcium: 9.5 mg/dL (ref 8.4–10.5)
Chloride: 101 mEq/L (ref 96–112)
Creatinine, Ser: 1.01 mg/dL (ref 0.40–1.20)
GFR: 65.49 mL/min (ref >60.00)
Glucose, Bld: 91 mg/dL (ref 70–99)
Potassium: 3.5 mEq/L (ref 3.5–5.1)
Sodium: 137 mEq/L (ref 135–145)
Total Bilirubin: 0.5 mg/dL (ref 0.2–1.2)
Total Protein: 7.5 g/dL (ref 6.0–8.3)

## 2023-04-01 LAB — LIPASE: Lipase: 94 U/L — ABNORMAL HIGH (ref 11.0–59.0)

## 2023-04-01 NOTE — Progress Notes (Signed)
Her lipase blood test is elevated suggesting she has pancreatitis. Fortunately, her other labs are fine. As we discussed, she should not restart the Ozempic. If she is unable to eat or keep fluids down and feels dehydrated, she will need to go to the emergency department.

## 2023-04-01 NOTE — Telephone Encounter (Signed)
Did you want pt to receive labs?

## 2023-04-02 ENCOUNTER — Encounter: Payer: Self-pay | Admitting: Internal Medicine

## 2023-04-02 DIAGNOSIS — Z79899 Other long term (current) drug therapy: Secondary | ICD-10-CM | POA: Diagnosis not present

## 2023-04-02 DIAGNOSIS — E119 Type 2 diabetes mellitus without complications: Secondary | ICD-10-CM | POA: Diagnosis not present

## 2023-04-02 DIAGNOSIS — M539 Dorsopathy, unspecified: Secondary | ICD-10-CM | POA: Diagnosis not present

## 2023-04-02 DIAGNOSIS — M5440 Lumbago with sciatica, unspecified side: Secondary | ICD-10-CM | POA: Diagnosis not present

## 2023-04-02 DIAGNOSIS — M7061 Trochanteric bursitis, right hip: Secondary | ICD-10-CM | POA: Diagnosis not present

## 2023-04-02 DIAGNOSIS — F411 Generalized anxiety disorder: Secondary | ICD-10-CM | POA: Diagnosis not present

## 2023-04-02 DIAGNOSIS — F410 Panic disorder [episodic paroxysmal anxiety] without agoraphobia: Secondary | ICD-10-CM | POA: Diagnosis not present

## 2023-04-04 ENCOUNTER — Telehealth: Payer: Medicaid Other | Admitting: Family Medicine

## 2023-04-04 ENCOUNTER — Encounter (HOSPITAL_BASED_OUTPATIENT_CLINIC_OR_DEPARTMENT_OTHER): Payer: Self-pay | Admitting: Emergency Medicine

## 2023-04-04 ENCOUNTER — Emergency Department (HOSPITAL_BASED_OUTPATIENT_CLINIC_OR_DEPARTMENT_OTHER)
Admission: EM | Admit: 2023-04-04 | Discharge: 2023-04-04 | Disposition: A | Discharge status: Home / Self Care | Payer: Medicaid Other | Attending: Emergency Medicine | Admitting: Emergency Medicine

## 2023-04-04 ENCOUNTER — Emergency Department (HOSPITAL_BASED_OUTPATIENT_CLINIC_OR_DEPARTMENT_OTHER): Payer: Medicaid Other

## 2023-04-04 ENCOUNTER — Other Ambulatory Visit: Payer: Self-pay

## 2023-04-04 DIAGNOSIS — E1165 Type 2 diabetes mellitus with hyperglycemia: Secondary | ICD-10-CM | POA: Insufficient documentation

## 2023-04-04 DIAGNOSIS — R1032 Left lower quadrant pain: Secondary | ICD-10-CM | POA: Insufficient documentation

## 2023-04-04 DIAGNOSIS — Z7951 Long term (current) use of inhaled steroids: Secondary | ICD-10-CM | POA: Insufficient documentation

## 2023-04-04 DIAGNOSIS — J449 Chronic obstructive pulmonary disease, unspecified: Secondary | ICD-10-CM | POA: Insufficient documentation

## 2023-04-04 DIAGNOSIS — R1012 Left upper quadrant pain: Secondary | ICD-10-CM | POA: Diagnosis not present

## 2023-04-04 DIAGNOSIS — R112 Nausea with vomiting, unspecified: Secondary | ICD-10-CM | POA: Diagnosis not present

## 2023-04-04 DIAGNOSIS — Z794 Long term (current) use of insulin: Secondary | ICD-10-CM | POA: Diagnosis not present

## 2023-04-04 LAB — CBC
HCT: 38.9 % (ref 36.0–46.0)
Hemoglobin: 13 g/dL (ref 12.0–15.0)
MCH: 28 pg (ref 26.0–34.0)
MCHC: 33.4 g/dL (ref 30.0–36.0)
MCV: 83.7 fL (ref 80.0–100.0)
Platelets: 354 10*3/uL (ref 150–400)
RBC: 4.65 MIL/uL (ref 3.87–5.11)
RDW: 14.1 % (ref 11.5–15.5)
WBC: 8.3 10*3/uL (ref 4.0–10.5)
nRBC: 0 % (ref 0.0–0.2)

## 2023-04-04 LAB — COMPREHENSIVE METABOLIC PANEL
ALT: 7 U/L (ref 0–44)
AST: 10 U/L — ABNORMAL LOW (ref 15–41)
Albumin: 4.2 g/dL (ref 3.5–5.0)
Alkaline Phosphatase: 108 U/L (ref 38–126)
Anion gap: 10 (ref 5–15)
BUN: 10 mg/dL (ref 6–20)
CO2: 25 mmol/L (ref 22–32)
Calcium: 9.8 mg/dL (ref 8.9–10.3)
Chloride: 100 mmol/L (ref 98–111)
Creatinine, Ser: 0.67 mg/dL (ref 0.44–1.00)
GFR, Estimated: >60 mL/min (ref >60)
Glucose, Bld: 131 mg/dL — ABNORMAL HIGH (ref 70–99)
Potassium: 3.5 mmol/L (ref 3.5–5.1)
Sodium: 135 mmol/L (ref 135–145)
Total Bilirubin: 0.5 mg/dL (ref 0.3–1.2)
Total Protein: 7.3 g/dL (ref 6.5–8.1)

## 2023-04-04 LAB — URINALYSIS, ROUTINE W REFLEX MICROSCOPIC
Bilirubin Urine: NEGATIVE
Glucose, UA: NEGATIVE mg/dL
Hgb urine dipstick: NEGATIVE
Ketones, ur: NEGATIVE mg/dL
Leukocytes,Ua: NEGATIVE
Nitrite: NEGATIVE
Protein, ur: NEGATIVE mg/dL
Specific Gravity, Urine: 1.015 (ref 1.005–1.030)
pH: 7.5 (ref 5.0–8.0)

## 2023-04-04 LAB — LIPASE, BLOOD: Lipase: 86 U/L — ABNORMAL HIGH (ref 11–51)

## 2023-04-04 MED ORDER — ONDANSETRON HCL 4 MG/2ML IJ SOLN
4.0000 mg | Freq: Once | INTRAMUSCULAR | Status: AC
  Administered 2023-04-04: 4 mg via INTRAVENOUS
  Filled 2023-04-04: qty 2

## 2023-04-04 MED ORDER — MORPHINE SULFATE (PF) 4 MG/ML IV SOLN
4.0000 mg | Freq: Once | INTRAVENOUS | Status: AC
  Administered 2023-04-04: 4 mg via INTRAVENOUS
  Filled 2023-04-04: qty 1

## 2023-04-04 MED ORDER — IOHEXOL 300 MG/ML  SOLN
100.0000 mL | Freq: Once | INTRAMUSCULAR | Status: AC | PRN
  Administered 2023-04-04: 100 mL via INTRAVENOUS

## 2023-04-04 MED ORDER — SODIUM CHLORIDE 0.9 % IV BOLUS
1000.0000 mL | Freq: Once | INTRAVENOUS | Status: AC
  Administered 2023-04-04: 1000 mL via INTRAVENOUS

## 2023-04-04 NOTE — ED Notes (Signed)
Report given to the next RN... 

## 2023-04-04 NOTE — ED Triage Notes (Signed)
Pt arrives to ED with c/o abdominal pain x2 weeks. She notes she was recently dx with pancreatitis by PCP.

## 2023-04-04 NOTE — Discharge Instructions (Signed)
You have been seen today for your complaint of left-sided abdominal pain. Your lab work was reassuring.  Your pancreas enzyme is slowly going down. Your imaging was reassuring and showed no acute emergent abnormalities. Your discharge medications include your home medications.  Continue to take Reglan as needed for nausea. Follow up with: Your primary care provider within the next 4 days for reevaluation Please seek immediate medical care if you develop any of the following symptoms: You cannot stop vomiting. Your pain is only in one part of the abdomen. Pain on the right side could be caused by appendicitis. You have bloody or black poop (stool), or poop that looks like tar. You have trouble breathing. You have chest pain. At this time there does not appear to be the presence of an emergent medical condition, however there is always the potential for conditions to change. Please read and follow the below instructions.  Do not take your medicine if  develop an itchy rash, swelling in your mouth or lips, or difficulty breathing; call 911 and seek immediate emergency medical attention if this occurs.  You may review your lab tests and imaging results in their entirety on your MyChart account.  Please discuss all results of fully with your primary care provider and other specialist at your follow-up visit.  Note: Portions of this text may have been transcribed using voice recognition software. Every effort was made to ensure accuracy; however, inadvertent computerized transcription errors may still be present.

## 2023-04-04 NOTE — ED Notes (Signed)
Pt aware of the need for a urine... Unable to currently collect.... 

## 2023-04-04 NOTE — ED Notes (Signed)
All appropriate discharge materials reviewed at length with patient. Time for questions provided. Pt has no other questions at this time and verbalizes understanding of all provided materials.  

## 2023-04-04 NOTE — ED Provider Notes (Signed)
[place] EMERGENCY DEPARTMENT AT [facility] Provider Note   CSN: [id number] Arrival date & time: 04/04/23  1018     History  Chief Complaint  Patient presents with   Abdominal Pain    Nicole Melendez is a 47 y.o. female.  With a significant history including but not limited to GERD, anxiety, depression, hyperlipidemia, type 2 diabetes, COPD who presents to the ED for evaluation of left-sided abdominal pain.  States this pain began 2 weeks ago.  She was diagnosed with pancreatitis on Friday by her PCP.  She states that she has had some vomiting as well for the last 2 weeks.  This is typically postprandial.  She takes Phenergan with some improvement in her symptoms but still vomited today.  She called her PCP again and was encouraged to come to the ED for further evaluation.  She denies any fevers or chills, melena, hematochezia, hematemesis.  States she struggles with constipation but takes MiraLAX and typically has successful bowel movements after that.  States she does have a history of diverticulitis.  She denies urinary symptoms.  She states she recently started exercising more often and initially thought that the pain was secondary to a muscle strain.   Abdominal Pain Associated symptoms: nausea and vomiting        Home Medications Prior to Admission medications   Medication Sig Start Date End Date Taking? Authorizing Provider  acetaminophen (TYLENOL) 650 MG CR tablet Take 650-1,300 mg by mouth every 8 (eight) hours as needed for pain.    [provider]  albuterol (VENTOLIN HFA) 108 (90 Base) MCG/ACT inhaler INHALE 2 PUFFS INTO THE LUNGS EVERY 4 HOURS AS NEEDED FOR WHEEZING OR SHORTNESS OF BREATH 03/31/20   Byrum, Les Pou, MD  ALPRAZolam Prudy Feeler) 1 MG tablet Take 1 tablet (1 mg total) by mouth daily as needed for anxiety. 12/22/22   [name], MD  azelastine (ASTELIN) 0.1 % nasal spray Place 2 sprays into both nostrils 2 (two) times daily. Use in each nostril  as directed 04/01/21   [name], MD  BD PEN NEEDLE NANO 2ND GEN 32G X 4 MM MISC USE AS DIRECTED THREE TIMES DAILY 03/17/21   [name], MD  Blood Glucose Monitoring Suppl [facility] VERIO) w/Device KIT Use as directed daily E11.9 03/25/23   [name], MD  clindamycin (CLEOCIN) 150 MG capsule Take 150 mg by mouth 4 (four) times daily. 01/10/23   [provider]  gabapentin (NEURONTIN) 300 MG capsule 3 tab by mouth three times daily 01/06/23   [name], MD  glucose blood [facility] VERIO) test strip USE AS DIRECTED THREE TIMES DAILY E11.9 03/22/23   [name], MD  insulin aspart (NOVOLOG FLEXPEN) 100 UNIT/ML FlexPen ADMINISTER 12 UNITS UNDER THE SKIN FOUR TIMES DAILY 03/22/23   [name], MD  levalbuterol [facility] HFA) 45 MCG/ACT inhaler Inhale 1-2 puffs into the lungs every 8 (eight) hours as needed for wheezing or shortness of breath. USE 2 PUFFS 3 TIMES DAILY X 5 DAYS THEN BACK TO HOME REGIMEN AS NEEDED. 03/26/21   Leslye Peer, MD  mometasone-formoterol (DULERA) 200-5 MCG/ACT AERO Inhale 2 puffs into the lungs in the morning and at bedtime. 03/26/21   Leslye Peer, MD  nystatin (MYCOSTATIN) 100000 UNIT/ML suspension TAKE 5 MLS BY MOUTH 4(FOUR) TIMES DAILY. SWISH FOR 30 SECONDS AND SWALLOW 03/24/21   Rodolph Bong, MD  ondansetron (ZOFRAN ODT) 4 MG disintegrating tablet Take 1  tablet (4 mg total) by mouth every 8 (eight) hours as needed for nausea or vomiting. 12/27/22   Drema Dallas, DO  oxyCODONE-acetaminophen (PERCOCET) 10-325 MG tablet Take 1 tablet by mouth every 4 (four) hours as needed for pain. 01/15/22   Beverely Low, MD  [facility]) 3 MG TABS 1 tab by mouth once daily 03/22/23   [name], MD  Rimegepant Sulfate (NURTEC) 75 MG TBDP 1 tab by mouth once daily as needed 03/22/23   [name], MD  rizatriptan (MAXALT-MLT) 10 MG disintegrating tablet Take 1 tablet earliest onset of migraine.  May repeat in 2 hours if needed.  Maximum 2 tablets in 24 hours  12/27/22   Drema Dallas, DO  rosuvastatin (CRESTOR) 40 MG tablet 1 tab by mouth once daily 02/12/22   [name], MD  Semaglutide, 2 MG/DOSE, 8 MG/3ML SOPN Inject 2 mg as directed once a week. 03/22/23   [name], MD  sertraline (ZOLOFT) 100 MG tablet Take 1 tablet (100 mg total) by mouth 2 (two) times daily. 03/01/22   [name], MD  telmisartan-hydrochlorothiazide (MICARDIS HCT) 40-12.5 MG tablet Take 1 tablet by mouth daily. 11/08/22   [name], MD  TURMERIC PO Take 1 tablet by mouth daily.    [provider]  vitamin B-12 (CYANOCOBALAMIN) 1000 MCG tablet Take 1,000 mcg by mouth daily.    [provider]  Vitamin D, Ergocalciferol, (DRISDOL) 1.25 MG (50000 UNIT) CAPS capsule Take by mouth. 02/08/20   [provider]  zonisamide (ZONEGRAN) 100 MG capsule Take 2 capsules (200 mg total) by mouth daily. 12/27/22   Drema Dallas, DO      Allergies    Azithromycin, Bee venom, Flagyl [metronidazole], Penicillins, Shellfish allergy, Klonopin [clonazepam], Cephalexin, and Cephalosporins    Review of Systems   Review of Systems  Gastrointestinal:  Positive for abdominal pain, nausea and vomiting.  All other systems reviewed and are negative.   Physical Exam Updated Vital Signs BP 118/86   Pulse 74   Temp 98 F (36.7 C) (Oral)   Resp 19   Ht 5\' 7"  (1.702 m)   Wt 135.2 kg   LMP 07/18/2011   SpO2 100%   BMI 46.67 kg/m  Physical Exam Vitals and nursing note reviewed.  Constitutional:      General: She is not in acute distress.    Appearance: She is well-developed.     Comments: Resting comfortably in bed  HENT:     Head: Normocephalic and atraumatic.  Eyes:     Conjunctiva/sclera: Conjunctivae normal.  Cardiovascular:     Rate and Rhythm: Normal rate and regular rhythm.     Heart sounds: No murmur heard. Pulmonary:     Effort: Pulmonary effort is normal. No respiratory distress.     Breath sounds: Normal breath sounds.  Abdominal:     General:  Abdomen is protuberant.     Palpations: Abdomen is soft.     Tenderness: There is abdominal tenderness in the left upper quadrant and left lower quadrant. There is no guarding or rebound.     Comments: No rashes  Musculoskeletal:        General: No swelling.     Cervical back: Neck supple.  Skin:    General: Skin is warm and dry.     Capillary Refill: Capillary refill takes less than 2 seconds.  Neurological:     Mental Status: She is alert.  Psychiatric:  Mood and Affect: Mood normal.     ED Results / Procedures / Treatments   Labs (all labs ordered are listed, but only abnormal results are displayed) Labs Reviewed  LIPASE, BLOOD - Abnormal; Notable for the following components:      Result Value   Lipase 86 (*)    All other components within normal limits  COMPREHENSIVE METABOLIC PANEL - Abnormal; Notable for the following components:   Glucose, Bld 131 (*)    AST 10 (*)    All other components within normal limits  CBC  URINALYSIS, ROUTINE W REFLEX MICROSCOPIC    EKG None  Radiology CT ABDOMEN PELVIS W CONTRAST  Result Date: 04/04/2023 CLINICAL DATA:  Left lower quadrant abdominal pain for 2 weeks. Recent diagnosis of pancreatitis by PCP. History of hernia repair and hysterectomy. EXAM: CT ABDOMEN AND PELVIS WITH CONTRAST TECHNIQUE: Multidetector CT imaging of the abdomen and pelvis was performed using the standard protocol following bolus administration of intravenous contrast. RADIATION DOSE REDUCTION: This exam was performed according to the departmental dose-optimization program which includes automated exposure control, adjustment of the mA and/or kV according to patient size and/or use of iterative reconstruction technique. CONTRAST:  OMNIPAQUE IOHEXOL 300 MG/ML  SOLN COMPARISON:  CT abdomen pelvis 11/21/2015 FINDINGS: Lower chest: The long bases unremarkable. Hepatobiliary: Smooth liver contours. No focal liver lesion is seen. The gallbladder is  unremarkable. No intrahepatic or extrahepatic biliary ductal dilatation. Surgical clips are again seen within the upper abdomen and a single surgical clip is seen in the left hepatic lobe, likely related to prior gastric surgery which appears to be a gastric sleeve procedure. Pancreas: Unremarkable. No pancreatic ductal dilatation or surrounding inflammatory changes. Spleen: Normal in size without focal abnormality. Adrenals/Urinary Tract: Normal adrenals. Unchanged developmental variant incomplete rotation of the right kidney. Thickening enhance uniformly and are symmetric in size without hydronephrosis. The urinary bladder is decompressed, limiting evaluation. The ureters appear to be normal in caliber. Stomach/Bowel: A few small diverticula within the distal ileum are again noted (coronal series 5 images 64 through 66). The appendix is not confidently identified, however no inflammatory changes are seen around the cecum to indicate secondary signs of acute appendicitis. Status post gastric surgery, likely gastric sleeve surgery. No dilated loops of bowel to indicate bowel obstruction. Vascular/Lymphatic: No abdominal aortic aneurysm. The major intra-aortic branch vessels are patent. There is unchanged focal short segment fusiform dilatation of the right superficial femoral artery measuring up to 2.8 x 2.6 cm (transverse by AP, coronal image 73 and sagittal image 63). No mesenteric, retroperitoneal, or pelvic lymphadenopathy. Reproductive: The uterus is surgically absent. No adnexal mass is seen. Other: Unchanged fat containing umbilical hernia with orifice measuring up to 3.4 x 2.8 cm (transverse by AP). No free air or free fluid is seen within the abdomen or pelvis. Musculoskeletal: There is transitional lumbosacral anatomy. Partial lumbarization of S1. Mild anterior and posterior L5-S1 endplate osteophytes. Unchanged 4 mm grade 1 anterolisthesis of L5 on S1. Anterior bridging osteophytes are again seen from the  T9-10 through T11-12 levels. IMPRESSION: 1. No CT evidence of pancreatic inflammation to indicate acute pancreatitis. No acute intra-abdominal abnormality. 2. Status post gastric sleeve surgery. 3. Mild distal ileal diverticula without inflammatory changes to indicate diverticulosis. 4. Unchanged moderate fat containing umbilical hernia. 5. Unchanged focal short segment fusiform dilatation of the right superficial femoral artery measuring up to 2.8 x 2.6 cm. Electronically Signed   By: Neita Garnet M.D.   On: 04/04/2023 13:17  Procedures Procedures    Medications Ordered in ED Medications  sodium chloride 0.9 % bolus 1,000 mL (0 mLs Intravenous Stopped 04/04/23 1204)  ondansetron (ZOFRAN) injection 4 mg (4 mg Intravenous Given 04/04/23 1109)  iohexol (OMNIPAQUE) 300 MG/ML solution 100 mL (100 mLs Intravenous Contrast Given 04/04/23 1202)  morphine (PF) 4 MG/ML injection 4 mg (4 mg Intravenous Given 04/04/23 1302)    ED Course/ Medical Decision Making/ A&P                             Medical Decision Making Amount and/or Complexity of Data Reviewed Labs: ordered. Radiology: ordered.  Risk Prescription drug management.  This patient presents to the ED for concern of left-sided abdominal pain, this involves an extensive number of treatment options, and is a complaint that carries with it a high risk of complications and morbidity. The differential diagnosis for generalized abdominal pain includes, but is not limited to AAA, gastroenteritis, appendicitis, Bowel obstruction, Bowel perforation. Gastroparesis, DKA, Hernia, Inflammatory bowel disease, mesenteric ischemia, pancreatitis, peritonitis SBP, volvulus.   Co morbidities that complicate the patient evaluation   significant history including but not limited to GERD, anxiety, depression, hyperlipidemia, type 2 diabetes, COPD   My initial workup includes labs, fluids, nausea control, CT abdomen pelvis  Additional history obtained  from: Nursing notes from this visit.  I ordered, reviewed and interpreted labs which include: CBC, CMP, lipase, urinalysis.  Hyperglycemia of 131.  Lipase elevated to 86.  I ordered imaging studies including CT abdomen pelvis I independently visualized and interpreted imaging which showed normal pancreas.  SP gastric sleeve surgery.  Mild distal ileal diverticula without evidence of diverticulitis.  Unchanged fat-containing umbilical hernia I agree with the radiologist interpretation  Afebrile, hemodynamically stable.  47 year old female presenting to the ED for evaluation of left-sided abdominal pain.  She states this has been present for the past 2 weeks.  Was initially concerned about a muscular injury as she recently started exercising.  She is obese on physical exam but is in no acute distress.  She has some mild abdominal TTP to the entire left half of her abdomen.  She denies any urinary symptoms.  Denies bowel abnormalities.  CT abdomen pelvis was overall reassuring.  No evidence of diverticulitis.  No acute intra-abdominal abnormalities.  Her lipase was mildly elevated today.  She does use Ozempic.  She reported improvement in her symptoms after treatment in the ED.  She was able to tolerate p.o. intake without difficulty.  Unclear etiology of her abdominal pain, however muscular injury is high on the differential.  She was encouraged to follow-up with her PCP within the next 5 days for reevaluation of her symptoms.  She was given return precautions.  Stable at discharge.  At this time there does not appear to be any evidence of an acute emergency medical condition and the patient appears stable for discharge with appropriate outpatient follow up. Diagnosis was discussed with patient who verbalizes understanding of care plan and is agreeable to discharge. I have discussed return precautions with patient who verbalizes understanding. Patient encouraged to follow-up with their PCP within 1 week.  All questions answered.  Note: Portions of this report may have been transcribed using voice recognition software. Every effort was made to ensure accuracy; however, inadvertent computerized transcription errors may still be present.        Final Clinical Impression(s) / ED Diagnoses Final diagnoses:  Left lower quadrant abdominal pain  Rx / DC Orders ED Discharge Orders     None         Michelle Piper, Cordelia Poche 04/04/23 1450    Melene Plan, [place] 04/05/23 [phone]

## 2023-04-04 NOTE — ED Notes (Signed)
Pt requested pain meds... Provider informed... Pt was going to let me know if she had a ride bc that would determine the meds the provider might give... Waiting on pt for a yes/no.Marland KitchenMarland Kitchen

## 2023-04-08 ENCOUNTER — Other Ambulatory Visit: Payer: Self-pay

## 2023-04-08 MED ORDER — SERTRALINE HCL 100 MG PO TABS: 100.0000 mg | ORAL_TABLET | Freq: Two times a day (BID) | ORAL | 11 refills | Status: DC

## 2023-04-13 ENCOUNTER — Ambulatory Visit: Payer: Medicaid Other | Admitting: Internal Medicine

## 2023-04-13 ENCOUNTER — Encounter: Payer: Self-pay | Admitting: Internal Medicine

## 2023-04-13 VITALS — BP 122/80 | HR 67 | Temp 98.3°F | Ht 67.0 in | Wt 296.0 lb

## 2023-04-13 DIAGNOSIS — K219 Gastro-esophageal reflux disease without esophagitis: Secondary | ICD-10-CM

## 2023-04-13 DIAGNOSIS — K859 Acute pancreatitis without necrosis or infection, unspecified: Secondary | ICD-10-CM

## 2023-04-13 DIAGNOSIS — E1140 Type 2 diabetes mellitus with diabetic neuropathy, unspecified: Secondary | ICD-10-CM | POA: Diagnosis not present

## 2023-04-13 DIAGNOSIS — E559 Vitamin D deficiency, unspecified: Secondary | ICD-10-CM | POA: Diagnosis not present

## 2023-04-13 DIAGNOSIS — Z794 Long term (current) use of insulin: Secondary | ICD-10-CM | POA: Diagnosis not present

## 2023-04-13 MED ORDER — SEMAGLUTIDE (1 MG/DOSE) 4 MG/3ML ~~LOC~~ SOPN: 1.0000 mg | PEN_INJECTOR | SUBCUTANEOUS | 11 refills | Status: DC

## 2023-04-13 MED ORDER — PANTOPRAZOLE SODIUM 40 MG PO TBEC
40.0000 mg | DELAYED_RELEASE_TABLET | Freq: Every day | ORAL | 3 refills | Status: DC
Start: 2023-04-13 — End: 2023-08-17

## 2023-04-13 NOTE — Patient Instructions (Signed)
Ok to restart the Protonix 40 mg per day  Please continue all other medications as before, including the ozempic at 1 mg weekly  Please have the pharmacy call with any other refills you may need.  Please keep your appointments with your specialists as you may have planned  You will be contacted regarding the referral for: Gastroenterology

## 2023-04-13 NOTE — Progress Notes (Signed)
Patient ID: Nicole Melendez, female   DOB: Sep 22, 1976, 47 y.o.   MRN: [id number]        Chief Complaint: follow up acute pancreatitis, dm, gerd, low vit d       HPI:  Nicole Melendez is a 47 y.o. female here with recent episode acute pancreatitis with mild elevated lipase on ozempic, which has occurred previously without ozempic.  Pt was advised to d/c ozempic, but she had 1 mg dosing at home, so has been taking this instead of the 2 mg to help with wt loss.  Has aldo had mild worsening reflux, dysphagia, n/v, bowel change or blood and abd pain seems to have resolved.   Pt denies polydipsia, polyuria, or new focal neuro s/s.    Pt denies fever, wt loss, night sweats, loss of appetite, or other constitutional symptoms         Wt Readings from Last 3 Encounters:  04/13/23 296 lb (134.3 kg)  04/04/23 298 lb (135.2 kg)  03/22/23 (!) 302 lb (137 kg)   BP Readings from Last 3 Encounters:  04/13/23 122/80  04/04/23 118/86  03/22/23 126/80         Past Medical History:  Diagnosis Date   Allergic rhinitis, cause unspecified    Anemia    Anxiety    Arthritis    COPD (chronic obstructive pulmonary disease) (HCC)    Depression    Diabetes mellitus type II    steroid related, patient states "im no longer diabetic"   Essential hypertension 08/08/2015   GERD (gastroesophageal reflux disease)    History of blood transfusion    Hyperlipidemia    Menorrhagia    Migraine    Morbid obesity (HCC)    Otitis media 06/28/2015   Peptic ulcer disease    Pneumonia    Positive ANA (antinuclear antibody) 02/14/2012   Sarcoid    including hand per rheumatology-Dr. 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(HCC)    Shortness of breath    on exertion   Varicose veins with pain    Past Surgical History:  Procedure Laterality Date   ABDOMINAL HYSTERECTOMY     COLONOSCOPY WITH PROPOFOL N/A 10/19/2016   Procedure: COLONOSCOPY WITH PROPOFOL;  Surgeon: Napoleon Form, MD;  Location: [facility];  Service:  Endoscopy;  Laterality: N/A;   ESOPHAGOGASTRODUODENOSCOPY (EGD) WITH PROPOFOL N/A 10/19/2016   Procedure: ESOPHAGOGASTRODUODENOSCOPY (EGD) WITH PROPOFOL;  Surgeon: Napoleon Form, MD;  Location: [facility];  Service: Endoscopy;  Laterality: N/A;   HERNIA MESH REMOVAL  02/2013   HERNIA REPAIR     KNEE ARTHROSCOPY Left 01/15/2022   Procedure: ARTHROSCOPY KNEE;  Surgeon: Beverely Low, MD;  Location: [facility];  Service: Orthopedics;  Laterality: Left;   uterine ablation  03/2010   WISDOM TOOTH EXTRACTION      reports that she quit smoking about 10 years ago. Her smoking use included cigarettes. She has a 20.00 pack-year smoking history. She has never used smokeless tobacco. She reports that she does not drink alcohol and does not use drugs. family history includes Allergies in her mother; Bone cancer in her maternal aunt; Breast cancer in her maternal aunt; Diabetes in her father and mother; Heart attack in her mother; Heart disease in her father; Hypertension in an other family member; Lung cancer in her maternal aunt; Ovarian cancer in her maternal aunt; Rheum arthritis in her father; Stroke in her father and maternal uncle. Allergies  Allergen Reactions   Azithromycin Hives    (  z pak) hives   Bee Venom Anaphylaxis   Flagyl [Metronidazole] Hives and Shortness Of Breath   Penicillins Shortness Of Breath and Swelling    Has patient had a PCN reaction causing immediate rash, facial/tongue/throat swelling, SOB or lightheadedness with hypotension: Yes Has patient had a PCN reaction causing severe rash involving mucus membranes or skin necrosis: No Has patient had a PCN reaction that required hospitalization No Has patient had a PCN reaction occurring within the last 10 years: No If all of the above answers are "NO", then may proceed with Cephalosporin use.  REACTION: swelling and difficulty breathing   Shellfish Allergy Anaphylaxis   Klonopin [Clonazepam] Other (See Comments)    Memory  difficulty   Cephalexin Hives and Swelling   Cephalosporins Hives   Current Outpatient Medications on File Prior to Visit  Medication Sig Dispense Refill   acetaminophen (TYLENOL) 650 MG CR tablet Take 650-1,300 mg by mouth every 8 (eight) hours as needed for pain.     albuterol (VENTOLIN HFA) 108 (90 Base) MCG/ACT inhaler INHALE 2 PUFFS INTO THE LUNGS EVERY 4 HOURS AS NEEDED FOR WHEEZING OR SHORTNESS OF BREATH 8.5 g 0   ALPRAZolam (XANAX) 1 MG tablet Take 1 tablet (1 mg total) by mouth daily as needed for anxiety. 30 tablet 5   azelastine (ASTELIN) 0.1 % nasal spray Place 2 sprays into both nostrils 2 (two) times daily. Use in each nostril as directed 30 mL 12   BD PEN NEEDLE NANO 2ND GEN 32G X 4 MM MISC USE AS DIRECTED THREE TIMES DAILY 300 each 3   Blood Glucose Monitoring Suppl (ONETOUCH VERIO) w/Device KIT Use as directed daily E11.9 1 kit 0   clindamycin (CLEOCIN) 150 MG capsule Take 150 mg by mouth 4 (four) times daily.     gabapentin (NEURONTIN) 300 MG capsule 3 tab by mouth three times daily 270 capsule 1   glucose blood (ONETOUCH VERIO) test strip USE AS DIRECTED THREE TIMES DAILY E11.9 300 strip 11   insulin aspart (NOVOLOG FLEXPEN) 100 UNIT/ML FlexPen ADMINISTER 12 UNITS UNDER THE SKIN FOUR TIMES DAILY 15 mL 2   levalbuterol (XOPENEX HFA) 45 MCG/ACT inhaler Inhale 1-2 puffs into the lungs every 8 (eight) hours as needed for wheezing or shortness of breath. USE 2 PUFFS 3 TIMES DAILY X 5 DAYS THEN BACK TO HOME REGIMEN AS NEEDED. 1 each 0   mometasone-formoterol (DULERA) 200-5 MCG/ACT AERO Inhale 2 puffs into the lungs in the morning and at bedtime. 8.8 g 5   nystatin (MYCOSTATIN) 100000 UNIT/ML suspension TAKE 5 MLS BY MOUTH 4(FOUR) TIMES DAILY. SWISH FOR 30 SECONDS AND SWALLOW 180 mL 0   ondansetron (ZOFRAN ODT) 4 MG disintegrating tablet Take 1 tablet (4 mg total) by mouth every 8 (eight) hours as needed for nausea or vomiting. 30 tablet 11   oxyCODONE-acetaminophen (PERCOCET) 10-325  MG tablet Take 1 tablet by mouth every 4 (four) hours as needed for pain. 30 tablet 0   Plecanatide (TRULANCE) 3 MG TABS 1 tab by mouth once daily 90 tablet 3   Rimegepant Sulfate (NURTEC) 75 MG TBDP 1 tab by mouth once daily as needed 16 tablet 11   rizatriptan (MAXALT-MLT) 10 MG disintegrating tablet Take 1 tablet earliest onset of migraine.  May repeat in 2 hours if needed.  Maximum 2 tablets in 24 hours 9 tablet 11   rosuvastatin (CRESTOR) 40 MG tablet 1 tab by mouth once daily 30 tablet 11   sertraline (ZOLOFT) 100 MG tablet  Take 1 tablet (100 mg total) by mouth 2 (two) times daily. 60 tablet 11   telmisartan-hydrochlorothiazide (MICARDIS HCT) 40-12.5 MG tablet Take 1 tablet by mouth daily. 30 tablet 2   TURMERIC PO Take 1 tablet by mouth daily.     vitamin B-12 (CYANOCOBALAMIN) 1000 MCG tablet Take 1,000 mcg by mouth daily.     Vitamin D, Ergocalciferol, (DRISDOL) 1.25 MG (50000 UNIT) CAPS capsule Take by mouth.     zonisamide (ZONEGRAN) 100 MG capsule Take 2 capsules (200 mg total) by mouth daily. 180 capsule 3   No current facility-administered medications on file prior to visit.        ROS:  All others reviewed and negative.  Objective        PE:  BP 122/80 (BP Location: Right Arm, Patient Position: Sitting, Cuff Size: Normal)   Pulse 67   Temp 98.3 F (36.8 C) (Oral)   Ht 5\' 7"  (1.702 m)   Wt 296 lb (134.3 kg)   LMP 07/18/2011   SpO2 99%   BMI 46.36 kg/m                 Constitutional: Pt appears in NAD               HENT: Head: NCAT.                Right Ear: External ear normal.                 Left Ear: External ear normal.                Eyes: . Pupils are equal, round, and reactive to light. Conjunctivae and EOM are normal               Nose: without d/c or deformity               Neck: Neck supple. Gross normal ROM               Cardiovascular: Normal rate and regular rhythm.                 Pulmonary/Chest: Effort normal and breath sounds without rales or  wheezing.                Abd:  Soft, NT, ND, + BS, no organomegaly               Neurological: Pt is alert. At baseline orientation, motor grossly intact               Skin: Skin is warm. No rashes, no other new lesions, LE edema - none               Psychiatric: Pt behavior is normal without agitation   Micro: none  Cardiac tracings I have personally interpreted today:  none  Pertinent Radiological findings (summarize): none   Lab Results  Component Value Date   WBC 8.3 04/04/2023   HGB 13.0 04/04/2023   HCT 38.9 04/04/2023   PLT 354 04/04/2023   GLUCOSE 131 (H) 04/04/2023   CHOL 155 12/22/2022   TRIG 87.0 12/22/2022   HDL 50.60 12/22/2022   LDLDIRECT 135.0 07/05/2011   LDLCALC 87 12/22/2022   ALT 7 04/04/2023   AST 10 (L) 04/04/2023   NA 135 04/04/2023   K 3.5 04/04/2023   CL 100 04/04/2023   CREATININE 0.67 04/04/2023   BUN 10 04/04/2023   CO2 25 04/04/2023   TSH 1.69 12/22/2022  INR 1.3 11/17/2012   HGBA1C 6.2 (A) 03/22/2023   MICROALBUR <0.7 12/22/2022   Assessment/Plan:  Nicole Melendez is a 47 y.o. Black or African American [2] female with  has a past medical history of Allergic rhinitis, cause unspecified, Anemia, Anxiety, Arthritis, COPD (chronic obstructive pulmonary disease) (HCC), Depression, Diabetes mellitus type II, Essential hypertension (08/08/2015), GERD (gastroesophageal reflux disease), History of blood transfusion, Hyperlipidemia, Menorrhagia, Migraine, Morbid obesity (HCC), Otitis media (06/28/2015), Peptic ulcer disease, Pneumonia, Positive ANA (antinuclear antibody) (02/14/2012), Sarcoid, Sarcoidosis of lung (HCC), Shortness of breath, and Varicose veins with pain.  Diabetes mellitus with neuropathy (HCC) Lab Results  Component Value Date   HGBA1C 6.2 (A) 03/22/2023   Stable, pt to continue current medical treatment novolog 12 u qid, and decreased ozempic to 1 mg weekly   Gastroesophageal reflux disease Mild worsening, for restart  PPI  Pancreatitis Possibly recurrent, not likely related to ozempic it seems, for referral GI  Vitamin D deficiency Last vitamin D Lab Results  Component Value Date   VD25OH 14.37 (L) 12/22/2022   Low, to start oral replacement  Followup: Return if symptoms worsen or fail to improve.  [name], MD 04/16/2023 4:51 PM [facility]

## 2023-04-16 ENCOUNTER — Encounter: Payer: Self-pay | Admitting: Internal Medicine

## 2023-04-16 NOTE — Assessment & Plan Note (Signed)
Lab Results  Component Value Date   HGBA1C 6.2 (A) 03/22/2023   Stable, pt to continue current medical treatment novolog 12 u qid, and decreased ozempic to 1 mg weekly

## 2023-04-16 NOTE — Assessment & Plan Note (Signed)
Last vitamin D Lab Results  Component Value Date   VD25OH 14.37 (L) 12/22/2022   Low, to start oral replacement  

## 2023-04-16 NOTE — Assessment & Plan Note (Signed)
Possibly recurrent, not likely related to ozempic it seems, for referral GI

## 2023-04-16 NOTE — Assessment & Plan Note (Signed)
Mild worsening, for restart PPI

## 2023-04-19 ENCOUNTER — Encounter: Payer: Self-pay | Admitting: Nurse Practitioner

## 2023-04-20 ENCOUNTER — Encounter: Payer: Self-pay | Admitting: Internal Medicine

## 2023-04-20 ENCOUNTER — Other Ambulatory Visit: Payer: Self-pay | Admitting: Internal Medicine

## 2023-04-20 MED ORDER — ALPRAZOLAM 1 MG PO TABS: 1.0000 mg | ORAL_TABLET | Freq: Every day | ORAL | 5 refills | Status: DC | PRN

## 2023-04-20 MED ORDER — GABAPENTIN 300 MG PO CAPS: ORAL_CAPSULE | ORAL | 1 refills | Status: DC

## 2023-04-21 MED ORDER — ALPRAZOLAM 0.5 MG PO TABS: 0.5000 mg | ORAL_TABLET | Freq: Two times a day (BID) | ORAL | 1 refills | Status: DC | PRN

## 2023-04-21 MED ORDER — ROSUVASTATIN CALCIUM 40 MG PO TABS: ORAL_TABLET | ORAL | 3 refills | Status: DC

## 2023-04-21 NOTE — Telephone Encounter (Signed)
Ok this is done 

## 2023-04-21 NOTE — Addendum Note (Signed)
Addended by: [name] on: 04/21/2023 12:09 PM   Modules accepted: Orders

## 2023-05-07 DIAGNOSIS — Z6841 Body Mass Index (BMI) 40.0 and over, adult: Secondary | ICD-10-CM | POA: Diagnosis not present

## 2023-05-07 DIAGNOSIS — F411 Generalized anxiety disorder: Secondary | ICD-10-CM | POA: Diagnosis not present

## 2023-05-07 DIAGNOSIS — G8929 Other chronic pain: Secondary | ICD-10-CM | POA: Diagnosis not present

## 2023-05-07 DIAGNOSIS — M539 Dorsopathy, unspecified: Secondary | ICD-10-CM | POA: Diagnosis not present

## 2023-05-07 DIAGNOSIS — M5440 Lumbago with sciatica, unspecified side: Secondary | ICD-10-CM | POA: Diagnosis not present

## 2023-05-07 DIAGNOSIS — Z79899 Other long term (current) drug therapy: Secondary | ICD-10-CM | POA: Diagnosis not present

## 2023-05-07 DIAGNOSIS — F410 Panic disorder [episodic paroxysmal anxiety] without agoraphobia: Secondary | ICD-10-CM | POA: Diagnosis not present

## 2023-05-07 DIAGNOSIS — M7061 Trochanteric bursitis, right hip: Secondary | ICD-10-CM | POA: Diagnosis not present

## 2023-05-07 DIAGNOSIS — E119 Type 2 diabetes mellitus without complications: Secondary | ICD-10-CM | POA: Diagnosis not present

## 2023-05-11 ENCOUNTER — Ambulatory Visit: Payer: Medicaid Other | Admitting: Emergency Medicine

## 2023-05-11 ENCOUNTER — Encounter: Payer: Self-pay | Admitting: Emergency Medicine

## 2023-05-11 VITALS — BP 126/74 | HR 83 | Temp 98.5°F | Ht 67.0 in | Wt 269.0 lb

## 2023-05-11 DIAGNOSIS — D860 Sarcoidosis of lung: Secondary | ICD-10-CM

## 2023-05-11 DIAGNOSIS — J301 Allergic rhinitis due to pollen: Secondary | ICD-10-CM | POA: Diagnosis not present

## 2023-05-11 DIAGNOSIS — J449 Chronic obstructive pulmonary disease, unspecified: Secondary | ICD-10-CM | POA: Diagnosis not present

## 2023-05-11 DIAGNOSIS — G4733 Obstructive sleep apnea (adult) (pediatric): Secondary | ICD-10-CM

## 2023-05-11 DIAGNOSIS — R0683 Snoring: Secondary | ICD-10-CM | POA: Diagnosis not present

## 2023-05-11 MED ORDER — MOMETASONE FURO-FORMOTEROL FUM 200-5 MCG/ACT IN AERO: 2.0000 | INHALATION_SPRAY | Freq: Two times a day (BID) | RESPIRATORY_TRACT | 5 refills | Status: DC

## 2023-05-11 MED ORDER — LEVALBUTEROL TARTRATE 45 MCG/ACT IN AERO: 1.0000 | INHALATION_SPRAY | Freq: Three times a day (TID) | RESPIRATORY_TRACT | 0 refills | Status: DC | PRN

## 2023-05-11 NOTE — Patient Instructions (Signed)
We will refill your Dulera.  Please take 2 puffs twice daily.  Rinse and gargle after using. Keep your Xopenex available use 2 puffs up to every 6 hours if needed for shortness of breath, chest tightness, wheezing. Continue your loratadine 10 mg once daily. Restart fluticasone nasal spray, 2 sprays each nostril once daily.  We will send a prescription for this. We will perform a CT scan of the chest without contrast to follow her sarcoidosis. We will arrange for pulmonary function testing to compare with your priors. We will arrange for a split-night sleep study to evaluate for suspected sleep apnea, consider starting CPAP treatment. Follow Dr. Delton Coombes in 3 months to review your studies.

## 2023-05-11 NOTE — Assessment & Plan Note (Signed)
Continue your loratadine 10 mg once daily. Restart fluticasone nasal spray, 2 sprays each nostril once daily.  We will send a prescription for this.

## 2023-05-11 NOTE — Assessment & Plan Note (Signed)
We will arrange for a split-night sleep study to evaluate for suspected sleep apnea, consider starting CPAP treatment.

## 2023-05-11 NOTE — Assessment & Plan Note (Signed)
We will refill your Dulera.  Please take 2 puffs twice daily.  Rinse and gargle after using. Keep your Xopenex available use 2 puffs up to every 6 hours if needed for shortness of breath, chest tightness, wheezing.

## 2023-05-11 NOTE — Assessment & Plan Note (Signed)
We will perform a CT scan of the chest without contrast to follow her sarcoidosis. We will arrange for pulmonary function testing to compare with your priors. Follow Dr. Delton Coombes in 3 months to review your studies.

## 2023-05-11 NOTE — Progress Notes (Signed)
Subjective:    Patient ID: Nicole Melendez, female    DOB: 27-Apr-1976, 47 y.o.   MRN: [id number]  HPI   ROV 03/26/21 --this is a follow-up visit for 47 year old woman, former smoker with a history of sarcoidosis and associated obstructive lung disease. Reports that she had COVID in 11/21 and 1/22.  She was formally on methotrexate.  I last saw her about a year and a half ago.  She has been maintained on Dulera but ran out 2 months ago, uses Xopenex usually rarely, but for the last 2 months she is using 2-3x a day.  Most recent spirometry 05/04/2019.  Most recent CT chest 01/04/2019.  On allergy regimen > zyrtec, out of flonase and astelin.   ROV 10/30/21 --[name] follows up today for her history of obstructive lung disease in the setting of former tobacco and also sarcoidosis.  She has been on methotrexate in the past.  We repeated pulmonary function testing after her last visit in June 2022 as below.  Repeat PFT have not been done. Her breathing is stable. She has gets white tongue patches, consistent w thrush, has some odynophagia. Remains on Dulera. She uses albuterol about 1x day. She has xopenex as well, but only substitutes it when she is having side effects from the albuterol.   CT chest 04/07/2021 reviewed by me shows stable interstitial changes compared with her prior scans most recently 12/2018.  No evidence of progression, groundglass or active inflammation.  She has scattered borderline mediastinal and hilar lymphadenopathy.    ROV 05/11/23 --follow-up visit 47 year old woman with a history of former tobacco use, sarcoidosis and associated asthma/COPD pattern.  She also has allergic rhinitis.  She has been on methotrexate in the past, not currently.  She has been managed on [facility] but she ran out about 3 months ago, uses Xopenex approximately 2x a month. She misses the [facility], more exertional SOB and some SOB and wheeze at night when supine.  Her most recent pulmonary function testing was  2020, most recent CT scan of the chest was 2022. She is snoring, wonders about OSA. She had a non-diagnostic PSG in 2014.    Review of Systems  As per HPI     Objective:   Physical Exam  Vitals:   05/11/23 1113  BP: 126/74  Pulse: 83  Temp: 98.5 F (36.9 C)  TempSrc: Oral  SpO2: 100%  Weight: 269 lb (122 kg)  Height: 5\' 7"  (1.702 m)   Gen: Pleasant, obese woman, in no distress,  normal affect  ENT: No lesions,  mouth clear,  oropharynx clear, no postnasal drip  Neck: No JVD, no stridor  Lungs: No use of accessory muscles, clear without rales or rhonchi, soft end expiratory wheeze on forced expiration Cardiovascular: RRR, heart sounds normal, no murmur or gallops, no peripheral edema  Musculoskeletal: No deformities, no cyanosis or clubbing  Neuro: alert, non focal  Skin: Warm, no lesions or rashes        Assessment & Plan:  Sarcoidosis of lung (HCC) We will perform a CT scan of the chest without contrast to follow her sarcoidosis. We will arrange for pulmonary function testing to compare with your priors. Follow Dr. Delton Coombes in 3 months to review your studies.  Allergic rhinitis Continue your loratadine 10 mg once daily. Restart fluticasone nasal spray, 2 sprays each nostril once daily.  We will send a prescription for this.  COPD (chronic obstructive pulmonary disease) (HCC) We will refill your Dulera.  Please take 2 puffs twice daily.  Rinse and gargle after using. Keep your Xopenex available use 2 puffs up to every 6 hours if needed for shortness of breath, chest tightness, wheezing.  Snoring We will arrange for a split-night sleep study to evaluate for suspected sleep apnea, consider starting CPAP treatment.   Levy Pupa, MD, PhD 05/11/2023, 12:14 PM [facility] [phone] or if no answer [phone]

## 2023-05-13 ENCOUNTER — Telehealth: Payer: Self-pay | Admitting: Emergency Medicine

## 2023-05-13 MED ORDER — PREDNISONE 10 MG PO TABS: ORAL_TABLET | ORAL | 0 refills | Status: AC

## 2023-05-13 MED ORDER — AMOXICILLIN-POT CLAVULANATE 875-125 MG PO TABS: 1.0000 | ORAL_TABLET | Freq: Two times a day (BID) | ORAL | 0 refills | Status: DC

## 2023-05-13 NOTE — Telephone Encounter (Signed)
Called and spoke with patient. Patient stated she started feeling some sinus congestion last night.  Patient stated she is now having sinus congestion, runny nose, chest congestion, wheezing, body aches, and was sent home from work for a temp of 101.  Patient has tested today at home for covid and test was negative.   Patient is requesting prescriptions to be sent to [facility].  Message routed to Dr. Delton Coombes to advise

## 2023-05-13 NOTE — Telephone Encounter (Signed)
Pt. Calling answering service is sick now and was seen 2 days ago and needs med advise

## 2023-05-13 NOTE — Telephone Encounter (Signed)
Called and spoke with patient. She verbalized understanding. RXs have sent to pharmacy.   Nothing further needed at time of call.

## 2023-05-13 NOTE — Telephone Encounter (Signed)
Please treat her with prednisone >> Take 40mg  daily for 3 days, then 30mg  daily for 3 days, then 20mg  daily for 3 days, then 10mg  daily for 3 days, then stop  Please treat her with Augmentin 875mg  bid x 7 days

## 2023-05-16 ENCOUNTER — Telehealth: Payer: Medicaid Other | Admitting: Family Medicine

## 2023-05-16 VITALS — Temp 101.0°F

## 2023-05-16 DIAGNOSIS — J069 Acute upper respiratory infection, unspecified: Secondary | ICD-10-CM

## 2023-05-16 DIAGNOSIS — J449 Chronic obstructive pulmonary disease, unspecified: Secondary | ICD-10-CM

## 2023-05-16 DIAGNOSIS — R52 Pain, unspecified: Secondary | ICD-10-CM

## 2023-05-16 NOTE — Progress Notes (Signed)
Virtual Visit via Video Note  I connected with Nicole Melendez on 05/16/23 at  1:15 PM EDT by a video enabled telemedicine application and verified that I am speaking with the correct person using two identifiers.  Location patient: home Location provider:work or home office Persons participating in the virtual visit: patient, provider  I discussed the limitations of evaluation and management by telemedicine and the availability of in person appointments. The patient expressed understanding and agreed to proceed. Chief Complaint  Patient presents with   Generalized Body Aches    Body aches, running a fever, is taking predisone and other prescribed meds. But not sure if she is contagious. Was not able t go to work today. Woke up at 430 and was in pain, is trying not to go to ED.      HPI: Pt is a 47 yo female followed by [name] and seen for acute concern.  Pt woke up this morning at 4:30 am with body aches.  Endorses URI symptoms that started Friday.  Pt given rx for amoxicillin and prednisone by Dr. Solon Augusta, Pulm.  Pt took 4 home COVID tests which were negative.  BS has not been high.  Pt states the rx medications have not been doing anything.  Pt states she is hurting all over.  Saw mucus in stools.  Has decreased appetite, fever, and cough which is improving.   Pt seen by pain management.  Taking Percocet 10 mg q 5 hrs.  Is able to increase frequency up to 6 x for acute pain.    ROS: See pertinent positives and negatives per HPI.  Past Medical History:  Diagnosis Date   Allergic rhinitis, cause unspecified    Anemia    Anxiety    Arthritis    COPD (chronic obstructive pulmonary disease) (HCC)    Depression    Diabetes mellitus type II    steroid related, patient states "im no longer diabetic"   Essential hypertension 08/08/2015   GERD (gastroesophageal reflux disease)    History of blood transfusion    Hyperlipidemia    Menorrhagia    Migraine    Morbid obesity (HCC)    Otitis  media 06/28/2015   Peptic ulcer disease    Pneumonia    Positive ANA (antinuclear antibody) 02/14/2012   Sarcoid    including hand per rheumatology-Dr. 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(HCC)    Shortness of breath    on exertion   Varicose veins with pain     Past Surgical History:  Procedure Laterality Date   ABDOMINAL HYSTERECTOMY     COLONOSCOPY WITH PROPOFOL N/A 10/19/2016   Procedure: COLONOSCOPY WITH PROPOFOL;  Surgeon: Napoleon Form, MD;  Location: [facility];  Service: Endoscopy;  Laterality: N/A;   ESOPHAGOGASTRODUODENOSCOPY (EGD) WITH PROPOFOL N/A 10/19/2016   Procedure: ESOPHAGOGASTRODUODENOSCOPY (EGD) WITH PROPOFOL;  Surgeon: Napoleon Form, MD;  Location: [facility];  Service: Endoscopy;  Laterality: N/A;   HERNIA MESH REMOVAL  02/2013   HERNIA REPAIR     KNEE ARTHROSCOPY Left 01/15/2022   Procedure: ARTHROSCOPY KNEE;  Surgeon: Beverely Low, MD;  Location: [facility];  Service: Orthopedics;  Laterality: Left;   uterine ablation  03/2010   WISDOM TOOTH EXTRACTION      Family History  Problem Relation Age of Onset   Allergies Mother    Heart attack Mother    Diabetes Mother    Diabetes Father    Heart disease Father    Rheum arthritis Father  Stroke Father    Hypertension Other    Ovarian cancer Maternal Aunt    Lung cancer Maternal Aunt    Breast cancer Maternal Aunt    Bone cancer Maternal Aunt    Stroke Maternal Uncle    Other Neg Hx     Current Outpatient Medications:    acetaminophen (TYLENOL) 650 MG CR tablet, Take 650-1,300 mg by mouth every 8 (eight) hours as needed for pain., Disp: , Rfl:    albuterol (VENTOLIN HFA) 108 (90 Base) MCG/ACT inhaler, INHALE 2 PUFFS INTO THE LUNGS EVERY 4 HOURS AS NEEDED FOR WHEEZING OR SHORTNESS OF BREATH, Disp: 8.5 g, Rfl: 0   ALPRAZolam (XANAX) 0.5 MG tablet, Take 1 tablet (0.5 mg total) by mouth 2 (two) times daily as needed for anxiety., Disp: 60 tablet, Rfl: 1   amoxicillin-clavulanate (AUGMENTIN) 875-125  MG tablet, Take 1 tablet by mouth 2 (two) times daily., Disp: 14 tablet, Rfl: 0   azelastine (ASTELIN) 0.1 % nasal spray, Place 2 sprays into both nostrils 2 (two) times daily. Use in each nostril as directed, Disp: 30 mL, Rfl: 12   BD PEN NEEDLE NANO 2ND GEN 32G X 4 MM MISC, USE AS DIRECTED THREE TIMES DAILY, Disp: 300 each, Rfl: 3   Blood Glucose Monitoring Suppl (ONETOUCH VERIO) w/Device KIT, Use as directed daily E11.9, Disp: 1 kit, Rfl: 0   clindamycin (CLEOCIN) 150 MG capsule, Take 150 mg by mouth 4 (four) times daily., Disp: , Rfl:    gabapentin (NEURONTIN) 300 MG capsule, 3 tab by mouth three times daily, Disp: 270 capsule, Rfl: 1   glucose blood (ONETOUCH VERIO) test strip, USE AS DIRECTED THREE TIMES DAILY E11.9, Disp: 300 strip, Rfl: 11   insulin aspart (NOVOLOG FLEXPEN) 100 UNIT/ML FlexPen, ADMINISTER 12 UNITS UNDER THE SKIN FOUR TIMES DAILY, Disp: 15 mL, Rfl: 2   levalbuterol (XOPENEX HFA) 45 MCG/ACT inhaler, Inhale 1-2 puffs into the lungs every 8 (eight) hours as needed for wheezing or shortness of breath. USE 2 PUFFS 3 TIMES DAILY X 5 DAYS THEN BACK TO HOME REGIMEN AS NEEDED., Disp: 15 g, Rfl: 0   mometasone-formoterol (DULERA) 200-5 MCG/ACT AERO, Inhale 2 puffs into the lungs in the morning and at bedtime., Disp: 8.8 g, Rfl: 5   nystatin (MYCOSTATIN) 100000 UNIT/ML suspension, TAKE 5 MLS BY MOUTH 4(FOUR) TIMES DAILY. SWISH FOR 30 SECONDS AND SWALLOW, Disp: 180 mL, Rfl: 0   ondansetron (ZOFRAN ODT) 4 MG disintegrating tablet, Take 1 tablet (4 mg total) by mouth every 8 (eight) hours as needed for nausea or vomiting., Disp: 30 tablet, Rfl: 11   oxyCODONE-acetaminophen (PERCOCET) 10-325 MG tablet, Take 1 tablet by mouth every 4 (four) hours as needed for pain., Disp: 30 tablet, Rfl: 0   pantoprazole (PROTONIX) 40 MG tablet, Take 1 tablet (40 mg total) by mouth daily., Disp: 90 tablet, Rfl: 3   Plecanatide (TRULANCE) 3 MG TABS, 1 tab by mouth once daily, Disp: 90 tablet, Rfl: 3    predniSONE (DELTASONE) 10 MG tablet, Take 4 tablets (40 mg total) by mouth daily for 3 days, THEN 3 tablets (30 mg total) daily for 3 days, THEN 2 tablets (20 mg total) daily for 3 days, THEN 1 tablet (10 mg total) daily for 3 days., Disp: 30 tablet, Rfl: 0   Rimegepant Sulfate (NURTEC) 75 MG TBDP, 1 tab by mouth once daily as needed, Disp: 16 tablet, Rfl: 11   rizatriptan (MAXALT-MLT) 10 MG disintegrating tablet, Take 1 tablet earliest onset of  migraine.  May repeat in 2 hours if needed.  Maximum 2 tablets in 24 hours, Disp: 9 tablet, Rfl: 11   rosuvastatin (CRESTOR) 40 MG tablet, 1 tab by mouth once daily, Disp: 90 tablet, Rfl: 3   Semaglutide, 1 MG/DOSE, 4 MG/3ML SOPN, Inject 1 mg as directed once a week., Disp: 3 mL, Rfl: 11   sertraline (ZOLOFT) 100 MG tablet, Take 1 tablet (100 mg total) by mouth 2 (two) times daily., Disp: 60 tablet, Rfl: 11   telmisartan-hydrochlorothiazide (MICARDIS HCT) 40-12.5 MG tablet, Take 1 tablet by mouth daily., Disp: 30 tablet, Rfl: 2   TURMERIC PO, Take 1 tablet by mouth daily., Disp: , Rfl:    vitamin B-12 (CYANOCOBALAMIN) 1000 MCG tablet, Take 1,000 mcg by mouth daily., Disp: , Rfl:    Vitamin D, Ergocalciferol, (DRISDOL) 1.25 MG (50000 UNIT) CAPS capsule, Take by mouth., Disp: , Rfl:    zonisamide (ZONEGRAN) 100 MG capsule, Take 2 capsules (200 mg total) by mouth daily., Disp: 180 capsule, Rfl: 3  EXAM:  VITALS per patient if applicable:  RR between 12-20 bpm, Temp 101 F  GENERAL: alert, oriented, appears well and in no acute distress  HEENT: atraumatic, conjunctiva clear, no obvious abnormalities on inspection of external nose and ears  NECK: normal movements of the head and neck  LUNGS: on inspection no signs of respiratory distress, breathing rate appears normal, no obvious gross SOB, gasping or wheezing  CV: no obvious cyanosis  MS: moves all visible extremities without noticeable abnormality  PSYCH/NEURO: pleasant and cooperative, no obvious  depression or anxiety, speech and thought processing grossly intact  ASSESSMENT AND PLAN:  Discussed the following assessment and plan:  Upper respiratory tract infection, unspecified type  Body aches  Chronic obstructive pulmonary disease, unspecified COPD type (HCC)  Given pt's continued symptoms despite abx and steroid advised on need for in person eval.  Pt to proceed to nearest ED for further evaluation.   I discussed the assessment and treatment plan with the patient. The patient was provided an opportunity to ask questions and all were answered. The patient agreed with the plan and demonstrated an understanding of the instructions.   The patient was advised to call back or seek an in-person evaluation if the symptoms worsen or if the condition fails to improve as anticipated.   Deeann Saint, MD

## 2023-05-18 ENCOUNTER — Other Ambulatory Visit: Payer: Self-pay

## 2023-05-18 ENCOUNTER — Ambulatory Visit
Admission: RE | Admit: 2023-05-18 | Discharge: 2023-05-18 | Disposition: A | Discharge status: Home / Self Care | Payer: Medicaid Other | Source: Ambulatory Visit | Attending: Family Medicine | Admitting: Family Medicine

## 2023-05-18 VITALS — BP 131/76 | HR 80 | Temp 98.8°F

## 2023-05-18 DIAGNOSIS — J208 Acute bronchitis due to other specified organisms: Secondary | ICD-10-CM | POA: Diagnosis not present

## 2023-05-18 DIAGNOSIS — H6992 Unspecified Eustachian tube disorder, left ear: Secondary | ICD-10-CM

## 2023-05-18 DIAGNOSIS — E1140 Type 2 diabetes mellitus with diabetic neuropathy, unspecified: Secondary | ICD-10-CM | POA: Diagnosis not present

## 2023-05-18 DIAGNOSIS — B9689 Other specified bacterial agents as the cause of diseases classified elsewhere: Secondary | ICD-10-CM | POA: Diagnosis not present

## 2023-05-18 DIAGNOSIS — Z794 Long term (current) use of insulin: Secondary | ICD-10-CM

## 2023-05-18 DIAGNOSIS — J449 Chronic obstructive pulmonary disease, unspecified: Secondary | ICD-10-CM | POA: Diagnosis not present

## 2023-05-18 MED ORDER — FLUTICASONE PROPIONATE 50 MCG/ACT NA SUSP: 2.0000 | Freq: Every day | NASAL | 0 refills | Status: DC

## 2023-05-18 MED ORDER — LEVOFLOXACIN 500 MG PO TABS: 500.0000 mg | ORAL_TABLET | Freq: Every day | ORAL | 0 refills | Status: DC

## 2023-05-18 NOTE — ED Triage Notes (Signed)
C/o feeling unsteady of feet and headache since monday

## 2023-05-18 NOTE — Discharge Instructions (Signed)
Continue to drink lots of fluids Take Mucinex or similar over-the-counter cough medicine as needed .  Amoxicillin-discontinue Take Levaquin once a day Use Flonase daily See your doctor if not improving by Monday

## 2023-05-18 NOTE — ED Triage Notes (Signed)
Also reports had fever monday

## 2023-05-18 NOTE — ED Provider Notes (Signed)
[facility]    CSN: [id number] Arrival date & time: 05/18/23  1651      History   Chief Complaint Chief Complaint  Patient presents with   Dizziness    I feel woozy and dizzy blood sugar levels have been high which has been expected due to the steroids that I am on but this ear thing is something new and I can't hear out of my left ear like my ear is stopped up. - Entered by patient    HPI Nicole Melendez is a 47 y.o. female.   [name] is an unfortunate young woman at 27 who has multiple medical problems.  She has had 10 medical visits the month of July, 2 of which were hospitalizations, 1 ER visit, the rest of them urgent care and primary care.  She has been sick the whole month and today is here complaining of left ear pressure, decreased hearing, left ear pain, and a dizzy sensation.  She states that she cannot pop her ear.  She is currently taking antibiotics that she feels are not helping.  She states she does better on Levaquin.  Possibly doxycycline but she states this upsets her stomach.  She is on prednisone which increases her sugar.  Her sugar today before coming here was 125.  When her sugar is high she is getting some blurry vision  Patient has multiple antibiotic allergies, her chart is reviewed Patient does have underlying COPD, recent pneumonia, persistent cough, sarcoidosis of lung, insulin-dependent diabetes.  Problem list is reviewed  Past Medical History:  Diagnosis Date   Allergic rhinitis, cause unspecified    Anemia    Anxiety    Arthritis    COPD (chronic obstructive pulmonary disease) (HCC)    Depression    Diabetes mellitus type II    steroid related, patient states "im no longer diabetic"   Essential hypertension 08/08/2015   GERD (gastroesophageal reflux disease)    History of blood transfusion    Hyperlipidemia    Menorrhagia    Migraine    Morbid obesity (HCC)    Otitis media 06/28/2015   Peptic ulcer disease    Pneumonia     Positive ANA (antinuclear antibody) 02/14/2012   Sarcoid    including hand per rheumatology-Dr. 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(HCC)    Shortness of breath    on exertion   Varicose veins with pain     Patient Active Problem List   Diagnosis Date Noted   Constipation 03/23/2023   Nail disorder 12/25/2022   Cellulitis of great toe 12/22/2022   Acute upper respiratory infection 09/06/2022   Vitamin D deficiency 10/21/2020   Right low back pain 10/15/2020   Recurrent falls while walking 10/15/2020   COVID-19 virus infection 09/28/2020   Exposure to COVID-19 virus 07/10/2020   Viral illness 07/10/2020   Sinusitis 05/23/2020   Allergic conjunctivitis 05/23/2020   Yeast vaginitis 05/23/2020   Polyarthralgia 02/20/2020   Tibialis posterior tendon tear, nontraumatic, right 02/08/2020   Neck pain 12/14/2019   COPD (chronic obstructive pulmonary disease) (HCC) 11/07/2019   Suspected COVID-19 virus infection 08/02/2019   Hematochezia 04/05/2019   Snoring 11/17/2018   Eye symptom 08/03/2018   Right lumbar radiculopathy 05/08/2018   Chronic pain 10/11/2017   Sarcoidosis of lung (HCC)    Acute on chronic diastolic CHF (congestive heart failure) (HCC) 09/22/2017   Rheumatoid arthritis (HCC) 09/18/2017   Chronic diastolic CHF (congestive heart failure) (HCC) 09/18/2017   Pancreatitis  06/26/2017   Right ankle sprain 06/26/2017   Possible exposure to STD 06/15/2017   Diarrhea 04/10/2017   Bilateral hand swelling 04/07/2017   Abdominal pain 04/07/2017   Abnormality of gait 01/26/2017   Avulsion fracture of right ankle 12/01/2016   Ulcer of esophagus with bleeding    Gastritis and gastroduodenitis    Blepharitis of right upper eyelid 10/09/2016   Peroneal tendinitis of lower leg, right 10/06/2016   Right ankle pain 09/16/2016   Dizziness 03/11/2016   Mixed headache 03/09/2016   Chronic venous insufficiency 02/24/2016   Gastroenteritis 12/30/2015   Headache 12/30/2015   Epistaxis  12/09/2015   Post concussive syndrome 12/03/2015   Cervical muscle strain 12/03/2015   Right-sided face pain 11/26/2015   Multiple contusions 11/26/2015   Neck pain, bilateral posterior 11/26/2015   Low back pain 11/26/2015   Spinal stenosis of lumbar region 10/28/2015   Chronic sciatica of left side 10/28/2015   DOE (dyspnea on exertion) 09/02/2015   Essential hypertension 08/08/2015   Diaphoresis 08/07/2015   Cough 07/02/2015   Vaginitis and vulvovaginitis 06/29/2015   Otitis media 06/28/2015   Thrush 06/24/2015   Nausea & vomiting 06/10/2015   Wheezing 11/14/2014   Angioedema 03/19/2014   Vaginitis 11/19/2012   Smoker 11/19/2012   Positive ANA (antinuclear antibody) 02/14/2012   Polyarthritis 02/11/2012   Migraine 04/05/2011   Encounter for well adult exam with abnormal findings 02/21/2011   Allergic rhinitis 02/17/2011   URTICARIA 09/29/2010   Depression 09/14/2010   Diabetes mellitus with neuropathy (HCC) 07/31/2010   PERIPHERAL EDEMA 07/31/2010   INSOMNIA-SLEEP DISORDER-UNSPEC 06/19/2010   MENORRHAGIA 05/15/2010   Hyperlipidemia 12/17/2009   Iron deficiency anemia 12/17/2009   Anxiety state 12/17/2009   Morbid obesity (HCC) 07/02/2007   Gastroesophageal reflux disease 07/02/2007   PEPTIC ULCER DISEASE 07/02/2007    Past Surgical History:  Procedure Laterality Date   ABDOMINAL HYSTERECTOMY     COLONOSCOPY WITH PROPOFOL N/A 10/19/2016   Procedure: COLONOSCOPY WITH PROPOFOL;  Surgeon: Napoleon Form, MD;  Location: [facility];  Service: Endoscopy;  Laterality: N/A;   ESOPHAGOGASTRODUODENOSCOPY (EGD) WITH PROPOFOL N/A 10/19/2016   Procedure: ESOPHAGOGASTRODUODENOSCOPY (EGD) WITH PROPOFOL;  Surgeon: Napoleon Form, MD;  Location: [facility];  Service: Endoscopy;  Laterality: N/A;   HERNIA MESH REMOVAL  02/2013   HERNIA REPAIR     KNEE ARTHROSCOPY Left 01/15/2022   Procedure: ARTHROSCOPY KNEE;  Surgeon: Beverely Low, MD;  Location: [facility];  Service:  Orthopedics;  Laterality: Left;   uterine ablation  03/2010   WISDOM TOOTH EXTRACTION      OB History   No obstetric history on file.      Home Medications    Prior to Admission medications   Medication Sig Start Date End Date Taking? Authorizing Provider  fluticasone (FLONASE) 50 MCG/ACT nasal spray Place 2 sprays into both nostrils daily. 05/18/23  Yes Eustace Moore, MD  levofloxacin (LEVAQUIN) 500 MG tablet Take 1 tablet (500 mg total) by mouth daily. 05/18/23  Yes Eustace Moore, MD  acetaminophen (TYLENOL) 650 MG CR tablet Take 650-1,300 mg by mouth every 8 (eight) hours as needed for pain.    [provider]  albuterol (VENTOLIN HFA) 108 (90 Base) MCG/ACT inhaler INHALE 2 PUFFS INTO THE LUNGS EVERY 4 HOURS AS NEEDED FOR WHEEZING OR SHORTNESS OF BREATH 03/31/20   Byrum, Les Pou, MD  ALPRAZolam Prudy Feeler) 0.5 MG tablet Take 1 tablet (0.5 mg total) by mouth 2 (two) times daily as needed for  anxiety. 04/21/23   [name], MD  amoxicillin-clavulanate (AUGMENTIN) 875-125 MG tablet Take 1 tablet by mouth 2 (two) times daily. 05/13/23   Leslye Peer, MD  azelastine (ASTELIN) 0.1 % nasal spray Place 2 sprays into both nostrils 2 (two) times daily. Use in each nostril as directed 04/01/21   [name], MD  BD PEN NEEDLE NANO 2ND GEN 32G X 4 MM MISC USE AS DIRECTED THREE TIMES DAILY 03/17/21   [name], MD  Blood Glucose Monitoring Suppl [facility] VERIO) w/Device KIT Use as directed daily E11.9 03/25/23   [name], MD  clindamycin (CLEOCIN) 150 MG capsule Take 150 mg by mouth 4 (four) times daily. 01/10/23   [provider]  gabapentin (NEURONTIN) 300 MG capsule 3 tab by mouth three times daily 04/20/23   [name], MD  glucose blood [facility] VERIO) test strip USE AS DIRECTED THREE TIMES DAILY E11.9 03/22/23   [name], MD  insulin aspart (NOVOLOG FLEXPEN) 100 UNIT/ML FlexPen ADMINISTER 12 UNITS UNDER THE SKIN FOUR TIMES DAILY 03/22/23   [name],  MD  levalbuterol [facility] HFA) 45 MCG/ACT inhaler Inhale 1-2 puffs into the lungs every 8 (eight) hours as needed for wheezing or shortness of breath. USE 2 PUFFS 3 TIMES DAILY X 5 DAYS THEN BACK TO HOME REGIMEN AS NEEDED. 05/11/23   Leslye Peer, MD  mometasone-formoterol (DULERA) 200-5 MCG/ACT AERO Inhale 2 puffs into the lungs in the morning and at bedtime. 05/11/23   Leslye Peer, MD  nystatin (MYCOSTATIN) 100000 UNIT/ML suspension TAKE 5 MLS BY MOUTH 4(FOUR) TIMES DAILY. SWISH FOR 30 SECONDS AND SWALLOW 03/24/21   Rodolph Bong, MD  ondansetron (ZOFRAN ODT) 4 MG disintegrating tablet Take 1 tablet (4 mg total) by mouth every 8 (eight) hours as needed for nausea or vomiting. 12/27/22   Drema Dallas, DO  oxyCODONE-acetaminophen (PERCOCET) 10-325 MG tablet Take 1 tablet by mouth every 4 (four) hours as needed for pain. 01/15/22   Beverely Low, MD  pantoprazole (PROTONIX) 40 MG tablet Take 1 tablet (40 mg total) by mouth daily. 04/13/23   [name], MD  Plecanatide [facility]on Jester) 3 MG TABS 1 tab by mouth once daily 03/22/23   [name], MD  predniSONE (DELTASONE) 10 MG tablet Take 4 tablets (40 mg total) by mouth daily for 3 days, THEN 3 tablets (30 mg total) daily for 3 days, THEN 2 tablets (20 mg total) daily for 3 days, THEN 1 tablet (10 mg total) daily for 3 days. 05/13/23 05/25/23  Leslye Peer, MD  Rimegepant Sulfate (NURTEC) 75 MG TBDP 1 tab by mouth once daily as needed 03/22/23   [name], MD  rizatriptan (MAXALT-MLT) 10 MG disintegrating tablet Take 1 tablet earliest onset of migraine.  May repeat in 2 hours if needed.  Maximum 2 tablets in 24 hours 12/27/22   Drema Dallas, DO  rosuvastatin (CRESTOR) 40 MG tablet 1 tab by mouth once daily 04/21/23   [name], MD  Semaglutide, 1 MG/DOSE, 4 MG/3ML SOPN Inject 1 mg as directed once a week. 04/13/23   [name], MD  sertraline (ZOLOFT) 100 MG tablet Take 1 tablet (100 mg total) by mouth 2 (two) times daily. 04/08/23   [name],  MD  telmisartan-hydrochlorothiazide (MICARDIS HCT) 40-12.5 MG tablet Take 1 tablet by mouth daily. 11/08/22   [name], MD  TURMERIC PO Take 1 tablet by mouth  daily.    [provider]  vitamin B-12 (CYANOCOBALAMIN) 1000 MCG tablet Take 1,000 mcg by mouth daily.    [provider]  Vitamin D, Ergocalciferol, (DRISDOL) 1.25 MG (50000 UNIT) CAPS capsule Take by mouth. 02/08/20   [provider]  zonisamide (ZONEGRAN) 100 MG capsule Take 2 capsules (200 mg total) by mouth daily. 12/27/22   Drema Dallas, DO    Family History Family History  Problem Relation Age of Onset   Allergies Mother    Heart attack Mother    Diabetes Mother    Diabetes Father    Heart disease Father    Rheum arthritis Father    Stroke Father    Hypertension Other    Ovarian cancer Maternal Aunt    Lung cancer Maternal Aunt    Breast cancer Maternal Aunt    Bone cancer Maternal Aunt    Stroke Maternal Uncle    Other Neg Hx     Social History Social History   Tobacco Use   Smoking status: Former    Current packs/day: 0.00    Average packs/day: 1 pack/day for 20.0 years (20.0 ttl pk-yrs)    Types: Cigarettes    Start date: 10/25/1992    Quit date: 10/25/2012    Years since quitting: 10.5   Smokeless tobacco: Never   Tobacco comments:    QUIT 04/2010 AND STARTED BACK 2014 X 3 MONTHS. less than 1 ppd.  started at age 10.    Vaping Use   Vaping status: Never Used  Substance Use Topics   Alcohol use: No   Drug use: No     Allergies   Azithromycin, Bee venom, Flagyl [metronidazole], Penicillins, Shellfish allergy, Klonopin [clonazepam], Cephalexin, and Cephalosporins   Review of Systems Review of Systems  See HPI Physical Exam Triage Vital Signs ED Triage Vitals  Encounter Vitals Group     BP 05/18/23 1659 131/76     Systolic BP Percentile --      Diastolic BP Percentile --      Pulse Rate 05/18/23 1659 80     Resp --      Temp 05/18/23 1659 98.8 F (37.1 C)      Temp Source 05/18/23 1659 Oral     SpO2 --      Weight --      Height --      Head Circumference --      Peak Flow --      Pain Score 05/18/23 1701 6     Pain Loc --      Pain Education --      Exclude from Growth Chart --    No data found.  Updated Vital Signs BP 131/76 (BP Location: Left Arm)   Pulse 80   Temp 98.8 F (37.1 C) (Oral)   LMP 07/18/2011       Physical Exam Constitutional:      General: She is not in acute distress.    Appearance: She is well-developed. She is obese. She is ill-appearing.  HENT:     Head: Normocephalic and atraumatic.     Right Ear: Tympanic membrane and ear canal normal.     Left Ear: Ear canal normal.     Ears:     Comments: Left TM is retracted right TM and canal are normal    Nose: Congestion and rhinorrhea present.     Mouth/Throat:     Mouth: Mucous membranes are moist.     Pharynx: No  posterior oropharyngeal erythema.  Eyes:     Conjunctiva/sclera: Conjunctivae normal.     Pupils: Pupils are equal, round, and reactive to light.  Cardiovascular:     Rate and Rhythm: Normal rate and regular rhythm.     Heart sounds: Normal heart sounds.  Pulmonary:     Effort: Pulmonary effort is normal. No respiratory distress.     Comments: Decreased breath sounds, few rhonchi Abdominal:     General: There is no distension.     Palpations: Abdomen is soft.  Musculoskeletal:        General: Normal range of motion.     Cervical back: Normal range of motion.  Skin:    General: Skin is warm and dry.  Neurological:     General: No focal deficit present.     Mental Status: She is alert.      UC Treatments / Results  Labs (all labs ordered are listed, but only abnormal results are displayed) Labs Reviewed - No data to display  EKG   Radiology No results found.  Procedures Procedures (including critical care time)  Medications Ordered in UC Medications - No data to display  Initial Impression / Assessment and Plan / UC Course   I have reviewed the triage vital signs and the nursing notes.  Pertinent labs & imaging results that were available during my care of the patient were reviewed by me and considered in my medical decision making (see chart for details).     Final Clinical Impressions(s) / UC Diagnoses   Final diagnoses:  Acute bacterial bronchitis  Type 2 diabetes mellitus with diabetic neuropathy, with long-term current use of insulin (HCC)  Chronic obstructive pulmonary disease, unspecified COPD type (HCC)  Eustachian tube dysfunction, left     Discharge Instructions      Continue to drink lots of fluids Take Mucinex or similar over-the-counter cough medicine as needed .  Amoxicillin-discontinue Take Levaquin once a day Use Flonase daily See your doctor if not improving by Monday    ED Prescriptions     Medication Sig Dispense Auth. Provider   levofloxacin (LEVAQUIN) 500 MG tablet Take 1 tablet (500 mg total) by mouth daily. 7 tablet Eustace Moore, MD   fluticasone [facility]) 50 MCG/ACT nasal spray Place 2 sprays into both nostrils daily. 16 g Eustace Moore, MD      PDMP not reviewed this encounter.   Eustace Moore, MD 05/18/23 [phone]

## 2023-05-20 ENCOUNTER — Other Ambulatory Visit: Payer: Medicaid Other

## 2023-05-21 ENCOUNTER — Other Ambulatory Visit: Payer: Self-pay | Admitting: Emergency Medicine

## 2023-06-01 ENCOUNTER — Emergency Department (HOSPITAL_BASED_OUTPATIENT_CLINIC_OR_DEPARTMENT_OTHER): Payer: Medicaid Other

## 2023-06-01 ENCOUNTER — Ambulatory Visit: Payer: Medicaid Other | Admitting: Family Medicine

## 2023-06-01 ENCOUNTER — Emergency Department (HOSPITAL_BASED_OUTPATIENT_CLINIC_OR_DEPARTMENT_OTHER)
Admission: EM | Admit: 2023-06-01 | Discharge: 2023-06-01 | Disposition: A | Discharge status: Home / Self Care | Payer: Medicaid Other | Attending: Emergency Medicine | Admitting: Emergency Medicine

## 2023-06-01 DIAGNOSIS — I10 Essential (primary) hypertension: Secondary | ICD-10-CM | POA: Diagnosis not present

## 2023-06-01 DIAGNOSIS — R1013 Epigastric pain: Secondary | ICD-10-CM

## 2023-06-01 DIAGNOSIS — K219 Gastro-esophageal reflux disease without esophagitis: Secondary | ICD-10-CM | POA: Diagnosis not present

## 2023-06-01 DIAGNOSIS — E119 Type 2 diabetes mellitus without complications: Secondary | ICD-10-CM | POA: Insufficient documentation

## 2023-06-01 DIAGNOSIS — J449 Chronic obstructive pulmonary disease, unspecified: Secondary | ICD-10-CM | POA: Diagnosis not present

## 2023-06-01 DIAGNOSIS — K429 Umbilical hernia without obstruction or gangrene: Secondary | ICD-10-CM | POA: Diagnosis not present

## 2023-06-01 DIAGNOSIS — Z794 Long term (current) use of insulin: Secondary | ICD-10-CM | POA: Insufficient documentation

## 2023-06-01 DIAGNOSIS — R109 Unspecified abdominal pain: Secondary | ICD-10-CM | POA: Diagnosis not present

## 2023-06-01 DIAGNOSIS — Z7951 Long term (current) use of inhaled steroids: Secondary | ICD-10-CM | POA: Insufficient documentation

## 2023-06-01 DIAGNOSIS — Z79899 Other long term (current) drug therapy: Secondary | ICD-10-CM | POA: Diagnosis not present

## 2023-06-01 LAB — COMPREHENSIVE METABOLIC PANEL
ALT: 7 U/L (ref 0–44)
AST: 9 U/L — ABNORMAL LOW (ref 15–41)
Albumin: 4 g/dL (ref 3.5–5.0)
Alkaline Phosphatase: 111 U/L (ref 38–126)
Anion gap: 8 (ref 5–15)
BUN: 7 mg/dL (ref 6–20)
CO2: 28 mmol/L (ref 22–32)
Calcium: 9.7 mg/dL (ref 8.9–10.3)
Chloride: 101 mmol/L (ref 98–111)
Creatinine, Ser: 0.89 mg/dL (ref 0.44–1.00)
GFR, Estimated: >60 mL/min (ref >60)
Glucose, Bld: 105 mg/dL — ABNORMAL HIGH (ref 70–99)
Potassium: 3.3 mmol/L — ABNORMAL LOW (ref 3.5–5.1)
Sodium: 137 mmol/L (ref 135–145)
Total Bilirubin: 0.4 mg/dL (ref 0.3–1.2)
Total Protein: 7 g/dL (ref 6.5–8.1)

## 2023-06-01 LAB — URINALYSIS, ROUTINE W REFLEX MICROSCOPIC
Bilirubin Urine: NEGATIVE
Glucose, UA: NEGATIVE mg/dL
Ketones, ur: NEGATIVE mg/dL
Leukocytes,Ua: NEGATIVE
Nitrite: NEGATIVE
Specific Gravity, Urine: 1.016 (ref 1.005–1.030)
pH: 6 (ref 5.0–8.0)

## 2023-06-01 LAB — LIPASE, BLOOD: Lipase: 71 U/L — ABNORMAL HIGH (ref 11–51)

## 2023-06-01 LAB — CBC
HCT: 39.4 % (ref 36.0–46.0)
Hemoglobin: 13.5 g/dL (ref 12.0–15.0)
MCH: 28.7 pg (ref 26.0–34.0)
MCHC: 34.3 g/dL (ref 30.0–36.0)
MCV: 83.8 fL (ref 80.0–100.0)
Platelets: 303 10*3/uL (ref 150–400)
RBC: 4.7 MIL/uL (ref 3.87–5.11)
RDW: 14.1 % (ref 11.5–15.5)
WBC: 8.9 10*3/uL (ref 4.0–10.5)
nRBC: 0 % (ref 0.0–0.2)

## 2023-06-01 MED ORDER — IOHEXOL 300 MG/ML  SOLN
100.0000 mL | Freq: Once | INTRAMUSCULAR | Status: AC | PRN
  Administered 2023-06-01: 90 mL via INTRAVENOUS

## 2023-06-01 MED ORDER — ONDANSETRON HCL 4 MG/5ML PO SOLN: 4.0000 mg | Freq: Three times a day (TID) | ORAL | 0 refills | Status: DC | PRN

## 2023-06-01 MED ORDER — ALUM & MAG HYDROXIDE-SIMETH 200-200-20 MG/5ML PO SUSP
30.0000 mL | Freq: Once | ORAL | Status: AC
  Administered 2023-06-01: 30 mL via ORAL
  Filled 2023-06-01: qty 30

## 2023-06-01 MED ORDER — ONDANSETRON HCL 4 MG/2ML IJ SOLN
4.0000 mg | Freq: Once | INTRAMUSCULAR | Status: AC
  Administered 2023-06-01: 4 mg via INTRAVENOUS
  Filled 2023-06-01: qty 2

## 2023-06-01 MED ORDER — SODIUM CHLORIDE 0.9 % IV BOLUS
1000.0000 mL | Freq: Once | INTRAVENOUS | Status: AC
  Administered 2023-06-01: 1000 mL via INTRAVENOUS

## 2023-06-01 NOTE — ED Notes (Signed)
Patient transported to CT 

## 2023-06-01 NOTE — ED Provider Notes (Signed)
[place] EMERGENCY DEPARTMENT AT [facility] Provider Note   CSN: [id number] Arrival date & time: 06/01/23  1814     History  Chief Complaint  Patient presents with   Abdominal Pain    Nicole Melendez is a 47 y.o. female.  Pt is a 47 yo female with pmhx significant for obesity, pud, gerd, migraines, anxiety, anemia, hld, depression, sarcoid, htn, dm, copd, arthritis, and pancreatitis.  Pt said she's had abd pain for about a week.  She is worried that she has pancreatitis again.  She has had some n/v.  No fevers.       Home Medications Prior to Admission medications   Medication Sig Start Date End Date Taking? Authorizing Provider  ondansetron (ZOFRAN) 4 MG/5ML solution Take 5 mLs (4 mg total) by mouth every 8 (eight) hours as needed for nausea or vomiting. 06/01/23  Yes Jacalyn Lefevre, MD  acetaminophen (TYLENOL) 650 MG CR tablet Take 650-1,300 mg by mouth every 8 (eight) hours as needed for pain.    [provider]  albuterol (VENTOLIN HFA) 108 (90 Base) MCG/ACT inhaler INHALE 2 PUFFS INTO THE LUNGS EVERY 4 HOURS AS NEEDED FOR WHEEZING OR SHORTNESS OF BREATH 03/31/20   Byrum, Les Pou, MD  ALPRAZolam Prudy Feeler) 0.5 MG tablet Take 1 tablet (0.5 mg total) by mouth 2 (two) times daily as needed for anxiety. 04/21/23   [name], MD  amoxicillin-clavulanate (AUGMENTIN) 875-125 MG tablet Take 1 tablet by mouth 2 (two) times daily. 05/13/23   Leslye Peer, MD  azelastine (ASTELIN) 0.1 % nasal spray Place 2 sprays into both nostrils 2 (two) times daily. Use in each nostril as directed 04/01/21   [name], MD  BD PEN NEEDLE NANO 2ND GEN 32G X 4 MM MISC USE AS DIRECTED THREE TIMES DAILY 03/17/21   [name], MD  Blood Glucose Monitoring Suppl [facility] VERIO) w/Device KIT Use as directed daily E11.9 03/25/23   [name], MD  clindamycin (CLEOCIN) 150 MG capsule Take 150 mg by mouth 4 (four) times daily. 01/10/23   [provider]  fluticasone (FLONASE) 50  MCG/ACT nasal spray Place 2 sprays into both nostrils daily. 05/18/23   Eustace Moore, MD  gabapentin (NEURONTIN) 300 MG capsule 3 tab by mouth three times daily 04/20/23   [name], MD  glucose blood [facility] VERIO) test strip USE AS DIRECTED THREE TIMES DAILY E11.9 03/22/23   [name], MD  insulin aspart (NOVOLOG FLEXPEN) 100 UNIT/ML FlexPen ADMINISTER 12 UNITS UNDER THE SKIN FOUR TIMES DAILY 03/22/23   [name], MD  levofloxacin (LEVAQUIN) 500 MG tablet Take 1 tablet (500 mg total) by mouth daily. 05/18/23   Eustace Moore, MD  mometasone-formoterol [facility]) 200-5 MCG/ACT AERO Inhale 2 puffs into the lungs in the morning and at bedtime. 05/11/23   Leslye Peer, MD  nystatin (MYCOSTATIN) 100000 UNIT/ML suspension TAKE 5 MLS BY MOUTH 4(FOUR) TIMES DAILY. SWISH FOR 30 SECONDS AND SWALLOW 03/24/21   Rodolph Bong, MD  ondansetron (ZOFRAN ODT) 4 MG disintegrating tablet Take 1 tablet (4 mg total) by mouth every 8 (eight) hours as needed for nausea or vomiting. 12/27/22   Drema Dallas, DO  oxyCODONE-acetaminophen (PERCOCET) 10-325 MG tablet Take 1 tablet by mouth every 4 (four) hours as needed for pain. 01/15/22   Beverely Low, MD  pantoprazole (PROTONIX) 40 MG tablet Take 1 tablet (40 mg total) by mouth daily. 04/13/23   Jonny Ruiz,  Len Blalock, MD  Plecanatide [facility]) 3 MG TABS 1 tab by mouth once daily 03/22/23   [name], MD  Rimegepant Sulfate (NURTEC) 75 MG TBDP 1 tab by mouth once daily as needed 03/22/23   [name], MD  rizatriptan (MAXALT-MLT) 10 MG disintegrating tablet Take 1 tablet earliest onset of migraine.  May repeat in 2 hours if needed.  Maximum 2 tablets in 24 hours 12/27/22   Drema Dallas, DO  rosuvastatin (CRESTOR) 40 MG tablet 1 tab by mouth once daily 04/21/23   [name], MD  Semaglutide, 1 MG/DOSE, 4 MG/3ML SOPN Inject 1 mg as directed once a week. 04/13/23   [name], MD  sertraline (ZOLOFT) 100 MG tablet Take 1 tablet (100 mg total) by mouth 2 (two)  times daily. 04/08/23   [name], MD  telmisartan-hydrochlorothiazide (MICARDIS HCT) 40-12.5 MG tablet Take 1 tablet by mouth daily. 11/08/22   [name], MD  TURMERIC PO Take 1 tablet by mouth daily.    [provider]  vitamin B-12 (CYANOCOBALAMIN) 1000 MCG tablet Take 1,000 mcg by mouth daily.    [provider]  Vitamin D, Ergocalciferol, (DRISDOL) 1.25 MG (50000 UNIT) CAPS capsule Take by mouth. 02/08/20   [provider]  XOPENEX HFA 45 MCG/ACT inhaler INHALE 1 TO 2 PUFFS INOT THE LUNGS EVERY 8 HOURS AS NEEDED FOR WHEEZING OR SHORTNESS OF BREATH, USE 2 PUFFS 3 TIMES DAILY THEN BACK TO HOME REGIMEN 05/23/23   Leslye Peer, MD  zonisamide (ZONEGRAN) 100 MG capsule Take 2 capsules (200 mg total) by mouth daily. 12/27/22   Drema Dallas, DO      Allergies    Azithromycin, Bee venom, Flagyl [metronidazole], Penicillins, Shellfish allergy, Klonopin [clonazepam], Cephalexin, and Cephalosporins    Review of Systems   Review of Systems  Gastrointestinal:  Positive for abdominal pain, nausea and vomiting.  All other systems reviewed and are negative.   Physical Exam Updated Vital Signs BP 103/67 (BP Location: Right Arm)   Pulse 87   Temp 98.7 F (37.1 C)   Resp 17   Ht 5\' 7"  (1.702 m)   Wt 128.8 kg   LMP 07/18/2011   SpO2 99%   BMI 44.48 kg/m  Physical Exam Vitals and nursing note reviewed.  Constitutional:      Appearance: She is well-developed.  HENT:     Head: Normocephalic and atraumatic.     Mouth/Throat:     Mouth: Mucous membranes are moist.     Pharynx: Oropharynx is clear.  Eyes:     Extraocular Movements: Extraocular movements intact.     Pupils: Pupils are equal, round, and reactive to light.  Cardiovascular:     Rate and Rhythm: Normal rate and regular rhythm.     Heart sounds: Normal heart sounds.  Pulmonary:     Effort: Pulmonary effort is normal.     Breath sounds: Normal breath sounds.  Abdominal:     General: Abdomen is  flat. Bowel sounds are normal.     Palpations: Abdomen is soft.     Tenderness: There is abdominal tenderness in the epigastric area and left lower quadrant.  Skin:    General: Skin is warm.     Capillary Refill: Capillary refill takes less than 2 seconds.  Neurological:     General: No focal deficit present.     Mental Status: She is alert and oriented to person, place, and time.  Psychiatric:  Mood and Affect: Mood normal.        Behavior: Behavior normal.     ED Results / Procedures / Treatments   Labs (all labs ordered are listed, but only abnormal results are displayed) Labs Reviewed  LIPASE, BLOOD - Abnormal; Notable for the following components:      Result Value   Lipase 71 (*)    All other components within normal limits  COMPREHENSIVE METABOLIC PANEL - Abnormal; Notable for the following components:   Potassium 3.3 (*)    Glucose, Bld 105 (*)    AST 9 (*)    All other components within normal limits  URINALYSIS, ROUTINE W REFLEX MICROSCOPIC - Abnormal; Notable for the following components:   Hgb urine dipstick SMALL (*)    Protein, ur TRACE (*)    Bacteria, UA RARE (*)    All other components within normal limits  CBC    EKG None  Radiology CT ABDOMEN PELVIS W CONTRAST  Result Date: 06/01/2023 CLINICAL DATA:  Abdominal pain, acute, nonlocalized EXAM: CT ABDOMEN AND PELVIS WITH CONTRAST TECHNIQUE: Multidetector CT imaging of the abdomen and pelvis was performed using the standard protocol following bolus administration of intravenous contrast. RADIATION DOSE REDUCTION: This exam was performed according to the departmental dose-optimization program which includes automated exposure control, adjustment of the mA and/or kV according to patient size and/or use of iterative reconstruction technique. CONTRAST:  90mL OMNIPAQUE IOHEXOL 300 MG/ML  SOLN COMPARISON:  04/04/2023 FINDINGS: Lower chest: No acute abnormality Hepatobiliary: No focal hepatic abnormality.  Gallbladder unremarkable. Pancreas: No focal abnormality or ductal dilatation. Spleen: No focal abnormality.  Normal size. Adrenals/Urinary Tract: No adrenal abnormality. No focal renal abnormality. No stones or hydronephrosis. Urinary bladder is unremarkable. Stomach/Bowel: Prior gastric sleeve. Stomach, large and small bowel grossly unremarkable. Normal appendix. Vascular/Lymphatic: No evidence of aneurysm or adenopathy. Reproductive: Prior hysterectomy.  No adnexal masses. Other: No free fluid or free air. Umbilical hernia containing fat, unchanged. Musculoskeletal: No acute bony abnormality. IMPRESSION: No acute findings in the abdomen or pelvis. Prior gastric sleeve. Umbilical hernia containing fat, unchanged. Electronically Signed   By: Charlett Nose M.D.   On: 06/01/2023 20:45    Procedures Procedures    Medications Ordered in ED Medications  sodium chloride 0.9 % bolus 1,000 mL (1,000 mLs Intravenous New Bag/Given 06/01/23 2018)  ondansetron (ZOFRAN) injection 4 mg (4 mg Intravenous Given 06/01/23 2016)  alum & mag hydroxide-simeth (MAALOX/MYLANTA) 200-200-20 MG/5ML suspension 30 mL (30 mLs Oral Given 06/01/23 2033)  iohexol (OMNIPAQUE) 300 MG/ML solution 100 mL (90 mLs Intravenous Contrast Given 06/01/23 2021)    ED Course/ Medical Decision Making/ A&P                                 Medical Decision Making Amount and/or Complexity of Data Reviewed Labs: ordered. Radiology: ordered.  Risk OTC drugs. Prescription drug management.   This patient presents to the ED for concern of abd pain, this involves an extensive number of treatment options, and is a complaint that carries with it a high risk of complications and morbidity.  The differential diagnosis includes pancreatitis, gastritis, diverticulitis, gerd   Co morbidities that complicate the patient evaluation  obesity, pud, gerd, migraines, anxiety, anemia, hld, depression, sarcoid, htn, dm, copd, arthritis, and  pancreatitis   Additional history obtained:  Additional history obtained from epic chart review  Lab Tests:  I Ordered, and personally interpreted labs.  The pertinent results include:  cbc nl, cmp nl, ua neg for uti; lipase sl elevated at 71   Imaging Studies ordered:  I ordered imaging studies including ct abd/pelvis  I independently visualized and interpreted imaging which showed  No acute findings in the abdomen or pelvis.    Prior gastric sleeve.    Umbilical hernia containing fat, unchanged.   I agree with the radiologist interpretation   Cardiac Monitoring:  The patient was maintained on a cardiac monitor.  I personally viewed and interpreted the cardiac monitored which showed an underlying rhythm of: nsr   Medicines ordered and prescription drug management:  I ordered medication including ivfs/zofran, gi cocktail  for sx  Reevaluation of the patient after these medicines showed that the patient improved I have reviewed the patients home medicines and have made adjustments as needed   Test Considered:  ct   Critical Interventions:  Pain control   Problem List / ED Course:  Abd pain:  pt is feeling much better after gi cocktail. She is stable for d/c.  Return if worse.  F/u with pcp.   Reevaluation:  After the interventions noted above, I reevaluated the patient and found that they have :improved   Social Determinants of Health:  Lives at home   Dispostion:  After consideration of the diagnostic results and the patients response to treatment, I feel that the patent would benefit from discharge with outpatient f/u.          Final Clinical Impression(s) / ED Diagnoses Final diagnoses:  Epigastric pain  Gastroesophageal reflux disease, unspecified whether esophagitis present    Rx / DC Orders ED Discharge Orders          Ordered    ondansetron [facility]) 4 MG/5ML solution  Every 8 hours PRN        06/01/23 2121               Jacalyn Lefevre, MD 06/01/23 2123

## 2023-06-01 NOTE — ED Triage Notes (Signed)
Pt arrived POV for abd pain for 1 week, pt reports hx of pancreatitis, stated "levels are usually high", hx of diabetes. Pt reports n/v, stated CBG has been stable. VSS, afebrile, A&O x4.

## 2023-06-01 NOTE — ED Notes (Signed)
 RN reviewed discharge instructions with pt. Pt verbalized understanding and had no further questions. VSS upon discharge.  

## 2023-06-01 NOTE — Discharge Instructions (Addendum)
Increase protonix to 40 mg twice a day for 1 week. Decrease Ozempic to 1 mg.

## 2023-06-01 NOTE — ED Notes (Signed)
Pt reports pain has improved

## 2023-06-03 ENCOUNTER — Ambulatory Visit: Payer: Medicaid Other | Admitting: Internal Medicine

## 2023-06-03 ENCOUNTER — Ambulatory Visit
Admission: RE | Admit: 2023-06-03 | Discharge: 2023-06-03 | Disposition: A | Discharge status: Home / Self Care | Payer: Medicaid Other | Source: Ambulatory Visit | Attending: Emergency Medicine | Admitting: Emergency Medicine

## 2023-06-03 ENCOUNTER — Encounter: Payer: Self-pay | Admitting: Emergency Medicine

## 2023-06-03 DIAGNOSIS — D860 Sarcoidosis of lung: Secondary | ICD-10-CM | POA: Diagnosis not present

## 2023-06-03 NOTE — Telephone Encounter (Signed)
Spoke with patient she said that she notice this on last week , after she was using her inhaler and nasal spray to clear up some congestion that she was having in her ears, some coughing. She said that she has been running a lower grade fever that has since gone away and having to clear her throat to talk. No other symptoms, she wanted to know if she could have some thing sent to into the pharmacy

## 2023-06-06 DIAGNOSIS — F410 Panic disorder [episodic paroxysmal anxiety] without agoraphobia: Secondary | ICD-10-CM | POA: Diagnosis not present

## 2023-06-06 DIAGNOSIS — F411 Generalized anxiety disorder: Secondary | ICD-10-CM | POA: Diagnosis not present

## 2023-06-06 DIAGNOSIS — M7061 Trochanteric bursitis, right hip: Secondary | ICD-10-CM | POA: Diagnosis not present

## 2023-06-06 DIAGNOSIS — Z6841 Body Mass Index (BMI) 40.0 and over, adult: Secondary | ICD-10-CM | POA: Diagnosis not present

## 2023-06-06 DIAGNOSIS — Z79899 Other long term (current) drug therapy: Secondary | ICD-10-CM | POA: Diagnosis not present

## 2023-06-06 DIAGNOSIS — M5440 Lumbago with sciatica, unspecified side: Secondary | ICD-10-CM | POA: Diagnosis not present

## 2023-06-06 DIAGNOSIS — E119 Type 2 diabetes mellitus without complications: Secondary | ICD-10-CM | POA: Diagnosis not present

## 2023-06-06 DIAGNOSIS — M539 Dorsopathy, unspecified: Secondary | ICD-10-CM | POA: Diagnosis not present

## 2023-06-06 MED ORDER — NYSTATIN 100000 UNIT/ML MT SUSP: OROMUCOSAL | 0 refills | Status: DC

## 2023-06-06 NOTE — Telephone Encounter (Signed)
Ok to order nystatin S&S, 20cc tid for 3 days. Thanks

## 2023-06-13 ENCOUNTER — Other Ambulatory Visit: Payer: Self-pay | Admitting: Internal Medicine

## 2023-06-17 ENCOUNTER — Ambulatory Visit (HOSPITAL_BASED_OUTPATIENT_CLINIC_OR_DEPARTMENT_OTHER): Payer: Medicaid Other | Attending: Emergency Medicine | Admitting: Pulmonary Disease

## 2023-06-17 ENCOUNTER — Other Ambulatory Visit: Payer: Self-pay

## 2023-06-17 VITALS — Ht 67.0 in | Wt 285.0 lb

## 2023-06-17 DIAGNOSIS — J449 Chronic obstructive pulmonary disease, unspecified: Secondary | ICD-10-CM | POA: Insufficient documentation

## 2023-06-17 DIAGNOSIS — G4733 Obstructive sleep apnea (adult) (pediatric): Secondary | ICD-10-CM | POA: Insufficient documentation

## 2023-06-17 DIAGNOSIS — I110 Hypertensive heart disease with heart failure: Secondary | ICD-10-CM | POA: Insufficient documentation

## 2023-06-17 DIAGNOSIS — G4736 Sleep related hypoventilation in conditions classified elsewhere: Secondary | ICD-10-CM | POA: Insufficient documentation

## 2023-06-17 DIAGNOSIS — I509 Heart failure, unspecified: Secondary | ICD-10-CM | POA: Insufficient documentation

## 2023-06-17 DIAGNOSIS — R0683 Snoring: Secondary | ICD-10-CM | POA: Diagnosis not present

## 2023-06-17 MED ORDER — TELMISARTAN-HCTZ 40-12.5 MG PO TABS: 1.0000 | ORAL_TABLET | Freq: Every day | ORAL | 2 refills | Status: DC

## 2023-06-22 DIAGNOSIS — G4733 Obstructive sleep apnea (adult) (pediatric): Secondary | ICD-10-CM

## 2023-06-22 NOTE — Procedures (Signed)
Patient Name: Nicole Melendez, Middaugh Date: 06/17/2023 Gender: Female D.O.B: 1976-04-23 Age (years): 47 Referring Provider: Levy Pupa Height (inches): 67 Interpreting Physician: Cyril Mourning MD, ABSM Weight (lbs): 285 RPSGT: Lowry Ram BMI: 45 MRN: [id number] Neck Size: 15.00 <br> <br> CLINICAL INFORMATION Sleep Study Type: NPSG    Indication for sleep study: Congestive Heart Failure, sarcoidosis, COPD, Diabetes, Hypertension, Obesity, OSA, Re-Evaluation, Snoring    Epworth Sleepiness Score: 0    SLEEP STUDY TECHNIQUE As per the AASM Manual for the Scoring of Sleep and Associated Events v2.3 (April 2016) with a hypopnea requiring 4% desaturations.  The channels recorded and monitored were frontal, central and occipital EEG, electrooculogram (EOG), submentalis EMG (chin), nasal and oral airflow, thoracic and abdominal wall motion, anterior tibialis EMG, snore microphone, electrocardiogram, and pulse oximetry.  MEDICATIONS Medications self-administered by patient taken the night of the study : ALBUTEROL, FLUTICASONE, GABAPENTIN, mometasone-formoterol, ONE TOUCH TEST STRIPS  SLEEP ARCHITECTURE The study was initiated at 9:44:45 PM and ended at 5:16:41 AM.  Sleep onset time was 5.8 minutes and the sleep efficiency was 86.2%. The total sleep time was 389.5 minutes.  Stage REM latency was 216.5 minutes.  The patient spent 2.6% of the night in stage N1 sleep, 78.6% in stage N2 sleep, 0.0% in stage N3 and 18.9% in REM.  Alpha intrusion was absent.  Supine sleep was 81.39%.  RESPIRATORY PARAMETERS The overall apnea/hypopnea index (AHI) was 2.8 per hour. There were 0 total apneas, including 0 obstructive, 0 central and 0 mixed apneas. There were 18 hypopneas and 3 RERAs.  The AHI during Stage REM sleep was 13.9 per hour.  AHI while supine was 3.2 per hour.  The mean oxygen saturation was 93.2%. The minimum SpO2 during sleep was 84.0%.  moderate snoring was noted  during this study.  CARDIAC DATA The 2 lead EKG demonstrated sinus rhythm. The mean heart rate was 77.8 beats per minute. Other EKG findings include: PVCs.   LEG MOVEMENT DATA The total PLMS were 0 with a resulting PLMS index of 0.0. Associated arousal with leg movement index was 0.0 .  IMPRESSIONS - No significant obstructive sleep apnea occurred during this study (AHI = 2.8/h). - Mild oxygen desaturation was noted during this study (Min O2 = 84.0%). - The patient snored with moderate snoring volume. - EKG findings include PVCs. - Clinically significant periodic limb movements did not occur during sleep. No significant associated arousals.   DIAGNOSIS - Mild Nocturnal Hypoxemia (G47.36) - Simple snoring   RECOMMENDATIONS - Avoid alcohol, sedatives and other CNS depressants that may worsen sleep apnea and disrupt normal sleep architecture. - Sleep hygiene should be reviewed to assess factors that may improve sleep quality. - Weight management and regular exercise should be initiated or continued if appropriate.  [Electronically signed] 06/22/2023 08:10 PM  Cyril Mourning MD, ABSM Diplomate, American Board of Sleep Medicine NPI: [id number]

## 2023-06-26 ENCOUNTER — Other Ambulatory Visit: Payer: Self-pay | Admitting: Internal Medicine

## 2023-07-01 ENCOUNTER — Ambulatory Visit: Payer: Medicaid Other | Admitting: Nurse Practitioner

## 2023-07-01 NOTE — Progress Notes (Deleted)
ASSESSMENT & PLAN   47 y.o. yo female with multiple medical comorbidities known remotely to Dr. Lavon Paganini for history of iron deficiency anemia and GERD   History of morbid obesity, diabetes, hypertension, pulmonary sarcoidosis, COPD.   See PMH for any additional medical history   HPI   Brief GI History         Chief complaint :      Procedure risk assessment:  No history of CHF.  No supplemental 02 use at home.  Not a known difficult airway Anticoagulant:    Previous GI Studies   **May not be a complete list of studies     Labs      Latest Ref Rng & Units 06/01/2023    7:06 PM 04/04/2023   10:32 AM 04/01/2023   10:47 AM  CBC  WBC 4.0 - 10.5 K/uL 8.9  8.3  8.2   Hemoglobin 12.0 - 15.0 g/dL 82.9  56.2  13.0   Hematocrit 36.0 - 46.0 % 39.4  38.9  40.6   Platelets 150 - 400 K/uL 303  354  378.0     Lab Results  Component Value Date   LIPASE 71 (H) 06/01/2023      Latest Ref Rng & Units 06/01/2023    7:06 PM 04/04/2023   10:32 AM 04/01/2023   10:47 AM  CMP  Glucose 70 - 99 mg/dL 865  784  91   BUN 6 - 20 mg/dL 7  10  11    Creatinine 0.44 - 1.00 mg/dL 6.96  2.95  2.84   Sodium 135 - 145 mmol/L 137  135  137   Potassium 3.5 - 5.1 mmol/L 3.3  3.5  3.5   Chloride 98 - 111 mmol/L 101  100  101   CO2 22 - 32 mmol/L 28  25  26    Calcium 8.9 - 10.3 mg/dL 9.7  9.8  9.5   Total Protein 6.5 - 8.1 g/dL 7.0  7.3  7.5   Total Bilirubin 0.3 - 1.2 mg/dL 0.4  0.5  0.5   Alkaline Phos 38 - 126 U/L 111  108  117   AST 15 - 41 U/L 9  10  12    ALT 0 - 44 U/L 7  7  9          Split night study Oretha Milch, MD     06/22/2023  8:10 PM Patient Name: Nicole Melendez, Nicole Melendez Date: 06/17/2023 Gender: Female D.O.B: [birth year] Age (years): 47 Referring Provider: Levy Pupa Height (inches): 67 Interpreting Physician: Cyril Mourning MD, ABSM Weight (lbs): 285 RPSGT: Lowry Ram BMI: 45 MRN: [id number] Neck Size: 15.00 <br> <br> CLINICAL INFORMATION Sleep Study  Type: NPSG  Indication for sleep study: Congestive Heart Failure,  sarcoidosis, COPD, Diabetes, Hypertension, Obesity, OSA,  Re-Evaluation, Snoring  Epworth Sleepiness Score: 0  SLEEP STUDY TECHNIQUE As per the AASM Manual for the Scoring of Sleep and Associated  Events v2.3 (April 2016) with a hypopnea requiring 4%  desaturations.  The channels recorded and monitored were frontal, central and  occipital EEG, electrooculogram (EOG), submentalis EMG (chin),  nasal and oral airflow, thoracic and abdominal wall motion,  anterior tibialis EMG, snore microphone, electrocardiogram, and  pulse oximetry.  MEDICATIONS Medications self-administered by patient taken the night of the  study : ALBUTEROL, FLUTICASONE, GABAPENTIN,  mometasone-formoterol, ONE TOUCH TEST STRIPS  SLEEP ARCHITECTURE The study was initiated at 9:44:45 PM and ended at 5:16:41 AM.  Sleep onset time was 5.8 minutes and  the sleep efficiency was  86.2%. The total sleep time was 389.5 minutes.  Stage REM latency was 216.5 minutes.  The patient spent 2.6% of the night in stage N1 sleep, 78.6% in  stage N2 sleep, 0.0% in stage N3 and 18.9% in REM.  Alpha intrusion was absent.  Supine sleep was 81.39%.  RESPIRATORY PARAMETERS The overall apnea/hypopnea index (AHI) was 2.8 per hour. There  were 0 total apneas, including 0 obstructive, 0 central and 0  mixed apneas. There were 18 hypopneas and 3 RERAs.  The AHI during Stage REM sleep was 13.9 per hour.  AHI while supine was 3.2 per hour.  The mean oxygen saturation was 93.2%. The minimum SpO2 during  sleep was 84.0%.  moderate snoring was noted during this study.  CARDIAC DATA The 2 lead EKG demonstrated sinus rhythm. The mean heart rate was  77.8 beats per minute. Other EKG findings include: PVCs.  LEG MOVEMENT DATA The total PLMS were 0 with a resulting PLMS index of 0.0.  Associated arousal with leg movement index was 0.0 .  IMPRESSIONS - No  significant obstructive sleep apnea occurred during this  study (AHI = 2.8/h). - Mild oxygen desaturation was noted during this study (Min O2 =  84.0%). - The patient snored with moderate snoring volume. - EKG findings include PVCs. - Clinically significant periodic limb movements did not occur  during sleep. No significant associated arousals.  DIAGNOSIS - Mild Nocturnal Hypoxemia (G47.36) - Simple snoring  RECOMMENDATIONS - Avoid alcohol, sedatives and other CNS depressants that may  worsen sleep apnea and disrupt normal sleep architecture. - Sleep hygiene should be reviewed to assess factors that may  improve sleep quality. - Weight management and regular exercise should be initiated or  continued if appropriate.  [Electronically signed] 06/22/2023 08:10 PM  Cyril Mourning MD, ABSM Diplomate, American Board of Sleep Medicine NPI: [id number]    Past Medical History:  Diagnosis Date   Allergic rhinitis, cause unspecified    Anemia    Anxiety    Arthritis    COPD (chronic obstructive pulmonary disease) (HCC)    Depression    Diabetes mellitus type II    steroid related, patient states "im no longer diabetic"   Essential hypertension 08/08/2015   GERD (gastroesophageal reflux disease)    History of blood transfusion    Hyperlipidemia    Menorrhagia    Migraine    Morbid obesity (HCC)    Otitis media 06/28/2015   Peptic ulcer disease    Pneumonia    Positive ANA (antinuclear antibody) 02/14/2012   Sarcoid    including hand per rheumatology-Dr. 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(HCC)    Shortness of breath    on exertion   Varicose veins with pain    Past Surgical History:  Procedure Laterality Date   ABDOMINAL HYSTERECTOMY     COLONOSCOPY WITH PROPOFOL N/A 10/19/2016   Procedure: COLONOSCOPY WITH PROPOFOL;  Surgeon: Napoleon Form, MD;  Location: [facility];  Service: Endoscopy;  Laterality: N/A;   ESOPHAGOGASTRODUODENOSCOPY (EGD) WITH PROPOFOL N/A  10/19/2016   Procedure: ESOPHAGOGASTRODUODENOSCOPY (EGD) WITH PROPOFOL;  Surgeon: Napoleon Form, MD;  Location: [facility];  Service: Endoscopy;  Laterality: N/A;   HERNIA MESH REMOVAL  02/2013   HERNIA REPAIR     KNEE ARTHROSCOPY Left 01/15/2022   Procedure: ARTHROSCOPY KNEE;  Surgeon: Beverely Low, MD;  Location: [facility];  Service: Orthopedics;  Laterality: Left;   uterine ablation  03/2010   WISDOM TOOTH  EXTRACTION     Family History  Problem Relation Age of Onset   Allergies Mother    Heart attack Mother    Diabetes Mother    Diabetes Father    Heart disease Father    Rheum arthritis Father    Stroke Father    Hypertension Other    Ovarian cancer Maternal Aunt    Lung cancer Maternal Aunt    Breast cancer Maternal Aunt    Bone cancer Maternal Aunt    Stroke Maternal Uncle    Other Neg Hx    Social History   Tobacco Use   Smoking status: Former    Current packs/day: 0.00    Average packs/day: 1 pack/day for 20.0 years (20.0 ttl pk-yrs)    Types: Cigarettes    Start date: 10/25/1992    Quit date: 10/25/2012    Years since quitting: 10.6   Smokeless tobacco: Never   Tobacco comments:    QUIT 04/2010 AND STARTED BACK 2014 X 3 MONTHS. less than 1 ppd.  started at age 21.    Vaping Use   Vaping status: Never Used  Substance Use Topics   Alcohol use: No   Drug use: No   Current Outpatient Medications  Medication Sig Dispense Refill   acetaminophen (TYLENOL) 650 MG CR tablet Take 650-1,300 mg by mouth every 8 (eight) hours as needed for pain.     albuterol (VENTOLIN HFA) 108 (90 Base) MCG/ACT inhaler INHALE 2 PUFFS INTO THE LUNGS EVERY 4 HOURS AS NEEDED FOR WHEEZING OR SHORTNESS OF BREATH 8.5 g 0   ALPRAZolam (XANAX) 0.5 MG tablet TAKE 1 TABLET(0.5 MG) BY MOUTH TWICE DAILY AS NEEDED FOR ANXIETY 60 tablet 1   amoxicillin-clavulanate (AUGMENTIN) 875-125 MG tablet Take 1 tablet by mouth 2 (two) times daily. 14 tablet 0   azelastine (ASTELIN) 0.1 % nasal spray Place 2  sprays into both nostrils 2 (two) times daily. Use in each nostril as directed 30 mL 12   BD PEN NEEDLE NANO 2ND GEN 32G X 4 MM MISC USE AS DIRECTED THREE TIMES DAILY 300 each 3   Blood Glucose Monitoring Suppl (ONETOUCH VERIO) w/Device KIT Use as directed daily E11.9 1 kit 0   clindamycin (CLEOCIN) 150 MG capsule Take 150 mg by mouth 4 (four) times daily.     fluticasone (FLONASE) 50 MCG/ACT nasal spray Place 2 sprays into both nostrils daily. 16 g 0   gabapentin (NEURONTIN) 300 MG capsule 3 tab by mouth three times daily 270 capsule 1   glucose blood (ONETOUCH VERIO) test strip USE AS DIRECTED THREE TIMES DAILY E11.9 300 strip 11   insulin aspart (NOVOLOG FLEXPEN) 100 UNIT/ML FlexPen ADMINISTER 12 UNITS UNDER THE SKIN FOUR TIMES DAILY 15 mL 2   levofloxacin (LEVAQUIN) 500 MG tablet Take 1 tablet (500 mg total) by mouth daily. 7 tablet 0   mometasone-formoterol (DULERA) 200-5 MCG/ACT AERO Inhale 2 puffs into the lungs in the morning and at bedtime. 8.8 g 5   nystatin (MYCOSTATIN) 100000 UNIT/ML suspension 20cc three times a day for 3 days. Swish and Swallow 60 mL 0   ondansetron (ZOFRAN ODT) 4 MG disintegrating tablet Take 1 tablet (4 mg total) by mouth every 8 (eight) hours as needed for nausea or vomiting. 30 tablet 11   ondansetron (ZOFRAN) 4 MG/5ML solution Take 5 mLs (4 mg total) by mouth every 8 (eight) hours as needed for nausea or vomiting. 50 mL 0   oxyCODONE-acetaminophen (PERCOCET) 10-325 MG tablet Take 1  tablet by mouth every 4 (four) hours as needed for pain. 30 tablet 0   pantoprazole (PROTONIX) 40 MG tablet Take 1 tablet (40 mg total) by mouth daily. 90 tablet 3   Plecanatide (TRULANCE) 3 MG TABS 1 tab by mouth once daily 90 tablet 3   Rimegepant Sulfate (NURTEC) 75 MG TBDP 1 tab by mouth once daily as needed 16 tablet 11   rizatriptan (MAXALT-MLT) 10 MG disintegrating tablet Take 1 tablet earliest onset of migraine.  May repeat in 2 hours if needed.  Maximum 2 tablets in 24 hours 9  tablet 11   rosuvastatin (CRESTOR) 40 MG tablet 1 tab by mouth once daily 90 tablet 3   Semaglutide, 1 MG/DOSE, 4 MG/3ML SOPN Inject 1 mg as directed once a week. 3 mL 11   sertraline (ZOLOFT) 100 MG tablet Take 1 tablet (100 mg total) by mouth 2 (two) times daily. 60 tablet 11   telmisartan-hydrochlorothiazide (MICARDIS HCT) 40-12.5 MG tablet Take 1 tablet by mouth daily. 30 tablet 2   TURMERIC PO Take 1 tablet by mouth daily.     vitamin B-12 (CYANOCOBALAMIN) 1000 MCG tablet Take 1,000 mcg by mouth daily.     Vitamin D, Ergocalciferol, (DRISDOL) 1.25 MG (50000 UNIT) CAPS capsule Take by mouth.     XOPENEX HFA 45 MCG/ACT inhaler INHALE 1 TO 2 PUFFS INOT THE LUNGS EVERY 8 HOURS AS NEEDED FOR WHEEZING OR SHORTNESS OF BREATH, USE 2 PUFFS 3 TIMES DAILY THEN BACK TO HOME REGIMEN 15 g 0   zonisamide (ZONEGRAN) 100 MG capsule Take 2 capsules (200 mg total) by mouth daily. 180 capsule 3   No current facility-administered medications for this visit.   Allergies  Allergen Reactions   Azithromycin Hives    (z pak) hives   Bee Venom Anaphylaxis   Flagyl [Metronidazole] Hives and Shortness Of Breath   Penicillins Shortness Of Breath and Swelling    Has patient had a PCN reaction causing immediate rash, facial/tongue/throat swelling, SOB or lightheadedness with hypotension: Yes Has patient had a PCN reaction causing severe rash involving mucus membranes or skin necrosis: No Has patient had a PCN reaction that required hospitalization No Has patient had a PCN reaction occurring within the last 10 years: No If all of the above answers are "NO", then may proceed with Cephalosporin use.  REACTION: swelling and difficulty breathing   Shellfish Allergy Anaphylaxis   Klonopin [Clonazepam] Other (See Comments)    Memory difficulty   Cephalexin Hives and Swelling   Cephalosporins Hives     Review of Systems: Positive for ***.  All other systems reviewed and negative except where noted in HPI.   Wt  Readings from Last 3 Encounters:  06/17/23 285 lb (129.3 kg)  06/01/23 284 lb (128.8 kg)  05/11/23 269 lb (122 kg)    Physical Exam:  LMP 07/18/2011  Constitutional:  Pleasant, generally well appearing ***female in no acute distress. Psychiatric:  Normal mood and affect. Behavior is normal. EENT: Pupils normal.  Conjunctivae are normal. No scleral icterus. Neck supple.  Cardiovascular: Normal rate, regular rhythm.  Pulmonary/chest: Effort normal and breath sounds normal. No wheezing, rales or rhonchi. Abdominal: Soft, nondistended, nontender. Bowel sounds active throughout. There are no masses palpable. No hepatomegaly. Neurological: Alert and oriented to person place and time. Extremities: *** No edema Skin: Skin is warm and dry. No rashes noted.  Willette Cluster, NP  07/01/2023, 8:41 AM  Cc:  Referring Provider [name], MD

## 2023-07-04 DIAGNOSIS — F411 Generalized anxiety disorder: Secondary | ICD-10-CM | POA: Diagnosis not present

## 2023-07-04 DIAGNOSIS — M7061 Trochanteric bursitis, right hip: Secondary | ICD-10-CM | POA: Diagnosis not present

## 2023-07-04 DIAGNOSIS — M5440 Lumbago with sciatica, unspecified side: Secondary | ICD-10-CM | POA: Diagnosis not present

## 2023-07-04 DIAGNOSIS — E119 Type 2 diabetes mellitus without complications: Secondary | ICD-10-CM | POA: Diagnosis not present

## 2023-07-04 DIAGNOSIS — Z6841 Body Mass Index (BMI) 40.0 and over, adult: Secondary | ICD-10-CM | POA: Diagnosis not present

## 2023-07-04 DIAGNOSIS — F410 Panic disorder [episodic paroxysmal anxiety] without agoraphobia: Secondary | ICD-10-CM | POA: Diagnosis not present

## 2023-07-04 DIAGNOSIS — M539 Dorsopathy, unspecified: Secondary | ICD-10-CM | POA: Diagnosis not present

## 2023-07-06 ENCOUNTER — Ambulatory Visit
Admission: RE | Admit: 2023-07-06 | Discharge: 2023-07-06 | Disposition: A | Discharge status: Home / Self Care | Payer: Medicaid Other | Source: Ambulatory Visit

## 2023-07-06 VITALS — BP 91/62 | HR 84 | Temp 98.0°F | Resp 18 | Ht 67.0 in | Wt 281.0 lb

## 2023-07-06 DIAGNOSIS — R1032 Left lower quadrant pain: Secondary | ICD-10-CM

## 2023-07-06 DIAGNOSIS — R112 Nausea with vomiting, unspecified: Secondary | ICD-10-CM

## 2023-07-06 LAB — POCT URINALYSIS DIP (MANUAL ENTRY)
Bilirubin, UA: NEGATIVE
Glucose, UA: NEGATIVE mg/dL
Ketones, POC UA: NEGATIVE mg/dL
Leukocytes, UA: NEGATIVE
Nitrite, UA: NEGATIVE
Protein Ur, POC: NEGATIVE mg/dL
Spec Grav, UA: 1.02 (ref 1.010–1.025)
Urobilinogen, UA: 2 U/dL — AB
pH, UA: 6 (ref 5.0–8.0)

## 2023-07-06 NOTE — Discharge Instructions (Addendum)
Advised patient to go to [facility] for further evaluation of abdominal pain, to possibility include CT scan and blood work.

## 2023-07-06 NOTE — ED Triage Notes (Signed)
Patient c/o LLQ pain x 2 days and vomiting, no diarrhea.  Patient does have a history of pancreatis, cancelled appt w/GI doc because she was feeling better, then sx's reoccurred.  No BM.  Patient has taken Percocet and Tylenol Arthritis for pain.

## 2023-07-06 NOTE — ED Provider Notes (Signed)
[facility]    CSN: [id number] Arrival date & time: 07/06/23  1250      History   Chief Complaint Chief Complaint  Patient presents with   Abdominal Pain    Pain on my left side - Entered by patient   Emesis    HPI Nicole Melendez is a 46 y.o. female.   HPI 47 year old female presents with left lower quadrant pain for 2 days with vomiting.  Patient does have history of pancreatitis and canceled appoint with GI doctor because she was feeling better.  Patient reports taking Percocet and Tylenol arthritis for her abdominal pain.  PMH significant for morbid obesity, COPD, and T2DM.  Past Medical History:  Diagnosis Date   Allergic rhinitis, cause unspecified    Anemia    Anxiety    Arthritis    COPD (chronic obstructive pulmonary disease) (HCC)    Depression    Diabetes mellitus type II    steroid related, patient states "im no longer diabetic"   Essential hypertension 08/08/2015   GERD (gastroesophageal reflux disease)    History of blood transfusion    Hyperlipidemia    Menorrhagia    Migraine    Morbid obesity (HCC)    Otitis media 06/28/2015   Peptic ulcer disease    Pneumonia    Positive ANA (antinuclear antibody) 02/14/2012   Sarcoid    including hand per rheumatology-Dr. 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[facility])    Shortness of breath    on exertion   Varicose veins with pain     Patient Active Problem List   Diagnosis Date Noted   OSA (obstructive sleep apnea) 06/17/2023   Constipation 03/23/2023   Nail disorder 12/25/2022   Cellulitis of great toe 12/22/2022   Acute upper respiratory infection 09/06/2022   Vitamin D deficiency 10/21/2020   Right low back pain 10/15/2020   Recurrent falls while walking 10/15/2020   COVID-19 virus infection 09/28/2020   Exposure to COVID-19 virus 07/10/2020   Viral illness 07/10/2020   Sinusitis 05/23/2020   Allergic conjunctivitis 05/23/2020   Yeast vaginitis 05/23/2020   Polyarthralgia 02/20/2020    Tibialis posterior tendon tear, nontraumatic, right 02/08/2020   Neck pain 12/14/2019   COPD (chronic obstructive pulmonary disease) (HCC) 11/07/2019   Suspected COVID-19 virus infection 08/02/2019   Hematochezia 04/05/2019   Snoring 11/17/2018   Eye symptom 08/03/2018   Right lumbar radiculopathy 05/08/2018   Chronic pain 10/11/2017   Sarcoidosis of lung (HCC)    Acute on chronic diastolic CHF (congestive heart failure) (HCC) 09/22/2017   Rheumatoid arthritis (HCC) 09/18/2017   Chronic diastolic CHF (congestive heart failure) (HCC) 09/18/2017   Pancreatitis 06/26/2017   Right ankle sprain 06/26/2017   Possible exposure to STD 06/15/2017   Diarrhea 04/10/2017   Bilateral hand swelling 04/07/2017   Abdominal pain 04/07/2017   Abnormality of gait 01/26/2017   Avulsion fracture of right ankle 12/01/2016   Ulcer of esophagus with bleeding    Gastritis and gastroduodenitis    Blepharitis of right upper eyelid 10/09/2016   Peroneal tendinitis of lower leg, right 10/06/2016   Right ankle pain 09/16/2016   Dizziness 03/11/2016   Mixed headache 03/09/2016   Chronic venous insufficiency 02/24/2016   Gastroenteritis 12/30/2015   Headache 12/30/2015   Epistaxis 12/09/2015   Post concussive syndrome 12/03/2015   Cervical muscle strain 12/03/2015   Right-sided face pain 11/26/2015   Multiple contusions 11/26/2015   Neck pain, bilateral posterior 11/26/2015   Low back pain  11/26/2015   Spinal stenosis of lumbar region 10/28/2015   Chronic sciatica of left side 10/28/2015   DOE (dyspnea on exertion) 09/02/2015   Essential hypertension 08/08/2015   Diaphoresis 08/07/2015   Cough 07/02/2015   Vaginitis and vulvovaginitis 06/29/2015   Otitis media 06/28/2015   Thrush 06/24/2015   Nausea & vomiting 06/10/2015   Wheezing 11/14/2014   Angioedema 03/19/2014   Vaginitis 11/19/2012   Smoker 11/19/2012   Positive ANA (antinuclear antibody) 02/14/2012   Polyarthritis 02/11/2012   Migraine  04/05/2011   Encounter for well adult exam with abnormal findings 02/21/2011   Allergic rhinitis 02/17/2011   URTICARIA 09/29/2010   Depression 09/14/2010   Diabetes mellitus with neuropathy (HCC) 07/31/2010   PERIPHERAL EDEMA 07/31/2010   INSOMNIA-SLEEP DISORDER-UNSPEC 06/19/2010   MENORRHAGIA 05/15/2010   Hyperlipidemia 12/17/2009   Iron deficiency anemia 12/17/2009   Anxiety state 12/17/2009   Morbid obesity (HCC) 07/02/2007   Gastroesophageal reflux disease 07/02/2007   PEPTIC ULCER DISEASE 07/02/2007    Past Surgical History:  Procedure Laterality Date   ABDOMINAL HYSTERECTOMY     COLONOSCOPY WITH PROPOFOL N/A 10/19/2016   Procedure: COLONOSCOPY WITH PROPOFOL;  Surgeon: Napoleon Form, MD;  Location: [facility];  Service: Endoscopy;  Laterality: N/A;   ESOPHAGOGASTRODUODENOSCOPY (EGD) WITH PROPOFOL N/A 10/19/2016   Procedure: ESOPHAGOGASTRODUODENOSCOPY (EGD) WITH PROPOFOL;  Surgeon: Napoleon Form, MD;  Location: [facility];  Service: Endoscopy;  Laterality: N/A;   HERNIA MESH REMOVAL  02/2013   HERNIA REPAIR     KNEE ARTHROSCOPY Left 01/15/2022   Procedure: ARTHROSCOPY KNEE;  Surgeon: Beverely Low, MD;  Location: [facility];  Service: Orthopedics;  Laterality: Left;   uterine ablation  03/2010   WISDOM TOOTH EXTRACTION      OB History   No obstetric history on file.      Home Medications    Prior to Admission medications   Medication Sig Start Date End Date Taking? Authorizing Provider  acetaminophen (TYLENOL) 650 MG CR tablet Take 650-1,300 mg by mouth every 8 (eight) hours as needed for pain.   Yes [provider]  albuterol (VENTOLIN HFA) 108 (90 Base) MCG/ACT inhaler INHALE 2 PUFFS INTO THE LUNGS EVERY 4 HOURS AS NEEDED FOR WHEEZING OR SHORTNESS OF BREATH 03/31/20  Yes Byrum, Les Pou, MD  ALPRAZolam Prudy Feeler) 0.5 MG tablet TAKE 1 TABLET(0.5 MG) BY MOUTH TWICE DAILY AS NEEDED FOR ANXIETY 06/28/23  Yes [name], MD  azelastine (ASTELIN) 0.1 %  nasal spray Place 2 sprays into both nostrils 2 (two) times daily. Use in each nostril as directed 04/01/21  Yes [name], MD  BD PEN NEEDLE NANO 2ND GEN 32G X 4 MM MISC USE AS DIRECTED THREE TIMES DAILY 03/17/21  Yes [name], MD  Blood Glucose Monitoring Suppl [facility] VERIO) w/Device KIT Use as directed daily E11.9 03/25/23  Yes [name], MD  clindamycin (CLEOCIN) 150 MG capsule Take 150 mg by mouth 4 (four) times daily. 01/10/23  Yes [provider]  fluticasone (FLONASE) 50 MCG/ACT nasal spray Place 2 sprays into both nostrils daily. 05/18/23  Yes Eustace Moore, MD  gabapentin (NEURONTIN) 300 MG capsule 3 tab by mouth three times daily 04/20/23  Yes [name], MD  glucose blood [facility] VERIO) test strip USE AS DIRECTED THREE TIMES DAILY E11.9 03/22/23  Yes [name], MD  insulin aspart (NOVOLOG FLEXPEN) 100 UNIT/ML FlexPen ADMINISTER 12 UNITS UNDER THE SKIN FOUR TIMES DAILY 03/22/23  Yes [name]  W, MD  mometasone-formoterol (DULERA) 200-5 MCG/ACT AERO Inhale 2 puffs into the lungs in the morning and at bedtime. 05/11/23  Yes Leslye Peer, MD  nystatin (MYCOSTATIN) 100000 UNIT/ML suspension 20cc three times a day for 3 days. Swish and Swallow 06/06/23  Yes Leslye Peer, MD  ondansetron (ZOFRAN ODT) 4 MG disintegrating tablet Take 1 tablet (4 mg total) by mouth every 8 (eight) hours as needed for nausea or vomiting. 12/27/22  Yes Jaffe, Adam R, DO  ondansetron (ZOFRAN) 4 MG/5ML solution Take 5 mLs (4 mg total) by mouth every 8 (eight) hours as needed for nausea or vomiting. 06/01/23  Yes Jacalyn Lefevre, MD  oxyCODONE-acetaminophen (PERCOCET) 10-325 MG tablet Take 1 tablet by mouth every 4 (four) hours as needed for pain. 01/15/22  Yes Beverely Low, MD  pantoprazole (PROTONIX) 40 MG tablet Take 1 tablet (40 mg total) by mouth daily. 04/13/23  Yes [name], MD  Plecanatide [facility]) 3 MG TABS 1 tab by mouth once daily 03/22/23  Yes [name], MD  potassium  chloride SA (KLOR-CON M) 20 MEQ tablet Take 20 mEq by mouth daily. 07/04/23  Yes [provider]  Rimegepant Sulfate (NURTEC) 75 MG TBDP 1 tab by mouth once daily as needed 03/22/23  Yes [name], MD  rizatriptan (MAXALT-MLT) 10 MG disintegrating tablet Take 1 tablet earliest onset of migraine.  May repeat in 2 hours if needed.  Maximum 2 tablets in 24 hours 12/27/22  Yes Everlena Cooper, Adam R, DO  rosuvastatin (CRESTOR) 40 MG tablet 1 tab by mouth once daily 04/21/23  Yes [name], MD  Semaglutide, 1 MG/DOSE, 4 MG/3ML SOPN Inject 1 mg as directed once a week. 04/13/23  Yes [name], MD  sertraline (ZOLOFT) 100 MG tablet Take 1 tablet (100 mg total) by mouth 2 (two) times daily. 04/08/23  Yes [name], MD  telmisartan-hydrochlorothiazide (MICARDIS HCT) 40-12.5 MG tablet Take 1 tablet by mouth daily. 06/17/23  Yes [name], MD  TURMERIC PO Take 1 tablet by mouth daily.   Yes [provider]  vitamin B-12 (CYANOCOBALAMIN) 1000 MCG tablet Take 1,000 mcg by mouth daily.   Yes [provider]  Vitamin D, Ergocalciferol, (DRISDOL) 1.25 MG (50000 UNIT) CAPS capsule Take by mouth. 02/08/20  Yes [provider]  XOPENEX HFA 45 MCG/ACT inhaler INHALE 1 TO 2 PUFFS INOT THE LUNGS EVERY 8 HOURS AS NEEDED FOR WHEEZING OR SHORTNESS OF BREATH, USE 2 PUFFS 3 TIMES DAILY THEN BACK TO HOME REGIMEN 05/23/23  Yes Leslye Peer, MD  zonisamide (ZONEGRAN) 100 MG capsule Take 2 capsules (200 mg total) by mouth daily. 12/27/22  Yes Drema Dallas, DO    Family History Family History  Problem Relation Age of Onset   Allergies Mother    Heart attack Mother    Diabetes Mother    Diabetes Father    Heart disease Father    Rheum arthritis Father    Stroke Father    Hypertension Other    Ovarian cancer Maternal Aunt    Lung cancer Maternal Aunt    Breast cancer Maternal Aunt    Bone cancer Maternal Aunt    Stroke Maternal Uncle    Other Neg Hx     Social History Social  History   Tobacco Use   Smoking status: Former    Current packs/day: 0.00    Average packs/day: 1 pack/day for 20.0 years (20.0 ttl pk-yrs)  Types: Cigarettes    Start date: 10/25/1992    Quit date: 10/25/2012    Years since quitting: 10.7   Smokeless tobacco: Never   Tobacco comments:    QUIT 04/2010 AND STARTED BACK 2014 X 3 MONTHS. less than 1 ppd.  started at age 47.    Vaping Use   Vaping status: Never Used  Substance Use Topics   Alcohol use: No   Drug use: No     Allergies   Azithromycin, Bee venom, Flagyl [metronidazole], Penicillins, Shellfish allergy, Klonopin [clonazepam], Cephalexin, and Cephalosporins   Review of Systems Review of Systems   Physical Exam Triage Vital Signs ED Triage Vitals  Encounter Vitals Group     BP 07/06/23 1312 91/62     Systolic BP Percentile --      Diastolic BP Percentile --      Pulse Rate 07/06/23 1312 84     Resp 07/06/23 1312 18     Temp 07/06/23 1312 98 F (36.7 C)     Temp Source 07/06/23 1312 Oral     SpO2 07/06/23 1312 97 %     Weight 07/06/23 1315 281 lb (127.5 kg)     Height 07/06/23 1315 5\' 7"  (1.702 m)     Head Circumference --      Peak Flow --      Pain Score 07/06/23 1315 7     Pain Loc --      Pain Education --      Exclude from Growth Chart --    No data found.  Updated Vital Signs BP 91/62 (BP Location: Right Arm)   Pulse 84   Temp 98 F (36.7 C) (Oral)   Resp 18   Ht 5\' 7"  (1.702 m)   Wt 281 lb (127.5 kg)   LMP 07/18/2011   SpO2 97%   BMI 44.01 kg/m    Physical Exam Vitals and nursing note reviewed.  Constitutional:      General: She is not in acute distress.    Appearance: She is well-developed. She is obese. She is ill-appearing. She is not toxic-appearing or diaphoretic.  HENT:     Head: Normocephalic and atraumatic.     Mouth/Throat:     Mouth: Mucous membranes are moist.     Pharynx: Oropharynx is clear.  Eyes:     Extraocular Movements: Extraocular movements intact.     Pupils:  Pupils are equal, round, and reactive to light.  Cardiovascular:     Rate and Rhythm: Normal rate and regular rhythm.     Heart sounds: Normal heart sounds. No murmur heard. Pulmonary:     Effort: Pulmonary effort is normal.     Breath sounds: Normal breath sounds.  Abdominal:     General: Abdomen is flat. Bowel sounds are absent. There is no distension or abdominal bruit.     Palpations: Abdomen is soft. There is no shifting dullness, fluid wave, hepatomegaly, splenomegaly, mass or pulsatile mass.     Tenderness: There is abdominal tenderness in the left lower quadrant. There is no right CVA tenderness, left CVA tenderness, guarding or rebound. Negative signs include Murphy's sign, Rovsing's sign, McBurney's sign and psoas sign.     Hernia: No hernia is present.  Skin:    General: Skin is warm and dry.  Neurological:     General: No focal deficit present.     Mental Status: She is alert and oriented to person, place, and time.  Psychiatric:  Mood and Affect: Mood normal.        Behavior: Behavior normal.      UC Treatments / Results  Labs (all labs ordered are listed, but only abnormal results are displayed) Labs Reviewed  POCT URINALYSIS DIP (MANUAL ENTRY) - Abnormal; Notable for the following components:      Result Value   Color, UA light yellow (*)    Blood, UA trace-intact (*)    Urobilinogen, UA 2.0 (*)    All other components within normal limits    EKG   Radiology No results found.  Procedures Procedures (including critical care time)  Medications Ordered in UC Medications - No data to display  Initial Impression / Assessment and Plan / UC Course  I have reviewed the triage vital signs and the nursing notes.  Pertinent labs & imaging results that were available during my care of the patient were reviewed by me and considered in my medical decision making (see chart for details).     MDM: 1.  Abdominal pain, left lower quadrant-concerning for  diverticulitis, UA revealed above, advised patient to go to [facility] now for further evaluation which may include CT scan and blood work; 2.  Nausea and vomiting, unspecified vomiting type-patient reports currently not nauseous and was earlier.  Additionally, patient has history of pancreatitis and currently followed by GI; however, reports missing most recent appointment as she was feeling better. Advised patient to go to [facility] for further evaluation of abdominal pain, to possibility include CT scan and blood work.  Patient discharged to ED, hemodynamically stable.  Final Clinical Impressions(s) / UC Diagnoses   Final diagnoses:  Abdominal pain, left lower quadrant  Nausea and vomiting, unspecified vomiting type     Discharge Instructions      Advised patient to go to [facility] for further evaluation of abdominal pain, to possibility include CT scan and blood work.     ED Prescriptions   None    PDMP not reviewed this encounter.   Trevor Iha, FNP 07/06/23 1354

## 2023-07-20 ENCOUNTER — Ambulatory Visit: Payer: Medicaid Other | Admitting: Internal Medicine

## 2023-07-20 VITALS — BP 118/76 | HR 92 | Temp 99.2°F | Ht 67.0 in | Wt 282.2 lb

## 2023-07-20 DIAGNOSIS — Z23 Encounter for immunization: Secondary | ICD-10-CM

## 2023-07-20 DIAGNOSIS — Z794 Long term (current) use of insulin: Secondary | ICD-10-CM | POA: Diagnosis not present

## 2023-07-20 DIAGNOSIS — I10 Essential (primary) hypertension: Secondary | ICD-10-CM | POA: Diagnosis not present

## 2023-07-20 DIAGNOSIS — E538 Deficiency of other specified B group vitamins: Secondary | ICD-10-CM | POA: Diagnosis not present

## 2023-07-20 DIAGNOSIS — H6992 Unspecified Eustachian tube disorder, left ear: Secondary | ICD-10-CM | POA: Diagnosis not present

## 2023-07-20 DIAGNOSIS — E1140 Type 2 diabetes mellitus with diabetic neuropathy, unspecified: Secondary | ICD-10-CM

## 2023-07-20 DIAGNOSIS — E559 Vitamin D deficiency, unspecified: Secondary | ICD-10-CM

## 2023-07-20 DIAGNOSIS — H9202 Otalgia, left ear: Secondary | ICD-10-CM | POA: Diagnosis not present

## 2023-07-20 MED ORDER — FLUCONAZOLE 150 MG PO TABS: ORAL_TABLET | ORAL | 1 refills | Status: DC

## 2023-07-20 MED ORDER — LEVOFLOXACIN 500 MG PO TABS: 500.0000 mg | ORAL_TABLET | Freq: Every day | ORAL | 0 refills | Status: AC

## 2023-07-20 NOTE — Patient Instructions (Addendum)
You had the Prevnar 20 and flu shot today  Please take all new medication as prescribed  - the antibiotic, and diflucan if needed  Please continue all other medications as before, and refills have been done if requested.  Please have the pharmacy call with any other refills you may need.  Please keep your appointments with your specialists as you may have planned  You will be contacted regarding the referral for: ENT for possible ear tube need  We can hold on lab testing today  Please make an Appointment to return in Nov 20, or sooner if needed, also with Lab Appointment for testing done 3-5 days before at the FIRST FLOOR Lab (so this is for TWO appointments - please see the scheduling desk as you leave)

## 2023-07-20 NOTE — Progress Notes (Signed)
Patient ID: [id number] CRAPSER, female   DOB: 12/18/1975, 47 y.o.   MRN: [id number]        Chief Complaint: follow up left ear pain unusually severe, left eustachian dysfxn, low vit d, dm       HPI:  Nicole Melendez is a 47 y.o. female here with 3 mo left ear pain overall midl to mod but now severe 3/08 in the past week, had some piopping and crackling.  Has seen UC - flonase not helping, some tinnitus noted as well.  Pt denies chest pain, increased sob or doe, wheezing, orthopnea, PND, increased LE swelling, palpitations, dizziness or syncope.   Pt denies polydipsia, polyuria, or new focal neuro s/s.    Pt denies wt loss, night sweats, loss of appetite, or other constitutional symptoms  Due for prevnar 20 and flu shot today Wt Readings from Last 3 Encounters:  07/20/23 282 lb 3.2 oz (128 kg)  07/06/23 281 lb (127.5 kg)  06/17/23 285 lb (129.3 kg)   BP Readings from Last 3 Encounters:  07/20/23 118/76  07/06/23 91/62  06/01/23 97/70         Past Medical History:  Diagnosis Date   Allergic rhinitis, cause unspecified    Anemia    Anxiety    Arthritis    COPD (chronic obstructive pulmonary disease) (HCC)    Depression    Diabetes mellitus type II    steroid related, patient states "im no longer diabetic"   Essential hypertension 08/08/2015   GERD (gastroesophageal reflux disease)    History of blood transfusion    Hyperlipidemia    Menorrhagia    Migraine    Morbid obesity (HCC)    Otitis media 06/28/2015   Peptic ulcer disease    Pneumonia    Positive ANA (antinuclear antibody) 02/14/2012   Sarcoid    including hand per rheumatology-Dr. 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(HCC)    Shortness of breath    on exertion   Varicose veins with pain    Past Surgical History:  Procedure Laterality Date   ABDOMINAL HYSTERECTOMY     COLONOSCOPY WITH PROPOFOL N/A 10/19/2016   Procedure: COLONOSCOPY WITH PROPOFOL;  Surgeon: Napoleon Form, MD;  Location: [facility];  Service:  Endoscopy;  Laterality: N/A;   ESOPHAGOGASTRODUODENOSCOPY (EGD) WITH PROPOFOL N/A 10/19/2016   Procedure: ESOPHAGOGASTRODUODENOSCOPY (EGD) WITH PROPOFOL;  Surgeon: Napoleon Form, MD;  Location: [facility];  Service: Endoscopy;  Laterality: N/A;   HERNIA MESH REMOVAL  02/2013   HERNIA REPAIR     KNEE ARTHROSCOPY Left 01/15/2022   Procedure: ARTHROSCOPY KNEE;  Surgeon: Beverely Low, MD;  Location: [facility];  Service: Orthopedics;  Laterality: Left;   uterine ablation  03/2010   WISDOM TOOTH EXTRACTION      reports that she quit smoking about 10 years ago. Her smoking use included cigarettes. She started smoking about 30 years ago. She has a 20 pack-year smoking history. She has never used smokeless tobacco. She reports that she does not drink alcohol and does not use drugs. family history includes Allergies in her mother; Bone cancer in her maternal aunt; Breast cancer in her maternal aunt; Diabetes in her father and mother; Heart attack in her mother; Heart disease in her father; Hypertension in an other family member; Lung cancer in her maternal aunt; Ovarian cancer in her maternal aunt; Rheum arthritis in her father; Stroke in her father and maternal uncle. Allergies  Allergen Reactions   Azithromycin Hives    (  z pak) hives   Bee Venom Anaphylaxis   Flagyl [Metronidazole] Hives and Shortness Of Breath   Penicillins Shortness Of Breath and Swelling    Has patient had a PCN reaction causing immediate rash, facial/tongue/throat swelling, SOB or lightheadedness with hypotension: Yes Has patient had a PCN reaction causing severe rash involving mucus membranes or skin necrosis: No Has patient had a PCN reaction that required hospitalization No Has patient had a PCN reaction occurring within the last 10 years: No If all of the above answers are "NO", then may proceed with Cephalosporin use.  REACTION: swelling and difficulty breathing   Shellfish Allergy Anaphylaxis   Klonopin [Clonazepam]  Other (See Comments)    Memory difficulty   Cephalexin Hives and Swelling   Cephalosporins Hives   Current Outpatient Medications on File Prior to Visit  Medication Sig Dispense Refill   acetaminophen (TYLENOL) 650 MG CR tablet Take 650-1,300 mg by mouth every 8 (eight) hours as needed for pain.     albuterol (VENTOLIN HFA) 108 (90 Base) MCG/ACT inhaler INHALE 2 PUFFS INTO THE LUNGS EVERY 4 HOURS AS NEEDED FOR WHEEZING OR SHORTNESS OF BREATH 8.5 g 0   ALPRAZolam (XANAX) 0.5 MG tablet TAKE 1 TABLET(0.5 MG) BY MOUTH TWICE DAILY AS NEEDED FOR ANXIETY 60 tablet 1   azelastine (ASTELIN) 0.1 % nasal spray Place 2 sprays into both nostrils 2 (two) times daily. Use in each nostril as directed 30 mL 12   BD PEN NEEDLE NANO 2ND GEN 32G X 4 MM MISC USE AS DIRECTED THREE TIMES DAILY 300 each 3   Blood Glucose Monitoring Suppl (ONETOUCH VERIO) w/Device KIT Use as directed daily E11.9 1 kit 0   clindamycin (CLEOCIN) 150 MG capsule Take 150 mg by mouth 4 (four) times daily.     fluticasone (FLONASE) 50 MCG/ACT nasal spray Place 2 sprays into both nostrils daily. 16 g 0   gabapentin (NEURONTIN) 300 MG capsule 3 tab by mouth three times daily 270 capsule 1   glucose blood (ONETOUCH VERIO) test strip USE AS DIRECTED THREE TIMES DAILY E11.9 300 strip 11   insulin aspart (NOVOLOG FLEXPEN) 100 UNIT/ML FlexPen ADMINISTER 12 UNITS UNDER THE SKIN FOUR TIMES DAILY 15 mL 2   mometasone-formoterol (DULERA) 200-5 MCG/ACT AERO Inhale 2 puffs into the lungs in the morning and at bedtime. 8.8 g 5   nystatin (MYCOSTATIN) 100000 UNIT/ML suspension 20cc three times a day for 3 days. Swish and Swallow 60 mL 0   ondansetron (ZOFRAN ODT) 4 MG disintegrating tablet Take 1 tablet (4 mg total) by mouth every 8 (eight) hours as needed for nausea or vomiting. 30 tablet 11   ondansetron (ZOFRAN) 4 MG/5ML solution Take 5 mLs (4 mg total) by mouth every 8 (eight) hours as needed for nausea or vomiting. 50 mL 0   oxyCODONE-acetaminophen  (PERCOCET) 10-325 MG tablet Take 1 tablet by mouth every 4 (four) hours as needed for pain. 30 tablet 0   pantoprazole (PROTONIX) 40 MG tablet Take 1 tablet (40 mg total) by mouth daily. 90 tablet 3   Plecanatide (TRULANCE) 3 MG TABS 1 tab by mouth once daily 90 tablet 3   potassium chloride SA (KLOR-CON M) 20 MEQ tablet Take 20 mEq by mouth daily.     Rimegepant Sulfate (NURTEC) 75 MG TBDP 1 tab by mouth once daily as needed 16 tablet 11   rizatriptan (MAXALT-MLT) 10 MG disintegrating tablet Take 1 tablet earliest onset of migraine.  May repeat in 2 hours if  needed.  Maximum 2 tablets in 24 hours 9 tablet 11   rosuvastatin (CRESTOR) 40 MG tablet 1 tab by mouth once daily 90 tablet 3   Semaglutide, 1 MG/DOSE, 4 MG/3ML SOPN Inject 1 mg as directed once a week. 3 mL 11   sertraline (ZOLOFT) 100 MG tablet Take 1 tablet (100 mg total) by mouth 2 (two) times daily. 60 tablet 11   telmisartan-hydrochlorothiazide (MICARDIS HCT) 40-12.5 MG tablet Take 1 tablet by mouth daily. 30 tablet 2   TURMERIC PO Take 1 tablet by mouth daily.     vitamin B-12 (CYANOCOBALAMIN) 1000 MCG tablet Take 1,000 mcg by mouth daily.     Vitamin D, Ergocalciferol, (DRISDOL) 1.25 MG (50000 UNIT) CAPS capsule Take by mouth.     XOPENEX HFA 45 MCG/ACT inhaler INHALE 1 TO 2 PUFFS INOT THE LUNGS EVERY 8 HOURS AS NEEDED FOR WHEEZING OR SHORTNESS OF BREATH, USE 2 PUFFS 3 TIMES DAILY THEN BACK TO HOME REGIMEN 15 g 0   zonisamide (ZONEGRAN) 100 MG capsule Take 2 capsules (200 mg total) by mouth daily. 180 capsule 3   No current facility-administered medications on file prior to visit.        ROS:  All others reviewed and negative.  Objective        PE:  BP 118/76 (BP Location: Left Arm, Patient Position: Sitting, Cuff Size: Normal)   Pulse 92   Temp 99.2 F (37.3 C) (Oral)   Ht 5\' 7"  (1.702 m)   Wt 282 lb 3.2 oz (128 kg)   LMP 07/18/2011   SpO2 97%   BMI 44.20 kg/m                 Constitutional: Pt appears in NAD                HENT: Head: NCAT.                Right Ear: External ear normal.                 Left Ear: External ear normal.  Left Tm severe erythema with effusion               Eyes: . Pupils are equal, round, and reactive to light. Conjunctivae and EOM are normal               Nose: without d/c or deformity               Neck: Neck supple. Gross normal ROM               Cardiovascular: Normal rate and regular rhythm.                 Pulmonary/Chest: Effort normal and breath sounds without rales or wheezing.                Abd:  Soft, NT, ND, + BS, no organomegaly               Neurological: Pt is alert. At baseline orientation, motor grossly intact               Skin: Skin is warm. No rashes, no other new lesions, LE edema - none               Psychiatric: Pt behavior is normal without agitation   Micro: none  Cardiac tracings I have personally interpreted today:  none  Pertinent Radiological findings (summarize): none   Lab Results  Component Value Date   WBC 8.9 06/01/2023   HGB 13.5 06/01/2023   HCT 39.4 06/01/2023   PLT 303 06/01/2023   GLUCOSE 105 (H) 06/01/2023   CHOL 155 12/22/2022   TRIG 87.0 12/22/2022   HDL 50.60 12/22/2022   LDLDIRECT 135.0 07/05/2011   LDLCALC 87 12/22/2022   ALT 7 06/01/2023   AST 9 (L) 06/01/2023   NA 137 06/01/2023   K 3.3 (L) 06/01/2023   CL 101 06/01/2023   CREATININE 0.89 06/01/2023   BUN 7 06/01/2023   CO2 28 06/01/2023   TSH 1.69 12/22/2022   INR 1.3 11/17/2012   HGBA1C 6.2 (A) 03/22/2023   MICROALBUR <0.7 12/22/2022   Assessment/Plan:  Nicole Melendez is a 47 y.o. Black or African American [2] female with  has a past medical history of Allergic rhinitis, cause unspecified, Anemia, Anxiety, Arthritis, COPD (chronic obstructive pulmonary disease) (HCC), Depression, Diabetes mellitus type II, Essential hypertension (08/08/2015), GERD (gastroesophageal reflux disease), History of blood transfusion, Hyperlipidemia, Menorrhagia, Migraine, Morbid  obesity (HCC), Otitis media (06/28/2015), Peptic ulcer disease, Pneumonia, Positive ANA (antinuclear antibody) (02/14/2012), Sarcoid, Sarcoidosis of lung (HCC), Shortness of breath, and Varicose veins with pain.  Diabetes mellitus with neuropathy (HCC) Lab Results  Component Value Date   HGBA1C 6.2 (A) 03/22/2023   Stable, pt to continue current medical treatment novolog 12 u qid, ozemipc 1 mg weekly   Essential hypertension BP Readings from Last 3 Encounters:  07/20/23 118/76  07/06/23 91/62  06/01/23 97/70   Stable, pt to continue medical treatment micardis 40 12.5 qd   Vitamin D deficiency Last vitamin D Lab Results  Component Value Date   VD25OH 14.37 (L) 12/22/2022   Low, to start oral replacement   Left ear pain Severe with effusion and left eustachian dysfxn, for levaquin 500 every day, diflucan 150 prn, refer ENT, mucinex bid prn  Followup: Return in about 8 weeks (around 09/14/2023).  [name], MD 07/23/2023 8:07 PM [facility]

## 2023-07-23 ENCOUNTER — Encounter: Payer: Self-pay | Admitting: Internal Medicine

## 2023-07-23 DIAGNOSIS — H9202 Otalgia, left ear: Secondary | ICD-10-CM | POA: Insufficient documentation

## 2023-07-23 NOTE — Assessment & Plan Note (Signed)
Last vitamin D Lab Results  Component Value Date   VD25OH 14.37 (L) 12/22/2022   Low, to start oral replacement  

## 2023-07-23 NOTE — Assessment & Plan Note (Signed)
BP Readings from Last 3 Encounters:  07/20/23 118/76  07/06/23 91/62  06/01/23 97/70   Stable, pt to continue medical treatment micardis 40 12.5 qd

## 2023-07-23 NOTE — Assessment & Plan Note (Signed)
Severe with effusion and left eustachian dysfxn, for levaquin 500 every day, diflucan 150 prn, refer ENT, mucinex bid prn

## 2023-07-23 NOTE — Assessment & Plan Note (Signed)
Lab Results  Component Value Date   HGBA1C 6.2 (A) 03/22/2023   Stable, pt to continue current medical treatment novolog 12 u qid, ozemipc 1 mg weekly

## 2023-07-26 ENCOUNTER — Telehealth: Payer: Self-pay | Admitting: Internal Medicine

## 2023-07-26 ENCOUNTER — Other Ambulatory Visit: Payer: Self-pay

## 2023-07-26 ENCOUNTER — Emergency Department (HOSPITAL_BASED_OUTPATIENT_CLINIC_OR_DEPARTMENT_OTHER): Payer: Medicaid Other

## 2023-07-26 ENCOUNTER — Emergency Department (HOSPITAL_BASED_OUTPATIENT_CLINIC_OR_DEPARTMENT_OTHER)
Admission: EM | Admit: 2023-07-26 | Discharge: 2023-07-26 | Disposition: A | Discharge status: Home / Self Care | Payer: Medicaid Other | Attending: Emergency Medicine | Admitting: Emergency Medicine

## 2023-07-26 ENCOUNTER — Encounter (HOSPITAL_BASED_OUTPATIENT_CLINIC_OR_DEPARTMENT_OTHER): Payer: Self-pay | Admitting: Emergency Medicine

## 2023-07-26 DIAGNOSIS — Z794 Long term (current) use of insulin: Secondary | ICD-10-CM | POA: Insufficient documentation

## 2023-07-26 DIAGNOSIS — I10 Essential (primary) hypertension: Secondary | ICD-10-CM | POA: Diagnosis not present

## 2023-07-26 DIAGNOSIS — R1013 Epigastric pain: Secondary | ICD-10-CM | POA: Insufficient documentation

## 2023-07-26 DIAGNOSIS — Z20822 Contact with and (suspected) exposure to covid-19: Secondary | ICD-10-CM | POA: Diagnosis not present

## 2023-07-26 DIAGNOSIS — K429 Umbilical hernia without obstruction or gangrene: Secondary | ICD-10-CM | POA: Diagnosis not present

## 2023-07-26 DIAGNOSIS — Z7984 Long term (current) use of oral hypoglycemic drugs: Secondary | ICD-10-CM | POA: Diagnosis not present

## 2023-07-26 DIAGNOSIS — Z79899 Other long term (current) drug therapy: Secondary | ICD-10-CM | POA: Diagnosis not present

## 2023-07-26 DIAGNOSIS — J449 Chronic obstructive pulmonary disease, unspecified: Secondary | ICD-10-CM | POA: Insufficient documentation

## 2023-07-26 DIAGNOSIS — Z7951 Long term (current) use of inhaled steroids: Secondary | ICD-10-CM | POA: Insufficient documentation

## 2023-07-26 DIAGNOSIS — E119 Type 2 diabetes mellitus without complications: Secondary | ICD-10-CM | POA: Diagnosis not present

## 2023-07-26 DIAGNOSIS — R748 Abnormal levels of other serum enzymes: Secondary | ICD-10-CM | POA: Diagnosis not present

## 2023-07-26 DIAGNOSIS — K859 Acute pancreatitis without necrosis or infection, unspecified: Secondary | ICD-10-CM | POA: Diagnosis not present

## 2023-07-26 DIAGNOSIS — R112 Nausea with vomiting, unspecified: Secondary | ICD-10-CM | POA: Insufficient documentation

## 2023-07-26 DIAGNOSIS — R109 Unspecified abdominal pain: Secondary | ICD-10-CM | POA: Diagnosis not present

## 2023-07-26 LAB — URINALYSIS, ROUTINE W REFLEX MICROSCOPIC
Bilirubin Urine: NEGATIVE
Glucose, UA: NEGATIVE mg/dL
Hgb urine dipstick: NEGATIVE
Ketones, ur: NEGATIVE mg/dL
Leukocytes,Ua: NEGATIVE
Nitrite: NEGATIVE
Protein, ur: NEGATIVE mg/dL
Specific Gravity, Urine: 1.016 (ref 1.005–1.030)
pH: 5.5 (ref 5.0–8.0)

## 2023-07-26 LAB — RESP PANEL BY RT-PCR (RSV, FLU A&B, COVID)  RVPGX2
Influenza A by PCR: NEGATIVE
Influenza B by PCR: NEGATIVE
Resp Syncytial Virus by PCR: NEGATIVE
SARS Coronavirus 2 by RT PCR: NEGATIVE

## 2023-07-26 LAB — CBC WITH DIFFERENTIAL/PLATELET
Abs Immature Granulocytes: 0.03 10*3/uL (ref 0.00–0.07)
Basophils Absolute: 0.1 10*3/uL (ref 0.0–0.1)
Basophils Relative: 1 %
Eosinophils Absolute: 0.2 10*3/uL (ref 0.0–0.5)
Eosinophils Relative: 3 %
HCT: 41.6 % (ref 36.0–46.0)
Hemoglobin: 14.2 g/dL (ref 12.0–15.0)
Immature Granulocytes: 0 %
Lymphocytes Relative: 37 %
Lymphs Abs: 2.8 10*3/uL (ref 0.7–4.0)
MCH: 28.9 pg (ref 26.0–34.0)
MCHC: 34.1 g/dL (ref 30.0–36.0)
MCV: 84.6 fL (ref 80.0–100.0)
Monocytes Absolute: 0.4 10*3/uL (ref 0.1–1.0)
Monocytes Relative: 5 %
Neutro Abs: 4.2 10*3/uL (ref 1.7–7.7)
Neutrophils Relative %: 54 %
Platelets: 312 10*3/uL (ref 150–400)
RBC: 4.92 MIL/uL (ref 3.87–5.11)
RDW: 13.8 % (ref 11.5–15.5)
WBC: 7.7 10*3/uL (ref 4.0–10.5)
nRBC: 0 % (ref 0.0–0.2)

## 2023-07-26 LAB — COMPREHENSIVE METABOLIC PANEL
ALT: 8 U/L (ref 0–44)
AST: 14 U/L — ABNORMAL LOW (ref 15–41)
Albumin: 4.4 g/dL (ref 3.5–5.0)
Alkaline Phosphatase: 109 U/L (ref 38–126)
Anion gap: 10 (ref 5–15)
BUN: 11 mg/dL (ref 6–20)
CO2: 25 mmol/L (ref 22–32)
Calcium: 9.9 mg/dL (ref 8.9–10.3)
Chloride: 102 mmol/L (ref 98–111)
Creatinine, Ser: 0.81 mg/dL (ref 0.44–1.00)
GFR, Estimated: >60 mL/min (ref >60)
Glucose, Bld: 86 mg/dL (ref 70–99)
Potassium: 4.8 mmol/L (ref 3.5–5.1)
Sodium: 137 mmol/L (ref 135–145)
Total Bilirubin: 0.6 mg/dL (ref 0.3–1.2)
Total Protein: 8.2 g/dL — ABNORMAL HIGH (ref 6.5–8.1)

## 2023-07-26 LAB — LIPASE, BLOOD: Lipase: 65 U/L — ABNORMAL HIGH (ref 11–51)

## 2023-07-26 LAB — PREGNANCY, URINE: Preg Test, Ur: NEGATIVE

## 2023-07-26 MED ORDER — ONDANSETRON 4 MG PO TBDP: 4.0000 mg | ORAL_TABLET | Freq: Three times a day (TID) | ORAL | 0 refills | Status: DC | PRN

## 2023-07-26 MED ORDER — FAMOTIDINE 20 MG PO TABS: 20.0000 mg | ORAL_TABLET | Freq: Every day | ORAL | 0 refills | Status: DC

## 2023-07-26 MED ORDER — ONDANSETRON HCL 4 MG/2ML IJ SOLN
4.0000 mg | Freq: Once | INTRAMUSCULAR | Status: AC
  Administered 2023-07-26: 4 mg via INTRAVENOUS
  Filled 2023-07-26: qty 2

## 2023-07-26 MED ORDER — LACTATED RINGERS IV BOLUS
1000.0000 mL | Freq: Once | INTRAVENOUS | Status: AC
  Administered 2023-07-26: 1000 mL via INTRAVENOUS

## 2023-07-26 MED ORDER — IOHEXOL 300 MG/ML  SOLN
100.0000 mL | Freq: Once | INTRAMUSCULAR | Status: AC | PRN
  Administered 2023-07-26: 100 mL via INTRAVENOUS

## 2023-07-26 MED ORDER — FENTANYL CITRATE PF 50 MCG/ML IJ SOSY
50.0000 ug | PREFILLED_SYRINGE | Freq: Once | INTRAMUSCULAR | Status: AC
  Administered 2023-07-26: 50 ug via INTRAVENOUS
  Filled 2023-07-26: qty 1

## 2023-07-26 NOTE — ED Notes (Signed)
 RN reviewed discharge instructions with pt. Pt verbalized understanding and had no further questions. VSS upon discharge.  

## 2023-07-26 NOTE — ED Triage Notes (Signed)
Vomiting x 2 day, not keeping anything down Left side abo pain Hx pancreatitis

## 2023-07-26 NOTE — Telephone Encounter (Signed)
Pt was seen at Er, will hold on further lab testing, thanks

## 2023-07-26 NOTE — ED Provider Notes (Signed)
[place] EMERGENCY DEPARTMENT AT [facility] Provider Note   CSN: [id number] Arrival date & time: 07/26/23  1457     History  Chief Complaint  Patient presents with   Emesis    Nicole Melendez is a 47 y.o. female.   Emesis Associated symptoms: abdominal pain      47 year old female with medical history significant for DM2, COPD, sarcoidosis, HTN, obesity, peptic ulcer disease, GERD, anxiety, depression, previous episodes of pancreatitis who presents to the emergency department with epigastric abdominal pain, nausea and vomiting.  The patient states that she has been unable to keep any solids down over the past 2 days.  She has been able to tolerate liquids.  She endorses epigastric abdominal pain that radiates to her back and somewhat to her left upper quadrant.  She is moving her bowels and passing gas.  Feels like prior episodes of pancreatitis.  Home Medications Prior to Admission medications   Medication Sig Start Date End Date Taking? Authorizing Provider  famotidine (PEPCID) 20 MG tablet Take 1 tablet (20 mg total) by mouth daily. 07/26/23  Yes Ernie Avena, MD  ondansetron (ZOFRAN-ODT) 4 MG disintegrating tablet Take 1 tablet (4 mg total) by mouth every 8 (eight) hours as needed. 07/26/23  Yes Ernie Avena, MD  acetaminophen (TYLENOL) 650 MG CR tablet Take 650-1,300 mg by mouth every 8 (eight) hours as needed for pain.    [provider]  albuterol (VENTOLIN HFA) 108 (90 Base) MCG/ACT inhaler INHALE 2 PUFFS INTO THE LUNGS EVERY 4 HOURS AS NEEDED FOR WHEEZING OR SHORTNESS OF BREATH 03/31/20   Byrum, Les Pou, MD  ALPRAZolam Prudy Feeler) 0.5 MG tablet TAKE 1 TABLET(0.5 MG) BY MOUTH TWICE DAILY AS NEEDED FOR ANXIETY 06/28/23   [name], MD  azelastine (ASTELIN) 0.1 % nasal spray Place 2 sprays into both nostrils 2 (two) times daily. Use in each nostril as directed 04/01/21   [name], MD  BD PEN NEEDLE NANO 2ND GEN 32G X 4 MM MISC USE AS DIRECTED THREE TIMES  DAILY 03/17/21   [name], MD  Blood Glucose Monitoring Suppl [facility] VERIO) w/Device KIT Use as directed daily E11.9 03/25/23   [name], MD  clindamycin (CLEOCIN) 150 MG capsule Take 150 mg by mouth 4 (four) times daily. 01/10/23   [provider]  fluconazole (DIFLUCAN) 150 MG tablet 1 tab by mouth every 3 days as needed 07/20/23   [name], MD  fluticasone [facility]) 50 MCG/ACT nasal spray Place 2 sprays into both nostrils daily. 05/18/23   Eustace Moore, MD  gabapentin (NEURONTIN) 300 MG capsule 3 tab by mouth three times daily 04/20/23   [name], MD  glucose blood [facility] VERIO) test strip USE AS DIRECTED THREE TIMES DAILY E11.9 03/22/23   [name], MD  insulin aspart (NOVOLOG FLEXPEN) 100 UNIT/ML FlexPen ADMINISTER 12 UNITS UNDER THE SKIN FOUR TIMES DAILY 03/22/23   [name], MD  levofloxacin (LEVAQUIN) 500 MG tablet Take 1 tablet (500 mg total) by mouth daily for 10 days. 07/20/23 07/30/23  [name], MD  mometasone-formoterol (DULERA) 200-5 MCG/ACT AERO Inhale 2 puffs into the lungs in the morning and at bedtime. 05/11/23   Leslye Peer, MD  nystatin (MYCOSTATIN) 100000 UNIT/ML suspension 20cc three times a day for 3 days. Swish and Swallow 06/06/23   Leslye Peer, MD  ondansetron [facility]) 4 MG/5ML solution Take 5 mLs (4 mg total) by mouth every 8 (  eight) hours as needed for nausea or vomiting. 06/01/23   Jacalyn Lefevre, MD  oxyCODONE-acetaminophen (PERCOCET) 10-325 MG tablet Take 1 tablet by mouth every 4 (four) hours as needed for pain. 01/15/22   Beverely Low, MD  pantoprazole (PROTONIX) 40 MG tablet Take 1 tablet (40 mg total) by mouth daily. 04/13/23   [name], MD  Plecanatide (TRULANCE) 3 MG TABS 1 tab by mouth once daily 03/22/23   [name], MD  potassium chloride SA (KLOR-CON M) 20 MEQ tablet Take 20 mEq by mouth daily. 07/04/23   [provider]  Rimegepant Sulfate (NURTEC) 75 MG TBDP 1 tab by mouth once daily as needed  03/22/23   [name], MD  rizatriptan (MAXALT-MLT) 10 MG disintegrating tablet Take 1 tablet earliest onset of migraine.  May repeat in 2 hours if needed.  Maximum 2 tablets in 24 hours 12/27/22   Drema Dallas, DO  rosuvastatin (CRESTOR) 40 MG tablet 1 tab by mouth once daily 04/21/23   [name], MD  Semaglutide, 1 MG/DOSE, 4 MG/3ML SOPN Inject 1 mg as directed once a week. 04/13/23   [name], MD  sertraline (ZOLOFT) 100 MG tablet Take 1 tablet (100 mg total) by mouth 2 (two) times daily. 04/08/23   [name], MD  telmisartan-hydrochlorothiazide (MICARDIS HCT) 40-12.5 MG tablet Take 1 tablet by mouth daily. 06/17/23   [name], MD  TURMERIC PO Take 1 tablet by mouth daily.    [provider]  vitamin B-12 (CYANOCOBALAMIN) 1000 MCG tablet Take 1,000 mcg by mouth daily.    [provider]  Vitamin D, Ergocalciferol, (DRISDOL) 1.25 MG (50000 UNIT) CAPS capsule Take by mouth. 02/08/20   [provider]  XOPENEX HFA 45 MCG/ACT inhaler INHALE 1 TO 2 PUFFS INOT THE LUNGS EVERY 8 HOURS AS NEEDED FOR WHEEZING OR SHORTNESS OF BREATH, USE 2 PUFFS 3 TIMES DAILY THEN BACK TO HOME REGIMEN 05/23/23   Leslye Peer, MD  zonisamide (ZONEGRAN) 100 MG capsule Take 2 capsules (200 mg total) by mouth daily. 12/27/22   Drema Dallas, DO      Allergies    Azithromycin, Bee venom, Flagyl [metronidazole], Penicillins, Shellfish allergy, Klonopin [clonazepam], Cephalexin, and Cephalosporins    Review of Systems   Review of Systems  Gastrointestinal:  Positive for abdominal pain and vomiting.  All other systems reviewed and are negative.   Physical Exam Updated Vital Signs BP 111/79   Pulse 69   Temp 98.1 F (36.7 C) (Oral)   Resp 18   LMP 07/18/2011   SpO2 97%  Physical Exam Vitals and nursing note reviewed.  Constitutional:      General: She is not in acute distress. HENT:     Head: Normocephalic and atraumatic.  Eyes:     Conjunctiva/sclera: Conjunctivae  normal.     Pupils: Pupils are equal, round, and reactive to light.  Cardiovascular:     Rate and Rhythm: Normal rate and regular rhythm.  Pulmonary:     Effort: Pulmonary effort is normal. No respiratory distress.  Abdominal:     General: There is no distension.     Tenderness: There is abdominal tenderness. There is no guarding.  Musculoskeletal:        General: No deformity or signs of injury.     Cervical back: Neck supple.  Skin:    Findings: No lesion or rash.  Neurological:     General: No focal deficit present.  Mental Status: She is alert. Mental status is at baseline.     ED Results / Procedures / Treatments   Labs (all labs ordered are listed, but only abnormal results are displayed) Labs Reviewed  COMPREHENSIVE METABOLIC PANEL - Abnormal; Notable for the following components:      Result Value   Total Protein 8.2 (*)    AST 14 (*)    All other components within normal limits  LIPASE, BLOOD - Abnormal; Notable for the following components:   Lipase 65 (*)    All other components within normal limits  RESP PANEL BY RT-PCR (RSV, FLU A&B, COVID)  RVPGX2  CBC WITH DIFFERENTIAL/PLATELET  URINALYSIS, ROUTINE W REFLEX MICROSCOPIC  PREGNANCY, URINE    EKG None  Radiology CT ABDOMEN PELVIS W CONTRAST  Result Date: 07/26/2023 CLINICAL DATA:  Pancreatitis, acute, severe. Left lower quadrant pain. Nausea, vomiting. EXAM: CT ABDOMEN AND PELVIS WITH CONTRAST TECHNIQUE: Multidetector CT imaging of the abdomen and pelvis was performed using the standard protocol following bolus administration of intravenous contrast. RADIATION DOSE REDUCTION: This exam was performed according to the departmental dose-optimization program which includes automated exposure control, adjustment of the mA and/or kV according to patient size and/or use of iterative reconstruction technique. CONTRAST:  OMNIPAQUE IOHEXOL 300 MG/ML  SOLN COMPARISON:  06/01/2023 FINDINGS: Lower chest: No acute  abnormality Hepatobiliary: No focal hepatic abnormality. Gallbladder unremarkable. Pancreas: No focal abnormality or ductal dilatation. No surrounding inflammation. Spleen: No focal abnormality.  Normal size. Adrenals/Urinary Tract: No adrenal abnormality. No focal renal abnormality. No stones or hydronephrosis. Urinary bladder is unremarkable. Stomach/Bowel: Normal appendix. Prior gastric sleeve. Stomach, large and small bowel grossly unremarkable. Vascular/Lymphatic: No evidence of aneurysm or adenopathy. Reproductive: Prior hysterectomy.  No adnexal masses. Other: No free fluid or free air. Umbilical hernia containing fat, stable. Musculoskeletal: No acute bony abnormality. IMPRESSION: No acute findings in the abdomen or pelvis. Electronically Signed   By: Charlett Nose M.D.   On: 07/26/2023 19:20    Procedures Procedures    Medications Ordered in ED Medications  lactated ringers bolus 1,000 mL (0 mLs Intravenous Stopped 07/26/23 1646)  ondansetron (ZOFRAN) injection 4 mg (4 mg Intravenous Given 07/26/23 1539)  fentaNYL (SUBLIMAZE) injection 50 mcg (50 mcg Intravenous Given 07/26/23 1541)  iohexol (OMNIPAQUE) 300 MG/ML solution 100 mL (100 mLs Intravenous Contrast Given 07/26/23 1721)    ED Course/ Medical Decision Making/ A&P                                 Medical Decision Making Amount and/or Complexity of Data Reviewed Labs: ordered. Radiology: ordered.  Risk Prescription drug management.    47 year old female with medical history significant for DM2, COPD, sarcoidosis, HTN, obesity, peptic ulcer disease, GERD, anxiety, depression, previous episodes of pancreatitis who presents to the emergency department with epigastric abdominal pain, nausea and vomiting.  The patient states that she has been unable to keep any solids down over the past 2 days.  She has been able to tolerate liquids.  She endorses epigastric abdominal pain that radiates to her back and somewhat to her left upper  quadrant.  She is moving her bowels and passing gas.  Feels like prior episodes of pancreatitis.  On arrival, the patient was afebrile, vitally stable, not tachycardic or tachypneic, saturating 100% on room air. Physical exam revealed epigastric abdominal tenderness, no rebound or guarding.  Primary concern for pancreatitis, less likely small bowel  obstruction, considered choledocholithiasis, cholecystitis, peptic ulcer disease.  IV access was obtained and the patient was administered IV fentanyl, IV Zofran and IV fluid bolus.  Screening labs were obtained in addition to CT abdomen pelvis was ordered.  Labs: Lipase mildly elevated at 65, urine pregnancy negative, urinalysis without hematuria or evidence of UTI, COVID-19 influenza PCR testing negative, CBC without a leukocytosis or anemia.  CMP unremarkable.  CT abdomen pelvis: IMPRESSION:  No acute findings in the abdomen or pelvis.    Suspect patient symptoms are likely due to early pancreatitis although does not meet full diagnostic criteria for the same.  Additionally considered gastritis or peptic ulcer disease.  On repeat assessment, the patient was well-appearing, tolerating oral intake, symptomatically improved.  I recommended that the patient slowly advance her diet over the next few days, continue to push fluids, Zofran provided for nausea.  On review, the patient has opiates previously prescribed for pain control at home.  Return precautions provided in the event of any severe worsening symptoms, overall stable for discharge and outpatient management.   Final Clinical Impression(s) / ED Diagnoses Final diagnoses:  Epigastric pain    Rx / DC Orders ED Discharge Orders          Ordered    ondansetron (ZOFRAN-ODT) 4 MG disintegrating tablet  Every 8 hours PRN        07/26/23 1933    famotidine (PEPCID) 20 MG tablet  Daily        07/26/23 1940              Ernie Avena, MD 07/26/23 2148

## 2023-07-26 NOTE — Discharge Instructions (Addendum)
Your CT scan was overall reassuring.  Your labs revealed a slightly elevated pancreatic enzyme but not enough to meet the diagnostic criteria for acute pancreatitis.  Suspect either early developing acute pancreatitis or bad gastritis.  Recommend bowel rest, slowly advancing your diet from liquid clears to soft foods over the next few days.  Zofran has been prescribed for nausea.  Take your home opiates that appear previously prescribed for pain control.  I have also prescribed Pepcid in the event that you are having an episode of gastritis.  Return for any severe worsening symptoms, at this time there is no indication for acute hospitalization or inpatient admission as you are tolerating oral intake and feeling symptomatically improved.  Follow-up with your PCP to ensure resolution.

## 2023-07-26 NOTE — Telephone Encounter (Signed)
Patient scheduled a virtual appointment for tomorrow because she has been throwing up. She said she was unable to come in person tomorrow. There were no openings today and she declined to go to a different [place] office.  Patient said she has a history of pancreatitis and wanted to know if she can have blood work done today to rule that out. Patient would like a call back at [phone].

## 2023-07-27 ENCOUNTER — Telehealth: Payer: Medicaid Other | Admitting: Internal Medicine

## 2023-08-01 ENCOUNTER — Telehealth: Payer: Self-pay | Admitting: Internal Medicine

## 2023-08-01 ENCOUNTER — Encounter: Payer: Self-pay | Admitting: Internal Medicine

## 2023-08-01 ENCOUNTER — Ambulatory Visit: Payer: Medicaid Other | Admitting: Internal Medicine

## 2023-08-01 VITALS — BP 126/78 | HR 77 | Temp 98.6°F | Ht 67.0 in | Wt 278.0 lb

## 2023-08-01 DIAGNOSIS — F410 Panic disorder [episodic paroxysmal anxiety] without agoraphobia: Secondary | ICD-10-CM | POA: Diagnosis not present

## 2023-08-01 DIAGNOSIS — Z6841 Body Mass Index (BMI) 40.0 and over, adult: Secondary | ICD-10-CM | POA: Diagnosis not present

## 2023-08-01 DIAGNOSIS — E119 Type 2 diabetes mellitus without complications: Secondary | ICD-10-CM | POA: Diagnosis not present

## 2023-08-01 DIAGNOSIS — G8929 Other chronic pain: Secondary | ICD-10-CM | POA: Diagnosis not present

## 2023-08-01 DIAGNOSIS — B370 Candidal stomatitis: Secondary | ICD-10-CM | POA: Diagnosis not present

## 2023-08-01 DIAGNOSIS — M5440 Lumbago with sciatica, unspecified side: Secondary | ICD-10-CM | POA: Diagnosis not present

## 2023-08-01 DIAGNOSIS — K859 Acute pancreatitis without necrosis or infection, unspecified: Secondary | ICD-10-CM | POA: Insufficient documentation

## 2023-08-01 DIAGNOSIS — M545 Low back pain, unspecified: Secondary | ICD-10-CM | POA: Diagnosis not present

## 2023-08-01 DIAGNOSIS — M7061 Trochanteric bursitis, right hip: Secondary | ICD-10-CM | POA: Diagnosis not present

## 2023-08-01 DIAGNOSIS — M539 Dorsopathy, unspecified: Secondary | ICD-10-CM | POA: Diagnosis not present

## 2023-08-01 DIAGNOSIS — E559 Vitamin D deficiency, unspecified: Secondary | ICD-10-CM

## 2023-08-01 DIAGNOSIS — F411 Generalized anxiety disorder: Secondary | ICD-10-CM | POA: Diagnosis not present

## 2023-08-01 DIAGNOSIS — Z79899 Other long term (current) drug therapy: Secondary | ICD-10-CM | POA: Diagnosis not present

## 2023-08-01 MED ORDER — BACLOFEN 10 MG PO TABS: 10.0000 mg | ORAL_TABLET | Freq: Three times a day (TID) | ORAL | 1 refills | Status: DC

## 2023-08-01 MED ORDER — NYSTATIN 100000 UNIT/ML MT SUSP: OROMUCOSAL | 1 refills | Status: DC

## 2023-08-01 MED ORDER — KETOROLAC TROMETHAMINE 30 MG/ML IJ SOLN
30.0000 mg | Freq: Once | INTRAMUSCULAR | Status: AC
Start: 2023-08-01 — End: 2023-08-01
  Administered 2023-08-01: 30 mg via INTRAMUSCULAR

## 2023-08-01 NOTE — Assessment & Plan Note (Addendum)
Overall stable, for add baclofen 10 tid prn back pain, and toradol 30 mg IM now

## 2023-08-01 NOTE — Assessment & Plan Note (Signed)
Mild to mod, for nystatin soln asd,  to f/u any worsening symptoms or concerns 

## 2023-08-01 NOTE — Telephone Encounter (Signed)
Ok this is done 

## 2023-08-01 NOTE — Telephone Encounter (Signed)
Pt was just send today and Dr. Jonny Ruiz gave her a shot for pain and pt stated she is sick and throwing up and she was unable to return back to work today and she is asking can Dr. Jonny Ruiz extend her work note to tomorrow and put it in her my chart. Please advise.

## 2023-08-01 NOTE — Assessment & Plan Note (Signed)
Last vitamin D Lab Results  Component Value Date   VD25OH 14.37 (L) 12/22/2022   Low, to start oral replacement  

## 2023-08-01 NOTE — Assessment & Plan Note (Signed)
Improved post ED visit, now for GI referral as per pt request

## 2023-08-01 NOTE — Progress Notes (Signed)
Patient ID: Nicole Melendez, female   DOB: [birth year], 47 y.o.   MRN: [id number]        Chief Complaint: follow up recurrent pancreatitis, thrush , chronic lbp, htn, low vit d       HPI:  Nicole Melendez is a 47 y.o. female here with f/u recent pancreatitis at the 5th episode per pt, asks for new GI referral, Also with recent worsening tongue thrush, Pt continues to have recurring LBP without change in severity, bowel or bladder change, fever, wt loss,  worsening LE pain/numbness/weakness, gait change or falls.  Pt denies chest pain, increased sob or doe, wheezing, orthopnea, PND, increased LE swelling, palpitations, dizziness or syncope.   Pt denies polydipsia, polyuria, or new focal neuro s/s.           Wt Readings from Last 3 Encounters:  08/01/23 278 lb (126.1 kg)  07/20/23 282 lb 3.2 oz (128 kg)  07/06/23 281 lb (127.5 kg)   BP Readings from Last 3 Encounters:  08/01/23 126/78  07/26/23 111/79  07/20/23 118/76         Past Medical History:  Diagnosis Date   Allergic rhinitis, cause unspecified    Anemia    Anxiety    Arthritis    COPD (chronic obstructive pulmonary disease) (HCC)    Depression    Diabetes mellitus type II    steroid related, patient states "im no longer diabetic"   Essential hypertension 08/08/2015   GERD (gastroesophageal reflux disease)    History of blood transfusion    Hyperlipidemia    Menorrhagia    Migraine    Morbid obesity (HCC)    Otitis media 06/28/2015   Peptic ulcer disease    Pneumonia    Positive ANA (antinuclear antibody) 02/14/2012   Sarcoid    including hand per rheumatology-Dr. 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(HCC)    Shortness of breath    on exertion   Varicose veins with pain    Past Surgical History:  Procedure Laterality Date   ABDOMINAL HYSTERECTOMY     COLONOSCOPY WITH PROPOFOL N/A 10/19/2016   Procedure: COLONOSCOPY WITH PROPOFOL;  Surgeon: Napoleon Form, MD;  Location: [facility];  Service: Endoscopy;  Laterality:  N/A;   ESOPHAGOGASTRODUODENOSCOPY (EGD) WITH PROPOFOL N/A 10/19/2016   Procedure: ESOPHAGOGASTRODUODENOSCOPY (EGD) WITH PROPOFOL;  Surgeon: Napoleon Form, MD;  Location: [facility];  Service: Endoscopy;  Laterality: N/A;   HERNIA MESH REMOVAL  02/2013   HERNIA REPAIR     KNEE ARTHROSCOPY Left 01/15/2022   Procedure: ARTHROSCOPY KNEE;  Surgeon: Beverely Low, MD;  Location: [facility];  Service: Orthopedics;  Laterality: Left;   uterine ablation  03/2010   WISDOM TOOTH EXTRACTION      reports that she quit smoking about 10 years ago. Her smoking use included cigarettes. She started smoking about 30 years ago. She has a 20 pack-year smoking history. She has never used smokeless tobacco. She reports that she does not drink alcohol and does not use drugs. family history includes Allergies in her mother; Bone cancer in her maternal aunt; Breast cancer in her maternal aunt; Diabetes in her father and mother; Heart attack in her mother; Heart disease in her father; Hypertension in an other family member; Lung cancer in her maternal aunt; Ovarian cancer in her maternal aunt; Rheum arthritis in her father; Stroke in her father and maternal uncle. Allergies  Allergen Reactions   Azithromycin Hives    (z pak) hives   Bee  Venom Anaphylaxis   Flagyl [Metronidazole] Hives and Shortness Of Breath   Penicillins Shortness Of Breath and Swelling    Has patient had a PCN reaction causing immediate rash, facial/tongue/throat swelling, SOB or lightheadedness with hypotension: Yes Has patient had a PCN reaction causing severe rash involving mucus membranes or skin necrosis: No Has patient had a PCN reaction that required hospitalization No Has patient had a PCN reaction occurring within the last 10 years: No If all of the above answers are "NO", then may proceed with Cephalosporin use.  REACTION: swelling and difficulty breathing   Shellfish Allergy Anaphylaxis   Klonopin [Clonazepam] Other (See Comments)     Memory difficulty   Cephalexin Hives and Swelling   Cephalosporins Hives   Current Outpatient Medications on File Prior to Visit  Medication Sig Dispense Refill   acetaminophen (TYLENOL) 650 MG CR tablet Take 650-1,300 mg by mouth every 8 (eight) hours as needed for pain.     albuterol (VENTOLIN HFA) 108 (90 Base) MCG/ACT inhaler INHALE 2 PUFFS INTO THE LUNGS EVERY 4 HOURS AS NEEDED FOR WHEEZING OR SHORTNESS OF BREATH 8.5 g 0   ALPRAZolam (XANAX) 0.5 MG tablet TAKE 1 TABLET(0.5 MG) BY MOUTH TWICE DAILY AS NEEDED FOR ANXIETY 60 tablet 1   azelastine (ASTELIN) 0.1 % nasal spray Place 2 sprays into both nostrils 2 (two) times daily. Use in each nostril as directed 30 mL 12   BD PEN NEEDLE NANO 2ND GEN 32G X 4 MM MISC USE AS DIRECTED THREE TIMES DAILY 300 each 3   Blood Glucose Monitoring Suppl (ONETOUCH VERIO) w/Device KIT Use as directed daily E11.9 1 kit 0   clindamycin (CLEOCIN) 150 MG capsule Take 150 mg by mouth 4 (four) times daily.     famotidine (PEPCID) 20 MG tablet Take 1 tablet (20 mg total) by mouth daily. 30 tablet 0   fluconazole (DIFLUCAN) 150 MG tablet 1 tab by mouth every 3 days as needed 2 tablet 1   fluticasone (FLONASE) 50 MCG/ACT nasal spray Place 2 sprays into both nostrils daily. 16 g 0   gabapentin (NEURONTIN) 300 MG capsule 3 tab by mouth three times daily 270 capsule 1   glucose blood (ONETOUCH VERIO) test strip USE AS DIRECTED THREE TIMES DAILY E11.9 300 strip 11   insulin aspart (NOVOLOG FLEXPEN) 100 UNIT/ML FlexPen ADMINISTER 12 UNITS UNDER THE SKIN FOUR TIMES DAILY 15 mL 2   mometasone-formoterol (DULERA) 200-5 MCG/ACT AERO Inhale 2 puffs into the lungs in the morning and at bedtime. 8.8 g 5   ondansetron (ZOFRAN) 4 MG/5ML solution Take 5 mLs (4 mg total) by mouth every 8 (eight) hours as needed for nausea or vomiting. 50 mL 0   ondansetron (ZOFRAN-ODT) 4 MG disintegrating tablet Take 1 tablet (4 mg total) by mouth every 8 (eight) hours as needed. 20 tablet 0    oxyCODONE-acetaminophen (PERCOCET) 10-325 MG tablet Take 1 tablet by mouth every 4 (four) hours as needed for pain. 30 tablet 0   pantoprazole (PROTONIX) 40 MG tablet Take 1 tablet (40 mg total) by mouth daily. 90 tablet 3   Plecanatide (TRULANCE) 3 MG TABS 1 tab by mouth once daily 90 tablet 3   potassium chloride SA (KLOR-CON M) 20 MEQ tablet Take 20 mEq by mouth daily.     Rimegepant Sulfate (NURTEC) 75 MG TBDP 1 tab by mouth once daily as needed 16 tablet 11   rizatriptan (MAXALT-MLT) 10 MG disintegrating tablet Take 1 tablet earliest onset of migraine.  May repeat in 2 hours if needed.  Maximum 2 tablets in 24 hours 9 tablet 11   rosuvastatin (CRESTOR) 40 MG tablet 1 tab by mouth once daily 90 tablet 3   Semaglutide, 1 MG/DOSE, 4 MG/3ML SOPN Inject 1 mg as directed once a week. 3 mL 11   sertraline (ZOLOFT) 100 MG tablet Take 1 tablet (100 mg total) by mouth 2 (two) times daily. 60 tablet 11   telmisartan-hydrochlorothiazide (MICARDIS HCT) 40-12.5 MG tablet Take 1 tablet by mouth daily. 30 tablet 2   TURMERIC PO Take 1 tablet by mouth daily.     vitamin B-12 (CYANOCOBALAMIN) 1000 MCG tablet Take 1,000 mcg by mouth daily.     Vitamin D, Ergocalciferol, (DRISDOL) 1.25 MG (50000 UNIT) CAPS capsule Take by mouth.     XOPENEX HFA 45 MCG/ACT inhaler INHALE 1 TO 2 PUFFS INOT THE LUNGS EVERY 8 HOURS AS NEEDED FOR WHEEZING OR SHORTNESS OF BREATH, USE 2 PUFFS 3 TIMES DAILY THEN BACK TO HOME REGIMEN 15 g 0   zonisamide (ZONEGRAN) 100 MG capsule Take 2 capsules (200 mg total) by mouth daily. 180 capsule 3   No current facility-administered medications on file prior to visit.        ROS:  All others reviewed and negative.  Objective        PE:  BP 126/78 (BP Location: Right Arm, Patient Position: Sitting, Cuff Size: Normal)   Pulse 77   Temp 98.6 F (37 C) (Oral)   Ht 5\' 7"  (1.702 m)   Wt 278 lb (126.1 kg)   LMP 07/18/2011   SpO2 98%   BMI 43.54 kg/m                 Constitutional: Pt  appears in NAD               HENT: Head: NCAT.                Right Ear: External ear normal.                 Left Ear: External ear normal.                Eyes: . Pupils are equal, round, and reactive to light. Conjunctivae and EOM are normal               Nose: without d/c or deformity               Neck: Neck supple. Gross normal ROM               Cardiovascular: Normal rate and regular rhythm.                 Pulmonary/Chest: Effort normal and breath sounds without rales or wheezing.                Abd:  Soft,mild mid and left sided tender, without guarding or rebound,, ND, + BS, no organomegaly               Neurological: Pt is alert. At baseline orientation, motor grossly intact               Skin: Skin is warm. No rashes, no other new lesions, LE edema - none               Psychiatric: Pt behavior is normal without agitation   Micro: none  Cardiac tracings I have personally interpreted today:  none  Pertinent Radiological findings (  summarize): none   Lab Results  Component Value Date   WBC 7.7 07/26/2023   HGB 14.2 07/26/2023   HCT 41.6 07/26/2023   PLT 312 07/26/2023   GLUCOSE 86 07/26/2023   CHOL 155 12/22/2022   TRIG 87.0 12/22/2022   HDL 50.60 12/22/2022   LDLDIRECT 135.0 07/05/2011   LDLCALC 87 12/22/2022   ALT 8 07/26/2023   AST 14 (L) 07/26/2023   NA 137 07/26/2023   K 4.8 07/26/2023   CL 102 07/26/2023   CREATININE 0.81 07/26/2023   BUN 11 07/26/2023   CO2 25 07/26/2023   TSH 1.69 12/22/2022   INR 1.3 11/17/2012   HGBA1C 6.2 (A) 03/22/2023   MICROALBUR <0.7 12/22/2022   Assessment/Plan:  Nicole Melendez is a 47 y.o. Black or African American [2] female with  has a past medical history of Allergic rhinitis, cause unspecified, Anemia, Anxiety, Arthritis, COPD (chronic obstructive pulmonary disease) (HCC), Depression, Diabetes mellitus type II, Essential hypertension (08/08/2015), GERD (gastroesophageal reflux disease), History of blood transfusion,  Hyperlipidemia, Menorrhagia, Migraine, Morbid obesity (HCC), Otitis media (06/28/2015), Peptic ulcer disease, Pneumonia, Positive ANA (antinuclear antibody) (02/14/2012), Sarcoid, Sarcoidosis of lung (HCC), Shortness of breath, and Varicose veins with pain.  Acute recurrent pancreatitis Improved post ED visit, now for GI referral as per pt request  Chronic pain Overall stable, for add baclofen 10 tid prn back pain, and toradol 30 mg IM now  Thrush Mild to mod, for nystatin soln asd,  to f/u any worsening symptoms or concerns  Vitamin D deficiency Last vitamin D Lab Results  Component Value Date   VD25OH 14.37 (L) 12/22/2022   Low, to start oral replacement  Followup: Return in about 6 weeks (around 09/14/2023).  [name], MD 08/01/2023 8:22 PM [facility]

## 2023-08-01 NOTE — Patient Instructions (Addendum)
You had the pain shot today (toradol 30 mg)  You are given the work note for Oct 4  Please continue all other medications as before, including the new script for baclofen 10 mg as needed, and the Nystatin soln   Please have the pharmacy call with any other refills you may need.  Please continue your efforts at being more active, low cholesterol diet, and weight control.  Please keep your appointments with your specialists as you may have planned  You will be contacted regarding the referral for: GI - for [place] if possible (or [facility] if not)  No further lab work needed today  Please make an Appointment to return in Nov 20, or sooner if needed

## 2023-08-03 ENCOUNTER — Encounter: Payer: Self-pay | Admitting: Internal Medicine

## 2023-08-03 NOTE — Telephone Encounter (Signed)
Please clarify as the novolog is much higher than 1 unit and this can be reduced on her own if she wants, but I dont know if this is really what she is asking  The Ozempic is already 1 mg weekly  So I am not sure what she is asking   thanks

## 2023-08-04 DIAGNOSIS — Z79899 Other long term (current) drug therapy: Secondary | ICD-10-CM | POA: Diagnosis not present

## 2023-08-04 MED ORDER — OZEMPIC (0.25 OR 0.5 MG/DOSE) 2 MG/3ML ~~LOC~~ SOPN: 0.5000 mg | PEN_INJECTOR | SUBCUTANEOUS | 11 refills | Status: DC

## 2023-08-09 ENCOUNTER — Ambulatory Visit: Payer: Medicaid Other | Admitting: Podiatry

## 2023-08-09 DIAGNOSIS — L600 Ingrowing nail: Secondary | ICD-10-CM | POA: Diagnosis not present

## 2023-08-09 NOTE — Progress Notes (Signed)
Subjective:  Patient ID: Nicole Melendez, female    DOB: 1976/03/18,  MRN: [id number]  Chief Complaint  Patient presents with   Ingrown Toenail    Here for permanent removal of right great toenail- medial border.  And permament removal lateral border of the fifth toenail-  right foot.     47 y.o. female presents for concern for ingrown nail to the right hallux nail medial border.  Also concern for ingrown nail to the fifth toenail lateral border.  Has pain on palpation of both spots has previously had left hallux medial border ingrown removal with good success no pain at the area at this time  Past Medical History:  Diagnosis Date   Allergic rhinitis, cause unspecified    Anemia    Anxiety    Arthritis    COPD (chronic obstructive pulmonary disease) (HCC)    Depression    Diabetes mellitus type II    steroid related, patient states "im no longer diabetic"   Essential hypertension 08/08/2015   GERD (gastroesophageal reflux disease)    History of blood transfusion    Hyperlipidemia    Menorrhagia    Migraine    Morbid obesity (HCC)    Otitis media 06/28/2015   Peptic ulcer disease    Pneumonia    Positive ANA (antinuclear antibody) 02/14/2012   Sarcoid    including hand per rheumatology-Dr. 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(HCC)    Shortness of breath    on exertion   Varicose veins with pain     Allergies  Allergen Reactions   Azithromycin Hives    (z pak) hives   Bee Venom Anaphylaxis   Flagyl [Metronidazole] Hives and Shortness Of Breath   Penicillins Shortness Of Breath and Swelling    Has patient had a PCN reaction causing immediate rash, facial/tongue/throat swelling, SOB or lightheadedness with hypotension: Yes Has patient had a PCN reaction causing severe rash involving mucus membranes or skin necrosis: No Has patient had a PCN reaction that required hospitalization No Has patient had a PCN reaction occurring within the last 10 years: No If all of the above  answers are "NO", then may proceed with Cephalosporin use.  REACTION: swelling and difficulty breathing   Shellfish Allergy Anaphylaxis   Klonopin [Clonazepam] Other (See Comments)    Memory difficulty   Cephalexin Hives and Swelling   Cephalosporins Hives    ROS: Negative except as per HPI above  Objective:  General: AAO x3, NAD  Dermatological: Incurvation is present along the medial nail border of the right great toe as well as the lateral border of the right 5th toe. There is localized edema without any erythema or increase in warmth around the nail border. There is no drainage or pus. There is no ascending cellulitis. No malodor. No open lesions or pre-ulcerative lesions.    Vascular:  Dorsalis Pedis artery and Posterior Tibial artery pedal pulses are 2/4 bilateral.  Capillary fill time < 3 sec to all digits.   Neruologic: Grossly intact via light touch bilateral. Protective threshold intact to all sites bilateral.   Musculoskeletal: No gross boney pedal deformities bilateral. No pain, crepitus, or limitation noted with foot and ankle range of motion bilateral. Muscular strength 5/5 in all groups tested bilateral.  Gait: Unassisted, Nonantalgic.   No images are attached to the encounter.   Assessment:   1. Ingrowing right great toenail   2. Ingrown nail of fifth toe of right foot      Plan:  Patient was evaluated and treated and all questions answered.  # Ingrown nail right hallux and right fifth toe    Ingrown Nail, right -Patient elects to proceed with minor surgery to remove ingrown toenail today. Consent reviewed and signed by patient. -Ingrown nail excised. See procedure note. -Educated on post-procedure care including soaking. Written instructions provided and reviewed. -Patient to follow up in 2 weeks for nail check.  Procedure: Excision of Ingrown Toenail Location: Right 1st toe and 5th toe  nail borders. Anesthesia: Lidocaine 1% plain; 1.5 mL and  Marcaine 0.5% plain; 1.5 mL, digital block. Skin Prep: Betadine. Dressing: Silvadene; telfa; dry, sterile, compression dressing. Technique: Following skin prep, the toe was exsanguinated and a tourniquet was secured at the base of the toe. The affected nail border was freed, split with a nail splitter, and excised. Chemical matrixectomy was then performed with NaOH and irrigated out with vinegar The tourniquet was then removed and sterile dressing applied. Disposition: Patient tolerated procedure well. Patient to return in 2 weeks for follow-up.    Return in about 2 weeks (around 08/23/2023) for nail check.          Corinna Gab, DPM [facility] / [facility]

## 2023-08-09 NOTE — Patient Instructions (Signed)

## 2023-08-10 ENCOUNTER — Encounter: Payer: Self-pay | Admitting: Emergency Medicine

## 2023-08-10 ENCOUNTER — Ambulatory Visit: Payer: Medicaid Other | Admitting: Emergency Medicine

## 2023-08-10 VITALS — BP 111/78 | HR 83 | Temp 97.2°F | Ht 67.0 in | Wt 276.0 lb

## 2023-08-10 DIAGNOSIS — D860 Sarcoidosis of lung: Secondary | ICD-10-CM | POA: Diagnosis not present

## 2023-08-10 DIAGNOSIS — J301 Allergic rhinitis due to pollen: Secondary | ICD-10-CM | POA: Diagnosis not present

## 2023-08-10 DIAGNOSIS — J449 Chronic obstructive pulmonary disease, unspecified: Secondary | ICD-10-CM | POA: Diagnosis not present

## 2023-08-10 NOTE — Assessment & Plan Note (Signed)
Stable clinically.  CT chest is stable as well.  She needs repeat PFT to compare with priors and we will arrange for these.

## 2023-08-10 NOTE — Assessment & Plan Note (Signed)
Tolerating the [facility], does have some tongue irritation uses nystatin swish and swallow in case there is a component of thrush.  Overall the benefits of staying on the medication seem to outweigh the complications.  She has better exertional tolerance, better breathing.  Rare Xopenex use.  Her flu shot is up-to-date and she is plan to get the COVID-19 vaccine.

## 2023-08-10 NOTE — Assessment & Plan Note (Signed)
Continue same regimen

## 2023-08-10 NOTE — Progress Notes (Signed)
Subjective:    Patient ID: Nicole Melendez, female    DOB: 11/25/1975, 47 y.o.   MRN: [id number]  HPI   ROV 08/10/2023 --47 year old woman with a history of sarcoidosis and former tobacco use, associated COPD/asthma.  Also with allergic rhinitis.  She is not currently on methotrexate or any anti-inflammatory regimen.  I saw her in July at which time she was off [facility].  We restarted this for her.  Also restarted her fluticasone nasal spray, continue her loratadine for her allergic rhinitis.  We arranged for her PFT, CT chest and also a sleep study given some suspicion for possible OSA.  The PFT have not yet been done Today she reports that she started the dulera, can irritate her tongue - she uses Nystatin SS. Her breathing is better. She has been able to swim some. Rare xopenex use, about weekly. Flu shot up to date. She is planning to get covid booster.   CT chest 06/03/2023 reviewed by me, shows no mediastinal or hilar adenopathy, bilateral slightly rounded heterogeneous groundglass and some interstitial thickening unchanged compared with 2022  Split-night sleep study showed a total AHI of 2.8/h and some mild deoxygenation while snoring.  Her AHI while supine was 3.2/h, during REM sleep was 13.9/h.  No CPAP was recommended   Review of Systems  As per HPI     Objective:   Physical Exam  Vitals:   08/10/23 1003  BP: 111/78  Pulse: 83  Temp: (!) 97.2 F (36.2 C)  TempSrc: Temporal  SpO2: 97%  Weight: 276 lb (125.2 kg)  Height: 5\' 7"  (1.702 m)   Gen: Pleasant, obese woman, in no distress,  normal affect  ENT: No lesions,  mouth clear,  oropharynx clear, no postnasal drip  Neck: No JVD, no stridor  Lungs: No use of accessory muscles, clear without rales or rhonchi, soft end expiratory wheeze on forced expiration Cardiovascular: RRR, heart sounds normal, no murmur or gallops, no peripheral edema  Musculoskeletal: No deformities, no cyanosis or clubbing  Neuro: alert, non  focal  Skin: Warm, no lesions or rashes        Assessment & Plan:  Sarcoidosis of lung (HCC) Stable clinically.  CT chest is stable as well.  She needs repeat PFT to compare with priors and we will arrange for these.  Allergic rhinitis Continue same regimen  COPD (chronic obstructive pulmonary disease) (HCC) Tolerating the Dulera, does have some tongue irritation uses nystatin swish and swallow in case there is a component of thrush.  Overall the benefits of staying on the medication seem to outweigh the complications.  She has better exertional tolerance, better breathing.  Rare Xopenex use.  Her flu shot is up-to-date and she is plan to get the COVID-19 vaccine.   Levy Pupa, MD, PhD 08/10/2023, 10:40 AM [facility] [phone] or if no answer [phone]

## 2023-08-10 NOTE — Patient Instructions (Addendum)
We reviewed CT scan of the chest today.  It is stable going back to 2022.  Good news.  We will discuss the timing of any repeat scans at your future visits, probably in about 2 years We will go ahead and schedule your pulmonary function testing to compare with your priors. Please continue Dulera 2 puffs twice a day.  Rinse and gargle after using. Keep your Xopenex available to use 2 puffs to be needed for shortness of breath, chest tightness, wheezing. We reviewed your sleep study.  There is no evidence for significant sleep apnea.  We will not pursue CPAP at this time. Continue your allergy medications as you have been taking them Flu shot up-to-date Get your COVID-19 vaccine as planned Follow with Dr Delton Coombes in 3 months or sooner if you have any problems.

## 2023-08-17 ENCOUNTER — Ambulatory Visit: Payer: Medicaid Other | Admitting: Internal Medicine

## 2023-08-17 ENCOUNTER — Encounter: Payer: Self-pay | Admitting: Internal Medicine

## 2023-08-17 VITALS — BP 122/76 | HR 80 | Temp 98.6°F | Ht 67.0 in | Wt 276.0 lb

## 2023-08-17 DIAGNOSIS — R1013 Epigastric pain: Secondary | ICD-10-CM | POA: Insufficient documentation

## 2023-08-17 DIAGNOSIS — J301 Allergic rhinitis due to pollen: Secondary | ICD-10-CM | POA: Diagnosis not present

## 2023-08-17 DIAGNOSIS — J069 Acute upper respiratory infection, unspecified: Secondary | ICD-10-CM

## 2023-08-17 DIAGNOSIS — B370 Candidal stomatitis: Secondary | ICD-10-CM

## 2023-08-17 MED ORDER — TRIAMCINOLONE ACETONIDE 55 MCG/ACT NA AERO
2.0000 | INHALATION_SPRAY | Freq: Every day | NASAL | 12 refills | Status: DC
  Filled 2024-05-30: qty 16.9, 30d supply, fill #0

## 2023-08-17 MED ORDER — PANTOPRAZOLE SODIUM 40 MG PO TBEC: 40.0000 mg | DELAYED_RELEASE_TABLET | Freq: Two times a day (BID) | ORAL | 5 refills | Status: DC

## 2023-08-17 MED ORDER — SUCRALFATE 1 G PO TABS: 1.0000 g | ORAL_TABLET | Freq: Three times a day (TID) | ORAL | 0 refills | Status: DC

## 2023-08-17 MED ORDER — FLUCONAZOLE 100 MG PO TABS: 100.0000 mg | ORAL_TABLET | Freq: Every day | ORAL | 0 refills | Status: DC

## 2023-08-17 NOTE — Assessment & Plan Note (Signed)
C/w viral infection it seems, for work note, conservative tx,  to f/u any worsening symptoms or concerns

## 2023-08-17 NOTE — Assessment & Plan Note (Signed)
Moderate, to continue zofran, but also increase protonix 40 bid, add carafate 1 qid x 1 mo, and f/u GI as able

## 2023-08-17 NOTE — Patient Instructions (Addendum)
Ok to increase the protonix 40 mg twice per day  Ok for carafate as prescribed for 1 month  Ok to change the flonase to [employer] for the allergies  Please take all new medication as prescribed - the diflucan course in addition to the nystatin  Ok for the work note  Please continue all other medications as before, and refills have been done if requested.  Please have the pharmacy call with any other refills you may need.  Please keep your appointments with your specialists as you may have planned - GI hopefully soon  We can hold on lab testing today  Please make an Appointment to return in Nov 20 or sooner if needed

## 2023-08-17 NOTE — Assessment & Plan Note (Signed)
Has nosebleeds with flonase - ok to change to nasacort asd which is water based

## 2023-08-17 NOTE — Assessment & Plan Note (Signed)
Persistent despite the nystatin soln - for diflucan 100 every day x 7 days

## 2023-08-17 NOTE — Progress Notes (Signed)
Patient ID: Nicole Melendez, female   DOB: Oct 31, 1975, 47 y.o.   MRN: [id number]        Chief Complaint: follow up dyspepsia, allergic rhinitis, uri, thrush       HPI:  Nicole Melendez is a 47 y.o. female  Here with 2-3 days acute onset fever, facial pain, pressure, headache, general weakness and malaise, and clearish d/c, with mild ST and cough, but pt denies chest pain, wheezing, increased sob or doe, orthopnea, PND, increased LE swelling, palpitations, dizziness or syncope.Does have several wks ongoing nasal allergy symptoms with clearish congestion, itch and sneezing, without fever, pain, ST, cough, swelling or wheezing.  Also with persistent nausea and reflux and epigastric pain for the last 3 wks worsening overall it seems, though some different from the pancreatitis.  Did throw up all meds this am.  Has worsening thrush and discomfort on swallowing .   Has not been able yet to see GI.         Wt Readings from Last 3 Encounters:  08/17/23 276 lb (125.2 kg)  08/10/23 276 lb (125.2 kg)  08/01/23 278 lb (126.1 kg)   BP Readings from Last 3 Encounters:  08/17/23 122/76  08/10/23 111/78  08/01/23 126/78         Past Medical History:  Diagnosis Date   Allergic rhinitis, cause unspecified    Anemia    Anxiety    Arthritis    COPD (chronic obstructive pulmonary disease) (HCC)    Depression    Diabetes mellitus type II    steroid related, patient states "im no longer diabetic"   Essential hypertension 08/08/2015   GERD (gastroesophageal reflux disease)    History of blood transfusion    Hyperlipidemia    Menorrhagia    Migraine    Morbid obesity (HCC)    Otitis media 06/28/2015   Peptic ulcer disease    Pneumonia    Positive ANA (antinuclear antibody) 02/14/2012   Sarcoid    including hand per rheumatology-Dr. 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(HCC)    Shortness of breath    on exertion   Varicose veins with pain    Past Surgical History:  Procedure Laterality Date   ABDOMINAL  HYSTERECTOMY     COLONOSCOPY WITH PROPOFOL N/A 10/19/2016   Procedure: COLONOSCOPY WITH PROPOFOL;  Surgeon: Napoleon Form, MD;  Location: [facility];  Service: Endoscopy;  Laterality: N/A;   ESOPHAGOGASTRODUODENOSCOPY (EGD) WITH PROPOFOL N/A 10/19/2016   Procedure: ESOPHAGOGASTRODUODENOSCOPY (EGD) WITH PROPOFOL;  Surgeon: Napoleon Form, MD;  Location: [facility];  Service: Endoscopy;  Laterality: N/A;   HERNIA MESH REMOVAL  02/2013   HERNIA REPAIR     KNEE ARTHROSCOPY Left 01/15/2022   Procedure: ARTHROSCOPY KNEE;  Surgeon: Beverely Low, MD;  Location: [facility];  Service: Orthopedics;  Laterality: Left;   uterine ablation  03/2010   WISDOM TOOTH EXTRACTION      reports that she quit smoking about 10 years ago. Her smoking use included cigarettes. She started smoking about 30 years ago. She has a 20 pack-year smoking history. She has never used smokeless tobacco. She reports that she does not drink alcohol and does not use drugs. family history includes Allergies in her mother; Bone cancer in her maternal aunt; Breast cancer in her maternal aunt; Diabetes in her father and mother; Heart attack in her mother; Heart disease in her father; Hypertension in an other family member; Lung cancer in her maternal aunt; Ovarian cancer in her maternal  aunt; Rheum arthritis in her father; Stroke in her father and maternal uncle. Allergies  Allergen Reactions   Azithromycin Hives    (z pak) hives   Bee Venom Anaphylaxis   Flagyl [Metronidazole] Hives and Shortness Of Breath   Penicillins Shortness Of Breath and Swelling    Has patient had a PCN reaction causing immediate rash, facial/tongue/throat swelling, SOB or lightheadedness with hypotension: Yes Has patient had a PCN reaction causing severe rash involving mucus membranes or skin necrosis: No Has patient had a PCN reaction that required hospitalization No Has patient had a PCN reaction occurring within the last 10 years: No If all of the  above answers are "NO", then may proceed with Cephalosporin use.  REACTION: swelling and difficulty breathing   Shellfish Allergy Anaphylaxis   Klonopin [Clonazepam] Other (See Comments)    Memory difficulty   Cephalexin Hives and Swelling   Cephalosporins Hives   Current Outpatient Medications on File Prior to Visit  Medication Sig Dispense Refill   acetaminophen (TYLENOL) 650 MG CR tablet Take 650-1,300 mg by mouth every 8 (eight) hours as needed for pain.     albuterol (VENTOLIN HFA) 108 (90 Base) MCG/ACT inhaler INHALE 2 PUFFS INTO THE LUNGS EVERY 4 HOURS AS NEEDED FOR WHEEZING OR SHORTNESS OF BREATH 8.5 g 0   ALPRAZolam (XANAX) 0.5 MG tablet TAKE 1 TABLET(0.5 MG) BY MOUTH TWICE DAILY AS NEEDED FOR ANXIETY 60 tablet 1   azelastine (ASTELIN) 0.1 % nasal spray Place 2 sprays into both nostrils 2 (two) times daily. Use in each nostril as directed 30 mL 12   baclofen (LIORESAL) 10 MG tablet Take 1 tablet (10 mg total) by mouth 3 (three) times daily. 60 each 1   BD PEN NEEDLE NANO 2ND GEN 32G X 4 MM MISC USE AS DIRECTED THREE TIMES DAILY 300 each 3   Blood Glucose Monitoring Suppl (ONETOUCH VERIO) w/Device KIT Use as directed daily E11.9 1 kit 0   clindamycin (CLEOCIN) 150 MG capsule Take 150 mg by mouth 4 (four) times daily.     famotidine (PEPCID) 20 MG tablet Take 1 tablet (20 mg total) by mouth daily. 30 tablet 0   fluconazole (DIFLUCAN) 150 MG tablet 1 tab by mouth every 3 days as needed 2 tablet 1   fluticasone (FLONASE) 50 MCG/ACT nasal spray Place 2 sprays into both nostrils daily. 16 g 0   gabapentin (NEURONTIN) 300 MG capsule 3 tab by mouth three times daily 270 capsule 1   glucose blood (ONETOUCH VERIO) test strip USE AS DIRECTED THREE TIMES DAILY E11.9 300 strip 11   insulin aspart (NOVOLOG FLEXPEN) 100 UNIT/ML FlexPen ADMINISTER 12 UNITS UNDER THE SKIN FOUR TIMES DAILY 15 mL 2   mometasone-formoterol (DULERA) 200-5 MCG/ACT AERO Inhale 2 puffs into the lungs in the morning and at  bedtime. 8.8 g 5   nystatin (MYCOSTATIN) 100000 UNIT/ML suspension 20cc three times a day for 3 days. Swish and Swallow 180 mL 1   ondansetron (ZOFRAN) 4 MG/5ML solution Take 5 mLs (4 mg total) by mouth every 8 (eight) hours as needed for nausea or vomiting. 50 mL 0   ondansetron (ZOFRAN-ODT) 4 MG disintegrating tablet Take 1 tablet (4 mg total) by mouth every 8 (eight) hours as needed. 20 tablet 0   oxyCODONE-acetaminophen (PERCOCET) 10-325 MG tablet Take 1 tablet by mouth every 4 (four) hours as needed for pain. 30 tablet 0   Plecanatide (TRULANCE) 3 MG TABS 1 tab by mouth once daily 90 tablet  3   potassium chloride SA (KLOR-CON M) 20 MEQ tablet Take 20 mEq by mouth daily.     Rimegepant Sulfate (NURTEC) 75 MG TBDP 1 tab by mouth once daily as needed 16 tablet 11   rizatriptan (MAXALT-MLT) 10 MG disintegrating tablet Take 1 tablet earliest onset of migraine.  May repeat in 2 hours if needed.  Maximum 2 tablets in 24 hours 9 tablet 11   rosuvastatin (CRESTOR) 40 MG tablet 1 tab by mouth once daily 90 tablet 3   Semaglutide,0.25 or 0.5MG /DOS, (OZEMPIC, 0.25 OR 0.5 MG/DOSE,) 2 MG/3ML SOPN Inject 0.5 mg into the skin once a week. 3 mL 11   sertraline (ZOLOFT) 100 MG tablet Take 1 tablet (100 mg total) by mouth 2 (two) times daily. 60 tablet 11   telmisartan-hydrochlorothiazide (MICARDIS HCT) 40-12.5 MG tablet Take 1 tablet by mouth daily. 30 tablet 2   TURMERIC PO Take 1 tablet by mouth daily.     vitamin B-12 (CYANOCOBALAMIN) 1000 MCG tablet Take 1,000 mcg by mouth daily.     Vitamin D, Ergocalciferol, (DRISDOL) 1.25 MG (50000 UNIT) CAPS capsule Take by mouth.     XOPENEX HFA 45 MCG/ACT inhaler INHALE 1 TO 2 PUFFS INOT THE LUNGS EVERY 8 HOURS AS NEEDED FOR WHEEZING OR SHORTNESS OF BREATH, USE 2 PUFFS 3 TIMES DAILY THEN BACK TO HOME REGIMEN 15 g 0   zonisamide (ZONEGRAN) 100 MG capsule Take 2 capsules (200 mg total) by mouth daily. 180 capsule 3   No current facility-administered medications on file  prior to visit.        ROS:  All others reviewed and negative.  Objective        PE:  BP 122/76 (BP Location: Right Arm, Patient Position: Sitting, Cuff Size: Normal)   Pulse 80   Temp 98.6 F (37 C) (Oral)   Ht 5\' 7"  (1.702 m)   Wt 276 lb (125.2 kg)   LMP 07/18/2011   SpO2 99%   BMI 43.23 kg/m                 Constitutional: Pt appears in NAD               HENT: Head: NCAT.                Right Ear: External ear normal.                 Left Ear: External ear normal. Bilat tm's with mild erythema.  Max sinus areas non tender.  Pharynx with mild erythema, no exudate               Eyes: . Pupils are equal, round, and reactive to light. Conjunctivae and EOM are normal               Nose: without d/c or deformity; mouth with thrush to tongue white               Neck: Neck supple. Gross normal ROM               Cardiovascular: Normal rate and regular rhythm.                 Pulmonary/Chest: Effort normal and breath sounds without rales or wheezing.                Abd:  Soft,tender epigastric,, ND, + BS, no organomegaly               Neurological: Pt  is alert. At baseline orientation, motor grossly intact               Skin: Skin is warm. No rashes, no other new lesions, LE edema - none               Psychiatric: Pt behavior is normal without agitation   Micro: none  Cardiac tracings I have personally interpreted today:  none  Pertinent Radiological findings (summarize): none   Lab Results  Component Value Date   WBC 7.7 07/26/2023   HGB 14.2 07/26/2023   HCT 41.6 07/26/2023   PLT 312 07/26/2023   GLUCOSE 86 07/26/2023   CHOL 155 12/22/2022   TRIG 87.0 12/22/2022   HDL 50.60 12/22/2022   LDLDIRECT 135.0 07/05/2011   LDLCALC 87 12/22/2022   ALT 8 07/26/2023   AST 14 (L) 07/26/2023   NA 137 07/26/2023   K 4.8 07/26/2023   CL 102 07/26/2023   CREATININE 0.81 07/26/2023   BUN 11 07/26/2023   CO2 25 07/26/2023   TSH 1.69 12/22/2022   INR 1.3 11/17/2012   HGBA1C 6.2 (A)  03/22/2023   MICROALBUR <0.7 12/22/2022   Assessment/Plan:  LORETA ROSI is a 47 y.o. Black or African American [2] female with  has a past medical history of Allergic rhinitis, cause unspecified, Anemia, Anxiety, Arthritis, COPD (chronic obstructive pulmonary disease) (HCC), Depression, Diabetes mellitus type II, Essential hypertension (08/08/2015), GERD (gastroesophageal reflux disease), History of blood transfusion, Hyperlipidemia, Menorrhagia, Migraine, Morbid obesity (HCC), Otitis media (06/28/2015), Peptic ulcer disease, Pneumonia, Positive ANA (antinuclear antibody) (02/14/2012), Sarcoid, Sarcoidosis of lung (HCC), Shortness of breath, and Varicose veins with pain.  Thrush Persistent despite the nystatin soln - for diflucan 100 every day x 7 days  Dyspepsia Moderate, to continue zofran, but also increase protonix 40 bid, add carafate 1 qid x 1 mo, and f/u GI as able  Allergic rhinitis Has nosebleeds with flonase - ok to change to nasacort asd which is water based  Acute upper respiratory infection C/w viral infection it seems, for work note, conservative tx,  to f/u any worsening symptoms or concerns  Followup: Return in about 4 weeks (around 09/14/2023).  [name], MD 08/17/2023 10:21 AM [facility]

## 2023-08-22 ENCOUNTER — Encounter: Payer: Self-pay | Admitting: Internal Medicine

## 2023-08-22 ENCOUNTER — Telehealth: Payer: Self-pay | Admitting: Internal Medicine

## 2023-08-22 MED ORDER — DOXYCYCLINE HYCLATE 100 MG PO TABS: 100.0000 mg | ORAL_TABLET | Freq: Two times a day (BID) | ORAL | 0 refills | Status: DC

## 2023-08-22 NOTE — Telephone Encounter (Signed)
Ok to extend for 1 week please - to staff to assist, thanks

## 2023-08-22 NOTE — Telephone Encounter (Signed)
Letter sent Via My-Chart

## 2023-08-22 NOTE — Telephone Encounter (Signed)
Pt called wanting her Doctors note time extended because she is still dealing with her throat and she have to work today and she talks all day. Pleas advise.

## 2023-08-24 ENCOUNTER — Telehealth: Payer: Self-pay | Admitting: Emergency Medicine

## 2023-08-24 NOTE — Telephone Encounter (Signed)
PT sent [facility] message about thrush and sore throat. Her PCP hgave her an Antibx and said thrush had gone to her throat. This is just an FYI. NFN. PT will call if issues continues.

## 2023-08-27 ENCOUNTER — Other Ambulatory Visit: Payer: Self-pay | Admitting: Internal Medicine

## 2023-08-29 ENCOUNTER — Ambulatory Visit: Payer: Medicaid Other | Admitting: Podiatry

## 2023-08-29 ENCOUNTER — Encounter: Payer: Self-pay | Admitting: Internal Medicine

## 2023-08-29 DIAGNOSIS — L600 Ingrowing nail: Secondary | ICD-10-CM

## 2023-08-29 NOTE — Progress Notes (Signed)
Subjective: Nicole Melendez is a 47 y.o.  female returns to office today for follow up evaluation after having right Hallux medial border and right 5th toe lateral nail ingrown removal with phenol and alcohol matrixectomy approximately 2 weeks ago. Patient has been soaking using epsom salts and applying topical antibiotic covered with bandaid daily. Patient denies fevers, chills, nausea, vomiting. Denies any calf pain, chest pain, SOB.   Objective:  Vitals: Reviewed  General: Well developed, nourished, in no acute distress, alert and oriented x3   Dermatology: Skin is warm, dry and supple bilateral. right hallux and right 5th nail border appears to be clean, dry, with mild granular tissue and surrounding scab. There is no surrounding erythema, edema, drainage/purulence. The remaining nails appear unremarkable at this time. There are no other lesions or other signs of infection present.  Neurovascular status: Intact. No lower extremity swelling; No pain with calf compression bilateral.  Musculoskeletal: Decreased tenderness to palpation of the right hallux and right 5th nail fold(s). Muscular strength within normal limits bilateral.   Assesement and Plan: S/p phenol and alcohol matrixectomy to the  right hallux medial  and right 5th nail lateral, doing well.   -Continue soaking in epsom salts twice a day followed by antibiotic ointment and a band-aid. Can leave uncovered at night. Continue this until completely healed.  -If the area has not healed in 2 weeks, call the office for follow-up appointment, or sooner if any problems arise.  -Monitor for any signs/symptoms of infection. Call the office immediately if any occur or go directly to the emergency room. Call with any questions/concerns.        Corinna Gab, DPM [facility] / [facility]                   08/29/2023

## 2023-08-29 NOTE — Telephone Encounter (Signed)
I am unable to erx this medication as the EPIC states the demographic information is too much to send erx  I think there are too many characters in the address and needs to be edited.  Please address with Ms Nicole Melendez if necessary to amend the demographic data so we are able to send this erx

## 2023-08-30 DIAGNOSIS — M539 Dorsopathy, unspecified: Secondary | ICD-10-CM | POA: Diagnosis not present

## 2023-08-30 DIAGNOSIS — F410 Panic disorder [episodic paroxysmal anxiety] without agoraphobia: Secondary | ICD-10-CM | POA: Diagnosis not present

## 2023-08-30 DIAGNOSIS — M5440 Lumbago with sciatica, unspecified side: Secondary | ICD-10-CM | POA: Diagnosis not present

## 2023-08-30 DIAGNOSIS — M7061 Trochanteric bursitis, right hip: Secondary | ICD-10-CM | POA: Diagnosis not present

## 2023-08-30 DIAGNOSIS — Z6841 Body Mass Index (BMI) 40.0 and over, adult: Secondary | ICD-10-CM | POA: Diagnosis not present

## 2023-08-30 DIAGNOSIS — Z79899 Other long term (current) drug therapy: Secondary | ICD-10-CM | POA: Diagnosis not present

## 2023-08-30 DIAGNOSIS — E119 Type 2 diabetes mellitus without complications: Secondary | ICD-10-CM | POA: Diagnosis not present

## 2023-08-30 DIAGNOSIS — F411 Generalized anxiety disorder: Secondary | ICD-10-CM | POA: Diagnosis not present

## 2023-08-30 MED ORDER — ALPRAZOLAM 0.5 MG PO TABS: 0.5000 mg | ORAL_TABLET | Freq: Two times a day (BID) | ORAL | 2 refills | Status: DC | PRN

## 2023-08-30 NOTE — Addendum Note (Signed)
Addended by: [name] on: 08/30/2023 04:55 PM   Modules accepted: Orders

## 2023-08-31 ENCOUNTER — Telehealth: Payer: Medicaid Other | Admitting: Internal Medicine

## 2023-09-03 ENCOUNTER — Other Ambulatory Visit: Payer: Self-pay | Admitting: Emergency Medicine

## 2023-09-14 ENCOUNTER — Ambulatory Visit (INDEPENDENT_AMBULATORY_CARE_PROVIDER_SITE_OTHER): Payer: Medicaid Other | Admitting: Internal Medicine

## 2023-09-14 ENCOUNTER — Encounter: Payer: Self-pay | Admitting: Internal Medicine

## 2023-09-14 VITALS — BP 124/82 | HR 80 | Temp 99.2°F | Ht 67.0 in | Wt 272.0 lb

## 2023-09-14 DIAGNOSIS — E1140 Type 2 diabetes mellitus with diabetic neuropathy, unspecified: Secondary | ICD-10-CM

## 2023-09-14 DIAGNOSIS — E7849 Other hyperlipidemia: Secondary | ICD-10-CM

## 2023-09-14 DIAGNOSIS — H6641 Suppurative otitis media, unspecified, right ear: Secondary | ICD-10-CM | POA: Diagnosis not present

## 2023-09-14 DIAGNOSIS — E559 Vitamin D deficiency, unspecified: Secondary | ICD-10-CM | POA: Diagnosis not present

## 2023-09-14 DIAGNOSIS — Z794 Long term (current) use of insulin: Secondary | ICD-10-CM | POA: Diagnosis not present

## 2023-09-14 DIAGNOSIS — H538 Other visual disturbances: Secondary | ICD-10-CM

## 2023-09-14 DIAGNOSIS — I10 Essential (primary) hypertension: Secondary | ICD-10-CM

## 2023-09-14 LAB — CBC WITH DIFFERENTIAL/PLATELET
Basophils Absolute: 0 10*3/uL (ref 0.0–0.1)
Basophils Relative: 0.8 % (ref 0.0–3.0)
Eosinophils Absolute: 0.2 10*3/uL (ref 0.0–0.7)
Eosinophils Relative: 3.4 % (ref 0.0–5.0)
HCT: 39.5 % (ref 36.0–46.0)
Hemoglobin: 13.2 g/dL (ref 12.0–15.0)
Lymphocytes Relative: 32.2 % (ref 12.0–46.0)
Lymphs Abs: 2.1 10*3/uL (ref 0.7–4.0)
MCHC: 33.3 g/dL (ref 30.0–36.0)
MCV: 89.2 fL (ref 78.0–100.0)
Monocytes Absolute: 0.3 10*3/uL (ref 0.1–1.0)
Monocytes Relative: 5.1 % (ref 3.0–12.0)
Neutro Abs: 3.9 10*3/uL (ref 1.4–7.7)
Neutrophils Relative %: 58.5 % (ref 43.0–77.0)
Platelets: 274 10*3/uL (ref 150.0–400.0)
RBC: 4.43 Mil/uL (ref 3.87–5.11)
RDW: 14.2 % (ref 11.5–15.5)
WBC: 6.6 10*3/uL (ref 4.0–10.5)

## 2023-09-14 LAB — LIPID PANEL
Cholesterol: 143 mg/dL (ref 0–200)
HDL: 43.1 mg/dL (ref >39.00)
LDL Cholesterol: 81 mg/dL (ref 0–99)
NonHDL: 100.36
Total CHOL/HDL Ratio: 3
Triglycerides: 99 mg/dL (ref 0.0–149.0)
VLDL: 19.8 mg/dL (ref 0.0–40.0)

## 2023-09-14 LAB — VITAMIN D 25 HYDROXY (VIT D DEFICIENCY, FRACTURES): VITD: 19.94 ng/mL — ABNORMAL LOW (ref 30.00–100.00)

## 2023-09-14 LAB — URINALYSIS, ROUTINE W REFLEX MICROSCOPIC
Bilirubin Urine: NEGATIVE
Ketones, ur: NEGATIVE
Leukocytes,Ua: NEGATIVE
Nitrite: NEGATIVE
Specific Gravity, Urine: 1.015 (ref 1.000–1.030)
Total Protein, Urine: NEGATIVE
Urine Glucose: NEGATIVE
Urobilinogen, UA: 0.2 (ref 0.0–1.0)
pH: 7 (ref 5.0–8.0)

## 2023-09-14 LAB — MICROALBUMIN / CREATININE URINE RATIO
Creatinine,U: 71.2 mg/dL
Microalb Creat Ratio: 1 mg/g (ref 0.0–30.0)
Microalb, Ur: <0.7 mg/dL (ref 0.0–1.9)

## 2023-09-14 LAB — HEPATIC FUNCTION PANEL
ALT: 8 U/L (ref 0–35)
AST: 12 U/L (ref 0–37)
Albumin: 4.2 g/dL (ref 3.5–5.2)
Alkaline Phosphatase: 114 U/L (ref 39–117)
Bilirubin, Direct: 0 mg/dL (ref 0.0–0.3)
Total Bilirubin: 0.6 mg/dL (ref 0.2–1.2)
Total Protein: 7.3 g/dL (ref 6.0–8.3)

## 2023-09-14 LAB — BASIC METABOLIC PANEL
BUN: 7 mg/dL (ref 6–23)
CO2: 26 meq/L (ref 19–32)
Calcium: 9.6 mg/dL (ref 8.4–10.5)
Chloride: 105 meq/L (ref 96–112)
Creatinine, Ser: 0.75 mg/dL (ref 0.40–1.20)
GFR: 93.31 mL/min (ref >60.00)
Glucose, Bld: 84 mg/dL (ref 70–99)
Potassium: 3.3 meq/L — ABNORMAL LOW (ref 3.5–5.1)
Sodium: 139 meq/L (ref 135–145)

## 2023-09-14 LAB — TSH: TSH: 0.75 u[IU]/mL (ref 0.35–5.50)

## 2023-09-14 LAB — HEMOGLOBIN A1C: Hgb A1c MFr Bld: 5.9 % (ref 4.6–6.5)

## 2023-09-14 MED ORDER — OXYCODONE-ACETAMINOPHEN 10-325 MG PO TABS: 1.0000 | ORAL_TABLET | Freq: Four times a day (QID) | ORAL | 0 refills | Status: DC | PRN

## 2023-09-14 MED ORDER — NURTEC 75 MG PO TBDP: ORAL_TABLET | ORAL | 11 refills | Status: DC

## 2023-09-14 MED ORDER — BACLOFEN 10 MG PO TABS: 10.0000 mg | ORAL_TABLET | Freq: Three times a day (TID) | ORAL | 1 refills | Status: DC

## 2023-09-14 MED ORDER — SEMAGLUTIDE (1 MG/DOSE) 4 MG/3ML ~~LOC~~ SOPN: 1.0000 mg | PEN_INJECTOR | SUBCUTANEOUS | 11 refills | Status: DC

## 2023-09-14 MED ORDER — LEVOFLOXACIN 500 MG PO TABS: 500.0000 mg | ORAL_TABLET | Freq: Every day | ORAL | 0 refills | Status: DC

## 2023-09-14 NOTE — Patient Instructions (Addendum)
You will be contacted regarding the referral for: eye doctor  Please consider the Tdap at the pharmacy  Please take all new medication as prescribed - the antibiotic  Please continue all other medications as before, and refills have been done if requested.  Please have the pharmacy call with any other refills you may need.  Please continue your efforts at being more active, low cholesterol diet, and weight control.  Please keep your appointments with your specialists as you may have planned  Please go to the LAB at the blood drawing area for the tests to be done  You will be contacted by phone if any changes need to be made immediately.  Otherwise, you will receive a letter about your results with an explanation, but please check with MyChart first.  Please make an Appointment to return in 6 months, or sooner if needed

## 2023-09-14 NOTE — Progress Notes (Signed)
The test results show that your current treatment is OK, as the tests are stable.  Please continue the same plan.  There is no other need for change of treatment or further evaluation based on these results, at this time.  thanks 

## 2023-09-14 NOTE — Progress Notes (Signed)
Patient ID: Nicole Melendez, female   DOB: 1976/05/06, 47 y.o.   MRN: [id number]        Chief Complaint: follow up right ear pain, dm, hld       HPI:  Nicole Melendez is a 47 y.o. female here with 2-3 days onset mod right ear pain and pressure, feverish, with occasional mild ST and non prod cough.  Pt denies chest pain, increased sob or doe, wheezing, orthopnea, PND, increased LE swelling, palpitations, dizziness or syncope.   Pt denies polydipsia, polyuria, or new focal neuro s/s.    Pt denies wt loss, night sweats, loss of appetite, or other constitutional symptoms   Denies worsening reflux, abd pain, dysphagia, n/v, bowel change or blood.  Has mild worsening blurry vision, asks for optho referral  Wt Readings from Last 3 Encounters:  09/14/23 272 lb (123.4 kg)  08/17/23 276 lb (125.2 kg)  08/10/23 276 lb (125.2 kg)   BP Readings from Last 3 Encounters:  09/14/23 124/82  08/17/23 122/76  08/10/23 111/78         Past Medical History:  Diagnosis Date   Allergic rhinitis, cause unspecified    Anemia    Anxiety    Arthritis    COPD (chronic obstructive pulmonary disease) (HCC)    Depression    Diabetes mellitus type II    steroid related, patient states "im no longer diabetic"   Essential hypertension 08/08/2015   GERD (gastroesophageal reflux disease)    History of blood transfusion    Hyperlipidemia    Menorrhagia    Migraine    Morbid obesity (HCC)    Otitis media 06/28/2015   Peptic ulcer disease    Pneumonia    Positive ANA (antinuclear antibody) 02/14/2012   Sarcoid    including hand per rheumatology-Dr. 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(HCC)    Shortness of breath    on exertion   Varicose veins with pain    Past Surgical History:  Procedure Laterality Date   ABDOMINAL HYSTERECTOMY     COLONOSCOPY WITH PROPOFOL N/A 10/19/2016   Procedure: COLONOSCOPY WITH PROPOFOL;  Surgeon: Napoleon Form, MD;  Location: [facility];  Service: Endoscopy;  Laterality: N/A;    ESOPHAGOGASTRODUODENOSCOPY (EGD) WITH PROPOFOL N/A 10/19/2016   Procedure: ESOPHAGOGASTRODUODENOSCOPY (EGD) WITH PROPOFOL;  Surgeon: Napoleon Form, MD;  Location: [facility];  Service: Endoscopy;  Laterality: N/A;   HERNIA MESH REMOVAL  02/2013   HERNIA REPAIR     KNEE ARTHROSCOPY Left 01/15/2022   Procedure: ARTHROSCOPY KNEE;  Surgeon: Beverely Low, MD;  Location: [facility];  Service: Orthopedics;  Laterality: Left;   uterine ablation  03/2010   WISDOM TOOTH EXTRACTION      reports that she quit smoking about 10 years ago. Her smoking use included cigarettes. She started smoking about 30 years ago. She has a 20 pack-year smoking history. She has never used smokeless tobacco. She reports that she does not drink alcohol and does not use drugs. family history includes Allergies in her mother; Bone cancer in her maternal aunt; Breast cancer in her maternal aunt; Diabetes in her father and mother; Heart attack in her mother; Heart disease in her father; Hypertension in an other family member; Lung cancer in her maternal aunt; Ovarian cancer in her maternal aunt; Rheum arthritis in her father; Stroke in her father and maternal uncle. Allergies  Allergen Reactions   Azithromycin Hives    (z pak) hives   Bee Venom Anaphylaxis  Flagyl [Metronidazole] Hives and Shortness Of Breath   Penicillins Shortness Of Breath and Swelling    Has patient had a PCN reaction causing immediate rash, facial/tongue/throat swelling, SOB or lightheadedness with hypotension: Yes Has patient had a PCN reaction causing severe rash involving mucus membranes or skin necrosis: No Has patient had a PCN reaction that required hospitalization No Has patient had a PCN reaction occurring within the last 10 years: No If all of the above answers are "NO", then may proceed with Cephalosporin use.  REACTION: swelling and difficulty breathing   Shellfish Allergy Anaphylaxis   Klonopin [Clonazepam] Other (See Comments)    Memory  difficulty   Cephalexin Hives and Swelling   Cephalosporins Hives   Current Outpatient Medications on File Prior to Visit  Medication Sig Dispense Refill   acetaminophen (TYLENOL) 650 MG CR tablet Take 650-1,300 mg by mouth every 8 (eight) hours as needed for pain.     albuterol (VENTOLIN HFA) 108 (90 Base) MCG/ACT inhaler INHALE 2 PUFFS INTO THE LUNGS EVERY 4 HOURS AS NEEDED FOR WHEEZING OR SHORTNESS OF BREATH 8.5 g 0   ALPRAZolam (XANAX) 0.5 MG tablet Take 1 tablet (0.5 mg total) by mouth 2 (two) times daily as needed for anxiety. 60 tablet 2   azelastine (ASTELIN) 0.1 % nasal spray Place 2 sprays into both nostrils 2 (two) times daily. Use in each nostril as directed 30 mL 12   BD PEN NEEDLE NANO 2ND GEN 32G X 4 MM MISC USE AS DIRECTED THREE TIMES DAILY 300 each 3   Blood Glucose Monitoring Suppl (ONETOUCH VERIO) w/Device KIT Use as directed daily E11.9 1 kit 0   clindamycin (CLEOCIN) 150 MG capsule Take 150 mg by mouth 4 (four) times daily.     fluticasone (FLONASE) 50 MCG/ACT nasal spray Place 2 sprays into both nostrils daily. 16 g 0   gabapentin (NEURONTIN) 300 MG capsule 3 tab by mouth three times daily 270 capsule 1   glucose blood (ONETOUCH VERIO) test strip USE AS DIRECTED THREE TIMES DAILY E11.9 300 strip 11   insulin aspart (NOVOLOG FLEXPEN) 100 UNIT/ML FlexPen ADMINISTER 12 UNITS UNDER THE SKIN FOUR TIMES DAILY 15 mL 2   mometasone-formoterol (DULERA) 200-5 MCG/ACT AERO Inhale 2 puffs into the lungs in the morning and at bedtime. 8.8 g 5   ondansetron (ZOFRAN) 4 MG/5ML solution Take 5 mLs (4 mg total) by mouth every 8 (eight) hours as needed for nausea or vomiting. 50 mL 0   ondansetron (ZOFRAN-ODT) 4 MG disintegrating tablet Take 1 tablet (4 mg total) by mouth every 8 (eight) hours as needed. 20 tablet 0   pantoprazole (PROTONIX) 40 MG tablet Take 1 tablet (40 mg total) by mouth 2 (two) times daily before a meal. 60 tablet 5   Plecanatide (TRULANCE) 3 MG TABS 1 tab by mouth once  daily 90 tablet 3   rizatriptan (MAXALT-MLT) 10 MG disintegrating tablet Take 1 tablet earliest onset of migraine.  May repeat in 2 hours if needed.  Maximum 2 tablets in 24 hours 9 tablet 11   rosuvastatin (CRESTOR) 40 MG tablet 1 tab by mouth once daily 90 tablet 3   sertraline (ZOLOFT) 100 MG tablet Take 1 tablet (100 mg total) by mouth 2 (two) times daily. 60 tablet 11   sucralfate (CARAFATE) 1 g tablet Take 1 tablet (1 g total) by mouth 4 (four) times daily -  with meals and at bedtime. 120 tablet 0   telmisartan-hydrochlorothiazide (MICARDIS HCT) 40-12.5 MG tablet  Take 1 tablet by mouth daily. 30 tablet 2   triamcinolone (NASACORT) 55 MCG/ACT AERO nasal inhaler Place 2 sprays into the nose daily. 1 each 12   TURMERIC PO Take 1 tablet by mouth daily.     vitamin B-12 (CYANOCOBALAMIN) 1000 MCG tablet Take 1,000 mcg by mouth daily.     Vitamin D, Ergocalciferol, (DRISDOL) 1.25 MG (50000 UNIT) CAPS capsule Take by mouth.     XOPENEX HFA 45 MCG/ACT inhaler INHALE 1 TO 2 PUFFS INOT THE LUNGS EVERY 8 HOURS AS NEEDED FOR WHEEZING OR SHORTNESS OF BREATH, USE 2 PUFFS 3 TIMES DAILY THEN BACK TO HOME REGIMEN 15 g 3   zonisamide (ZONEGRAN) 100 MG capsule Take 2 capsules (200 mg total) by mouth daily. 180 capsule 3   No current facility-administered medications on file prior to visit.        ROS:  All others reviewed and negative.  Objective        PE:  BP 124/82 (BP Location: Left Arm, Patient Position: Sitting, Cuff Size: Normal)   Pulse 80   Temp 99.2 F (37.3 C) (Oral)   Ht 5\' 7"  (1.702 m)   Wt 272 lb (123.4 kg)   LMP 07/18/2011   SpO2 98%   BMI 42.60 kg/m                 Constitutional: Pt appears in NAD               HENT: Head: NCAT.                Right Ear: External ear normal.  Right TM with mod to severe erythema with mild bulging               Left Ear: External ear normal.                Eyes: . Pupils are equal, round, and reactive to light. Conjunctivae and EOM are normal                Nose: without d/c or deformity               Neck: Neck supple. Gross normal ROM               Cardiovascular: Normal rate and regular rhythm.                 Pulmonary/Chest: Effort normal and breath sounds without rales or wheezing.                Abd:  Soft, NT, ND, + BS, no organomegaly               Neurological: Pt is alert. At baseline orientation, motor grossly intact               Skin: Skin is warm. No rashes, no other new lesions, LE edema - none               Psychiatric: Pt behavior is normal without agitation   Micro: none  Cardiac tracings I have personally interpreted today:  none  Pertinent Radiological findings (summarize): none   Lab Results  Component Value Date   WBC 6.6 09/14/2023   HGB 13.2 09/14/2023   HCT 39.5 09/14/2023   PLT 274.0 09/14/2023   GLUCOSE 84 09/14/2023   CHOL 143 09/14/2023   TRIG 99.0 09/14/2023   HDL 43.10 09/14/2023   LDLDIRECT 135.0 07/05/2011   LDLCALC 81 09/14/2023  ALT 8 09/14/2023   AST 12 09/14/2023   NA 139 09/14/2023   K 3.3 (L) 09/14/2023   CL 105 09/14/2023   CREATININE 0.75 09/14/2023   BUN 7 09/14/2023   CO2 26 09/14/2023   TSH 0.75 09/14/2023   INR 1.3 11/17/2012   HGBA1C 5.9 09/14/2023   MICROALBUR <0.7 09/14/2023   Assessment/Plan:  Nicole Melendez is a 47 y.o. Black or African American [2] female with  has a past medical history of Allergic rhinitis, cause unspecified, Anemia, Anxiety, Arthritis, COPD (chronic obstructive pulmonary disease) (HCC), Depression, Diabetes mellitus type II, Essential hypertension (08/08/2015), GERD (gastroesophageal reflux disease), History of blood transfusion, Hyperlipidemia, Menorrhagia, Migraine, Morbid obesity (HCC), Otitis media (06/28/2015), Peptic ulcer disease, Pneumonia, Positive ANA (antinuclear antibody) (02/14/2012), Sarcoid, Sarcoidosis of lung (HCC), Shortness of breath, and Varicose veins with pain.  Otitis media Mild to mod, for antibx course levaquin 500  qd,  to f/u any worsening symptoms or concerns  Diabetes mellitus with neuropathy (HCC) Lab Results  Component Value Date   HGBA1C 5.9 09/14/2023   Stable, pt to continue current medical treatment novolog 12 u qid ozempic 1 mg weekly   Essential hypertension BP Readings from Last 3 Encounters:  09/14/23 124/82  08/17/23 122/76  08/10/23 111/78   Stable, pt to continue medical treatment mciardis hct - 40 12.5 qd   Hyperlipidemia Lab Results  Component Value Date   LDLCALC 81 09/14/2023   Uncontrolled,  pt to continue current statin crestor 40 every day, consider add repatha but for diet for now   Vitamin D deficiency Last vitamin D Lab Results  Component Value Date   VD25OH 19.94 (L) 09/14/2023   Low, to start oral replacement   Blurred vision, bilateral Etiology unclear, for optho referral  Followup: Return in about 6 months (around 03/13/2024).  [name], MD 09/17/2023 4:48 PM  [facility]

## 2023-09-17 ENCOUNTER — Encounter: Payer: Self-pay | Admitting: Internal Medicine

## 2023-09-17 DIAGNOSIS — H538 Other visual disturbances: Secondary | ICD-10-CM | POA: Insufficient documentation

## 2023-09-17 NOTE — Assessment & Plan Note (Signed)
BP Readings from Last 3 Encounters:  09/14/23 124/82  08/17/23 122/76  08/10/23 111/78   Stable, pt to continue medical treatment mciardis hct - 40 12.5 qd

## 2023-09-17 NOTE — Assessment & Plan Note (Signed)
Etiology unclear, for optho referral

## 2023-09-17 NOTE — Assessment & Plan Note (Signed)
Lab Results  Component Value Date   LDLCALC 81 09/14/2023   Uncontrolled,  pt to continue current statin crestor 40 every day, consider add repatha but for diet for now

## 2023-09-17 NOTE — Assessment & Plan Note (Signed)
Mild to mod, for antibx course levaquin 500 qd,  to f/u any worsening symptoms or concerns

## 2023-09-17 NOTE — Assessment & Plan Note (Signed)
Last vitamin D Lab Results  Component Value Date   VD25OH 19.94 (L) 09/14/2023   Low, to start oral replacement

## 2023-09-17 NOTE — Assessment & Plan Note (Signed)
Lab Results  Component Value Date   HGBA1C 5.9 09/14/2023   Stable, pt to continue current medical treatment novolog 12 u qid ozempic 1 mg weekly

## 2023-09-18 ENCOUNTER — Other Ambulatory Visit: Payer: Self-pay | Admitting: Internal Medicine

## 2023-09-19 ENCOUNTER — Other Ambulatory Visit: Payer: Self-pay

## 2023-09-20 ENCOUNTER — Other Ambulatory Visit: Payer: Self-pay

## 2023-09-20 ENCOUNTER — Other Ambulatory Visit: Payer: Self-pay | Admitting: Internal Medicine

## 2023-09-26 DIAGNOSIS — F411 Generalized anxiety disorder: Secondary | ICD-10-CM | POA: Diagnosis not present

## 2023-09-26 DIAGNOSIS — M5440 Lumbago with sciatica, unspecified side: Secondary | ICD-10-CM | POA: Diagnosis not present

## 2023-09-26 DIAGNOSIS — E119 Type 2 diabetes mellitus without complications: Secondary | ICD-10-CM | POA: Diagnosis not present

## 2023-09-26 DIAGNOSIS — M539 Dorsopathy, unspecified: Secondary | ICD-10-CM | POA: Diagnosis not present

## 2023-09-26 DIAGNOSIS — Z6841 Body Mass Index (BMI) 40.0 and over, adult: Secondary | ICD-10-CM | POA: Diagnosis not present

## 2023-09-26 DIAGNOSIS — F410 Panic disorder [episodic paroxysmal anxiety] without agoraphobia: Secondary | ICD-10-CM | POA: Diagnosis not present

## 2023-09-26 DIAGNOSIS — M7061 Trochanteric bursitis, right hip: Secondary | ICD-10-CM | POA: Diagnosis not present

## 2023-09-26 DIAGNOSIS — Z79899 Other long term (current) drug therapy: Secondary | ICD-10-CM | POA: Diagnosis not present

## 2023-10-03 ENCOUNTER — Encounter (HOSPITAL_BASED_OUTPATIENT_CLINIC_OR_DEPARTMENT_OTHER): Payer: Self-pay

## 2023-10-03 ENCOUNTER — Encounter (HOSPITAL_BASED_OUTPATIENT_CLINIC_OR_DEPARTMENT_OTHER): Payer: Medicaid Other

## 2023-10-04 ENCOUNTER — Ambulatory Visit (HOSPITAL_BASED_OUTPATIENT_CLINIC_OR_DEPARTMENT_OTHER): Payer: Medicaid Other

## 2023-10-04 DIAGNOSIS — D860 Sarcoidosis of lung: Secondary | ICD-10-CM | POA: Diagnosis not present

## 2023-10-04 LAB — PULMONARY FUNCTION TEST
DL/VA % pred: 113 %
DL/VA: 4.82 ml/min/mmHg/L
DLCO cor % pred: 85 %
DLCO cor: 19.36 ml/min/mmHg
DLCO unc % pred: 85 %
DLCO unc: 19.24 ml/min/mmHg
FEF 25-75 Post: 2.32 L/s
FEF 25-75 Pre: 2.21 L/s
FEF2575-%Change-Post: 5 %
FEF2575-%Pred-Post: 79 %
FEF2575-%Pred-Pre: 75 %
FEV1-%Change-Post: 0 %
FEV1-%Pred-Post: 77 %
FEV1-%Pred-Pre: 76 %
FEV1-Post: 2.33 L
FEV1-Pre: 2.31 L
FEV1FVC-%Change-Post: 0 %
FEV1FVC-%Pred-Pre: 100 %
FEV6-%Change-Post: 0 %
FEV6-%Pred-Post: 76 %
FEV6-%Pred-Pre: 77 %
FEV6-Post: 2.85 L
FEV6-Pre: 2.87 L
FEV6FVC-%Change-Post: 0 %
FEV6FVC-%Pred-Post: 102 %
FEV6FVC-%Pred-Pre: 102 %
FVC-%Change-Post: 0 %
FVC-%Pred-Post: 76 %
FVC-%Pred-Pre: 75 %
FVC-Post: 2.89 L
FVC-Pre: 2.87 L
Post FEV1/FVC ratio: 80 %
Post FEV6/FVC ratio: 100 %
Pre FEV1/FVC ratio: 81 %
Pre FEV6/FVC Ratio: 100 %
RV % pred: 112 %
RV: 2.1 L
TLC % pred: 93 %
TLC: 5.01 L

## 2023-10-04 NOTE — Patient Instructions (Signed)
Full PFT Performed Today  

## 2023-10-04 NOTE — Progress Notes (Signed)
Full PFT Performed Today  

## 2023-10-25 ENCOUNTER — Ambulatory Visit: Payer: Medicaid Other | Admitting: Internal Medicine

## 2023-10-27 DIAGNOSIS — M5440 Lumbago with sciatica, unspecified side: Secondary | ICD-10-CM | POA: Diagnosis not present

## 2023-10-27 DIAGNOSIS — E119 Type 2 diabetes mellitus without complications: Secondary | ICD-10-CM | POA: Diagnosis not present

## 2023-10-27 DIAGNOSIS — F411 Generalized anxiety disorder: Secondary | ICD-10-CM | POA: Diagnosis not present

## 2023-10-27 DIAGNOSIS — M539 Dorsopathy, unspecified: Secondary | ICD-10-CM | POA: Diagnosis not present

## 2023-10-27 DIAGNOSIS — M7061 Trochanteric bursitis, right hip: Secondary | ICD-10-CM | POA: Diagnosis not present

## 2023-10-27 DIAGNOSIS — Z6841 Body Mass Index (BMI) 40.0 and over, adult: Secondary | ICD-10-CM | POA: Diagnosis not present

## 2023-10-27 DIAGNOSIS — Z79899 Other long term (current) drug therapy: Secondary | ICD-10-CM | POA: Diagnosis not present

## 2023-10-27 DIAGNOSIS — F410 Panic disorder [episodic paroxysmal anxiety] without agoraphobia: Secondary | ICD-10-CM | POA: Diagnosis not present

## 2023-10-31 DIAGNOSIS — Z79899 Other long term (current) drug therapy: Secondary | ICD-10-CM | POA: Diagnosis not present

## 2023-11-05 ENCOUNTER — Other Ambulatory Visit: Payer: Self-pay | Admitting: Neurology

## 2023-11-07 ENCOUNTER — Other Ambulatory Visit (HOSPITAL_COMMUNITY): Payer: Self-pay

## 2023-11-10 ENCOUNTER — Ambulatory Visit
Admission: RE | Admit: 2023-11-10 | Discharge: 2023-11-10 | Disposition: A | Discharge status: Home / Self Care | Payer: Medicaid Other | Source: Ambulatory Visit | Attending: Family Medicine | Admitting: Family Medicine

## 2023-11-10 VITALS — BP 117/74 | HR 84 | Temp 97.7°F | Resp 18 | Ht 66.0 in | Wt 247.0 lb

## 2023-11-10 DIAGNOSIS — J029 Acute pharyngitis, unspecified: Secondary | ICD-10-CM

## 2023-11-10 DIAGNOSIS — B349 Viral infection, unspecified: Secondary | ICD-10-CM

## 2023-11-10 LAB — POCT RAPID STREP A (OFFICE): Rapid Strep A Screen: NEGATIVE

## 2023-11-10 LAB — POC COVID19/FLU A&B COMBO
Covid Antigen, POC: NEGATIVE
Influenza A Antigen, POC: NEGATIVE
Influenza B Antigen, POC: NEGATIVE

## 2023-11-10 NOTE — ED Provider Notes (Signed)
[facility]    CSN: [id number] Arrival date & time: 11/10/23  1104      History   Chief Complaint Chief Complaint  Patient presents with   Chills    Sore throat and ears hurting - Entered by patient   Sore Throat   Otalgia    HPI JAHLIYA OSHINSKI is a 48 y.o. female.    Sore Throat Associated symptoms include headaches. Pertinent negatives include no shortness of breath.  Otalgia Associated symptoms: congestion, cough, headaches, rhinorrhea, sore throat and vomiting   Associated symptoms: no diarrhea and no fever   Not feeling well for 3 days symptoms include sore throat, ear pain, chills, body aches, fatigue.  Admits dry cough which is not unusual, has history of sarcoidosis.  Has documented fever however has been taking Tylenol daily.  Admits chills. Admits work contact with recent flu and strep infection.  Had multiple episodes of vomiting 2 days ago  Past Medical History:  Diagnosis Date   Allergic rhinitis, cause unspecified    Anemia    Anxiety    Arthritis    COPD (chronic obstructive pulmonary disease) (HCC)    Depression    Diabetes mellitus type II    steroid related, patient states "im no longer diabetic"   Essential hypertension 08/08/2015   GERD (gastroesophageal reflux disease)    History of blood transfusion    Hyperlipidemia    Menorrhagia    Migraine    Morbid obesity (HCC)    Otitis media 06/28/2015   Peptic ulcer disease    Pneumonia    Positive ANA (antinuclear antibody) 02/14/2012   Sarcoid    including hand per rheumatology-Dr. 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(HCC)    Shortness of breath    on exertion   Varicose veins with pain     Patient Active Problem List   Diagnosis Date Noted   Blurred vision, bilateral 09/17/2023   Dyspepsia 08/17/2023   Acute recurrent pancreatitis 08/01/2023   Left ear pain 07/23/2023   OSA (obstructive sleep apnea) 06/17/2023   Constipation 03/23/2023   Nail disorder 12/25/2022   Cellulitis  of great toe 12/22/2022   Acute upper respiratory infection 09/06/2022   Vitamin D deficiency 10/21/2020   Right low back pain 10/15/2020   Recurrent falls while walking 10/15/2020   COVID-19 virus infection 09/28/2020   Exposure to COVID-19 virus 07/10/2020   Viral illness 07/10/2020   Sinusitis 05/23/2020   Allergic conjunctivitis 05/23/2020   Yeast vaginitis 05/23/2020   Polyarthralgia 02/20/2020   Tibialis posterior tendon tear, nontraumatic, right 02/08/2020   Neck pain 12/14/2019   COPD (chronic obstructive pulmonary disease) (HCC) 11/07/2019   Suspected COVID-19 virus infection 08/02/2019   Hematochezia 04/05/2019   Snoring 11/17/2018   Eye symptom 08/03/2018   Right lumbar radiculopathy 05/08/2018   Chronic pain 10/11/2017   Sarcoidosis of lung (HCC)    Acute on chronic diastolic CHF (congestive heart failure) (HCC) 09/22/2017   Rheumatoid arthritis (HCC) 09/18/2017   Chronic diastolic CHF (congestive heart failure) (HCC) 09/18/2017   Pancreatitis 06/26/2017   Right ankle sprain 06/26/2017   Possible exposure to STD 06/15/2017   Diarrhea 04/10/2017   Bilateral hand swelling 04/07/2017   Abdominal pain 04/07/2017   Abnormality of gait 01/26/2017   Avulsion fracture of right ankle 12/01/2016   Ulcer of esophagus with bleeding    Gastritis and gastroduodenitis    Blepharitis of right upper eyelid 10/09/2016   Peroneal tendinitis of lower leg, right 10/06/2016  Right ankle pain 09/16/2016   Dizziness 03/11/2016   Mixed headache 03/09/2016   Chronic venous insufficiency 02/24/2016   Gastroenteritis 12/30/2015   Headache 12/30/2015   Epistaxis 12/09/2015   Post concussive syndrome 12/03/2015   Cervical muscle strain 12/03/2015   Right-sided face pain 11/26/2015   Multiple contusions 11/26/2015   Neck pain, bilateral posterior 11/26/2015   Low back pain 11/26/2015   Spinal stenosis of lumbar region 10/28/2015   Chronic sciatica of left side 10/28/2015   DOE  (dyspnea on exertion) 09/02/2015   Essential hypertension 08/08/2015   Diaphoresis 08/07/2015   Cough 07/02/2015   Vaginitis and vulvovaginitis 06/29/2015   Otitis media 06/28/2015   Thrush 06/24/2015   Nausea & vomiting 06/10/2015   Wheezing 11/14/2014   Angioedema 03/19/2014   Vaginitis 11/19/2012   Smoker 11/19/2012   Positive ANA (antinuclear antibody) 02/14/2012   Polyarthritis 02/11/2012   Migraine 04/05/2011   Encounter for well adult exam with abnormal findings 02/21/2011   Allergic rhinitis 02/17/2011   URTICARIA 09/29/2010   Depression 09/14/2010   Diabetes mellitus with neuropathy (HCC) 07/31/2010   PERIPHERAL EDEMA 07/31/2010   INSOMNIA-SLEEP DISORDER-UNSPEC 06/19/2010   MENORRHAGIA 05/15/2010   Hyperlipidemia 12/17/2009   Iron deficiency anemia 12/17/2009   Anxiety state 12/17/2009   Morbid obesity (HCC) 07/02/2007   Gastroesophageal reflux disease 07/02/2007   PEPTIC ULCER DISEASE 07/02/2007    Past Surgical History:  Procedure Laterality Date   ABDOMINAL HYSTERECTOMY     COLONOSCOPY WITH PROPOFOL N/A 10/19/2016   Procedure: COLONOSCOPY WITH PROPOFOL;  Surgeon: Napoleon Form, MD;  Location: [facility];  Service: Endoscopy;  Laterality: N/A;   ESOPHAGOGASTRODUODENOSCOPY (EGD) WITH PROPOFOL N/A 10/19/2016   Procedure: ESOPHAGOGASTRODUODENOSCOPY (EGD) WITH PROPOFOL;  Surgeon: Napoleon Form, MD;  Location: [facility];  Service: Endoscopy;  Laterality: N/A;   HERNIA MESH REMOVAL  02/2013   HERNIA REPAIR     KNEE ARTHROSCOPY Left 01/15/2022   Procedure: ARTHROSCOPY KNEE;  Surgeon: Beverely Low, MD;  Location: [facility];  Service: Orthopedics;  Laterality: Left;   uterine ablation  03/2010   WISDOM TOOTH EXTRACTION      OB History   No obstetric history on file.      Home Medications    Prior to Admission medications   Medication Sig Start Date End Date Taking? Authorizing Provider  acetaminophen (TYLENOL) 650 MG CR tablet Take 650-1,300 mg by  mouth every 8 (eight) hours as needed for pain.    [provider]  albuterol (VENTOLIN HFA) 108 (90 Base) MCG/ACT inhaler INHALE 2 PUFFS INTO THE LUNGS EVERY 4 HOURS AS NEEDED FOR WHEEZING OR SHORTNESS OF BREATH 03/31/20   Byrum, Les Pou, MD  ALPRAZolam Prudy Feeler) 0.5 MG tablet Take 1 tablet (0.5 mg total) by mouth 2 (two) times daily as needed for anxiety. 08/30/23   [name], MD  azelastine (ASTELIN) 0.1 % nasal spray Place 2 sprays into both nostrils 2 (two) times daily. Use in each nostril as directed 04/01/21   [name], MD  baclofen (LIORESAL) 10 MG tablet Take 1 tablet (10 mg total) by mouth 3 (three) times daily. 09/14/23   [name], MD  BD PEN NEEDLE NANO 2ND GEN 32G X 4 MM MISC USE AS DIRECTED THREE TIMES DAILY 03/17/21   [name], MD  Blood Glucose Monitoring Suppl [facility] VERIO) w/Device KIT Use as directed daily E11.9 03/25/23   [name], MD  clindamycin (CLEOCIN) 150 MG capsule Take 150 mg by  mouth 4 (four) times daily. 01/10/23   [provider]  fluticasone (FLONASE) 50 MCG/ACT nasal spray Place 2 sprays into both nostrils daily. 05/18/23   Eustace Moore, MD  gabapentin (NEURONTIN) 300 MG capsule 3 tab by mouth three times daily 04/20/23   [name], MD  glucose blood [facility] VERIO) test strip USE AS DIRECTED THREE TIMES DAILY E11.9 03/22/23   [name], MD  insulin aspart (NOVOLOG FLEXPEN) 100 UNIT/ML FlexPen ADMINISTER 12 UNITS UNDER THE SKIN FOUR TIMES DAILY 03/22/23   [name], MD  levofloxacin (LEVAQUIN) 500 MG tablet Take 1 tablet (500 mg total) by mouth daily. 09/14/23   [name], MD  mometasone-formoterol [facility]) 200-5 MCG/ACT AERO Inhale 2 puffs into the lungs in the morning and at bedtime. 05/11/23   Leslye Peer, MD  ondansetron [facility]) 4 MG/5ML solution Take 5 mLs (4 mg total) by mouth every 8 (eight) hours as needed for nausea or vomiting. 06/01/23   Jacalyn Lefevre, MD  ondansetron (ZOFRAN-ODT) 4 MG disintegrating  tablet Take 1 tablet (4 mg total) by mouth every 8 (eight) hours as needed. 07/26/23   Ernie Avena, MD  oxyCODONE-acetaminophen (PERCOCET) 10-325 MG tablet Take 1 tablet by mouth every 6 (six) hours as needed for pain. Per pain management 09/14/23 09/13/24  [name], MD  pantoprazole (PROTONIX) 40 MG tablet Take 1 tablet (40 mg total) by mouth 2 (two) times daily before a meal. 08/17/23 08/16/24  [name], MD  Plecanatide (TRULANCE) 3 MG TABS 1 tab by mouth once daily 03/22/23   [name], MD  Rimegepant Sulfate (NURTEC) 75 MG TBDP 1 tab by mouth once daily as needed 09/14/23   [name], MD  rizatriptan (MAXALT-MLT) 10 MG disintegrating tablet Take 1 tablet earliest onset of migraine.  May repeat in 2 hours if needed.  Maximum 2 tablets in 24 hours 12/27/22   Drema Dallas, DO  rosuvastatin (CRESTOR) 40 MG tablet 1 tab by mouth once daily 04/21/23   [name], MD  Semaglutide, 1 MG/DOSE, 4 MG/3ML SOPN Inject 1 mg as directed once a week. 09/14/23   [name], MD  sertraline (ZOLOFT) 100 MG tablet Take 1 tablet (100 mg total) by mouth 2 (two) times daily. 04/08/23   [name], MD  sucralfate (CARAFATE) 1 g tablet TAKE 1 TABLET(1 GRAM) BY MOUTH FOUR TIMES DAILY AND AT BEDTIME WITH MEALS 09/20/23   [name], MD  telmisartan-hydrochlorothiazide (MICARDIS HCT) 40-12.5 MG tablet TAKE 1 TABLET BY MOUTH DAILY 09/19/23   [name], MD  triamcinolone (NASACORT) 55 MCG/ACT AERO nasal inhaler Place 2 sprays into the nose daily. 08/17/23   [name], MD  TURMERIC PO Take 1 tablet by mouth daily.    [provider]  vitamin B-12 (CYANOCOBALAMIN) 1000 MCG tablet Take 1,000 mcg by mouth daily.    [provider]  Vitamin D, Ergocalciferol, (DRISDOL) 1.25 MG (50000 UNIT) CAPS capsule Take by mouth. 02/08/20   [provider]  XOPENEX HFA 45 MCG/ACT inhaler INHALE 1 TO 2 PUFFS INOT THE LUNGS EVERY 8 HOURS AS NEEDED FOR WHEEZING OR SHORTNESS OF BREATH,  USE 2 PUFFS 3 TIMES DAILY THEN BACK TO HOME REGIMEN 09/05/23   Leslye Peer, MD  zonisamide (ZONEGRAN) 100 MG capsule TAKE 2 CAPSULES(200 MG) BY MOUTH DAILY 11/08/23   Drema Dallas, DO    Family History Family History  Problem Relation Age  of Onset   Allergies Mother    Heart attack Mother    Diabetes Mother    Diabetes Father    Heart disease Father    Rheum arthritis Father    Stroke Father    Hypertension Other    Ovarian cancer Maternal Aunt    Lung cancer Maternal Aunt    Breast cancer Maternal Aunt    Bone cancer Maternal Aunt    Stroke Maternal Uncle    Other Neg Hx     Social History Social History   Tobacco Use   Smoking status: Former    Current packs/day: 0.00    Average packs/day: 1 pack/day for 20.0 years (20.0 ttl pk-yrs)    Types: Cigarettes    Start date: 10/25/1992    Quit date: 10/25/2012    Years since quitting: 11.0   Smokeless tobacco: Never   Tobacco comments:    QUIT 04/2010 AND STARTED BACK 2014 X 3 MONTHS. less than 1 ppd.  started at age 31.    Vaping Use   Vaping status: Never Used  Substance Use Topics   Alcohol use: No   Drug use: No     Allergies   Azithromycin, Bee venom, Flagyl [metronidazole], Penicillins, Shellfish allergy, Klonopin [clonazepam], Cephalexin, and Cephalosporins   Review of Systems Review of Systems  Constitutional:  Positive for chills and fatigue. Negative for diaphoresis and fever.  HENT:  Positive for congestion, ear pain, rhinorrhea and sore throat. Negative for sinus pain, trouble swallowing and voice change.   Respiratory:  Positive for cough. Negative for shortness of breath.   Gastrointestinal:  Positive for nausea and vomiting. Negative for diarrhea.  Neurological:  Positive for headaches.     Physical Exam Triage Vital Signs ED Triage Vitals  Encounter Vitals Group     BP 11/10/23 1115 117/74     Systolic BP Percentile --      Diastolic BP Percentile --      Pulse Rate 11/10/23 1115 84      Resp 11/10/23 1115 18     Temp 11/10/23 1115 97.7 F (36.5 C)     Temp Source 11/10/23 1115 Oral     SpO2 11/10/23 1115 95 %     Weight 11/10/23 1113 247 lb (112 kg)     Height 11/10/23 1113 5\' 6"  (1.676 m)     Head Circumference --      Peak Flow --      Pain Score 11/10/23 1113 8     Pain Loc --      Pain Education --      Exclude from Growth Chart --    No data found.  Updated Vital Signs BP 117/74 (BP Location: Right Arm)   Pulse 84   Temp 97.7 F (36.5 C) (Oral)   Resp 18   Ht 5\' 6"  (1.676 m)   Wt 247 lb (112 kg)   LMP 07/18/2011   SpO2 95%   BMI 39.87 kg/m   Visual Acuity Right Eye Distance:   Left Eye Distance:   Bilateral Distance:    Right Eye Near:   Left Eye Near:    Bilateral Near:     Physical Exam Vitals and nursing note reviewed.  Constitutional:      Appearance: She is not ill-appearing.  HENT:     Head: Normocephalic.     Right Ear: Tympanic membrane and ear canal normal.     Left Ear: Tympanic membrane and ear canal normal.  Nose: No rhinorrhea.     Mouth/Throat:     Mouth: Mucous membranes are moist.     Pharynx: Uvula midline. No pharyngeal swelling, oropharyngeal exudate, posterior oropharyngeal erythema or uvula swelling.     Tonsils: No tonsillar abscesses.  Eyes:     Conjunctiva/sclera: Conjunctivae normal.  Cardiovascular:     Rate and Rhythm: Normal rate and regular rhythm.  Musculoskeletal:     Cervical back: Neck supple.  Lymphadenopathy:     Cervical: No cervical adenopathy.  Skin:    General: Skin is warm and dry.  Neurological:     Mental Status: She is alert and oriented to person, place, and time.      UC Treatments / Results  Labs (all labs ordered are listed, but only abnormal results are displayed) Labs Reviewed  POC COVID19/FLU A&B COMBO  POCT RAPID STREP A (OFFICE)    EKG   Radiology No results found.  Procedures Procedures (including critical care time)  Medications Ordered in UC Medications  - No data to display  Initial Impression / Assessment and Plan / UC Course  I have reviewed the triage vital signs and the nursing notes.  Pertinent labs & imaging results that were available during my care of the patient were reviewed by me and considered in my medical decision making (see chart for details).   48 year old sick for 3 days, complaining mostly of sore throat and ear pain.  Had COVID and strep exposures at work. she is afebrile, well-appearing, nontoxic, well-hydrated.  Has normal swallowing and clear voice.  Point-of-care strep negative. Discussed with patient viral etiology recommend OTC meds for symptomatic relief, warning signs and follow-up reviewed Final Clinical Impressions(s) / UC Diagnoses   Final diagnoses:  Sore throat   Discharge Instructions   None    ED Prescriptions   None    PDMP not reviewed this encounter.   Meliton Rattan, [place] 11/10/23 1148

## 2023-11-10 NOTE — Discharge Instructions (Addendum)
Over the counter medicines as directed on the package for symptoms

## 2023-11-10 NOTE — ED Triage Notes (Addendum)
Pt presents with complaints of bilateral ear pain, chills, and sore throat x 3 days. Pt has been taking OTC Robitussin, Tylenol & cough drops for pain and symptoms with no relief. Pt currently rates her overall pain an 8/10. Fever reported last night. Describes ear pain as throbbing.

## 2023-11-16 ENCOUNTER — Ambulatory Visit: Payer: Medicaid Other

## 2023-11-16 ENCOUNTER — Ambulatory Visit
Admission: EM | Admit: 2023-11-16 | Discharge: 2023-11-16 | Disposition: A | Discharge status: Home / Self Care | Payer: Medicaid Other

## 2023-11-16 DIAGNOSIS — K529 Noninfective gastroenteritis and colitis, unspecified: Secondary | ICD-10-CM

## 2023-11-16 DIAGNOSIS — R112 Nausea with vomiting, unspecified: Secondary | ICD-10-CM | POA: Diagnosis not present

## 2023-11-16 DIAGNOSIS — R197 Diarrhea, unspecified: Secondary | ICD-10-CM

## 2023-11-16 DIAGNOSIS — E1159 Type 2 diabetes mellitus with other circulatory complications: Secondary | ICD-10-CM

## 2023-11-16 NOTE — ED Triage Notes (Signed)
Pt states vomiting and diarrhea since yesterday. States she took Pepto at home.

## 2023-11-16 NOTE — Discharge Instructions (Signed)
Your evaluation suggests that your symptoms are most likely due to viral stomach illness (gastroenteritis/"stomach bug") which will improve on its own with rest and fluids in the next few days.   Take zofran to help with nausea every 8 hours as needed. You may use over the counter medicines for aches and pains such as tylenol as needed.  Start sipping on liquids (broth, water, gatorade, etc). If you are able to keep liquids down without vomiting for 1-2 hours, you may eat bland foods like jello, pudding, applesauce, bananas, rice, and white toast. Once you can tolerate blands, you may return to normal diet.   Pedialyte or gatorolyte may help to prevent/fix dehydration due to vomiting and diarrhea.  Please follow up with your primary care provider for further management. Return if you experience worsening or uncontrolled pain, inability to tolerate fluids by mouth, difficulty breathing, fevers 100.78F or greater, recurrent vomiting, or any other concerning symptoms.

## 2023-11-16 NOTE — ED Provider Notes (Signed)
[facility]    CSN: [id number] Arrival date & time: 11/16/23  6962      History   Chief Complaint Chief Complaint  Patient presents with   Diarrhea    HPI ALDA GAUTREAUX is a 48 y.o. female.   SHATORI LIBERA is a 48 y.o. female presenting for chief complaint of nausea, vomiting, and diarrhea that started yesterday. She has had 8 episodes of non-bilious/non-bloody emesis over the last 24 hours. Last episode of vomiting was yesterday and she has been able to tolerate ginger ale/gatorade since. She is mostly concerned about persistent diarrhea for the last 24 hours. She has zofran at home and has been taking this with relief of nausea. She has also been taking immodium though is not finding relief of diarrhea with this medication. Reports intermittent generalized abdominal discomfort associated with N/V/D and states pain is tolerable. History of partial hysterectomy and hernia repairs, no other abdominal surgeries reported. No recent antibiotic/steroid use, dizziness, CP, SOB, urinary symptoms, flank pain, fever, chills, or rash. Coworkers have been sick with GI illness recently. She is a diabetic and denies signs/symptoms of hypo/hyperglycemia.    Diarrhea   Past Medical History:  Diagnosis Date   Allergic rhinitis, cause unspecified    Anemia    Anxiety    Arthritis    COPD (chronic obstructive pulmonary disease) (HCC)    Depression    Diabetes mellitus type II    steroid related, patient states "im no longer diabetic"   Essential hypertension 08/08/2015   GERD (gastroesophageal reflux disease)    History of blood transfusion    Hyperlipidemia    Menorrhagia    Migraine    Morbid obesity (HCC)    Otitis media 06/28/2015   Peptic ulcer disease    Pneumonia    Positive ANA (antinuclear antibody) 02/14/2012   Sarcoid    including hand per rheumatology-Dr. 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(HCC)    Shortness of breath    on exertion   Varicose veins with pain      Patient Active Problem List   Diagnosis Date Noted   Blurred vision, bilateral 09/17/2023   Dyspepsia 08/17/2023   Acute recurrent pancreatitis 08/01/2023   Left ear pain 07/23/2023   OSA (obstructive sleep apnea) 06/17/2023   Constipation 03/23/2023   Nail disorder 12/25/2022   Cellulitis of great toe 12/22/2022   Acute upper respiratory infection 09/06/2022   Vitamin D deficiency 10/21/2020   Right low back pain 10/15/2020   Recurrent falls while walking 10/15/2020   COVID-19 virus infection 09/28/2020   Exposure to COVID-19 virus 07/10/2020   Viral illness 07/10/2020   Sinusitis 05/23/2020   Allergic conjunctivitis 05/23/2020   Yeast vaginitis 05/23/2020   Polyarthralgia 02/20/2020   Tibialis posterior tendon tear, nontraumatic, right 02/08/2020   Neck pain 12/14/2019   COPD (chronic obstructive pulmonary disease) (HCC) 11/07/2019   Suspected COVID-19 virus infection 08/02/2019   Hematochezia 04/05/2019   Snoring 11/17/2018   Eye symptom 08/03/2018   Right lumbar radiculopathy 05/08/2018   Chronic pain 10/11/2017   Sarcoidosis of lung (HCC)    Acute on chronic diastolic CHF (congestive heart failure) (HCC) 09/22/2017   Rheumatoid arthritis (HCC) 09/18/2017   Chronic diastolic CHF (congestive heart failure) (HCC) 09/18/2017   Pancreatitis 06/26/2017   Right ankle sprain 06/26/2017   Possible exposure to STD 06/15/2017   Diarrhea 04/10/2017   Bilateral hand swelling 04/07/2017   Abdominal pain 04/07/2017   Abnormality of gait 01/26/2017  Avulsion fracture of right ankle 12/01/2016   Ulcer of esophagus with bleeding    Gastritis and gastroduodenitis    Blepharitis of right upper eyelid 10/09/2016   Peroneal tendinitis of lower leg, right 10/06/2016   Right ankle pain 09/16/2016   Dizziness 03/11/2016   Mixed headache 03/09/2016   Chronic venous insufficiency 02/24/2016   Gastroenteritis 12/30/2015   Headache 12/30/2015   Epistaxis 12/09/2015   Post  concussive syndrome 12/03/2015   Cervical muscle strain 12/03/2015   Right-sided face pain 11/26/2015   Multiple contusions 11/26/2015   Neck pain, bilateral posterior 11/26/2015   Low back pain 11/26/2015   Spinal stenosis of lumbar region 10/28/2015   Chronic sciatica of left side 10/28/2015   DOE (dyspnea on exertion) 09/02/2015   Essential hypertension 08/08/2015   Diaphoresis 08/07/2015   Cough 07/02/2015   Vaginitis and vulvovaginitis 06/29/2015   Otitis media 06/28/2015   Thrush 06/24/2015   Nausea & vomiting 06/10/2015   Wheezing 11/14/2014   Angioedema 03/19/2014   Vaginitis 11/19/2012   Smoker 11/19/2012   Positive ANA (antinuclear antibody) 02/14/2012   Polyarthritis 02/11/2012   Migraine 04/05/2011   Encounter for well adult exam with abnormal findings 02/21/2011   Allergic rhinitis 02/17/2011   URTICARIA 09/29/2010   Depression 09/14/2010   Diabetes mellitus with neuropathy (HCC) 07/31/2010   PERIPHERAL EDEMA 07/31/2010   INSOMNIA-SLEEP DISORDER-UNSPEC 06/19/2010   MENORRHAGIA 05/15/2010   Hyperlipidemia 12/17/2009   Iron deficiency anemia 12/17/2009   Anxiety state 12/17/2009   Morbid obesity (HCC) 07/02/2007   Gastroesophageal reflux disease 07/02/2007   PEPTIC ULCER DISEASE 07/02/2007    Past Surgical History:  Procedure Laterality Date   ABDOMINAL HYSTERECTOMY     COLONOSCOPY WITH PROPOFOL N/A 10/19/2016   Procedure: COLONOSCOPY WITH PROPOFOL;  Surgeon: Napoleon Form, MD;  Location: [facility];  Service: Endoscopy;  Laterality: N/A;   ESOPHAGOGASTRODUODENOSCOPY (EGD) WITH PROPOFOL N/A 10/19/2016   Procedure: ESOPHAGOGASTRODUODENOSCOPY (EGD) WITH PROPOFOL;  Surgeon: Napoleon Form, MD;  Location: [facility];  Service: Endoscopy;  Laterality: N/A;   HERNIA MESH REMOVAL  02/2013   HERNIA REPAIR     KNEE ARTHROSCOPY Left 01/15/2022   Procedure: ARTHROSCOPY KNEE;  Surgeon: Beverely Low, MD;  Location: [facility];  Service: Orthopedics;   Laterality: Left;   uterine ablation  03/2010   WISDOM TOOTH EXTRACTION      OB History   No obstetric history on file.      Home Medications    Prior to Admission medications   Medication Sig Start Date End Date Taking? Authorizing Provider  acetaminophen (TYLENOL) 650 MG CR tablet Take 650-1,300 mg by mouth every 8 (eight) hours as needed for pain.    [provider]  albuterol (VENTOLIN HFA) 108 (90 Base) MCG/ACT inhaler INHALE 2 PUFFS INTO THE LUNGS EVERY 4 HOURS AS NEEDED FOR WHEEZING OR SHORTNESS OF BREATH 03/31/20   Byrum, Les Pou, MD  ALPRAZolam Prudy Feeler) 0.5 MG tablet Take 1 tablet (0.5 mg total) by mouth 2 (two) times daily as needed for anxiety. 08/30/23   [name], MD  azelastine (ASTELIN) 0.1 % nasal spray Place 2 sprays into both nostrils 2 (two) times daily. Use in each nostril as directed 04/01/21   [name], MD  baclofen (LIORESAL) 10 MG tablet Take 1 tablet (10 mg total) by mouth 3 (three) times daily. 09/14/23   [name], MD  BD PEN NEEDLE NANO 2ND GEN 32G X 4 MM MISC USE AS DIRECTED THREE TIMES  DAILY 03/17/21   [name], MD  Blood Glucose Monitoring Suppl [facility] VERIO) w/Device KIT Use as directed daily E11.9 03/25/23   [name], MD  clindamycin (CLEOCIN) 150 MG capsule Take 150 mg by mouth 4 (four) times daily. 01/10/23   [provider]  fluticasone (FLONASE) 50 MCG/ACT nasal spray Place 2 sprays into both nostrils daily. 05/18/23   Eustace Moore, MD  gabapentin (NEURONTIN) 300 MG capsule 3 tab by mouth three times daily 04/20/23   [name], MD  glucose blood [facility] VERIO) test strip USE AS DIRECTED THREE TIMES DAILY E11.9 03/22/23   [name], MD  insulin aspart (NOVOLOG FLEXPEN) 100 UNIT/ML FlexPen ADMINISTER 12 UNITS UNDER THE SKIN FOUR TIMES DAILY 03/22/23   [name], MD  levofloxacin (LEVAQUIN) 500 MG tablet Take 1 tablet (500 mg total) by mouth daily. 09/14/23   [name], MD  mometasone-formoterol  [facility]) 200-5 MCG/ACT AERO Inhale 2 puffs into the lungs in the morning and at bedtime. 05/11/23   Leslye Peer, MD  ondansetron [facility]) 4 MG/5ML solution Take 5 mLs (4 mg total) by mouth every 8 (eight) hours as needed for nausea or vomiting. 06/01/23   Jacalyn Lefevre, MD  ondansetron (ZOFRAN-ODT) 4 MG disintegrating tablet Take 1 tablet (4 mg total) by mouth every 8 (eight) hours as needed. 07/26/23   Ernie Avena, MD  oxyCODONE-acetaminophen (PERCOCET) 10-325 MG tablet Take 1 tablet by mouth every 6 (six) hours as needed for pain. Per pain management 09/14/23 09/13/24  [name], MD  pantoprazole (PROTONIX) 40 MG tablet Take 1 tablet (40 mg total) by mouth 2 (two) times daily before a meal. 08/17/23 08/16/24  [name], MD  Plecanatide (TRULANCE) 3 MG TABS 1 tab by mouth once daily 03/22/23   [name], MD  Rimegepant Sulfate (NURTEC) 75 MG TBDP 1 tab by mouth once daily as needed 09/14/23   [name], MD  rizatriptan (MAXALT-MLT) 10 MG disintegrating tablet Take 1 tablet earliest onset of migraine.  May repeat in 2 hours if needed.  Maximum 2 tablets in 24 hours 12/27/22   Drema Dallas, DO  rosuvastatin (CRESTOR) 40 MG tablet 1 tab by mouth once daily 04/21/23   [name], MD  Semaglutide, 1 MG/DOSE, 4 MG/3ML SOPN Inject 1 mg as directed once a week. 09/14/23   [name], MD  sertraline (ZOLOFT) 100 MG tablet Take 1 tablet (100 mg total) by mouth 2 (two) times daily. 04/08/23   [name], MD  sucralfate (CARAFATE) 1 g tablet TAKE 1 TABLET(1 GRAM) BY MOUTH FOUR TIMES DAILY AND AT BEDTIME WITH MEALS 09/20/23   [name], MD  telmisartan-hydrochlorothiazide (MICARDIS HCT) 40-12.5 MG tablet TAKE 1 TABLET BY MOUTH DAILY 09/19/23   [name], MD  triamcinolone (NASACORT) 55 MCG/ACT AERO nasal inhaler Place 2 sprays into the nose daily. 08/17/23   [name], MD  TURMERIC PO Take 1 tablet by mouth daily.    [provider]  vitamin B-12 (CYANOCOBALAMIN)  1000 MCG tablet Take 1,000 mcg by mouth daily.    [provider]  Vitamin D, Ergocalciferol, (DRISDOL) 1.25 MG (50000 UNIT) CAPS capsule Take by mouth. 02/08/20   [provider]  XOPENEX HFA 45 MCG/ACT inhaler INHALE 1 TO 2 PUFFS INOT THE LUNGS EVERY 8 HOURS AS NEEDED FOR WHEEZING OR SHORTNESS OF BREATH, USE 2 PUFFS 3 TIMES DAILY THEN BACK TO HOME  REGIMEN 09/05/23   Leslye Peer, MD  zonisamide (ZONEGRAN) 100 MG capsule TAKE 2 CAPSULES(200 MG) BY MOUTH DAILY 11/08/23   Drema Dallas, DO    Family History Family History  Problem Relation Age of Onset   Allergies Mother    Heart attack Mother    Diabetes Mother    Diabetes Father    Heart disease Father    Rheum arthritis Father    Stroke Father    Hypertension Other    Ovarian cancer Maternal Aunt    Lung cancer Maternal Aunt    Breast cancer Maternal Aunt    Bone cancer Maternal Aunt    Stroke Maternal Uncle    Other Neg Hx     Social History Social History   Tobacco Use   Smoking status: Former    Current packs/day: 0.00    Average packs/day: 1 pack/day for 20.0 years (20.0 ttl pk-yrs)    Types: Cigarettes    Start date: 10/25/1992    Quit date: 10/25/2012    Years since quitting: 11.0   Smokeless tobacco: Never   Tobacco comments:    QUIT 04/2010 AND STARTED BACK 2014 X 3 MONTHS. less than 1 ppd.  started at age 29.    Vaping Use   Vaping status: Never Used  Substance Use Topics   Alcohol use: No   Drug use: No     Allergies   Azithromycin, Bee venom, Flagyl [metronidazole], Penicillins, Shellfish allergy, Klonopin [clonazepam], Cephalexin, and Cephalosporins   Review of Systems Review of Systems  Gastrointestinal:  Positive for diarrhea.  Per HPI   Physical Exam Triage Vital Signs ED Triage Vitals [11/16/23 0930]  Encounter Vitals Group     BP 119/78     Systolic BP Percentile      Diastolic BP Percentile      Pulse Rate 86     Resp 16     Temp 98.1 F (36.7 C)     Temp Source  Oral     SpO2 95 %     Weight      Height      Head Circumference      Peak Flow      Pain Score 0     Pain Loc      Pain Education      Exclude from Growth Chart    No data found.  Updated Vital Signs BP 119/78 (BP Location: Right Arm)   Pulse 86   Temp 98.1 F (36.7 C) (Oral)   Resp 16   LMP 07/18/2011   SpO2 95%   Visual Acuity Right Eye Distance:   Left Eye Distance:   Bilateral Distance:    Right Eye Near:   Left Eye Near:    Bilateral Near:     Physical Exam Vitals and nursing note reviewed.  Constitutional:      Appearance: She is not ill-appearing or toxic-appearing.  HENT:     Head: Normocephalic and atraumatic.     Right Ear: Hearing, tympanic membrane, ear canal and external ear normal.     Left Ear: Hearing, tympanic membrane, ear canal and external ear normal.     Nose: Nose normal.     Mouth/Throat:     Lips: Pink.     Mouth: Mucous membranes are moist. No injury or oral lesions.     Dentition: Normal dentition.     Tongue: No lesions.     Pharynx: Oropharynx is clear. Uvula midline. No pharyngeal  swelling, oropharyngeal exudate, posterior oropharyngeal erythema, uvula swelling or postnasal drip.     Tonsils: No tonsillar exudate.  Eyes:     General: Lids are normal. Vision grossly intact. Gaze aligned appropriately.     Extraocular Movements: Extraocular movements intact.     Conjunctiva/sclera: Conjunctivae normal.  Neck:     Trachea: Trachea and phonation normal.  Cardiovascular:     Rate and Rhythm: Normal rate and regular rhythm.     Heart sounds: Normal heart sounds, S1 normal and S2 normal.  Pulmonary:     Effort: Pulmonary effort is normal. No respiratory distress.     Breath sounds: Normal breath sounds and air entry.  Abdominal:     General: Abdomen is flat. Bowel sounds are normal.     Palpations: Abdomen is soft.     Tenderness: There is no abdominal tenderness. There is no right CVA tenderness, left CVA tenderness or guarding.      Comments: Benign abdominal exam.  Musculoskeletal:     Cervical back: Neck supple.  Lymphadenopathy:     Cervical: No cervical adenopathy.  Skin:    General: Skin is warm and dry.     Capillary Refill: Capillary refill takes less than 2 seconds.     Findings: No rash.  Neurological:     General: No focal deficit present.     Mental Status: She is alert and oriented to person, place, and time. Mental status is at baseline.     Cranial Nerves: No dysarthria or facial asymmetry.  Psychiatric:        Mood and Affect: Mood normal.        Speech: Speech normal.        Behavior: Behavior normal.        Thought Content: Thought content normal.        Judgment: Judgment normal.      UC Treatments / Results  Labs (all labs ordered are listed, but only abnormal results are displayed) Labs Reviewed - No data to display  EKG   Radiology No results found.  Procedures Procedures (including critical care time)  Medications Ordered in UC Medications - No data to display  Initial Impression / Assessment and Plan / UC Course  I have reviewed the triage vital signs and the nursing notes.  Pertinent labs & imaging results that were available during my care of the patient were reviewed by me and considered in my medical decision making (see chart for details).   1. Gastroenteritis, N/V/D Evaluation suggests viral gastrointestinal illness etiology.  Patient nontoxic appearing with hemodynamically stable vital signs, abdominal exam without peritoneal signs/focal tenderness. Will manage this with antiemetic (Zofran) as needed, OTC medicines as needed for discomfort/pain, increased fluids, and rest. Liquid/bland diet initially, then increase diet as tolerated.  She is a diabetic, advised to check sugars and watch for hypoglycemia.  If diarrhea remains persistent for the next 2-3 days, return for blood work to assess for hypokalemia. She takes potassium daily currently and does not  appear to be ill-appearing/toxic appearing on exam. Appears to be hydrated on exam.  Has home supply of Zofran and declines refill of this med today.  Counseled patient on potential for adverse effects with medications prescribed/recommended today, strict ER and return-to-clinic precautions discussed, patient verbalized understanding.    Final Clinical Impressions(s) / UC Diagnoses   Final diagnoses:  Gastroenteritis  Nausea vomiting and diarrhea     Discharge Instructions      Your evaluation suggests that your  symptoms are most likely due to viral stomach illness (gastroenteritis/"stomach bug") which will improve on its own with rest and fluids in the next few days.   Take zofran to help with nausea every 8 hours as needed. You may use over the counter medicines for aches and pains such as tylenol as needed.  Start sipping on liquids (broth, water, gatorade, etc). If you are able to keep liquids down without vomiting for 1-2 hours, you may eat bland foods like jello, pudding, applesauce, bananas, rice, and white toast. Once you can tolerate blands, you may return to normal diet.   Pedialyte or gatorolyte may help to prevent/fix dehydration due to vomiting and diarrhea.  Please follow up with your primary care provider for further management. Return if you experience worsening or uncontrolled pain, inability to tolerate fluids by mouth, difficulty breathing, fevers 100.43F or greater, recurrent vomiting, or any other concerning symptoms.     ED Prescriptions   None    PDMP not reviewed this encounter.   Carlisle Beers, [place] 11/16/23 1008

## 2023-11-17 ENCOUNTER — Encounter: Payer: Self-pay | Admitting: Internal Medicine

## 2023-11-17 ENCOUNTER — Other Ambulatory Visit: Payer: Self-pay | Admitting: Internal Medicine

## 2023-11-17 MED ORDER — ALPRAZOLAM 0.5 MG PO TABS: 0.5000 mg | ORAL_TABLET | Freq: Two times a day (BID) | ORAL | 2 refills | Status: DC | PRN

## 2023-11-17 NOTE — Addendum Note (Signed)
Addended by: [name] on: 11/17/2023 04:28 PM   Modules accepted: Orders

## 2023-11-17 NOTE — Telephone Encounter (Signed)
Ok done erx 

## 2023-11-21 ENCOUNTER — Ambulatory Visit: Payer: Medicaid Other

## 2023-11-22 ENCOUNTER — Telehealth: Payer: Medicaid Other

## 2023-11-22 ENCOUNTER — Encounter (HOSPITAL_BASED_OUTPATIENT_CLINIC_OR_DEPARTMENT_OTHER): Payer: Self-pay

## 2023-11-22 ENCOUNTER — Emergency Department (HOSPITAL_BASED_OUTPATIENT_CLINIC_OR_DEPARTMENT_OTHER): Payer: Medicaid Other

## 2023-11-22 ENCOUNTER — Emergency Department (HOSPITAL_BASED_OUTPATIENT_CLINIC_OR_DEPARTMENT_OTHER)
Admission: EM | Admit: 2023-11-22 | Discharge: 2023-11-22 | Disposition: A | Discharge status: Home / Self Care | Payer: Medicaid Other | Attending: Emergency Medicine | Admitting: Emergency Medicine

## 2023-11-22 ENCOUNTER — Other Ambulatory Visit: Payer: Self-pay

## 2023-11-22 DIAGNOSIS — I10 Essential (primary) hypertension: Secondary | ICD-10-CM | POA: Insufficient documentation

## 2023-11-22 DIAGNOSIS — Z7951 Long term (current) use of inhaled steroids: Secondary | ICD-10-CM | POA: Diagnosis not present

## 2023-11-22 DIAGNOSIS — Z20822 Contact with and (suspected) exposure to covid-19: Secondary | ICD-10-CM | POA: Insufficient documentation

## 2023-11-22 DIAGNOSIS — J449 Chronic obstructive pulmonary disease, unspecified: Secondary | ICD-10-CM | POA: Diagnosis not present

## 2023-11-22 DIAGNOSIS — R197 Diarrhea, unspecified: Secondary | ICD-10-CM | POA: Diagnosis not present

## 2023-11-22 DIAGNOSIS — E119 Type 2 diabetes mellitus without complications: Secondary | ICD-10-CM | POA: Diagnosis not present

## 2023-11-22 DIAGNOSIS — Z79899 Other long term (current) drug therapy: Secondary | ICD-10-CM | POA: Insufficient documentation

## 2023-11-22 DIAGNOSIS — Z794 Long term (current) use of insulin: Secondary | ICD-10-CM | POA: Insufficient documentation

## 2023-11-22 DIAGNOSIS — R112 Nausea with vomiting, unspecified: Secondary | ICD-10-CM | POA: Insufficient documentation

## 2023-11-22 DIAGNOSIS — R109 Unspecified abdominal pain: Secondary | ICD-10-CM | POA: Diagnosis present

## 2023-11-22 DIAGNOSIS — R1084 Generalized abdominal pain: Secondary | ICD-10-CM | POA: Diagnosis not present

## 2023-11-22 DIAGNOSIS — K439 Ventral hernia without obstruction or gangrene: Secondary | ICD-10-CM | POA: Diagnosis not present

## 2023-11-22 DIAGNOSIS — R1032 Left lower quadrant pain: Secondary | ICD-10-CM | POA: Diagnosis not present

## 2023-11-22 DIAGNOSIS — K429 Umbilical hernia without obstruction or gangrene: Secondary | ICD-10-CM | POA: Diagnosis not present

## 2023-11-22 LAB — URINALYSIS, MICROSCOPIC (REFLEX): WBC, UA: NONE SEEN WBC/hpf (ref 0–5)

## 2023-11-22 LAB — URINALYSIS, ROUTINE W REFLEX MICROSCOPIC
Bilirubin Urine: NEGATIVE
Glucose, UA: NEGATIVE mg/dL
Ketones, ur: NEGATIVE mg/dL
Leukocytes,Ua: NEGATIVE
Nitrite: NEGATIVE
Protein, ur: NEGATIVE mg/dL
Specific Gravity, Urine: 1.01 (ref 1.005–1.030)
pH: 5.5 (ref 5.0–8.0)

## 2023-11-22 LAB — CBC
HCT: 38.6 % (ref 36.0–46.0)
Hemoglobin: 13.3 g/dL (ref 12.0–15.0)
MCH: 29.3 pg (ref 26.0–34.0)
MCHC: 34.5 g/dL (ref 30.0–36.0)
MCV: 85 fL (ref 80.0–100.0)
Platelets: 260 10*3/uL (ref 150–400)
RBC: 4.54 MIL/uL (ref 3.87–5.11)
RDW: 13.9 % (ref 11.5–15.5)
WBC: 7.3 10*3/uL (ref 4.0–10.5)
nRBC: 0 % (ref 0.0–0.2)

## 2023-11-22 LAB — COMPREHENSIVE METABOLIC PANEL
ALT: 12 U/L (ref 0–44)
AST: 13 U/L — ABNORMAL LOW (ref 15–41)
Albumin: 3.7 g/dL (ref 3.5–5.0)
Alkaline Phosphatase: 82 U/L (ref 38–126)
Anion gap: 7 (ref 5–15)
BUN: 9 mg/dL (ref 6–20)
CO2: 26 mmol/L (ref 22–32)
Calcium: 9.5 mg/dL (ref 8.9–10.3)
Chloride: 106 mmol/L (ref 98–111)
Creatinine, Ser: 0.67 mg/dL (ref 0.44–1.00)
GFR, Estimated: >60 mL/min (ref >60)
Glucose, Bld: 99 mg/dL (ref 70–99)
Potassium: 3.7 mmol/L (ref 3.5–5.1)
Sodium: 139 mmol/L (ref 135–145)
Total Bilirubin: 0.7 mg/dL (ref 0.0–1.2)
Total Protein: 7.2 g/dL (ref 6.5–8.1)

## 2023-11-22 LAB — RESP PANEL BY RT-PCR (RSV, FLU A&B, COVID)  RVPGX2
Influenza A by PCR: NEGATIVE
Influenza B by PCR: NEGATIVE
Resp Syncytial Virus by PCR: NEGATIVE
SARS Coronavirus 2 by RT PCR: NEGATIVE

## 2023-11-22 LAB — LIPASE, BLOOD: Lipase: 31 U/L (ref 11–51)

## 2023-11-22 MED ORDER — IOHEXOL 300 MG/ML  SOLN
100.0000 mL | Freq: Once | INTRAMUSCULAR | Status: AC | PRN
  Administered 2023-11-22: 100 mL via INTRAVENOUS

## 2023-11-22 MED ORDER — SODIUM CHLORIDE 0.9 % IV BOLUS
1000.0000 mL | Freq: Once | INTRAVENOUS | Status: AC
  Administered 2023-11-22: 1000 mL via INTRAVENOUS

## 2023-11-22 MED ORDER — ONDANSETRON HCL 4 MG/2ML IJ SOLN
4.0000 mg | Freq: Once | INTRAMUSCULAR | Status: AC
  Administered 2023-11-22: 4 mg via INTRAVENOUS
  Filled 2023-11-22: qty 2

## 2023-11-22 MED ORDER — KETOROLAC TROMETHAMINE 30 MG/ML IJ SOLN
30.0000 mg | Freq: Once | INTRAMUSCULAR | Status: AC
  Administered 2023-11-22: 30 mg via INTRAVENOUS
  Filled 2023-11-22: qty 1

## 2023-11-22 MED ORDER — IOHEXOL 350 MG/ML SOLN: 100.0000 mL | Freq: Once | INTRAVENOUS | Status: DC | PRN

## 2023-11-22 MED ORDER — ONDANSETRON 4 MG PO TBDP: 4.0000 mg | ORAL_TABLET | Freq: Three times a day (TID) | ORAL | 0 refills | Status: DC | PRN

## 2023-11-22 NOTE — ED Provider Notes (Signed)
[place] [facility] AT [facility] Provider Note   CSN: [id number] Arrival date & time: 11/22/23  0630     History  Chief Complaint  Patient presents with   Abdominal Pain    Nicole Melendez is a 48 y.o. female.  Pt is a 48 yo female with pmhx significant for obesity, pud, gerd, migraines, hld, depression, sarcoidosis, dm2, htn, copd, arthritis, and arthritis.  She presents to the ED today with abd pain and n/v/d.  She felt like she's been sick for 10 days.  She just wants to feel better so she can get back to work.  No fevers, but she's been taking tylenol daily.         Home Medications Prior to Admission medications   Medication Sig Start Date End Date Taking? Authorizing Provider  ondansetron (ZOFRAN-ODT) 4 MG disintegrating tablet Take 1 tablet (4 mg total) by mouth every 8 (eight) hours as needed. 11/22/23  Yes Jacalyn Lefevre, MD  acetaminophen (TYLENOL) 650 MG CR tablet Take 650-1,300 mg by mouth every 8 (eight) hours as needed for pain.    [provider]  albuterol (VENTOLIN HFA) 108 (90 Base) MCG/ACT inhaler INHALE 2 PUFFS INTO THE LUNGS EVERY 4 HOURS AS NEEDED FOR WHEEZING OR SHORTNESS OF BREATH 03/31/20   Byrum, Les Pou, MD  ALPRAZolam Prudy Feeler) 0.5 MG tablet Take 1 tablet (0.5 mg total) by mouth 2 (two) times daily as needed for anxiety. 11/17/23   [name], MD  azelastine (ASTELIN) 0.1 % nasal spray Place 2 sprays into both nostrils 2 (two) times daily. Use in each nostril as directed 04/01/21   [name], MD  baclofen (LIORESAL) 10 MG tablet Take 1 tablet (10 mg total) by mouth 3 (three) times daily. 09/14/23   [name], MD  BD PEN NEEDLE NANO 2ND GEN 32G X 4 MM MISC USE AS DIRECTED THREE TIMES DAILY 03/17/21   [name], MD  Blood Glucose Monitoring Suppl [facility] VERIO) w/Device KIT Use as directed daily E11.9 03/25/23   [name], MD  clindamycin (CLEOCIN) 150 MG capsule Take 150 mg by mouth 4 (four) times daily.  01/10/23   [provider]  fluticasone (FLONASE) 50 MCG/ACT nasal spray Place 2 sprays into both nostrils daily. 05/18/23   Eustace Moore, MD  gabapentin (NEURONTIN) 300 MG capsule 3 tab by mouth three times daily 04/20/23   [name], MD  glucose blood [facility] VERIO) test strip USE AS DIRECTED THREE TIMES DAILY E11.9 03/22/23   [name], MD  insulin aspart (NOVOLOG FLEXPEN) 100 UNIT/ML FlexPen ADMINISTER 12 UNITS UNDER THE SKIN FOUR TIMES DAILY 03/22/23   [name], MD  levofloxacin (LEVAQUIN) 500 MG tablet Take 1 tablet (500 mg total) by mouth daily. 09/14/23   [name], MD  mometasone-formoterol [facility]) 200-5 MCG/ACT AERO Inhale 2 puffs into the lungs in the morning and at bedtime. 05/11/23   Leslye Peer, MD  ondansetron [facility]) 4 MG/5ML solution Take 5 mLs (4 mg total) by mouth every 8 (eight) hours as needed for nausea or vomiting. 06/01/23   Jacalyn Lefevre, MD  ondansetron (ZOFRAN-ODT) 4 MG disintegrating tablet Take 1 tablet (4 mg total) by mouth every 8 (eight) hours as needed. 07/26/23   Ernie Avena, MD  oxyCODONE-acetaminophen (PERCOCET) 10-325 MG tablet Take 1 tablet by mouth every 6 (six) hours as needed for pain. Per pain management 09/14/23 09/13/24  [name], MD  pantoprazole (PROTONIX) 40 MG tablet Take 1 tablet (40 mg total) by mouth 2 (two) times daily before a meal. 08/17/23 08/16/24  [name], MD  Plecanatide (TRULANCE) 3 MG TABS 1 tab by mouth once daily 03/22/23   [name], MD  Rimegepant Sulfate (NURTEC) 75 MG TBDP 1 tab by mouth once daily as needed 09/14/23   [name], MD  rizatriptan (MAXALT-MLT) 10 MG disintegrating tablet Take 1 tablet earliest onset of migraine.  May repeat in 2 hours if needed.  Maximum 2 tablets in 24 hours 12/27/22   Drema Dallas, DO  rosuvastatin (CRESTOR) 40 MG tablet 1 tab by mouth once daily 04/21/23   [name], MD  Semaglutide, 1 MG/DOSE, 4 MG/3ML SOPN Inject 1 mg as directed once a week.  09/14/23   [name], MD  sertraline (ZOLOFT) 100 MG tablet Take 1 tablet (100 mg total) by mouth 2 (two) times daily. 04/08/23   [name], MD  sucralfate (CARAFATE) 1 g tablet TAKE 1 TABLET(1 GRAM) BY MOUTH FOUR TIMES DAILY AND AT BEDTIME WITH MEALS 09/20/23   [name], MD  telmisartan-hydrochlorothiazide (MICARDIS HCT) 40-12.5 MG tablet TAKE 1 TABLET BY MOUTH DAILY 09/19/23   [name], MD  triamcinolone (NASACORT) 55 MCG/ACT AERO nasal inhaler Place 2 sprays into the nose daily. 08/17/23   [name], MD  TURMERIC PO Take 1 tablet by mouth daily.    [provider]  vitamin B-12 (CYANOCOBALAMIN) 1000 MCG tablet Take 1,000 mcg by mouth daily.    [provider]  Vitamin D, Ergocalciferol, (DRISDOL) 1.25 MG (50000 UNIT) CAPS capsule Take by mouth. 02/08/20   [provider]  XOPENEX HFA 45 MCG/ACT inhaler INHALE 1 TO 2 PUFFS INOT THE LUNGS EVERY 8 HOURS AS NEEDED FOR WHEEZING OR SHORTNESS OF BREATH, USE 2 PUFFS 3 TIMES DAILY THEN BACK TO HOME REGIMEN 09/05/23   Leslye Peer, MD  zonisamide (ZONEGRAN) 100 MG capsule TAKE 2 CAPSULES(200 MG) BY MOUTH DAILY 11/08/23   Drema Dallas, DO      Allergies    Azithromycin, Bee venom, Flagyl [metronidazole], Penicillins, Shellfish allergy, Klonopin [clonazepam], Cephalexin, and Cephalosporins    Review of Systems   Review of Systems  Gastrointestinal:  Positive for abdominal pain, nausea and vomiting.  All other systems reviewed and are negative.   Physical Exam Updated Vital Signs BP (!) 127/105   Pulse 77   Temp (!) 97 F (36.1 C)   Resp 17   Ht 5\' 7"  (1.702 m)   Wt 117.9 kg   LMP 07/18/2011   SpO2 99%   BMI 40.72 kg/m  Physical Exam Vitals and nursing note reviewed.  Constitutional:      Appearance: She is well-developed.  HENT:     Head: Normocephalic and atraumatic.     Mouth/Throat:     Mouth: Mucous membranes are dry.     Pharynx: Oropharynx is clear.  Eyes:     Extraocular  Movements: Extraocular movements intact.     Pupils: Pupils are equal, round, and reactive to light.  Cardiovascular:     Rate and Rhythm: Normal rate and regular rhythm.     Heart sounds: Normal heart sounds.  Pulmonary:     Effort: Pulmonary effort is normal.     Breath sounds: Normal breath sounds.  Abdominal:     General: Abdomen is flat. Bowel sounds are normal.     Palpations: Abdomen is soft.  Tenderness: There is abdominal tenderness in the left lower quadrant.  Skin:    General: Skin is warm.     Capillary Refill: Capillary refill takes less than 2 seconds.  Neurological:     General: No focal deficit present.     Mental Status: She is alert and oriented to person, place, and time.  Psychiatric:        Mood and Affect: Mood normal.        Behavior: Behavior normal.     ED Results / Procedures / Treatments   Labs (all labs ordered are listed, but only abnormal results are displayed) Labs Reviewed  COMPREHENSIVE METABOLIC PANEL - Abnormal; Notable for the following components:      Result Value   AST 13 (*)    All other components within normal limits  URINALYSIS, ROUTINE W REFLEX MICROSCOPIC - Abnormal; Notable for the following components:   Hgb urine dipstick TRACE (*)    All other components within normal limits  URINALYSIS, MICROSCOPIC (REFLEX) - Abnormal; Notable for the following components:   Bacteria, UA RARE (*)    All other components within normal limits  RESP PANEL BY RT-PCR (RSV, FLU A&B, COVID)  [id number]  GASTROINTESTINAL PANEL BY PCR, STOOL (REPLACES STOOL CULTURE)  LIPASE, BLOOD  CBC    EKG None  Radiology CT ABDOMEN PELVIS W CONTRAST Result Date: 11/22/2023 CLINICAL DATA:  Left lower quadrant abdominal pain. Nausea and vomiting. EXAM: CT ABDOMEN AND PELVIS WITH CONTRAST TECHNIQUE: Multidetector CT imaging of the abdomen and pelvis was performed using the standard protocol following bolus administration of intravenous contrast. RADIATION DOSE  REDUCTION: This exam was performed according to the departmental dose-optimization program which includes automated exposure control, adjustment of the mA and/or kV according to patient size and/or use of iterative reconstruction technique. CONTRAST:  100 mL Omnipaque 300 COMPARISON:  07/26/2023 FINDINGS: Lower chest: Lung bases are clear. Hepatobiliary: Normal appearance of the liver, gallbladder and portal venous system. No biliary dilatation. Pancreas: Unremarkable. No pancreatic ductal dilatation or surrounding inflammatory changes. Spleen: Normal in size without focal abnormality. Adrenals/Urinary Tract: Normal adrenal glands. Normal appearance of both kidneys without hydronephrosis. No suspicious renal lesions. Normal appearance of the urinary bladder. Stomach/Bowel: Postsurgical changes involving the stomach. Small surgical clips scattered throughout the upper abdomen. No bowel dilatation or obstruction. Normal appendix. Vascular/Lymphatic: No significant vascular findings are present. No enlarged abdominal or pelvic lymph nodes. Reproductive: Status post hysterectomy. No adnexal masses. Other: Trace fluid in the pelvis is likely physiologic. Umbilical hernia containing fat. Negative for free air. Small ventral hernia containing fat just above the umbilicus. Musculoskeletal: Degenerative facet disease in the lumbar spine. No acute bone abnormality. IMPRESSION: 1. No acute abnormality in the abdomen or pelvis. 2. Trace fluid in the pelvis is likely physiologic. 3. Umbilical and ventral hernias containing fat. Electronically Signed   By: Richarda Overlie M.D.   On: 11/22/2023 11:38    Procedures Procedures    Medications Ordered in ED Medications  iohexol (OMNIPAQUE) 300 MG/ML solution 100 mL (100 mLs Intravenous Contrast Given 11/22/23 1035)  sodium chloride 0.9 % bolus 1,000 mL (1,000 mLs Intravenous New Bag/Given 11/22/23 1224)  ketorolac (TORADOL) 30 MG/ML injection 30 mg (30 mg Intravenous Given 11/22/23  1225)  ondansetron (ZOFRAN) injection 4 mg (4 mg Intravenous Given 11/22/23 1224)    ED Course/ Medical Decision Making/ A&P  Medical Decision Making Amount and/or Complexity of Data Reviewed Labs: ordered.  Risk Prescription drug management.   This patient presents to the ED for concern of abd pain, n/v/d, this involves an extensive number of treatment options, and is a complaint that carries with it a high risk of complications and morbidity.  The differential diagnosis includes uti, diverticulitis, covid/flu/rsv   Co morbidities that complicate the patient evaluation  obesity, pud, gerd, migraines, hld, depression, sarcoidosis, dm2, htn, copd, arthritis, and arthritis   Additional history obtained:  Additional history obtained from epic chart review  Lab Tests:  I Ordered, and personally interpreted labs.  The pertinent results include:  cbc nl, cmp nl, lip nl, covid/flu/rsv neg, ua nl   Imaging Studies ordered:  I ordered imaging studies including ct abd/pelvis  I independently visualized and interpreted imaging which showed  . No acute abnormality in the abdomen or pelvis.  2. Trace fluid in the pelvis is likely physiologic.  3. Umbilical and ventral hernias containing fat.   I agree with the radiologist interpretation   Medicines ordered and prescription drug management:  I ordered medication including ivfs/zofran, toradol  for sx  Reevaluation of the patient after these medicines showed that the patient improved I have reviewed the patients home medicines and have made adjustments as needed   Test Considered:  ct   Critical Interventions:  ivfs   Problem List / ED Course:  Abd pain:  pt is feeling much better after fluids.  Labs and ct nl. Pt is stable for d/c.  Return if worse.  F/u with pcp.   Reevaluation:  After the interventions noted above, I reevaluated the patient and found that they have  :improved   Social Determinants of Health:  Lives at home   Dispostion:  After consideration of the diagnostic results and the patients response to treatment, I feel that the patent would benefit from discharge with outpatient f/u.          Final Clinical Impression(s) / ED Diagnoses Final diagnoses:  Generalized abdominal pain  Nausea vomiting and diarrhea    Rx / DC Orders ED Discharge Orders          Ordered    ondansetron (ZOFRAN-ODT) 4 MG disintegrating tablet  Every 8 hours PRN        11/22/23 1308              Jacalyn Lefevre, MD 11/22/23 1309

## 2023-11-22 NOTE — ED Provider Triage Note (Signed)
Emergency Medicine Provider Triage Evaluation Note  Nicole Melendez , a 49 y.o. female  was evaluated in triage.  Pt complains of N,V,D for past ten days. Was exposed to flu and "diarrheal illness" at work. LUQ and LLQ abd TTP for past three days. Hx of diverticulosis, sarcoid  Review of Systems  Positive: N,V,D, abd TTP Negative: Fevers, CP, SOB  Physical Exam  BP (!) 126/91 (BP Location: Right Arm)   Pulse 92   Temp (!) 97 F (36.1 C)   Resp 18   Ht 5\' 7"  (1.702 m)   Wt 117.9 kg   LMP 07/18/2011   SpO2 99%   BMI 40.72 kg/m  Gen:   Awake, no distress   Resp:  Normal effort  MSK:   Moves extremities without difficulty  Other:  Mild LUQ and LLQ abd TTP  Medical Decision Making  Medically screening exam initiated at 9:41 AM.  Appropriate orders placed.  Nicole Melendez was informed that the remainder of the evaluation will be completed by another provider, this initial triage assessment does not replace that evaluation, and the importance of remaining in the ED until their evaluation is complete.  Lipase neg. CBC and CMP WNL Pending UA, swab, CT, stool culture   Judithann Sheen, PA 11/22/23 [phone]

## 2023-11-22 NOTE — Progress Notes (Signed)
Patient did not connect during time of scheduled video visit. Of note she was in the ED at the time, had already been triaged.

## 2023-11-22 NOTE — ED Provider Triage Note (Signed)
Emergency Medicine Provider Triage Evaluation Note  KEOSHA ROSSA , a 48 y.o. female  was evaluated in triage.  Pt complains of nausea vomiting and diarrhea   Review of Systems  Positive: Vomiting  Negative: Fever   Physical Exam  BP (!) 126/91 (BP Location: Right Arm)   Pulse 92   Temp (!) 97 F (36.1 C)   Resp 18   Ht 5\' 7"  (1.702 m)   Wt 117.9 kg   LMP 07/18/2011   SpO2 99%   BMI 40.72 kg/m  Gen:   Awake, no distress   Resp:  Normal effort  MSK:   Moves extremities without difficulty  Other:   Medical Decision Making  Medically screening exam initiated at 7:27 AM.  Appropriate orders placed.  KELSYE LOOMER was informed that the remainder of the evaluation will be completed by another provider, this initial triage assessment does not replace that evaluation, and the importance of remaining in the ED until their evaluation is complete.     Simonne Boulos, MD 11/22/23 [phone]

## 2023-11-22 NOTE — ED Triage Notes (Signed)
C/o nausea, vomiting, ear ache, diarrhea, left lower abdominal pain x 1 week. Negative for strep & flu.

## 2023-11-28 DIAGNOSIS — F411 Generalized anxiety disorder: Secondary | ICD-10-CM | POA: Diagnosis not present

## 2023-11-28 DIAGNOSIS — F410 Panic disorder [episodic paroxysmal anxiety] without agoraphobia: Secondary | ICD-10-CM | POA: Diagnosis not present

## 2023-11-28 DIAGNOSIS — M7061 Trochanteric bursitis, right hip: Secondary | ICD-10-CM | POA: Diagnosis not present

## 2023-11-28 DIAGNOSIS — E119 Type 2 diabetes mellitus without complications: Secondary | ICD-10-CM | POA: Diagnosis not present

## 2023-11-28 DIAGNOSIS — M5440 Lumbago with sciatica, unspecified side: Secondary | ICD-10-CM | POA: Diagnosis not present

## 2023-11-28 DIAGNOSIS — M539 Dorsopathy, unspecified: Secondary | ICD-10-CM | POA: Diagnosis not present

## 2023-11-28 DIAGNOSIS — Z6841 Body Mass Index (BMI) 40.0 and over, adult: Secondary | ICD-10-CM | POA: Diagnosis not present

## 2023-11-28 DIAGNOSIS — Z79899 Other long term (current) drug therapy: Secondary | ICD-10-CM | POA: Diagnosis not present

## 2023-12-01 ENCOUNTER — Ambulatory Visit
Admission: RE | Admit: 2023-12-01 | Discharge: 2023-12-01 | Disposition: A | Discharge status: Home / Self Care | Payer: Medicaid Other | Source: Ambulatory Visit | Attending: Internal Medicine | Admitting: Internal Medicine

## 2023-12-01 VITALS — BP 118/83 | HR 87 | Temp 98.3°F | Resp 18

## 2023-12-01 DIAGNOSIS — G43E09 Chronic migraine with aura, not intractable, without status migrainosus: Secondary | ICD-10-CM | POA: Diagnosis not present

## 2023-12-01 MED ORDER — KETOROLAC TROMETHAMINE 30 MG/ML IJ SOLN
30.0000 mg | Freq: Once | INTRAMUSCULAR | Status: AC
  Administered 2023-12-01: 30 mg via INTRAMUSCULAR

## 2023-12-01 MED ORDER — ONDANSETRON 4 MG PO TBDP
4.0000 mg | ORAL_TABLET | Freq: Once | ORAL | Status: AC
  Administered 2023-12-01: 4 mg via ORAL

## 2023-12-01 MED ORDER — DEXAMETHASONE SODIUM PHOSPHATE 10 MG/ML IJ SOLN
10.0000 mg | Freq: Once | INTRAMUSCULAR | Status: AC
  Administered 2023-12-01: 10 mg via INTRAMUSCULAR

## 2023-12-01 MED ORDER — METOCLOPRAMIDE HCL 5 MG/ML IJ SOLN
5.0000 mg | Freq: Once | INTRAMUSCULAR | Status: AC
  Administered 2023-12-01: 5 mg via INTRAMUSCULAR

## 2023-12-01 NOTE — ED Provider Notes (Signed)
 [facility]    CSN: [id number] Arrival date & time: 12/01/23  1314      History   Chief Complaint Chief Complaint  Patient presents with   Migraine    Took medicine for the migraines but they won't stop coming. - Entered by patient    HPI Nicole Melendez is a 48 y.o. female.   Patient presents to urgent care for evaluation of migraine headache that started this morning.  Headache is localized to the right side of the head and does not cross midline.  Pain is currently throbbing and spasming and is currently a 9.5 out of 10.  She reports history of chronic migraines and states she typically has about 16 headaches per month.  She has an aura before headaches typically where she smells a certain smell and knows that the headache is going to come on.  Headache woke her up out of her sleep this morning, she took Tylenol , fell back asleep, then woke up with worsening pain.  She takes Maxalt  for abortive therapy and has taken 1 tablet of Maxalt  today without much relief.  She had a little bit of blurry vision associated with headache initially but she is no longer experiencing any vision changes.  Reports intermittent nausea without vomiting.  No abdominal pain, ear pain, recent viral URI symptoms, fever, chills, recent injuries to the head, or dizziness reported.  She has a follow-up appointment with her neurologist next month to discuss migraine management further.  She has not had any ibuprofen  today.   Migraine    Past Medical History:  Diagnosis Date   Allergic rhinitis, cause unspecified    Anemia    Anxiety    Arthritis    COPD (chronic obstructive pulmonary disease) (HCC)    Depression    Diabetes mellitus type II    steroid related, patient states im no longer diabetic   Essential hypertension 08/08/2015   GERD (gastroesophageal reflux disease)    History of blood transfusion    Hyperlipidemia    Menorrhagia    Migraine    Morbid obesity (HCC)    Otitis  media 06/28/2015   Peptic ulcer disease    Pneumonia    Positive ANA (antinuclear antibody) 02/14/2012   Sarcoid    including hand per rheumatology-Dr. Mai   Sarcoidosis of lung (HCC)    Shortness of breath    on exertion   Varicose veins with pain     Patient Active Problem List   Diagnosis Date Noted   Blurred vision, bilateral 09/17/2023   Dyspepsia 08/17/2023   Acute recurrent pancreatitis 08/01/2023   Left ear pain 07/23/2023   OSA (obstructive sleep apnea) 06/17/2023   Constipation 03/23/2023   Nail disorder 12/25/2022   Cellulitis of great toe 12/22/2022   Acute upper respiratory infection 09/06/2022   Vitamin D  deficiency 10/21/2020   Right low back pain 10/15/2020   Recurrent falls while walking 10/15/2020   COVID-19 virus infection 09/28/2020   Exposure to COVID-19 virus 07/10/2020   Viral illness 07/10/2020   Sinusitis 05/23/2020   Allergic conjunctivitis 05/23/2020   Yeast vaginitis 05/23/2020   Polyarthralgia 02/20/2020   Tibialis posterior tendon tear, nontraumatic, right 02/08/2020   Neck pain 12/14/2019   COPD (chronic obstructive pulmonary disease) (HCC) 11/07/2019   Suspected COVID-19 virus infection 08/02/2019   Hematochezia 04/05/2019   Snoring 11/17/2018   Eye symptom 08/03/2018   Right lumbar radiculopathy 05/08/2018   Chronic pain 10/11/2017   Sarcoidosis of lung (HCC)  Acute on chronic diastolic CHF (congestive heart failure) (HCC) 09/22/2017   Rheumatoid arthritis (HCC) 09/18/2017   Chronic diastolic CHF (congestive heart failure) (HCC) 09/18/2017   Pancreatitis 06/26/2017   Right ankle sprain 06/26/2017   Possible exposure to STD 06/15/2017   Diarrhea 04/10/2017   Bilateral hand swelling 04/07/2017   Abdominal pain 04/07/2017   Abnormality of gait 01/26/2017   Avulsion fracture of right ankle 12/01/2016   Ulcer of esophagus with bleeding    Gastritis and gastroduodenitis    Blepharitis of right upper eyelid 10/09/2016   Peroneal  tendinitis of lower leg, right 10/06/2016   Right ankle pain 09/16/2016   Dizziness 03/11/2016   Mixed headache 03/09/2016   Chronic venous insufficiency 02/24/2016   Gastroenteritis 12/30/2015   Headache 12/30/2015   Epistaxis 12/09/2015   Post concussive syndrome 12/03/2015   Cervical muscle strain 12/03/2015   Right-sided face pain 11/26/2015   Multiple contusions 11/26/2015   Neck pain, bilateral posterior 11/26/2015   Low back pain 11/26/2015   Spinal stenosis of lumbar region 10/28/2015   Chronic sciatica of left side 10/28/2015   DOE (dyspnea on exertion) 09/02/2015   Essential hypertension 08/08/2015   Diaphoresis 08/07/2015   Cough 07/02/2015   Vaginitis and vulvovaginitis 06/29/2015   Otitis media 06/28/2015   Thrush 06/24/2015   Nausea & vomiting 06/10/2015   Wheezing 11/14/2014   Angioedema 03/19/2014   Vaginitis 11/19/2012   Smoker 11/19/2012   Positive ANA (antinuclear antibody) 02/14/2012   Polyarthritis 02/11/2012   Migraine 04/05/2011   Encounter for well adult exam with abnormal findings 02/21/2011   Allergic rhinitis 02/17/2011   URTICARIA 09/29/2010   Depression 09/14/2010   Diabetes mellitus with neuropathy (HCC) 07/31/2010   PERIPHERAL EDEMA 07/31/2010   INSOMNIA-SLEEP DISORDER-UNSPEC 06/19/2010   MENORRHAGIA 05/15/2010   Hyperlipidemia 12/17/2009   Iron deficiency anemia 12/17/2009   Anxiety state 12/17/2009   Morbid obesity (HCC) 07/02/2007   Gastroesophageal reflux disease 07/02/2007   PEPTIC ULCER DISEASE 07/02/2007    Past Surgical History:  Procedure Laterality Date   ABDOMINAL HYSTERECTOMY     COLONOSCOPY WITH PROPOFOL  N/A 10/19/2016   Procedure: COLONOSCOPY WITH PROPOFOL ;  Surgeon: Gustav LULLA Mcgee, MD;  Location: [facility];  Service: Endoscopy;  Laterality: N/A;   ESOPHAGOGASTRODUODENOSCOPY (EGD) WITH PROPOFOL  N/A 10/19/2016   Procedure: ESOPHAGOGASTRODUODENOSCOPY (EGD) WITH PROPOFOL ;  Surgeon: Gustav LULLA Mcgee, MD;  Location:  [facility];  Service: Endoscopy;  Laterality: N/A;   HERNIA MESH REMOVAL  02/2013   HERNIA REPAIR     KNEE ARTHROSCOPY Left 01/15/2022   Procedure: ARTHROSCOPY KNEE;  Surgeon: Kay Kemps, MD;  Location: [facility];  Service: Orthopedics;  Laterality: Left;   uterine ablation  03/2010   WISDOM TOOTH EXTRACTION      OB History   No obstetric history on file.      Home Medications    Prior to Admission medications   Medication Sig Start Date End Date Taking? Authorizing Provider  acetaminophen  (TYLENOL ) 650 MG CR tablet Take 650-1,300 mg by mouth every 8 (eight) hours as needed for pain.    [provider]  albuterol  (VENTOLIN  HFA) 108 (90 Base) MCG/ACT inhaler INHALE 2 PUFFS INTO THE LUNGS EVERY 4 HOURS AS NEEDED FOR WHEEZING OR SHORTNESS OF BREATH 03/31/20   Shelah Lamar RAMAN, MD  ALPRAZolam  (XANAX ) 0.5 MG tablet Take 1 tablet (0.5 mg total) by mouth 2 (two) times daily as needed for anxiety. 11/17/23   Norleen Lynwood ORN, MD  azelastine  (ASTELIN ) 0.1 % nasal  spray Place 2 sprays into both nostrils 2 (two) times daily. Use in each nostril as directed 04/01/21   Norleen Lynwood ORN, MD  baclofen  (LIORESAL ) 10 MG tablet Take 1 tablet (10 mg total) by mouth 3 (three) times daily. 09/14/23   Norleen Lynwood ORN, MD  BD PEN NEEDLE NANO 2ND GEN 32G X 4 MM MISC USE AS DIRECTED THREE TIMES DAILY 03/17/21   Norleen Lynwood ORN, MD  Blood Glucose Monitoring Suppl [facility] VERIO) w/Device KIT Use as directed daily E11.9 03/25/23   Norleen Lynwood ORN, MD  clindamycin  (CLEOCIN ) 150 MG capsule Take 150 mg by mouth 4 (four) times daily. 01/10/23   [provider]  fluticasone  (FLONASE ) 50 MCG/ACT nasal spray Place 2 sprays into both nostrils daily. 05/18/23   Maranda Jamee Jacob, MD  gabapentin  (NEURONTIN ) 300 MG capsule 3 tab by mouth three times daily 04/20/23   Norleen Lynwood ORN, MD  glucose blood [facility] VERIO) test strip USE AS DIRECTED THREE TIMES DAILY E11.9 03/22/23   Norleen Lynwood ORN, MD  insulin  aspart (NOVOLOG  FLEXPEN)  100 UNIT/ML FlexPen ADMINISTER 12 UNITS UNDER THE SKIN FOUR TIMES DAILY 03/22/23   Norleen Lynwood ORN, MD  levofloxacin  (LEVAQUIN ) 500 MG tablet Take 1 tablet (500 mg total) by mouth daily. 09/14/23   Norleen Lynwood ORN, MD  mometasone -formoterol  (DULERA ) 200-5 MCG/ACT AERO Inhale 2 puffs into the lungs in the morning and at bedtime. 05/11/23   Shelah Lamar RAMAN, MD  ondansetron  (ZOFRAN ) 4 MG/5ML solution Take 5 mLs (4 mg total) by mouth every 8 (eight) hours as needed for nausea or vomiting. 06/01/23   Dean Clarity, MD  ondansetron  (ZOFRAN -ODT) 4 MG disintegrating tablet Take 1 tablet (4 mg total) by mouth every 8 (eight) hours as needed. 07/26/23   Jerrol Lynwood, MD  ondansetron  (ZOFRAN -ODT) 4 MG disintegrating tablet Take 1 tablet (4 mg total) by mouth every 8 (eight) hours as needed. 11/22/23   Dean Clarity, MD  oxyCODONE -acetaminophen  (PERCOCET) 10-325 MG tablet Take 1 tablet by mouth every 6 (six) hours as needed for pain. Per pain management 09/14/23 09/13/24  Norleen Lynwood ORN, MD  pantoprazole  (PROTONIX ) 40 MG tablet Take 1 tablet (40 mg total) by mouth 2 (two) times daily before a meal. 08/17/23 08/16/24  Norleen Lynwood ORN, MD  Plecanatide  (TRULANCE ) 3 MG TABS 1 tab by mouth once daily 03/22/23   John, James W, MD  Rimegepant Sulfate  (NURTEC) 75 MG TBDP 1 tab by mouth once daily as needed 09/14/23   Norleen Lynwood ORN, MD  rizatriptan  (MAXALT -MLT) 10 MG disintegrating tablet Take 1 tablet earliest onset of migraine.  May repeat in 2 hours if needed.  Maximum 2 tablets in 24 hours 12/27/22   Skeet Juliene SAUNDERS, DO  rosuvastatin  (CRESTOR ) 40 MG tablet 1 tab by mouth once daily 04/21/23   Norleen Lynwood ORN, MD  Semaglutide , 1 MG/DOSE, 4 MG/3ML SOPN Inject 1 mg as directed once a week. 09/14/23   Norleen Lynwood ORN, MD  sertraline  (ZOLOFT ) 100 MG tablet Take 1 tablet (100 mg total) by mouth 2 (two) times daily. 04/08/23   Norleen Lynwood ORN, MD  sucralfate  (CARAFATE ) 1 g tablet TAKE 1 TABLET(1 GRAM) BY MOUTH FOUR TIMES DAILY AND AT BEDTIME  WITH MEALS 09/20/23   Norleen Lynwood ORN, MD  telmisartan -hydrochlorothiazide  (MICARDIS  HCT) 40-12.5 MG tablet TAKE 1 TABLET BY MOUTH DAILY 09/19/23   Norleen Lynwood ORN, MD  triamcinolone  (NASACORT ) 55 MCG/ACT AERO nasal inhaler Place 2 sprays into the nose daily. 08/17/23  Norleen Lynwood ORN, MD  TURMERIC PO Take 1 tablet by mouth daily.    [provider]  vitamin B-12 (CYANOCOBALAMIN ) 1000 MCG tablet Take 1,000 mcg by mouth daily.    [provider]  Vitamin D , Ergocalciferol , (DRISDOL ) 1.25 MG (50000 UNIT) CAPS capsule Take by mouth. 02/08/20   [provider]  XOPENEX  HFA 45 MCG/ACT inhaler INHALE 1 TO 2 PUFFS INOT THE LUNGS EVERY 8 HOURS AS NEEDED FOR WHEEZING OR SHORTNESS OF BREATH, USE 2 PUFFS 3 TIMES DAILY THEN BACK TO HOME REGIMEN 09/05/23   Shelah Lamar RAMAN, MD  zonisamide  (ZONEGRAN ) 100 MG capsule TAKE 2 CAPSULES(200 MG) BY MOUTH DAILY 11/08/23   Skeet Juliene SAUNDERS, DO    Family History Family History  Problem Relation Age of Onset   Allergies Mother    Heart attack Mother    Diabetes Mother    Diabetes Father    Heart disease Father    Rheum arthritis Father    Stroke Father    Hypertension Other    Ovarian cancer Maternal Aunt    Lung cancer Maternal Aunt    Breast cancer Maternal Aunt    Bone cancer Maternal Aunt    Stroke Maternal Uncle    Other Neg Hx     Social History Social History   Tobacco Use   Smoking status: Former    Current packs/day: 0.00    Average packs/day: 1 pack/day for 20.0 years (20.0 ttl pk-yrs)    Types: Cigarettes    Start date: 10/25/1992    Quit date: 10/25/2012    Years since quitting: 11.1   Smokeless tobacco: Never   Tobacco comments:    QUIT 04/2010 AND STARTED BACK 2014 X 3 MONTHS. less than 1 ppd.  started at age 53.    Vaping Use   Vaping status: Never Used  Substance Use Topics   Alcohol use: No   Drug use: No     Allergies   Azithromycin, Bee venom, Flagyl  [metronidazole ], Penicillins, Shellfish allergy, Klonopin   [clonazepam ], Cephalexin, and Cephalosporins   Review of Systems Review of Systems Per HPI  Physical Exam Triage Vital Signs ED Triage Vitals  Encounter Vitals Group     BP 12/01/23 1453 118/83     Systolic BP Percentile --      Diastolic BP Percentile --      Pulse Rate 12/01/23 1453 87     Resp 12/01/23 1453 18     Temp 12/01/23 1453 98.3 F (36.8 C)     Temp Source 12/01/23 1453 Oral     SpO2 12/01/23 1453 96 %     Weight --      Height --      Head Circumference --      Peak Flow --      Pain Score 12/01/23 1452 9     Pain Loc --      Pain Education --      Exclude from Growth Chart --    No data found.  Updated Vital Signs BP 118/83 (BP Location: Right Arm)   Pulse 87   Temp 98.3 F (36.8 C) (Oral)   Resp 18   LMP 07/18/2011   SpO2 96%   Visual Acuity Right Eye Distance:   Left Eye Distance:   Bilateral Distance:    Right Eye Near:   Left Eye Near:    Bilateral Near:     Physical Exam Vitals and nursing note reviewed.  Constitutional:  Appearance: She is not ill-appearing or toxic-appearing.  HENT:     Head: Normocephalic and atraumatic.     Right Ear: Hearing, tympanic membrane, ear canal and external ear normal.     Left Ear: Hearing, tympanic membrane, ear canal and external ear normal.     Nose: Nose normal.     Mouth/Throat:     Lips: Pink.     Mouth: Mucous membranes are moist. No injury or oral lesions.     Dentition: Normal dentition.     Tongue: No lesions.     Pharynx: Oropharynx is clear. Uvula midline. No pharyngeal swelling, oropharyngeal exudate, posterior oropharyngeal erythema, uvula swelling or postnasal drip.     Tonsils: No tonsillar exudate.  Eyes:     General: Lids are normal. Vision grossly intact. Gaze aligned appropriately.     Extraocular Movements: Extraocular movements intact.     Conjunctiva/sclera: Conjunctivae normal.  Neck:     Trachea: Trachea and phonation normal.  Pulmonary:     Effort: Pulmonary effort  is normal.  Musculoskeletal:     Cervical back: Neck supple.  Lymphadenopathy:     Cervical: No cervical adenopathy.  Skin:    General: Skin is warm and dry.     Capillary Refill: Capillary refill takes less than 2 seconds.     Findings: No rash.  Neurological:     General: No focal deficit present.     Mental Status: She is alert and oriented to person, place, and time. Mental status is at baseline.     GCS: GCS eye subscore is 4. GCS verbal subscore is 5. GCS motor subscore is 6.     Cranial Nerves: Cranial nerves 2-12 are intact. No dysarthria or facial asymmetry.     Sensory: Sensation is intact.     Motor: Motor function is intact. No weakness, tremor, abnormal muscle tone or pronator drift.     Coordination: Coordination is intact. Romberg sign negative. Coordination normal. Finger-Nose-Finger Test normal.     Gait: Gait is intact.     Comments: Strength and sensation intact to bilateral upper and lower extremities (5/5). Moves all 4 extremities with normal coordination voluntarily. Non-focal neuro exam.   Psychiatric:        Mood and Affect: Mood normal.        Speech: Speech normal.        Behavior: Behavior normal.        Thought Content: Thought content normal.        Judgment: Judgment normal.      UC Treatments / Results  Labs (all labs ordered are listed, but only abnormal results are displayed) Labs Reviewed - No data to display  EKG   Radiology No results found.  Procedures Procedures (including critical care time)  Medications Ordered in UC Medications  ketorolac  (TORADOL ) 30 MG/ML injection 30 mg (30 mg Intramuscular Given 12/01/23 1556)  dexamethasone  (DECADRON ) injection 10 mg (10 mg Intramuscular Given 12/01/23 1556)  metoCLOPramide  (REGLAN ) injection 5 mg (5 mg Intramuscular Given 12/01/23 1556)  ondansetron  (ZOFRAN -ODT) disintegrating tablet 4 mg (4 mg Oral Given 12/01/23 1556)    Initial Impression / Assessment and Plan / UC Course  I have reviewed  the triage vital signs and the nursing notes.  Pertinent labs & imaging results that were available during my care of the patient were reviewed by me and considered in my medical decision making (see chart for details).   1.  Chronic migraine with aura without status migrainosus, not  intractable Evaluation suggests migraine type headache.   Neurologic exam without focal deficit, patient intact to baseline.  Low suspicion for acute intracranial abnormality.   Nursing administered ketorolac , dexamethasone , and Reglan  in clinic for headache with significant relief.  Headache fully resolved prior to discharge after 15 to 20 minutes of monitoring in clinic.  May use OTC medications as needed for further head pain relief.   Encouraged to drink plenty of fluids to stay well hydrated. Advised to call neurologist to get on a cancellation list to be seen sooner due to increasing frequency of migraine headaches.  Counseled patient on potential for adverse effects with medications prescribed/recommended today, strict ER and return-to-clinic precautions discussed, patient verbalized understanding.    Final Clinical Impressions(s) / UC Diagnoses   Final diagnoses:  Chronic migraine with aura without status migrainosus, not intractable     Discharge Instructions      You have been evaluated today for headache.  You were given medicines for your headache in the clinic today which included a strong NSAID, so do not  take ibuprofen  or other NSAIDS (Aleve , aspirin , naproxen , ibuprofen , goody powder, etc.) for the next 12 hours.  Starting tomorrow, take 600mg  ibuprofen  every 6 hours or tylenol  1,000 every 6 hours as needed for pain.  Avoid areas of loud noise/harsh light and remember to drink plenty of water to stay well hydrated.  Please follow up with your primary care provider for further management of your headaches.  Please seek emergency medical care if you experience worsening or  uncontrolled pain, vision changes, recurrent vomiting, difficulty with normal activities, abnormal behavior, difficulty walking, numbness, weakness, or any other concerning symptoms.    ED Prescriptions   None    PDMP not reviewed this encounter.   Enedelia Dorna HERO, [place] 12/01/23 [phone]

## 2023-12-01 NOTE — ED Triage Notes (Signed)
 Pt c/o cluster migraines. That began this morning.

## 2023-12-01 NOTE — Discharge Instructions (Signed)
 You have been evaluated today for headache.  You were given medicines for your headache in the clinic today which included a strong NSAID, so do not  take ibuprofen or other NSAIDS (Aleve, aspirin, naproxen, ibuprofen, goody powder, etc.) for the next 12 hours.  Starting tomorrow, take 600mg  ibuprofen every 6 hours or tylenol 1,000 every 6 hours as needed for pain.  Avoid areas of loud noise/harsh light and remember to drink plenty of water to stay well hydrated.  Please follow up with your primary care provider for further management of your headaches.  Please seek emergency medical care if you experience worsening or uncontrolled pain, vision changes, recurrent vomiting, difficulty with normal activities, abnormal behavior, difficulty walking, numbness, weakness, or any other concerning symptoms.

## 2023-12-02 ENCOUNTER — Other Ambulatory Visit: Payer: Self-pay | Admitting: Internal Medicine

## 2023-12-02 ENCOUNTER — Other Ambulatory Visit: Payer: Self-pay

## 2023-12-07 ENCOUNTER — Telehealth (INDEPENDENT_AMBULATORY_CARE_PROVIDER_SITE_OTHER): Payer: Medicaid Other | Admitting: Internal Medicine

## 2023-12-07 DIAGNOSIS — K529 Noninfective gastroenteritis and colitis, unspecified: Secondary | ICD-10-CM

## 2023-12-07 DIAGNOSIS — K219 Gastro-esophageal reflux disease without esophagitis: Secondary | ICD-10-CM

## 2023-12-07 DIAGNOSIS — E559 Vitamin D deficiency, unspecified: Secondary | ICD-10-CM

## 2023-12-07 MED ORDER — DIPHENOXYLATE-ATROPINE 2.5-0.025 MG PO TABS: 1.0000 | ORAL_TABLET | Freq: Four times a day (QID) | ORAL | 0 refills | Status: DC | PRN

## 2023-12-07 NOTE — Progress Notes (Signed)
Patient ID: Nicole Melendez, female   DOB: Dec 21, 1975, 48 y.o.   MRN: [id number]  Virtual Visit via Video Note  I connected with Nicole Melendez on Dec 07, 2023 at  3:20 PM EST by a video enabled telemedicine application and verified that I am speaking with the correct person using two identifiers.  Location of all participants today Patient: at home Provider: at office   I discussed the limitations of evaluation and management by telemedicine and the availability of in person appointments. The patient expressed understanding and agreed to proceed.  History of Present Illness: Here with acute  onset 2 -3 days feverish, n/v, crampy abd pains, and watery diarrhea.  Others in family ill with same. Pt denies chest pain, increased sob or doe, wheezing, orthopnea, PND, increased LE swelling, palpitations, dizziness or syncope.   Pt denies polydipsia, polyuria, or new focal neuro s/s.    Pt denies fever, wt loss, night sweats, loss of appetite, or other constitutional symptoms  Denies worsening reflux, dysphagia, other bowel change or blood. Past Medical History:  Diagnosis Date   Allergic rhinitis, cause unspecified    Anemia    Anxiety    Arthritis    COPD (chronic obstructive pulmonary disease) (HCC)    Depression    Diabetes mellitus type II    steroid related, patient states "im no longer diabetic"   Essential hypertension 08/08/2015   GERD (gastroesophageal reflux disease)    History of blood transfusion    Hyperlipidemia    Menorrhagia    Migraine    Morbid obesity (HCC)    Otitis media 06/28/2015   Peptic ulcer disease    Pneumonia    Positive ANA (antinuclear antibody) 02/14/2012   Sarcoid    including hand per rheumatology-Dr. 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(HCC)    Shortness of breath    on exertion   Varicose veins with pain    Past Surgical History:  Procedure Laterality Date   ABDOMINAL HYSTERECTOMY     COLONOSCOPY WITH PROPOFOL N/A 10/19/2016   Procedure:  COLONOSCOPY WITH PROPOFOL;  Surgeon: Napoleon Form, MD;  Location: [facility];  Service: Endoscopy;  Laterality: N/A;   ESOPHAGOGASTRODUODENOSCOPY (EGD) WITH PROPOFOL N/A 10/19/2016   Procedure: ESOPHAGOGASTRODUODENOSCOPY (EGD) WITH PROPOFOL;  Surgeon: Napoleon Form, MD;  Location: [facility];  Service: Endoscopy;  Laterality: N/A;   HERNIA MESH REMOVAL  02/2013   HERNIA REPAIR     KNEE ARTHROSCOPY Left 01/15/2022   Procedure: ARTHROSCOPY KNEE;  Surgeon: Beverely Low, MD;  Location: [facility];  Service: Orthopedics;  Laterality: Left;   uterine ablation  03/2010   WISDOM TOOTH EXTRACTION      reports that she quit smoking about 11 years ago. Her smoking use included cigarettes. She started smoking about 31 years ago. She has a 20 pack-year smoking history. She has never used smokeless tobacco. She reports that she does not drink alcohol and does not use drugs. family history includes Allergies in her mother; Bone cancer in her maternal aunt; Breast cancer in her maternal aunt; Diabetes in her father and mother; Heart attack in her mother; Heart disease in her father; Hypertension in an other family member; Lung cancer in her maternal aunt; Ovarian cancer in her maternal aunt; Rheum arthritis in her father; Stroke in her father and maternal uncle. Allergies  Allergen Reactions   Azithromycin Hives    (z pak) hives   Bee Venom Anaphylaxis   Flagyl [Metronidazole] Hives and Shortness Of Breath  Penicillins Shortness Of Breath and Swelling    Has patient had a PCN reaction causing immediate rash, facial/tongue/throat swelling, SOB or lightheadedness with hypotension: Yes Has patient had a PCN reaction causing severe rash involving mucus membranes or skin necrosis: No Has patient had a PCN reaction that required hospitalization No Has patient had a PCN reaction occurring within the last 10 years: No If all of the above answers are "NO", then may proceed with Cephalosporin  use.  REACTION: swelling and difficulty breathing   Shellfish Allergy Anaphylaxis   Klonopin [Clonazepam] Other (See Comments)    Memory difficulty   Cephalexin Hives and Swelling   Cephalosporins Hives   Current Outpatient Medications on File Prior to Visit  Medication Sig Dispense Refill   acetaminophen (TYLENOL) 650 MG CR tablet Take 650-1,300 mg by mouth every 8 (eight) hours as needed for pain.     albuterol (VENTOLIN HFA) 108 (90 Base) MCG/ACT inhaler INHALE 2 PUFFS INTO THE LUNGS EVERY 4 HOURS AS NEEDED FOR WHEEZING OR SHORTNESS OF BREATH 8.5 g 0   ALPRAZolam (XANAX) 0.5 MG tablet Take 1 tablet (0.5 mg total) by mouth 2 (two) times daily as needed for anxiety. 60 tablet 2   azelastine (ASTELIN) 0.1 % nasal spray Place 2 sprays into both nostrils 2 (two) times daily. Use in each nostril as directed 30 mL 12   baclofen (LIORESAL) 10 MG tablet TAKE 1 TABLET(10 MG) BY MOUTH THREE TIMES DAILY 60 tablet 1   BD PEN NEEDLE NANO 2ND GEN 32G X 4 MM MISC USE AS DIRECTED THREE TIMES DAILY 300 each 3   Blood Glucose Monitoring Suppl (ONETOUCH VERIO) w/Device KIT Use as directed daily E11.9 1 kit 0   clindamycin (CLEOCIN) 150 MG capsule Take 150 mg by mouth 4 (four) times daily.     fluticasone (FLONASE) 50 MCG/ACT nasal spray Place 2 sprays into both nostrils daily. 16 g 0   glucose blood (ONETOUCH VERIO) test strip USE AS DIRECTED THREE TIMES DAILY E11.9 300 strip 11   insulin aspart (NOVOLOG FLEXPEN) 100 UNIT/ML FlexPen ADMINISTER 12 UNITS UNDER THE SKIN FOUR TIMES DAILY 15 mL 2   levofloxacin (LEVAQUIN) 500 MG tablet Take 1 tablet (500 mg total) by mouth daily. 10 tablet 0   mometasone-formoterol (DULERA) 200-5 MCG/ACT AERO Inhale 2 puffs into the lungs in the morning and at bedtime. 8.8 g 5   ondansetron (ZOFRAN) 4 MG/5ML solution Take 5 mLs (4 mg total) by mouth every 8 (eight) hours as needed for nausea or vomiting. 50 mL 0   ondansetron (ZOFRAN-ODT) 4 MG disintegrating tablet Take 1 tablet  (4 mg total) by mouth every 8 (eight) hours as needed. 20 tablet 0   ondansetron (ZOFRAN-ODT) 4 MG disintegrating tablet Take 1 tablet (4 mg total) by mouth every 8 (eight) hours as needed. 20 tablet 0   oxyCODONE-acetaminophen (PERCOCET) 10-325 MG tablet Take 1 tablet by mouth every 6 (six) hours as needed for pain. Per pain management 120 tablet 0   pantoprazole (PROTONIX) 40 MG tablet Take 1 tablet (40 mg total) by mouth 2 (two) times daily before a meal. 60 tablet 5   Plecanatide (TRULANCE) 3 MG TABS 1 tab by mouth once daily 90 tablet 3   Rimegepant Sulfate (NURTEC) 75 MG TBDP 1 tab by mouth once daily as needed 16 tablet 11   rizatriptan (MAXALT-MLT) 10 MG disintegrating tablet Take 1 tablet earliest onset of migraine.  May repeat in 2 hours if needed.  Maximum 2  tablets in 24 hours 9 tablet 11   rosuvastatin (CRESTOR) 40 MG tablet 1 tab by mouth once daily 90 tablet 3   Semaglutide, 1 MG/DOSE, 4 MG/3ML SOPN Inject 1 mg as directed once a week. 3 mL 11   sertraline (ZOLOFT) 100 MG tablet Take 1 tablet (100 mg total) by mouth 2 (two) times daily. 60 tablet 11   sucralfate (CARAFATE) 1 g tablet TAKE 1 TABLET(1 GRAM) BY MOUTH FOUR TIMES DAILY AND AT BEDTIME WITH MEALS 120 tablet 3   telmisartan-hydrochlorothiazide (MICARDIS HCT) 40-12.5 MG tablet TAKE 1 TABLET BY MOUTH DAILY 90 tablet 3   triamcinolone (NASACORT) 55 MCG/ACT AERO nasal inhaler Place 2 sprays into the nose daily. 1 each 12   TURMERIC PO Take 1 tablet by mouth daily.     vitamin B-12 (CYANOCOBALAMIN) 1000 MCG tablet Take 1,000 mcg by mouth daily.     Vitamin D, Ergocalciferol, (DRISDOL) 1.25 MG (50000 UNIT) CAPS capsule Take by mouth.     XOPENEX HFA 45 MCG/ACT inhaler INHALE 1 TO 2 PUFFS INOT THE LUNGS EVERY 8 HOURS AS NEEDED FOR WHEEZING OR SHORTNESS OF BREATH, USE 2 PUFFS 3 TIMES DAILY THEN BACK TO HOME REGIMEN 15 g 3   zonisamide (ZONEGRAN) 100 MG capsule TAKE 2 CAPSULES(200 MG) BY MOUTH DAILY 60 capsule 3   No current  facility-administered medications on file prior to visit.    Observations/Objective: Alert, mild ill, appropriate mood and affect, resps normal, cn 2-12 intact, moves all 4s, no visible rash or swelling Lab Results  Component Value Date   WBC 7.3 11/22/2023   HGB 13.3 11/22/2023   HCT 38.6 11/22/2023   PLT 260 11/22/2023   GLUCOSE 99 11/22/2023   CHOL 143 09/14/2023   TRIG 99.0 09/14/2023   HDL 43.10 09/14/2023   LDLDIRECT 135.0 07/05/2011   LDLCALC 81 09/14/2023   ALT 12 11/22/2023   AST 13 (L) 11/22/2023   NA 139 11/22/2023   K 3.7 11/22/2023   CL 106 11/22/2023   CREATININE 0.67 11/22/2023   BUN 9 11/22/2023   CO2 26 11/22/2023   TSH 0.75 09/14/2023   INR 1.3 11/17/2012   HGBA1C 5.9 09/14/2023   MICROALBUR <0.7 09/14/2023   Assessment and Plan: See notes  Follow Up Instructions: See notes   I discussed the assessment and treatment plan with the patient. The patient was provided an opportunity to ask questions and all were answered. The patient agreed with the plan and demonstrated an understanding of the instructions.   The patient was advised to call back or seek an in-person evaluation if the symptoms worsen or if the condition fails to improve as anticipated.   [name], MD

## 2023-12-08 ENCOUNTER — Encounter: Payer: Self-pay | Admitting: Internal Medicine

## 2023-12-09 ENCOUNTER — Other Ambulatory Visit: Payer: Self-pay

## 2023-12-09 MED ORDER — GABAPENTIN 300 MG PO CAPS: ORAL_CAPSULE | ORAL | 1 refills | Status: DC

## 2023-12-10 ENCOUNTER — Encounter: Payer: Self-pay | Admitting: Internal Medicine

## 2023-12-10 DIAGNOSIS — K529 Noninfective gastroenteritis and colitis, unspecified: Secondary | ICD-10-CM | POA: Insufficient documentation

## 2023-12-10 NOTE — Assessment & Plan Note (Signed)
 Last vitamin D Lab Results  Component Value Date   VD25OH 19.94 (L) 09/14/2023   Low, to start oral replacement

## 2023-12-10 NOTE — Patient Instructions (Signed)
 Please take all new medication as prescribed

## 2023-12-10 NOTE — Assessment & Plan Note (Signed)
Likely viral etiology o/w unclear, for fluids, rest, tylenol prn, lomotil prn and work note

## 2023-12-10 NOTE — Assessment & Plan Note (Signed)
O/w stable, cont current med tx

## 2023-12-21 ENCOUNTER — Other Ambulatory Visit (HOSPITAL_COMMUNITY): Payer: Self-pay

## 2023-12-23 ENCOUNTER — Ambulatory Visit: Payer: Medicaid Other | Admitting: Emergency Medicine

## 2023-12-23 ENCOUNTER — Encounter: Payer: Self-pay | Admitting: Emergency Medicine

## 2023-12-23 VITALS — BP 115/74 | HR 89 | Temp 98.4°F | Ht 67.0 in | Wt 257.4 lb

## 2023-12-23 DIAGNOSIS — J301 Allergic rhinitis due to pollen: Secondary | ICD-10-CM | POA: Diagnosis not present

## 2023-12-23 DIAGNOSIS — D860 Sarcoidosis of lung: Secondary | ICD-10-CM | POA: Diagnosis not present

## 2023-12-23 DIAGNOSIS — J449 Chronic obstructive pulmonary disease, unspecified: Secondary | ICD-10-CM

## 2023-12-23 NOTE — Assessment & Plan Note (Signed)
 We reviewed your pulmonary function testing today.  These are stable.  Great news.  We will probably repeat these in about 2 years as long as there are no changes in your breathing We will plan to repeat your CT scan of the chest in September 2026. Follow with Dr Delton Coombes in 6 months or sooner if you have any problems

## 2023-12-23 NOTE — Progress Notes (Signed)
   Subjective:    Patient ID: Nicole Melendez, female    DOB: [birth year], 48 y.o.   MRN: [id number]  HPI  ROV 12/23/2023 --Nicole Melendez is 48 with a history of sarcoidosis, former tobacco use, associated COPD/asthma.  She has subclinical OSA with an AHI of 2.8/h, not on CPAP.  I last saw her in October 2024.  Her CT chest from 05/2023 was stable.  She underwent pulmonary function testing in December as below. She had a URI / gastroenteritis in mid-January, lots of nasal congestion and drainage. She is still having nasal soreness, ? Scabs in her nose. Having a lot of throat clearing. She has been holding her flonase / nasacort and astelin since the URI. She wants to restart nasacort when able.   Pulmonary function testing 10/04/2023 reviewed by me showed mixed obstruction and restriction on spirometry with a grossly normal flow-volume curve.  The FEV1 was 2.31 L (76% predicted) which is stable going back to 9 years ago.  There was no bronchodilator response.  Her lung volumes were normal.  Diffusion capacity normal.   Review of Systems  As per HPI     Objective:   Physical Exam  Vitals:   12/23/23 0900 12/23/23 0905  BP: 115/74 115/74  Pulse: 89 89  Temp: 98.4 F (36.9 C) 98.4 F (36.9 C)  TempSrc: Oral Oral  SpO2: 97% 97%  Weight: 257 lb 6.4 oz (116.8 kg) 257 lb 6.4 oz (116.8 kg)  Height: 5\' 7"  (1.702 m) 5\' 7"  (1.702 m)    Gen: Pleasant, obese woman, in no distress,  normal affect  ENT: No lesions,  mouth clear,  oropharynx clear, no postnasal drip  Neck: No JVD, no stridor  Lungs: No use of accessory muscles, clear without rales or rhonchi, soft end expiratory wheeze on forced expiration Cardiovascular: RRR, heart sounds normal, no murmur or gallops, no peripheral edema  Musculoskeletal: No deformities, no cyanosis or clubbing  Neuro: alert, non focal  Skin: Warm, no lesions or rashes        Assessment & Plan:  Sarcoidosis of lung (HCC) We reviewed your pulmonary function  testing today.  These are stable.  Great news.  We will probably repeat these in about 2 years as long as there are no changes in your breathing We will plan to repeat your CT scan of the chest in September 2026. Follow with Dr Delton Coombes in 6 months or sooner if you have any problems  Allergic rhinitis She had a recent URI that caused a lot of nasal irritation.  She still dealing with soreness and some scabs.  Temporarily off of her nasal steroid and Astelin and I agree with this.   Hold off on restarting your Nasacort nasal spray and Astelin nasal spray until your nasal irritation has improved.  Okay to continue your other allergy medications. Try using nasal saline gel to keep the inside of your nose moist and promote healing If you continue to have nasal irritation and scabbing after about 2 weeks then please message [place] and we will refer you to ENT so they can evaluate and assess for nasal polyps  COPD (chronic obstructive pulmonary disease) (HCC) Please continue your [facility] as you have been taking it.  Rinse and gargle after using.    Levy Pupa, MD, PhD 12/23/2023, 12:19 PM [facility] [phone] or if no answer [phone]

## 2023-12-23 NOTE — Patient Instructions (Addendum)
 We reviewed your pulmonary function testing today.  These are stable.  Great news.  We will probably repeat these in about 2 years as long as there are no changes in your breathing We will plan to repeat your CT scan of the chest in September 2026. Please continue your [facility] as you have been taking it.  Rinse and gargle after using. Hold off on restarting your Nasacort nasal spray and Astelin nasal spray until your nasal irritation has improved.  Okay to continue your other allergy medications. Try using nasal saline gel to keep the inside of your nose moist and promote healing If you continue to have nasal irritation and scabbing after about 2 weeks then please message [place] and we will refer you to ENT so they can evaluate and assess for nasal polyps Follow with Dr Delton Coombes in 6 months or sooner if you have any problems

## 2023-12-23 NOTE — Assessment & Plan Note (Signed)
 Please continue your [facility] as you have been taking it.  Rinse and gargle after using.

## 2023-12-23 NOTE — Progress Notes (Signed)
 NEUROLOGY FOLLOW UP OFFICE NOTE  Nicole Melendez [id number]  Assessment/Plan:   Migraine without aura, without status migrainosus, not intractable     1.  Migraine prevention: Plan to start Emgality.  Continue zonisamide 200mg  daily for now.  3. Migraine rescue:  will have her try samples of Nurtec (she will update me); Zofran 4mg  for nausea.  4.  Limit use of pain relievers to no more than 2 days out of week to prevent risk of rebound or medication-overuse headache. 5.  Keep headache diary 6.  Follow up in 6 months.   Subjective:  Nicole Melendez is a 48 year old right-handed female with sarcoidosis, diabetes with neuropathy, GERD, PUD, HTN, and hyperlipidemia who follows up for migraines and lumbar radiculopathy.  History supplemented by ED note.   MIGRAINE: Update: She reports increased headache frequency and duration beginning in January. Intensity:  7-9/10 Duration:  days.  No longer responding to rizatriptan. Frequency:  14 days a month.  Seen in the ED on 2/6 for intractable migraine requiring headache cocktail.   Current NSAIDS: ASA 81mg  PRN, limits due to PUD Current analgesics: Tylenol, Percocet (pain) Current triptans: rizatriptan 10mg  Current ergotamine: None Current anti-emetic: Zofran 4 mg Current muscle relaxants: none Current anti-anxiolytic: Alprazolam, hydroxyzine Current sleep aide: None Current Antihypertensive medications: Lotensin Current Antidepressant medications: Sertraline Current Anticonvulsant medications: Zonisamide 200 mg, gabapentin 900mg  TID   Current anti-CGRP: None Current Vitamins/Herbal/Supplements: Folic acid, D3, turmeric, B12 Current Antihistamines/Decongestants: Astelin Other therapy: None Hormone/birth control: None    Caffeine: No Alcohol: No Smoker: No Diet: Hydrates, baked chicken, steamed vegetables Exercise: Yes Depression: Yes; Anxiety: Yes.  Both controlled. Other pain: back pain.  May receive ablation.  Some mild  residual knee pain since the surgery last year. Sleep hygiene: Good   History: Onset:  Mid-30s Location:  Back of head, right sided Quality:  pounding Initial Intensity:  8-9/10 Aura:  no Prodrome:  no Associated symptoms: Nausea, vomiting, osmophobia, right eye blurred vision, sometimes photophobia.  There is no associated unilateral numbness or weakness. Initial Duration:  2 days (within one hour with sumatriptan) Initial Frequency:  Varies.  Some months 1-2 days, some months 10 days Triggers:  Unknown Relieving factors:  no Activity:  Cannot function 1 to 2 days per month   Past NSAIDS:  Ibuprofen, naproxen (discontinued due to stomach ulcers) Past analgesics:  Percocet, tramadol (for back pain) Past abortive triptans:  sumatriptan 100mg  Past muscle relaxants:  tizanidine 4mg , Flexeril, baclofen Past anti-emetic:  Phenergan, Zofran Past antihypertensive medications: Lasix Past antidepressant medications:  Wellbutrin Past anticonvulsant medications:  topiramate (caused nausea and vomiting) Past vitamins/Herbal/Supplements:  no Past antihistamines/decongestants:  Zyrtec Other past therapies:  meditation  Family history of headache:  mother    RIGHT LUMBAR RADICULOPATHY: Update: Followed by pain management.  Receiving injections in the back.  Right leg is becoming stronger.  Walking over a mile a day.     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  On 03/14/19, she underwent radiofrequency neurolysis of the left lumbar facet nerves at L2-L3, L3-L4, L4-L5 and L5-S1 levels.  She had no complications.  On 04/23/19, she underwent the same procedure on the right side at the same levels. She noted some right leg weakness afterwards with some pain.  It progressively got worse.  When she is on her feet, the right lower back pain increases and her leg gives out but no leg pain.  No numbness in right leg  but notes new numbness in the big toe of the left  foot.  Due to the weakness, she started having falls.  She also notes that sometimes when wakes up in the morning, she has trouble making it to the bathroom to urinate.  She has had incontinence.  No perineal numbness.  She now resorts to using a cane to ambulate.  She had NCV-EMG of the lower extremities on 05/22/19, which demonstrated diminished amplitude of both tibaial motor nerves and absent F-wave response in the right peroneal motor nerve but otherwise unremarkable with no explanation for symptoms.  MRI of lumbar spine without contrast on 06/09/19 showed stable degenerative disc disease and facet arthropathy with mild bilateral neuroforaminal stenosis at L4-L5 and moderate neuroforaminal stenosis at L5-S1, but no new structural explanation for symptoms.  Since this started, she reports increased migraines.    CERVICAL RADICULOPATHY: Following fall in January 2022, had symptoms of neck pain and cervical radiculopathy.  MRI of cervical spine on 02/01/2021 personally reviewed showed cervical spondylosis most prominent from C4 through C7 with right paracentral disc protrusion at C6-7 contacting the right C7 nerve root with severe bilateral neural foraminal narrowing and mild central canal stenosis.Marland Kitchen  PAST MEDICAL HISTORY: Past Medical History:  Diagnosis Date   Allergic rhinitis, cause unspecified    Anemia    Anxiety    Arthritis    COPD (chronic obstructive pulmonary disease) (HCC)    Depression    Diabetes mellitus type II    steroid related, patient states "im no longer diabetic"   Essential hypertension 08/08/2015   GERD (gastroesophageal reflux disease)    History of blood transfusion    Hyperlipidemia    Menorrhagia    Migraine    Morbid obesity (HCC)    Otitis media 06/28/2015   Peptic ulcer disease    Pneumonia    Positive ANA (antinuclear antibody) 02/14/2012   Sarcoid    including hand per rheumatology-Dr. 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(HCC)    Shortness of breath    on  exertion   Varicose veins with pain     MEDICATIONS: Current Outpatient Medications on File Prior to Visit  Medication Sig Dispense Refill   acetaminophen (TYLENOL) 650 MG CR tablet Take 650-1,300 mg by mouth every 8 (eight) hours as needed for pain.     albuterol (VENTOLIN HFA) 108 (90 Base) MCG/ACT inhaler INHALE 2 PUFFS INTO THE LUNGS EVERY 4 HOURS AS NEEDED FOR WHEEZING OR SHORTNESS OF BREATH 8.5 g 0   ALPRAZolam (XANAX) 0.5 MG tablet Take 1 tablet (0.5 mg total) by mouth 2 (two) times daily as needed for anxiety. 60 tablet 2   azelastine (ASTELIN) 0.1 % nasal spray Place 2 sprays into both nostrils 2 (two) times daily. Use in each nostril as directed 30 mL 12   baclofen (LIORESAL) 10 MG tablet TAKE 1 TABLET(10 MG) BY MOUTH THREE TIMES DAILY 60 tablet 1   BD PEN NEEDLE NANO 2ND GEN 32G X 4 MM MISC USE AS DIRECTED THREE TIMES DAILY 300 each 3   Blood Glucose Monitoring Suppl (ONETOUCH VERIO) w/Device KIT Use as directed daily E11.9 1 kit 0   clindamycin (CLEOCIN) 150 MG capsule Take 150 mg by mouth 4 (four) times daily.     diphenoxylate-atropine (LOMOTIL) 2.5-0.025 MG tablet Take 1 tablet by mouth 4 (four) times daily as needed for diarrhea or loose stools. 30 tablet 0   fluticasone (FLONASE) 50 MCG/ACT nasal spray Place 2 sprays into both nostrils daily.  16 g 0   gabapentin (NEURONTIN) 300 MG capsule 3 tab by mouth three times daily 270 capsule 1   glucose blood (ONETOUCH VERIO) test strip USE AS DIRECTED THREE TIMES DAILY E11.9 300 strip 11   insulin aspart (NOVOLOG FLEXPEN) 100 UNIT/ML FlexPen ADMINISTER 12 UNITS UNDER THE SKIN FOUR TIMES DAILY 15 mL 2   levofloxacin (LEVAQUIN) 500 MG tablet Take 1 tablet (500 mg total) by mouth daily. 10 tablet 0   mometasone-formoterol (DULERA) 200-5 MCG/ACT AERO Inhale 2 puffs into the lungs in the morning and at bedtime. 8.8 g 5   ondansetron (ZOFRAN) 4 MG/5ML solution Take 5 mLs (4 mg total) by mouth every 8 (eight) hours as needed for nausea or  vomiting. 50 mL 0   ondansetron (ZOFRAN-ODT) 4 MG disintegrating tablet Take 1 tablet (4 mg total) by mouth every 8 (eight) hours as needed. 20 tablet 0   ondansetron (ZOFRAN-ODT) 4 MG disintegrating tablet Take 1 tablet (4 mg total) by mouth every 8 (eight) hours as needed. 20 tablet 0   oxyCODONE-acetaminophen (PERCOCET) 10-325 MG tablet Take 1 tablet by mouth every 6 (six) hours as needed for pain. Per pain management 120 tablet 0   pantoprazole (PROTONIX) 40 MG tablet Take 1 tablet (40 mg total) by mouth 2 (two) times daily before a meal. 60 tablet 5   Plecanatide (TRULANCE) 3 MG TABS 1 tab by mouth once daily 90 tablet 3   Rimegepant Sulfate (NURTEC) 75 MG TBDP 1 tab by mouth once daily as needed 16 tablet 11   rizatriptan (MAXALT-MLT) 10 MG disintegrating tablet Take 1 tablet earliest onset of migraine.  May repeat in 2 hours if needed.  Maximum 2 tablets in 24 hours 9 tablet 11   rosuvastatin (CRESTOR) 40 MG tablet 1 tab by mouth once daily 90 tablet 3   Semaglutide, 1 MG/DOSE, 4 MG/3ML SOPN Inject 1 mg as directed once a week. 3 mL 11   sertraline (ZOLOFT) 100 MG tablet Take 1 tablet (100 mg total) by mouth 2 (two) times daily. 60 tablet 11   sucralfate (CARAFATE) 1 g tablet TAKE 1 TABLET(1 GRAM) BY MOUTH FOUR TIMES DAILY AND AT BEDTIME WITH MEALS 120 tablet 3   telmisartan-hydrochlorothiazide (MICARDIS HCT) 40-12.5 MG tablet TAKE 1 TABLET BY MOUTH DAILY 90 tablet 3   triamcinolone (NASACORT) 55 MCG/ACT AERO nasal inhaler Place 2 sprays into the nose daily. 1 each 12   TURMERIC PO Take 1 tablet by mouth daily.     vitamin B-12 (CYANOCOBALAMIN) 1000 MCG tablet Take 1,000 mcg by mouth daily.     Vitamin D, Ergocalciferol, (DRISDOL) 1.25 MG (50000 UNIT) CAPS capsule Take by mouth.     XOPENEX HFA 45 MCG/ACT inhaler INHALE 1 TO 2 PUFFS INOT THE LUNGS EVERY 8 HOURS AS NEEDED FOR WHEEZING OR SHORTNESS OF BREATH, USE 2 PUFFS 3 TIMES DAILY THEN BACK TO HOME REGIMEN 15 g 3   zonisamide (ZONEGRAN)  100 MG capsule TAKE 2 CAPSULES(200 MG) BY MOUTH DAILY 60 capsule 3   No current facility-administered medications on file prior to visit.    ALLERGIES: Allergies  Allergen Reactions   Azithromycin Hives    (z pak) hives   Bee Venom Anaphylaxis   Flagyl [Metronidazole] Hives and Shortness Of Breath   Penicillins Shortness Of Breath and Swelling    Has patient had a PCN reaction causing immediate rash, facial/tongue/throat swelling, SOB or lightheadedness with hypotension: Yes Has patient had a PCN reaction causing severe rash involving mucus  membranes or skin necrosis: No Has patient had a PCN reaction that required hospitalization No Has patient had a PCN reaction occurring within the last 10 years: No If all of the above answers are "NO", then may proceed with Cephalosporin use.  REACTION: swelling and difficulty breathing   Shellfish Allergy Anaphylaxis   Klonopin [Clonazepam] Other (See Comments)    Memory difficulty   Cephalexin Hives and Swelling   Cephalosporins Hives    FAMILY HISTORY: Family History  Problem Relation Age of Onset   Allergies Mother    Heart attack Mother    Diabetes Mother    Diabetes Father    Heart disease Father    Rheum arthritis Father    Stroke Father    Hypertension Other    Ovarian cancer Maternal Aunt    Lung cancer Maternal Aunt    Breast cancer Maternal Aunt    Bone cancer Maternal Aunt    Stroke Maternal Uncle    Other Neg Hx       Objective:  Blood pressure 128/72, pulse (!) 105, height 5\' 6"  (1.676 m), weight 258 lb (117 kg), last menstrual period 07/18/2011, SpO2 98%. General: No acute distress.  Patient appears well-groomed.   Head:  Normocephalic/atraumatic Neck:  Supple.  right paraspinal tenderness.  Full range of motion. Heart:  Regular rate and rhythm. Neuro:  Alert and oriented.  Speech fluent and not dysarthric.  Language intact.  CN II-XII intact.  Bulk and tone normal.  Muscle strength 5/5 throughout.  Deep tendon  reflexes 3+ throughout, toes downgoing.  Gait normal.  Romberg negative.    Shon Millet, DO  CC: [name], MD

## 2023-12-23 NOTE — Assessment & Plan Note (Signed)
 She had a recent URI that caused a lot of nasal irritation.  She still dealing with soreness and some scabs.  Temporarily off of her nasal steroid and Astelin and I agree with this.   Hold off on restarting your Nasacort nasal spray and Astelin nasal spray until your nasal irritation has improved.  Okay to continue your other allergy medications. Try using nasal saline gel to keep the inside of your nose moist and promote healing If you continue to have nasal irritation and scabbing after about 2 weeks then please message [place] and we will refer you to ENT so they can evaluate and assess for nasal polyps

## 2023-12-27 ENCOUNTER — Encounter: Payer: Self-pay | Admitting: Neurology

## 2023-12-27 ENCOUNTER — Ambulatory Visit: Payer: Medicaid Other | Admitting: Neurology

## 2023-12-27 VITALS — BP 128/72 | HR 105 | Ht 66.0 in | Wt 258.0 lb

## 2023-12-27 DIAGNOSIS — M7061 Trochanteric bursitis, right hip: Secondary | ICD-10-CM | POA: Diagnosis not present

## 2023-12-27 DIAGNOSIS — G43009 Migraine without aura, not intractable, without status migrainosus: Secondary | ICD-10-CM | POA: Diagnosis not present

## 2023-12-27 DIAGNOSIS — M539 Dorsopathy, unspecified: Secondary | ICD-10-CM | POA: Diagnosis not present

## 2023-12-27 DIAGNOSIS — Z6841 Body Mass Index (BMI) 40.0 and over, adult: Secondary | ICD-10-CM | POA: Diagnosis not present

## 2023-12-27 DIAGNOSIS — Z79899 Other long term (current) drug therapy: Secondary | ICD-10-CM | POA: Diagnosis not present

## 2023-12-27 MED ORDER — ZONISAMIDE 100 MG PO CAPS: 200.0000 mg | ORAL_CAPSULE | Freq: Every day | ORAL | 5 refills | Status: DC

## 2023-12-27 MED ORDER — EMGALITY 120 MG/ML ~~LOC~~ SOAJ: 240.0000 mg | Freq: Once | SUBCUTANEOUS | 0 refills | Status: AC

## 2023-12-27 NOTE — Patient Instructions (Addendum)
 Plan to start Emgality - first dose 2 injections.  Let me know when you pick it up and I will then send prescription for standing order of 1 injection every 28 days Take Nurtec at earliest onset of migraine (maximum 1 in 24 hours).  Let me know if it works and you would like a prescription. Follow up 6 months.

## 2023-12-27 NOTE — Progress Notes (Signed)
 Medication Samples have been provided to the patient.  Drug name: Nurtec       Strength: 75 mg        Qty: 2   LOT: [id number]  Exp.Date: 6/28  Dosing instructions: as needed  The patient has been instructed regarding the correct time, dose, and frequency of taking this medication, including desired effects and most common side effects.   Leida Lauth 8:47 AM 12/27/2023

## 2023-12-28 ENCOUNTER — Telehealth: Payer: Self-pay

## 2023-12-28 NOTE — Telephone Encounter (Signed)
 Pharmacy Patient Advocate Encounter   Received notification from Onbase that prior authorization for Mounjaro 2.5MG /0.5ML auto-injectors is due for renewal.   Insurance verification completed.   The patient is insured through [facility].  Action: Medication has been discontinued. Archived Key: Nicole Melendez

## 2024-01-01 ENCOUNTER — Other Ambulatory Visit: Payer: Self-pay | Admitting: Internal Medicine

## 2024-01-02 ENCOUNTER — Other Ambulatory Visit (HOSPITAL_COMMUNITY): Payer: Self-pay

## 2024-01-02 ENCOUNTER — Other Ambulatory Visit: Payer: Self-pay

## 2024-01-04 ENCOUNTER — Other Ambulatory Visit (HOSPITAL_COMMUNITY): Payer: Self-pay

## 2024-01-12 ENCOUNTER — Encounter: Payer: Self-pay | Admitting: Emergency Medicine

## 2024-01-12 DIAGNOSIS — J309 Allergic rhinitis, unspecified: Secondary | ICD-10-CM

## 2024-01-13 ENCOUNTER — Encounter: Payer: Self-pay | Admitting: Neurology

## 2024-01-13 ENCOUNTER — Telehealth: Payer: Self-pay

## 2024-01-13 NOTE — Telephone Encounter (Signed)
 Per patient, Pharmacy advised a PA is needed for Emgality.

## 2024-01-20 ENCOUNTER — Other Ambulatory Visit (HOSPITAL_COMMUNITY): Payer: Self-pay

## 2024-01-20 ENCOUNTER — Telehealth: Payer: Self-pay

## 2024-01-20 NOTE — Telephone Encounter (Signed)
 PA request has been Submitted. New Encounter has been or will be created for follow up. For additional info see Pharmacy Prior Auth telephone encounter from 03/28.

## 2024-01-20 NOTE — Telephone Encounter (Signed)
 PA Case: [id number], Status: Approved, Coverage Starts on: 01/20/2024 12:00:00 AM, Coverage Ends on: 04/19/2024 12:00:00 AM.. Authorization Expiration Date: April 19, 2024.

## 2024-01-20 NOTE — Telephone Encounter (Signed)
*  [facility]  Pharmacy Patient Advocate Encounter   Received notification from Pt Calls Messages that prior authorization for Emgality 120MG /ML auto-injectors (migraine)  is required/requested.   Insurance verification completed.   The patient is insured through [facility] .   Per test claim: PA required; PA submitted to above mentioned insurance via CoverMyMeds Key/confirmation #/EOC [id number] Status is pending

## 2024-01-27 DIAGNOSIS — M7061 Trochanteric bursitis, right hip: Secondary | ICD-10-CM | POA: Diagnosis not present

## 2024-01-27 DIAGNOSIS — M539 Dorsopathy, unspecified: Secondary | ICD-10-CM | POA: Diagnosis not present

## 2024-01-27 DIAGNOSIS — Z79899 Other long term (current) drug therapy: Secondary | ICD-10-CM | POA: Diagnosis not present

## 2024-01-27 DIAGNOSIS — Z6839 Body mass index (BMI) 39.0-39.9, adult: Secondary | ICD-10-CM | POA: Diagnosis not present

## 2024-02-01 DIAGNOSIS — Z79899 Other long term (current) drug therapy: Secondary | ICD-10-CM | POA: Diagnosis not present

## 2024-02-13 ENCOUNTER — Telehealth: Payer: Self-pay | Admitting: Neurology

## 2024-02-13 NOTE — Telephone Encounter (Signed)
 Left message to call office to discuss cluster headache, more details needed.

## 2024-02-13 NOTE — Telephone Encounter (Signed)
 Pt called in on 02/10/24 and left message with after hours service. She has cluster migraines and would like to know what to do

## 2024-02-13 NOTE — Telephone Encounter (Signed)
 Pt called back and LM, she is returning a call to christy

## 2024-02-14 ENCOUNTER — Other Ambulatory Visit: Payer: Self-pay | Admitting: Neurology

## 2024-02-14 MED ORDER — EMGALITY 120 MG/ML ~~LOC~~ SOAJ: 120.0000 mg | SUBCUTANEOUS | 5 refills | Status: AC

## 2024-02-14 NOTE — Telephone Encounter (Signed)
 I left message on machine to call office again with update, see orders above per [facility] 02/14/2024 at 10:29am

## 2024-02-15 ENCOUNTER — Telehealth: Payer: Self-pay

## 2024-02-15 ENCOUNTER — Encounter: Payer: Self-pay | Admitting: Neurology

## 2024-02-15 NOTE — Telephone Encounter (Signed)
 PA needed for Nurtec per patient.

## 2024-02-16 ENCOUNTER — Telehealth: Payer: Self-pay | Admitting: Neurology

## 2024-02-16 ENCOUNTER — Ambulatory Visit (INDEPENDENT_AMBULATORY_CARE_PROVIDER_SITE_OTHER)

## 2024-02-16 DIAGNOSIS — G43009 Migraine without aura, not intractable, without status migrainosus: Secondary | ICD-10-CM | POA: Diagnosis not present

## 2024-02-16 MED ORDER — DIPHENHYDRAMINE HCL 50 MG/ML IJ SOLN
50.0000 mg | Freq: Once | INTRAMUSCULAR | Status: AC
  Administered 2024-02-16: 25 mg via INTRAMUSCULAR

## 2024-02-16 MED ORDER — METOCLOPRAMIDE HCL 5 MG/ML IJ SOLN
10.0000 mg | Freq: Once | INTRAMUSCULAR | Status: AC
  Administered 2024-02-16: 10 mg via INTRAMUSCULAR

## 2024-02-16 MED ORDER — KETOROLAC TROMETHAMINE 60 MG/2ML IM SOLN
60.0000 mg | Freq: Once | INTRAMUSCULAR | Status: AC
Start: 2024-02-16 — End: 2024-02-16
  Administered 2024-02-16: 60 mg via INTRAMUSCULAR

## 2024-02-16 NOTE — Telephone Encounter (Signed)
 Left message with after hour service on 02-16-24 at 12:50 pm  Caller states that she is having a migraine and needs a appt to get a shot she also states that she is vomiting and has not urinated in the past 8 hours  the patient also states that she has blood in the vomit   Gave the after hour message to [facility]

## 2024-02-16 NOTE — Telephone Encounter (Signed)
 Pt left message wanting a headache cocktail.

## 2024-02-17 ENCOUNTER — Telehealth: Payer: Self-pay | Admitting: Pharmacy Technician

## 2024-02-17 ENCOUNTER — Other Ambulatory Visit (HOSPITAL_COMMUNITY): Payer: Self-pay

## 2024-02-17 NOTE — Telephone Encounter (Signed)
 PA has been submitted, and telephone encounter has been created. Please see telephone encounter dated 4.25.25.

## 2024-02-17 NOTE — Telephone Encounter (Signed)
 Pharmacy Patient Advocate Encounter   Received notification from Pt Calls Messages that prior authorization for NURTEC 75MG  is required/requested.   Insurance verification completed.   The patient is insured through [facility] .   Per test claim: PA required; PA submitted to above mentioned insurance via CoverMyMeds Key/confirmation #/EOC [id number] Status is pending

## 2024-02-20 ENCOUNTER — Other Ambulatory Visit (HOSPITAL_COMMUNITY): Payer: Self-pay

## 2024-02-20 NOTE — Telephone Encounter (Signed)
 Pharmacy Patient Advocate Encounter  Received notification from [facility] that Prior Authorization for NURTEC 75MG  has been APPROVED from 4.25.25 to 4.25.26. Ran test claim, Copay is $4. This test claim was processed through [facility]- copay amounts may vary at other pharmacies due to pharmacy/plan contracts, or as the patient moves through the different stages of their insurance plan.   PA #/Case ID/Reference #: [id number]

## 2024-02-21 ENCOUNTER — Ambulatory Visit (INDEPENDENT_AMBULATORY_CARE_PROVIDER_SITE_OTHER): Admitting: Podiatry

## 2024-02-21 DIAGNOSIS — L600 Ingrowing nail: Secondary | ICD-10-CM

## 2024-02-21 NOTE — Patient Instructions (Signed)

## 2024-02-21 NOTE — Progress Notes (Signed)
 Subjective:  Patient ID: Nicole Melendez, female    DOB: [birth year],  MRN: [id number]  Chief Complaint  Patient presents with   Ingrown Toenail    48 y.o. female presents with the above complaint.  Patient presents with bilateral hallux medial border ingrown painful to touch is progressive and worse worse with ambulation hurts with pressure and she states it started in her back and is causing her some more discomfort she is a diabetic last A1c of 6.4 she has not seen anyone as prior to seeing me pain scale 7 out of 10 dull aching nature   Review of Systems: Negative except as noted in the HPI. Denies N/V/F/Ch.  Past Medical History:  Diagnosis Date   Allergic rhinitis, cause unspecified    Anemia    Anxiety    Arthritis    COPD (chronic obstructive pulmonary disease) (HCC)    Depression    Diabetes mellitus type II    steroid related, patient states "im no longer diabetic"   Essential hypertension 08/08/2015   GERD (gastroesophageal reflux disease)    History of blood transfusion    Hyperlipidemia    Menorrhagia    Migraine    Morbid obesity (HCC)    Otitis media 06/28/2015   Peptic ulcer disease    Pneumonia    Positive ANA (antinuclear antibody) 02/14/2012   Sarcoid    including hand per rheumatology-Dr. Ebbie Goldmann   Sarcoidosis of lung (HCC)    Shortness of breath    on exertion   Varicose veins with pain     Current Outpatient Medications:    acetaminophen  (TYLENOL ) 650 MG CR tablet, Take 650-1,300 mg by mouth every 8 (eight) hours as needed for pain., Disp: , Rfl:    ALPRAZolam  (XANAX ) 0.5 MG tablet, Take 1 tablet (0.5 mg total) by mouth 2 (two) times daily as needed for anxiety., Disp: 60 tablet, Rfl: 2   azelastine  (ASTELIN ) 0.1 % nasal spray, Place 2 sprays into both nostrils 2 (two) times daily. Use in each nostril as directed, Disp: 30 mL, Rfl: 12   baclofen  (LIORESAL ) 10 MG tablet, TAKE 1 TABLET(10 MG) BY MOUTH THREE TIMES DAILY, Disp: 60 tablet, Rfl: 1   BD  PEN NEEDLE NANO 2ND GEN 32G X 4 MM MISC, USE AS DIRECTED THREE TIMES DAILY, Disp: 300 each, Rfl: 3   Blood Glucose Monitoring Suppl (ONETOUCH VERIO) w/Device KIT, Use as directed daily E11.9, Disp: 1 kit, Rfl: 0   diphenoxylate -atropine  (LOMOTIL ) 2.5-0.025 MG tablet, Take 1 tablet by mouth 4 (four) times daily as needed for diarrhea or loose stools., Disp: 30 tablet, Rfl: 0   gabapentin  (NEURONTIN ) 300 MG capsule, TAKE 3 CAPSULES BY MOUTH THREE TIMES DAILY, Disp: 270 capsule, Rfl: 1   Galcanezumab -gnlm (EMGALITY ) 120 MG/ML SOAJ, Inject 120 mg into the skin every 28 (twenty-eight) days., Disp: 1.12 mL, Rfl: 5   glucose blood (ONETOUCH VERIO) test strip, USE AS DIRECTED THREE TIMES DAILY E11.9, Disp: 300 strip, Rfl: 11   insulin  aspart (NOVOLOG  FLEXPEN) 100 UNIT/ML FlexPen, ADMINISTER 12 UNITS UNDER THE SKIN FOUR TIMES DAILY, Disp: 15 mL, Rfl: 2   mometasone -formoterol  (DULERA ) 200-5 MCG/ACT AERO, Inhale 2 puffs into the lungs in the morning and at bedtime., Disp: 8.8 g, Rfl: 5   ondansetron  (ZOFRAN -ODT) 4 MG disintegrating tablet, Take 1 tablet (4 mg total) by mouth every 8 (eight) hours as needed., Disp: 20 tablet, Rfl: 0   ondansetron  (ZOFRAN -ODT) 4 MG disintegrating tablet, Take 1 tablet (4 mg total) by  mouth every 8 (eight) hours as needed., Disp: 20 tablet, Rfl: 0   oxyCODONE -acetaminophen  (PERCOCET) 10-325 MG tablet, Take 1 tablet by mouth every 6 (six) hours as needed for pain. Per pain management, Disp: 120 tablet, Rfl: 0   pantoprazole  (PROTONIX ) 40 MG tablet, Take 1 tablet (40 mg total) by mouth 2 (two) times daily before a meal., Disp: 60 tablet, Rfl: 5   Rimegepant Sulfate (NURTEC) 75 MG TBDP, 1 tab by mouth once daily as needed (Patient not taking: Reported on 12/27/2023), Disp: 16 tablet, Rfl: 11   rizatriptan  (MAXALT -MLT) 10 MG disintegrating tablet, Take 1 tablet earliest onset of migraine.  May repeat in 2 hours if needed.  Maximum 2 tablets in 24 hours, Disp: 9 tablet, Rfl: 11    rosuvastatin  (CRESTOR ) 40 MG tablet, 1 tab by mouth once daily, Disp: 90 tablet, Rfl: 3   Semaglutide , 1 MG/DOSE, 4 MG/3ML SOPN, Inject 1 mg as directed once a week., Disp: 3 mL, Rfl: 11   sertraline  (ZOLOFT ) 100 MG tablet, Take 1 tablet (100 mg total) by mouth 2 (two) times daily., Disp: 60 tablet, Rfl: 11   sucralfate  (CARAFATE ) 1 g tablet, TAKE 1 TABLET(1 GRAM) BY MOUTH FOUR TIMES DAILY AND AT BEDTIME WITH MEALS, Disp: 120 tablet, Rfl: 3   telmisartan -hydrochlorothiazide (MICARDIS  HCT) 40-12.5 MG tablet, TAKE 1 TABLET BY MOUTH DAILY, Disp: 90 tablet, Rfl: 3   triamcinolone  (NASACORT ) 55 MCG/ACT AERO nasal inhaler, Place 2 sprays into the nose daily., Disp: 1 each, Rfl: 12   TURMERIC PO, Take 1 tablet by mouth daily., Disp: , Rfl:    vitamin B-12 (CYANOCOBALAMIN ) 1000 MCG tablet, Take 1,000 mcg by mouth daily., Disp: , Rfl:    Vitamin D , Ergocalciferol , (DRISDOL ) 1.25 MG (50000 UNIT) CAPS capsule, Take by mouth., Disp: , Rfl:    XOPENEX  HFA 45 MCG/ACT inhaler, INHALE 1 TO 2 PUFFS INOT THE LUNGS EVERY 8 HOURS AS NEEDED FOR WHEEZING OR SHORTNESS OF BREATH, USE 2 PUFFS 3 TIMES DAILY THEN BACK TO HOME REGIMEN, Disp: 15 g, Rfl: 3   zonisamide  (ZONEGRAN ) 100 MG capsule, Take 2 capsules (200 mg total) by mouth daily., Disp: 60 capsule, Rfl: 5  Social History   Tobacco Use  Smoking Status Former   Current packs/day: 0.00   Average packs/day: 1 pack/day for 20.0 years (20.0 ttl pk-yrs)   Types: Cigarettes   Start date: 10/25/1992   Quit date: 10/25/2012   Years since quitting: 11.3  Smokeless Tobacco Never  Tobacco Comments   QUIT 04/2010 AND STARTED BACK 2014 X 3 MONTHS. less than 1 ppd.  started at age 41.      Allergies  Allergen Reactions   Azithromycin Hives    (z pak) hives   Bee Venom Anaphylaxis   Flagyl  [Metronidazole ] Hives and Shortness Of Breath   Penicillins Shortness Of Breath and Swelling    Has patient had a PCN reaction causing immediate rash, facial/tongue/throat swelling, SOB  or lightheadedness with hypotension: Yes Has patient had a PCN reaction causing severe rash involving mucus membranes or skin necrosis: No Has patient had a PCN reaction that required hospitalization No Has patient had a PCN reaction occurring within the last 10 years: No If all of the above answers are "NO", then may proceed with Cephalosporin use.  REACTION: swelling and difficulty breathing   Shellfish Allergy Anaphylaxis   Klonopin  [Clonazepam ] Other (See Comments)    Memory difficulty   Cephalexin Hives and Swelling   Cephalosporins Hives   Objective:  There were no vitals filed for this visit. There is no height or weight on file to calculate BMI. Constitutional Well developed. Well nourished.  Vascular Dorsalis pedis pulses palpable bilaterally. Posterior tibial pulses palpable bilaterally. Capillary refill normal to all digits.  No cyanosis or clubbing noted. Pedal hair growth normal.  Neurologic Normal speech. Oriented to person, place, and time. Epicritic sensation to light touch grossly present bilaterally.  Dermatologic Painful ingrowing nail at medial nail borders of the hallux nail bilaterally. No other open wounds. No skin lesions.  Orthopedic: Normal joint ROM without pain or crepitus bilaterally. No visible deformities. No bony tenderness.   Radiographs: None Assessment:  No diagnosis found. Plan:  Patient was evaluated and treated and all questions answered.  Ingrown Nail, bilaterally -Patient elects to proceed with minor surgery to remove ingrown toenail removal today. Consent reviewed and signed by patient. -Ingrown nail excised. See procedure note. -Educated on post-procedure care including soaking. Written instructions provided and reviewed. -Patient to follow up in 2 weeks for nail check.  Procedure: Excision of Ingrown Toenail Location: Bilateral 1st toe medial nail borders. Anesthesia: Lidocaine  1% plain; 1.5 mL and Marcaine  0.5% plain; 1.5 mL,  digital block. Skin Prep: Betadine. Dressing: Silvadene; telfa; dry, sterile, compression dressing. Technique: Following skin prep, the toe was exsanguinated and a tourniquet was secured at the base of the toe. The affected nail border was freed, split with a nail splitter, and excised. Chemical matrixectomy was then performed with phenol and irrigated out with alcohol. The tourniquet was then removed and sterile dressing applied. Disposition: Patient tolerated procedure well. Patient to return in 2 weeks for follow-up.   No follow-ups on file.

## 2024-02-23 ENCOUNTER — Ambulatory Visit: Admitting: Podiatry

## 2024-02-27 DIAGNOSIS — M7061 Trochanteric bursitis, right hip: Secondary | ICD-10-CM | POA: Diagnosis not present

## 2024-02-27 DIAGNOSIS — M539 Dorsopathy, unspecified: Secondary | ICD-10-CM | POA: Diagnosis not present

## 2024-02-27 DIAGNOSIS — M5030 Other cervical disc degeneration, unspecified cervical region: Secondary | ICD-10-CM | POA: Diagnosis not present

## 2024-02-27 DIAGNOSIS — Z6839 Body mass index (BMI) 39.0-39.9, adult: Secondary | ICD-10-CM | POA: Diagnosis not present

## 2024-02-27 DIAGNOSIS — Z79899 Other long term (current) drug therapy: Secondary | ICD-10-CM | POA: Diagnosis not present

## 2024-02-27 DIAGNOSIS — R0602 Shortness of breath: Secondary | ICD-10-CM | POA: Diagnosis not present

## 2024-02-27 DIAGNOSIS — M51369 Other intervertebral disc degeneration, lumbar region without mention of lumbar back pain or lower extremity pain: Secondary | ICD-10-CM | POA: Diagnosis not present

## 2024-02-27 DIAGNOSIS — M25551 Pain in right hip: Secondary | ICD-10-CM | POA: Diagnosis not present

## 2024-03-01 ENCOUNTER — Other Ambulatory Visit: Payer: Self-pay | Admitting: Emergency Medicine

## 2024-03-01 DIAGNOSIS — R0602 Shortness of breath: Secondary | ICD-10-CM | POA: Diagnosis not present

## 2024-03-01 DIAGNOSIS — M5030 Other cervical disc degeneration, unspecified cervical region: Secondary | ICD-10-CM | POA: Diagnosis not present

## 2024-03-01 DIAGNOSIS — M25551 Pain in right hip: Secondary | ICD-10-CM | POA: Diagnosis not present

## 2024-03-01 DIAGNOSIS — M51369 Other intervertebral disc degeneration, lumbar region without mention of lumbar back pain or lower extremity pain: Secondary | ICD-10-CM | POA: Diagnosis not present

## 2024-03-13 ENCOUNTER — Ambulatory Visit: Payer: Self-pay | Admitting: Internal Medicine

## 2024-03-13 ENCOUNTER — Encounter: Payer: Self-pay | Admitting: Internal Medicine

## 2024-03-13 ENCOUNTER — Ambulatory Visit (INDEPENDENT_AMBULATORY_CARE_PROVIDER_SITE_OTHER): Payer: Medicaid Other | Admitting: Internal Medicine

## 2024-03-13 VITALS — BP 126/78 | HR 75 | Temp 98.5°F | Ht 66.0 in | Wt 261.0 lb

## 2024-03-13 DIAGNOSIS — Z794 Long term (current) use of insulin: Secondary | ICD-10-CM | POA: Diagnosis not present

## 2024-03-13 DIAGNOSIS — E538 Deficiency of other specified B group vitamins: Secondary | ICD-10-CM | POA: Diagnosis not present

## 2024-03-13 DIAGNOSIS — E1140 Type 2 diabetes mellitus with diabetic neuropathy, unspecified: Secondary | ICD-10-CM | POA: Diagnosis not present

## 2024-03-13 DIAGNOSIS — F32A Depression, unspecified: Secondary | ICD-10-CM

## 2024-03-13 DIAGNOSIS — I10 Essential (primary) hypertension: Secondary | ICD-10-CM

## 2024-03-13 DIAGNOSIS — Z0001 Encounter for general adult medical examination with abnormal findings: Secondary | ICD-10-CM

## 2024-03-13 DIAGNOSIS — E559 Vitamin D deficiency, unspecified: Secondary | ICD-10-CM

## 2024-03-13 DIAGNOSIS — Z1231 Encounter for screening mammogram for malignant neoplasm of breast: Secondary | ICD-10-CM

## 2024-03-13 DIAGNOSIS — F411 Generalized anxiety disorder: Secondary | ICD-10-CM | POA: Diagnosis not present

## 2024-03-13 DIAGNOSIS — E7849 Other hyperlipidemia: Secondary | ICD-10-CM

## 2024-03-13 LAB — URINALYSIS, ROUTINE W REFLEX MICROSCOPIC
Bilirubin Urine: NEGATIVE
Hgb urine dipstick: NEGATIVE
Ketones, ur: NEGATIVE
Leukocytes,Ua: NEGATIVE
Nitrite: NEGATIVE
Specific Gravity, Urine: 1.015 (ref 1.000–1.030)
Total Protein, Urine: NEGATIVE
Urine Glucose: NEGATIVE
Urobilinogen, UA: 1 (ref 0.0–1.0)
pH: 7 (ref 5.0–8.0)

## 2024-03-13 LAB — LIPID PANEL
Cholesterol: 152 mg/dL (ref 0–200)
HDL: 52.1 mg/dL (ref >39.00)
LDL Cholesterol: 86 mg/dL (ref 0–99)
NonHDL: 100.01
Total CHOL/HDL Ratio: 3
Triglycerides: 72 mg/dL (ref 0.0–149.0)
VLDL: 14.4 mg/dL (ref 0.0–40.0)

## 2024-03-13 LAB — HEMOGLOBIN A1C: Hgb A1c MFr Bld: 5.6 % (ref 4.6–6.5)

## 2024-03-13 LAB — BASIC METABOLIC PANEL WITH GFR
BUN: 15 mg/dL (ref 6–23)
CO2: 28 meq/L (ref 19–32)
Calcium: 9.6 mg/dL (ref 8.4–10.5)
Chloride: 106 meq/L (ref 96–112)
Creatinine, Ser: 0.9 mg/dL (ref 0.40–1.20)
GFR: 74.71 mL/min (ref >60.00)
Glucose, Bld: 84 mg/dL (ref 70–99)
Potassium: 4.4 meq/L (ref 3.5–5.1)
Sodium: 142 meq/L (ref 135–145)

## 2024-03-13 LAB — MICROALBUMIN / CREATININE URINE RATIO
Creatinine,U: 58.2 mg/dL
Microalb Creat Ratio: UNDETERMINED mg/g (ref 0.0–30.0)
Microalb, Ur: <0.7 mg/dL

## 2024-03-13 LAB — CBC WITH DIFFERENTIAL/PLATELET
Basophils Absolute: 0 10*3/uL (ref 0.0–0.1)
Basophils Relative: 0.7 % (ref 0.0–3.0)
Eosinophils Absolute: 0.2 10*3/uL (ref 0.0–0.7)
Eosinophils Relative: 3.8 % (ref 0.0–5.0)
HCT: 39.5 % (ref 36.0–46.0)
Hemoglobin: 13.1 g/dL (ref 12.0–15.0)
Lymphocytes Relative: 35.3 % (ref 12.0–46.0)
Lymphs Abs: 2.2 10*3/uL (ref 0.7–4.0)
MCHC: 33.1 g/dL (ref 30.0–36.0)
MCV: 90.1 fl (ref 78.0–100.0)
Monocytes Absolute: 0.4 10*3/uL (ref 0.1–1.0)
Monocytes Relative: 6 % (ref 3.0–12.0)
Neutro Abs: 3.4 10*3/uL (ref 1.4–7.7)
Neutrophils Relative %: 54.2 % (ref 43.0–77.0)
Platelets: 284 10*3/uL (ref 150.0–400.0)
RBC: 4.39 Mil/uL (ref 3.87–5.11)
RDW: 13.8 % (ref 11.5–15.5)
WBC: 6.3 10*3/uL (ref 4.0–10.5)

## 2024-03-13 LAB — HEPATIC FUNCTION PANEL
ALT: 11 U/L (ref 0–35)
AST: 13 U/L (ref 0–37)
Albumin: 4.1 g/dL (ref 3.5–5.2)
Alkaline Phosphatase: 116 U/L (ref 39–117)
Bilirubin, Direct: 0.1 mg/dL (ref 0.0–0.3)
Total Bilirubin: 0.5 mg/dL (ref 0.2–1.2)
Total Protein: 7.2 g/dL (ref 6.0–8.3)

## 2024-03-13 LAB — VITAMIN D 25 HYDROXY (VIT D DEFICIENCY, FRACTURES): VITD: 16.6 ng/mL — ABNORMAL LOW (ref 30.00–100.00)

## 2024-03-13 LAB — TSH: TSH: 1.82 u[IU]/mL (ref 0.35–5.50)

## 2024-03-13 LAB — VITAMIN B12: Vitamin B-12: 259 pg/mL (ref 211–911)

## 2024-03-13 MED ORDER — ESCITALOPRAM OXALATE 10 MG PO TABS: 10.0000 mg | ORAL_TABLET | Freq: Every day | ORAL | 3 refills | Status: AC

## 2024-03-13 MED ORDER — SEMAGLUTIDE (1 MG/DOSE) 4 MG/3ML ~~LOC~~ SOPN
1.0000 mg | PEN_INJECTOR | SUBCUTANEOUS | 11 refills | Status: DC
  Filled 2024-05-27 – 2024-05-30 (×3): qty 3, 28d supply, fill #0
  Filled 2024-06-28: qty 3, 28d supply, fill #1
  Filled 2024-08-30 – 2024-09-03 (×2): qty 3, 28d supply, fill #2

## 2024-03-13 NOTE — Progress Notes (Signed)
 Patient ID: Nicole Melendez, female   DOB: [birth year], 48 y.o.   MRN: [id number]         Chief Complaint:: wellness exam and anxiety,depression, dm with obesity        HPI:  Nicole Melendez is a 48 y.o. female here for wellness exam; for tdap and shingrix at pharmacy, ue for mammogram,o/w up to date                        Also has had worsening anxiety depression, so has reduced her zoloft  to 100 mg as she feels is no longer working, hoping to change to different med, and also asking for weaning down or off xanax , maybe consider other med such as buspar.  Has had at least mild to mod worsening depressive symptoms, but no suicidal ideation, or panic; has ongoing anxiety.  Pt denies chest pain, increased sob or doe, wheezing, orthopnea, PND, increased LE swelling, palpitations, dizziness or syncope.   Pt denies polydipsia, polyuria, or new focal neuro s/s.    Pt denies fever, wt loss, night sweats, loss of appetite, or other constitutional symptoms   Also has uncontrolled obesity, and tolerating ozempic  0.5.    Wt Readings from Last 3 Encounters:  03/13/24 261 lb (118.4 kg)  12/27/23 258 lb (117 kg)  12/23/23 257 lb 6.4 oz (116.8 kg)   BP Readings from Last 3 Encounters:  03/13/24 126/78  12/27/23 128/72  12/23/23 115/74   Immunization History  Administered Date(s) Administered   Influenza Split 07/07/2011, 07/21/2012   Influenza, Seasonal, Injecte, Preservative Fre 07/20/2023   Influenza,inj,Quad PF,6+ Mos 01/04/2014, 07/08/2014, 07/02/2015, 06/15/2017, 07/18/2018, 07/27/2019   Influenza-Unspecified 06/15/2017   PFIZER(Purple Top)SARS-COV-2 Vaccination 01/26/2020, 02/23/2020   PNEUMOCOCCAL CONJUGATE-20 07/20/2023   Pneumococcal Conjugate-13 10/28/2015   Pneumococcal Polysaccharide-23 11/26/2015   Td 10/25/1997   Tdap 01/03/2013   Health Maintenance Due  Topic Date Due   Zoster Vaccines- Shingrix (1 of 2) Never done   DTaP/Tdap/Td (3 - Td or Tdap) 01/04/2023   MAMMOGRAM  Never done       Past Medical History:  Diagnosis Date   Allergic rhinitis, cause unspecified    Anemia    Anxiety    Arthritis    COPD (chronic obstructive pulmonary disease) (HCC)    Depression    Diabetes mellitus type II    steroid related, patient states "im no longer diabetic"   Essential hypertension 08/08/2015   GERD (gastroesophageal reflux disease)    History of blood transfusion    Hyperlipidemia    Menorrhagia    Migraine    Morbid obesity (HCC)    Otitis media 06/28/2015   Peptic ulcer disease    Pneumonia    Positive ANA (antinuclear antibody) 02/14/2012   Sarcoid    including hand per rheumatology-Dr. Ebbie Goldmann   Sarcoidosis of lung (HCC)    Shortness of breath    on exertion   Varicose veins with pain    Past Surgical History:  Procedure Laterality Date   ABDOMINAL HYSTERECTOMY     COLONOSCOPY WITH PROPOFOL  N/A 10/19/2016   Procedure: COLONOSCOPY WITH PROPOFOL ;  Surgeon: Sergio Dandy, MD;  Location: [facility];  Service: Endoscopy;  Laterality: N/A;   ESOPHAGOGASTRODUODENOSCOPY (EGD) WITH PROPOFOL  N/A 10/19/2016   Procedure: ESOPHAGOGASTRODUODENOSCOPY (EGD) WITH PROPOFOL ;  Surgeon: Sergio Dandy, MD;  Location: [facility];  Service: Endoscopy;  Laterality: N/A;   HERNIA MESH REMOVAL  02/2013   HERNIA REPAIR  KNEE ARTHROSCOPY Left 01/15/2022   Procedure: ARTHROSCOPY KNEE;  Surgeon: Winston Hawking, MD;  Location: [facility];  Service: Orthopedics;  Laterality: Left;   uterine ablation  03/2010   WISDOM TOOTH EXTRACTION      reports that she quit smoking about 11 years ago. Her smoking use included cigarettes. She started smoking about 31 years ago. She has a 20 pack-year smoking history. She has never used smokeless tobacco. She reports that she does not drink alcohol and does not use drugs. family history includes Allergies in her mother; Bone cancer in her maternal aunt; Breast cancer in her maternal aunt; Diabetes in her father and mother; Heart attack in  her mother; Heart disease in her father; Hypertension in an other family member; Lung cancer in her maternal aunt; Ovarian cancer in her maternal aunt; Rheum arthritis in her father; Stroke in her father and maternal uncle. Allergies  Allergen Reactions   Azithromycin Hives    (z pak) hives   Bee Venom Anaphylaxis   Flagyl  [Metronidazole ] Hives and Shortness Of Breath   Penicillins Shortness Of Breath and Swelling    Has patient had a PCN reaction causing immediate rash, facial/tongue/throat swelling, SOB or lightheadedness with hypotension: Yes Has patient had a PCN reaction causing severe rash involving mucus membranes or skin necrosis: No Has patient had a PCN reaction that required hospitalization No Has patient had a PCN reaction occurring within the last 10 years: No If all of the above answers are "NO", then may proceed with Cephalosporin use.  REACTION: swelling and difficulty breathing   Shellfish Allergy Anaphylaxis   Klonopin  [Clonazepam ] Other (See Comments)    Memory difficulty   Cephalexin Hives and Swelling   Cephalosporins Hives   Current Outpatient Medications on File Prior to Visit  Medication Sig Dispense Refill   acetaminophen  (TYLENOL ) 650 MG CR tablet Take 650-1,300 mg by mouth every 8 (eight) hours as needed for pain.     ALPRAZolam  (XANAX ) 0.5 MG tablet Take 1 tablet (0.5 mg total) by mouth 2 (two) times daily as needed for anxiety. 60 tablet 2   azelastine  (ASTELIN ) 0.1 % nasal spray Place 2 sprays into both nostrils 2 (two) times daily. Use in each nostril as directed 30 mL 12   baclofen  (LIORESAL ) 10 MG tablet TAKE 1 TABLET(10 MG) BY MOUTH THREE TIMES DAILY 60 tablet 1   BD PEN NEEDLE NANO 2ND GEN 32G X 4 MM MISC USE AS DIRECTED THREE TIMES DAILY 300 each 3   Blood Glucose Monitoring Suppl (ONETOUCH VERIO) w/Device KIT Use as directed daily E11.9 1 kit 0   diphenoxylate -atropine  (LOMOTIL ) 2.5-0.025 MG tablet Take 1 tablet by mouth 4 (four) times daily as needed  for diarrhea or loose stools. 30 tablet 0   gabapentin  (NEURONTIN ) 300 MG capsule TAKE 3 CAPSULES BY MOUTH THREE TIMES DAILY 270 capsule 1   Galcanezumab -gnlm (EMGALITY ) 120 MG/ML SOAJ Inject 120 mg into the skin every 28 (twenty-eight) days. 1.12 mL 5   glucose blood (ONETOUCH VERIO) test strip USE AS DIRECTED THREE TIMES DAILY E11.9 300 strip 11   insulin  aspart (NOVOLOG  FLEXPEN) 100 UNIT/ML FlexPen ADMINISTER 12 UNITS UNDER THE SKIN FOUR TIMES DAILY 15 mL 2   mometasone -formoterol  (DULERA ) 200-5 MCG/ACT AERO Inhale 2 puffs into the lungs in the morning and at bedtime. 8.8 g 5   naloxone  (NARCAN ) nasal spray 4 mg/0.1 mL SMARTSIG:1 Spray(s) Both Nares PRN     nystatin  (MYCOSTATIN ) 100000 UNIT/ML suspension Take by mouth.  ondansetron  (ZOFRAN -ODT) 4 MG disintegrating tablet Take 1 tablet (4 mg total) by mouth every 8 (eight) hours as needed. 20 tablet 0   ondansetron  (ZOFRAN -ODT) 4 MG disintegrating tablet Take 1 tablet (4 mg total) by mouth every 8 (eight) hours as needed. 20 tablet 0   oxyCODONE -acetaminophen  (PERCOCET) 10-325 MG tablet Take 1 tablet by mouth every 6 (six) hours as needed for pain. Per pain management 120 tablet 0   pantoprazole  (PROTONIX ) 40 MG tablet Take 1 tablet (40 mg total) by mouth 2 (two) times daily before a meal. 60 tablet 5   potassium chloride  SA (KLOR-CON  M) 20 MEQ tablet      Rimegepant Sulfate (NURTEC) 75 MG TBDP 1 tab by mouth once daily as needed 16 tablet 11   rizatriptan  (MAXALT -MLT) 10 MG disintegrating tablet Take 1 tablet earliest onset of migraine.  May repeat in 2 hours if needed.  Maximum 2 tablets in 24 hours 9 tablet 11   rosuvastatin  (CRESTOR ) 40 MG tablet 1 tab by mouth once daily 90 tablet 3   sucralfate  (CARAFATE ) 1 g tablet TAKE 1 TABLET(1 GRAM) BY MOUTH FOUR TIMES DAILY AND AT BEDTIME WITH MEALS 120 tablet 3   telmisartan -hydrochlorothiazide (MICARDIS  HCT) 40-12.5 MG tablet TAKE 1 TABLET BY MOUTH DAILY 90 tablet 3   triamcinolone  (NASACORT ) 55  MCG/ACT AERO nasal inhaler Place 2 sprays into the nose daily. 1 each 12   TURMERIC PO Take 1 tablet by mouth daily.     vitamin B-12 (CYANOCOBALAMIN ) 1000 MCG tablet Take 1,000 mcg by mouth daily.     Vitamin D , Ergocalciferol , (DRISDOL ) 1.25 MG (50000 UNIT) CAPS capsule Take by mouth.     XOPENEX  HFA 45 MCG/ACT inhaler INHALE 1 TO 2 PUFFS INTO THE LUNGS EVERY 8 HOURS AS NEEDED FOR WHEEZING OR SHORTNESS OF BREATH, USE 2 PUFFS 3 TIMES DAILY THEN BACK TO HOME REGIMEN 15 g 3   zonisamide  (ZONEGRAN ) 100 MG capsule Take 2 capsules (200 mg total) by mouth daily. 60 capsule 5   No current facility-administered medications on file prior to visit.        ROS:  All others reviewed and negative.  Objective        PE:  BP 126/78 (BP Location: Right Arm, Patient Position: Sitting, Cuff Size: Normal)   Pulse 75   Temp 98.5 F (36.9 C) (Oral)   Ht 5\' 6"  (1.676 m)   Wt 261 lb (118.4 kg)   LMP 07/18/2011   SpO2 99%   BMI 42.13 kg/m                 Constitutional: Pt appears in NAD               HENT: Head: NCAT.                Right Ear: External ear normal.                 Left Ear: External ear normal.                Eyes: . Pupils are equal, round, and reactive to light. Conjunctivae and EOM are normal               Nose: without d/c or deformity               Neck: Neck supple. Gross normal ROM               Cardiovascular: Normal rate and regular rhythm.  Pulmonary/Chest: Effort normal and breath sounds without rales or wheezing.                Abd:  Soft, NT, ND, + BS, no organomegaly               Neurological: Pt is alert. At baseline orientation, motor grossly intact               Skin: Skin is warm. No rashes, no other new lesions, LE edema - none               Psychiatric: Pt behavior is normal without agitation , anxiety and depressed affect  Micro: none  Cardiac tracings I have personally interpreted today:  none  Pertinent Radiological findings (summarize): none    Lab Results  Component Value Date   WBC 6.3 03/13/2024   HGB 13.1 03/13/2024   HCT 39.5 03/13/2024   PLT 284.0 03/13/2024   GLUCOSE 84 03/13/2024   CHOL 152 03/13/2024   TRIG 72.0 03/13/2024   HDL 52.10 03/13/2024   LDLDIRECT 135.0 07/05/2011   LDLCALC 86 03/13/2024   ALT 11 03/13/2024   AST 13 03/13/2024   NA 142 03/13/2024   K 4.4 03/13/2024   CL 106 03/13/2024   CREATININE 0.90 03/13/2024   BUN 15 03/13/2024   CO2 28 03/13/2024   TSH 1.82 03/13/2024   INR 1.3 11/17/2012   HGBA1C 5.6 03/13/2024   MICROALBUR <0.7 03/13/2024   Assessment/Plan:  Nicole Melendez is a 48 y.o. Black or African American [2] female with  has a past medical history of Allergic rhinitis, cause unspecified, Anemia, Anxiety, Arthritis, COPD (chronic obstructive pulmonary disease) (HCC), Depression, Diabetes mellitus type II, Essential hypertension (08/08/2015), GERD (gastroesophageal reflux disease), History of blood transfusion, Hyperlipidemia, Menorrhagia, Migraine, Morbid obesity (HCC), Otitis media (06/28/2015), Peptic ulcer disease, Pneumonia, Positive ANA (antinuclear antibody) (02/14/2012), Sarcoid, Sarcoidosis of lung (HCC), Shortness of breath, and Varicose veins with pain.  Encounter for well adult exam with abnormal findings Age and sex appropriate education and counseling updated with regular exercise and diet Referrals for preventative services - for mammogram  Immunizations addressed - for tdap and shingrix at pharmacy Smoking counseling  - none needed Evidence for depression or other mood disorder - none significant Most recent labs reviewed. I have personally reviewed and have noted: 1) the patient's medical and social history 2) The patient's current medications and supplements 3) The patient's height, weight, and BMI have been recorded in the chart   Diabetes mellitus with neuropathy [facility]) Lab Results  Component Value Date   HGBA1C 5.6 03/13/2024   With uncontrolled obesity,  pt to continue current medical treatment novolog  1 u qid, but for increased ozempic  1 mg weekly   Hyperlipidemia Lab Results  Component Value Date   LDLCALC 86 03/13/2024   Uncontrolled,  pt to continue current statin crestor  40 mg every day, declines add zetia    Anxiety state Ok to begin xanax  wean by taking half 0.5 mg tab bid x 2 wks, then 1 per day for 2 wks, then stop or keep as prn panic only in future, or consider change to buspar  Depression Pt already started weaning down to 100 mg currently for last 2 wks, now for 1/2 tab for 2 wks and stop; then start Lexapro 10 qd  Essential hypertension BP Readings from Last 3 Encounters:  03/13/24 126/78  12/27/23 128/72  12/23/23 115/74   Stable, pt to continue medical treatment micardis  hct 40  12.5 qd   Vitamin D  deficiency Last vitamin D  Lab Results  Component Value Date   VD25OH 16.60 (L) 03/13/2024   Low, to start oral replacement  Followup: Return in about 6 months (around 09/13/2024).  Rosalia Colonel, MD 03/14/2024 9:11 PM [facility]

## 2024-03-13 NOTE — Progress Notes (Signed)
 The test results show that your current treatment is OK, as the tests are stable.  Please continue the same plan.  There is no other need for change of treatment or further evaluation based on these results, at this time.  thanks

## 2024-03-13 NOTE — Patient Instructions (Addendum)
 Please have your Shingrix (shingles) shots done at your local pharmacy., as well as the Tdap tetanus shot  Ok to take HALF zoloft  100 mg (which is 50 mg) for 3 wks, then:  Please take all new medication as prescribed - the lexapro 10 mg per day  Ok to wean down on the xanax  by half for 2 -3 wks, then once per day for 2-3 wks, then take as needed only  Please continue all other medications as before, and refills have been done if requested.  Please have the pharmacy call with any other refills you may need.  Please continue your efforts at being more active, low cholesterol diet, and weight control.  You are otherwise up to date with prevention measures today.  Please keep your appointments with your specialists as you may have planned  You will be contacted regarding the referral for: mammogram  Please go to the LAB at the blood drawing area for the tests to be done  You will be contacted by phone if any changes need to be made immediately.  Otherwise, you will receive a letter about your results with an explanation, but please check with MyChart first.  Please make an Appointment to return in 6 months, or sooner if needed

## 2024-03-14 ENCOUNTER — Encounter: Payer: Self-pay | Admitting: Internal Medicine

## 2024-03-14 NOTE — Assessment & Plan Note (Signed)
 Lab Results  Component Value Date   LDLCALC 86 03/13/2024   Uncontrolled,  pt to continue current statin crestor  40 mg every day, declines add zetia

## 2024-03-14 NOTE — Assessment & Plan Note (Signed)
 Lab Results  Component Value Date   HGBA1C 5.6 03/13/2024   With uncontrolled obesity, pt to continue current medical treatment novolog  1 u qid, but for increased ozempic  1 mg weekly

## 2024-03-14 NOTE — Assessment & Plan Note (Signed)
 Ok to begin xanax  wean by taking half 0.5 mg tab bid x 2 wks, then 1 per day for 2 wks, then stop or keep as prn panic only in future, or consider change to buspar

## 2024-03-14 NOTE — Assessment & Plan Note (Signed)
 Last vitamin D  Lab Results  Component Value Date   VD25OH 16.60 (L) 03/13/2024   Low, to start oral replacement

## 2024-03-14 NOTE — Assessment & Plan Note (Signed)
 Pt already started weaning down to 100 mg currently for last 2 wks, now for 1/2 tab for 2 wks and stop; then start Lexapro 10 qd

## 2024-03-14 NOTE — Assessment & Plan Note (Signed)
 BP Readings from Last 3 Encounters:  03/13/24 126/78  12/27/23 128/72  12/23/23 115/74   Stable, pt to continue medical treatment micardis  hct 40 12.5 qd

## 2024-03-14 NOTE — Assessment & Plan Note (Addendum)
 Age and sex appropriate education and counseling updated with regular exercise and diet Referrals for preventative services - for mammogram  Immunizations addressed - for tdap and shingrix at pharmacy Smoking counseling  - none needed Evidence for depression or other mood disorder - none significant Most recent labs reviewed. I have personally reviewed and have noted: 1) the patient's medical and social history 2) The patient's current medications and supplements 3) The patient's height, weight, and BMI have been recorded in the chart

## 2024-03-20 ENCOUNTER — Encounter: Payer: Self-pay | Admitting: Internal Medicine

## 2024-03-21 ENCOUNTER — Telehealth: Payer: Self-pay | Admitting: Internal Medicine

## 2024-03-21 NOTE — Telephone Encounter (Signed)
 Copied from CRM [id number]. Topic: General - Other >> Mar 21, 2024  9:14 AM Bambi Bonine D wrote: Reason for CRM: Pt stated that she will be in the office today to bring in medical wellness forms to be signed.

## 2024-03-22 NOTE — Telephone Encounter (Signed)
 Placed on provider desk

## 2024-03-26 ENCOUNTER — Encounter (INDEPENDENT_AMBULATORY_CARE_PROVIDER_SITE_OTHER): Payer: Self-pay | Admitting: Otolaryngology

## 2024-03-26 ENCOUNTER — Ambulatory Visit (INDEPENDENT_AMBULATORY_CARE_PROVIDER_SITE_OTHER): Admitting: Otolaryngology

## 2024-03-26 VITALS — BP 102/71 | HR 95 | Ht 66.0 in | Wt 247.0 lb

## 2024-03-26 DIAGNOSIS — R448 Other symptoms and signs involving general sensations and perceptions: Secondary | ICD-10-CM

## 2024-03-26 DIAGNOSIS — R0981 Nasal congestion: Secondary | ICD-10-CM | POA: Diagnosis not present

## 2024-03-26 DIAGNOSIS — J3489 Other specified disorders of nose and nasal sinuses: Secondary | ICD-10-CM | POA: Diagnosis not present

## 2024-03-26 DIAGNOSIS — H6992 Unspecified Eustachian tube disorder, left ear: Secondary | ICD-10-CM

## 2024-03-26 DIAGNOSIS — J343 Hypertrophy of nasal turbinates: Secondary | ICD-10-CM | POA: Diagnosis not present

## 2024-03-26 DIAGNOSIS — J342 Deviated nasal septum: Secondary | ICD-10-CM

## 2024-03-26 MED ORDER — FLUTICASONE PROPIONATE 50 MCG/ACT NA SUSP: 2.0000 | Freq: Every day | NASAL | 6 refills | Status: DC

## 2024-03-26 MED ORDER — MUPIROCIN 2 % EX OINT: 1.0000 | TOPICAL_OINTMENT | Freq: Two times a day (BID) | CUTANEOUS | 0 refills | Status: AC

## 2024-03-26 MED ORDER — AZELASTINE HCL 0.1 % NA SOLN: 2.0000 | Freq: Two times a day (BID) | NASAL | 12 refills | Status: AC

## 2024-03-26 NOTE — Progress Notes (Signed)
 Dear Dr. Baldwin Levee, Here is my assessment for our mutual patient, Nicole Melendez. Thank you for allowing me the opportunity to care for your patient. Please do not hesitate to contact me should you have any other questions. Sincerely, Dr. Milon Aloe  Otolaryngology Clinic Note  HISTORY: Nicole Melendez is a 48 y.o. female kindly referred by Dr. Baldwin Levee for evaluation of concern for sinusitis, ear pressure, and potential nasal lesion.   Initial visit (02/2024): She reports that has left ear discomfort and pressure, and fullness ("feels like water in the ear"). Can't get ear to pop. She also reports that she has bilateral maxillary sinus pressure and feels like there is a nasal lesion on the nasal sill. Ongoing for about 6 months after she had a URI. She does clean her nose with warm water, and blows her nose fairly significantly. She has tried nasal saline gel and this resolves the nasal lesion. No discolored drainage from nose, or hyposmia. No history of frequent sinus infections. She does have sarcoidosis but this is well controlled. Some nasal congestion.  Does report history of allergies and uses PO anthistamine, flonase  and astelin  -- prior. She is not currently using sprays because she wanted to make sure that was not causing her symptoms.   No previous sinonasal surgery.  She is currently using nasal saline gel and PO antihistamine. She has not tried nasal saline rinses.   GLP-1: Ozempic  AP/AC: no  Tobacco: prior, quit  PMHx: Sarcoidosis, Obstructive Pulm disease, Migraines, DM, GERD, HTN, HLD, Anxiety, Depression  RADIOGRAPHIC EVALUATION AND INDEPENDENT REVIEW OF OTHER RECORDS:: Dr. Baldwin Levee (Pulm) 12/23/2023: noted URI, Gastroenteritis with congestio nand drainage, throat clearing; holding flonase  and astelin ; Dx: AR, nasal irritation; ref to ENT and nasal saline gel; on dulera  CBC and BMP 03/13/2024: WBC 6.3, Hgb 13.1, Plt 284, Eos 200; BUN/Cr 15/0.9  Past Medical History:  Diagnosis Date    Allergic rhinitis, cause unspecified    Anemia    Anxiety    Arthritis    COPD (chronic obstructive pulmonary disease) (HCC)    Depression    Diabetes mellitus type II    steroid related, patient states "im no longer diabetic"   Essential hypertension 08/08/2015   GERD (gastroesophageal reflux disease)    History of blood transfusion    Hyperlipidemia    Menorrhagia    Migraine    Morbid obesity (HCC)    Otitis media 06/28/2015   Peptic ulcer disease    Pneumonia    Positive ANA (antinuclear antibody) 02/14/2012   Sarcoid    including hand per rheumatology-Dr. Ebbie Goldmann   Sarcoidosis of lung (HCC)    Shortness of breath    on exertion   Varicose veins with pain    Past Surgical History:  Procedure Laterality Date   ABDOMINAL HYSTERECTOMY     COLONOSCOPY WITH PROPOFOL  N/A 10/19/2016   Procedure: COLONOSCOPY WITH PROPOFOL ;  Surgeon: Sergio Dandy, MD;  Location: [facility];  Service: Endoscopy;  Laterality: N/A;   ESOPHAGOGASTRODUODENOSCOPY (EGD) WITH PROPOFOL  N/A 10/19/2016   Procedure: ESOPHAGOGASTRODUODENOSCOPY (EGD) WITH PROPOFOL ;  Surgeon: Sergio Dandy, MD;  Location: [facility];  Service: Endoscopy;  Laterality: N/A;   HERNIA MESH REMOVAL  02/2013   HERNIA REPAIR     KNEE ARTHROSCOPY Left 01/15/2022   Procedure: ARTHROSCOPY KNEE;  Surgeon: Winston Hawking, MD;  Location: [facility];  Service: Orthopedics;  Laterality: Left;   uterine ablation  03/2010   WISDOM TOOTH EXTRACTION     Family History  Problem Relation  Age of Onset   Allergies Mother    Heart attack Mother    Diabetes Mother    Diabetes Father    Heart disease Father    Rheum arthritis Father    Stroke Father    Hypertension Other    Ovarian cancer Maternal Aunt    Lung cancer Maternal Aunt    Breast cancer Maternal Aunt    Bone cancer Maternal Aunt    Stroke Maternal Uncle    Other Neg Hx    Social History   Tobacco Use   Smoking status: Former    Current packs/day: 0.00     Average packs/day: 1 pack/day for 20.0 years (20.0 ttl pk-yrs)    Types: Cigarettes    Start date: 10/25/1992    Quit date: 10/25/2012    Years since quitting: 11.4   Smokeless tobacco: Never   Tobacco comments:    QUIT 04/2010 AND STARTED BACK 2014 X 3 MONTHS. less than 1 ppd.  started at age 27.    Substance Use Topics   Alcohol use: No   Allergies  Allergen Reactions   Azithromycin Hives    (z pak) hives   Bee Venom Anaphylaxis   Flagyl  [Metronidazole ] Hives and Shortness Of Breath   Penicillins Shortness Of Breath and Swelling    Has patient had a PCN reaction causing immediate rash, facial/tongue/throat swelling, SOB or lightheadedness with hypotension: Yes Has patient had a PCN reaction causing severe rash involving mucus membranes or skin necrosis: No Has patient had a PCN reaction that required hospitalization No Has patient had a PCN reaction occurring within the last 10 years: No If all of the above answers are "NO", then may proceed with Cephalosporin use.  REACTION: swelling and difficulty breathing   Shellfish Allergy Anaphylaxis   Klonopin  [Clonazepam ] Other (See Comments)    Memory difficulty   Cephalexin Hives and Swelling   Cephalosporins Hives   Current Outpatient Medications  Medication Sig Dispense Refill   acetaminophen  (TYLENOL ) 650 MG CR tablet Take 650-1,300 mg by mouth every 8 (eight) hours as needed for pain.     ALPRAZolam  (XANAX ) 0.5 MG tablet Take 1 tablet (0.5 mg total) by mouth 2 (two) times daily as needed for anxiety. 60 tablet 2   baclofen  (LIORESAL ) 10 MG tablet TAKE 1 TABLET(10 MG) BY MOUTH THREE TIMES DAILY 60 tablet 1   BD PEN NEEDLE NANO 2ND GEN 32G X 4 MM MISC USE AS DIRECTED THREE TIMES DAILY 300 each 3   Blood Glucose Monitoring Suppl (ONETOUCH VERIO) w/Device KIT Use as directed daily E11.9 1 kit 0   diphenoxylate -atropine  (LOMOTIL ) 2.5-0.025 MG tablet Take 1 tablet by mouth 4 (four) times daily as needed for diarrhea or loose stools. 30  tablet 0   escitalopram  (LEXAPRO ) 10 MG tablet Take 1 tablet (10 mg total) by mouth daily. 90 tablet 3   fluticasone  (FLONASE ) 50 MCG/ACT nasal spray Place 2 sprays into both nostrils daily. 16 g 6   gabapentin  (NEURONTIN ) 300 MG capsule TAKE 3 CAPSULES BY MOUTH THREE TIMES DAILY 270 capsule 1   Galcanezumab -gnlm (EMGALITY ) 120 MG/ML SOAJ Inject 120 mg into the skin every 28 (twenty-eight) days. 1.12 mL 5   glucose blood (ONETOUCH VERIO) test strip USE AS DIRECTED THREE TIMES DAILY E11.9 300 strip 11   insulin  aspart (NOVOLOG  FLEXPEN) 100 UNIT/ML FlexPen ADMINISTER 12 UNITS UNDER THE SKIN FOUR TIMES DAILY 15 mL 2   mometasone -formoterol  (DULERA ) 200-5 MCG/ACT AERO Inhale 2 puffs into the lungs  in the morning and at bedtime. 8.8 g 5   mupirocin  ointment (BACTROBAN ) 2 % Apply 1 Application topically 2 (two) times daily for 7 days. Put in nose twice daily for 7 days 22 g 0   naloxone  (NARCAN ) nasal spray 4 mg/0.1 mL SMARTSIG:1 Spray(s) Both Nares PRN     nystatin  (MYCOSTATIN ) 100000 UNIT/ML suspension Take by mouth.     ondansetron  (ZOFRAN -ODT) 4 MG disintegrating tablet Take 1 tablet (4 mg total) by mouth every 8 (eight) hours as needed. 20 tablet 0   ondansetron  (ZOFRAN -ODT) 4 MG disintegrating tablet Take 1 tablet (4 mg total) by mouth every 8 (eight) hours as needed. 20 tablet 0   oxyCODONE -acetaminophen  (PERCOCET) 10-325 MG tablet Take 1 tablet by mouth every 6 (six) hours as needed for pain. Per pain management 120 tablet 0   pantoprazole  (PROTONIX ) 40 MG tablet Take 1 tablet (40 mg total) by mouth 2 (two) times daily before a meal. 60 tablet 5   potassium chloride  SA (KLOR-CON  M) 20 MEQ tablet      Rimegepant Sulfate (NURTEC) 75 MG TBDP 1 tab by mouth once daily as needed 16 tablet 11   rizatriptan  (MAXALT -MLT) 10 MG disintegrating tablet Take 1 tablet earliest onset of migraine.  May repeat in 2 hours if needed.  Maximum 2 tablets in 24 hours 9 tablet 11   rosuvastatin  (CRESTOR ) 40 MG tablet 1  tab by mouth once daily 90 tablet 3   Semaglutide , 1 MG/DOSE, 4 MG/3ML SOPN Inject 1 mg as directed once a week. 3 mL 11   sucralfate  (CARAFATE ) 1 g tablet TAKE 1 TABLET(1 GRAM) BY MOUTH FOUR TIMES DAILY AND AT BEDTIME WITH MEALS 120 tablet 3   telmisartan -hydrochlorothiazide (MICARDIS  HCT) 40-12.5 MG tablet TAKE 1 TABLET BY MOUTH DAILY 90 tablet 3   triamcinolone  (NASACORT ) 55 MCG/ACT AERO nasal inhaler Place 2 sprays into the nose daily. 1 each 12   TURMERIC PO Take 1 tablet by mouth daily.     vitamin B-12 (CYANOCOBALAMIN ) 1000 MCG tablet Take 1,000 mcg by mouth daily.     Vitamin D , Ergocalciferol , (DRISDOL ) 1.25 MG (50000 UNIT) CAPS capsule Take by mouth.     XOPENEX  HFA 45 MCG/ACT inhaler INHALE 1 TO 2 PUFFS INTO THE LUNGS EVERY 8 HOURS AS NEEDED FOR WHEEZING OR SHORTNESS OF BREATH, USE 2 PUFFS 3 TIMES DAILY THEN BACK TO HOME REGIMEN 15 g 3   zonisamide  (ZONEGRAN ) 100 MG capsule Take 2 capsules (200 mg total) by mouth daily. 60 capsule 5   azelastine  (ASTELIN ) 0.1 % nasal spray Place 2 sprays into both nostrils 2 (two) times daily. Use in each nostril as directed 30 mL 12   No current facility-administered medications for this visit.   BP 102/71 (BP Location: Left Wrist, Patient Position: Sitting, Cuff Size: Large)   Pulse 95   Ht 5\' 6"  (1.676 m)   Wt 247 lb (112 kg)   LMP 07/18/2011   SpO2 96%   BMI 39.87 kg/m   PHYSICAL EXAM:  BP 102/71 (BP Location: Left Wrist, Patient Position: Sitting, Cuff Size: Large)   Pulse 95   Ht 5\' 6"  (1.676 m)   Wt 247 lb (112 kg)   LMP 07/18/2011   SpO2 96%   BMI 39.87 kg/m    Salient findings:  CN II-XII intact Biilateral EAC clear and TM intact with well pneumatized middle ear spaces Nose: Anterior rhinoscopy reveals septum dev left, bilateral ITH. No noted nasal sill or intranasal lesion. Nasal endoscopy  was indicated to better evaluate the nose and paranasal sinuses, given the patient's history and exam findings, and is detailed below. No  lesions of oral cavity/oropharynx; edentulous; no oral lesions noted No obviously palpable neck masses/lymphadenopathy/thyromegaly No respiratory distress or stridor  PROCEDURE:  Prior to initiating any procedures, risks/benefits/alternatives were explained to the patient and verbal consent obtained. Diagnostic Nasal Endoscopy Pre-procedure diagnosis: Concern for chronic sinusitis, nasal congesiton Post-procedure diagnosis: same Indication: See pre-procedure diagnosis and physical exam above Complications: None apparent EBL: 0 mL Anesthesia: Lidocaine  4% and topical decongestant was topically sprayed in each nasal cavity  Description of Procedure:  Patient was identified. A rigid 30 degree endoscope was utilized to evaluate the sinonasal cavities, mucosa, sinus ostia and turbinates and septum.  Overall, signs of mucosal inflammation are minimal but some global edema noted.  No significant lumpy bumpy mucosa.  No mucopurulence, polyps, or masses noted.   Right Middle meatus: clear Right SE Recess: clear Left MM: clear Left SE Recess: clear No masses over ET       CPT CODE -- 31231 - Mod 25   ASSESSMENT:  48 y.o. with sarcoidosis now with:  1. Dysfunction of left eustachian tube   2. Nasal congestion   3. Hypertrophy of both inferior nasal turbinates   4. Nasal septal deviation   5. Nasal vestibulitis    Multiple issues but wonder if allergies and ETD causing her symptoms. She was on nasal sprays prior and this helped but has stopped them. Will re-start. No h/o frequent sinus infections - Start flonase  and astelin  BID  In addition, unclear what nasal lesions she is having since it resolves with saline gel (excoriation from blowing and cleaning) v/s vestibulitis? Exam is reassuring. Will start mupirocin  BID x7d in case vestibulitis and then saline gel which has been working for her Mupirocin  BID, then saline gel  F/u 2 months -- sooner as needed  See below regarding exact  medications prescribed this encounter including dosages and route: Meds ordered this encounter  Medications   fluticasone  (FLONASE ) 50 MCG/ACT nasal spray    Sig: Place 2 sprays into both nostrils daily.    Dispense:  16 g    Refill:  6   azelastine  (ASTELIN ) 0.1 % nasal spray    Sig: Place 2 sprays into both nostrils 2 (two) times daily. Use in each nostril as directed    Dispense:  30 mL    Refill:  12   mupirocin  ointment (BACTROBAN ) 2 %    Sig: Apply 1 Application topically 2 (two) times daily for 7 days. Put in nose twice daily for 7 days    Dispense:  22 g    Refill:  0     Thank you for allowing me the opportunity to care for your patient. Please do not hesitate to contact me should you have any other questions.  Sincerely, Milon Aloe, MD Otolaryngologist (ENT), [facility] Phone: [phone] Fax: [phone]  MDM:  Level 4: [phone] Complexity/Problems addressed: mod - multiple chronic issues Data complexity: mod - independent review of notes, labs - Morbidity: mod  - Drug prescribed or managed: y  03/26/2024, 11:22 AM

## 2024-03-26 NOTE — Telephone Encounter (Signed)
 Called and left voicemail letting Pt know the form is ready for pickup.

## 2024-03-29 DIAGNOSIS — M51369 Other intervertebral disc degeneration, lumbar region without mention of lumbar back pain or lower extremity pain: Secondary | ICD-10-CM | POA: Diagnosis not present

## 2024-03-29 DIAGNOSIS — M5030 Other cervical disc degeneration, unspecified cervical region: Secondary | ICD-10-CM | POA: Diagnosis not present

## 2024-03-29 DIAGNOSIS — Z79899 Other long term (current) drug therapy: Secondary | ICD-10-CM | POA: Diagnosis not present

## 2024-03-29 DIAGNOSIS — M539 Dorsopathy, unspecified: Secondary | ICD-10-CM | POA: Diagnosis not present

## 2024-03-29 DIAGNOSIS — M7061 Trochanteric bursitis, right hip: Secondary | ICD-10-CM | POA: Diagnosis not present

## 2024-03-29 DIAGNOSIS — Z6839 Body mass index (BMI) 39.0-39.9, adult: Secondary | ICD-10-CM | POA: Diagnosis not present

## 2024-04-03 ENCOUNTER — Ambulatory Visit

## 2024-04-03 ENCOUNTER — Other Ambulatory Visit: Payer: Self-pay | Admitting: Internal Medicine

## 2024-04-05 NOTE — Telephone Encounter (Signed)
 Schedule follow-up appointment with Dr. Autry Legions

## 2024-04-10 ENCOUNTER — Ambulatory Visit
Admission: EM | Admit: 2024-04-10 | Discharge: 2024-04-10 | Disposition: A | Discharge status: Home / Self Care | Attending: Internal Medicine | Admitting: Internal Medicine

## 2024-04-10 ENCOUNTER — Encounter: Payer: Self-pay | Admitting: Emergency Medicine

## 2024-04-10 ENCOUNTER — Ambulatory Visit

## 2024-04-10 DIAGNOSIS — H1032 Unspecified acute conjunctivitis, left eye: Secondary | ICD-10-CM

## 2024-04-10 DIAGNOSIS — Z973 Presence of spectacles and contact lenses: Secondary | ICD-10-CM

## 2024-04-10 MED ORDER — MOXIFLOXACIN HCL 0.5 % OP SOLN: 1.0000 [drp] | Freq: Three times a day (TID) | OPHTHALMIC | 0 refills | Status: AC

## 2024-04-10 NOTE — ED Provider Notes (Signed)
 [facility]    CSN: [id number] Arrival date & time: 04/10/24  1103      History   Chief Complaint Chief Complaint  Patient presents with   Eye Problem    HPI Nicole Melendez is a 48 y.o. female.   Nicole Melendez is a 48 y.o. female presenting for chief complaint of left eye redness, itching, and crusty drainage that started 4 days ago. She wears contacts and states she felt a slimy film over the contact lens when she took it out upon symptom onset 3-4 days ago. She has been wearing her backup glasses ever since. Reports crusty drainage from the left eye, eye redness, and dull pain with mild light sensitivity to the left eye. Denies watery drainage, visual disturbance, dizziness, ear pain, fever, chills, sore throat, cough/viral uri symptoms, and recent injuries to the left eye to her knowledge. She put OTC lubricating drops to the left eye without relief.    Eye Problem   Past Medical History:  Diagnosis Date   Allergic rhinitis, cause unspecified    Anemia    Anxiety    Arthritis    COPD (chronic obstructive pulmonary disease) (HCC)    Depression    Diabetes mellitus type II    steroid related, patient states im no longer diabetic   Essential hypertension 08/08/2015   GERD (gastroesophageal reflux disease)    History of blood transfusion    Hyperlipidemia    Menorrhagia    Migraine    Morbid obesity (HCC)    Otitis media 06/28/2015   Peptic ulcer disease    Pneumonia    Positive ANA (antinuclear antibody) 02/14/2012   Sarcoid    including hand per rheumatology-Dr. Ebbie Goldmann   Sarcoidosis of lung (HCC)    Shortness of breath    on exertion   Varicose veins with pain     Patient Active Problem List   Diagnosis Date Noted   AGE (acute gastroenteritis) 12/10/2023   Blurred vision, bilateral 09/17/2023   Dyspepsia 08/17/2023   Acute recurrent pancreatitis 08/01/2023   Left ear pain 07/23/2023   OSA (obstructive sleep apnea) 06/17/2023    Constipation 03/23/2023   Nail disorder 12/25/2022   Cellulitis of great toe 12/22/2022   Acute upper respiratory infection 09/06/2022   Vitamin D  deficiency 10/21/2020   Right low back pain 10/15/2020   Recurrent falls while walking 10/15/2020   COVID-19 virus infection 09/28/2020   Exposure to COVID-19 virus 07/10/2020   Viral illness 07/10/2020   Sinusitis 05/23/2020   Allergic conjunctivitis 05/23/2020   Yeast vaginitis 05/23/2020   Polyarthralgia 02/20/2020   Tibialis posterior tendon tear, nontraumatic, right 02/08/2020   Neck pain 12/14/2019   COPD (chronic obstructive pulmonary disease) (HCC) 11/07/2019   Suspected COVID-19 virus infection 08/02/2019   Hematochezia 04/05/2019   Snoring 11/17/2018   Eye symptom 08/03/2018   Right lumbar radiculopathy 05/08/2018   Chronic pain 10/11/2017   Sarcoidosis of lung (HCC)    Acute on chronic diastolic CHF (congestive heart failure) (HCC) 09/22/2017   Rheumatoid arthritis (HCC) 09/18/2017   Chronic diastolic CHF (congestive heart failure) (HCC) 09/18/2017   Pancreatitis 06/26/2017   Right ankle sprain 06/26/2017   Possible exposure to STD 06/15/2017   Diarrhea 04/10/2017   Bilateral hand swelling 04/07/2017   Abdominal pain 04/07/2017   Abnormality of gait 01/26/2017   Avulsion fracture of right ankle 12/01/2016   Ulcer of esophagus with bleeding    Gastritis and gastroduodenitis    Blepharitis of  right upper eyelid 10/09/2016   Peroneal tendinitis of lower leg, right 10/06/2016   Right ankle pain 09/16/2016   Dizziness 03/11/2016   Mixed headache 03/09/2016   Chronic venous insufficiency 02/24/2016   Gastroenteritis 12/30/2015   Headache 12/30/2015   Epistaxis 12/09/2015   Post concussive syndrome 12/03/2015   Cervical muscle strain 12/03/2015   Right-sided face pain 11/26/2015   Multiple contusions 11/26/2015   Neck pain, bilateral posterior 11/26/2015   Low back pain 11/26/2015   Spinal stenosis of lumbar region  10/28/2015   Chronic sciatica of left side 10/28/2015   DOE (dyspnea on exertion) 09/02/2015   Essential hypertension 08/08/2015   Diaphoresis 08/07/2015   Cough 07/02/2015   Vaginitis and vulvovaginitis 06/29/2015   Otitis media 06/28/2015   Thrush 06/24/2015   Nausea & vomiting 06/10/2015   Wheezing 11/14/2014   Angioedema 03/19/2014   Vaginitis 11/19/2012   Smoker 11/19/2012   Positive ANA (antinuclear antibody) 02/14/2012   Polyarthritis 02/11/2012   Migraine 04/05/2011   Encounter for well adult exam with abnormal findings 02/21/2011   Allergic rhinitis 02/17/2011   URTICARIA 09/29/2010   Depression 09/14/2010   Diabetes mellitus with neuropathy (HCC) 07/31/2010   PERIPHERAL EDEMA 07/31/2010   INSOMNIA-SLEEP DISORDER-UNSPEC 06/19/2010   MENORRHAGIA 05/15/2010   Hyperlipidemia 12/17/2009   Iron deficiency anemia 12/17/2009   Anxiety state 12/17/2009   Morbid obesity (HCC) 07/02/2007   Gastroesophageal reflux disease 07/02/2007   PEPTIC ULCER DISEASE 07/02/2007    Past Surgical History:  Procedure Laterality Date   ABDOMINAL HYSTERECTOMY     COLONOSCOPY WITH PROPOFOL  N/A 10/19/2016   Procedure: COLONOSCOPY WITH PROPOFOL ;  Surgeon: Sergio Dandy, MD;  Location: [facility];  Service: Endoscopy;  Laterality: N/A;   ESOPHAGOGASTRODUODENOSCOPY (EGD) WITH PROPOFOL  N/A 10/19/2016   Procedure: ESOPHAGOGASTRODUODENOSCOPY (EGD) WITH PROPOFOL ;  Surgeon: Sergio Dandy, MD;  Location: [facility];  Service: Endoscopy;  Laterality: N/A;   HERNIA MESH REMOVAL  02/2013   HERNIA REPAIR     KNEE ARTHROSCOPY Left 01/15/2022   Procedure: ARTHROSCOPY KNEE;  Surgeon: Winston Hawking, MD;  Location: [facility];  Service: Orthopedics;  Laterality: Left;   uterine ablation  03/2010   WISDOM TOOTH EXTRACTION      OB History   No obstetric history on file.      Home Medications    Prior to Admission medications   Medication Sig Start Date End Date Taking? Authorizing Provider   moxifloxacin (VIGAMOX) 0.5 % ophthalmic solution Place 1 drop into the left eye 3 (three) times daily for 7 days. 04/10/24 04/17/24 Yes StanhopeDanny Dye, FNP  acetaminophen  (TYLENOL ) 650 MG CR tablet Take 650-1,300 mg by mouth every 8 (eight) hours as needed for pain.    [provider]  ALPRAZolam  (XANAX ) 0.5 MG tablet TAKE 1 TABLET(0.5 MG) BY MOUTH TWICE DAILY AS NEEDED FOR ANXIETY 04/05/24   Plotnikov, Aleksei V, MD  azelastine  (ASTELIN ) 0.1 % nasal spray Place 2 sprays into both nostrils 2 (two) times daily. Use in each nostril as directed 03/26/24   Evelina Hippo, MD  baclofen  (LIORESAL ) 10 MG tablet TAKE 1 TABLET(10 MG) BY MOUTH THREE TIMES DAILY 12/02/23   Roslyn Coombe, MD  BD PEN NEEDLE NANO 2ND GEN 32G X 4 MM MISC USE AS DIRECTED THREE TIMES DAILY 03/17/21   Roslyn Coombe, MD  Blood Glucose Monitoring Suppl [facility] VERIO) w/Device KIT Use as directed daily E11.9 03/25/23   Roslyn Coombe, MD  diphenoxylate -atropine  (LOMOTIL ) 2.5-0.025 MG tablet Take  1 tablet by mouth 4 (four) times daily as needed for diarrhea or loose stools. 12/07/23   Roslyn Coombe, MD  escitalopram  (LEXAPRO ) 10 MG tablet Take 1 tablet (10 mg total) by mouth daily. 03/13/24 03/13/25  Roslyn Coombe, MD  fluticasone  (FLONASE ) 50 MCG/ACT nasal spray Place 2 sprays into both nostrils daily. 03/26/24   Evelina Hippo, MD  gabapentin  (NEURONTIN ) 300 MG capsule TAKE 3 CAPSULES BY MOUTH THREE TIMES DAILY 01/02/24   Roslyn Coombe, MD  Galcanezumab -gnlm (EMGALITY ) 120 MG/ML SOAJ Inject 120 mg into the skin every 28 (twenty-eight) days. 02/14/24   Merriam Abbey, DO  glucose blood (ONETOUCH VERIO) test strip USE AS DIRECTED THREE TIMES DAILY E11.9 03/22/23   Roslyn Coombe, MD  insulin  aspart (NOVOLOG  FLEXPEN) 100 UNIT/ML FlexPen ADMINISTER 12 UNITS UNDER THE SKIN FOUR TIMES DAILY 03/22/23   Roslyn Coombe, MD  mometasone -formoterol  (DULERA ) 200-5 MCG/ACT AERO Inhale 2 puffs into the lungs in the morning and at bedtime. 05/11/23    Denson Flake, MD  naloxone  (NARCAN ) nasal spray 4 mg/0.1 mL SMARTSIG:1 Spray(s) Both Nares PRN 01/27/24   [provider]  nystatin  (MYCOSTATIN ) 100000 UNIT/ML suspension Take by mouth. 02/22/24   [provider]  ondansetron  (ZOFRAN -ODT) 4 MG disintegrating tablet Take 1 tablet (4 mg total) by mouth every 8 (eight) hours as needed. 07/26/23   Rosealee Concha, MD  ondansetron  (ZOFRAN -ODT) 4 MG disintegrating tablet Take 1 tablet (4 mg total) by mouth every 8 (eight) hours as needed. Patient not taking: Reported on 04/10/2024 11/22/23   Sueellen Emery, MD  oxyCODONE -acetaminophen  (PERCOCET) 10-325 MG tablet Take 1 tablet by mouth every 6 (six) hours as needed for pain. Per pain management 09/14/23 09/13/24  Roslyn Coombe, MD  pantoprazole  (PROTONIX ) 40 MG tablet Take 1 tablet (40 mg total) by mouth 2 (two) times daily before a meal. 08/17/23 08/16/24  Roslyn Coombe, MD  potassium chloride  SA (KLOR-CON  M) 20 MEQ tablet  12/28/23   [provider]  Rimegepant Sulfate (NURTEC) 75 MG TBDP 1 tab by mouth once daily as needed 09/14/23   Roslyn Coombe, MD  rizatriptan  (MAXALT -MLT) 10 MG disintegrating tablet Take 1 tablet earliest onset of migraine.  May repeat in 2 hours if needed.  Maximum 2 tablets in 24 hours 12/27/22   Merriam Abbey, DO  rosuvastatin  (CRESTOR ) 40 MG tablet 1 tab by mouth once daily 04/21/23   John, James W, MD  Semaglutide , 1 MG/DOSE, 4 MG/3ML SOPN Inject 1 mg as directed once a week. 03/13/24   Roslyn Coombe, MD  sucralfate  (CARAFATE ) 1 g tablet TAKE 1 TABLET(1 GRAM) BY MOUTH FOUR TIMES DAILY AND AT BEDTIME WITH MEALS 09/20/23   Roslyn Coombe, MD  telmisartan -hydrochlorothiazide (MICARDIS  HCT) 40-12.5 MG tablet TAKE 1 TABLET BY MOUTH DAILY 09/19/23   Roslyn Coombe, MD  triamcinolone  (NASACORT ) 55 MCG/ACT AERO nasal inhaler Place 2 sprays into the nose daily. 08/17/23   Roslyn Coombe, MD  TURMERIC PO Take 1 tablet by mouth daily.    [provider]  vitamin  B-12 (CYANOCOBALAMIN ) 1000 MCG tablet Take 1,000 mcg by mouth daily.    [provider]  Vitamin D , Ergocalciferol , (DRISDOL ) 1.25 MG (50000 UNIT) CAPS capsule Take by mouth. 02/08/20   [provider]  XOPENEX  HFA 45 MCG/ACT inhaler INHALE 1 TO 2 PUFFS INTO THE LUNGS EVERY 8 HOURS AS NEEDED FOR WHEEZING OR SHORTNESS OF BREATH, USE 2 PUFFS 3 TIMES  DAILY THEN BACK TO HOME REGIMEN 03/01/24   Denson Flake, MD  zonisamide  (ZONEGRAN ) 100 MG capsule Take 2 capsules (200 mg total) by mouth daily. 12/27/23   Merriam Abbey, DO    Family History Family History  Problem Relation Age of Onset   Allergies Mother    Heart attack Mother    Diabetes Mother    Diabetes Father    Heart disease Father    Rheum arthritis Father    Stroke Father    Hypertension Other    Ovarian cancer Maternal Aunt    Lung cancer Maternal Aunt    Breast cancer Maternal Aunt    Bone cancer Maternal Aunt    Stroke Maternal Uncle    Other Neg Hx     Social History Social History   Tobacco Use   Smoking status: Former    Current packs/day: 0.00    Average packs/day: 1 pack/day for 20.0 years (20.0 ttl pk-yrs)    Types: Cigarettes    Start date: 10/25/1992    Quit date: 10/25/2012    Years since quitting: 11.4   Smokeless tobacco: Never   Tobacco comments:    QUIT 04/2010 AND STARTED BACK 2014 X 3 MONTHS. less than 1 ppd.  started at age 69.    Vaping Use   Vaping status: Never Used  Substance Use Topics   Alcohol use: No   Drug use: No     Allergies   Azithromycin, Bee venom, Flagyl  [metronidazole ], Penicillins, Shellfish allergy, Klonopin  [clonazepam ], Cephalexin, and Cephalosporins   Review of Systems Review of Systems Per HPI  Physical Exam Triage Vital Signs ED Triage Vitals  Encounter Vitals Group     BP 04/10/24 1128 93/62     Girls Systolic BP Percentile --      Girls Diastolic BP Percentile --      Boys Systolic BP Percentile --      Boys Diastolic BP Percentile --      Pulse  Rate 04/10/24 1123 96     Resp 04/10/24 1123 17     Temp 04/10/24 1123 97.9 F (36.6 C)     Temp Source 04/10/24 1123 Oral     SpO2 04/10/24 1123 95 %     Weight --      Height --      Head Circumference --      Peak Flow --      Pain Score 04/10/24 1120 0     Pain Loc --      Pain Education --      Exclude from Growth Chart --    No data found.  Updated Vital Signs BP 93/62 (BP Location: Right Wrist)   Pulse 96   Temp 97.9 F (36.6 C) (Oral)   Resp 17   LMP 07/18/2011   SpO2 95%   Visual Acuity Right Eye Distance:   Left Eye Distance:   Bilateral Distance:    Right Eye Near:   Left Eye Near:    Bilateral Near:     Physical Exam Vitals and nursing note reviewed.  Constitutional:      Appearance: She is not ill-appearing or toxic-appearing.  HENT:     Head: Normocephalic and atraumatic.     Right Ear: Hearing, tympanic membrane, ear canal and external ear normal.     Left Ear: Hearing, tympanic membrane, ear canal and external ear normal.     Nose: Nose normal.     Mouth/Throat:  Lips: Pink.     Mouth: Mucous membranes are moist. No injury or oral lesions.     Dentition: Normal dentition.     Tongue: No lesions.     Pharynx: Oropharynx is clear. Uvula midline. No pharyngeal swelling, oropharyngeal exudate, posterior oropharyngeal erythema, uvula swelling or postnasal drip.     Tonsils: No tonsillar exudate.   Eyes:     General: Lids are normal. Lids are everted, no foreign bodies appreciated. Vision grossly intact. Gaze aligned appropriately. No allergic shiner, visual field deficit or scleral icterus.       Right eye: No foreign body, discharge or hordeolum.        Left eye: No foreign body, discharge or hordeolum.     Extraocular Movements: Extraocular movements intact.     Conjunctiva/sclera:     Right eye: Right conjunctiva is not injected. No chemosis, exudate or hemorrhage.    Left eye: Left conjunctiva is injected. No chemosis, exudate or  hemorrhage.    Pupils: Pupils are equal, round, and reactive to light.     Left eye: No corneal abrasion or fluorescein uptake.     Visual Fields: Right eye visual fields normal and left eye visual fields normal.     Comments: Fluorescein stain performed after application of tetracaine numbing drops (2) to the left eye. Patient tolerated well, eye flushed afterwards. No corneal abrasion.  Neck:     Trachea: Trachea and phonation normal.  Pulmonary:     Effort: Pulmonary effort is normal.   Musculoskeletal:     Cervical back: Neck supple.  Lymphadenopathy:     Cervical: No cervical adenopathy.   Skin:    General: Skin is warm and dry.     Capillary Refill: Capillary refill takes less than 2 seconds.     Findings: No rash.   Neurological:     General: No focal deficit present.     Mental Status: She is alert and oriented to person, place, and time. Mental status is at baseline.     Cranial Nerves: No dysarthria or facial asymmetry.   Psychiatric:        Mood and Affect: Mood normal.        Speech: Speech normal.        Behavior: Behavior normal.        Thought Content: Thought content normal.        Judgment: Judgment normal.      UC Treatments / Results  Labs (all labs ordered are listed, but only abnormal results are displayed) Labs Reviewed - No data to display  EKG   Radiology No results found.  Procedures Procedures (including critical care time)  Medications Ordered in UC Medications - No data to display  Initial Impression / Assessment and Plan / UC Course  I have reviewed the triage vital signs and the nursing notes.  Pertinent labs & imaging results that were available during my care of the patient were reviewed by me and considered in my medical decision making (see chart for details).   1. Acute bacterial conjunctivitis of left eye, uses contact lenses Presentation consistent with acute bacterial conjunctivitis.  HEENT exam stable, low suspicion for  ocular emergency.  Moxifloxacin eye drops ordered given contact lens use.  Advised to avoid use of contact lenses until infection fully resolves. Warm compress recommended frequently.  Over the counter medications as needed for aches/pains. Hand hygiene discussed to prevent spread of infection to others.  Advised to change pillowcase after 2 to  3 days of antibiotics to avoid reinfection.   Counseled patient on potential for adverse effects with medications prescribed/recommended today, strict ER and return-to-clinic precautions discussed, patient verbalized understanding.    Final Clinical Impressions(s) / UC Diagnoses   Final diagnoses:  Acute bacterial conjunctivitis of left eye  Uses contact lenses     Discharge Instructions      You have bacterial conjunctivitis (pink eye) which is an eye infection.    - Use antibiotic eye medication sent to pharmacy as directed.  - Change your pillowcase after 2 to 3 days to avoid reinfection.  - You may take Tylenol  every 6 hours as needed for any pain you may have.  - Avoid scratching your eye.  Wash your hands frequently to avoid spread of infection to others.  Perform warm compresses to your eye before applying the eye medication.  Do not use contacts for 14 days. Instead, use your eye glasses for vision correction. Follow-up with eye doctor as needed for new or worsening symptoms.   If you develop any new or worsening symptoms or if your symptoms do not start to improve, please return here or follow-up with your primary care provider. If your symptoms are severe, please go to the emergency room.      ED Prescriptions     Medication Sig Dispense Auth. Provider   moxifloxacin (VIGAMOX) 0.5 % ophthalmic solution Place 1 drop into the left eye 3 (three) times daily for 7 days. 3 mL Starlene Eaton, FNP      PDMP not reviewed this encounter.   Starlene Eaton, [place] 04/10/24 1228

## 2024-04-10 NOTE — Discharge Instructions (Signed)
 You have bacterial conjunctivitis (pink eye) which is an eye infection.    - Use antibiotic eye medication sent to pharmacy as directed.  - Change your pillowcase after 2 to 3 days to avoid reinfection.  - You may take Tylenol  every 6 hours as needed for any pain you may have.  - Avoid scratching your eye.  Wash your hands frequently to avoid spread of infection to others.  Perform warm compresses to your eye before applying the eye medication.  Do not use contacts for 14 days. Instead, use your eye glasses for vision correction. Follow-up with eye doctor as needed for new or worsening symptoms.   If you develop any new or worsening symptoms or if your symptoms do not start to improve, please return here or follow-up with your primary care provider. If your symptoms are severe, please go to the emergency room.

## 2024-04-10 NOTE — ED Triage Notes (Signed)
 Pt c/o left eye irration since Friday. States she has eye pain and drainage

## 2024-04-19 ENCOUNTER — Telehealth: Payer: Self-pay

## 2024-04-19 ENCOUNTER — Other Ambulatory Visit (HOSPITAL_COMMUNITY): Payer: Self-pay

## 2024-04-19 NOTE — Telephone Encounter (Signed)
 Mychart message to patient to get an update how she is doing.

## 2024-04-19 NOTE — Telephone Encounter (Signed)
 Pharmacy Patient Advocate Encounter   Received notification from CoverMyMeds that prior authorization for Emgality  120MG /ML auto-injectors (migraine) is required/requested.   Insurance verification completed.   The patient is insured through [facility] .   Per test claim: Patient has not been seen since starting medication and insurance is requiring updated documentation with patient progress on medication for submission.

## 2024-05-01 ENCOUNTER — Other Ambulatory Visit: Payer: Self-pay | Admitting: Internal Medicine

## 2024-05-01 DIAGNOSIS — M5441 Lumbago with sciatica, right side: Secondary | ICD-10-CM | POA: Diagnosis not present

## 2024-05-01 DIAGNOSIS — M539 Dorsopathy, unspecified: Secondary | ICD-10-CM | POA: Diagnosis not present

## 2024-05-01 DIAGNOSIS — Z79899 Other long term (current) drug therapy: Secondary | ICD-10-CM | POA: Diagnosis not present

## 2024-05-01 DIAGNOSIS — M5442 Lumbago with sciatica, left side: Secondary | ICD-10-CM | POA: Diagnosis not present

## 2024-05-03 ENCOUNTER — Ambulatory Visit
Admission: RE | Admit: 2024-05-03 | Discharge: 2024-05-03 | Disposition: A | Discharge status: Home / Self Care | Source: Ambulatory Visit | Attending: Internal Medicine | Admitting: Internal Medicine

## 2024-05-03 DIAGNOSIS — Z1231 Encounter for screening mammogram for malignant neoplasm of breast: Secondary | ICD-10-CM

## 2024-05-04 ENCOUNTER — Other Ambulatory Visit (HOSPITAL_COMMUNITY): Payer: Self-pay

## 2024-05-04 ENCOUNTER — Telehealth: Payer: Self-pay | Admitting: Pharmacy Technician

## 2024-05-04 NOTE — Telephone Encounter (Signed)
 Pharmacy Patient Advocate Encounter   Received notification from Patient Advice Request messages that prior authorization for EMGALITY  120MG  is required/requested.   Insurance verification completed.   The patient is insured through [facility] .   Per test claim: PA required; PA submitted to above mentioned insurance via CoverMyMeds Key/confirmation #/EOC [id number] Status is pending

## 2024-05-04 NOTE — Telephone Encounter (Signed)
 PA has been submitted, and telephone encounter has been created. Please see telephone encounter dated 7.11.25.

## 2024-05-04 NOTE — Telephone Encounter (Signed)
 Pharmacy Patient Advocate Encounter  Received notification from [facility] that Prior Authorization for EMGALITY  120MG  has been APPROVED from 7.11.25 to 7.11.26. Ran test claim, Copay is $4. This test claim was processed through [facility]- copay amounts may vary at other pharmacies due to pharmacy/plan contracts, or as the patient moves through the different stages of their insurance plan.   PA #/Case ID/Reference #: [id number]

## 2024-05-09 ENCOUNTER — Other Ambulatory Visit: Payer: Self-pay | Admitting: Internal Medicine

## 2024-05-09 DIAGNOSIS — R928 Other abnormal and inconclusive findings on diagnostic imaging of breast: Secondary | ICD-10-CM

## 2024-05-11 ENCOUNTER — Ambulatory Visit

## 2024-05-11 ENCOUNTER — Telehealth: Payer: Self-pay | Admitting: Neurology

## 2024-05-11 DIAGNOSIS — G43009 Migraine without aura, not intractable, without status migrainosus: Secondary | ICD-10-CM | POA: Diagnosis not present

## 2024-05-11 MED ORDER — METOCLOPRAMIDE HCL 5 MG/ML IJ SOLN: 10.0000 mg | Freq: Once | INTRAVENOUS | Status: DC

## 2024-05-11 MED ORDER — METOCLOPRAMIDE HCL 5 MG/ML IJ SOLN
10.0000 mg | Freq: Once | INTRAMUSCULAR | Status: AC
  Administered 2024-05-11: 10 mg via INTRAMUSCULAR

## 2024-05-11 MED ORDER — KETOROLAC TROMETHAMINE 60 MG/2ML IM SOLN
60.0000 mg | Freq: Once | INTRAMUSCULAR | Status: AC
  Administered 2024-05-11: 60 mg via INTRAMUSCULAR

## 2024-05-11 MED ORDER — DIPHENHYDRAMINE HCL 50 MG/ML IJ SOLN
50.0000 mg | Freq: Once | INTRAMUSCULAR | Status: AC
  Administered 2024-05-11: 25 mg via INTRAVENOUS

## 2024-05-11 NOTE — Progress Notes (Signed)
 Administer cocktail-ketorolac , diphenhydramine , metoclopramide . Pt tolerated well.

## 2024-05-11 NOTE — Telephone Encounter (Signed)
 Pt said she has developed a migraine and its a 10/10. Wants to come in for a cocktail today

## 2024-05-11 NOTE — Telephone Encounter (Signed)
 OK if she has a driver

## 2024-05-14 ENCOUNTER — Telehealth: Payer: Self-pay | Admitting: Internal Medicine

## 2024-05-14 NOTE — Telephone Encounter (Signed)
 Copied from CRM [id number]. Topic: Clinical - Medical Advice >> May 14, 2024 12:20 PM Gennette ORN wrote: Reason for CRM: Patient is concerned about medical records they was suppose to be sent over to us . She is also concerned about her breast as well. She wants to know what needs to be done.

## 2024-05-15 ENCOUNTER — Other Ambulatory Visit: Payer: Self-pay | Admitting: Internal Medicine

## 2024-05-21 NOTE — Telephone Encounter (Signed)
 Called and spoke with patient. Informed her of recommendation from Dr.John. She noted she would be following back up with [facility] as well as her OBGYN

## 2024-05-21 NOTE — Telephone Encounter (Signed)
 Please let pt know - the imaging place normally gives any orders that are needed for this, so she should get back to them, as they need further imaging.   thanks

## 2024-05-24 ENCOUNTER — Other Ambulatory Visit (HOSPITAL_COMMUNITY): Payer: Self-pay

## 2024-05-24 ENCOUNTER — Encounter: Payer: Self-pay | Admitting: Internal Medicine

## 2024-05-24 ENCOUNTER — Ambulatory Visit (INDEPENDENT_AMBULATORY_CARE_PROVIDER_SITE_OTHER): Admitting: Internal Medicine

## 2024-05-24 VITALS — BP 102/70 | HR 62 | Temp 97.9°F | Ht 66.0 in | Wt 262.6 lb

## 2024-05-24 DIAGNOSIS — I10 Essential (primary) hypertension: Secondary | ICD-10-CM | POA: Diagnosis not present

## 2024-05-24 DIAGNOSIS — E1140 Type 2 diabetes mellitus with diabetic neuropathy, unspecified: Secondary | ICD-10-CM | POA: Diagnosis not present

## 2024-05-24 DIAGNOSIS — E559 Vitamin D deficiency, unspecified: Secondary | ICD-10-CM | POA: Diagnosis not present

## 2024-05-24 DIAGNOSIS — R928 Other abnormal and inconclusive findings on diagnostic imaging of breast: Secondary | ICD-10-CM | POA: Diagnosis not present

## 2024-05-24 DIAGNOSIS — F411 Generalized anxiety disorder: Secondary | ICD-10-CM

## 2024-05-24 DIAGNOSIS — Z794 Long term (current) use of insulin: Secondary | ICD-10-CM

## 2024-05-24 MED ORDER — GABAPENTIN 300 MG PO CAPS
900.0000 mg | ORAL_CAPSULE | Freq: Three times a day (TID) | ORAL | 1 refills | Status: DC
  Filled 2024-05-24: qty 270, 30d supply, fill #0
  Filled 2024-08-04 – 2024-10-01 (×2): qty 270, 30d supply, fill #1

## 2024-05-24 NOTE — Progress Notes (Signed)
 Patient ID: Nicole Melendez, female   DOB: 1976-08-31, 48 y.o.   MRN: [id number]        Chief Complaint: follow up abnormal mammogram, neuropathy, low vit d,  htn       HPI:  Nicole Melendez is a 48 y.o. female here very concerned because she has not been contacted regarding her recent finding of abnormal mammogram and need for further diagnostic mammogram.  Denies breast pain or mass. Pt order for such as placed by me July 18 but she was concerned about details of where and when    Pt denies chest pain, increased sob or doe, wheezing, orthopnea, PND, increased LE swelling, palpitations, dizziness or syncope.  Pt denies polydipsia, polyuria, or new focal neuro s/s.   Needs gabapentin  refill.        Wt Readings from Last 3 Encounters:  05/24/24 262 lb 9.6 oz (119.1 kg)  03/26/24 247 lb (112 kg)  03/13/24 261 lb (118.4 kg)   BP Readings from Last 3 Encounters:  05/24/24 102/70  04/10/24 93/62  03/26/24 102/71         Past Medical History:  Diagnosis Date   Allergic rhinitis, cause unspecified    Anemia    Anxiety    Arthritis    COPD (chronic obstructive pulmonary disease) (HCC)    Depression    Diabetes mellitus type II    steroid related, patient states im no longer diabetic   Essential hypertension 08/08/2015   GERD (gastroesophageal reflux disease)    History of blood transfusion    Hyperlipidemia    Menorrhagia    Migraine    Morbid obesity (HCC)    Otitis media 06/28/2015   Peptic ulcer disease    Pneumonia    Positive ANA (antinuclear antibody) 02/14/2012   Sarcoid    including hand per rheumatology-Dr. Mai   Sarcoidosis of lung (HCC)    Shortness of breath    on exertion   Varicose veins with pain    Past Surgical History:  Procedure Laterality Date   ABDOMINAL HYSTERECTOMY     COLONOSCOPY WITH PROPOFOL  N/A 10/19/2016   Procedure: COLONOSCOPY WITH PROPOFOL ;  Surgeon: Gustav LULLA Mcgee, MD;  Location: [facility];  Service: Endoscopy;  Laterality: N/A;    ESOPHAGOGASTRODUODENOSCOPY (EGD) WITH PROPOFOL  N/A 10/19/2016   Procedure: ESOPHAGOGASTRODUODENOSCOPY (EGD) WITH PROPOFOL ;  Surgeon: Gustav LULLA Mcgee, MD;  Location: [facility];  Service: Endoscopy;  Laterality: N/A;   HERNIA MESH REMOVAL  02/2013   HERNIA REPAIR     KNEE ARTHROSCOPY Left 01/15/2022   Procedure: ARTHROSCOPY KNEE;  Surgeon: Kay Kemps, MD;  Location: [facility];  Service: Orthopedics;  Laterality: Left;   uterine ablation  03/2010   WISDOM TOOTH EXTRACTION      reports that she quit smoking about 11 years ago. Her smoking use included cigarettes. She started smoking about 31 years ago. She has a 20 pack-year smoking history. She has never used smokeless tobacco. She reports that she does not drink alcohol and does not use drugs. family history includes Allergies in her mother; Bone cancer in her maternal aunt; Breast cancer in her maternal aunt; Diabetes in her father and mother; Heart attack in her mother; Heart disease in her father; Hypertension in an other family member; Lung cancer in her maternal aunt; Ovarian cancer in her maternal aunt; Rheum arthritis in her father; Stroke in her father and maternal uncle. Allergies  Allergen Reactions   Azithromycin Hives    (z pak) hives  Bee Venom Anaphylaxis   Flagyl  [Metronidazole ] Hives and Shortness Of Breath   Penicillins Shortness Of Breath and Swelling    Has patient had a PCN reaction causing immediate rash, facial/tongue/throat swelling, SOB or lightheadedness with hypotension: Yes Has patient had a PCN reaction causing severe rash involving mucus membranes or skin necrosis: No Has patient had a PCN reaction that required hospitalization No Has patient had a PCN reaction occurring within the last 10 years: No If all of the above answers are NO, then may proceed with Cephalosporin use.  REACTION: swelling and difficulty breathing   Shellfish Allergy Anaphylaxis   Klonopin  [Clonazepam ] Other (See Comments)    Memory  difficulty   Cephalexin Hives and Swelling   Cephalosporins Hives   Current Outpatient Medications on File Prior to Visit  Medication Sig Dispense Refill   acetaminophen  (TYLENOL ) 650 MG CR tablet Take 650-1,300 mg by mouth every 8 (eight) hours as needed for pain.     ALPRAZolam  (XANAX ) 0.5 MG tablet TAKE 1 TABLET(0.5 MG) BY MOUTH TWICE DAILY AS NEEDED FOR ANXIETY 60 tablet 1   azelastine  (ASTELIN ) 0.1 % nasal spray Place 2 sprays into both nostrils 2 (two) times daily. Use in each nostril as directed 30 mL 12   baclofen  (LIORESAL ) 10 MG tablet TAKE 1 TABLET(10 MG) BY MOUTH THREE TIMES DAILY 60 tablet 1   BD PEN NEEDLE NANO 2ND GEN 32G X 4 MM MISC USE AS DIRECTED THREE TIMES DAILY 300 each 3   Blood Glucose Monitoring Suppl (ONETOUCH VERIO) w/Device KIT Use as directed daily E11.9 1 kit 0   diphenoxylate -atropine  (LOMOTIL ) 2.5-0.025 MG tablet Take 1 tablet by mouth 4 (four) times daily as needed for diarrhea or loose stools. 30 tablet 0   escitalopram  (LEXAPRO ) 10 MG tablet Take 1 tablet (10 mg total) by mouth daily. 90 tablet 3   fluticasone  (FLONASE ) 50 MCG/ACT nasal spray Place 2 sprays into both nostrils daily. 16 g 6   Galcanezumab -gnlm (EMGALITY ) 120 MG/ML SOAJ Inject 120 mg into the skin every 28 (twenty-eight) days. 1.12 mL 5   glucose blood (ONETOUCH VERIO) test strip USE AS DIRECTED THREE TIMES DAILY E11.9 300 strip 11   insulin  aspart (NOVOLOG  FLEXPEN) 100 UNIT/ML FlexPen ADMINISTER 12 UNITS UNDER THE SKIN FOUR TIMES DAILY 15 mL 2   mometasone -formoterol  (DULERA ) 200-5 MCG/ACT AERO Inhale 2 puffs into the lungs in the morning and at bedtime. 8.8 g 5   naloxone  (NARCAN ) nasal spray 4 mg/0.1 mL SMARTSIG:1 Spray(s) Both Nares PRN     nystatin  (MYCOSTATIN ) 100000 UNIT/ML suspension Take by mouth.     ondansetron  (ZOFRAN -ODT) 4 MG disintegrating tablet Take 1 tablet (4 mg total) by mouth every 8 (eight) hours as needed. 20 tablet 0   oxyCODONE -acetaminophen  (PERCOCET) 10-325 MG tablet  Take 1 tablet by mouth every 6 (six) hours as needed for pain. Per pain management 120 tablet 0   pantoprazole  (PROTONIX ) 40 MG tablet Take 1 tablet (40 mg total) by mouth 2 (two) times daily before a meal. 60 tablet 5   potassium chloride  SA (KLOR-CON  M) 20 MEQ tablet      Rimegepant Sulfate  (NURTEC) 75 MG TBDP 1 tab by mouth once daily as needed 16 tablet 11   rizatriptan  (MAXALT -MLT) 10 MG disintegrating tablet Take 1 tablet earliest onset of migraine.  May repeat in 2 hours if needed.  Maximum 2 tablets in 24 hours 9 tablet 11   rosuvastatin  (CRESTOR ) 40 MG tablet TAKE 1 TABLET BY MOUTH DAILY  90 tablet 3   Semaglutide , 1 MG/DOSE, 4 MG/3ML SOPN Inject 1 mg as directed once a week. 3 mL 11   sucralfate  (CARAFATE ) 1 g tablet TAKE 1 TABLET(1 GRAM) BY MOUTH FOUR TIMES DAILY AND AT BEDTIME WITH MEALS. 120 tablet 3   telmisartan -hydrochlorothiazide  (MICARDIS  HCT) 40-12.5 MG tablet TAKE 1 TABLET BY MOUTH DAILY 90 tablet 3   triamcinolone  (NASACORT ) 55 MCG/ACT AERO nasal inhaler Place 2 sprays into the nose daily. 1 each 12   TURMERIC PO Take 1 tablet by mouth daily.     vitamin B-12 (CYANOCOBALAMIN ) 1000 MCG tablet Take 1,000 mcg by mouth daily.     Vitamin D , Ergocalciferol , (DRISDOL ) 1.25 MG (50000 UNIT) CAPS capsule Take by mouth.     XOPENEX  HFA 45 MCG/ACT inhaler INHALE 1 TO 2 PUFFS INTO THE LUNGS EVERY 8 HOURS AS NEEDED FOR WHEEZING OR SHORTNESS OF BREATH, USE 2 PUFFS 3 TIMES DAILY THEN BACK TO HOME REGIMEN 15 g 3   zonisamide  (ZONEGRAN ) 100 MG capsule Take 2 capsules (200 mg total) by mouth daily. 60 capsule 5   ondansetron  (ZOFRAN -ODT) 4 MG disintegrating tablet Take 1 tablet (4 mg total) by mouth every 8 (eight) hours as needed. (Patient not taking: Reported on 05/24/2024) 20 tablet 0   No current facility-administered medications on file prior to visit.        ROS:  All others reviewed and negative.  Objective        PE:  BP 102/70   Pulse 62   Temp 97.9 F (36.6 C)   Ht 5' 6 (1.676  m)   Wt 262 lb 9.6 oz (119.1 kg)   LMP 07/18/2011   SpO2 98%   BMI 42.38 kg/m                 Constitutional: Pt appears in NAD               HENT: Head: NCAT.                Right Ear: External ear normal.                 Left Ear: External ear normal.                Eyes: . Pupils are equal, round, and reactive to light. Conjunctivae and EOM are normal               Nose: without d/c or deformity               Neck: Neck supple. Gross normal ROM               Cardiovascular: Normal rate and regular rhythm.                 Pulmonary/Chest: Effort normal and breath sounds without rales or wheezing.                Abd:  Soft, NT, ND, + BS, no organomegaly               Neurological: Pt is alert. At baseline orientation, motor grossly intact               Skin: Skin is warm. No rashes, no other new lesions, LE edema - none               Psychiatric: Pt behavior is normal without agitation   Micro: none  Cardiac tracings I have personally interpreted today:  none  Pertinent Radiological findings (summarize): none   Lab Results  Component Value Date   WBC 6.3 03/13/2024   HGB 13.1 03/13/2024   HCT 39.5 03/13/2024   PLT 284.0 03/13/2024   GLUCOSE 84 03/13/2024   CHOL 152 03/13/2024   TRIG 72.0 03/13/2024   HDL 52.10 03/13/2024   LDLDIRECT 135.0 07/05/2011   LDLCALC 86 03/13/2024   ALT 11 03/13/2024   AST 13 03/13/2024   NA 142 03/13/2024   K 4.4 03/13/2024   CL 106 03/13/2024   CREATININE 0.90 03/13/2024   BUN 15 03/13/2024   CO2 28 03/13/2024   TSH 1.82 03/13/2024   INR 1.3 11/17/2012   HGBA1C 5.6 03/13/2024   MICROALBUR <0.7 03/13/2024   Assessment/Plan:  Nicole Melendez is a 48 y.o. Black or African American [2] female with  has a past medical history of Allergic rhinitis, cause unspecified, Anemia, Anxiety, Arthritis, COPD (chronic obstructive pulmonary disease) (HCC), Depression, Diabetes mellitus type II, Essential hypertension (08/08/2015), GERD  (gastroesophageal reflux disease), History of blood transfusion, Hyperlipidemia, Menorrhagia, Migraine, Morbid obesity (HCC), Otitis media (06/28/2015), Peptic ulcer disease, Pneumonia, Positive ANA (antinuclear antibody) (02/14/2012), Sarcoid, Sarcoidosis of lung (HCC), Shortness of breath, and Varicose veins with pain.  Vitamin D  deficiency Last vitamin D  Lab Results  Component Value Date   VD25OH 16.60 (L) 03/13/2024   Low, to start oral replacement   Essential hypertension BP Readings from Last 3 Encounters:  05/24/24 102/70  04/10/24 93/62  03/26/24 102/71   Stable, pt to continue medical treatment micardis  hct 40 12.5 qd   Abnormal mammogram At this time I was able to d/w pt the referral to [place] [facility], so she is relieved and plans to call there very soon for date and time unless she is contacted first  Anxiety state With some situational worsening today, but reassured, and o/w does not need further change in tx  Diabetes mellitus with neuropathy [facility]) Lab Results  Component Value Date   HGBA1C 5.6 03/13/2024   Stable, pt to continue current medical with gabapentin  asd  Followup: Return if symptoms worsen or fail to improve.  Lynwood Rush, MD 05/26/2024 3:41 PM [facility]

## 2024-05-24 NOTE — Patient Instructions (Addendum)
 Please be sure to call to schedule the Diagnostic Mammogram at the Breast Center with [facility]  Please continue all other medications as before, and refills have been done if requested - gabapentin   Please have the pharmacy call with any other refills you may need.  Please keep your appointments with your specialists as you may have planned

## 2024-05-25 ENCOUNTER — Ambulatory Visit
Admission: RE | Admit: 2024-05-25 | Discharge: 2024-05-25 | Disposition: A | Discharge status: Home / Self Care | Source: Ambulatory Visit | Attending: Internal Medicine | Admitting: Internal Medicine

## 2024-05-25 DIAGNOSIS — R928 Other abnormal and inconclusive findings on diagnostic imaging of breast: Secondary | ICD-10-CM

## 2024-05-26 ENCOUNTER — Other Ambulatory Visit (HOSPITAL_COMMUNITY): Payer: Self-pay

## 2024-05-26 ENCOUNTER — Encounter: Payer: Self-pay | Admitting: Internal Medicine

## 2024-05-26 DIAGNOSIS — R928 Other abnormal and inconclusive findings on diagnostic imaging of breast: Secondary | ICD-10-CM | POA: Insufficient documentation

## 2024-05-26 MED ORDER — GABAPENTIN 300 MG PO CAPS
900.0000 mg | ORAL_CAPSULE | Freq: Three times a day (TID) | ORAL | 1 refills | Status: DC
  Filled 2024-05-26 – 2024-11-09 (×4): qty 270, 30d supply, fill #0

## 2024-05-26 MED ORDER — SEMAGLUTIDE (1 MG/DOSE) 4 MG/3ML ~~LOC~~ SOPN
1.0000 mg | PEN_INJECTOR | SUBCUTANEOUS | 12 refills | Status: DC
  Filled 2024-05-26: qty 3, 28d supply, fill #0

## 2024-05-26 MED ORDER — SUCRALFATE 1 G PO TABS
1.0000 g | ORAL_TABLET | ORAL | 3 refills | Status: DC
  Filled 2024-05-26: qty 120, 24d supply, fill #0

## 2024-05-26 MED FILL — Sucralfate Tab 1 GM: ORAL | 24 days supply | Qty: 120 | Fill #0 | Status: AC

## 2024-05-26 NOTE — Assessment & Plan Note (Signed)
 At this time I was able to d/w pt the referral to [place] [facility], so she is relieved and plans to call there very soon for date and time unless she is contacted first

## 2024-05-26 NOTE — Assessment & Plan Note (Signed)
 With some situational worsening today, but reassured, and o/w does not need further change in tx

## 2024-05-26 NOTE — Assessment & Plan Note (Signed)
 BP Readings from Last 3 Encounters:  05/24/24 102/70  04/10/24 93/62  03/26/24 102/71   Stable, pt to continue medical treatment micardis  hct 40 12.5 qd

## 2024-05-26 NOTE — Assessment & Plan Note (Signed)
 Lab Results  Component Value Date   HGBA1C 5.6 03/13/2024   Stable, pt to continue current medical with gabapentin  asd

## 2024-05-26 NOTE — Assessment & Plan Note (Signed)
 Last vitamin D  Lab Results  Component Value Date   VD25OH 16.60 (L) 03/13/2024   Low, to start oral replacement

## 2024-05-28 ENCOUNTER — Other Ambulatory Visit (HOSPITAL_COMMUNITY): Payer: Self-pay

## 2024-05-28 ENCOUNTER — Telehealth (HOSPITAL_COMMUNITY): Payer: Self-pay

## 2024-05-28 ENCOUNTER — Encounter (HOSPITAL_COMMUNITY): Payer: Self-pay

## 2024-05-28 ENCOUNTER — Telehealth (HOSPITAL_COMMUNITY): Payer: Self-pay | Admitting: Pharmacy Technician

## 2024-05-28 NOTE — Telephone Encounter (Signed)
 Pharmacy Patient Advocate Encounter   Received notification from Pt Calls Messages that prior authorization for OZEMPIC  1 MG/DOSE (4 MG/3 ML) is required/requested.   Insurance verification completed.   The patient is insured through [employer] .   Per test claim: PA required; PA submitted to above mentioned insurance via Prompt PA Key/confirmation #/EOC [id number] Status is pending

## 2024-05-28 NOTE — Telephone Encounter (Signed)
 PA request has been Received. New Encounter has been or will be created for follow up. For additional info see Pharmacy Prior Auth telephone encounter from 05/28/24.

## 2024-05-29 ENCOUNTER — Other Ambulatory Visit (HOSPITAL_COMMUNITY): Payer: Self-pay

## 2024-05-29 ENCOUNTER — Encounter: Payer: Self-pay | Admitting: Internal Medicine

## 2024-05-29 MED ORDER — OXYCODONE-ACETAMINOPHEN 10-325 MG PO TABS
1.0000 | ORAL_TABLET | Freq: Every day | ORAL | 0 refills | Status: DC
Start: 2024-05-29 — End: 2024-07-02
  Filled 2024-05-29: qty 150, 30d supply, fill #0

## 2024-05-29 NOTE — Telephone Encounter (Signed)
Being addressed in separate MyChart message.

## 2024-05-30 ENCOUNTER — Other Ambulatory Visit: Payer: Self-pay

## 2024-05-30 ENCOUNTER — Other Ambulatory Visit (HOSPITAL_BASED_OUTPATIENT_CLINIC_OR_DEPARTMENT_OTHER): Payer: Self-pay

## 2024-05-30 ENCOUNTER — Other Ambulatory Visit (HOSPITAL_COMMUNITY): Payer: Self-pay

## 2024-05-30 MED ORDER — FLUTICASONE PROPIONATE 50 MCG/ACT NA SUSP
2.0000 | Freq: Every day | NASAL | 6 refills | Status: AC
  Filled 2024-05-30 – 2024-06-09 (×2): qty 16, 30d supply, fill #0

## 2024-05-30 MED ORDER — GABAPENTIN 300 MG PO CAPS
900.0000 mg | ORAL_CAPSULE | Freq: Three times a day (TID) | ORAL | 1 refills | Status: AC
  Filled 2024-05-30 – 2024-08-30 (×2): qty 270, 30d supply, fill #0
  Filled 2024-09-26: qty 270, 30d supply, fill #1

## 2024-05-30 MED ORDER — LEVALBUTEROL TARTRATE 45 MCG/ACT IN AERO
1.0000 | INHALATION_SPRAY | Freq: Three times a day (TID) | RESPIRATORY_TRACT | 2 refills | Status: DC | PRN
  Filled 2024-05-30 – 2024-08-30 (×3): qty 15, 30d supply, fill #0
  Filled 2024-09-17 – 2024-09-22 (×2): qty 15, 30d supply, fill #1

## 2024-05-30 MED ORDER — ZONISAMIDE 100 MG PO CAPS
200.0000 mg | ORAL_CAPSULE | Freq: Every day | ORAL | 5 refills | Status: DC
  Filled 2024-05-30: qty 60, 30d supply, fill #0

## 2024-05-30 MED ORDER — PANTOPRAZOLE SODIUM 40 MG PO TBEC
40.0000 mg | DELAYED_RELEASE_TABLET | Freq: Two times a day (BID) | ORAL | 5 refills | Status: AC
  Filled 2024-05-30: qty 60, 30d supply, fill #0

## 2024-05-30 MED ORDER — ZONISAMIDE 100 MG PO CAPS
200.0000 mg | ORAL_CAPSULE | Freq: Every day | ORAL | 3 refills | Status: DC
  Filled 2024-05-30: qty 60, 30d supply, fill #0

## 2024-05-30 MED ORDER — EMGALITY 120 MG/ML ~~LOC~~ SOAJ
120.0000 mg | SUBCUTANEOUS | 5 refills | Status: AC
  Filled 2024-05-30 – 2024-06-04 (×2): qty 1, 28d supply, fill #0
  Filled 2024-06-28: qty 1, 28d supply, fill #1
  Filled 2024-08-04: qty 1, 30d supply, fill #2
  Filled 2024-08-30 – 2024-09-03 (×2): qty 1, 28d supply, fill #2
  Filled 2024-09-26: qty 1, 28d supply, fill #3
  Filled 2024-10-28 – 2024-11-09 (×2): qty 1, 28d supply, fill #4

## 2024-05-30 MED ORDER — TELMISARTAN-HCTZ 40-12.5 MG PO TABS
1.0000 | ORAL_TABLET | Freq: Every day | ORAL | 3 refills | Status: DC
  Filled 2024-05-30 – 2024-06-04 (×2): qty 90, 90d supply, fill #0
  Filled 2024-08-30: qty 60, 60d supply, fill #1

## 2024-05-30 MED ORDER — AZELASTINE HCL 0.1 % NA SOLN
2.0000 | Freq: Two times a day (BID) | NASAL | 12 refills | Status: AC
Start: 2024-03-26 — End: ?
  Filled 2024-05-30: qty 30, 50d supply, fill #0
  Filled 2024-06-09: qty 30, 30d supply, fill #0
  Filled 2024-06-28: qty 30, 34d supply, fill #0

## 2024-05-30 MED ORDER — ROSUVASTATIN CALCIUM 40 MG PO TABS
40.0000 mg | ORAL_TABLET | Freq: Every day | ORAL | 3 refills | Status: DC
  Filled 2024-05-30: qty 90, 90d supply, fill #0
  Filled 2024-06-28: qty 90, 90d supply, fill #1

## 2024-05-30 MED ORDER — NURTEC 75 MG PO TBDP
1.0000 | ORAL_TABLET | Freq: Every day | ORAL | 11 refills | Status: DC | PRN
  Filled 2024-05-30: qty 16, 16d supply, fill #0
  Filled 2024-06-28: qty 16, 30d supply, fill #0
  Filled 2024-08-04: qty 16, 30d supply, fill #1

## 2024-05-30 MED ORDER — OZEMPIC (1 MG/DOSE) 4 MG/3ML ~~LOC~~ SOPN
1.0000 mg | PEN_INJECTOR | SUBCUTANEOUS | 11 refills | Status: DC
  Filled 2024-05-30 – 2024-08-04 (×5): qty 3, 28d supply, fill #0
  Filled 2024-09-30 – 2024-11-09 (×3): qty 3, 28d supply, fill #1

## 2024-05-30 MED ORDER — ESCITALOPRAM OXALATE 10 MG PO TABS
10.0000 mg | ORAL_TABLET | Freq: Every day | ORAL | 3 refills | Status: AC
  Filled 2024-05-30 – 2024-06-04 (×2): qty 90, 90d supply, fill #0
  Filled 2024-08-04 – 2024-08-23 (×2): qty 90, 90d supply, fill #1
  Filled 2024-10-07 – 2024-11-27 (×3): qty 90, 90d supply, fill #2

## 2024-05-30 MED ORDER — OZEMPIC (0.25 OR 0.5 MG/DOSE) 2 MG/3ML ~~LOC~~ SOPN
0.5000 mg | PEN_INJECTOR | SUBCUTANEOUS | 11 refills | Status: DC
  Filled 2024-05-30 – 2024-06-09 (×2): qty 3, 28d supply, fill #0

## 2024-05-30 NOTE — Telephone Encounter (Signed)
 Pharmacy Patient Advocate Encounter  Received notification from [employer] that Prior Authorization for OZEMPIC  1 MG/DOSE (4 MG/3 ML)  has been APPROVED from - to -. Ran test claim, Copay is $4. This test claim was processed through [facility]- copay amounts may vary at other pharmacies due to pharmacy/plan contracts, or as the patient moves through the different stages of their insurance plan.   PA #/Case ID/Reference #: [id number]

## 2024-06-01 ENCOUNTER — Other Ambulatory Visit

## 2024-06-01 ENCOUNTER — Encounter

## 2024-06-01 DIAGNOSIS — M5442 Lumbago with sciatica, left side: Secondary | ICD-10-CM | POA: Diagnosis not present

## 2024-06-01 DIAGNOSIS — M5441 Lumbago with sciatica, right side: Secondary | ICD-10-CM | POA: Diagnosis not present

## 2024-06-04 ENCOUNTER — Other Ambulatory Visit: Payer: Self-pay

## 2024-06-04 ENCOUNTER — Other Ambulatory Visit (HOSPITAL_COMMUNITY): Payer: Self-pay

## 2024-06-05 ENCOUNTER — Other Ambulatory Visit (HOSPITAL_COMMUNITY): Payer: Self-pay

## 2024-06-06 ENCOUNTER — Ambulatory Visit
Admission: RE | Admit: 2024-06-06 | Discharge: 2024-06-06 | Disposition: A | Discharge status: Home / Self Care | Attending: Physician Assistant | Admitting: Physician Assistant

## 2024-06-06 ENCOUNTER — Other Ambulatory Visit: Payer: Self-pay

## 2024-06-06 ENCOUNTER — Other Ambulatory Visit (HOSPITAL_COMMUNITY): Payer: Self-pay

## 2024-06-06 VITALS — BP 92/60 | HR 80 | Temp 97.9°F | Resp 19 | Ht 66.0 in | Wt 255.0 lb

## 2024-06-06 DIAGNOSIS — R21 Rash and other nonspecific skin eruption: Secondary | ICD-10-CM | POA: Diagnosis not present

## 2024-06-06 DIAGNOSIS — B86 Scabies: Secondary | ICD-10-CM

## 2024-06-06 MED ORDER — PERMETHRIN 5 % EX CREA
TOPICAL_CREAM | CUTANEOUS | 0 refills | Status: DC
  Filled 2024-06-06: qty 60, 7d supply, fill #0

## 2024-06-06 NOTE — ED Triage Notes (Addendum)
 Pt presents with complaints of generalized itching x 2 weeks. Pt states there is red blotches and bruising throughout the body. Currently denies pain. OTC Benadryl , calamine lotion, and lice treatment used at home with no improvement. Looked in bed and did not see any bed bugs. Severe itching on right upper arm where tattoo is located. Pt states she stopped taking her Lexapro  + Emgality  for a period of time. Has now resumed both medications. Unsure if the sudden stop in medications have contributed to her symptoms.

## 2024-06-06 NOTE — Discharge Instructions (Addendum)
 You were seen today for concerns of a rash that is very itchy.  At this time I believe you have something called scabies. To help with the itching I recommend taking a second-generation antihistamine such as Claritin , or Zyrtec  per your preference. To help treat the scabies I have sent you in a prescription for a medication called permethrin  cream.  Please apply this from the neck  to soles of feet  at bedtime and leave it on overnight. Please make an attempt to get the cream under your fingernails and toenails as well.   You then wash it off in the morning.  Usually a single treatment is sufficient to treat scabies but if needed you can reapply it in one to two weeks for a second treatment.  Any clothing or linens that you have worn in the past week should be washed and dried on the hottest setting that they can tolerate or isolated for a week and a tightly sealed bag. If you feel like your symptoms are not improving or seem to be worsening you can always return here or follow-up with your PCP for further evaluation and ongoing management.

## 2024-06-06 NOTE — ED Provider Notes (Signed)
 [facility]    CSN: [id number] Arrival date & time: 06/06/24  0858      History   Chief Complaint Chief Complaint  Patient presents with   Pruritis    HPI Nicole Melendez is a 48 y.o. female.   HPI  Pt presents today with concerns for rash and itching She states this is the second week of symptoms and it has not improved with home measures Rash is located along the right arm and right leg. She states it feels like something is crawling under her skin She thinks it gets worse at night with regards to itching   She has tried treatment for scabies with OTC Lice treatment  She has shaved all her body hair and treated her scalp with Lice treatment  She reports the rash on her arm is concentrated over a tattoo that is about 2 decades old   She denies recent changes to soaps, lotions, detergents, foods She states she did recently restart her migraine medication and Lexapro  so is concerned this may be withdrawal. She was out of her Emgality  for June and July and Lexapro  for July       Past Medical History:  Diagnosis Date   Allergic rhinitis, cause unspecified    Anemia    Anxiety    Arthritis    COPD (chronic obstructive pulmonary disease) (HCC)    Depression    Diabetes mellitus type II    steroid related, patient states im no longer diabetic   Essential hypertension 08/08/2015   GERD (gastroesophageal reflux disease)    History of blood transfusion    Hyperlipidemia    Menorrhagia    Migraine    Morbid obesity (HCC)    Otitis media 06/28/2015   Peptic ulcer disease    Pneumonia    Positive ANA (antinuclear antibody) 02/14/2012   Sarcoid    including hand per rheumatology-Dr. Mai   Sarcoidosis of lung (HCC)    Shortness of breath    on exertion   Varicose veins with pain     Patient Active Problem List   Diagnosis Date Noted   Abnormal mammogram 05/26/2024   AGE (acute gastroenteritis) 12/10/2023   Blurred vision, bilateral 09/17/2023    Dyspepsia 08/17/2023   Acute recurrent pancreatitis 08/01/2023   Left ear pain 07/23/2023   OSA (obstructive sleep apnea) 06/17/2023   Constipation 03/23/2023   Nail disorder 12/25/2022   Cellulitis of great toe 12/22/2022   Acute upper respiratory infection 09/06/2022   Vitamin D  deficiency 10/21/2020   Right low back pain 10/15/2020   Recurrent falls while walking 10/15/2020   COVID-19 virus infection 09/28/2020   Exposure to COVID-19 virus 07/10/2020   Viral illness 07/10/2020   Sinusitis 05/23/2020   Allergic conjunctivitis 05/23/2020   Yeast vaginitis 05/23/2020   Polyarthralgia 02/20/2020   Tibialis posterior tendon tear, nontraumatic, right 02/08/2020   Neck pain 12/14/2019   COPD (chronic obstructive pulmonary disease) (HCC) 11/07/2019   Suspected COVID-19 virus infection 08/02/2019   Hematochezia 04/05/2019   Snoring 11/17/2018   Eye symptom 08/03/2018   Right lumbar radiculopathy 05/08/2018   Chronic pain 10/11/2017   Sarcoidosis of lung (HCC)    Acute on chronic diastolic CHF (congestive heart failure) (HCC) 09/22/2017   Rheumatoid arthritis (HCC) 09/18/2017   Chronic diastolic CHF (congestive heart failure) (HCC) 09/18/2017   Pancreatitis 06/26/2017   Right ankle sprain 06/26/2017   Possible exposure to STD 06/15/2017   Diarrhea 04/10/2017   Bilateral hand swelling 04/07/2017  Abdominal pain 04/07/2017   Abnormality of gait 01/26/2017   Avulsion fracture of right ankle 12/01/2016   Ulcer of esophagus with bleeding    Gastritis and gastroduodenitis    Blepharitis of right upper eyelid 10/09/2016   Peroneal tendinitis of lower leg, right 10/06/2016   Right ankle pain 09/16/2016   Dizziness 03/11/2016   Mixed headache 03/09/2016   Chronic venous insufficiency 02/24/2016   Gastroenteritis 12/30/2015   Headache 12/30/2015   Epistaxis 12/09/2015   Post concussive syndrome 12/03/2015   Cervical muscle strain 12/03/2015   Right-sided face pain 11/26/2015    Multiple contusions 11/26/2015   Neck pain, bilateral posterior 11/26/2015   Low back pain 11/26/2015   Spinal stenosis of lumbar region 10/28/2015   Chronic sciatica of left side 10/28/2015   DOE (dyspnea on exertion) 09/02/2015   Essential hypertension 08/08/2015   Diaphoresis 08/07/2015   Cough 07/02/2015   Vaginitis and vulvovaginitis 06/29/2015   Otitis media 06/28/2015   Thrush 06/24/2015   Nausea & vomiting 06/10/2015   Wheezing 11/14/2014   Angioedema 03/19/2014   Vaginitis 11/19/2012   Smoker 11/19/2012   Positive ANA (antinuclear antibody) 02/14/2012   Polyarthritis 02/11/2012   Migraine 04/05/2011   Encounter for well adult exam with abnormal findings 02/21/2011   Allergic rhinitis 02/17/2011   URTICARIA 09/29/2010   Depression 09/14/2010   Diabetes mellitus with neuropathy (HCC) 07/31/2010   PERIPHERAL EDEMA 07/31/2010   INSOMNIA-SLEEP DISORDER-UNSPEC 06/19/2010   MENORRHAGIA 05/15/2010   Hyperlipidemia 12/17/2009   Iron deficiency anemia 12/17/2009   Anxiety state 12/17/2009   Morbid obesity (HCC) 07/02/2007   Gastroesophageal reflux disease 07/02/2007   PEPTIC ULCER DISEASE 07/02/2007    Past Surgical History:  Procedure Laterality Date   ABDOMINAL HYSTERECTOMY     COLONOSCOPY WITH PROPOFOL  N/A 10/19/2016   Procedure: COLONOSCOPY WITH PROPOFOL ;  Surgeon: Gustav LULLA Mcgee, MD;  Location: [facility];  Service: Endoscopy;  Laterality: N/A;   ESOPHAGOGASTRODUODENOSCOPY (EGD) WITH PROPOFOL  N/A 10/19/2016   Procedure: ESOPHAGOGASTRODUODENOSCOPY (EGD) WITH PROPOFOL ;  Surgeon: Gustav LULLA Mcgee, MD;  Location: [facility];  Service: Endoscopy;  Laterality: N/A;   HERNIA MESH REMOVAL  02/2013   HERNIA REPAIR     KNEE ARTHROSCOPY Left 01/15/2022   Procedure: ARTHROSCOPY KNEE;  Surgeon: Kay Kemps, MD;  Location: [facility];  Service: Orthopedics;  Laterality: Left;   uterine ablation  03/2010   WISDOM TOOTH EXTRACTION      OB History   No obstetric history  on file.      Home Medications    Prior to Admission medications   Medication Sig Start Date End Date Taking? Authorizing Provider  permethrin  (ELIMITE ) 5 % cream Apply to affected area once.  Leave on for 8-14 hours as directed by physician.  Wash off with soapy water. 06/06/24  Yes Angla Delahunt E, PA-C  acetaminophen  (TYLENOL ) 650 MG CR tablet Take 650-1,300 mg by mouth every 8 (eight) hours as needed for pain.    [provider]  ALPRAZolam  (XANAX ) 0.5 MG tablet TAKE 1 TABLET(0.5 MG) BY MOUTH TWICE DAILY AS NEEDED FOR ANXIETY 05/02/24   Norleen Lynwood ORN, MD  azelastine  (ASTELIN ) 0.1 % nasal spray Place 2 sprays into both nostrils 2 (two) times daily. Use in each nostril as directed 03/26/24   Tobie Eldora NOVAK, MD  azelastine  (ASTELIN ) 0.1 % nasal spray Place 2 sprays into both nostrils 2 (two) times daily. 03/26/24   Tobie Eldora NOVAK, MD  baclofen  (LIORESAL ) 10 MG tablet TAKE 1 TABLET(10 MG)  BY MOUTH THREE TIMES DAILY 12/02/23   Norleen Lynwood ORN, MD  BD PEN NEEDLE NANO 2ND GEN 32G X 4 MM MISC USE AS DIRECTED THREE TIMES DAILY 03/17/21   Norleen Lynwood ORN, MD  Blood Glucose Monitoring Suppl [facility] VERIO) w/Device KIT Use as directed daily E11.9 03/25/23   Norleen Lynwood ORN, MD  diphenoxylate -atropine  (LOMOTIL ) 2.5-0.025 MG tablet Take 1 tablet by mouth 4 (four) times daily as needed for diarrhea or loose stools. 12/07/23   Norleen Lynwood ORN, MD  escitalopram  (LEXAPRO ) 10 MG tablet Take 1 tablet (10 mg total) by mouth daily. 03/13/24 03/13/25  Norleen Lynwood ORN, MD  escitalopram  (LEXAPRO ) 10 MG tablet Take 1 tablet (10 mg total) by mouth daily. 03/13/24   Norleen Lynwood ORN, MD  fluticasone  (FLONASE ) 50 MCG/ACT nasal spray Place 2 sprays into both nostrils daily. 03/26/24   Tobie Eldora NOVAK, MD  fluticasone  (FLONASE ) 50 MCG/ACT nasal spray Place 2 sprays into both nostrils daily. 03/26/24   Tobie Eldora NOVAK, MD  gabapentin  (NEURONTIN ) 300 MG capsule Take 3 capsules (900 mg total) by mouth 3 (three) times daily. 05/24/24   Norleen Lynwood ORN, MD  gabapentin  (NEURONTIN ) 300 MG capsule Take 3 capsules (900 mg total) by mouth 3 (three) times daily. 01/02/24   Norleen Lynwood ORN, MD  gabapentin  (NEURONTIN ) 300 MG capsule Take 3 capsules (900 mg total) by mouth 3 (three) times daily. 05/16/24     Galcanezumab -gnlm (EMGALITY ) 120 MG/ML SOAJ Inject 120 mg into the skin every 28 (twenty-eight) days. 02/14/24   Skeet Juliene SAUNDERS, DO  Galcanezumab -gnlm (EMGALITY ) 120 MG/ML SOAJ Inject 120 mg into the skin every 28 days 02/14/24   Skeet Juliene SAUNDERS, DO  glucose blood (ONETOUCH VERIO) test strip USE AS DIRECTED THREE TIMES DAILY E11.9 03/22/23   Norleen Lynwood ORN, MD  insulin  aspart (NOVOLOG  FLEXPEN) 100 UNIT/ML FlexPen ADMINISTER 12 UNITS UNDER THE SKIN FOUR TIMES DAILY 03/22/23   Norleen Lynwood ORN, MD  levalbuterol  (XOPENEX  HFA) 45 MCG/ACT inhaler Inhale 1-2 puffs into the lungs every 8 (eight) hours as needed. 03/13/24   Shelah Lamar RAMAN, MD  mometasone -formoterol  (DULERA ) 200-5 MCG/ACT AERO Inhale 2 puffs into the lungs in the morning and at bedtime. 05/11/23   Shelah Lamar RAMAN, MD  naloxone  (NARCAN ) nasal spray 4 mg/0.1 mL SMARTSIG:1 Spray(s) Both Nares PRN 01/27/24   [provider]  nystatin  (MYCOSTATIN ) 100000 UNIT/ML suspension Take by mouth. 02/22/24   [provider]  ondansetron  (ZOFRAN -ODT) 4 MG disintegrating tablet Take 1 tablet (4 mg total) by mouth every 8 (eight) hours as needed. 07/26/23   Jerrol Lynwood, MD  ondansetron  (ZOFRAN -ODT) 4 MG disintegrating tablet Take 1 tablet (4 mg total) by mouth every 8 (eight) hours as needed. Patient not taking: Reported on 05/24/2024 11/22/23   Dean Clarity, MD  oxyCODONE -acetaminophen  (PERCOCET) 10-325 MG tablet Take 1 tablet by mouth every 6 (six) hours as needed for pain. Per pain management 09/14/23 09/13/24  Norleen Lynwood ORN, MD  oxyCODONE -acetaminophen  (PERCOCET) 10-325 MG tablet Take 1 tablet by mouth 5 (five) times daily as needed. 05/29/24     pantoprazole  (PROTONIX ) 40 MG tablet Take 1 tablet (40  mg total) by mouth 2 (two) times daily before a meal. 08/17/23 08/16/24  Norleen Lynwood ORN, MD  pantoprazole  (PROTONIX ) 40 MG tablet Take 1 tablet (40 mg total) by mouth 2 (two) times daily before a meal 08/17/23   Norleen Lynwood ORN, MD  potassium chloride  SA (KLOR-CON  M) 20 MEQ tablet  12/28/23  [provider]  Rimegepant Sulfate  (NURTEC) 75 MG TBDP 1 tab by mouth once daily as needed 09/14/23   Norleen Lynwood ORN, MD  Rimegepant Sulfate  (NURTEC) 75 MG TBDP Take 1 tablet (75 mg total) by mouth daily as needed. 09/14/23   Norleen Lynwood ORN, MD  rizatriptan  (MAXALT -MLT) 10 MG disintegrating tablet Take 1 tablet earliest onset of migraine.  May repeat in 2 hours if needed.  Maximum 2 tablets in 24 hours 12/27/22   Skeet Juliene SAUNDERS, DO  rosuvastatin  (CRESTOR ) 40 MG tablet TAKE 1 TABLET BY MOUTH DAILY 05/16/24   Norleen Lynwood ORN, MD  rosuvastatin  (CRESTOR ) 40 MG tablet Take 1 tablet (40 mg total) by mouth daily. 05/16/24   Norleen Lynwood ORN, MD  Semaglutide , 1 MG/DOSE, (OZEMPIC , 1 MG/DOSE,) 4 MG/3ML SOPN Inject 1 mg into the skin once a week. 03/13/24   Norleen Lynwood ORN, MD  Semaglutide , 1 MG/DOSE, 4 MG/3ML SOPN Inject 1 mg as directed once a week. 09/14/23   Norleen Lynwood ORN, MD  Semaglutide ,0.25 or 0.5MG /DOS, (OZEMPIC , 0.25 OR 0.5 MG/DOSE,) 2 MG/3ML SOPN Inject 0.5 mg into the skin once a week. 08/04/23   Norleen Lynwood ORN, MD  sucralfate  (CARAFATE ) 1 g tablet TAKE 1 TABLET(1 GRAM) BY MOUTH FOUR TIMES DAILY AND AT BEDTIME WITH MEALS. 09/20/23   Norleen Lynwood ORN, MD  telmisartan -hydrochlorothiazide  (MICARDIS  HCT) 40-12.5 MG tablet TAKE 1 TABLET BY MOUTH DAILY 09/19/23   Norleen Lynwood ORN, MD  telmisartan -hydrochlorothiazide  (MICARDIS  HCT) 40-12.5 MG tablet Take 1 tablet by mouth daily. 09/19/23     triamcinolone  (NASACORT ) 55 MCG/ACT AERO nasal inhaler Place 2 sprays into the nose daily. 08/17/23   Norleen Lynwood ORN, MD  TURMERIC PO Take 1 tablet by mouth daily.    [provider]  vitamin B-12 (CYANOCOBALAMIN ) 1000 MCG tablet Take 1,000  mcg by mouth daily.    [provider]  Vitamin D , Ergocalciferol , (DRISDOL ) 1.25 MG (50000 UNIT) CAPS capsule Take by mouth. 02/08/20   [provider]  XOPENEX  HFA 45 MCG/ACT inhaler INHALE 1 TO 2 PUFFS INTO THE LUNGS EVERY 8 HOURS AS NEEDED FOR WHEEZING OR SHORTNESS OF BREATH, USE 2 PUFFS 3 TIMES DAILY THEN BACK TO HOME REGIMEN 03/01/24   Shelah Lamar RAMAN, MD  zonisamide  (ZONEGRAN ) 100 MG capsule Take 2 capsules (200 mg total) by mouth daily. 12/27/23   Skeet Juliene SAUNDERS, DO    Family History Family History  Problem Relation Age of Onset   Allergies Mother    Heart attack Mother    Diabetes Mother    Diabetes Father    Heart disease Father    Rheum arthritis Father    Stroke Father    Ovarian cancer Maternal Aunt    Lung cancer Maternal Aunt    Breast cancer Maternal Aunt    Bone cancer Maternal Aunt    Stroke Maternal Uncle    Hypertension Other    Other Neg Hx     Social History Social History   Tobacco Use   Smoking status: Former    Current packs/day: 0.00    Average packs/day: 1 pack/day for 20.0 years (20.0 ttl pk-yrs)    Types: Cigarettes    Start date: 10/25/1992    Quit date: 10/25/2012    Years since quitting: 11.6   Smokeless tobacco: Never   Tobacco comments:    QUIT 04/2010 AND STARTED BACK 2014 X 3 MONTHS. less than 1 ppd.  started at age 89.    Vaping Use  Vaping status: Never Used  Substance Use Topics   Alcohol use: No   Drug use: No     Allergies   Azithromycin, Bee venom, Flagyl  [metronidazole ], Penicillins, Shellfish allergy, Klonopin  [clonazepam ], Cephalexin, and Cephalosporins   Review of Systems Review of Systems  Skin:  Positive for rash.     Physical Exam Triage Vital Signs ED Triage Vitals  Encounter Vitals Group     BP 06/06/24 0913 92/60     Girls Systolic BP Percentile --      Girls Diastolic BP Percentile --      Boys Systolic BP Percentile --      Boys Diastolic BP Percentile --      Pulse Rate 06/06/24 0913 80      Resp 06/06/24 0913 19     Temp 06/06/24 0913 97.9 F (36.6 C)     Temp Source 06/06/24 0913 Oral     SpO2 06/06/24 0913 96 %     Weight 06/06/24 0914 255 lb (115.7 kg)     Height 06/06/24 0914 5' 6 (1.676 m)     Head Circumference --      Peak Flow --      Pain Score 06/06/24 0914 0     Pain Loc --      Pain Education --      Exclude from Growth Chart --    No data found.  Updated Vital Signs BP 92/60 (BP Location: Right Arm)   Pulse 80   Temp 97.9 F (36.6 C) (Oral)   Resp 19   Ht 5' 6 (1.676 m)   Wt 255 lb (115.7 kg)   LMP 07/18/2011   SpO2 96%   BMI 41.16 kg/m   Visual Acuity Right Eye Distance:   Left Eye Distance:   Bilateral Distance:    Right Eye Near:   Left Eye Near:    Bilateral Near:     Physical Exam Vitals reviewed.  Constitutional:      General: She is awake.     Appearance: Normal appearance. She is well-developed and well-groomed.  HENT:     Head: Normocephalic and atraumatic.  Eyes:     General: Lids are normal. Gaze aligned appropriately.     Extraocular Movements: Extraocular movements intact.     Conjunctiva/sclera: Conjunctivae normal.  Pulmonary:     Effort: Pulmonary effort is normal.  Skin:    General: Skin is warm and dry.     Findings: Rash present. Rash is macular and papular.     Comments: Pt has areas of maculopapular rash along the upper right arm with evidence of punctate openings and tunneling   Neurological:     Mental Status: She is alert and oriented to person, place, and time.  Psychiatric:        Attention and Perception: Attention and perception normal.        Mood and Affect: Mood and affect normal.        Speech: Speech normal.        Behavior: Behavior normal. Behavior is cooperative.      UC Treatments / Results  Labs (all labs ordered are listed, but only abnormal results are displayed) Labs Reviewed - No data to display  EKG   Radiology No results found.  Procedures Procedures (including  critical care time)  Medications Ordered in UC Medications - No data to display  Initial Impression / Assessment and Plan / UC Course  I have reviewed the triage vital signs and  the nursing notes.  Pertinent labs & imaging results that were available during my care of the patient were reviewed by me and considered in my medical decision making (see chart for details).      Final Clinical Impressions(s) / UC Diagnoses   Final diagnoses:  Rash and nonspecific skin eruption  Scabies   Patient presents today with concerns for an itchy rash.  She states that most notable areas are on the right upper arm but she feels itchy all over.  She has tried multiple home remedies which have not provided adequate relief.  Physical exam reveals several maculopapular lesions on the upper right arm with tiling which appears consistent with suspected scabies.  Will send patient a prescription for permethrin  cream.  Discussed application instructions as well as further environmental controls to help prevent reinfection or further spread.  Recommend second-generation antihistamine to help with itching until resolution.  Follow-up with PCP return to urgent care if symptoms or not improving or seem to be worsening.    Discharge Instructions      You were seen today for concerns of a rash that is very itchy.  At this time I believe you have something called scabies. To help with the itching I recommend taking a second-generation antihistamine such as Claritin , or Zyrtec  per your preference. To help treat the scabies I have sent you in a prescription for a medication called permethrin  cream.  Please apply this from the neck  to soles of feet  at bedtime and leave it on overnight. Please make an attempt to get the cream under your fingernails and toenails as well.   You then wash it off in the morning.  Usually a single treatment is sufficient to treat scabies but if needed you can reapply it in one to two weeks  for a second treatment.  Any clothing or linens that you have worn in the past week should be washed and dried on the hottest setting that they can tolerate or isolated for a week and a tightly sealed bag. If you feel like your symptoms are not improving or seem to be worsening you can always return here or follow-up with your PCP for further evaluation and ongoing management.      ED Prescriptions     Medication Sig Dispense Auth. Provider   permethrin  (ELIMITE ) 5 % cream Apply to affected area once.  Leave on for 8-14 hours as directed by physician.  Wash off with soapy water. 60 g Kouper Spinella E, PA-C      PDMP not reviewed this encounter.   Marylene Rocky BRAVO, PA-C 06/06/24 1303

## 2024-06-09 ENCOUNTER — Other Ambulatory Visit (HOSPITAL_COMMUNITY): Payer: Self-pay

## 2024-06-11 ENCOUNTER — Other Ambulatory Visit (HOSPITAL_COMMUNITY): Payer: Self-pay

## 2024-06-11 ENCOUNTER — Other Ambulatory Visit: Payer: Self-pay

## 2024-06-12 ENCOUNTER — Ambulatory Visit (INDEPENDENT_AMBULATORY_CARE_PROVIDER_SITE_OTHER): Admitting: Otolaryngology

## 2024-06-19 ENCOUNTER — Other Ambulatory Visit (HOSPITAL_COMMUNITY): Payer: Self-pay

## 2024-06-20 ENCOUNTER — Other Ambulatory Visit (HOSPITAL_COMMUNITY): Payer: Self-pay

## 2024-06-21 ENCOUNTER — Other Ambulatory Visit (HOSPITAL_COMMUNITY): Payer: Self-pay

## 2024-06-28 ENCOUNTER — Other Ambulatory Visit: Payer: Self-pay

## 2024-06-28 ENCOUNTER — Other Ambulatory Visit (HOSPITAL_COMMUNITY): Payer: Self-pay

## 2024-06-28 MED ORDER — OXYCODONE-ACETAMINOPHEN 10-325 MG PO TABS
1.0000 | ORAL_TABLET | Freq: Every day | ORAL | 0 refills | Status: DC | PRN
  Filled 2024-06-28: qty 150, 30d supply, fill #0

## 2024-06-29 NOTE — Progress Notes (Signed)
 NEUROLOGY FOLLOW UP OFFICE NOTE  Nicole Melendez [id number]  Assessment/Plan:   Migraine without aura, without status migrainosus, not intractable     1.  Headache cocktail today 2.  Migraine prevention: Emgality  every 4 months; zonisamide  200mg  daily for now.  3. Migraine rescue:  will have her try samples of Ubrelvy 100mg ; Zofran  4mg  for nausea.  4.  Limit use of pain relievers to no more than 2 days out of week to prevent risk of rebound or medication-overuse headache. 5.  Keep headache diary 6.  Follow up in 8 months.   Subjective:  Nicole Melendez is a 48 year old right-handed female with sarcoidosis, diabetes with neuropathy, GERD, PUD, HTN, and hyperlipidemia who follows up for migraines and lumbar radiculopathy.     MIGRAINE: UPDATE: Started Emgality .  Effective but has trouble getting it on time due to pharmacy not getting it on time.  Emgality  makes her sleepy if she is having a migraine. Intensity:  7-9/10 Duration:  2 days with Nurtec. Frequency:  2 times a month Since she wasn't able to pick up Emgality  before the weekend, she has had a migraine for 3 days   Current NSAIDS: ASA 81mg  PRN, limits due to PUD Current analgesics: Tylenol , Percocet (pain) Current triptans: none Current ergotamine: None Current anti-emetic: none Current muscle relaxants: none Current anti-anxiolytic: Alprazolam , hydroxyzine  Current sleep aide: None Current Antihypertensive medications: telmisartan -HCTZ Current Antidepressant medications: Lexapro  10mg  Current Anticonvulsant medications: Zonisamide  200 mg, gabapentin  900mg  TID   Current anti-CGRP: Emgality , Nurtec PRN Current Vitamins/Herbal/Supplements: Folic acid , D3, turmeric, B12 Current Antihistamines/Decongestants: Flonase , Astelin  Other therapy: None     Caffeine : No Alcohol: No Smoker: No Diet: Hydrates, baked chicken, steamed vegetables Exercise: Yes Depression: Yes; Anxiety: Yes.  Both controlled. Other pain: back  pain.  May receive ablation.  Some mild residual knee pain since the surgery last year. Sleep hygiene: Good   HISTORY: Onset:  Mid-30s Location:  Back of head, right sided Quality:  pounding Initial Intensity:  8-9/10 Aura:  no Prodrome:  no Associated symptoms: Nausea, vomiting, osmophobia, right eye blurred vision, sometimes photophobia.  There is no associated unilateral numbness or weakness. Initial Duration:  2 days (within one hour with sumatriptan ) Initial Frequency:  Varies.  Some months 1-2 days, some months 10 days Triggers:  Unknown Relieving factors:  no Activity:  Cannot function 1 to 2 days per month   Past NSAIDS:  Ibuprofen , naproxen  (discontinued due to stomach ulcers) Past analgesics:  Percocet, tramadol  (for back pain) Past abortive triptans:  sumatriptan  100mg , rizatriptan  10mg  Past muscle relaxants:  tizanidine  4mg , Flexeril , baclofen  Past anti-emetic:  Phenergan , Zofran  Past antihypertensive medications: Lasix  Past antidepressant medications:  Sertraline , Wellbutrin  Past anticonvulsant medications:  topiramate  (caused nausea and vomiting) Past CGRP inhibitors:  none Past vitamins/Herbal/Supplements:  no Past antihistamines/decongestants:  Zyrtec  Other past therapies:  meditation  Family history of headache:  mother    RIGHT LUMBAR RADICULOPATHY: UPDATE: Followed by pain management.  Receiving injections in the back.  Right leg is becoming stronger.  Walking over a mile a day.     HISTORY: She has history of chronic low back pain with lumbosacral spondylosis.  On 03/14/19, she underwent radiofrequency neurolysis of the left lumbar facet nerves at L2-L3, L3-L4, L4-L5 and L5-S1 levels.  She had no complications.  On 04/23/19, she underwent the same procedure on the right side at the same levels. She noted some right leg weakness afterwards with some pain.  It progressively got worse.  When  she is on her feet, the right lower back pain increases and her leg gives  out but no leg pain.  No numbness in right leg but notes new numbness in the big toe of the left foot.  Due to the weakness, she started having falls.  She also notes that sometimes when wakes up in the morning, she has trouble making it to the bathroom to urinate.  She has had incontinence.  No perineal numbness.  She now resorts to using a cane to ambulate.  She had NCV-EMG of the lower extremities on 05/22/19, which demonstrated diminished amplitude of both tibaial motor nerves and absent F-wave response in the right peroneal motor nerve but otherwise unremarkable with no explanation for symptoms.  MRI of lumbar spine without contrast on 06/09/19 showed stable degenerative disc disease and facet arthropathy with mild bilateral neuroforaminal stenosis at L4-L5 and moderate neuroforaminal stenosis at L5-S1, but no new structural explanation for symptoms.  Since this started, she reports increased migraines.    CERVICAL RADICULOPATHY: Following fall in January 2022, had symptoms of neck pain and cervical radiculopathy.  MRI of cervical spine on 02/01/2021 personally reviewed showed cervical spondylosis most prominent from C4 through C7 with right paracentral disc protrusion at C6-7 contacting the right C7 nerve root with severe bilateral neural foraminal narrowing and mild central canal stenosis.Nicole Melendez  PAST MEDICAL HISTORY: Past Medical History:  Diagnosis Date   Allergic rhinitis, cause unspecified    Anemia    Anxiety    Arthritis    COPD (chronic obstructive pulmonary disease) (HCC)    Depression    Diabetes mellitus type II    steroid related, patient states im no longer diabetic   Essential hypertension 08/08/2015   GERD (gastroesophageal reflux disease)    History of blood transfusion    Hyperlipidemia    Menorrhagia    Migraine    Morbid obesity (HCC)    Otitis media 06/28/2015   Peptic ulcer disease    Pneumonia    Positive ANA (antinuclear antibody) 02/14/2012   Sarcoid    including  hand per rheumatology-Dr. Mai   Sarcoidosis of lung (HCC)    Shortness of breath    on exertion   Varicose veins with pain     MEDICATIONS: Current Outpatient Medications on File Prior to Visit  Medication Sig Dispense Refill   acetaminophen  (TYLENOL ) 650 MG CR tablet Take 650-1,300 mg by mouth every 8 (eight) hours as needed for pain.     ALPRAZolam  (XANAX ) 0.5 MG tablet TAKE 1 TABLET(0.5 MG) BY MOUTH TWICE DAILY AS NEEDED FOR ANXIETY 60 tablet 1   azelastine  (ASTELIN ) 0.1 % nasal spray Place 2 sprays into both nostrils 2 (two) times daily. Use in each nostril as directed 30 mL 12   azelastine  (ASTELIN ) 0.1 % nasal spray Place 2 sprays into both nostrils 2 (two) times daily. 30 mL 12   baclofen  (LIORESAL ) 10 MG tablet TAKE 1 TABLET(10 MG) BY MOUTH THREE TIMES DAILY 60 tablet 1   BD PEN NEEDLE NANO 2ND GEN 32G X 4 MM MISC USE AS DIRECTED THREE TIMES DAILY 300 each 3   Blood Glucose Monitoring Suppl (ONETOUCH VERIO) w/Device KIT Use as directed daily E11.9 1 kit 0   diphenoxylate -atropine  (LOMOTIL ) 2.5-0.025 MG tablet Take 1 tablet by mouth 4 (four) times daily as needed for diarrhea or loose stools. 30 tablet 0   escitalopram  (LEXAPRO ) 10 MG tablet Take 1 tablet (10 mg total) by mouth daily. 90 tablet 3  escitalopram  (LEXAPRO ) 10 MG tablet Take 1 tablet (10 mg total) by mouth daily. 90 tablet 3   fluticasone  (FLONASE ) 50 MCG/ACT nasal spray Place 2 sprays into both nostrils daily. 16 g 6   fluticasone  (FLONASE ) 50 MCG/ACT nasal spray Place 2 sprays into both nostrils daily. 16 g 6   gabapentin  (NEURONTIN ) 300 MG capsule Take 3 capsules (900 mg total) by mouth 3 (three) times daily. 270 capsule 1   gabapentin  (NEURONTIN ) 300 MG capsule Take 3 capsules (900 mg total) by mouth 3 (three) times daily. 270 capsule 1   gabapentin  (NEURONTIN ) 300 MG capsule Take 3 capsules (900 mg total) by mouth 3 (three) times daily. 270 capsule 1   Galcanezumab -gnlm (EMGALITY ) 120 MG/ML SOAJ Inject 120 mg  into the skin every 28 (twenty-eight) days. 1.12 mL 5   Galcanezumab -gnlm (EMGALITY ) 120 MG/ML SOAJ Inject 120 mg into the skin every 28 days 1 mL 5   glucose blood (ONETOUCH VERIO) test strip USE AS DIRECTED THREE TIMES DAILY E11.9 300 strip 11   insulin  aspart (NOVOLOG  FLEXPEN) 100 UNIT/ML FlexPen ADMINISTER 12 UNITS UNDER THE SKIN FOUR TIMES DAILY 15 mL 2   levalbuterol  (XOPENEX  HFA) 45 MCG/ACT inhaler Inhale 1-2 puffs into the lungs every 8 (eight) hours as needed. 15 g 2   mometasone -formoterol  (DULERA ) 200-5 MCG/ACT AERO Inhale 2 puffs into the lungs in the morning and at bedtime. 8.8 g 5   naloxone  (NARCAN ) nasal spray 4 mg/0.1 mL SMARTSIG:1 Spray(s) Both Nares PRN     nystatin  (MYCOSTATIN ) 100000 UNIT/ML suspension Take by mouth.     ondansetron  (ZOFRAN -ODT) 4 MG disintegrating tablet Take 1 tablet (4 mg total) by mouth every 8 (eight) hours as needed. 20 tablet 0   ondansetron  (ZOFRAN -ODT) 4 MG disintegrating tablet Take 1 tablet (4 mg total) by mouth every 8 (eight) hours as needed. (Patient not taking: Reported on 05/24/2024) 20 tablet 0   oxyCODONE -acetaminophen  (PERCOCET) 10-325 MG tablet Take 1 tablet by mouth every 6 (six) hours as needed for pain. Per pain management 120 tablet 0   oxyCODONE -acetaminophen  (PERCOCET) 10-325 MG tablet Take 1 tablet by mouth 5 (five) times daily as needed. 150 tablet 0   oxyCODONE -acetaminophen  (PERCOCET) 10-325 MG tablet Take 1 tablet by mouth 5 (five) times daily as needed. 150 tablet 0   pantoprazole  (PROTONIX ) 40 MG tablet Take 1 tablet (40 mg total) by mouth 2 (two) times daily before a meal. 60 tablet 5   pantoprazole  (PROTONIX ) 40 MG tablet Take 1 tablet (40 mg total) by mouth 2 (two) times daily before a meal 60 tablet 5   permethrin  (ELIMITE ) 5 % cream Apply to affected area once.  Leave on for 8-14 hours as directed by physician.  Wash off with soapy water. 60 g 0   potassium chloride  SA (KLOR-CON  M) 20 MEQ tablet      Rimegepant Sulfate   (NURTEC) 75 MG TBDP 1 tab by mouth once daily as needed 16 tablet 11   Rimegepant Sulfate  (NURTEC) 75 MG TBDP Take 1 tablet (75 mg total) by mouth daily as needed. 16 tablet 11   rizatriptan  (MAXALT -MLT) 10 MG disintegrating tablet Take 1 tablet earliest onset of migraine.  May repeat in 2 hours if needed.  Maximum 2 tablets in 24 hours 9 tablet 11   rosuvastatin  (CRESTOR ) 40 MG tablet TAKE 1 TABLET BY MOUTH DAILY 90 tablet 3   rosuvastatin  (CRESTOR ) 40 MG tablet Take 1 tablet (40 mg total) by mouth daily. 90 tablet 3  Semaglutide , 1 MG/DOSE, (OZEMPIC , 1 MG/DOSE,) 4 MG/3ML SOPN Inject 1 mg into the skin once a week. 3 mL 11   Semaglutide , 1 MG/DOSE, 4 MG/3ML SOPN Inject 1 mg as directed once a week. 3 mL 11   Semaglutide ,0.25 or 0.5MG /DOS, (OZEMPIC , 0.25 OR 0.5 MG/DOSE,) 2 MG/3ML SOPN Inject 0.5 mg into the skin once a week. 3 mL 11   sucralfate  (CARAFATE ) 1 g tablet TAKE 1 TABLET(1 GRAM) BY MOUTH FOUR TIMES DAILY AND AT BEDTIME WITH MEALS. 120 tablet 3   telmisartan -hydrochlorothiazide  (MICARDIS  HCT) 40-12.5 MG tablet TAKE 1 TABLET BY MOUTH DAILY 90 tablet 3   telmisartan -hydrochlorothiazide  (MICARDIS  HCT) 40-12.5 MG tablet Take 1 tablet by mouth daily. 90 tablet 3   triamcinolone  (NASACORT ) 55 MCG/ACT AERO nasal inhaler Place 2 sprays into the nose daily. 16.9 mL 12   TURMERIC PO Take 1 tablet by mouth daily.     vitamin B-12 (CYANOCOBALAMIN ) 1000 MCG tablet Take 1,000 mcg by mouth daily.     Vitamin D , Ergocalciferol , (DRISDOL ) 1.25 MG (50000 UNIT) CAPS capsule Take by mouth.     XOPENEX  HFA 45 MCG/ACT inhaler INHALE 1 TO 2 PUFFS INTO THE LUNGS EVERY 8 HOURS AS NEEDED FOR WHEEZING OR SHORTNESS OF BREATH, USE 2 PUFFS 3 TIMES DAILY THEN BACK TO HOME REGIMEN 15 g 3   zonisamide  (ZONEGRAN ) 100 MG capsule Take 2 capsules (200 mg total) by mouth daily. 60 capsule 5   No current facility-administered medications on file prior to visit.    ALLERGIES: Allergies  Allergen Reactions   Azithromycin  Hives    (z pak) hives   Bee Venom Anaphylaxis   Flagyl  [Metronidazole ] Hives and Shortness Of Breath   Penicillins Shortness Of Breath and Swelling    Has patient had a PCN reaction causing immediate rash, facial/tongue/throat swelling, SOB or lightheadedness with hypotension: Yes Has patient had a PCN reaction causing severe rash involving mucus membranes or skin necrosis: No Has patient had a PCN reaction that required hospitalization No Has patient had a PCN reaction occurring within the last 10 years: No If all of the above answers are NO, then may proceed with Cephalosporin use.  REACTION: swelling and difficulty breathing   Shellfish Allergy Anaphylaxis   Klonopin  [Clonazepam ] Other (See Comments)    Memory difficulty   Cephalexin Hives and Swelling   Cephalosporins Hives    FAMILY HISTORY: Family History  Problem Relation Age of Onset   Allergies Mother    Heart attack Mother    Diabetes Mother    Diabetes Father    Heart disease Father    Rheum arthritis Father    Stroke Father    Ovarian cancer Maternal Aunt    Lung cancer Maternal Aunt    Breast cancer Maternal Aunt    Bone cancer Maternal Aunt    Stroke Maternal Uncle    Hypertension Other    Other Neg Hx       Objective:  Blood pressure 130/88, pulse 97, resp. rate 20, height 5' 6 (1.676 m), weight 258 lb (117 kg), last menstrual period 07/18/2011, SpO2 98%. General: No acute distress.  Patient appears well-groomed.       Juliene Dunnings, DO  CC: Lynwood Rush, MD

## 2024-06-30 ENCOUNTER — Encounter: Payer: Self-pay | Admitting: Internal Medicine

## 2024-07-02 ENCOUNTER — Other Ambulatory Visit: Payer: Self-pay

## 2024-07-02 ENCOUNTER — Encounter: Payer: Self-pay | Admitting: Neurology

## 2024-07-02 ENCOUNTER — Ambulatory Visit (INDEPENDENT_AMBULATORY_CARE_PROVIDER_SITE_OTHER): Admitting: Neurology

## 2024-07-02 ENCOUNTER — Other Ambulatory Visit (HOSPITAL_COMMUNITY): Payer: Self-pay

## 2024-07-02 VITALS — BP 130/88 | HR 97 | Resp 20 | Ht 66.0 in | Wt 258.0 lb

## 2024-07-02 DIAGNOSIS — G43019 Migraine without aura, intractable, without status migrainosus: Secondary | ICD-10-CM

## 2024-07-02 MED ORDER — KETOROLAC TROMETHAMINE 60 MG/2ML IM SOLN
60.0000 mg | Freq: Once | INTRAMUSCULAR | Status: AC
  Administered 2024-07-02: 60 mg via INTRAMUSCULAR

## 2024-07-02 MED ORDER — ONDANSETRON HCL 4 MG PO TABS
4.0000 mg | ORAL_TABLET | Freq: Three times a day (TID) | ORAL | 5 refills | Status: AC | PRN
  Filled 2024-07-02: qty 20, 7d supply, fill #0
  Filled 2024-09-27: qty 20, 7d supply, fill #1

## 2024-07-02 MED ORDER — DIPHENHYDRAMINE HCL 50 MG/ML IJ SOLN
50.0000 mg | Freq: Once | INTRAMUSCULAR | Status: AC
  Administered 2024-07-02: 25 mg via INTRAMUSCULAR

## 2024-07-02 MED ORDER — POTASSIUM CHLORIDE ER 10 MEQ PO TBCR
20.0000 meq | EXTENDED_RELEASE_TABLET | Freq: Every day | ORAL | 3 refills | Status: AC
  Filled 2024-07-02: qty 180, 90d supply, fill #0
  Filled 2024-08-04 – 2024-09-30 (×2): qty 180, 90d supply, fill #1

## 2024-07-02 MED ORDER — ZONISAMIDE 100 MG PO CAPS
200.0000 mg | ORAL_CAPSULE | Freq: Every day | ORAL | 5 refills | Status: AC
  Filled 2024-07-02: qty 60, 30d supply, fill #0
  Filled 2024-08-23: qty 60, 30d supply, fill #1
  Filled 2024-09-26: qty 60, 30d supply, fill #2
  Filled 2024-10-28 – 2024-11-09 (×2): qty 60, 30d supply, fill #3

## 2024-07-02 MED ORDER — ALPRAZOLAM 0.5 MG PO TABS
0.5000 mg | ORAL_TABLET | Freq: Two times a day (BID) | ORAL | 2 refills | Status: DC | PRN
  Filled 2024-07-02: qty 60, 30d supply, fill #0
  Filled 2024-08-04 – 2024-09-03 (×3): qty 60, 30d supply, fill #1

## 2024-07-02 MED ORDER — METOCLOPRAMIDE HCL 5 MG/ML IJ SOLN
10.0000 mg | Freq: Once | INTRAMUSCULAR | Status: AC
  Administered 2024-07-02: 10 mg via INTRAMUSCULAR

## 2024-07-02 NOTE — Addendum Note (Signed)
 Addended by: OZELL JESUSA PARAS on: 07/02/2024 10:59 AM   Modules accepted: Orders

## 2024-07-02 NOTE — Progress Notes (Signed)
 Medication Samples have been provided to the patient.  Drug name: Holland       Strength: 100 mg        Qty: 4   LOT: [id number]  Exp.Date: 12/26  Dosing instructions: as needed  The patient has been instructed regarding the correct time, dose, and frequency of taking this medication, including desired effects and most common side effects.   Nicole Melendez 10:12 AM 07/02/2024

## 2024-07-02 NOTE — Patient Instructions (Signed)
 Continue Emgality  every 4 weeks and zonisamide  200mg  daily Take Ubrelvy at earliest onset of migraine.  May repeat after 2 hours (maximum 2 tablets in 24 hours).  Let me know if it works Zofran  for nausea Follow up 8 months.

## 2024-07-16 ENCOUNTER — Encounter: Payer: Self-pay | Admitting: Internal Medicine

## 2024-07-16 ENCOUNTER — Encounter: Payer: Self-pay | Admitting: Neurology

## 2024-07-23 ENCOUNTER — Ambulatory Visit: Admitting: Primary Care

## 2024-07-23 DIAGNOSIS — D860 Sarcoidosis of lung: Secondary | ICD-10-CM

## 2024-07-23 MED ORDER — OXYCODONE-ACETAMINOPHEN 10-325 MG PO TABS: 1.0000 | ORAL_TABLET | Freq: Every day | ORAL | Status: AC | PRN

## 2024-07-27 ENCOUNTER — Other Ambulatory Visit (HOSPITAL_COMMUNITY): Payer: Self-pay

## 2024-07-27 DIAGNOSIS — Z6841 Body Mass Index (BMI) 40.0 and over, adult: Secondary | ICD-10-CM | POA: Diagnosis not present

## 2024-07-27 DIAGNOSIS — M539 Dorsopathy, unspecified: Secondary | ICD-10-CM | POA: Diagnosis not present

## 2024-07-27 DIAGNOSIS — M51369 Other intervertebral disc degeneration, lumbar region without mention of lumbar back pain or lower extremity pain: Secondary | ICD-10-CM | POA: Diagnosis not present

## 2024-07-27 DIAGNOSIS — E119 Type 2 diabetes mellitus without complications: Secondary | ICD-10-CM | POA: Diagnosis not present

## 2024-07-27 DIAGNOSIS — M129 Arthropathy, unspecified: Secondary | ICD-10-CM | POA: Diagnosis not present

## 2024-07-27 DIAGNOSIS — M5030 Other cervical disc degeneration, unspecified cervical region: Secondary | ICD-10-CM | POA: Diagnosis not present

## 2024-07-27 DIAGNOSIS — Z79899 Other long term (current) drug therapy: Secondary | ICD-10-CM | POA: Diagnosis not present

## 2024-07-27 DIAGNOSIS — R9431 Abnormal electrocardiogram [ECG] [EKG]: Secondary | ICD-10-CM | POA: Diagnosis not present

## 2024-07-27 MED ORDER — OXYCODONE-ACETAMINOPHEN 10-325 MG PO TABS
1.0000 | ORAL_TABLET | Freq: Every day | ORAL | 0 refills | Status: AC | PRN
  Filled 2024-07-27: qty 150, 30d supply, fill #0

## 2024-08-04 ENCOUNTER — Encounter: Payer: Self-pay | Admitting: Neurology

## 2024-08-04 ENCOUNTER — Other Ambulatory Visit (HOSPITAL_COMMUNITY): Payer: Self-pay

## 2024-08-06 ENCOUNTER — Telehealth: Payer: Self-pay

## 2024-08-06 ENCOUNTER — Encounter (HOSPITAL_COMMUNITY): Payer: Self-pay

## 2024-08-06 ENCOUNTER — Other Ambulatory Visit (HOSPITAL_COMMUNITY): Payer: Self-pay

## 2024-08-06 ENCOUNTER — Telehealth (HOSPITAL_COMMUNITY): Payer: Self-pay

## 2024-08-06 ENCOUNTER — Other Ambulatory Visit: Payer: Self-pay | Admitting: Neurology

## 2024-08-06 MED ORDER — UBRELVY 100 MG PO TABS
1.0000 | ORAL_TABLET | ORAL | 5 refills | Status: AC | PRN
  Filled 2024-08-06 – 2024-08-30 (×3): qty 10, 30d supply, fill #0

## 2024-08-06 NOTE — Telephone Encounter (Signed)
*  [facility]  Pharmacy Patient Advocate Encounter   Received notification from RX Request Messages that prior authorization for Ubrelvy 100mg  is required/requested.   Insurance verification completed.   The patient is insured through [employer].   Per test claim: PA required; PA submitted to above mentioned insurance via Latent Key/confirmation #/EOC [id number] Status is pending

## 2024-08-07 ENCOUNTER — Telehealth: Payer: Self-pay | Admitting: Pharmacy Technician

## 2024-08-07 ENCOUNTER — Other Ambulatory Visit (HOSPITAL_COMMUNITY): Payer: Self-pay

## 2024-08-07 NOTE — Telephone Encounter (Signed)
 Pharmacy Patient Advocate Encounter  Received notification from [employer] that Prior Authorization for Ubrelvy 100MG  tablets   has been DENIED.  Full denial letter will be uploaded to the media tab. See denial reason below.  [facility] reviewed your UBRELVY 100 MG TABLET request for the above-identified member, and it is denied for the following reason: because we did not see what we need to approve the drug you asked for, [place]ra). We may be able to approve this drug in a certain situation (when you have had a headache frequency of less than 15 headache days per month during the prior 6 months). We do not see that this applies to you. We based this decision on your health plan's prior authorization clinical criteria named Migraine Therapy - Calcitonin Gene-Related Inhibitors.

## 2024-08-07 NOTE — Telephone Encounter (Signed)
 Pharmacy Patient Advocate Encounter  Received notification from [employer] that Prior Authorization for UBRELVY 100MG  has been APPROVED from 10.14.25 to 10.14.26. Ran test claim, Copay is $4. This test claim was processed through [facility]- copay amounts may vary at other pharmacies due to pharmacy/plan contracts, or as the patient moves through the different stages of their insurance plan.   PA #/Case ID/Reference #: [id number]

## 2024-08-07 NOTE — Telephone Encounter (Signed)
 PA has been submitted, and telephone encounter has been created. Please see telephone encounter dated 10.14.25.  PA is approved.

## 2024-08-07 NOTE — Telephone Encounter (Signed)
 Pharmacy Patient Advocate Encounter   Received notification from Pt Calls Messages that prior authorization for UBRELVY 100MG  is required/requested.   Insurance verification completed.   The patient is insured through [employer].   Per test claim: PA required; PA submitted to above mentioned insurance via Latent Key/confirmation #/EOC [id number] Status is pending

## 2024-08-08 ENCOUNTER — Other Ambulatory Visit (HOSPITAL_COMMUNITY): Payer: Self-pay

## 2024-08-13 ENCOUNTER — Other Ambulatory Visit (HOSPITAL_COMMUNITY): Payer: Self-pay

## 2024-08-15 ENCOUNTER — Other Ambulatory Visit (HOSPITAL_COMMUNITY): Payer: Self-pay

## 2024-08-18 ENCOUNTER — Other Ambulatory Visit (HOSPITAL_COMMUNITY): Payer: Self-pay

## 2024-08-23 ENCOUNTER — Other Ambulatory Visit (HOSPITAL_COMMUNITY): Payer: Self-pay

## 2024-08-23 ENCOUNTER — Encounter: Payer: Self-pay | Admitting: Pharmacist

## 2024-08-23 ENCOUNTER — Other Ambulatory Visit: Payer: Self-pay

## 2024-08-28 ENCOUNTER — Other Ambulatory Visit: Payer: Self-pay

## 2024-08-30 ENCOUNTER — Other Ambulatory Visit: Payer: Self-pay

## 2024-08-30 ENCOUNTER — Encounter (HOSPITAL_COMMUNITY): Payer: Self-pay

## 2024-09-01 ENCOUNTER — Other Ambulatory Visit (HOSPITAL_COMMUNITY): Payer: Self-pay

## 2024-09-01 DIAGNOSIS — Z79899 Other long term (current) drug therapy: Secondary | ICD-10-CM | POA: Diagnosis not present

## 2024-09-01 DIAGNOSIS — Z6841 Body Mass Index (BMI) 40.0 and over, adult: Secondary | ICD-10-CM | POA: Diagnosis not present

## 2024-09-01 DIAGNOSIS — M51369 Other intervertebral disc degeneration, lumbar region without mention of lumbar back pain or lower extremity pain: Secondary | ICD-10-CM | POA: Diagnosis not present

## 2024-09-01 DIAGNOSIS — M5030 Other cervical disc degeneration, unspecified cervical region: Secondary | ICD-10-CM | POA: Diagnosis not present

## 2024-09-01 DIAGNOSIS — R9431 Abnormal electrocardiogram [ECG] [EKG]: Secondary | ICD-10-CM | POA: Diagnosis not present

## 2024-09-04 ENCOUNTER — Other Ambulatory Visit (HOSPITAL_COMMUNITY): Payer: Self-pay

## 2024-09-05 ENCOUNTER — Other Ambulatory Visit: Payer: Self-pay

## 2024-09-05 ENCOUNTER — Other Ambulatory Visit (HOSPITAL_COMMUNITY): Payer: Self-pay

## 2024-09-06 ENCOUNTER — Encounter: Payer: Self-pay | Admitting: Internal Medicine

## 2024-09-06 DIAGNOSIS — E538 Deficiency of other specified B group vitamins: Secondary | ICD-10-CM

## 2024-09-06 DIAGNOSIS — E559 Vitamin D deficiency, unspecified: Secondary | ICD-10-CM

## 2024-09-06 DIAGNOSIS — E1140 Type 2 diabetes mellitus with diabetic neuropathy, unspecified: Secondary | ICD-10-CM

## 2024-09-06 DIAGNOSIS — E611 Iron deficiency: Secondary | ICD-10-CM

## 2024-09-07 ENCOUNTER — Other Ambulatory Visit (HOSPITAL_COMMUNITY): Payer: Self-pay

## 2024-09-14 ENCOUNTER — Other Ambulatory Visit (HOSPITAL_COMMUNITY): Payer: Self-pay

## 2024-09-17 ENCOUNTER — Other Ambulatory Visit (HOSPITAL_COMMUNITY): Payer: Self-pay

## 2024-09-19 ENCOUNTER — Other Ambulatory Visit (HOSPITAL_COMMUNITY): Payer: Self-pay

## 2024-09-21 ENCOUNTER — Ambulatory Visit

## 2024-09-21 ENCOUNTER — Other Ambulatory Visit: Payer: Self-pay

## 2024-09-21 ENCOUNTER — Ambulatory Visit
Admission: EM | Admit: 2024-09-21 | Discharge: 2024-09-21 | Disposition: A | Discharge status: Home / Self Care | Attending: Physician Assistant | Admitting: Physician Assistant

## 2024-09-21 ENCOUNTER — Ambulatory Visit (INDEPENDENT_AMBULATORY_CARE_PROVIDER_SITE_OTHER): Admitting: Radiology

## 2024-09-21 ENCOUNTER — Telehealth: Payer: Self-pay

## 2024-09-21 DIAGNOSIS — R55 Syncope and collapse: Secondary | ICD-10-CM | POA: Diagnosis not present

## 2024-09-21 DIAGNOSIS — M79631 Pain in right forearm: Secondary | ICD-10-CM

## 2024-09-21 DIAGNOSIS — M545 Low back pain, unspecified: Secondary | ICD-10-CM

## 2024-09-21 NOTE — ED Notes (Signed)
 EKG handed off to [facility] PA.

## 2024-09-21 NOTE — ED Triage Notes (Signed)
 Pt presents with a chief complaint of fall yesterday. States she was in her kitchen yesterday morning at 7 AM. Called her partner into the room. Partner found her on the floor. Partner reports she had to say her name several times for her to awaken. As far as she knows, she did not hit her head. Voicing pain in her right arm and tailbone currently. Rates overall pain a 9/10. Took her prescribed percocet, did not take her pain away completely.

## 2024-09-21 NOTE — ED Provider Notes (Signed)
 [facility]    CSN: [id number] Arrival date & time: 09/21/24  1727      History   Chief Complaint Chief Complaint  Patient presents with  . Dizziness    Intermittent. Was up and walking, guess I fainted yesterday at 7 AM in the morning. Currently experiencing pain in right arm. Does not remember fainting. Remembers partner waking her up while she was on the floor.   . Fall    HPI Nicole Melendez is a 48 y.o. female.  has a past medical history of Allergic rhinitis, cause unspecified, Anemia, Anxiety, Arthritis, COPD (chronic obstructive pulmonary disease) (HCC), Depression, Diabetes mellitus type II, Essential hypertension (08/08/2015), GERD (gastroesophageal reflux disease), History of blood transfusion, Hyperlipidemia, Menorrhagia, Migraine, Morbid obesity (HCC), Otitis media (06/28/2015), Peptic ulcer disease, Pneumonia, Positive ANA (antinuclear antibody) (02/14/2012), Sarcoid, Sarcoidosis of lung, Shortness of breath, and Varicose veins with pain.   HPI  Discussed the use of AI scribe software for clinical note transcription with the patient, who gave verbal consent to proceed.     Past Medical History:  Diagnosis Date  . Allergic rhinitis, cause unspecified   . Anemia   . Anxiety   . Arthritis   . COPD (chronic obstructive pulmonary disease) (HCC)   . Depression   . Diabetes mellitus type II    steroid related, patient states im no longer diabetic  . Essential hypertension 08/08/2015  . GERD (gastroesophageal reflux disease)   . History of blood transfusion   . Hyperlipidemia   . Menorrhagia   . Migraine   . Morbid obesity (HCC)   . Otitis media 06/28/2015  . Peptic ulcer disease   . Pneumonia   . Positive ANA (antinuclear antibody) 02/14/2012  . Sarcoid    including hand per rheumatology-Dr. Mai  . Sarcoidosis of lung   . Shortness of breath    on exertion  . Varicose veins with pain     Patient Active Problem List   Diagnosis  Date Noted  . Abnormal mammogram 05/26/2024  . AGE (acute gastroenteritis) 12/10/2023  . Blurred vision, bilateral 09/17/2023  . Dyspepsia 08/17/2023  . Acute recurrent pancreatitis 08/01/2023  . Left ear pain 07/23/2023  . OSA (obstructive sleep apnea) 06/17/2023  . Constipation 03/23/2023  . Nail disorder 12/25/2022  . Cellulitis of great toe 12/22/2022  . Acute upper respiratory infection 09/06/2022  . Vitamin D  deficiency 10/21/2020  . Right low back pain 10/15/2020  . Recurrent falls while walking 10/15/2020  . COVID-19 virus infection 09/28/2020  . Exposure to COVID-19 virus 07/10/2020  . Viral illness 07/10/2020  . Sinusitis 05/23/2020  . Allergic conjunctivitis 05/23/2020  . Yeast vaginitis 05/23/2020  . Polyarthralgia 02/20/2020  . Tibialis posterior tendon tear, nontraumatic, right 02/08/2020  . Neck pain 12/14/2019  . COPD (chronic obstructive pulmonary disease) (HCC) 11/07/2019  . Suspected COVID-19 virus infection 08/02/2019  . Hematochezia 04/05/2019  . Snoring 11/17/2018  . Eye symptom 08/03/2018  . Right lumbar radiculopathy 05/08/2018  . Chronic pain 10/11/2017  . Sarcoidosis of lung   . Acute on chronic diastolic CHF (congestive heart failure) (HCC) 09/22/2017  . Rheumatoid arthritis (HCC) 09/18/2017  . Chronic diastolic CHF (congestive heart failure) (HCC) 09/18/2017  . Pancreatitis 06/26/2017  . Right ankle sprain 06/26/2017  . Possible exposure to STD 06/15/2017  . Diarrhea 04/10/2017  . Bilateral hand swelling 04/07/2017  . Abdominal pain 04/07/2017  . Abnormality of gait 01/26/2017  . Avulsion fracture of right ankle 12/01/2016  . Ulcer  of esophagus with bleeding   . Gastritis and gastroduodenitis   . Blepharitis of right upper eyelid 10/09/2016  . Peroneal tendinitis of lower leg, right 10/06/2016  . Right ankle pain 09/16/2016  . Dizziness 03/11/2016  . Mixed headache 03/09/2016  . Chronic venous insufficiency 02/24/2016  . Gastroenteritis  12/30/2015  . Headache 12/30/2015  . Epistaxis 12/09/2015  . 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 Cervical muscle strain 12/03/2015  . Right-sided face pain 11/26/2015  . Multiple contusions 11/26/2015  . Neck pain, bilateral posterior 11/26/2015  . Low back pain 11/26/2015  . Spinal stenosis of lumbar region 10/28/2015  . Chronic sciatica of left side 10/28/2015  . DOE (dyspnea on exertion) 09/02/2015  . Essential hypertension 08/08/2015  . Diaphoresis 08/07/2015  . Cough 07/02/2015  . Vaginitis and vulvovaginitis 06/29/2015  . Otitis media 06/28/2015  . Thrush 06/24/2015  . Nausea & vomiting 06/10/2015  . Wheezing 11/14/2014  . Angioedema 03/19/2014  . Vaginitis 11/19/2012  . Smoker 11/19/2012  . Positive ANA (antinuclear antibody) 02/14/2012  . Polyarthritis 02/11/2012  . Migraine 04/05/2011  . Encounter for well adult exam with abnormal findings 02/21/2011  . Allergic rhinitis 02/17/2011  . URTICARIA 09/29/2010  . Depression 09/14/2010  . Diabetes mellitus with neuropathy (HCC) 07/31/2010  . PERIPHERAL EDEMA 07/31/2010  . INSOMNIA-SLEEP DISORDER-UNSPEC 06/19/2010  . MENORRHAGIA 05/15/2010  . Hyperlipidemia 12/17/2009  . Iron deficiency anemia 12/17/2009  . Anxiety state 12/17/2009  . Morbid obesity (HCC) 07/02/2007  . Gastroesophageal reflux disease 07/02/2007  . PEPTIC ULCER DISEASE 07/02/2007    Past Surgical History:  Procedure Laterality Date  . ABDOMINAL HYSTERECTOMY    . COLONOSCOPY WITH PROPOFOL  N/A 10/19/2016   Procedure: COLONOSCOPY WITH PROPOFOL ;  Surgeon: Gustav LULLA Mcgee, MD;  Location: [facility];  Service: Endoscopy;  Laterality: N/A;  . ESOPHAGOGASTRODUODENOSCOPY (EGD) WITH PROPOFOL  N/A 10/19/2016   Procedure: ESOPHAGOGASTRODUODENOSCOPY (EGD) WITH PROPOFOL ;  Surgeon: Gustav LULLA Mcgee, MD;  Location: [facility];  Service: Endoscopy;  Laterality: N/A;  . HERNIA MESH REMOVAL  02/2013  . HERNIA REPAIR    . KNEE ARTHROSCOPY Left 01/15/2022    Procedure: ARTHROSCOPY KNEE;  Surgeon: Kay Kemps, MD;  Location: [facility];  Service: Orthopedics;  Laterality: Left;  . uterine ablation  03/2010  . WISDOM TOOTH EXTRACTION      OB History   No obstetric history on file.      Home Medications    Prior to Admission medications   Medication Sig Start Date End Date Taking? Authorizing Provider  acetaminophen  (TYLENOL ) 650 MG CR tablet Take 650-1,300 mg by mouth every 8 (eight) hours as needed for pain.    [provider]  ALPRAZolam  (XANAX ) 0.5 MG tablet Take 1 tablet (0.5 mg total) by mouth 2 (two) times daily as needed for anxiety. 07/02/24   Norleen Lynwood ORN, MD  azelastine  (ASTELIN ) 0.1 % nasal spray Place 2 sprays into both nostrils 2 (two) times daily. Use in each nostril as directed 03/26/24   Tobie Eldora NOVAK, MD  azelastine  (ASTELIN ) 0.1 % nasal spray Place 2 sprays into both nostrils 2 (two) times daily. 03/26/24   Tobie Eldora NOVAK, MD  BD PEN NEEDLE NANO 2ND GEN 32G X 4 MM MISC USE AS DIRECTED THREE TIMES DAILY 03/17/21   Norleen Lynwood ORN, MD  Blood Glucose Monitoring Suppl [facility] VERIO) w/Device KIT Use as directed daily E11.9 03/25/23   Norleen Lynwood ORN, MD  escitalopram  (LEXAPRO ) 10 MG tablet Take 1 tablet (10 mg total) by mouth  daily. 03/13/24 03/13/25  Norleen Lynwood ORN, MD  escitalopram  (LEXAPRO ) 10 MG tablet Take 1 tablet (10 mg total) by mouth daily. 03/13/24   Norleen Lynwood ORN, MD  fluticasone  (FLONASE ) 50 MCG/ACT nasal spray Place 2 sprays into both nostrils daily. 03/26/24   Tobie Eldora NOVAK, MD  fluticasone  (FLONASE ) 50 MCG/ACT nasal spray Place 2 sprays into both nostrils daily. 03/26/24   Tobie Eldora NOVAK, MD  gabapentin  (NEURONTIN ) 300 MG capsule Take 3 capsules (900 mg total) by mouth 3 (three) times daily. 05/24/24   Norleen Lynwood ORN, MD  gabapentin  (NEURONTIN ) 300 MG capsule Take 3 capsules (900 mg total) by mouth 3 (three) times daily. 01/02/24   Norleen Lynwood ORN, MD  gabapentin  (NEURONTIN ) 300 MG capsule Take 3 capsules (900 mg total) by  mouth 3 (three) times daily. 05/16/24     Galcanezumab -gnlm (EMGALITY ) 120 MG/ML SOAJ Inject 120 mg into the skin every 28 (twenty-eight) days. 02/14/24   Skeet Juliene SAUNDERS, DO  Galcanezumab -gnlm (EMGALITY ) 120 MG/ML SOAJ Inject 120 mg into the skin every 28 days 02/14/24   Skeet Juliene SAUNDERS, DO  glucose blood (ONETOUCH VERIO) test strip USE AS DIRECTED THREE TIMES DAILY E11.9 03/22/23   Norleen Lynwood ORN, MD  insulin  aspart (NOVOLOG  FLEXPEN) 100 UNIT/ML FlexPen ADMINISTER 12 UNITS UNDER THE SKIN FOUR TIMES DAILY 03/22/23   Norleen Lynwood ORN, MD  levalbuterol  (XOPENEX  HFA) 45 MCG/ACT inhaler Inhale 1-2 puffs into the lungs every 8 (eight) hours as needed. 03/13/24   Shelah Lamar RAMAN, MD  naloxone  (NARCAN ) nasal spray 4 mg/0.1 mL SMARTSIG:1 Spray(s) Both Nares PRN 01/27/24   [provider]  nystatin  (MYCOSTATIN ) 100000 UNIT/ML suspension Take by mouth. 02/22/24   [provider]  ondansetron  (ZOFRAN ) 4 MG tablet Take 1 tablet (4 mg total) by mouth every 8 (eight) hours as needed. 07/02/24   Skeet Juliene SAUNDERS, DO  oxyCODONE -acetaminophen  (PERCOCET) 10-325 MG tablet Take 1 tablet by mouth 5 (five) times daily as needed. Pt requests change of manufacturer 07/23/24   Norleen Lynwood ORN, MD  oxyCODONE -acetaminophen  (PERCOCET) 10-325 MG tablet Take 1 tablet by mouth 5 (five) times daily as needed. 07/27/24     pantoprazole  (PROTONIX ) 40 MG tablet Take 1 tablet (40 mg total) by mouth 2 (two) times daily before a meal 08/17/23   Norleen Lynwood ORN, MD  potassium chloride  (KLOR-CON  10) 10 MEQ tablet Take 2 tablets (20 mEq total) by mouth daily. 07/02/24   Norleen Lynwood ORN, MD  potassium chloride  SA (KLOR-CON  M) 20 MEQ tablet  12/28/23   [provider]  rosuvastatin  (CRESTOR ) 40 MG tablet TAKE 1 TABLET BY MOUTH DAILY 05/16/24   Norleen Lynwood ORN, MD  Semaglutide , 1 MG/DOSE, (OZEMPIC , 1 MG/DOSE,) 4 MG/3ML SOPN Inject 1 mg into the skin once a week. 03/13/24   Norleen Lynwood ORN, MD  Semaglutide , 1 MG/DOSE, 4 MG/3ML SOPN Inject 1 mg as directed  once a week. 09/14/23   Norleen Lynwood ORN, MD  Semaglutide ,0.25 or 0.5MG /DOS, (OZEMPIC , 0.25 OR 0.5 MG/DOSE,) 2 MG/3ML SOPN Inject 0.5 mg into the skin once a week. 08/04/23   Norleen Lynwood ORN, MD  telmisartan -hydrochlorothiazide  (MICARDIS  HCT) 40-12.5 MG tablet TAKE 1 TABLET BY MOUTH DAILY 09/19/23   Norleen Lynwood ORN, MD  telmisartan -hydrochlorothiazide  (MICARDIS  HCT) 40-12.5 MG tablet Take 1 tablet by mouth daily. 09/19/23     TURMERIC PO Take 1 tablet by mouth daily.    [provider]  Ubrogepant  (UBRELVY ) 100 MG TABS Take 1 tablet (100 mg total)  by mouth as needed. May repeat after 2 hours.  Maximum 2 tablets in 24 hours. 08/06/24   Jaffe, Adam R, DO  Vitamin D , Ergocalciferol , (DRISDOL ) 1.25 MG (50000 UNIT) CAPS capsule Take by mouth. 02/08/20   [provider]  XOPENEX  HFA 45 MCG/ACT inhaler INHALE 1 TO 2 PUFFS INTO THE LUNGS EVERY 8 HOURS AS NEEDED FOR WHEEZING OR SHORTNESS OF BREATH, USE 2 PUFFS 3 TIMES DAILY THEN BACK TO HOME REGIMEN 03/01/24   Shelah Lamar RAMAN, MD  zonisamide  (ZONEGRAN ) 100 MG capsule Take 2 capsules (200 mg total) by mouth daily. 07/02/24   Skeet Juliene SAUNDERS, DO    Family History Family History  Problem Relation Age of Onset  . Allergies Mother   . Heart attack Mother   . Diabetes Mother   . Diabetes Father   . Heart disease Father   . Rheum arthritis Father   . Stroke Father   . Ovarian cancer Maternal Aunt   . Lung cancer Maternal Aunt   . Breast cancer Maternal Aunt   . Bone cancer Maternal Aunt   . Stroke Maternal Uncle   . Hypertension Other   . Other Neg Hx     Social History Social History   Tobacco Use  . Smoking status: Former    Current packs/day: 0.00    Average packs/day: 1 pack/day for 20.0 years (20.0 ttl pk-yrs)    Types: Cigarettes    Start date: 10/25/1992    Quit date: 10/25/2012    Years since quitting: 11.9  . Smokeless tobacco: Never  . Tobacco comments:    QUIT 04/2010 AND STARTED BACK 2014 X 3 MONTHS. less than 1 ppd.  started at  age 68.    Vaping Use  . Vaping status: Never Used  Substance Use Topics  . Alcohol use: No  . Drug use: No     Allergies   Azithromycin, Bee venom, Flagyl  [metronidazole ], Penicillins, Shellfish allergy, Klonopin  [clonazepam ], Cephalexin, and Cephalosporins   Review of Systems Review of Systems  Eyes:  Negative for visual disturbance.  Gastrointestinal:  Negative for nausea and vomiting.  Musculoskeletal:  Positive for arthralgias and myalgias.       Right forearm pain and bruising  Right hip and tailbone pain and bruising   Neurological:  Positive for dizziness and syncope. Negative for facial asymmetry, speech difficulty, weakness and headaches.  Psychiatric/Behavioral:  Negative for confusion.      Physical Exam Triage Vital Signs ED Triage Vitals  Encounter Vitals Group     BP 09/21/24 1829 96/65     Girls Systolic BP Percentile --      Girls Diastolic BP Percentile --      Boys Systolic BP Percentile --      Boys Diastolic BP Percentile --      Pulse Rate 09/21/24 1829 80     Resp 09/21/24 1829 18     Temp 09/21/24 1829 98.3 F (36.8 C)     Temp Source 09/21/24 1829 Oral     SpO2 09/21/24 1829 98 %     Weight 09/21/24 1831 257 lb 15 oz (117 kg)     Height 09/21/24 1831 5' 6 (1.676 m)     Head Circumference --      Peak Flow --      Pain Score 09/21/24 1831 9     Pain Loc --      Pain Education --      Exclude from Growth Chart --  No data found.  Updated Vital Signs BP 96/65 (BP Location: Right Arm)   Pulse 80   Temp 98.3 F (36.8 C) (Oral)   Resp 18   Ht 5' 6 (1.676 m)   Wt 257 lb 15 oz (117 kg)   LMP 07/18/2011   SpO2 98%   BMI 41.63 kg/m   Visual Acuity Right Eye Distance:   Left Eye Distance:   Bilateral Distance:    Right Eye Near:   Left Eye Near:    Bilateral Near:     Physical Exam   UC Treatments / Results  Labs (all labs ordered are listed, but only abnormal results are displayed) Labs Reviewed  CBC WITH  DIFFERENTIAL/PLATELET  COMPREHENSIVE METABOLIC PANEL WITH GFR    EKG   Radiology DG Forearm Right Result Date: 09/21/2024 EXAM: 2 VIEW(S) XRAY OF THE LATERALITY FOREARM 09/21/2024 07:30:45 PM COMPARISON: None available. CLINICAL HISTORY: back pain after syncope and fall FINDINGS: FINDINGS: BONES AND JOINTS: No acute fracture. No focal osseous lesion. No joint dislocation. SOFT TISSUES: The soft tissues are unremarkable. IMPRESSION: 1. No acute fracture or dislocation. Electronically signed by: Norman Gatlin MD 09/21/2024 07:40 PM EST RP Workstation: [id number]   DG Lumbar Spine Complete Result Date: 09/21/2024 EXAM: 4 VIEW(S) XRAY OF THE LUMBAR SPINE 09/21/2024 07:30:45 PM COMPARISON: CT abdomen and pelvis 11/22/2023. CLINICAL HISTORY: back pain after syncope and fall FINDINGS: LUMBAR SPINE: BONES:  No acute fracture. No aggressive appearing osseous lesion. DISCS AND DEGENERATIVE CHANGES: Mild disc space height loss at L5-S1. Advanced facet arthropathy at L4-L5 and L5-S1. Unchanged chronic anterolisthesis of L4 and L5. Mild multilevel spondylosis. SOFT TISSUES: No acute abnormality. IMPRESSION: 1. No acute abnormality of the lumbar spine. 2. Unchanged chronic anterolisthesis of L4 on L5 and advanced facet arthropathy at L4-L5 and L5-S1. Electronically signed by: Norman Gatlin MD 09/21/2024 07:40 PM EST RP Workstation: [id number]    Procedures ED EKG  Date/Time: 09/21/2024 7:43 PM  Performed by: Marylene Rocky BRAVO, PA-C Authorized by: Marylene Rocky BRAVO, PA-C   Previous ECG:    Previous ECG:  Compared to current   Similarity:  No change   Comparison ECG info:  11/21/2019- no significant changes noted on comparison to previous ekg Interpretation:    Interpretation: normal   Rate:    ECG rate:  69   ECG rate assessment: normal   Rhythm:    Rhythm: sinus rhythm   ST segments:    ST segments:  Normal T waves:    T waves: normal    (including critical care time)  Medications Ordered in  UC Medications - No data to display  Initial Impression / Assessment and Plan / UC Course  I have reviewed the triage vital signs and the nursing notes.  Pertinent labs & imaging results that were available during my care of the patient were reviewed by me and considered in my medical decision making (see chart for details).      Final Clinical Impressions(s) / UC Diagnoses   Final diagnoses:  Syncope, unspecified syncope type  Right forearm pain  Acute midline low back pain without sciatica     Discharge Instructions      VISIT SUMMARY:  You came in today because you fainted yesterday and have been feeling sore since then. You mentioned feeling dizzy before you fainted and that you fell, but you did not hit your head. You have soreness and bruising on your right forearm, right hip, and upper back. You do not have  any headaches, confusion, vision changes, weakness, trouble speaking, or facial drooping. This is the first time you have experienced such an event.  YOUR PLAN:  -SYNCOPE WITH FALL AND CONTUSIONS: Syncope means a temporary loss of consciousness, often described as fainting. You experienced this yesterday, which led to a fall and resulted in bruising and soreness on your right forearm, right hip, and tailbone. We have ordered x-rays of your right forearm and lower back to check for any fractures or injuries. We also performed an EKG to see if there are any heart-related reasons for your fainting. Additionally, we ordered blood work to look for any underlying causes. If you continue to feel dizzy or faint again, please go to the emergency room immediately.  INSTRUCTIONS:  We were unable to complete your blood work here today.  Your imaging did not show evidence of fracture or dislocation.  I do recommend you follow-up with your PCP over the next week for further evaluation and ongoing monitoring as well as to complete your blood work.  If you experience persistent dizziness  or fainting again, go to the emergency room immediately.     ED Prescriptions   None    PDMP not reviewed this encounter.

## 2024-09-21 NOTE — Discharge Instructions (Addendum)
 VISIT SUMMARY:  You came in today because you fainted yesterday and have been feeling sore since then. You mentioned feeling dizzy before you fainted and that you fell, but you did not hit your head. You have soreness and bruising on your right forearm, right hip, and upper back. You do not have any headaches, confusion, vision changes, weakness, trouble speaking, or facial drooping. This is the first time you have experienced such an event.  YOUR PLAN:  -SYNCOPE WITH FALL AND CONTUSIONS: Syncope means a temporary loss of consciousness, often described as fainting. You experienced this yesterday, which led to a fall and resulted in bruising and soreness on your right forearm, right hip, and tailbone. We have ordered x-rays of your right forearm and lower back to check for any fractures or injuries. We also performed an EKG to see if there are any heart-related reasons for your fainting. Additionally, we ordered blood work to look for any underlying causes. If you continue to feel dizzy or faint again, please go to the emergency room immediately.  INSTRUCTIONS:  We were unable to complete your blood work here today.  Your imaging did not show evidence of fracture or dislocation.  I do recommend you follow-up with your PCP over the next week for further evaluation and ongoing monitoring as well as to complete your blood work.  If you experience persistent dizziness or fainting again, go to the emergency room immediately.

## 2024-09-21 NOTE — Telephone Encounter (Signed)
 This RN called patient at this time. Name and DOB verified. Pt made an appointment here at [facility]. States she fainted. Did not hit her head however does state she may have lost consciousness. Advised pt to come in now for provider evaluation or go to the nearest ED if symptoms occur again/worsen. Pt verbalized understanding.

## 2024-09-21 NOTE — ED Notes (Signed)
 Per Rocky PA, pt is to follow-up with primary care for blood draw.

## 2024-09-25 ENCOUNTER — Ambulatory Visit

## 2024-09-26 ENCOUNTER — Other Ambulatory Visit: Payer: Self-pay

## 2024-09-27 ENCOUNTER — Other Ambulatory Visit: Payer: Self-pay | Admitting: Internal Medicine

## 2024-09-27 ENCOUNTER — Other Ambulatory Visit: Payer: Self-pay

## 2024-09-27 ENCOUNTER — Other Ambulatory Visit (HOSPITAL_COMMUNITY): Payer: Self-pay

## 2024-09-27 MED ORDER — OZEMPIC (1 MG/DOSE) 4 MG/3ML ~~LOC~~ SOPN
1.0000 mg | PEN_INJECTOR | SUBCUTANEOUS | 11 refills | Status: DC
  Filled 2024-09-27: qty 3, 28d supply, fill #0
  Filled 2024-10-28 – 2024-11-09 (×2): qty 3, 28d supply, fill #1

## 2024-09-29 DIAGNOSIS — Z6841 Body Mass Index (BMI) 40.0 and over, adult: Secondary | ICD-10-CM | POA: Diagnosis not present

## 2024-09-29 DIAGNOSIS — M5030 Other cervical disc degeneration, unspecified cervical region: Secondary | ICD-10-CM | POA: Diagnosis not present

## 2024-09-29 DIAGNOSIS — R9431 Abnormal electrocardiogram [ECG] [EKG]: Secondary | ICD-10-CM | POA: Diagnosis not present

## 2024-09-29 DIAGNOSIS — Z79899 Other long term (current) drug therapy: Secondary | ICD-10-CM | POA: Diagnosis not present

## 2024-09-29 DIAGNOSIS — M51369 Other intervertebral disc degeneration, lumbar region without mention of lumbar back pain or lower extremity pain: Secondary | ICD-10-CM | POA: Diagnosis not present

## 2024-10-01 ENCOUNTER — Other Ambulatory Visit: Payer: Self-pay

## 2024-10-01 ENCOUNTER — Other Ambulatory Visit (HOSPITAL_COMMUNITY): Payer: Self-pay

## 2024-10-03 ENCOUNTER — Ambulatory Visit: Admitting: Internal Medicine

## 2024-10-03 ENCOUNTER — Ambulatory Visit: Payer: Self-pay | Admitting: Internal Medicine

## 2024-10-03 ENCOUNTER — Encounter: Payer: Self-pay | Admitting: Internal Medicine

## 2024-10-03 ENCOUNTER — Other Ambulatory Visit (HOSPITAL_COMMUNITY): Payer: Self-pay

## 2024-10-03 VITALS — BP 130/82 | HR 84 | Temp 98.8°F | Ht 66.0 in | Wt 267.0 lb

## 2024-10-03 DIAGNOSIS — R55 Syncope and collapse: Secondary | ICD-10-CM | POA: Insufficient documentation

## 2024-10-03 DIAGNOSIS — E559 Vitamin D deficiency, unspecified: Secondary | ICD-10-CM | POA: Diagnosis not present

## 2024-10-03 DIAGNOSIS — E7849 Other hyperlipidemia: Secondary | ICD-10-CM | POA: Diagnosis not present

## 2024-10-03 DIAGNOSIS — D509 Iron deficiency anemia, unspecified: Secondary | ICD-10-CM

## 2024-10-03 DIAGNOSIS — E611 Iron deficiency: Secondary | ICD-10-CM

## 2024-10-03 DIAGNOSIS — E1140 Type 2 diabetes mellitus with diabetic neuropathy, unspecified: Secondary | ICD-10-CM

## 2024-10-03 LAB — CBC WITH DIFFERENTIAL/PLATELET
Basophils Absolute: 0.1 K/uL (ref 0.0–0.1)
Basophils Relative: 0.9 % (ref 0.0–3.0)
Eosinophils Absolute: 0.2 K/uL (ref 0.0–0.7)
Eosinophils Relative: 3 % (ref 0.0–5.0)
HCT: 38.2 % (ref 36.0–46.0)
Hemoglobin: 12.9 g/dL (ref 12.0–15.0)
Lymphocytes Relative: 34.3 % (ref 12.0–46.0)
Lymphs Abs: 2.2 K/uL (ref 0.7–4.0)
MCHC: 33.8 g/dL (ref 30.0–36.0)
MCV: 90.9 fl (ref 78.0–100.0)
Monocytes Absolute: 0.4 K/uL (ref 0.1–1.0)
Monocytes Relative: 5.9 % (ref 3.0–12.0)
Neutro Abs: 3.6 K/uL (ref 1.4–7.7)
Neutrophils Relative %: 55.9 % (ref 43.0–77.0)
Platelets: 251 K/uL (ref 150.0–400.0)
RBC: 4.2 Mil/uL (ref 3.87–5.11)
RDW: 13.7 % (ref 11.5–15.5)
WBC: 6.4 K/uL (ref 4.0–10.5)

## 2024-10-03 LAB — HEMOGLOBIN A1C: Hgb A1c MFr Bld: 5.7 % (ref 4.6–6.5)

## 2024-10-03 LAB — IBC PANEL
Iron: 60 ug/dL (ref 42–145)
Saturation Ratios: 18.9 % — ABNORMAL LOW (ref 20.0–50.0)
TIBC: 317.8 ug/dL (ref 250.0–450.0)
Transferrin: 227 mg/dL (ref 212.0–360.0)

## 2024-10-03 LAB — HEPATIC FUNCTION PANEL
ALT: 8 U/L (ref 0–35)
AST: 13 U/L (ref 0–37)
Albumin: 4 g/dL (ref 3.5–5.2)
Alkaline Phosphatase: 107 U/L (ref 39–117)
Bilirubin, Direct: 0.2 mg/dL (ref 0.0–0.3)
Total Bilirubin: 0.6 mg/dL (ref 0.2–1.2)
Total Protein: 6.8 g/dL (ref 6.0–8.3)

## 2024-10-03 LAB — BASIC METABOLIC PANEL WITH GFR
BUN: 10 mg/dL (ref 6–23)
CO2: 30 meq/L (ref 19–32)
Calcium: 9.4 mg/dL (ref 8.4–10.5)
Chloride: 105 meq/L (ref 96–112)
Creatinine, Ser: 1.04 mg/dL (ref 0.40–1.20)
GFR: 62.57 mL/min (ref >60.00)
Glucose, Bld: 80 mg/dL (ref 70–99)
Potassium: 4.1 meq/L (ref 3.5–5.1)
Sodium: 140 meq/L (ref 135–145)

## 2024-10-03 LAB — LIPID PANEL
Cholesterol: 111 mg/dL (ref 0–200)
HDL: 42.3 mg/dL (ref >39.00)
LDL Cholesterol: 55 mg/dL (ref 0–99)
NonHDL: 68.92
Total CHOL/HDL Ratio: 3
Triglycerides: 68 mg/dL (ref 0.0–149.0)
VLDL: 13.6 mg/dL (ref 0.0–40.0)

## 2024-10-03 LAB — VITAMIN D 25 HYDROXY (VIT D DEFICIENCY, FRACTURES): VITD: 16.99 ng/mL — ABNORMAL LOW (ref 30.00–100.00)

## 2024-10-03 LAB — FERRITIN: Ferritin: 41.9 ng/mL (ref 10.0–291.0)

## 2024-10-03 MED ORDER — ALPRAZOLAM 0.5 MG PO TABS
0.5000 mg | ORAL_TABLET | Freq: Two times a day (BID) | ORAL | 2 refills | Status: AC | PRN
  Filled 2024-10-03 – 2024-10-08 (×6): qty 60, 30d supply, fill #0
  Filled 2024-11-06: qty 60, 30d supply, fill #1

## 2024-10-03 NOTE — Patient Instructions (Signed)
 Please continue all other medications as before, and refills have been done for xanax   Please have the pharmacy call with any other refills you may need.  Please continue your efforts at being more active, low cholesterol diet, and weight control.  You are otherwise up to date with prevention measures today.  Please keep your appointments with your specialists as you may have planned  You will be contacted regarding the referral for: Echocardiogram, Carotid ultrasound, and MRI brain  Please go to the LAB at the blood drawing area for the tests to be done  You will be contacted by phone if any changes need to be made immediately.  Otherwise, you will receive a letter about your results with an explanation, but please check with MyChart first.  Please make an Appointment to return in 6 months, or sooner if needed

## 2024-10-03 NOTE — Progress Notes (Unsigned)
 Patient ID: Nicole Melendez, female   DOB: Feb 04, 1976, 48 y.o.   MRN: [id number]        Chief Complaint: follow up syncopal episode with nausea diarrhea, fall with multiple bruising, iron deficiency, anxiety, low vit d, dm, htn, hld       HPI:  Nicole Melendez is a 48 y.o. female here after episode of syncope in the bathroom after nausea and diarrhea symptoms now improved, but stood up lightheaded and had syncope for a short while.  Has several bruising to arms and legs but no other injury.  No HA  Pt denies chest pain, increased sob or doe, wheezing, orthopnea, PND, increased LE swelling, palpitations, and  Pt denies polydipsia, polyuria, or new focal neuro s/s.   No recent overt bleeding.  Denies worsening depressive symptoms, suicidal ideation, or panic; has ongoing anxiety, asking for xanax  refill.         Wt Readings from Last 3 Encounters:  10/03/24 267 lb (121.1 kg)  09/21/24 257 lb 15 oz (117 kg)  07/02/24 258 lb (117 kg)   BP Readings from Last 3 Encounters:  10/03/24 130/82  09/21/24 96/65  07/02/24 130/88         Past Medical History:  Diagnosis Date   Allergic rhinitis, cause unspecified    Anemia    Anxiety    Arthritis    COPD (chronic obstructive pulmonary disease) (HCC)    Depression    Diabetes mellitus type II    steroid related, patient states im no longer diabetic   Essential hypertension 08/08/2015   GERD (gastroesophageal reflux disease)    History of blood transfusion    Hyperlipidemia    Menorrhagia    Migraine    Morbid obesity (HCC)    Otitis media 06/28/2015   Peptic ulcer disease    Pneumonia    Positive ANA (antinuclear antibody) 02/14/2012   Sarcoid    including hand per rheumatology-Dr. Mai   Sarcoidosis of lung    Shortness of breath    on exertion   Varicose veins with pain    Past Surgical History:  Procedure Laterality Date   ABDOMINAL HYSTERECTOMY     COLONOSCOPY WITH PROPOFOL  N/A 10/19/2016   Procedure: COLONOSCOPY WITH  PROPOFOL ;  Surgeon: Gustav LULLA Mcgee, MD;  Location: [facility];  Service: Endoscopy;  Laterality: N/A;   ESOPHAGOGASTRODUODENOSCOPY (EGD) WITH PROPOFOL  N/A 10/19/2016   Procedure: ESOPHAGOGASTRODUODENOSCOPY (EGD) WITH PROPOFOL ;  Surgeon: Gustav LULLA Mcgee, MD;  Location: [facility];  Service: Endoscopy;  Laterality: N/A;   HERNIA MESH REMOVAL  02/2013   HERNIA REPAIR     KNEE ARTHROSCOPY Left 01/15/2022   Procedure: ARTHROSCOPY KNEE;  Surgeon: Kay Kemps, MD;  Location: [facility];  Service: Orthopedics;  Laterality: Left;   uterine ablation  03/2010   WISDOM TOOTH EXTRACTION      reports that she quit smoking about 11 years ago. Her smoking use included cigarettes. She started smoking about 31 years ago. She has a 20 pack-year smoking history. She has never used smokeless tobacco. She reports that she does not drink alcohol and does not use drugs. family history includes Allergies in her mother; Bone cancer in her maternal aunt; Breast cancer in her maternal aunt; Diabetes in her father and mother; Heart attack in her mother; Heart disease in her father; Hypertension in an other family member; Lung cancer in her maternal aunt; Ovarian cancer in her maternal aunt; Rheum arthritis in her father; Stroke in her father and maternal  uncle. Allergies  Allergen Reactions   Azithromycin Hives    (z pak) hives   Bee Venom Anaphylaxis   Flagyl  [Metronidazole ] Hives and Shortness Of Breath   Penicillins Shortness Of Breath and Swelling    Has patient had a PCN reaction causing immediate rash, facial/tongue/throat swelling, SOB or lightheadedness with hypotension: Yes Has patient had a PCN reaction causing severe rash involving mucus membranes or skin necrosis: No Has patient had a PCN reaction that required hospitalization No Has patient had a PCN reaction occurring within the last 10 years: No If all of the above answers are NO, then may proceed with Cephalosporin use.  REACTION: swelling and  difficulty breathing   Shellfish Allergy Anaphylaxis   Klonopin  [Clonazepam ] Other (See Comments)    Memory difficulty   Cephalexin Hives and Swelling   Cephalosporins Hives   Current Outpatient Medications on File Prior to Visit  Medication Sig Dispense Refill   acetaminophen  (TYLENOL ) 650 MG CR tablet Take 650-1,300 mg by mouth every 8 (eight) hours as needed for pain.     azelastine  (ASTELIN ) 0.1 % nasal spray Place 2 sprays into both nostrils 2 (two) times daily. Use in each nostril as directed 30 mL 12   azelastine  (ASTELIN ) 0.1 % nasal spray Place 2 sprays into both nostrils 2 (two) times daily. 30 mL 12   BD PEN NEEDLE NANO 2ND GEN 32G X 4 MM MISC USE AS DIRECTED THREE TIMES DAILY 300 each 3   Blood Glucose Monitoring Suppl (ONETOUCH VERIO) w/Device KIT Use as directed daily E11.9 1 kit 0   escitalopram  (LEXAPRO ) 10 MG tablet Take 1 tablet (10 mg total) by mouth daily. 90 tablet 3   escitalopram  (LEXAPRO ) 10 MG tablet Take 1 tablet (10 mg total) by mouth daily. 90 tablet 3   fluticasone  (FLONASE ) 50 MCG/ACT nasal spray Place 2 sprays into both nostrils daily. 16 g 6   fluticasone  (FLONASE ) 50 MCG/ACT nasal spray Place 2 sprays into both nostrils daily. 16 g 6   gabapentin  (NEURONTIN ) 300 MG capsule Take 3 capsules (900 mg total) by mouth 3 (three) times daily. 270 capsule 1   gabapentin  (NEURONTIN ) 300 MG capsule Take 3 capsules (900 mg total) by mouth 3 (three) times daily. 270 capsule 1   gabapentin  (NEURONTIN ) 300 MG capsule Take 3 capsules (900 mg total) by mouth 3 (three) times daily. 270 capsule 1   Galcanezumab -gnlm (EMGALITY ) 120 MG/ML SOAJ Inject 120 mg into the skin every 28 (twenty-eight) days. 1.12 mL 5   Galcanezumab -gnlm (EMGALITY ) 120 MG/ML SOAJ Inject 120 mg into the skin every 28 days 1 mL 5   glucose blood (ONETOUCH VERIO) test strip USE AS DIRECTED THREE TIMES DAILY E11.9 300 strip 11   insulin  aspart (NOVOLOG  FLEXPEN) 100 UNIT/ML FlexPen ADMINISTER 12 UNITS UNDER  THE SKIN FOUR TIMES DAILY 15 mL 2   levalbuterol  (XOPENEX  HFA) 45 MCG/ACT inhaler Inhale 1-2 puffs into the lungs every 8 (eight) hours as needed. 15 g 2   naloxone  (NARCAN ) nasal spray 4 mg/0.1 mL SMARTSIG:1 Spray(s) Both Nares PRN     nystatin  (MYCOSTATIN ) 100000 UNIT/ML suspension Take by mouth.     ondansetron  (ZOFRAN ) 4 MG tablet Take 1 tablet (4 mg total) by mouth every 8 (eight) hours as needed. 20 tablet 5   oxyCODONE -acetaminophen  (PERCOCET) 10-325 MG tablet Take 1 tablet by mouth 5 (five) times daily as needed. Pt requests change of manufacturer     oxyCODONE -acetaminophen  (PERCOCET) 10-325 MG tablet Take 1 tablet by  mouth 5 (five) times daily as needed. 150 tablet 0   pantoprazole  (PROTONIX ) 40 MG tablet Take 1 tablet (40 mg total) by mouth 2 (two) times daily before a meal 60 tablet 5   potassium chloride  (KLOR-CON  10) 10 MEQ tablet Take 2 tablets (20 mEq total) by mouth daily. 180 tablet 3   potassium chloride  SA (KLOR-CON  M) 20 MEQ tablet      rosuvastatin  (CRESTOR ) 40 MG tablet TAKE 1 TABLET BY MOUTH DAILY 90 tablet 3   Semaglutide , 1 MG/DOSE, (OZEMPIC , 1 MG/DOSE,) 4 MG/3ML SOPN Inject 1 mg into the skin once a week. 3 mL 11   Semaglutide , 1 MG/DOSE, (OZEMPIC , 1 MG/DOSE,) 4 MG/3ML SOPN Inject 1 mg as directed once a week. 3 mL 11   Semaglutide ,0.25 or 0.5MG /DOS, (OZEMPIC , 0.25 OR 0.5 MG/DOSE,) 2 MG/3ML SOPN Inject 0.5 mg into the skin once a week. 3 mL 11   telmisartan -hydrochlorothiazide  (MICARDIS  HCT) 40-12.5 MG tablet TAKE 1 TABLET BY MOUTH DAILY 90 tablet 3   telmisartan -hydrochlorothiazide  (MICARDIS  HCT) 40-12.5 MG tablet Take 1 tablet by mouth daily. 90 tablet 3   TURMERIC PO Take 1 tablet by mouth daily.     Ubrogepant  (UBRELVY ) 100 MG TABS Take 1 tablet (100 mg total) by mouth as needed. May repeat after 2 hours.  Maximum 2 tablets in 24 hours. 10 tablet 5   Vitamin D , Ergocalciferol , (DRISDOL ) 1.25 MG (50000 UNIT) CAPS capsule Take by mouth.     XOPENEX  HFA 45 MCG/ACT  inhaler INHALE 1 TO 2 PUFFS INTO THE LUNGS EVERY 8 HOURS AS NEEDED FOR WHEEZING OR SHORTNESS OF BREATH, USE 2 PUFFS 3 TIMES DAILY THEN BACK TO HOME REGIMEN 15 g 3   zonisamide  (ZONEGRAN ) 100 MG capsule Take 2 capsules (200 mg total) by mouth daily. 60 capsule 5   No current facility-administered medications on file prior to visit.        ROS:  All others reviewed and negative.  Objective        PE:  BP 130/82 (BP Location: Right Arm, Patient Position: Sitting, Cuff Size: Normal)   Pulse 84   Temp 98.8 F (37.1 C) (Oral)   Ht 5' 6 (1.676 m)   Wt 267 lb (121.1 kg)   LMP 07/18/2011   SpO2 98%   BMI 43.09 kg/m                 Constitutional: Pt appears in NAD               HENT: Head: NCAT.                Right Ear: External ear normal.                 Left Ear: External ear normal.                Eyes: . Pupils are equal, round, and reactive to light. Conjunctivae and EOM are normal               Nose: without d/c or deformity               Neck: Neck supple. Gross normal ROM               Cardiovascular: Normal rate and regular rhythm.                 Pulmonary/Chest: Effort normal and breath sounds without rales or wheezing.  Abd:  Soft, NT, ND, + BS, no organomegaly               Neurological: Pt is alert. At baseline orientation, motor grossly intact               Skin: Skin is warm. No rashes, no other new lesions, LE edema - none               Psychiatric: Pt behavior is normal without agitation   Micro: none  Cardiac tracings I have personally interpreted today:  none  Pertinent Radiological findings (summarize): none   Lab Results  Component Value Date   WBC 6.4 10/03/2024   HGB 12.9 10/03/2024   HCT 38.2 10/03/2024   PLT 251.0 10/03/2024   GLUCOSE 80 10/03/2024   CHOL 111 10/03/2024   TRIG 68.0 10/03/2024   HDL 42.30 10/03/2024   LDLDIRECT 135.0 07/05/2011   LDLCALC 55 10/03/2024   ALT 8 10/03/2024   AST 13 10/03/2024   NA 140 10/03/2024   K  4.1 10/03/2024   CL 105 10/03/2024   CREATININE 1.04 10/03/2024   BUN 10 10/03/2024   CO2 30 10/03/2024   TSH 1.82 03/13/2024   INR 1.3 11/17/2012   HGBA1C 5.7 10/03/2024   MICROALBUR <0.7 03/13/2024   Assessment/Plan:  Nicole Melendez is a 48 y.o. Black or African American [2] female with  has a past medical history of Allergic rhinitis, cause unspecified, Anemia, Anxiety, Arthritis, COPD (chronic obstructive pulmonary disease) (HCC), Depression, Diabetes mellitus type II, Essential hypertension (08/08/2015), GERD (gastroesophageal reflux disease), History of blood transfusion, Hyperlipidemia, Menorrhagia, Migraine, Morbid obesity (HCC), Otitis media (06/28/2015), Peptic ulcer disease, Pneumonia, Positive ANA (antinuclear antibody) (02/14/2012), Sarcoid, Sarcoidosis of lung, Shortness of breath, and Varicose veins with pain.  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 Vasovagal, but unclear, seen at ED nov 28; now for f/u echo, carotid u/s, and MRI brain  Followup: Return in about 6 months (around 04/03/2025).  Lynwood Rush, MD 10/05/2024 1:13 PM [facility]

## 2024-10-05 ENCOUNTER — Other Ambulatory Visit (HOSPITAL_COMMUNITY): Payer: Self-pay

## 2024-10-05 ENCOUNTER — Encounter: Payer: Self-pay | Admitting: Internal Medicine

## 2024-10-05 NOTE — Assessment & Plan Note (Signed)
 No overt bleeding, now for f/u iron and ferritin levels

## 2024-10-05 NOTE — Assessment & Plan Note (Signed)
 Lab Results  Component Value Date   LDLCALC 55 10/03/2024   Stable, pt to continue current statin crestor  40 mg qd

## 2024-10-05 NOTE — Assessment & Plan Note (Signed)
 Last vitamin D  Lab Results  Component Value Date   VD25OH 16.99 (L) 10/03/2024   Low, to start oral replacement

## 2024-10-05 NOTE — Assessment & Plan Note (Addendum)
 With neuropathy  Lab Results  Component Value Date   HGBA1C 5.7 10/03/2024   Stable, pt to continue current medical treatment novolog  12 u qid, ozemipc 1 mg weekly

## 2024-10-05 NOTE — Assessment & Plan Note (Signed)
?   Vasovagal, but unclear, seen at ED nov 28; now for f/u echo, carotid u/s, and MRI brain

## 2024-10-07 ENCOUNTER — Other Ambulatory Visit (HOSPITAL_COMMUNITY): Payer: Self-pay

## 2024-10-08 ENCOUNTER — Other Ambulatory Visit (HOSPITAL_COMMUNITY): Payer: Self-pay

## 2024-10-08 ENCOUNTER — Other Ambulatory Visit: Payer: Self-pay

## 2024-10-10 ENCOUNTER — Other Ambulatory Visit: Payer: Self-pay | Admitting: Internal Medicine

## 2024-10-10 ENCOUNTER — Encounter: Payer: Self-pay | Admitting: Internal Medicine

## 2024-10-10 DIAGNOSIS — N6489 Other specified disorders of breast: Secondary | ICD-10-CM

## 2024-10-16 ENCOUNTER — Encounter: Payer: Self-pay | Admitting: Internal Medicine

## 2024-10-16 ENCOUNTER — Encounter (HOSPITAL_COMMUNITY): Payer: Self-pay

## 2024-10-19 ENCOUNTER — Telehealth: Payer: Self-pay | Admitting: Emergency Medicine

## 2024-10-19 NOTE — Telephone Encounter (Signed)
 Unable to pend

## 2024-10-19 NOTE — Telephone Encounter (Signed)
 Copied from CRM #[id number]. Topic: Clinical - Medication Refill >> Oct 19, 2024  8:35 AM Russell PARAS wrote: Medication: mometasone -formoterol  (DULERA ) 200-5 MCG/ACT AERO  Has the patient contacted their pharmacy? Yes (Agent: If no, request that the patient contact the pharmacy for the refill. If patient does not wish to contact the pharmacy document the reason why and proceed with request.) (Agent: If yes, when and what did the pharmacy advise?)  This is the patient's preferred pharmacy:  [facility] [address] [place] [address] Phone: [phone] Fax: [phone]  Is this the correct pharmacy for this prescription? Yes If no, delete pharmacy and type the correct one.   Has the prescription been filled recently? Yes  Is the patient out of the medication? No  Has the patient been seen for an appointment in the last year OR does the patient have an upcoming appointment? Yes, 11/2023 w/[facility]  Can we respond through MyChart? Yes  Agent: Please be advised that Rx refills may take up to 3 business days. We ask that you follow-up with your pharmacy.

## 2024-10-22 ENCOUNTER — Other Ambulatory Visit (HOSPITAL_COMMUNITY): Payer: Self-pay

## 2024-10-22 ENCOUNTER — Other Ambulatory Visit

## 2024-10-22 MED ORDER — MOMETASONE FURO-FORMOTEROL FUM 200-5 MCG/ACT IN AERO
2.0000 | INHALATION_SPRAY | Freq: Two times a day (BID) | RESPIRATORY_TRACT | 6 refills | Status: DC
  Filled 2024-10-22: qty 13, 30d supply, fill #0
  Filled 2024-11-08: qty 13, fill #0
  Filled 2024-11-08: qty 13, 30d supply, fill #0

## 2024-10-22 NOTE — Telephone Encounter (Signed)
 Rx sent to pharmacy

## 2024-10-23 ENCOUNTER — Ambulatory Visit: Admitting: Adult Health

## 2024-10-24 ENCOUNTER — Encounter (HOSPITAL_COMMUNITY): Payer: Self-pay | Admitting: Radiology

## 2024-10-24 NOTE — Progress Notes (Signed)
 Just an FYI. We have made several attempts to contact this patient including sending a letter to schedule or reschedule their echocardiogram. We will be removing the patient from the echo WQ.  24-Oct-2024 sent letter due to vascular appt call no responses /dd  2024/10/24 lvm for patient to cb to schedule /dd  10-24-2024 No Auth Req CPT codes: 06693, R1070, 23623 [employer] bc  Sherren Tobias JONETTA Janet Olam B2025/12/31  VAS US  CAROTID & Echocardiogram Complete: I have called patient and [place] has clalled patient. No need to try again.  [employer] no auth required. Thanks     Thank you

## 2024-10-28 ENCOUNTER — Other Ambulatory Visit (HOSPITAL_COMMUNITY): Payer: Self-pay

## 2024-10-28 ENCOUNTER — Other Ambulatory Visit: Payer: Self-pay | Admitting: Internal Medicine

## 2024-10-29 ENCOUNTER — Other Ambulatory Visit: Payer: Self-pay

## 2024-10-29 ENCOUNTER — Other Ambulatory Visit (HOSPITAL_COMMUNITY): Payer: Self-pay

## 2024-10-29 MED FILL — Telmisartan-Hydrochlorothiazide Tab 40-12.5 MG: ORAL | 90 days supply | Qty: 90 | Fill #0 | Status: CN

## 2024-11-01 ENCOUNTER — Other Ambulatory Visit (HOSPITAL_COMMUNITY): Payer: Self-pay

## 2024-11-07 ENCOUNTER — Other Ambulatory Visit (HOSPITAL_COMMUNITY): Payer: Self-pay

## 2024-11-07 ENCOUNTER — Encounter: Payer: Self-pay | Admitting: Internal Medicine

## 2024-11-07 MED ORDER — ALPRAZOLAM 0.5 MG PO TABS
0.5000 mg | ORAL_TABLET | Freq: Two times a day (BID) | ORAL | 2 refills | Status: AC | PRN
  Filled 2024-11-07 – 2024-11-09 (×5): qty 60, 30d supply, fill #0

## 2024-11-08 ENCOUNTER — Other Ambulatory Visit (HOSPITAL_COMMUNITY): Payer: Self-pay

## 2024-11-09 ENCOUNTER — Other Ambulatory Visit: Payer: Self-pay

## 2024-11-09 ENCOUNTER — Other Ambulatory Visit (HOSPITAL_COMMUNITY): Payer: Self-pay

## 2024-11-09 MED FILL — Telmisartan-Hydrochlorothiazide Tab 40-12.5 MG: ORAL | 90 days supply | Qty: 90 | Fill #0 | Status: CN

## 2024-11-09 MED FILL — Telmisartan-Hydrochlorothiazide Tab 40-12.5 MG: ORAL | 90 days supply | Qty: 90 | Fill #0 | Status: AC

## 2024-11-12 ENCOUNTER — Other Ambulatory Visit (HOSPITAL_COMMUNITY): Payer: Self-pay

## 2024-11-15 ENCOUNTER — Encounter: Payer: Self-pay | Admitting: Internal Medicine

## 2024-11-15 ENCOUNTER — Ambulatory Visit: Admitting: Internal Medicine

## 2024-11-15 ENCOUNTER — Other Ambulatory Visit (HOSPITAL_COMMUNITY): Payer: Self-pay

## 2024-11-15 VITALS — BP 126/86 | HR 87 | Temp 98.7°F | Ht 66.0 in | Wt 260.0 lb

## 2024-11-15 DIAGNOSIS — D509 Iron deficiency anemia, unspecified: Secondary | ICD-10-CM

## 2024-11-15 DIAGNOSIS — Z794 Long term (current) use of insulin: Secondary | ICD-10-CM

## 2024-11-15 DIAGNOSIS — I10 Essential (primary) hypertension: Secondary | ICD-10-CM | POA: Diagnosis not present

## 2024-11-15 DIAGNOSIS — E559 Vitamin D deficiency, unspecified: Secondary | ICD-10-CM | POA: Diagnosis not present

## 2024-11-15 DIAGNOSIS — Z0000 Encounter for general adult medical examination without abnormal findings: Secondary | ICD-10-CM

## 2024-11-15 DIAGNOSIS — E7849 Other hyperlipidemia: Secondary | ICD-10-CM

## 2024-11-15 DIAGNOSIS — E1140 Type 2 diabetes mellitus with diabetic neuropathy, unspecified: Secondary | ICD-10-CM | POA: Diagnosis not present

## 2024-11-15 MED ORDER — SEMAGLUTIDE (2 MG/DOSE) 8 MG/3ML ~~LOC~~ SOPN
PEN_INJECTOR | SUBCUTANEOUS | 3 refills | Status: AC
  Filled 2024-11-15 – 2024-11-27 (×2): qty 9, 84d supply, fill #0

## 2024-11-15 NOTE — Assessment & Plan Note (Signed)
 No recent overt bleeding - for f/u iron lab, ferritin, cbc

## 2024-11-15 NOTE — Patient Instructions (Signed)
 Ok to increase the ozempic  to 2 mg weekly (not the 1.7 we discussed which is wegovy )  Please continue all other medications as before, and refills have been done if requested.  Please have the pharmacy call with any other refills you may need.  Please continue your efforts at being more active, low cholesterol diet, and weight control.  You are otherwise up to date with prevention measures today.  Please keep your appointments with your specialists as you may have planned  Your lab work was excellent!  Please make an Appointment to return in 6 months, or sooner if needed

## 2024-11-15 NOTE — Assessment & Plan Note (Signed)
 Lab Results  Component Value Date   LDLCALC 55 10/03/2024   Stable, pt to continue current statin crestor  40 mg qd

## 2024-11-15 NOTE — Assessment & Plan Note (Signed)
 With neuropathy  Lab Results  Component Value Date   HGBA1C 5.7 10/03/2024   Stable, pt to continue current medical treatment novolog  12 u qid, but with uncontrolled obesity - for increase ozempic  2 mg weekly

## 2024-11-15 NOTE — Assessment & Plan Note (Signed)
 Last vitamin D  Lab Results  Component Value Date   VD25OH 16.99 (L) 10/03/2024   Low, to start oral replacement

## 2024-11-15 NOTE — Assessment & Plan Note (Signed)
 Age and sex appropriate education and counseling updated with regular exercise and diet Referrals for preventative services - none needed Immunizations addressed - decliens tdap and hep b for now Smoking counseling  - none needed Evidence for depression or other mood disorder - none significant Most recent labs reviewed. I have personally reviewed and have noted: 1) the patient's medical and social history 2) The patient's current medications and supplements 3) The patient's height, weight, and BMI have been recorded in the chart

## 2024-11-15 NOTE — Assessment & Plan Note (Signed)
 BP Readings from Last 3 Encounters:  11/15/24 126/86  10/03/24 130/82  09/21/24 96/65   Stable, pt to continue medical treatment micardis  hct 40 12.5 qd

## 2024-11-15 NOTE — Progress Notes (Signed)
 Patient ID: Nicole Melendez, female   DOB: January 22, 1976, 49 y.o.   MRN: [id number]         Chief Complaint:: wellness exam and DM, htn, hld, low vit d       HPI:  Nicole Melendez is a 49 y.o. female here for wellness exam; decliens tdap and hep B vax, o/w up to date                        Also wt down from peak 330 but plateued weight.  Pt denies chest pain, increased sob or doe, wheezing, orthopnea, PND, increased LE swelling, palpitations, dizziness or syncope.   Pt denies polydipsia, polyuria, or new focal neuro s/s.    Pt denies fever, wt loss, night sweats, loss of appetite, or other constitutional symptoms     Wt Readings from Last 3 Encounters:  11/15/24 260 lb (117.9 kg)  10/03/24 267 lb (121.1 kg)  09/21/24 257 lb 15 oz (117 kg)   BP Readings from Last 3 Encounters:  11/15/24 126/86  10/03/24 130/82  09/21/24 96/65   Immunization History  Administered Date(s) Administered   Influenza Split 07/07/2011, 07/21/2012   Influenza, Seasonal, Injecte, Preservative Fre 07/20/2023   Influenza,inj,Quad PF,6+ Mos 01/04/2014, 07/08/2014, 07/02/2015, 06/15/2017, 07/18/2018, 07/27/2019   Influenza-Unspecified 06/15/2017, 11/14/2024   PFIZER(Purple Top)SARS-COV-2 Vaccination 01/26/2020, 02/23/2020   PNEUMOCOCCAL CONJUGATE-20 07/20/2023   Pneumococcal Conjugate-13 10/28/2015   Pneumococcal Polysaccharide-23 11/26/2015   Td 10/25/1997   Tdap 01/03/2013   Zoster Recombinant(Shingrix) 11/14/2024   Health Maintenance Due  Topic Date Due   Hepatitis B Vaccines 19-59 Average Risk (1 of 3 - 19+ 3-dose series) Never done   DTaP/Tdap/Td (3 - Td or Tdap) 01/04/2023   Lung Cancer Screening  06/02/2024      Past Medical History:  Diagnosis Date   Allergic rhinitis, cause unspecified    Anemia    Anxiety    Arthritis    COPD (chronic obstructive pulmonary disease) (HCC)    Depression    Diabetes mellitus type II    steroid related, patient states im no longer diabetic   Essential  hypertension 08/08/2015   GERD (gastroesophageal reflux disease)    History of blood transfusion    Hyperlipidemia    Menorrhagia    Migraine    Morbid obesity (HCC)    Otitis media 06/28/2015   Peptic ulcer disease    Pneumonia    Positive ANA (antinuclear antibody) 02/14/2012   Sarcoid    including hand per rheumatology-Dr. Mai   Sarcoidosis of lung    Shortness of breath    on exertion   Varicose veins with pain    Past Surgical History:  Procedure Laterality Date   ABDOMINAL HYSTERECTOMY     COLONOSCOPY WITH PROPOFOL  N/A 10/19/2016   Procedure: COLONOSCOPY WITH PROPOFOL ;  Surgeon: Gustav LULLA Mcgee, MD;  Location: [facility];  Service: Endoscopy;  Laterality: N/A;   ESOPHAGOGASTRODUODENOSCOPY (EGD) WITH PROPOFOL  N/A 10/19/2016   Procedure: ESOPHAGOGASTRODUODENOSCOPY (EGD) WITH PROPOFOL ;  Surgeon: Gustav LULLA Mcgee, MD;  Location: [facility];  Service: Endoscopy;  Laterality: N/A;   HERNIA MESH REMOVAL  02/2013   HERNIA REPAIR     KNEE ARTHROSCOPY Left 01/15/2022   Procedure: ARTHROSCOPY KNEE;  Surgeon: Kay Kemps, MD;  Location: [facility];  Service: Orthopedics;  Laterality: Left;   uterine ablation  03/2010   WISDOM TOOTH EXTRACTION      reports that she quit smoking about 12 years ago. Her  smoking use included cigarettes. She started smoking about 32 years ago. She has a 20 pack-year smoking history. She has never used smokeless tobacco. She reports that she does not drink alcohol and does not use drugs. family history includes Allergies in her mother; Bone cancer in her maternal aunt; Breast cancer in her maternal aunt; Diabetes in her father and mother; Heart attack in her mother; Heart disease in her father; Hypertension in an other family member; Lung cancer in her maternal aunt; Ovarian cancer in her maternal aunt; Rheum arthritis in her father; Stroke in her father and maternal uncle. Allergies[1] Medications Ordered Prior to Encounter[2]      ROS:  All others  reviewed and negative.  Objective        PE:  BP 126/86 (BP Location: Left Arm, Patient Position: Sitting, Cuff Size: Normal)   Pulse 87   Temp 98.7 F (37.1 C) (Oral)   Ht 5' 6 (1.676 m)   Wt 260 lb (117.9 kg)   LMP 07/18/2011   SpO2 97%   BMI 41.97 kg/m                 Constitutional: Pt appears in NAD               HENT: Head: NCAT.                Right Ear: External ear normal.                 Left Ear: External ear normal.                Eyes: . Pupils are equal, round, and reactive to light. Conjunctivae and EOM are normal               Nose: without d/c or deformity               Neck: Neck supple. Gross normal ROM               Cardiovascular: Normal rate and regular rhythm.                 Pulmonary/Chest: Effort normal and breath sounds without rales or wheezing.                Abd:  Soft, NT, ND, + BS, no organomegaly               Neurological: Pt is alert. At baseline orientation, motor grossly intact               Skin: Skin is warm. No rashes, no other new lesions, LE edema - none               Psychiatric: Pt behavior is normal without agitation   Micro: none  Cardiac tracings I have personally interpreted today:  none  Pertinent Radiological findings (summarize): none   Lab Results  Component Value Date   WBC 6.4 10/03/2024   HGB 12.9 10/03/2024   HCT 38.2 10/03/2024   PLT 251.0 10/03/2024   GLUCOSE 80 10/03/2024   CHOL 111 10/03/2024   TRIG 68.0 10/03/2024   HDL 42.30 10/03/2024   LDLDIRECT 135.0 07/05/2011   LDLCALC 55 10/03/2024   ALT 8 10/03/2024   AST 13 10/03/2024   NA 140 10/03/2024   K 4.1 10/03/2024   CL 105 10/03/2024   CREATININE 1.04 10/03/2024   BUN 10 10/03/2024   CO2 30 10/03/2024   TSH 1.82 03/13/2024   INR  1.3 11/17/2012   HGBA1C 5.7 10/03/2024   MICROALBUR <0.7 03/13/2024   Assessment/Plan:  Nicole Melendez is a 49 y.o. Black or African American [2] female with  has a past medical history of Allergic rhinitis, cause  unspecified, Anemia, Anxiety, Arthritis, COPD (chronic obstructive pulmonary disease) (HCC), Depression, Diabetes mellitus type II, Essential hypertension (08/08/2015), GERD (gastroesophageal reflux disease), History of blood transfusion, Hyperlipidemia, Menorrhagia, Migraine, Morbid obesity (HCC), Otitis media (06/28/2015), Peptic ulcer disease, Pneumonia, Positive ANA (antinuclear antibody) (02/14/2012), Sarcoid, Sarcoidosis of lung, Shortness of breath, and Varicose veins with pain.  Encounter for well adult exam with abnormal findings Age and sex appropriate education and counseling updated with regular exercise and diet Referrals for preventative services - none needed Immunizations addressed - decliens tdap and hep b for now Smoking counseling  - none needed Evidence for depression or other mood disorder - none significant Most recent labs reviewed. 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  Lab Results  Component Value Date   HGBA1C 5.7 10/03/2024   Stable, pt to continue current medical treatment novolog  12 u qid, but with uncontrolled obesity - for increase ozempic  2 mg weekly   Essential hypertension BP Readings from Last 3 Encounters:  11/15/24 126/86  10/03/24 130/82  09/21/24 96/65   Stable, pt to continue medical treatment micardis  hct 40 12.5 qd   Hyperlipidemia Lab Results  Component Value Date   LDLCALC 55 10/03/2024   Stable, pt to continue current statin crestor  40 mg qd   Iron deficiency anemia No recent overt bleeding - for f/u iron lab, ferritin, cbc  Followup: Return in about 6 months (around 05/15/2025).  Lynwood Rush, MD 11/15/2024 7:47 PM [facility]      [1]  Allergies Allergen Reactions   Azithromycin Hives    (z pak) hives   Bee Venom Anaphylaxis   Flagyl  [Metronidazole ] Hives and Shortness Of Breath   Penicillins Shortness Of Breath and Swelling    Has patient had a PCN reaction causing immediate rash, facial/tongue/throat swelling, SOB or lightheadedness with hypotension: Yes Has patient had a PCN reaction causing severe rash involving mucus membranes or skin necrosis: No Has patient had a PCN reaction that required hospitalization No Has patient had a PCN reaction occurring within the last 10 years: No If all of the above answers are NO, then may proceed with Cephalosporin use.  REACTION: swelling and difficulty breathing   Shellfish Allergy Anaphylaxis   Klonopin  [Clonazepam ] Other (See Comments)    Memory difficulty   Cephalexin Hives and Swelling   Cephalosporins Hives  [2]  Current Outpatient Medications on File Prior to Visit  Medication Sig Dispense Refill   acetaminophen  (TYLENOL ) 650 MG CR tablet Take 650-1,300 mg by mouth every 8 (eight) hours as needed for pain.     ALPRAZolam  (XANAX ) 0.5 MG tablet Take 1 tablet (0.5 mg total) by mouth 2 (two) times daily as needed for anxiety. 60 tablet 2   azelastine  (ASTELIN ) 0.1 % nasal spray Place 2 sprays into both nostrils 2 (two) times daily. Use in each nostril as directed 30 mL 12   azelastine  (ASTELIN ) 0.1 % nasal spray Place 2 sprays into both nostrils 2 (two) times daily. 30 mL 12  BD PEN NEEDLE NANO 2ND GEN 32G X 4 MM MISC USE AS DIRECTED THREE TIMES DAILY 300 each 3   Blood Glucose Monitoring Suppl (ONETOUCH VERIO) w/Device KIT Use as directed daily E11.9 1 kit 0   escitalopram  (LEXAPRO ) 10 MG tablet Take 1 tablet (10 mg total) by mouth daily. 90 tablet 3   escitalopram  (LEXAPRO ) 10 MG tablet Take 1 tablet (10 mg total) by mouth daily. 90 tablet 3   fluticasone  (FLONASE ) 50 MCG/ACT nasal spray Place 2 sprays into both nostrils daily. 16 g 6   fluticasone  (FLONASE )  50 MCG/ACT nasal spray Place 2 sprays into both nostrils daily. 16 g 6   gabapentin  (NEURONTIN ) 300 MG capsule Take 3 capsules (900 mg total) by mouth 3 (three) times daily. 270 capsule 1   gabapentin  (NEURONTIN ) 300 MG capsule Take 3 capsules (900 mg total) by mouth 3 (three) times daily. 270 capsule 1   gabapentin  (NEURONTIN ) 300 MG capsule Take 3 capsules (900 mg total) by mouth 3 (three) times daily. 270 capsule 1   Galcanezumab -gnlm (EMGALITY ) 120 MG/ML SOAJ Inject 120 mg into the skin every 28 (twenty-eight) days. 1.12 mL 5   Galcanezumab -gnlm (EMGALITY ) 120 MG/ML SOAJ Inject 120 mg into the skin every 28 days 1 mL 5   glucose blood (ONETOUCH VERIO) test strip USE AS DIRECTED THREE TIMES DAILY E11.9 300 strip 11   insulin  aspart (NOVOLOG  FLEXPEN) 100 UNIT/ML FlexPen ADMINISTER 12 UNITS UNDER THE SKIN FOUR TIMES DAILY 15 mL 2   levalbuterol  (XOPENEX  HFA) 45 MCG/ACT inhaler Inhale 1-2 puffs into the lungs every 8 (eight) hours as needed. 15 g 2   mometasone -formoterol  (DULERA ) 200-5 MCG/ACT AERO Inhale 2 puffs into the lungs in the morning and at bedtime. 13 g 6   naloxone  (NARCAN ) nasal spray 4 mg/0.1 mL SMARTSIG:1 Spray(s) Both Nares PRN     nystatin  (MYCOSTATIN ) 100000 UNIT/ML suspension Take by mouth.     ondansetron  (ZOFRAN ) 4 MG tablet Take 1 tablet (4 mg total) by mouth every 8 (eight) hours as needed. 20 tablet 5   oxyCODONE -acetaminophen  (PERCOCET) 10-325 MG tablet Take 1 tablet by mouth 5 (five) times daily as needed. Pt requests change of manufacturer     oxyCODONE -acetaminophen  (PERCOCET) 10-325 MG tablet Take 1 tablet by mouth 5 (five) times daily as needed. 150 tablet 0   pantoprazole  (PROTONIX ) 40 MG tablet Take 1 tablet (40 mg total) by mouth 2 (two) times daily before a meal 60 tablet 5   potassium chloride  (KLOR-CON  10) 10 MEQ tablet Take 2 tablets (20 mEq total) by mouth daily. 180 tablet 3   potassium chloride  SA (KLOR-CON  M) 20 MEQ tablet      rosuvastatin  (CRESTOR ) 40 MG  tablet TAKE 1 TABLET BY MOUTH DAILY 90 tablet 3   telmisartan -hydrochlorothiazide  (MICARDIS  HCT) 40-12.5 MG tablet TAKE 1 TABLET BY MOUTH DAILY 90 tablet 3   telmisartan -hydrochlorothiazide  (MICARDIS  HCT) 40-12.5 MG tablet Take 1 tablet by mouth daily. 90 tablet 3   TURMERIC PO Take 1 tablet by mouth daily.     Ubrogepant  (UBRELVY ) 100 MG TABS Take 1 tablet (100 mg total) by mouth as needed. May repeat after 2 hours.  Maximum 2 tablets in 24 hours. 10 tablet 5   Vitamin D , Ergocalciferol , (DRISDOL ) 1.25 MG (50000 UNIT) CAPS capsule Take by mouth.     XOPENEX  HFA 45 MCG/ACT inhaler INHALE 1 TO 2 PUFFS INTO THE LUNGS EVERY 8 HOURS AS NEEDED FOR WHEEZING OR SHORTNESS OF BREATH, USE 2 PUFFS  3 TIMES DAILY THEN BACK TO HOME REGIMEN 15 g 3   zonisamide  (ZONEGRAN ) 100 MG capsule Take 2 capsules (200 mg total) by mouth daily. 60 capsule 5   No current facility-administered medications on file prior to visit.

## 2024-11-16 ENCOUNTER — Other Ambulatory Visit (HOSPITAL_COMMUNITY): Payer: Self-pay

## 2024-11-16 ENCOUNTER — Other Ambulatory Visit: Payer: Self-pay

## 2024-11-21 ENCOUNTER — Ambulatory Visit (HOSPITAL_COMMUNITY)
Admission: RE | Admit: 2024-11-21 | Discharge: 2024-11-21 | Disposition: A | Discharge status: Home / Self Care | Source: Ambulatory Visit | Attending: Internal Medicine | Admitting: Internal Medicine

## 2024-11-21 ENCOUNTER — Ambulatory Visit: Payer: Self-pay | Admitting: Internal Medicine

## 2024-11-21 DIAGNOSIS — R55 Syncope and collapse: Secondary | ICD-10-CM | POA: Insufficient documentation

## 2024-11-23 ENCOUNTER — Encounter: Payer: Self-pay | Admitting: Internal Medicine

## 2024-11-26 ENCOUNTER — Encounter

## 2024-11-26 ENCOUNTER — Other Ambulatory Visit

## 2024-11-26 ENCOUNTER — Ambulatory Visit: Admitting: Adult Health

## 2024-11-27 ENCOUNTER — Other Ambulatory Visit (HOSPITAL_COMMUNITY): Payer: Self-pay

## 2024-11-27 ENCOUNTER — Other Ambulatory Visit: Payer: Self-pay

## 2024-11-27 ENCOUNTER — Other Ambulatory Visit: Payer: Self-pay | Admitting: Internal Medicine

## 2024-11-27 ENCOUNTER — Encounter: Payer: Self-pay | Admitting: Pharmacist

## 2024-11-27 MED ORDER — ROSUVASTATIN CALCIUM 40 MG PO TABS
40.0000 mg | ORAL_TABLET | Freq: Every day | ORAL | 3 refills | Status: AC
  Filled 2024-11-27: qty 90, 90d supply, fill #0

## 2024-11-28 ENCOUNTER — Other Ambulatory Visit

## 2024-11-28 ENCOUNTER — Other Ambulatory Visit (HOSPITAL_COMMUNITY): Payer: Self-pay

## 2024-11-28 ENCOUNTER — Other Ambulatory Visit: Payer: Self-pay

## 2024-11-29 ENCOUNTER — Other Ambulatory Visit: Payer: Self-pay

## 2024-11-30 ENCOUNTER — Encounter: Payer: Self-pay | Admitting: Emergency Medicine

## 2024-11-30 ENCOUNTER — Ambulatory Visit

## 2024-11-30 ENCOUNTER — Other Ambulatory Visit (HOSPITAL_COMMUNITY): Payer: Self-pay

## 2024-11-30 ENCOUNTER — Ambulatory Visit: Admitting: Emergency Medicine

## 2024-11-30 VITALS — BP 100/50 | HR 85 | Ht 67.0 in | Wt 264.2 lb

## 2024-11-30 DIAGNOSIS — J301 Allergic rhinitis due to pollen: Secondary | ICD-10-CM

## 2024-11-30 DIAGNOSIS — D869 Sarcoidosis, unspecified: Secondary | ICD-10-CM

## 2024-11-30 DIAGNOSIS — J449 Chronic obstructive pulmonary disease, unspecified: Secondary | ICD-10-CM

## 2024-11-30 DIAGNOSIS — D860 Sarcoidosis of lung: Secondary | ICD-10-CM

## 2024-11-30 DIAGNOSIS — G4733 Obstructive sleep apnea (adult) (pediatric): Secondary | ICD-10-CM

## 2024-11-30 DIAGNOSIS — R42 Dizziness and giddiness: Secondary | ICD-10-CM

## 2024-11-30 MED ORDER — MOMETASONE FURO-FORMOTEROL FUM 200-5 MCG/ACT IN AERO
2.0000 | INHALATION_SPRAY | Freq: Two times a day (BID) | RESPIRATORY_TRACT | 6 refills | Status: AC
  Filled 2024-11-30: qty 13, 30d supply, fill #0
  Filled ????-??-??: fill #0

## 2024-11-30 MED ORDER — LEVOCETIRIZINE DIHYDROCHLORIDE 5 MG PO TABS
5.0000 mg | ORAL_TABLET | Freq: Every evening | ORAL | 11 refills | Status: AC
  Filled 2024-11-30: qty 30, 30d supply, fill #0

## 2024-11-30 MED ORDER — LEVALBUTEROL TARTRATE 45 MCG/ACT IN AERO
1.0000 | INHALATION_SPRAY | Freq: Three times a day (TID) | RESPIRATORY_TRACT | 6 refills | Status: AC | PRN
  Filled 2024-11-30: qty 15, 30d supply, fill #0

## 2024-11-30 NOTE — Assessment & Plan Note (Signed)
 Overall clinically stable.  She had reassuring PFTs in 06/2023, has not had imaging since 05/2023 (normal CT at that time).  I will check a chest x-ray today for surveillance.

## 2024-11-30 NOTE — Assessment & Plan Note (Signed)
 Doing well with Dulera .  Good compliance and tolerating, benefiting.  Plan to continue.  She uses Xopenex  as her rescue inhaler.  Uses it rarely.  We will refill both.

## 2024-11-30 NOTE — Assessment & Plan Note (Signed)
 Off steroid nasal spray, Astelin  due to nose irritation.  We will try her on Xyzal  as the over-the-counter antihistamines have been ineffective.

## 2024-11-30 NOTE — Assessment & Plan Note (Signed)
 Mild disease, has not required CPAP

## 2024-11-30 NOTE — Progress Notes (Signed)
" ° °  Subjective:    Patient ID: Nicole Melendez, female    DOB: 1976-10-21, 49 y.o.   MRN: [id number]  HPI   ROV 11/30/2024 --follow-up is a 49 year old woman with a history of former tobacco use, sarcoidosis and associated COPD/asthma.  She has mild (subclinical) OSA not on CPAP.  Also allergic rhinitis her last CT scan of the chest was done in August 2024 and this was normal.  She is off nasal allergy sprays. Dulera .  She has been overall ok, but she did have an episode of syncope in 07/2024, ? Hypoglycemic, BP was low in UC next day. She has some occasional dizziness.  She is going to get a new PCP as Dr Norleen has retired.  She is reliable w her Dulera . Has xopenex , uses rarely. Minimal cough. No SOB.    Review of Systems  As per HPI     Objective:   Physical Exam  Vitals:   11/30/24 1008  BP: (!) 100/50  Pulse: 85  SpO2: 99%  Weight: 264 lb 3.2 oz (119.8 kg)  Height: 5' 7 (1.702 m)    Gen: Pleasant, obese woman, in no distress,  normal affect  ENT: No lesions,  mouth clear,  oropharynx clear, no postnasal drip  Neck: No JVD, no stridor  Lungs: No use of accessory muscles, clear without rales or rhonchi, soft end expiratory wheeze on forced expiration Cardiovascular: RRR, heart sounds normal, no murmur or gallops, no peripheral edema  Musculoskeletal: No deformities, no cyanosis or clubbing  Neuro: alert, non focal  Skin: Warm, no lesions or rashes        Assessment & Plan:  Sarcoidosis of lung Overall clinically stable.  She had reassuring PFTs in 06/2023, has not had imaging since 05/2023 (normal CT at that time).  I will check a chest x-ray today for surveillance.  Allergic rhinitis Off steroid nasal spray, Astelin  due to nose irritation.  We will try her on Xyzal  as the over-the-counter antihistamines have been ineffective.  COPD (chronic obstructive pulmonary disease) (HCC) Doing well with Dulera .  Good compliance and tolerating, benefiting.  Plan to continue.   She uses Xopenex  as her rescue inhaler.  Uses it rarely.  We will refill both.  OSA (obstructive sleep apnea) Mild disease, has not required CPAP  Dizziness Possibly associated with hypoglycemia.  She does keep track of her sugars.  She is going to revisit with her primary provider.   I personally spent a total of 24 minutes in the care of the patient today including preparing to see the patient, getting/reviewing separately obtained history, performing a medically appropriate exam/evaluation, counseling and educating, placing orders, documenting clinical information in the EHR, independently interpreting results, and communicating results.   Lamar Chris, MD, PhD 11/30/2024, 10:36 AM [facility] [phone] or if no answer [phone]  "

## 2024-11-30 NOTE — Assessment & Plan Note (Signed)
 Possibly associated with hypoglycemia.  She does keep track of her sugars.  She is going to revisit with her primary provider.

## 2024-11-30 NOTE — Patient Instructions (Signed)
 We will perform a surveillance chest x-ray today to follow your sarcoidosis. Please continue Dulera  2 puffs twice a day.  Rinse and gargle after using. Keep your Xopenex  available to use 2 puffs if needed for shortness of breath, chest tightness, wheezing. We will hold off on restarting your allergy nasal sprays since they had given you nasal irritation Try starting levocetirizine (Xyzal ) once daily.  A prescription will be sent for this. Follow-up with your primary care physician regarding your episodes of low blood sugar and dizziness. Follow with Dr. Shelah in 1 year.  Call if you have any problems so we can see you sooner.

## 2024-12-04 ENCOUNTER — Other Ambulatory Visit

## 2024-12-04 ENCOUNTER — Encounter

## 2025-02-06 ENCOUNTER — Ambulatory Visit: Admitting: Family Medicine

## 2025-02-28 ENCOUNTER — Ambulatory Visit: Admitting: Neurology

## 2025-05-07 ENCOUNTER — Ambulatory Visit: Admitting: Family Medicine
